# Patient Record
Sex: Male | Born: 1950 | Race: White | Hispanic: No | Marital: Single | Smoking: Former smoker
Health system: Southern US, Community
[De-identification: ages and names within clinical notes are randomized; demographics above are authoritative.]

## PROBLEM LIST (undated history)

## (undated) DIAGNOSIS — I4891 Unspecified atrial fibrillation: Secondary | ICD-10-CM

## (undated) DIAGNOSIS — I10 Essential (primary) hypertension: Secondary | ICD-10-CM

## (undated) DIAGNOSIS — E109 Type 1 diabetes mellitus without complications: Secondary | ICD-10-CM

## (undated) DIAGNOSIS — F1910 Other psychoactive substance abuse, uncomplicated: Secondary | ICD-10-CM

## (undated) DIAGNOSIS — M1990 Unspecified osteoarthritis, unspecified site: Secondary | ICD-10-CM

## (undated) DIAGNOSIS — D649 Anemia, unspecified: Secondary | ICD-10-CM

## (undated) DIAGNOSIS — I639 Cerebral infarction, unspecified: Secondary | ICD-10-CM

## (undated) DIAGNOSIS — N189 Chronic kidney disease, unspecified: Secondary | ICD-10-CM

## (undated) DIAGNOSIS — J189 Pneumonia, unspecified organism: Secondary | ICD-10-CM

## (undated) DIAGNOSIS — C182 Malignant neoplasm of ascending colon: Secondary | ICD-10-CM

## (undated) DIAGNOSIS — N402 Nodular prostate without lower urinary tract symptoms: Secondary | ICD-10-CM

## (undated) DIAGNOSIS — I509 Heart failure, unspecified: Secondary | ICD-10-CM

## (undated) HISTORY — DX: Unspecified atrial fibrillation: I48.91

## (undated) HISTORY — DX: Pneumonia, unspecified organism: J18.9

## (undated) HISTORY — DX: Type 1 diabetes mellitus without complications: E10.9

## (undated) HISTORY — DX: Nodular prostate without lower urinary tract symptoms: N40.2

## (undated) HISTORY — DX: Malignant neoplasm of ascending colon: C18.2

## (undated) HISTORY — DX: Other psychoactive substance abuse, uncomplicated: F19.10

## (undated) HISTORY — PX: OTHER SURGICAL HISTORY: SHX169

## (undated) HISTORY — PX: TONSILLECTOMY: SUR1361

## (undated) HISTORY — PX: PORT-A-CATH REMOVAL: SHX5289

## (undated) HISTORY — DX: Anemia, unspecified: D64.9

## (undated) HISTORY — DX: Heart failure, unspecified: I50.9

---
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
## 2000-05-23 ENCOUNTER — Ambulatory Visit (HOSPITAL_COMMUNITY): Admission: RE | Admit: 2000-05-23 | Discharge: 2000-05-23 | Payer: Self-pay | Admitting: Ophthalmology

## 2005-03-07 ENCOUNTER — Ambulatory Visit (HOSPITAL_COMMUNITY): Admission: RE | Admit: 2005-03-07 | Discharge: 2005-03-07 | Payer: Self-pay | Admitting: Internal Medicine

## 2005-03-07 ENCOUNTER — Ambulatory Visit: Payer: Self-pay | Admitting: Internal Medicine

## 2007-04-19 HISTORY — PX: FINGER SURGERY: SHX640

## 2011-05-10 ENCOUNTER — Encounter: Payer: Self-pay | Admitting: Gastroenterology

## 2011-05-10 ENCOUNTER — Ambulatory Visit (INDEPENDENT_AMBULATORY_CARE_PROVIDER_SITE_OTHER): Payer: BC Managed Care – PPO | Admitting: Gastroenterology

## 2011-05-10 VITALS — BP 118/70 | HR 80 | Ht 75.0 in | Wt 166.0 lb

## 2011-05-10 DIAGNOSIS — H409 Unspecified glaucoma: Secondary | ICD-10-CM | POA: Insufficient documentation

## 2011-05-10 DIAGNOSIS — R195 Other fecal abnormalities: Secondary | ICD-10-CM

## 2011-05-10 DIAGNOSIS — E1065 Type 1 diabetes mellitus with hyperglycemia: Secondary | ICD-10-CM | POA: Insufficient documentation

## 2011-05-10 MED ORDER — PEG-KCL-NACL-NASULF-NA ASC-C 100 G PO SOLR: 1.0000 | Freq: Once | ORAL | Status: DC

## 2011-05-10 NOTE — Progress Notes (Signed)
History of Present Illness: [name] is a pleasant 61 year old white male with history of type 1 diabetes and glaucoma referred at the request of Dr. Christell Constant for evaluation of anemia and Hemoccult-positive stool. For the past 2 months he has been complaining of mild lower abdominal discomfort. At evaluation in Dr. Kathi Der office he was noted to be Hemoccult-positive. Hemoglobin was 10.9 and MCV 79. Ferritin level was 40 and percent saturation 12. He has noted some irregularity of his bowels. He denies melena or hematochezia. He is on no gastric irritants. He has no prior GI complaints.    Past Medical History  Diagnosis Date  . Diabetes mellitus   . Anemia   . Glaucoma   . Pneumonia   . Allergic rhinitis   . Prostate nodule    Past Surgical History  Procedure Date  . Tonsillectomy   . Finger surgery     right middle   family history includes Breast cancer in his mother; Colon polyps in his father; Diabetes in his father; Lung cancer in his father; and Pancreatitis in his mother. Current Outpatient Prescriptions  Medication Sig Dispense Refill  . Ascorbic Acid (VITAMIN C PO) Take 1 tablet by mouth daily.      . Aspirin (ECOTRIN PO) Take 1 tablet by mouth daily.      . brimonidine-timolol (COMBIGAN) 0.2-0.5 % ophthalmic solution Place 1 drop into both eyes daily.      . insulin aspart (NOVOLOG) 100 UNIT/ML injection Inject into the skin. Take 1 unit for ever 6 grams of carbs before each meal      . insulin glargine (LANTUS) 100 UNIT/ML injection Inject 22 Units into the skin at bedtime.      Marland Kitchen latanoprost (XALATAN) 0.005 % ophthalmic solution Place 1 drop into both eyes at bedtime.      Marland Kitchen levocetirizine (XYZAL) 5 MG tablet Take 5 mg by mouth every evening.      Marland Kitchen LISINOPRIL PO Take 1 tablet by mouth daily.      . Misc. Devices (NASADOCK) MISC Place 1 spray into both nostrils daily.      . Multiple Vitamin (MULTIVITAMIN) tablet Take 1 tablet by mouth daily.      Marland Kitchen SIMVASTATIN PO Take  1 tablet by mouth daily.      Marland Kitchen VITAMIN D, CHOLECALCIFEROL, PO Take 1 tablet by mouth daily.       Allergies as of 05/10/2011  . (No Known Allergies)    reports that he quit smoking about 18 years ago. He has never used smokeless tobacco. He reports that he drinks alcohol. He reports that he uses illicit drugs.     Review of Systems: Pertinent positive and negative review of systems were noted in the above HPI section. All other review of systems were otherwise negative.  Vital signs were reviewed in today's medical record Physical Exam: General: Well developed , well nourished, no acute distress Head: Normocephalic and atraumatic Eyes:  sclerae anicteric, EOMI Ears: Normal auditory acuity Mouth: No deformity or lesions Neck: Supple, no masses or thyromegaly Lungs: Clear throughout to auscultation Heart: Regular rate and rhythm; no murmurs, rubs or bruits Abdomen: Soft, non tender and non distended. No masses, hepatosplenomegaly or hernias noted. Normal Bowel sounds Rectal:deferred Musculoskeletal: Symmetrical with no gross deformities  Skin: No lesions on visible extremities Pulses:  Normal pulses noted Extremities: No clubbing, cyanosis, edema or deformities noted; there are chronic venous stasis changes in his lower extremities Neurological: Alert oriented x 4, grossly nonfocal Cervical  Nodes:  No significant cervical adenopathy Inguinal Nodes: No significant inguinal adenopathy Psychological:  Alert and cooperative. Normal mood and affect

## 2011-05-10 NOTE — Patient Instructions (Signed)
Your Colonoscopy is scheduled on 05/16/2011 at 2pm Separate instructions have been given We have sent your MoviPrep to your pharmacy We have instructed you on your diabetic medications  Colonoscopy A colonoscopy is an exam to evaluate your entire colon. In this exam, your colon is cleansed. A long fiberoptic tube is inserted through your rectum and into your colon. The fiberoptic scope (endoscope) is a long bundle of enclosed and very flexible fibers. These fibers transmit light to the area examined and send images from that area to your caregiver. Discomfort is usually minimal. You may be given a drug to help you sleep (sedative) during or prior to the procedure. This exam helps to detect lumps (tumors), polyps, inflammation, and areas of bleeding. Your caregiver may also take a small piece of tissue (biopsy) that will be examined under a microscope. LET YOUR CAREGIVER KNOW ABOUT:   Allergies to food or medicine.   Medicines taken, including vitamins, herbs, eyedrops, over-the-counter medicines, and creams.   Use of steroids (by mouth or creams).   Previous problems with anesthetics or numbing medicines.   History of bleeding problems or blood clots.   Previous surgery.   Other health problems, including diabetes and kidney problems.   Possibility of pregnancy, if this applies.  BEFORE THE PROCEDURE   A clear liquid diet may be required for 2 days before the exam.   Ask your caregiver about changing or stopping your regular medications.   Liquid injections (enemas) or laxatives may be required.   A large amount of electrolyte solution may be given to you to drink over a short period of time. This solution is used to clean out your colon.   You should be present 60 minutes prior to your procedure or as directed by your caregiver.  AFTER THE PROCEDURE   If you received a sedative or pain relieving medication, you will need to arrange for someone to drive you home.   Occasionally,  there is a little blood passed with the first bowel movement. Do not be concerned.  FINDING OUT THE RESULTS OF YOUR TEST Not all test results are available during your visit. If your test results are not back during the visit, make an appointment with your caregiver to find out the results. Do not assume everything is normal if you have not heard from your caregiver or the medical facility. It is important for you to follow up on all of your test results. HOME CARE INSTRUCTIONS   It is not unusual to pass moderate amounts of gas and experience mild abdominal cramping following the procedure. This is due to air being used to inflate your colon during the exam. Walking or a warm pack on your belly (abdomen) may help.   You may resume all normal meals and activities after sedatives and medicines have worn off.   Only take over-the-counter or prescription medicines for pain, discomfort, or fever as directed by your caregiver. Do not use aspirin or blood thinners if a biopsy was taken. Consult your caregiver for medicine usage if biopsies were taken.  SEEK IMMEDIATE MEDICAL CARE IF:   You have a fever.   You pass large blood clots or fill a toilet with blood following the procedure. This may also occur 10 to 14 days following the procedure. This is more likely if a biopsy was taken.   You develop abdominal pain that keeps getting worse and cannot be relieved with medicine.  Document Released: 04/01/2000 Document Revised: 12/15/2010 Document Reviewed: 11/15/2007  ExitCare Patient Information 2012 [place].

## 2011-05-10 NOTE — Assessment & Plan Note (Addendum)
Hemoccult-positive stool, probable iron deficiency anemia and lower abdominal pain suggest chronic GI bleeding, possibly from a neoplasm  Recommendations #1 colonoscopy #2 upper endoscopy if colonoscopy is not diagnostic

## 2011-05-16 ENCOUNTER — Other Ambulatory Visit: Payer: Self-pay | Admitting: Gastroenterology

## 2011-05-16 ENCOUNTER — Encounter: Payer: Self-pay | Admitting: Gastroenterology

## 2011-05-16 ENCOUNTER — Telehealth: Payer: Self-pay

## 2011-05-16 ENCOUNTER — Ambulatory Visit (AMBULATORY_SURGERY_CENTER): Payer: BC Managed Care – PPO | Admitting: Gastroenterology

## 2011-05-16 ENCOUNTER — Other Ambulatory Visit (INDEPENDENT_AMBULATORY_CARE_PROVIDER_SITE_OTHER): Payer: BC Managed Care – PPO

## 2011-05-16 VITALS — BP 151/90 | HR 65 | Temp 96.3°F | Resp 14 | Ht 75.0 in | Wt 166.0 lb

## 2011-05-16 DIAGNOSIS — K6389 Other specified diseases of intestine: Secondary | ICD-10-CM

## 2011-05-16 DIAGNOSIS — C180 Malignant neoplasm of cecum: Secondary | ICD-10-CM

## 2011-05-16 DIAGNOSIS — R195 Other fecal abnormalities: Secondary | ICD-10-CM

## 2011-05-16 LAB — BASIC METABOLIC PANEL
Chloride: 107 mEq/L (ref 96–112)
Creatinine, Ser: 0.9 mg/dL (ref 0.4–1.5)
Sodium: 142 mEq/L (ref 135–145)

## 2011-05-16 MED ORDER — SODIUM CHLORIDE 0.9 % IV SOLN: 500.0000 mL | INTRAVENOUS | Status: DC

## 2011-05-16 NOTE — Op Note (Signed)
[facility] [address] [place], [place]  [address]  COLONOSCOPY PROCEDURE REPORT  PATIENT:  Greg Robertson, Greg Robertson  MR#:  [id number] BIRTHDATE:  11-22-1950, 60 yrs. old  GENDER:  male ENDOSCOPIST:  Barbette Hair. Arlyce Dice, MD REF. BY:  Rudi Heap, M.D. PROCEDURE DATE:  05/16/2011 PROCEDURE:  Colonoscopy with biopsy ASA CLASS:  Class II INDICATIONS:  heme positive stool, Iron deficiency anemia MEDICATIONS:   MAC sedation, administered by CRNA propofol 200mg IV  DESCRIPTION OF PROCEDURE:   After the risks benefits and alternatives of the procedure were thoroughly explained, informed consent was obtained.  Digital rectal exam was performed and revealed no abnormalities.   The LB 180AL [id number] endoscope was introduced through the anus and advanced to the cecum, which was identified by the ileocecal valve, without limitations.  The quality of the prep was excellent, using MoviPrep.  The instrument was then slowly withdrawn as the colon was fully examined. <<PROCEDUREIMAGES>>  FINDINGS:  A mass was found in the cecum. Large, friable, ulcerated apple-core lesion encompassing the cecum and proximal ascending colon. Bxs taken (see image1).  Otherwise normal colonoscopy without other polyps, masses, vascular ectasias, or inflammatory changes (see image2).   Retroflexed views in the rectum revealed no abnormalities.    The time to cecum =  1) 2.50 minutes. The scope was then withdrawn in  1) 6.50  minutes from the cecum and the procedure completed. COMPLICATIONS:  None ENDOSCOPIC IMPRESSION: 1) Mass in the cecum 2) Otherwise normal colonoscopy RECOMMENDATIONS: 1) My office will arrange for you to have a CT scan of abdomen and pelvis. 2) My office will arrange for you to meet with a surgeon. REPEAT EXAM:  In 1 year(s) for Colonoscopy.  ______________________________ Barbette Hair. Arlyce Dice, MD  CC:  Karie Soda, MDBradley Truett Perna, MD  n. Rosalie Doctor:   Barbette Hair. Challen Spainhour at 05/16/2011  02:21 PM  Zollie Beckers, [phone]

## 2011-05-16 NOTE — Telephone Encounter (Signed)
Pt scheduled for CT of abdomen and pelvis 05/19/11 at [facility] CT arrival time 9:45am for a 10am appt time. Pt to drink 1 bottle of contrast at 8am, and the 2nd bottle at 9am. Pt to be NPO 4 hours prior to exam. Pt to have BMET drawn today. Endo nurse to notify pt of appt date and time.

## 2011-05-16 NOTE — Progress Notes (Signed)
Patient did not experience any of the following events: a burn prior to discharge; a fall within the facility; wrong site/side/patient/procedure/implant event; or a hospital transfer or hospital admission upon discharge from the facility. (G8907) Patient did not have preoperative order for IV antibiotic SSI prophylaxis. (G8918)  

## 2011-05-16 NOTE — Patient Instructions (Signed)
FOLLOW INSTRUCTIONS ON THE BLUE AND GREEN INSTRUCTION SHEETS.  CONTINUE YOUR MEDICATIONS,  ABDOMINAL CT SCHEDULED FOR May 19, 2011 AT 1000, arrive at 945.  THIS PROCEDURE IS SCHEDULED AT [facility] [address] [place]  NOTHING TO EAT OR DRINK AFTER 600 AM ON THE DAY OF PROCEDURE.       DRINK 1 BOTTLE OF CONTRAST AT 800, 2ND BOTTLE AT 900.

## 2011-05-16 NOTE — Progress Notes (Signed)
PATIENT TO LAB. IN BASEMENT OF THIS BUILDING FOR BMP ON DISCHARGE

## 2011-05-17 ENCOUNTER — Telehealth: Payer: Self-pay | Admitting: *Deleted

## 2011-05-17 ENCOUNTER — Telehealth (INDEPENDENT_AMBULATORY_CARE_PROVIDER_SITE_OTHER): Payer: Self-pay | Admitting: Surgery

## 2011-05-17 NOTE — Telephone Encounter (Signed)
  Follow up Call-  Call back number 05/16/2011  Post procedure Call Back phone  # [phone]  Permission to leave phone message Yes     Patient questions:  Do you have a fever, pain , or abdominal swelling? no Pain Score  0 *  Have you tolerated food without any problems? yes  Have you been able to return to your normal activities? yes  Do you have any questions about your discharge instructions: Diet   no Medications  no Follow up visit  no  Do you have questions or concerns about your Care? no  Actions: * If pain score is 4 or above: No action needed, pain <4.

## 2011-05-17 NOTE — Telephone Encounter (Signed)
Linda from Dr. Marzetta Board office has called and scheduled an appointment for this patient on 05/31/11, Dr. Gordy Savers soonest available.  She states that Dr. Arlyce Dice has sent Dr. Michaell Cowing a message regarding this patient, is there any possible way that this patient can be seen sooner? Please call.Marland KitchenMarland KitchenMarland Kitchen

## 2011-05-18 ENCOUNTER — Telehealth (INDEPENDENT_AMBULATORY_CARE_PROVIDER_SITE_OTHER): Payer: Self-pay

## 2011-05-18 ENCOUNTER — Telehealth: Payer: Self-pay

## 2011-05-18 NOTE — Telephone Encounter (Signed)
Returned Linda's call about moving pt's appt up earlier from 2-12. I spoke to Dr Michaell Cowing who advised we could overbook him on 05-25-11 so I gave Bonita Quin the new appt time with Dr Michaell Cowing.

## 2011-05-18 NOTE — Telephone Encounter (Signed)
Pt aware of appt with Dr. Michaell Cowing 05/25/11 arrival time 11:30am.

## 2011-05-19 ENCOUNTER — Ambulatory Visit (INDEPENDENT_AMBULATORY_CARE_PROVIDER_SITE_OTHER)
Admission: RE | Admit: 2011-05-19 | Discharge: 2011-05-19 | Disposition: A | Discharge status: Home / Self Care | Payer: BC Managed Care – PPO | Source: Ambulatory Visit | Attending: Gastroenterology | Admitting: Gastroenterology

## 2011-05-19 ENCOUNTER — Telehealth: Payer: Self-pay | Admitting: Gastroenterology

## 2011-05-19 DIAGNOSIS — K6389 Other specified diseases of intestine: Secondary | ICD-10-CM

## 2011-05-19 IMAGING — CT CT ABD-PELV W/ CM
2 of 5 series · 15 of 46 positions shown, 17 images · IV contrast (Omnipaque 300)
Comparison: None.
COMPARISON: None.

<!--  IDXRADR:ADDEND:BEGIN -->Addendum Begins
<!--  IDXRADR:ADDEND:INNER_BEGIN -->***ADDENDUM*** CREATED: [DATE] [DATE]

Impression #1 below contains a typographical error, and should read
"ascending" colon, as stated in the Findings section.
CLINICAL DATA: Right lower quadrant pain.  Lower GI bleeding.
Colonic carcinoma newly diagnosed by colonoscopy.
CT ABDOMEN AND PELVIS WITH CONTRAST
TECHNIQUE: Multidetector CT imaging of the abdomen and pelvis was
performed following the standard protocol during bolus
administration of intravenous contrast.
Contrast: 100mL OMNIPAQUE IOHEXOL 300 MG/ML IV SOLN

[Series 2: abd/ pel 5mm · axial · 0.65mm/px · z∈[-410,+0]mm · 12 of 92 slices shown, 14 images]
[im 5/92  soft-tissue]
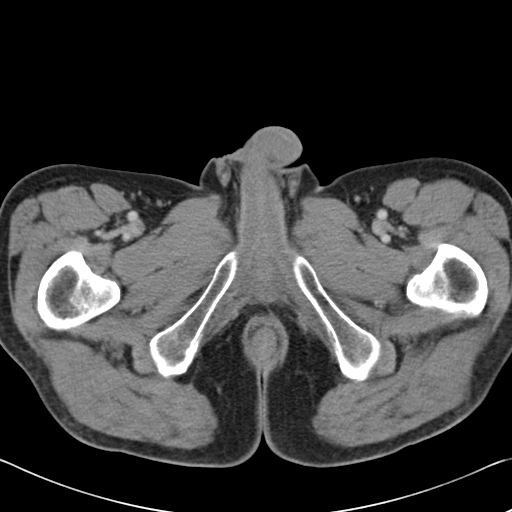
[im 5/92  bone]
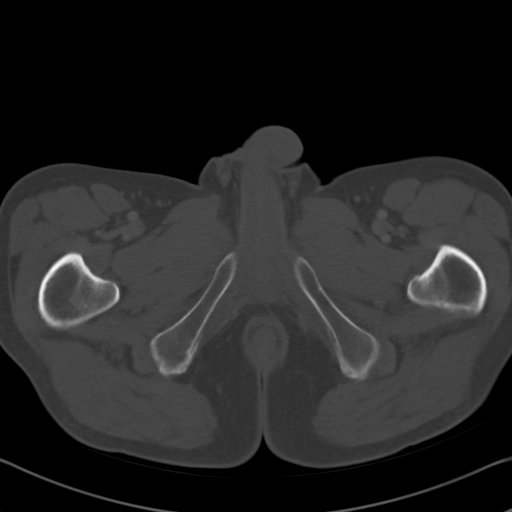
[im 15/92  soft-tissue]
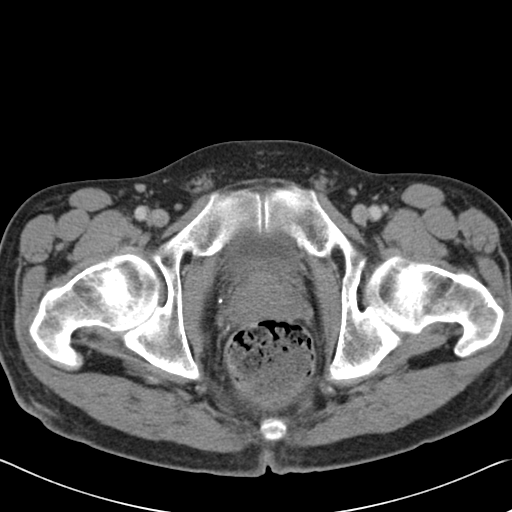
[im 20/92  soft-tissue]
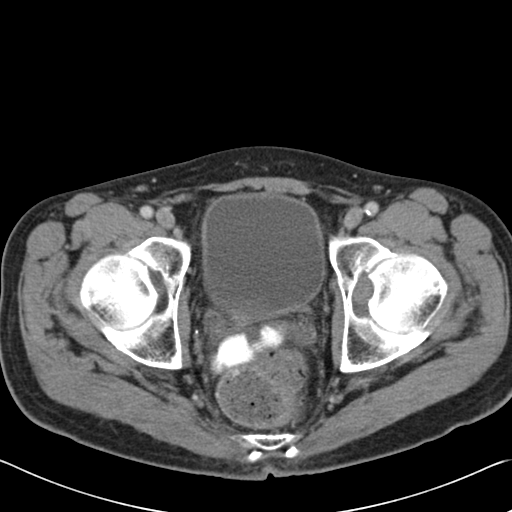
[im 29/92  soft-tissue]
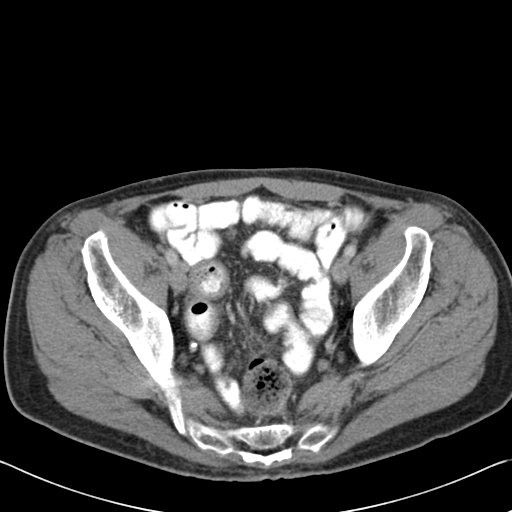
[im 34/92  soft-tissue]
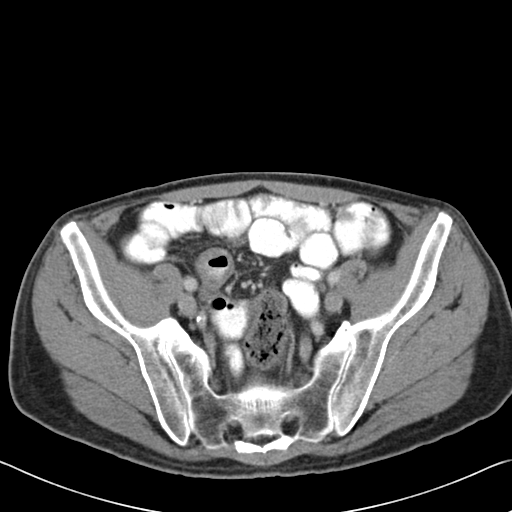
[im 44/92  soft-tissue]
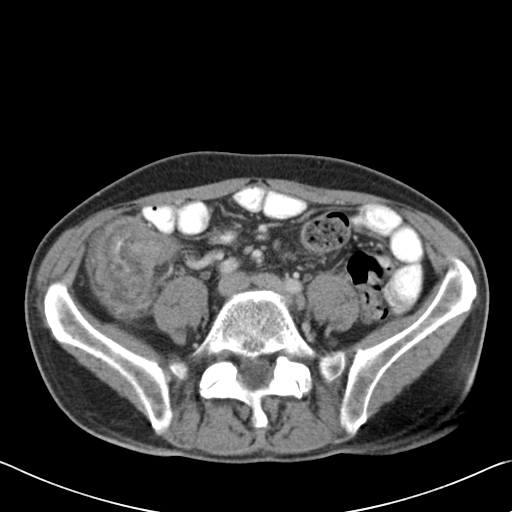
[im 48/92  soft-tissue]
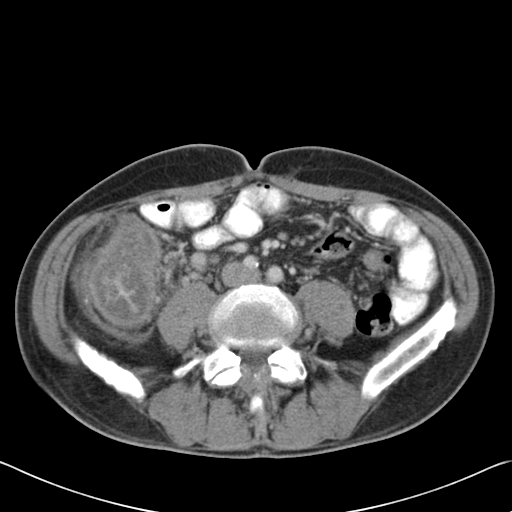
[im 58/92  soft-tissue]
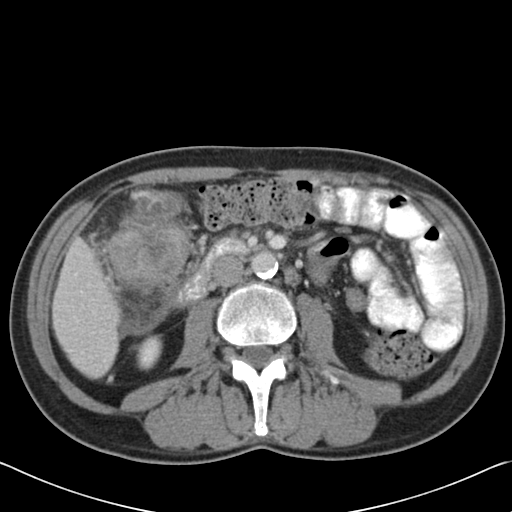
[im 63/92  soft-tissue]
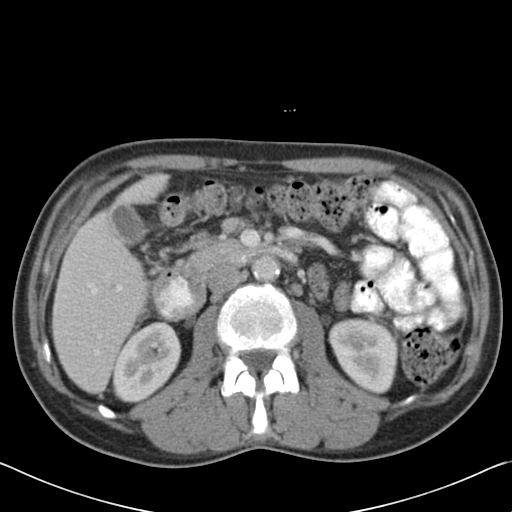
[im 63/92  bone]
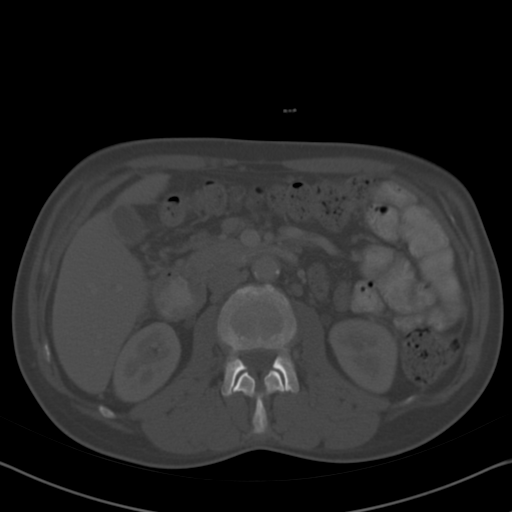
[im 72/92  soft-tissue]
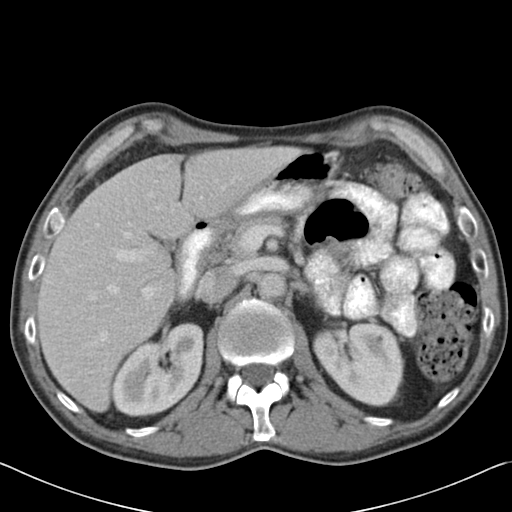
[im 77/92  soft-tissue]
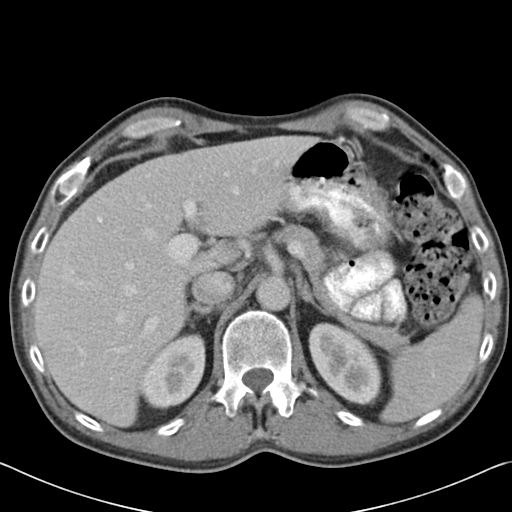
[im 87/92  soft-tissue]
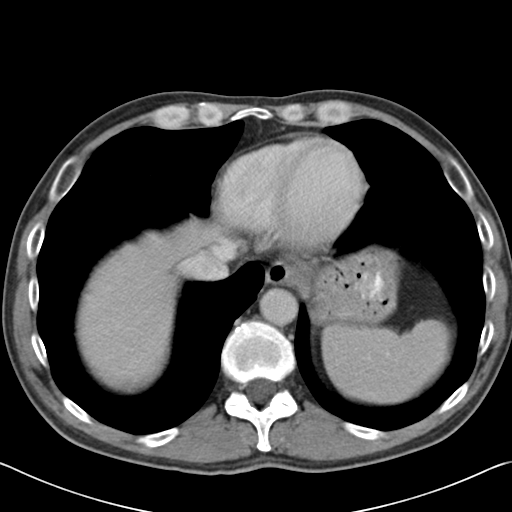

[Series 602: cor · coronal · 0.93mm/px · 3 of 100 slices shown]
[im 34/100  soft-tissue]
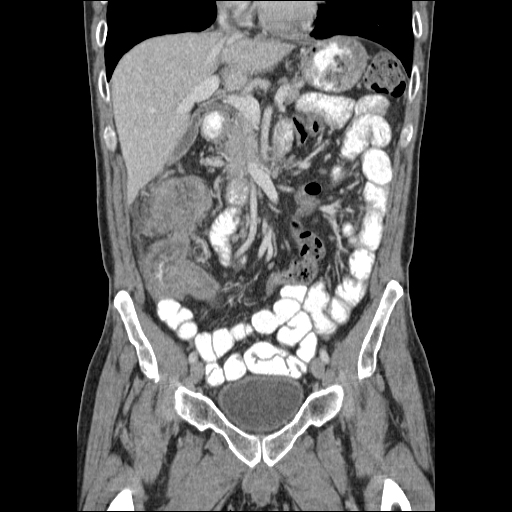
[im 45/100  soft-tissue]
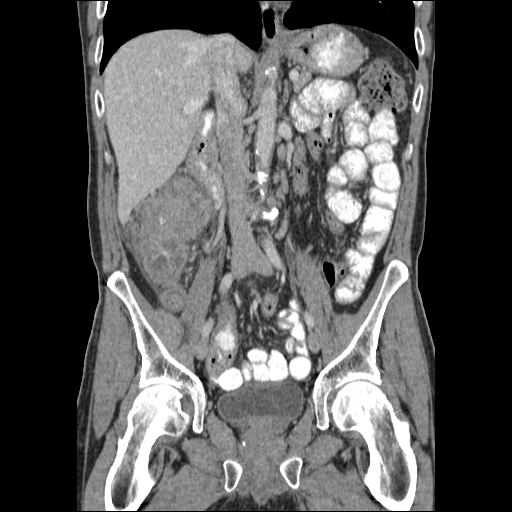
[im 56/100  soft-tissue]
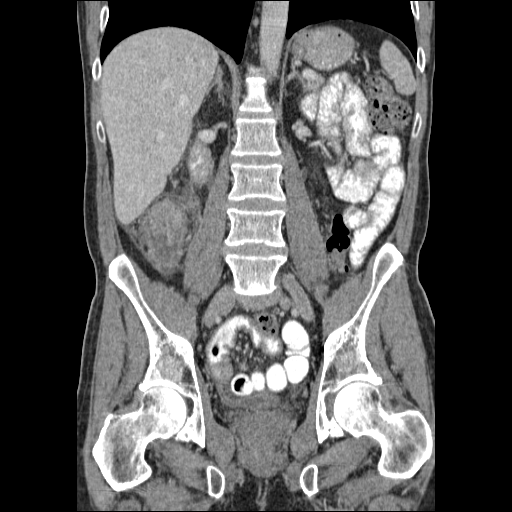

[15 of 46 positions shown; findings below may reference images not displayed]

FINDINGS: A large annular constricting mass is seen involving the
ascending colon and hepatic flexure which measures 6.4 x 7.9 cm.
There is adjacent soft tissue stranding in the pericolonic fat,
consistent with extra colonic extension. Enlarged lymph nodes are
seen in the right pericolonic fat and right abdominal mesentery
which measure up to 13 mm, consistent with metastatic disease.
Shotty less than 1 cm retroperitoneal lymph nodes are seen within
the aortocaval and left para-aortic spaces, which are nonspecific.
There is no evidence of bowel obstruction.  No evidence of ascites.

No liver masses are identified.  Gallbladder is unremarkable.  The
pancreas, spleen, adrenal glands, and kidneys are normal in
appearance.  No evidence of hydronephrosis.  No inflammatory
process or abscess identified. Visualized portions of the lung
bases are clear.
IMPRESSION: 1. 6.4 x 7.9 cm annular constricting mass involving the descending
colon and hepatic flexure, with extra colonic extension.
2.  Right pericolonic and mesenteric lymphadenopathy, consistent
with metastatic disease. Nonspecific shotty retroperitoneal lymph
nodes in the aortocaval and left para-aortic spaces.
3.  No evidence of liver metastases or other distant metastatic
disease.

<!--  IDXRADR:ADDEND:INNER_END -->Addendum Ends
<!--  IDXRADR:ADDEND:END -->*RADIOLOGY REPORT*
FINDINGS: A large annular constricting mass is seen involving the
ascending colon and hepatic flexure which measures 6.4 x 7.9 cm.
There is adjacent soft tissue stranding in the pericolonic fat,
consistent with extra colonic extension. Enlarged lymph nodes are
seen in the right pericolonic fat and right abdominal mesentery
which measure up to 13 mm, consistent with metastatic disease.
Shotty less than 1 cm retroperitoneal lymph nodes are seen within
the aortocaval and left para-aortic spaces, which are nonspecific.
There is no evidence of bowel obstruction.  No evidence of ascites.

No liver masses are identified.  Gallbladder is unremarkable.  The
pancreas, spleen, adrenal glands, and kidneys are normal in
appearance.  No evidence of hydronephrosis.  No inflammatory
process or abscess identified. Visualized portions of the lung
bases are clear.
IMPRESSION: 1. 6.4 x 7.9 cm annular constricting mass involving the descending
colon and hepatic flexure, with extra colonic extension.
2.  Right pericolonic and mesenteric lymphadenopathy, consistent
with metastatic disease. Nonspecific shotty retroperitoneal lymph
nodes in the aortocaval and left para-aortic spaces.
3.  No evidence of liver metastases or other distant metastatic
disease.

## 2011-05-19 MED ORDER — IOHEXOL 300 MG/ML  SOLN
100.0000 mL | Freq: Once | INTRAMUSCULAR | Status: AC | PRN
  Administered 2011-05-19: 100 mL via INTRAVENOUS

## 2011-05-19 NOTE — Telephone Encounter (Signed)
Pt is calling requesting the results of his CT scan. Dr. Arlyce Dice please advise.

## 2011-05-20 NOTE — Telephone Encounter (Signed)
CT shows tumor in area of colon seen by colo.  Liver looks OK

## 2011-05-20 NOTE — Telephone Encounter (Signed)
Spoke with pt and he is aware. 

## 2011-05-25 ENCOUNTER — Ambulatory Visit (INDEPENDENT_AMBULATORY_CARE_PROVIDER_SITE_OTHER): Payer: BC Managed Care – PPO | Admitting: Surgery

## 2011-05-25 ENCOUNTER — Encounter (INDEPENDENT_AMBULATORY_CARE_PROVIDER_SITE_OTHER): Payer: Self-pay | Admitting: Surgery

## 2011-05-25 VITALS — BP 146/80 | HR 70 | Temp 98.1°F | Resp 18 | Ht 75.0 in | Wt 168.6 lb

## 2011-05-25 DIAGNOSIS — Z85038 Personal history of other malignant neoplasm of large intestine: Secondary | ICD-10-CM | POA: Insufficient documentation

## 2011-05-25 DIAGNOSIS — C182 Malignant neoplasm of ascending colon: Secondary | ICD-10-CM

## 2011-05-25 HISTORY — DX: Malignant neoplasm of ascending colon: C18.2

## 2011-05-25 NOTE — Progress Notes (Signed)
Subjective:     Patient ID: Greg Robertson, male   DOB: 05/21/1950, 61 y.o.   MRN: [id number]  HPI  Daoud D Bearman  12/03/1950 [id number]  Patient Care Team: Donald Moore, MD as PCP - General (Family Medicine) Robert D Kaplan, MD as Consulting Physician (Gastroenterology)  This patient is a 61 y.o.male who presents today for surgical evaluation at the request of Dr. Kaplan.   Reason for visit: Newly diagnosed cancer and proximal colon.  The patient is a pleasant active male. He began to have some abdominal discomfort. He started Prilosec and TUMS. His appetite decreased. He was found to be anemic. He was sent for colonoscopy and endoscopy to rule out a GI tract etiology. He was found to have a large mass in his proximal colon. Biopsy showed cancer. Therefore, Dr. Kaplan sent the patient to me for evaluation.  The patient is rather active. No prior problems surgeries. He usually has a bowel movement everyday medications had some loose stools. He is insulin requiring diabetic. No major changes on his diabetic regimen. Did have a period of decreased appetite and weight loss. However he feels like he is rallying from that.  Patient Active Problem List  Diagnoses  . Nonspecific abnormal finding in stool contents  . Diabetes mellitus without mention of complication  . Glaucoma  . Cancer of ascending colon, 7cm    Past Medical History  Diagnosis Date  . Diabetes mellitus   . Anemia   . Glaucoma   . Pneumonia   . Allergic rhinitis   . Prostate nodule   . Colonic mass   . Cancer   . Substance abuse   . Cancer of ascending colon, 7cm 05/25/2011    Past Surgical History  Procedure Date  . Tonsillectomy 1957 - approximate  . Finger surgery 2009    right middle  . Nasal fracture surgery 1968    History   Social History  . Marital Status: Single    Spouse Name: N/A    Number of Children: 0  . Years of Education: N/A   Occupational History  . sales    Social History  Main Topics  . Smoking status: Former Smoker    Quit date: 04/18/1993  . Smokeless tobacco: Never Used  . Alcohol Use: Yes     1 drink every 2 days  . Drug Use: Yes     once a night marijuana  . Sexually Active: Not on file   Other Topics Concern  . Not on file   Social History Narrative  . No narrative on file    Family History  Problem Relation Age of Onset  . Colon polyps Father   . Lung cancer Father   . Diabetes Father   . Breast cancer Mother   . Pancreatitis Mother     intestinal adhesions    Current outpatient prescriptions:ACCU-CHEK AVIVA PLUS test strip, Daily., Disp: , Rfl: ;  Ascorbic Acid (VITAMIN C PO), Take 1 tablet by mouth daily., Disp: , Rfl: ;  Aspirin (ECOTRIN PO), Take 1 tablet by mouth daily., Disp: , Rfl: ;  brimonidine-timolol (COMBIGAN) 0.2-0.5 % ophthalmic solution, Place 1 drop into both eyes daily., Disp: , Rfl:  insulin aspart (NOVOLOG) 100 UNIT/ML injection, Inject into the skin. Take 1 unit for ever 6 grams of carbs before each meal, Disp: , Rfl: ;  insulin glargine (LANTUS) 100 UNIT/ML injection, Inject 22 Units into the skin at bedtime., Disp: , Rfl: ;  latanoprost (XALATAN) 0.005 %   ophthalmic solution, Place 1 drop into both eyes at bedtime., Disp: , Rfl: ;  levocetirizine (XYZAL) 5 MG tablet, Take 5 mg by mouth every evening., Disp: , Rfl:  LISINOPRIL PO, Take 1 tablet by mouth daily., Disp: , Rfl: ;  Misc. Devices (NASADOCK) MISC, Place 1 spray into both nostrils daily., Disp: , Rfl: ;  Multiple Vitamin (MULTIVITAMIN) tablet, Take 1 tablet by mouth daily., Disp: , Rfl: ;  SIMVASTATIN PO, Take 1 tablet by mouth daily., Disp: , Rfl: ;  VITAMIN D, CHOLECALCIFEROL, PO, Take 1 tablet by mouth daily., Disp: , Rfl:  Current facility-administered medications:0.9 %  sodium chloride infusion, 500 mL, Intravenous, Continuous, Robert D Kaplan, MD  No Known Allergies  BP 146/80  Pulse 70  Temp(Src) 98.1 F (36.7 C) (Temporal)  Resp 18  Ht 6' 3" (1.905 m)   Wt 168 lb 9.6 oz (76.476 kg)  BMI 21.07 kg/m2     Review of Systems  Constitutional: Positive for appetite change and unexpected weight change. Negative for fever, chills and diaphoresis.       5-10lbs weight loss.  Regaining it back  HENT: Negative for nosebleeds, sore throat, facial swelling, mouth sores, trouble swallowing and ear discharge.   Eyes: Negative for photophobia, discharge and visual disturbance.  Respiratory: Negative for choking, chest tightness, shortness of breath and stridor.   Cardiovascular: Negative for chest pain and palpitations.       Patient walks 60 minutes for about 2-3 miles without difficulty.  No exertional chest/neck/shoulder/arm pain.   Gastrointestinal: Negative for nausea, vomiting, abdominal pain, diarrhea, constipation, blood in stool, abdominal distention, anal bleeding and rectal pain.  Genitourinary: Negative for dysuria, urgency, difficulty urinating and testicular pain.  Musculoskeletal: Negative for myalgias, back pain, arthralgias and gait problem.  Skin: Negative for color change, pallor, rash and wound.  Neurological: Negative for dizziness, speech difficulty, weakness, numbness and headaches.  Hematological: Negative for adenopathy. Does not bruise/bleed easily.  Psychiatric/Behavioral: Negative for hallucinations, confusion and agitation.       Objective:   Physical Exam  Constitutional: He is oriented to person, place, and time. He appears well-developed and well-nourished. No distress.  HENT:  Head: Normocephalic.  Mouth/Throat: Oropharynx is clear and moist. No oropharyngeal exudate.  Eyes: Conjunctivae and EOM are normal. Pupils are equal, round, and reactive to light. No scleral icterus.  Neck: Normal range of motion. Neck supple. No tracheal deviation present.  Cardiovascular: Normal rate, regular rhythm and intact distal pulses.   Pulmonary/Chest: Effort normal and breath sounds normal. No respiratory distress.  Abdominal:  Soft. He exhibits mass. He exhibits no distension. There is no tenderness. There is no rebound and no guarding. Hernia confirmed negative in the right inguinal area and confirmed negative in the left inguinal area.       Fullness right abd wall, probable mass  Genitourinary: Penis normal. No penile tenderness.  Musculoskeletal: Normal range of motion. He exhibits no tenderness.  Lymphadenopathy:    He has no cervical adenopathy.       Right: No inguinal adenopathy present.       Left: No inguinal adenopathy present.  Neurological: He is alert and oriented to person, place, and time. No cranial nerve deficit. He exhibits normal muscle tone. Coordination normal.  Skin: Skin is warm and dry. No rash noted. He is not diaphoretic. No erythema. No pallor.  Psychiatric: He has a normal mood and affect. His behavior is normal. Judgment and thought content normal.         Assessment:     Large cancer of ascending colon     Plan:     He require surgery to remove that segment of his colon. He is thin without operations was a good laparoscopic candidate. However the tumor is large, so we'll see how large the extraction incision has to be. It is reasonable to start laparoscopically. He wishes to proceed. We'll work to coordinate this soon to avoid any delay.  The anatomy & physiology of the digestive tract was discussed.  The pathophysiology was discussed.  Natural history risks without surgery was discussed.   I feel the risks of no intervention will lead to serious problems that outweigh the operative risks; therefore, I recommended a partial colectomy to remove the pathology.  Laparoscopic & open techniques were discussed.   Risks such as bleeding, infection, abscess, leak, reoperation, possible ostomy, hernia, heart attack, death, and other risks were discussed.  I noted a good likelihood this will help address the problem.   Goals of post-operative recovery were discussed as well.  We will work to  minimize complications.  An educational handout on the pathology was given as well.  Questions were answered.  The patient expresses understanding & wishes to proceed with surgery.       

## 2011-05-25 NOTE — Patient Instructions (Signed)
Cancer of the Colon, Treatment by Resection You and your caregiver have decided that surgical removal of your colon cancer is the best form of treatment for you. Your surgeon or surgeons will do their best to remove your entire tumor. To do this, some normal tissue must also be removed to give you the best chance for a cure. The following will help describe what happens when you have this surgery. TREATMENT  Surgery is the most common treatment for colorectal cancer. It is a type of local therapy. It treats the cancer in the colon or rectum and the area close to the tumor by removing the tumor and some of the healthy tissue around it. For larger cancers, your surgeon must make an cut (incision) into the belly (abdomen) so he or she can see the area of the tumor and remove it as well as part of the healthy colon or rectum. Some nearby lymph nodes also may be removed. The surgeon checks the rest of the abdomen, the intestine and the liver to see if the cancer has spread. When a section of the colon or rectum is removed, the surgeon can usually reconnect the healthy parts. However, sometimes reconnection is not possible. In this case, the surgeon creates a new path for waste to leave the body. The surgeon makes an opening (a stoma) in the wall of the abdomen. The upper end of the intestine is then connected to the stoma. The other end is closed. The operation to create the stoma is called a colostomy. A flat bag fits over the stoma to collect waste, and a special adhesive holds it in place.  Some colostomies are temporary. The colostomy is needed only until the colon or rectum heals from surgery. After healing takes place, the surgeon reconnects the parts of the intestine and closes the stoma. Other patients need a permanent colostomy.  ASK YOUR CAREGIVER THESE QUESTIONS BEFORE HAVING SURGERY:  What kind of operation do you recommend for me?   Do I need any lymph nodes removed? Will other tissues be removed?  Why?   What are the risks of surgery? Will I have any lasting side effects?   Will I need a colostomy? If so, will it be permanent?   How will I feel after the operation?   If I have pain, how will it be controlled?   How long will I be in the hospital?   When can I get back to my normal activities?  FOLLOW-UP CARE  Follow-up care after treatment for colorectal cancer is important. Even when the cancer seems to have been completely removed or destroyed, the disease sometimes returns. Undetected cancer cells may still remain somewhere in the body after treatment. The doctor keeps checking the person's recovery and checks for recurrence of the cancer. Recurrence means that the cancer comes back.  Checkups help make sure that changes in health are found. Checkups may include:  A physical exam (including a digital rectal exam). This means your caregiver checks you to see if there are any abnormal changes they can see or feel.   Lab tests (including fecal occult blood test and CEA test) may be done. The "fecal occult blood test" checks for blood in the stool. The CEA (carcinoembryonic antigen) is a blood test that looks for a marker of colon cancer in the blood.   A colonoscopy is a test where your caregiver examines your colon with a flexible instrument like a thin telescope which looks at the inside   of the large bowel.   Other specialized x-rays, CT scans, or other tests may be performed.  Between scheduled visits you should contact your caregivers as soon as any health problems appear. Document Released: 04/07/2003 Document Revised: 12/15/2010 Document Reviewed: 07/31/2007 Exit[employer] Patient Information 2012 [employer]. 

## 2011-05-26 ENCOUNTER — Other Ambulatory Visit: Payer: Self-pay | Admitting: Gastroenterology

## 2011-05-26 DIAGNOSIS — C189 Malignant neoplasm of colon, unspecified: Secondary | ICD-10-CM

## 2011-05-27 ENCOUNTER — Encounter (HOSPITAL_COMMUNITY): Payer: Self-pay | Admitting: Pharmacy Technician

## 2011-05-30 ENCOUNTER — Ambulatory Visit (HOSPITAL_COMMUNITY)
Admission: RE | Admit: 2011-05-30 | Discharge: 2011-05-30 | Disposition: A | Discharge status: Home / Self Care | Payer: BC Managed Care – PPO | Source: Ambulatory Visit | Attending: Surgery | Admitting: Surgery

## 2011-05-30 ENCOUNTER — Encounter (HOSPITAL_COMMUNITY)
Admission: RE | Admit: 2011-05-30 | Discharge: 2011-05-30 | Disposition: A | Discharge status: Home / Self Care | Payer: BC Managed Care – PPO | Source: Ambulatory Visit | Attending: Surgery | Admitting: Surgery

## 2011-05-30 ENCOUNTER — Other Ambulatory Visit: Payer: Self-pay

## 2011-05-30 ENCOUNTER — Encounter (HOSPITAL_COMMUNITY): Payer: Self-pay

## 2011-05-30 DIAGNOSIS — Z01810 Encounter for preprocedural cardiovascular examination: Secondary | ICD-10-CM | POA: Insufficient documentation

## 2011-05-30 DIAGNOSIS — Z01818 Encounter for other preprocedural examination: Secondary | ICD-10-CM | POA: Insufficient documentation

## 2011-05-30 DIAGNOSIS — Z01812 Encounter for preprocedural laboratory examination: Secondary | ICD-10-CM | POA: Insufficient documentation

## 2011-05-30 HISTORY — DX: Essential (primary) hypertension: I10

## 2011-05-30 LAB — CBC
MCHC: 31.8 g/dL (ref 30.0–36.0)
Platelets: 252 10*3/uL (ref 150–400)
RDW: 16.4 % — ABNORMAL HIGH (ref 11.5–15.5)
WBC: 6.2 10*3/uL (ref 4.0–10.5)

## 2011-05-30 LAB — BASIC METABOLIC PANEL
BUN: 17 mg/dL (ref 6–23)
Calcium: 9.5 mg/dL (ref 8.4–10.5)
Creatinine, Ser: 0.9 mg/dL (ref 0.50–1.35)
GFR calc Af Amer: >90 mL/min (ref >90)
GFR calc non Af Amer: >90 mL/min (ref >90)

## 2011-05-30 LAB — SURGICAL PCR SCREEN
MRSA, PCR: NEGATIVE
Staphylococcus aureus: NEGATIVE

## 2011-05-30 IMAGING — CR DG CHEST 2V
2 series · 2 of 2 positions shown · non-contrast
Comparison: None

CLINICAL DATA: Preop exam.

CHEST - 2 VIEW

[w chest pa]
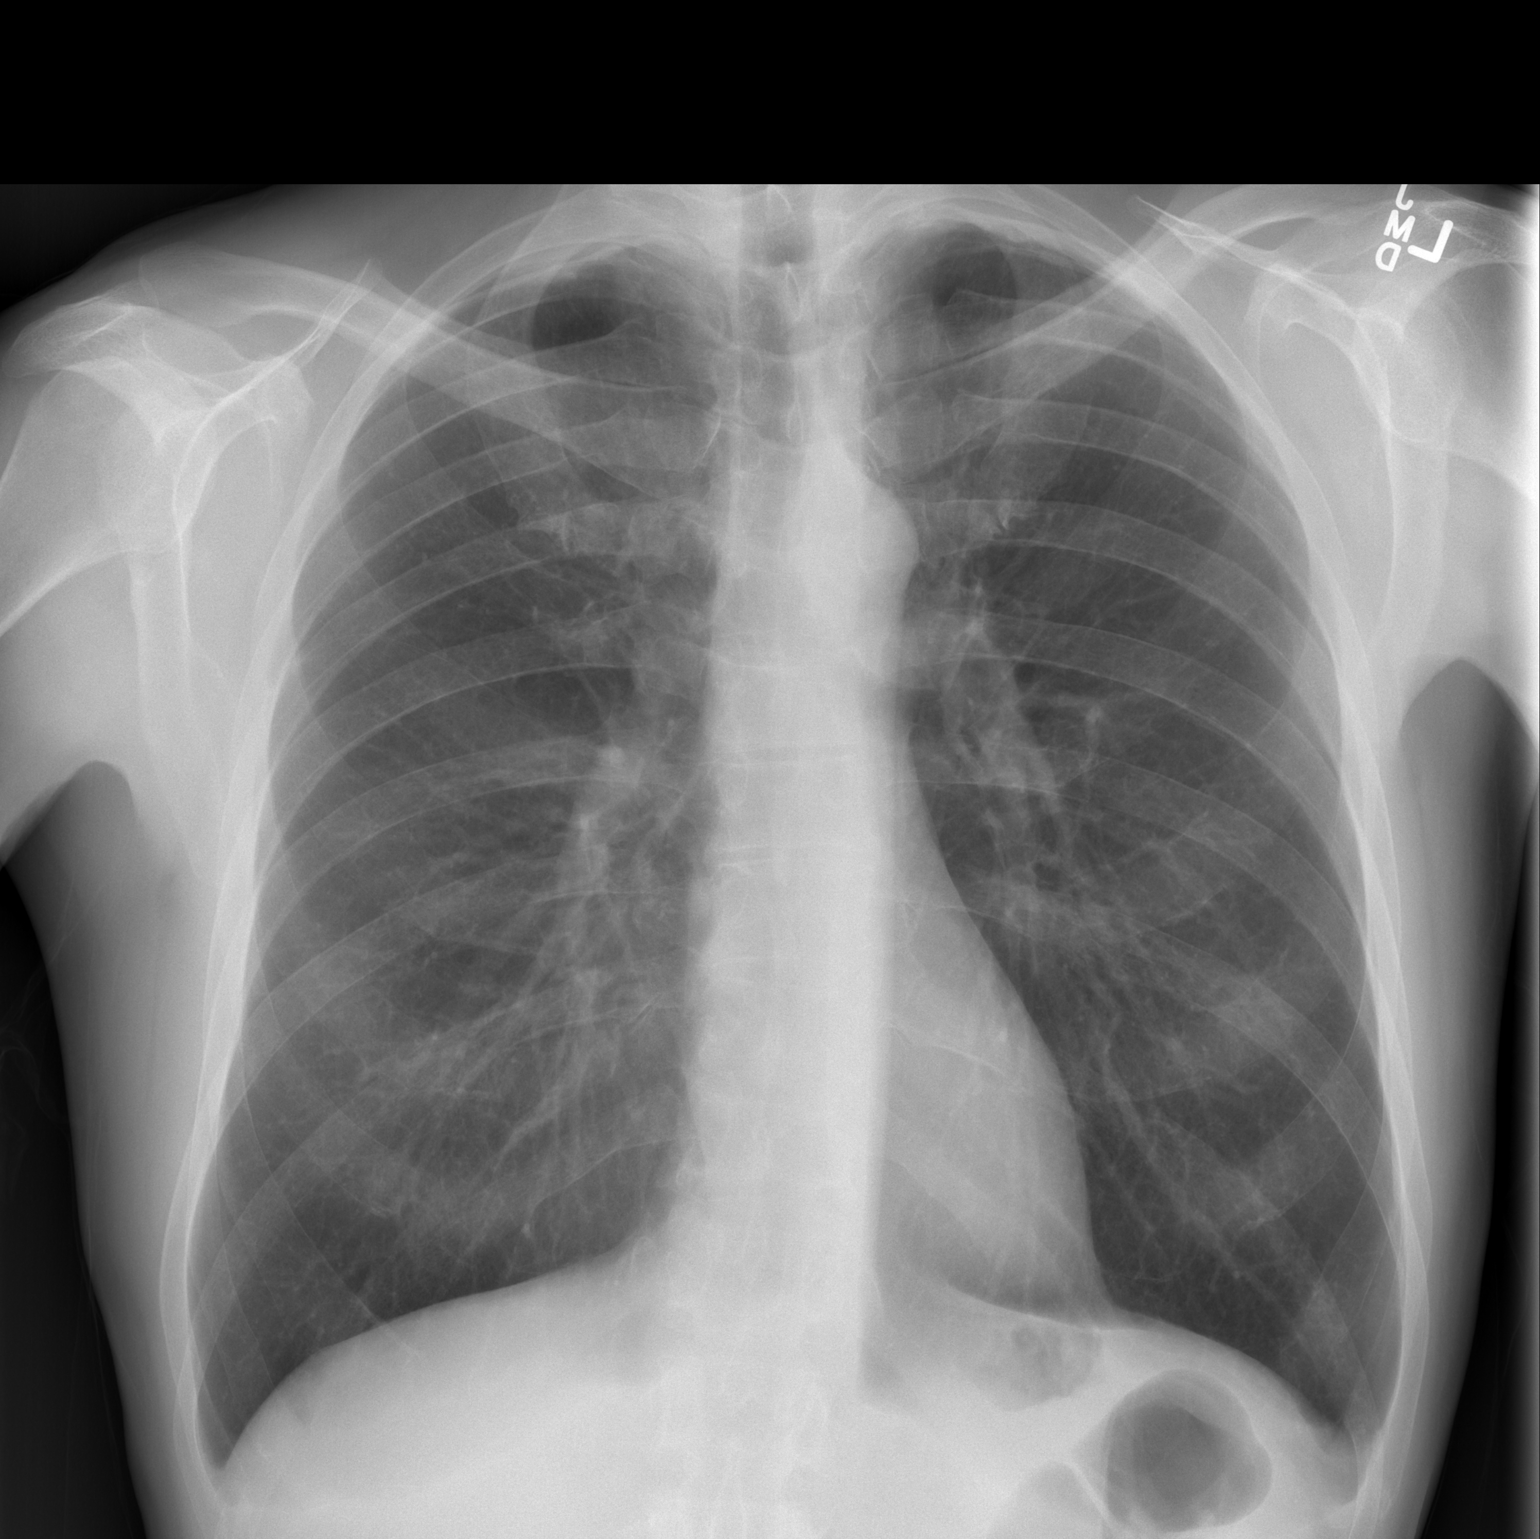

[w chest lat]
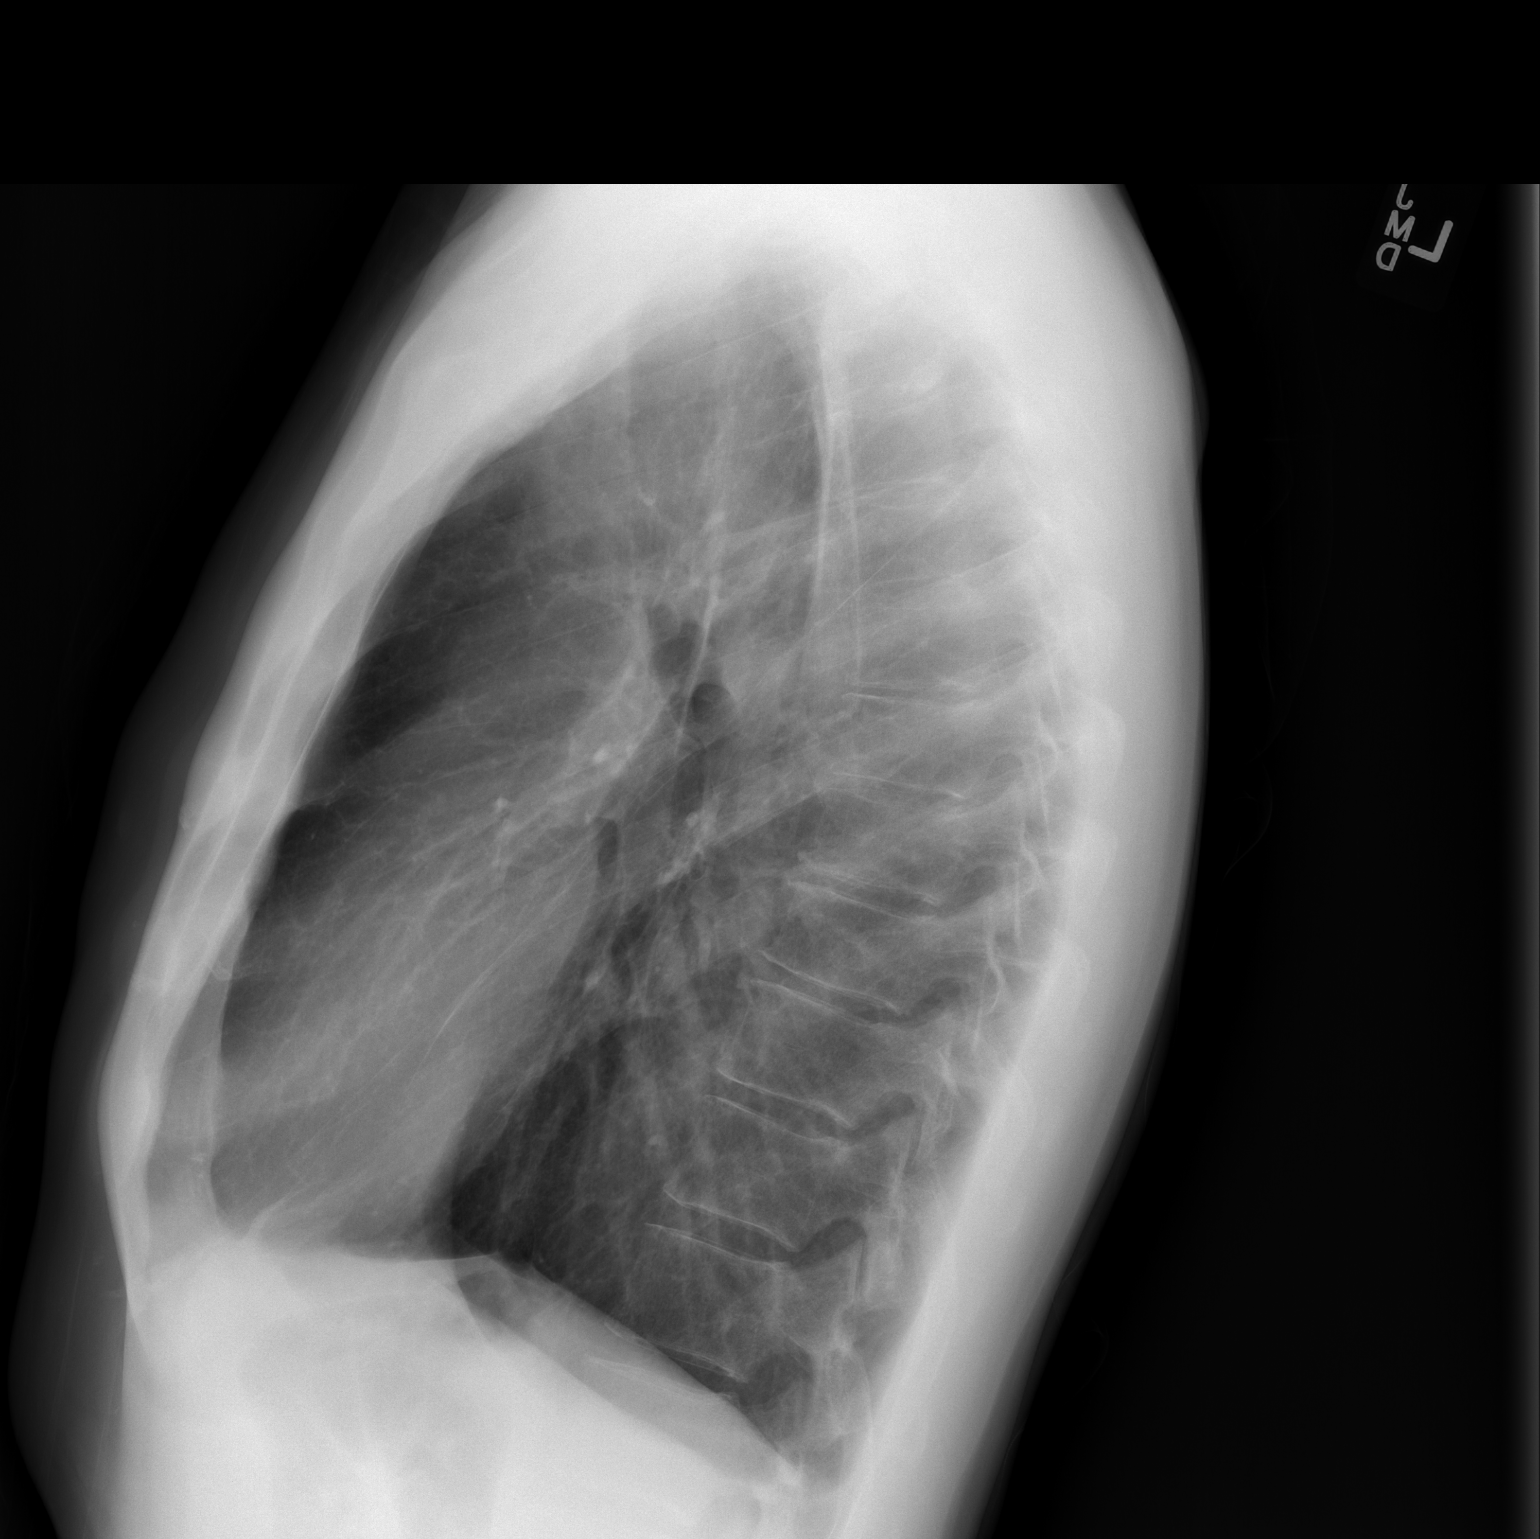

[2 of 2 positions shown; findings below may reference images not displayed]

FINDINGS: The heart size and mediastinal contours are within normal
limits.  Both lungs are clear.  The visualized skeletal structures
are unremarkable.
IMPRESSION: Negative exam.

## 2011-05-30 NOTE — Patient Instructions (Addendum)
20 Greg Robertson  05/30/2011   Your procedure is scheduled on:  06-02-2011  Report to [facility] at 1100 AM.  Call this number if you have problems the morning of surgery: [phone]   Remember: take 1/2 dose bedtime lantus insulin 06-01-2011   Do not eat food:After Midnight.  May have clear liquids midnight until 0700 am , then nothing day of surgery.  Clear liquids include soda, tea, black coffee, apple or grape juice, broth.  Take these medicines the morning of surgery with A SIP OF WATER: combigan eye drop, levocetririzine, simvastatin, triamicinolone  nasal spray if needed  Do not wear jewelry,.  Do not wear lotions, powders, or perfumes. Do  not wear deodorant.    Do not bring valuables to the hospital.  Contacts, dentures or bridgework may not be worn into surgery.  Leave suitcase in the car. After surgery it may be brought to your room.  For patients admitted to the hospital, checkout time is 11:00 AM the day of discharge.   Special instructions: CHG Shower Use Special Wash: 1/2 bottle night before surgery and 1/2 bottle morning of surgery.   Please read over the following fact sheets that you were given: MRSA Information, blood fact sheet Jasmine December Latise Dilley rn [facility] pre op nurse phone number [phone]

## 2011-05-31 ENCOUNTER — Ambulatory Visit (INDEPENDENT_AMBULATORY_CARE_PROVIDER_SITE_OTHER): Payer: BC Managed Care – PPO | Admitting: Surgery

## 2011-06-01 ENCOUNTER — Telehealth: Payer: Self-pay | Admitting: *Deleted

## 2011-06-01 ENCOUNTER — Telehealth: Payer: Self-pay | Admitting: Oncology

## 2011-06-01 NOTE — Telephone Encounter (Signed)
no response from pt will fax over a letter to Dr. Selinda Michaels informing them that pt did not respond to schedule appt

## 2011-06-01 NOTE — Telephone Encounter (Signed)
Attempted to contact patient by phone without success.  Will continue to try and contact patient (by phone or while in hospital) to coordinate oncology appointments.

## 2011-06-02 ENCOUNTER — Inpatient Hospital Stay (HOSPITAL_COMMUNITY)
Admission: RE | Admit: 2011-06-02 | Discharge: 2011-06-07 | DRG: 149 | Disposition: A | Discharge status: Home / Self Care | Payer: BC Managed Care – PPO | Source: Ambulatory Visit | Attending: Surgery | Admitting: Surgery

## 2011-06-02 ENCOUNTER — Encounter (HOSPITAL_COMMUNITY): Payer: Self-pay | Admitting: Anesthesiology

## 2011-06-02 ENCOUNTER — Other Ambulatory Visit (INDEPENDENT_AMBULATORY_CARE_PROVIDER_SITE_OTHER): Payer: Self-pay | Admitting: Surgery

## 2011-06-02 ENCOUNTER — Encounter (HOSPITAL_COMMUNITY)
Admission: RE | Disposition: A | Discharge status: Home / Self Care | Payer: Self-pay | Source: Ambulatory Visit | Attending: Surgery

## 2011-06-02 ENCOUNTER — Encounter (HOSPITAL_COMMUNITY): Payer: Self-pay

## 2011-06-02 ENCOUNTER — Ambulatory Visit (HOSPITAL_COMMUNITY): Payer: BC Managed Care – PPO | Admitting: Anesthesiology

## 2011-06-02 DIAGNOSIS — C189 Malignant neoplasm of colon, unspecified: Secondary | ICD-10-CM

## 2011-06-02 DIAGNOSIS — H409 Unspecified glaucoma: Secondary | ICD-10-CM | POA: Diagnosis present

## 2011-06-02 DIAGNOSIS — Z87891 Personal history of nicotine dependence: Secondary | ICD-10-CM

## 2011-06-02 DIAGNOSIS — Z85038 Personal history of other malignant neoplasm of large intestine: Secondary | ICD-10-CM | POA: Insufficient documentation

## 2011-06-02 DIAGNOSIS — E1065 Type 1 diabetes mellitus with hyperglycemia: Secondary | ICD-10-CM | POA: Insufficient documentation

## 2011-06-02 DIAGNOSIS — C183 Malignant neoplasm of hepatic flexure: Principal | ICD-10-CM | POA: Diagnosis present

## 2011-06-02 DIAGNOSIS — Z794 Long term (current) use of insulin: Secondary | ICD-10-CM

## 2011-06-02 DIAGNOSIS — C182 Malignant neoplasm of ascending colon: Secondary | ICD-10-CM

## 2011-06-02 DIAGNOSIS — E119 Type 2 diabetes mellitus without complications: Secondary | ICD-10-CM | POA: Diagnosis present

## 2011-06-02 LAB — CBC
Platelets: 220 10*3/uL (ref 150–400)
RBC: 3.92 MIL/uL — ABNORMAL LOW (ref 4.22–5.81)
RDW: 16.1 % — ABNORMAL HIGH (ref 11.5–15.5)
WBC: 9.6 10*3/uL (ref 4.0–10.5)

## 2011-06-02 LAB — GLUCOSE, CAPILLARY
Glucose-Capillary: 131 mg/dL — ABNORMAL HIGH (ref 70–99)
Glucose-Capillary: 123 mg/dL — ABNORMAL HIGH (ref 70–99)
Glucose-Capillary: 171 mg/dL — ABNORMAL HIGH (ref 70–99)
Glucose-Capillary: 225 mg/dL — ABNORMAL HIGH (ref 70–99)

## 2011-06-02 LAB — CREATININE, SERUM
Creatinine, Ser: 1 mg/dL (ref 0.50–1.35)
GFR calc Af Amer: >90 mL/min (ref >90)

## 2011-06-02 SURGERY — LAPAROSCOPIC PARTIAL COLECTOMY
Anesthesia: General | Site: Abdomen | Wound class: Contaminated

## 2011-06-02 MED ORDER — GUAIFENESIN-DM 100-10 MG/5ML PO SYRP: 15.0000 mL | ORAL_SOLUTION | ORAL | Status: DC | PRN

## 2011-06-02 MED ORDER — LACTATED RINGERS IV SOLN
INTRAVENOUS | Status: DC
  Administered 2011-06-02: 1000 mL via INTRAVENOUS

## 2011-06-02 MED ORDER — ALUM & MAG HYDROXIDE-SIMETH 200-200-20 MG/5ML PO SUSP: 30.0000 mL | Freq: Four times a day (QID) | ORAL | Status: DC | PRN

## 2011-06-02 MED ORDER — DIPHENHYDRAMINE HCL 50 MG/ML IJ SOLN: 12.5000 mg | Freq: Four times a day (QID) | INTRAMUSCULAR | Status: DC | PRN

## 2011-06-02 MED ORDER — VITAMIN C 500 MG PO TABS
500.0000 mg | ORAL_TABLET | Freq: Every day | ORAL | Status: DC
  Administered 2011-06-03 – 2011-06-07 (×5): 500 mg via ORAL
  Filled 2011-06-02 (×5): qty 1

## 2011-06-02 MED ORDER — FLORA-Q PO CAPS
1.0000 | ORAL_CAPSULE | Freq: Every day | ORAL | Status: DC
  Administered 2011-06-03 – 2011-06-06 (×3): 1 via ORAL
  Filled 2011-06-02 (×5): qty 1

## 2011-06-02 MED ORDER — MAGIC MOUTHWASH
15.0000 mL | Freq: Four times a day (QID) | ORAL | Status: DC | PRN
  Filled 2011-06-02: qty 15

## 2011-06-02 MED ORDER — LACTATED RINGERS IR SOLN
Status: DC | PRN
  Administered 2011-06-02: 3000 mL

## 2011-06-02 MED ORDER — SODIUM CHLORIDE 0.9 % IV SOLN
1.0000 g | INTRAVENOUS | Status: AC
  Administered 2011-06-02: 1 g via INTRAVENOUS

## 2011-06-02 MED ORDER — HYDROMORPHONE HCL PF 1 MG/ML IJ SOLN
0.5000 mg | INTRAMUSCULAR | Status: DC | PRN
  Administered 2011-06-03: 1 mg via INTRAVENOUS
  Filled 2011-06-02: qty 1

## 2011-06-02 MED ORDER — PROMETHAZINE HCL 25 MG/ML IJ SOLN: 12.5000 mg | Freq: Four times a day (QID) | INTRAMUSCULAR | Status: DC | PRN

## 2011-06-02 MED ORDER — HYDROMORPHONE HCL PF 1 MG/ML IJ SOLN
INTRAMUSCULAR | Status: DC | PRN
  Administered 2011-06-02 (×2): 1 mg via INTRAVENOUS

## 2011-06-02 MED ORDER — LEVOCETIRIZINE DIHYDROCHLORIDE 5 MG PO TABS: 5.0000 mg | ORAL_TABLET | Freq: Every day | ORAL | Status: DC

## 2011-06-02 MED ORDER — FLUTICASONE PROPIONATE 50 MCG/ACT NA SUSP
1.0000 | Freq: Every day | NASAL | Status: DC
  Administered 2011-06-03 – 2011-06-07 (×5): 1 via NASAL
  Filled 2011-06-02 (×2): qty 16

## 2011-06-02 MED ORDER — LIDOCAINE HCL (CARDIAC) 20 MG/ML IV SOLN
INTRAVENOUS | Status: DC | PRN
  Administered 2011-06-02: 100 mg via INTRAVENOUS

## 2011-06-02 MED ORDER — LACTATED RINGERS IV SOLN
INTRAVENOUS | Status: DC | PRN
  Administered 2011-06-02 (×3): via INTRAVENOUS

## 2011-06-02 MED ORDER — BUPIVACAINE LIPOSOME 1.3 % IJ SUSP
20.0000 mL | Freq: Once | INTRAMUSCULAR | Status: DC
  Filled 2011-06-02: qty 20

## 2011-06-02 MED ORDER — DROPERIDOL 2.5 MG/ML IJ SOLN
INTRAMUSCULAR | Status: DC | PRN
  Administered 2011-06-02: .5 mg via INTRAVENOUS

## 2011-06-02 MED ORDER — HYDROMORPHONE HCL PF 1 MG/ML IJ SOLN
INTRAMUSCULAR | Status: AC
  Filled 2011-06-02: qty 1

## 2011-06-02 MED ORDER — HEPARIN SODIUM (PORCINE) 5000 UNIT/ML IJ SOLN
INTRAMUSCULAR | Status: AC
  Administered 2011-06-02: 5000 [IU] via SUBCUTANEOUS
  Filled 2011-06-02: qty 1

## 2011-06-02 MED ORDER — HYDROMORPHONE BOLUS VIA INFUSION: 0.5000 mg | INTRAVENOUS | Status: DC | PRN

## 2011-06-02 MED ORDER — LATANOPROST 0.005 % OP SOLN
1.0000 [drp] | Freq: Every day | OPHTHALMIC | Status: DC
  Administered 2011-06-02 – 2011-06-06 (×5): 1 [drp] via OPHTHALMIC
  Filled 2011-06-02: qty 2.5

## 2011-06-02 MED ORDER — BUPIVACAINE-EPINEPHRINE 0.25% -1:200000 IJ SOLN
INTRAMUSCULAR | Status: AC
  Filled 2011-06-02: qty 1

## 2011-06-02 MED ORDER — TIMOLOL MALEATE 0.5 % OP SOLN
1.0000 [drp] | Freq: Two times a day (BID) | OPHTHALMIC | Status: DC
  Administered 2011-06-02 – 2011-06-07 (×10): 1 [drp] via OPHTHALMIC
  Filled 2011-06-02: qty 5

## 2011-06-02 MED ORDER — ACETAMINOPHEN 10 MG/ML IV SOLN
INTRAVENOUS | Status: DC | PRN
  Administered 2011-06-02: 1000 mg via INTRAVENOUS

## 2011-06-02 MED ORDER — LISINOPRIL 2.5 MG PO TABS
2.5000 mg | ORAL_TABLET | Freq: Every day | ORAL | Status: DC
  Administered 2011-06-03 – 2011-06-07 (×5): 2.5 mg via ORAL
  Filled 2011-06-02 (×5): qty 1

## 2011-06-02 MED ORDER — ROCURONIUM BROMIDE 100 MG/10ML IV SOLN
INTRAVENOUS | Status: DC | PRN
  Administered 2011-06-02 (×4): 5 mg via INTRAVENOUS
  Administered 2011-06-02: 50 mg via INTRAVENOUS
  Administered 2011-06-02: 10 mg via INTRAVENOUS

## 2011-06-02 MED ORDER — ONDANSETRON HCL 4 MG PO TABS: 4.0000 mg | ORAL_TABLET | Freq: Four times a day (QID) | ORAL | Status: DC | PRN

## 2011-06-02 MED ORDER — BUPIVACAINE 0.25 % ON-Q PUMP DUAL CATH 300 ML
300.0000 mL | INJECTION | Status: DC
  Administered 2011-06-02: 300 mL
  Filled 2011-06-02: qty 300

## 2011-06-02 MED ORDER — ASPIRIN EC 81 MG PO TBEC
81.0000 mg | DELAYED_RELEASE_TABLET | Freq: Every day | ORAL | Status: DC
  Administered 2011-06-03 – 2011-06-07 (×5): 81 mg via ORAL
  Filled 2011-06-02 (×5): qty 1

## 2011-06-02 MED ORDER — LACTATED RINGERS IV SOLN
INTRAVENOUS | Status: DC
  Administered 2011-06-02 – 2011-06-03 (×2): via INTRAVENOUS
  Administered 2011-06-04: 10 mL/h via INTRAVENOUS

## 2011-06-02 MED ORDER — ONDANSETRON HCL 4 MG/2ML IJ SOLN: 4.0000 mg | Freq: Four times a day (QID) | INTRAMUSCULAR | Status: DC | PRN

## 2011-06-02 MED ORDER — BUPIVACAINE-EPINEPHRINE PF 0.25-1:200000 % IJ SOLN
INTRAMUSCULAR | Status: DC | PRN
  Administered 2011-06-02: 50 mL

## 2011-06-02 MED ORDER — METOPROLOL TARTRATE 12.5 MG HALF TABLET
12.5000 mg | ORAL_TABLET | Freq: Two times a day (BID) | ORAL | Status: DC | PRN
  Filled 2011-06-02: qty 1

## 2011-06-02 MED ORDER — 0.9 % SODIUM CHLORIDE (POUR BTL) OPTIME
TOPICAL | Status: DC | PRN
  Administered 2011-06-02: 3000 mL

## 2011-06-02 MED ORDER — HEPARIN SODIUM (PORCINE) 5000 UNIT/ML IJ SOLN
5000.0000 [IU] | Freq: Once | INTRAMUSCULAR | Status: AC
  Administered 2011-06-02: 5000 [IU] via SUBCUTANEOUS

## 2011-06-02 MED ORDER — BRIMONIDINE TARTRATE-TIMOLOL 0.2-0.5 % OP SOLN: 1.0000 [drp] | Freq: Two times a day (BID) | OPHTHALMIC | Status: DC

## 2011-06-02 MED ORDER — PSYLLIUM 95 % PO PACK
1.0000 | PACK | Freq: Two times a day (BID) | ORAL | Status: DC
  Administered 2011-06-03 – 2011-06-07 (×9): 1 via ORAL
  Filled 2011-06-02 (×11): qty 1

## 2011-06-02 MED ORDER — ACETAMINOPHEN 10 MG/ML IV SOLN
INTRAVENOUS | Status: AC
  Filled 2011-06-02: qty 100

## 2011-06-02 MED ORDER — GLYCOPYRROLATE 0.2 MG/ML IJ SOLN
INTRAMUSCULAR | Status: DC | PRN
  Administered 2011-06-02: .2 mg via INTRAVENOUS
  Administered 2011-06-02: .4 mg via INTRAVENOUS

## 2011-06-02 MED ORDER — ALVIMOPAN 12 MG PO CAPS
ORAL_CAPSULE | ORAL | Status: AC
  Administered 2011-06-02: 12 mg via ORAL
  Filled 2011-06-02: qty 1

## 2011-06-02 MED ORDER — ALVIMOPAN 12 MG PO CAPS
12.0000 mg | ORAL_CAPSULE | Freq: Once | ORAL | Status: AC
  Administered 2011-06-02: 12 mg via ORAL

## 2011-06-02 MED ORDER — KETAMINE HCL 10 MG/ML IJ SOLN
INTRAMUSCULAR | Status: DC | PRN
  Administered 2011-06-02 (×10): 5 mg via INTRAVENOUS

## 2011-06-02 MED ORDER — ALBUTEROL SULFATE (5 MG/ML) 0.5% IN NEBU: 2.5000 mg | INHALATION_SOLUTION | Freq: Four times a day (QID) | RESPIRATORY_TRACT | Status: DC | PRN

## 2011-06-02 MED ORDER — INSULIN ASPART 100 UNIT/ML ~~LOC~~ SOLN
0.0000 [IU] | Freq: Three times a day (TID) | SUBCUTANEOUS | Status: DC
  Administered 2011-06-03: 5 [IU] via SUBCUTANEOUS
  Filled 2011-06-02: qty 3

## 2011-06-02 MED ORDER — KETOROLAC TROMETHAMINE 60 MG/2ML IM SOLN
INTRAMUSCULAR | Status: DC | PRN
  Administered 2011-06-02: 30 mg via INTRAMUSCULAR

## 2011-06-02 MED ORDER — INSULIN GLARGINE 100 UNIT/ML ~~LOC~~ SOLN
22.0000 [IU] | Freq: Every day | SUBCUTANEOUS | Status: DC
  Administered 2011-06-02 – 2011-06-06 (×5): 22 [IU] via SUBCUTANEOUS
  Filled 2011-06-02: qty 3

## 2011-06-02 MED ORDER — KETOROLAC TROMETHAMINE 15 MG/ML IJ SOLN: 15.0000 mg | Freq: Four times a day (QID) | INTRAMUSCULAR | Status: DC | PRN

## 2011-06-02 MED ORDER — INSULIN ASPART 100 UNIT/ML ~~LOC~~ SOLN: 1.0000 [IU] | Freq: Three times a day (TID) | SUBCUTANEOUS | Status: DC

## 2011-06-02 MED ORDER — SODIUM CHLORIDE 0.9 % IV SOLN
INTRAVENOUS | Status: AC
  Filled 2011-06-02: qty 1

## 2011-06-02 MED ORDER — ALVIMOPAN 12 MG PO CAPS
12.0000 mg | ORAL_CAPSULE | Freq: Two times a day (BID) | ORAL | Status: DC
  Administered 2011-06-03 – 2011-06-07 (×9): 12 mg via ORAL
  Filled 2011-06-02 (×11): qty 1

## 2011-06-02 MED ORDER — PROMETHAZINE HCL 25 MG/ML IJ SOLN: 6.2500 mg | INTRAMUSCULAR | Status: DC | PRN

## 2011-06-02 MED ORDER — PROPOFOL 10 MG/ML IV EMUL
INTRAVENOUS | Status: DC | PRN
  Administered 2011-06-02: 150 mg via INTRAVENOUS

## 2011-06-02 MED ORDER — NEOSTIGMINE METHYLSULFATE 1 MG/ML IJ SOLN
INTRAMUSCULAR | Status: DC | PRN
  Administered 2011-06-02: 3 mg via INTRAVENOUS

## 2011-06-02 MED ORDER — HEPARIN SODIUM (PORCINE) 5000 UNIT/ML IJ SOLN
5000.0000 [IU] | Freq: Three times a day (TID) | INTRAMUSCULAR | Status: DC
  Administered 2011-06-03 – 2011-06-07 (×13): 5000 [IU] via SUBCUTANEOUS
  Filled 2011-06-02 (×16): qty 1

## 2011-06-02 MED ORDER — ONDANSETRON HCL 4 MG/2ML IJ SOLN
INTRAMUSCULAR | Status: DC | PRN
  Administered 2011-06-02 (×4): 1 mg via INTRAVENOUS

## 2011-06-02 MED ORDER — LORATADINE 10 MG PO TABS
10.0000 mg | ORAL_TABLET | Freq: Every day | ORAL | Status: DC
  Administered 2011-06-03 – 2011-06-07 (×5): 10 mg via ORAL
  Filled 2011-06-02 (×5): qty 1

## 2011-06-02 MED ORDER — FENTANYL CITRATE 0.05 MG/ML IJ SOLN
INTRAMUSCULAR | Status: DC | PRN
  Administered 2011-06-02 (×2): 50 ug via INTRAVENOUS
  Administered 2011-06-02: 100 ug via INTRAVENOUS
  Administered 2011-06-02 (×3): 50 ug via INTRAVENOUS

## 2011-06-02 MED ORDER — HYDROMORPHONE HCL PF 1 MG/ML IJ SOLN
0.2500 mg | INTRAMUSCULAR | Status: DC | PRN
  Administered 2011-06-02: 0.5 mg via INTRAVENOUS

## 2011-06-02 MED ORDER — MIDAZOLAM HCL 5 MG/5ML IJ SOLN
INTRAMUSCULAR | Status: DC | PRN
  Administered 2011-06-02: 2 mg via INTRAVENOUS

## 2011-06-02 MED ORDER — ADULT MULTIVITAMIN W/MINERALS CH
1.0000 | ORAL_TABLET | Freq: Every day | ORAL | Status: DC
  Administered 2011-06-03 – 2011-06-07 (×5): 1 via ORAL
  Filled 2011-06-02 (×5): qty 1

## 2011-06-02 MED ORDER — BRIMONIDINE TARTRATE 0.2 % OP SOLN
1.0000 [drp] | Freq: Two times a day (BID) | OPHTHALMIC | Status: DC
Start: 2011-06-02 — End: 2011-06-07
  Administered 2011-06-02 – 2011-06-07 (×10): 1 [drp] via OPHTHALMIC
  Filled 2011-06-02: qty 5

## 2011-06-02 MED ORDER — OXYCODONE HCL 5 MG PO TABS: 5.0000 mg | ORAL_TABLET | ORAL | Status: AC | PRN

## 2011-06-02 SURGICAL SUPPLY — 92 items
APPLIER CLIP 5 13 M/L LIGAMAX5 (MISCELLANEOUS)
APPLIER CLIP ROT 10 11.4 M/L (STAPLE)
BLADE EXTENDED COATED 6.5IN (ELECTRODE) ×2 IMPLANT
BLADE HEX COATED 2.75 (ELECTRODE) ×2 IMPLANT
BLADE SURG SZ10 CARB STEEL (BLADE) ×2 IMPLANT
CABLE HIGH FREQUENCY MONO STRZ (ELECTRODE) ×2 IMPLANT
CANISTER SUCTION 2500CC (MISCELLANEOUS) IMPLANT
CATH KIT ON Q 5IN DUAL SLV (PAIN MANAGEMENT) ×8 IMPLANT
CATH KIT ON-Q SILVERSOAK 7.5IN (CATHETERS) ×4 IMPLANT
CELLS DAT CNTRL 66122 CELL SVR (MISCELLANEOUS) IMPLANT
CLIP APPLIE 5 13 M/L LIGAMAX5 (MISCELLANEOUS) IMPLANT
CLIP APPLIE ROT 10 11.4 M/L (STAPLE) IMPLANT
CLOTH BEACON ORANGE TIMEOUT ST (SAFETY) ×2 IMPLANT
COVER MAYO STAND STRL (DRAPES) ×2 IMPLANT
DECANTER SPIKE VIAL GLASS SM (MISCELLANEOUS) ×2 IMPLANT
DRAIN CHANNEL 19F RND (DRAIN) IMPLANT
DRAPE LAPAROSCOPIC ABDOMINAL (DRAPES) ×2 IMPLANT
DRAPE LG THREE QUARTER DISP (DRAPES) ×2 IMPLANT
DRAPE UTILITY 15X26 (DRAPE) ×4 IMPLANT
DRAPE WARM FLUID 44X44 (DRAPE) ×4 IMPLANT
DRSG TEGADERM 2-3/8X2-3/4 SM (GAUZE/BANDAGES/DRESSINGS) ×6 IMPLANT
DRSG TEGADERM 4X4.75 (GAUZE/BANDAGES/DRESSINGS) ×4 IMPLANT
ELECT REM PT RETURN 9FT ADLT (ELECTROSURGICAL) ×2
ELECTRODE REM PT RTRN 9FT ADLT (ELECTROSURGICAL) ×1 IMPLANT
FILTER SMOKE EVAC LAPAROSHD (FILTER) IMPLANT
GAUZE SPONGE 2X2 8PLY STRL LF (GAUZE/BANDAGES/DRESSINGS) ×2 IMPLANT
GLOVE BIOGEL M STRL SZ7.5 (GLOVE) ×6 IMPLANT
GLOVE BIOGEL PI IND STRL 7.0 (GLOVE) ×1 IMPLANT
GLOVE BIOGEL PI INDICATOR 7.0 (GLOVE) ×1
GLOVE ECLIPSE 8.0 STRL XLNG CF (GLOVE) ×4 IMPLANT
GLOVE INDICATOR 8.0 STRL GRN (GLOVE) ×6 IMPLANT
GLOVE SURG SS PI 6.5 STRL IVOR (GLOVE) ×6 IMPLANT
GLOVE SURG SS PI 7.5 STRL IVOR (GLOVE) ×4 IMPLANT
GOWN STRL NON-REIN LRG LVL3 (GOWN DISPOSABLE) ×6 IMPLANT
GOWN STRL REIN XL XLG (GOWN DISPOSABLE) ×6 IMPLANT
HAND ACTIVATED (MISCELLANEOUS) IMPLANT
KIT BASIN OR (CUSTOM PROCEDURE TRAY) ×2 IMPLANT
LEGGING LITHOTOMY PAIR STRL (DRAPES) ×2 IMPLANT
LIGASURE IMPACT 36 18CM CVD LR (INSTRUMENTS) IMPLANT
MANIFOLD NEPTUNE II (INSTRUMENTS) ×2 IMPLANT
NS IRRIG 1000ML POUR BTL (IV SOLUTION) ×6 IMPLANT
PENCIL BUTTON HOLSTER BLD 10FT (ELECTRODE) ×2 IMPLANT
RTRCTR WOUND ALEXIS 18CM MED (MISCELLANEOUS)
SCISSORS LAP 5X35 DISP (ENDOMECHANICALS) ×2 IMPLANT
SEALER TISSUE G2 CVD JAW 35 (ENDOMECHANICALS) ×1 IMPLANT
SEALER TISSUE G2 CVD JAW 45CM (ENDOMECHANICALS) ×1
SET IRRIG TUBING LAPAROSCOPIC (IRRIGATION / IRRIGATOR) ×2 IMPLANT
SLEEVE Z-THREAD 5X100MM (TROCAR) ×6 IMPLANT
SPONGE GAUZE 2X2 STER 10/PKG (GAUZE/BANDAGES/DRESSINGS) ×2
SPONGE GAUZE 4X4 12PLY (GAUZE/BANDAGES/DRESSINGS) ×2 IMPLANT
SPONGE LAP 18X18 X RAY DECT (DISPOSABLE) ×6 IMPLANT
STAPLER 90 3.5 STAND SLIM (STAPLE) ×2
STAPLER 90 3.5 STD SLIM (STAPLE) ×1 IMPLANT
STAPLER PROXIMATE 75MM BLUE (STAPLE) ×2 IMPLANT
STAPLER VISISTAT 35W (STAPLE) IMPLANT
SUCTION POOLE TIP (SUCTIONS) ×2 IMPLANT
SUT ETHILON 2 0 PS N (SUTURE) IMPLANT
SUT MNCRL 0 MO-4 VIOLET 18 CR (SUTURE) ×2 IMPLANT
SUT MONOCRYL 0 MO 4 18  CR/8 (SUTURE) ×2
SUT PDS AB 1 CTX 36 (SUTURE) ×4 IMPLANT
SUT PDS AB 1 TP1 96 (SUTURE) IMPLANT
SUT PDS AB 2-0 CT2 27 (SUTURE) IMPLANT
SUT PROLENE 0 CT 2 (SUTURE) IMPLANT
SUT PROLENE 2 0 CT2 30 (SUTURE) IMPLANT
SUT PROLENE 2 0 KS (SUTURE) IMPLANT
SUT SILK 2 0 (SUTURE) ×1
SUT SILK 2 0 SH CR/8 (SUTURE) ×6 IMPLANT
SUT SILK 2-0 18XBRD TIE 12 (SUTURE) ×1 IMPLANT
SUT SILK 3 0 (SUTURE) ×1
SUT SILK 3 0 SH CR/8 (SUTURE) IMPLANT
SUT SILK 3-0 18XBRD TIE 12 (SUTURE) ×1 IMPLANT
SUT VIC AB 2-0 SH 18 (SUTURE) IMPLANT
SUT VIC AB 3-0 SH 18 (SUTURE) ×2 IMPLANT
SUT VICRYL 2 0 18  UND BR (SUTURE)
SUT VICRYL 2 0 18 UND BR (SUTURE) IMPLANT
SYS LAPSCP GELPORT 120MM (MISCELLANEOUS) ×2
SYSTEM LAPSCP GELPORT 120MM (MISCELLANEOUS) ×1 IMPLANT
TAPE UMBILICAL COTTON 1/8X30 (MISCELLANEOUS) ×2 IMPLANT
TOWEL OR 17X26 10 PK STRL BLUE (TOWEL DISPOSABLE) ×8 IMPLANT
TOWEL OR NON WOVEN STRL DISP B (DISPOSABLE) ×4 IMPLANT
TRAY FOLEY CATH 14FRSI W/METER (CATHETERS) ×2 IMPLANT
TRAY LAP CHOLE (CUSTOM PROCEDURE TRAY) ×2 IMPLANT
TROCAR XCEL NON-BLD 5MMX100MML (ENDOMECHANICALS) ×2 IMPLANT
TROCAR Z-THREAD FIOS 11X100 BL (TROCAR) IMPLANT
TROCAR Z-THREAD FIOS 5X100MM (TROCAR) ×4 IMPLANT
TROCAR Z-THREAD SLEEVE 11X100 (TROCAR) IMPLANT
TUBING CONNECTING 10 (TUBING) ×4 IMPLANT
TUBING FILTER THERMOFLATOR (ELECTROSURGICAL) ×2 IMPLANT
TUNNELER SHEATH ON-Q 16GX12 DP (PAIN MANAGEMENT) ×2 IMPLANT
WATER STERILE IRR 1500ML POUR (IV SOLUTION) ×2 IMPLANT
YANKAUER SUCT BULB TIP 10FT TU (MISCELLANEOUS) ×2 IMPLANT
YANKAUER SUCT BULB TIP NO VENT (SUCTIONS) ×2 IMPLANT

## 2011-06-02 NOTE — Transfer of Care (Signed)
Immediate Anesthesia Transfer of Care Note  Patient: Greg Robertson  Procedure(s) Performed: Procedure(s) (LRB): LAPAROSCOPIC PARTIAL COLECTOMY (N/A)  Patient Location: PACU  Anesthesia Type: General  Level of Consciousness: awake and alert   Airway & Oxygen Therapy: Patient Spontanous Breathing and Patient connected to face mask  Post-op Assessment: Report given to PACU RN and Post -op Vital signs reviewed and stable  Post vital signs: Reviewed and stable  Complications: No apparent anesthesia complications

## 2011-06-02 NOTE — Interval H&P Note (Signed)
History and Physical Interval Note:  06/02/2011 2:06 PM  Greg Robertson  has presented today for surgery, with the diagnosis of cancer of ascending colon   The various methods of treatment have been discussed with the patient and family. After consideration of risks, benefits and other options for treatment, the patient has consented to  Procedure(s) (LRB): LAPAROSCOPIC PARTIAL COLECTOMY (N/A) as a surgical intervention .  The patients' history has been reviewed, patient examined, no change in status, stable for surgery.  I have reviewed the patients' chart and labs.  Questions were answered to the patient's satisfaction.     [name]

## 2011-06-02 NOTE — Discharge Instructions (Signed)

## 2011-06-02 NOTE — Anesthesia Postprocedure Evaluation (Signed)
  Anesthesia Post-op Note  Patient: Greg Robertson  Procedure(s) Performed: Procedure(s) (LRB): LAPAROSCOPIC PARTIAL COLECTOMY (N/A)  Patient Location: PACU  Anesthesia Type: General  Level of Consciousness: awake and alert   Airway and Oxygen Therapy: Patient Spontanous Breathing  Post-op Pain: mild  Post-op Assessment: Post-op Vital signs reviewed, Patient's Cardiovascular Status Stable, Respiratory Function Stable, Patent Airway and No signs of Nausea or vomiting  Post-op Vital Signs: stable  Complications: No apparent anesthesia complications

## 2011-06-02 NOTE — H&P (View-Only) (Signed)
Subjective:     Patient ID: EGE MUCKEY, male   DOB: [birth year], 61 y.o.   MRN: [id number]  HPI  DEONE LEIFHEIT  December 16, 1950 [id number]  Patient Care Team: Rudi Heap, MD as PCP - General (Family Medicine) Louis Meckel, MD as Consulting Physician (Gastroenterology)  This patient is a 61 y.o.male who presents today for surgical evaluation at the request of Dr. Arlyce Dice.   Reason for visit: Newly diagnosed cancer and proximal colon.  The patient is a pleasant active male. He began to have some abdominal discomfort. He started Prilosec and TUMS. His appetite decreased. He was found to be anemic. He was sent for colonoscopy and endoscopy to rule out a GI tract etiology. He was found to have a large mass in his proximal colon. Biopsy showed cancer. Therefore, Dr. Arlyce Dice sent the patient to me for evaluation.  The patient is rather active. No prior problems surgeries. He usually has a bowel movement everyday medications had some loose stools. He is insulin requiring diabetic. No major changes on his diabetic regimen. Did have a period of decreased appetite and weight loss. However he feels like he is rallying from that.  Patient Active Problem List  Diagnoses  . Nonspecific abnormal finding in stool contents  . Diabetes mellitus without mention of complication  . Glaucoma  . Cancer of ascending colon, 7cm    Past Medical History  Diagnosis Date  . Diabetes mellitus   . Anemia   . Glaucoma   . Pneumonia   . Allergic rhinitis   . Prostate nodule   . Colonic mass   . Cancer   . Substance abuse   . Cancer of ascending colon, 7cm 05/25/2011    Past Surgical History  Procedure Date  . Tonsillectomy 1957 - approximate  . Finger surgery 2009    right middle  . Nasal fracture surgery 1968    History   Social History  . Marital Status: Single    Spouse Name: N/A    Number of Children: 0  . Years of Education: N/A   Occupational History  . sales    Social History  Main Topics  . Smoking status: Former Smoker    Quit date: 04/18/1993  . Smokeless tobacco: Never Used  . Alcohol Use: Yes     1 drink every 2 days  . Drug Use: Yes     once a night marijuana  . Sexually Active: Not on file   Other Topics Concern  . Not on file   Social History Narrative  . No narrative on file    Family History  Problem Relation Age of Onset  . Colon polyps Father   . Lung cancer Father   . Diabetes Father   . Breast cancer Mother   . Pancreatitis Mother     intestinal adhesions    Current outpatient prescriptions:ACCU-CHEK AVIVA PLUS test strip, Daily., Disp: , Rfl: ;  Ascorbic Acid (VITAMIN C PO), Take 1 tablet by mouth daily., Disp: , Rfl: ;  Aspirin (ECOTRIN PO), Take 1 tablet by mouth daily., Disp: , Rfl: ;  brimonidine-timolol (COMBIGAN) 0.2-0.5 % ophthalmic solution, Place 1 drop into both eyes daily., Disp: , Rfl:  insulin aspart (NOVOLOG) 100 UNIT/ML injection, Inject into the skin. Take 1 unit for ever 6 grams of carbs before each meal, Disp: , Rfl: ;  insulin glargine (LANTUS) 100 UNIT/ML injection, Inject 22 Units into the skin at bedtime., Disp: , Rfl: ;  latanoprost (XALATAN) 0.005 %  ophthalmic solution, Place 1 drop into both eyes at bedtime., Disp: , Rfl: ;  levocetirizine (XYZAL) 5 MG tablet, Take 5 mg by mouth every evening., Disp: , Rfl:  LISINOPRIL PO, Take 1 tablet by mouth daily., Disp: , Rfl: ;  Misc. Devices (NASADOCK) MISC, Place 1 spray into both nostrils daily., Disp: , Rfl: ;  Multiple Vitamin (MULTIVITAMIN) tablet, Take 1 tablet by mouth daily., Disp: , Rfl: ;  SIMVASTATIN PO, Take 1 tablet by mouth daily., Disp: , Rfl: ;  VITAMIN D, CHOLECALCIFEROL, PO, Take 1 tablet by mouth daily., Disp: , Rfl:  Current facility-administered medications:0.9 %  sodium chloride infusion, 500 mL, Intravenous, Continuous, Louis Meckel, MD  No Known Allergies  BP 146/80  Pulse 70  Temp(Src) 98.1 F (36.7 C) (Temporal)  Resp 18  Ht 6\' 3"  (1.905 m)   Wt 168 lb 9.6 oz (76.476 kg)  BMI 21.07 kg/m2     Review of Systems  Constitutional: Positive for appetite change and unexpected weight change. Negative for fever, chills and diaphoresis.       5-10lbs weight loss.  Regaining it back  HENT: Negative for nosebleeds, sore throat, facial swelling, mouth sores, trouble swallowing and ear discharge.   Eyes: Negative for photophobia, discharge and visual disturbance.  Respiratory: Negative for choking, chest tightness, shortness of breath and stridor.   Cardiovascular: Negative for chest pain and palpitations.       Patient walks 60 minutes for about 2-3 miles without difficulty.  No exertional chest/neck/shoulder/arm pain.   Gastrointestinal: Negative for nausea, vomiting, abdominal pain, diarrhea, constipation, blood in stool, abdominal distention, anal bleeding and rectal pain.  Genitourinary: Negative for dysuria, urgency, difficulty urinating and testicular pain.  Musculoskeletal: Negative for myalgias, back pain, arthralgias and gait problem.  Skin: Negative for color change, pallor, rash and wound.  Neurological: Negative for dizziness, speech difficulty, weakness, numbness and headaches.  Hematological: Negative for adenopathy. Does not bruise/bleed easily.  Psychiatric/Behavioral: Negative for hallucinations, confusion and agitation.       Objective:   Physical Exam  Constitutional: He is oriented to person, place, and time. He appears well-developed and well-nourished. No distress.  HENT:  Head: Normocephalic.  Mouth/Throat: Oropharynx is clear and moist. No oropharyngeal exudate.  Eyes: Conjunctivae and EOM are normal. Pupils are equal, round, and reactive to light. No scleral icterus.  Neck: Normal range of motion. Neck supple. No tracheal deviation present.  Cardiovascular: Normal rate, regular rhythm and intact distal pulses.   Pulmonary/Chest: Effort normal and breath sounds normal. No respiratory distress.  Abdominal:  Soft. He exhibits mass. He exhibits no distension. There is no tenderness. There is no rebound and no guarding. Hernia confirmed negative in the right inguinal area and confirmed negative in the left inguinal area.       Fullness right abd wall, probable mass  Genitourinary: Penis normal. No penile tenderness.  Musculoskeletal: Normal range of motion. He exhibits no tenderness.  Lymphadenopathy:    He has no cervical adenopathy.       Right: No inguinal adenopathy present.       Left: No inguinal adenopathy present.  Neurological: He is alert and oriented to person, place, and time. No cranial nerve deficit. He exhibits normal muscle tone. Coordination normal.  Skin: Skin is warm and dry. No rash noted. He is not diaphoretic. No erythema. No pallor.  Psychiatric: He has a normal mood and affect. His behavior is normal. Judgment and thought content normal.  Assessment:     Large cancer of ascending colon     Plan:     He require surgery to remove that segment of his colon. He is thin without operations was a good laparoscopic candidate. However the tumor is large, so we'll see how large the extraction incision has to be. It is reasonable to start laparoscopically. He wishes to proceed. We'll work to coordinate this soon to avoid any delay.  The anatomy & physiology of the digestive tract was discussed.  The pathophysiology was discussed.  Natural history risks without surgery was discussed.   I feel the risks of no intervention will lead to serious problems that outweigh the operative risks; therefore, I recommended a partial colectomy to remove the pathology.  Laparoscopic & open techniques were discussed.   Risks such as bleeding, infection, abscess, leak, reoperation, possible ostomy, hernia, heart attack, death, and other risks were discussed.  I noted a good likelihood this will help address the problem.   Goals of post-operative recovery were discussed as well.  We will work to  minimize complications.  An educational handout on the pathology was given as well.  Questions were answered.  The patient expresses understanding & wishes to proceed with surgery.

## 2011-06-02 NOTE — Brief Op Note (Signed)
06/02/2011  6:25 PM  PATIENT:  Greg Robertson  61 y.o. male  Patient Care Team: Rudi Heap, MD as PCP - General (Family Medicine) Louis Meckel, MD as Consulting Physician (Gastroenterology)  PRE-OPERATIVE DIAGNOSIS:  cancer of ascending colon   POST-OPERATIVE DIAGNOSIS:  cancer of hepatic flexure of colon  PROCEDURE:  Procedure(s) (LRB): LAPAROSCOPIC PARTIAL COLECTOMY (N/A)  SURGEON:  Surgeon(s) and Role:    * Ardeth Sportsman, MD - Primary  ASSISTANTS: * Atilano Ina, MD - Assisting   ANESTHESIA:   local and general  EBL:  Total I/O In: 1800 [I.V.:1800] Out: 190 [Urine:130; Blood:60]  BLOOD ADMINISTERED:none  DRAINS: none   LOCAL MEDICATIONS USED:  BUPIVICAINE   SPECIMEN:  Source of Specimen:  distal ileum to mid transverse colon (stitch at duodenal margin)  DISPOSITION OF SPECIMEN:  PATHOLOGY  COUNTS:  YES  TOURNIQUET:  * No tourniquets in log *  DICTATION: .Other Dictation: Dictation Number [id number]  PLAN OF CARE: Admit to inpatient   PATIENT DISPOSITION:  PACU - hemodynamically stable.   Delay start of Pharmacological VTE agent (>24hrs) due to surgical blood loss or risk of bleeding: no

## 2011-06-02 NOTE — Anesthesia Preprocedure Evaluation (Signed)
Anesthesia Evaluation  Patient identified by MRN, date of birth, ID band Patient awake    Reviewed: Allergy & Precautions, H&P , NPO status , Patient's Chart, lab work & pertinent test results  Airway Mallampati: II TM Distance: >3 FB Neck ROM: Full    Dental No notable dental hx.    Pulmonary pneumonia ,  clear to auscultation  Pulmonary exam normal       Cardiovascular hypertension, Pt. on medications Regular Normal    Neuro/Psych Negative Neurological ROS  Negative Psych ROS   GI/Hepatic negative GI ROS, Neg liver ROS,   Endo/Other  Diabetes mellitus-, Type 1, Insulin Dependent  Renal/GU negative Renal ROS  Genitourinary negative   Musculoskeletal negative musculoskeletal ROS (+)   Abdominal   Peds negative pediatric ROS (+)  Hematology negative hematology ROS (+)   Anesthesia Other Findings   Reproductive/Obstetrics negative OB ROS                           Anesthesia Physical Anesthesia Plan  ASA: III  Anesthesia Plan: General   Post-op Pain Management:    Induction: Intravenous  Airway Management Planned: Oral ETT  Additional Equipment:   Intra-op Plan:   Post-operative Plan: Extubation in OR  Informed Consent: I have reviewed the patients History and Physical, chart, labs and discussed the procedure including the risks, benefits and alternatives for the proposed anesthesia with the patient or authorized representative who has indicated his/her understanding and acceptance.   Dental advisory given  Plan Discussed with: CRNA  Anesthesia Plan Comments:         Anesthesia Quick Evaluation

## 2011-06-03 LAB — CBC
MCHC: 30.9 g/dL (ref 30.0–36.0)
RDW: 15.9 % — ABNORMAL HIGH (ref 11.5–15.5)

## 2011-06-03 LAB — GLUCOSE, CAPILLARY: Glucose-Capillary: 173 mg/dL — ABNORMAL HIGH (ref 70–99)

## 2011-06-03 LAB — BASIC METABOLIC PANEL
BUN: 21 mg/dL (ref 6–23)
Calcium: 8.7 mg/dL (ref 8.4–10.5)
Creatinine, Ser: 1.1 mg/dL (ref 0.50–1.35)
GFR calc Af Amer: 82 mL/min — ABNORMAL LOW (ref >90)
GFR calc non Af Amer: 71 mL/min — ABNORMAL LOW (ref >90)

## 2011-06-03 MED ORDER — INSULIN ASPART 100 UNIT/ML ~~LOC~~ SOLN
0.0000 [IU] | SUBCUTANEOUS | Status: DC
Start: 2011-06-03 — End: 2011-06-04
  Administered 2011-06-03: 7 [IU] via SUBCUTANEOUS
  Administered 2011-06-03 (×2): 4 [IU] via SUBCUTANEOUS
  Administered 2011-06-04: 15 [IU] via SUBCUTANEOUS
  Administered 2011-06-04: 3 [IU] via SUBCUTANEOUS
  Filled 2011-06-03: qty 3

## 2011-06-03 MED ORDER — HYDROMORPHONE HCL PF 1 MG/ML IJ SOLN
0.5000 mg | INTRAMUSCULAR | Status: DC | PRN
  Administered 2011-06-03: 1 mg via INTRAVENOUS
  Administered 2011-06-03 – 2011-06-04 (×2): 2 mg via INTRAVENOUS
  Administered 2011-06-04: 1 mg via INTRAVENOUS
  Administered 2011-06-04: 2 mg via INTRAVENOUS
  Administered 2011-06-04 – 2011-06-05 (×2): 1 mg via INTRAVENOUS
  Administered 2011-06-05: 2 mg via INTRAVENOUS
  Administered 2011-06-06: 1 mg via INTRAVENOUS
  Filled 2011-06-03: qty 1
  Filled 2011-06-03 (×2): qty 2
  Filled 2011-06-03 (×2): qty 1
  Filled 2011-06-03: qty 2
  Filled 2011-06-03: qty 1
  Filled 2011-06-03 (×2): qty 2

## 2011-06-03 MED ORDER — OXYCODONE HCL 5 MG PO TABS
5.0000 mg | ORAL_TABLET | ORAL | Status: DC | PRN
  Administered 2011-06-03: 5 mg via ORAL
  Administered 2011-06-05 – 2011-06-07 (×6): 10 mg via ORAL
  Filled 2011-06-03 (×5): qty 2
  Filled 2011-06-03: qty 1
  Filled 2011-06-03: qty 2

## 2011-06-03 MED ORDER — ACETAMINOPHEN 500 MG PO TABS: 500.0000 mg | ORAL_TABLET | Freq: Four times a day (QID) | ORAL | Status: DC | PRN

## 2011-06-03 MED ORDER — PNEUMOCOCCAL VAC POLYVALENT 25 MCG/0.5ML IJ INJ
0.5000 mL | INJECTION | INTRAMUSCULAR | Status: AC
  Administered 2011-06-04: 0.5 mL via INTRAMUSCULAR
  Filled 2011-06-03: qty 0.5

## 2011-06-03 MED ORDER — INFLUENZA VIRUS VACC SPLIT PF IM SUSP
0.5000 mL | INTRAMUSCULAR | Status: AC
  Administered 2011-06-04: 0.5 mL via INTRAMUSCULAR
  Filled 2011-06-03: qty 0.5

## 2011-06-03 NOTE — Op Note (Signed)
NAMEMAGNUS, CRESCENZO NO.:  [id number]  MEDICAL RECORD NO.:  [id number]  LOCATION:  1540                         FACILITY:  [facility]  PHYSICIAN:  Ardeth Sportsman, MD     DATE OF BIRTH:  1950-09-27  DATE OF PROCEDURE:  06/02/2011 DATE OF DISCHARGE:                              OPERATIVE REPORT   PRIMARY CARE PHYSICIAN:  Ernestina Penna, M.D.  GASTROENTEROLOGIST:  Barbette Hair. Arlyce Dice, M.D., F.A.C.G.  SURGEON:  Ardeth Sportsman, M.D.  ASSISTANT:  Dr. Andrey Campanile.  PREOPERATIVE DIAGNOSIS:  Large cancer near obstructing @ ascending colon.  POSTOPERATIVE DIAGNOSIS:  Large cancer of hepatic flexure of colon near obstructing.  PROCEDURE PERFORMED:  Laparoscopic-assisted ileocolectomy anastomosis.  ANESTHESIA: 1. General anesthesia. 2. Local anesthetic in a field block on all port sites.  SPECIMEN:  Right colon.  DRAINS:  None.  ESTIMATED BLOOD LOSS:  100 mL.  COMPLICATIONS:  None major.  INDICATIONS:  Mr. Towson is a 61 year old thin male who is found to have a large mass in his proximal colon.  He was sent to me for urgent evaluation.  He was not fully obstructed or anemic yet, but was rather large in size.  CAT scan showed some enlarged lymph nodes, but no definite metastatic disease.  I have made recommendation for a partial colectomy.  Risks, benefits, alternatives were discussed.  Questions answered.  She agreed to proceed.  OPERATIVE FINDINGS:  He had a day for a very large tumor about the size of a small grapefruit at his hepatic flexure.  He had inflammatory adhesions to the duodenum and retroperitoneum.  There is no definite invasion into those organs.  There is no definite evidence of any diffuse metastatic disease.  He did have some bulky lymphadenopathy on the ileocolic and right middle colic vessels.  DESCRIPTION OF PROCEDURE:  Informed consent was confirmed.  The patient received IV antibiotics prior to incision.  He underwent  anesthesia without any difficulty.  He was positioned in a low lithotomy with arms tucked.  His abdomen was clipped, prepped, and draped in sterile fashion.  Surgical time-out confirmed our plan.  I entered the abdomen through the umbilicus through a periumbilical incision by placing a GelPort wound protector.  Camera inspection revealed a large inflammatory mass.  I found the ileocecal vessels and elevated the ileocecal mesentery anteriorly.  I bluntly went through that to get into the retroperitoneal space.  As laterally, I could free off the cecum and descending colon off its retroperitoneal avascular attachements at Toldt's fascia, going laterally by coming from medially.  The planes were a little more inflamed and were obliterated.  Going far medial to the midline, I could see the duodenal sweep.  I was able to elevate off the duodenal mesentery and pancreatic head closer to the midline, but coming into the kind of right paramedian retroperitoneal region, there was some more dense inflammation.  I decided to proceed with lateral to medial mobilization.  I elevated his terminal ileal mesentery and appendix and freed that from its attachments to the retroperitoneum in the pelvic reflection.  With that, I could connect with my prior retroperitoneal dissection and free mobilize up towards the hepatic flexure to  the very large inflamed mass.  I then was able to eviscerate the mid transverse colon and I freed the greater omentum off that and started following in the middle and started heading towards the hepatic flexure.  I got into the lesser sac and could free off the mid transverse colon off its attachments to the retroperitoneum, start mobilizing that on the superior to inferior fashion.  At this point, we alternated insert between hand assist and an open inspection to help free off the adhesions of the hepatic flexure to the retroperitoneum as they were rather thickened.  We  carefully skeletonized the layers and took them with focused cautery as well as bipolar ENSEAL system.  With that, I could finally roll up and see that the mass had inflammatory adhesions to the duodenal serosa around D3.  I could see those directly through the wound protector and sharply freed that off, staying out of the duodenum, but not leave any margins.  After fully mobilizing the colon, inspection of the duodenal sweep saw no evidence of any serosal injury  or other abnormality.    I eviscerated the whole right colon and transverse colon.  I took the ileocecal pedicle with a 2-0 silk clamp ties and ENSEAL.  I then took the right middle colic pedicle as well sparing the left middle colic branch taking that as a high ligation as I came down coming off the aorta as well after carefully skeletonizing it and taking it with clamps and ties.  With that, I had the 2 major pedicles, both proximal and distal to the inflammatory mass of the hepatic flexure.    We did a side-to-side staple anastomosis of the ileum to the mid distal transverse colon.  Anastomosis was viable.  I did close the common defect on the anastomosis using a TA-90.  Hemostasis was excellent.  I did copious irrigation and ensured hemostasis.  We had some bleeding on the omentum, but hemostasis was excellent at the end.  We reinspected the retroperitoneum, especially the duodenal sweep and it looked intact.  I laid an omental patch taking some omentum coming off the antrum of the stomach and laying that down on top of it and patching the duodenal sweep as a protective measure using some 2-0 silk stitches going from the right retroperitoneum towards the more medial retroperitoneum and gently tacking that down to help patch and cover up the duodenal sweep.    We closed the mesenteric defect between the ileum and the transverse colon using interrupted silk stitches.  I then placed a couple of crotch stitches at the anastomosis as  well, which was up and viable.  The closure defect was covered with omentum and protected, as well.  I did copious irrigation.  Hemostasis was good around the small bowel.  There was no twisting or torsion of the mesentery, otherwise things were well.  I went ahead and removed the ports.  I closed the fascia using #1 running PDS.  I closed skin using some 4-0 Monocryl. Sterile dressings were applied.  The patient has gone to recovery room.  Of note, I did place an On-Q in the preperitoneum prior to fascial closure and then released that down.  I discussed postoperative findings with the patient's family.     Ardeth Sportsman, MD     SCG/MEDQ  D:  06/02/2011  T:  06/03/2011  Job:  [id number]  cc:   Barbette Hair. Arlyce Dice, MD,FACG [address] [place] [address]  Ernestina Penna, M.D. Fax: [phone]

## 2011-06-03 NOTE — Progress Notes (Signed)
Greg Robertson July 17, 1950 [id number]  PCP: Rudi Heap, MD, MD Outpatient Care Team: Patient Care Team: Rudi Heap, MD as PCP - General (Family Medicine) Louis Meckel, MD as Consulting Physician (Gastroenterology)  Inpatient Treatment Team: Treatment Team: Attending Provider: Ardeth Sportsman, MD; Registered Nurse: Romeo Rabon, RN; Registered Nurse: Skipper Cliche, RN; Respiratory Therapist: Arloa Koh, RRT  Subjective:  Feels fine Tol clears.  No N/V Walking well in hallways RN in room Foley out  Objective:  Vital signs:  Temp:  [97.7 F (36.5 C)-98.5 F (36.9 C)] 98.3 F (36.8 C) (02/15 0603) Pulse Rate:  [67-96] 79  (02/15 0603) Resp:  [12-23] 18  (02/15 0603) BP: (136-183)/(63-88) 136/68 mmHg (02/15 0603) SpO2:  [98 %-100 %] 99 % (02/15 0603) Weight:  [168 lb (76.204 kg)] 168 lb (76.204 kg) (02/15 0603)    Intake/Output   Yesterday:  02/14 0701 - 02/15 0700 In: 2850 [I.V.:2850] Out: 645 [Urine:555; Blood:60] This shift:     Bowel function:  Flatus: n  BM: n  Physical Exam:  General: Pt awake/alert/oriented x4 in no acute distress Eyes: PERRL, normal EOM.  Sclera clear.  No icterus Neuro: CN II-XII intact w/o focal sensory/motor deficits. Lymph: No head/neck/groin lymphadenopathy Psych:  No delerium/psychosis/paranoia.  Calm, smiling in bed HENT: Normocephalic, Mucus membranes moist.  No thrush Neck: Supple, No tracheal deviation Chest: Clear.   No chest wall pain w good excursion CV:  Pulses intact.  Regular rhythm Abdomen: Soft, Mildly tender at incisions only.  Nondistended.  No incarcerated hernias. Ext:  SCDs BLE.  No mjr edema.  No cyanosis Skin: No petechiae / purpurae  Results:   Labs: Results for orders placed during the hospital encounter of 06/02/11 (from the past 48 hour(s))  TYPE AND SCREEN     Status: Normal   Collection Time   06/02/11 11:37 AM      Component Value Range Comment   ABO/RH(D) B POS        Antibody Screen NEG      Sample Expiration 06/05/2011     GLUCOSE, CAPILLARY     Status: Abnormal   Collection Time   06/02/11 11:37 AM      Component Value Range Comment   Glucose-Capillary 131 (*) 70 - 99 (mg/dL)   ABO/RH     Status: Normal   Collection Time   06/02/11 12:00 PM      Component Value Range Comment   ABO/RH(D) B POS     GLUCOSE, CAPILLARY     Status: Abnormal   Collection Time   06/02/11  4:24 PM      Component Value Range Comment   Glucose-Capillary 123 (*) 70 - 99 (mg/dL)   GLUCOSE, CAPILLARY     Status: Abnormal   Collection Time   06/02/11  6:43 PM      Component Value Range Comment   Glucose-Capillary 171 (*) 70 - 99 (mg/dL)    Comment 1 Documented in Chart      Comment 2 Notify RN     CBC     Status: Abnormal   Collection Time   06/02/11 10:05 PM      Component Value Range Comment   WBC 9.6  4.0 - 10.5 (K/uL)    RBC 3.92 (*) 4.22 - 5.81 (MIL/uL)    Hemoglobin 9.7 (*) 13.0 - 17.0 (g/dL)    HCT 78.2 (*) 95.6 - 52.0 (%)    MCV 78.3  78.0 - 100.0 (fL)  MCH 24.7 (*) 26.0 - 34.0 (pg)    MCHC 31.6  30.0 - 36.0 (g/dL)    RDW 11.9 (*) 14.7 - 15.5 (%)    Platelets 220  150 - 400 (K/uL)   CREATININE, SERUM     Status: Abnormal   Collection Time   06/02/11 10:05 PM      Component Value Range Comment   Creatinine, Ser 1.00  0.50 - 1.35 (mg/dL)    GFR calc non Af Amer 80 (*) >90 (mL/min)    GFR calc Af Amer >90  >90 (mL/min)   GLUCOSE, CAPILLARY     Status: Abnormal   Collection Time   06/02/11 10:58 PM      Component Value Range Comment   Glucose-Capillary 225 (*) 70 - 99 (mg/dL)   BASIC METABOLIC PANEL     Status: Abnormal   Collection Time   06/03/11  4:32 AM      Component Value Range Comment   Sodium 135  135 - 145 (mEq/L)    Potassium 4.8  3.5 - 5.1 (mEq/L)    Chloride 99  96 - 112 (mEq/L)    CO2 26  19 - 32 (mEq/L)    Glucose, Bld 263 (*) 70 - 99 (mg/dL)    BUN 21  6 - 23 (mg/dL)    Creatinine, Ser 8.29  0.50 - 1.35 (mg/dL)    Calcium 8.7  8.4 -  10.5 (mg/dL)    GFR calc non Af Amer 71 (*) >90 (mL/min)    GFR calc Af Amer 82 (*) >90 (mL/min)   CBC     Status: Abnormal   Collection Time   06/03/11  4:32 AM      Component Value Range Comment   WBC 5.7  4.0 - 10.5 (K/uL)    RBC 3.72 (*) 4.22 - 5.81 (MIL/uL)    Hemoglobin 9.0 (*) 13.0 - 17.0 (g/dL)    HCT 56.2 (*) 13.0 - 52.0 (%)    MCV 78.2  78.0 - 100.0 (fL)    MCH 24.2 (*) 26.0 - 34.0 (pg)    MCHC 30.9  30.0 - 36.0 (g/dL)    RDW 86.5 (*) 78.4 - 15.5 (%)    Platelets 226  150 - 400 (K/uL)   GLUCOSE, CAPILLARY     Status: Abnormal   Collection Time   06/03/11  7:37 AM      Component Value Range Comment   Glucose-Capillary 206 (*) 70 - 99 (mg/dL)    Comment 1 Notify RN      Comment 2 Documented in Chart       Imaging / Studies: No results found.  Medications / Allergies: per chart  Antibiotics: Anti-infectives     Start     Dose/Rate Route Frequency Ordered Stop   06/02/11 1130   ertapenem (INVANZ) 1 g in sodium chloride 0.9 % 50 mL IVPB        1 g 100 mL/hr over 30 Minutes Intravenous 60 min pre-op 06/02/11 1118 06/02/11 1500          Greg  Ha D Robertson  61 y.o. male  1 Day Post-Op  Procedure(s): LAPAROSCOPIC PARTIAL COLECTOMY  Problem List:  Principal Problem:  *Cancer of ascending colon, 7cm Active Problems:  Diabetes mellitus without mention of complication  Glaucoma   Recovering well  Plan: -adv diet gradually -wean IVF  -bowel regimen / anti-ileus protocol -close DM control - inc home insulin since advancing diet well -VTE prophylaxis- SCDs, etc -mobilize  as tolerated to help recovery -f/u path  Ardeth Sportsman, M.D., F.A.C.S. Gastrointestinal and Minimally Invasive Surgery [facility] [address], [address] [place], [place] [address] [phone] Main / Paging [phone] Voice Mail   06/03/2011

## 2011-06-04 DIAGNOSIS — E119 Type 2 diabetes mellitus without complications: Secondary | ICD-10-CM

## 2011-06-04 LAB — HEMOGLOBIN A1C
Hgb A1c MFr Bld: 8.4 % — ABNORMAL HIGH (ref <5.7)
Mean Plasma Glucose: 194 mg/dL — ABNORMAL HIGH (ref <117)

## 2011-06-04 LAB — GLUCOSE, CAPILLARY
Glucose-Capillary: 131 mg/dL — ABNORMAL HIGH (ref 70–99)
Glucose-Capillary: 95 mg/dL (ref 70–99)
Glucose-Capillary: 201 mg/dL — ABNORMAL HIGH (ref 70–99)
Glucose-Capillary: 332 mg/dL — ABNORMAL HIGH (ref 70–99)

## 2011-06-04 MED ORDER — INSULIN ASPART 100 UNIT/ML ~~LOC~~ SOLN
0.0000 [IU] | Freq: Three times a day (TID) | SUBCUTANEOUS | Status: DC
  Filled 2011-06-04: qty 3

## 2011-06-04 MED ORDER — BISACODYL 10 MG RE SUPP
10.0000 mg | Freq: Once | RECTAL | Status: AC
  Administered 2011-06-04: 10 mg via RECTAL
  Filled 2011-06-04: qty 1

## 2011-06-04 NOTE — Progress Notes (Signed)
2 Days Post-Op  Subjective: He is awake and alert he states that he feels better today. Voiding without difficulty. No flatus. No nausea.  CBG's with better control now, 95, 96. On SSI.  Objective: Vital signs in last 24 hours: Temp:  [98.3 F (36.8 C)-98.5 F (36.9 C)] 98.4 F (36.9 C) (02/16 0500) Pulse Rate:  [83-96] 90  (02/16 0500) Resp:  [16-20] 17  (02/16 0500) BP: (130-170)/(63-81) 130/72 mmHg (02/16 0500) SpO2:  [97 %-100 %] 97 % (02/16 0500) Last BM Date: 06/02/11  Intake/Output from previous day: 02/15 0701 - 02/16 0700 In: 480 [P.O.:480] Out: 1650 [Urine:1650] Intake/Output this shift: Total I/O In: -  Out: 325 [Urine:325]  General appearance: alert. Oriented. Pleasant. In no distress. Resp: clear to auscultation bilaterally GI: soft. Minimal bowel sounds. Minimal tenderness. Wounds are okay. On-Q in place.  Lab Results:  Results for orders placed during the hospital encounter of 06/02/11 (from the past 24 hour(s))  GLUCOSE, CAPILLARY     Status: Abnormal   Collection Time   06/03/11 12:11 PM      Component Value Range   Glucose-Capillary 238 (*) 70 - 99 (mg/dL)   Comment 1 Notify RN     Comment 2 Documented in Chart    GLUCOSE, CAPILLARY     Status: Abnormal   Collection Time   06/03/11  5:08 PM      Component Value Range   Glucose-Capillary 173 (*) 70 - 99 (mg/dL)   Comment 1 Notify RN     Comment 2 Documented in Chart    GLUCOSE, CAPILLARY     Status: Abnormal   Collection Time   06/03/11  8:02 PM      Component Value Range   Glucose-Capillary 193 (*) 70 - 99 (mg/dL)   Comment 1 Documented in Chart     Comment 2 Notify RN    GLUCOSE, CAPILLARY     Status: Abnormal   Collection Time   06/03/11 11:54 PM      Component Value Range   Glucose-Capillary 131 (*) 70 - 99 (mg/dL)  GLUCOSE, CAPILLARY     Status: Normal   Collection Time   06/04/11  3:53 AM      Component Value Range   Glucose-Capillary 95  70 - 99 (mg/dL)  GLUCOSE, CAPILLARY     Status:  Normal   Collection Time   06/04/11  7:41 AM      Component Value Range   Glucose-Capillary 96  70 - 99 (mg/dL)     Studies/Results: @RISRSLT24 @     . alvimopan  12 mg Oral BID  . aspirin  81 mg Oral Daily  . brimonidine  1 drop Both Eyes BID  . Flora-Q  1 capsule Oral Daily  . fluticasone  1 spray Each Nare Daily  . heparin  5,000 Units Subcutaneous Q8H  . influenza  inactive virus vaccine  0.5 mL Intramuscular Tomorrow-1000  . insulin aspart  0-20 Units Subcutaneous Q4H  . insulin glargine  22 Units Subcutaneous QHS  . latanoprost  1 drop Both Eyes QHS  . lisinopril  2.5 mg Oral Daily  . loratadine  10 mg Oral Daily  . mulitivitamin with minerals  1 tablet Oral Daily  . pneumococcal 23 valent vaccine  0.5 mL Intramuscular Tomorrow-1000  . psyllium  1 packet Oral BID  . timolol  1 drop Both Eyes BID  . vitamin C  500 mg Oral Daily  . DISCONTD: insulin aspart  0-15 Units Subcutaneous  TID WC     Assessment/Plan: s/p Procedure(s): LAPAROSCOPIC PARTIAL COLECTOMY  Stable. Await resolution of ileus. Continue full liquid diet until passing flatus. Continue IVF's. Diabetes control good. Continue same SSI protocol. Continue ambulating in the hall. Check pathology.      LOS: 2 days    Greg Robertson 06/04/2011  . .prob

## 2011-06-05 LAB — GLUCOSE, CAPILLARY: Glucose-Capillary: 118 mg/dL — ABNORMAL HIGH (ref 70–99)

## 2011-06-05 LAB — GLUCOSE, RANDOM: Glucose, Bld: 439 mg/dL — ABNORMAL HIGH (ref 70–99)

## 2011-06-05 MED ORDER — INSULIN ASPART 100 UNIT/ML ~~LOC~~ SOLN
0.0000 [IU] | Freq: Three times a day (TID) | SUBCUTANEOUS | Status: DC
  Administered 2011-06-06: 5 [IU] via SUBCUTANEOUS
  Administered 2011-06-06: 3 [IU] via SUBCUTANEOUS
  Administered 2011-06-06: 2 [IU] via SUBCUTANEOUS
  Administered 2011-06-07: 3 [IU] via SUBCUTANEOUS
  Administered 2011-06-07: 8 [IU] via SUBCUTANEOUS
  Filled 2011-06-05: qty 3

## 2011-06-05 MED ORDER — INSULIN ASPART 100 UNIT/ML ~~LOC~~ SOLN: 0.0000 [IU] | Freq: Every day | SUBCUTANEOUS | Status: DC

## 2011-06-05 MED ORDER — INSULIN ASPART 100 UNIT/ML ~~LOC~~ SOLN
0.0000 [IU] | Freq: Three times a day (TID) | SUBCUTANEOUS | Status: DC
  Administered 2011-06-05: 20 [IU] via SUBCUTANEOUS

## 2011-06-05 MED ORDER — INSULIN REGULAR HUMAN 100 UNIT/ML IJ SOLN: 20.0000 [IU] | Freq: Once | INTRAMUSCULAR | Status: DC

## 2011-06-05 MED ORDER — SODIUM CHLORIDE 0.9 % IJ SOLN
3.0000 mL | Freq: Two times a day (BID) | INTRAMUSCULAR | Status: DC
  Administered 2011-06-06 – 2011-06-07 (×4): 3 mL via INTRAVENOUS

## 2011-06-05 MED ORDER — DEXTROSE 50 % IV SOLN
INTRAVENOUS | Status: AC
  Administered 2011-06-05: 25 mL
  Filled 2011-06-05: qty 50

## 2011-06-05 MED ORDER — GLUCOSE-VITAMIN C 4-6 GM-MG PO CHEW
CHEWABLE_TABLET | ORAL | Status: AC
  Filled 2011-06-05: qty 1

## 2011-06-05 MED ORDER — SODIUM CHLORIDE 0.9 % IJ SOLN: 3.0000 mL | INTRAMUSCULAR | Status: DC | PRN

## 2011-06-05 MED ORDER — DEXTROSE IN LACTATED RINGERS 5 % IV SOLN
INTRAVENOUS | Status: DC
  Administered 2011-06-05: 19:00:00 via INTRAVENOUS

## 2011-06-05 MED ORDER — INSULIN ASPART 100 UNIT/ML ~~LOC~~ SOLN
0.0000 [IU] | Freq: Three times a day (TID) | SUBCUTANEOUS | Status: DC
Start: 2011-06-06 — End: 2011-06-05
  Filled 2011-06-05: qty 3

## 2011-06-05 MED ORDER — INSULIN ASPART 100 UNIT/ML ~~LOC~~ SOLN
20.0000 [IU] | Freq: Once | SUBCUTANEOUS | Status: AC
  Administered 2011-06-05: 6 [IU] via SUBCUTANEOUS
  Filled 2011-06-05: qty 3

## 2011-06-05 MED ORDER — INSULIN ASPART 100 UNIT/ML ~~LOC~~ SOLN: 0.0000 [IU] | Freq: Three times a day (TID) | SUBCUTANEOUS | Status: DC

## 2011-06-05 MED ORDER — SODIUM CHLORIDE 0.9 % IV SOLN: 250.0000 mL | INTRAVENOUS | Status: DC | PRN

## 2011-06-05 NOTE — Progress Notes (Signed)
Patient's capillary blood glucose at 32 at 17:00. RN informed and patient given 1 tube of instant glucose while RN obtained IV access. CBG rechecked at 17:15 and was 38. Patient wanted the RN to hold off on taking any further action for another 15 min. RN complied with patient's wishes. CBG rechecked at 17:30 and was 47. RN then informed patient that she would be administering 1/2 amp of D50 per protocol. Patient agreeable to this action. D50 administered at 17:35. Will monitor and recheck CBG at 17:50.

## 2011-06-05 NOTE — Progress Notes (Signed)
Dr. Derrell Lolling informed of patient's low blood glucose. Orders received to start D5LR at 50mL/hr and change SSI back to the original order this am.

## 2011-06-05 NOTE — Progress Notes (Signed)
Blood glucose recheck at 17:50, 118

## 2011-06-05 NOTE — Progress Notes (Signed)
3 Days Post-Op  Subjective: He is eating a little bit better. His CABG's are in the 200 up to 300 range despite sliding scale insulin and Lantus at bedtime.  He did have one bowel movement. He is fearful of going home today because he lives in [place] and doesn't feel completely recovered. He is voiding okay.  Objective: Vital signs in last 24 hours: Temp:  [98.3 F (36.8 C)-99.4 F (37.4 C)] 98.5 F (36.9 C) (02/17 0600) Pulse Rate:  [68-99] 68  (02/17 0600) Resp:  [18] 18  (02/17 0600) BP: (130-158)/(72-82) 130/72 mmHg (02/17 0600) SpO2:  [98 %-99 %] 98 % (02/17 0600) Last BM Date: 06/04/11  Intake/Output from previous day: 02/16 0701 - 02/17 0700 In: 1020 [P.O.:900; I.V.:120] Out: 1475 [Urine:1475] Intake/Output this shift:    General appearance: alert GI: abdomen is soft and the wounds look good. On-Q pump in place. Bowel sounds present.  Lab Results:  Results for orders placed during the hospital encounter of 06/02/11 (from the past 24 hour(s))  GLUCOSE, CAPILLARY     Status: Abnormal   Collection Time   06/04/11 12:01 PM      Component Value Range   Glucose-Capillary 201 (*) 70 - 99 (mg/dL)  GLUCOSE, CAPILLARY     Status: Abnormal   Collection Time   06/04/11  3:38 PM      Component Value Range   Glucose-Capillary 332 (*) 70 - 99 (mg/dL)  GLUCOSE, CAPILLARY     Status: Abnormal   Collection Time   06/04/11 10:04 PM      Component Value Range   Glucose-Capillary 236 (*) 70 - 99 (mg/dL)     Studies/Results: @RISRSLT24 @     . alvimopan  12 mg Oral BID  . aspirin  81 mg Oral Daily  . bisacodyl  10 mg Rectal Once  . brimonidine  1 drop Both Eyes BID  . Flora-Q  1 capsule Oral Daily  . fluticasone  1 spray Each Nare Daily  . heparin  5,000 Units Subcutaneous Q8H  . influenza  inactive virus vaccine  0.5 mL Intramuscular Tomorrow-1000  . insulin aspart  0-20 Units Subcutaneous TID WC  . insulin glargine  22 Units Subcutaneous QHS  . latanoprost  1  drop Both Eyes QHS  . lisinopril  2.5 mg Oral Daily  . loratadine  10 mg Oral Daily  . mulitivitamin with minerals  1 tablet Oral Daily  . pneumococcal 23 valent vaccine  0.5 mL Intramuscular Tomorrow-1000  . psyllium  1 packet Oral BID  . timolol  1 drop Both Eyes BID  . vitamin C  500 mg Oral Daily  . DISCONTD: insulin aspart  0-20 Units Subcutaneous Q4H     Assessment/Plan: s/p Procedure(s): LAPAROSCOPIC PARTIAL COLECTOMY  Satisfactory progress postop. No apparent complications. Ileus resolving.  Diabetic diet  Discontinue On-Q pump and allow shower Insulin sliding scale adjusted upward. Check pathology Anticipate discharge tomorrow.    LOS: 3 days    Anikin Prosser M 06/05/2011  . .prob

## 2011-06-05 NOTE — Progress Notes (Signed)
   Patient ID: LAKAI MOREE, male   DOB: 1950/09/11, 61 y.o.   MRN: [id number]   CBG's very labile today. Low of 38, high greater than 400. Otherwise doing well.  Dr. Joneen Roach of [facility] contacted and has agreed to evaluate for insulin management.   Angelia Mould. Derrell Lolling, M.D., [facility] General and Minimally invasive Surgery Breast and Colorectal Surgery Office:   [phone] Pager:   [phone]

## 2011-06-05 NOTE — Progress Notes (Signed)
Pt's CBG was 404. MD notified. Orders given. MD ordered 20 units novolog insulin, but pt only wanted to take 6 units and refused to take the whole 20 units novolog. Pt did take his Lantus insulin that was scheduled. Will check cbg again at 2300 and tomorrow at 0200 per orders. Greg Robertson

## 2011-06-06 LAB — GLUCOSE, CAPILLARY
Glucose-Capillary: 308 mg/dL — ABNORMAL HIGH (ref 70–99)
Glucose-Capillary: 133 mg/dL — ABNORMAL HIGH (ref 70–99)
Glucose-Capillary: 146 mg/dL — ABNORMAL HIGH (ref 70–99)

## 2011-06-06 MED ORDER — INSULIN ASPART 100 UNIT/ML ~~LOC~~ SOLN
4.0000 [IU] | Freq: Three times a day (TID) | SUBCUTANEOUS | Status: DC
  Administered 2011-06-06 – 2011-06-07 (×4): 4 [IU] via SUBCUTANEOUS

## 2011-06-06 NOTE — Consult Note (Signed)
Reason for Consult: Diabetes management. Referring Physician: Dr. Derrell Lolling orthopedics.  Greg Robertson is an 61 y.o. male.  HPI: Greg Robertson is a 61 year old male admitted with colon cancer. He is status post partial colectomy under Dr. Derrell Lolling. He has done well postoperatively advanced now to a carbohydrate modified diet. He does have a prior history of diabetes and we are consulted due to labile blood glucose control with some hyperglycemia as well as hypoglycemia. The patient is normally managed at home with Lantus 22 units subcutaneous daily at bedtime as well as a NovoLog sliding scale formula that includes total meal carbohydrates and total blood sugar calculation. He is currently on his home dose of Lantus and a resistant sliding scale NovoLog 3 times daily with meals. He is also on D5 lactated Ringer's at 20 cc per hour. It appears that he had a blood glucose in the mid 300 range and was given a one-time order for 20 units of NovoLog. He then had some hypoglycemia into the 40s.  Past Medical History  Diagnosis Date  . Diabetes mellitus   . Glaucoma   . Allergic rhinitis   . Prostate nodule   . Colonic mass   . Cancer   . Substance abuse   . Cancer of ascending colon, 7cm 05/25/2011  . Hypertension   . Pneumonia 1979 or 1980  . Anemia     Past Surgical History  Procedure Date  . Finger surgery 2009    right middle  . Nasal fracture surgery 1968  . Tonsillectomy 1957 - approximate    Family History  Problem Relation Age of Onset  . Colon polyps Father   . Lung cancer Father   . Diabetes Father   . Breast cancer Mother   . Pancreatitis Mother     intestinal adhesions    Social History:  reports that he quit smoking about 18 years ago. His smoking use included Cigarettes. He has a 15 pack-year smoking history. He has never used smokeless tobacco. He reports that he drinks alcohol. He reports that he uses illicit drugs (Marijuana).  Allergies: No Known  Allergies  Medications: Reviewed per current MAR  Results for orders placed during the hospital encounter of 06/02/11 (from the past 48 hour(s))  HEMOGLOBIN A1C     Status: Abnormal   Collection Time   06/04/11  3:52 AM      Component Value Range Comment   Hemoglobin A1C 8.4 (*) <5.7 (%)    Mean Plasma Glucose 194 (*) <117 (mg/dL)   GLUCOSE, CAPILLARY     Status: Normal   Collection Time   06/04/11  3:53 AM      Component Value Range Comment   Glucose-Capillary 95  70 - 99 (mg/dL)   GLUCOSE, CAPILLARY     Status: Normal   Collection Time   06/04/11  7:41 AM      Component Value Range Comment   Glucose-Capillary 96  70 - 99 (mg/dL)   GLUCOSE, CAPILLARY     Status: Abnormal   Collection Time   06/04/11 12:01 PM      Component Value Range Comment   Glucose-Capillary 201 (*) 70 - 99 (mg/dL)   GLUCOSE, CAPILLARY     Status: Abnormal   Collection Time   06/04/11  3:38 PM      Component Value Range Comment   Glucose-Capillary 332 (*) 70 - 99 (mg/dL)   GLUCOSE, CAPILLARY     Status: Abnormal   Collection Time   06/04/11  10:04 PM      Component Value Range Comment   Glucose-Capillary 236 (*) 70 - 99 (mg/dL)   GLUCOSE, CAPILLARY     Status: Abnormal   Collection Time   06/05/11  7:30 AM      Component Value Range Comment   Glucose-Capillary 114 (*) 70 - 99 (mg/dL)   GLUCOSE, CAPILLARY     Status: Abnormal   Collection Time   06/05/11 11:55 AM      Component Value Range Comment   Glucose-Capillary 335 (*) 70 - 99 (mg/dL)   GLUCOSE, CAPILLARY     Status: Abnormal   Collection Time   06/05/11  4:55 PM      Component Value Range Comment   Glucose-Capillary 38 (*) 70 - 99 (mg/dL)    Comment 1 Notify RN     GLUCOSE, CAPILLARY     Status: Abnormal   Collection Time   06/05/11  5:14 PM      Component Value Range Comment   Glucose-Capillary 40 (*) 70 - 99 (mg/dL)   GLUCOSE, CAPILLARY     Status: Abnormal   Collection Time   06/05/11  5:31 PM      Component Value Range Comment    Glucose-Capillary 47 (*) 70 - 99 (mg/dL)   GLUCOSE, CAPILLARY     Status: Abnormal   Collection Time   06/05/11  5:52 PM      Component Value Range Comment   Glucose-Capillary 118 (*) 70 - 99 (mg/dL)   GLUCOSE, CAPILLARY     Status: Abnormal   Collection Time   06/05/11  9:43 PM      Component Value Range Comment   Glucose-Capillary 404 (*) 70 - 99 (mg/dL)   GLUCOSE, RANDOM     Status: Abnormal   Collection Time   06/05/11 10:40 PM      Component Value Range Comment   Glucose, Bld 439 (*) 70 - 99 (mg/dL)   GLUCOSE, CAPILLARY     Status: Abnormal   Collection Time   06/05/11 11:08 PM      Component Value Range Comment   Glucose-Capillary 418 (*) 70 - 99 (mg/dL)    Review of systems. General. States she feels well postoperatively. Cardiac. Denies chest pain, edema, dyspnea. Lungs. Denies dyspnea or chest pain. Abdomen. Denies nausea or vomiting. Still has some postoperative abdominal pain. Urinary genital. Denies dysuria or other symptoms suggestive UTI. Musculoskeletal denies myalgias or arthralgias. States he is ambulatory. Neurologic denies any unilateral or focal changes. Denies history of stroke or seizure.  Blood pressure 136/74, pulse 97, temperature 98.7 F (37.1 C), temperature source Oral, resp. rate 18, height 6\' 3"  (1.905 m), weight 76.204 kg (168 lb), SpO2 97.00%.   General Appearance. Well-developed male in no acute distress. Alert and oriented. Cardiac. Rate and rhythm regular. No jugular venous distention or edema. Negative Homans. Lungs. Clear and equal bilaterally. No dyspnea. Abdomen. Surgical dressings are in place. They're clean and dry. Bowel sounds are present in all 4 quadrants. There is some pain with palpation generally postoperatively. Urinary genital no bladder pain or CVA tenderness. Musculoskeletal. Range of motion is full. Strength equal all 4 extremities. Neurologic. Cranial nerves 2-12 grossly intact. Patient is alert and oriented. No focal  defects.   Assessment/Plan: Problem #1. Uncontrolled diabetes. I have continued the patient's Lantus unchanged. Now that he is on a full diet I don't expect his blood sugars to continue hypoglycemic. For this reason I have discontinued his dextrose containing IV fluids.  Continue his Lantus unchanged at 22 units at bedtime. I have modified the patient to a moderate NovoLog sliding scale a.c. and at bedtime which I suspect most closely represents his home dosing. Will follow along for diabetes management and reassess per rounding physician in a.m. We do appreciate this consult from Dr. Derrell Lolling and it was a pleasure interacting with this pleasant gentleman Mr. Destin.  Rolan Lipa 06/06/2011, 12:09 AM

## 2011-06-06 NOTE — Progress Notes (Signed)
Greg Robertson 11/05/1950 [id number]  PCP: Robertson, DONALD, MD, MD Outpatient Care Team: Patient Care Team: Greg Moore, MD as PCP - General (Family Medicine) Greg D Kaplan, MD as Consulting Physician (Gastroenterology)  Inpatient Treatment Team: Treatment Team: Attending Provider: Rossie Scarfone C. Lelania Bia, MD; Registered Nurse: Greg Williams Debrew, RN; Registered Nurse: Greg Alexander, RN; Technician: Greg Robertson, NT; Technician: Greg Robertson, NT; Registered Nurse: Greg Wilfong Greenfield, RN; Rounding Team: [facility], MD; Consulting Physician: Greg K Rai, MD; Respiratory Therapist: Kelly Robertson, RT  Subjective: Glucose more variable - big shifts w glucose vs insulin IM consulted - help appreciated Feels fine Tol solids.  No N/V Walking well in hallways  Objective:  Vital signs:  Temp:  [98.2 F (36.8 C)-98.8 F (37.1 C)] 98.2 F (36.8 C) (02/18 0539) Pulse Rate:  [67-100] 67  (02/18 0539) Resp:  [18] 18  (02/18 0539) BP: (127-149)/(69-83) 127/69 mmHg (02/18 0539) SpO2:  [97 %-100 %] 97 % (02/18 0539) Last BM Date: 06/04/11  Intake/Output   Yesterday:  02/17 0701 - 02/18 0700 In: 520 [P.O.:480; I.V.:40] Out: 1500 [Urine:1500] This shift:     Bowel function:  Flatus: Yes, "a little"  BM: N  Physical Exam:  General: Pt awake/alert/oriented x4 in no acute distress Eyes: PERRL, normal EOM.  Sclera clear.  No icterus Neuro: CN II-XII intact w/o focal sensory/motor deficits. Lymph: No head/neck/groin lymphadenopathy Psych:  No delerium/psychosis/paranoia.  Calm, smiling in bed HENT: Normocephalic, Mucus membranes moist.  No thrush Neck: Supple, No tracheal deviation Chest: Clear.   No chest wall pain w good excursion CV:  Pulses intact.  Regular rhythm Abdomen: Soft, Mildly tender at incisions only.  Incisions c/d/i/.  Nondistended.  No incarcerated hernias. Ext:  SCDs BLE.  No mjr edema.  No cyanosis Skin: No petechiae /  purpurae  Results:   Labs: Results for orders placed during the hospital encounter of 06/02/11 (from the past 48 hour(s))  GLUCOSE, CAPILLARY     Status: Normal   Collection Time   06/04/11  7:41 AM      Component Value Range Comment   Glucose-Capillary 96  70 - 99 (mg/dL)   GLUCOSE, CAPILLARY     Status: Abnormal   Collection Time   06/04/11 12:01 PM      Component Value Range Comment   Glucose-Capillary 201 (*) 70 - 99 (mg/dL)   GLUCOSE, CAPILLARY     Status: Abnormal   Collection Time   06/04/11  3:38 PM      Component Value Range Comment   Glucose-Capillary 332 (*) 70 - 99 (mg/dL)   GLUCOSE, CAPILLARY     Status: Abnormal   Collection Time   06/04/11 10:04 PM      Component Value Range Comment   Glucose-Capillary 236 (*) 70 - 99 (mg/dL)   GLUCOSE, CAPILLARY     Status: Abnormal   Collection Time   06/05/11  7:30 AM      Component Value Range Comment   Glucose-Capillary 114 (*) 70 - 99 (mg/dL)   GLUCOSE, CAPILLARY     Status: Abnormal   Collection Time   06/05/11 11:55 AM      Component Value Range Comment   Glucose-Capillary 335 (*) 70 - 99 (mg/dL)   GLUCOSE, CAPILLARY     Status: Abnormal   Collection Time   06/05/11  4:55 PM      Component Value Range Comment   Glucose-Capillary 38 (*) 70 - 99 (mg/dL)    Comment 1   Notify RN     GLUCOSE, CAPILLARY     Status: Abnormal   Collection Time   06/05/11  5:14 PM      Component Value Range Comment   Glucose-Capillary 40 (*) 70 - 99 (mg/dL)   GLUCOSE, CAPILLARY     Status: Abnormal   Collection Time   06/05/11  5:31 PM      Component Value Range Comment   Glucose-Capillary 47 (*) 70 - 99 (mg/dL)   GLUCOSE, CAPILLARY     Status: Abnormal   Collection Time   06/05/11  5:52 PM      Component Value Range Comment   Glucose-Capillary 118 (*) 70 - 99 (mg/dL)   GLUCOSE, CAPILLARY     Status: Abnormal   Collection Time   06/05/11  9:43 PM      Component Value Range Comment   Glucose-Capillary 404 (*) 70 - 99 (mg/dL)   GLUCOSE,  RANDOM     Status: Abnormal   Collection Time   06/05/11 10:40 PM      Component Value Range Comment   Glucose, Bld 439 (*) 70 - 99 (mg/dL)   GLUCOSE, CAPILLARY     Status: Abnormal   Collection Time   06/05/11 11:08 PM      Component Value Range Comment   Glucose-Capillary 418 (*) 70 - 99 (mg/dL)   GLUCOSE, CAPILLARY     Status: Abnormal   Collection Time   06/06/11  1:59 AM      Component Value Range Comment   Glucose-Capillary 308 (*) 70 - 99 (mg/dL)     Imaging / Studies: No results found.  Medications / Allergies: per chart  Antibiotics: Anti-infectives     Start     Dose/Rate Route Frequency Ordered Stop   06/02/11 1130   ertapenem (INVANZ) 1 g in sodium chloride 0.9 % 50 mL IVPB        1 g 100 mL/hr over 30 Minutes Intravenous 60 min pre-op 06/02/11 1118 06/02/11 1500          Assessment  Greg Robertson  60 y.o. male  4 Days Post-Op  Procedure(s): LAPAROSCOPIC PARTIAL COLECTOMY  Problem List:  Principal Problem:  *Cancer of ascending colon, 7cm Active Problems:  Diabetes mellitus without mention of complication  Glaucoma  Recovering okay but glc control brittle  Plan: -solid diet as tolerated -bowel regimen / anti-ileus protocol -close DM control - IM help appreciated -VTE prophylaxis- SCDs, etc  -hold off on D/C until under better control -mobilize as tolerated to help recovery -f/u path  Greg Robertson, M.D., F.A.C.S. Gastrointestinal and Minimally Invasive Surgery [facility] [address], [address] [place], [place] [address] [phone] Main / Paging [phone] Voice Mail   06/06/2011   

## 2011-06-06 NOTE — Consult Note (Signed)
I have reviewed Mr Greg Robertson's note, saw Mr Greg Robertson, and agree with the evaluation, recommendations and plans. Patient is now able to eat full meals.  His Lantus is now at his baseline amount and he is under moderate instead of resistant scale.  He does not have labile BS at home, so I think he will do better with more stabalization of his BS.

## 2011-06-06 NOTE — Progress Notes (Signed)
Patient was seen and examined by myself, reviewed Dr. Irwin Brakeman consultation note from today morning - Agree with recommendations per Dr. Conley Rolls - I also discussed in detail with the patient, he usually takes NovoLog 8-11 units average per meal, and Lantus 22 units bedtime - I have added mealtime coverage for units 3 times a day, continue sliding scale for correction factor and Lantus. -will follow up in a.m. for 24 hours blood sugar control for any further changes.   Suhani Stillion M.D. [facility] 06/06/2011, 11:10 AM  Pager: [phone]

## 2011-06-07 ENCOUNTER — Telehealth (INDEPENDENT_AMBULATORY_CARE_PROVIDER_SITE_OTHER): Payer: Self-pay

## 2011-06-07 DIAGNOSIS — C189 Malignant neoplasm of colon, unspecified: Secondary | ICD-10-CM

## 2011-06-07 NOTE — Telephone Encounter (Signed)
Called pathology to add pt to GI Cancer Conf. For 06-15-11. Yehuda Mao will add pt to the list.

## 2011-06-07 NOTE — Progress Notes (Signed)
Patient ID: Greg Robertson    JYN:[id number]    DOB: 1950-10-03    DOA: 06/02/2011  PCP: Rudi Heap, MD, MD  Subjective: Patient seen and examined earlier this morning, Doing well, tolerating diet ambulating in the hallway. BS improved control  Objective: Weight change:   Intake/Output Summary (Last 24 hours) at 06/07/11 1344 Last data filed at 06/07/11 0538  Gross per 24 hour  Intake    240 ml  Output   1800 ml  Net  -1560 ml   Blood pressure 110/68, pulse 73, temperature 98 F (36.7 C), temperature source Oral, resp. rate 18, height 6\' 3"  (1.905 m), weight 76.204 kg (168 lb), SpO2 99.00%.  Physical Exam: General: Alert and awake, oriented x3, not in any acute distress. HEENT: anicteric sclera, pupils reactive to light and accommodation, EOMI CVS: S1-S2 clear, no murmur rubs or gallops Chest: clear to auscultation bilaterally, no wheezing, rales or rhonchi Abdomen: Surgical dressing in place Extremities: no cyanosis, clubbing or edema noted bilaterally Neuro: Cranial nerves II-XII intact, no focal neurological deficits  Lab Results: Basic Metabolic Panel:  Lab 06/05/11 8657 06/03/11 0432 06/02/11 2205  NA -- 135 --  K -- 4.8 --  CL -- 99 --  CO2 -- 26 --  GLUCOSE 439* 263* --  BUN -- 21 --  CREATININE -- 1.10 1.00  CALCIUM -- 8.7 --  MG -- -- --  PHOS -- -- --   Liver Function Tests: No results found for this basename: AST:2,ALT:2,ALKPHOS:2,BILITOT:2,PROT:2,ALBUMIN:2 in the last 168 hours No results found for this basename: LIPASE:2,AMYLASE:2 in the last 168 hours No results found for this basename: AMMONIA:2 in the last 168 hours CBC:  Lab 06/03/11 0432 06/02/11 2205  WBC 5.7 9.6  NEUTROABS -- --  HGB 9.0* 9.7*  HCT 29.1* 30.7*  MCV 78.2 78.3  PLT 226 220   CBG:  Lab 06/07/11 1249 06/07/11 0812 06/06/11 2147 06/06/11 1557 06/06/11 1213  GLUCAP 296* 156* 146* 133* 192*     Micro Results: Recent Results (from the past 240 hour(s))  SURGICAL PCR  SCREEN     Status: Normal   Collection Time   05/30/11  9:10 AM      Component Value Range Status Comment   MRSA, PCR NEGATIVE  NEGATIVE  Final    Staphylococcus aureus NEGATIVE  NEGATIVE  Final     Studies/Results: Dg Chest 2 View  05/30/2011  *RADIOLOGY REPORT*  Clinical Data: Preop exam.  CHEST - 2 VIEW  Comparison: None  Findings: The heart size and mediastinal contours are within normal limits.  Both lungs are clear.  The visualized skeletal structures are unremarkable.  IMPRESSION: Negative exam.  Original Report Authenticated By: Rosealee Albee, M.D.   Ct Abdomen Pelvis W Contrast  05/19/2011  **ADDENDUM** CREATED: 05/19/2011 11:01:20  Impression #1 below contains a typographical error, and should read "ascending" colon, as stated in the Findings section.  **END ADDENDUM** SIGNED BY: John A. Eppie Gibson, M.D.    05/19/2011  *RADIOLOGY REPORT*  Clinical Data: Right lower quadrant pain.  Lower GI bleeding. 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  CT ABDOMEN AND PELVIS WITH CONTRAST  Technique:  Multidetector CT imaging of the abdomen and pelvis was performed following the standard protocol during bolus administration of intravenous contrast.  Contrast: OMNIPAQUE IOHEXOL 300 MG/ML IV SOLN  Comparison: None.  Findings: A large annular constricting mass is seen involving the ascending colon and hepatic flexure which measures 6.4 x 7.9 cm. There is adjacent  soft tissue stranding in the pericolonic fat, consistent with extra colonic extension. Enlarged lymph nodes are seen in the right pericolonic fat and right abdominal mesentery which measure up to 13 mm, consistent with metastatic disease. Shotty less than 1 cm retroperitoneal lymph nodes are seen within the aortocaval and left para-aortic spaces, which are nonspecific. There is no evidence of bowel obstruction.  No evidence of ascites.  No liver masses are identified.  Gallbladder is unremarkable.  The pancreas, spleen, adrenal glands,  and kidneys are normal in appearance.  No evidence of hydronephrosis.  No inflammatory process or abscess identified. Visualized portions of the lung bases are clear.  IMPRESSION:  1. 6.4 x 7.9 cm annular constricting mass involving the descending colon and hepatic flexure, with extra colonic extension. 2.  Right pericolonic and mesenteric lymphadenopathy, consistent with metastatic disease. Nonspecific shotty retroperitoneal lymph nodes in the aortocaval and left para-aortic spaces. 3.  No evidence of liver metastases or other distant metastatic disease.  Original Report Authenticated By: Danae Orleans, M.D.    Medications: Scheduled Meds:   . alvimopan  12 mg Oral BID  . aspirin  81 mg Oral Daily  . brimonidine  1 drop Both Eyes BID  . fluticasone  1 spray Each Nare Daily  . heparin  5,000 Units Subcutaneous Q8H  . insulin aspart  0-15 Units Subcutaneous TID WC  . insulin aspart  0-5 Units Subcutaneous QHS  . insulin aspart  4 Units Subcutaneous TID WC  . insulin glargine  22 Units Subcutaneous QHS  . latanoprost  1 drop Both Eyes QHS  . lisinopril  2.5 mg Oral Daily  . loratadine  10 mg Oral Daily  . mulitivitamin with minerals  1 tablet Oral Daily  . psyllium  1 packet Oral BID  . sodium chloride  3 mL Intravenous Q12H  . timolol  1 drop Both Eyes BID  . vitamin C  500 mg Oral Daily  . DISCONTD: Flora-Q  1 capsule Oral Daily   Continuous Infusions:    Assessment/Plan: Principal Problem:  *Cancer of ascending colon, 7cm: - Per surgery service  Active Problems:  Diabetes mellitus - Patient has improved blood sugars in the last 24 hours, he appears to be knowledgeable about his diabetes control. He usually takes NovoLog 8-11 units average per meal (according to his sliding scale depending on carbohydrate intake) and Lantus 22 units bedtime. I believe all the surgery and the stress caused hyperglycemia readings. He should be okay to resume his own insulin regimen and adjust  according to his blood sugar readings with his sliding scale. Relayed the above to Dr Michaell Cowing, okay to DC from medicine standpoint.    LOS: 5 days   Tawania Daponte M.D. [facility] 06/07/2011, 1:44 PM Pager: [phone]

## 2011-06-07 NOTE — Progress Notes (Signed)
Greg Robertson [birth year] [id number]  PCP: Rudi Heap, MD, MD Outpatient Care Team: Patient Care Team: Rudi Heap, MD as PCP - General (Family Medicine) Louis Meckel, MD as Consulting Physician (Gastroenterology)  Inpatient Treatment Team: Treatment Team: Attending Provider: Ardeth Sportsman, MD; Registered Nurse: Skipper Cliche, RN; Technician: Michelene Heady, NT; Technician: Mal Misty, NT; Registered Nurse: Holland Commons, RN; Rounding Team: Merlyn Albert, MD; Consulting Physician: Cathren Harsh, MD; Registered Nurse: July Dizon Cephus Richer, RN; Technician: Vella Raring, NT; Respiratory Therapist: Ok Anis, RT  Subjective: Glucose more stable, <200s IM following - help appreciated No events Tol solids.  No N/V Walking well in hallways Minimal discomfort  Objective:  Vital signs:  Temp:  [97.9 F (36.6 C)-98 F (36.7 C)] 98 F (36.7 C) (02/19 0537) Pulse Rate:  [73-92] 73  (02/19 0537) Resp:  [18-20] 18  (02/19 0537) BP: (110-153)/(68-86) 110/68 mmHg (02/19 0537) SpO2:  [99 %-100 %] 99 % (02/19 0537) Last BM Date: 06/06/11  Intake/Output   Yesterday:  02/18 0701 - 02/19 0700 In: 480 [P.O.:480] Out: 2100 [Urine:2100] This shift:     Bowel function:  Flatus: Yes  BM: x2 yest AM.  No mjr blood  Physical Exam:  General: Pt awake/alert/oriented x4 in no acute distress Eyes: PERRL, normal EOM.  Sclera clear.  No icterus Neuro: CN II-XII intact w/o focal sensory/motor deficits. Lymph: No head/neck/groin lymphadenopathy Psych:  No delerium/psychosis/paranoia.  Calm, smiling in bed HENT: Normocephalic, Mucus membranes moist.  No thrush Neck: Supple, No tracheal deviation Chest: Clear.   No chest wall pain w good excursion CV:  Pulses intact.  Regular rhythm Abdomen: Soft, Nontender at incisions.  Incisions c/d/i.  Nondistended.  No hernias. Ext:  SCDs BLE.  No mjr edema.  No cyanosis Skin: No petechiae /  purpurae  Results:   Labs: Results for orders placed during the hospital encounter of 06/02/11 (from the past 48 hour(s))  GLUCOSE, CAPILLARY     Status: Abnormal   Collection Time   06/05/11  7:30 AM      Component Value Range Comment   Glucose-Capillary 114 (*) 70 - 99 (mg/dL)   GLUCOSE, CAPILLARY     Status: Abnormal   Collection Time   06/05/11 11:55 AM      Component Value Range Comment   Glucose-Capillary 335 (*) 70 - 99 (mg/dL)   GLUCOSE, CAPILLARY     Status: Abnormal   Collection Time   06/05/11  4:55 PM      Component Value Range Comment   Glucose-Capillary 38 (*) 70 - 99 (mg/dL)    Comment 1 Notify RN     GLUCOSE, CAPILLARY     Status: Abnormal   Collection Time   06/05/11  5:14 PM      Component Value Range Comment   Glucose-Capillary 40 (*) 70 - 99 (mg/dL)   GLUCOSE, CAPILLARY     Status: Abnormal   Collection Time   06/05/11  5:31 PM      Component Value Range Comment   Glucose-Capillary 47 (*) 70 - 99 (mg/dL)   GLUCOSE, CAPILLARY     Status: Abnormal   Collection Time   06/05/11  5:52 PM      Component Value Range Comment   Glucose-Capillary 118 (*) 70 - 99 (mg/dL)   GLUCOSE, CAPILLARY     Status: Abnormal   Collection Time   06/05/11  9:43 PM      Component Value Range Comment   Glucose-Capillary  404 (*) 70 - 99 (mg/dL)   GLUCOSE, RANDOM     Status: Abnormal   Collection Time   06/05/11 10:40 PM      Component Value Range Comment   Glucose, Bld 439 (*) 70 - 99 (mg/dL)   GLUCOSE, CAPILLARY     Status: Abnormal   Collection Time   06/05/11 11:08 PM      Component Value Range Comment   Glucose-Capillary 418 (*) 70 - 99 (mg/dL)   GLUCOSE, CAPILLARY     Status: Abnormal   Collection Time   06/06/11  1:59 AM      Component Value Range Comment   Glucose-Capillary 308 (*) 70 - 99 (mg/dL)   GLUCOSE, CAPILLARY     Status: Abnormal   Collection Time   06/06/11  7:35 AM      Component Value Range Comment   Glucose-Capillary 216 (*) 70 - 99 (mg/dL)    Comment 1  Notify RN      Comment 2 Documented in Chart     GLUCOSE, CAPILLARY     Status: Abnormal   Collection Time   06/06/11  8:28 AM      Component Value Range Comment   Glucose-Capillary 206 (*) 70 - 99 (mg/dL)   GLUCOSE, CAPILLARY     Status: Abnormal   Collection Time   06/06/11 12:13 PM      Component Value Range Comment   Glucose-Capillary 192 (*) 70 - 99 (mg/dL)    Comment 1 Notify RN      Comment 2 Documented in Chart     GLUCOSE, CAPILLARY     Status: Abnormal   Collection Time   06/06/11  3:57 PM      Component Value Range Comment   Glucose-Capillary 133 (*) 70 - 99 (mg/dL)    Comment 1 Notify RN      Comment 2 Documented in Chart     GLUCOSE, CAPILLARY     Status: Abnormal   Collection Time   06/06/11  9:47 PM      Component Value Range Comment   Glucose-Capillary 146 (*) 70 - 99 (mg/dL)     Imaging / Studies: No results found.  Medications / Allergies: per chart  Antibiotics: Anti-infectives     Start     Dose/Rate Route Frequency Ordered Stop   06/02/11 1130   ertapenem (INVANZ) 1 g in sodium chloride 0.9 % 50 mL IVPB        1 g 100 mL/hr over 30 Minutes Intravenous 60 min pre-op 06/02/11 1118 06/02/11 1500          Assessment  Greg Robertson  61 y.o. male  5 Days Post-Op  Procedure(s): LAPAROSCOPIC PARTIAL COLECTOMY  Problem List:  Principal Problem:  *Cancer of ascending colon, 7cm Active Problems:  Diabetes mellitus without mention of complication  Glaucoma  Recovering better  Plan: -solid diet as tolerated -bowel regimen / anti-ileus protocol -close DM control - IM help appreciated  -holding off on D/C until under better control.  Suspect prob OK to leave later today if OK w IMedicine. -VTE prophylaxis- SCDs, etc -mobilize as tolerated to help recovery -f/u path -RTC ~10days after D/C.  Re-discussed goals @home , reasons to call, etc  Ardeth Sportsman, M.D., F.A.C.S. Gastrointestinal and Minimally Invasive Surgery [facility] [address], [address] [place], [place] [address] [phone] Main / Paging [phone] Voice Mail   06/07/2011

## 2011-06-07 NOTE — Progress Notes (Signed)
Patient provided with discharge instructions and prescription. Patient verbalized understanding. Patient discharged to home. 

## 2011-06-07 NOTE — Discharge Summary (Signed)
Physician Discharge Summary  Patient ID: Greg Robertson MRN: [id number] DOB/AGE: 10/24/1950 61 y.o.  Admit date: 06/02/2011 Discharge date: 06/07/2011  Patient Care Team: Rudi Heap, MD as PCP - General (Family Medicine) Louis Meckel, MD as Consulting Physician (Gastroenterology)  Admission Diagnoses: Principal Problem:  *Cancer of ascending colon, 7cm Active Problems:  Diabetes mellitus without mention of complication  Glaucoma  Discharge Diagnoses:  Principal Problem:  *Cancer of ascending colon, 7cm Active Problems:  Diabetes mellitus without mention of complication  Glaucoma   Discharged Condition: good  Hospital Course: The patient underwent surgery. He was placed on the anti-ileus protocol. He did advance his diet. He was having flatus. He was weaned off IV fluids.  As he advanced his diet we had to adjust his insulin regimen. He had some issues of hypoglycemia and hyperglycemia. We consulted medicine. They helped adjust his sliding scale and other medicines. Gradually we have adjusted and he was able to go back home on his home regimen of NovoLog and Lantus.  By the time of discharge, he was walking well the hallways, eating well, having problems, having flatus, having minimal pain. DM control improved.  Therefore, we felt it was reasonable to discharge home with close followup  Consults: Medicine for DM care  Significant Diagnostic Studies:   Treatments: surgery: Lap partial colectomy of right colon  Discharge Exam: Blood pressure 110/68, pulse 73, temperature 98 F (36.7 C), temperature source Oral, resp. rate 18, height 6\' 3"  (1.905 m), weight 168 lb (76.204 kg), SpO2 99.00%.  General: Pt awake/alert/oriented x4 in no major acute distress Eyes: PERRL, normal EOM. Neuro: CN II-XII intact w/o focal sensory/motor deficits. Lymph: No head/neck/groin lymphadenopathy Psych:  No delerium/psychosis/paranoia HEENT: Normocephalic, Mucus membranes moist.  No  thrush Neck: Supple, No tracheal deviation Chest: No pain w good excursion CV:  Pulses intact.  Regular rhythm Abdomen: soft, nontender/nondistended.  Incisions c/d/i.  No incarcerated hernias. Ext:  SCDs BLE.  No mjr edema.  No cyanosis Skin: No petechiae / purpurae   Disposition: 01-Home or Self Care  Discharge Orders    Future Appointments: Provider: Department: Dept Phone: Center:   06/24/2011 1:30 PM [facility] [phone] None   06/24/2011 1:45 PM Gwenith Spitz Shumate [facility] [phone] None   06/24/2011 2:00 PM Lucile Shutters, MD [facility] [phone] None     Future Orders Please Complete By Expires   Diet - low sodium heart healthy      Increase activity slowly        Medication List  As of 06/07/2011  1:36 PM   TAKE these medications         ACCU-CHEK AVIVA PLUS test strip   Generic drug: glucose blood   Daily.      acetaminophen 500 MG tablet   Commonly known as: TYLENOL   Take 1,000 mg by mouth every 6 (six) hours as needed. Pain      COMBIGAN 0.2-0.5 % ophthalmic solution   Generic drug: brimonidine-timolol   Place 1 drop into both eyes every 12 (twelve) hours.      ECOTRIN PO   Take 81 mg by mouth daily before breakfast.      insulin aspart 100 UNIT/ML injection   Commonly known as: novoLOG   Inject 1-15 Units into the skin 3 (three) times daily before meals. Take 1 unit for ever 6 grams of carbs before each meal      insulin glargine 100 UNIT/ML injection   Commonly  known as: LANTUS   Inject 22 Units into the skin at bedtime.      latanoprost 0.005 % ophthalmic solution   Commonly known as: XALATAN   Place 1 drop into both eyes at bedtime.      levocetirizine 5 MG tablet   Commonly known as: XYZAL   Take 5 mg by mouth daily before breakfast.      LISINOPRIL PO   Take 5 mg by mouth daily before breakfast.      multivitamin tablet   Take 1 tablet by mouth daily.      NasaDock Misc   Place 1  spray into both nostrils daily.      oxyCODONE 5 MG immediate release tablet   Commonly known as: Oxy IR/ROXICODONE   Take 1-2 tablets (5-10 mg total) by mouth every 4 (four) hours as needed for pain.      SIMVASTATIN PO   Take 10 mg by mouth daily before breakfast.      triamcinolone 55 MCG/ACT nasal inhaler   Commonly known as: NASACORT   Place 1 spray into the nose 2 (two) times daily as needed. Allergies        VITAMIN C PO   Take 1 tablet by mouth daily.      VITAMIN D (CHOLECALCIFEROL) PO   Take 2,000 Units by mouth daily.           Follow-up Information    Follow up with Brigitt Mcclish C., MD in 2 weeks.   Contact information:   [facility] [address] [place] [address] [phone]          Signed: Ardeth Sportsman. 06/07/2011, 1:36 PM

## 2011-06-15 ENCOUNTER — Telehealth (INDEPENDENT_AMBULATORY_CARE_PROVIDER_SITE_OTHER): Payer: Self-pay

## 2011-06-15 NOTE — Telephone Encounter (Signed)
Called [facility] to find out appt for pt and who he is seeing. The pt will see DR Truett Perna on 06-24-11 at [facility].

## 2011-06-23 ENCOUNTER — Other Ambulatory Visit (INDEPENDENT_AMBULATORY_CARE_PROVIDER_SITE_OTHER): Payer: Self-pay | Admitting: Surgery

## 2011-06-23 ENCOUNTER — Encounter (INDEPENDENT_AMBULATORY_CARE_PROVIDER_SITE_OTHER): Payer: Self-pay | Admitting: Surgery

## 2011-06-23 DIAGNOSIS — C182 Malignant neoplasm of ascending colon: Secondary | ICD-10-CM

## 2011-06-24 ENCOUNTER — Ambulatory Visit: Payer: BC Managed Care – PPO

## 2011-06-24 ENCOUNTER — Ambulatory Visit (HOSPITAL_BASED_OUTPATIENT_CLINIC_OR_DEPARTMENT_OTHER): Payer: BC Managed Care – PPO | Admitting: Oncology

## 2011-06-24 ENCOUNTER — Other Ambulatory Visit (HOSPITAL_BASED_OUTPATIENT_CLINIC_OR_DEPARTMENT_OTHER): Payer: BC Managed Care – PPO | Admitting: Lab

## 2011-06-24 VITALS — BP 132/72 | HR 74 | Temp 97.4°F | Ht 75.0 in | Wt 164.0 lb

## 2011-06-24 DIAGNOSIS — E119 Type 2 diabetes mellitus without complications: Secondary | ICD-10-CM

## 2011-06-24 DIAGNOSIS — C182 Malignant neoplasm of ascending colon: Secondary | ICD-10-CM

## 2011-06-24 DIAGNOSIS — D509 Iron deficiency anemia, unspecified: Secondary | ICD-10-CM

## 2011-06-24 NOTE — Progress Notes (Signed)
Met with patient to give education regarding colon cancer.  Offered to have a dietician and SW see pt, but he declines at this time.  Resource phone numbers given to patient and encouraged him to call with questions or concerns.  He verbalized understanding.  Will continue to follow pt.

## 2011-06-24 NOTE — Progress Notes (Signed)
Referring MD: Lorelei Pont D Lottes 61 y.o.  [birth year]    Reason for Referral: New diagnosis of colon cancer     HPI: He developed "indigestion "beginning in November of 2012. When this symptom persisted he saw Dr. Christell Constant. He reports a stool sample was positive for blood on 2 occasions. He was referred to Dr. Arlyce Dice and underwent a colonoscopy on 05/16/2011. A mass was found at the cecum. The colonoscopy was otherwise without polyps, masses, or inflammatory changes. A biopsy of the cecum mass was positive for adenocarcinoma.  A CT of the abdomen and pelvis on 05/19/2011 revealed a large mass at the a sending colon measuring 6.4 x 7.9 cm. Adjacent soft tissue stranding was noted in the pericolonic fat. Enlarged lymph nodes were seen in the right pericolonic fat and right abdominal mesentery measuring up to 13 mm. Shotty less than 1 cm retroperitoneal lymph nodes were seen at the aortocaval and left para-aortic spaces. No evidence of bowel obstruction. No ascites. No liver mass.  He was taken to the operating room on 06/02/2011 and underwent a laparoscopic-assisted ileocolectomy. A tumor was noted at the hepatic flexure with inflammatory adhesions to the duodenum and retroperitoneum. Lymphadenopathy was noted in the ileocolic and right middle colic vessels.  The pathology ([id number]) confirmed a poorly differentiated adenocarcinoma invading through the muscularis propria into the pericolonic fat. No evidence of angiolymphatic or perineural invasion was identified. The the resection margins were negative. One of 27 pericolonic lymph nodes was positive for metastatic carcinoma. A Crohn's like reaction was noted at periphery of the tumor. No K-ras mutation was detected. The tumor returned microsatellite unstable.  He reports an omental operative recovery. He has mild "soreness "in the abdomen.  Past Medical History  Diagnosis Date  . Diabetes mellitus  age 34   . Glaucoma   . Allergic  rhinitis   . Prostate nodule   . Colonic mass  January 2013   .    .    . Cancer of ascending colon, 7cm 05/25/2011  . Hypertension   . Pneumonia 1979 or 1980  . Anemia-microcytic   05/30/2011    Past Surgical History  Procedure Date  . Finger surgery 2009    right middle  . Nasal fracture surgery 1968  . Tonsillectomy 1957 - approximate    Family History  Problem Relation Age of Onset  . Colon polyps Father   . Lung cancer Father-smoker   90s   . Diabetes Father   . Breast cancer Mother  85s  . Pancreatitis Mother     intestinal adhesions    Current outpatient prescriptions:ACCU-CHEK AVIVA PLUS test strip, Daily., Disp: , Rfl: ;  acetaminophen (TYLENOL) 500 MG tablet, Take 1,000 mg by mouth every 6 (six) hours as needed. Pain, Disp: , Rfl: ;  Ascorbic Acid (VITAMIN C PO), Take 1 tablet by mouth daily., Disp: , Rfl: ;  Aspirin (ECOTRIN PO), Take 81 mg by mouth daily before breakfast. , Disp: , Rfl:  brimonidine-timolol (COMBIGAN) 0.2-0.5 % ophthalmic solution, Place 1 drop into both eyes every 12 (twelve) hours. , Disp: , Rfl: ;  insulin aspart (NOVOLOG) 100 UNIT/ML injection, Inject 1-15 Units into the skin 3 (three) times daily before meals. Take 1 unit for ever 6 grams of carbs before each meal, Disp: , Rfl: ;  insulin glargine (LANTUS) 100 UNIT/ML injection, Inject 22 Units into the skin at bedtime., Disp: , Rfl:  latanoprost (XALATAN) 0.005 % ophthalmic solution, Place 1 drop  into both eyes at bedtime., Disp: , Rfl: ;  levocetirizine (XYZAL) 5 MG tablet, Take 5 mg by mouth daily before breakfast. , Disp: , Rfl: ;  LISINOPRIL PO, Take 5 mg by mouth daily before breakfast. , Disp: , Rfl: ;  Multiple Vitamin (MULTIVITAMIN) tablet, Take 1 tablet by mouth daily., Disp: , Rfl: ;  SIMVASTATIN PO, Take 10 mg by mouth daily before breakfast. , Disp: , Rfl:  triamcinolone (NASACORT) 55 MCG/ACT nasal inhaler, Place 1 spray into the nose 2 (two) times daily as needed. Allergies , Disp: , Rfl:  ;  VITAMIN D, CHOLECALCIFEROL, PO, Take 2,000 Units by mouth daily. , Disp: , Rfl:   Allergies: No Known Allergies  Social History:   He lives with his father in [place]. He works in [employer]. He quit smoking cigarettes in 1995. He drinks 1 ounce of liquor approximately every 3 days. He denies risk factors for HIV and hepatitis.  History  Alcohol Use  . Yes    1 drink every 2 days    History  Smoking status  . Former Smoker -- 1.0 packs/day for 15 years  . Types: Cigarettes  . Quit date: 04/18/1993  Smokeless tobacco  . Never Used     ROS:   Positives include:  A complete ROS was otherwise negative.  Physical Exam:  Blood pressure 132/72, pulse 74, temperature 97.4 F (36.3 C), temperature source Oral, height 6\' 3"  (1.905 m), weight 164 lb (74.39 kg).  HEENT: Oropharynx without visible mass, neck without mass Lungs: Clear bilaterally Cardiac: Regular rate and rhythm Abdomen: Healed surgical incisions. No hepatomegaly, no mass GU: Testes without mass  Vascular: No leg edema Lymph nodes: No cervical, supraclavicular, axillary, or inguinal nodes Neurologic: Alert and oriented, the motor examination appears grossly intact Skin: No rash   LAB:  CBC  Lab Results  Component Value Date   WBC 5.7 06/03/2011   HGB 9.0* 06/03/2011   HCT 29.1* 06/03/2011   MCV 78.2 06/03/2011   PLT 226 06/03/2011     CMP      Component Value Date/Time   NA 135 06/03/2011 0432   K 4.8 06/03/2011 0432   CL 99 06/03/2011 0432   CO2 26 06/03/2011 0432   GLUCOSE 439* 06/05/2011 2240   BUN 21 06/03/2011 0432   CREATININE 1.10 06/03/2011 0432   CALCIUM 8.7 06/03/2011 0432   GFRNONAA 71* 06/03/2011 0432   GFRAA 82* 06/03/2011 0432   CEA 2.6 on 05/19/2011  Assessment/Plan:   1 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 Microcytic anemia-likely secondary to  iron deficiency  3. Insulin-dependent diabetes  4. Glaucoma   Disposition:   He has been diagnosed with stage III colon cancer. I discussed the diagnosis, prognosis, and adjuvant treatment options in detail with the patient today. We reviewed the surgical pathology report. I explained the disease-free and overall survival benefit associated with adjuvant systemic chemotherapy in patients with stage III colon cancer. We discussed the 5 fluorouracil-based chemotherapy and the expected benefit with the addition of oxaliplatin. He understands the equivalence of the CAPOX and FOLFOX regimens. I discussed the treatment schema and potential toxicities associated with each of these regimens. He prefers FOLFOX chemotherapy and agrees to begin adjuvant therapy with this regimen.  We reviewed the potential toxicities associated with the FOLFOX regimen including the chance for nausea/vomiting, mucositis, diarrhea, and hematologic toxicity.  We also discussed the potential for alopecia. We discussed the hand/foot syndrome associated with 5-fluorouracil and the various types of neuropathy seen with oxaliplatin.  We will refer him to Dr. Michaell Cowing for placement of a Port-A-Cath. He will attend a chemotherapy teaching class. A first cycle of FOLFOX chemotherapy has been scheduled for 07/07/2011.  He will be referred to the genetics counselor for additional testing to rule out a diagnosis of hereditary non-polyposis colon cancer syndrome. His prognosis may be improved if he tests positive for HNPCC.   Greg Robertson Greg Robertson 06/24/2011, 4:31 PM

## 2011-06-27 ENCOUNTER — Other Ambulatory Visit (INDEPENDENT_AMBULATORY_CARE_PROVIDER_SITE_OTHER): Payer: Self-pay | Admitting: Surgery

## 2011-06-27 ENCOUNTER — Telehealth: Payer: Self-pay | Admitting: Oncology

## 2011-06-27 NOTE — Telephone Encounter (Signed)
Not able to reach pt re genetics appt. Line busy - will call back.

## 2011-06-27 NOTE — Telephone Encounter (Signed)
sent a email to Dr. Michaell Cowing nurse to schedule port placement for pt before 03/21.  g copy of genetics referrral to [facility] to scheduled appt

## 2011-06-27 NOTE — Telephone Encounter (Signed)
called pt and informed of lab appt for 03/12

## 2011-06-28 ENCOUNTER — Other Ambulatory Visit: Payer: BC Managed Care – PPO

## 2011-06-28 ENCOUNTER — Other Ambulatory Visit (HOSPITAL_BASED_OUTPATIENT_CLINIC_OR_DEPARTMENT_OTHER): Payer: BC Managed Care – PPO | Admitting: Lab

## 2011-06-28 ENCOUNTER — Encounter: Payer: Self-pay | Admitting: *Deleted

## 2011-06-28 DIAGNOSIS — C182 Malignant neoplasm of ascending colon: Secondary | ICD-10-CM

## 2011-06-28 LAB — CBC WITH DIFFERENTIAL/PLATELET
BASO%: 0.9 % (ref 0.0–2.0)
EOS%: 4.5 % (ref 0.0–7.0)
MCH: 25.7 pg — ABNORMAL LOW (ref 27.2–33.4)
MCHC: 32.1 g/dL (ref 32.0–36.0)
MONO#: 0.5 10*3/uL (ref 0.1–0.9)
NEUT%: 51.3 % (ref 39.0–75.0)
RBC: 4.1 10*6/uL — ABNORMAL LOW (ref 4.20–5.82)
WBC: 4.4 10*3/uL (ref 4.0–10.3)
lymph#: 1.4 10*3/uL (ref 0.9–3.3)

## 2011-06-28 LAB — COMPREHENSIVE METABOLIC PANEL
ALT: 27 U/L (ref 0–53)
AST: 28 U/L (ref 0–37)
CO2: 28 mEq/L (ref 19–32)
Calcium: 9.6 mg/dL (ref 8.4–10.5)
Chloride: 105 mEq/L (ref 96–112)
Creatinine, Ser: 0.9 mg/dL (ref 0.50–1.35)
Sodium: 143 mEq/L (ref 135–145)
Total Bilirubin: 0.3 mg/dL (ref 0.3–1.2)
Total Protein: 6.4 g/dL (ref 6.0–8.3)

## 2011-06-29 ENCOUNTER — Encounter (HOSPITAL_COMMUNITY): Payer: Self-pay | Admitting: *Deleted

## 2011-06-29 ENCOUNTER — Other Ambulatory Visit: Payer: Self-pay | Admitting: *Deleted

## 2011-06-29 ENCOUNTER — Encounter (HOSPITAL_COMMUNITY): Payer: Self-pay | Admitting: Pharmacy Technician

## 2011-06-29 MED ORDER — PROCHLORPERAZINE MALEATE 10 MG PO TABS: 10.0000 mg | ORAL_TABLET | Freq: Four times a day (QID) | ORAL | Status: DC | PRN

## 2011-06-29 MED ORDER — LIDOCAINE-PRILOCAINE 2.5-2.5 % EX CREA: TOPICAL_CREAM | CUTANEOUS | Status: DC | PRN

## 2011-06-29 MED ORDER — FERROUS SULFATE 325 (65 FE) MG PO TABS: 325.0000 mg | ORAL_TABLET | Freq: Two times a day (BID) | ORAL | Status: DC

## 2011-06-29 NOTE — Telephone Encounter (Signed)
Called pt, instructed him to begin ferrous sulfate 325mg  twice daily. Informed him that Compazine and EMLA cream prescriptions have been sent to his pharm. Medication teaching complete. Pt verbalized understanding.

## 2011-06-29 NOTE — Pre-Procedure Instructions (Signed)
PT INSTRUCTED NOT TO TAKE ANY DIABETIC MEDS AM OF HIS SURGERY AND TO ONLY TAKE 1/2 USUAL INSULIN THE NIGHT BEFORE HIS SURGERY.  PT STOPPED HIS ASPIRIN -LAST TAKEN 3/12.  PT WILL DO BETASEPT SOAP SHOWER NIGHT BEFORE SURGERY AND AM OF SURGERY--KNOWS NOT TO USE ON HIS HEAD, FACE, PRIVATE AREAS. PT UNDERSTANDS THAT HE IS NPO FOR FOOD AFTER MIDNIGHT--CLEAR LIQUIDS ONLY FROM [place] UNTIL 8:00 AM--WATER, DIET SODA, APPLE JUICE, COFFEE--JUST NO MILK OR MILK PRODUCTS IN COFFEE.

## 2011-06-30 ENCOUNTER — Ambulatory Visit (HOSPITAL_COMMUNITY)
Admission: RE | Admit: 2011-06-30 | Discharge: 2011-06-30 | Disposition: A | Discharge status: Home / Self Care | Payer: BC Managed Care – PPO | Source: Ambulatory Visit | Attending: Oncology | Admitting: Oncology

## 2011-06-30 DIAGNOSIS — R911 Solitary pulmonary nodule: Secondary | ICD-10-CM | POA: Insufficient documentation

## 2011-06-30 DIAGNOSIS — C182 Malignant neoplasm of ascending colon: Secondary | ICD-10-CM | POA: Insufficient documentation

## 2011-06-30 DIAGNOSIS — I700 Atherosclerosis of aorta: Secondary | ICD-10-CM | POA: Insufficient documentation

## 2011-06-30 IMAGING — CT CT CHEST W/O CM
2 of 4 series · 15 of 36 positions shown, 18 images · non-contrast
Comparison: Chest x-ray [DATE].

CLINICAL DATA: History of colon cancer.  Staging scanned.

CT CHEST WITHOUT CONTRAST
TECHNIQUE: Multidetector CT imaging of the chest was performed
following the standard protocol without IV contrast.

[Series 2: chest w/o st · axial · non-contrast · 0.74mm/px · z∈[-42,+288]mm · 12 of 78 slices shown, 15 images]
[im 6/78  mediastinal]
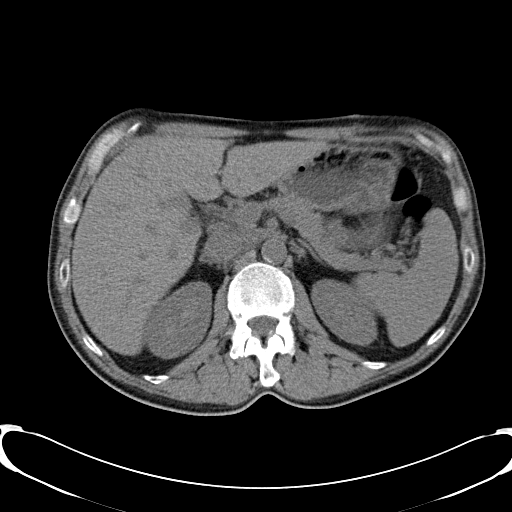
[im 6/78  lung]
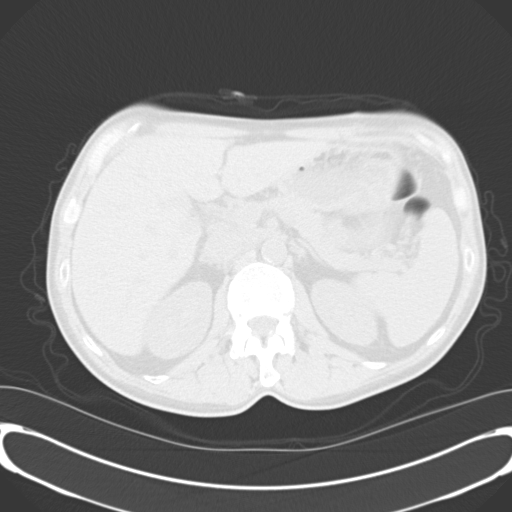
[im 12/78  lung]
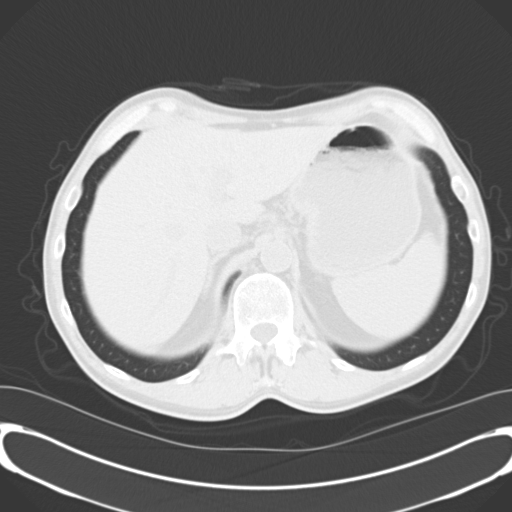
[im 18/78  lung]
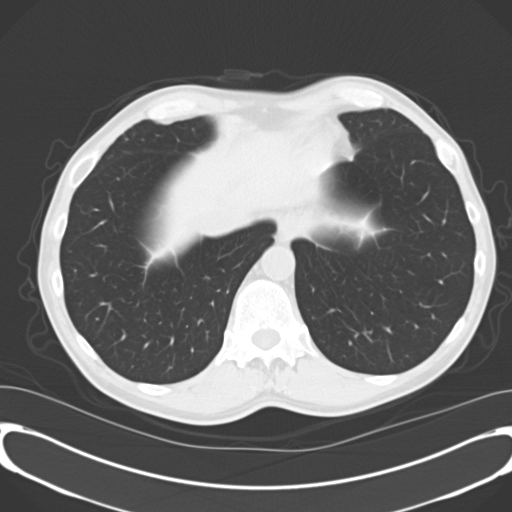
[im 24/78  lung]
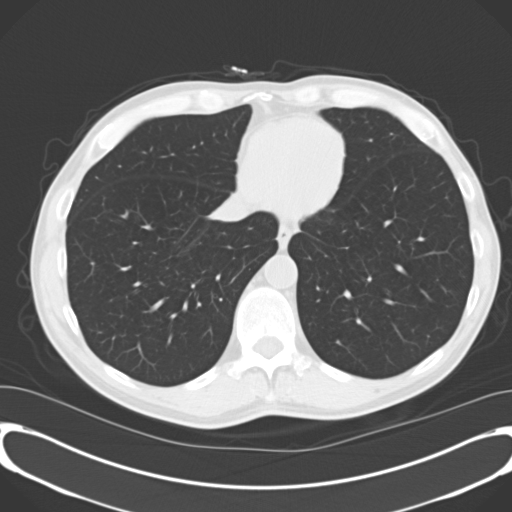
[im 30/78  mediastinal]
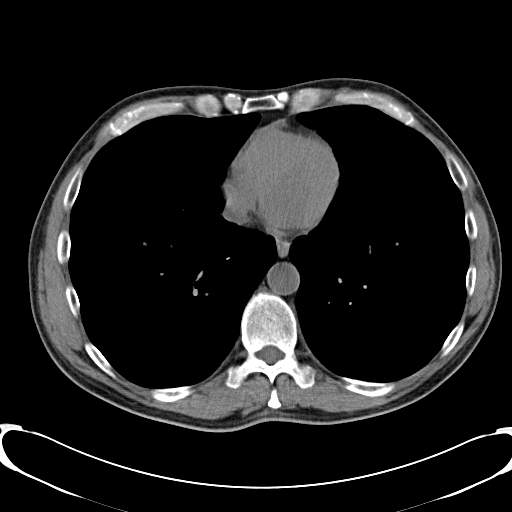
[im 30/78  lung]
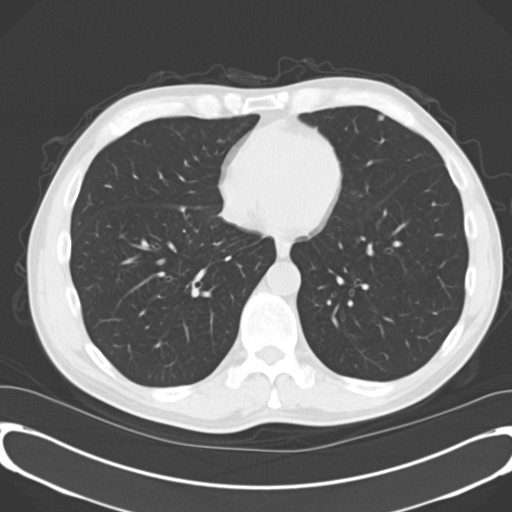
[im 36/78  lung]
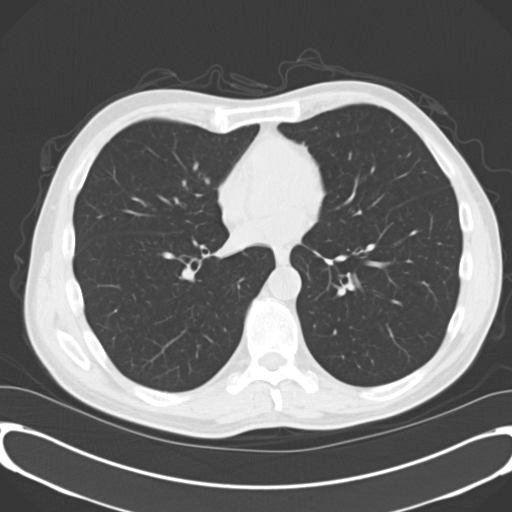
[im 42/78  lung]
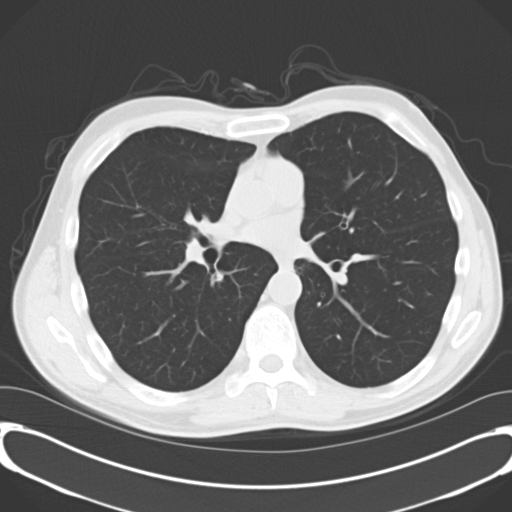
[im 48/78  lung]
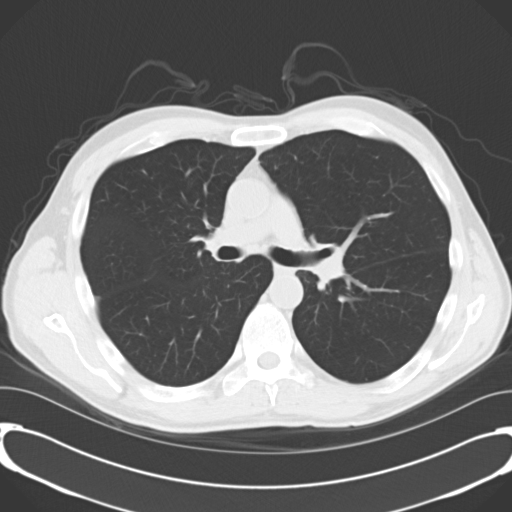
[im 54/78  mediastinal]
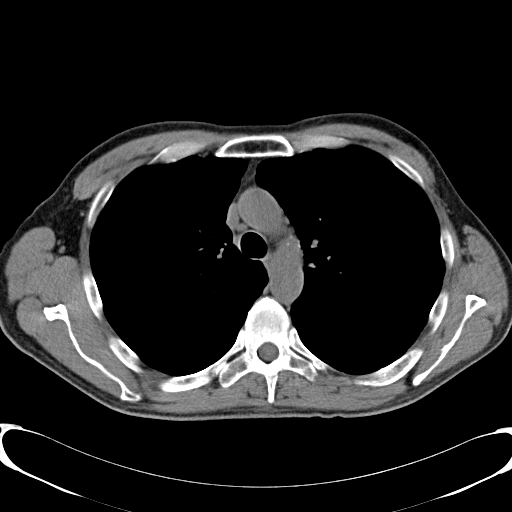
[im 54/78  lung]
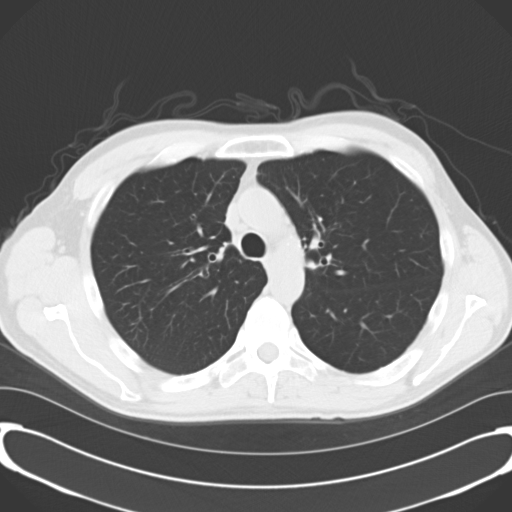
[im 60/78  lung]
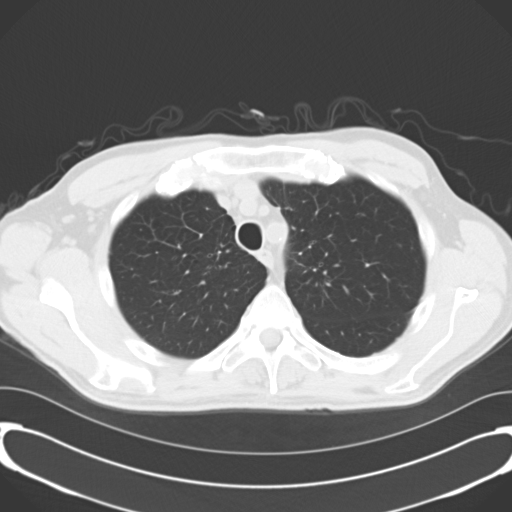
[im 66/78  lung]
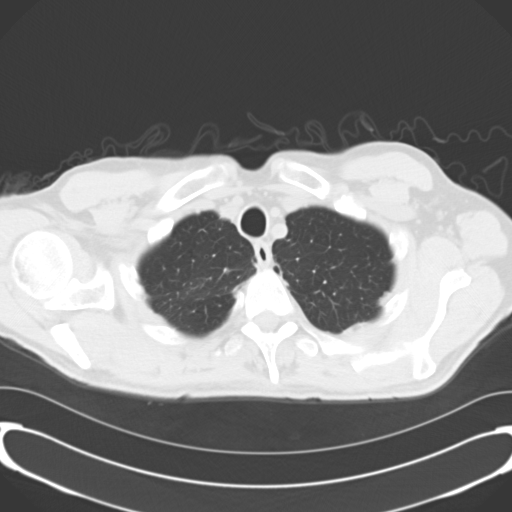
[im 72/78  lung]
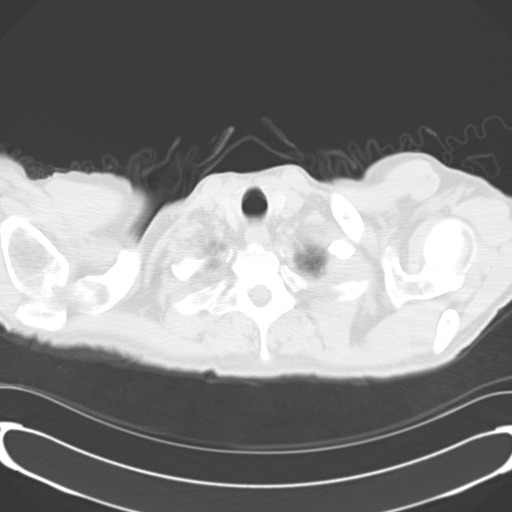

[Series 602: <mpr thick range> · coronal · 0.75mm/px · 3 of 83 slices shown]
[im 17/83  lung]
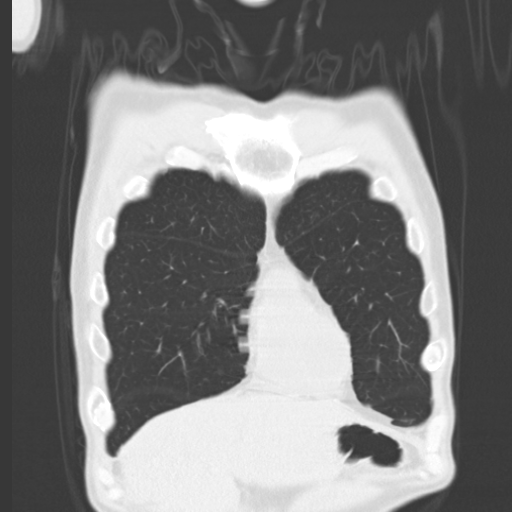
[im 33/83  lung]
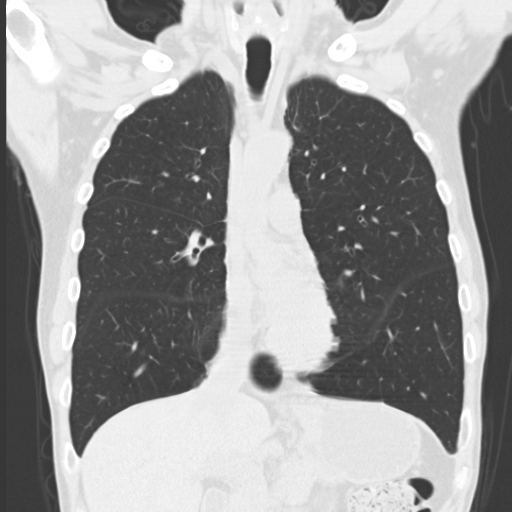
[im 50/83  lung]
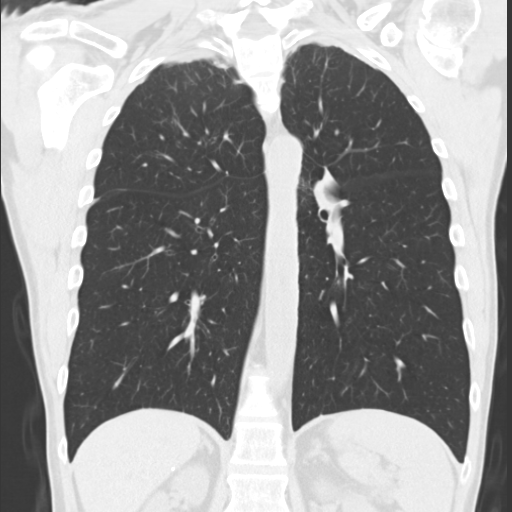

[15 of 36 positions shown; findings below may reference images not displayed]

FINDINGS: Mediastinum: Heart size is normal. There is no significant
pericardial fluid, thickening or pericardial calcification. No
pathologically enlarged mediastinal or hilar lymph nodes. Please
note that accurate exclusion of hilar adenopathy is limited on
noncontrast CT scans.  Esophagus is unremarkable in appearance.
Mild atherosclerosis of the thoracic aorta.

Lungs/Pleura: 5 mm subpleural nodule in the anterior aspect of the
lingula (image 49 of series 5).  No other larger more suspicious
appearing pulmonary nodules or masses are otherwise identified.  No
consolidative airspace disease.  No pleural effusions.  Mild
bilateral apical pleuroparenchymal thickening compatible with
scarring.

Upper Abdomen: Unremarkable.

Musculoskeletal: There are no aggressive appearing lytic or blastic
lesions noted in the visualized portions of the skeleton.
IMPRESSION: 1.  No findings to strongly suggest metastatic disease to the
thorax.
2.  However, there is a 5 mm subpleural nodule in the anterior
aspect of the lingula which is nonspecific.  Statistically, this is
likely to represent a subpleural lymph node, however, in this
patient with documented history of primary malignancy, continued
attention to this lesion on follow-up studies is recommended to
ensure stability..
3.  Mild atherosclerosis.

## 2011-07-01 ENCOUNTER — Ambulatory Visit (HOSPITAL_COMMUNITY)
Admission: RE | Admit: 2011-07-01 | Discharge: 2011-07-01 | Disposition: A | Discharge status: Home / Self Care | Payer: BC Managed Care – PPO | Source: Ambulatory Visit | Attending: Surgery | Admitting: Surgery

## 2011-07-01 ENCOUNTER — Ambulatory Visit (HOSPITAL_COMMUNITY): Payer: BC Managed Care – PPO

## 2011-07-01 ENCOUNTER — Encounter (HOSPITAL_COMMUNITY)
Admission: RE | Disposition: A | Discharge status: Home / Self Care | Payer: Self-pay | Source: Ambulatory Visit | Attending: Surgery

## 2011-07-01 ENCOUNTER — Ambulatory Visit (HOSPITAL_COMMUNITY): Payer: BC Managed Care – PPO | Admitting: Anesthesiology

## 2011-07-01 ENCOUNTER — Encounter (HOSPITAL_COMMUNITY): Payer: Self-pay | Admitting: Anesthesiology

## 2011-07-01 DIAGNOSIS — C182 Malignant neoplasm of ascending colon: Secondary | ICD-10-CM | POA: Insufficient documentation

## 2011-07-01 DIAGNOSIS — E119 Type 2 diabetes mellitus without complications: Secondary | ICD-10-CM | POA: Insufficient documentation

## 2011-07-01 DIAGNOSIS — H409 Unspecified glaucoma: Secondary | ICD-10-CM | POA: Insufficient documentation

## 2011-07-01 DIAGNOSIS — C189 Malignant neoplasm of colon, unspecified: Secondary | ICD-10-CM

## 2011-07-01 HISTORY — PX: PORTACATH PLACEMENT: SHX2246

## 2011-07-01 LAB — GLUCOSE, CAPILLARY

## 2011-07-01 LAB — SURGICAL PCR SCREEN: MRSA, PCR: NEGATIVE

## 2011-07-01 IMAGING — CR DG CHEST 1V PORT
1 series · 2 of 2 positions shown · non-contrast
Comparison: CT [DATE]

CLINICAL DATA: Port-A-Cath placement

PORTABLE CHEST - 1 VIEW

[Series 1: AP · U · 2 of 2 slices shown]
[im 1/2]
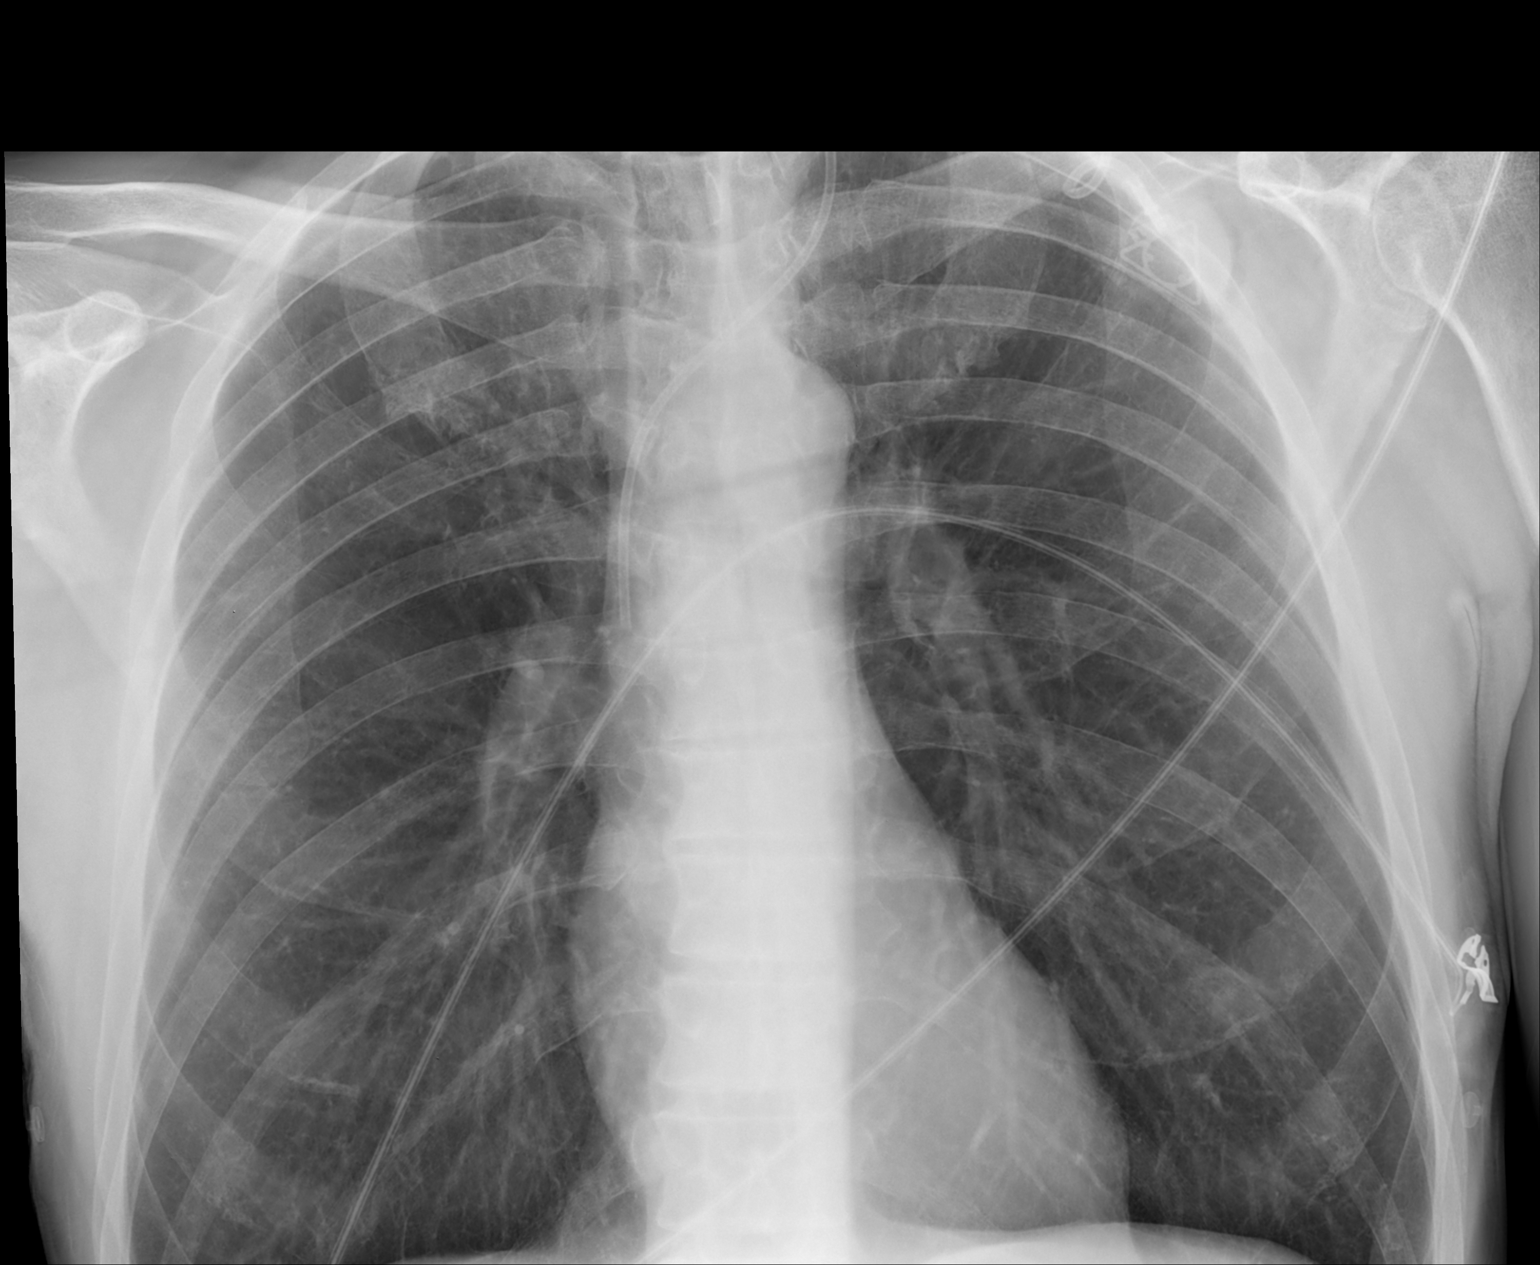
[im 2/2]
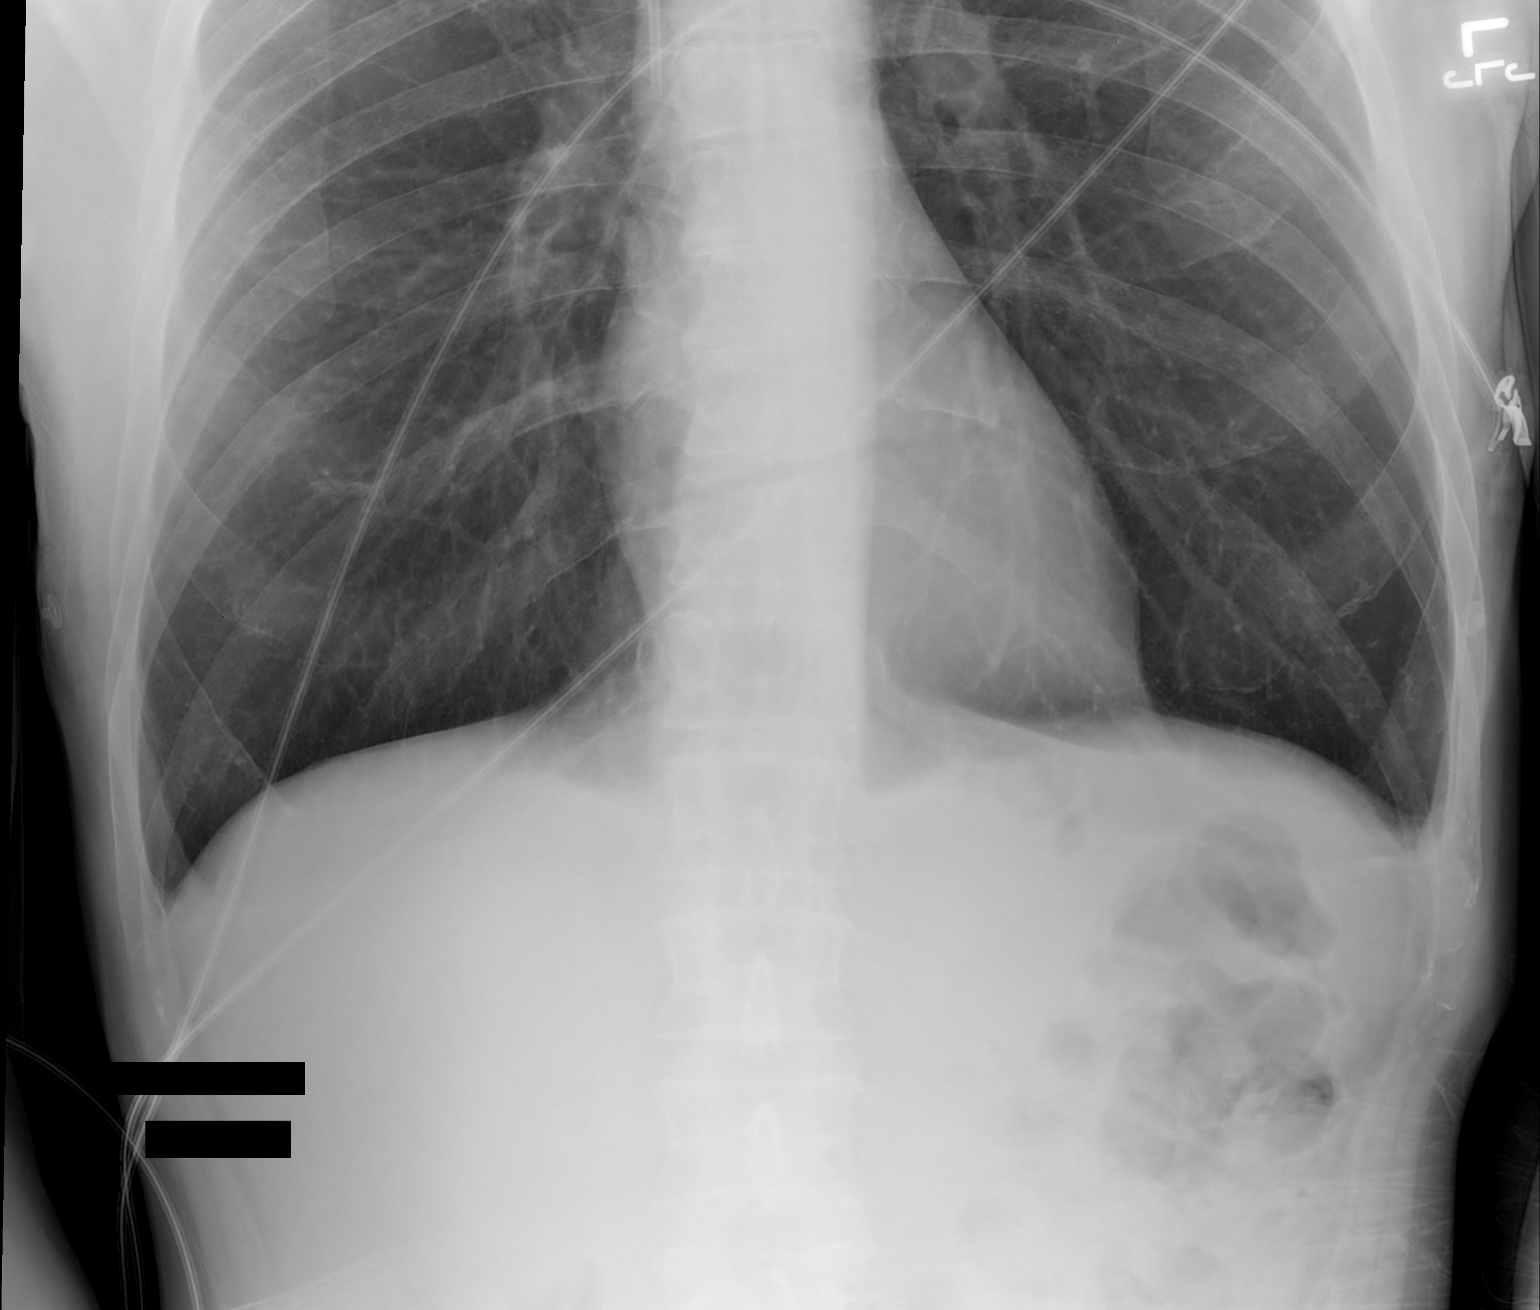

[2 of 2 positions shown; findings below may reference images not displayed]

FINDINGS: There is a power port on the left from a subclavian
approach.  The tip is in the distal superior vena cava.  The
catheter tubing does looped once approximately 2 cm from the hub.
No pneumothorax.
IMPRESSION: 1.  Interval placement of left port with no pneumothorax.
2.  Loop in the catheter approximate 2 cm from the hub.

## 2011-07-01 SURGERY — INSERTION, TUNNELED CENTRAL VENOUS DEVICE, WITH PORT
Anesthesia: Monitor Anesthesia Care | Laterality: Left

## 2011-07-01 MED ORDER — INSULIN ASPART 100 UNIT/ML ~~LOC~~ SOLN
SUBCUTANEOUS | Status: AC
  Administered 2011-07-01: 5 [IU]
  Filled 2011-07-01: qty 3

## 2011-07-01 MED ORDER — FENTANYL CITRATE 0.05 MG/ML IJ SOLN: 25.0000 ug | INTRAMUSCULAR | Status: DC | PRN

## 2011-07-01 MED ORDER — MUPIROCIN 2 % EX OINT
TOPICAL_OINTMENT | CUTANEOUS | Status: AC
  Filled 2011-07-01: qty 22

## 2011-07-01 MED ORDER — SODIUM CHLORIDE 0.9 % IR SOLN
Status: DC
  Filled 2011-07-01: qty 1.2

## 2011-07-01 MED ORDER — MIDAZOLAM HCL 5 MG/5ML IJ SOLN
INTRAMUSCULAR | Status: DC | PRN
  Administered 2011-07-01: 2 mg via INTRAVENOUS

## 2011-07-01 MED ORDER — HEPARIN SOD (PORK) LOCK FLUSH 100 UNIT/ML IV SOLN
INTRAVENOUS | Status: DC | PRN
  Administered 2011-07-01: 500 [IU]

## 2011-07-01 MED ORDER — HEPARIN SOD (PORK) LOCK FLUSH 100 UNIT/ML IV SOLN
INTRAVENOUS | Status: AC
  Filled 2011-07-01: qty 5

## 2011-07-01 MED ORDER — LACTATED RINGERS IV SOLN
INTRAVENOUS | Status: DC | PRN
  Administered 2011-07-01: 13:00:00 via INTRAVENOUS

## 2011-07-01 MED ORDER — CHLORHEXIDINE GLUCONATE 4 % EX LIQD: 1.0000 "application " | Freq: Once | CUTANEOUS | Status: DC

## 2011-07-01 MED ORDER — LIDOCAINE HCL 1 % IJ SOLN
INTRAMUSCULAR | Status: AC
  Filled 2011-07-01: qty 20

## 2011-07-01 MED ORDER — LIDOCAINE HCL (CARDIAC) 20 MG/ML IV SOLN
INTRAVENOUS | Status: DC | PRN
  Administered 2011-07-01: 100 mg via INTRAVENOUS

## 2011-07-01 MED ORDER — FENTANYL CITRATE 0.05 MG/ML IJ SOLN
INTRAMUSCULAR | Status: DC | PRN
  Administered 2011-07-01: 100 ug via INTRAVENOUS

## 2011-07-01 MED ORDER — SODIUM CHLORIDE 0.9 % IR SOLN
Status: DC | PRN
  Administered 2011-07-01: 1000 mL

## 2011-07-01 MED ORDER — PROPOFOL 10 MG/ML IV EMUL
INTRAVENOUS | Status: DC | PRN
  Administered 2011-07-01: 50 ug/kg/min via INTRAVENOUS

## 2011-07-01 MED ORDER — BUPIVACAINE-EPINEPHRINE 0.25% -1:200000 IJ SOLN
INTRAMUSCULAR | Status: DC | PRN
  Administered 2011-07-01: 50 mL

## 2011-07-01 MED ORDER — INSULIN ASPART 100 UNIT/ML ~~LOC~~ SOLN: 0.0000 [IU] | SUBCUTANEOUS | Status: DC

## 2011-07-01 MED ORDER — HYDROCODONE-ACETAMINOPHEN 5-325 MG PO TABS: 1.0000 | ORAL_TABLET | Freq: Four times a day (QID) | ORAL | Status: AC | PRN

## 2011-07-01 MED ORDER — SODIUM CHLORIDE 0.9 % IR SOLN
Status: DC | PRN
  Administered 2011-07-01: 14:00:00

## 2011-07-01 MED ORDER — BUPIVACAINE-EPINEPHRINE 0.25% -1:200000 IJ SOLN
INTRAMUSCULAR | Status: AC
  Filled 2011-07-01: qty 1

## 2011-07-01 MED ORDER — LIDOCAINE HCL (PF) 1 % IJ SOLN
INTRAMUSCULAR | Status: DC | PRN
  Administered 2011-07-01: 10 mL

## 2011-07-01 MED ORDER — LACTATED RINGERS IV SOLN: INTRAVENOUS | Status: DC

## 2011-07-01 MED ORDER — CEFAZOLIN SODIUM 1-5 GM-% IV SOLN
INTRAVENOUS | Status: AC
  Filled 2011-07-01: qty 100

## 2011-07-01 MED ORDER — CEFAZOLIN SODIUM-DEXTROSE 2-3 GM-% IV SOLR
2.0000 g | INTRAVENOUS | Status: AC
  Administered 2011-07-01: 2 g via INTRAVENOUS

## 2011-07-01 SURGICAL SUPPLY — 36 items
BAG DECANTER FOR FLEXI CONT (MISCELLANEOUS) ×2 IMPLANT
BLADE HEX COATED 2.75 (ELECTRODE) IMPLANT
BLADE SURG SZ11 CARB STEEL (BLADE) ×2 IMPLANT
CHLORAPREP W/TINT 26ML (MISCELLANEOUS) ×2 IMPLANT
CLOTH BEACON ORANGE TIMEOUT ST (SAFETY) ×2 IMPLANT
COVER PROBE W GEL 5X96 (DRAPES) ×2 IMPLANT
COVER SURGICAL LIGHT HANDLE (MISCELLANEOUS) ×4 IMPLANT
DECANTER SPIKE VIAL GLASS SM (MISCELLANEOUS) ×2 IMPLANT
DRAPE C-ARM 42X72 X-RAY (DRAPES) ×2 IMPLANT
DRAPE PED LAPAROTOMY (DRAPES) ×2 IMPLANT
DRSG TEGADERM 2-3/8X2-3/4 SM (GAUZE/BANDAGES/DRESSINGS) IMPLANT
DRSG TEGADERM 4X4.75 (GAUZE/BANDAGES/DRESSINGS) ×2 IMPLANT
ELECT REM PT RETURN 9FT ADLT (ELECTROSURGICAL) ×2
ELECTRODE REM PT RTRN 9FT ADLT (ELECTROSURGICAL) ×1 IMPLANT
GAUZE SPONGE 2X2 8PLY STRL LF (GAUZE/BANDAGES/DRESSINGS) ×1 IMPLANT
GAUZE SPONGE 4X4 16PLY XRAY LF (GAUZE/BANDAGES/DRESSINGS) ×2 IMPLANT
GLOVE BIOGEL PI IND STRL 7.0 (GLOVE) ×1 IMPLANT
GLOVE BIOGEL PI INDICATOR 7.0 (GLOVE) ×1
GLOVE ECLIPSE 8.0 STRL XLNG CF (GLOVE) ×4 IMPLANT
GLOVE INDICATOR 8.0 STRL GRN (GLOVE) ×2 IMPLANT
GOWN STRL NON-REIN LRG LVL3 (GOWN DISPOSABLE) ×2 IMPLANT
GOWN STRL REIN XL XLG (GOWN DISPOSABLE) ×4 IMPLANT
KIT BASIN OR (CUSTOM PROCEDURE TRAY) ×2 IMPLANT
KIT POWER CATH 8FR (Catheter) ×2 IMPLANT
NEEDLE HYPO 25X1 1.5 SAFETY (NEEDLE) ×2 IMPLANT
NS IRRIG 1000ML POUR BTL (IV SOLUTION) ×2 IMPLANT
PACK BASIC VI WITH GOWN DISP (CUSTOM PROCEDURE TRAY) ×2 IMPLANT
PENCIL BUTTON HOLSTER BLD 10FT (ELECTRODE) IMPLANT
Power Port ×2 IMPLANT
SPONGE GAUZE 2X2 STER 10/PKG (GAUZE/BANDAGES/DRESSINGS) ×1
SUT MON AB 4-0 SH 27 (SUTURE) ×2 IMPLANT
SUT PROLENE 2 0 SH DA (SUTURE) ×4 IMPLANT
SYR BULB IRRIGATION 50ML (SYRINGE) IMPLANT
SYR CONTROL 10ML LL (SYRINGE) ×2 IMPLANT
SYRINGE 10CC LL (SYRINGE) ×2 IMPLANT
TOWEL OR 17X26 10 PK STRL BLUE (TOWEL DISPOSABLE) ×2 IMPLANT

## 2011-07-01 NOTE — Anesthesia Postprocedure Evaluation (Signed)
  Anesthesia Post-op Note  Patient: Greg Robertson  Procedure(s) Performed: Procedure(s) (LRB): INSERTION PORT-A-CATH (Left)  Patient Location: PACU  Anesthesia Type: MAC  Level of Consciousness: awake and alert   Airway and Oxygen Therapy: Patient Spontanous Breathing  Post-op Pain: mild  Post-op Assessment: Post-op Vital signs reviewed, Patient's Cardiovascular Status Stable, Respiratory Function Stable, Patent Airway and No signs of Nausea or vomiting  Post-op Vital Signs: stable  Complications: No apparent anesthesia complications

## 2011-07-01 NOTE — Op Note (Signed)
07/01/2011  2:21 PM  PATIENT:  Greg Robertson  61 y.o. male  Patient Care Team: Ernestina Penna, MD as PCP - General (Family Medicine) Louis Meckel, MD as Consulting Physician (Gastroenterology)  PRE-OPERATIVE DIAGNOSIS:  Colon cancer, Need for chemotherapy  POST-OPERATIVE DIAGNOSIS: Colon cancer, Need for chemotherapy  PROCEDURE:  Procedure(s): INSERTION PORT-A-CATH  SURGEON:  Surgeon(s): Ardeth Sportsman, MD  ASSISTANTS: none   ANESTHESIA:   local and IV sedation  EBL:  <60mL    DRAINS: none   SPECIMEN:  No Specimen  DISPOSITION OF SPECIMEN:  N/A  COUNTS:  YES  PLAN OF CARE: Discharge to home after PACU  PATIENT DISPOSITION:  PACU - hemodynamically stable.  Delay start of Pharmacological VTE agent (>24hrs) due to surgical blood loss or risk of bleeding:  No  Indications: Patient is a middle-aged male with node-positive colon cancer. He underwent partial colectomy. It was felt he would benefit from chemotherapy. Port-A-Cath placement was requested.  Use of a central venous catheter for intravenous therapy was discussed.  Technique of catheter placement using ultrasound and fluoroscopy guidance was discussed.  Risks such as bleeding, infection, pneumothorax, catheter occlusion, reoperation, and other risks were discussed.   I noted a good likelihood this will help address the problem.  Questions were answered.  The patient expressed understanding & wishes to proceed.  Findings: Normal-appearing anatomy.  Is an 8 [place] power port. It goes through the left internal jugular vein  Procedure: Informed consent was confirmed. Patient was brought the operating room. He was positioned supine. He'll arms were tucked. He underwent deep sedation. His neck and chest were clipped and prepped and draped in a sterile fashion. A surgical timeout confirmed or plan.  I used the ultrasound she did not identify the left internal jugular vein. I entered into it using on the first  venipuncture under direct ultrasound guidance. Wire was passed into the inferior vena cava. This was confirmed by fluoroscopy.  I made an incision in the lateral and thought infraclavicular pocket. Made a saphenous pocket. I tunneled the power port from the chest wound of the neck puncture site. Procedure the paregoric to the left anterior chest wall using 2-0 Prolene interrupted stitches x4. Catheter flushed well.  I used a dilator on the wire using Seldinger technique to dilate the neck tract. I used a dilator and sheath.  We used fluoroscopy to pull wire back into the right atrial/superior vena cava junction. This was noted.  I pulled the back until it was at the neck puncture site. This was the true measurement of the intravenous segment. I cut the catheter appropriate length. I removed the wire and dilator. The catheter was placed within the sheath. The sheath was peeled away.  Fluoroscopy confirmed the tip in the right atrium. I pulled the catheter a few cm out from the subcutaneous chest wound and pulled it back. It seemed to be more in the superior vena cava. Catheter aspirated and flushed well. On final fluoro reevaluation the tip seen to be more proximal. I therefore I readvanced the catheter through the neck wound another 2 cm until was more into the superior vena cava. Catheter flushed and aspirated fine.  Tip remained in distal SVC on fluoroscopy.  I closed the wounds using 4 Monocryl stitch. We placed sterile dressings. Patient should go home later today. Catheter is okay to use.

## 2011-07-01 NOTE — Anesthesia Preprocedure Evaluation (Addendum)
Anesthesia Evaluation  Patient identified by MRN, date of birth, ID band Patient awake    Reviewed: Allergy & Precautions, H&P , NPO status , Patient's Chart, lab work & pertinent test results  Airway Mallampati: II TM Distance: >3 FB Neck ROM: full    Dental  (+) Poor Dentition, Missing and Dental Advisory Given Many missing teeth lower front.  Left upper front is decayed.:   Pulmonary neg pulmonary ROS, pneumonia ,  breath sounds clear to auscultation  Pulmonary exam normal       Cardiovascular Exercise Tolerance: Good hypertension, Pt. on medications negative cardio ROS  Rhythm:regular Rate:Normal     Neuro/Psych negative neurological ROS  negative psych ROS   GI/Hepatic negative GI ROS, Neg liver ROS,   Endo/Other  negative endocrine ROSDiabetes mellitus-, Well Controlled, Type 2, Insulin Dependent  Renal/GU negative Renal ROS  negative genitourinary   Musculoskeletal   Abdominal   Peds  Hematology negative hematology ROS (+)   Anesthesia Other Findings   Reproductive/Obstetrics negative OB ROS                          Anesthesia Physical Anesthesia Plan  ASA: II  Anesthesia Plan: MAC   Post-op Pain Management:    Induction:   Airway Management Planned:   Additional Equipment:   Intra-op Plan:   Post-operative Plan:   Informed Consent: I have reviewed the patients History and Physical, chart, labs and discussed the procedure including the risks, benefits and alternatives for the proposed anesthesia with the patient or authorized representative who has indicated his/her understanding and acceptance.   Dental Advisory Given  Plan Discussed with: CRNA and Surgeon  Anesthesia Plan Comments:         Anesthesia Quick Evaluation

## 2011-07-01 NOTE — Discharge Instructions (Signed)
GENERAL SURGERY: POST OP INSTRUCTIONS  1. DIET: Follow a light bland diet the first 24 hours after arrival home, such as soup, liquids, crackers, etc.  Be sure to include lots of fluids daily.  Avoid fast food or heavy meals as your are more likely to get nauseated.   2. Take your usually prescribed home medications unless otherwise directed. 3. PAIN CONTROL: a. Pain is best controlled by a usual combination of three different methods TOGETHER: i. Ice/Heat ii. Over the counter pain medication iii. Prescription pain medication b. Most patients will experience some swelling and bruising around the incisions.  Ice packs or heating pads (30-60 minutes up to 6 times a day) will help. Use ice for the first few days to help decrease swelling and bruising, then switch to heat to help relax tight/sore spots and speed recovery.  Some people prefer to use ice alone, heat alone, alternating between ice & heat.  Experiment to what works for you.  Swelling and bruising can take several weeks to resolve.   c. It is helpful to take an over-the-counter pain medication regularly for the first few weeks.  Choose one of the following that works best for you: i. Acetaminophen (Tylenol, etc) 500-650mg  four times a day (every meal & bedtime) d. A  prescription for pain medication (such as oxycodone, hydrocodone, etc) should be given to you upon discharge.  Take your pain medication as prescribed.  i. If you are having problems/concerns with the prescription medicine (does not control pain, nausea, vomiting, rash, itching, etc), please call us [phone] to see if we need to switch you to a different pain medicine that will work better for you and/or control your side effect better. ii. If you need a refill on your pain medication, please contact your pharmacy.  They will contact our office to request authorization. Prescriptions will not be filled after 5 pm or on week-ends. 4. Avoid getting constipated.  Between the  surgery and the pain medications, it is common to experience some constipation.  Increasing fluid intake and taking a fiber supplement (such as Metamucil, Citrucel, FiberCon, MiraLax, etc) 1-2 times a day regularly will usually help prevent this problem from occurring.  A mild laxative (prune juice, Milk of Magnesia, MiraLax, etc) should be taken according to package directions if there are no bowel movements after 48 hours.   5. Wash / shower every day.  You may shower over the dressings as they are waterproof.  Continue to shower over incision(s) after the dressing is off. 6. Remove your waterproof bandages 5 days after surgery.  You may leave the incision open to air.  You may have skin tapes (Steri Strips) covering the incision(s).  Leave them on until one week, then remove.  You may replace a dressing/Band-Aid to cover the incision for comfort if you wish.      7. ACTIVITIES as tolerated:   a. You may resume regular (light) daily activities beginning the next day--such as daily self-care, walking, climbing stairs--gradually increasing activities as tolerated.  If you can walk 30 minutes without difficulty, it is safe to try more intense activity such as jogging, treadmill, bicycling, low-impact aerobics, swimming, etc. b. Save the most intensive and strenuous activity for last such as sit-ups, heavy lifting, contact sports, etc  Refrain from any heavy lifting or straining until you are off narcotics for pain control.   c. DO NOT PUSH THROUGH PAIN.  Let pain be your guide: If it hurts to do something,  don't do it.  Pain is your body warning you to avoid that activity for another week until the pain goes down. d. You may drive when you are no longer taking prescription pain medication, you can comfortably wear a seatbelt, and you can safely maneuver your car and apply brakes. e. Greg Robertson may have sexual intercourse when it is comfortable.  8. FOLLOW UP in our office Please call [facility] at [phone] as  needed.  The chemotherapy nurses will help check & use the Port-a-Cath   9. IF YOU HAVE DISABILITY OR FAMILY LEAVE FORMS, BRING THEM TO THE OFFICE FOR PROCESSING.  DO NOT GIVE THEM TO YOUR DOCTOR.   WHEN TO CALL us [phone]: 1. Poor pain control 2. Reactions / problems with new medications (rash/itching, nausea, etc)  3. Fever over 101.5 F (38.5 C) 4. Worsening swelling or bruising 5. Continued bleeding from incision. 6. Increased pain, redness, or drainage from the incision   The clinic staff is available to answer your questions during regular business hours (8:30am-5pm).  Please don't hesitate to call and ask to speak to one of our nurses for clinical concerns.   If you have a medical emergency, go to the nearest emergency room or call 911.  A surgeon from [facility] is always on call at the [facility], [place] [address], [address], [place], [place]  [address] ? MAIN: [phone] ? TOLL FREE: [phone] ?  FAX [phone] [email or web address]

## 2011-07-01 NOTE — H&P (View-Only) (Signed)
Greg Robertson July 12, 1950 [id number]  PCP: Rudi Heap, MD, MD Outpatient Care Team: Patient Care Team: Rudi Heap, MD as PCP - General (Family Medicine) Louis Meckel, MD as Consulting Physician (Gastroenterology)  Inpatient Treatment Team: Treatment Team: Attending Provider: Ardeth Sportsman, MD; Registered Nurse: Romeo Rabon, RN; Registered Nurse: Skipper Cliche, RN; Technician: Michelene Heady, NT; Technician: Mal Misty, NT; Registered Nurse: Holland Commons, RN; Rounding Team: Merlyn Albert, MD; Consulting Physician: Cathren Harsh, MD; Respiratory Therapist: Ok Anis, RT  Subjective: Glucose more variable - big shifts w glucose vs insulin IM consulted - help appreciated Feels fine Tol solids.  No N/V Walking well in hallways  Objective:  Vital signs:  Temp:  [98.2 F (36.8 C)-98.8 F (37.1 C)] 98.2 F (36.8 C) (02/18 0539) Pulse Rate:  [67-100] 67  (02/18 0539) Resp:  [18] 18  (02/18 0539) BP: (127-149)/(69-83) 127/69 mmHg (02/18 0539) SpO2:  [97 %-100 %] 97 % (02/18 0539) Last BM Date: 06/04/11  Intake/Output   Yesterday:  02/17 0701 - 02/18 0700 In: 520 [P.O.:480; I.V.:40] Out: 1500 [Urine:1500] This shift:     Bowel function:  Flatus: Yes, "a little"  BM: N  Physical Exam:  General: Pt awake/alert/oriented x4 in no acute distress Eyes: PERRL, normal EOM.  Sclera clear.  No icterus Neuro: CN II-XII intact w/o focal sensory/motor deficits. Lymph: No head/neck/groin lymphadenopathy Psych:  No delerium/psychosis/paranoia.  Calm, smiling in bed HENT: Normocephalic, Mucus membranes moist.  No thrush Neck: Supple, No tracheal deviation Chest: Clear.   No chest wall pain w good excursion CV:  Pulses intact.  Regular rhythm Abdomen: Soft, Mildly tender at incisions only.  Incisions c/d/i/.  Nondistended.  No incarcerated hernias. Ext:  SCDs BLE.  No mjr edema.  No cyanosis Skin: No petechiae /  purpurae  Results:   Labs: Results for orders placed during the hospital encounter of 06/02/11 (from the past 48 hour(s))  GLUCOSE, CAPILLARY     Status: Normal   Collection Time   06/04/11  7:41 AM      Component Value Range Comment   Glucose-Capillary 96  70 - 99 (mg/dL)   GLUCOSE, CAPILLARY     Status: Abnormal   Collection Time   06/04/11 12:01 PM      Component Value Range Comment   Glucose-Capillary 201 (*) 70 - 99 (mg/dL)   GLUCOSE, CAPILLARY     Status: Abnormal   Collection Time   06/04/11  3:38 PM      Component Value Range Comment   Glucose-Capillary 332 (*) 70 - 99 (mg/dL)   GLUCOSE, CAPILLARY     Status: Abnormal   Collection Time   06/04/11 10:04 PM      Component Value Range Comment   Glucose-Capillary 236 (*) 70 - 99 (mg/dL)   GLUCOSE, CAPILLARY     Status: Abnormal   Collection Time   06/05/11  7:30 AM      Component Value Range Comment   Glucose-Capillary 114 (*) 70 - 99 (mg/dL)   GLUCOSE, CAPILLARY     Status: Abnormal   Collection Time   06/05/11 11:55 AM      Component Value Range Comment   Glucose-Capillary 335 (*) 70 - 99 (mg/dL)   GLUCOSE, CAPILLARY     Status: Abnormal   Collection Time   06/05/11  4:55 PM      Component Value Range Comment   Glucose-Capillary 38 (*) 70 - 99 (mg/dL)    Comment 1  Notify RN     GLUCOSE, CAPILLARY     Status: Abnormal   Collection Time   06/05/11  5:14 PM      Component Value Range Comment   Glucose-Capillary 40 (*) 70 - 99 (mg/dL)   GLUCOSE, CAPILLARY     Status: Abnormal   Collection Time   06/05/11  5:31 PM      Component Value Range Comment   Glucose-Capillary 47 (*) 70 - 99 (mg/dL)   GLUCOSE, CAPILLARY     Status: Abnormal   Collection Time   06/05/11  5:52 PM      Component Value Range Comment   Glucose-Capillary 118 (*) 70 - 99 (mg/dL)   GLUCOSE, CAPILLARY     Status: Abnormal   Collection Time   06/05/11  9:43 PM      Component Value Range Comment   Glucose-Capillary 404 (*) 70 - 99 (mg/dL)   GLUCOSE,  RANDOM     Status: Abnormal   Collection Time   06/05/11 10:40 PM      Component Value Range Comment   Glucose, Bld 439 (*) 70 - 99 (mg/dL)   GLUCOSE, CAPILLARY     Status: Abnormal   Collection Time   06/05/11 11:08 PM      Component Value Range Comment   Glucose-Capillary 418 (*) 70 - 99 (mg/dL)   GLUCOSE, CAPILLARY     Status: Abnormal   Collection Time   06/06/11  1:59 AM      Component Value Range Comment   Glucose-Capillary 308 (*) 70 - 99 (mg/dL)     Imaging / Studies: No results found.  Medications / Allergies: per chart  Antibiotics: Anti-infectives     Start     Dose/Rate Route Frequency Ordered Stop   06/02/11 1130   ertapenem (INVANZ) 1 g in sodium chloride 0.9 % 50 mL IVPB        1 g 100 mL/hr over 30 Minutes Intravenous 60 min pre-op 06/02/11 1118 06/02/11 1500          Assessment  Abdias D Walmsley  61 y.o. male  4 Days Post-Op  Procedure(s): LAPAROSCOPIC PARTIAL COLECTOMY  Problem List:  Principal Problem:  *Cancer of ascending colon, 7cm Active Problems:  Diabetes mellitus without mention of complication  Glaucoma  Recovering okay but glc control brittle  Plan: -solid diet as tolerated -bowel regimen / anti-ileus protocol -close DM control - IM help appreciated -VTE prophylaxis- SCDs, etc  -hold off on D/C until under better control -mobilize as tolerated to help recovery -f/u path  Ardeth Sportsman, M.D., F.A.C.S. Gastrointestinal and Minimally Invasive Surgery [facility] [address], [address] [place], [place] [address] [phone] Main / Paging [phone] Voice Mail   06/06/2011

## 2011-07-01 NOTE — Transfer of Care (Signed)
Immediate Anesthesia Transfer of Care Note  Patient: Greg Robertson  Procedure(s) Performed: Procedure(s) (LRB): INSERTION PORT-A-CATH (Left)  Patient Location: PACU  Anesthesia Type: MAC  Level of Consciousness: awake, alert  and oriented  Airway & Oxygen Therapy: Patient Spontanous Breathing and Patient connected to face mask oxygen  Post-op Assessment: Report given to PACU RN and Post -op Vital signs reviewed and stable  Post vital signs: Reviewed and stable  Complications: No apparent anesthesia complications

## 2011-07-01 NOTE — Interval H&P Note (Signed)
History and Physical Interval Note:  07/01/2011 12:24 PM  Greg Robertson  has presented today for surgery, with the diagnosis of Need for chemotherapy  The various methods of treatment have been discussed with the patient and family. After consideration of risks, benefits and other options for treatment, the patient has consented to  Procedure(s) (LRB): INSERTION PORT-A-CATH (N/A) as a surgical intervention .    Use of a central venous catheter for intravenous therapy was discussed.  Technique of catheter placement using ultrasound and fluoroscopy guidance was discussed.  Risks such as bleeding, infection, pneumothorax, catheter occlusion, reoperation, and other risks were discussed.   I noted a good likelihood this will help address the problem.  Questions were answered.  The patient expressed understanding & wishes to proceed.   The patients' history has been reviewed, patient examined, no change in status, stable for surgery.  I have reviewed the patients' chart and labs.  Questions were answered to the patient's satisfaction.     Genevieve Arbaugh C.

## 2011-07-04 ENCOUNTER — Encounter (INDEPENDENT_AMBULATORY_CARE_PROVIDER_SITE_OTHER): Payer: BC Managed Care – PPO | Admitting: Surgery

## 2011-07-05 ENCOUNTER — Telehealth: Payer: Self-pay | Admitting: Oncology

## 2011-07-05 NOTE — Telephone Encounter (Signed)
S/w pt re genetics appt for 3/25 @ 9 am.

## 2011-07-06 ENCOUNTER — Other Ambulatory Visit: Payer: Self-pay | Admitting: Oncology

## 2011-07-07 ENCOUNTER — Ambulatory Visit (HOSPITAL_BASED_OUTPATIENT_CLINIC_OR_DEPARTMENT_OTHER): Payer: BC Managed Care – PPO | Admitting: Oncology

## 2011-07-07 ENCOUNTER — Telehealth: Payer: Self-pay | Admitting: Oncology

## 2011-07-07 ENCOUNTER — Telehealth: Payer: Self-pay | Admitting: *Deleted

## 2011-07-07 ENCOUNTER — Telehealth (INDEPENDENT_AMBULATORY_CARE_PROVIDER_SITE_OTHER): Payer: Self-pay | Admitting: General Surgery

## 2011-07-07 ENCOUNTER — Ambulatory Visit (HOSPITAL_COMMUNITY)
Admission: RE | Admit: 2011-07-07 | Discharge: 2011-07-07 | Disposition: A | Discharge status: Home / Self Care | Payer: BC Managed Care – PPO | Source: Ambulatory Visit | Attending: Oncology | Admitting: Oncology

## 2011-07-07 ENCOUNTER — Other Ambulatory Visit (INDEPENDENT_AMBULATORY_CARE_PROVIDER_SITE_OTHER): Payer: Self-pay | Admitting: Surgery

## 2011-07-07 ENCOUNTER — Ambulatory Visit: Payer: BC Managed Care – PPO

## 2011-07-07 VITALS — BP 131/74 | HR 76 | Temp 97.5°F | Ht 75.0 in | Wt 167.2 lb

## 2011-07-07 DIAGNOSIS — Z452 Encounter for adjustment and management of vascular access device: Secondary | ICD-10-CM | POA: Insufficient documentation

## 2011-07-07 DIAGNOSIS — C182 Malignant neoplasm of ascending colon: Secondary | ICD-10-CM

## 2011-07-07 DIAGNOSIS — Z5111 Encounter for antineoplastic chemotherapy: Secondary | ICD-10-CM

## 2011-07-07 IMAGING — RF IR CV CATH INJECTION
6 series · 15 of 16 positions shown · non-contrast
Comparison: Chest radiograph dated [DATE]

CLINICAL DATA: Recent chest port placement, no blood return,
resistance to flush

CV CATH INJECTION

[Series 1: run · 1 of 1 slices shown (1 of 6)]
[im 1/1]
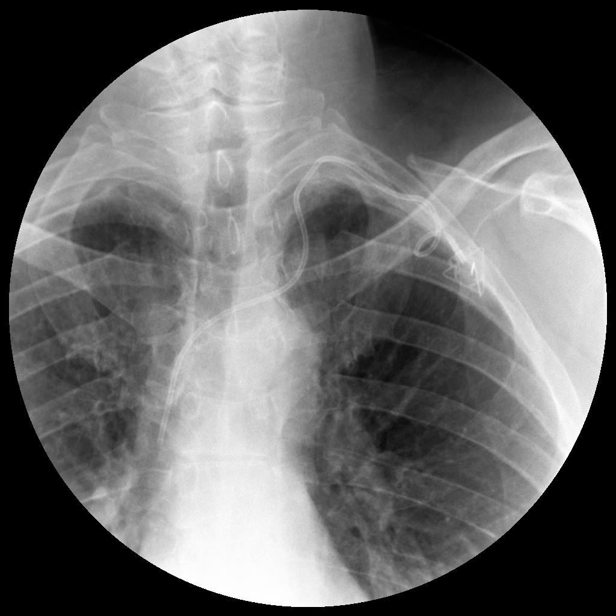

[Series 2: run · 1 of 1 slices shown (2 of 6)]
[im 1/1]
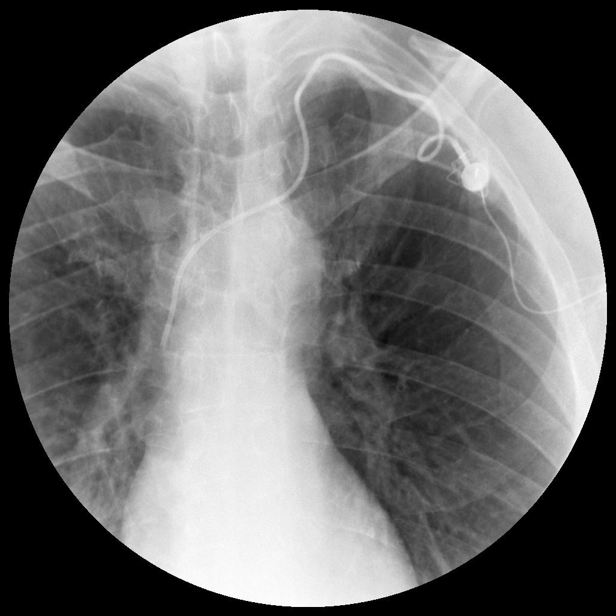

[Series 3: run · 1 of 1 slices shown (3 of 6)]
[im 1/1]
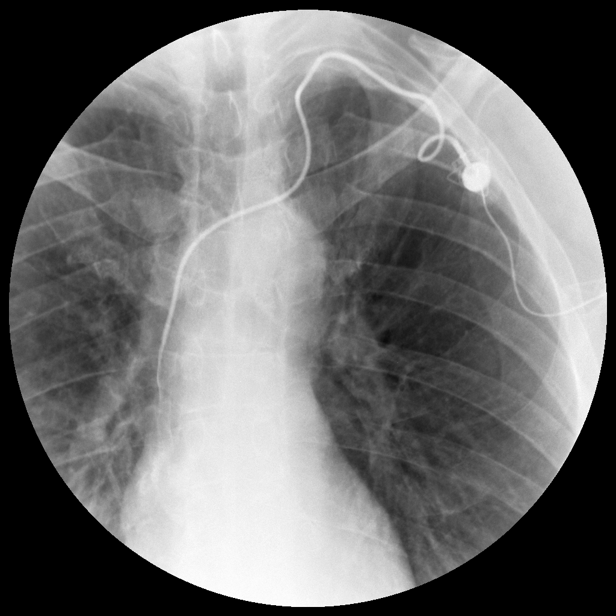

[Series 4: run · 1 of 1 slices shown (4 of 6)]
[im 1/1]
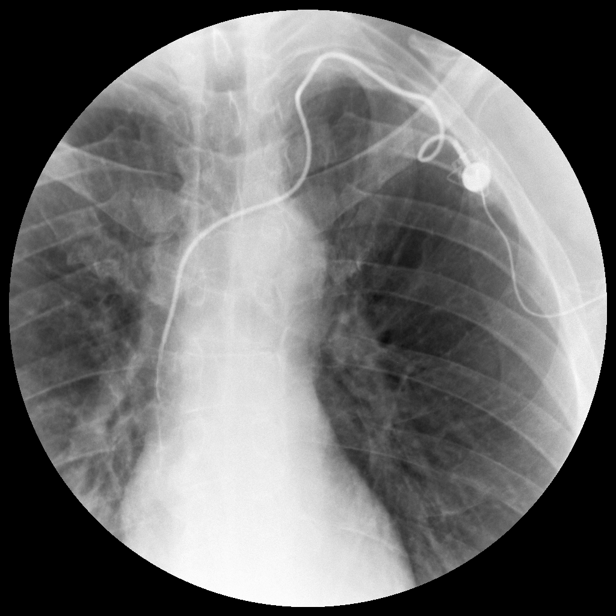

[Series 5: run · 1 of 1 slices shown (5 of 6)]
[im 1/1]
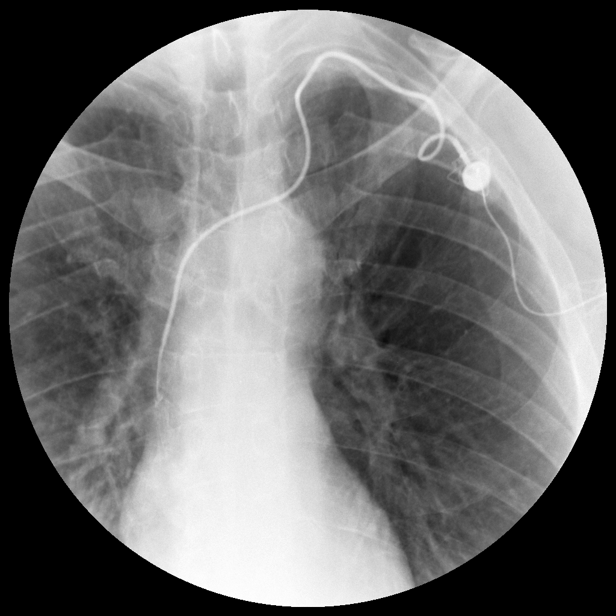

[Series 6: run · 10 of 11 slices shown (6 of 6)]
[im 1/11]
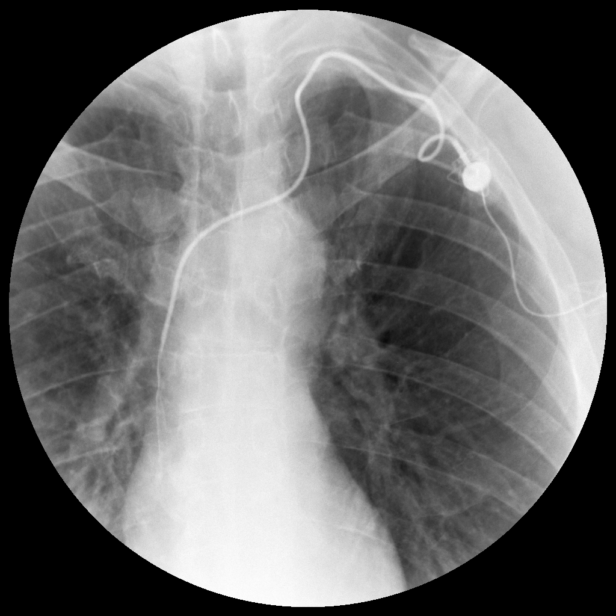
[im 2/11]
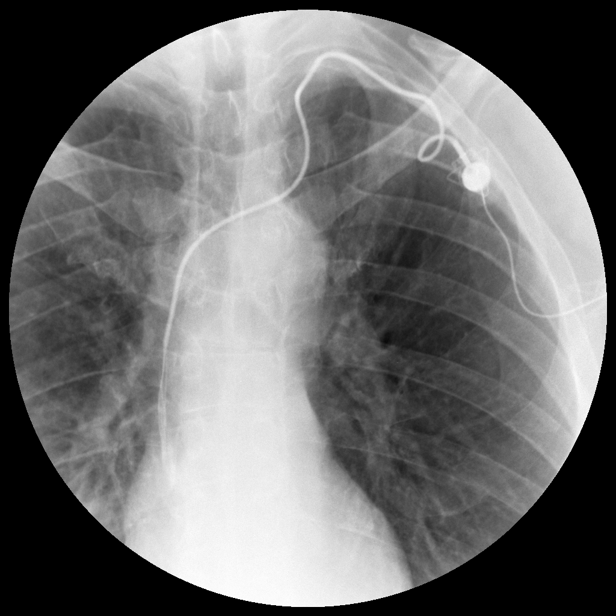
[im 4/11]
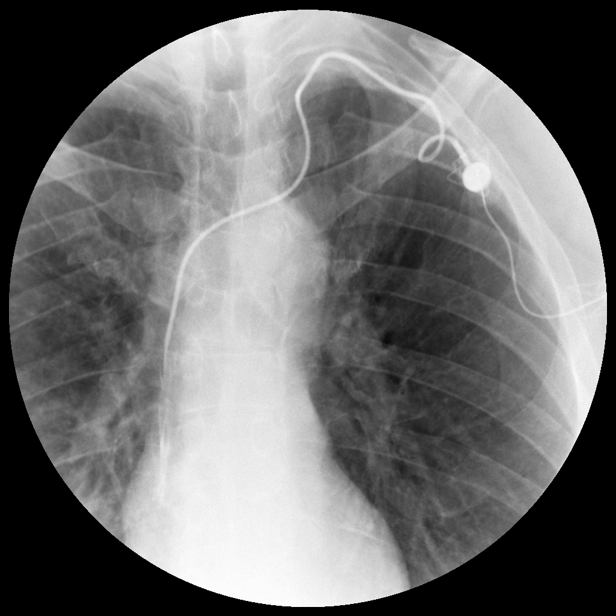
[im 5/11]
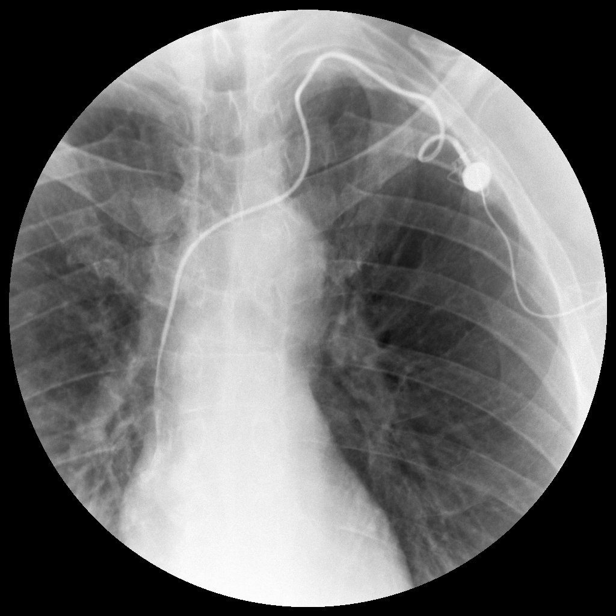
[im 6/11]
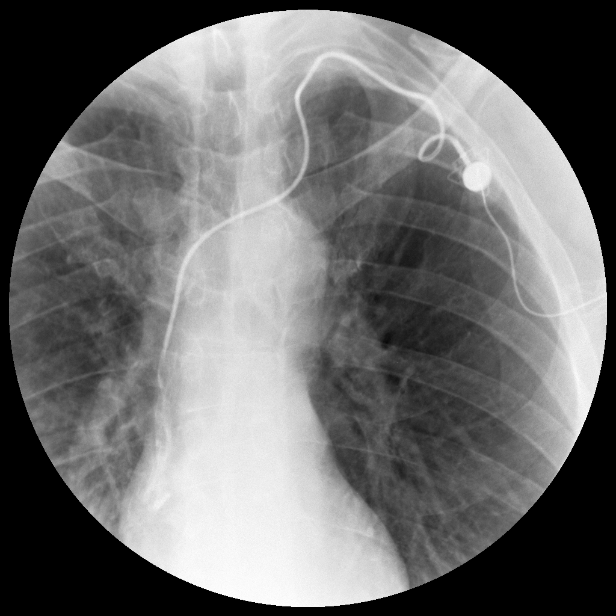
[im 7/11]
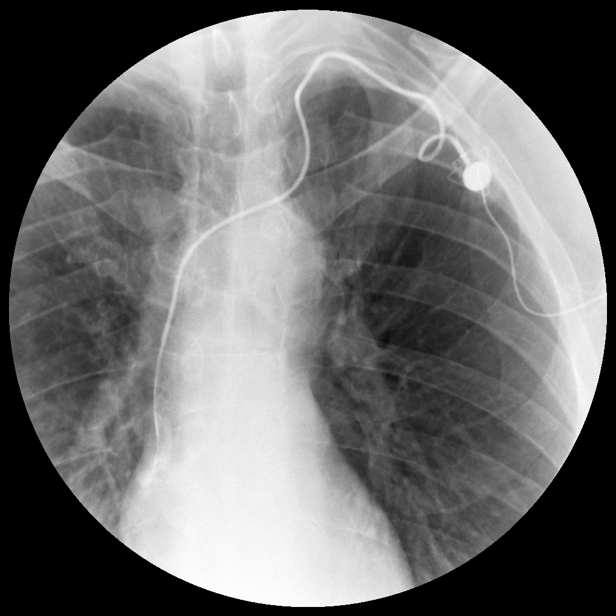
[im 8/11]
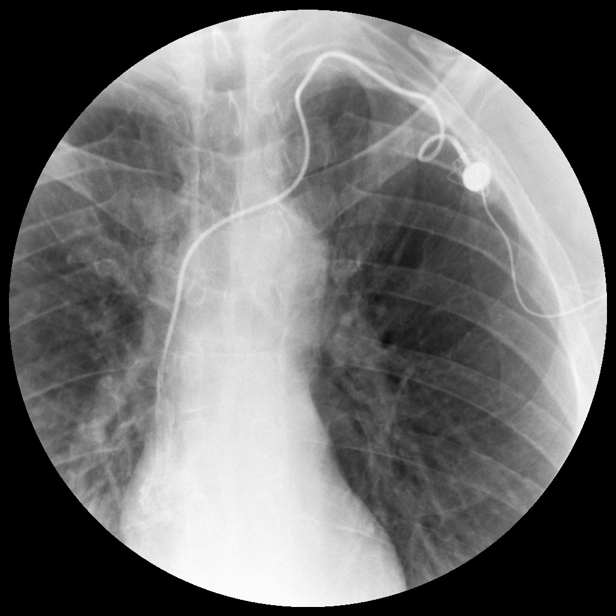
[im 9/11]
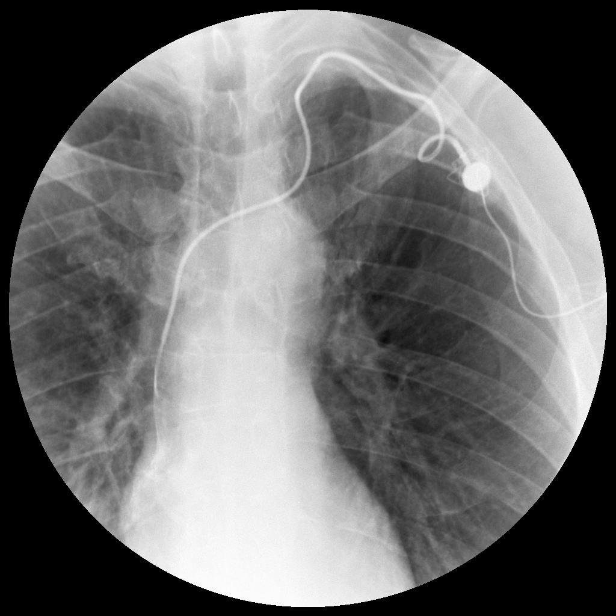
[im 10/11]
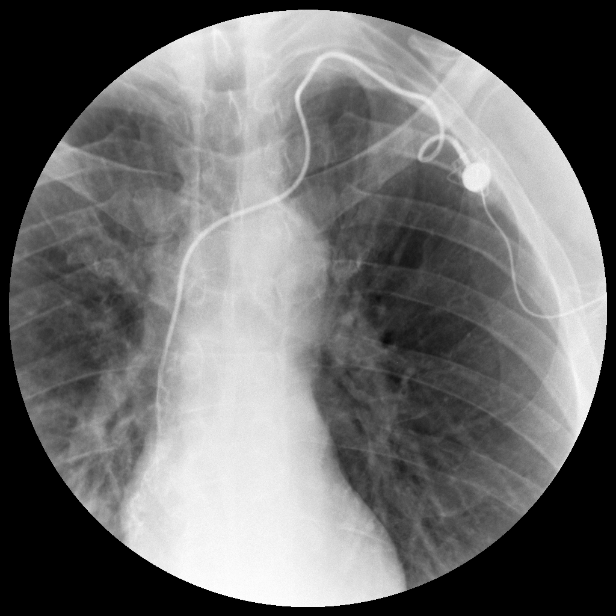
[im 11/11]
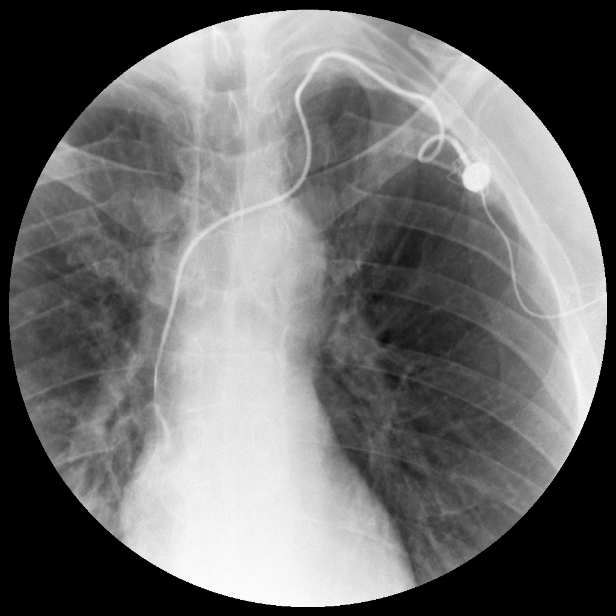

[15 of 16 positions shown; findings below may reference images not displayed]

FINDINGS: Again noted is a loop of the catheter within 2 cm of the
chest port hub, with associated kinking/narrowing and associated
increased resistance.

Despite increased resistance, the catheter remains patent.  No
fibrin sheath is present.

Distal catheter tip is located in the lower SVC.
IMPRESSION: Left chest port which is looped/kinked within 2 cm of the chest
port, with associated increased resistance, although patent.

Revision should be considered.

## 2011-07-07 NOTE — Telephone Encounter (Signed)
Pt in to see Dr. Truett Perna today for treatment.  Unable to obtain blood return or flush port.  It is looped and needs to be revised as soon as possible.  Please schedule and advise pt.

## 2011-07-07 NOTE — Telephone Encounter (Signed)
appts made and printed for pt aom °

## 2011-07-07 NOTE — Telephone Encounter (Signed)
Erskine Squibb called reporting patient's Left chest port is looped/kinked 2 cm of the chest port with associated increased resistance.  Revision should be considered.  Will notify providers.

## 2011-07-07 NOTE — Progress Notes (Signed)
Met with pt prior to first chemo treatment. He had a few questions regarding side effects which were answered.  This RN confirmed that he still has educational information on the chemo drugs and side effects.  This RN offered to have a dietician see him for education and he again declined.  His weight is stable and he reports having a good appetite.  Encouraged pt to call for needs and assistance.  Will continue to follow.

## 2011-07-07 NOTE — Telephone Encounter (Signed)
Orders sent to scheduling. They will post for Monday 07/11/11 @ 7:30 am. Bump office 30 minutes per Dr Jamey Ripa. Amy to schedule.

## 2011-07-07 NOTE — Progress Notes (Signed)
OFFICE PROGRESS NOTE   INTERVAL HISTORY:   He returns as scheduled. He has no complaint. He he underwent placement of a Port-A-Cath on 07/01/2011. The tumor returned with high microsatellite instability and loss of expression of PMS2.   Objective:  Vital signs in last 24 hours:  Blood pressure 131/74, pulse 76, temperature 97.5 F (36.4 C), temperature source Oral, height 6\' 3"  (1.905 m), weight 167 lb 3.2 oz (75.841 kg).   Resp: Lungs clear bilaterally Cardio: Regular rate and rhythm GI: No hepatomegaly, healed incisions Vascular: No leg edema    Portacath/PICC-without erythem  X-rays: A CT of the chest on 06/30/2011 revealed a 5 mm subpleural nodule in the anterior aspect of the lingula. No other suspicious for very nodules or masses were identified.   Medications: I have reviewed the patient's current medications.  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 Microcytic anemia-likely secondary to iron deficiency  3. Insulin-dependent diabetes  4. Glaucoma 5. 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 Status post Port-A-Cath placement 07/01/2011    Disposition:  He has attended chemotherapy teaching class. He is scheduled to begin a first cycle of adjuvant FOLFOX chemotherapy today.  He appears to have hereditary non-polyposis colon cancer syndrome. This makes his prognosis is better. I discussed the controversy surrounding the use of adjuvant 5-fluorouracil-based therapy in these patients. However given the positive lymph node I do recommend adjuvant chemotherapy. He has been referred to the genetics counselor.  Greg Robertson will return for an office visit and cycle 2 of chemotherapy in 2 weeks.   Lucile Shutters, MD  07/07/2011  6:23 PM

## 2011-07-07 NOTE — Telephone Encounter (Signed)
Spoke with triage nurse, Britta Mccreedy and requested she make Dr. Michaell Cowing know that patient needs [facility] revision ASAP. Unable to use his PAC due to coil/kink per dye study.

## 2011-07-07 NOTE — Telephone Encounter (Signed)
It is in the SQ of the [employer].  It flushed fine in OR & was a smooth curve but...  Probably needs just repositioning in the wound - set up in OR for tomorrow or next week.  MAC time .

## 2011-07-07 NOTE — Progress Notes (Signed)
PAC very resistant to flush and unable to obtain blood return despite several position changes and deep breathing and coughing. Noted that port is proximal to left clavicle. ? Kinked off . Dr. Truett Perna notified and ordered dye study in radiology dept. Dye study confirms kink/coil in cath. Per Dr. Truett Perna, it needs revision before it can be used for chemo. Explained rationale to patient and showed him the xray and he understands and agrees. Patient will call office with reschedule date of his [facility] revision and he will contact office so chemo can be rescheduled.

## 2011-07-08 ENCOUNTER — Telehealth (INDEPENDENT_AMBULATORY_CARE_PROVIDER_SITE_OTHER): Payer: Self-pay

## 2011-07-08 ENCOUNTER — Encounter (HOSPITAL_BASED_OUTPATIENT_CLINIC_OR_DEPARTMENT_OTHER): Payer: Self-pay | Admitting: *Deleted

## 2011-07-08 ENCOUNTER — Telehealth: Payer: Self-pay | Admitting: *Deleted

## 2011-07-08 MED ORDER — TEMAZEPAM 15 MG PO CAPS: 15.0000 mg | ORAL_CAPSULE | Freq: Every evening | ORAL | Status: DC | PRN

## 2011-07-08 NOTE — Telephone Encounter (Signed)
Left message with pt's father to call me back from his v.m. Message he left me earlier today.

## 2011-07-08 NOTE — Telephone Encounter (Signed)
Called pt to make him aware prescription for sleep was called to pharmacy. Pt reports he is scheduled for port a cath revision, asking who he can contact to discuss not being billed by the hospital for the procedure. Suggested he speak with the billing dept on 3/25 when he comes in for genetic counseling.

## 2011-07-11 ENCOUNTER — Ambulatory Visit (HOSPITAL_BASED_OUTPATIENT_CLINIC_OR_DEPARTMENT_OTHER): Payer: BC Managed Care – PPO | Admitting: Genetic Counselor

## 2011-07-11 ENCOUNTER — Encounter (HOSPITAL_BASED_OUTPATIENT_CLINIC_OR_DEPARTMENT_OTHER)
Admission: RE | Admit: 2011-07-11 | Discharge: 2011-07-11 | Disposition: A | Discharge status: Home / Self Care | Payer: BC Managed Care – PPO | Source: Ambulatory Visit | Attending: Surgery | Admitting: Surgery

## 2011-07-11 ENCOUNTER — Other Ambulatory Visit: Payer: BC Managed Care – PPO

## 2011-07-11 ENCOUNTER — Telehealth: Payer: Self-pay | Admitting: *Deleted

## 2011-07-11 ENCOUNTER — Other Ambulatory Visit: Payer: Self-pay | Admitting: *Deleted

## 2011-07-11 DIAGNOSIS — C182 Malignant neoplasm of ascending colon: Secondary | ICD-10-CM

## 2011-07-11 LAB — BASIC METABOLIC PANEL
BUN: 17 mg/dL (ref 6–23)
CO2: 29 mEq/L (ref 19–32)
Chloride: 102 mEq/L (ref 96–112)
Creatinine, Ser: 0.8 mg/dL (ref 0.50–1.35)
GFR calc Af Amer: >90 mL/min (ref >90)
Glucose, Bld: 104 mg/dL — ABNORMAL HIGH (ref 70–99)

## 2011-07-11 NOTE — Telephone Encounter (Signed)
Patient reports PAC revision scheduled for 07/12/11 at [facility]. Will schedule treatment for 3/27. POF to scheduler.

## 2011-07-11 NOTE — Progress Notes (Signed)
Dr.  Truett Perna requested a consultation for genetic counseling and risk assessment for Greg Robertson, a 61 y.o. male, for discussion of his personal history of colon cancer. He presents to clinic today to discuss the possibility of a genetic predisposition to cancer, and to further clarify his risks, as well as his family members' risks for cancer.   HISTORY OF PRESENT ILLNESS: In January 2013, at the age of 34, Greg Robertson was diagnosed with cancer of the ascending colon. This was treated with surgery on June 02, 2011.     Past Medical History  Diagnosis Date  . Diabetes mellitus   . Glaucoma   . Allergic rhinitis   . Prostate nodule   . Colonic mass   . Cancer   . Substance abuse   . Cancer of ascending colon, 7cm 05/25/2011  . Hypertension   . Pneumonia 1979 or 1980  . Anemia     Past Surgical History  Procedure Date  . Finger surgery 2009    right middle  . Nasal fracture surgery 1968  . Tonsillectomy 1957 - approximate  . 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 Smoking status: Former Smoker -- 1.0 packs/day for 15 years    Types: Cigarettes    Quit date: 04/18/1993  . Smokeless tobacco: Never Used   Comment: marijuana every night   . Alcohol Use: Yes     1 drink every 2 days    PERSONAL RISK ASSESSMENT FACTORS: The patient had MSI and IHC testing on the tumor.  The results indicated a MSI-high and loss of PMS2.  This is indicative of Lynch syndrome as a result of a mutation in the PMS2 gene.  FAMILY HISTORY:  We obtained a detailed, 4-generation family history.  Significant diagnoses are listed below: Family History  Problem Relation Age of Onset  . Colon polyps Father   . Lung cancer Father   . Diabetes Father   . Breast cancer Mother   . Pancreatitis Mother     intestinal adhesions  The patient has two sisters and one brother, none of whom have had cancer.  The patient's  mother had breast cancer in her 77s and died of a peritoneal infection as a result of surgery on intestinal lesions.  She was an only child.  Her mother died of breast cancer at age 75, and her maternal aunt had throat cancer.    The patient's father was diagnosed with lung cancer at age 27 and has a history of colon polyps.  He is a former smoker.    There is no other cancer history reported on this side of the family.  Patient's maternal ancestors are of [place] and [place]-German descent, and paternal ancestors are of [place] and [place] descent. There is no reported Ashkenazi Jewish ancestry. There is no known consanguinity.  GENETIC COUNSELING RISK ASSESSMENT, DISCUSSION, AND SUGGESTED FOLLOW UP: We reviewed the natural history and genetic etiology of sporadic, familial and hereditary cancer syndromes.  We reviewed that 5-10% of colon cancers are hereditary, and approximately 3% of colon cancers are a result of Lynch syndrome.  We reviewed the red flags of Lynch syndrome, his personal history suggesting a PMS2 mutation, and the dominant inheritance pattern.  We discussed genetic testing, and that no all mutations are found through this test.  If he tests positive then his siblings and other family members can be tested.  If he tests negative,  we would follow Greg Robertson and his siblings as if they have Lynch syndrome.  The patient's personal history of colon cancer is suggestive of the following possible diagnosis: Lynch Syndrome  We discussed that identification of a hereditary cancer syndrome may help his care providers tailor the patients medical management. If a mutation indicating Lynch syndrome is detected in this case, the [employer] recommendations would include increased cancer survellience. If a mutation is detected, the patient will be referred back to the referring provider and to any additional appropriate care providers to discuss the relevant options.    If a mutation is not found in the patient, we would recommend he be followed as if he had Lynch syndrome. Cancer surveillance options would be discussed for the patient according to the appropriate standard National Comprehensive Cancer Network and American Cancer Society guidelines, with consideration of their personal and family history risk factors. In this case, the patient will be referred back to their care providers for discussions of management.   After considering the risks, benefits, and limitations, the patient provided informed consent for  the following  testing:  PMS2 and reflex back to [facility] through [employer].   Per the patient's request, we will contact him by telephone to discuss these results. A follow up genetic counseling visit will be scheduled if indicated.  The patient was seen for a total of 45 minutes, greater than 50% of which was spent face-to-face counseling.  This plan is being carried out per Dr. Kalman Drape recommendations.  This note will also be sent to the referring provider via the electronic medical record. The patient will be supplied with a summary of this genetic counseling discussion as well as educational information on the discussed hereditary cancer syndromes following the conclusion of their visit.   Patient was discussed with Dr. Drue Second.   EDUCATIONAL INFORMATION SUPPLIED TO PATIENT AT ENCOUNTER:  Lynch Syndrome Brochure   _______________________________________________________________________ For Office Staff:  Number of people involved in session: 2 Was an Intern/ student involved with case: not applicable

## 2011-07-12 ENCOUNTER — Ambulatory Visit (HOSPITAL_COMMUNITY): Payer: BC Managed Care – PPO

## 2011-07-12 ENCOUNTER — Encounter (HOSPITAL_BASED_OUTPATIENT_CLINIC_OR_DEPARTMENT_OTHER)
Admission: RE | Disposition: A | Discharge status: Home / Self Care | Payer: Self-pay | Source: Ambulatory Visit | Attending: Surgery

## 2011-07-12 ENCOUNTER — Ambulatory Visit (HOSPITAL_BASED_OUTPATIENT_CLINIC_OR_DEPARTMENT_OTHER): Payer: BC Managed Care – PPO | Admitting: Anesthesiology

## 2011-07-12 ENCOUNTER — Encounter (HOSPITAL_BASED_OUTPATIENT_CLINIC_OR_DEPARTMENT_OTHER): Payer: Self-pay | Admitting: *Deleted

## 2011-07-12 ENCOUNTER — Encounter (HOSPITAL_BASED_OUTPATIENT_CLINIC_OR_DEPARTMENT_OTHER): Payer: Self-pay | Admitting: Anesthesiology

## 2011-07-12 ENCOUNTER — Ambulatory Visit (HOSPITAL_BASED_OUTPATIENT_CLINIC_OR_DEPARTMENT_OTHER)
Admission: RE | Admit: 2011-07-12 | Discharge: 2011-07-12 | Disposition: A | Discharge status: Home / Self Care | Payer: BC Managed Care – PPO | Source: Ambulatory Visit | Attending: Surgery | Admitting: Surgery

## 2011-07-12 DIAGNOSIS — T82898A Other specified complication of vascular prosthetic devices, implants and grafts, initial encounter: Secondary | ICD-10-CM

## 2011-07-12 DIAGNOSIS — E119 Type 2 diabetes mellitus without complications: Secondary | ICD-10-CM | POA: Insufficient documentation

## 2011-07-12 DIAGNOSIS — C779 Secondary and unspecified malignant neoplasm of lymph node, unspecified: Secondary | ICD-10-CM | POA: Insufficient documentation

## 2011-07-12 DIAGNOSIS — H409 Unspecified glaucoma: Secondary | ICD-10-CM | POA: Insufficient documentation

## 2011-07-12 DIAGNOSIS — Y849 Medical procedure, unspecified as the cause of abnormal reaction of the patient, or of later complication, without mention of misadventure at the time of the procedure: Secondary | ICD-10-CM | POA: Insufficient documentation

## 2011-07-12 DIAGNOSIS — T82598A Other mechanical complication of other cardiac and vascular devices and implants, initial encounter: Secondary | ICD-10-CM | POA: Insufficient documentation

## 2011-07-12 DIAGNOSIS — C182 Malignant neoplasm of ascending colon: Secondary | ICD-10-CM | POA: Insufficient documentation

## 2011-07-12 DIAGNOSIS — I10 Essential (primary) hypertension: Secondary | ICD-10-CM | POA: Insufficient documentation

## 2011-07-12 LAB — GLUCOSE, CAPILLARY: Glucose-Capillary: 106 mg/dL — ABNORMAL HIGH (ref 70–99)

## 2011-07-12 IMAGING — CR DG CHEST 1V PORT
1 series · 1 of 1 positions shown · non-contrast
Comparison: [DATE]

CLINICAL DATA: Post chest port insertion

PORTABLE CHEST - 1 VIEW

[view not recorded]
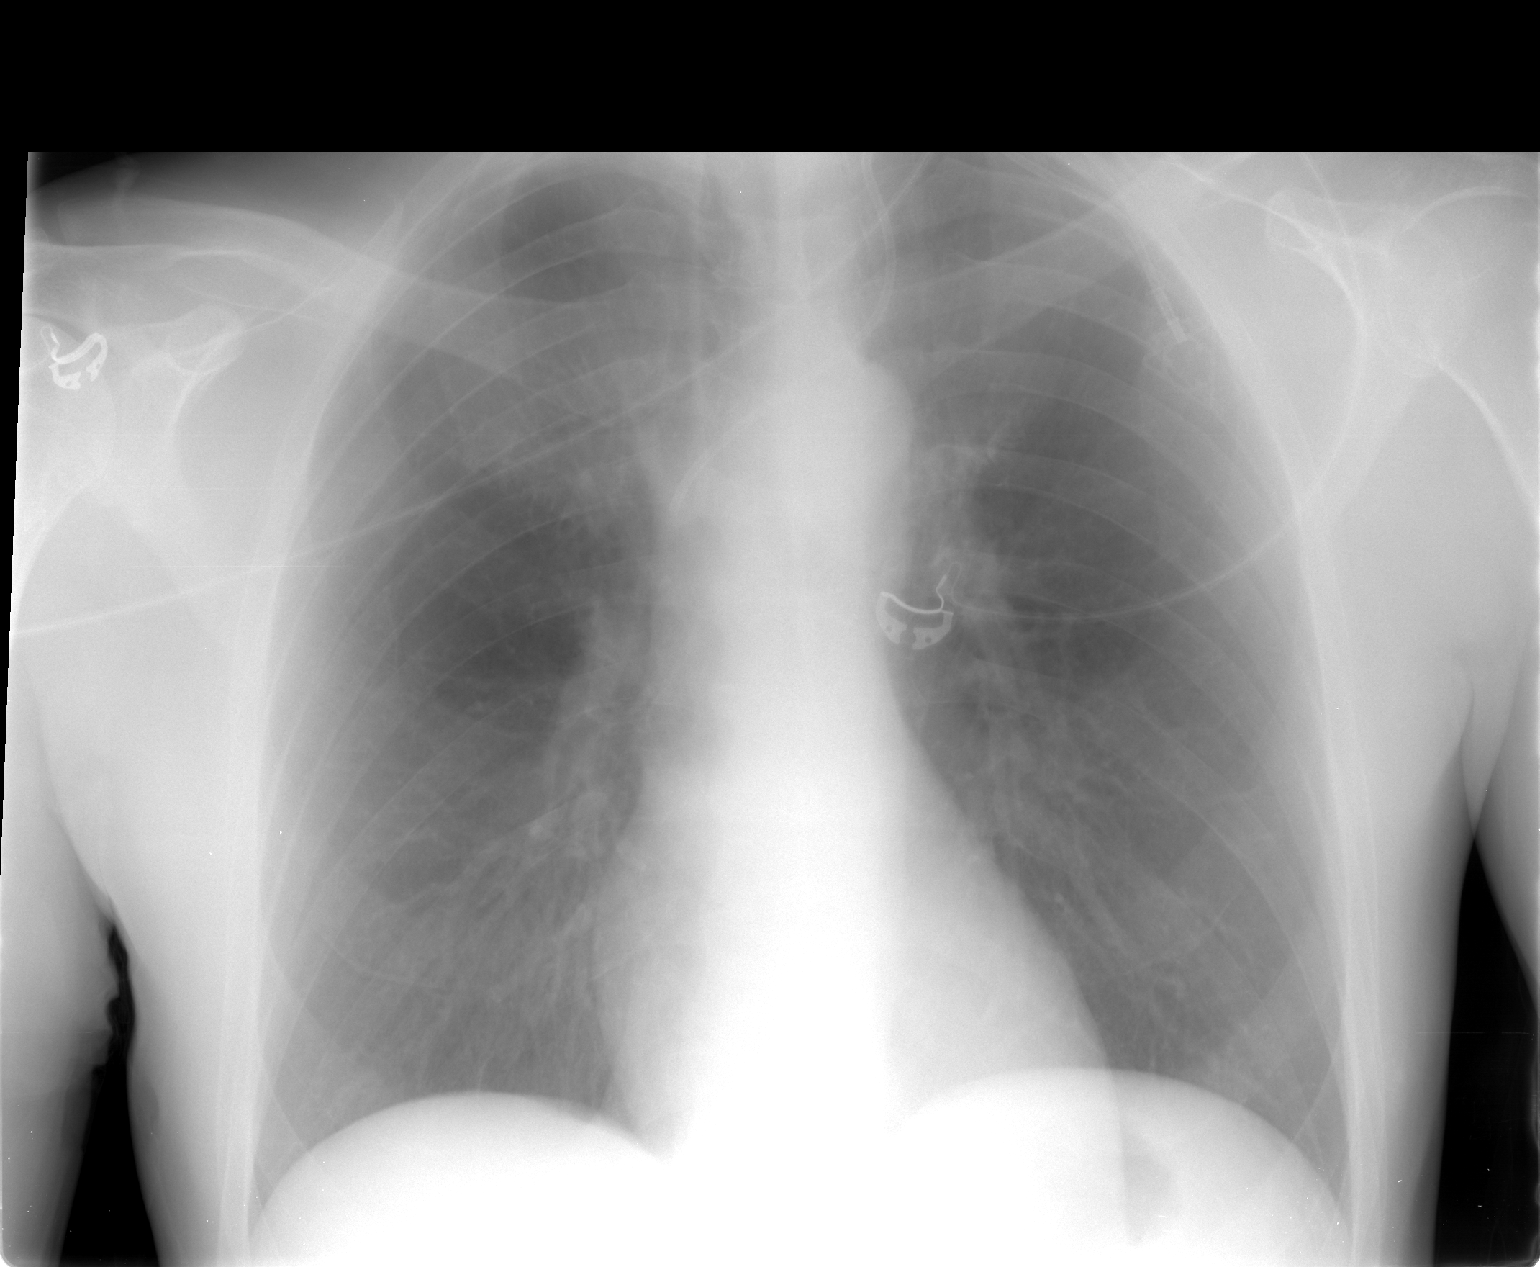

[1 of 1 positions shown; findings below may reference images not displayed]

FINDINGS: Left chest power port with its tip in the upper SVC.

The previously demonstrated proximal catheter loop/kink is no
longer visualized.

Visualized lungs are essentially clear.  No pleural effusion or
pneumothorax.

The heart is normal in size.
IMPRESSION: Left chest power port with its tip in the upper SVC.  No
pneumothorax.

Previously demonstrated proximal catheter loop/kink is no longer
visualized.

## 2011-07-12 SURGERY — REVISION, PORT-A-CATH INSERTION
Anesthesia: Monitor Anesthesia Care | Site: Chest | Wound class: Clean

## 2011-07-12 MED ORDER — BUPIVACAINE HCL (PF) 0.25 % IJ SOLN
INTRAMUSCULAR | Status: DC | PRN
  Administered 2011-07-12: 5 mL

## 2011-07-12 MED ORDER — CEFAZOLIN SODIUM-DEXTROSE 2-3 GM-% IV SOLR
2.0000 g | INTRAVENOUS | Status: AC
  Administered 2011-07-12: 1 g via INTRAVENOUS

## 2011-07-12 MED ORDER — CHLORHEXIDINE GLUCONATE 4 % EX LIQD: 1.0000 "application " | Freq: Once | CUTANEOUS | Status: DC

## 2011-07-12 MED ORDER — FENTANYL CITRATE 0.05 MG/ML IJ SOLN
INTRAMUSCULAR | Status: DC | PRN
  Administered 2011-07-12: 25 ug via INTRAVENOUS
  Administered 2011-07-12: 50 ug via INTRAVENOUS

## 2011-07-12 MED ORDER — FENTANYL CITRATE 0.05 MG/ML IJ SOLN: 25.0000 ug | INTRAMUSCULAR | Status: DC | PRN

## 2011-07-12 MED ORDER — LIDOCAINE-EPINEPHRINE (PF) 1 %-1:200000 IJ SOLN
INTRAMUSCULAR | Status: DC | PRN
  Administered 2011-07-12: 5 mL

## 2011-07-12 MED ORDER — ONDANSETRON HCL 4 MG/2ML IJ SOLN
INTRAMUSCULAR | Status: DC | PRN
  Administered 2011-07-12: 4 mg via INTRAVENOUS

## 2011-07-12 MED ORDER — HEPARIN SOD (PORK) LOCK FLUSH 100 UNIT/ML IV SOLN
INTRAVENOUS | Status: DC | PRN
  Administered 2011-07-12: 200 [IU] via INTRAVENOUS

## 2011-07-12 MED ORDER — PROPOFOL 10 MG/ML IV EMUL
INTRAVENOUS | Status: DC | PRN
  Administered 2011-07-12: 50 ug/kg/min via INTRAVENOUS

## 2011-07-12 MED ORDER — MIDAZOLAM HCL 5 MG/5ML IJ SOLN
INTRAMUSCULAR | Status: DC | PRN
  Administered 2011-07-12: 2 mg via INTRAVENOUS

## 2011-07-12 MED ORDER — HEPARIN (PORCINE) IN NACL 2-0.9 UNIT/ML-% IJ SOLN
INTRAMUSCULAR | Status: DC | PRN
  Administered 2011-07-12: 1 via INTRAVENOUS

## 2011-07-12 MED ORDER — LIDOCAINE HCL (CARDIAC) 20 MG/ML IV SOLN
INTRAVENOUS | Status: DC | PRN
  Administered 2011-07-12: 50 mg via INTRAVENOUS

## 2011-07-12 MED ORDER — LACTATED RINGERS IV SOLN
INTRAVENOUS | Status: DC
  Administered 2011-07-12: 12:00:00 via INTRAVENOUS

## 2011-07-12 SURGICAL SUPPLY — 41 items
BAG DECANTER FOR FLEXI CONT (MISCELLANEOUS) ×3 IMPLANT
BLADE SURG 15 STRL LF DISP TIS (BLADE) ×2 IMPLANT
BLADE SURG 15 STRL SS (BLADE) ×1
CANISTER SUCTION 1200CC (MISCELLANEOUS) IMPLANT
CHLORAPREP W/TINT 26ML (MISCELLANEOUS) ×3 IMPLANT
COVER MAYO STAND STRL (DRAPES) ×3 IMPLANT
COVER PROBE 5X48 (MISCELLANEOUS) ×1
COVER TABLE BACK 60X90 (DRAPES) ×3 IMPLANT
DECANTER SPIKE VIAL GLASS SM (MISCELLANEOUS) ×3 IMPLANT
DRAPE C-ARM 42X72 X-RAY (DRAPES) ×3 IMPLANT
DRAPE LAPAROTOMY TRNSV 102X78 (DRAPE) ×3 IMPLANT
DRAPE UTILITY XL STRL (DRAPES) ×3 IMPLANT
DRSG TEGADERM 2-3/8X2-3/4 SM (GAUZE/BANDAGES/DRESSINGS) ×3 IMPLANT
DRSG TEGADERM 4X4.75 (GAUZE/BANDAGES/DRESSINGS) ×3 IMPLANT
DURAPREP 26ML APPLICATOR (WOUND CARE) ×3 IMPLANT
ELECT REM PT RETURN 9FT ADLT (ELECTROSURGICAL) ×3
ELECTRODE REM PT RTRN 9FT ADLT (ELECTROSURGICAL) ×2 IMPLANT
GAUZE SPONGE 4X4 12PLY STRL LF (GAUZE/BANDAGES/DRESSINGS) ×3 IMPLANT
GLOVE BIOGEL PI IND STRL 8 (GLOVE) ×2 IMPLANT
GLOVE BIOGEL PI INDICATOR 8 (GLOVE) ×1
GLOVE ECLIPSE 6.5 STRL STRAW (GLOVE) ×3 IMPLANT
GLOVE ECLIPSE 8.0 STRL XLNG CF (GLOVE) ×3 IMPLANT
GOWN PREVENTION PLUS XLARGE (GOWN DISPOSABLE) ×3 IMPLANT
HUBER PLUS ×3 IMPLANT
IV HEPARIN 1000UNITS/500ML (IV SOLUTION) ×6 IMPLANT
IV KIT MINILOC 20X1 SAFETY (NEEDLE) IMPLANT
KIT CVR 48X5XPRB PLUP LF (MISCELLANEOUS) ×2 IMPLANT
NEEDLE HYPO 25X1 1.5 SAFETY (NEEDLE) ×3 IMPLANT
PACK BASIN DAY SURGERY FS (CUSTOM PROCEDURE TRAY) ×3 IMPLANT
PENCIL BUTTON HOLSTER BLD 10FT (ELECTRODE) ×3 IMPLANT
SUT MON AB 4-0 PC3 18 (SUTURE) ×3 IMPLANT
SUT PROLENE 2 0 CT2 30 (SUTURE) ×3 IMPLANT
SUT VIC AB 3-0 SH 27 (SUTURE)
SUT VIC AB 3-0 SH 27X BRD (SUTURE) IMPLANT
SYR 5ML LUER SLIP (SYRINGE) ×3 IMPLANT
SYR CONTROL 10ML LL (SYRINGE) ×6 IMPLANT
TOWEL OR 17X24 6PK STRL BLUE (TOWEL DISPOSABLE) ×3 IMPLANT
TOWEL OR NON WOVEN STRL DISP B (DISPOSABLE) ×3 IMPLANT
TUBE CONNECTING 20X1/4 (TUBING) IMPLANT
WATER STERILE IRR 1000ML POUR (IV SOLUTION) ×3 IMPLANT
YANKAUER SUCT BULB TIP NO VENT (SUCTIONS) IMPLANT

## 2011-07-12 NOTE — Op Note (Signed)
07/12/2011  2:03 PM  PATIENT:  Greg Robertson  61 y.o. male  Patient Care Team: Ernestina Penna, MD as PCP - General (Family Medicine) Louis Meckel, MD as Consulting Physician (Gastroenterology)  PRE-OPERATIVE DIAGNOSIS:  non functioning porta cath  POST-OPERATIVE DIAGNOSIS:  non functioning porta cath  PROCEDURE:  Procedure(s): PORT A CATH REVISION  SURGEON:  Surgeon(s): Ardeth Sportsman, MD  ASSISTANT: none   ANESTHESIA:   local and IV sedation  EBL:  Total I/O In: 200 [I.V.:200] Out: -   Delay start of Pharmacological VTE agent (>24hrs) due to surgical blood loss or risk of bleeding:  no  DRAINS: none   SPECIMEN:  No Specimen  DISPOSITION OF SPECIMEN:  N/A  COUNTS:  YES  PLAN OF CARE: Discharge to home after PACU  PATIENT DISPOSITION:  PACU - hemodynamically stable.  INDICATION: Patient is a pleasant male with node positive colon cancer.  He is status post segmental resection. He was evaluated by oncology and felt to benefit from chemotherapy. I placed a Porta catheter. It flushed and aspirated well at the time of surgery. At the time of initiation chemotherapy, it no longer did. X-ray showed a kinking of the catheter in the infraclavicular pocket. Request is made for replacement.  Technique of revision discussed. Possibility of replacement was discussed. Risks, benefits, alternatives were discussed.  OR FINDINGS: There is a small kink in the subcutaneous tissue interclavicular, a pocket of catheter. And the redundancy was removed. The tip of the catheter is in the superior vena cava.  Is an 8 [place] power port. It goes through the left internal jugular vein  DESCRIPTION:   Informed consent was confirmed. Patient was brought the operating room. and positioned supine. Arms were tucked. The patient underwent deep sedation. Neck and chest were clipped and prepped and draped in a sterile fashion. A surgical timeout confirmed our plan.  I placed a field block of  local anesthesia on the chest  I made an incision in the lateral infraclavicular pocket. I located the subcutaneous pocket.  I located the catheter as it exited the port and eviscerated out a small excess curl ofcatheter and with a subtle kink. With the loop eviscerated the catheter flushed and aspirated well. I trimmed out the excess catheter remove any redundancy and reattached the catheter to the port. Catheter flushed and aspirated well by Demetrios Isaacs needle. I did a final flush of full-strength heparin (100 units per milliliter).  On final fluoro reevaluation the tip seen to be in good position in the SVC.    I closed the wound using 3-0 vicryl deep dermal & 4 Monocryl subcuticular stitch & placed sterile dressings. Patient should go home later today. Catheter is okay to use.

## 2011-07-12 NOTE — Transfer of Care (Signed)
Immediate Anesthesia Transfer of Care Note  Patient: Greg Robertson  Procedure(s) Performed: Procedure(s) (LRB): PORT A CATH REVISION (Left)  Patient Location: PACU  Anesthesia Type: MAC  Level of Consciousness: awake, alert  and oriented  Airway & Oxygen Therapy: Patient Spontanous Breathing  Post-op Assessment: Report given to PACU RN and Post -op Vital signs reviewed and stable  Post vital signs: Reviewed and stable  Complications: No apparent anesthesia complications

## 2011-07-12 NOTE — Discharge Instructions (Signed)
PORT-A-CATHETER PLACEMENT:  POST OP INSTRUCTIONS  1. DIET: Follow a light bland diet the first night after arrival home, such as soup, liquids, crackers, etc.  Be sure to include lots of fluids daily.  Avoid fast food or heavy meals as your are more likely to get nauseated.   2. Take your usually prescribed home medications unless otherwise directed. 3. PAIN CONTROL: a. Pain is best controlled by a usual combination of three different methods TOGETHER: i. Ice/Heat ii. Over the counter pain medication iii. Prescription pain medication b. Most patients will experience some swelling and bruising around the incisions.  Ice packs or heating pads (30-60 minutes up to 6 times a day) will help. Use ice for the first few days to help decrease swelling and bruising, then switch to heat to help relax tight/sore spots and speed recovery.  Some people prefer to use ice alone, heat alone, alternating between ice & heat.  Experiment to what works for you.  Swelling and bruising can take several weeks to resolve.   c. It is helpful to take an over-the-counter pain medication regularly for the first few weeks.  Using acetaminophen (Tylenol, etc) 500-650mg  four times a day (every meal & bedtime) is usually safest since NSAIDs are not advisable in patients with kidney disease. d. A  prescription for pain medication (such as oxycodone, hydrocodone, etc) may be given to you upon discharge.  Take your pain medication as prescribed.  i. If you are having problems/concerns with the prescription medicine (does not control pain, nausea, vomiting, rash, itching, etc), please call us [phone] to see if we need to switch you to a different pain medicine that will work better for you and/or control your side effect better. ii. If you need a refill on your pain medication, please contact your pharmacy.  They will contact our office to request authorization. Prescriptions will not be filled after 5 pm or on  week-ends. 4. Avoid getting constipated.  Between the surgery and the pain medications, it is common to experience some constipation.  Increasing fluid intake and taking a fiber supplement (such as Metamucil, Citrucel, FiberCon, MiraLax, etc) 1-2 times a day regularly will usually help prevent this problem from occurring.  A mild laxative (prune juice, Milk of Magnesia, MiraLax, etc) should be taken according to package directions if there are no bowel movements after 48 hours.   5. Wash / shower every day.  You may shower over the dressings as they are waterproof.  Continue to shower over incision(s) after the dressing is off. 6. The Chemotherapy / Oncology nurse will remove your waterproof bandages in their office a few days after surgery.  Do not remove the bandages unless you are 5 days out from surgery 7. ACTIVITIES as tolerated:   a. You may resume regular (light) daily activities beginning the next day--such as daily self-care, walking, climbing stairs--gradually increasing activities as tolerated.  If you can walk 30 minutes without difficulty, it is safe to try more intense activity such as jogging, treadmill, bicycling, low-impact aerobics, swimming, etc. b. Save the most intensive and strenuous activity for last such as push-ups, heavy lifting, contact sports, etc  Refrain from any heavy lifting or straining until you are off narcotics for pain control.   c. DO NOT PUSH THROUGH PAIN.  Let pain be your guide: If it hurts to do something, don't do it.  Pain is your body warning you to avoid that activity for another week until the pain goes down.  d. Bonita Quin may drive when you are no longer taking prescription pain medication, you can comfortably wear a seatbelt, and you can safely maneuver your car and apply brakes. e. Bonita Quin may have sexual intercourse when it is comfortable.  8. FOLLOW UP with the Oncology / Chemotherapy nurses closely after surgery. a. Please call [facility] at [phone] only as  needed. b. The [facility] nurses & Nephrology usually follow you closely, making the need for follow-up in our office redundant and therefore not needed.  If they or you have concerns, please call us for possible follow-up in our office 9. IF YOU HAVE DISABILITY OR FAMILY LEAVE FORMS, BRING THEM TO THE OFFICE FOR PROCESSING.  DO NOT GIVE THEM TO YOUR DOCTOR.   WHEN TO CALL us [phone]: 1. Poor pain control 2. Reactions / problems with new medications (rash/itching, nausea, etc)  3. Fever over 101.5 F (38.5 C) 4. Worsening swelling or bruising 5. Continued bleeding from incision. 6. Increased pain, redness, or drainage from the incision   The clinic staff is available to answer your questions during regular business hours (8:30am-5pm).  Please don't hesitate to call and ask to speak to one of our nurses for clinical concerns.   If you have a medical emergency, go to the nearest emergency room or call 911.  A surgeon from [facility] is always on call at the [facility], [place] [address], [address], [place], [place]  [address] ? MAIN: [phone] ? TOLL FREE: [phone] ?  FAX [phone] [email or web address]  [address] [place], [place] [address] [phone]    Post Anesthesia Home Care Instructions  Activity: Get plenty of rest for the remainder of the day. A responsible adult should stay with you for 24 hours following the procedure.  For the next 24 hours, DO NOT: -Drive a car -Advertising copywriter -Drink alcoholic beverages -Take any medication unless instructed by your physician -Make any legal decisions or sign important papers.  Meals: Start with liquid foods such as gelatin or soup. Progress to regular foods as tolerated. Avoid greasy, spicy, heavy foods. If nausea and/or vomiting occur, drink only clear liquids until the nausea and/or vomiting subsides. Call your  physician if vomiting continues.  Special Instructions/Symptoms: Your throat may feel dry or sore from the anesthesia or the breathing tube placed in your throat during surgery. If this causes discomfort, gargle with warm salt water. The discomfort should disappear within 24 hours.

## 2011-07-12 NOTE — Anesthesia Procedure Notes (Signed)
Procedure Name: MAC Performed by: Ketih Goodie W Pre-anesthesia Checklist: Patient identified, Timeout performed, Emergency Drugs available, Suction available and Patient being monitored Oxygen Delivery Method: Simple face mask       

## 2011-07-12 NOTE — Anesthesia Preprocedure Evaluation (Signed)
Anesthesia Evaluation  Patient identified by MRN, date of birth, ID band Patient awake    Reviewed: Allergy & Precautions, H&P , NPO status , Patient's Chart, lab work & pertinent test results  Airway Mallampati: I TM Distance: >3 FB Neck ROM: Full    Dental No notable dental hx. (+) Teeth Intact and Partial Lower   Pulmonary neg pulmonary ROS,    Pulmonary exam normal       Cardiovascular hypertension, On Medications     Neuro/Psych negative neurological ROS  negative psych ROS   GI/Hepatic negative GI ROS, Neg liver ROS,   Endo/Other  Diabetes mellitus-, Well Controlled, Type 2, Oral Hypoglycemic Agents  Renal/GU negative Renal ROS  negative genitourinary   Musculoskeletal   Abdominal   Peds  Hematology negative hematology ROS (+)   Anesthesia Other Findings   Reproductive/Obstetrics negative OB ROS                           Anesthesia Physical Anesthesia Plan  ASA: III  Anesthesia Plan: MAC   Post-op Pain Management:    Induction: Intravenous  Airway Management Planned: Simple Face Mask  Additional Equipment:   Intra-op Plan:   Post-operative Plan:   Informed Consent: I have reviewed the patients History and Physical, chart, labs and discussed the procedure including the risks, benefits and alternatives for the proposed anesthesia with the patient or authorized representative who has indicated his/her understanding and acceptance.     Plan Discussed with: CRNA  Anesthesia Plan Comments:         Anesthesia Quick Evaluation

## 2011-07-12 NOTE — H&P (Signed)
Greg Robertson  [birth year] [id number]  Patient Care Team: Ernestina Penna, MD as PCP - General (Family Medicine) Louis Meckel, MD as Consulting Physician (Gastroenterology)  This patient is a 61 y.o.male who presents today for surgical evaluation.   For: Nonfunctioning Port-A-Cath  Patient is a note positive colon cancer person.  He underwent a partial resection.  He would chemotherapy.  I placed a Porta catheter in him.  He had a little redundant catheter that I placed a gentle curve in the infraclavicular pocket.  It flushed and aspirated fine.  Later in the chemotherapy administration office, it would not work well.  They refuse to use it.  They request repositioning.  Patient Active Problem List  Diagnoses  . Nonspecific abnormal finding in stool contents  . Diabetes mellitus without mention of complication  . Glaucoma  . Cancer of ascending colon, 7cm    Past Medical History  Diagnosis Date  . Diabetes mellitus   . Glaucoma   . Allergic rhinitis   . Prostate nodule   . Colonic mass   . Cancer   . Substance abuse   . Cancer of ascending colon, 7cm 05/25/2011  . Hypertension   . Pneumonia 1979 or 1980  . Anemia     Past Surgical History  Procedure Date  . Finger surgery 2009    right middle  . Nasal fracture surgery 1968  . Tonsillectomy 1957 - approximate  . Laparoscopic assisted ileocolectomy on 06/02/11 for adenocarcinoma     History   Social History  . Marital Status: Single    Spouse Name: N/A    Number of Children: 0  . Years of Education: N/A   Occupational History  . sales    Social History Main Topics  . Smoking status: Former Smoker -- 1.0 packs/day for 15 years    Types: Cigarettes    Quit date: 04/18/1993  . Smokeless tobacco: Never Used   Comment: marijuana every night   . Alcohol Use: Yes     1 drink every 2 days  . Drug Use: Yes    Special: Marijuana     once a night marijuana  . Sexually Active: Not on file   Other Topics  Concern  . Not on file   Social History Narrative  . No narrative on file    Family History  Problem Relation Age of Onset  . Colon polyps Father   . Lung cancer Father   . Diabetes Father   . Breast cancer Mother   . Pancreatitis Mother     intestinal adhesions    Current Facility-Administered Medications  Medication Dose Route Frequency Provider Last Rate Last Dose  . ceFAZolin (ANCEF) IVPB 2 g/50 mL premix  2 g Intravenous 60 min Pre-Op Ardeth Sportsman, MD      . chlorhexidine (HIBICLENS) 4 % liquid 1 application  1 application Topical Once Ardeth Sportsman, MD      . chlorhexidine (HIBICLENS) 4 % liquid 1 application  1 application Topical Once Ardeth Sportsman, MD      . lactated ringers infusion   Intravenous Continuous Zenon Mayo, MD 20 mL/hr at 07/12/11 1152       No Known Allergies  BP 149/84  Pulse 73  Temp(Src) 98.2 F (36.8 C) (Oral)  Resp 18  Ht 6' 3.5" (1.918 m)  Wt 167 lb (75.751 kg)  BMI 20.60 kg/m2  SpO2 99%  Greg Robertson  April 22, 1950  [id number]  Patient Care  Team:  Rudi Heap, MD as PCP - General (Family Medicine)  Louis Meckel, MD as Consulting Physician (Gastroenterology)  This patient is a 61 y.o.male who presents today for surgical evaluation at the request of Dr. Arlyce Dice.  Reason for visit: Newly diagnosed cancer and proximal colon.  The patient is a pleasant active male. He began to have some abdominal discomfort. He started Prilosec and TUMS. His appetite decreased. He was found to be anemic. He was sent for colonoscopy and endoscopy to rule out a GI tract etiology. He was found to have a large mass in his proximal colon. Biopsy showed cancer. Therefore, Dr. Arlyce Dice sent the patient to me for evaluation.  The patient is rather active. No prior problems surgeries. He usually has a bowel movement everyday medications had some loose stools. He is insulin requiring diabetic. No major changes on his diabetic regimen. Did have a period  of decreased appetite and weight loss. However he feels like he is rallying from that.  Patient Active Problem List   Diagnoses   .  Nonspecific abnormal finding in stool contents   .  Diabetes mellitus without mention of complication   .  Glaucoma   .  Cancer of ascending colon, 7cm    Past Medical History   Diagnosis  Date   .  Diabetes mellitus    .  Anemia    .  Glaucoma    .  Pneumonia    .  Allergic rhinitis    .  Prostate nodule    .  Colonic mass    .  Cancer    .  Substance abuse    .  Cancer of ascending colon, 7cm  05/25/2011    Past Surgical History   Procedure  Date   .  Tonsillectomy  1957 - approximate   .  Finger surgery  2009     right middle   .  Nasal fracture surgery  1968    History    Social History   .  Marital Status:  Single     Spouse Name:  N/A     Number of Children:  0   .  Years of Education:  N/A    Occupational History   .  sales     Social History Main Topics   .  Smoking status:  Former Smoker     Quit date:  04/18/1993   .  Smokeless tobacco:  Never Used   .  Alcohol Use:  Yes      1 drink every 2 days   .  Drug Use:  Yes      once a night marijuana   .  Sexually Active:  Not on file    Other Topics  Concern   .  Not on file    Social History Narrative   .  No narrative on file    Family History   Problem  Relation  Age of Onset   .  Colon polyps  Father    .  Lung cancer  Father    .  Diabetes  Father    .  Breast cancer  Mother    .  Pancreatitis  Mother       intestinal adhesions   Current outpatient prescriptions:ACCU-CHEK AVIVA PLUS test strip, Daily., Disp: , Rfl: ; Ascorbic Acid (VITAMIN C PO), Take 1 tablet by mouth daily., Disp: , Rfl: ; Aspirin (ECOTRIN PO), Take 1 tablet by mouth daily., Disp: ,  Rfl: ; brimonidine-timolol (COMBIGAN) 0.2-0.5 % ophthalmic solution, Place 1 drop into both eyes daily., Disp: , Rfl:  insulin aspart (NOVOLOG) 100 UNIT/ML injection, Inject into the skin. Take 1 unit for ever 6 grams  of carbs before each meal, Disp: , Rfl: ; insulin glargine (LANTUS) 100 UNIT/ML injection, Inject 22 Units into the skin at bedtime., Disp: , Rfl: ; latanoprost (XALATAN) 0.005 % ophthalmic solution, Place 1 drop into both eyes at bedtime., Disp: , Rfl: ; levocetirizine (XYZAL) 5 MG tablet, Take 5 mg by mouth every evening., Disp: , Rfl:  LISINOPRIL PO, Take 1 tablet by mouth daily., Disp: , Rfl: ; Misc. Devices (NASADOCK) MISC, Place 1 spray into both nostrils daily., Disp: , Rfl: ; Multiple Vitamin (MULTIVITAMIN) tablet, Take 1 tablet by mouth daily., Disp: , Rfl: ; SIMVASTATIN PO, Take 1 tablet by mouth daily., Disp: , Rfl: ; VITAMIN D, CHOLECALCIFEROL, PO, Take 1 tablet by mouth daily., Disp: , Rfl:  Current facility-administered medications:0.9 % sodium chloride infusion, 500 mL, Intravenous, Continuous, Louis Meckel, MD  No Known Allergies  BP 146/80  Pulse 70  Temp(Src) 98.1 F (36.7 C) (Temporal)  Resp 18  Ht 6\' 3"  (1.905 m)  Wt 168 lb 9.6 oz (76.476 kg)  BMI 21.07 kg/m2  Review of Systems  Constitutional: Positive for appetite change and unexpected weight change. Negative for fever, chills and diaphoresis.  5-10lbs weight loss. Regaining it back  HENT: Negative for nosebleeds, sore throat, facial swelling, mouth sores, trouble swallowing and ear discharge.  Eyes: Negative for photophobia, discharge and visual disturbance.  Respiratory: Negative for choking, chest tightness, shortness of breath and stridor.  Cardiovascular: Negative for chest pain and palpitations.  Patient walks 60 minutes for about 2-3 miles without difficulty. No exertional chest/neck/shoulder/arm pain.  Gastrointestinal: Negative for nausea, vomiting, abdominal pain, diarrhea, constipation, blood in stool, abdominal distention, anal bleeding and rectal pain.  Genitourinary: Negative for dysuria, urgency, difficulty urinating and testicular pain.  Musculoskeletal: Negative for myalgias, back pain, arthralgias and  gait problem.  Skin: Negative for color change, pallor, rash and wound.  Neurological: Negative for dizziness, speech difficulty, weakness, numbness and headaches.  Hematological: Negative for adenopathy. Does not bruise/bleed easily.  Psychiatric/Behavioral: Negative for hallucinations, confusion and agitation.  Objective:    Physical Exam  Constitutional: He is oriented to person, place, and time. He appears well-developed and well-nourished. No distress.  HENT:  Head: Normocephalic.  Mouth/Throat: Oropharynx is clear and moist. No oropharyngeal exudate.  Eyes: Conjunctivae and EOM are normal. Pupils are equal, round, and reactive to light. No scleral icterus.  Neck: Normal range of motion. Neck supple. No tracheal deviation present.  Cardiovascular: Normal rate, regular rhythm and intact distal pulses.  Pulmonary/Chest: Effort normal and breath sounds normal. No respiratory distress.  Port-A-Cath incision in infraclavicular pocket on left side clean with no evidence of infection per Abdominal: Soft. Incisions well healed.   He exhibits no distension. There is no tenderness. There is no rebound and no guarding. Hernia confirmed negative in the right inguinal area and confirmed negative in the left inguinal area.   Genitourinary: Penis normal. No penile tenderness.  Musculoskeletal: Normal range of motion. He exhibits no tenderness.  Lymphadenopathy:  He has no cervical adenopathy.  Right: No inguinal adenopathy present.  Left: No inguinal adenopathy present.  Neurological: He is alert and oriented to person, place, and time. No cranial nerve deficit. He exhibits normal muscle tone. Coordination normal.  Skin: Skin is warm and dry. No rash  noted. He is not diaphoretic. No erythema. No pallor.  Psychiatric: He has a normal mood and affect. His behavior is normal. Judgment and thought content normal.  Assessment:   Large cancer of ascending colon, need for chemoTx.  Poorly functioning  Port-a-cath Plan:   I suspect the catheter just needs to be turned back and repositioned a little bit.  Fluoroscopy available just in case.  Patient initially was anxious about having it done and refused to pay for any of it.  He seems more reassured now We'll try and coordinate at a convenient time.  Use of a central venous catheter for intravenous therapy was discussed.  Technique of catheter placement using ultrasound and fluoroscopy guidance was discussed.  Risks such as bleeding, infection, pneumothorax, catheter occlusion, reoperation, and other risks were discussed.   I noted a good likelihood this will help address the problem.  Questions were answered.  The patient expressed understanding & wishes to proceed.

## 2011-07-12 NOTE — Anesthesia Postprocedure Evaluation (Signed)
  Anesthesia Post-op Note  Patient: Greg Robertson  Procedure(s) Performed: Procedure(s) (LRB): PORT A CATH REVISION (Left)  Patient Location: PACU  Anesthesia Type: MAC  Level of Consciousness: awake and alert   Airway and Oxygen Therapy: Patient Spontanous Breathing  Post-op Pain: none  Post-op Assessment: Post-op Vital signs reviewed, Patient's Cardiovascular Status Stable, Respiratory Function Stable, Patent Airway and No signs of Nausea or vomiting  Post-op Vital Signs: Reviewed and stable  Complications: No apparent anesthesia complications

## 2011-07-13 ENCOUNTER — Telehealth: Payer: Self-pay | Admitting: Oncology

## 2011-07-13 ENCOUNTER — Encounter (HOSPITAL_COMMUNITY): Payer: Self-pay | Admitting: Surgery

## 2011-07-13 ENCOUNTER — Other Ambulatory Visit: Payer: Self-pay | Admitting: *Deleted

## 2011-07-13 NOTE — Telephone Encounter (Signed)
called pts home and the line was busy called pts brother donnie and asked that he inform the pt for appt for 03/28

## 2011-07-14 ENCOUNTER — Ambulatory Visit (HOSPITAL_BASED_OUTPATIENT_CLINIC_OR_DEPARTMENT_OTHER): Payer: BC Managed Care – PPO

## 2011-07-14 ENCOUNTER — Other Ambulatory Visit: Payer: Self-pay | Admitting: Oncology

## 2011-07-14 VITALS — BP 144/87 | HR 70 | Temp 98.7°F

## 2011-07-14 DIAGNOSIS — Z5111 Encounter for antineoplastic chemotherapy: Secondary | ICD-10-CM

## 2011-07-14 DIAGNOSIS — C182 Malignant neoplasm of ascending colon: Secondary | ICD-10-CM

## 2011-07-14 MED ORDER — SODIUM CHLORIDE 0.9 % IJ SOLN
10.0000 mL | INTRAMUSCULAR | Status: DC | PRN
  Filled 2011-07-14: qty 10

## 2011-07-14 MED ORDER — DEXTROSE 5 % IV SOLN
Freq: Once | INTRAVENOUS | Status: AC
  Administered 2011-07-14: 13:00:00 via INTRAVENOUS

## 2011-07-14 MED ORDER — DEXTROSE 5 % IV SOLN
85.0000 mg/m2 | Freq: Once | INTRAVENOUS | Status: AC
  Administered 2011-07-14: 165 mg via INTRAVENOUS
  Filled 2011-07-14: qty 33

## 2011-07-14 MED ORDER — DEXAMETHASONE SODIUM PHOSPHATE 10 MG/ML IJ SOLN
10.0000 mg | Freq: Once | INTRAMUSCULAR | Status: AC
  Administered 2011-07-14: 10 mg via INTRAVENOUS

## 2011-07-14 MED ORDER — ONDANSETRON 8 MG/50ML IVPB (CHCC)
8.0000 mg | Freq: Once | INTRAVENOUS | Status: AC
  Administered 2011-07-14: 8 mg via INTRAVENOUS

## 2011-07-14 MED ORDER — LEUCOVORIN CALCIUM INJECTION 350 MG
400.0000 mg/m2 | Freq: Once | INTRAVENOUS | Status: AC
  Administered 2011-07-14: 788 mg via INTRAVENOUS
  Filled 2011-07-14: qty 39.4

## 2011-07-14 MED ORDER — SODIUM CHLORIDE 0.9 % IV SOLN
2400.0000 mg/m2 | INTRAVENOUS | Status: DC
  Administered 2011-07-14: 4750 mg via INTRAVENOUS
  Filled 2011-07-14: qty 95

## 2011-07-14 MED ORDER — HEPARIN SOD (PORK) LOCK FLUSH 100 UNIT/ML IV SOLN
500.0000 [IU] | Freq: Once | INTRAVENOUS | Status: DC | PRN
  Filled 2011-07-14: qty 5

## 2011-07-14 MED ORDER — FLUOROURACIL CHEMO INJECTION 2.5 GM/50ML
400.0000 mg/m2 | Freq: Once | INTRAVENOUS | Status: AC
  Administered 2011-07-14: 800 mg via INTRAVENOUS
  Filled 2011-07-14: qty 16

## 2011-07-15 ENCOUNTER — Encounter (HOSPITAL_COMMUNITY): Payer: Self-pay

## 2011-07-16 ENCOUNTER — Ambulatory Visit (HOSPITAL_BASED_OUTPATIENT_CLINIC_OR_DEPARTMENT_OTHER): Payer: BC Managed Care – PPO

## 2011-07-16 VITALS — BP 132/84 | HR 81 | Temp 98.3°F

## 2011-07-16 DIAGNOSIS — C182 Malignant neoplasm of ascending colon: Secondary | ICD-10-CM

## 2011-07-16 DIAGNOSIS — Z452 Encounter for adjustment and management of vascular access device: Secondary | ICD-10-CM

## 2011-07-16 MED ORDER — HEPARIN SOD (PORK) LOCK FLUSH 100 UNIT/ML IV SOLN
500.0000 [IU] | Freq: Once | INTRAVENOUS | Status: AC | PRN
  Administered 2011-07-16: 500 [IU]
  Filled 2011-07-16: qty 5

## 2011-07-16 MED ORDER — SODIUM CHLORIDE 0.9 % IJ SOLN
10.0000 mL | INTRAMUSCULAR | Status: DC | PRN
  Administered 2011-07-16: 10 mL
  Filled 2011-07-16: qty 10

## 2011-07-21 ENCOUNTER — Inpatient Hospital Stay: Payer: BC Managed Care – PPO

## 2011-07-21 ENCOUNTER — Ambulatory Visit: Payer: BC Managed Care – PPO | Admitting: Nurse Practitioner

## 2011-07-25 ENCOUNTER — Telehealth: Payer: Self-pay | Admitting: Genetic Counselor

## 2011-07-25 NOTE — Telephone Encounter (Signed)
Left name and phone number with patient's father and asked that he call me back.

## 2011-07-27 ENCOUNTER — Other Ambulatory Visit: Payer: Self-pay | Admitting: Oncology

## 2011-07-28 ENCOUNTER — Ambulatory Visit: Payer: BC Managed Care – PPO | Admitting: Oncology

## 2011-07-28 ENCOUNTER — Other Ambulatory Visit: Payer: BC Managed Care – PPO | Admitting: Lab

## 2011-07-28 ENCOUNTER — Ambulatory Visit (HOSPITAL_BASED_OUTPATIENT_CLINIC_OR_DEPARTMENT_OTHER): Payer: BC Managed Care – PPO | Admitting: Genetic Counselor

## 2011-07-28 ENCOUNTER — Ambulatory Visit (HOSPITAL_BASED_OUTPATIENT_CLINIC_OR_DEPARTMENT_OTHER): Payer: BC Managed Care – PPO

## 2011-07-28 VITALS — BP 138/76 | HR 95 | Temp 99.3°F | Ht 75.5 in | Wt 167.1 lb

## 2011-07-28 DIAGNOSIS — C182 Malignant neoplasm of ascending colon: Secondary | ICD-10-CM

## 2011-07-28 DIAGNOSIS — Z800 Family history of malignant neoplasm of digestive organs: Secondary | ICD-10-CM

## 2011-07-28 DIAGNOSIS — Z1509 Genetic susceptibility to other malignant neoplasm: Secondary | ICD-10-CM | POA: Insufficient documentation

## 2011-07-28 DIAGNOSIS — Z5111 Encounter for antineoplastic chemotherapy: Secondary | ICD-10-CM

## 2011-07-28 LAB — CBC WITH DIFFERENTIAL/PLATELET
Basophils Absolute: 0 10*3/uL (ref 0.0–0.1)
Eosinophils Absolute: 0.1 10*3/uL (ref 0.0–0.5)
HCT: 36.2 % — ABNORMAL LOW (ref 38.4–49.9)
HGB: 11.7 g/dL — ABNORMAL LOW (ref 13.0–17.1)
LYMPH%: 39.1 % (ref 14.0–49.0)
MCV: 82.1 fL (ref 79.3–98.0)
MONO#: 0.6 10*3/uL (ref 0.1–0.9)
MONO%: 14.6 % — ABNORMAL HIGH (ref 0.0–14.0)
NEUT#: 1.6 10*3/uL (ref 1.5–6.5)
NEUT%: 42.2 % (ref 39.0–75.0)
Platelets: 99 10*3/uL — ABNORMAL LOW (ref 140–400)

## 2011-07-28 LAB — COMPREHENSIVE METABOLIC PANEL
CO2: 27 mEq/L (ref 19–32)
Calcium: 9.1 mg/dL (ref 8.4–10.5)
Chloride: 105 mEq/L (ref 96–112)
Creatinine, Ser: 1.02 mg/dL (ref 0.50–1.35)
Glucose, Bld: 149 mg/dL — ABNORMAL HIGH (ref 70–99)
Total Bilirubin: 0.2 mg/dL — ABNORMAL LOW (ref 0.3–1.2)

## 2011-07-28 MED ORDER — SODIUM CHLORIDE 0.9 % IV SOLN
2400.0000 mg/m2 | INTRAVENOUS | Status: DC
  Administered 2011-07-28: 4750 mg via INTRAVENOUS
  Filled 2011-07-28: qty 95

## 2011-07-28 MED ORDER — DEXAMETHASONE SODIUM PHOSPHATE 10 MG/ML IJ SOLN
10.0000 mg | Freq: Once | INTRAMUSCULAR | Status: AC
  Administered 2011-07-28: 10 mg via INTRAVENOUS

## 2011-07-28 MED ORDER — OXALIPLATIN CHEMO INJECTION 100 MG/20ML
85.0000 mg/m2 | Freq: Once | INTRAVENOUS | Status: AC
  Administered 2011-07-28: 165 mg via INTRAVENOUS
  Filled 2011-07-28: qty 33

## 2011-07-28 MED ORDER — FLUOROURACIL CHEMO INJECTION 2.5 GM/50ML
400.0000 mg/m2 | Freq: Once | INTRAVENOUS | Status: AC
  Administered 2011-07-28: 800 mg via INTRAVENOUS
  Filled 2011-07-28: qty 16

## 2011-07-28 MED ORDER — ONDANSETRON 8 MG/50ML IVPB (CHCC)
8.0000 mg | Freq: Once | INTRAVENOUS | Status: AC
  Administered 2011-07-28: 8 mg via INTRAVENOUS

## 2011-07-28 MED ORDER — LEUCOVORIN CALCIUM INJECTION 350 MG
400.0000 mg/m2 | Freq: Once | INTRAVENOUS | Status: AC
  Administered 2011-07-28: 788 mg via INTRAVENOUS
  Filled 2011-07-28: qty 39.4

## 2011-07-28 MED ORDER — DEXTROSE 5 % IV SOLN
Freq: Once | INTRAVENOUS | Status: AC
  Administered 2011-07-28: 10:00:00 via INTRAVENOUS

## 2011-07-28 NOTE — Patient Instructions (Addendum)
[facility] Discharge Instructions for Patients Receiving Chemotherapy  Today you received the following chemotherapy agents Oxaliplatin, 5FU, Leucovorin  To help prevent nausea and vomiting after your treatment, we encourage you to take your nausea medication as prescribed Begin taking it as needed and take it as often as prescribed for the next 2-3 days.   If you develop nausea and vomiting that is not controlled by your nausea medication, call the clinic. If it is after clinic hours your family physician or the after hours number for the clinic or go to the Emergency Department.   BELOW ARE SYMPTOMS THAT SHOULD BE REPORTED IMMEDIATELY:  *FEVER GREATER THAN 100.5 F  *CHILLS WITH OR WITHOUT FEVER  NAUSEA AND VOMITING THAT IS NOT CONTROLLED WITH YOUR NAUSEA MEDICATION  *UNUSUAL SHORTNESS OF BREATH  *UNUSUAL BRUISING OR BLEEDING  TENDERNESS IN MOUTH AND THROAT WITH OR WITHOUT PRESENCE OF ULCERS  *URINARY PROBLEMS  *BOWEL PROBLEMS  UNUSUAL RASH Items with * indicate a potential emergency and should be followed up as soon as possible.  One of the nurses will contact you 24 hours after your treatment. Please let the nurse know about any problems that you may have experienced. Feel free to call the clinic you have any questions or concerns. The clinic phone number is [phone].   I have been informed and understand all the instructions given to me. I know to contact the clinic, my physician, or go to the Emergency Department if any problems should occur. I do not have any questions at this time, but understand that I may call the clinic during office hours   should I have any questions or need assistance in obtaining follow up care.    __________________________________________  _____________  __________ Signature of Patient or Authorized Representative            Date                   Time    __________________________________________ Nurse's  Signature   Pump d/c at 10:50 Saturday

## 2011-07-28 NOTE — Progress Notes (Signed)
OFFICE PROGRESS NOTE   INTERVAL HISTORY:   He completed a first cycle of FOLFOX 07/14/2011. He reports tolerating the chemotherapy well. He had altered sensation at the fingers for a few days following chemotherapy. This has resolved. He denies mouth sores, nausea, diarrhea, and hand/foot pain.  Objective:  Vital signs in last 24 hours:  Blood pressure 138/76, pulse 95, temperature 99.3 F (37.4 C), temperature source Oral, height 6' 3.5" (1.918 m), weight 167 lb 1.6 oz (75.796 kg).    HEENT: No thrush or ulcers Resp: Lungs clear bilaterally Cardio: Regular rate and rhythm GI: No hepatomegaly, healed surgical incision, nontender Vascular: No leg edema    Portacath/PICC-without erythema  Lab Results:  Lab Results  Component Value Date   WBC 3.8* 07/28/2011   HGB 11.7* 07/28/2011   HCT 36.2* 07/28/2011   MCV 82.1 07/28/2011   PLT 99* 07/28/2011   ANC 1.6    Medications: I have reviewed the patient's current medications.  Assessment/Plan: 1. 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 2. Microcytic anemia-likely secondary to iron deficiency  3. Insulin-dependent diabetes  4. Glaucoma  5. 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 Status post Port-A-Cath placement 07/01/2011  7. Neutropenia/thrombocytopenia secondary to chemotherapy-he knows to contact us for signs/symptoms of infection or bleeding.  Disposition: He tolerated the first cycle of adjuvant therapy with out significant acute toxicity. The plan is to proceed with cycle 2 today. He will return for an office visit and chemotherapy in 2 weeks.   Lucile Shutters,  MD  07/28/2011  9:29 AM

## 2011-07-28 NOTE — Progress Notes (Signed)
Markus Daft, "Edilia Bo", a 61 y.o. male, comes back to clinic to discuss his recent diagnosis of Lynch syndrome, and to further clarify his risks, as well as his family members' risks for cancer.   HISTORY OF PRESENT ILLNESS: Mr. Holdren was diagnosed with colon cancer earlier this year.  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  Past Medical History  Diagnosis Date  . Diabetes mellitus   . Glaucoma   . Allergic rhinitis   . Prostate nodule   . Colonic mass   . Cancer   . Substance abuse   . Cancer of ascending colon, 7cm 05/25/2011  . Hypertension   . Pneumonia 1979 or 1980  . Anemia     Past Surgical History  Procedure Date  . Finger surgery 2009    right middle  . Nasal fracture surgery 1968  . Tonsillectomy 1957 - approximate  . 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 Portacath placement 07/01/2011    Procedure: INSERTION PORT-A-CATH;  Surgeon: Ardeth Sportsman, MD;  Location: [facility] ORS;  Service: General;  Laterality: Left;  Insertion of Port-A-Catheter Left Internal Jugular    History  Substance Use Topics  . Smoking status: Former Smoker -- 1.0 packs/day for 15 years    Types: Cigarettes    Quit date: 04/18/1993  . Smokeless tobacco: Never Used   Comment: marijuana every night   . 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  There is a 50% risk to each of his siblings and therefore they should be tested for this mutation.  His family history does not point [place] in a specific direction on whether it came from his mother of father's side of the family.  De novo mutations, or new a new mutation in the patient, is thought to  be rare, and therefore we anticipate that it was inherited from one of his parents.  Typically breast cancer is not thought to be involved with Lynch syndrome, however, more recently some families it has been seen to be associated.  The patient's mother had breast cancer and was an only child.  His father is 11, was a former smoker and has lung cancer.  He has several nieces and nephews who could be at risk.  Therefore we could test the patient's father to see if he has this mutation, and then offer testing to the patient's cousins if this came from his side of the family.  All of the patient's siblings should be tested for the mutation found in him.  We reviewed risks for other cancers associated with Lynch syndrome, including a recurrence of colon cancer, small bowel cancer, urinary tract cancer, stomach cancer and pancreatic cancer, as well as screening recommendations.  We also reviewed lifestyle factors that can increase the risk for cancer (smoking and obesity) and decrease the risk for cancer (fruit and fiber, possible low dose aspirin).  We discussed that recent studies indicate that low dose aspirin can lower the overall risk for cancer in Lynch syndrome, although a specific dose has not been recommended.   The patient's PMS2 MMR mutation is suggestive  of the following possible diagnosis: Lynch Syndrome, or Hereditary Non-polyposis Colon Cancer (HNPCC)  The patient was seen for a total of 30 minutes, greater than 50% of which was spent face-to-face counseling.  This plan is being carried out per Dr. Kalman Drape recommendations.  This note will also be sent to the referring provider via the electronic medical record. The patient will be supplied with a summary of this genetic counseling discussion as well as educational information on the discussed hereditary cancer syndromes following the conclusion of their visit.   Patient was discussed with Dr. Drue Second.  EPIC CC: Dr. Arlyce Dice   CC BY [place]  MAIL: Dr. Vernon Prey, [facility], [phone] [address] [place], [place]  [address]    _______________________________________________________________________ For Office Staff:  Number of people involved in session: 2 Was an Intern/ student involved with case: not applicable

## 2011-07-29 ENCOUNTER — Telehealth: Payer: Self-pay | Admitting: Oncology

## 2011-07-29 NOTE — Telephone Encounter (Signed)
appts made and printed for pt to get 4/13  aom

## 2011-07-30 ENCOUNTER — Ambulatory Visit (HOSPITAL_BASED_OUTPATIENT_CLINIC_OR_DEPARTMENT_OTHER): Payer: BC Managed Care – PPO

## 2011-07-30 VITALS — BP 149/70 | HR 80 | Temp 97.6°F

## 2011-07-30 DIAGNOSIS — C182 Malignant neoplasm of ascending colon: Secondary | ICD-10-CM

## 2011-07-30 MED ORDER — SODIUM CHLORIDE 0.9 % IJ SOLN
10.0000 mL | INTRAMUSCULAR | Status: DC | PRN
  Administered 2011-07-30: 10 mL
  Filled 2011-07-30: qty 10

## 2011-07-30 MED ORDER — HEPARIN SOD (PORK) LOCK FLUSH 100 UNIT/ML IV SOLN
500.0000 [IU] | Freq: Once | INTRAVENOUS | Status: AC | PRN
  Administered 2011-07-30: 500 [IU]
  Filled 2011-07-30: qty 5

## 2011-08-01 ENCOUNTER — Encounter: Payer: Self-pay | Admitting: Genetic Counselor

## 2011-08-03 ENCOUNTER — Other Ambulatory Visit: Payer: Self-pay | Admitting: Oncology

## 2011-08-04 ENCOUNTER — Other Ambulatory Visit: Payer: BC Managed Care – PPO | Admitting: Lab

## 2011-08-04 ENCOUNTER — Ambulatory Visit: Payer: BC Managed Care – PPO | Admitting: Nurse Practitioner

## 2011-08-04 ENCOUNTER — Inpatient Hospital Stay: Payer: BC Managed Care – PPO

## 2011-08-10 ENCOUNTER — Other Ambulatory Visit: Payer: Self-pay | Admitting: Oncology

## 2011-08-11 ENCOUNTER — Ambulatory Visit (HOSPITAL_BASED_OUTPATIENT_CLINIC_OR_DEPARTMENT_OTHER): Payer: BC Managed Care – PPO | Admitting: Oncology

## 2011-08-11 ENCOUNTER — Other Ambulatory Visit (HOSPITAL_BASED_OUTPATIENT_CLINIC_OR_DEPARTMENT_OTHER): Payer: BC Managed Care – PPO | Admitting: Lab

## 2011-08-11 ENCOUNTER — Ambulatory Visit (HOSPITAL_BASED_OUTPATIENT_CLINIC_OR_DEPARTMENT_OTHER): Payer: BC Managed Care – PPO

## 2011-08-11 VITALS — BP 120/71 | HR 68 | Temp 97.4°F | Ht 75.5 in | Wt 164.2 lb

## 2011-08-11 DIAGNOSIS — Z5111 Encounter for antineoplastic chemotherapy: Secondary | ICD-10-CM

## 2011-08-11 DIAGNOSIS — D696 Thrombocytopenia, unspecified: Secondary | ICD-10-CM

## 2011-08-11 DIAGNOSIS — D509 Iron deficiency anemia, unspecified: Secondary | ICD-10-CM

## 2011-08-11 DIAGNOSIS — C182 Malignant neoplasm of ascending colon: Secondary | ICD-10-CM

## 2011-08-11 LAB — CBC WITH DIFFERENTIAL/PLATELET
BASO%: 0.5 % (ref 0.0–2.0)
Eosinophils Absolute: 0.1 10*3/uL (ref 0.0–0.5)
LYMPH%: 31.9 % (ref 14.0–49.0)
MCHC: 33.8 g/dL (ref 32.0–36.0)
MONO#: 0.4 10*3/uL (ref 0.1–0.9)
NEUT#: 2.1 10*3/uL (ref 1.5–6.5)
Platelets: 57 10*3/uL — ABNORMAL LOW (ref 140–400)
RBC: 4.51 10*6/uL (ref 4.20–5.82)
RDW: 17.8 % — ABNORMAL HIGH (ref 11.0–14.6)
WBC: 3.9 10*3/uL — ABNORMAL LOW (ref 4.0–10.3)
lymph#: 1.2 10*3/uL (ref 0.9–3.3)
nRBC: 0 % (ref 0–0)

## 2011-08-11 LAB — COMPREHENSIVE METABOLIC PANEL
ALT: 23 U/L (ref 0–53)
AST: 23 U/L (ref 0–37)
CO2: 30 mEq/L (ref 19–32)
Calcium: 9.5 mg/dL (ref 8.4–10.5)
Chloride: 104 mEq/L (ref 96–112)
Creatinine, Ser: 0.98 mg/dL (ref 0.50–1.35)
Sodium: 139 mEq/L (ref 135–145)
Total Protein: 6.8 g/dL (ref 6.0–8.3)

## 2011-08-11 MED ORDER — LEUCOVORIN CALCIUM INJECTION 350 MG
400.0000 mg/m2 | Freq: Once | INTRAVENOUS | Status: AC
  Administered 2011-08-11: 788 mg via INTRAVENOUS
  Filled 2011-08-11: qty 39.4

## 2011-08-11 MED ORDER — FLUOROURACIL CHEMO INJECTION 2.5 GM/50ML
400.0000 mg/m2 | Freq: Once | INTRAVENOUS | Status: AC
  Administered 2011-08-11: 800 mg via INTRAVENOUS
  Filled 2011-08-11: qty 16

## 2011-08-11 MED ORDER — DEXTROSE 5 % IV SOLN: Freq: Once | INTRAVENOUS | Status: DC

## 2011-08-11 MED ORDER — SODIUM CHLORIDE 0.9 % IV SOLN
2400.0000 mg/m2 | INTRAVENOUS | Status: DC
  Administered 2011-08-11: 4750 mg via INTRAVENOUS
  Filled 2011-08-11: qty 95

## 2011-08-11 NOTE — Progress Notes (Signed)
OFFICE PROGRESS NOTE   INTERVAL HISTORY:   He returns as scheduled. He completed another cycle of chemotherapy on 07/28/2011. He denies nausea, mouth sores, and diarrhea following chemotherapy. He feels well. He noted a mild burning sensation in the mouth and loss of taste for a few days following chemotherapy. He had mild tingling in the fingers for several days following chemotherapy. This has resolved. He noted "red spots "on the thighs 1-2 days after chemotherapy. No associated symptoms. These spontaneously resolved.  Objective:  Vital signs in last 24 hours:  Blood pressure 120/71, pulse 68, temperature 97.4 F (36.3 C), temperature source Oral, height 6' 3.5" (1.918 m), weight 164 lb 3.2 oz (74.481 kg).    HEENT: No thrush or ulcers Resp: Lungs clear bilaterally Cardio: Regular rate and rhythm GI: No hepatomegaly, nontender Vascular: No leg edema  Skin: No rash, palms without erythema   Portacath/PICC-there is mild erythema surrounding the Port-A-Cath. No tenderness or fluctuance.  Lab Results:  Lab Results  Component Value Date   WBC 3.9* 08/11/2011   HGB 12.2* 08/11/2011   HCT 36.1* 08/11/2011   MCV 80.0 08/11/2011   PLT 57* 08/11/2011   ANC 2.1   Medications: I have reviewed the patient's current medications.  Assessment/Plan: 1. 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  2. Microcytic anemia-likely secondary to iron deficiency  3. Insulin-dependent diabetes  4. Glaucoma  5. 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 Status post Port-A-Cath placement 07/01/2011  7. Neutropenia/thrombocytopenia  secondary to chemotherapy-he knows to contact us for signs/symptoms of infection or bleeding.   Disposition:  The platelet count is lower today. This is very likely due to the oxaliplatin. We decided to proceed with 5-FU/leucovorin chemotherapy. Oxaliplatin will be deleted from this cycle. He knows to contact us for bruising or bleeding. He will return for an office visit and chemotherapy in 2 weeks.   Thornton Papas, MD  08/11/2011  9:44 AM

## 2011-08-12 ENCOUNTER — Telehealth: Payer: Self-pay | Admitting: *Deleted

## 2011-08-12 NOTE — Telephone Encounter (Signed)
Per staff message from [place], I have scheduled the patient's treatment.   Rose aware appts in computer.   JMW

## 2011-08-13 ENCOUNTER — Ambulatory Visit (HOSPITAL_BASED_OUTPATIENT_CLINIC_OR_DEPARTMENT_OTHER): Payer: BC Managed Care – PPO

## 2011-08-13 VITALS — BP 139/79 | HR 66 | Temp 97.0°F

## 2011-08-13 DIAGNOSIS — C182 Malignant neoplasm of ascending colon: Secondary | ICD-10-CM

## 2011-08-13 DIAGNOSIS — Z452 Encounter for adjustment and management of vascular access device: Secondary | ICD-10-CM

## 2011-08-13 MED ORDER — SODIUM CHLORIDE 0.9 % IJ SOLN
10.0000 mL | INTRAMUSCULAR | Status: DC | PRN
  Administered 2011-08-13: 10 mL
  Filled 2011-08-13: qty 10

## 2011-08-13 MED ORDER — HEPARIN SOD (PORK) LOCK FLUSH 100 UNIT/ML IV SOLN
500.0000 [IU] | Freq: Once | INTRAVENOUS | Status: AC | PRN
  Administered 2011-08-13: 500 [IU]
  Filled 2011-08-13: qty 5

## 2011-08-13 NOTE — Patient Instructions (Signed)
Call MD for problems 

## 2011-08-17 ENCOUNTER — Telehealth: Payer: Self-pay | Admitting: Oncology

## 2011-08-17 NOTE — Telephone Encounter (Signed)
Talked to pt, gave him appt date for May 2013 lab, ML/MD and chemo

## 2011-08-18 ENCOUNTER — Other Ambulatory Visit: Payer: BC Managed Care – PPO | Admitting: Lab

## 2011-08-18 ENCOUNTER — Ambulatory Visit: Payer: BC Managed Care – PPO | Admitting: Oncology

## 2011-08-18 ENCOUNTER — Inpatient Hospital Stay: Payer: BC Managed Care – PPO

## 2011-08-24 ENCOUNTER — Other Ambulatory Visit: Payer: Self-pay | Admitting: Oncology

## 2011-08-25 ENCOUNTER — Ambulatory Visit (HOSPITAL_BASED_OUTPATIENT_CLINIC_OR_DEPARTMENT_OTHER): Payer: BC Managed Care – PPO | Admitting: Nurse Practitioner

## 2011-08-25 ENCOUNTER — Other Ambulatory Visit (HOSPITAL_BASED_OUTPATIENT_CLINIC_OR_DEPARTMENT_OTHER): Payer: BC Managed Care – PPO | Admitting: Lab

## 2011-08-25 ENCOUNTER — Telehealth: Payer: Self-pay | Admitting: Oncology

## 2011-08-25 ENCOUNTER — Ambulatory Visit (HOSPITAL_BASED_OUTPATIENT_CLINIC_OR_DEPARTMENT_OTHER): Payer: BC Managed Care – PPO

## 2011-08-25 ENCOUNTER — Other Ambulatory Visit: Payer: Self-pay | Admitting: Oncology

## 2011-08-25 VITALS — BP 105/59 | HR 91 | Temp 97.6°F | Ht 75.5 in | Wt 160.9 lb

## 2011-08-25 DIAGNOSIS — C182 Malignant neoplasm of ascending colon: Secondary | ICD-10-CM

## 2011-08-25 DIAGNOSIS — D649 Anemia, unspecified: Secondary | ICD-10-CM

## 2011-08-25 DIAGNOSIS — Z5111 Encounter for antineoplastic chemotherapy: Secondary | ICD-10-CM

## 2011-08-25 DIAGNOSIS — E119 Type 2 diabetes mellitus without complications: Secondary | ICD-10-CM

## 2011-08-25 DIAGNOSIS — D696 Thrombocytopenia, unspecified: Secondary | ICD-10-CM

## 2011-08-25 LAB — CBC WITH DIFFERENTIAL/PLATELET
Eosinophils Absolute: 0 10*3/uL (ref 0.0–0.5)
HCT: 38.1 % — ABNORMAL LOW (ref 38.4–49.9)
LYMPH%: 31.7 % (ref 14.0–49.0)
MCHC: 34.6 g/dL (ref 32.0–36.0)
MCV: 81.4 fL (ref 79.3–98.0)
MONO#: 0.7 10*3/uL (ref 0.1–0.9)
MONO%: 12.9 % (ref 0.0–14.0)
NEUT#: 2.7 10*3/uL (ref 1.5–6.5)
NEUT%: 54.6 % (ref 39.0–75.0)
Platelets: 82 10*3/uL — ABNORMAL LOW (ref 140–400)
WBC: 5 10*3/uL (ref 4.0–10.3)

## 2011-08-25 LAB — COMPREHENSIVE METABOLIC PANEL
CO2: 24 mEq/L (ref 19–32)
Creatinine, Ser: 1.07 mg/dL (ref 0.50–1.35)
Glucose, Bld: 613 mg/dL (ref 70–99)
Total Bilirubin: 0.4 mg/dL (ref 0.3–1.2)

## 2011-08-25 MED ORDER — SODIUM CHLORIDE 0.9 % IV SOLN
2400.0000 mg/m2 | INTRAVENOUS | Status: DC
  Administered 2011-08-25: 4750 mg via INTRAVENOUS
  Filled 2011-08-25: qty 95

## 2011-08-25 MED ORDER — OXALIPLATIN CHEMO INJECTION 100 MG/20ML
65.0000 mg/m2 | Freq: Once | INTRAVENOUS | Status: AC
  Administered 2011-08-25: 130 mg via INTRAVENOUS
  Filled 2011-08-25: qty 26

## 2011-08-25 MED ORDER — DEXAMETHASONE SODIUM PHOSPHATE 10 MG/ML IJ SOLN
10.0000 mg | Freq: Once | INTRAMUSCULAR | Status: AC
Start: 2011-08-25 — End: 2011-08-25
  Administered 2011-08-25: 10 mg via INTRAVENOUS

## 2011-08-25 MED ORDER — FLUOROURACIL CHEMO INJECTION 2.5 GM/50ML
400.0000 mg/m2 | Freq: Once | INTRAVENOUS | Status: AC
  Administered 2011-08-25: 800 mg via INTRAVENOUS
  Filled 2011-08-25: qty 16

## 2011-08-25 MED ORDER — DEXTROSE 5 % IV SOLN
Freq: Once | INTRAVENOUS | Status: AC
  Administered 2011-08-25: 10:00:00 via INTRAVENOUS

## 2011-08-25 MED ORDER — LEUCOVORIN CALCIUM INJECTION 350 MG
400.0000 mg/m2 | Freq: Once | INTRAVENOUS | Status: AC
  Administered 2011-08-25: 788 mg via INTRAVENOUS
  Filled 2011-08-25: qty 39.4

## 2011-08-25 MED ORDER — ONDANSETRON 8 MG/50ML IVPB (CHCC)
8.0000 mg | Freq: Once | INTRAVENOUS | Status: AC
  Administered 2011-08-25: 8 mg via INTRAVENOUS

## 2011-08-25 NOTE — Telephone Encounter (Signed)
Gave pt appt calendar for may and June 2013 lab, MD nutrition consult  and chemo, emailed Marcelino Duster regarding chemo on 09/22/11

## 2011-08-25 NOTE — Progress Notes (Signed)
OFFICE PROGRESS NOTE  Interval history:  Mr. Matranga returns as scheduled. He completed cycle 3 FOLFOX on 08/11/2011. Oxaliplatin was held due to thrombocytopenia. He denies mouth sores. No nausea or vomiting. No diarrhea. He noted cold sensitivity after drinking orange juice yesterday. He denies numbness or tingling in his hands or feet. No bleeding.  He notes redness over the Port-A-Cath site after applying Emla cream.   Objective: Blood pressure 105/59, pulse 91, temperature 97.6 F (36.4 C), temperature source Oral, height 6' 3.5" (1.918 m), weight 160 lb 14.4 oz (72.984 kg).  Oropharynx is without thrush or ulceration. Lungs are clear. Regular cardiac rhythm. Skin overlying the port site is erythematous. Abdomen is soft and nontender. No hepatomegaly. Extremities are without edema. Calves are soft and nontender. Vibratory sense is intact over the fingertips per tuning fork exam.  Lab Results: Lab Results  Component Value Date   WBC 5.0 08/25/2011   HGB 13.2 08/25/2011   HCT 38.1* 08/25/2011   MCV 81.4 08/25/2011   PLT 82* 08/25/2011    Chemistry:    Chemistry      Component Value Date/Time   NA 139 08/11/2011 0808   K 4.1 08/11/2011 0808   CL 104 08/11/2011 0808   CO2 30 08/11/2011 0808   BUN 18 08/11/2011 0808   CREATININE 0.98 08/11/2011 0808      Component Value Date/Time   CALCIUM 9.5 08/11/2011 0808   ALKPHOS 85 08/11/2011 0808   AST 23 08/11/2011 0808   ALT 23 08/11/2011 0808   BILITOT 0.3 08/11/2011 0808       Studies/Results: No results found.  Medications: I have reviewed the patient's current medications.  Assessment/Plan:  1. 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 Tumor microsatellite unstable, K-ras wild type. Adjuvant FOLFOX chemotherapy initiated on 07/14/2011. 2. Microcytic anemia. Likely secondary to iron deficiency. 3. Insulin-dependent diabetes. 4. Glaucoma. 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 He has seen a Runner, broadcasting/film/video. 6. Status post Port-A-Cath placement 07/01/2011. 7. Thrombocytopenia secondary to chemotherapy. Oxaliplatin was held with cycle 3.  8. 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   Disposition-Mr. Woodcox appears stable. The platelet count is better but still decreased. Plan to proceed with cycle 4 FOLFOX today as scheduled. Oxaliplatin will be resumed at a 25% dose reduction. He will return for a followup visit and cycle 5 FOLFOX in 2 weeks. He will contact the office in the interim with any problems. We specifically discussed bruising/bleeding.  Plan reviewed with Dr. Truett Perna.  Lonna Cobb ANP/GNP-BC

## 2011-08-27 ENCOUNTER — Ambulatory Visit (HOSPITAL_BASED_OUTPATIENT_CLINIC_OR_DEPARTMENT_OTHER): Payer: BC Managed Care – PPO

## 2011-08-27 VITALS — BP 134/72 | HR 85 | Temp 99.0°F

## 2011-08-27 DIAGNOSIS — C182 Malignant neoplasm of ascending colon: Secondary | ICD-10-CM

## 2011-08-27 DIAGNOSIS — Z452 Encounter for adjustment and management of vascular access device: Secondary | ICD-10-CM

## 2011-08-27 MED ORDER — SODIUM CHLORIDE 0.9 % IJ SOLN
10.0000 mL | INTRAMUSCULAR | Status: DC | PRN
Start: 2011-08-27 — End: 2011-08-27
  Administered 2011-08-27: 10 mL
  Filled 2011-08-27: qty 10

## 2011-08-27 MED ORDER — HEPARIN SOD (PORK) LOCK FLUSH 100 UNIT/ML IV SOLN
500.0000 [IU] | Freq: Once | INTRAVENOUS | Status: AC | PRN
  Administered 2011-08-27: 500 [IU]
  Filled 2011-08-27: qty 5

## 2011-08-29 ENCOUNTER — Telehealth: Payer: Self-pay | Admitting: Oncology

## 2011-08-29 NOTE — Telephone Encounter (Signed)
Called [facility] Dr. Truett Perna nurse, regarding chemo on 6/6, pt needs to be seen early due to Survivor day.

## 2011-08-31 ENCOUNTER — Telehealth: Payer: Self-pay | Admitting: Oncology

## 2011-08-31 NOTE — Telephone Encounter (Signed)
Talked to pt, gave him appt date for may and June2013

## 2011-09-07 ENCOUNTER — Other Ambulatory Visit: Payer: Self-pay | Admitting: Oncology

## 2011-09-08 ENCOUNTER — Ambulatory Visit (HOSPITAL_BASED_OUTPATIENT_CLINIC_OR_DEPARTMENT_OTHER): Payer: BC Managed Care – PPO | Admitting: Nurse Practitioner

## 2011-09-08 ENCOUNTER — Other Ambulatory Visit (HOSPITAL_BASED_OUTPATIENT_CLINIC_OR_DEPARTMENT_OTHER): Payer: BC Managed Care – PPO | Admitting: Lab

## 2011-09-08 ENCOUNTER — Ambulatory Visit (HOSPITAL_BASED_OUTPATIENT_CLINIC_OR_DEPARTMENT_OTHER): Payer: BC Managed Care – PPO

## 2011-09-08 ENCOUNTER — Telehealth: Payer: Self-pay | Admitting: Oncology

## 2011-09-08 ENCOUNTER — Ambulatory Visit: Payer: BC Managed Care – PPO | Admitting: Nutrition

## 2011-09-08 VITALS — BP 151/84 | HR 77 | Temp 98.5°F | Ht 75.5 in | Wt 160.5 lb

## 2011-09-08 DIAGNOSIS — D6959 Other secondary thrombocytopenia: Secondary | ICD-10-CM

## 2011-09-08 DIAGNOSIS — D649 Anemia, unspecified: Secondary | ICD-10-CM

## 2011-09-08 DIAGNOSIS — C182 Malignant neoplasm of ascending colon: Secondary | ICD-10-CM

## 2011-09-08 DIAGNOSIS — Z5111 Encounter for antineoplastic chemotherapy: Secondary | ICD-10-CM

## 2011-09-08 LAB — COMPREHENSIVE METABOLIC PANEL
Albumin: 3.7 g/dL (ref 3.5–5.2)
Alkaline Phosphatase: 107 U/L (ref 39–117)
Calcium: 9.4 mg/dL (ref 8.4–10.5)
Chloride: 98 mEq/L (ref 96–112)
Glucose, Bld: 283 mg/dL — ABNORMAL HIGH (ref 70–99)
Potassium: 3.9 mEq/L (ref 3.5–5.3)
Sodium: 136 mEq/L (ref 135–145)
Total Protein: 6.3 g/dL (ref 6.0–8.3)

## 2011-09-08 LAB — CBC WITH DIFFERENTIAL/PLATELET
Basophils Absolute: 0 10*3/uL (ref 0.0–0.1)
EOS%: 1.6 % (ref 0.0–7.0)
Eosinophils Absolute: 0.1 10*3/uL (ref 0.0–0.5)
HGB: 12 g/dL — ABNORMAL LOW (ref 13.0–17.1)
MCH: 28.8 pg (ref 27.2–33.4)
MONO#: 0.5 10*3/uL (ref 0.1–0.9)
NEUT#: 2.9 10*3/uL (ref 1.5–6.5)
RDW: 23 % — ABNORMAL HIGH (ref 11.0–14.6)
WBC: 4.5 10*3/uL (ref 4.0–10.3)
lymph#: 1 10*3/uL (ref 0.9–3.3)

## 2011-09-08 MED ORDER — DEXAMETHASONE SODIUM PHOSPHATE 10 MG/ML IJ SOLN
10.0000 mg | Freq: Once | INTRAMUSCULAR | Status: AC
  Administered 2011-09-08: 10 mg via INTRAVENOUS

## 2011-09-08 MED ORDER — LEUCOVORIN CALCIUM INJECTION 350 MG
400.0000 mg/m2 | Freq: Once | INTRAVENOUS | Status: AC
  Administered 2011-09-08: 788 mg via INTRAVENOUS
  Filled 2011-09-08: qty 39.4

## 2011-09-08 MED ORDER — OXALIPLATIN CHEMO INJECTION 100 MG/20ML
65.0000 mg/m2 | Freq: Once | INTRAVENOUS | Status: AC
  Administered 2011-09-08: 130 mg via INTRAVENOUS
  Filled 2011-09-08: qty 26

## 2011-09-08 MED ORDER — ONDANSETRON 8 MG/50ML IVPB (CHCC)
8.0000 mg | Freq: Once | INTRAVENOUS | Status: AC
  Administered 2011-09-08: 8 mg via INTRAVENOUS

## 2011-09-08 MED ORDER — SODIUM CHLORIDE 0.9 % IV SOLN
2400.0000 mg/m2 | INTRAVENOUS | Status: DC
  Administered 2011-09-08: 4750 mg via INTRAVENOUS
  Filled 2011-09-08: qty 95

## 2011-09-08 MED ORDER — DEXTROSE 5 % IV SOLN
Freq: Once | INTRAVENOUS | Status: AC
  Administered 2011-09-08: 10:00:00 via INTRAVENOUS

## 2011-09-08 MED ORDER — FLUOROURACIL CHEMO INJECTION 2.5 GM/50ML
400.0000 mg/m2 | Freq: Once | INTRAVENOUS | Status: AC
  Administered 2011-09-08: 800 mg via INTRAVENOUS
  Filled 2011-09-08: qty 16

## 2011-09-08 NOTE — Progress Notes (Signed)
Out-patient Oncology Nutrition Note  Greg Robertson is a 61 year old male patient of Dr. Truett Perna, diagnosed with colon cancer.   Past Medical History  Diagnosis Date  . Diabetes mellitus   . Glaucoma   . Allergic rhinitis   . Prostate nodule   . Colonic mass   . Cancer   . Substance abuse   . Cancer of ascending colon, 7cm 05/25/2011  . Hypertension   . Pneumonia 1979 or 1980  . Anemia    Current outpatient prescriptions:ACCU-CHEK AVIVA PLUS test strip, Daily., Disp: , Rfl: ;  acetaminophen (TYLENOL) 500 MG tablet, Take 1,000 mg by mouth every 6 (six) hours as needed. Pain , Disp: , Rfl: ;  Ascorbic Acid (VITAMIN C PO), Take 1 tablet by mouth every morning. , Disp: , Rfl: ;  Aspirin (ECOTRIN PO), Take 81 mg by mouth daily before breakfast. STOP ASA TODAY, Disp: , Rfl:  brimonidine-timolol (COMBIGAN) 0.2-0.5 % ophthalmic solution, Place 1 drop into both eyes every 12 (twelve) hours. , Disp: , Rfl: ;  ferrous sulfate 325 (65 FE) MG tablet, Take 1 tablet (325 mg total) by mouth 2 (two) times daily., Disp: 30 tablet, Rfl: 11;  insulin aspart (NOVOLOG) 100 UNIT/ML injection, Inject 1-15 Units into the skin 3 (three) times daily before meals. Take 1 unit for ever 6 grams of carbs before each meal, Disp: , Rfl:  insulin glargine (LANTUS) 100 UNIT/ML injection, Inject 22 Units into the skin at bedtime., Disp: , Rfl: ;  latanoprost (XALATAN) 0.005 % ophthalmic solution, Place 1 drop into both eyes at bedtime., Disp: , Rfl: ;  levocetirizine (XYZAL) 5 MG tablet, Take 5 mg by mouth daily before breakfast. , Disp: , Rfl: ;  lidocaine-prilocaine (EMLA) cream, Apply topically as needed., Disp: 30 g, Rfl: 1 LISINOPRIL PO, Take 5 mg by mouth daily before breakfast. , Disp: , Rfl: ;  Multiple Vitamin (MULTIVITAMIN) tablet, Take 1 tablet by mouth every morning. , Disp: , Rfl: ;  prochlorperazine (COMPAZINE) 10 MG tablet, Take 10 mg by mouth every 6 (six) hours as needed., Disp: , Rfl: ;  SIMVASTATIN PO, Take 10 mg  by mouth daily before breakfast. , Disp: , Rfl: ;  traZODone (DESYREL) 50 MG tablet, Take 50 mg by mouth At bedtime., Disp: , Rfl:  triamcinolone (NASACORT) 55 MCG/ACT nasal inhaler, Place 1 spray into the nose every morning. Allergies , Disp: , Rfl: ;  VITAMIN D, CHOLECALCIFEROL, PO, Take 2,000 Units by mouth every morning. Vitamin D 3, Disp: , Rfl:  No current facility-administered medications for this visit. Facility-Administered Medications Ordered in Other Visits: dexamethasone (DECADRON) injection 10 mg, 10 mg, Intravenous, Once, Ladene Artist, MD, 10 mg at 09/08/11 1007;  dextrose 5 % solution, , Intravenous, Once, Ladene Artist, MD, Last Rate: 20 mL/hr at 09/08/11 1005;  fluorouracil (ADRUCIL) 4,750 mg in sodium chloride 0.9 % 150 mL chemo infusion, 2,400 mg/m2 (Treatment Plan Actual), Intravenous, 1 day or 1 dose, Ladene Artist, MD fluorouracil (ADRUCIL) chemo injection 800 mg, 400 mg/m2 (Treatment Plan Actual), Intravenous, Once, Ladene Artist, MD;  leucovorin 788 mg in dextrose 5 % 250 mL infusion, 400 mg/m2 (Treatment Plan Actual), Intravenous, Once, Ladene Artist, MD, Last Rate: 145 mL/hr at 09/08/11 1044, 788 mg at 09/08/11 1044;  ondansetron (ZOFRAN) IVPB 8 mg, 8 mg, Intravenous, Once, Ladene Artist, MD, 8 mg at 09/08/11 1007 oxaliplatin (ELOXATIN) 130 mg in dextrose 5 % 500 mL chemo infusion, 65 mg/m2 (Treatment Plan Actual),  Intravenous, Once, Ladene Artist, MD, Last Rate: 263 mL/hr at 09/08/11 1044, 130 mg at 09/08/11 1044  Height: 6'3" Weight: 160.8 lb. BMI: 19.9 kg/m^2 (WNL)  Wt Readings from Last 10 Encounters:  09/08/11 160 lb 12.8 oz (72.938 kg)  09/08/11 160 lb 8 oz (72.802 kg)  08/25/11 160 lb 14.4 oz (72.984 kg)  08/11/11 164 lb 3.2 oz (74.481 kg)  07/28/11 167 lb 1.6 oz (75.796 kg)  07/08/11 167 lb (75.751 kg)  07/08/11 167 lb (75.751 kg)  07/07/11 167 lb 3.2 oz (75.841 kg)  07/01/11 161 lb 6 oz (73.199 kg)  07/01/11 161 lb 6 oz (73.199 kg)  UBW: 172 lb  per patient in mid February.  %UBW: 93.4%  *Unintentional 12 lb weight loss over 3 months, 6.9% from baseline.   PATIENT REPORTS: I spoke with [name] in the chemo treatment room. He reported to have a good appetite. He said he eats well, 3 meals a day, but can not keep his weight up. He denies any problems with nausea or vomiting. He stated he experiences changes in taste sometimes.   NUTRITION DIAGNOSIS Unintentional weight loss r/t diagnosis of cancer and related treatment symptoms a/e/b patient with unintentional 12 lb. Weight loss over 3 months.   INTERVENTION: We have discussed patient's increased needs for calories and protein to prevent further weight loss. I have encouraged the patient to eat 4 to 6 meals daily. We discussed high-calorie, high protein diet. He expressed he does not want to get additional calories and protein from supplements. He prefers to get calories and protein from foods. I have discussed and provided handouts for high calorie, high protein foods and shakes, taste alterations and managing nausea. The patient is without any further nutrition related questions at this time.   GOALS: 1. Promote weight maintenance.   2. Patient to eat 4 to 6 meals daily. 3. Patient to increase calorie and protein intake.   MONITOR:  No follow up scheduled. I provided patient RD contact information and encouraged patient to contact if weight loss continue or for further nutrition related questions/ concerns.   RD available for nutrition needs.  Iven Finn [facility] [phone]

## 2011-09-08 NOTE — Telephone Encounter (Signed)
Gave pt appt calendar for may, June and July 2013 lab, chemo, ML and MD

## 2011-09-08 NOTE — Patient Instructions (Signed)
[facility] Discharge Instructions for Patients Receiving Chemotherapy  Today you received the following chemotherapy agents FOLFOX To help prevent nausea and vomiting after your treatment, we encourage you to take your nausea medication as prescribed.   If you develop nausea and vomiting that is not controlled by your nausea medication, call the clinic. If it is after clinic hours your family physician or the after hours number for the clinic or go to the Emergency Department.   BELOW ARE SYMPTOMS THAT SHOULD BE REPORTED IMMEDIATELY:  *FEVER GREATER THAN 100.5 F  *CHILLS WITH OR WITHOUT FEVER  NAUSEA AND VOMITING THAT IS NOT CONTROLLED WITH YOUR NAUSEA MEDICATION  *UNUSUAL SHORTNESS OF BREATH  *UNUSUAL BRUISING OR BLEEDING  TENDERNESS IN MOUTH AND THROAT WITH OR WITHOUT PRESENCE OF ULCERS  *URINARY PROBLEMS  *BOWEL PROBLEMS  UNUSUAL RASH Items with * indicate a potential emergency and should be followed up as soon as possible.  One of the nurses will contact you 24 hours after your treatment. Please let the nurse know about any problems that you may have experienced. Feel free to call the clinic you have any questions or concerns. The clinic phone number is [phone].   I have been informed and understand all the instructions given to me. I know to contact the clinic, my physician, or go to the Emergency Department if any problems should occur. I do not have any questions at this time, but understand that I may call the clinic during office hours   should I have any questions or need assistance in obtaining follow up care.    __________________________________________  _____________  __________ Signature of Patient or Authorized Representative            Date                   Time    __________________________________________ Nurse's Signature

## 2011-09-08 NOTE — Progress Notes (Signed)
OFFICE PROGRESS NOTE  Interval history:  Greg Robertson returns as scheduled. He completed cycle 4 FOLFOX on 08/25/2011. Oxaliplatin was resumed with cycle 4 at a 25% dose reduction. He denies nausea/vomiting. No mouth sores. No diarrhea. Cold sensitivity lasting 5-6 days. He denies persistent neuropathy symptoms.   Objective: Blood pressure 151/84, pulse 77, temperature 98.5 F (36.9 C), temperature source Oral, height 6' 3.5" (1.918 m), weight 160 lb 8 oz (72.802 kg).  Oropharynx is without thrush or ulceration. Lungs are clear. Regular cardiac rhythm. Port-A-Cath site is without erythema. Abdomen is soft and nontender. No hepatomegaly. Extremities are without edema. Calves are soft and nontender. Vibratory sense is moderately decreased over the fingertips per tuning fork exam.  Lab Results: Lab Results  Component Value Date   WBC 4.5 09/08/2011   HGB 12.0* 09/08/2011   HCT 36.4* 09/08/2011   MCV 87.1 09/08/2011   PLT 82* 09/08/2011    Chemistry:    Chemistry      Component Value Date/Time   NA 129* 08/25/2011 0856   K 4.8 08/25/2011 0856   CL 93* 08/25/2011 0856   CO2 24 08/25/2011 0856   BUN 26* 08/25/2011 0856   CREATININE 1.07 08/25/2011 0856      Component Value Date/Time   CALCIUM 9.4 08/25/2011 0856   ALKPHOS 120* 08/25/2011 0856   AST 25 08/25/2011 0856   ALT 23 08/25/2011 0856   BILITOT 0.4 08/25/2011 0856       Studies/Results: No results found.  Medications: I have reviewed the patient's current medications.  Assessment/Plan:  1. 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 Tumor microsatellite unstable, K-ras wild type. Adjuvant FOLFOX chemotherapy initiated on 07/14/2011. 2. Microcytic anemia. Likely secondary to iron deficiency. 3. Insulin-dependent diabetes. 4. Glaucoma. 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 He has seen a Runner, broadcasting/film/video. 6. Status post Port-A-Cath placement 07/01/2011. 7. Thrombocytopenia secondary to chemotherapy. Oxaliplatin was held with cycle 3. Oxaliplatin was resumed with cycle 4 at a 25% dose reduction. 8. Question early oxaliplatin neuropathy with decreased vibratory sense over the fingertips per tuning fork exam.  Disposition-Mr. Shrode appears stable. He has stable thrombocytopenia. Plan to proceed with cycle 5 FOLFOX today as scheduled. He has decreased vibratory sense over the fingertips on exam today. The decreased vibratory sense may be due to early oxaliplatin neuropathy. We will continue to monitor closely. He will return for a followup visit and cycle 6 FOLFOX in 2 weeks. He will contact the office in the interim with any problems.  Plan reviewed with Dr. Truett Perna.  Lonna Cobb ANP/GNP-BC

## 2011-09-10 ENCOUNTER — Ambulatory Visit (HOSPITAL_BASED_OUTPATIENT_CLINIC_OR_DEPARTMENT_OTHER): Payer: BC Managed Care – PPO

## 2011-09-10 VITALS — BP 168/75 | HR 76 | Temp 98.8°F

## 2011-09-10 DIAGNOSIS — C182 Malignant neoplasm of ascending colon: Secondary | ICD-10-CM

## 2011-09-10 DIAGNOSIS — Z452 Encounter for adjustment and management of vascular access device: Secondary | ICD-10-CM

## 2011-09-10 MED ORDER — HEPARIN SOD (PORK) LOCK FLUSH 100 UNIT/ML IV SOLN
500.0000 [IU] | Freq: Once | INTRAVENOUS | Status: AC | PRN
  Administered 2011-09-10: 500 [IU]
  Filled 2011-09-10: qty 5

## 2011-09-10 MED ORDER — SODIUM CHLORIDE 0.9 % IJ SOLN
10.0000 mL | INTRAMUSCULAR | Status: DC | PRN
  Administered 2011-09-10: 10 mL
  Filled 2011-09-10: qty 10

## 2011-09-10 NOTE — Patient Instructions (Signed)
Pt in for pump d/c.  Pt without complaints.  Instructions given to call with questions and concerns.  Pt discharged home ambulatory.

## 2011-09-19 ENCOUNTER — Other Ambulatory Visit: Payer: Self-pay | Admitting: Oncology

## 2011-09-21 ENCOUNTER — Ambulatory Visit: Payer: BC Managed Care – PPO | Admitting: Oncology

## 2011-09-21 ENCOUNTER — Other Ambulatory Visit: Payer: BC Managed Care – PPO | Admitting: Lab

## 2011-09-21 ENCOUNTER — Inpatient Hospital Stay: Payer: BC Managed Care – PPO

## 2011-09-22 ENCOUNTER — Other Ambulatory Visit: Payer: BC Managed Care – PPO | Admitting: Lab

## 2011-09-22 ENCOUNTER — Other Ambulatory Visit (HOSPITAL_BASED_OUTPATIENT_CLINIC_OR_DEPARTMENT_OTHER): Payer: BC Managed Care – PPO | Admitting: Lab

## 2011-09-22 ENCOUNTER — Ambulatory Visit (HOSPITAL_BASED_OUTPATIENT_CLINIC_OR_DEPARTMENT_OTHER): Payer: BC Managed Care – PPO | Admitting: Nurse Practitioner

## 2011-09-22 ENCOUNTER — Telehealth: Payer: Self-pay | Admitting: *Deleted

## 2011-09-22 ENCOUNTER — Ambulatory Visit: Payer: BC Managed Care – PPO | Admitting: Oncology

## 2011-09-22 ENCOUNTER — Inpatient Hospital Stay: Payer: BC Managed Care – PPO

## 2011-09-22 VITALS — BP 120/72 | HR 75 | Temp 98.2°F | Ht 75.5 in | Wt 161.8 lb

## 2011-09-22 DIAGNOSIS — D6959 Other secondary thrombocytopenia: Secondary | ICD-10-CM

## 2011-09-22 DIAGNOSIS — E119 Type 2 diabetes mellitus without complications: Secondary | ICD-10-CM

## 2011-09-22 DIAGNOSIS — D649 Anemia, unspecified: Secondary | ICD-10-CM

## 2011-09-22 DIAGNOSIS — C182 Malignant neoplasm of ascending colon: Secondary | ICD-10-CM

## 2011-09-22 LAB — CBC WITH DIFFERENTIAL/PLATELET
BASO%: 0.2 % (ref 0.0–2.0)
Basophils Absolute: 0 10*3/uL (ref 0.0–0.1)
HCT: 34.3 % — ABNORMAL LOW (ref 38.4–49.9)
HGB: 11.9 g/dL — ABNORMAL LOW (ref 13.0–17.1)
LYMPH%: 28.2 % (ref 14.0–49.0)
MCHC: 34.7 g/dL (ref 32.0–36.0)
MONO#: 0.4 10*3/uL (ref 0.1–0.9)
NEUT%: 57.9 % (ref 39.0–75.0)
Platelets: 53 10*3/uL — ABNORMAL LOW (ref 140–400)
WBC: 4.3 10*3/uL (ref 4.0–10.3)

## 2011-09-22 NOTE — Progress Notes (Signed)
OFFICE PROGRESS NOTE  Interval history:  Mr. Haupert returns as scheduled. He completed cycle 5 FOLFOX on 09/08/2011. He denies nausea/vomiting. No mouth sores. No diarrhea. Cold sensitivity lasted approximately 8 days. He denies persistent neuropathy symptoms.   Objective: Blood pressure 120/72, pulse 75, temperature 98.2 F (36.8 C), temperature source Oral, height 6' 3.5" (1.918 m), weight 161 lb 12.8 oz (73.392 kg).  Oropharynx is without thrush or ulceration. Lungs are clear. Regular cardiac rhythm. Port-A-Cath site with mild erythema. Abdomen soft and nontender. No hepatomegaly. Extremities are without edema. Calves are soft and nontender. Vibratory sense is mildly decreased over the fingertips per tuning fork exam.  Lab Results: Lab Results  Component Value Date   WBC 4.3 09/22/2011   HGB 11.9* 09/22/2011   HCT 34.3* 09/22/2011   MCV 83.5 09/22/2011   PLT 53* 09/22/2011    Chemistry:    Chemistry      Component Value Date/Time   NA 136 09/08/2011 0845   K 3.9 09/08/2011 0845   CL 98 09/08/2011 0845   CO2 27 09/08/2011 0845   BUN 17 09/08/2011 0845   CREATININE 1.01 09/08/2011 0845      Component Value Date/Time   CALCIUM 9.4 09/08/2011 0845   ALKPHOS 107 09/08/2011 0845   AST 23 09/08/2011 0845   ALT 31 09/08/2011 0845   BILITOT 0.2* 09/08/2011 0845       Studies/Results: No results found.  Medications: I have reviewed the patient's current medications.  Assessment/Plan:  1. 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 Tumor microsatellite unstable, K-ras wild type. Adjuvant FOLFOX chemotherapy initiated on 07/14/2011. 2. Microcytic anemia. Likely secondary to iron deficiency. 3. Insulin-dependent diabetes. 4. Glaucoma. 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 A mutation in the PMS2 gene was  confirmed. He has seen a Runner, broadcasting/film/video. 6. Status post Port-A-Cath placement 07/01/2011. 7. Thrombocytopenia secondary to chemotherapy. Oxaliplatin was held with cycle 3. Oxaliplatin was resumed with cycle 4 at a 25% dose reduction. 8. Question early oxaliplatin neuropathy with decreased vibratory sense over the fingertips per tuning fork exam.  Disposition-Mr. Korn has completed 5 cycles of adjuvant FOLFOX chemotherapy. He has progressive thrombocytopenia. We will hold treatment today and reschedule for one week. He will return for a followup visit and chemotherapy on 10/13/2011. He will contact the office in the interim with any problems. We specifically discussed spontaneous bruising/bleeding.  Plan reviewed with Dr. Clelia Croft.  Lonna Cobb ANP/GNP-BC

## 2011-09-22 NOTE — Telephone Encounter (Signed)
Per staff message from Eunice, I have scheduled treatment appts.  JMW  

## 2011-09-26 ENCOUNTER — Telehealth: Payer: Self-pay | Admitting: Oncology

## 2011-09-26 ENCOUNTER — Other Ambulatory Visit: Payer: Self-pay | Admitting: Certified Registered Nurse Anesthetist

## 2011-09-26 NOTE — Telephone Encounter (Signed)
called pt and informed him of next app on 06 and to p/u june scheduled at that time

## 2011-09-29 ENCOUNTER — Other Ambulatory Visit (HOSPITAL_BASED_OUTPATIENT_CLINIC_OR_DEPARTMENT_OTHER): Payer: BC Managed Care – PPO | Admitting: Lab

## 2011-09-29 ENCOUNTER — Ambulatory Visit: Payer: BC Managed Care – PPO

## 2011-09-29 DIAGNOSIS — C182 Malignant neoplasm of ascending colon: Secondary | ICD-10-CM

## 2011-09-29 LAB — CBC WITH DIFFERENTIAL/PLATELET
Eosinophils Absolute: 0.2 10*3/uL (ref 0.0–0.5)
LYMPH%: 39.8 % (ref 14.0–49.0)
MCHC: 34.5 g/dL (ref 32.0–36.0)
MCV: 87 fL (ref 79.3–98.0)
MONO%: 23.4 % — ABNORMAL HIGH (ref 0.0–14.0)
NEUT#: 1.1 10*3/uL — ABNORMAL LOW (ref 1.5–6.5)
Platelets: 129 10*3/uL — ABNORMAL LOW (ref 140–400)
RBC: 4.4 10*6/uL (ref 4.20–5.82)
nRBC: 0 % (ref 0–0)

## 2011-09-29 NOTE — Progress Notes (Signed)
Per Dr. Truett Perna treatment is to be held today due to ANC 1.1. Pt informed and verbalized understanding of delay.

## 2011-10-04 ENCOUNTER — Encounter: Payer: Self-pay | Admitting: Certified Registered Nurse Anesthetist

## 2011-10-06 ENCOUNTER — Inpatient Hospital Stay: Payer: BC Managed Care – PPO

## 2011-10-06 ENCOUNTER — Other Ambulatory Visit: Payer: BC Managed Care – PPO | Admitting: Lab

## 2011-10-06 ENCOUNTER — Ambulatory Visit: Payer: BC Managed Care – PPO | Admitting: Oncology

## 2011-10-12 ENCOUNTER — Other Ambulatory Visit: Payer: Self-pay | Admitting: Oncology

## 2011-10-13 ENCOUNTER — Ambulatory Visit (HOSPITAL_BASED_OUTPATIENT_CLINIC_OR_DEPARTMENT_OTHER): Payer: BC Managed Care – PPO | Admitting: Oncology

## 2011-10-13 ENCOUNTER — Ambulatory Visit (HOSPITAL_BASED_OUTPATIENT_CLINIC_OR_DEPARTMENT_OTHER): Payer: BC Managed Care – PPO

## 2011-10-13 ENCOUNTER — Telehealth: Payer: Self-pay | Admitting: Oncology

## 2011-10-13 ENCOUNTER — Other Ambulatory Visit: Payer: BC Managed Care – PPO | Admitting: Lab

## 2011-10-13 VITALS — BP 126/72 | HR 76 | Temp 97.9°F | Ht 75.5 in | Wt 162.9 lb

## 2011-10-13 DIAGNOSIS — C182 Malignant neoplasm of ascending colon: Secondary | ICD-10-CM

## 2011-10-13 DIAGNOSIS — Z5111 Encounter for antineoplastic chemotherapy: Secondary | ICD-10-CM

## 2011-10-13 DIAGNOSIS — D509 Iron deficiency anemia, unspecified: Secondary | ICD-10-CM

## 2011-10-13 DIAGNOSIS — E119 Type 2 diabetes mellitus without complications: Secondary | ICD-10-CM

## 2011-10-13 DIAGNOSIS — D6959 Other secondary thrombocytopenia: Secondary | ICD-10-CM

## 2011-10-13 LAB — CBC WITH DIFFERENTIAL/PLATELET
BASO%: 0.3 % (ref 0.0–2.0)
EOS%: 1.9 % (ref 0.0–7.0)
HCT: 35.5 % — ABNORMAL LOW (ref 38.4–49.9)
LYMPH%: 25.3 % (ref 14.0–49.0)
MCH: 30.6 pg (ref 27.2–33.4)
MCHC: 34.9 g/dL (ref 32.0–36.0)
MONO%: 9.9 % (ref 0.0–14.0)
NEUT%: 62.6 % (ref 39.0–75.0)
lymph#: 1.5 10*3/uL (ref 0.9–3.3)

## 2011-10-13 LAB — COMPREHENSIVE METABOLIC PANEL
AST: 29 U/L (ref 0–37)
Alkaline Phosphatase: 107 U/L (ref 39–117)
BUN: 18 mg/dL (ref 6–23)
Creatinine, Ser: 0.77 mg/dL (ref 0.50–1.35)
Potassium: 3.9 mEq/L (ref 3.5–5.3)
Total Bilirubin: 0.5 mg/dL (ref 0.3–1.2)

## 2011-10-13 MED ORDER — OXALIPLATIN CHEMO INJECTION 100 MG/20ML
58.0000 mg/m2 | Freq: Once | INTRAVENOUS | Status: AC
  Administered 2011-10-13: 115 mg via INTRAVENOUS
  Filled 2011-10-13: qty 23

## 2011-10-13 MED ORDER — ONDANSETRON 8 MG/50ML IVPB (CHCC)
8.0000 mg | Freq: Once | INTRAVENOUS | Status: AC
  Administered 2011-10-13: 8 mg via INTRAVENOUS

## 2011-10-13 MED ORDER — DEXAMETHASONE SODIUM PHOSPHATE 10 MG/ML IJ SOLN
10.0000 mg | Freq: Once | INTRAMUSCULAR | Status: AC
  Administered 2011-10-13: 10 mg via INTRAVENOUS

## 2011-10-13 MED ORDER — FLUOROURACIL CHEMO INJECTION 2.5 GM/50ML
400.0000 mg/m2 | Freq: Once | INTRAVENOUS | Status: AC
  Administered 2011-10-13: 800 mg via INTRAVENOUS
  Filled 2011-10-13: qty 16

## 2011-10-13 MED ORDER — DEXTROSE 5 % IV SOLN
Freq: Once | INTRAVENOUS | Status: AC
  Administered 2011-10-13: 11:00:00 via INTRAVENOUS

## 2011-10-13 MED ORDER — SODIUM CHLORIDE 0.9 % IV SOLN
2400.0000 mg/m2 | INTRAVENOUS | Status: DC
  Administered 2011-10-13: 4750 mg via INTRAVENOUS
  Filled 2011-10-13: qty 95

## 2011-10-13 MED ORDER — LEUCOVORIN CALCIUM INJECTION 350 MG
400.0000 mg/m2 | Freq: Once | INTRAVENOUS | Status: AC
  Administered 2011-10-13: 788 mg via INTRAVENOUS
  Filled 2011-10-13: qty 39.4

## 2011-10-13 NOTE — Progress Notes (Signed)
   [facility]    OFFICE PROGRESS NOTE   INTERVAL HISTORY:   He returns as scheduled. He was last treated with chemotherapy on may 23rd 2013. Chemotherapy was held on 09/22/2011 due to thrombocytopenia and on 09/29/2011 due to neutropenia.  No complaints, no nausea, mouth sores, diarrhea, or neuropathy symptoms. Objective:  Vital signs in last 24 hours:  Blood pressure 126/72, pulse 76, temperature 97.9 F (36.6 C), temperature source Oral, height 6' 3.5" (1.918 m), weight 162 lb 14.4 oz (73.891 kg).    HEENT: No thrush or ulcers Resp: Lungs clear bilaterally Cardio: Regular rate and rhythm GI: No hepatosplenomegaly Vascular: No leg edema Neuro:? Very mild decrease in vibratory sense at the fingertip bilaterally    Portacath/PICC-without erythema  Lab Results:  Lab Results  Component Value Date   WBC 5.8 10/13/2011   HGB 12.4* 10/13/2011   HCT 35.5* 10/13/2011   MCV 87.7 10/13/2011   PLT 112* 10/13/2011   ANC 3.6    Medications: I have reviewed the patient's current medications.  Assessment/Plan: 1. 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 Tumor microsatellite unstable, K-ras wild type. Adjuvant FOLFOX chemotherapy initiated on 07/14/2011. 2. Microcytic anemia. Likely secondary to iron deficiency. Improved 3. Insulin-dependent diabetes. 4. Glaucoma. 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 He has seen a Runner, broadcasting/film/video. 6. Status post Port-A-Cath placement 07/01/2011. 7. Thrombocytopenia secondary to chemotherapy. Oxaliplatin was held with cycle 3. Oxaliplatin was resumed with cycle 4 at a 25% dose reduction. 8. Question early oxaliplatin neuropathy with decreased vibratory sense over the fingertips per tuning fork exam. A  very mild decrease on exam today. 9. History of neutropenia secondary to chemotherapy   Disposition:  He appears well. He has completed 5 cycles of adjuvant therapy. Chemotherapy has been delayed over the past month due to cytopenias. The plan is to proceed with cycle 6 today. We dose reduced to the oxaliplatin again today. He will return for an office visit and chemotherapy in 2 weeks. We will add Neulasta if the neutrophil count is low despite the current dose reduction.   Thornton Papas, MD  10/13/2011  10:17 AM

## 2011-10-13 NOTE — Telephone Encounter (Signed)
Gave pt appt for June and July 2013 lab, chemo, Ml and MD

## 2011-10-13 NOTE — Patient Instructions (Addendum)
[facility] Discharge Instructions for Patients Receiving Chemotherapy  Today you received the following chemotherapy agents Oxaliplatin, Leucovorin, and 5FU.  To help prevent nausea and vomiting after your treatment, we encourage you to take your nausea medication as prescribed.   If you develop nausea and vomiting that is not controlled by your nausea medication, call the clinic. If it is after clinic hours your family physician or the after hours number for the clinic or go to the Emergency Department.   BELOW ARE SYMPTOMS THAT SHOULD BE REPORTED IMMEDIATELY:  *FEVER GREATER THAN 100.5 F  *CHILLS WITH OR WITHOUT FEVER  NAUSEA AND VOMITING THAT IS NOT CONTROLLED WITH YOUR NAUSEA MEDICATION  *UNUSUAL SHORTNESS OF BREATH  *UNUSUAL BRUISING OR BLEEDING  TENDERNESS IN MOUTH AND THROAT WITH OR WITHOUT PRESENCE OF ULCERS  *URINARY PROBLEMS  *BOWEL PROBLEMS  UNUSUAL RASH Items with * indicate a potential emergency and should be followed up as soon as possible.  One of the nurses will contact you 24 hours after your treatment. Please let the nurse know about any problems that you may have experienced. Feel free to call the clinic you have any questions or concerns. The clinic phone number is [phone].   I have been informed and understand all the instructions given to me. I know to contact the clinic, my physician, or go to the Emergency Department if any problems should occur. I do not have any questions at this time, but understand that I may call the clinic during office hours   should I have any questions or need assistance in obtaining follow up care.    __________________________________________  _____________  __________ Signature of Patient or Authorized Representative            Date                   Time    __________________________________________ Nurse's Signature    

## 2011-10-15 ENCOUNTER — Ambulatory Visit (HOSPITAL_BASED_OUTPATIENT_CLINIC_OR_DEPARTMENT_OTHER): Payer: BC Managed Care – PPO

## 2011-10-15 VITALS — BP 143/72 | HR 77 | Temp 97.9°F

## 2011-10-15 DIAGNOSIS — C182 Malignant neoplasm of ascending colon: Secondary | ICD-10-CM

## 2011-10-15 DIAGNOSIS — Z452 Encounter for adjustment and management of vascular access device: Secondary | ICD-10-CM

## 2011-10-15 MED ORDER — HEPARIN SOD (PORK) LOCK FLUSH 100 UNIT/ML IV SOLN
500.0000 [IU] | Freq: Once | INTRAVENOUS | Status: AC | PRN
  Administered 2011-10-15: 500 [IU]
  Filled 2011-10-15: qty 5

## 2011-10-15 MED ORDER — SODIUM CHLORIDE 0.9 % IJ SOLN
10.0000 mL | INTRAMUSCULAR | Status: DC | PRN
  Administered 2011-10-15: 10 mL
  Filled 2011-10-15: qty 10

## 2011-10-19 ENCOUNTER — Ambulatory Visit: Payer: BC Managed Care – PPO | Admitting: Oncology

## 2011-10-19 ENCOUNTER — Other Ambulatory Visit (HOSPITAL_BASED_OUTPATIENT_CLINIC_OR_DEPARTMENT_OTHER): Payer: BC Managed Care – PPO | Admitting: Lab

## 2011-10-19 ENCOUNTER — Inpatient Hospital Stay: Payer: BC Managed Care – PPO

## 2011-10-19 ENCOUNTER — Other Ambulatory Visit: Payer: BC Managed Care – PPO

## 2011-10-19 DIAGNOSIS — C182 Malignant neoplasm of ascending colon: Secondary | ICD-10-CM

## 2011-10-19 LAB — COMPREHENSIVE METABOLIC PANEL
Albumin: 4.2 g/dL (ref 3.5–5.2)
Alkaline Phosphatase: 95 U/L (ref 39–117)
BUN: 21 mg/dL (ref 6–23)
CO2: 27 mEq/L (ref 19–32)
Calcium: 9.1 mg/dL (ref 8.4–10.5)
Glucose, Bld: 332 mg/dL — ABNORMAL HIGH (ref 70–99)
Potassium: 4.5 mEq/L (ref 3.5–5.3)
Total Protein: 6.1 g/dL (ref 6.0–8.3)

## 2011-10-19 LAB — CBC WITH DIFFERENTIAL/PLATELET
Basophils Absolute: 0 10*3/uL (ref 0.0–0.1)
Eosinophils Absolute: 0.1 10*3/uL (ref 0.0–0.5)
HGB: 13.1 g/dL (ref 13.0–17.1)
MCV: 94.4 fL (ref 79.3–98.0)
MONO#: 0.2 10*3/uL (ref 0.1–0.9)
MONO%: 7.1 % (ref 0.0–14.0)
NEUT#: 1.5 10*3/uL (ref 1.5–6.5)
Platelets: 76 10*3/uL — ABNORMAL LOW (ref 140–400)
RDW: 19.3 % — ABNORMAL HIGH (ref 11.0–14.6)
WBC: 2.5 10*3/uL — ABNORMAL LOW (ref 4.0–10.3)

## 2011-10-26 ENCOUNTER — Other Ambulatory Visit: Payer: Self-pay | Admitting: Oncology

## 2011-10-27 ENCOUNTER — Telehealth: Payer: Self-pay | Admitting: Oncology

## 2011-10-27 ENCOUNTER — Other Ambulatory Visit: Payer: Self-pay | Admitting: Oncology

## 2011-10-27 ENCOUNTER — Ambulatory Visit (HOSPITAL_BASED_OUTPATIENT_CLINIC_OR_DEPARTMENT_OTHER): Payer: BC Managed Care – PPO | Admitting: Nurse Practitioner

## 2011-10-27 ENCOUNTER — Telehealth: Payer: Self-pay | Admitting: *Deleted

## 2011-10-27 ENCOUNTER — Other Ambulatory Visit (HOSPITAL_BASED_OUTPATIENT_CLINIC_OR_DEPARTMENT_OTHER): Payer: BC Managed Care – PPO | Admitting: Lab

## 2011-10-27 ENCOUNTER — Ambulatory Visit (HOSPITAL_BASED_OUTPATIENT_CLINIC_OR_DEPARTMENT_OTHER): Payer: BC Managed Care – PPO

## 2011-10-27 VITALS — BP 129/71 | HR 84 | Temp 97.8°F | Ht 75.5 in | Wt 164.6 lb

## 2011-10-27 DIAGNOSIS — E119 Type 2 diabetes mellitus without complications: Secondary | ICD-10-CM

## 2011-10-27 DIAGNOSIS — C182 Malignant neoplasm of ascending colon: Secondary | ICD-10-CM

## 2011-10-27 DIAGNOSIS — D649 Anemia, unspecified: Secondary | ICD-10-CM

## 2011-10-27 DIAGNOSIS — C189 Malignant neoplasm of colon, unspecified: Secondary | ICD-10-CM

## 2011-10-27 DIAGNOSIS — D696 Thrombocytopenia, unspecified: Secondary | ICD-10-CM

## 2011-10-27 DIAGNOSIS — Z5111 Encounter for antineoplastic chemotherapy: Secondary | ICD-10-CM

## 2011-10-27 LAB — COMPREHENSIVE METABOLIC PANEL
ALT: 37 U/L (ref 0–53)
Albumin: 3.7 g/dL (ref 3.5–5.2)
CO2: 27 mEq/L (ref 19–32)
Chloride: 104 mEq/L (ref 96–112)
Glucose, Bld: 119 mg/dL — ABNORMAL HIGH (ref 70–99)
Potassium: 3.7 mEq/L (ref 3.5–5.3)
Sodium: 139 mEq/L (ref 135–145)
Total Bilirubin: 0.4 mg/dL (ref 0.3–1.2)
Total Protein: 6.1 g/dL (ref 6.0–8.3)

## 2011-10-27 LAB — CBC WITH DIFFERENTIAL/PLATELET
Basophils Absolute: 0 10*3/uL (ref 0.0–0.1)
Eosinophils Absolute: 0.1 10*3/uL (ref 0.0–0.5)
HGB: 12 g/dL — ABNORMAL LOW (ref 13.0–17.1)
MCV: 89.4 fL (ref 79.3–98.0)
MONO#: 0.4 10*3/uL (ref 0.1–0.9)
NEUT#: 2.4 10*3/uL (ref 1.5–6.5)
RBC: 3.79 10*6/uL — ABNORMAL LOW (ref 4.20–5.82)
RDW: 16.9 % — ABNORMAL HIGH (ref 11.0–14.6)
WBC: 4.2 10*3/uL (ref 4.0–10.3)
lymph#: 1.2 10*3/uL (ref 0.9–3.3)
nRBC: 0 % (ref 0–0)

## 2011-10-27 MED ORDER — FLUOROURACIL CHEMO INJECTION 2.5 GM/50ML
400.0000 mg/m2 | Freq: Once | INTRAVENOUS | Status: AC
  Administered 2011-10-27: 800 mg via INTRAVENOUS
  Filled 2011-10-27: qty 16

## 2011-10-27 MED ORDER — LEUCOVORIN CALCIUM INJECTION 350 MG
400.0000 mg/m2 | Freq: Once | INTRAVENOUS | Status: AC
  Administered 2011-10-27: 788 mg via INTRAVENOUS
  Filled 2011-10-27: qty 39.4

## 2011-10-27 MED ORDER — DEXTROSE 5 % IV SOLN
Freq: Once | INTRAVENOUS | Status: AC
  Administered 2011-10-27: 11:00:00 via INTRAVENOUS

## 2011-10-27 MED ORDER — HEPARIN SOD (PORK) LOCK FLUSH 100 UNIT/ML IV SOLN
500.0000 [IU] | Freq: Once | INTRAVENOUS | Status: DC | PRN
  Filled 2011-10-27: qty 5

## 2011-10-27 MED ORDER — OXALIPLATIN CHEMO INJECTION 100 MG/20ML
58.0000 mg/m2 | Freq: Once | INTRAVENOUS | Status: AC
  Administered 2011-10-27: 115 mg via INTRAVENOUS
  Filled 2011-10-27: qty 23

## 2011-10-27 MED ORDER — SODIUM CHLORIDE 0.9 % IJ SOLN
10.0000 mL | INTRAMUSCULAR | Status: DC | PRN
  Filled 2011-10-27: qty 10

## 2011-10-27 MED ORDER — SODIUM CHLORIDE 0.9 % IV SOLN
2400.0000 mg/m2 | INTRAVENOUS | Status: DC
  Administered 2011-10-27: 4750 mg via INTRAVENOUS
  Filled 2011-10-27: qty 95

## 2011-10-27 MED ORDER — ONDANSETRON 8 MG/50ML IVPB (CHCC)
8.0000 mg | Freq: Once | INTRAVENOUS | Status: AC
  Administered 2011-10-27: 8 mg via INTRAVENOUS

## 2011-10-27 MED ORDER — DEXAMETHASONE SODIUM PHOSPHATE 10 MG/ML IJ SOLN
10.0000 mg | Freq: Once | INTRAMUSCULAR | Status: AC
  Administered 2011-10-27: 10 mg via INTRAVENOUS

## 2011-10-27 NOTE — Progress Notes (Signed)
OFFICE PROGRESS NOTE  Interval history:  Mr. Cashman returns as scheduled. He completed cycle 6 of FOLFOX 10/13/2011. No mouth sores. No diarrhea. Cold sensitivity lasted 5-6 days. He denies persistent neuropathy symptoms. He developed nausea 3-4 days after the chemotherapy. The nausea was relieved with his home antiemetic.   Objective: Blood pressure 129/71, pulse 84, temperature 97.8 F (36.6 C), temperature source Oral, height 6' 3.5" (1.918 m), weight 164 lb 9.6 oz (74.662 kg).  Oropharynx is without thrush or ulceration. Lungs are clear. Regular cardiac rhythm. Port-A-Cath site is without erythema. Abdomen is soft and nontender. No hepatomegaly. Extremities are without edema. Vibratory sense is mildly decreased over the fingertips per tuning fork exam.  Lab Results: Lab Results  Component Value Date   WBC 4.2 10/27/2011   HGB 12.0* 10/27/2011   HCT 33.9* 10/27/2011   MCV 89.4 10/27/2011   PLT 92* 10/27/2011    Chemistry:    Chemistry      Component Value Date/Time   NA 139 10/19/2011 1050   K 4.5 10/19/2011 1050   CL 99 10/19/2011 1050   CO2 27 10/19/2011 1050   BUN 21 10/19/2011 1050   CREATININE 0.96 10/19/2011 1050      Component Value Date/Time   CALCIUM 9.1 10/19/2011 1050   ALKPHOS 95 10/19/2011 1050   AST 30 10/19/2011 1050   ALT 23 10/19/2011 1050   BILITOT 0.5 10/19/2011 1050       Studies/Results: No results found.  Medications: I have reviewed the patient's current medications.  Assessment/Plan:  1. 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 Tumor microsatellite unstable, K-ras wild type. Adjuvant FOLFOX chemotherapy initiated on 07/14/2011. 2. Microcytic anemia. Likely secondary to iron deficiency. Improved 3. Insulin-dependent diabetes. 4. Glaucoma. 5. 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 He has seen a Runner, broadcasting/film/video. 6. Status post Port-A-Cath placement 07/01/2011. 7. Thrombocytopenia secondary to chemotherapy. Oxaliplatin was held with cycle 3. Oxaliplatin was resumed with cycle 4 at a 25% dose reduction. Oxaliplatin was further dose reduced beginning with cycle 6 due to cytopenias. 8. Question early oxaliplatin neuropathy with decreased vibratory sense over the fingertips per tuning fork exam.  9. History of neutropenia secondary to chemotherapy. The neutrophil count is normal today.  Disposition-Mr. Barros appears stable. He has completed 6 cycles of adjuvant FOLFOX chemotherapy. He has mild thrombocytopenia on labs today. Plan to proceed with cycle 7 FOLFOX today as scheduled. He will return for a followup visit and cycle 8 in 2 weeks. He will contact the office in the interim with any problems.  Plan reviewed with Dr. Truett Perna.  Lonna Cobb ANP/GNP-BC

## 2011-10-27 NOTE — Patient Instructions (Addendum)
[facility] Discharge Instructions for Patients Receiving Chemotherapy  Today you received the following chemotherapy agents: oxilaplatin, leucovorin, 5FU, 5FU pump.    To help prevent nausea and vomiting after your treatment, we encourage you to take your nausea medication. Begin taking it at 10:30 pm or bedtime and take it as often as prescribed for the next 48-72 hours.   If you develop nausea and vomiting that is not controlled by your nausea medication, call the clinic. If it is after clinic hours your family physician or the after hours number for the clinic or go to the Emergency Department.   BELOW ARE SYMPTOMS THAT SHOULD BE REPORTED IMMEDIATELY:  *FEVER GREATER THAN 100.5 F  *CHILLS WITH OR WITHOUT FEVER  NAUSEA AND VOMITING THAT IS NOT CONTROLLED WITH YOUR NAUSEA MEDICATION  *UNUSUAL SHORTNESS OF BREATH  *UNUSUAL BRUISING OR BLEEDING  TENDERNESS IN MOUTH AND THROAT WITH OR WITHOUT PRESENCE OF ULCERS  *URINARY PROBLEMS  *BOWEL PROBLEMS  UNUSUAL RASH Items with * indicate a potential emergency and should be followed up as soon as possible.  Feel free to call the clinic if you have any questions or concerns. The clinic phone number is [phone].   I have been informed and understand all the instructions given to me. I know to contact the clinic, my physician, or go to the Emergency Department if any problems should occur. I do not have any questions at this time, but understand that I may call the clinic during office hours   should I have any questions or need assistance in obtaining follow up care.    __________________________________________  _____________  __________ Signature of Patient or Authorized Representative            Date                   Time    __________________________________________ Nurse's Signature

## 2011-10-27 NOTE — Telephone Encounter (Signed)
appts made and printed for pt aom °

## 2011-10-27 NOTE — Telephone Encounter (Signed)
Per staff message I have scheudled appts.  JMW  

## 2011-10-27 NOTE — Progress Notes (Signed)
Patient tolerated treatment well.  Discharged to home with no complaints.

## 2011-10-29 ENCOUNTER — Other Ambulatory Visit: Payer: Self-pay | Admitting: Emergency Medicine

## 2011-10-29 ENCOUNTER — Ambulatory Visit (HOSPITAL_BASED_OUTPATIENT_CLINIC_OR_DEPARTMENT_OTHER): Payer: BC Managed Care – PPO

## 2011-10-29 VITALS — BP 152/72 | HR 69 | Temp 98.3°F

## 2011-10-29 DIAGNOSIS — C182 Malignant neoplasm of ascending colon: Secondary | ICD-10-CM

## 2011-10-29 DIAGNOSIS — Z452 Encounter for adjustment and management of vascular access device: Secondary | ICD-10-CM

## 2011-10-29 MED ORDER — SODIUM CHLORIDE 0.9 % IJ SOLN
10.0000 mL | INTRAMUSCULAR | Status: DC | PRN
  Administered 2011-10-29: 10 mL
  Filled 2011-10-29: qty 10

## 2011-10-29 MED ORDER — HEPARIN SOD (PORK) LOCK FLUSH 100 UNIT/ML IV SOLN
500.0000 [IU] | Freq: Once | INTRAVENOUS | Status: AC | PRN
  Administered 2011-10-29: 500 [IU]
  Filled 2011-10-29: qty 5

## 2011-11-09 ENCOUNTER — Other Ambulatory Visit: Payer: Self-pay | Admitting: Oncology

## 2011-11-10 ENCOUNTER — Telehealth: Payer: Self-pay | Admitting: *Deleted

## 2011-11-10 ENCOUNTER — Other Ambulatory Visit (HOSPITAL_BASED_OUTPATIENT_CLINIC_OR_DEPARTMENT_OTHER): Payer: BC Managed Care – PPO | Admitting: Lab

## 2011-11-10 ENCOUNTER — Ambulatory Visit (HOSPITAL_BASED_OUTPATIENT_CLINIC_OR_DEPARTMENT_OTHER): Payer: BC Managed Care – PPO | Admitting: Oncology

## 2011-11-10 ENCOUNTER — Ambulatory Visit (HOSPITAL_BASED_OUTPATIENT_CLINIC_OR_DEPARTMENT_OTHER): Payer: BC Managed Care – PPO

## 2011-11-10 VITALS — BP 130/78 | HR 71 | Temp 97.7°F | Ht 75.5 in | Wt 162.7 lb

## 2011-11-10 DIAGNOSIS — D696 Thrombocytopenia, unspecified: Secondary | ICD-10-CM

## 2011-11-10 DIAGNOSIS — D649 Anemia, unspecified: Secondary | ICD-10-CM

## 2011-11-10 DIAGNOSIS — C182 Malignant neoplasm of ascending colon: Secondary | ICD-10-CM

## 2011-11-10 DIAGNOSIS — Z5111 Encounter for antineoplastic chemotherapy: Secondary | ICD-10-CM

## 2011-11-10 DIAGNOSIS — D702 Other drug-induced agranulocytosis: Secondary | ICD-10-CM

## 2011-11-10 LAB — COMPREHENSIVE METABOLIC PANEL
AST: 35 U/L (ref 0–37)
Alkaline Phosphatase: 111 U/L (ref 39–117)
BUN: 18 mg/dL (ref 6–23)
Calcium: 9.5 mg/dL (ref 8.4–10.5)
Creatinine, Ser: 0.9 mg/dL (ref 0.50–1.35)
Total Bilirubin: 0.4 mg/dL (ref 0.3–1.2)

## 2011-11-10 LAB — CBC WITH DIFFERENTIAL/PLATELET
Basophils Absolute: 0 10*3/uL (ref 0.0–0.1)
EOS%: 3.5 % (ref 0.0–7.0)
HCT: 36 % — ABNORMAL LOW (ref 38.4–49.9)
HGB: 12.3 g/dL — ABNORMAL LOW (ref 13.0–17.1)
LYMPH%: 33.3 % (ref 14.0–49.0)
MCH: 33.8 pg — ABNORMAL HIGH (ref 27.2–33.4)
MCV: 98.6 fL — ABNORMAL HIGH (ref 79.3–98.0)
MONO%: 13.7 % (ref 0.0–14.0)
NEUT%: 48.8 % (ref 39.0–75.0)

## 2011-11-10 MED ORDER — FLUOROURACIL CHEMO INJECTION 2.5 GM/50ML
400.0000 mg/m2 | Freq: Once | INTRAVENOUS | Status: AC
  Administered 2011-11-10: 800 mg via INTRAVENOUS
  Filled 2011-11-10: qty 16

## 2011-11-10 MED ORDER — DEXTROSE 5 % IV SOLN
Freq: Once | INTRAVENOUS | Status: AC
  Administered 2011-11-10: 10:00:00 via INTRAVENOUS

## 2011-11-10 MED ORDER — SODIUM CHLORIDE 0.9 % IV SOLN
2400.0000 mg/m2 | INTRAVENOUS | Status: DC
  Administered 2011-11-10: 4750 mg via INTRAVENOUS
  Filled 2011-11-10: qty 95

## 2011-11-10 MED ORDER — LEUCOVORIN CALCIUM INJECTION 350 MG
400.0000 mg/m2 | Freq: Once | INTRAVENOUS | Status: AC
  Administered 2011-11-10: 788 mg via INTRAVENOUS
  Filled 2011-11-10: qty 39.4

## 2011-11-10 NOTE — Progress Notes (Signed)
   [facility]    OFFICE PROGRESS NOTE   INTERVAL HISTORY:   He returns as scheduled. He completed another cycle of chemotherapy on 10/27/2011. He tolerated the chemotherapy well. No nausea, mouth sores, or diarrhea following chemotherapy. No neuropathy symptoms.  Objective:  Vital signs in last 24 hours:  Blood pressure 130/78, pulse 71, temperature 97.7 F (36.5 C), temperature source Oral, height 6' 3.5" (1.918 m), weight 162 lb 11.2 oz (73.8 kg).    HEENT: No thrush or ulcer Resp: Lungs clear bilaterally Cardio: Regular rate and rhythm GI: No hepatomegaly, nontender Vascular: No leg edema Neuro: Very mild decrease in vibratory sense at the fingertips    Portacath/PICC-without erythema  Lab Results:  Lab Results  Component Value Date   WBC 3.1* 11/10/2011   HGB 12.3* 11/10/2011   HCT 36.0* 11/10/2011   MCV 98.6* 11/10/2011   PLT 72* 11/10/2011   ANC 1.5    Medications: I have reviewed the patient's current medications.  Assessment/Plan: 1. 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 Tumor microsatellite unstable, K-ras wild type. Adjuvant FOLFOX chemotherapy initiated on 07/14/2011. 2. Microcytic anemia. Likely secondary to iron deficiency. Improved 3. Insulin-dependent diabetes. 4. Glaucoma. 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 He has seen a Runner, broadcasting/film/video. He will be contacted with updated information regarding screening recommendations. 6. Status post Port-A-Cath placement 07/01/2011. 7. Thrombocytopenia secondary to chemotherapy. Oxaliplatin was held with cycle 3. Oxaliplatin was resumed with cycle 4 at a 25% dose reduction. Oxaliplatin was further dose reduced beginning with cycle 6 due to cytopenias. The  oxaliplatin will be held today. 8. Question early oxaliplatin neuropathy with decreased vibratory sense over the fingertips per tuning fork exam.  9. History of neutropenia secondary to chemotherapy. The neutrophil count is in the low normal range today.  Disposition:  He appears stable. The plan is to proceed with cycle 8 of adjuvant FOLFOX today. Oxaliplatin will be held with chemotherapy today do to thrombocytopenia. He will return for an office visit and cycle 9 FOLFOX in 2 weeks.   Thornton Papas, MD  11/10/2011  10:10 AM

## 2011-11-10 NOTE — Progress Notes (Signed)
Discharged at 1325 by himself to home in [place].  Knows to return Saturday at 11:30 am for pump d/c.

## 2011-11-10 NOTE — Patient Instructions (Addendum)
[facility] Discharge Instructions for Patients Receiving Chemotherapy  Today you received the following chemotherapy agents Leucovorin, 5FU To help prevent nausea and vomiting after your treatment, we encourage you to take your nausea medication Compazine 10  mg Begin taking it anytime upon discharge and take it as often as prescribed for the next 48 hrs and as needed.   If you develop nausea and vomiting that is not controlled by your nausea medication, call the clinic. If it is after clinic hours your family physician or the after hours number for the clinic or go to the Emergency Department.   BELOW ARE SYMPTOMS THAT SHOULD BE REPORTED IMMEDIATELY:  *FEVER GREATER THAN 100.5 F  *CHILLS WITH OR WITHOUT FEVER  NAUSEA AND VOMITING THAT IS NOT CONTROLLED WITH YOUR NAUSEA MEDICATION  *UNUSUAL SHORTNESS OF BREATH  *UNUSUAL BRUISING OR BLEEDING  TENDERNESS IN MOUTH AND THROAT WITH OR WITHOUT PRESENCE OF ULCERS  *URINARY PROBLEMS  *BOWEL PROBLEMS  UNUSUAL RASH Items with * indicate a potential emergency and should be followed up as soon as possible.  Please call and let a nurse know about any problems that you may have experienced. Feel free to call the clinic you have any questions or concerns. The clinic phone number is [phone].   Call the 1-800 infusystem number on the pump if any problems with the pump.   I have been informed and understand all the instructions given to me. I know to contact the clinic, my physician, or go to the Emergency Department if any problems should occur. I do not have any questions at this time, but understand that I may call the clinic during office hours   should I have any questions or need assistance in obtaining follow up care.    __________________________________________  _____________  __________ Signature of Patient or Authorized Representative            Date                    Time    __________________________________________ Nurse's Signature

## 2011-11-10 NOTE — Telephone Encounter (Signed)
Sent Greg Robertson email to set up patient for 12-08-2011 treatment and 12-10-2011 pump d/c instructed patient when he comes in on Saturday to get someone printed out an new calendar

## 2011-11-12 ENCOUNTER — Ambulatory Visit (HOSPITAL_BASED_OUTPATIENT_CLINIC_OR_DEPARTMENT_OTHER): Payer: BC Managed Care – PPO

## 2011-11-12 VITALS — BP 148/82 | HR 90 | Temp 97.9°F

## 2011-11-12 DIAGNOSIS — C182 Malignant neoplasm of ascending colon: Secondary | ICD-10-CM

## 2011-11-12 DIAGNOSIS — Z452 Encounter for adjustment and management of vascular access device: Secondary | ICD-10-CM

## 2011-11-12 MED ORDER — SODIUM CHLORIDE 0.9 % IJ SOLN
10.0000 mL | INTRAMUSCULAR | Status: DC | PRN
  Administered 2011-11-12: 10 mL
  Filled 2011-11-12: qty 10

## 2011-11-12 MED ORDER — HEPARIN SOD (PORK) LOCK FLUSH 100 UNIT/ML IV SOLN
500.0000 [IU] | Freq: Once | INTRAVENOUS | Status: AC | PRN
  Administered 2011-11-12: 500 [IU]
  Filled 2011-11-12: qty 5

## 2011-11-14 ENCOUNTER — Telehealth: Payer: Self-pay | Admitting: Genetic Counselor

## 2011-11-14 NOTE — Telephone Encounter (Signed)
Discussed updated recommendations for PMS2 mutation carriers.  Will send a letter out regarding these guidelines in the next couple weeks.

## 2011-11-20 ENCOUNTER — Other Ambulatory Visit: Payer: Self-pay | Admitting: Oncology

## 2011-11-22 ENCOUNTER — Other Ambulatory Visit: Payer: Self-pay | Admitting: Oncology

## 2011-11-22 ENCOUNTER — Encounter: Payer: Self-pay | Admitting: Genetic Counselor

## 2011-11-24 ENCOUNTER — Ambulatory Visit (HOSPITAL_BASED_OUTPATIENT_CLINIC_OR_DEPARTMENT_OTHER): Payer: BC Managed Care – PPO

## 2011-11-24 ENCOUNTER — Telehealth: Payer: Self-pay | Admitting: Oncology

## 2011-11-24 ENCOUNTER — Other Ambulatory Visit (HOSPITAL_BASED_OUTPATIENT_CLINIC_OR_DEPARTMENT_OTHER): Payer: BC Managed Care – PPO | Admitting: Lab

## 2011-11-24 ENCOUNTER — Ambulatory Visit: Payer: BC Managed Care – PPO | Admitting: Nutrition

## 2011-11-24 ENCOUNTER — Ambulatory Visit (HOSPITAL_BASED_OUTPATIENT_CLINIC_OR_DEPARTMENT_OTHER): Payer: BC Managed Care – PPO | Admitting: Nurse Practitioner

## 2011-11-24 VITALS — BP 147/86 | HR 73 | Temp 98.3°F | Resp 20 | Ht 75.5 in | Wt 167.2 lb

## 2011-11-24 DIAGNOSIS — D696 Thrombocytopenia, unspecified: Secondary | ICD-10-CM

## 2011-11-24 DIAGNOSIS — D649 Anemia, unspecified: Secondary | ICD-10-CM

## 2011-11-24 DIAGNOSIS — C182 Malignant neoplasm of ascending colon: Secondary | ICD-10-CM

## 2011-11-24 DIAGNOSIS — E119 Type 2 diabetes mellitus without complications: Secondary | ICD-10-CM

## 2011-11-24 DIAGNOSIS — Z5111 Encounter for antineoplastic chemotherapy: Secondary | ICD-10-CM

## 2011-11-24 LAB — CBC WITH DIFFERENTIAL/PLATELET
BASO%: 0.6 % (ref 0.0–2.0)
Eosinophils Absolute: 0.2 10*3/uL (ref 0.0–0.5)
MCHC: 35 g/dL (ref 32.0–36.0)
MONO#: 0.8 10*3/uL (ref 0.1–0.9)
NEUT#: 2.2 10*3/uL (ref 1.5–6.5)
Platelets: 96 10*3/uL — ABNORMAL LOW (ref 140–400)
RBC: 3.69 10*6/uL — ABNORMAL LOW (ref 4.20–5.82)
RDW: 15.8 % — ABNORMAL HIGH (ref 11.0–14.6)
WBC: 4.7 10*3/uL (ref 4.0–10.3)
lymph#: 1.5 10*3/uL (ref 0.9–3.3)

## 2011-11-24 LAB — COMPREHENSIVE METABOLIC PANEL
ALT: 65 U/L — ABNORMAL HIGH (ref 0–53)
Albumin: 4 g/dL (ref 3.5–5.2)
CO2: 30 mEq/L (ref 19–32)
Chloride: 104 mEq/L (ref 96–112)
Glucose, Bld: 88 mg/dL (ref 70–99)
Potassium: 4.3 mEq/L (ref 3.5–5.3)
Sodium: 139 mEq/L (ref 135–145)
Total Protein: 6.6 g/dL (ref 6.0–8.3)

## 2011-11-24 MED ORDER — SODIUM CHLORIDE 0.9 % IV SOLN
2400.0000 mg/m2 | INTRAVENOUS | Status: DC
  Administered 2011-11-24: 4750 mg via INTRAVENOUS
  Filled 2011-11-24: qty 95

## 2011-11-24 MED ORDER — DEXAMETHASONE SODIUM PHOSPHATE 10 MG/ML IJ SOLN
10.0000 mg | Freq: Once | INTRAMUSCULAR | Status: AC
  Administered 2011-11-24: 10 mg via INTRAVENOUS

## 2011-11-24 MED ORDER — FLUOROURACIL CHEMO INJECTION 2.5 GM/50ML
400.0000 mg/m2 | Freq: Once | INTRAVENOUS | Status: AC
  Administered 2011-11-24: 800 mg via INTRAVENOUS
  Filled 2011-11-24: qty 16

## 2011-11-24 MED ORDER — LEUCOVORIN CALCIUM INJECTION 350 MG
400.0000 mg/m2 | Freq: Once | INTRAVENOUS | Status: AC
  Administered 2011-11-24: 788 mg via INTRAVENOUS
  Filled 2011-11-24: qty 39.4

## 2011-11-24 MED ORDER — ONDANSETRON 8 MG/50ML IVPB (CHCC)
8.0000 mg | Freq: Once | INTRAVENOUS | Status: AC
  Administered 2011-11-24: 8 mg via INTRAVENOUS

## 2011-11-24 MED ORDER — DEXTROSE 5 % IV SOLN
Freq: Once | INTRAVENOUS | Status: AC
  Administered 2011-11-24: 11:00:00 via INTRAVENOUS

## 2011-11-24 MED ORDER — OXALIPLATIN CHEMO INJECTION 100 MG/20ML
58.0000 mg/m2 | Freq: Once | INTRAVENOUS | Status: AC
  Administered 2011-11-24: 115 mg via INTRAVENOUS
  Filled 2011-11-24: qty 23

## 2011-11-24 NOTE — Telephone Encounter (Signed)
appts made and printed for pt,pt aware that 9/5 tx will be added    aom

## 2011-11-24 NOTE — Progress Notes (Signed)
OFFICE PROGRESS NOTE  Interval history:  Mr. Wrinkle returns as scheduled. He completed cycle 8 adjuvant FOLFOX 11/10/2011. Oxaliplatin was held due to to a platelet count of 72,000. He is feeling well. Appetite has improved. He reports gaining 5 pounds since his last visit. No nausea or vomiting following the most recent cycle of chemotherapy. No diarrhea. No mouth sores. He denies numbness or tingling in his hands or feet.   Objective: Blood pressure 147/86, pulse 73, temperature 98.3 F (36.8 C), temperature source Oral, resp. rate 20, height 6' 3.5" (1.918 m), weight 167 lb 3.2 oz (75.841 kg).  Oropharynx is without thrush or ulceration. Lungs are clear. Regular cardiac rhythm. Port-A-Cath site is without erythema. Abdomen is soft and nontender. No organomegaly. Extremities are without edema. Vibratory sense is very mildly decreased at the fingertips per tuning fork exam.  Lab Results: Lab Results  Component Value Date   WBC 4.7 11/24/2011   HGB 12.4* 11/24/2011   HCT 35.4* 11/24/2011   MCV 95.9 11/24/2011   PLT 96* 11/24/2011    Chemistry:    Chemistry      Component Value Date/Time   NA 139 11/10/2011 0821   K 4.0 11/10/2011 0821   CL 101 11/10/2011 0821   CO2 28 11/10/2011 0821   BUN 18 11/10/2011 0821   CREATININE 0.90 11/10/2011 0821      Component Value Date/Time   CALCIUM 9.5 11/10/2011 0821   ALKPHOS 111 11/10/2011 0821   AST 35 11/10/2011 0821   ALT 36 11/10/2011 0821   BILITOT 0.4 11/10/2011 0821       Studies/Results: No results found.  Medications: I have reviewed the patient's current medications.  Assessment/Plan:  1. 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 Tumor microsatellite unstable, K-ras wild type. Adjuvant FOLFOX chemotherapy initiated on 07/14/2011. 2. Microcytic anemia. Likely secondary to iron deficiency. Improved. He continues oral iron. 3. Insulin-dependent  diabetes. 4. Glaucoma. 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 He has seen a Runner, broadcasting/film/video. He will be contacted with updated information regarding screening recommendations. 6. Status post Port-A-Cath placement 07/01/2011. 7. Thrombocytopenia secondary to chemotherapy. Oxaliplatin was held with cycle 3. Oxaliplatin was resumed with cycle 4 at a 25% dose reduction. Oxaliplatin was further dose reduced beginning with cycle 6 due to cytopenias. The oxaliplatin was held with cycle 8. 8. Question early oxaliplatin neuropathy with decreased vibratory sense over the fingertips per tuning fork exam.  Stable. 9. History of neutropenia secondary to chemotherapy. The neutrophil count is in normal range today.  Disposition-Mr. Sacco appears stable. The platelet count is better. Plan to proceed with cycle 9 of FOLFOX today as scheduled with oxaliplatin. He will return for a followup visit and cycle 10 FOLFOX in 2 weeks. He will contact the office in the interim with any problems.  Lonna Cobb ANP/GNP-BC

## 2011-11-24 NOTE — Progress Notes (Signed)
Ok to proceed with tx using current labs per [employer] pa in office visit note.   dmr

## 2011-11-24 NOTE — Patient Instructions (Addendum)
[facility] Discharge Instructions for Patients Receiving Chemotherapy  Today you received the following chemotherapy agents oxaliplatin/leucovorin/14fu  To help prevent nausea and vomiting after your treatment, we encourage you to take your nausea medication  and take it as often as prescribed  If you develop nausea and vomiting that is not controlled by your nausea medication, call the clinic. If it is after clinic hours your family physician or the after hours number for the clinic or go to the Emergency Department.   BELOW ARE SYMPTOMS THAT SHOULD BE REPORTED IMMEDIATELY:  *FEVER GREATER THAN 100.5 F  *CHILLS WITH OR WITHOUT FEVER  NAUSEA AND VOMITING THAT IS NOT CONTROLLED WITH YOUR NAUSEA MEDICATION  *UNUSUAL SHORTNESS OF BREATH  *UNUSUAL BRUISING OR BLEEDING  TENDERNESS IN MOUTH AND THROAT WITH OR WITHOUT PRESENCE OF ULCERS  *URINARY PROBLEMS  *BOWEL PROBLEMS  UNUSUAL RASH Items with * indicate a potential emergency and should be followed up as soon as possible.  One of the nurses will contact you 24 hours after your treatment. Please let the nurse know about any problems that you may have experienced. Feel free to call the clinic you have any questions or concerns. The clinic phone number is [phone].   I have been informed and understand all the instructions given to me. I know to contact the clinic, my physician, or go to the Emergency Department if any problems should occur. I do not have any questions at this time, but understand that I may call the clinic during office hours   should I have any questions or need assistance in obtaining follow up care.    __________________________________________  _____________  __________ Signature of Patient or Authorized Representative            Date                   Time    __________________________________________ Nurse's Signature

## 2011-11-24 NOTE — Progress Notes (Signed)
Greg Robertson reports he has eaten very well since his last chemotherapy treatment.  His weight has increased approximately 5 pounds to 167.2 pounds from 162.7.  He reports that he did not receive oxaliplatin during his last treatment and that has allowed him to eat the foods that he wants and gain his weight back.  NUTRITION DIAGNOSIS:  Unintentional weight loss has resolved.  I have encouraged Greg Robertson to contact me if he has questions or problems in the future.  The patient denies any nutrition assistance at this time.   ______________________________ Zenovia Jarred, RD, CSO, LDN Clinical Nutrition Specialist BN/MEDQ  D:  11/24/2011  T:  11/24/2011  Job:  [id number]

## 2011-11-26 ENCOUNTER — Ambulatory Visit (HOSPITAL_BASED_OUTPATIENT_CLINIC_OR_DEPARTMENT_OTHER): Payer: BC Managed Care – PPO

## 2011-11-26 VITALS — BP 144/73 | HR 81 | Temp 98.0°F | Resp 18

## 2011-11-26 DIAGNOSIS — C182 Malignant neoplasm of ascending colon: Secondary | ICD-10-CM

## 2011-11-26 MED ORDER — SODIUM CHLORIDE 0.9 % IJ SOLN
10.0000 mL | INTRAMUSCULAR | Status: DC | PRN
  Administered 2011-11-26: 10 mL
  Filled 2011-11-26: qty 10

## 2011-11-26 MED ORDER — HEPARIN SOD (PORK) LOCK FLUSH 100 UNIT/ML IV SOLN
500.0000 [IU] | Freq: Once | INTRAVENOUS | Status: AC | PRN
  Administered 2011-11-26: 500 [IU]
  Filled 2011-11-26: qty 5

## 2011-11-28 ENCOUNTER — Telehealth: Payer: Self-pay

## 2011-11-28 NOTE — Telephone Encounter (Signed)
Per POF I have made appt. TMB 

## 2011-11-29 ENCOUNTER — Telehealth: Payer: Self-pay | Admitting: Oncology

## 2011-11-29 NOTE — Telephone Encounter (Signed)
Genetic letter mailed out.  °

## 2011-12-07 ENCOUNTER — Other Ambulatory Visit: Payer: Self-pay | Admitting: Oncology

## 2011-12-08 ENCOUNTER — Other Ambulatory Visit (HOSPITAL_BASED_OUTPATIENT_CLINIC_OR_DEPARTMENT_OTHER): Payer: BC Managed Care – PPO | Admitting: Lab

## 2011-12-08 ENCOUNTER — Ambulatory Visit (HOSPITAL_BASED_OUTPATIENT_CLINIC_OR_DEPARTMENT_OTHER): Payer: BC Managed Care – PPO | Admitting: Oncology

## 2011-12-08 ENCOUNTER — Telehealth: Payer: Self-pay | Admitting: Oncology

## 2011-12-08 ENCOUNTER — Telehealth: Payer: Self-pay | Admitting: *Deleted

## 2011-12-08 ENCOUNTER — Ambulatory Visit (HOSPITAL_BASED_OUTPATIENT_CLINIC_OR_DEPARTMENT_OTHER): Payer: BC Managed Care – PPO

## 2011-12-08 VITALS — BP 154/82 | HR 86 | Temp 98.4°F | Resp 18 | Ht 75.5 in | Wt 166.5 lb

## 2011-12-08 DIAGNOSIS — D509 Iron deficiency anemia, unspecified: Secondary | ICD-10-CM

## 2011-12-08 DIAGNOSIS — D6959 Other secondary thrombocytopenia: Secondary | ICD-10-CM

## 2011-12-08 DIAGNOSIS — C182 Malignant neoplasm of ascending colon: Secondary | ICD-10-CM

## 2011-12-08 DIAGNOSIS — Z1509 Genetic susceptibility to other malignant neoplasm: Secondary | ICD-10-CM

## 2011-12-08 DIAGNOSIS — Z5111 Encounter for antineoplastic chemotherapy: Secondary | ICD-10-CM

## 2011-12-08 LAB — COMPREHENSIVE METABOLIC PANEL
ALT: 30 U/L (ref 0–53)
AST: 33 U/L (ref 0–37)
Albumin: 3.8 g/dL (ref 3.5–5.2)
Alkaline Phosphatase: 130 U/L — ABNORMAL HIGH (ref 39–117)
BUN: 17 mg/dL (ref 6–23)
Potassium: 4.1 mEq/L (ref 3.5–5.3)

## 2011-12-08 LAB — CBC & DIFF AND RETIC
Basophils Absolute: 0 10*3/uL (ref 0.0–0.1)
EOS%: 6.6 % (ref 0.0–7.0)
Eosinophils Absolute: 0.3 10*3/uL (ref 0.0–0.5)
HCT: 34.8 % — ABNORMAL LOW (ref 38.4–49.9)
HGB: 12.4 g/dL — ABNORMAL LOW (ref 13.0–17.1)
Immature Retic Fract: 24.1 % — ABNORMAL HIGH (ref 3.00–10.60)
MONO#: 0.6 10*3/uL (ref 0.1–0.9)
NEUT#: 1.8 10*3/uL (ref 1.5–6.5)
NEUT%: 44.6 % (ref 39.0–75.0)
RDW: 14.8 % — ABNORMAL HIGH (ref 11.0–14.6)
Retic %: 5.22 % — ABNORMAL HIGH (ref 0.80–1.80)
Retic Ct Abs: 189.49 10*3/uL — ABNORMAL HIGH (ref 34.80–93.90)
WBC: 3.9 10*3/uL — ABNORMAL LOW (ref 4.0–10.3)
lymph#: 1.3 10*3/uL (ref 0.9–3.3)

## 2011-12-08 MED ORDER — DEXTROSE 5 % IV SOLN
Freq: Once | INTRAVENOUS | Status: AC
  Administered 2011-12-08: 10:00:00 via INTRAVENOUS

## 2011-12-08 MED ORDER — LEUCOVORIN CALCIUM INJECTION 350 MG
400.0000 mg/m2 | Freq: Once | INTRAVENOUS | Status: AC
  Administered 2011-12-08: 788 mg via INTRAVENOUS
  Filled 2011-12-08: qty 39.4

## 2011-12-08 MED ORDER — FLUOROURACIL CHEMO INJECTION 2.5 GM/50ML
400.0000 mg/m2 | Freq: Once | INTRAVENOUS | Status: AC
  Administered 2011-12-08: 800 mg via INTRAVENOUS
  Filled 2011-12-08: qty 16

## 2011-12-08 MED ORDER — SODIUM CHLORIDE 0.9 % IV SOLN
2400.0000 mg/m2 | INTRAVENOUS | Status: DC
  Administered 2011-12-08: 4750 mg via INTRAVENOUS
  Filled 2011-12-08: qty 95

## 2011-12-08 NOTE — Telephone Encounter (Signed)
Per staff message and POF I have scheduled appts.  JMW  

## 2011-12-08 NOTE — Patient Instructions (Addendum)
[facility] Discharge Instructions for Patients Receiving Chemotherapy  Today you received the following chemotherapy agents leucovorin, 61fu, 67fu pump  If you develop nausea and vomiting that is not controlled by your nausea medication, call the clinic. If it is after clinic hours your family physician or the after hours number for the clinic or go to the Emergency Department.   BELOW ARE SYMPTOMS THAT SHOULD BE REPORTED IMMEDIATELY:  *FEVER GREATER THAN 100.5 F  *CHILLS WITH OR WITHOUT FEVER  NAUSEA AND VOMITING THAT IS NOT CONTROLLED WITH YOUR NAUSEA MEDICATION  *UNUSUAL SHORTNESS OF BREATH  *UNUSUAL BRUISING OR BLEEDING  TENDERNESS IN MOUTH AND THROAT WITH OR WITHOUT PRESENCE OF ULCERS  *URINARY PROBLEMS  *BOWEL PROBLEMS  UNUSUAL RASH Items with * indicate a potential emergency and should be followed up as soon as possible.  One of the nurses will contact you 24 hours after your treatment. Please let the nurse know about any problems that you may have experienced. Feel free to call the clinic you have any questions or concerns. The clinic phone number is [phone].   I have been informed and understand all the instructions given to me. I know to contact the clinic, my physician, or go to the Emergency Department if any problems should occur. I do not have any questions at this time, but understand that I may call the clinic during office hours   should I have any questions or need assistance in obtaining follow up care.    __________________________________________  _____________  __________ Signature of Patient or Authorized Representative            Date                   Time    __________________________________________ Nurse's Signature

## 2011-12-08 NOTE — Progress Notes (Signed)
   [facility]    OFFICE PROGRESS NOTE   INTERVAL HISTORY:   He returns as scheduled. He completed another cycle of chemotherapy on 11/24/2011. He tolerated the chemotherapy well. No nausea, mouth sores, or diarrhea. Cold sensitivity lasting 4-5 days and has resolved. No neuropathy symptoms today. No complaint.  Objective:  Vital signs in last 24 hours:  Blood pressure 154/82, pulse 86, temperature 98.4 F (36.9 C), temperature source Oral, resp. rate 18, height 6' 3.5" (1.918 m), weight 166 lb 8 oz (75.524 kg).    HEENT: No thrush or ulcer Resp: Lungs clear bilaterally Cardio: Regular rate and rhythm GI: Nontender, no hepatosplenomegaly Vascular: No leg edema Neuro: Very mild decrease in vibratory sense at the fingertips bilaterally    Portacath/PICC-without erythema  Lab Results:  Lab Results  Component Value Date   WBC 3.9* 12/08/2011   HGB 12.4* 12/08/2011   HCT 34.8* 12/08/2011   MCV 95.9 12/08/2011   PLT 72* 12/08/2011   ANC 1.8   Medications: I have reviewed the patient's current medications.  Assessment/Plan: 1. 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 Tumor microsatellite unstable, K-ras wild type. Adjuvant FOLFOX chemotherapy initiated on 07/14/2011. 2. Microcytic anemia. Likely secondary to iron deficiency. Improved. He continues oral iron. 3. Insulin-dependent diabetes. 4. Glaucoma. 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 He has seen a Runner, broadcasting/film/video. He has been contacted with updated information regarding screening recommendations. 6. Status post Port-A-Cath placement 07/01/2011. 7. Thrombocytopenia secondary to chemotherapy. Oxaliplatin was held with cycle 3. Oxaliplatin was resumed with cycle 4 at a 25% dose  reduction. Oxaliplatin was further dose reduced beginning with cycle 6 due to cytopenias. The oxaliplatin was held with cycle 8. 8. Question early oxaliplatin neuropathy with decreased vibratory sense over the fingertips per tuning fork exam. Stable. 9. History of neutropenia secondary to chemotherapy. The neutrophil count is in normal range today.   Disposition:  He appears stable. The plan is to proceed with cycle 10 of adjuvant FOLFOX today. Oxaliplatin will be held from the chemotherapy regimen again today secondary to thrombocytopenia. He will return for an office visit and chemotherapy in 2 weeks.   Thornton Papas, MD  12/08/2011  9:22 AM

## 2011-12-08 NOTE — Telephone Encounter (Signed)
appts made and printed for pt pt aware tht tx will be added on 9/19

## 2011-12-10 ENCOUNTER — Ambulatory Visit (HOSPITAL_BASED_OUTPATIENT_CLINIC_OR_DEPARTMENT_OTHER): Payer: BC Managed Care – PPO

## 2011-12-10 VITALS — BP 133/70 | HR 95 | Temp 98.9°F | Resp 20

## 2011-12-10 DIAGNOSIS — Z452 Encounter for adjustment and management of vascular access device: Secondary | ICD-10-CM

## 2011-12-10 DIAGNOSIS — C182 Malignant neoplasm of ascending colon: Secondary | ICD-10-CM

## 2011-12-10 MED ORDER — HEPARIN SOD (PORK) LOCK FLUSH 100 UNIT/ML IV SOLN
500.0000 [IU] | Freq: Once | INTRAVENOUS | Status: AC | PRN
  Administered 2011-12-10: 500 [IU]
  Filled 2011-12-10: qty 5

## 2011-12-10 MED ORDER — SODIUM CHLORIDE 0.9 % IJ SOLN
10.0000 mL | INTRAMUSCULAR | Status: DC | PRN
  Administered 2011-12-10: 10 mL
  Filled 2011-12-10: qty 10

## 2011-12-21 ENCOUNTER — Other Ambulatory Visit: Payer: Self-pay | Admitting: Oncology

## 2011-12-22 ENCOUNTER — Other Ambulatory Visit (HOSPITAL_BASED_OUTPATIENT_CLINIC_OR_DEPARTMENT_OTHER): Payer: BC Managed Care – PPO | Admitting: Lab

## 2011-12-22 ENCOUNTER — Ambulatory Visit (HOSPITAL_BASED_OUTPATIENT_CLINIC_OR_DEPARTMENT_OTHER): Payer: BC Managed Care – PPO

## 2011-12-22 ENCOUNTER — Ambulatory Visit (HOSPITAL_BASED_OUTPATIENT_CLINIC_OR_DEPARTMENT_OTHER): Payer: BC Managed Care – PPO | Admitting: Nurse Practitioner

## 2011-12-22 VITALS — BP 153/82 | HR 68 | Temp 98.3°F | Resp 18 | Ht 75.5 in | Wt 168.0 lb

## 2011-12-22 DIAGNOSIS — E119 Type 2 diabetes mellitus without complications: Secondary | ICD-10-CM

## 2011-12-22 DIAGNOSIS — C182 Malignant neoplasm of ascending colon: Secondary | ICD-10-CM

## 2011-12-22 DIAGNOSIS — D509 Iron deficiency anemia, unspecified: Secondary | ICD-10-CM

## 2011-12-22 DIAGNOSIS — D696 Thrombocytopenia, unspecified: Secondary | ICD-10-CM

## 2011-12-22 DIAGNOSIS — Z5111 Encounter for antineoplastic chemotherapy: Secondary | ICD-10-CM

## 2011-12-22 LAB — CBC WITH DIFFERENTIAL/PLATELET
Basophils Absolute: 0 10*3/uL (ref 0.0–0.1)
Eosinophils Absolute: 0.1 10*3/uL (ref 0.0–0.5)
HGB: 12.1 g/dL — ABNORMAL LOW (ref 13.0–17.1)
MCV: 96.1 fL (ref 79.3–98.0)
MONO#: 0.7 10*3/uL (ref 0.1–0.9)
MONO%: 14 % (ref 0.0–14.0)
NEUT#: 2.9 10*3/uL (ref 1.5–6.5)
RDW: 14.5 % (ref 11.0–14.6)

## 2011-12-22 LAB — COMPREHENSIVE METABOLIC PANEL (CC13)
ALT: 58 U/L — ABNORMAL HIGH (ref 0–55)
AST: 64 U/L — ABNORMAL HIGH (ref 5–34)
Alkaline Phosphatase: 123 U/L (ref 40–150)
BUN: 13 mg/dL (ref 7.0–26.0)
CO2: 24 mEq/L (ref 22–29)
Chloride: 107 mEq/L (ref 98–107)
Glucose: 104 mg/dl — ABNORMAL HIGH (ref 70–99)
Potassium: 3.9 mEq/L (ref 3.5–5.1)
Total Protein: 6.5 g/dL (ref 6.4–8.3)

## 2011-12-22 MED ORDER — FLUOROURACIL CHEMO INJECTION 5 GM/100ML
2400.0000 mg/m2 | INTRAVENOUS | Status: DC
  Administered 2011-12-22: 4750 mg via INTRAVENOUS
  Filled 2011-12-22: qty 95

## 2011-12-22 MED ORDER — FLUOROURACIL CHEMO INJECTION 2.5 GM/50ML
400.0000 mg/m2 | Freq: Once | INTRAVENOUS | Status: AC
  Administered 2011-12-22: 800 mg via INTRAVENOUS
  Filled 2011-12-22: qty 16

## 2011-12-22 MED ORDER — LEUCOVORIN CALCIUM INJECTION 350 MG
400.0000 mg/m2 | Freq: Once | INTRAVENOUS | Status: AC
  Administered 2011-12-22: 788 mg via INTRAVENOUS
  Filled 2011-12-22: qty 39.4

## 2011-12-22 NOTE — Progress Notes (Signed)
OFFICE PROGRESS NOTE  Interval history:  Greg Robertson returns as scheduled. He completed cycle 10 of FOLFOX on 12/08/2011. Oxaliplatin was held due to thrombocytopenia. He overall feels well. No nausea or vomiting. No mouth sores. No diarrhea. He denies neuropathy symptoms.   Objective: Blood pressure 153/82, pulse 68, temperature 98.3 F (36.8 C), temperature source Oral, resp. rate 18, height 6' 3.5" (1.918 m), weight 168 lb (76.204 kg).  Oropharynx is without thrush or ulceration. Lungs are clear. Regular cardiac rhythm. Port-A-Cath site is without erythema. Abdomen is soft and nontender. No organomegaly. Extremities are without edema. Calves soft and nontender. Mild decrease in vibratory sense over the fingertips per tuning fork exam.  Lab Results: Lab Results  Component Value Date   WBC 5.1 12/22/2011   HGB 12.1* 12/22/2011   HCT 34.1* 12/22/2011   MCV 96.1 12/22/2011   PLT 72* 12/22/2011    Chemistry:    Chemistry      Component Value Date/Time   NA 135 12/08/2011 0844   K 4.1 12/08/2011 0844   CL 101 12/08/2011 0844   CO2 27 12/08/2011 0844   BUN 17 12/08/2011 0844   CREATININE 0.80 12/08/2011 0844      Component Value Date/Time   CALCIUM 9.4 12/08/2011 0844   ALKPHOS 130* 12/08/2011 0844   AST 33 12/08/2011 0844   ALT 30 12/08/2011 0844   BILITOT 0.4 12/08/2011 0844       Studies/Results: No results found.  Medications: I have reviewed the patient's current medications.  Assessment/Plan:  1. 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 Tumor microsatellite unstable, K-ras wild type. Adjuvant FOLFOX chemotherapy initiated on 07/14/2011. 2. Microcytic anemia. Likely secondary to iron deficiency. Improved. He continues oral iron. 3. Insulin-dependent diabetes. 4. Glaucoma. 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 He has seen a Runner, broadcasting/film/video. He has been contacted with updated information regarding screening recommendations. 6. Status post Port-A-Cath placement 07/01/2011. 7. Thrombocytopenia secondary to chemotherapy. Oxaliplatin was held with cycle 3. Oxaliplatin was resumed with cycle 4 at a 25% dose reduction. Oxaliplatin was further dose reduced beginning with cycle 6 due to cytopenias. The oxaliplatin was held with cycle 8 and cycle 10. 8. Question early oxaliplatin neuropathy with decreased vibratory sense over the fingertips per tuning fork exam. Stable. 9. History of neutropenia secondary to chemotherapy. The neutrophil count is in normal range today.  Disposition-Mr. Stcyr appears stable. Plan to proceed with cycle 11 adjuvant FOLFOX chemotherapy today as scheduled. Oxaliplatin will be held due to thrombocytopenia. He will return for a followup visit and cycle 12 in 2 weeks. He will contact the office in the interim with any problems.  Plan reviewed with Dr. Truett Perna.  Greg Robertson ANP/GNP-BC

## 2011-12-22 NOTE — Patient Instructions (Signed)
[facility] Discharge Instructions for Patients Receiving Chemotherapy  Today you received the following chemotherapy agents leucovorn  To help prevent nausea and vomiting after your treatment, we encourage you to take your nausea medication  As directed   If you develop nausea and vomiting that is not controlled by your nausea medication, call the clinic. If it is after clinic hours your family physician or the after hours number for the clinic or go to the Emergency Department.   BELOW ARE SYMPTOMS THAT SHOULD BE REPORTED IMMEDIATELY:  *FEVER GREATER THAN 100.5 F  *CHILLS WITH OR WITHOUT FEVER  NAUSEA AND VOMITING THAT IS NOT CONTROLLED WITH YOUR NAUSEA MEDICATION  *UNUSUAL SHORTNESS OF BREATH  *UNUSUAL BRUISING OR BLEEDING  TENDERNESS IN MOUTH AND THROAT WITH OR WITHOUT PRESENCE OF ULCERS  *URINARY PROBLEMS  *BOWEL PROBLEMS  UNUSUAL RASH Items with * indicate a potential emergency and should be followed up as soon as possible.  One of the nurses will contact you 24 hours after your treatment. Please let the nurse know about any problems that you may have experienced. Feel free to call the clinic you have any questions or concerns. The clinic phone number is [phone].   I have been informed and understand all the instructions given to me. I know to contact the clinic, my physician, or go to the Emergency Department if any problems should occur. I do not have any questions at this time, but understand that I may call the clinic during office hours   should I have any questions or need assistance in obtaining follow up care.    __________________________________________  _____________  __________ Signature of Patient or Authorized Representative            Date                   Time    __________________________________________ Nurse's Signature

## 2011-12-24 ENCOUNTER — Ambulatory Visit (HOSPITAL_BASED_OUTPATIENT_CLINIC_OR_DEPARTMENT_OTHER): Payer: BC Managed Care – PPO

## 2011-12-24 VITALS — BP 147/75 | HR 74 | Temp 97.4°F | Resp 18

## 2011-12-24 DIAGNOSIS — Z452 Encounter for adjustment and management of vascular access device: Secondary | ICD-10-CM

## 2011-12-24 DIAGNOSIS — C182 Malignant neoplasm of ascending colon: Secondary | ICD-10-CM

## 2011-12-24 MED ORDER — HEPARIN SOD (PORK) LOCK FLUSH 100 UNIT/ML IV SOLN
500.0000 [IU] | Freq: Once | INTRAVENOUS | Status: AC | PRN
  Administered 2011-12-24: 500 [IU]
  Filled 2011-12-24: qty 5

## 2011-12-24 MED ORDER — SODIUM CHLORIDE 0.9 % IJ SOLN
10.0000 mL | INTRAMUSCULAR | Status: DC | PRN
  Administered 2011-12-24: 10 mL
  Filled 2011-12-24: qty 10

## 2012-01-04 ENCOUNTER — Other Ambulatory Visit: Payer: Self-pay | Admitting: Oncology

## 2012-01-05 ENCOUNTER — Other Ambulatory Visit (HOSPITAL_BASED_OUTPATIENT_CLINIC_OR_DEPARTMENT_OTHER): Payer: BC Managed Care – PPO

## 2012-01-05 ENCOUNTER — Ambulatory Visit (HOSPITAL_BASED_OUTPATIENT_CLINIC_OR_DEPARTMENT_OTHER): Payer: BC Managed Care – PPO

## 2012-01-05 ENCOUNTER — Telehealth: Payer: Self-pay | Admitting: Oncology

## 2012-01-05 ENCOUNTER — Ambulatory Visit (HOSPITAL_BASED_OUTPATIENT_CLINIC_OR_DEPARTMENT_OTHER): Payer: BC Managed Care – PPO | Admitting: Nurse Practitioner

## 2012-01-05 VITALS — BP 136/76 | HR 70 | Temp 97.6°F | Resp 20 | Ht 75.5 in | Wt 168.8 lb

## 2012-01-05 DIAGNOSIS — C182 Malignant neoplasm of ascending colon: Secondary | ICD-10-CM

## 2012-01-05 DIAGNOSIS — Z1509 Genetic susceptibility to other malignant neoplasm: Secondary | ICD-10-CM

## 2012-01-05 DIAGNOSIS — D6959 Other secondary thrombocytopenia: Secondary | ICD-10-CM

## 2012-01-05 DIAGNOSIS — D509 Iron deficiency anemia, unspecified: Secondary | ICD-10-CM

## 2012-01-05 DIAGNOSIS — Z5111 Encounter for antineoplastic chemotherapy: Secondary | ICD-10-CM

## 2012-01-05 LAB — COMPREHENSIVE METABOLIC PANEL (CC13)
ALT: 21 U/L (ref 0–55)
AST: 24 U/L (ref 5–34)
Alkaline Phosphatase: 122 U/L (ref 40–150)
Sodium: 138 mEq/L (ref 136–145)
Total Bilirubin: 0.5 mg/dL (ref 0.20–1.20)
Total Protein: 6.2 g/dL — ABNORMAL LOW (ref 6.4–8.3)

## 2012-01-05 LAB — CBC WITH DIFFERENTIAL/PLATELET
BASO%: 0.2 % (ref 0.0–2.0)
Basophils Absolute: 0 10*3/uL (ref 0.0–0.1)
EOS%: 3 % (ref 0.0–7.0)
HGB: 12.6 g/dL — ABNORMAL LOW (ref 13.0–17.1)
MCH: 34.8 pg — ABNORMAL HIGH (ref 27.2–33.4)
MCHC: 35.8 g/dL (ref 32.0–36.0)
RDW: 14.3 % (ref 11.0–14.6)
lymph#: 1.1 10*3/uL (ref 0.9–3.3)

## 2012-01-05 MED ORDER — OXALIPLATIN CHEMO INJECTION 100 MG/20ML
58.0000 mg/m2 | Freq: Once | INTRAVENOUS | Status: AC
  Administered 2012-01-05: 115 mg via INTRAVENOUS
  Filled 2012-01-05: qty 23

## 2012-01-05 MED ORDER — SODIUM CHLORIDE 0.9 % IJ SOLN
10.0000 mL | INTRAMUSCULAR | Status: DC | PRN
  Filled 2012-01-05: qty 10

## 2012-01-05 MED ORDER — ONDANSETRON 8 MG/50ML IVPB (CHCC)
8.0000 mg | Freq: Once | INTRAVENOUS | Status: AC
  Administered 2012-01-05: 8 mg via INTRAVENOUS

## 2012-01-05 MED ORDER — FLUOROURACIL CHEMO INJECTION 2.5 GM/50ML
400.0000 mg/m2 | Freq: Once | INTRAVENOUS | Status: AC
  Administered 2012-01-05: 800 mg via INTRAVENOUS
  Filled 2012-01-05: qty 16

## 2012-01-05 MED ORDER — DEXTROSE 5 % IV SOLN
Freq: Once | INTRAVENOUS | Status: AC
  Administered 2012-01-05: 11:00:00 via INTRAVENOUS

## 2012-01-05 MED ORDER — LEUCOVORIN CALCIUM INJECTION 350 MG
400.0000 mg/m2 | Freq: Once | INTRAVENOUS | Status: AC
  Administered 2012-01-05: 788 mg via INTRAVENOUS
  Filled 2012-01-05: qty 39.4

## 2012-01-05 MED ORDER — HEPARIN SOD (PORK) LOCK FLUSH 100 UNIT/ML IV SOLN
500.0000 [IU] | Freq: Once | INTRAVENOUS | Status: DC | PRN
  Filled 2012-01-05: qty 5

## 2012-01-05 MED ORDER — DEXAMETHASONE SODIUM PHOSPHATE 10 MG/ML IJ SOLN
10.0000 mg | Freq: Once | INTRAMUSCULAR | Status: AC
  Administered 2012-01-05: 10 mg via INTRAVENOUS

## 2012-01-05 MED ORDER — SODIUM CHLORIDE 0.9 % IV SOLN
2400.0000 mg/m2 | INTRAVENOUS | Status: DC
  Administered 2012-01-05: 4750 mg via INTRAVENOUS
  Filled 2012-01-05: qty 95

## 2012-01-05 NOTE — Progress Notes (Signed)
OFFICE PROGRESS NOTE  Interval history:  Greg Robertson returns as scheduled. He completed cycle 11 of FOLFOX on 12/22/2011. Oxaliplatin was held due to thrombocytopenia.  He feels well. No nausea or vomiting. No mouth sores. No diarrhea. No hand or foot pain or redness. He denies neuropathy symptoms.   Objective: Blood pressure 136/76, pulse 70, temperature 97.6 F (36.4 C), temperature source Oral, resp. rate 20, height 6' 3.5" (1.918 m), weight 168 lb 12.8 oz (76.567 kg).  Oropharynx is without thrush or ulceration. Lungs are clear. Regular cardiac rhythm. Port-A-Cath site is without erythema. Abdomen is soft and nontender. No organomegaly. Extremities are without edema. Calves are soft and nontender. Vibratory sense intact over the fingertips per tuning fork exam.  Lab Results: Lab Results  Component Value Date   WBC 5.0 01/05/2012   HGB 12.6* 01/05/2012   HCT 35.2* 01/05/2012   MCV 97.2 01/05/2012   PLT 97* 01/05/2012    Chemistry:    Chemistry      Component Value Date/Time   NA 141 12/22/2011 0857   NA 135 12/08/2011 0844   K 3.9 12/22/2011 0857   K 4.1 12/08/2011 0844   CL 107 12/22/2011 0857   CL 101 12/08/2011 0844   CO2 24 12/22/2011 0857   CO2 27 12/08/2011 0844   BUN 13.0 12/22/2011 0857   BUN 17 12/08/2011 0844   CREATININE 0.9 12/22/2011 0857   CREATININE 0.80 12/08/2011 0844      Component Value Date/Time   CALCIUM 9.4 12/22/2011 0857   CALCIUM 9.4 12/08/2011 0844   ALKPHOS 123 12/22/2011 0857   ALKPHOS 130* 12/08/2011 0844   AST 64* 12/22/2011 0857   AST 33 12/08/2011 0844   ALT 58* 12/22/2011 0857   ALT 30 12/08/2011 0844   BILITOT 0.40 12/22/2011 0857   BILITOT 0.4 12/08/2011 0844       Studies/Results: No results found.  Medications: I have reviewed the patient's current medications.  Assessment/Plan:  1. 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 Tumor microsatellite unstable, K-ras wild  type. Adjuvant FOLFOX chemotherapy initiated on 07/14/2011. 2. Microcytic anemia. Likely secondary to iron deficiency. Improved. He continues oral iron. 3. Insulin-dependent diabetes. 4. Glaucoma. 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 He has seen a Runner, broadcasting/film/video. He has been contacted with updated information regarding screening recommendations. 6. Status post Port-A-Cath placement 07/01/2011. 7. Thrombocytopenia secondary to chemotherapy. Oxaliplatin was held with cycle 3. Oxaliplatin was resumed with cycle 4 at a 25% dose reduction. Oxaliplatin was further dose reduced beginning with cycle 6 due to cytopenias. The oxaliplatin was held with cycle 8, cycle 10 and cycle 11. 8. Question early oxaliplatin neuropathy with decreased vibratory sense over the fingertips per tuning fork exam. Vibratory sense was intact over the fingertips on exam today. 9. History of neutropenia secondary to chemotherapy. The neutrophil count is in normal range today.  Disposition-Greg Robertson appears stable. Plan to proceed with the 12th and final cycle of adjuvant FOLFOX chemotherapy today as scheduled. He will return for a followup visit in one month. He will contact the office in the interim with any problems.  Plan reviewed with Dr. Truett Perna.  Lonna Cobb ANP/GNP-BC

## 2012-01-05 NOTE — Telephone Encounter (Signed)
Printed and gv appt to pt. °

## 2012-01-05 NOTE — Patient Instructions (Signed)
[facility] Discharge Instructions for Patients Receiving Chemotherapy  Today you received the following chemotherapy agents Leucovorin, Oxaliplatin and 5FU.  To help prevent nausea and vomiting after your treatment, we encourage you to take your nausea medication as prescribed.   If you develop nausea and vomiting that is not controlled by your nausea medication, call the clinic. If it is after clinic hours your family physician or the after hours number for the clinic or go to the Emergency Department.   BELOW ARE SYMPTOMS THAT SHOULD BE REPORTED IMMEDIATELY:  *FEVER GREATER THAN 100.5 F  *CHILLS WITH OR WITHOUT FEVER  NAUSEA AND VOMITING THAT IS NOT CONTROLLED WITH YOUR NAUSEA MEDICATION  *UNUSUAL SHORTNESS OF BREATH  *UNUSUAL BRUISING OR BLEEDING  TENDERNESS IN MOUTH AND THROAT WITH OR WITHOUT PRESENCE OF ULCERS  *URINARY PROBLEMS  *BOWEL PROBLEMS  UNUSUAL RASH Items with * indicate a potential emergency and should be followed up as soon as possible.  One of the nurses will contact you 24 hours after your treatment. Please let the nurse know about any problems that you may have experienced. Feel free to call the clinic you have any questions or concerns. The clinic phone number is [phone].   I have been informed and understand all the instructions given to me. I know to contact the clinic, my physician, or go to the Emergency Department if any problems should occur. I do not have any questions at this time, but understand that I may call the clinic during office hours   should I have any questions or need assistance in obtaining follow up care.    __________________________________________  _____________  __________ Signature of Patient or Authorized Representative            Date                   Time    __________________________________________ Nurse's Signature

## 2012-01-06 LAB — CEA: CEA: 3 ng/mL (ref 0.0–5.0)

## 2012-01-07 ENCOUNTER — Ambulatory Visit (HOSPITAL_BASED_OUTPATIENT_CLINIC_OR_DEPARTMENT_OTHER): Payer: BC Managed Care – PPO

## 2012-01-07 VITALS — BP 154/76 | HR 76 | Temp 98.1°F | Resp 18

## 2012-01-07 DIAGNOSIS — C182 Malignant neoplasm of ascending colon: Secondary | ICD-10-CM

## 2012-01-07 DIAGNOSIS — Z452 Encounter for adjustment and management of vascular access device: Secondary | ICD-10-CM

## 2012-01-07 MED ORDER — HEPARIN SOD (PORK) LOCK FLUSH 100 UNIT/ML IV SOLN
500.0000 [IU] | Freq: Once | INTRAVENOUS | Status: AC | PRN
  Administered 2012-01-07: 500 [IU]
  Filled 2012-01-07: qty 5

## 2012-01-07 MED ORDER — SODIUM CHLORIDE 0.9 % IJ SOLN
10.0000 mL | INTRAMUSCULAR | Status: DC | PRN
  Administered 2012-01-07: 10 mL
  Filled 2012-01-07: qty 10

## 2012-01-09 ENCOUNTER — Other Ambulatory Visit: Payer: Self-pay | Admitting: Certified Registered Nurse Anesthetist

## 2012-02-07 ENCOUNTER — Other Ambulatory Visit (HOSPITAL_BASED_OUTPATIENT_CLINIC_OR_DEPARTMENT_OTHER): Payer: BC Managed Care – PPO | Admitting: Lab

## 2012-02-07 ENCOUNTER — Ambulatory Visit (HOSPITAL_BASED_OUTPATIENT_CLINIC_OR_DEPARTMENT_OTHER): Payer: BC Managed Care – PPO | Admitting: Oncology

## 2012-02-07 ENCOUNTER — Telehealth: Payer: Self-pay | Admitting: Oncology

## 2012-02-07 VITALS — BP 110/70 | HR 76 | Temp 97.5°F | Resp 18 | Ht 75.5 in | Wt 171.9 lb

## 2012-02-07 DIAGNOSIS — C182 Malignant neoplasm of ascending colon: Secondary | ICD-10-CM

## 2012-02-07 DIAGNOSIS — Z1509 Genetic susceptibility to other malignant neoplasm: Secondary | ICD-10-CM

## 2012-02-07 DIAGNOSIS — D6959 Other secondary thrombocytopenia: Secondary | ICD-10-CM

## 2012-02-07 LAB — CBC WITH DIFFERENTIAL/PLATELET
Basophils Absolute: 0 10*3/uL (ref 0.0–0.1)
Eosinophils Absolute: 0.1 10*3/uL (ref 0.0–0.5)
HGB: 13.2 g/dL (ref 13.0–17.1)
MCV: 97.9 fL (ref 79.3–98.0)
MONO#: 0.6 10*3/uL (ref 0.1–0.9)
MONO%: 14.9 % — ABNORMAL HIGH (ref 0.0–14.0)
NEUT#: 1.8 10*3/uL (ref 1.5–6.5)
RBC: 3.84 10*6/uL — ABNORMAL LOW (ref 4.20–5.82)
RDW: 12.6 % (ref 11.0–14.6)
WBC: 3.9 10*3/uL — ABNORMAL LOW (ref 4.0–10.3)
lymph#: 1.4 10*3/uL (ref 0.9–3.3)
nRBC: 0 % (ref 0–0)

## 2012-02-07 NOTE — Progress Notes (Signed)
   [facility]    OFFICE PROGRESS NOTE   INTERVAL HISTORY:   He returns as scheduled. He completed a final cycle of FOLFOX chemotherapy on 01/05/2012. He denies mouth sores, nausea, diarrhea, and neuropathy symptoms. No complaint.  Objective:  Vital signs in last 24 hours:  Blood pressure 110/70, pulse 76, temperature 97.5 F (36.4 C), temperature source Oral, resp. rate 18, height 6' 3.5" (1.918 m), weight 171 lb 14.4 oz (77.973 kg).    HEENT: No thrush or ulcers Lymphatics: No cervical, supraclavicular, or axillary nodes Resp: Lungs clear bilaterally Cardio: Regular rate and rhythm GI: Nontender, no hepatomegaly Vascular: No leg edema  Portacath/PICC-without erythema  Lab Results:  Lab Results  Component Value Date   WBC 3.9* 02/07/2012   HGB 13.2 02/07/2012   HCT 37.6* 02/07/2012   MCV 97.9 02/07/2012   PLT 93* 02/07/2012   ANC 1.8  CEA on 01/05/2012-3.0  Medications: I have reviewed the patient's current medications.  Assessment/Plan: 1. 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 Tumor microsatellite unstable, K-ras wild type. Adjuvant FOLFOX chemotherapy initiated on 07/14/2011. Cycle 12 given on 01/05/2012. 2. Microcytic anemia. Likely secondary to iron deficiency. Resolved, he will discontinue iron 3. Insulin-dependent diabetes. 4. Glaucoma. 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 He has seen a Runner, broadcasting/film/video. He has been contacted with updated information regarding screening recommendations. 6. Status post Port-A-Cath placement 07/01/2011. 7. Thrombocytopenia secondary to chemotherapy. Oxaliplatin was held with cycle 3. Oxaliplatin was resumed with cycle 4 at a 25% dose reduction. Oxaliplatin was further dose  reduced beginning with cycle 6 due to cytopenias. The oxaliplatin was held with cycle 8, cycle 10 and cycle 11. 8. History of neutropenia secondary to chemotherapy. The neutrophil count is in normal range today.   Disposition:  He has completed the planned course of adjuvant chemotherapy. Mr. Brooks will return for a CBC in 2 weeks to followup on the low platelet count. We will refer him to Dr. Michaell Cowing for removal of the Port-A-Cath. He will be scheduled for the year 1 surveillance CT scans and an office visit in late December of 2013. We will refer him to Dr. Arlyce Dice for a one-year surveillance colonoscopy.   Thornton Papas, MD  02/07/2012  5:46 PM

## 2012-02-07 NOTE — Telephone Encounter (Signed)
Gave pt appt for lab and CT on 12/20th , gave pt oral contrast ,NPO 4 hours prior to CT, then pt will see ML. Called Dr. Michaell Cowing office left message with OR scheduler for portacath removal, waiting for a call back

## 2012-02-08 ENCOUNTER — Telehealth: Payer: Self-pay | Admitting: Oncology

## 2012-02-08 NOTE — Telephone Encounter (Signed)
Talked to patient, informed him that Greg Robertson called, carol has informed Dr. Michaell Cowing for portacath removal and pt will be called. Instructed patient to call us if he don't hear from [facility] for his portacath removal, also gave him appt for 11/5 for lab draw

## 2012-02-08 NOTE — Telephone Encounter (Signed)
Talked to [facility] @ [facility], pt is on recall list for January 2014 with Dr. Arlyce Dice, their template is not open. Informed patient of pending appt with Dr. Arlyce Dice

## 2012-02-21 ENCOUNTER — Other Ambulatory Visit (HOSPITAL_BASED_OUTPATIENT_CLINIC_OR_DEPARTMENT_OTHER): Payer: BC Managed Care – PPO

## 2012-02-21 DIAGNOSIS — C182 Malignant neoplasm of ascending colon: Secondary | ICD-10-CM

## 2012-02-21 LAB — CBC WITH DIFFERENTIAL/PLATELET
BASO%: 0.7 % (ref 0.0–2.0)
EOS%: 4.8 % (ref 0.0–7.0)
HCT: 41 % (ref 38.4–49.9)
MCH: 34 pg — ABNORMAL HIGH (ref 27.2–33.4)
MCHC: 35.1 g/dL (ref 32.0–36.0)
NEUT%: 45.2 % (ref 39.0–75.0)
RBC: 4.24 10*6/uL (ref 4.20–5.82)
lymph#: 1.6 10*3/uL (ref 0.9–3.3)

## 2012-03-08 IMAGING — CR DG SHOULDER 2+V*L*
3 series · 3 of 3 positions shown · non-contrast
Comparison: None.

CLINICAL DATA: Fall

LEFT SHOULDER - 2+ VIEW

[view not recorded (1 of 3)]
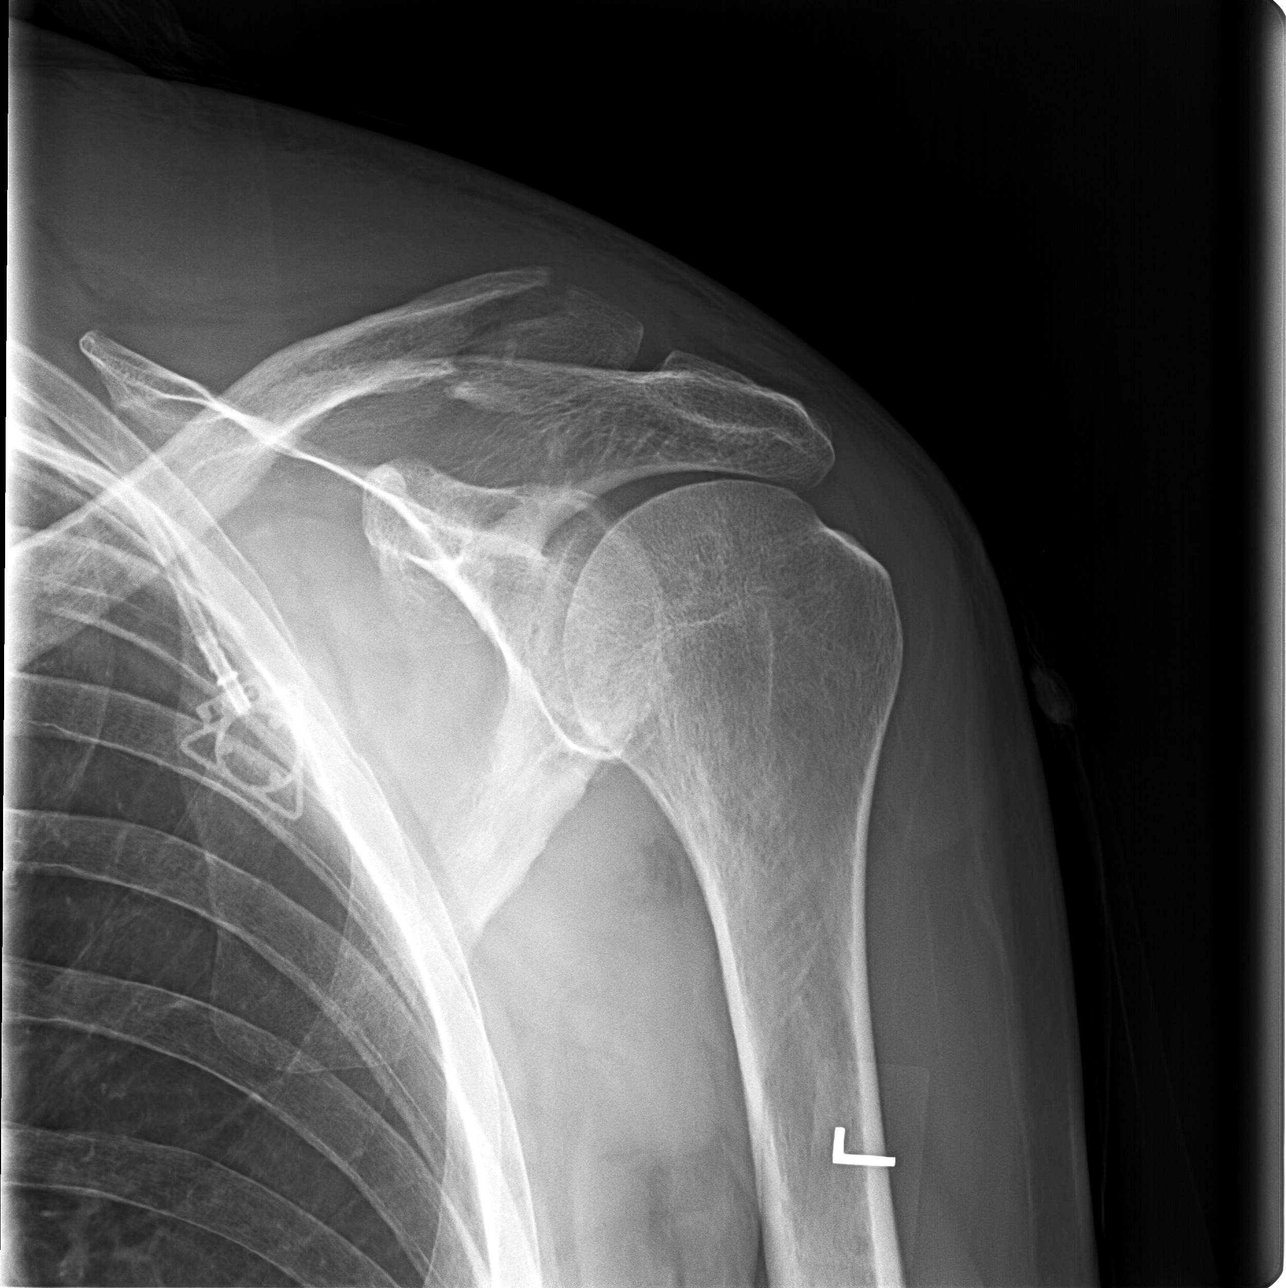

[view not recorded (2 of 3)]
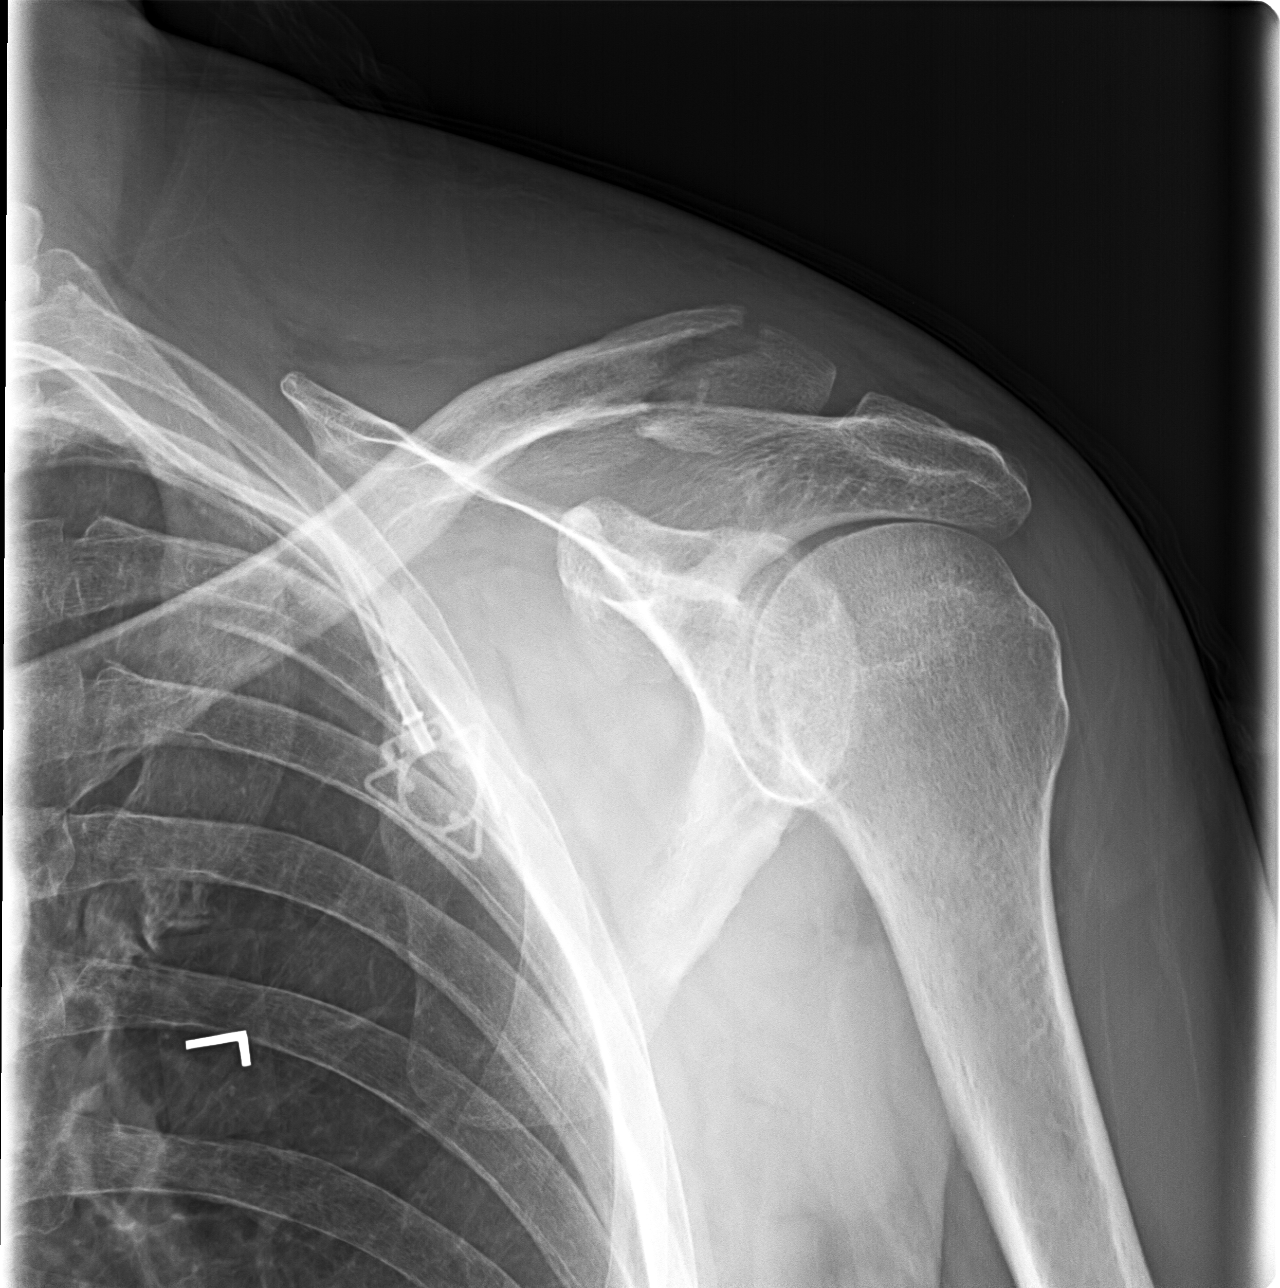

[view not recorded (3 of 3)]
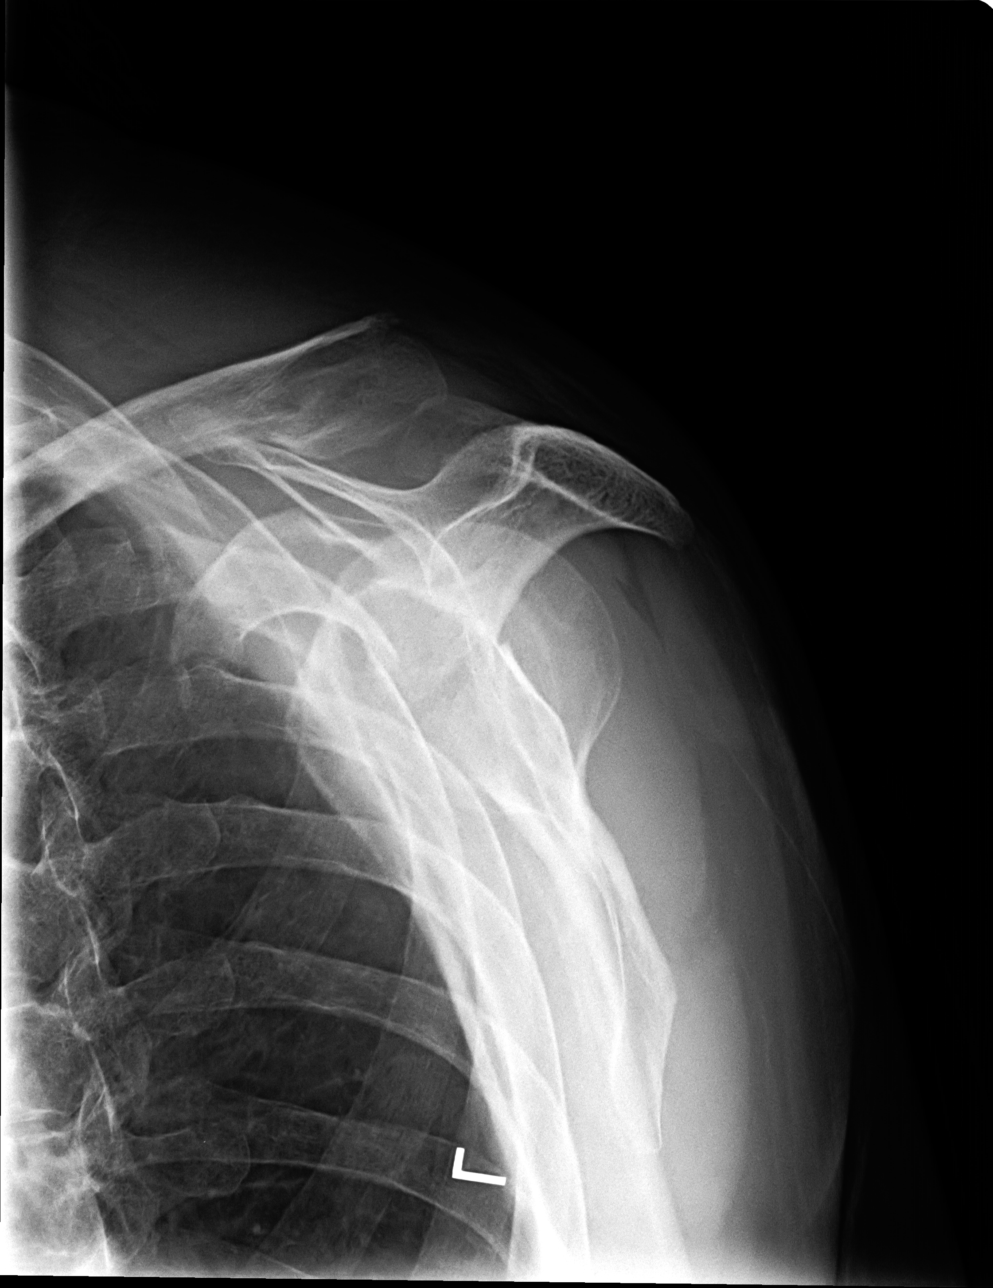

[3 of 3 positions shown; findings below may reference images not displayed]

FINDINGS: There is a fracture through the peripheral aspect of the
clavicle.  Slight upward angulation at the fracture site.  The
fracture site is somewhat hazy and subtle callus formation is noted
compatible with subacute age.  Scapula and humerus are intact.
Glenohumeral joint is anatomic.  Left sided Port-A-Cath is in
place.
IMPRESSION: Subacute peripheral left clavicle fracture.

## 2012-03-09 ENCOUNTER — Inpatient Hospital Stay (HOSPITAL_COMMUNITY)
Admission: EM | Admit: 2012-03-09 | Discharge: 2012-03-14 | DRG: 566 | Disposition: A | Discharge status: Home Health | Payer: BC Managed Care – PPO | Attending: Internal Medicine | Admitting: Internal Medicine

## 2012-03-09 ENCOUNTER — Emergency Department (HOSPITAL_COMMUNITY): Payer: BC Managed Care – PPO

## 2012-03-09 ENCOUNTER — Encounter (HOSPITAL_COMMUNITY): Payer: Self-pay

## 2012-03-09 ENCOUNTER — Other Ambulatory Visit: Payer: Self-pay

## 2012-03-09 DIAGNOSIS — I5032 Chronic diastolic (congestive) heart failure: Secondary | ICD-10-CM | POA: Diagnosis present

## 2012-03-09 DIAGNOSIS — N179 Acute kidney failure, unspecified: Secondary | ICD-10-CM | POA: Diagnosis present

## 2012-03-09 DIAGNOSIS — I248 Other forms of acute ischemic heart disease: Secondary | ICD-10-CM

## 2012-03-09 DIAGNOSIS — D696 Thrombocytopenia, unspecified: Secondary | ICD-10-CM | POA: Diagnosis present

## 2012-03-09 DIAGNOSIS — C182 Malignant neoplasm of ascending colon: Secondary | ICD-10-CM | POA: Diagnosis present

## 2012-03-09 DIAGNOSIS — IMO0001 Reserved for inherently not codable concepts without codable children: Secondary | ICD-10-CM

## 2012-03-09 DIAGNOSIS — E871 Hypo-osmolality and hyponatremia: Secondary | ICD-10-CM | POA: Diagnosis present

## 2012-03-09 DIAGNOSIS — I2489 Other forms of acute ischemic heart disease: Secondary | ICD-10-CM | POA: Diagnosis present

## 2012-03-09 DIAGNOSIS — S42002A Fracture of unspecified part of left clavicle, initial encounter for closed fracture: Secondary | ICD-10-CM | POA: Diagnosis present

## 2012-03-09 DIAGNOSIS — D649 Anemia, unspecified: Secondary | ICD-10-CM | POA: Diagnosis present

## 2012-03-09 DIAGNOSIS — E1310 Other specified diabetes mellitus with ketoacidosis without coma: Principal | ICD-10-CM | POA: Diagnosis present

## 2012-03-09 DIAGNOSIS — R195 Other fecal abnormalities: Secondary | ICD-10-CM

## 2012-03-09 DIAGNOSIS — I4891 Unspecified atrial fibrillation: Secondary | ICD-10-CM | POA: Diagnosis present

## 2012-03-09 DIAGNOSIS — I4510 Unspecified right bundle-branch block: Secondary | ICD-10-CM | POA: Diagnosis present

## 2012-03-09 DIAGNOSIS — Z85038 Personal history of other malignant neoplasm of large intestine: Secondary | ICD-10-CM | POA: Diagnosis present

## 2012-03-09 DIAGNOSIS — E1110 Type 2 diabetes mellitus with ketoacidosis without coma: Secondary | ICD-10-CM | POA: Diagnosis present

## 2012-03-09 DIAGNOSIS — E876 Hypokalemia: Secondary | ICD-10-CM | POA: Diagnosis not present

## 2012-03-09 DIAGNOSIS — E860 Dehydration: Secondary | ICD-10-CM | POA: Diagnosis present

## 2012-03-09 DIAGNOSIS — I509 Heart failure, unspecified: Secondary | ICD-10-CM | POA: Diagnosis present

## 2012-03-09 DIAGNOSIS — I10 Essential (primary) hypertension: Secondary | ICD-10-CM | POA: Diagnosis present

## 2012-03-09 DIAGNOSIS — Z87891 Personal history of nicotine dependence: Secondary | ICD-10-CM

## 2012-03-09 DIAGNOSIS — E1065 Type 1 diabetes mellitus with hyperglycemia: Secondary | ICD-10-CM | POA: Diagnosis present

## 2012-03-09 DIAGNOSIS — Z1509 Genetic susceptibility to other malignant neoplasm: Secondary | ICD-10-CM

## 2012-03-09 DIAGNOSIS — I214 Non-ST elevation (NSTEMI) myocardial infarction: Secondary | ICD-10-CM | POA: Diagnosis present

## 2012-03-09 DIAGNOSIS — E875 Hyperkalemia: Secondary | ICD-10-CM | POA: Diagnosis present

## 2012-03-09 DIAGNOSIS — E1159 Type 2 diabetes mellitus with other circulatory complications: Secondary | ICD-10-CM

## 2012-03-09 LAB — URINE MICROSCOPIC-ADD ON

## 2012-03-09 LAB — POCT I-STAT 3, ART BLOOD GAS (G3+)
Acid-base deficit: 24 mmol/L — ABNORMAL HIGH (ref 0.0–2.0)
Bicarbonate: 4.2 mEq/L — ABNORMAL LOW (ref 20.0–24.0)
pCO2 arterial: 14.2 mmHg — CL (ref 35.0–45.0)
pO2, Arterial: 110 mmHg — ABNORMAL HIGH (ref 80.0–100.0)

## 2012-03-09 LAB — URINALYSIS, ROUTINE W REFLEX MICROSCOPIC
Ketones, ur: 15 mg/dL — AB
Leukocytes, UA: NEGATIVE
Nitrite: NEGATIVE
Specific Gravity, Urine: 1.022 (ref 1.005–1.030)
Urobilinogen, UA: 0.2 mg/dL (ref 0.0–1.0)
pH: 5 (ref 5.0–8.0)

## 2012-03-09 LAB — CBC WITH DIFFERENTIAL/PLATELET
Basophils Absolute: 0 10*3/uL (ref 0.0–0.1)
Basophils Relative: 0 % (ref 0–1)
Eosinophils Absolute: 0 10*3/uL (ref 0.0–0.7)
MCH: 34.4 pg — ABNORMAL HIGH (ref 26.0–34.0)
MCHC: 32.2 g/dL (ref 30.0–36.0)
Neutro Abs: 9.9 10*3/uL — ABNORMAL HIGH (ref 1.7–7.7)
Neutrophils Relative %: 82 % — ABNORMAL HIGH (ref 43–77)
Platelets: 119 10*3/uL — ABNORMAL LOW (ref 150–400)
RDW: 12 % (ref 11.5–15.5)

## 2012-03-09 LAB — BASIC METABOLIC PANEL
Chloride: 69 mEq/L — ABNORMAL LOW (ref 96–112)
GFR calc Af Amer: 21 mL/min — ABNORMAL LOW (ref >90)
GFR calc non Af Amer: 18 mL/min — ABNORMAL LOW (ref >90)
Potassium: 7.5 mEq/L (ref 3.5–5.1)

## 2012-03-09 LAB — GLUCOSE, CAPILLARY
Glucose-Capillary: >600 mg/dL (ref 70–99)
Glucose-Capillary: >600 mg/dL (ref 70–99)

## 2012-03-09 IMAGING — CR DG CHEST 1V PORT
1 series · 1 of 1 positions shown · non-contrast
Comparison: [DATE]

CLINICAL DATA: Shortness of breath, diabetes

PORTABLE CHEST - 1 VIEW

[AP]
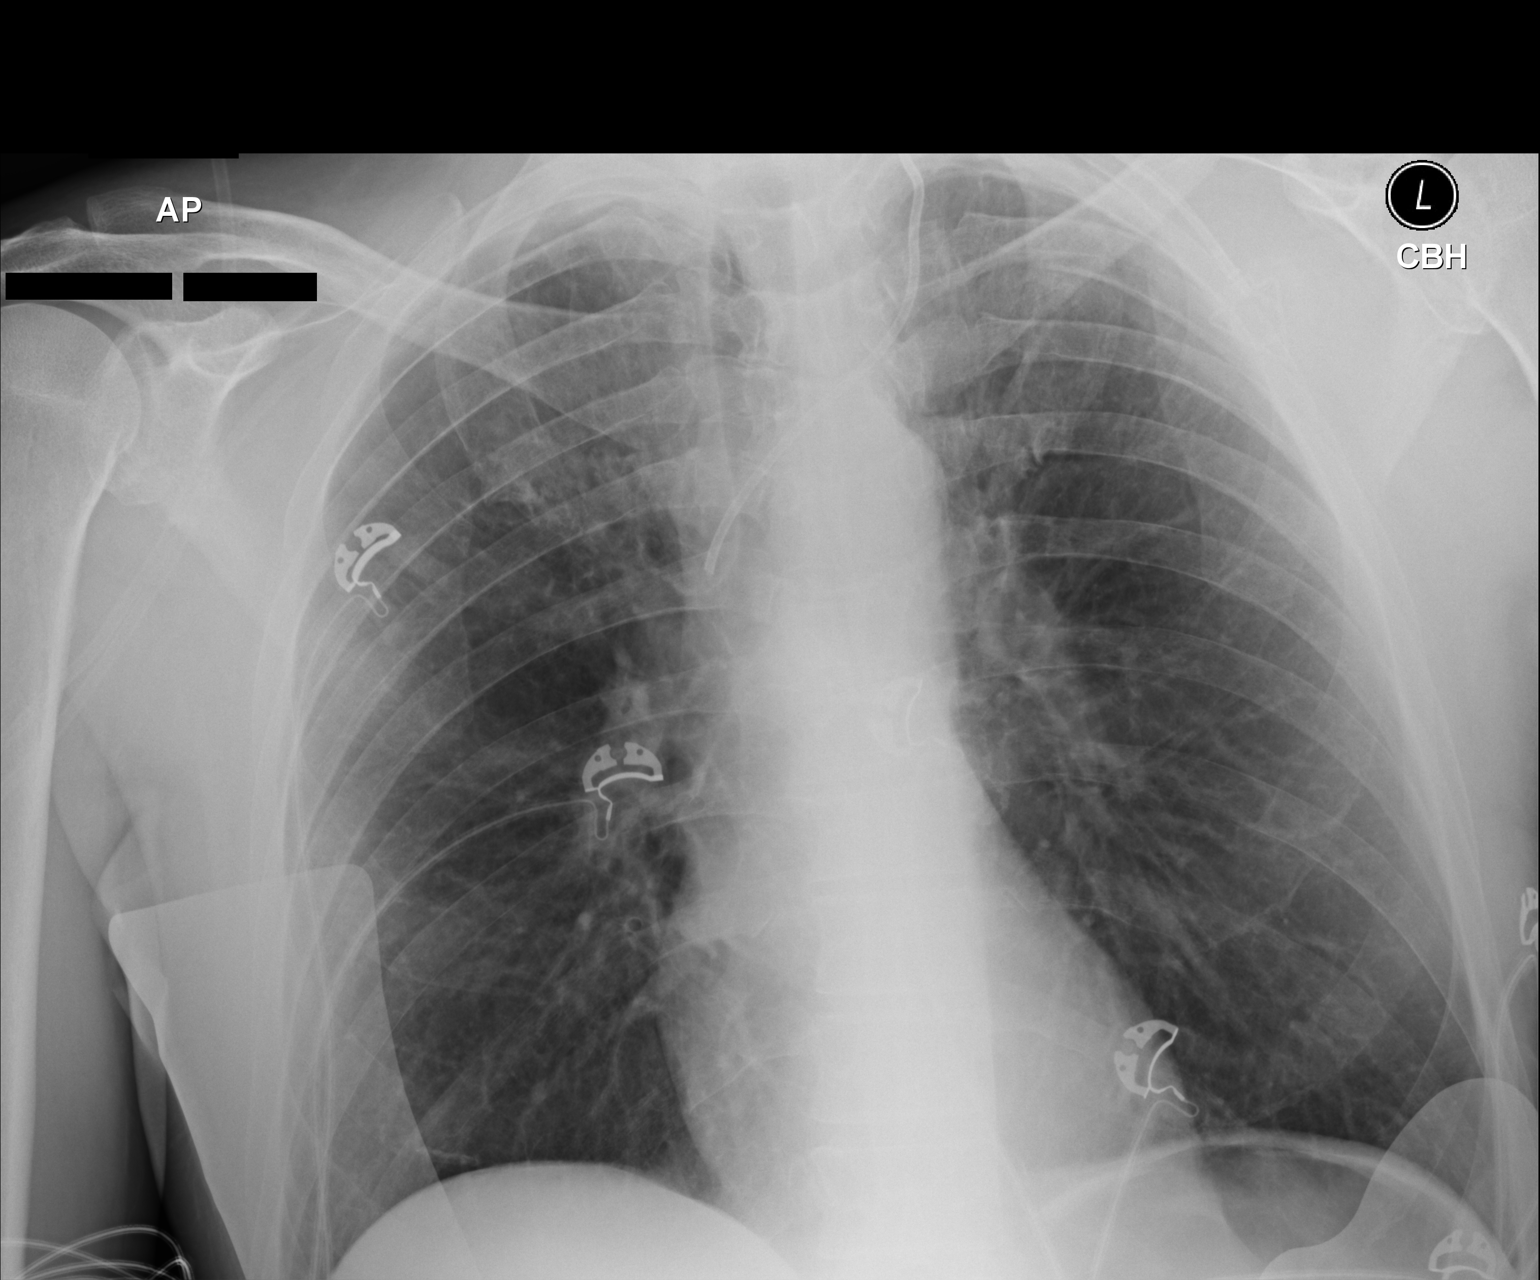

[1 of 1 positions shown; findings below may reference images not displayed]

FINDINGS: Left IJ port catheter extends to the left innominate
vein/SVC junction.  Lungs clear.  No definite effusion although the
right lateral costophrenic angle is excluded.  Heart size normal.
Regional bones unremarkable.
IMPRESSION: No acute disease

## 2012-03-09 MED ORDER — SODIUM CHLORIDE 0.9 % IV BOLUS (SEPSIS)
1000.0000 mL | Freq: Once | INTRAVENOUS | Status: AC
  Administered 2012-03-09: 1000 mL via INTRAVENOUS

## 2012-03-09 MED ORDER — SODIUM CHLORIDE 0.9 % IV SOLN
250.0000 mL | INTRAVENOUS | Status: DC | PRN
  Administered 2012-03-12: 250 mL via INTRAVENOUS

## 2012-03-09 MED ORDER — BRIMONIDINE TARTRATE-TIMOLOL 0.2-0.5 % OP SOLN: 1.0000 [drp] | Freq: Two times a day (BID) | OPHTHALMIC | Status: DC

## 2012-03-09 MED ORDER — LORATADINE 10 MG PO TABS
10.0000 mg | ORAL_TABLET | Freq: Every day | ORAL | Status: DC
  Administered 2012-03-10 – 2012-03-14 (×5): 10 mg via ORAL
  Filled 2012-03-09 (×7): qty 1

## 2012-03-09 MED ORDER — ASPIRIN 81 MG PO CHEW
324.0000 mg | CHEWABLE_TABLET | ORAL | Status: AC
  Administered 2012-03-10: 324 mg via ORAL

## 2012-03-09 MED ORDER — DEXTROSE 50 % IV SOLN
25.0000 mL | INTRAVENOUS | Status: DC | PRN
  Filled 2012-03-09: qty 50

## 2012-03-09 MED ORDER — SODIUM CHLORIDE 0.9 % IV SOLN
INTRAVENOUS | Status: DC
  Administered 2012-03-10 – 2012-03-11 (×3): via INTRAVENOUS

## 2012-03-09 MED ORDER — LATANOPROST 0.005 % OP SOLN
1.0000 [drp] | Freq: Every day | OPHTHALMIC | Status: DC
  Administered 2012-03-10 – 2012-03-13 (×5): 1 [drp] via OPHTHALMIC
  Filled 2012-03-09: qty 2.5

## 2012-03-09 MED ORDER — DEXTROSE-NACL 5-0.45 % IV SOLN
INTRAVENOUS | Status: DC
  Administered 2012-03-10: 09:00:00 via INTRAVENOUS

## 2012-03-09 MED ORDER — SODIUM CHLORIDE 0.9 % IV SOLN
INTRAVENOUS | Status: AC
  Administered 2012-03-10: 999 mL/h via INTRAVENOUS

## 2012-03-09 MED ORDER — INSULIN ASPART 100 UNIT/ML ~~LOC~~ SOLN
5.0000 [IU] | Freq: Once | SUBCUTANEOUS | Status: AC
  Administered 2012-03-09: 100 [IU] via INTRAVENOUS
  Filled 2012-03-09: qty 5

## 2012-03-09 MED ORDER — SODIUM CHLORIDE 0.9 % IV SOLN
INTRAVENOUS | Status: DC
  Administered 2012-03-09: 5.4 [IU]/h via INTRAVENOUS
  Administered 2012-03-10: 26.6 [IU]/h via INTRAVENOUS
  Filled 2012-03-09 (×3): qty 1

## 2012-03-09 MED ORDER — ASPIRIN 300 MG RE SUPP: 300.0000 mg | RECTAL | Status: AC

## 2012-03-09 MED ORDER — INSULIN ASPART 100 UNIT/ML ~~LOC~~ SOLN
5.0000 [IU] | Freq: Once | SUBCUTANEOUS | Status: AC
  Administered 2012-03-09: 5 [IU] via INTRAVENOUS
  Filled 2012-03-09: qty 1

## 2012-03-09 MED ORDER — HEPARIN SODIUM (PORCINE) 5000 UNIT/ML IJ SOLN
5000.0000 [IU] | Freq: Three times a day (TID) | INTRAMUSCULAR | Status: DC
  Administered 2012-03-10 – 2012-03-12 (×8): 5000 [IU] via SUBCUTANEOUS
  Filled 2012-03-09 (×11): qty 1

## 2012-03-09 NOTE — ED Notes (Signed)
Pt sister  Jillene Bucks [phone] next of Kin

## 2012-03-09 NOTE — ED Provider Notes (Signed)
History     CSN: [id number]  Arrival date & time 03/09/12  1815   First MD Initiated Contact with Patient 03/09/12 1817      Chief Complaint  Patient presents with  . Hyperglycemia  . Irregular Heart Beat  . Fall    (Consider location/radiation/quality/duration/timing/severity/associated sxs/prior treatment) HPI.... level V caveat for urgent need for intervention. Patient found on the floor at home this afternoon approximately 1645. He had altered mental status. Glucose was greater than 600. He has a recent left clavicle fracture. He has also been treated for colon cancer. He arrival in emergency department, patient had diminished level of consciousness.  Past Medical History  Diagnosis Date  . Diabetes mellitus   . Glaucoma(365)   . Allergic rhinitis   . Prostate nodule   . Colonic mass   . Cancer   . Substance abuse   . Cancer of ascending colon, 7cm 05/25/2011  . Hypertension   . Pneumonia 1979 or 1980  . Anemia     Past Surgical History  Procedure Date  . Finger surgery 2009    right middle  . Nasal fracture surgery 1968  . Tonsillectomy 1957 - approximate  . 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 Portacath placement 07/01/2011    Procedure: INSERTION PORT-A-CATH;  Surgeon: Ardeth Sportsman, MD;  Location: [facility];  Service: General;  Laterality: Left;  Insertion of Port-A-Catheter Left Internal Jugular    Family History  Problem Relation Age of Onset  . Colon polyps Father   . Lung cancer Father   . Diabetes Father   . Breast cancer Mother   . Pancreatitis Mother     intestinal adhesions    History  Substance Use Topics  . Smoking status: Former Smoker -- 1.0 packs/day for 15 years    Types: Cigarettes    Quit date: 04/18/1993  . Smokeless tobacco: Never Used     Comment: marijuana every night   . Alcohol Use: Yes     Comment: 1 drink every 2 days      Review of Systems  Unable to perform ROS: Unstable vital signs     Allergies  Review of patient's allergies indicates no known allergies.  Home Medications   Current Outpatient Rx  Name  Route  Sig  Dispense  Refill  . ACCU-CHEK AVIVA PLUS VI STRP      Daily.         . ACETAMINOPHEN 500 MG PO TABS   Oral   Take 1,000 mg by mouth every 6 (six) hours as needed. Pain          . VITAMIN C PO   Oral   Take 1 tablet by mouth every morning.          Marland Kitchen ECOTRIN PO   Oral   Take 81 mg by mouth daily before breakfast. STOP ASA TODAY         . BRIMONIDINE TARTRATE-TIMOLOL 0.2-0.5 % OP SOLN   Both Eyes   Place 1 drop into both eyes every 12 (twelve) hours.          Di Kindle SULFATE 325 (65 FE) MG PO TABS   Oral   Take 1 tablet (325 mg total) by mouth 2 (two) times daily.   30 tablet   11   . HYDROCODONE-ACETAMINOPHEN 5-500 MG PO TABS   Oral   Take 1 tablet by mouth every 6 (six) hours as needed. For pain         .  INSULIN ASPART 100 UNIT/ML [place] SOLN   Subcutaneous   Inject 1-15 Units into the skin 3 (three) times daily before meals. Take 1 unit for ever 6 grams of carbs before each meal         . INSULIN GLARGINE 100 UNIT/ML [place] SOLN   Subcutaneous   Inject 22 Units into the skin at bedtime.         Marland Kitchen LATANOPROST 0.005 % OP SOLN   Both Eyes   Place 1 drop into both eyes at bedtime.         Marland Kitchen LEVOCETIRIZINE DIHYDROCHLORIDE 5 MG PO TABS   Oral   Take 5 mg by mouth daily before breakfast.          . LISINOPRIL PO   Oral   Take 5 mg by mouth daily before breakfast.          . ONE-DAILY MULTI VITAMINS PO TABS   Oral   Take 1 tablet by mouth every morning.          Marland Kitchen SIMVASTATIN PO   Oral   Take 10 mg by mouth daily before breakfast.          . TEMAZEPAM 15 MG PO CAPS   Oral   Take 15 mg by mouth at bedtime as needed. For sleep         . TRIAMCINOLONE ACETONIDE 55 MCG/ACT NA INHA   Nasal   Place 1 spray into the nose every morning. Allergies          . VITAMIN D (CHOLECALCIFEROL) PO   Oral    Take 2,000 Units by mouth every morning. Vitamin D 3         . TEMAZEPAM 15 MG PO CAPS   Oral   Take 1 capsule (15 mg total) by mouth at bedtime as needed for sleep.   30 capsule   0     BP 97/39  Pulse 86  Temp 96.4 F (35.8 C) (Axillary)  Resp 24  SpO2 88%  Physical Exam  Nursing note and vitals reviewed. Constitutional: He is oriented to person, place, and time.       Altered level of consciousness  HENT:  Head: Normocephalic and atraumatic.  Eyes: Conjunctivae normal and EOM are normal. Pupils are equal, round, and reactive to light.  Neck: Normal range of motion. Neck supple.  Cardiovascular: Normal rate, regular rhythm and normal heart sounds.   Pulmonary/Chest: Effort normal and breath sounds normal.  Abdominal: Soft. Bowel sounds are normal.  Musculoskeletal: Normal range of motion.  Neurological: He is alert and oriented to person, place, and time.  Skin:       Pale, dusky  Psychiatric: He has a normal mood and affect.    ED Course  Procedures (including critical care time)  Labs Reviewed  GLUCOSE, CAPILLARY - Abnormal; Notable for the following:    Glucose-Capillary >600 (*)     All other components within normal limits  CBC WITH DIFFERENTIAL - Abnormal; Notable for the following:    WBC 12.1 (*)     RBC 3.40 (*)     Hemoglobin 11.7 (*)     HCT 36.3 (*)     MCV 106.8 (*)     MCH 34.4 (*)     Platelets 119 (*)  PLATELET COUNT CONFIRMED BY SMEAR   Neutrophils Relative 82 (*)     Neutro Abs 9.9 (*)     All other components within normal limits  BASIC METABOLIC  PANEL - Abnormal; Notable for the following:    Sodium 112 (*)     Potassium 7.5 (*)     Chloride 69 (*)     CO2 <7 (*)     Glucose, Bld 1277 (*)     BUN 57 (*)     Creatinine, Ser 3.41 (*)     Calcium 7.2 (*)     GFR calc non Af Amer 18 (*)     GFR calc Af Amer 21 (*)     All other components within normal limits  URINALYSIS, ROUTINE W REFLEX MICROSCOPIC - Abnormal; Notable for the  following:    APPearance CLOUDY (*)     Glucose, UA >1000 (*)     Hgb urine dipstick LARGE (*)     Bilirubin Urine SMALL (*)     Ketones, ur 15 (*)     Protein, ur 30 (*)     All other components within normal limits  GLUCOSE, CAPILLARY - Abnormal; Notable for the following:    Glucose-Capillary >600 (*)     All other components within normal limits  POCT I-STAT 3, BLOOD GAS (G3+) - Abnormal; Notable for the following:    pH, Arterial 7.077 (*)     pCO2 arterial 14.2 (*)     pO2, Arterial 110.0 (*)     Bicarbonate 4.2 (*)     Acid-base deficit 24.0 (*)     All other components within normal limits  GLUCOSE, CAPILLARY - Abnormal; Notable for the following:    Glucose-Capillary >600 (*)     All other components within normal limits  URINE MICROSCOPIC-ADD ON  URINE CULTURE  CBC  BASIC METABOLIC PANEL  BASIC METABOLIC PANEL  BASIC METABOLIC PANEL  BASIC METABOLIC PANEL   No results found.   No diagnosis found.   Date: 03/09/2012  Rate: 92  Rhythm: atrial fibrillation  QRS Axis: normal  Intervals: normal  ST/T Wave abnormalities: ST depressions inferiorly  Conduction Disutrbances:none  Narrative Interpretation:   Old EKG Reviewed: changes noted CRITICAL CARE Performed by: Donnetta Hutching   Total critical care time: 45  Critical care time was exclusive of separately billable procedures and treating other patients.  Critical care was necessary to treat or prevent imminent or life-threatening deterioration.  Critical care was time spent personally by me on the following activities: development of treatment plan with patient and/or surrogate as well as nursing, discussions with consultants, evaluation of patient's response to treatment, examination of patient, obtaining history from patient or surrogate, ordering and performing treatments and interventions, ordering and review of laboratory studies, ordering and review of radiographic studies, pulse oximetry and re-evaluation  of patient's condition.  MDM  History and physical consistent with diabetic ketoacidosis. Rx regular his IV hydration, IV insulin, Glucomander protocol. Multiple rechecks in emergency department. Admit to critical care.        Donnetta Hutching, MD 03/09/12 [phone]

## 2012-03-09 NOTE — ED Notes (Signed)
Complains of extreme thirst

## 2012-03-09 NOTE — ED Notes (Signed)
Fell yesterday and injured left shoulder, fractured left shoulder and clavicle, syncopal episode today, blood sugar for EMS over 600, low blood pressure, pt in afib, with runs of VT.  Received 1 liter of IVF.

## 2012-03-10 ENCOUNTER — Other Ambulatory Visit: Payer: Self-pay

## 2012-03-10 DIAGNOSIS — E1110 Type 2 diabetes mellitus with ketoacidosis without coma: Secondary | ICD-10-CM | POA: Diagnosis present

## 2012-03-10 DIAGNOSIS — E860 Dehydration: Secondary | ICD-10-CM | POA: Diagnosis present

## 2012-03-10 DIAGNOSIS — I4891 Unspecified atrial fibrillation: Secondary | ICD-10-CM | POA: Insufficient documentation

## 2012-03-10 DIAGNOSIS — E1010 Type 1 diabetes mellitus with ketoacidosis without coma: Secondary | ICD-10-CM

## 2012-03-10 DIAGNOSIS — I4510 Unspecified right bundle-branch block: Secondary | ICD-10-CM | POA: Diagnosis present

## 2012-03-10 LAB — BASIC METABOLIC PANEL
BUN: 52 mg/dL — ABNORMAL HIGH (ref 6–23)
BUN: 48 mg/dL — ABNORMAL HIGH (ref 6–23)
BUN: 45 mg/dL — ABNORMAL HIGH (ref 6–23)
CO2: 7 mEq/L — CL (ref 19–32)
Calcium: 7 mg/dL — ABNORMAL LOW (ref 8.4–10.5)
Calcium: 6.9 mg/dL — ABNORMAL LOW (ref 8.4–10.5)
Calcium: 7.4 mg/dL — ABNORMAL LOW (ref 8.4–10.5)
Calcium: 7.3 mg/dL — ABNORMAL LOW (ref 8.4–10.5)
Calcium: 7.8 mg/dL — ABNORMAL LOW (ref 8.4–10.5)
Chloride: 77 mEq/L — ABNORMAL LOW (ref 96–112)
Chloride: 91 mEq/L — ABNORMAL LOW (ref 96–112)
Chloride: 95 mEq/L — ABNORMAL LOW (ref 96–112)
Creatinine, Ser: 3.37 mg/dL — ABNORMAL HIGH (ref 0.50–1.35)
Creatinine, Ser: 2.54 mg/dL — ABNORMAL HIGH (ref 0.50–1.35)
Creatinine, Ser: 2.24 mg/dL — ABNORMAL HIGH (ref 0.50–1.35)
Creatinine, Ser: 1.68 mg/dL — ABNORMAL HIGH (ref 0.50–1.35)
GFR calc Af Amer: 21 mL/min — ABNORMAL LOW (ref >90)
GFR calc Af Amer: 23 mL/min — ABNORMAL LOW (ref >90)
GFR calc Af Amer: 25 mL/min — ABNORMAL LOW (ref >90)
GFR calc Af Amer: 30 mL/min — ABNORMAL LOW (ref >90)
GFR calc Af Amer: 35 mL/min — ABNORMAL LOW (ref >90)
GFR calc Af Amer: 38 mL/min — ABNORMAL LOW (ref >90)
GFR calc Af Amer: 39 mL/min — ABNORMAL LOW (ref >90)
GFR calc Af Amer: 49 mL/min — ABNORMAL LOW (ref >90)
GFR calc non Af Amer: 18 mL/min — ABNORMAL LOW (ref >90)
GFR calc non Af Amer: 20 mL/min — ABNORMAL LOW (ref >90)
GFR calc non Af Amer: 26 mL/min — ABNORMAL LOW (ref >90)
GFR calc non Af Amer: 30 mL/min — ABNORMAL LOW (ref >90)
GFR calc non Af Amer: 33 mL/min — ABNORMAL LOW (ref >90)
GFR calc non Af Amer: 33 mL/min — ABNORMAL LOW (ref >90)
GFR calc non Af Amer: 39 mL/min — ABNORMAL LOW (ref >90)
Glucose, Bld: 191 mg/dL — ABNORMAL HIGH (ref 70–99)
Potassium: 5.6 mEq/L — ABNORMAL HIGH (ref 3.5–5.1)
Potassium: 5 mEq/L (ref 3.5–5.1)
Potassium: 4.4 mEq/L (ref 3.5–5.1)
Potassium: 4.5 mEq/L (ref 3.5–5.1)
Potassium: 4.1 mEq/L (ref 3.5–5.1)
Potassium: 3.1 mEq/L — ABNORMAL LOW (ref 3.5–5.1)
Sodium: 115 mEq/L — CL (ref 135–145)
Sodium: 117 mEq/L — CL (ref 135–145)
Sodium: 121 mEq/L — ABNORMAL LOW (ref 135–145)
Sodium: 134 mEq/L — ABNORMAL LOW (ref 135–145)
Sodium: 130 mEq/L — ABNORMAL LOW (ref 135–145)
Sodium: 131 mEq/L — ABNORMAL LOW (ref 135–145)
Sodium: 125 mEq/L — ABNORMAL LOW (ref 135–145)
Sodium: 130 mEq/L — ABNORMAL LOW (ref 135–145)

## 2012-03-10 LAB — PHOSPHORUS: Phosphorus: 3.8 mg/dL (ref 2.3–4.6)

## 2012-03-10 LAB — URINALYSIS, ROUTINE W REFLEX MICROSCOPIC
Leukocytes, UA: NEGATIVE
Nitrite: NEGATIVE
Specific Gravity, Urine: 1.021 (ref 1.005–1.030)
Urobilinogen, UA: 0.2 mg/dL (ref 0.0–1.0)
pH: 5 (ref 5.0–8.0)

## 2012-03-10 LAB — GLUCOSE, CAPILLARY
Glucose-Capillary: >600 mg/dL (ref 70–99)
Glucose-Capillary: >600 mg/dL (ref 70–99)
Glucose-Capillary: 591 mg/dL (ref 70–99)
Glucose-Capillary: 473 mg/dL — ABNORMAL HIGH (ref 70–99)
Glucose-Capillary: 109 mg/dL — ABNORMAL HIGH (ref 70–99)
Glucose-Capillary: 103 mg/dL — ABNORMAL HIGH (ref 70–99)
Glucose-Capillary: 132 mg/dL — ABNORMAL HIGH (ref 70–99)
Glucose-Capillary: 245 mg/dL — ABNORMAL HIGH (ref 70–99)

## 2012-03-10 LAB — CBC
Hemoglobin: 12 g/dL — ABNORMAL LOW (ref 13.0–17.0)
MCH: 33.4 pg (ref 26.0–34.0)
MCV: 90 fL (ref 78.0–100.0)
Platelets: 71 10*3/uL — ABNORMAL LOW (ref 150–400)
RBC: 3.63 MIL/uL — ABNORMAL LOW (ref 4.22–5.81)
RBC: 3.29 MIL/uL — ABNORMAL LOW (ref 4.22–5.81)
RDW: 11.3 % — ABNORMAL LOW (ref 11.5–15.5)
WBC: 6.4 10*3/uL (ref 4.0–10.5)
WBC: 4.4 10*3/uL (ref 4.0–10.5)

## 2012-03-10 LAB — URINE MICROSCOPIC-ADD ON

## 2012-03-10 LAB — MRSA PCR SCREENING: MRSA by PCR: NEGATIVE

## 2012-03-10 LAB — TROPONIN I: Troponin I: 1 ng/mL (ref <0.30)

## 2012-03-10 LAB — LACTIC ACID, PLASMA: Lactic Acid, Venous: 1.8 mmol/L (ref 0.5–2.2)

## 2012-03-10 LAB — LIPASE, BLOOD: Lipase: 156 U/L — ABNORMAL HIGH (ref 11–59)

## 2012-03-10 MED ORDER — PHENOL 1.4 % MT LIQD
1.0000 | OROMUCOSAL | Status: DC | PRN
  Administered 2012-03-10: 1 via OROMUCOSAL
  Filled 2012-03-10: qty 177

## 2012-03-10 MED ORDER — ACETAMINOPHEN 325 MG PO TABS
650.0000 mg | ORAL_TABLET | Freq: Four times a day (QID) | ORAL | Status: DC | PRN
  Administered 2012-03-10 – 2012-03-13 (×6): 650 mg via ORAL
  Filled 2012-03-10 (×6): qty 2

## 2012-03-10 MED ORDER — INSULIN ASPART 100 UNIT/ML ~~LOC~~ SOLN
0.0000 [IU] | Freq: Three times a day (TID) | SUBCUTANEOUS | Status: DC
  Administered 2012-03-11: 8 [IU] via SUBCUTANEOUS
  Administered 2012-03-11: 3 [IU] via SUBCUTANEOUS
  Administered 2012-03-12: 15 [IU] via SUBCUTANEOUS

## 2012-03-10 MED ORDER — ASPIRIN 81 MG PO CHEW
CHEWABLE_TABLET | ORAL | Status: AC
  Filled 2012-03-10: qty 4

## 2012-03-10 MED ORDER — INSULIN GLARGINE 100 UNIT/ML ~~LOC~~ SOLN: 15.0000 [IU] | Freq: Every day | SUBCUTANEOUS | Status: DC

## 2012-03-10 MED ORDER — BRIMONIDINE TARTRATE 0.2 % OP SOLN
1.0000 [drp] | Freq: Two times a day (BID) | OPHTHALMIC | Status: DC
  Administered 2012-03-10 – 2012-03-14 (×10): 1 [drp] via OPHTHALMIC
  Filled 2012-03-10 (×2): qty 5

## 2012-03-10 MED ORDER — DEXTROSE 10 % IV SOLN
INTRAVENOUS | Status: DC
  Administered 2012-03-10: 20:00:00 via INTRAVENOUS

## 2012-03-10 MED ORDER — ASPIRIN 325 MG PO TABS
ORAL_TABLET | ORAL | Status: AC
  Filled 2012-03-10: qty 1

## 2012-03-10 MED ORDER — TIMOLOL MALEATE 0.5 % OP SOLN
1.0000 [drp] | Freq: Two times a day (BID) | OPHTHALMIC | Status: DC
  Administered 2012-03-10 – 2012-03-14 (×10): 1 [drp] via OPHTHALMIC
  Filled 2012-03-10: qty 5

## 2012-03-10 NOTE — Progress Notes (Signed)
CRITICAL VALUE ALERT  Critical value received:  CO2 9, Glucose 770  Date of notification:  03-10-12  Time of notification:  0420  Critical value read back:yes  Nurse who received alert:  Corliss Skains RN  MD notified (1st page):  [phone]  Time of first page:  0423  MD notified (2nd page):  Time of second page:  Responding MD:  Dr. Roslynn Amble  Time MD responded: [phone]

## 2012-03-10 NOTE — Progress Notes (Signed)
According to 2145 labs Anion gap = 16, will continue to monitor.  Corliss Skains RN

## 2012-03-10 NOTE — Progress Notes (Signed)
Orthopedic Tech Progress Note Patient Details:  Greg Robertson 1951-01-27 [id number] Pending order for sling immobilizer recognized. Patient's arm currently being supported by sheet. Sling immobilizer applied to Left UE; tolerated well. Patient able to follow verbal cues but is confused otherwise. Nurse notified that sling was applied. Ortho Devices Type of Ortho Device: Sling immobilizer Ortho Device/Splint Location: Left UE Ortho Device/Splint Interventions: Application   Asia R Thompson 03/10/2012, 3:36 PM

## 2012-03-10 NOTE — Progress Notes (Signed)
CRITICAL VALUE ALERT  Critical value received: Elevated Blood sugar 660  Date of notification:  03-10-12  Time of notification: 2015  Critical value read back:yes  Nurse who received alert:  Deforest Hoyles RN   MD notified (1st page): Dr. Tula Nakayama  Time of first page:  2015  MD notified (2nd page):  Time of second page:  Responding MD:  Dr. Tula Nakayama  Time MD responded:  2030

## 2012-03-10 NOTE — H&P (Signed)
PULMONARY  / CRITICAL CARE MEDICINE  Name: Greg Robertson MRN: [id number] DOB: November 13, 1950    LOS: 1  REFERRING MD :  none  CHIEF COMPLAINT:  DKA  BRIEF PATIENT DESCRIPTION: 61 year old M with colon cancer and recent clavicle fracture who presented to the ED in DKA (Glu 1277, pH 7.007, HCO3 4.2) and altered mental status who was admitted to the ICU for further care.   INTERVAL HISTORY: patient sleeping comfortably, sugar remains high, elevated troponin   LINES / TUBES: PIV  CULTURES: Urine Culture 11/22>>>  ANTIBIOTICS: none  SIGNIFICANT EVENTS:  11/21- Clavicle fracture in outpatient setting 11/22 - admitted with severe DKA 11/23 - positive troponin  LEVEL OF CARE:  ICU PRIMARY SERVICE:  PCCM CONSULTANTS:  None CODE STATUS: Full DIET:  NPO DVT Px:  SCD GI Px:  none   VITAL SIGNS: Temp:  [96.4 F (35.8 C)-97.7 F (36.5 C)] 97.7 F (36.5 C) (11/23 0344) Pulse Rate:  [40-113] 97  (11/23 0600) Resp:  [15-32] 17  (11/23 0600) BP: (80-131)/(39-69) 121/47 mmHg (11/23 0600) SpO2:  [88 %-100 %] 100 % (11/23 0600) Weight:  [177 lb 4 oz (80.4 kg)-180 lb (81.647 kg)] 177 lb 4 oz (80.4 kg) (11/23 0438) HEMODYNAMICS:   VENTILATOR SETTINGS:   INTAKE / OUTPUT: Intake/Output      11/22 0701 - 11/23 0700   I.V. (mL/kg) 841.9 (10.5)   Total Intake(mL/kg) 841.9 (10.5)   Urine (mL/kg/hr) 1215 (0.6)   Total Output 1215   Net -373.1         PHYSICAL EXAMINATION: General:  Middle age WM, acutely ill appearing. Mild distress Neuro:  Awake, alert and oriented x 4, no focal deficits HEENT: NCAT, PERRA, MM dry, no JVD, no lymphadenopathy Cardiovascular: sinus tachycardia, no murmurs, palpable port-a-cath left anterior chest  Lungs: mildly increased WOB, lungs CTA-B Abdomen:  Soft, NDNT Musculoskeletal: diffuse ecchymosis and swelling of left shoulder, no step off deformity of left clavicle; non pitting edema of LUE; no edema of LE Skin:  No rashes or  lesions Genitourinary: Foley catheter in place   LABS: cbc  Lab 03/10/12 0050 03/09/12 2000  WBC 6.4 12.1*  HGB 12.0* 11.7*  HCT 35.4* 36.3*   chemistry  Lab 03/10/12 0533 03/10/12 0313 03/10/12 0048  NA 127* 121* 117*  K 4.4 5.0 5.6*  CO2 13* 9* 7*  GLUCOSE 600* 770* 953*  BUN 55* 57* 59*  CREATININE 2.90* 3.17* 3.34*  CALCIUM 7.2* 6.9* 6.9*  MG 2.1 -- --  PHOS 3.8 -- --   Liver fxn No results found for this basename: AST:3,ALT:3,ALKPHOS:3,BILITOT:3,PROT:3,ALBUMIN:3 in the last 168 hours coags No results found for this basename: APTT:3,INR:3 in the last 168 hours Sepsis markers  Lab 03/10/12 0533  LATICACIDVEN 1.8  PROCALCITON --   Cardiac markers  Lab 03/10/12 0533  CKTOTAL --  CKMB --  TROPONINI 1.57*   BNP No results found for this basename: PROBNP:3 in the last 168 hours ABG  Lab 03/09/12 2242  PHART 7.077*  PCO2ART 14.2*  PO2ART 110.0*  HCO3 4.2*  TCO2 <5    CBG trend  Lab 03/10/12 0535 03/10/12 0432 03/10/12 0327 03/10/12 0219 03/10/12 0111  GLUCAP 591* >600* >600* >600* >600*    IMAGING:  ECG:  Date: 03/09/2012  Rate: 92   Rhythm: atrial fibrillation  QRS Axis: normal  Intervals: PR no calculated, prolonged QRS  ST/T Wave abnormalities: nonspecific ST changes  Conduction Disutrbances:right bundle branch block  Narrative Interpretation: new onset a  fib w/ conduction delay, setting of K+ 7.5  Old EKG Reviewed: previous ECG shows NSR   Date: 03/10/2012  Rate: 98  Rhythm: normal sinus rhythm  QRS Axis: normal  Intervals: normal  ST/T Wave abnormalities: normal  Conduction Disutrbances:none  Narrative Interpretation: resolved a fib with RBBB from previous ECG  Old EKG Reviewed: changes noted   DIAGNOSES: Principal Problem:  *Diabetic ketoacidosis Active Problems:  Dehydration, severe  Right bundle branch block  Atrial fibrillation by electrocardiogram   ASSESSMENT / PLAN:  PULMONARY  ASSESSMENT: No acute respiratory  compromise PLAN:   Continue to monitor, low threshold for repeat CXR  Keep O2 sat > 92%  CARDIOVASCULAR  ASSESSMENT:  -A fib w/ RBBB, resolved - Elevated troponin w/out EKG changes, likely demand ischemia   PLAN:  - Obtain Trop, repeat ECG @ 11:00AM  - Consider anticoagulation and cards consult if continued increasing Trop and EKG  RENAL  ASSESSMENT:   - AKI, likely secondary to hypovolemia - Electrolyte abnormalities - Na+, K+ PLAN:   - Creat improving, cont to trend - Monitor lytes, replete K+ per protocol  - Pure AG acidosis, so no role of HCO3 drip  GASTROINTESTINAL  ASSESSMENT:   - Colon Cancer - Nausea and vomiting - Mildly elevated Lipase, likely not true pancreatitis PLAN:   - Alert oncology, Dr. Myrle Sheng, of patient's admission - Zofran PRN  HEMATOLOGIC  ASSESSMENT:  No acute issues PLAN:  Monitor CBC  INFECTIOUS  ASSESSMENT:  No clear infectious etiology PLAN:   - F/u urine cx, low threshold for wide spectrum antibiotics if develops fever  ENDOCRINE  ASSESSMENT: severe DKA PLAN:   Continue fluids, insulin per DKA protocol  NEUROLOGIC  ASSESSMENT:  Improved mentation PLAN:   Continue to monitor   Global: 61 year old M w/ severe DKA after persistent vomiting who is improving on DKA protocol; f/u elevated trop and EKF at 11:00 AM; improving    Si Raider. Clinton Sawyer, MD, MBA 03/10/2012, 7:00 AM Family Medicine Resident, PGY-2 [phone] pager  Attending:  I have seen and examined the patient with nurse practitioner/resident and agree with the note above.   Improving, no chest pain. No EKG changes. Cont insulin gtt, hopefully advance diet later today.  Yolonda Kida PCCM Pager: [phone] Cell: [phone] If no response, call [phone]

## 2012-03-10 NOTE — Progress Notes (Signed)
eLink Physician-Brief Progress Note Patient Name: Greg Robertson DOB: October 19, 1950 MRN: [id number]  Date of Service  03/10/2012   HPI/Events of Note   Widening AG while on Insulin gtt (6.2 U / hour)  eICU Interventions   D5@125  d/c'd.  D10@100  started to allow for increase in Insulin rate.   Intervention Category Major Interventions: Electrolyte abnormality - evaluation and management  Darlisa Spruiell 03/10/2012, 7:49 PM

## 2012-03-10 NOTE — Progress Notes (Signed)
Dr. Roslynn Amble aware of patient blood gas, no bicarbonate ordered at this time. Will continue to monitor patient.  Corliss Skains RN 03-10-12  Time: [phone]

## 2012-03-10 NOTE — Progress Notes (Signed)
CRITICAL VALUE ALERT  Critical value received:  Sodium 117, C02 7, Glucose 963  Date of notification: 03-10-12  Time of notification: 0130  Critical value read back:yes  Nurse who received alert:  Corliss Skains RN  MD notified (1st page):  Cathlean Cower RN to notify Dr. Darrick Penna   Time of first page:  0130  MD notified (2nd page):  Time of second page:  Responding MD: Cathlean Cower RN to notify Dr. Darrick Penna  Time MD responded:  [phone]

## 2012-03-10 NOTE — Progress Notes (Signed)
When Pt. Was admitted to 2[facility] Gluco[facility]abilizer patient information did not transfer over, pt. blood sugar >600 per glucostabilizer 5.4 units insulin drip, rechecked per glucostabilizer and cbg > 600 increased insulin to 10.8 units per glucostabilizer.  Corliss Skains RN  03-10-12 [phone]

## 2012-03-10 NOTE — Progress Notes (Signed)
CRITICAL VALUE ALERT  Critical value received: Elevated troponin 1.57  Date of notification:  03-10-12  Time of notification:  0535  Critical value read back:yes  Nurse who received alert:  Corliss Skains RN  MD notified (1st page):  Dr. Roslynn Amble  Time of first page:  0535   MD notified (2nd page):  Time of second page:  Responding MD: Dr. Roslynn Amble  Time MD responded:  [phone]

## 2012-03-10 NOTE — H&P (Signed)
PULMONARY  / CRITICAL CARE MEDICINE  Name: Greg Robertson MRN: [id number] DOB: 14-Jun-1950    LOS: 1  REFERRING MD :  none  CHIEF COMPLAINT:  DKA  BRIEF PATIENT DESCRIPTION: 61 year old M with colon cancer and recent clavicle fracture who presented to the ED in DKA (Glu 1277, pH 7.007, HCO3 4.2) and altered mental status who was admitted to the ICU for further care.   LINES / TUBES: PIV  CULTURES: Urine Culture 11/22>>>  ANTIBIOTICS: none  SIGNIFICANT EVENTS:  11/21- Clavicle fracture in outpatient setting 11/22 - admitted with severe DKA  LEVEL OF CARE:  ICU PRIMARY SERVICE:  PCCM CONSULTANTS:  None CODE STATUS: Full DIET:  NPO DVT Px:  SCD GI Px:  none  HISTORY OF PRESENT ILLNESS:  61 year old M with colon cancer and DM who suffered a left clavicular fracture after a mechanical fall in his home on the morning of 03/08/12. He was evaluated by his PCP's office in [facility] and prescribed pain medication. He does not recall the name of the pain medication, but he believes it caused him to have profuse vomiting. He estimates that he vomited 15-20 times in a 24 hour period. This was not accompanied by diarrhea or fever. He noted that he started to become very weak in his home and was not able to get off the floor. He was found by his father's home health aid and the transported to [facility] for further management. Upon arrival to the ED, the patient had a glucose > 1200, AG > 50, and pH 7.007. He was given 4L of normal saline and started on an insulin drip.   PAST MEDICAL HISTORY :  Past Medical History  Diagnosis Date  . Diabetes mellitus   . Glaucoma(365)   . Allergic rhinitis   . Prostate nodule   . Colonic mass   . Cancer   . Substance abuse   . Cancer of ascending colon, 7cm 05/25/2011  . Hypertension   . Pneumonia 1979 or 1980  . Anemia    Past Surgical History  Procedure Date  . Finger surgery 2009    right middle  . Nasal fracture surgery 1968  .  Tonsillectomy 1957 - approximate  . 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 Portacath placement 07/01/2011    Procedure: INSERTION PORT-A-CATH;  Surgeon: Ardeth Sportsman, MD;  Location: [facility];  Service: General;  Laterality: Left;  Insertion of Port-A-Catheter Left Internal Jugular   Prior to Admission medications   Medication Sig Start Date End Date Taking? Authorizing Provider  ACCU-CHEK AVIVA PLUS test strip Daily. 05/17/11  Yes Historical Provider, MD  acetaminophen (TYLENOL) 500 MG tablet Take 1,000 mg by mouth every 6 (six) hours as needed. Pain    Yes Historical Provider, MD  Ascorbic Acid (VITAMIN C PO) Take 1 tablet by mouth every morning.    Yes Historical Provider, MD  Aspirin (ECOTRIN PO) Take 81 mg by mouth daily before breakfast. STOP ASA TODAY   Yes Historical Provider, MD  brimonidine-timolol (COMBIGAN) 0.2-0.5 % ophthalmic solution Place 1 drop into both eyes every 12 (twelve) hours.    Yes Historical Provider, MD  ferrous sulfate 325 (65 FE) MG tablet Take 1 tablet (325 mg total) by mouth 2 (two) times daily. 06/29/11 06/28/12 Yes Ladene Artist, MD  HYDROcodone-acetaminophen (VICODIN) 5-500 MG per tablet Take 1 tablet by mouth every 6 (six) hours as needed. For pain   Yes Historical  Provider, MD  insulin aspart (NOVOLOG) 100 UNIT/ML injection Inject 1-15 Units into the skin 3 (three) times daily before meals. Take 1 unit for ever 6 grams of carbs before each meal   Yes Historical Provider, MD  insulin glargine (LANTUS) 100 UNIT/ML injection Inject 22 Units into the skin at bedtime.   Yes Historical Provider, MD  latanoprost (XALATAN) 0.005 % ophthalmic solution Place 1 drop into both eyes at bedtime.   Yes Historical Provider, MD  levocetirizine (XYZAL) 5 MG tablet Take 5 mg by mouth daily before breakfast.    Yes Historical Provider, MD  LISINOPRIL PO Take 5 mg by mouth daily before breakfast.    Yes Historical Provider, MD  Multiple Vitamin  (MULTIVITAMIN) tablet Take 1 tablet by mouth every morning.    Yes Historical Provider, MD  SIMVASTATIN PO Take 10 mg by mouth daily before breakfast.    Yes Historical Provider, MD  temazepam (RESTORIL) 15 MG capsule Take 15 mg by mouth at bedtime as needed. For sleep   Yes Historical Provider, MD  triamcinolone (NASACORT) 55 MCG/ACT nasal inhaler Place 1 spray into the nose every morning. Allergies    Yes Historical Provider, MD  VITAMIN D, CHOLECALCIFEROL, PO Take 2,000 Units by mouth every morning. Vitamin D 3   Yes Historical Provider, MD  temazepam (RESTORIL) 15 MG capsule Take 1 capsule (15 mg total) by mouth at bedtime as needed for sleep. 07/08/11 08/07/11  Ladene Artist, MD   No Known Allergies  FAMILY HISTORY:  Family History  Problem Relation Age of Onset  . Colon polyps Father   . Lung cancer Father   . Diabetes Father   . Breast cancer Mother   . Pancreatitis Mother     intestinal adhesions   SOCIAL HISTORY:  reports that he quit smoking about 18 years ago. His smoking use included Cigarettes. He has a 15 pack-year smoking history. He has never used smokeless tobacco. He reports that he drinks alcohol. He reports that he uses illicit drugs (Marijuana).  REVIEW OF SYSTEMS:   Positive of left shoulder pain, sore throat, generalized weakness Negative for fever, diarrhea, skin lesions or sores, GI bleeding, chest pain, shortness of breath   INTERVAL HISTORY: none  VITAL SIGNS: Temp:  [96.4 F (35.8 C)-97.7 F (36.5 C)] 97.7 F (36.5 C) (11/23 0344) Pulse Rate:  [40-113] 94  (11/23 0330) Resp:  [15-32] 20  (11/23 0330) BP: (80-123)/(39-69) 115/52 mmHg (11/23 0330) SpO2:  [88 %-100 %] 100 % (11/23 0330) Weight:  [177 lb 4 oz (80.4 kg)-180 lb (81.647 kg)] 177 lb 4 oz (80.4 kg) (11/23 0130) HEMODYNAMICS:   VENTILATOR SETTINGS:   INTAKE / OUTPUT: Intake/Output      11/22 0701 - 11/23 0700   I.V. (mL/kg) 341.4 (4.2)   Total Intake(mL/kg) 341.4 (4.2)   Urine  (mL/kg/hr) 635 (0.3)   Total Output 635   Net -293.6         PHYSICAL EXAMINATION: General:  Middle age WM, acutely ill appearing. Mild distress Neuro:  Awake, alert and oriented x 4, no focal deficits HEENT: NCAT, PERRA, MM dry, no JVD, no lymphadenopathy Cardiovascular: sinus tachycardia, no murmurs, palpable port-a-cath left anterior chest  Lungs: mildly increased WOB, lungs CTA-B Abdomen:  Soft, NDNT Musculoskeletal: diffuse ecchymosis and swelling of left shoulder, no step off deformity of left clavicle; non pitting edema of LUE; no edema of LE Skin:  No rashes or lesions Genitourinary: Foley catheter in place   LABS: cbc  Lab 03/10/12 0050 03/09/12 2000  WBC 6.4 12.1*  HGB 12.0* 11.7*  HCT 35.4* 36.3*   chemistry  Lab 03/10/12 0313 03/10/12 0048 03/09/12 2356  NA 121* 117* 115*  K 5.0 5.6* 5.9*  CO2 PENDING 7* <7*  GLUCOSE PENDING 953* 972*  BUN 57* 59* 59*  CREATININE 3.17* 3.34* 3.37*  CALCIUM 6.9* 6.9* 7.0*  MG -- -- --  PHOS -- -- --   Liver fxn No results found for this basename: AST:3,ALT:3,ALKPHOS:3,BILITOT:3,PROT:3,ALBUMIN:3 in the last 168 hours coags No results found for this basename: APTT:3,INR:3 in the last 168 hours Sepsis markers No results found for this basename: LATICACIDVEN:3,PROCALCITON:3 in the last 168 hours Cardiac markers No results found for this basename: CKTOTAL:3,CKMB:3,TROPONINI:3 in the last 168 hours BNP No results found for this basename: PROBNP:3 in the last 168 hours ABG  Lab 03/09/12 2242  PHART 7.077*  PCO2ART 14.2*  PO2ART 110.0*  HCO3 4.2*  TCO2 <5    CBG trend  Lab 03/10/12 0049 03/09/12 2340 03/09/12 2229 03/09/12 1818  GLUCAP >600* >600* >600* >600*    IMAGING:  ECG:  Date: 03/09/2012  Rate: 92   Rhythm: atrial fibrillation  QRS Axis: normal  Intervals: PR no calculated, prolonged QRS  ST/T Wave abnormalities: nonspecific ST changes  Conduction Disutrbances:right bundle branch block  Narrative  Interpretation: new onset a fib w/ conduction delay, setting of K+ 7.5  Old EKG Reviewed: previous ECG shows NSR   DIAGNOSES: Principal Problem:  *Diabetic ketoacidosis Active Problems:  Dehydration, severe  Right bundle branch block  Atrial fibrillation by electrocardiogram   ASSESSMENT / PLAN:  PULMONARY  ASSESSMENT: No acute respiratory compromise PLAN:   Continue to monitor, low threshold for repeat CXR  Keep O2 sat > 92%  CARDIOVASCULAR  ASSESSMENT:  - Abnormal ECG New atrial fib and RBBB, non specific ST changes in setting PLAN:  - Obtain Trop, repeat ECG  RENAL  ASSESSMENT:   - AKI, likely secondary to hypovolemia - Electrolyte abnormalities - Na+, K+ PLAN:   - trend Cr and UOP, consider sending FeNa - Monitor lytes, replete K+ per protocol  - Pure AG acidosis, so no role of HCO3 drip  GASTROINTESTINAL  ASSESSMENT:   - Colon Cancer - Nausea and vomiting PLAN:   - Alert oncology, Dr. Myrle Sheng, of patient's admission - Zofran PRN  HEMATOLOGIC  ASSESSMENT:  No acute issues PLAN:  Monitor CBC  INFECTIOUS  ASSESSMENT:  No clear infectious etiology PLAN:   - F/u urine cx, low threshold for wide spectrum antibiotics if develops fever  ENDOCRINE  ASSESSMENT: severe DKA PLAN:   Continue fluids, insulin per DKA protocol  NEUROLOGIC  ASSESSMENT:  Improved mentation PLAN:   Continue to monitor   Global: 61 year old M w/ severe DKA after persistent vomiting who is improving on DKA protocol   Si Raider. Clinton Sawyer, MD, MBA 03/10/2012, 4:20 AM Family Medicine Resident, PGY-2 [phone] pager   I provided 35 minutes of critical care time in the care of this patient.

## 2012-03-11 DIAGNOSIS — E1110 Type 2 diabetes mellitus with ketoacidosis without coma: Secondary | ICD-10-CM

## 2012-03-11 DIAGNOSIS — I4891 Unspecified atrial fibrillation: Secondary | ICD-10-CM

## 2012-03-11 DIAGNOSIS — R7989 Other specified abnormal findings of blood chemistry: Secondary | ICD-10-CM

## 2012-03-11 DIAGNOSIS — I214 Non-ST elevation (NSTEMI) myocardial infarction: Secondary | ICD-10-CM | POA: Diagnosis present

## 2012-03-11 LAB — GLUCOSE, CAPILLARY
Glucose-Capillary: 164 mg/dL — ABNORMAL HIGH (ref 70–99)
Glucose-Capillary: 172 mg/dL — ABNORMAL HIGH (ref 70–99)
Glucose-Capillary: 175 mg/dL — ABNORMAL HIGH (ref 70–99)
Glucose-Capillary: 221 mg/dL — ABNORMAL HIGH (ref 70–99)
Glucose-Capillary: 256 mg/dL — ABNORMAL HIGH (ref 70–99)
Glucose-Capillary: 158 mg/dL — ABNORMAL HIGH (ref 70–99)
Glucose-Capillary: 165 mg/dL — ABNORMAL HIGH (ref 70–99)
Glucose-Capillary: 206 mg/dL — ABNORMAL HIGH (ref 70–99)

## 2012-03-11 LAB — BASIC METABOLIC PANEL
BUN: 25 mg/dL — ABNORMAL HIGH (ref 6–23)
BUN: 32 mg/dL — ABNORMAL HIGH (ref 6–23)
BUN: 37 mg/dL — ABNORMAL HIGH (ref 6–23)
BUN: 39 mg/dL — ABNORMAL HIGH (ref 6–23)
BUN: 44 mg/dL — ABNORMAL HIGH (ref 6–23)
CO2: 24 mEq/L (ref 19–32)
Calcium: 7.7 mg/dL — ABNORMAL LOW (ref 8.4–10.5)
Calcium: 8 mg/dL — ABNORMAL LOW (ref 8.4–10.5)
Calcium: 8 mg/dL — ABNORMAL LOW (ref 8.4–10.5)
Calcium: 8 mg/dL — ABNORMAL LOW (ref 8.4–10.5)
Chloride: 98 mEq/L (ref 96–112)
Creatinine, Ser: 1.14 mg/dL (ref 0.50–1.35)
Creatinine, Ser: 1.58 mg/dL — ABNORMAL HIGH (ref 0.50–1.35)
Creatinine, Ser: 1.67 mg/dL — ABNORMAL HIGH (ref 0.50–1.35)
Creatinine, Ser: 1.79 mg/dL — ABNORMAL HIGH (ref 0.50–1.35)
GFR calc Af Amer: 45 mL/min — ABNORMAL LOW (ref >90)
GFR calc Af Amer: 49 mL/min — ABNORMAL LOW (ref >90)
GFR calc Af Amer: 53 mL/min — ABNORMAL LOW (ref >90)
GFR calc Af Amer: 58 mL/min — ABNORMAL LOW (ref >90)
GFR calc Af Amer: 78 mL/min — ABNORMAL LOW (ref >90)
GFR calc non Af Amer: 39 mL/min — ABNORMAL LOW (ref >90)
GFR calc non Af Amer: 43 mL/min — ABNORMAL LOW (ref >90)
GFR calc non Af Amer: 50 mL/min — ABNORMAL LOW (ref >90)
GFR calc non Af Amer: 68 mL/min — ABNORMAL LOW (ref >90)
Potassium: 2.8 mEq/L — ABNORMAL LOW (ref 3.5–5.1)
Potassium: 3.7 mEq/L (ref 3.5–5.1)
Potassium: 6.9 mEq/L (ref 3.5–5.1)

## 2012-03-11 LAB — URINE CULTURE: Special Requests: NORMAL

## 2012-03-11 LAB — CK TOTAL AND CKMB (NOT AT ARMC): Relative Index: 0.6 (ref 0.0–2.5)

## 2012-03-11 LAB — CBC
MCH: 32.5 pg (ref 26.0–34.0)
MCV: 87.8 fL (ref 78.0–100.0)
Platelets: 73 10*3/uL — ABNORMAL LOW (ref 150–400)
RDW: 11.4 % — ABNORMAL LOW (ref 11.5–15.5)

## 2012-03-11 LAB — TROPONIN I: Troponin I: 0.41 ng/mL (ref <0.30)

## 2012-03-11 MED ORDER — SODIUM BICARBONATE 8.4 % IV SOLN
50.0000 meq | Freq: Once | INTRAVENOUS | Status: AC
  Administered 2012-03-11: 50 meq via INTRAVENOUS
  Filled 2012-03-11: qty 50

## 2012-03-11 MED ORDER — INSULIN GLARGINE 100 UNIT/ML ~~LOC~~ SOLN
10.0000 [IU] | Freq: Every day | SUBCUTANEOUS | Status: DC
  Administered 2012-03-11: 10 [IU] via SUBCUTANEOUS

## 2012-03-11 MED ORDER — MORPHINE SULFATE 2 MG/ML IJ SOLN
1.0000 mg | INTRAMUSCULAR | Status: DC | PRN
  Administered 2012-03-11 – 2012-03-12 (×5): 1 mg via INTRAVENOUS
  Filled 2012-03-11 (×4): qty 1

## 2012-03-11 MED ORDER — SODIUM POLYSTYRENE SULFONATE 15 GM/60ML PO SUSP
30.0000 g | Freq: Once | ORAL | Status: AC
  Administered 2012-03-11: 30 g via ORAL
  Filled 2012-03-11: qty 120

## 2012-03-11 MED ORDER — ALBUTEROL SULFATE (5 MG/ML) 0.5% IN NEBU
10.0000 mg | INHALATION_SOLUTION | Freq: Once | RESPIRATORY_TRACT | Status: AC
  Administered 2012-03-11: 10 mg via RESPIRATORY_TRACT
  Filled 2012-03-11: qty 2

## 2012-03-11 MED ORDER — SODIUM BICARBONATE 8.4 % IV SOLN
INTRAVENOUS | Status: AC
  Filled 2012-03-11: qty 50

## 2012-03-11 MED ORDER — POTASSIUM CHLORIDE CRYS ER 20 MEQ PO TBCR
40.0000 meq | EXTENDED_RELEASE_TABLET | ORAL | Status: AC
  Administered 2012-03-11 – 2012-03-12 (×2): 40 meq via ORAL
  Filled 2012-03-11 (×2): qty 2

## 2012-03-11 MED ORDER — METOPROLOL TARTRATE 25 MG PO TABS
25.0000 mg | ORAL_TABLET | Freq: Two times a day (BID) | ORAL | Status: DC
  Administered 2012-03-12: 25 mg via ORAL
  Filled 2012-03-11 (×4): qty 1

## 2012-03-11 MED ORDER — DEXTROSE 5 % IV SOLN
INTRAVENOUS | Status: DC
  Administered 2012-03-11: 06:00:00 via INTRAVENOUS

## 2012-03-11 MED ORDER — POTASSIUM CHLORIDE 10 MEQ/50ML IV SOLN
10.0000 meq | INTRAVENOUS | Status: AC
  Administered 2012-03-11 (×2): 10 meq via INTRAVENOUS
  Filled 2012-03-11 (×2): qty 50

## 2012-03-11 MED ORDER — SODIUM CHLORIDE 0.9 % IJ SOLN
10.0000 mL | INTRAMUSCULAR | Status: DC | PRN
  Administered 2012-03-11: 20 mL
  Administered 2012-03-11 – 2012-03-14 (×2): 10 mL

## 2012-03-11 MED ORDER — MORPHINE SULFATE 2 MG/ML IJ SOLN
INTRAMUSCULAR | Status: AC
  Filled 2012-03-11: qty 1

## 2012-03-11 MED ORDER — ASPIRIN EC 81 MG PO TBEC
81.0000 mg | DELAYED_RELEASE_TABLET | Freq: Every day | ORAL | Status: DC
  Administered 2012-03-12 (×2): 81 mg via ORAL
  Filled 2012-03-11 (×2): qty 1

## 2012-03-11 MED ORDER — ATORVASTATIN CALCIUM 20 MG PO TABS
20.0000 mg | ORAL_TABLET | Freq: Every day | ORAL | Status: DC
  Administered 2012-03-12 – 2012-03-13 (×3): 20 mg via ORAL
  Filled 2012-03-11 (×5): qty 1

## 2012-03-11 MED ORDER — MORPHINE SULFATE 4 MG/ML IJ SOLN
INTRAMUSCULAR | Status: AC
  Filled 2012-03-11: qty 1

## 2012-03-11 NOTE — Progress Notes (Signed)
eLink Physician-Brief Progress Note Patient Name: Greg Robertson DOB: 1950-08-16 MRN: [id number]  Date of Service  03/11/2012   HPI/Events of Note   Hyperkalemia; no additional K given tonight  eICU Interventions  Kayexalate, Bicarb given; currently on insulin drip at 4.2 U/h.  EKG stat ordered and pending      [facility] 03/11/2012, 12:47 AM

## 2012-03-11 NOTE — Progress Notes (Signed)
Patient transferred to 6N18 by wheelchair.  Cardiac monitoring started Tele box 6N18, NSR.  Patient and family oriented to room and unit.  Vital signs stable, no complaints of pain.  Will continue to monitor.

## 2012-03-11 NOTE — Consult Note (Addendum)
CARDIOLOGY CONSULT NOTE  Patient ID: STEEN BISIG, MRN: [id number], DOB/AGE: 61-Jan-1952 61 y.o. Admit date: 03/09/2012 Date of Consult: 03/11/2012  Primary Physician: Rudi Heap, MD Primary Cardiologist: None  Chief Complaint: Altered mental status Reason for Consultation: A.fib, elevated troponins  HPI: 61 y.o. male w/ PMHx significant for HTN, DM, colon CA, Anemia, and recent mechanical fall w/ left clavicular fracture who presented to [facility] on 03/10/2012 with altered mental status in the setting of dehydration and DKA.  No prior cardiac history. Had stress test >32yrs for routine check up that was normal. On 03/08/12 suffered a left clavicular fracture after a mechanical fall in his home. His PCP prescribed Hydrocodone that he took on an empty stomach with resultant profuse vomiting. No hematemesis, diarrhea, abd pain or fever. He became weak to the point he was walking from the kitchen to the living room and had to lay on the floor. He was found by his father about one hour later and transported to [facility]. Denies chest pain, palpitations, or syncope. Finished chemo in September. Prior to treatment he was able to exercise consistently without chest pain or sob. No h/o CVA or CHF. Does not snore. Occasional ETOH. Smokes marijuana. No other illicit drugs.  In the ED, EKG showed A.Fib 88bpm, RBBB, inferior TWI. CXR was without acute cardiopulmonary findings. Labs were significant for glucose 1277, WBC 12.1, hgb 11.7, Plt 119, Na 112, K+ 7.5, BUN/Crt 57/3.4, Ca 7.2. ABG showed pH 7.077, CO2 14.2, HCO3 4.2. He was admitted with DKA to the PCCM service. His electrolytes were corrected and he was treated with Insulin drip with improvement in glucose and mentation. Crt has improved with hydration. He spontaneously converted to NSR with resolution of RBBB and TWI. Cardiac enzymes were cycled with troponin 1.57-->1.0. Echo shows EF 55%, No WMAs, mild diastolic dysfunction, no  significant valvular abnls. Cardiology is asked to assist in management of A.fib and elevated troponins.    Past Medical History  Diagnosis Date  . Diabetes mellitus   . Glaucoma(365)   . Allergic rhinitis   . Prostate nodule   . Colonic mass   . Cancer   . Substance abuse   . Cancer of ascending colon, 7cm 05/25/2011  . Hypertension   . Pneumonia 1979 or 1980  . Anemia       Surgical History:  Past Surgical History  Procedure Date  . Finger surgery 2009    right middle  . Nasal fracture surgery 1968  . Tonsillectomy 1957 - approximate  . 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 Portacath placement 07/01/2011    Procedure: INSERTION PORT-A-CATH;  Surgeon: Ardeth Sportsman, MD;  Location: [facility] ORS;  Service: General;  Laterality: Left;  Insertion of Port-A-Catheter Left Internal Jugular     Home Meds: Medication Sig  ACCU-CHEK AVIVA PLUS test strip Daily.  acetaminophen (TYLENOL) 500 MG tablet Take 1,000 mg by mouth every 6 (six) hours as needed. Pain   Ascorbic Acid (VITAMIN C PO) Take 1 tablet by mouth every morning.   Aspirin (ECOTRIN PO) Take 81 mg by mouth daily before breakfast. STOP ASA TODAY  brimonidine-timolol (COMBIGAN) 0.2-0.5 % ophthalmic solution Place 1 drop into both eyes every 12 (twelve) hours.   ferrous sulfate 325 (65 FE) MG tablet Take 1 tablet (325 mg total) by mouth 2 (two) times daily.  HYDROcodone-acetaminophen (VICODIN) 5-500 MG per tablet Take 1 tablet by mouth every 6 (six) hours as needed. For pain  insulin aspart (NOVOLOG) 100 UNIT/ML injection Inject 1-15 Units into the skin 3 (three) times daily before meals. Take 1 unit for ever 6 grams of carbs before each meal  insulin glargine (LANTUS) 100 UNIT/ML injection Inject 22 Units into the skin at bedtime.  latanoprost (XALATAN) 0.005 % ophthalmic solution Place 1 drop into both eyes at bedtime.  levocetirizine (XYZAL) 5 MG tablet Take 5 mg by mouth daily before breakfast.    LISINOPRIL PO Take 5 mg by mouth daily before breakfast.   Multiple Vitamin (MULTIVITAMIN) tablet Take 1 tablet by mouth every morning.   SIMVASTATIN PO Take 10 mg by mouth daily before breakfast.   temazepam (RESTORIL) 15 MG capsule Take 15 mg by mouth at bedtime as needed. For sleep  triamcinolone (NASACORT) 55 MCG/ACT nasal inhaler Place 1 spray into the nose every morning. Allergies   VITAMIN D, CHOLECALCIFEROL, PO Take 2,000 Units by mouth every morning. Vitamin D 3  temazepam (RESTORIL) 15 MG capsule Take 1 capsule (15 mg total) by mouth at bedtime as needed for sleep.    Inpatient Medications:   . [COMPLETED] albuterol  10 mg Nebulization Once  . brimonidine  1 drop Both Eyes Q12H   And  . timolol  1 drop Both Eyes BID  . heparin  5,000 Units Subcutaneous Q8H  . insulin aspart  0-15 Units Subcutaneous TID WC  . insulin glargine  10 Units Subcutaneous Daily  . latanoprost  1 drop Both Eyes QHS  . loratadine  10 mg Oral QAC breakfast  . [COMPLETED] morphine      . [COMPLETED] morphine      . [COMPLETED] potassium chloride  10 mEq Intravenous Q1 Hr x 2  . [COMPLETED] sodium bicarbonate      . [COMPLETED] sodium bicarbonate  50 mEq Intravenous Once  . [COMPLETED] sodium polystyrene  30 g Oral Once  . [DISCONTINUED] insulin glargine  15 Units Subcutaneous Daily     Allergies: No Known Allergies  History   Social History  . Marital Status: Single    Spouse Name: N/A    Number of Children: 0  . Years of Education: N/A   Occupational History  . sales    Social History Main Topics  . Smoking status: Former Smoker -- 1.0 packs/day for 15 years    Types: Cigarettes    Quit date: 04/18/1993  . Smokeless tobacco: Never Used     Comment: marijuana every night   . Alcohol Use: Yes     Comment: 1 drink every 2 days  . Drug Use: Yes    Special: Marijuana     Comment: once a night marijuana  . Sexually Active: Not on file   Other Topics Concern  . Not on file    Social History Narrative  . No narrative on file     Family History  Problem Relation Age of Onset  . Colon polyps Father   . Lung cancer Father   . Diabetes Father   . Breast cancer Mother   . Pancreatitis Mother     intestinal adhesions     Review of Systems: General: negative for chills, fever, night sweats or weight changes.  Cardiovascular: negative for chest pain, shortness of breath, dyspnea on exertion, edema, orthopnea, palpitations, or paroxysmal nocturnal dyspnea Dermatological: negative for rash Respiratory: negative for cough or wheezing Urologic: negative for hematuria Abdominal: As per HPI Neurologic: negative for visual changes, syncope, or dizziness All other systems reviewed and are otherwise negative except  as noted above.  Labs:  [facility] 03/10/12 1303 03/10/12 0533  TROPONINI 1.00* 1.57*   Component Value Date   WBC 3.5* 03/11/2012   HGB 10.4* 03/11/2012   HCT 28.1* 03/11/2012   MCV 87.8 03/11/2012   PLT 73* 03/11/2012    Lab 03/11/12 0700  NA 136  K 3.7  CL 100  CO2 26  BUN 32*  CREATININE 1.47*  CALCIUM 8.0*  GLUCOSE 167*   Radiology/Studies:   03/09/2012  - PORTABLE CHEST - 1 VIEW   Findings: Left IJ port catheter extends to the left innominate vein/SVC junction.  Lungs clear.  No definite effusion although the right lateral costophrenic angle is excluded.  Heart size normal. Regional bones unremarkable.  IMPRESSION:  No acute disease     03/11/12 - Echo Study Conclusions: - Left ventricle: The cavity size was at the upper limits of normal. Wall thickness was normal. The estimated ejection fraction was 55%. Wall motion was normal; there were no regional wall motion abnormalities. Doppler parameters are consistent with abnormal left ventricular relaxation (grade 1 diastolic dysfunction). - Impressions: Technically limited study due to poor sound wave transmission.   EKG: 03/09/12 - A.Fib 88bpm, RBBB, inferior TWI  03/11/13 - NSR  98bpm, resolution of TWI and RBBB  Physical Exam: Blood pressure 146/76, pulse 102, temperature 99.7 F (37.6 C), temperature source Oral, resp. rate 18, height 6\' 3"  (1.905 m), weight 175 lb 0.7 oz (79.4 kg), SpO2 98.00%. General: Pleasant white male in no acute distress. Head: Normocephalic, atraumatic, sclera non-icteric, no xanthomas, nares are without discharge.  Neck: Supple. Negative for carotid bruits. No JVD Lungs: Clear bilaterally to auscultation without wheezes, rales, or rhonchi. Breathing is unlabored. Chest: Left chest portacath Heart: RRR with S1 S2. No murmurs, rubs, or gallops appreciated. Abdomen: Soft, non-tender, non-distended with normoactive bowel sounds. No hepatomegaly. No rebound/guarding. No obvious abdominal masses. Msk:  Strength and tone appear normal for age. Extremities: Left arm in sling. No clubbing or cyanosis. 1+ BLE edema.  Distal pedal pulses are intact and equal bilaterally. Neuro: Alert and oriented X 3. Moves all extremities spontaneously. Psych:  Responds to questions appropriately with a normal affect.   Assessment and Plan:  61 y.o. male w/ PMHx significant for HTN, DM, colon CA, Anemia, and recent mechanical fall w/ left clavicular fracture who presented to [facility] on 03/10/2012 with altered mental status in the setting of dehydration and DKA.  1. Altered Mental Status 2. Diabetic Ketoacidosis 3. Severe Dehydration 4. Acute Renal Failure 5. Atrial Fibrillation, newly diagnosed this admission 6. RBBB 7. Elevated Troponins 8. Hyperkalemia 9. Hypokalemia 10. Hyponatremia 11. Anemia 12. Thrombocytopenia 13. Left Clavicular Fracture  Patient presented with AMS in the setting of severe dehydration and DKA. He was found to be in A.fib w/ RBBB and inferior TWI which was a new finding. He spontaneously converted to NSR with treatment of metabolic abnormalities. RBBB and TWI resolved. No further episodes of A.Fib. Troponins were  elevated 1.57 --> 1.0 likely demand ischemia. No anginal symptoms. Echo w/ nl EF and no WMAs. CHADS2 score 2 for HTN and DM. Given new onset of a.fib in the setting of acute illness I am not sure he needs chronic anticoagulation. MD advise. Will check troponin with CKMB, TSH. Will need cath versus lexiscan myoview for further ischemic evaluation. Cont to correct electrolytes.   Signed, HOPE, JESSICA PA-C 03/11/2012, 3:34 PM  Patient seen and examined with Shanda Bumps hope, PA-C. We discussed all aspects of the  encounter. I agree with the assessment and plan as stated above.   He has had a small NSTEMI and transient episode of AF in the setting of marked systemic stress with recent fracture and DKA. Echo and ECG are normal (reviewed personally). Now back in NSR on tele.    Would start ASA, low dose blocker and statin. Once he recovers will need further ischemic evaluation with cath versus stress testing. Given CRFs, I would favor cath but will need to see how he does.   With regards to AF, not good anticoagulation candidate currently with 2 recent falls so would continue ASA for now until he stability improves.   We will follow.    Elin Fenley,MD 4:54 PM

## 2012-03-11 NOTE — Progress Notes (Signed)
eLink Physician-Brief Progress Note Patient Name: Greg Robertson DOB: December 20, 1950 MRN: [id number]  Date of Service  03/11/2012   HPI/Events of Note   Now hypokalemia with increasing glycemia.   eICU Interventions  Change D10 to D5. Repeat BMP, transition with next labs if CBG<200 and gap closed.  K replaced       Jehan Bonano 03/11/2012, 5:59 AM

## 2012-03-11 NOTE — Progress Notes (Signed)
[facility] ADULT ICU REPLACEMENT PROTOCOL FOR AM LAB REPLACEMENT ONLY  The patient does not apply for the [facility] Adult ICU Electrolyte Replacment Protocol based on the criteria listed below:   3. Is BUN < 30 mg/dL? no  Patient's BUN today is 39 4. Abnormal electrolyte(s): K+ 3.2 5. Ordered repletion with: none 6. If a panic level lab has been reported, has the CCM MD in charge been notified? no.   Physician:  Dr Autumn Messing, Einar Gip 03/11/2012 5:42 AM

## 2012-03-11 NOTE — Progress Notes (Signed)
CRITICAL VALUE ALERT  Critical value received:  MB 6.3  Date of notification:  03/11/12  Time of notification:  1810  Critical value read back:yes  Nurse who received alert:  Bonna Gains RN  MD notified (1st page):  [facility] PA  Time of first page: 1814  MD notified (2nd page):  Time of second page:  Responding MD: Berton Mount PA  Time MD responded:  [phone]

## 2012-03-11 NOTE — Progress Notes (Signed)
eLink Physician-Brief Progress Note Patient Name: Greg Robertson DOB: 11-10-1950 MRN: [id number]  Date of Service  03/11/2012   HPI/Events of Note   Shoulder pain   eICU Interventions  Started Morphine for prn use.       [name] 03/11/2012, 1:27 AM

## 2012-03-11 NOTE — Progress Notes (Signed)
CRITICAL VALUE ALERT  Critical value received: elevated potassium 6.9  Date of notification:  1124  Time of notification:  0035  Critical value read back:yes  Nurse who received alert:  Corliss Skains RN  MD notified (1st page):  Dr. Frederico Hamman  Time of first page: 0037  MD notified (2nd page):  Time of second page:  Responding MD:  Dr. Frederico Hamman  Time MD responded:  [phone]

## 2012-03-11 NOTE — Progress Notes (Signed)
  Echocardiogram 2D Echocardiogram has been performed.  Greg Robertson 03/11/2012, 2:52 PM

## 2012-03-11 NOTE — Progress Notes (Addendum)
PULMONARY  / CRITICAL CARE MEDICINE  Name: Greg Robertson MRN: [id number] DOB: [birth year]    LOS: 2  REFERRING MD :  none  CHIEF COMPLAINT:  DKA  BRIEF PATIENT DESCRIPTION: 61 year old M with colon cancer and recent clavicle fracture who presented to the ED in DKA (Glu 1277, pH 7.007, HCO3 4.2) and altered mental status who was admitted to the ICU for further care.   INTERVAL HISTORY: RN notes hypokalemia, increased CBG   LINES / TUBES: PIV  CULTURES: Urine Culture 11/22>>>  ANTIBIOTICS: none  SIGNIFICANT EVENTS:  11/21- Clavicle fracture in outpatient setting 11/22 - admitted with severe DKA 11/23 - positive troponin, peak 1.57  LEVEL OF CARE:  ICU PRIMARY SERVICE:  PCCM CONSULTANTS:  None CODE STATUS: Full DIET:  NPO DVT Px:  SCD GI Px:  none   VITAL SIGNS: Temp:  [98 F (36.7 C)-99.1 F (37.3 C)] 99.1 F (37.3 C) (11/24 0428) Pulse Rate:  [97-106] 104  (11/24 0700) Resp:  [14-29] 17  (11/24 0700) BP: (116-148)/(52-86) 146/74 mmHg (11/24 0700) SpO2:  [94 %-100 %] 99 % (11/24 0700) Weight:  [175 lb 0.7 oz (79.4 kg)] 175 lb 0.7 oz (79.4 kg) (11/24 0437) HEMODYNAMICS:   VENTILATOR SETTINGS:   INTAKE / OUTPUT: Intake/Output      11/23 0701 - 11/24 0700 11/24 0701 - 11/25 0700   I.V. (mL/kg) 2848.1 (35.9)    IV Piggyback 50 50   Total Intake(mL/kg) 2898.1 (36.5) 50 (0.6)   Urine (mL/kg/hr) 3800 (2)    Total Output 3800    Net -902 +50          PHYSICAL EXAMINATION: General:  61 year old WM, no distress Neuro:  Awake, alert and oriented x 4, no focal deficits HEENT: NCAT, PERRA, MM dry, no JVD, no lymphadenopathy Cardiovascular: sinus tachycardia, no murmurs, palpable port-a-cath left anterior chest  Lungs: mildly increased WOB, lungs CTA-B Abdomen:  Soft, NDNT Musculoskeletal: diffuse ecchymosis and swelling of left shoulder, no step off deformity of left clavicle; arm in sling, non pitting edema of LUE; no edema of LE Skin:  No rashes or  lesions Genitourinary: Foley catheter in place   LABS: cbc  Lab 03/11/12 0210 03/10/12 0533 03/10/12 0050  WBC 3.5* 4.4 6.4  HGB 10.4* 11.0* 12.0*  HCT 28.1* 29.6* 35.4*   chemistry  Lab 03/11/12 0455 03/11/12 0210 03/10/12 2330 03/10/12 0533  NA 135 136 132* --  K 2.9* 3.2* 6.9* --  CO2 24 24 16* --  GLUCOSE 256* 146* 174* --  BUN 37* 39* 44* --  CREATININE 1.58* 1.67* 1.79* --  CALCIUM 8.0* 8.0* 7.7* --  MG -- -- -- 2.1  PHOS -- -- -- 3.8   Liver fxn No results found for this basename: AST:3,ALT:3,ALKPHOS:3,BILITOT:3,PROT:3,ALBUMIN:3 in the last 168 hours coags No results found for this basename: APTT:3,INR:3 in the last 168 hours Sepsis markers  Lab 03/10/12 0533  LATICACIDVEN 1.8  PROCALCITON --   Cardiac markers  Lab 03/10/12 1303 03/10/12 0533  CKTOTAL -- --  CKMB -- --  TROPONINI 1.00* 1.57*   BNP No results found for this basename: PROBNP:3 in the last 168 hours ABG  Lab 03/09/12 2242  PHART 7.077*  PCO2ART 14.2*  PO2ART 110.0*  HCO3 4.2*  TCO2 <5   CBG trend  Lab 03/11/12 0702 03/11/12 0555 03/11/12 0435 03/11/12 0343 03/11/12 0245  GLUCAP 160* 220* 256* 221* 175*    IMAGING:  ECG:  Date: 03/09/2012  Rate: 92  Rhythm: atrial fibrillation  QRS Axis: normal  Intervals: PR no calculated, prolonged QRS  ST/T Wave abnormalities: nonspecific ST changes  Conduction Disutrbances:right bundle branch block  Narrative Interpretation: new onset a fib w/ conduction delay, setting of K+ 7.5  Old EKG Reviewed: previous ECG shows NSR   Date: 03/10/2012  Rate: 98  Rhythm: normal sinus rhythm  QRS Axis: normal  Intervals: normal  ST/T Wave abnormalities: normal  Conduction Disutrbances:none  Narrative Interpretation: resolved a fib with RBBB from previous ECG  Old EKG Reviewed: changes noted   DIAGNOSES: Principal Problem:  *Diabetic ketoacidosis Active Problems:  Dehydration, severe  Right bundle branch block  Atrial fibrillation by  electrocardiogram  Elevated troponin I level   ASSESSMENT / PLAN:  PULMONARY  ASSESSMENT:  No acute respiratory compromise  PLAN:   -Continue to monitor, low threshold for repeat CXR  -Keep O2 sat > 92%  CARDIOVASCULAR  ASSESSMENT:  A fib w/ RBBB - resolved Elevated troponin w/out EKG changes - likely demand ischemia   PLAN:  -Will hold anticoagulation and cards consult for now as Trop peaked at 1.57  and no EKG changes -monitor for EKG changes  RENAL  ASSESSMENT:   AKI, likely secondary to hypovolemia Electrolyte abnormalities - Na+, K+  PLAN:   -Creat improving, cont to trend -Monitor lytes, replete K+ per protocol  -Pure AG acidosis, so no role of HCO3 drip  GASTROINTESTINAL  ASSESSMENT:   Colon Cancer Nausea and vomiting Mildly elevated Lipase, likely not true pancreatitis  PLAN:   -Alert oncology, Dr. Myrle Sheng, of patient's admission -Zofran PRN  HEMATOLOGIC  ASSESSMENT:  No acute issues  PLAN:  -Monitor CBC  INFECTIOUS  ASSESSMENT:   No clear infectious etiology  PLAN:   -F/u urine cx, low threshold for wide spectrum antibiotics if develops fever  ENDOCRINE  ASSESSMENT:  Severe DKA  PLAN:  -Change fluid to NS -transition to SSI 11/24  NEUROLOGIC  ASSESSMENT:   Improved mentation  PLAN:   -Continue to monitor  ORTHO  ASSESSMENT:  Left clavicular fracture - appears lined up, tolerating sling & PRN pain medications -Ortho Consult in am 11/25, no emergent surgical need.  Global: 61 year old M w/ severe DKA after persistent vomiting who is improving on DKA protocol.  F/U labs in am.  Will need Ortho consult in am 11/25.   Transfer to [facility] in AM 11/25 (discussed with Dr. Butler Denmark).  Transfer to [facility] floor.   Canary Brim, NP-C [facility] Pgr: [phone] or [phone]   Attending  I have seen and examined the patient with nurse practitioner/resident and agree with the note above.   Gap closed, still  hyperglycemic.  Start lantus, feed.  Needs outpatient CAD work up.  Yolonda Kida PCCM Pager: [phone] Cell: [phone] If no response, call [phone]

## 2012-03-12 ENCOUNTER — Inpatient Hospital Stay (HOSPITAL_COMMUNITY): Payer: BC Managed Care – PPO

## 2012-03-12 DIAGNOSIS — C182 Malignant neoplasm of ascending colon: Secondary | ICD-10-CM

## 2012-03-12 DIAGNOSIS — E860 Dehydration: Secondary | ICD-10-CM

## 2012-03-12 DIAGNOSIS — S42002A Fracture of unspecified part of left clavicle, initial encounter for closed fracture: Secondary | ICD-10-CM | POA: Diagnosis present

## 2012-03-12 DIAGNOSIS — I248 Other forms of acute ischemic heart disease: Secondary | ICD-10-CM | POA: Diagnosis present

## 2012-03-12 DIAGNOSIS — I5032 Chronic diastolic (congestive) heart failure: Secondary | ICD-10-CM | POA: Diagnosis present

## 2012-03-12 DIAGNOSIS — I509 Heart failure, unspecified: Secondary | ICD-10-CM | POA: Diagnosis present

## 2012-03-12 LAB — GLUCOSE, CAPILLARY: Glucose-Capillary: 400 mg/dL — ABNORMAL HIGH (ref 70–99)

## 2012-03-12 LAB — CK TOTAL AND CKMB (NOT AT ARMC)
CK, MB: 4.1 ng/mL — ABNORMAL HIGH (ref 0.3–4.0)
Relative Index: 0.6 (ref 0.0–2.5)
Total CK: 668 U/L — ABNORMAL HIGH (ref 7–232)

## 2012-03-12 LAB — CBC
HCT: 30.6 % — ABNORMAL LOW (ref 39.0–52.0)
Hemoglobin: 10.9 g/dL — ABNORMAL LOW (ref 13.0–17.0)
MCH: 32.9 pg (ref 26.0–34.0)
MCV: 92.4 fL (ref 78.0–100.0)
Platelets: 59 10*3/uL — ABNORMAL LOW (ref 150–400)
RBC: 3.31 MIL/uL — ABNORMAL LOW (ref 4.22–5.81)
WBC: 3.9 10*3/uL — ABNORMAL LOW (ref 4.0–10.5)

## 2012-03-12 LAB — BASIC METABOLIC PANEL
BUN: 20 mg/dL (ref 6–23)
Chloride: 97 mEq/L (ref 96–112)
Creatinine, Ser: 1.01 mg/dL (ref 0.50–1.35)
Glucose, Bld: 432 mg/dL — ABNORMAL HIGH (ref 70–99)
Potassium: 4 mEq/L (ref 3.5–5.1)

## 2012-03-12 LAB — TROPONIN I: Troponin I: <0.3 ng/mL (ref <0.30)

## 2012-03-12 LAB — PROTIME-INR
INR: 0.92 (ref 0.00–1.49)
Prothrombin Time: 12.3 seconds (ref 11.6–15.2)

## 2012-03-12 IMAGING — CR DG CLAVICLE*L*
2 series · 2 of 2 positions shown · non-contrast
Comparison: Radiographs dated [DATE]

CLINICAL DATA: Pain and swelling and bruising at the left shoulder.
Known distal left clavicle fracture.

LEFT CLAVICLE - 2+ VIEWS

[w clavicle ap left]
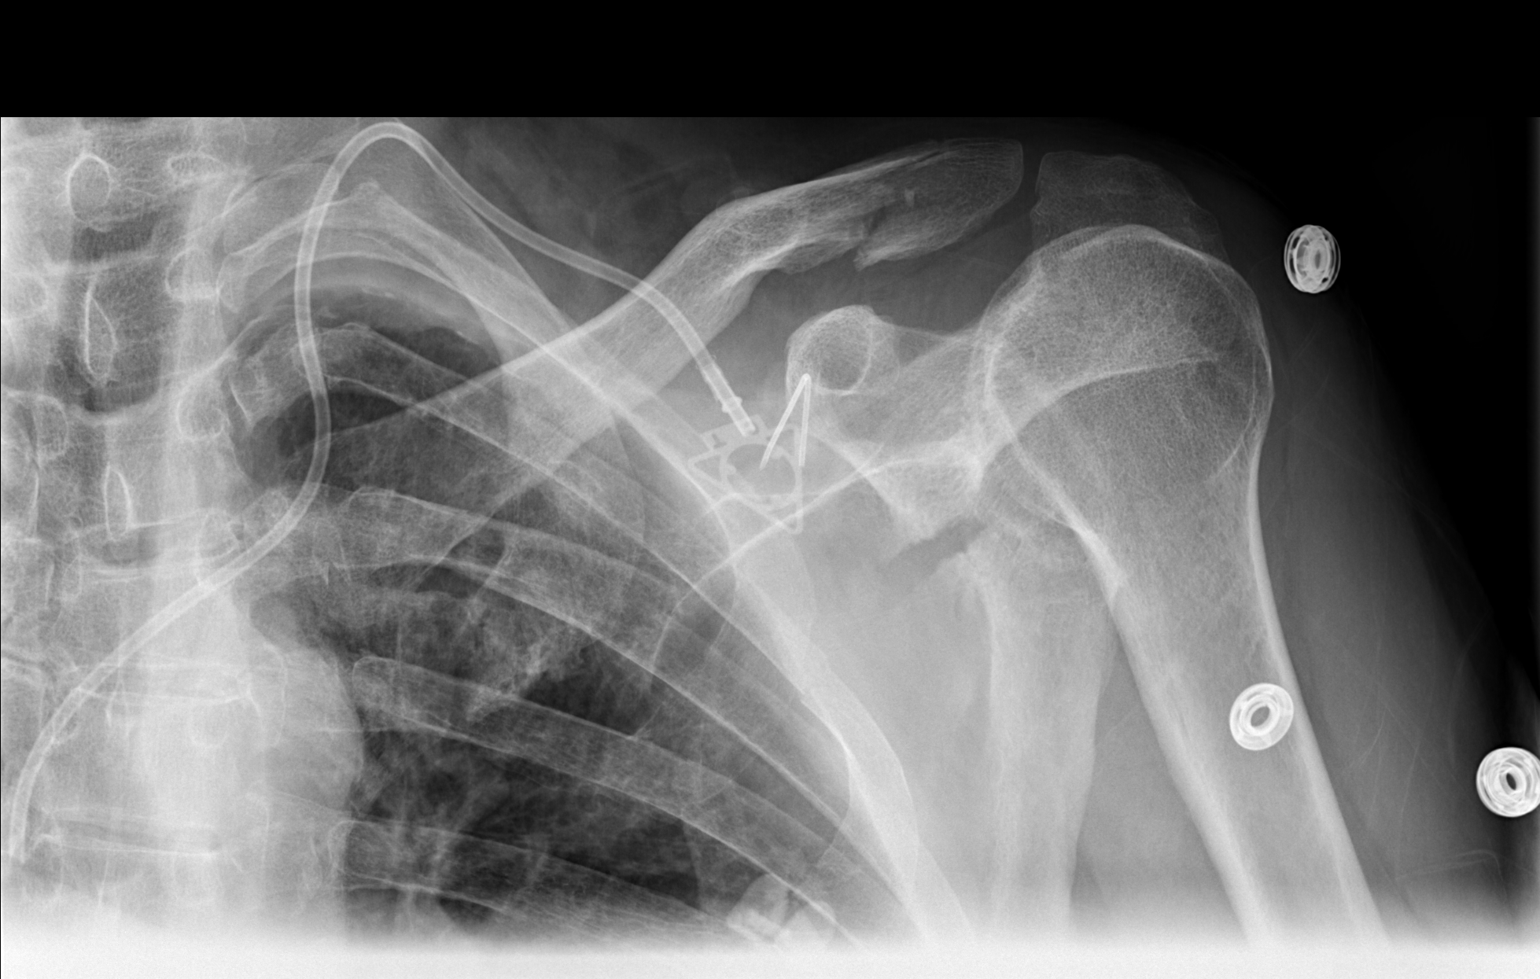

[w clavicle tangential left]
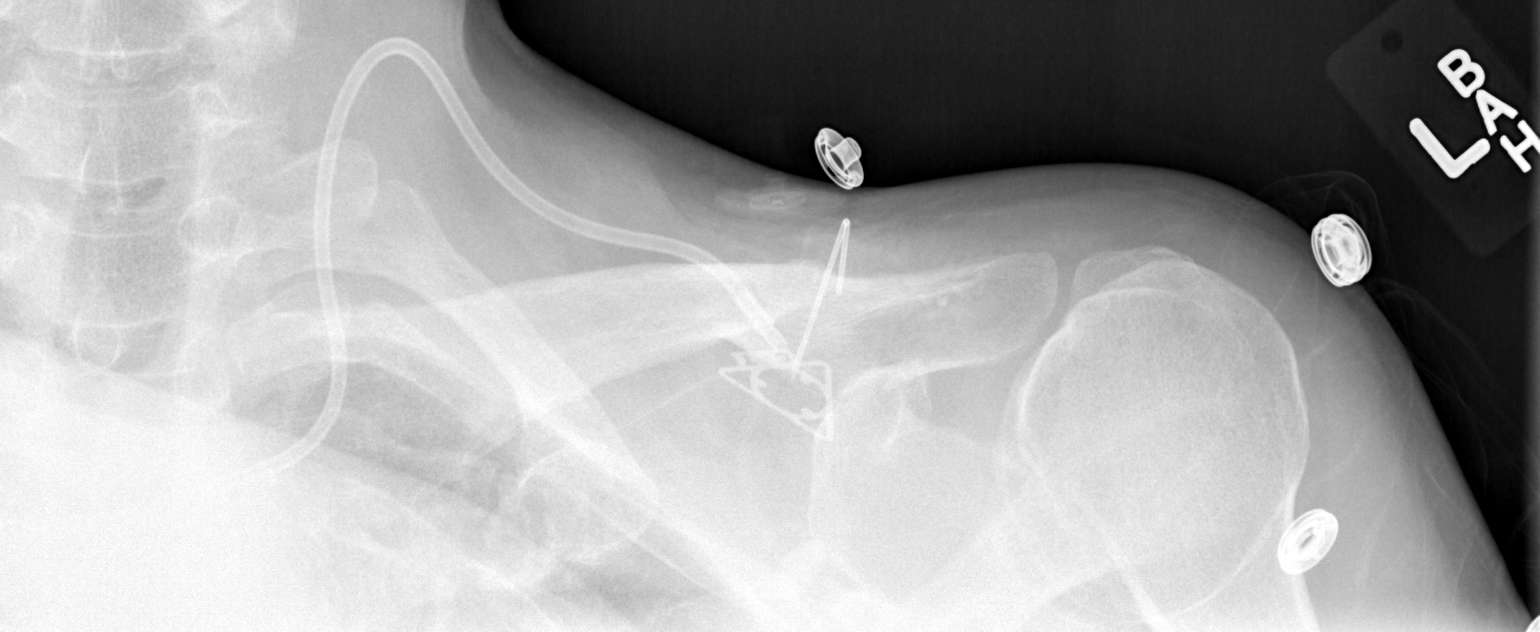

[2 of 2 positions shown; findings below may reference images not displayed]

FINDINGS: Now apparent is a fracture of the scapula.  The fracture
may extend through the glenoid.  I recommend CT scan for further
evaluation of the scapula.

There is improved alignment of the distal clavicle fracture.

Power port is noted. There are fractures of the third and fourth
ribs.
IMPRESSION: Improved alignment and position of the distal clavicle fracture.
Now identified are fractures of the posterior aspects of the left
third and fourth ribs as well as of the scapula.  CT scan may be
useful for further delineation of the scapular fracture to see if
the glenoid is involved.

## 2012-03-12 IMAGING — CR DG SHOULDER 2+V*L*
3 series · 3 of 3 positions shown · non-contrast
Comparison: [DATE]

CLINICAL DATA: Pain, swelling, and bruising is.  Clavicle fracture.

LEFT SHOULDER - 2+ VIEW

[w shoulder ap internal left]
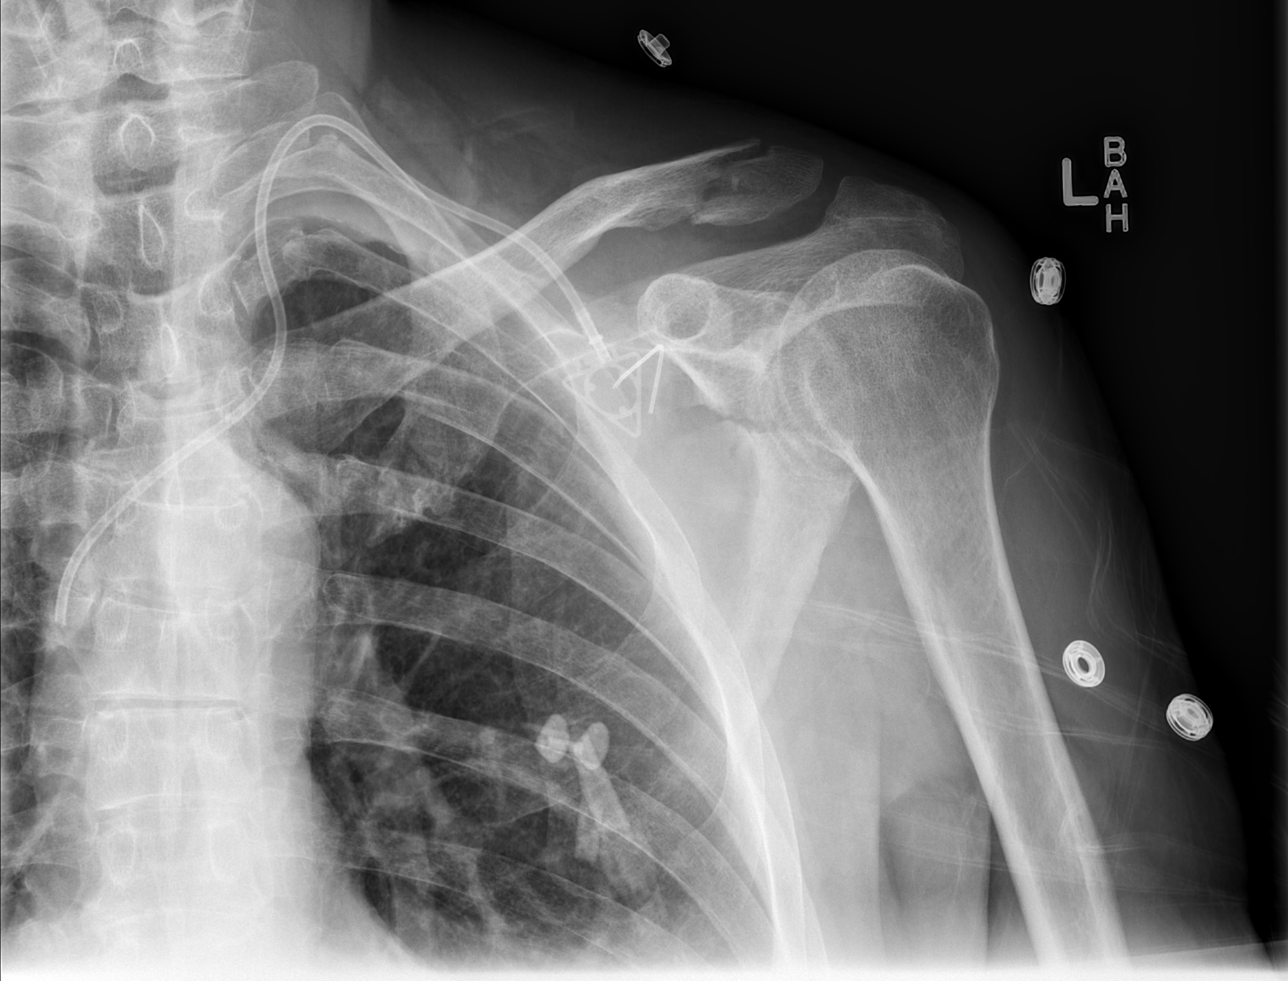

[w shoulder ap external left]
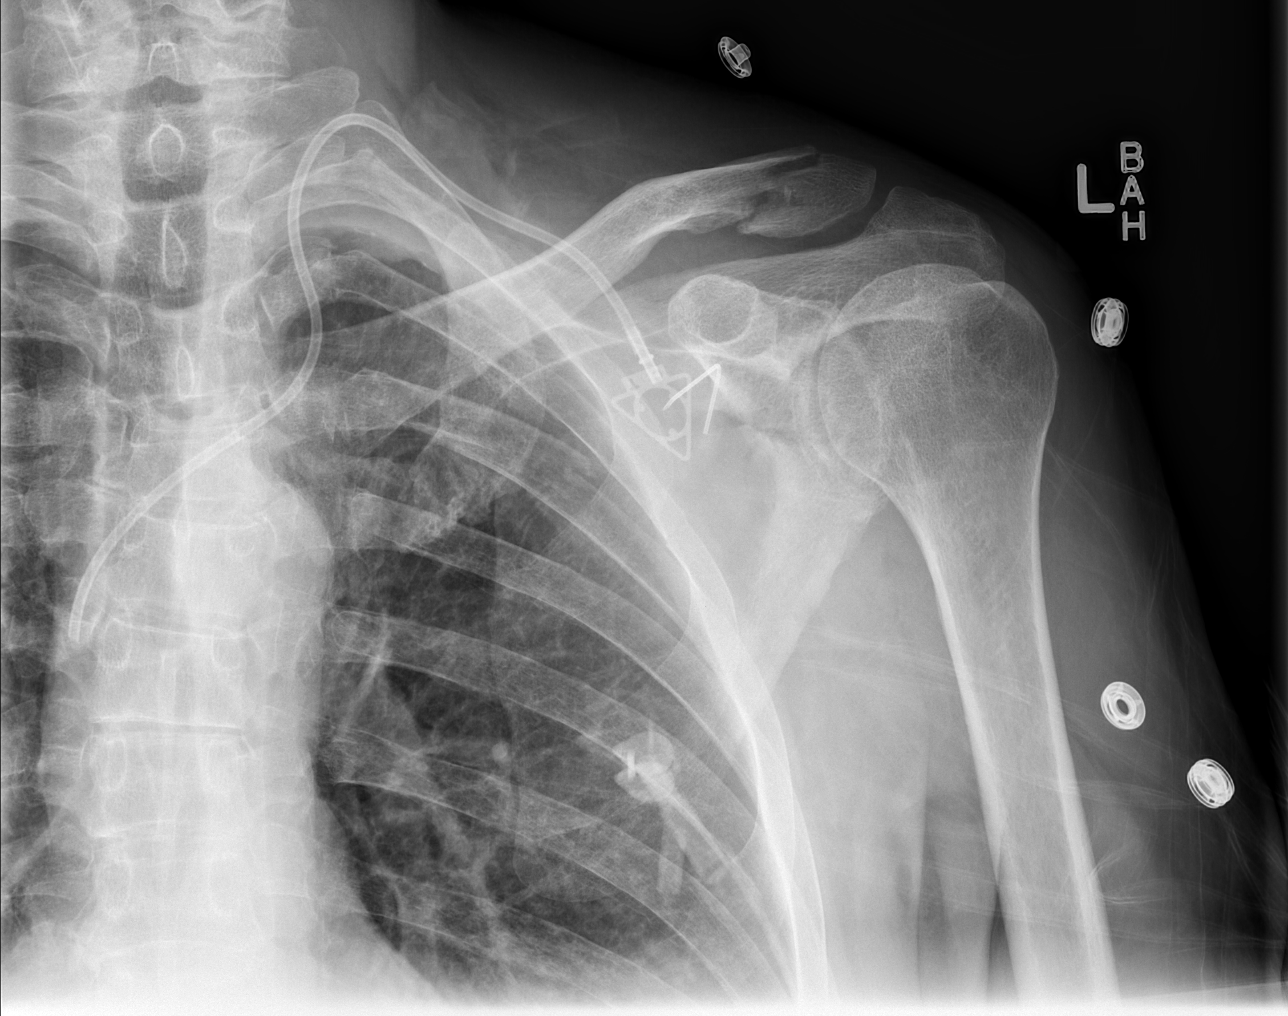

[w shoulder y view left]
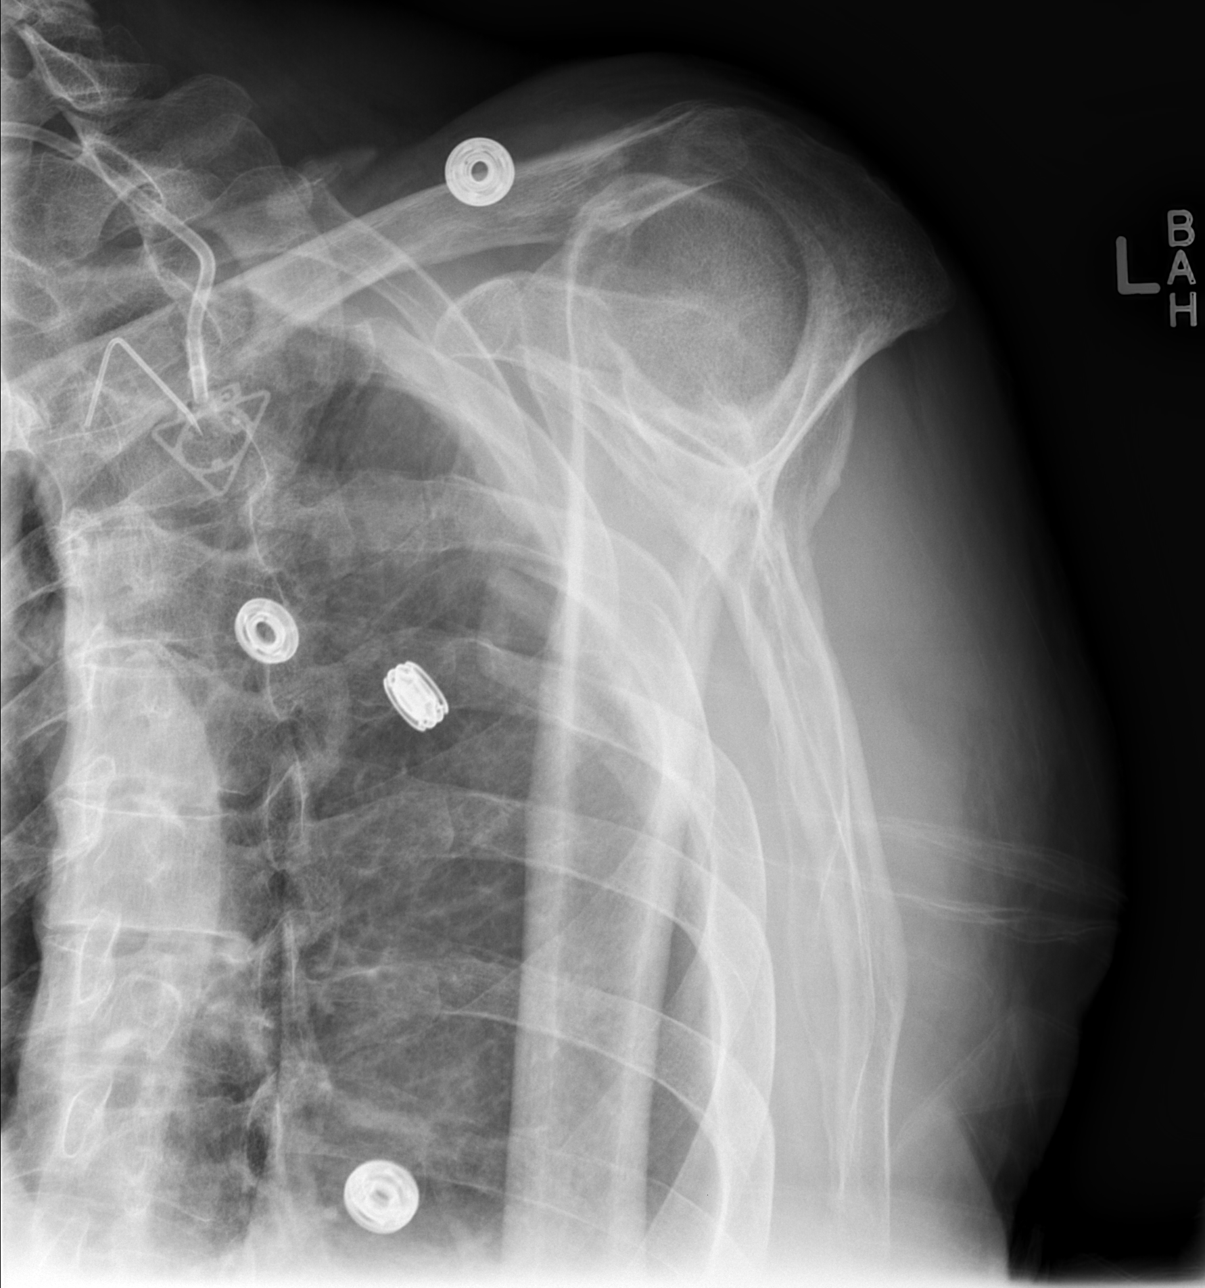

[3 of 3 positions shown; findings below may reference images not displayed]

FINDINGS: Now apparent is a fracture of the scapula which appears
to extend through the glenoid.  There also are fractures of the
posterior aspects of the left third and fourth ribs.  Alignment and
position of the clavicle fracture has improved.
IMPRESSION: Fractures of the scapula, distal clavicle, and left third and
fourth ribs.  Improved alignment and position of the clavicle
fracture.

## 2012-03-12 MED ORDER — APIXABAN 5 MG PO TABS
5.0000 mg | ORAL_TABLET | Freq: Two times a day (BID) | ORAL | Status: DC
  Administered 2012-03-12 – 2012-03-14 (×4): 5 mg via ORAL
  Filled 2012-03-12 (×5): qty 1

## 2012-03-12 MED ORDER — HEPARIN BOLUS VIA INFUSION
2000.0000 [IU] | Freq: Once | INTRAVENOUS | Status: AC
  Administered 2012-03-12: 2000 [IU] via INTRAVENOUS
  Filled 2012-03-12: qty 2000

## 2012-03-12 MED ORDER — SODIUM CHLORIDE 0.9 % IV SOLN
INTRAVENOUS | Status: DC
  Administered 2012-03-12: 20 mL/h via INTRAVENOUS

## 2012-03-12 MED ORDER — INSULIN GLARGINE 100 UNIT/ML ~~LOC~~ SOLN: 15.0000 [IU] | Freq: Every day | SUBCUTANEOUS | Status: DC

## 2012-03-12 MED ORDER — WARFARIN VIDEO: Freq: Once | Status: DC

## 2012-03-12 MED ORDER — DRONEDARONE HCL 400 MG PO TABS
400.0000 mg | ORAL_TABLET | Freq: Two times a day (BID) | ORAL | Status: DC
  Administered 2012-03-12 – 2012-03-14 (×4): 400 mg via ORAL
  Filled 2012-03-12 (×6): qty 1

## 2012-03-12 MED ORDER — HEPARIN (PORCINE) IN NACL 100-0.45 UNIT/ML-% IJ SOLN
1200.0000 [IU]/h | INTRAMUSCULAR | Status: DC
  Administered 2012-03-12: 1200 [IU]/h via INTRAVENOUS
  Filled 2012-03-12 (×2): qty 250

## 2012-03-12 MED ORDER — INSULIN ASPART 100 UNIT/ML ~~LOC~~ SOLN
0.0000 [IU] | Freq: Three times a day (TID) | SUBCUTANEOUS | Status: DC
  Administered 2012-03-12: 15 [IU] via SUBCUTANEOUS
  Administered 2012-03-12: 4 [IU] via SUBCUTANEOUS
  Administered 2012-03-13: 7 [IU] via SUBCUTANEOUS
  Administered 2012-03-13 – 2012-03-14 (×3): 4 [IU] via SUBCUTANEOUS
  Administered 2012-03-14: 6 [IU] via SUBCUTANEOUS

## 2012-03-12 MED ORDER — WARFARIN SODIUM 10 MG PO TABS
10.0000 mg | ORAL_TABLET | Freq: Once | ORAL | Status: DC
Start: 2012-03-12 — End: 2012-03-12
  Filled 2012-03-12: qty 1

## 2012-03-12 MED ORDER — INSULIN ASPART 100 UNIT/ML ~~LOC~~ SOLN: 0.0000 [IU] | Freq: Every day | SUBCUTANEOUS | Status: DC

## 2012-03-12 MED ORDER — OXYCODONE HCL 5 MG PO TABS
5.0000 mg | ORAL_TABLET | ORAL | Status: DC | PRN
  Filled 2012-03-12: qty 1

## 2012-03-12 MED ORDER — GLUCERNA SHAKE PO LIQD
237.0000 mL | Freq: Every day | ORAL | Status: DC
  Administered 2012-03-12 – 2012-03-13 (×2): 237 mL via ORAL

## 2012-03-12 MED ORDER — WARFARIN - PHARMACIST DOSING INPATIENT: Freq: Every day | Status: DC

## 2012-03-12 MED ORDER — INSULIN GLARGINE 100 UNIT/ML ~~LOC~~ SOLN: 30.0000 [IU] | Freq: Every day | SUBCUTANEOUS | Status: DC

## 2012-03-12 MED ORDER — METOPROLOL TARTRATE 1 MG/ML IV SOLN
INTRAVENOUS | Status: AC
  Filled 2012-03-12: qty 5

## 2012-03-12 MED ORDER — DILTIAZEM HCL 25 MG/5ML IV SOLN
10.0000 mg | Freq: Once | INTRAVENOUS | Status: AC
  Administered 2012-03-12: 10 mg via INTRAVENOUS
  Filled 2012-03-12: qty 5

## 2012-03-12 MED ORDER — METOPROLOL TARTRATE 25 MG PO TABS
25.0000 mg | ORAL_TABLET | Freq: Four times a day (QID) | ORAL | Status: DC
  Administered 2012-03-12 (×3): 25 mg via ORAL
  Filled 2012-03-12 (×6): qty 1

## 2012-03-12 MED ORDER — INSULIN GLARGINE 100 UNIT/ML ~~LOC~~ SOLN
30.0000 [IU] | Freq: Every day | SUBCUTANEOUS | Status: DC
  Administered 2012-03-12 – 2012-03-13 (×2): 30 [IU] via SUBCUTANEOUS

## 2012-03-12 MED ORDER — METOPROLOL TARTRATE 1 MG/ML IV SOLN
5.0000 mg | Freq: Once | INTRAVENOUS | Status: AC
  Administered 2012-03-12: 5 mg via INTRAVENOUS

## 2012-03-12 MED ORDER — INSULIN ASPART 100 UNIT/ML ~~LOC~~ SOLN
6.0000 [IU] | Freq: Three times a day (TID) | SUBCUTANEOUS | Status: DC
  Administered 2012-03-12 – 2012-03-13 (×3): 6 [IU] via SUBCUTANEOUS
  Administered 2012-03-13: 09:00:00 via SUBCUTANEOUS
  Administered 2012-03-13 – 2012-03-14 (×3): 6 [IU] via SUBCUTANEOUS

## 2012-03-12 MED ORDER — MORPHINE SULFATE 2 MG/ML IJ SOLN
2.0000 mg | INTRAMUSCULAR | Status: DC | PRN
  Administered 2012-03-12: 2 mg via INTRAVENOUS
  Filled 2012-03-12: qty 1

## 2012-03-12 MED ORDER — DILTIAZEM HCL 100 MG IV SOLR
5.0000 mg/h | INTRAVENOUS | Status: DC
  Administered 2012-03-12: 10 mg/h via INTRAVENOUS
  Administered 2012-03-12 (×3): 20 mg/h via INTRAVENOUS
  Administered 2012-03-13: 10 mg/h via INTRAVENOUS
  Filled 2012-03-12 (×6): qty 100

## 2012-03-12 MED ORDER — ASPIRIN 81 MG PO CHEW
CHEWABLE_TABLET | ORAL | Status: AC
  Administered 2012-03-12: 81 mg
  Filled 2012-03-12: qty 1

## 2012-03-12 MED ORDER — COUMADIN BOOK
Freq: Once | Status: AC
  Administered 2012-03-12: 08:00:00
  Filled 2012-03-12: qty 1

## 2012-03-12 NOTE — Progress Notes (Signed)
INITIAL ADULT NUTRITION ASSESSMENT Date: 03/12/2012   Time: 12:09 PM Reason for Assessment: MST (Malnutrition Screening Tool)   INTERVENTION: 1. Glucerna Shake po daily, each supplement provides 220 kcal and 10 grams of protein.  2. RD will continue to follow     DOCUMENTATION CODES Per approved criteria  -Not Applicable    ASSESSMENT: Male 61 y.o.  Dx: Diabetic ketoacidosis  Hx:  Past Medical History  Diagnosis Date  . Diabetes mellitus   . Glaucoma(365)   . Allergic rhinitis   . Prostate nodule   . Colonic mass   . Cancer   . Substance abuse   . Cancer of ascending colon, 7cm 05/25/2011  . Hypertension   . Pneumonia 1979 or 1980  . Anemia     Past Surgical History  Procedure Date  . Finger surgery 2009    right middle  . Nasal fracture surgery 1968  . Tonsillectomy 1957 - approximate  . 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 Portacath placement 07/01/2011    Procedure: INSERTION PORT-A-CATH;  Surgeon: Ardeth Sportsman, MD;  Location: [facility];  Service: General;  Laterality: Left;  Insertion of Port-A-Catheter Left Internal Jugular    Related Meds:     . [COMPLETED] aspirin      . aspirin EC  81 mg Oral Daily  . atorvastatin  20 mg Oral q1800  . brimonidine  1 drop Both Eyes Q12H   And  . timolol  1 drop Both Eyes BID  . [COMPLETED] coumadin book   Does not apply Once  . [COMPLETED] diltiazem  10 mg Intravenous Once  . [COMPLETED] heparin  2,000 Units Intravenous Once  . insulin aspart  0-20 Units Subcutaneous TID WC  . insulin aspart  0-5 Units Subcutaneous QHS  . insulin aspart  6 Units Subcutaneous TID WC  . insulin glargine  30 Units Subcutaneous QHS  . latanoprost  1 drop Both Eyes QHS  . loratadine  10 mg Oral QAC breakfast  . [COMPLETED] metoprolol      . [COMPLETED] metoprolol  5 mg Intravenous Once  . metoprolol tartrate  25 mg Oral Q6H  . [COMPLETED] potassium chloride  40 mEq Oral Q4H  . warfarin  10 mg Oral  ONCE-1800  . warfarin   Does not apply Once  . Warfarin - Pharmacist Dosing Inpatient   Does not apply q1800  . [DISCONTINUED] heparin  5,000 Units Subcutaneous Q8H  . [DISCONTINUED] insulin aspart  0-15 Units Subcutaneous TID WC  . [DISCONTINUED] insulin glargine  10 Units Subcutaneous Daily  . [DISCONTINUED] insulin glargine  15 Units Subcutaneous Daily  . [DISCONTINUED] metoprolol tartrate  25 mg Oral BID     Ht: 6\' 3"  (190.5 cm)  Wt: 169 lb 15.6 oz (77.1 kg)  Ideal Wt: 89 kg  % Ideal Wt: 87%  Usual Wt:  Wt Readings from Last 10 Encounters:  03/12/12 169 lb 15.6 oz (77.1 kg)  02/07/12 171 lb 14.4 oz (77.973 kg)  01/05/12 168 lb 12.8 oz (76.567 kg)  12/22/11 168 lb (76.204 kg)  12/08/11 166 lb 8 oz (75.524 kg)  11/24/11 167 lb 3.2 oz (75.841 kg)  11/10/11 162 lb 11.2 oz (73.8 kg)  10/27/11 164 lb 9.6 oz (74.662 kg)  10/13/11 162 lb 14.4 oz (73.891 kg)  09/22/11 161 lb 12.8 oz (73.392 kg)    % Usual Wt: >100%  Body mass index is 21.25 kg/(m^2). WNL   Food/Nutrition Related Hx: Pt with good appetite  and weight gain recently.   Labs:  CMP     Component Value Date/Time   NA 138 03/12/2012 0500   NA 138 01/05/2012 0954   K 4.0 03/12/2012 0500   K 4.2 01/05/2012 0954   CL 97 03/12/2012 0500   CL 102 01/05/2012 0954   CO2 27 03/12/2012 0500   CO2 27 01/05/2012 0954   GLUCOSE 432* 03/12/2012 0500   GLUCOSE 278* 01/05/2012 0954   BUN 20 03/12/2012 0500   BUN 18.0 01/05/2012 0954   CREATININE 1.01 03/12/2012 0500   CREATININE 1.0 01/05/2012 0954   CALCIUM 8.7 03/12/2012 0500   CALCIUM 9.4 01/05/2012 0954   PROT 6.2* 01/05/2012 0954   PROT 6.3 12/08/2011 0844   ALBUMIN 3.9 01/05/2012 0954   ALBUMIN 3.8 12/08/2011 0844   AST 24 01/05/2012 0954   AST 33 12/08/2011 0844   ALT 21 01/05/2012 0954   ALT 30 12/08/2011 0844   ALKPHOS 122 01/05/2012 0954   ALKPHOS 130* 12/08/2011 0844   BILITOT 0.50 01/05/2012 0954   BILITOT 0.4 12/08/2011 0844   GFRNONAA 78* 03/12/2012 0500   GFRAA  >90 03/12/2012 0500      Intake/Output Summary (Last 24 hours) at 03/12/12 1211 Last data filed at 03/12/12 0800  Gross per 24 hour  Intake 900.75 ml  Output   3100 ml  Net -2199.25 ml     Diet Order: Dysphagia 3, thin liquids   Supplements/Tube Feeding: none   IVF:    sodium chloride Last Rate: 40 mL/hr (03/12/12 0851)  diltiazem (CARDIZEM) infusion Last Rate: 20 mg/hr (03/12/12 0800)  heparin Last Rate: 1,200 Units/hr (03/12/12 0928)    Estimated Nutritional Needs:   Kcal: 2200-2400 Protein:  85-95 gm  Fluid:  2.2-2.4 L   Pt admitted with DKA. Per pt he took a pain pill after having broke his collar bone on an empty stomach and started to vomit.  Pt with hx of colon cancer, was being followed by RD from cancer center for weight loss. Pt states that until he vomited he was eating well and gaining weight. When he vomited the acid burned his mouth and now is very sore. Has been able to eat, but with some difficulty.  Pt is agreeable to Glucerna shakes once daily.   NUTRITION DIAGNOSIS: Inadequate oral intake related to recent vomiting as evidenced by decreased meal completion.    MONITORING/EVALUATION(Goals): Goal: PO intake to meet >/-90% estimated nutrition needs  Monitor: PO intake, weight, labs  EDUCATION NEEDS: -No education needs identified at this time    Clarene Duke RD, LDN Pager [phone] After Hours pager [phone]  03/12/2012, 12:09 PM

## 2012-03-12 NOTE — Progress Notes (Signed)
eLink Physician-Brief Progress Note Patient Name: Greg Robertson DOB: 08/18/1950 MRN: [id number]  Date of Service  03/12/2012   HPI/Events of Note  Call from bedside nurse that patient is in AF/RVR with rate in the 160s.  Patient is HD stable with systolic BP of 140.  In NAD   eICU Interventions  Plan: Known AF One time bolus of dilt 10 mg IV Continue to monitor   Intervention Category Intermediate Interventions: Arrhythmia - evaluation and management  DETERDING,ELIZABETH 03/12/2012, 2:35 AM

## 2012-03-12 NOTE — Progress Notes (Signed)
ANTICOAGULATION CONSULT NOTE - Initial Consult  Pharmacy Consult for heparin and warfarin Indication: atrial fibrillation  No Known Allergies  Patient Measurements: Height: 6\' 3"  (190.5 cm) Weight: 169 lb 15.6 oz (77.1 kg) IBW/kg (Calculated) : 84.5   Vital Signs: Temp: 98.6 F (37 C) (11/25 0753) Temp src: Oral (11/25 0753) BP: 127/91 mmHg (11/25 0715) Pulse Rate: 52  (11/25 0750)  Labs:  Basename 03/12/12 0500 03/11/12 1700 03/11/12 0700 03/11/12 0210 03/10/12 1303 03/10/12 0533  HGB 10.9* -- -- 10.4* -- --  HCT 30.6* -- -- 28.1* -- 29.6*  PLT 59* -- -- 73* -- 71*  APTT -- -- -- -- -- --  LABPROT -- -- -- -- -- --  INR -- -- -- -- -- --  HEPARINUNFRC -- -- -- -- -- --  CREATININE 1.01 1.14 1.47* -- -- --  CKTOTAL 668* 1040* -- -- -- --  CKMB 4.1* 6.3* -- -- -- --  TROPONINI <0.30 0.41* -- -- 1.00* --    Estimated Creatinine Clearance: 83.8 ml/min (by C-G formula based on Cr of 1.01).   Medical History: Past Medical History  Diagnosis Date  . Diabetes mellitus   . Glaucoma(365)   . Allergic rhinitis   . Prostate nodule   . Colonic mass   . Cancer   . Substance abuse   . Cancer of ascending colon, 7cm 05/25/2011  . Hypertension   . Pneumonia 1979 or 1980  . Anemia     Medications:  Prescriptions prior to admission  Medication Sig Dispense Refill  . ACCU-CHEK AVIVA PLUS test strip Daily.      Marland Kitchen acetaminophen (TYLENOL) 500 MG tablet Take 1,000 mg by mouth every 6 (six) hours as needed. Pain       . Ascorbic Acid (VITAMIN C PO) Take 1 tablet by mouth every morning.       . Aspirin (ECOTRIN PO) Take 81 mg by mouth daily before breakfast. STOP ASA TODAY      . brimonidine-timolol (COMBIGAN) 0.2-0.5 % ophthalmic solution Place 1 drop into both eyes every 12 (twelve) hours.       . ferrous sulfate 325 (65 FE) MG tablet Take 1 tablet (325 mg total) by mouth 2 (two) times daily.  30 tablet  11  . HYDROcodone-acetaminophen (VICODIN) 5-500 MG per tablet Take 1  tablet by mouth every 6 (six) hours as needed. For pain      . insulin aspart (NOVOLOG) 100 UNIT/ML injection Inject 1-15 Units into the skin 3 (three) times daily before meals. Take 1 unit for ever 6 grams of carbs before each meal      . insulin glargine (LANTUS) 100 UNIT/ML injection Inject 22 Units into the skin at bedtime.      Marland Kitchen latanoprost (XALATAN) 0.005 % ophthalmic solution Place 1 drop into both eyes at bedtime.      Marland Kitchen levocetirizine (XYZAL) 5 MG tablet Take 5 mg by mouth daily before breakfast.       . LISINOPRIL PO Take 5 mg by mouth daily before breakfast.       . Multiple Vitamin (MULTIVITAMIN) tablet Take 1 tablet by mouth every morning.       Marland Kitchen SIMVASTATIN PO Take 10 mg by mouth daily before breakfast.       . temazepam (RESTORIL) 15 MG capsule Take 15 mg by mouth at bedtime as needed. For sleep      . triamcinolone (NASACORT) 55 MCG/ACT nasal inhaler Place 1 spray into the nose every  morning. Allergies       . VITAMIN D, CHOLECALCIFEROL, PO Take 2,000 Units by mouth every morning. Vitamin D 3      . temazepam (RESTORIL) 15 MG capsule Take 1 capsule (15 mg total) by mouth at bedtime as needed for sleep.  30 capsule  0   Scheduled:    . [COMPLETED] aspirin      . aspirin EC  81 mg Oral Daily  . atorvastatin  20 mg Oral q1800  . brimonidine  1 drop Both Eyes Q12H   And  . timolol  1 drop Both Eyes BID  . coumadin book   Does not apply Once  . [COMPLETED] diltiazem  10 mg Intravenous Once  . heparin  2,000 Units Intravenous Once  . insulin aspart  0-15 Units Subcutaneous TID WC  . insulin glargine  15 Units Subcutaneous Daily  . latanoprost  1 drop Both Eyes QHS  . loratadine  10 mg Oral QAC breakfast  . metoprolol      . [COMPLETED] metoprolol  5 mg Intravenous Once  . metoprolol tartrate  25 mg Oral Q6H  . [COMPLETED] potassium chloride  10 mEq Intravenous Q1 Hr x 2  . [COMPLETED] potassium chloride  40 mEq Oral Q4H  . warfarin  10 mg Oral ONCE-1800  . warfarin    Does not apply Once  . Warfarin - Pharmacist Dosing Inpatient   Does not apply q1800  . [DISCONTINUED] heparin  5,000 Units Subcutaneous Q8H  . [DISCONTINUED] insulin glargine  10 Units Subcutaneous Daily  . [DISCONTINUED] insulin glargine  15 Units Subcutaneous Daily  . [DISCONTINUED] metoprolol tartrate  25 mg Oral BID   Infusions:    . diltiazem (CARDIZEM) infusion 20 mg/hr (03/12/12 0744)  . heparin    . [DISCONTINUED] sodium chloride 75 mL/hr at 03/11/12 0924  . [DISCONTINUED] dextrose 100 mL/hr at 03/11/12 0604  . [DISCONTINUED] insulin (NOVOLIN-R) infusion 6 mL/hr at 03/11/12 0700    Assessment: 61yo male developed Afib with RVR last pm with HR to 140s, now on dilt, to add metoprolol, to begin anticoagulation.  Goal of Therapy:  INR 2-3 Heparin level 0.3-0.7 units/ml Monitor platelets by anticoagulation protocol: Yes   Plan:  Rec'd SQ heparin at 0600 this am; will bolus with heparin 2000 units x1 followed by gtt at 1200 units/hr and monitor heparin levels and CBC; will give Coumadin 10mg  po x1 this evening and monitor INR; begin Coumadin education.  Colleen Can PharmD BCPS 03/12/2012,8:01 AM

## 2012-03-12 NOTE — Progress Notes (Signed)
Inpatient Diabetes Program Recommendations  AACE/ADA: New Consensus Statement on Inpatient Glycemic Control (2013)  Target Ranges:  Prepandial:   less than 140 mg/dL      Peak postprandial:   less than 180 mg/dL (1-2 hours)      Critically ill patients:  140 - 180 mg/dL   Recommend 1st dose Lantus now instead of HS.  CBGs 400, 343.   Will follow. Thank you  Piedad Climes RN,BSN,CDE Inpatient Diabetes Coordinator [phone] (team pager)

## 2012-03-12 NOTE — Consult Note (Signed)
Reason for Consult:  L shoulder injury Referring Physician: Dr. Merita Norton is an 61 y.o. male.  HPI: 61 y/o RHD male with PMH of diabetes fell at home last Wednesday evening.  He tried unsuccessfully to catch himself and fell on the left shoulder.  He was seen at [facility] where xrays showed a distal clavicle fracture.  He was to f/u with [facility] the following day but became severely nauseated from oral pain meds.  He ultimately was admitted for DKA and has now developed cardiac arrythmias.  He c/o moderate aching pain deep in the posterior shoulder.  He denies numbness, tingling or weakness in the L UE.  He has never had any L shuolder injury or surgery in the past.  The pain is better when wearing the sling and worse with any motion.  Past Medical History  Diagnosis Date  . Diabetes mellitus   . Glaucoma(365)   . Allergic rhinitis   . Prostate nodule   . Colonic mass   . Cancer   . Substance abuse   . Cancer of ascending colon, 7cm 05/25/2011  . Hypertension   . Pneumonia 1979 or 1980  . Anemia     Past Surgical History  Procedure Date  . Finger surgery 2009    right middle  . Nasal fracture surgery 1968  . Tonsillectomy 1957 - approximate  . 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 Portacath placement 07/01/2011    Procedure: INSERTION PORT-A-CATH;  Surgeon: Ardeth Sportsman, MD;  Location: [facility];  Service: General;  Laterality: Left;  Insertion of Port-A-Catheter Left Internal Jugular    Family History  Problem Relation Age of Onset  . Colon polyps Father   . Lung cancer Father   . Diabetes Father   . Breast cancer Mother   . Pancreatitis Mother     intestinal adhesions    Social History:  reports that he quit smoking about 18 years ago. His smoking use included Cigarettes. He has a 15 pack-year smoking history. He has never used smokeless tobacco. He reports that he drinks alcohol. He reports  that he uses illicit drugs (Marijuana).  Allergies: No Known Allergies  Medications: I have reviewed the patient's current medications.  Results for orders placed during the hospital encounter of 03/09/12 (from the past 48 hour(s))  GLUCOSE, CAPILLARY     Status: Abnormal   Collection Time   03/10/12  7:38 PM      Component Value Range Comment   Glucose-Capillary 223 (*) 70 - 99 mg/dL    Comment 1 Notify RN     BASIC METABOLIC PANEL     Status: Abnormal   Collection Time   03/10/12  7:40 PM      Component Value Range Comment   Sodium 125 (*) 135 - 145 mEq/L    Potassium 3.1 (*) 3.5 - 5.1 mEq/L DELTA CHECK NOTED   Chloride 95 (*) 96 - 112 mEq/L    CO2 17 (*) 19 - 32 mEq/L    Glucose, Bld 660 (*) 70 - 99 mg/dL    BUN 41 (*) 6 - 23 mg/dL    Creatinine, Ser 4.69 (*) 0.50 - 1.35 mg/dL    Calcium 6.7 (*) 8.4 - 10.5 mg/dL    GFR calc non Af Amer 42 (*) >90 mL/min    GFR calc Af Amer 49 (*) >90 mL/min   GLUCOSE, CAPILLARY     Status: Abnormal  Collection Time   03/10/12  8:47 PM      Component Value Range Comment   Glucose-Capillary 172 (*) 70 - 99 mg/dL   BASIC METABOLIC PANEL     Status: Abnormal   Collection Time   03/10/12  9:45 PM      Component Value Range Comment   Sodium 130 (*) 135 - 145 mEq/L    Potassium 5.6 (*) 3.5 - 5.1 mEq/L    Chloride 99  96 - 112 mEq/L    CO2 17 (*) 19 - 32 mEq/L    Glucose, Bld 191 (*) 70 - 99 mg/dL    BUN 45 (*) 6 - 23 mg/dL    Creatinine, Ser 1.61 (*) 0.50 - 1.35 mg/dL    Calcium 7.8 (*) 8.4 - 10.5 mg/dL    GFR calc non Af Amer 39 (*) >90 mL/min    GFR calc Af Amer 46 (*) >90 mL/min   GLUCOSE, CAPILLARY     Status: Abnormal   Collection Time   03/10/12  9:57 PM      Component Value Range Comment   Glucose-Capillary 181 (*) 70 - 99 mg/dL    Comment 1 Notify RN     GLUCOSE, CAPILLARY     Status: Abnormal   Collection Time   03/10/12 10:16 PM      Component Value Range Comment   Glucose-Capillary 172 (*) 70 - 99 mg/dL   GLUCOSE,  CAPILLARY     Status: Abnormal   Collection Time   03/10/12 11:27 PM      Component Value Range Comment   Glucose-Capillary 166 (*) 70 - 99 mg/dL   BASIC METABOLIC PANEL     Status: Abnormal   Collection Time   03/10/12 11:30 PM      Component Value Range Comment   Sodium 132 (*) 135 - 145 mEq/L    Potassium 6.9 (*) 3.5 - 5.1 mEq/L    Chloride 103  96 - 112 mEq/L    CO2 16 (*) 19 - 32 mEq/L    Glucose, Bld 174 (*) 70 - 99 mg/dL    BUN 44 (*) 6 - 23 mg/dL    Creatinine, Ser 0.96 (*) 0.50 - 1.35 mg/dL    Calcium 7.7 (*) 8.4 - 10.5 mg/dL    GFR calc non Af Amer 39 (*) >90 mL/min    GFR calc Af Amer 45 (*) >90 mL/min   GLUCOSE, CAPILLARY     Status: Abnormal   Collection Time   03/11/12 12:36 AM      Component Value Range Comment   Glucose-Capillary 170 (*) 70 - 99 mg/dL    Comment 1 Notify RN     GLUCOSE, CAPILLARY     Status: Abnormal   Collection Time   03/11/12 12:45 AM      Component Value Range Comment   Glucose-Capillary 164 (*) 70 - 99 mg/dL   GLUCOSE, CAPILLARY     Status: Abnormal   Collection Time   03/11/12  1:46 AM      Component Value Range Comment   Glucose-Capillary 126 (*) 70 - 99 mg/dL   BASIC METABOLIC PANEL     Status: Abnormal   Collection Time   03/11/12  2:10 AM      Component Value Range Comment   Sodium 136  135 - 145 mEq/L    Potassium 3.2 (*) 3.5 - 5.1 mEq/L DELTA CHECK NOTED   Chloride 100  96 - 112 mEq/L  CO2 24  19 - 32 mEq/L    Glucose, Bld 146 (*) 70 - 99 mg/dL    BUN 39 (*) 6 - 23 mg/dL    Creatinine, Ser 6.04 (*) 0.50 - 1.35 mg/dL    Calcium 8.0 (*) 8.4 - 10.5 mg/dL    GFR calc non Af Amer 43 (*) >90 mL/min    GFR calc Af Amer 49 (*) >90 mL/min   LIPASE, BLOOD     Status: Abnormal   Collection Time   03/11/12  2:10 AM      Component Value Range Comment   Lipase 87 (*) 11 - 59 U/L   CBC     Status: Abnormal   Collection Time   03/11/12  2:10 AM      Component Value Range Comment   WBC 3.5 (*) 4.0 - 10.5 K/uL    RBC 3.20 (*)  4.22 - 5.81 MIL/uL    Hemoglobin 10.4 (*) 13.0 - 17.0 g/dL    HCT 54.0 (*) 98.1 - 52.0 %    MCV 87.8  78.0 - 100.0 fL    MCH 32.5  26.0 - 34.0 pg    MCHC 37.0 (*) 30.0 - 36.0 g/dL    RDW 19.1 (*) 47.8 - 15.5 %    Platelets 73 (*) 150 - 400 K/uL CONSISTENT WITH PREVIOUS RESULT  GLUCOSE, CAPILLARY     Status: Abnormal   Collection Time   03/11/12  2:45 AM      Component Value Range Comment   Glucose-Capillary 175 (*) 70 - 99 mg/dL   GLUCOSE, CAPILLARY     Status: Abnormal   Collection Time   03/11/12  3:43 AM      Component Value Range Comment   Glucose-Capillary 221 (*) 70 - 99 mg/dL   GLUCOSE, CAPILLARY     Status: Abnormal   Collection Time   03/11/12  4:35 AM      Component Value Range Comment   Glucose-Capillary 256 (*) 70 - 99 mg/dL   BASIC METABOLIC PANEL     Status: Abnormal   Collection Time   03/11/12  4:55 AM      Component Value Range Comment   Sodium 135  135 - 145 mEq/L    Potassium 2.9 (*) 3.5 - 5.1 mEq/L    Chloride 100  96 - 112 mEq/L    CO2 24  19 - 32 mEq/L    Glucose, Bld 256 (*) 70 - 99 mg/dL    BUN 37 (*) 6 - 23 mg/dL    Creatinine, Ser 2.95 (*) 0.50 - 1.35 mg/dL    Calcium 8.0 (*) 8.4 - 10.5 mg/dL    GFR calc non Af Amer 46 (*) >90 mL/min    GFR calc Af Amer 53 (*) >90 mL/min   GLUCOSE, CAPILLARY     Status: Abnormal   Collection Time   03/11/12  5:55 AM      Component Value Range Comment   Glucose-Capillary 220 (*) 70 - 99 mg/dL   BASIC METABOLIC PANEL     Status: Abnormal   Collection Time   03/11/12  7:00 AM      Component Value Range Comment   Sodium 136  135 - 145 mEq/L    Potassium 3.7  3.5 - 5.1 mEq/L    Chloride 100  96 - 112 mEq/L    CO2 26  19 - 32 mEq/L    Glucose, Bld 167 (*) 70 - 99 mg/dL  BUN 32 (*) 6 - 23 mg/dL    Creatinine, Ser 1.61 (*) 0.50 - 1.35 mg/dL    Calcium 8.0 (*) 8.4 - 10.5 mg/dL    GFR calc non Af Amer 50 (*) >90 mL/min    GFR calc Af Amer 58 (*) >90 mL/min   GLUCOSE, CAPILLARY     Status: Abnormal    Collection Time   03/11/12  7:02 AM      Component Value Range Comment   Glucose-Capillary 160 (*) 70 - 99 mg/dL   GLUCOSE, CAPILLARY     Status: Normal   Collection Time   03/11/12  8:33 AM      Component Value Range Comment   Glucose-Capillary 74  70 - 99 mg/dL   GLUCOSE, CAPILLARY     Status: Abnormal   Collection Time   03/11/12  9:20 AM      Component Value Range Comment   Glucose-Capillary 206 (*) 70 - 99 mg/dL   GLUCOSE, CAPILLARY     Status: Abnormal   Collection Time   03/11/12 10:32 AM      Component Value Range Comment   Glucose-Capillary 158 (*) 70 - 99 mg/dL   GLUCOSE, CAPILLARY     Status: Abnormal   Collection Time   03/11/12 12:47 PM      Component Value Range Comment   Glucose-Capillary 165 (*) 70 - 99 mg/dL   BASIC METABOLIC PANEL     Status: Abnormal   Collection Time   03/11/12  5:00 PM      Component Value Range Comment   Sodium 136  135 - 145 mEq/L    Potassium 2.8 (*) 3.5 - 5.1 mEq/L DELTA CHECK NOTED   Chloride 98  96 - 112 mEq/L    CO2 27  19 - 32 mEq/L    Glucose, Bld 285 (*) 70 - 99 mg/dL    BUN 25 (*) 6 - 23 mg/dL    Creatinine, Ser 0.96  0.50 - 1.35 mg/dL    Calcium 8.1 (*) 8.4 - 10.5 mg/dL    GFR calc non Af Amer 68 (*) >90 mL/min    GFR calc Af Amer 78 (*) >90 mL/min   TROPONIN I     Status: Abnormal   Collection Time   03/11/12  5:00 PM      Component Value Range Comment   Troponin I 0.41 (*) <0.30 ng/mL   CK TOTAL AND CKMB     Status: Abnormal   Collection Time   03/11/12  5:00 PM      Component Value Range Comment   Total CK 1040 (*) 7 - 232 U/L    CK, MB 6.3 (*) 0.3 - 4.0 ng/mL    Relative Index 0.6  0.0 - 2.5   TSH     Status: Normal   Collection Time   03/11/12  5:00 PM      Component Value Range Comment   TSH 0.968  0.350 - 4.500 uIU/mL   GLUCOSE, CAPILLARY     Status: Abnormal   Collection Time   03/11/12  5:27 PM      Component Value Range Comment   Glucose-Capillary 285 (*) 70 - 99 mg/dL    Comment 1 Notify RN       GLUCOSE, CAPILLARY     Status: Abnormal   Collection Time   03/11/12  9:58 PM      Component Value Range Comment   Glucose-Capillary 235 (*) 70 - 99 mg/dL  BASIC METABOLIC PANEL     Status: Abnormal   Collection Time   03/12/12  5:00 AM      Component Value Range Comment   Sodium 138  135 - 145 mEq/L    Potassium 4.0  3.5 - 5.1 mEq/L    Chloride 97  96 - 112 mEq/L    CO2 27  19 - 32 mEq/L    Glucose, Bld 432 (*) 70 - 99 mg/dL    BUN 20  6 - 23 mg/dL    Creatinine, Ser 5.40  0.50 - 1.35 mg/dL    Calcium 8.7  8.4 - 98.1 mg/dL    GFR calc non Af Amer 78 (*) >90 mL/min    GFR calc Af Amer >90  >90 mL/min   MAGNESIUM     Status: Normal   Collection Time   03/12/12  5:00 AM      Component Value Range Comment   Magnesium 2.2  1.5 - 2.5 mg/dL   PHOSPHORUS     Status: Abnormal   Collection Time   03/12/12  5:00 AM      Component Value Range Comment   Phosphorus 1.8 (*) 2.3 - 4.6 mg/dL   CBC     Status: Abnormal   Collection Time   03/12/12  5:00 AM      Component Value Range Comment   WBC 3.9 (*) 4.0 - 10.5 K/uL    RBC 3.31 (*) 4.22 - 5.81 MIL/uL    Hemoglobin 10.9 (*) 13.0 - 17.0 g/dL    HCT 19.1 (*) 47.8 - 52.0 %    MCV 92.4  78.0 - 100.0 fL    MCH 32.9  26.0 - 34.0 pg    MCHC 35.6  30.0 - 36.0 g/dL    RDW 29.5  62.1 - 30.8 %    Platelets 59 (*) 150 - 400 K/uL CONSISTENT WITH PREVIOUS RESULT  TROPONIN I     Status: Normal   Collection Time   03/12/12  5:00 AM      Component Value Range Comment   Troponin I <0.30  <0.30 ng/mL   CK TOTAL AND CKMB     Status: Abnormal   Collection Time   03/12/12  5:00 AM      Component Value Range Comment   Total CK 668 (*) 7 - 232 U/L    CK, MB 4.1 (*) 0.3 - 4.0 ng/mL    Relative Index 0.6  0.0 - 2.5   GLUCOSE, CAPILLARY     Status: Abnormal   Collection Time   03/12/12  7:58 AM      Component Value Range Comment   Glucose-Capillary 400 (*) 70 - 99 mg/dL   PROTIME-INR     Status: Normal   Collection Time   03/12/12  8:30 AM       Component Value Range Comment   Prothrombin Time 12.3  11.6 - 15.2 seconds    INR 0.92  0.00 - 1.49   GLUCOSE, CAPILLARY     Status: Abnormal   Collection Time   03/12/12  1:53 PM      Component Value Range Comment   Glucose-Capillary 343 (*) 70 - 99 mg/dL   GLUCOSE, CAPILLARY     Status: Abnormal   Collection Time   03/12/12  5:16 PM      Component Value Range Comment   Glucose-Capillary 189 (*) 70 - 99 mg/dL     Dg Clavicle Left  65/78/4696  *RADIOLOGY REPORT*  Clinical Data: Pain and swelling and bruising at the left shoulder. Known distal left clavicle fracture.  LEFT CLAVICLE - 2+ VIEWS  Comparison: Radiographs dated 03/08/2012  Findings: Now apparent is a fracture of the scapula.  The fracture may extend through the glenoid.  I recommend CT scan for further evaluation of the scapula.  There is improved alignment of the distal clavicle fracture.  Power port is noted. There are fractures of the third and fourth ribs.  IMPRESSION: Improved alignment and position of the distal clavicle fracture. Now identified are fractures of the posterior aspects of the left third and fourth ribs as well as of the scapula.  CT scan may be useful for further delineation of the scapular fracture to see if the glenoid is involved.   Original Report Authenticated By: Francene Boyers, M.D.    Dg Shoulder Left  03/12/2012  *RADIOLOGY REPORT*  Clinical Data: Pain, swelling, and bruising is.  Clavicle fracture.  LEFT SHOULDER - 2+ VIEW  Comparison: 03/08/2012  Findings: Now apparent is a fracture of the scapula which appears to extend through the glenoid.  There also are fractures of the posterior aspects of the left third and fourth ribs.  Alignment and position of the clavicle fracture has improved.  IMPRESSION: Fractures of the scapula, distal clavicle, and left third and fourth ribs.  Improved alignment and position of the clavicle fracture.   Original Report Authenticated By: Francene Boyers, M.D.     ROS:  No  recent f/c.  + n/v.  O/w neg. PE:  Blood pressure 115/63, pulse 75, temperature 98.1 F (36.7 C), temperature source Oral, resp. rate 17, height 6\' 3"  (1.905 m), weight 77.1 kg (169 lb 15.6 oz), SpO2 98.00%. wn wd male in nad.  A and O x 4.  Mood anda ffect normal.  EOMI.  Respiratiosn unlabored.  L shoulder with swelling and ecchymosis but no gross deformity.  TTP at distal clavicle superiorly.  TTP posteriorly at scapula as well.  2+ radial and ulnar pulses.  Feels LT normally in radial, median and ulnar n. Dist.  5/5 strength at biceps and triceps.  No lymphadenopathy of L UE.  Skin healthy and intact.  Assessment/Plan: L distal clavicle and scapula fractures - at this point we'll keep him immobilized in a sling.  I'll order a fine cut CT of the shoulder to eval the scap fx where it appears to involve the glenoid.  I believe this can be treated in closed fashion, but the CT is necessary to make this determination.  Toni Arthurs 03/12/2012, 6:47 PM

## 2012-03-12 NOTE — Progress Notes (Addendum)
Patient ID: Greg Robertson, male   DOB: January 06, 1951, 61 y.o.   MRN: [id number]    SUBJECTIVE: Patient developed atrial fibrillation with RVR last night, HR in the 120s-140s this morning on diltiazem gtt.  No complaints, no chest pain or dyspnea.      . [COMPLETED] aspirin      . aspirin EC  81 mg Oral Daily  . atorvastatin  20 mg Oral q1800  . brimonidine  1 drop Both Eyes Q12H   And  . timolol  1 drop Both Eyes BID  . [COMPLETED] diltiazem  10 mg Intravenous Once  . heparin  5,000 Units Subcutaneous Q8H  . insulin aspart  0-15 Units Subcutaneous TID WC  . insulin glargine  10 Units Subcutaneous Daily  . latanoprost  1 drop Both Eyes QHS  . loratadine  10 mg Oral QAC breakfast  . metoprolol  5 mg Intravenous Once  . metoprolol tartrate  25 mg Oral Q6H  . [COMPLETED] potassium chloride  10 mEq Intravenous Q1 Hr x 2  . [COMPLETED] potassium chloride  40 mEq Oral Q4H  . [DISCONTINUED] insulin glargine  15 Units Subcutaneous Daily  . [DISCONTINUED] metoprolol tartrate  25 mg Oral BID      Filed Vitals:   03/12/12 0700 03/12/12 0708 03/12/12 0710 03/12/12 0715  BP: 119/84   127/91  Pulse: 144 124 120 130  Temp:      TempSrc:      Resp: 20 16 18 17   Height:      Weight:      SpO2: 98% 96% 96% 95%    Intake/Output Summary (Last 24 hours) at 03/12/12 0748 Last data filed at 03/12/12 0700  Gross per 24 hour  Intake 1169.82 ml  Output   2650 ml  Net -1480.18 ml    LABS: Basic Metabolic Panel:  Basename 03/12/12 0500 03/11/12 1700 03/10/12 0533  NA 138 136 --  K 4.0 2.8* --  CL 97 98 --  CO2 27 27 --  GLUCOSE 432* 285* --  BUN 20 25* --  CREATININE 1.01 1.14 --  CALCIUM 8.7 8.1* --  MG 2.2 -- 2.1  PHOS 1.8* -- 3.8   Liver Function Tests: No results found for this basename: AST:2,ALT:2,ALKPHOS:2,BILITOT:2,PROT:2,ALBUMIN:2 in the last 72 hours  Basename 03/11/12 0210 03/10/12 0533  LIPASE 87* 156*  AMYLASE -- --   CBC:  Basename 03/12/12 0500 03/11/12  0210 03/09/12 2000  WBC 3.9* 3.5* --  NEUTROABS -- -- 9.9*  HGB 10.9* 10.4* --  HCT 30.6* 28.1* --  MCV 92.4 87.8 --  PLT 59* 73* --   Cardiac Enzymes:  Basename 03/12/12 0500 03/11/12 1700 03/10/12 1303  CKTOTAL 668* 1040* --  CKMB 4.1* 6.3* --  CKMBINDEX -- -- --  TROPONINI <0.30 0.41* 1.00*   BNP: No components found with this basename: POCBNP:3 D-Dimer: No results found for this basename: DDIMER:2 in the last 72 hours Hemoglobin A1C: No results found for this basename: HGBA1C in the last 72 hours Fasting Lipid Panel: No results found for this basename: CHOL,HDL,LDLCALC,TRIG,CHOLHDL,LDLDIRECT in the last 72 hours Thyroid Function Tests:  Basename 03/11/12 1700  TSH 0.968  T4TOTAL --  T3FREE --  THYROIDAB --   Anemia Panel: No results found for this basename: VITAMINB12,FOLATE,FERRITIN,TIBC,IRON,RETICCTPCT in the last 72 hours  RADIOLOGY: Dg Chest Port 1 View  03/09/2012  *RADIOLOGY REPORT*  Clinical Data: Shortness of breath, diabetes  PORTABLE CHEST - 1 VIEW  Comparison: 07/12/2011  Findings: Left IJ port  catheter extends to the left innominate vein/SVC junction.  Lungs clear.  No definite effusion although the right lateral costophrenic angle is excluded.  Heart size normal. Regional bones unremarkable.  IMPRESSION:  No acute disease   Original Report Authenticated By: D. Andria Rhein, MD     PHYSICAL EXAM General: NAD Neck: No JVD, no thyromegaly or thyroid nodule.  Lungs: Clear to auscultation bilaterally with normal respiratory effort. CV: Nondisplaced PMI.  Heart tachy, irregular S1/S2, no S3/S4, no murmur.  No peripheral edema.  No carotid bruit.  Normal pedal pulses.  Abdomen: Soft, nontender, no hepatosplenomegaly, no distention.  Neurologic: Alert and oriented x 3.  Psych: Normal affect. Extremities: No clubbing or cyanosis.   TELEMETRY: Reviewed telemetry pt in atrial fibrillation with RVR to 120s-140s  ASSESSMENT AND PLAN:  61 yo admitted with DKA  complicated by atrial fibrillation with RVR.  1. Atrial fibrillation with RVR: Recurrent.  Will manage with diltiazem gtt for now.  Will add metoprolol additionally to help slow rate.  Will start heparin gtt/coumadin and plan TEE-guided DCCV in am if he does not convert on his own.  After conversion to NSR, will likely put him on dronedarone for at least a period of time to maintain NSR.  Would avoid Ic agents as he had elevated troponin with DKA episode. Anticoagulation is a bit of a risk with him given fall with clavicular fracture but normally he is stable walking (tripped on a rug causing fracture).  2. Elevated troponin: Likely demand ischemia with severe DKA and hypotension.  However, has DM and HTN so does have significant CAD risk.  EF normal on echo.  No chest pain even with very rapid atrial fibrillation.  Will likely do outpatient myoview.    [name] 03/12/2012 7:54 AM  Patient back in NSR this afternoon.  Will hold on heparin/coumadin and start apixaban 5 mg bid.  I will also start him on dronedarone 400 mg bid to try to hold NSR.   [name] 03/12/2012 1:23 PM

## 2012-03-12 NOTE — Progress Notes (Signed)
Orders rec'd. carried out. Being moved to 2925. Remains a/ox3 asymptomatic. 99.1,95/73,136,18 Report called.

## 2012-03-12 NOTE — Progress Notes (Signed)
[facility] Progress Note [place] TEAM 1 - Stepdown/ICU TEAM   Greg Robertson ZYS:[id number] DOB: 08/09/1950 DOA: 03/09/2012 PCP: Rudi Heap, MD  Brief narrative: 61 year old male patient with known colon cancer status post colectomy and currently receiving chemotherapy. He also has a recent clavicle fracture apparent onset after fall. He presented to the emergency department in frank DKA with a glucose of 1277 and a pH of 7.007. At that time he had altered mentation and was subsequently admitted to the ICU for further monitoring and treatment. Since admission he had developed elevated troponin levels likely related to demand ischemia. He's had intermittent issues with atrial fibrillation RVR. He was initially transferred to the floor but redeveloped atrial fibrillation with RVR overnight on 03/12/2012 and was subsequently transferred back to the step down unit because of need to initiate IV Cardizem. Team 1 has assumed care of this patient on 03/12/2012.  Assessment/Plan:  Diabetic ketoacidosis/Diabetes mellitus type 2, uncontrolled, without complications *CBGs currently are not well controlled but it is noted the patient has started a diet and is not on his preadmission dose of insulin therefore resume insulin and since presented with DKA have actually increased dose. *At home patient states based on the amount of carbs he takes in he adjust his sliding scale insulin. While here we'll use routine sliding scale insulin and adjust based on control *Check a hemoglobin A1c  Dehydration, severe *Has been rehydrated since admission and this possibly was likely precipitating factor for patient's fall at home i.e. suspect orthostasis. 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  Truett Perna *He completed a final cycle of FOLFOX chemotherapy on 01/05/2012. *He has also been diagnosed with Hereditary non-polyposis cancer syndrome. *At last visit in October plans were to proceed with one-year surveillance CT scan in one year surveillance colonoscopy and to have the Port-A-Cath removed by surgery  NSTEMI (non-ST elevated myocardial infarction)/ Demand ischemia *Peak troponin 1.57 and has subsequently trended down to normal level *Cardiology plans outpatient Myoview study  Atrial fibrillation with RVR *Earlier this morning had difficulty with rate control and was requiring Cardizem infusion at 20 mg per hour. Cardiology had initially planned initiation of heparin and Coumadin and TEE cardioversion on 03/13/2012. Since initial evaluation the patient has subsequently converted to normal sinus rhythm. Apixaban 5 mg bid was started as well as dronedarone 400 mg bid to try to hold NSR.   Fracture of left distal clavicle *Apparently had occurred prior to this admission and had sought medical attention with x-ray confirmation of fracture. Patient reports an outpatient appointment scheduled with [facility] orthopedics *Has had chest x-ray this admission which shows incidental finding of the distal left clavicle fracture. *I have discussed with Dr. Victorino Dike of orthopedics and he recommends obtaining left shoulder and left clavicle views and he will see the patient later today  **Addendum: Fractures of the scapula, distal clavicle, and left third and fourth ribs. Improved alignment and position of the clavicle   Right bundle branch block  Chronic diastolic heart failure, NYHA class 1 *Currently compensated   DVT prophylaxis: Starting apixaban Code Status: Full Family Communication: Spoke with patient  Disposition Plan: Remain in step down to ensure adequate rate control  Consultants: Cardiology Orthopedics  Procedures: None  Antibiotics: None  HPI/Subjective: Patient  alert and endorsing he does not feel he is  getting enough insulin. Currently denies chest pain, shortness of breath or any other symptoms.   Objective: Blood pressure 129/72, pulse 79, temperature 98.8 F (37.1 C), temperature source Oral, resp. rate 19, height 6\' 3"  (1.905 m), weight 77.1 kg (169 lb 15.6 oz), SpO2 97.00%.  Intake/Output Summary (Last 24 hours) at 03/12/12 1352 Last data filed at 03/12/12 1316  Gross per 24 hour  Intake 660.75 ml  Output   3100 ml  Net -2439.25 ml     Exam: General: No acute respiratory distress Lungs: Clear to auscultation bilaterally without wheezes or crackles, room air Cardiovascular: Irregular rate and rhythm without murmur gallop or rub normal S1 and S2-atrial fibrillation earlier this morning during our examination but subsequently has converted to sinus rhythm Abdomen: Nontender, nondistended, soft, bowel sounds positive, no rebound, no ascites, no appreciable mass Musculoskeletal: No significant cyanosis, clubbing of bilateral lower extremities, left arm is in a sling and patient is tender to palpation over anterior shoulder and there is some swelling noted Neurological: Patient is alert and oriented x3, moves all extremities x4, exam non-focal  Data Reviewed: Basic Metabolic Panel:  Lab 03/12/12 1610 03/11/12 1700 03/11/12 0700 03/11/12 0455 03/11/12 0210 03/10/12 0533  NA 138 136 136 135 136 --  K 4.0 2.8* 3.7 2.9* 3.2* --  CL 97 98 100 100 100 --  CO2 27 27 26 24 24  --  GLUCOSE 432* 285* 167* 256* 146* --  BUN 20 25* 32* 37* 39* --  CREATININE 1.01 1.14 1.47* 1.58* 1.67* --  CALCIUM 8.7 8.1* 8.0* 8.0* 8.0* --  MG 2.2 -- -- -- -- 2.1  PHOS 1.8* -- -- -- -- 3.8    Lab 03/11/12 0210 03/10/12 0533  LIPASE 87* 156*  AMYLASE -- --   CBC:  Lab 03/12/12 0500 03/11/12 0210 03/10/12 0533 03/10/12 0050 03/09/12 2000  WBC 3.9* 3.5* 4.4 6.4 12.1*  NEUTROABS -- -- -- -- 9.9*  HGB 10.9* 10.4* 11.0* 12.0* 11.7*  HCT 30.6* 28.1* 29.6*  35.4* 36.3*  MCV 92.4 87.8 90.0 97.5 106.8*  PLT 59* 73* 71* 83* 119*   Cardiac Enzymes:  Lab 03/12/12 0500 03/11/12 1700 03/10/12 1303 03/10/12 0533  CKTOTAL 668* 1040* -- --  CKMB 4.1* 6.3* -- --  CKMBINDEX -- -- -- --  TROPONINI <0.30 0.41* 1.00* 1.57*   CBG:  Lab 03/12/12 0758 03/11/12 2158 03/11/12 1727 03/11/12 1247 03/11/12 1032  GLUCAP 400* 235* 285* 165* 158*    Recent Results (from the past 240 hour(s))  URINE CULTURE     Status: Normal   Collection Time   03/09/12 10:19 PM      Component Value Range Status Comment   Specimen Description URINE, CATHETERIZED   Final    Special Requests NONE   Final    Culture  Setup Time 03/09/2012 22:52   Final    Colony Count NO GROWTH   Final    Culture NO GROWTH   Final    Report Status 03/11/2012 FINAL   Final   URINE CULTURE     Status: Normal   Collection Time   03/10/12  1:52 AM      Component Value Range Status Comment   Specimen Description URINE, CATHETERIZED   Final    Special Requests Normal   Final    Culture  Setup Time 03/10/2012 14:48   Final    Colony Count NO GROWTH   Final    Culture NO GROWTH   Final  Report Status 03/11/2012 FINAL   Final   MRSA PCR SCREENING     Status: Normal   Collection Time   03/10/12  2:51 AM      Component Value Range Status Comment   MRSA by PCR NEGATIVE  NEGATIVE Final      Studies:  Recent x-ray studies have been reviewed in detail by the Attending Physician  Scheduled Meds:  Reviewed in detail by the Attending Physician   Junious Silk, ANP [facility] Office  [phone] Pager [phone]  On-Call/Text Page:      [email or web address]      password [email or web address]  If 7PM-7AM, please contact night-coverage [email or web address] Password [email or web address] 03/12/2012, 1:52 PM   LOS: 3 days   I have personally examined this patient and reviewed the entire database. I have reviewed the above note, made any necessary editorial changes, and agree with its content.  Lonia Blood,  MD [facility]

## 2012-03-12 NOTE — Care Management Note (Addendum)
    Page 1 of 1   03/14/2012     1:41:35 PM   CARE MANAGEMENT NOTE 03/14/2012  Patient:  Greg Robertson, Greg Robertson   Account Number:  [id number]  Date Initiated:  03/12/2012  Documentation initiated by:  Greg Robertson  Subjective/Objective Assessment:   at fib and pos troponins     Action/Plan:   lives alone, pcp dr Greg Robertson   Anticipated DC Date:  03/14/2012   Anticipated DC Plan:  HOME/SELF CARE      DC Planning Services  CM consult  Patient refused services      Choice offered to / List presented to:             Status of service:  Completed, signed off Medicare Important Message given?   (If response is "NO", the following Medicare IM given date fields will be blank) Date Medicare IM given:   Date Additional Medicare IM given:    Discharge Disposition:  HOME/SELF CARE  Per UR Regulation:  Reviewed for med. necessity/level of care/duration of stay  If discussed at Long Length of Stay Meetings, dates discussed:    Comments:  03-14-12 [address] Greg Robertson, [place] [phone] CM did speak to pt and the plan is for home without [facility] services. Pt refusing at this time. He states that his father has a personal care provider and that she will help him also.    11/25 8:55a Greg dowell rn,bsn [phone]

## 2012-03-12 NOTE — Progress Notes (Signed)
eLink Physician-Brief Progress Note Patient Name: Greg Robertson DOB: 20-Jul-1950 MRN: [id number]  Date of Service  03/12/2012   HPI/Events of Note  Repeat call from nurse stating that HR was controlled briefly with the one time dose of dilt 10 mg IV but now HR remains elevated 140s to 160s AF/RVR.   eICU Interventions  Plan: Txf to stepdown bed Dilt gtt for rate control   Intervention Category Intermediate Interventions: Arrhythmia - evaluation and management  DETERDING,ELIZABETH 03/12/2012, 3:35 AM

## 2012-03-13 ENCOUNTER — Inpatient Hospital Stay (HOSPITAL_COMMUNITY): Payer: BC Managed Care – PPO

## 2012-03-13 LAB — BASIC METABOLIC PANEL
CO2: 31 mEq/L (ref 19–32)
Chloride: 99 mEq/L (ref 96–112)
Glucose, Bld: 254 mg/dL — ABNORMAL HIGH (ref 70–99)
Potassium: 3.3 mEq/L — ABNORMAL LOW (ref 3.5–5.1)
Sodium: 136 mEq/L (ref 135–145)

## 2012-03-13 LAB — HEMOGLOBIN A1C
Hgb A1c MFr Bld: 10.4 % — ABNORMAL HIGH (ref <5.7)
Mean Plasma Glucose: 252 mg/dL — ABNORMAL HIGH (ref <117)

## 2012-03-13 LAB — CBC
HCT: 28.8 % — ABNORMAL LOW (ref 39.0–52.0)
MCV: 93.8 fL (ref 78.0–100.0)
Platelets: 58 10*3/uL — ABNORMAL LOW (ref 150–400)
RBC: 3.07 MIL/uL — ABNORMAL LOW (ref 4.22–5.81)
WBC: 5.4 10*3/uL (ref 4.0–10.5)

## 2012-03-13 LAB — GLUCOSE, CAPILLARY
Glucose-Capillary: 222 mg/dL — ABNORMAL HIGH (ref 70–99)
Glucose-Capillary: 161 mg/dL — ABNORMAL HIGH (ref 70–99)
Glucose-Capillary: 154 mg/dL — ABNORMAL HIGH (ref 70–99)

## 2012-03-13 IMAGING — CT CT SHOULDER*L* W/O CM
3 of 5 series · 16 of 33 positions shown, 19 images · non-contrast
Comparison: Plain films [DATE].

CLINICAL DATA: Status post fall with left clavicle and scapular
fractures.

CT OF THE LEFT SHOULDER WITHOUT CONTRAST
TECHNIQUE: Multidetector CT imaging was performed according to the
standard protocol. Multiplanar CT image reconstructions were also
generated.

[Series 3: shoulder 3.0 (id) · axial · 0.51mm/px · z∈[-194,-23]mm · 9 of 69 slices shown, 12 images]
[im 6/69  soft-tissue]
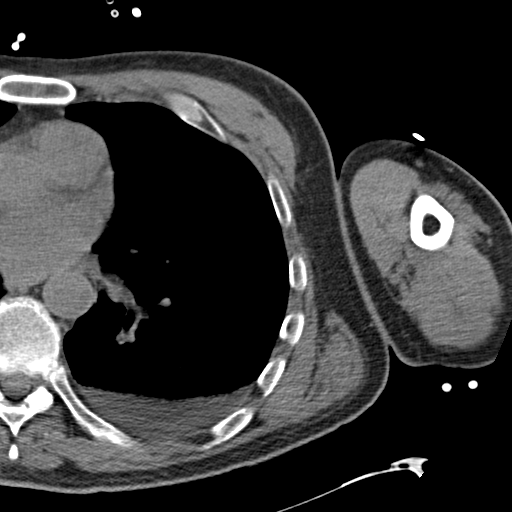
[im 6/69  bone]
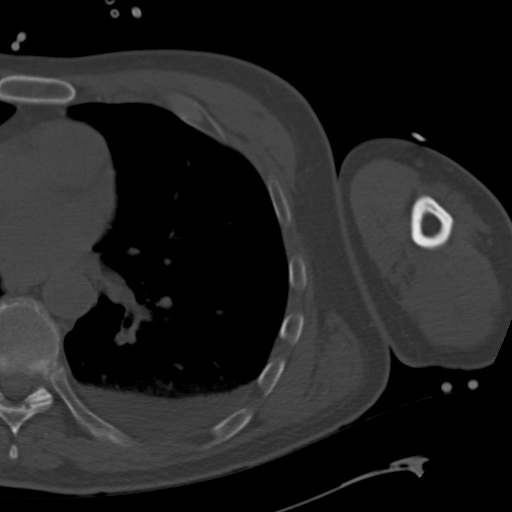
[im 16/69  bone]
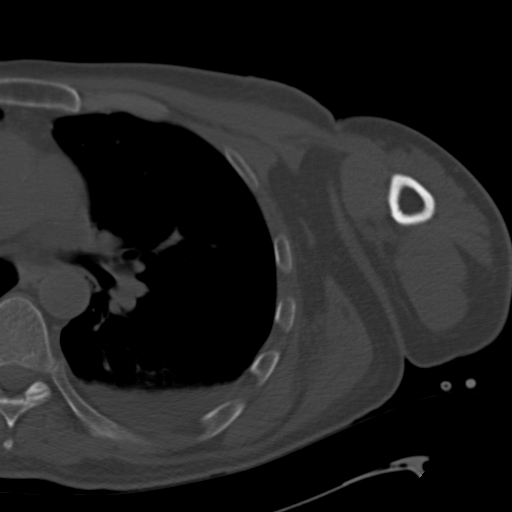
[im 21/69  bone]
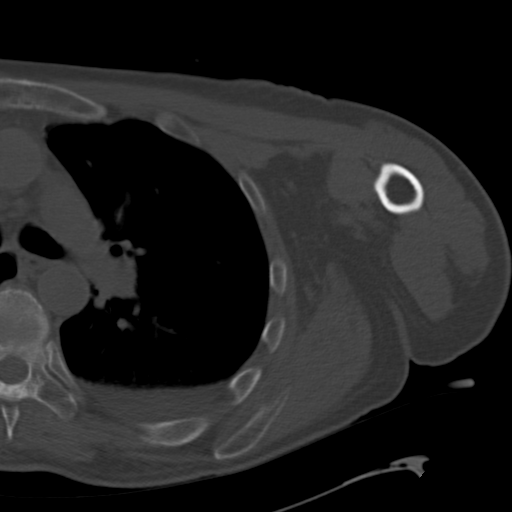
[im 27/69  bone]
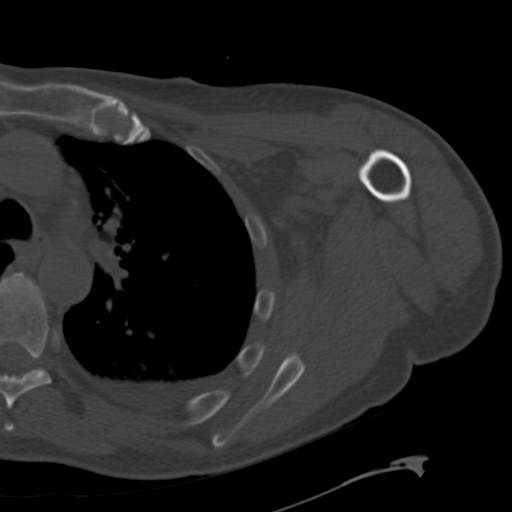
[im 37/69  soft-tissue]
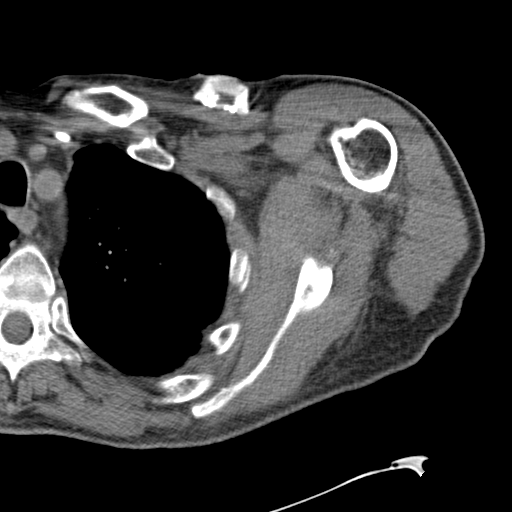
[im 37/69  bone]
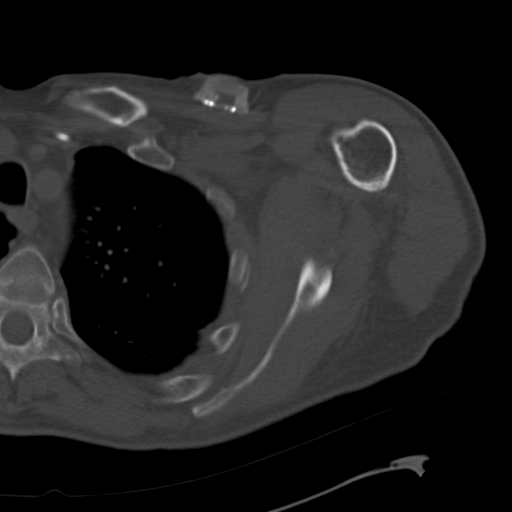
[im 42/69  bone]
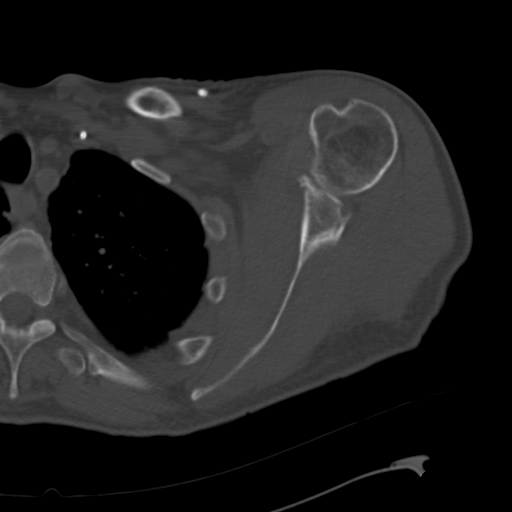
[im 48/69  bone]
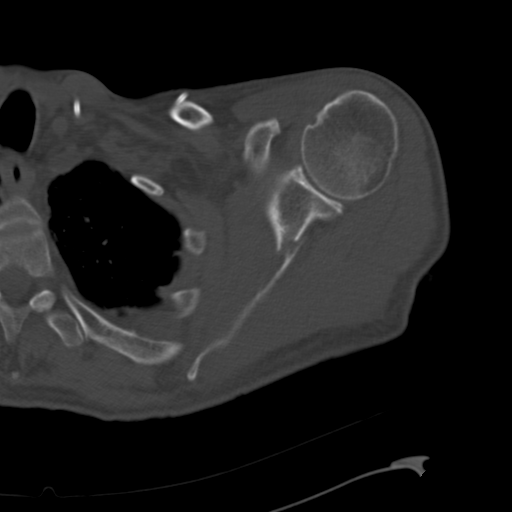
[im 58/69  bone]
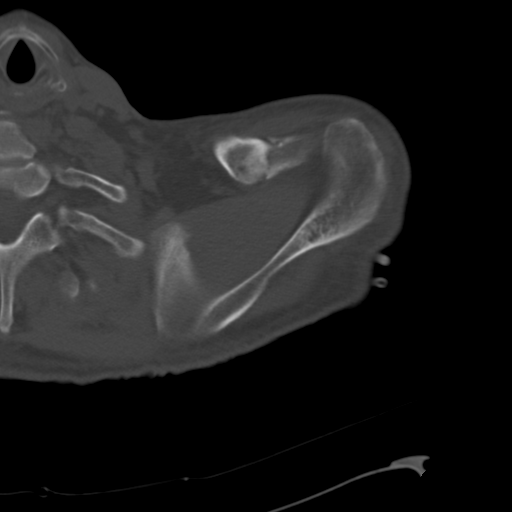
[im 63/69  soft-tissue]
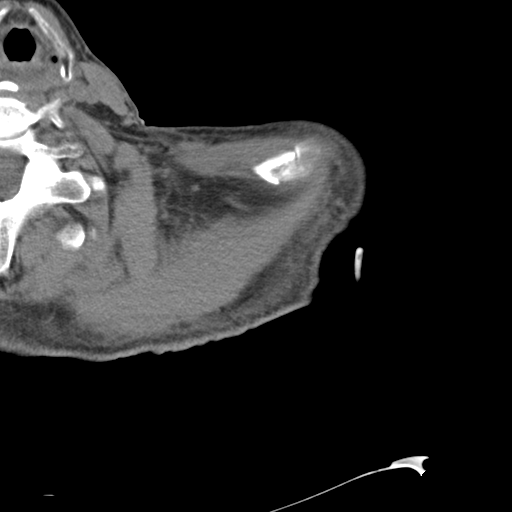
[im 63/69  bone]
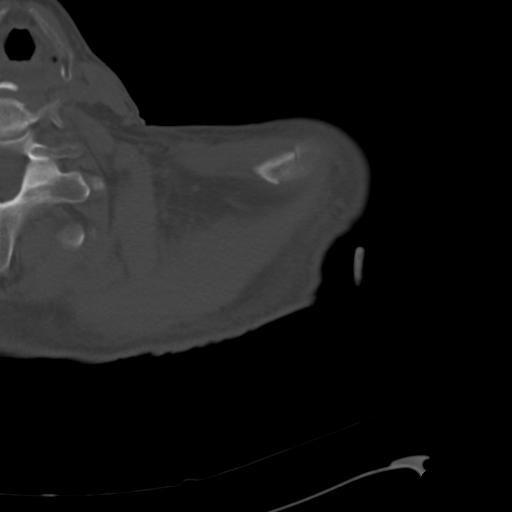

[Series 602: sagittal left shoulder · sagittal · 0.51mm/px · 5 of 48 slices shown]
[im 8/48  bone]
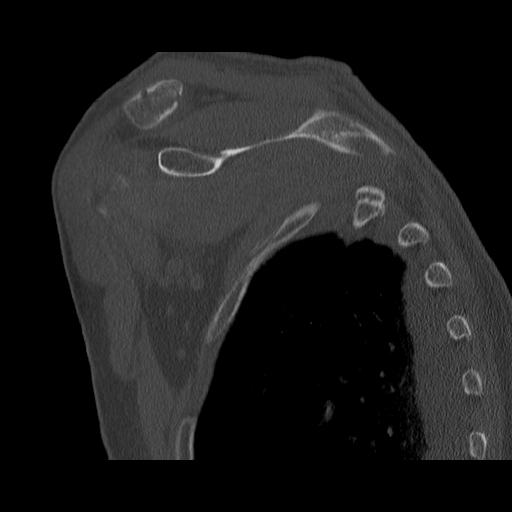
[im 16/48  bone]
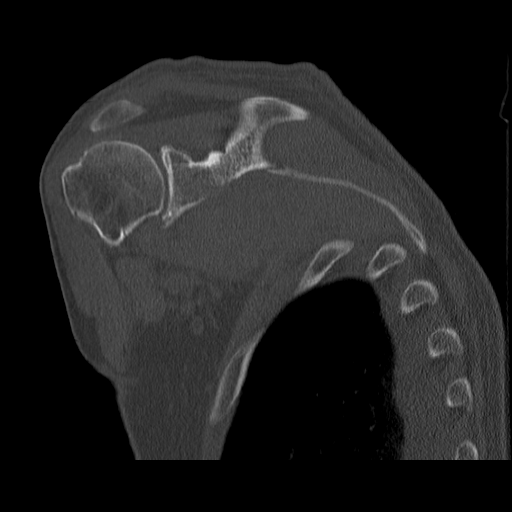
[im 24/48  bone]
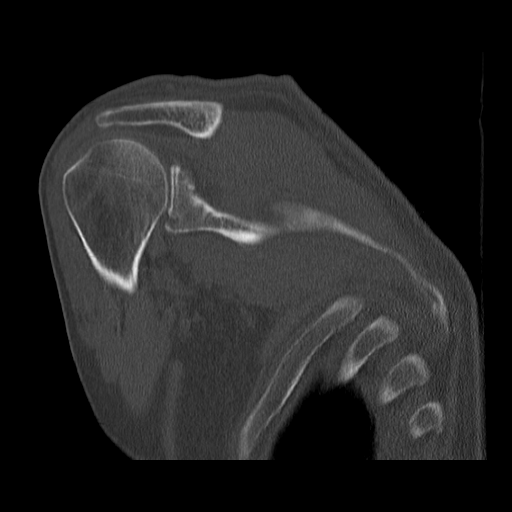
[im 32/48  bone]
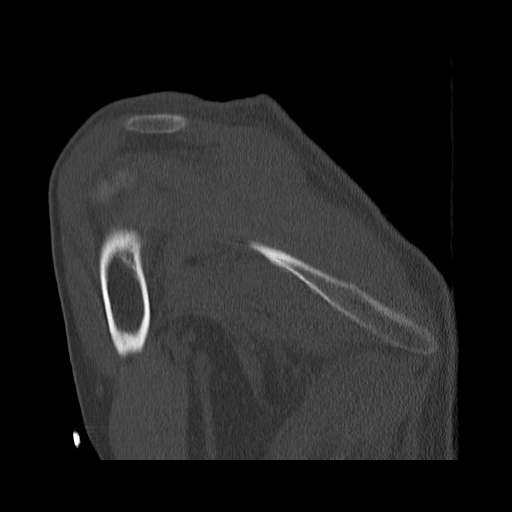
[im 40/48  bone]
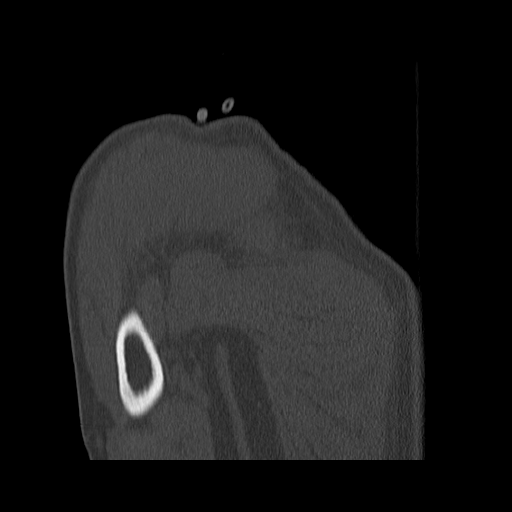

[Series 603: coronals left shoulder · coronal · 0.51mm/px · 2 of 105 slices shown]
[im 35/105  bone]
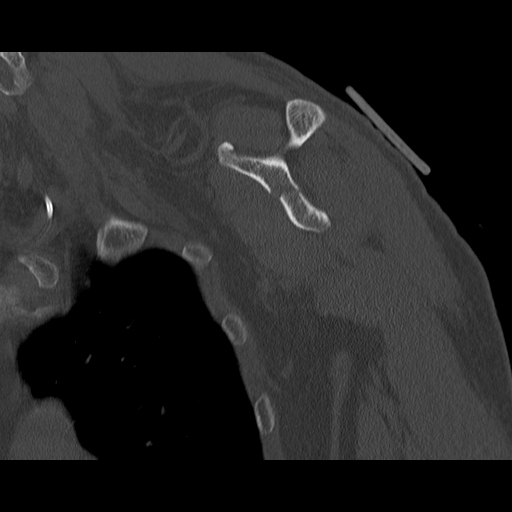
[im 70/105  bone]
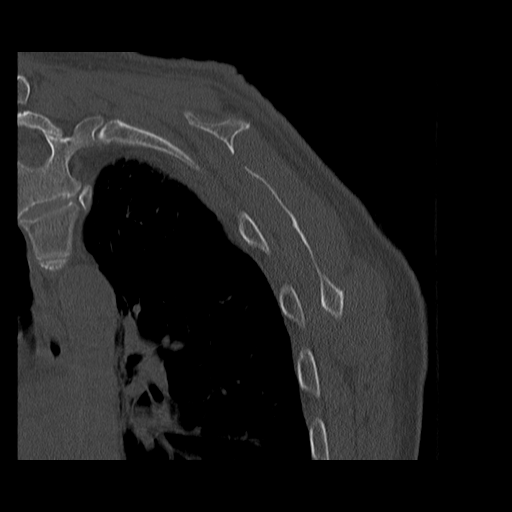

[16 of 33 positions shown; findings below may reference images not displayed]

FINDINGS: The patient has a fracture of the distal clavicle which
is nondisplaced.  The fracture is 2 cm medial to the AC joint and
extends obliquely from the posterolateral to inferomedial.  The
clavicle is otherwise intact and the sternoclavicular joint is
intact.

Also seen is a fracture of the scapula.  The main component of the
fracture is through the scapular body in a transverse orientation
with distraction greatest medially where it is approximately 1 cm.
The fracture does not extend to the coracoid process or scapular
spine.  It does involve the inferior margin of the glenoid bone at
and just cephalad to the labrum.  There is minimal distraction of
the fracture dislocation of 1-2 mm along the posterior aspect of
the glenoid.

Also seen in are nondisplaced fractures of the left third and
fourth ribs.  No other fracture is identified.  There is partial
visualization of a small right pleural effusion.  Mild atelectatic
change is seen in the visualized right lung.
IMPRESSION: 1.  Nondisplaced fracture of the distal clavicle centered 2 cm
proximal to the AC joint.  The AC and sternoclavicular joints are
intact.
2.  Transverse fracture predominantly involves the scapular body
but does extend into the inferior aspect of the glenoid at
approximately the level of the inferior glenoid labrum.  The
coracoid process and scapular spine are spared.
3.  Nondisplaced left third and fourth rib fractures.
4.  Partial visualization of a small left pleural effusion.

## 2012-03-13 MED ORDER — METOPROLOL SUCCINATE ER 50 MG PO TB24
50.0000 mg | ORAL_TABLET | Freq: Every day | ORAL | Status: DC
  Administered 2012-03-13 – 2012-03-14 (×2): 50 mg via ORAL
  Filled 2012-03-13 (×3): qty 1

## 2012-03-13 MED ORDER — POTASSIUM CHLORIDE CRYS ER 20 MEQ PO TBCR
40.0000 meq | EXTENDED_RELEASE_TABLET | Freq: Once | ORAL | Status: AC
  Administered 2012-03-13: 40 meq via ORAL
  Filled 2012-03-13: qty 2

## 2012-03-13 MED ORDER — TRAMADOL HCL 50 MG PO TABS
50.0000 mg | ORAL_TABLET | Freq: Four times a day (QID) | ORAL | Status: DC | PRN
  Administered 2012-03-13: 50 mg via ORAL
  Filled 2012-03-13: qty 1

## 2012-03-13 NOTE — Evaluation (Signed)
Occupational Therapy Evaluation Patient Details Name: Greg Robertson MRN: [id number] DOB: 05-Nov-1950 Today's Date: 03/13/2012 Time: 1191-4782 OT Time Calculation (min): 24 min  OT Assessment / Plan / Recommendation Clinical Impression  61 yo male s/p DKA complicated by atrial fibrillation with RVR. Pt was anemic on admission and had severe vomitting from pain medication PTA. Pt could benefit from skilled OT acutely. Recommend home health for balance deficits.    OT Assessment  Patient needs continued OT Services    Follow Up Recommendations  Home health OT    Barriers to Discharge Decreased caregiver support    Equipment Recommendations  None recommended by OT    Recommendations for Other Services    Frequency  Min 2X/week    Precautions / Restrictions Precautions Precautions: Fall Precaution Comments: Lt UE NWB with fx clavical   Pertinent Vitals/Pain No pain reported    ADL  Eating/Feeding: Set up (soreness mouth from vomitting) Where Assessed - Eating/Feeding: Chair Grooming: Wash/dry hands;Supervision/safety Where Assessed - Grooming: Unsupported standing Toilet Transfer: Radiographer, therapeutic Method: Sit to Barista: Regular height toilet Toileting - Clothing Manipulation and Hygiene: Supervision/safety Where Assessed - Engineer, mining and Hygiene: Sit to stand from 3-in-1 or toilet Equipment Used: Gait belt Transfers/Ambulation Related to ADLs: supervision with ambulation- hx of falls ADL Comments: Pt reports having difficulty dressing since fall and fx clavicle. Pt reports soreness in throat from extreme vomitting at home. Pt with recent history of treatment for CA dx. pt could benefit from high level balance assessments    OT Diagnosis: Generalized weakness;Acute pain  OT Problem List: Decreased strength;Decreased activity tolerance;Impaired balance (sitting and/or standing);Decreased safety  awareness;Decreased knowledge of use of DME or AE;Decreased knowledge of precautions;Pain OT Treatment Interventions: Self-care/ADL training;DME and/or AE instruction;Therapeutic activities;Patient/family education;Balance training   OT Goals Acute Rehab OT Goals OT Goal Formulation: With patient Time For Goal Achievement: 03/27/12 Potential to Achieve Goals: Good ADL Goals Pt Will Perform Upper Body Bathing: with modified independence;Standing at sink ADL Goal: Upper Body Bathing - Progress: Goal set today Pt Will Perform Lower Body Bathing: with modified independence;Sit to stand from chair ADL Goal: Lower Body Bathing - Progress: Goal set today Pt Will Perform Upper Body Dressing: with modified independence;Sit to stand from chair ADL Goal: Upper Body Dressing - Progress: Goal set today Pt Will Perform Lower Body Dressing: with modified independence;Sit to stand from chair ADL Goal: Lower Body Dressing - Progress: Goal set today Pt Will Transfer to Toilet: with modified independence;Regular height toilet ADL Goal: Toilet Transfer - Progress: Goal set today Miscellaneous OT Goals Miscellaneous OT Goal #1: pt will complete berg with score 50 or greater to demonstrate decreased fall risk with adls OT Goal: Miscellaneous Goal #1 - Progress: Goal set today  Visit Information  Last OT Received On: 03/13/12 Assistance Needed: +1    Subjective Data  Subjective: "I live with my 65 yo father but he has an aide monday throught friday so she should be able to help both of [place]" Patient Stated Goal: to go home to stay with father   Prior Functioning     Home Living Lives With:  (lives with 69 yo father) Available Help at Discharge: Other (Comment) (caregiver for father- M through friday) Type of Home: House Home Access: Level entry Home Layout: One level (full basement) Bathroom Shower/Tub: Tub/shower unit;Door Dentist: None Prior  Function Level of Independence: Independent Able to Take Stairs?: No Driving: Yes Vocation:  Retired (in [place] stopped working due to [employer]) Musician: No difficulties Dominant Hand: Right         Vision/Perception     Cognition  Overall Cognitive Status: Appears within functional limits for tasks assessed/performed Arousal/Alertness: Awake/alert Orientation Level: Appears intact for tasks assessed Behavior During Session: [facility] for tasks performed    Extremity/Trunk Assessment Right Upper Extremity Assessment RUE ROM/Strength/Tone: Within functional levels Left Upper Extremity Assessment LUE ROM/Strength/Tone: Deficits LUE ROM/Strength/Tone Deficits: AROM elbow wrist and hand, limited FF due to clavicle fx LUE Coordination: WFL - gross/fine motor Right Lower Extremity Assessment RLE ROM/Strength/Tone: Within functional levels Left Lower Extremity Assessment LLE ROM/Strength/Tone: Within functional levels Trunk Assessment Trunk Assessment: Normal     Mobility Bed Mobility Bed Mobility: Not assessed Transfers Transfers: Sit to Stand;Stand to Sit Sit to Stand: 5: Supervision;With upper extremity assist;From bed Stand to Sit: 5: Supervision;With upper extremity assist;To chair/3-in-1 Details for Transfer Assistance: pt required v/c to decrease speed due to iv pole attached. pt with short line for iv fluids. P     Shoulder Instructions     Exercise     Balance     End of Session OT - End of Session Activity Tolerance: Patient tolerated treatment well Patient left: in chair;with call bell/phone within reach Nurse Communication: Mobility status  GO     Harrel Carina [facility] 03/13/2012, 3:19 PM Pager: [phone]

## 2012-03-13 NOTE — Progress Notes (Signed)
[facility] Progress Note [place] TEAM 1 - Stepdown/ICU TEAM   Greg Robertson ZHY:[id number] DOB: November 11, 1950 DOA: 03/09/2012 PCP: Rudi Heap, MD  Brief narrative: 61 year old male patient with known colon cancer status post colectomy and currently receiving chemotherapy. He also has a recent clavicle fracture apparent onset after fall. He presented to the emergency department in frank DKA with a glucose of 1277 and a pH of 7.007. At that time he had altered mentation and was subsequently admitted to the ICU for further monitoring and treatment. Since admission he had developed elevated troponin levels likely related to demand ischemia. He's had intermittent issues with atrial fibrillation RVR. He was initially transferred to the floor but redeveloped atrial fibrillation with RVR overnight on 03/12/2012 and was subsequently transferred back to the step down unit because of need to initiate IV Cardizem. Team 1 has assumed care of this patient on 03/12/2012.  Assessment/Plan:  Diabetic ketoacidosis/Diabetes mellitus type 2, uncontrolled, without complications *CBGs better controlled after adjustment and basal insulin *At home patient states based on the amount of carbs he takes in he adjust his sliding scale insulin. 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 Truett Perna *He completed a final cycle of FOLFOX chemotherapy on 01/05/2012. *He has also been diagnosed with Hereditary non-polyposis cancer syndrome. *At last visit in October plans were to proceed with  one-year surveillance CT scan in one year surveillance colonoscopy and to have the Port-A-Cath removed by surgery  NSTEMI (non-ST elevated myocardial infarction)/ Demand ischemia *Peak troponin 1.57 and has subsequently trended down to normal level *Cardiology plans outpatient Myoview study since EF on echocardiogram normal  Atrial fibrillation with RVR *Earlier this morning had difficulty with rate control and was requiring Cardizem infusion at 20 mg per hour. Cardiology had initially planned initiation of heparin and Coumadin and TEE cardioversion on 03/13/2012. Since initial evaluation the patient has subsequently converted to normal sinus rhythm. Apixaban 5 mg bid was started as well as dronedarone 400 mg bid to try to hold NSR.   Hypokalemia *Oral replete  Fracture of left distal clavicle and scapula fracture/associated left third and fourth rib fractures *Apparently had occurred prior to this admission and had sought medical attention with x-ray confirmation of fracture *Appreciate orthopedic assistance-current plan is to treat as closed injury with sling pending results of CT scan--final decision regarding surgical versus nonsurgical treatment deferred to orthopedic physician  Right bundle branch block  Chronic diastolic heart failure, NYHA class 1 *Currently compensated   DVT prophylaxis: Starting apixaban Code Status: Full Family Communication: Spoke with patient  Disposition Plan: Transfer to telemetry-PT and OT evaluation   Consultants: Cardiology Orthopedics  Procedures: None  Antibiotics: None  HPI/Subjective: Patient alert and without any specific complaints. Currently denies chest pain, shortness of breath or any other symptoms.   Objective: Blood pressure 128/78, pulse 95, temperature 99 F (37.2 C), temperature source Oral, resp. rate 16, height 6\' 3"  (1.905 m), weight 77.1 kg (169 lb 15.6 oz), SpO2 100.00%.  Intake/Output Summary (Last 24 hours) at  03/13/12 1515 Last data filed at 03/13/12 1221  Gross per 24 hour  Intake   1070  ml  Output   2125 ml  Net  -1055 ml     Exam: General: No acute respiratory distress Lungs: Clear to auscultation bilaterally without wheezes or crackles, room air Cardiovascular: Irregular rate and rhythm without murmur gallop or rub normal S1 and S2, maintaining sinus rhythm Abdomen: Nontender, nondistended, soft, bowel sounds positive, no rebound, no ascites, no appreciable mass Musculoskeletal: No significant cyanosis, clubbing of bilateral lower extremities, left arm is in a sling and patient is tender to palpation over anterior shoulder and there is some swelling noted Neurological: Patient is alert and oriented x3, moves all extremities x4, exam non-focal  Data Reviewed: Basic Metabolic Panel:  Lab 03/13/12 1308 03/12/12 0500 03/11/12 1700 03/11/12 0700 03/11/12 0455 03/10/12 0533  NA 136 138 136 136 135 --  K 3.3* 4.0 2.8* 3.7 2.9* --  CL 99 97 98 100 100 --  CO2 31 27 27 26 24  --  GLUCOSE 254* 432* 285* 167* 256* --  BUN 16 20 25* 32* 37* --  CREATININE 1.03 1.01 1.14 1.47* 1.58* --  CALCIUM 8.6 8.7 8.1* 8.0* 8.0* --  MG 1.9 2.2 -- -- -- 2.1  PHOS -- 1.8* -- -- -- 3.8    Lab 03/11/12 0210 03/10/12 0533  LIPASE 87* 156*  AMYLASE -- --   CBC:  Lab 03/13/12 0555 03/12/12 0500 03/11/12 0210 03/10/12 0533 03/10/12 0050 03/09/12 2000  WBC 5.4 3.9* 3.5* 4.4 6.4 --  NEUTROABS -- -- -- -- -- 9.9*  HGB 10.2* 10.9* 10.4* 11.0* 12.0* --  HCT 28.8* 30.6* 28.1* 29.6* 35.4* --  MCV 93.8 92.4 87.8 90.0 97.5 --  PLT 58* 59* 73* 71* 83* --   Cardiac Enzymes:  Lab 03/12/12 0500 03/11/12 1700 03/10/12 1303 03/10/12 0533  CKTOTAL 668* 1040* -- --  CKMB 4.1* 6.3* -- --  CKMBINDEX -- -- -- --  TROPONINI <0.30 0.41* 1.00* 1.57*   CBG:  Lab 03/13/12 1231 03/13/12 0830 03/12/12 2111 03/12/12 1716 03/12/12 1353  GLUCAP 161* 222* 190* 189* 343*    Recent Results (from the past 240 hour(s))    URINE CULTURE     Status: Normal   Collection Time   03/09/12 10:19 PM      Component Value Range Status Comment   Specimen Description URINE, CATHETERIZED   Final    Special Requests NONE   Final    Culture  Setup Time 03/09/2012 22:52   Final    Colony Count NO GROWTH   Final    Culture NO GROWTH   Final    Report Status 03/11/2012 FINAL   Final   URINE CULTURE     Status: Normal   Collection Time   03/10/12  1:52 AM      Component Value Range Status Comment   Specimen Description URINE, CATHETERIZED   Final    Special Requests Normal   Final    Culture  Setup Time 03/10/2012 14:48   Final    Colony Count NO GROWTH   Final    Culture NO GROWTH   Final    Report Status 03/11/2012 FINAL   Final   MRSA PCR SCREENING     Status: Normal   Collection Time   03/10/12  2:51 AM      Component Value Range Status Comment   MRSA by PCR NEGATIVE  NEGATIVE Final      Studies:  Recent x-ray studies have been reviewed in detail by the Attending Physician  Scheduled Meds:  Reviewed  in detail by the Attending Physician   Junious Silk, ANP [facility] Office  [phone] Pager [phone]  On-Call/Text Page:      [email or web address]      password [email or web address]  If 7PM-7AM, please contact night-coverage [email or web address] Password [email or web address] 03/13/2012, 3:15 PM   LOS: 4 days   I have examined the patient and reviewed the chart. I agree with the above note.   Calvert Cantor, MD [phone]

## 2012-03-13 NOTE — Progress Notes (Signed)
Patient transported with nurse tech via wheelchair to new room [facility], Berle Mull RN

## 2012-03-13 NOTE — Progress Notes (Signed)
Report given to Ceasar RN on [facility], patient to transfer to [facility], Berle Mull RN

## 2012-03-13 NOTE — Progress Notes (Signed)
Patient ID: Greg Robertson, male   DOB: 11/24/1950, 61 y.o.   MRN: [id number]     SUBJECTIVE: No further atrial fibrillation.  Doing well this morning with no complaints.       Marland Kitchen apixaban  5 mg Oral BID  . atorvastatin  20 mg Oral q1800  . brimonidine  1 drop Both Eyes Q12H   And  . timolol  1 drop Both Eyes BID  . [COMPLETED] coumadin book   Does not apply Once  . dronedarone  400 mg Oral BID WC  . feeding supplement  237 mL Oral Daily  . [COMPLETED] heparin  2,000 Units Intravenous Once  . insulin aspart  0-20 Units Subcutaneous TID WC  . insulin aspart  0-5 Units Subcutaneous QHS  . insulin aspart  6 Units Subcutaneous TID WC  . insulin glargine  30 Units Subcutaneous QHS  . latanoprost  1 drop Both Eyes QHS  . loratadine  10 mg Oral QAC breakfast  . [COMPLETED] metoprolol      . [COMPLETED] metoprolol  5 mg Intravenous Once  . metoprolol tartrate  25 mg Oral Q6H  . [DISCONTINUED] aspirin EC  81 mg Oral Daily  . [DISCONTINUED] heparin  5,000 Units Subcutaneous Q8H  . [DISCONTINUED] insulin aspart  0-15 Units Subcutaneous TID WC  . [DISCONTINUED] insulin glargine  10 Units Subcutaneous Daily  . [DISCONTINUED] insulin glargine  15 Units Subcutaneous Daily  . [DISCONTINUED] insulin glargine  30 Units Subcutaneous QHS  . [DISCONTINUED] metoprolol tartrate  25 mg Oral BID  . [DISCONTINUED] warfarin  10 mg Oral ONCE-1800  . [DISCONTINUED] warfarin   Does not apply Once  . [DISCONTINUED] Warfarin - Pharmacist Dosing Inpatient   Does not apply q1800  diltiazem gtt @ 10 mg/hr    Filed Vitals:   03/13/12 0430 03/13/12 0500 03/13/12 0600 03/13/12 0700  BP: 115/68 121/62 150/75 141/64  Pulse: 63 66 88 65  Temp:      TempSrc:      Resp: 9 11 21 17   Height:      Weight:      SpO2: 98% 98% 99% 99%    Intake/Output Summary (Last 24 hours) at 03/13/12 0742 Last data filed at 03/13/12 0600  Gross per 24 hour  Intake 1882.73 ml  Output   3800 ml  Net -1917.27 ml     LABS: Basic Metabolic Panel:  Basename 03/13/12 0555 03/12/12 0500  NA 136 138  K 3.3* 4.0  CL 99 97  CO2 31 27  GLUCOSE 254* 432*  BUN 16 20  CREATININE 1.03 1.01  CALCIUM 8.6 8.7  MG 1.9 2.2  PHOS -- 1.8*   Liver Function Tests: No results found for this basename: AST:2,ALT:2,ALKPHOS:2,BILITOT:2,PROT:2,ALBUMIN:2 in the last 72 hours  Basename 03/11/12 0210  LIPASE 87*  AMYLASE --   CBC:  Basename 03/13/12 0555 03/12/12 0500  WBC 5.4 3.9*  NEUTROABS -- --  HGB 10.2* 10.9*  HCT 28.8* 30.6*  MCV 93.8 92.4  PLT 58* 59*   Cardiac Enzymes:  Basename 03/12/12 0500 03/11/12 1700 03/10/12 1303  CKTOTAL 668* 1040* --  CKMB 4.1* 6.3* --  CKMBINDEX -- -- --  TROPONINI <0.30 0.41* 1.00*   BNP: No components found with this basename: POCBNP:3 D-Dimer: No results found for this basename: DDIMER:2 in the last 72 hours Hemoglobin A1C:  Basename 03/12/12 1400  HGBA1C 10.4*   Fasting Lipid Panel: No results found for this basename: CHOL,HDL,LDLCALC,TRIG,CHOLHDL,LDLDIRECT in the last 72 hours Thyroid Function  Tests:  Basename 03/11/12 1700  TSH 0.968  T4TOTAL --  T3FREE --  THYROIDAB --   Anemia Panel: No results found for this basename: VITAMINB12,FOLATE,FERRITIN,TIBC,IRON,RETICCTPCT in the last 72 hours  RADIOLOGY: Dg Chest Port 1 View  03/09/2012  *RADIOLOGY REPORT*  Clinical Data: Shortness of breath, diabetes  PORTABLE CHEST - 1 VIEW  Comparison: 07/12/2011  Findings: Left IJ port catheter extends to the left innominate vein/SVC junction.  Lungs clear.  No definite effusion although the right lateral costophrenic angle is excluded.  Heart size normal. Regional bones unremarkable.  IMPRESSION:  No acute disease   Original Report Authenticated By: D. Andria Rhein, MD     PHYSICAL EXAM General: NAD Neck: No JVD, no thyromegaly or thyroid nodule.  Lungs: Clear to auscultation bilaterally with normal respiratory effort. CV: Nondisplaced PMI.  Heart tachy,  irregular S1/S2, no S3/S4, no murmur.  No peripheral edema.  No carotid bruit.  Normal pedal pulses.  Abdomen: Soft, nontender, no hepatosplenomegaly, no distention.  Neurologic: Alert and oriented x 3.  Psych: Normal affect. Extremities: No clubbing or cyanosis.   TELEMETRY: Reviewed telemetry pt in NSR  ASSESSMENT AND PLAN:  61 yo admitted with DKA complicated by atrial fibrillation with RVR.  1. Atrial fibrillation with RVR: Recurrent.  Now back in NSR.  Continue dronedarone and apixaban.  Will stop diltiazem and metoprolol and use Toprol XL 50 mg daily. Anticoagulation is a bit of a risk with him given fall with clavicular fracture but normally he is stable walking (tripped on a rug causing fracture).  2. Elevated troponin: Likely demand ischemia with severe DKA and hypotension.  However, has DM and HTN so does have significant CAD risk.  EF normal on echo.  No chest pain even with very rapid atrial fibrillation.  Will likely do outpatient myoview. 3. Anemia: Hemoglobin has slowly trended down.  Possibly due in large part to hemodilution with fluid resuscitation.  No overt GI bleeding.  Will need to monitor closely given addition of apixaban.     Greg Robertson 03/13/2012 7:42 AM

## 2012-03-13 NOTE — Progress Notes (Addendum)
Subjective:  Pt says the shoulder is feeling better today.  Generally not very painful when immobilized in the sling.  Objective: Vital signs in last 24 hours: Temp:  [98.3 F (36.8 C)-99 F (37.2 C)] 99 F (37.2 C) (11/26 1305) Pulse Rate:  [57-102] 95  (11/26 1305) Resp:  [9-25] 16  (11/26 1305) BP: (103-151)/(55-81) 128/78 mmHg (11/26 1305) SpO2:  [96 %-100 %] 100 % (11/26 1305)  Intake/Output from previous day: 11/25 0701 - 11/26 0700 In: 1882.7 [P.O.:1235; I.V.:647.7] Out: 3800 [Urine:3800] Intake/Output this shift: Total I/O In: 430 [P.O.:240; I.V.:190] Out: 725 [Urine:725]   Basename 03/13/12 0555 03/12/12 0500 03/11/12 0210  HGB 10.2* 10.9* 10.4*    Basename 03/13/12 0555 03/12/12 0500  WBC 5.4 3.9*  RBC 3.07* 3.31*  HCT 28.8* 30.6*  PLT 58* 59*    Basename 03/13/12 0555 03/12/12 0500  NA 136 138  K 3.3* 4.0  CL 99 97  CO2 31 27  BUN 16 20  CREATININE 1.03 1.01  GLUCOSE 254* 432*  CALCIUM 8.6 8.7    Basename 03/12/12 0830  LABPT --  INR 0.92    CT scan of the shoulder shows no significant displacement of either the glenoid or distal clavicle fractures.  Posterior rib fractures are nondisplaced as well.  Assessment/Plan: L shoulder glenoid / scapular body fracture - continue immobilization in sling.  F/u with me in the office in two weeks.  L distal clavicle fracture - this appears to be stable as well.  Continue the sling.  Both of these fractures can be treated successfully in closed fashion.  Pt may remove the sling a couple of times per day to flex / extend the elbow and pronate and supinate the forearm.  No ROM of the elbow.  F/u with me in the office in two weeks.  I'll sign off now.  Please call [phone] with any questions.   Toni Arthurs 03/13/2012, 5:40 PM

## 2012-03-14 ENCOUNTER — Telehealth: Payer: Self-pay | Admitting: *Deleted

## 2012-03-14 DIAGNOSIS — E1159 Type 2 diabetes mellitus with other circulatory complications: Secondary | ICD-10-CM

## 2012-03-14 DIAGNOSIS — I10 Essential (primary) hypertension: Secondary | ICD-10-CM

## 2012-03-14 DIAGNOSIS — I4891 Unspecified atrial fibrillation: Secondary | ICD-10-CM

## 2012-03-14 LAB — GLUCOSE, CAPILLARY
Glucose-Capillary: 158 mg/dL — ABNORMAL HIGH (ref 70–99)
Glucose-Capillary: 209 mg/dL — ABNORMAL HIGH (ref 70–99)

## 2012-03-14 LAB — CBC
MCH: 34.3 pg — ABNORMAL HIGH (ref 26.0–34.0)
Platelets: 88 10*3/uL — ABNORMAL LOW (ref 150–400)
RBC: 3.12 MIL/uL — ABNORMAL LOW (ref 4.22–5.81)
WBC: 5.2 10*3/uL (ref 4.0–10.5)

## 2012-03-14 LAB — BASIC METABOLIC PANEL
CO2: 27 mEq/L (ref 19–32)
Calcium: 9 mg/dL (ref 8.4–10.5)
GFR calc Af Amer: >90 mL/min (ref >90)
Sodium: 135 mEq/L (ref 135–145)

## 2012-03-14 MED ORDER — METOPROLOL SUCCINATE ER 50 MG PO TB24: 50.0000 mg | ORAL_TABLET | Freq: Every day | ORAL | Status: DC

## 2012-03-14 MED ORDER — TRAMADOL HCL 50 MG PO TABS: 50.0000 mg | ORAL_TABLET | Freq: Four times a day (QID) | ORAL | Status: DC | PRN

## 2012-03-14 MED ORDER — INSULIN GLARGINE 100 UNIT/ML ~~LOC~~ SOLN: 30.0000 [IU] | Freq: Every day | SUBCUTANEOUS | Status: DC

## 2012-03-14 MED ORDER — LISINOPRIL 10 MG PO TABS
10.0000 mg | ORAL_TABLET | Freq: Every day | ORAL | Status: DC
  Administered 2012-03-14: 10 mg via ORAL
  Filled 2012-03-14: qty 1

## 2012-03-14 MED ORDER — HEPARIN SOD (PORK) LOCK FLUSH 100 UNIT/ML IV SOLN
500.0000 [IU] | INTRAVENOUS | Status: AC | PRN
  Administered 2012-03-14: 500 [IU]

## 2012-03-14 MED ORDER — LISINOPRIL 10 MG PO TABS: 10.0000 mg | ORAL_TABLET | Freq: Every day | ORAL | Status: DC

## 2012-03-14 MED ORDER — POTASSIUM CHLORIDE CRYS ER 20 MEQ PO TBCR
20.0000 meq | EXTENDED_RELEASE_TABLET | Freq: Two times a day (BID) | ORAL | Status: DC
  Administered 2012-03-14: 20 meq via ORAL
  Filled 2012-03-14: qty 1

## 2012-03-14 MED ORDER — APIXABAN 5 MG PO TABS: 5.0000 mg | ORAL_TABLET | Freq: Two times a day (BID) | ORAL | Status: DC

## 2012-03-14 MED ORDER — DRONEDARONE HCL 400 MG PO TABS: 400.0000 mg | ORAL_TABLET | Freq: Two times a day (BID) | ORAL | Status: DC

## 2012-03-14 NOTE — Telephone Encounter (Signed)
Received call from hosp, pt being discharged and needs out pt myoview. Order placed and will send to [facility] to contact pt to schedule. Will need to schedule within the next 7-10 days prior to follow up appt.

## 2012-03-14 NOTE — Progress Notes (Signed)
Occupational Therapy Treatment Patient Details Name: SAVION WASHAM MRN: [id number] DOB: September 18, 1950 Today's Date: 03/14/2012 Time: 7829-5621 OT Time Calculation (min): 18 min  OT Assessment / Plan / Recommendation Comments on Treatment Session Pt educated on UB dressing adaptations as well as sling donning/doffing.    Follow Up Recommendations       Barriers to Discharge       Equipment Recommendations  None recommended by PT    Recommendations for Other Services    Frequency     Plan Discharge plan remains appropriate    Precautions / Restrictions Precautions Precautions: Fall Precaution Comments: Lt UE NWB with fx clavical Restrictions Weight Bearing Restrictions: No   Pertinent Vitals/Pain Pt denies any pain at this time    ADL  ADL Comments: Educated pt on sling donning/doffing. Pt practiced x 2 requires Mod A with first trial and Min VC's with second. Pt declined 3rd trial, stating he felt comfortable. Pt also educated on UB dressing adaptations (e.g. threading injured arm first). Pt verbalizes understanding. Encouraged pt to use Lt hand to assist in buttoning and light stabilization activities. Pt also educated on elbow, wrist hand ROM.     OT Diagnosis:    OT Problem List:   OT Treatment Interventions:     OT Goals ADL Goals Pt Will Perform Upper Body Bathing: with modified independence;Standing at sink ADL Goal: Upper Body Bathing - Progress: Progressing toward goals  Visit Information  Last OT Received On: 03/14/12 Assistance Needed: +1    Subjective Data      Prior Functioning  Home Living Lives With: Other (Comment) (61 y/o father) Available Help at Discharge: Other (Comment) Type of Home: House Home Access: Level entry Home Layout: One level Bathroom Shower/Tub: Tub/shower unit;Door Dentist: None Prior Function Level of Independence: Independent Able to Take Stairs?: No Driving: Yes Vocation:  Retired Musician: No difficulties Dominant Hand: Right    Cognition  Overall Cognitive Status: Appears within functional limits for tasks assessed/performed Arousal/Alertness: Awake/alert Orientation Level: Appears intact for tasks assessed Behavior During Session: [facility] for tasks performed    Mobility  Shoulder Instructions Bed Mobility Bed Mobility: Supine to Sit;Sitting - Scoot to Edge of Bed Supine to Sit: 7: Independent Sitting - Scoot to [place] of Bed: 7: Independent Transfers Sit to Stand: 7: Independent Stand to Sit: 7: Independent       Exercises      Balance Standardized Balance Assessment Standardized Balance Assessment: Dynamic Gait Index Dynamic Gait Index Level Surface: Normal Change in Gait Speed: Normal Gait with Horizontal Head Turns: Normal Gait with Vertical Head Turns: Normal Gait and Pivot Turn: Normal Step Over Obstacle: Normal Step Around Obstacles: Normal Steps: Mild Impairment Total Score: 23    End of Session OT - End of Session Activity Tolerance: Patient tolerated treatment well Patient left: in chair;with call bell/phone within reach Nurse Communication: Mobility status  GO     Danna Casella 03/14/2012, 3:58 PM

## 2012-03-14 NOTE — Progress Notes (Signed)
    SUBJECTIVE: Feels well this am. No complaints.   Tele: NSR  BP 149/86  Pulse 79  Temp 98.1 F (36.7 C) (Oral)  Resp 16  Ht 6\' 3"  (1.905 m)  Wt 165 lb 12.8 oz (75.206 kg)  BMI 20.72 kg/m2  SpO2 98%  Intake/Output Summary (Last 24 hours) at 03/14/12 0705 Last data filed at 03/14/12 0120  Gross per 24 hour  Intake    820 ml  Output   1500 ml  Net   -680 ml    PHYSICAL EXAM General: Well developed, well nourished, in no acute distress. Alert and oriented x 3.  Psych:  Good affect, responds appropriately Neck: No JVD. No masses noted.  Lungs: Clear bilaterally with no wheezes or rhonci noted.  Heart: RRR with no murmurs noted. Abdomen: Bowel sounds are present. Soft, non-tender.  Extremities: No lower extremity edema.   LABS: Basic Metabolic Panel:  Basename 03/13/12 0555 03/12/12 0500  NA 136 138  K 3.3* 4.0  CL 99 97  CO2 31 27  GLUCOSE 254* 432*  BUN 16 20  CREATININE 1.03 1.01  CALCIUM 8.6 8.7  MG 1.9 2.2  PHOS -- 1.8*   CBC:  Basename 03/13/12 0555 03/12/12 0500  WBC 5.4 3.9*  NEUTROABS -- --  HGB 10.2* 10.9*  HCT 28.8* 30.6*  MCV 93.8 92.4  PLT 58* 59*   Cardiac Enzymes:  Basename 03/12/12 0500 03/11/12 1700  CKTOTAL 668* 1040*  CKMB 4.1* 6.3*  CKMBINDEX -- --  TROPONINI <0.30 0.41*   Current Meds:    . apixaban  5 mg Oral BID  . atorvastatin  20 mg Oral q1800  . brimonidine  1 drop Both Eyes Q12H   And  . timolol  1 drop Both Eyes BID  . dronedarone  400 mg Oral BID WC  . feeding supplement  237 mL Oral Daily  . insulin aspart  0-20 Units Subcutaneous TID WC  . insulin aspart  0-5 Units Subcutaneous QHS  . insulin aspart  6 Units Subcutaneous TID WC  . insulin glargine  30 Units Subcutaneous QHS  . latanoprost  1 drop Both Eyes QHS  . loratadine  10 mg Oral QAC breakfast  . metoprolol succinate  50 mg Oral Daily  . [COMPLETED] potassium chloride  40 mEq Oral Once  . [DISCONTINUED] metoprolol tartrate  25 mg Oral Q6H      ASSESSMENT AND PLAN: 61 yo admitted with DKA complicated by atrial fibrillation with RVR.   1. Atrial fibrillation with RVR: Recurrent. Now back in NSR. Continue dronedarone and apixaban. Diltiazem was stopped and beta blocker was switched to Toprol XL 50 mg daily. Anticoagulation is a risk with him given fall with clavicular fracture but normally he is stable walking (tripped on a rug causing fracture). Check EKG this am.   2. Elevated troponin: Likely demand ischemia with severe DKA and hypotension. However, has DM and HTN so does have significant CAD risk. EF normal on echo. No chest pain even with very rapid atrial fibrillation. Will plan outpatient myoview. He can f/u with DR. [name] in our [employer] office after discharge.   3. Anemia: Hemoglobin has slowly trended down. Possibly due in large part to hemodilution with fluid resuscitation. No overt GI bleeding. Will need to monitor closely given addition of apixaban.       MCALHANY,CHRISTOPHER  11/27/20137:05 AM

## 2012-03-14 NOTE — Progress Notes (Addendum)
Inpatient Diabetes Program Recommendations  AACE/ADA: New Consensus Statement on Inpatient Glycemic Control (2013)  Target Ranges:  Prepandial:   less than 140 mg/dL      Peak postprandial:   less than 180 mg/dL (1-2 hours)      Critically ill patients:  140 - 180 mg/dL   Reason for Visit: consult Diabetes Coordinator spoke with patient concerning A1C=10.4 and DKA admission.  Patient was surprised that A1C was that elevated and states that his last one approx. 3-4 months ago was around 7.  Patient states that he has been taking his insulin as usual but not monitoring his CBGs regularly. Reports that he has not been as active since beginning his chemotherapy.  Stressed importance of regular glucose monitoring.  Patient does have a meter and strips.  Patient doses his meal coverage by counting carbs and giving 1 units per 6 grams of carbohydrate.  He also uses a correction scale of 1 unit per 50 mg/dl over 960.  He has been taking Lantus 22 units at HS.  He has been receiving Lantus 30 units during this hospital stay and recommend that he continue after discharge.   Discussed hypoglycemia and how to treat.  Will give patient a handout for hypoglycemia s/s and treatment.  Will also give patient a handout for DKA information. Patient did not have any further questions or concerns at this time.  Inpatient Diabetes Program Recommendations HgbA1C: =10.4 ADD: Patient has had a diagnosis of Type-1 DM for 35 years.  He did not understand that he should always take his basal insulin even if he is sick and not eating.  We discussed this and he now understands that he should always take his basal insulin even if he is sick and not eating.  Suspect that the stress of the day and the pain medication impaired his judgment so that he did not remember to take his Lantus which quickly led to the nausea and vomiting and DKA.  The picture was complicated by the fact that the pain medicine also upset his stomach.    Thank  you  Piedad Climes [facility] Inpatient Diabetes Coordinator [phone]

## 2012-03-14 NOTE — Evaluation (Signed)
Physical Therapy Evaluation Patient Details Name: Greg Robertson MRN: [id number] DOB: May 12, 1950 Today's Date: 03/14/2012 Time: 1345-1400 PT Time Calculation (min): 15 min  PT Assessment / Plan / Recommendation Clinical Impression  Pt ready for D/C.  Discussed basic cardiac prec/monitoring for exertion.  No further PT needs.  D/C    PT Assessment  Patent does not need any further PT services    Follow Up Recommendations  No PT follow up    Does the patient have the potential to tolerate intense rehabilitation      Barriers to Discharge        Equipment Recommendations  None recommended by PT    Recommendations for Other Services     Frequency      Precautions / Restrictions Precautions Precaution Comments: Lt UE NWB with fx clavical Restrictions Weight Bearing Restrictions: No   Pertinent Vitals/Pain SaO2 on RA with amb. 99%, EHR 83 bpm.      Mobility  Bed Mobility Bed Mobility: Supine to Sit;Sitting - Scoot to Edge of Bed Supine to Sit: 7: Independent Sitting - Scoot to [place] of Bed: 7: Independent Transfers Transfers: Sit to Stand;Stand to Sit Sit to Stand: 7: Independent Stand to Sit: 7: Independent Ambulation/Gait Ambulation/Gait Assistance: 7: Independent Ambulation Distance (Feet): 400 Feet Assistive device: None Ambulation/Gait Assistance Details: steady and WFL.  See DGI Gait Pattern: Within Functional Limits Stairs: Yes Stairs Assistance: 6: Modified independent (Device/Increase time) Stair Management Technique: One rail Right;Alternating pattern;Forwards Number of Stairs: 12  Wheelchair Mobility Wheelchair Mobility: No    Shoulder Instructions     Exercises     PT Diagnosis:    PT Problem List:   PT Treatment Interventions:     PT Goals    Visit Information  Last PT Received On: 03/14/12 Assistance Needed: +1    Subjective Data  Patient Stated Goal: Home independent   Prior Functioning  Home Living Lives With: Other  (Comment) (61 y/o father) Available Help at Discharge: Other (Comment) Type of Home: House Home Access: Level entry Home Layout: One level Bathroom Shower/Tub: Tub/shower unit;Door Dentist: None Prior Function Level of Independence: Independent Able to Take Stairs?: No Driving: Yes Vocation: Retired Musician: No difficulties Dominant Hand: Right    Cognition  Overall Cognitive Status: Appears within functional limits for tasks assessed/performed Arousal/Alertness: Awake/alert Orientation Level: Appears intact for tasks assessed Behavior During Session: [facility] for tasks performed    Extremity/Trunk Assessment Right Upper Extremity Assessment RUE ROM/Strength/Tone: Within functional levels Right Lower Extremity Assessment RLE ROM/Strength/Tone: Within functional levels Left Lower Extremity Assessment LLE ROM/Strength/Tone: Within functional levels Trunk Assessment Trunk Assessment: Normal   Balance Standardized Balance Assessment Standardized Balance Assessment: Dynamic Gait Index Dynamic Gait Index Level Surface: Normal Change in Gait Speed: Normal Gait with Horizontal Head Turns: Normal Gait with Vertical Head Turns: Normal Gait and Pivot Turn: Normal Step Over Obstacle: Normal Step Around Obstacles: Normal Steps: Mild Impairment Total Score: 23   End of Session PT - End of Session Activity Tolerance: Patient tolerated treatment well Patient left: Other (comment) (EOB) Nurse Communication: Mobility status  GP     Daionna Crossland, Eliseo Gum 03/14/2012, 2:06 PM  03/14/2012  [place] Bing, PT [phone] (pager)

## 2012-03-14 NOTE — Discharge Summary (Signed)
Physician Discharge Summary  WEAVER TWEED HYQ:[id number] DOB: [birth year] DOA: 03/09/2012  PCP: Rudi Heap, MD  Admit date: 03/09/2012 Discharge date: 03/14/2012  Time spent: 40  minutes  Recommendations for Outpatient Follow-up:  1. Home Health PT, OT, RN 2. Out Patient Myoview to be scheduled by [place] 3. Follow up with Dr. Shirlee Latch for recent demand ischemia and afib with RVR.  Started on multiple new rate controlling and BP medications. 4. Follow up with PCP regarding recent DKA and changes made to diabetic medications.  Further he was recently started on Eliquis (novel anticoagulant) and his hemoglobin will need to be monitored.  Check bmet in follow up. 5. Orthopedic Follow up with Dr. Victorino Dike in 2 weeks.  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  Diet recommendation: Carb Modified.  Filed Weights   03/11/12 0437 03/12/12 0429 03/14/12 0500  Weight: 79.4 kg (175 lb 0.7 oz) 77.1 kg (169 lb 15.6 oz) 75.206 kg (165 lb 12.8 oz)    History of present illness:  61 year old male patient with known colon cancer status post colectomy and currently receiving chemotherapy. He also has a recent clavicle fracture apparent onset after fall. He presented to the emergency department in frank DKA with a glucose of 1277 and a pH of 7.007. At that time he had altered mentation and was subsequently admitted to the ICU for further monitoring and treatment. Since admission he  had developed elevated troponin levels likely related to demand ischemia. He's had intermittent issues with atrial fibrillation RVR.   Hospital Course:   Diabetic ketoacidosis/Diabetes mellitus type 2, uncontrolled, without complications  The patient had vomiting prior to admission which he believes was due to Vicodin. This vomiting set off his DKA. However, his hemoglobin A1c is 10.4. He needs closer diabetic management. The diabetic coordinators have seen him in consultation and made recommendations for his insulin dosing, increasing his Lantus to 30 units each bedtime. He will be discharged on the new dosage. He will follow up with his primary care physician for ongoing control of his CBGs.  Dehydration, severe  Secondary to severe vomiting and DKA, as well as chemotherapy. This was resolved with IV fluids shortly after admission.  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 Truett Perna.  He completed a final cycle of FOLFOX chemotherapy on 01/05/2012.  He has also been diagnosed with Hereditary non-polyposis cancer syndrome.  At last visit in October plans were to proceed with one-year surveillance CT scan in one year surveillance colonoscopy and to have the Port-A-Cath removed by surgery   NSTEMI (non-ST elevated myocardial infarction)/ Demand ischemia  Peak troponin 1.57 and has subsequently trended down to normal level. Cardiology plans outpatient Myoview study since EF on echocardiogram normal.  Atrial fibrillation with RVR  The patient had difficulty with rate control and required Cardizem infusion at 20 mg per hour. Cardiology had initially planned initiation of heparin and Coumadin and TEE cardioversion on 03/13/2012. Since initial evaluation the patient has  subsequently converted to normal sinus rhythm. Apixaban 5 mg bid was started as well as dronedarone 400 mg bid, and  Toprol 50 mg daily.  He currently remains in normal sinus rhythm.   Hypokalemia Repleted orally.  Fracture of left distal clavicle and scapula fracture/associated left third and fourth rib fractures  Occurred prior to this admission and had sought medical attention with x-ray confirmation of fracture. Dr. Victorino Dike of orthopedic surgery consulted with the patient and his recommendations were as follows: #1 left shoulder glenoid/scapular body fracture continue immobilization in swelling, followup with me in the office in 2 weeks. #2 left distal clavicle fracture appears to be stable as well. Continue immobilization in a sling. Both of these fractures can be treated successfully in a closed fashion. Patient may remove the sling a couple of times a day to flex/extend the elbow) A. and supinate the forearm.  No range of motion of the elbow is recommended. Followup with me in the office in 2 weeks.   Chronic diastolic heart failure, NYHA class 1  Currently compensated, stable during this admission.   Consultations:  Cardiology  Orthopedic  Diabetic Coordinator  Discharge Exam: Filed Vitals:   03/13/12 1305 03/13/12 2100 03/14/12 0500 03/14/12 0938  BP: 128/78 141/83 149/86 168/84  Pulse: 95 96 79 109  Temp: 99 F (37.2 C) 98.1 F (36.7 C) 98.1 F (36.7 C)   TempSrc: Oral Oral Oral   Resp: 16 16 16    Height:      Weight:   75.206 kg (165 lb 12.8 oz)   SpO2: 100% 97% 98%     General: Alert and oriented, no apparent distress, lying comfortably in bed, sling on right a farm Cardiovascular: Slightly tachycardic with a regular rhythm, no murmurs rubs or gallops. Respiratory: Clear to auscultation, no wheezes crackles or rales Abdomen: Soft, nontender, nondistended, with good bowel sounds, no obvious masses. Extremities: Able to move all 4 extremities no edema noted, 1 inch annular abrasion noted on left lower extremity anterior tibial area Neuro: Cranial nerves 2-12 grossly intact,  nonfocal. Psych: Alert and oriented, cooperative, well groomed. Discharge Instructions      Discharge Orders    Future Appointments: Provider: Department: Dept Phone: Center:   04/06/2012 8:00 AM Dava Najjar [facility] MEDICAL ONCOLOGY [phone] None   04/06/2012 9:00 AM [facility] 2 [facility] IM[facility] [phone] [place]   04/09/2012 10:15 AM Rana Snare, NP [facility] MEDICAL ONCOLOGY [phone] None     Future Orders Please Complete By Expires   Diet - low sodium heart healthy      Increase activity slowly          Medication List     As of 03/14/2012 11:53 AM    TAKE these medications         ACCU-CHEK AVIVA PLUS test strip   Generic drug: glucose blood   Daily.      acetaminophen 500 MG tablet   Commonly known as: TYLENOL   Take 1,000 mg by mouth every 6 (six) hours as needed. Pain        apixaban 5 MG Tabs tablet   Commonly known as: ELIQUIS   Take 1 tablet (5 mg total) by mouth 2 (two) times daily.      COMBIGAN 0.2-0.5 % ophthalmic solution   Generic drug: brimonidine-timolol   Place 1 drop into both eyes every 12 (twelve) hours.      dronedarone 400 MG tablet  Commonly known as: MULTAQ   Take 1 tablet (400 mg total) by mouth 2 (two) times daily with a meal.      ECOTRIN PO   Take 81 mg by mouth daily before breakfast. STOP ASA TODAY      ferrous sulfate 325 (65 FE) MG tablet   Take 1 tablet (325 mg total) by mouth 2 (two) times daily.      HYDROcodone-acetaminophen 5-500 MG per tablet   Commonly known as: VICODIN   Take 1 tablet by mouth every 6 (six) hours as needed. For pain      insulin aspart 100 UNIT/ML injection   Commonly known as: novoLOG   Inject 1-15 Units into the skin 3 (three) times daily before meals. Take 1 unit for ever 6 grams of carbs before each meal      insulin glargine 100 UNIT/ML injection   Commonly known as: LANTUS   Inject 30 Units into the skin at  bedtime.      latanoprost 0.005 % ophthalmic solution   Commonly known as: XALATAN   Place 1 drop into both eyes at bedtime.      levocetirizine 5 MG tablet   Commonly known as: XYZAL   Take 5 mg by mouth daily before breakfast.      LISINOPRIL PO   Take 5 mg by mouth daily before breakfast.      lisinopril 10 MG tablet   Commonly known as: PRINIVIL,ZESTRIL   Take 1 tablet (10 mg total) by mouth daily.      metoprolol succinate 50 MG 24 hr tablet   Commonly known as: TOPROL-XL   Take 1 tablet (50 mg total) by mouth daily. Take with or immediately following a meal.      multivitamin tablet   Take 1 tablet by mouth every morning.      SIMVASTATIN PO   Take 10 mg by mouth daily before breakfast.      temazepam 15 MG capsule   Commonly known as: RESTORIL   Take 1 capsule (15 mg total) by mouth at bedtime as needed for sleep.      temazepam 15 MG capsule   Commonly known as: RESTORIL   Take 15 mg by mouth at bedtime as needed. For sleep      traMADol 50 MG tablet   Commonly known as: ULTRAM   Take 1 tablet (50 mg total) by mouth every 6 (six) hours as needed.      triamcinolone 55 MCG/ACT nasal inhaler   Commonly known as: NASACORT   Place 1 spray into the nose every morning. Allergies        VITAMIN C PO   Take 1 tablet by mouth every morning.      VITAMIN D (CHOLECALCIFEROL) PO   Take 2,000 Units by mouth every morning. Vitamin D 3         Follow-up Information    Follow up with HEWITT, Jonny Ruiz, MD. Schedule an appointment as soon as possible for a visit in 2 weeks.   Contact information:   [address], [address] [place] [address] [phone]       Follow up with [name], MD. Schedule an appointment as soon as possible for a visit in 1 week. [facility] will call you to schedule a Cardiac Stress Test.  You will need an appointment with Dr. Shirlee Latch afterwards (within 7 - 10 days))    Contact information:   [address] [place] [address] [phone]       Follow up with Rudi Heap, MD. In 1 week.   Contact information:   [address] [place] [address] [phone]           The results of significant diagnostics from this hospitalization (including imaging, microbiology, ancillary and laboratory) are listed below for reference.    Significant Diagnostic Studies: Dg Clavicle Left  03/12/2012  *RADIOLOGY REPORT*  Clinical Data: Pain and swelling and bruising at the left shoulder. Known distal left clavicle fracture.  LEFT CLAVICLE - 2+ VIEWS  Comparison: Radiographs dated 03/08/2012  Findings: Now apparent is a fracture of the scapula.  The fracture may extend through the glenoid.  I recommend CT scan for further evaluation of the scapula.  There is improved alignment of the distal clavicle fracture.  Power port is noted. There are fractures of the third and fourth ribs.  IMPRESSION: Improved alignment and position of the distal clavicle fracture. Now identified are fractures of the posterior aspects of the left third and fourth ribs as well as of the scapula.  CT scan may be useful for further delineation of the scapular fracture to see if the glenoid is involved.   Original Report Authenticated By: Francene Boyers, M.D.    Ct Shoulder Left Wo Contrast  03/13/2012  *RADIOLOGY REPORT*  Clinical Data: Status post fall with left clavicle and scapular fractures.  CT OF THE LEFT SHOULDER WITHOUT CONTRAST  Technique:  Multidetector CT imaging was performed according to the standard protocol. Multiplanar CT image reconstructions were also generated.  Comparison: Plain films 03/12/2012.  Findings: The patient has a fracture of the distal clavicle which is nondisplaced.  The fracture is 2 cm medial to the [facility] joint and extends obliquely from the posterolateral to inferomedial.  The clavicle is otherwise intact and the sternoclavicular joint is intact.  Also seen is a fracture of the  scapula.  The main component of the fracture is through the scapular body in a transverse orientation with distraction greatest medially where it is approximately 1 cm. The fracture does not extend to the coracoid process or scapular spine.  It does involve the inferior margin of the glenoid bone at and just cephalad to the labrum.  There is minimal distraction of the fracture dislocation of 1-2 mm along the posterior aspect of the glenoid.  Also seen in are nondisplaced fractures of the left third and fourth ribs.  No other fracture is identified.  There is partial visualization of a small right pleural effusion.  Mild atelectatic change is seen in the visualized right lung.  IMPRESSION:  1.  Nondisplaced fracture of the distal clavicle centered 2 cm proximal to the [facility] joint.  The [facility] and sternoclavicular joints are intact. 2.  Transverse fracture predominantly involves the scapular body but does extend into the inferior aspect of the glenoid at approximately the level of the inferior glenoid labrum.  The coracoid process and scapular spine are spared. 3.  Nondisplaced left third and fourth rib fractures. 4.  Partial visualization of a small left pleural effusion.   Original Report Authenticated By: Holley Dexter, M.D.    Dg Chest Port 1 View  03/09/2012  *RADIOLOGY REPORT*  Clinical Data: Shortness of breath, diabetes  PORTABLE CHEST - 1 VIEW  Comparison: 07/12/2011  Findings: Left IJ port catheter extends to the left innominate vein/SVC junction.  Lungs clear.  No definite effusion although the right lateral costophrenic angle is excluded.  Heart size normal. Regional bones unremarkable.  IMPRESSION:  No acute disease   Original Report Authenticated By: D. Andria Rhein, MD    Dg Shoulder Left  03/12/2012  *RADIOLOGY REPORT*  Clinical Data: Pain, swelling, and bruising is.  Clavicle fracture.  LEFT SHOULDER - 2+ VIEW  Comparison: 03/08/2012  Findings: Now apparent is a fracture of the scapula which  appears to extend through the glenoid.  There also are fractures of the posterior aspects of the left third and fourth ribs.  Alignment and position of the clavicle fracture has improved.  IMPRESSION: Fractures of the scapula, distal clavicle, and left third and fourth ribs.  Improved alignment and position of the clavicle fracture.   Original Report Authenticated By: Francene Boyers, M.D.     Microbiology: Recent Results (from the past 240 hour(s))  URINE CULTURE     Status: Normal   Collection Time   03/09/12 10:19 PM      Component Value Range Status Comment   Specimen Description URINE, CATHETERIZED   Final    Special Requests NONE   Final    Culture  Setup Time 03/09/2012 22:52   Final    Colony Count NO GROWTH   Final    Culture NO GROWTH   Final    Report Status 03/11/2012 FINAL   Final   URINE CULTURE     Status: Normal   Collection Time   03/10/12  1:52 AM      Component Value Range Status Comment   Specimen Description URINE, CATHETERIZED   Final    Special Requests Normal   Final    Culture  Setup Time 03/10/2012 14:48   Final    Colony Count NO GROWTH   Final    Culture NO GROWTH   Final    Report Status 03/11/2012 FINAL   Final   MRSA PCR SCREENING     Status: Normal   Collection Time   03/10/12  2:51 AM      Component Value Range Status Comment   MRSA by PCR NEGATIVE  NEGATIVE Final      Labs: Basic Metabolic Panel:  Lab 03/14/12 1610 03/13/12 0555 03/12/12 0500 03/11/12 1700 03/11/12 0700 03/10/12 0533  NA 135 136 138 136 136 --  K 3.7 3.3* 4.0 2.8* 3.7 --  CL 100 99 97 98 100 --  CO2 27 31 27 27 26  --  GLUCOSE 277* 254* 432* 285* 167* --  BUN 14 16 20  25* 32* --  CREATININE 0.88 1.03 1.01 1.14 1.47* --  CALCIUM 9.0 8.6 8.7 8.1* 8.0* --  MG -- 1.9 2.2 -- -- 2.1  PHOS -- -- 1.8* -- -- 3.8   Liver Function Tests:  Lab 03/11/12 0210 03/10/12 0533  LIPASE 87* 156*  AMYLASE -- --   CBC:  Lab 03/14/12 1025 03/13/12 0555 03/12/12 0500 03/11/12 0210  03/10/12 0533 03/09/12 2000  WBC 5.2 5.4 3.9* 3.5* 4.4 --  NEUTROABS -- -- -- -- -- 9.9*  HGB 10.7* 10.2* 10.9* 10.4* 11.0* --  HCT 29.7* 28.8* 30.6* 28.1* 29.6* --  MCV 95.2 93.8 92.4 87.8 90.0 --  PLT 88* 58* 59* 73* 71* --   Cardiac Enzymes:  Lab 03/12/12 0500 03/11/12 1700 03/10/12 1303 03/10/12 0533  CKTOTAL 668* 1040* -- --  CKMB 4.1* 6.3* -- --  CKMBINDEX -- -- -- --  TROPONINI <0.30 0.41* 1.00* 1.57*   CBG:  Lab 03/14/12 1128 03/14/12 0716 03/13/12 2100 03/13/12 1621 03/13/12 1231  GLUCAP  209* 158* 154* 181* 161*       Signed:  Conley Canal  [facility] [phone] 03/14/2012, 11:53 AM

## 2012-03-14 NOTE — Discharge Summary (Signed)
Patient seen and examined by me.  Agree with d/c and close follow up with labs and vital sign monitoring.  CBC, BMP in 1-2 weeks.  Needs to follow up with ortho as well.  Patient initially declined H/H services.  Marlin Canary DO

## 2012-03-16 ENCOUNTER — Telehealth: Payer: Self-pay

## 2012-03-16 NOTE — Telephone Encounter (Signed)
Message copied by Migdalia Dk on Fri Mar 16, 2012  4:07 PM ------      Message from: Lesle Chris      Created: Fri Mar 16, 2012 11:52 AM       See below.            Thanks,       Hoover Brunette, LPN       [employer]             ----- Message -----         From: Laurey Morale, MD         Sent: 03/14/2012   5:35 PM           To: Jacqlyn Krauss, RN, Lch Triage            Apixaban is too expensive.  He needs to start coumadin instead with INR checks at [facility].  Will need talk to Dr. Kathi Der office there to coordinate.  Needs to go ahead and start it.

## 2012-03-16 NOTE — Telephone Encounter (Signed)
Pt states he went to pharmacy on the way home from the hospital and none of the pharmacies locally in [place] had Eliquis. Pt states he was told he had maxed out rx benefits for the year and out of pocket charge for one of his meds was $450, but this was not the Eliquis rx.  Pt states this rx is cheaper than that and he is awaiting a call from the pharmacy to pick up once they get drug in.  Pt does not wish to go on Coumadin, given the monthly monitoring and diet restrictions. Pt has [employer], left copay assistance card and 30 day free trial card at front desk for pt to pick up today and try to see if helps with affordability.  Pt is going to call back if he cannot afford in the future, pt aware he can switch to Coumadin.

## 2012-03-19 ENCOUNTER — Telehealth: Payer: Self-pay | Admitting: *Deleted

## 2012-03-19 NOTE — Telephone Encounter (Signed)
Laurey Morale, MD     Laurey Morale, MD     Sent: [facility] March 14, 2012  5:35 PM      Greg Robertson    MRN: [id number] DOB: 13-Apr-1951    Pt Work: [phone] Pt Home: [phone]           Message     Apixaban is too expensive.  He needs to start coumadin instead with INR checks at [facility].  Will need talk to Dr. Kathi Der office there to coordinate.  Needs to go ahead and start it.

## 2012-03-28 ENCOUNTER — Telehealth (INDEPENDENT_AMBULATORY_CARE_PROVIDER_SITE_OTHER): Payer: Self-pay | Admitting: General Surgery

## 2012-03-28 NOTE — Telephone Encounter (Signed)
Pt of Dr. Michaell Cowing for [facility] removal on 04/03/12.  Pt has fallen and fractured his clavicle on the same side as the PAC, gone into ketoacidosis with severe electrolyte imbalance, developed atrial fib (now resolved) but is on Eliquis (anticoagulant.)  His cardiologist is Dr. [name] [facility])  This pt does not want to delay his PAC removal if at all possible.  Please advise if he now needs cardiac clearance, when to stop his Eliquis, etc.  All records of this in Epic.

## 2012-03-29 NOTE — Telephone Encounter (Signed)
Clearance request sent to Dr Shirlee Latch.

## 2012-03-29 NOTE — Telephone Encounter (Signed)
Spoke with patient's dad and made him aware to have patient call me.

## 2012-03-29 NOTE — Telephone Encounter (Signed)
Thanks for the quick feedback.  Hold Eliquis 2 days preop & keep it on the schedule.

## 2012-03-29 NOTE — Telephone Encounter (Signed)
Patient made aware to stop Eliquis 2 days prior to surgery.

## 2012-03-29 NOTE — Telephone Encounter (Signed)
I will not proceed with removal of his Port-a-catheter until pt gets cardiac clearance.    It may just require a letter from the Dr. McLean/cardiology, but I need to make sure from his cardiologist if it is safe to hold his anticoagulation perioperatively.  The removal can wait until a safer time unless the cardioloigist thinks that it is OK to hold his Eliquis now & proceed w removal in 5 days.

## 2012-03-29 NOTE — Telephone Encounter (Signed)
May hold prior to removal but give normal creatinine, should have to hold Eliquis more than 2 days prior portacath removal.

## 2012-03-29 NOTE — Telephone Encounter (Signed)
Tried to call patient to make him aware. No answer on home phone and work. Will try again later today.

## 2012-04-03 DIAGNOSIS — Z452 Encounter for adjustment and management of vascular access device: Secondary | ICD-10-CM

## 2012-04-04 ENCOUNTER — Telehealth (INDEPENDENT_AMBULATORY_CARE_PROVIDER_SITE_OTHER): Payer: Self-pay | Admitting: General Surgery

## 2012-04-04 NOTE — Telephone Encounter (Signed)
Called to check on patient who is s/p PAC removal by Dr.Gross on 04/03/12. Patient stated that he is doing very well and I was going to set up a follow up appt but per patient Dr.Gross had told him in the hospital that unless it was not healing right or he starting having problems then he did not need to see him back so I did not schd a follow up appt.Marland KitchenMarland KitchenLesly Rubenstein is also aware and was fine with this

## 2012-04-05 ENCOUNTER — Other Ambulatory Visit: Payer: Self-pay | Admitting: *Deleted

## 2012-04-05 DIAGNOSIS — C182 Malignant neoplasm of ascending colon: Secondary | ICD-10-CM

## 2012-04-06 ENCOUNTER — Other Ambulatory Visit (HOSPITAL_BASED_OUTPATIENT_CLINIC_OR_DEPARTMENT_OTHER): Payer: BC Managed Care – PPO | Admitting: Lab

## 2012-04-06 ENCOUNTER — Ambulatory Visit (HOSPITAL_COMMUNITY)
Admission: RE | Admit: 2012-04-06 | Discharge: 2012-04-06 | Disposition: A | Discharge status: Home / Self Care | Payer: BC Managed Care – PPO | Source: Ambulatory Visit | Attending: Oncology | Admitting: Oncology

## 2012-04-06 DIAGNOSIS — C189 Malignant neoplasm of colon, unspecified: Secondary | ICD-10-CM | POA: Insufficient documentation

## 2012-04-06 DIAGNOSIS — Z9049 Acquired absence of other specified parts of digestive tract: Secondary | ICD-10-CM | POA: Insufficient documentation

## 2012-04-06 DIAGNOSIS — I708 Atherosclerosis of other arteries: Secondary | ICD-10-CM | POA: Insufficient documentation

## 2012-04-06 DIAGNOSIS — R911 Solitary pulmonary nodule: Secondary | ICD-10-CM | POA: Insufficient documentation

## 2012-04-06 DIAGNOSIS — Z9221 Personal history of antineoplastic chemotherapy: Secondary | ICD-10-CM | POA: Insufficient documentation

## 2012-04-06 DIAGNOSIS — I700 Atherosclerosis of aorta: Secondary | ICD-10-CM | POA: Insufficient documentation

## 2012-04-06 DIAGNOSIS — Z4789 Encounter for other orthopedic aftercare: Secondary | ICD-10-CM | POA: Insufficient documentation

## 2012-04-06 DIAGNOSIS — C182 Malignant neoplasm of ascending colon: Secondary | ICD-10-CM

## 2012-04-06 LAB — CBC WITH DIFFERENTIAL/PLATELET
BASO%: 0.8 % (ref 0.0–2.0)
HCT: 39.1 % (ref 38.4–49.9)
MCHC: 33.9 g/dL (ref 32.0–36.0)
MONO#: 0.5 10*3/uL (ref 0.1–0.9)
RBC: 3.91 10*6/uL — ABNORMAL LOW (ref 4.20–5.82)
RDW: 13 % (ref 11.0–14.6)
WBC: 4.5 10*3/uL (ref 4.0–10.3)
lymph#: 1.4 10*3/uL (ref 0.9–3.3)

## 2012-04-06 LAB — CEA: CEA: 2.5 ng/mL (ref 0.0–5.0)

## 2012-04-06 LAB — COMPREHENSIVE METABOLIC PANEL (CC13)
ALT: 27 U/L (ref 0–55)
AST: 32 U/L (ref 5–34)
Albumin: 3.8 g/dL (ref 3.5–5.0)
Alkaline Phosphatase: 146 U/L (ref 40–150)
Calcium: 9.8 mg/dL (ref 8.4–10.4)
Chloride: 104 mEq/L (ref 98–107)
Potassium: 4.2 mEq/L (ref 3.5–5.1)
Sodium: 141 mEq/L (ref 136–145)

## 2012-04-06 IMAGING — CT CT CHEST W/ CM
2 of 5 series · 16 of 46 positions shown, 18 images · IV contrast (OMNIPAQUE)
Comparison: Multiple exams, including [DATE] and [DATE]

CT CHEST

CLINICAL DATA: Colon cancer, status post right colectomy.
Completed chemotherapy.

CT CHEST, ABDOMEN AND PELVIS WITH CONTRAST
TECHNIQUE: Multidetector CT imaging of the chest, abdomen and
pelvis was performed following the standard protocol during bolus
administration of intravenous contrast.
Contrast: 100mL OMNIPAQUE IOHEXOL 300 MG/ML  SOLN

[Series 2: cap with st · axial · 0.78mm/px · z∈[-648,-38]mm · 13 of 140 slices shown, 15 images]
[im 9/140  soft-tissue]
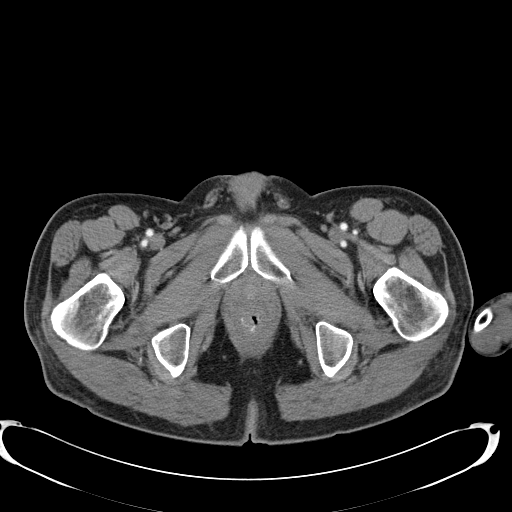
[im 9/140  bone]
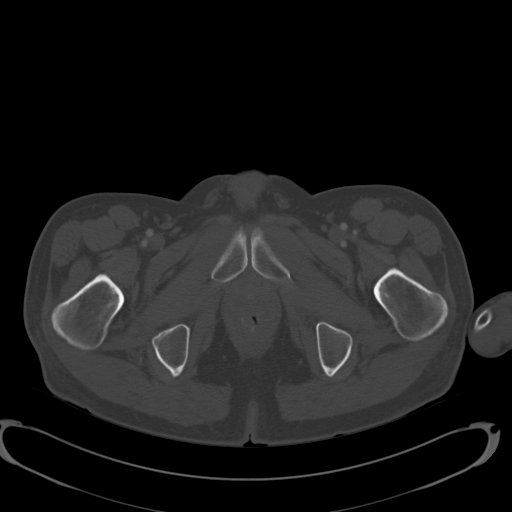
[im 17/140  soft-tissue]
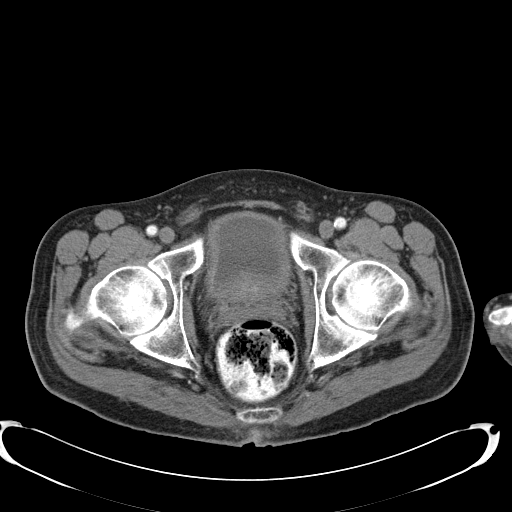
[im 33/140  soft-tissue]
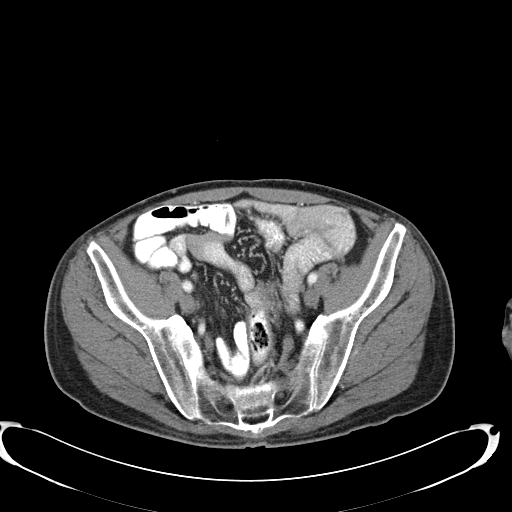
[im 41/140  soft-tissue]
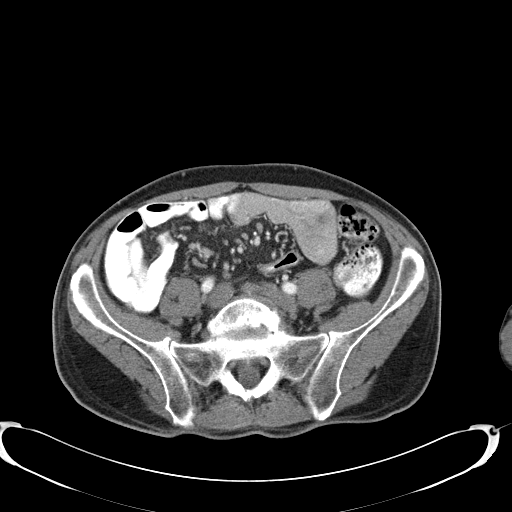
[im 50/140  soft-tissue]
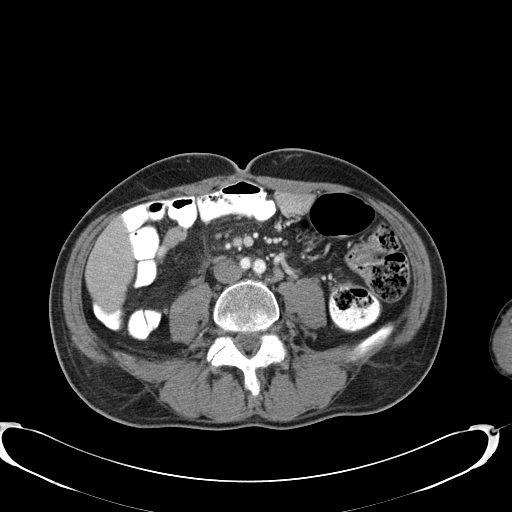
[im 58/140  soft-tissue]
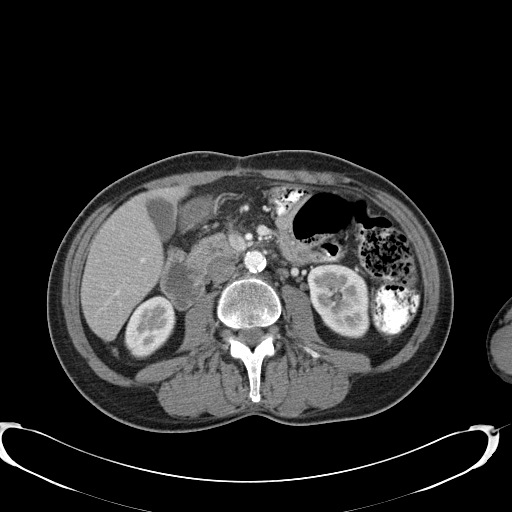
[im 74/140  soft-tissue]
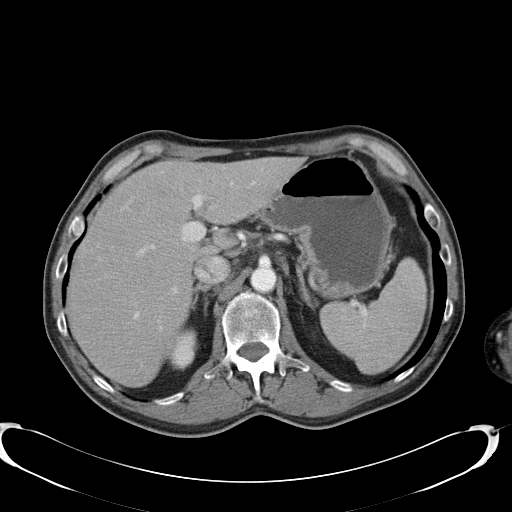
[im 82/140  soft-tissue]
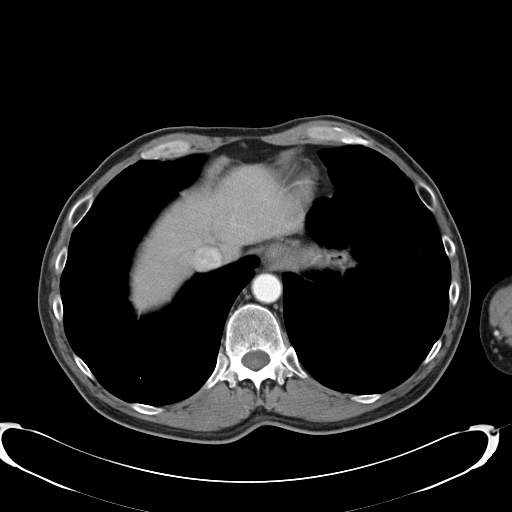
[im 90/140  soft-tissue]
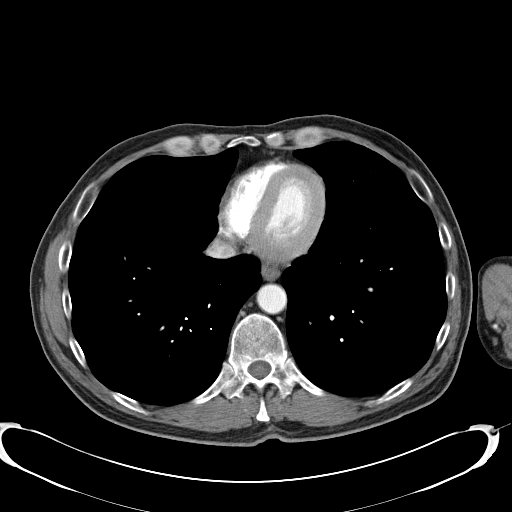
[im 90/140  bone]
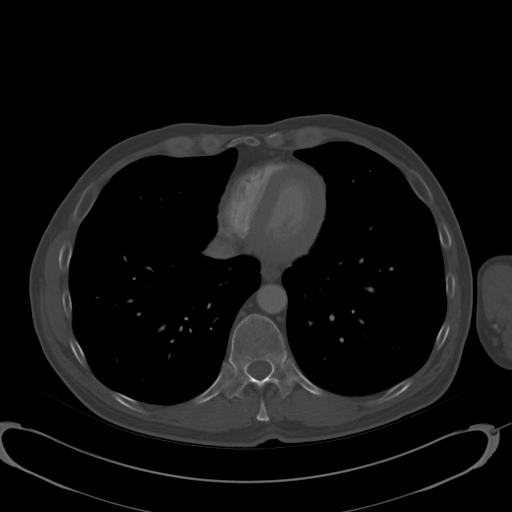
[im 99/140  soft-tissue]
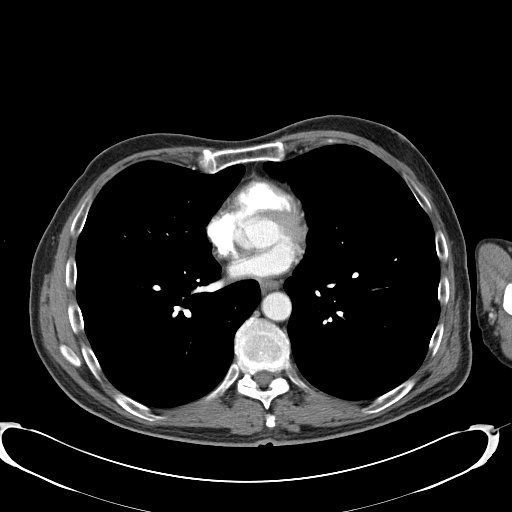
[im 107/140  soft-tissue]
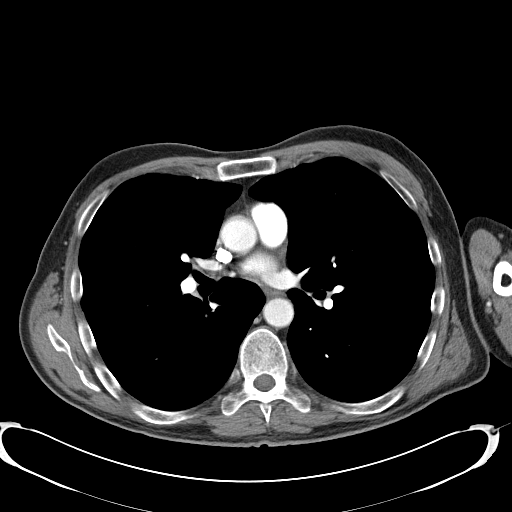
[im 123/140  soft-tissue]
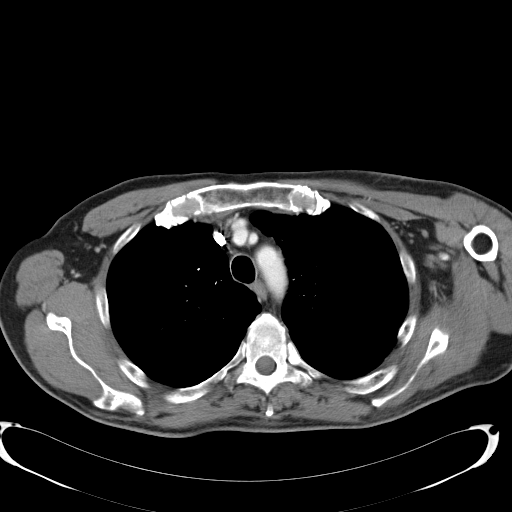
[im 131/140  soft-tissue]
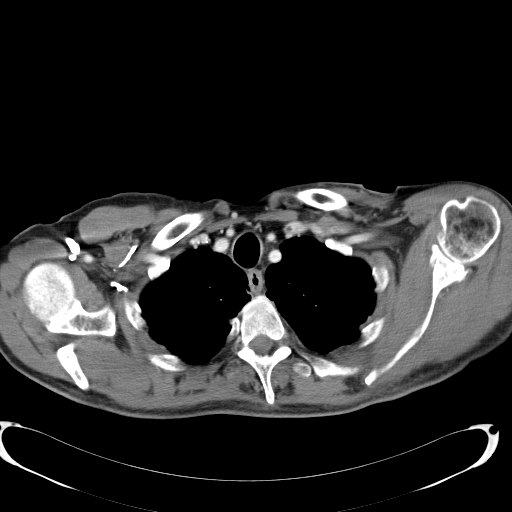

[Series 602: <mpr thick range> · coronal · 1.36mm/px · 3 of 88 slices shown]
[im 30/88  soft-tissue]
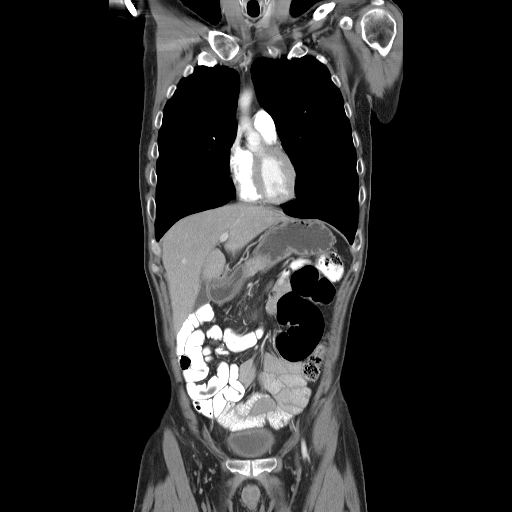
[im 39/88  soft-tissue]
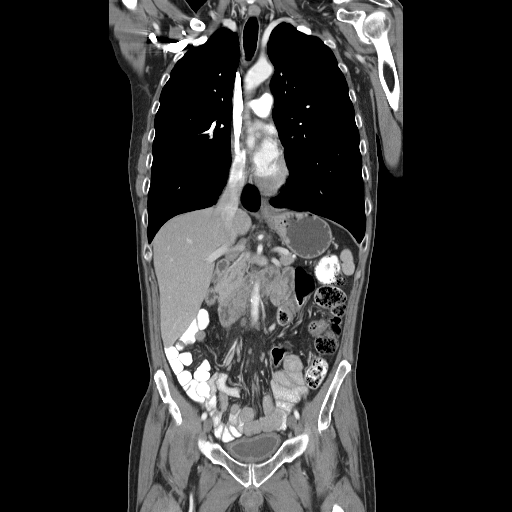
[im 49/88  soft-tissue]
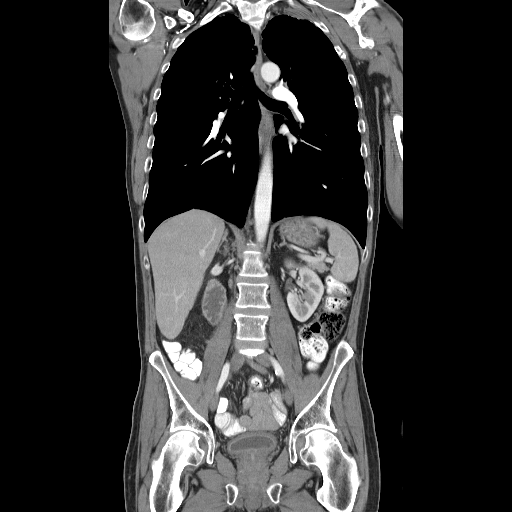

[16 of 46 positions shown; findings below may reference images not displayed]

FINDINGS: Findings related to the patient's known left clavicular,
scapular, and rib fractures noted with evidence of healing
response. This includes a left eleventh rib fracture which is not a
part of the prior left shoulder CT scan.

Small mediastinal nodes are not pathologically enlarged by size
criteria.  Thoracic vascular structures unremarkable.

Stable biapical scarring.  Stable appearance of 5-mm lingular
nodule, subpleural in location.
IMPRESSION: 1.  Healing left clavicular, scapular, and rib fractures.
2.  Stable 5-mm lingular subpleural nodule.  It is statistically
likely that this lesion is benign.  We currently have 11 months of
follow-up of this lesion.  Given the patient's history of
malignancy, continued followup may be warranted.

CT ABDOMEN AND PELVIS
FINDINGS: The liver, spleen, pancreas, and adrenal glands appear
unremarkable.

The gallbladder and biliary system appear unremarkable.

Aortoiliac atherosclerotic calcification noted.  Scattered
periaortic lymph nodes measure up to 8 mm in diameter.  There are
also some scattered small mesenteric lymph nodes which are not
overtly pathologically enlarged.  Small bilateral external iliac
nodes are present.

Right hemicolectomy noted.  No locally recurrent mass is observed.

Mild wall thickening in the urinary bladder noted.  This appears to
be a diffuse process.  Kidneys unremarkable.

Bridging spurring of the sacroiliac joints noted, right greater
than left.  Degenerative endplate findings noted at L5 S1.
IMPRESSION: 1.  Scattered small retroperitoneal, mesenteric, and pelvic lymph
nodes are mildly prominent in number but are not pathologically
enlarged by size criteria.
2.  No locally recurrent disease is observed.  No findings
metastatic disease to the liver.
3.  Urinary bladder wall thickening may partially be due to
nondistension, although cystitis is not excluded.  Correlate with
urine analysis.

## 2012-04-06 MED ORDER — IOHEXOL 300 MG/ML  SOLN
100.0000 mL | Freq: Once | INTRAMUSCULAR | Status: AC | PRN
  Administered 2012-04-06: 100 mL via INTRAVENOUS

## 2012-04-09 ENCOUNTER — Telehealth: Payer: Self-pay | Admitting: Oncology

## 2012-04-09 ENCOUNTER — Ambulatory Visit (HOSPITAL_BASED_OUTPATIENT_CLINIC_OR_DEPARTMENT_OTHER): Payer: BC Managed Care – PPO | Admitting: Nurse Practitioner

## 2012-04-09 VITALS — BP 129/85 | HR 84 | Temp 97.8°F | Resp 18 | Ht 75.0 in | Wt 164.2 lb

## 2012-04-09 DIAGNOSIS — C182 Malignant neoplasm of ascending colon: Secondary | ICD-10-CM

## 2012-04-09 NOTE — Progress Notes (Signed)
OFFICE PROGRESS NOTE  Interval history:  [name] returns as scheduled. He recently tripped over a piece of carpet and broke the left collarbone and shoulder blade. He subsequently developed nausea/vomiting which he relates to the pain medication, became dehydrated with  blood sugars becoming markedly elevated and developed atrial fibrillation. He was hospitalized 03/09/2012 through 03/14/2012.  The left arm continues to be in a sling. Bowels moving regularly. No bloody or black stools. No abdominal pain. No recent nausea/vomiting. He has a good appetite. He denies neuropathy symptoms.   Objective: Blood pressure 129/85, pulse 84, temperature 97.8 F (36.6 C), temperature source Oral, resp. rate 18, height 6\' 3"  (1.905 m), weight 164 lb 3.2 oz (74.481 kg).  Oropharynx is without thrush or ulceration. No palpable cervical, supraclavicular, axillary or inguinal lymph nodes. Lungs are clear. Regular cardiac rhythm. Abdomen is soft and nontender. No organomegaly. Extremities are without edema. The left arm is in a sling.  Lab Results: Lab Results  Component Value Date   WBC 4.5 04/06/2012   HGB 13.3 04/06/2012   HCT 39.1 04/06/2012   MCV 100.0* 04/06/2012   PLT 151 04/06/2012    Chemistry:    Chemistry      Component Value Date/Time   NA 141 04/06/2012 0808   NA 135 03/14/2012 1025   K 4.2 04/06/2012 0808   K 3.7 03/14/2012 1025   CL 104 04/06/2012 0808   CL 100 03/14/2012 1025   CO2 29 04/06/2012 0808   CO2 27 03/14/2012 1025   BUN 12.0 04/06/2012 0808   BUN 14 03/14/2012 1025   CREATININE 1.1 04/06/2012 0808   CREATININE 0.88 03/14/2012 1025      Component Value Date/Time   CALCIUM 9.8 04/06/2012 0808   CALCIUM 9.0 03/14/2012 1025   ALKPHOS 146 04/06/2012 0808   ALKPHOS 130* 12/08/2011 0844   AST 32 04/06/2012 0808   AST 33 12/08/2011 0844   ALT 27 04/06/2012 0808   ALT 30 12/08/2011 0844   BILITOT 0.47 04/06/2012 0808   BILITOT 0.4 12/08/2011 0844        Studies/Results: Dg Clavicle Left  03/12/2012  *RADIOLOGY REPORT*  Clinical Data: Pain and swelling and bruising at the left shoulder. Known distal left clavicle fracture.  LEFT CLAVICLE - 2+ VIEWS  Comparison: Radiographs dated 03/08/2012  Findings: Now apparent is a fracture of the scapula.  The fracture may extend through the glenoid.  I recommend CT scan for further evaluation of the scapula.  There is improved alignment of the distal clavicle fracture.  Power port is noted. There are fractures of the third and fourth ribs.  IMPRESSION: Improved alignment and position of the distal clavicle fracture. Now identified are fractures of the posterior aspects of the left third and fourth ribs as well as of the scapula.  CT scan may be useful for further delineation of the scapular fracture to see if the glenoid is involved.   Original Report Authenticated By: Francene Boyers, M.D.    Ct Chest W Contrast  04/06/2012  *RADIOLOGY REPORT*  Clinical Data:  Colon cancer, status post right colectomy. Completed chemotherapy.  CT CHEST, ABDOMEN AND PELVIS WITH CONTRAST  Technique:  Multidetector CT imaging of the chest, abdomen and pelvis was performed following the standard protocol during bolus administration of intravenous contrast.  Contrast: OMNIPAQUE IOHEXOL 300 MG/ML  SOLN  Comparison:  Multiple exams, including 06/30/2011 and 05/19/2011  CT CHEST  Findings:  Findings related to the patient's known left clavicular, scapular, and  rib fractures noted with evidence of healing response. This includes a left eleventh rib fracture which is not a part of the prior left shoulder CT scan.  Small mediastinal nodes are not pathologically enlarged by size criteria.  Thoracic vascular structures unremarkable.  Stable biapical scarring.  Stable appearance of 5-mm lingular nodule, subpleural in location.  IMPRESSION:  1.  Healing left clavicular, scapular, and rib fractures. 2.  Stable 5-mm lingular subpleural  nodule.  It is statistically likely that this lesion is benign.  We currently have 11 months of follow-up of this lesion.  Given the patient's history of malignancy, continued followup may be warranted.  CT ABDOMEN AND PELVIS  Findings:  The liver, spleen, pancreas, and adrenal glands appear unremarkable.  The gallbladder and biliary system appear unremarkable.  Aortoiliac atherosclerotic calcification noted.  Scattered periaortic lymph nodes measure up to 8 mm in diameter.  There are also some scattered small mesenteric lymph nodes which are not overtly pathologically enlarged.  Small bilateral external iliac nodes are present.  Right hemicolectomy noted.  No locally recurrent mass is observed.  Mild wall thickening in the urinary bladder noted.  This appears to be a diffuse process.  Kidneys unremarkable.  Bridging spurring of the sacroiliac joints noted, right greater than left.  Degenerative endplate findings noted at L5 S1.  IMPRESSION:  1.  Scattered small retroperitoneal, mesenteric, and pelvic lymph nodes are mildly prominent in number but are not pathologically enlarged by size criteria. 2.  No locally recurrent disease is observed.  No findings metastatic disease to the liver. 3.  Urinary bladder wall thickening may partially be due to nondistension, although cystitis is not excluded.  Correlate with urine analysis.   Original Report Authenticated By: Gaylyn Rong, M.D.    Ct Abdomen Pelvis W Contrast  04/06/2012  *RADIOLOGY REPORT*  Clinical Data:  Colon cancer, status post right colectomy. Completed chemotherapy.  CT CHEST, ABDOMEN AND PELVIS WITH CONTRAST  Technique:  Multidetector CT imaging of the chest, abdomen and pelvis was performed following the standard protocol during bolus administration of intravenous contrast.  Contrast: OMNIPAQUE IOHEXOL 300 MG/ML  SOLN  Comparison:  Multiple exams, including 06/30/2011 and 05/19/2011  CT CHEST  Findings:  Findings related to the patient's  known left clavicular, scapular, and rib fractures noted with evidence of healing response. This includes a left eleventh rib fracture which is not a part of the prior left shoulder CT scan.  Small mediastinal nodes are not pathologically enlarged by size criteria.  Thoracic vascular structures unremarkable.  Stable biapical scarring.  Stable appearance of 5-mm lingular nodule, subpleural in location.  IMPRESSION:  1.  Healing left clavicular, scapular, and rib fractures. 2.  Stable 5-mm lingular subpleural nodule.  It is statistically likely that this lesion is benign.  We currently have 11 months of follow-up of this lesion.  Given the patient's history of malignancy, continued followup may be warranted.  CT ABDOMEN AND PELVIS  Findings:  The liver, spleen, pancreas, and adrenal glands appear unremarkable.  The gallbladder and biliary system appear unremarkable.  Aortoiliac atherosclerotic calcification noted.  Scattered periaortic lymph nodes measure up to 8 mm in diameter.  There are also some scattered small mesenteric lymph nodes which are not overtly pathologically enlarged.  Small bilateral external iliac nodes are present.  Right hemicolectomy noted.  No locally recurrent mass is observed.  Mild wall thickening in the urinary bladder noted.  This appears to be a diffuse process.  Kidneys unremarkable.  Bridging  spurring of the sacroiliac joints noted, right greater than left.  Degenerative endplate findings noted at L5 S1.  IMPRESSION:  1.  Scattered small retroperitoneal, mesenteric, and pelvic lymph nodes are mildly prominent in number but are not pathologically enlarged by size criteria. 2.  No locally recurrent disease is observed.  No findings metastatic disease to the liver. 3.  Urinary bladder wall thickening may partially be due to nondistension, although cystitis is not excluded.  Correlate with urine analysis.   Original Report Authenticated By: Gaylyn Rong, M.D.    Ct Shoulder Left Wo  Contrast  03/13/2012  *RADIOLOGY REPORT*  Clinical Data: Status post fall with left clavicle and scapular fractures.  CT OF THE LEFT SHOULDER WITHOUT CONTRAST  Technique:  Multidetector CT imaging was performed according to the standard protocol. Multiplanar CT image reconstructions were also generated.  Comparison: Plain films 03/12/2012.  Findings: The patient has a fracture of the distal clavicle which is nondisplaced.  The fracture is 2 cm medial to the [facility] joint and extends obliquely from the posterolateral to inferomedial.  The clavicle is otherwise intact and the sternoclavicular joint is intact.  Also seen is a fracture of the scapula.  The main component of the fracture is through the scapular body in a transverse orientation with distraction greatest medially where it is approximately 1 cm. The fracture does not extend to the coracoid process or scapular spine.  It does involve the inferior margin of the glenoid bone at and just cephalad to the labrum.  There is minimal distraction of the fracture dislocation of 1-2 mm along the posterior aspect of the glenoid.  Also seen in are nondisplaced fractures of the left third and fourth ribs.  No other fracture is identified.  There is partial visualization of a small right pleural effusion.  Mild atelectatic change is seen in the visualized right lung.  IMPRESSION:  1.  Nondisplaced fracture of the distal clavicle centered 2 cm proximal to the [facility] joint.  The [facility] and sternoclavicular joints are intact. 2.  Transverse fracture predominantly involves the scapular body but does extend into the inferior aspect of the glenoid at approximately the level of the inferior glenoid labrum.  The coracoid process and scapular spine are spared. 3.  Nondisplaced left third and fourth rib fractures. 4.  Partial visualization of a small left pleural effusion.   Original Report Authenticated By: Holley Dexter, M.D.    Dg Shoulder Left  03/12/2012  *RADIOLOGY REPORT*   Clinical Data: Pain, swelling, and bruising is.  Clavicle fracture.  LEFT SHOULDER - 2+ VIEW  Comparison: 03/08/2012  Findings: Now apparent is a fracture of the scapula which appears to extend through the glenoid.  There also are fractures of the posterior aspects of the left third and fourth ribs.  Alignment and position of the clavicle fracture has improved.  IMPRESSION: Fractures of the scapula, distal clavicle, and left third and fourth ribs.  Improved alignment and position of the clavicle fracture.   Original Report Authenticated By: Francene Boyers, M.D.     Medications: I have reviewed the patient's current medications.  Assessment/Plan:  1. 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 Tumor microsatellite unstable, K-ras wild type. Adjuvant FOLFOX chemotherapy initiated on 07/14/2011. Cycle 12 given on 01/05/2012. CT scans chest/abdomen/pelvis 04/06/2012 showed healing left clavicular, scapular and rib fractures; stable 5 mm lingular subpleural nodule; scattered small retroperitoneal, mesenteric and pelvic lymph nodes mildly prominent in  number but not pathologically enlarged; no locally recurrent disease observed; no findings of metastatic disease to the liver. CEA 2.5 on 04/06/2012. 2. Microcytic anemia. Likely secondary to iron deficiency. Resolved. 3. Insulin-dependent diabetes. 4. Glaucoma. 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 He has seen a Runner, broadcasting/film/video. He has been contacted with updated information regarding screening recommendations. 6. Status post Port-A-Cath placement 07/01/2011. The Port-A-Cath has been removed. 7. History of thrombocytopenia secondary to chemotherapy. Oxaliplatin was held with cycle 3. Oxaliplatin was resumed with cycle 4 at a 25%  dose reduction. Oxaliplatin was further dose reduced beginning with cycle 6 due to cytopenias. The oxaliplatin was held with cycle 8, cycle 10 and cycle 11. 8. History of neutropenia secondary to chemotherapy.   Disposition-[name] appears stable from an oncology standpoint. He is scheduled to see Dr. Arlyce Dice in January 2014 for a one-year surveillance colonoscopy. He will return for a followup visit and CEA in 6 months. He will contact the office in the interim with any problems.  Plan reviewed with Dr. Truett Perna.  Greg Robertson ANP/GNP-BC

## 2012-04-09 NOTE — Telephone Encounter (Signed)
appts made and printed for pt  °

## 2012-04-12 ENCOUNTER — Ambulatory Visit: Payer: BC Managed Care – PPO | Attending: Orthopedic Surgery | Admitting: Physical Therapy

## 2012-04-12 DIAGNOSIS — M25619 Stiffness of unspecified shoulder, not elsewhere classified: Secondary | ICD-10-CM | POA: Insufficient documentation

## 2012-04-12 DIAGNOSIS — IMO0001 Reserved for inherently not codable concepts without codable children: Secondary | ICD-10-CM | POA: Insufficient documentation

## 2012-04-12 DIAGNOSIS — R5381 Other malaise: Secondary | ICD-10-CM | POA: Insufficient documentation

## 2012-04-12 DIAGNOSIS — M25519 Pain in unspecified shoulder: Secondary | ICD-10-CM | POA: Insufficient documentation

## 2012-04-17 ENCOUNTER — Ambulatory Visit: Payer: BC Managed Care – PPO | Admitting: Physical Therapy

## 2012-04-23 ENCOUNTER — Ambulatory Visit: Payer: BC Managed Care – PPO | Attending: Orthopedic Surgery | Admitting: Physical Therapy

## 2012-04-23 DIAGNOSIS — M25519 Pain in unspecified shoulder: Secondary | ICD-10-CM | POA: Insufficient documentation

## 2012-04-23 DIAGNOSIS — R5381 Other malaise: Secondary | ICD-10-CM | POA: Insufficient documentation

## 2012-04-23 DIAGNOSIS — IMO0001 Reserved for inherently not codable concepts without codable children: Secondary | ICD-10-CM | POA: Insufficient documentation

## 2012-04-23 DIAGNOSIS — M25619 Stiffness of unspecified shoulder, not elsewhere classified: Secondary | ICD-10-CM | POA: Insufficient documentation

## 2012-04-24 ENCOUNTER — Encounter: Payer: Self-pay | Admitting: Cardiology

## 2012-04-24 ENCOUNTER — Ambulatory Visit (INDEPENDENT_AMBULATORY_CARE_PROVIDER_SITE_OTHER): Payer: BC Managed Care – PPO | Admitting: Cardiology

## 2012-04-24 VITALS — BP 136/76 | HR 71 | Ht 75.0 in | Wt 167.0 lb

## 2012-04-24 DIAGNOSIS — R7989 Other specified abnormal findings of blood chemistry: Secondary | ICD-10-CM

## 2012-04-24 DIAGNOSIS — I4891 Unspecified atrial fibrillation: Secondary | ICD-10-CM

## 2012-04-24 DIAGNOSIS — I10 Essential (primary) hypertension: Secondary | ICD-10-CM

## 2012-04-24 DIAGNOSIS — I214 Non-ST elevation (NSTEMI) myocardial infarction: Secondary | ICD-10-CM

## 2012-04-24 NOTE — Patient Instructions (Addendum)
Your physician has requested that you have en exercise stress myoview. For further information please visit [email or web address] Please follow instruction sheet, as given.  Your physician wants you to follow-up in: 6 months with Dr Shirlee Latch. (July 2014).  You will receive a reminder letter in the mail two months in advance. If you don't receive a letter, please call our office to schedule the follow-up appointment.

## 2012-04-24 NOTE — Progress Notes (Signed)
Patient ID: Greg Robertson, male   DOB: [birth year], 62 y.o.   MRN: [id number] PCP: Dr. Christell Constant  62 yo with history of HTN, type I diabetes, and paroxysmal atrial fibrillation presents to establish outpatient cardiology followup.  Patient also has had colon cancer with ileocolectomy in 2/13.  In 11/13, he fell and broke his clavicle (tripped).  Soon after this, he was admitted with DKA and profound dehydration/metabolic derangement.  He was noted to be in atrial fibrillation with RVR.  His cardiac enzymes were elevated, troponin peaked at 1.57. He initially went back into NSR but then had recurrent episodes of atrial fibrillation.  He was started on dronedarone, which has held him in NSR.  He was also started on Eliquis due to risk of embolic CVA.    Since discharge, he has done well.  He is doing PT for this shoulder.  No chest pain or exertional dyspnea.  He has not felt tachypalpitations.  Blood glucose seems to be under better control.    ECG: NSR, normal  Labs (12/13): TSH normal, HCT 39.1, LFTs normal, K 4.2, creatinine 1.1  PMH: 1. HTN 2. Type I diabetes 3. Anemia, thrombocytopenia, leukopenia: related to chemotherapy.  4. Clavicular fracture 11/13 due to mechanical fall.  5. 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  FOLFOX chemotherapy 3/13 - 9/13.  7. Allergic rhinitis 8. Paroxysmal atrial fibrillation: First diagnosed in 11/13 during admission for DKA. Echo (11/13): EF 55% with grade I diastolic dysfunction.   SH: Single, lives with father.  Quit smoking in 1995.  Smokes marijuana.    FH: No atrial fibrillation or CAD.   ROS: All systems reviewed and negative except as per HPI.   Current Outpatient Prescriptions  Medication Sig Dispense Refill  . ACCU-CHEK AVIVA PLUS test strip Daily.      Marland Kitchen acetaminophen (TYLENOL) 500 MG tablet Take 1,000 mg by mouth every 6 (six) hours as needed. Pain       . apixaban (ELIQUIS) 5 MG TABS tablet Take 1 tablet (5 mg total) by  mouth 2 (two) times daily.  60 tablet  0  . Ascorbic Acid (VITAMIN C PO) Take 1 tablet by mouth every morning.       . brimonidine-timolol (COMBIGAN) 0.2-0.5 % ophthalmic solution Place 1 drop into both eyes every 12 (twelve) hours.       . dronedarone (MULTAQ) 400 MG tablet Take 1 tablet (400 mg total) by mouth 2 (two) times daily with a meal.  60 tablet  0  . ferrous sulfate 325 (65 FE) MG tablet Take 1 tablet (325 mg total) by mouth 2 (two) times daily.  30 tablet  11  . insulin aspart (NOVOLOG) 100 UNIT/ML injection Inject into the skin 3 (three) times daily before meals. Take 1 unit for ever 6 grams of carbs before each meal      . insulin glargine (LANTUS) 100 UNIT/ML injection Inject 24 Units into the skin at bedtime.      Marland Kitchen latanoprost (XALATAN) 0.005 % ophthalmic solution Place 1 drop into both eyes at bedtime.      Marland Kitchen levocetirizine (XYZAL) 5 MG tablet Take 5 mg by mouth daily before breakfast.       . lisinopril (PRINIVIL,ZESTRIL) 10 MG tablet Take 1 tablet (10 mg total) by mouth daily.  30 tablet  0  . Multiple Vitamin (MULTIVITAMIN) tablet Take 1 tablet by mouth every morning.       Marland Kitchen SIMVASTATIN PO Take 10 mg by  mouth daily before breakfast.       . triamcinolone (NASACORT) 55 MCG/ACT nasal inhaler Place 1 spray into the nose every morning. Allergies       . VITAMIN D, CHOLECALCIFEROL, PO Take 2,000 Units by mouth every morning. Vitamin D 3      . metoprolol succinate (TOPROL-XL) 50 MG 24 hr tablet Take 1 tablet (50 mg total) by mouth daily. Take with or immediately following a meal.  30 tablet  0    BP 136/76  Pulse 71  Ht 6\' 3"  (1.905 m)  Wt 167 lb (75.751 kg)  BMI 20.87 kg/m2 General: NAD Neck: No JVD, no thyromegaly or thyroid nodule.  Lungs: Clear to auscultation bilaterally with normal respiratory effort. CV: Nondisplaced PMI.  Heart regular S1/S2, no S3/S4, no murmur.  No peripheral edema.  No carotid bruit.  Normal pedal pulses.  Abdomen: Soft, nontender, no  hepatosplenomegaly, no distention.  Skin: Intact without lesions or rashes.  Neurologic: Alert and oriented x 3.  Psych: Normal affect. Extremities: No clubbing or cyanosis.  HEENT: Normal.   Assessment/Plan: 1. Atrial fibrillation:  Paroxysmal.  Multiple episodes in setting of DKA.  No recent symptoms.  CHADSVASC score = 2 (DM, HTN).  Will continue dronedarone for at least a few months, will see how he does off it after that.  Continue Eliquis likely long-term given CVA risk.  Continue Toprol XL.  2. HTN: BP is under good control.  3. Elevated troponin: Elevated troponin and dyspnea in the setting of atrial fibrillation with RVR while in the hospital.  CAD risk includes DM, HTN, and hyperlipidemia.  I will arrange for ETT-Sestamibi for risk stratification.  4. Hyperlipidemia: Continue simvastatin.   [name] 04/24/2012 5:34 PM

## 2012-04-26 ENCOUNTER — Ambulatory Visit: Payer: BC Managed Care – PPO | Admitting: Physical Therapy

## 2012-04-30 ENCOUNTER — Encounter: Payer: Self-pay | Admitting: Gastroenterology

## 2012-04-30 ENCOUNTER — Ambulatory Visit: Payer: BC Managed Care – PPO | Admitting: Physical Therapy

## 2012-05-01 ENCOUNTER — Ambulatory Visit (HOSPITAL_COMMUNITY): Payer: BC Managed Care – PPO | Attending: Cardiology | Admitting: Radiology

## 2012-05-01 VITALS — Ht 75.0 in | Wt 165.0 lb

## 2012-05-01 DIAGNOSIS — R5381 Other malaise: Secondary | ICD-10-CM | POA: Insufficient documentation

## 2012-05-01 DIAGNOSIS — I10 Essential (primary) hypertension: Secondary | ICD-10-CM | POA: Insufficient documentation

## 2012-05-01 DIAGNOSIS — I214 Non-ST elevation (NSTEMI) myocardial infarction: Secondary | ICD-10-CM

## 2012-05-01 DIAGNOSIS — I4949 Other premature depolarization: Secondary | ICD-10-CM

## 2012-05-01 DIAGNOSIS — I4891 Unspecified atrial fibrillation: Secondary | ICD-10-CM

## 2012-05-01 DIAGNOSIS — E119 Type 2 diabetes mellitus without complications: Secondary | ICD-10-CM | POA: Insufficient documentation

## 2012-05-01 DIAGNOSIS — R5383 Other fatigue: Secondary | ICD-10-CM | POA: Insufficient documentation

## 2012-05-01 MED ORDER — TECHNETIUM TC 99M SESTAMIBI GENERIC - CARDIOLITE
11.0000 | Freq: Once | INTRAVENOUS | Status: AC | PRN
  Administered 2012-05-01: 11 via INTRAVENOUS

## 2012-05-01 MED ORDER — TECHNETIUM TC 99M SESTAMIBI GENERIC - CARDIOLITE
33.0000 | Freq: Once | INTRAVENOUS | Status: AC | PRN
  Administered 2012-05-01: 33 via INTRAVENOUS

## 2012-05-01 NOTE — Progress Notes (Addendum)
[facility] SITE 3 NUCLEAR MED [address] [place], [place] [address] [phone]    Cardiology Nuclear Med Study  Greg Robertson is a 62 y.o. male     MRN : [id number]     DOB: June 14, 1950  Procedure Date: 05/01/2012  Nuclear Med Background Indication for Stress Test:  Evaluation for Ischemia History:  11/13- Hospitalized with PAF w/RVR- assoc. dyspnea and increased troponin. 11/13- Echo- EF= 55%; Nl. GXT 6-7 years ago, per pt. Cardiac Risk Factors: History of Smoking, Hypertension, IDDM Type 2 and Lipids   Symptoms:  Fatigue   Nuclear Pre-Procedure Caffeine/Decaff Intake:  None > 12 hrs NPO After: 10:30pm   Lungs:  clear O2 Sat: n/a % on n/a. IV 0.9% NS with Angio Cath:  20g  IV Site: R Antecubital x 1, tolerated well IV Started by:  Greg Hong, RN  Chest Size (in):  40 Cup Size: n/a  Height: 6\' 3"  (1.905 m)  Weight:  165 lb (74.844 kg)  BMI:  Body mass index is 20.62 kg/(m^2). Tech Comments:  Held Toprol x 24 hrs. 1/2 dose Lantus Insulin last night, no insulin today; CBG=135 @ 4:00am per patient. Greg Edwards,RN    Nuclear Med Study 1 or 2 day study: 1 day  Stress Test Type:  Stress  Reading MD: Greg Millers, MD  Order Authorizing Provider:  [name], MD  Resting Radionuclide: Technetium 65m Sestamibi  Resting Radionuclide Dose: 11.0 mCi   Stress Radionuclide:  Technetium 29m Sestamibi  Stress Radionuclide Dose: 33.0 mCi           Stress Protocol Rest HR: 72 Stress HR: 148  Rest BP: 141/79 Stress BP: 216/113  Exercise Time (min): 7.0 METS: 7.00   Predicted Max HR: 159 bpm % Max HR: 93.08 bpm Rate Pressure Product: 84132    Dose of Adenosine (mg):  n/a Dose of Lexiscan: n/a mg  Dose of Atropine (mg): n/a Dose of Dobutamine: n/a mcg/kg/min (at max HR)  Stress Test Technologist: Greg Parsons, RN  Nuclear Technologist:  Greg Robertson, CNMT     Rest Procedure:  Myocardial perfusion imaging was performed at rest 45 minutes  following the intravenous administration of Technetium 36m Sestamibi. Rest ECG: NSR, RAD, PVC.  Stress Procedure:  The patient exercised on the treadmill utilizing the Bruce Protocol for 7.0 minutes. The patient stopped due to leg fatigue and hypertensive response, and denied any chest pain.  Technetium 63m Sestamibi was injected at peak exercise and myocardial perfusion imaging was performed after a brief delay. Stress ECG: No significant ST segment change suggestive of ischemia.  QPS Raw Data Images:  There is interference from nuclear activity from structures below the diaphragm leaving the inferior wall more difficult to evaluate. Stress Images:  There is decreased uptake in the apex. Rest Images:  There is decreased uptake in the apex. Subtraction (SDS):  No evidence of ischemia. Transient Ischemic Dilatation (Normal <1.22):  0.82 Lung/Heart Ratio (Normal <0.45):  0.36  Quantitative Gated Spect Images QGS EDV:  96 ml QGS ESV:  45 ml  Impression Exercise Capacity:  Fair exercise capacity. BP Response:  Hypertensive blood pressure response. Clinical Symptoms:  No chest pain or dyspnea. ECG Impression:  No significant ST segment change suggestive of ischemia; frequent PVCs and occasional couplet. Comparison with Prior Nuclear Study: No previous nuclear study performed  Overall Impression:  Normal stress nuclear study with a small, mild, fixed apical defect consistent with thinning; no ischemia.  LV Ejection Fraction: 53%.  LV Wall Motion:  NL LV Function; NL Wall Motion   Greg Robertson  EF slightly below normal, probably normal perfusion images.  Low risk.  Greg Robertson 05/02/2012 9:40 AM

## 2012-05-02 NOTE — Progress Notes (Signed)
Pt aware of results by phone.  

## 2012-05-03 ENCOUNTER — Ambulatory Visit: Payer: BC Managed Care – PPO | Admitting: Physical Therapy

## 2012-05-07 ENCOUNTER — Ambulatory Visit: Payer: BC Managed Care – PPO | Admitting: Physical Therapy

## 2012-05-09 ENCOUNTER — Encounter: Payer: Self-pay | Admitting: Gastroenterology

## 2012-05-09 ENCOUNTER — Ambulatory Visit: Payer: BC Managed Care – PPO | Admitting: Gastroenterology

## 2012-05-09 ENCOUNTER — Ambulatory Visit (INDEPENDENT_AMBULATORY_CARE_PROVIDER_SITE_OTHER): Payer: BC Managed Care – PPO | Admitting: Gastroenterology

## 2012-05-09 ENCOUNTER — Telehealth: Payer: Self-pay | Admitting: *Deleted

## 2012-05-09 VITALS — BP 112/58 | HR 80 | Ht 75.0 in | Wt 171.6 lb

## 2012-05-09 DIAGNOSIS — C182 Malignant neoplasm of ascending colon: Secondary | ICD-10-CM

## 2012-05-09 NOTE — Telephone Encounter (Signed)
[facility] [address] [place] [address] [phone]  05/09/2012    RE: RUDIE SERMONS DOB: [birth year] MRN: [id number]   Dear [name],   We have scheduled the above patient for an endoscopic procedure. Our records show that he is on anticoagulation therapy.   Please advise as to how long the patient may come off his therapy of ELIQUIS prior to the procedure, which is scheduled for 05/29/2012.  Please fax back/ or route the completed form to Ashok Sawaya at [phone].   Sincerely,  Merri Ray

## 2012-05-09 NOTE — Assessment & Plan Note (Signed)
Plan followup colonoscopy. I will check with his cardiologist whether his anticoagulant can be held.

## 2012-05-09 NOTE — Progress Notes (Signed)
History of Present Illness:  The patient returns for followup colonoscopy. Approximately year ago a right colon cancer was resected. 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 He received adjuvant chemotherapy. He was placed on Eloquis  in November following a bout of atrial fibrillation which spontaneously subsided. He has no GI complaints.    Review of Systems: Pertinent positive and negative review of systems were noted in the above HPI section. All other review of systems were otherwise negative.    Current Medications, Allergies, Past Medical History, Past Surgical History, Family History and Social History were reviewed in [employer] [facility] electronic medical record  Vital signs were reviewed in today's medical record. Physical Exam: General: Well developed , well nourished, no acute distress Skin: anicteric Head: Normocephalic and atraumatic Eyes:  sclerae anicteric, EOMI Ears: Normal auditory acuity Mouth: No deformity or lesions Lungs: Clear throughout to auscultation Heart: Regular rate and rhythm; no murmurs, rubs or bruits Abdomen: Soft, non tender and non distended. No masses, hepatosplenomegaly or hernias noted. Normal Bowel sounds Rectal:deferred Musculoskeletal: Symmetrical with no gross deformities  Pulses:  Normal pulses noted Extremities: No clubbing, cyanosis, edema or deformities noted Neurological: Alert oriented x 4, grossly nonfocal Psychological:  Alert and cooperative. Normal mood and affect

## 2012-05-09 NOTE — Patient Instructions (Addendum)
You have been scheduled for a colonoscopy with propofol. Please follow written instructions given to you at your visit today.  Please pick up your prep kit at the pharmacy within the next 1-3 days. If you use inhalers (even only as needed) or a CPAP machine, please bring them with you on the day of your procedure.  We will get in touch with Dr Jearld Pies about holding your ELIQUIS blood thinner You have been instructed on your diabetic medications

## 2012-05-09 NOTE — Telephone Encounter (Signed)
Should be sufficient to hold Eliquis for 2 days prior to procedure.

## 2012-05-10 ENCOUNTER — Ambulatory Visit: Payer: BC Managed Care – PPO | Admitting: Physical Therapy

## 2012-05-14 ENCOUNTER — Ambulatory Visit: Payer: BC Managed Care – PPO | Admitting: Physical Therapy

## 2012-05-17 ENCOUNTER — Encounter: Payer: BC Managed Care – PPO | Admitting: Physical Therapy

## 2012-05-21 NOTE — Telephone Encounter (Signed)
Patient aware to hold Eliquis 2 days before his procedure.

## 2012-06-01 ENCOUNTER — Encounter: Payer: BC Managed Care – PPO | Admitting: Gastroenterology

## 2012-06-06 ENCOUNTER — Other Ambulatory Visit: Payer: Self-pay | Admitting: Gastroenterology

## 2012-06-06 ENCOUNTER — Encounter: Payer: Self-pay | Admitting: Gastroenterology

## 2012-06-06 ENCOUNTER — Ambulatory Visit (AMBULATORY_SURGERY_CENTER): Payer: BC Managed Care – PPO | Admitting: Gastroenterology

## 2012-06-06 VITALS — BP 149/78 | HR 65 | Resp 18 | Ht 75.0 in | Wt 171.0 lb

## 2012-06-06 DIAGNOSIS — C182 Malignant neoplasm of ascending colon: Secondary | ICD-10-CM

## 2012-06-06 DIAGNOSIS — Z85038 Personal history of other malignant neoplasm of large intestine: Secondary | ICD-10-CM

## 2012-06-06 LAB — GLUCOSE, CAPILLARY: Glucose-Capillary: 216 mg/dL — ABNORMAL HIGH (ref 70–99)

## 2012-06-06 MED ORDER — SODIUM CHLORIDE 0.9 % IV SOLN: 500.0000 mL | INTRAVENOUS | Status: DC

## 2012-06-06 NOTE — Progress Notes (Signed)
Patient did not experience any of the following events: a burn prior to discharge; a fall within the facility; wrong site/side/patient/procedure/implant event; or a hospital transfer or hospital admission upon discharge from the facility. (G8907) Patient did not have preoperative order for IV antibiotic SSI prophylaxis. (G8918)  

## 2012-06-06 NOTE — Progress Notes (Signed)
Lidocaine-40mg IV prior to Propofol InductionPropofol given over incremental dosages 

## 2012-06-06 NOTE — Patient Instructions (Addendum)
YOU HAD AN ENDOSCOPIC PROCEDURE TODAY AT THE [facility]: Refer to the procedure report that was given to you for any specific questions about what was found during the examination.  If the procedure report does not answer your questions, please call your gastroenterologist to clarify.  If you requested that your care partner not be given the details of your procedure findings, then the procedure report has been included in a sealed envelope for you to review at your convenience later.  YOU SHOULD EXPECT: Some feelings of bloating in the abdomen. Passage of more gas than usual.  Walking can help get rid of the air that was put into your GI tract during the procedure and reduce the bloating. If you had a lower endoscopy (such as a colonoscopy or flexible sigmoidoscopy) you may notice spotting of blood in your stool or on the toilet paper. If you underwent a bowel prep for your procedure, then you may not have a normal bowel movement for a few days.  DIET: Your first meal following the procedure should be a light meal and then it is ok to progress to your normal diet.  A half-sandwich or bowl of soup is an example of a good first meal.  Heavy or fried foods are harder to digest and may make you feel nauseous or bloated.  Likewise meals heavy in dairy and vegetables can cause extra gas to form and this can also increase the bloating.  Drink plenty of fluids but you should avoid alcoholic beverages for 24 hours.  ACTIVITY: Your care partner should take you home directly after the procedure.  You should plan to take it easy, moving slowly for the rest of the day.  You can resume normal activity the day after the procedure however you should NOT DRIVE or use heavy machinery for 24 hours (because of the sedation medicines used during the test).    SYMPTOMS TO REPORT IMMEDIATELY: A gastroenterologist can be reached at any hour.  During normal business hours, 8:30 AM to 5:00 PM Monday through Friday,  call [phone].  After hours and on weekends, please call the GI answering service at [phone] who will take a message and have the physician on call contact you.   Following lower endoscopy (colonoscopy or flexible sigmoidoscopy):  Excessive amounts of blood in the stool  Significant tenderness or worsening of abdominal pains  Swelling of the abdomen that is new, acute  Fever of 100F or higher    FOLLOW UP: If any biopsies were taken you will be contacted by phone or by letter within the next 1-3 weeks.  Call your gastroenterologist if you have not heard about the biopsies in 3 weeks.  Our staff will call the home number listed on your records the next business day following your procedure to check on you and address any questions or concerns that you may have at that time regarding the information given to you following your procedure. This is a courtesy call and so if there is no answer at the home number and we have not heard from you through the emergency physician on call, we will assume that you have returned to your regular daily activities without incident.  SIGNATURES/CONFIDENTIALITY: You and/or your care partner have signed paperwork which will be entered into your electronic medical record.  These signatures attest to the fact that that the information above on your After Visit Summary has been reviewed and is understood.  Full responsibility of the confidentiality   of this discharge information lies with you and/or your care-partner.    May restart Eliquis today per order Dr.Kaplan.

## 2012-06-06 NOTE — Op Note (Signed)
[facility] [address] [place], [address]   COLONOSCOPY PROCEDURE REPORT  PATIENT: Greg, Robertson  MR#: [id number] BIRTHDATE: 1950/04/30 , 61  yrs. old GENDER: Male ENDOSCOPIST: Louis Meckel, MD REFERRED XB:JYNWGN Christell Constant, M.D. PROCEDURE DATE:  06/06/2012 PROCEDURE:   Colonoscopy, diagnostic ASA CLASS:   Class II INDICATIONS:High risk patient with personal history of colon cancer and colon resection for carcinoma, 2030. MEDICATIONS: MAC sedation, administered by CRNA and propofol (Diprivan) 150mg  IV  DESCRIPTION OF PROCEDURE:   After the risks benefits and alternatives of the procedure were thoroughly explained, informed consent was obtained.  A digital rectal exam revealed no abnormalities of the rectum.   The LB [id number]  endoscope was introduced through the anus and advanced to the surgical anastomosis. No adverse events experienced.   The quality of the prep was Suprep excellent  The instrument was then slowly withdrawn as the colon was fully examined.      COLON FINDINGS: A normal appearing cecum, ileocecal valve, and appendiceal orifice were identified.  The ascending, hepatic flexure, transverse, splenic flexure, descending, sigmoid colon and rectum appeared unremarkable.  No polyps or cancers were seen. Retroflexed views revealed no abnormalities. The time to anastamosis=3 minutes 43 seconds.  Withdrawal time=5 minutes 30 seconds.  The scope was withdrawn and the procedure completed. COMPLICATIONS: There were no complications.  ENDOSCOPIC IMPRESSION: Normal colon  RECOMMENDATIONS: 1.  Colonoscopy 2.  in 2 years   eSigned:  Louis Meckel, MD 06/06/2012 2:02 PM   cc: Rolm Baptise, MD and Hali Marry, MD

## 2012-06-07 ENCOUNTER — Telehealth: Payer: Self-pay | Admitting: *Deleted

## 2012-06-07 NOTE — Telephone Encounter (Signed)
  Follow up Call-  Call back number 06/06/2012 05/16/2011  Post procedure Call Back phone  # [phone]  Permission to leave phone message Yes Yes     Patient questions:  Do you have a fever, pain , or abdominal swelling? no Pain Score  0 *  Have you tolerated food without any problems? yes  Have you been able to return to your normal activities? yes  Do you have any questions about your discharge instructions: Diet   no Medications  no Follow up visit  no  Do you have questions or concerns about your Care? no  Actions: * If pain score is 4 or above: No action needed, pain <4.

## 2012-10-09 ENCOUNTER — Telehealth: Payer: Self-pay | Admitting: Oncology

## 2012-10-09 ENCOUNTER — Other Ambulatory Visit (HOSPITAL_BASED_OUTPATIENT_CLINIC_OR_DEPARTMENT_OTHER): Payer: BC Managed Care – PPO | Admitting: Lab

## 2012-10-09 ENCOUNTER — Ambulatory Visit (HOSPITAL_BASED_OUTPATIENT_CLINIC_OR_DEPARTMENT_OTHER): Payer: BC Managed Care – PPO | Admitting: Oncology

## 2012-10-09 VITALS — BP 134/74 | HR 86 | Temp 97.8°F | Resp 20 | Ht 75.0 in | Wt 174.8 lb

## 2012-10-09 DIAGNOSIS — C182 Malignant neoplasm of ascending colon: Secondary | ICD-10-CM

## 2012-10-09 LAB — CEA: CEA: 2.9 ng/mL (ref 0.0–5.0)

## 2012-10-09 NOTE — Progress Notes (Signed)
   [facility]    OFFICE PROGRESS NOTE   INTERVAL HISTORY:   He returns as scheduled. He feels well. He has been treated for recurrent "nose "infections by his primary physician. Good appetite. No neuropathy symptoms. He underwent a normal colonoscopy on 06/06/2012.    Objective:  Vital signs in last 24 hours:  Blood pressure 134/74, pulse 86, temperature 97.8 F (36.6 C), temperature source Oral, resp. rate 20, height 6\' 3"  (1.905 m), weight 174 lb 12.8 oz (79.289 kg).    HEENT: Neck without mass Lymphatics: No cervical, supraclavicular, axillary, or inguinal nodes Resp: Lungs clear bilaterally Cardio: Regular rate and rhythm GI: No hepatosplenomegaly, nontender, no mass Vascular: No leg edema   Lab Results:  Lab Results  Component Value Date   WBC 4.5 04/06/2012   HGB 13.3 04/06/2012   HCT 39.1 04/06/2012   MCV 100.0* 04/06/2012   PLT 151 04/06/2012   CEA pending    Medications: I have reviewed the patient's current medications.  Assessment/Plan: 1. 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 Tumor microsatellite unstable, K-ras wild type. Adjuvant FOLFOX chemotherapy initiated on 07/14/2011. Cycle 12 given on 01/05/2012. CT scans chest/abdomen/pelvis 04/06/2012 showed healing left clavicular, scapular and rib fractures; stable 5 mm lingular subpleural nodule; scattered small retroperitoneal, mesenteric and pelvic lymph nodes mildly prominent in number but not pathologically enlarged; no locally recurrent disease observed; no findings of metastatic disease to the liver. CEA 2.5 on 04/06/2012. -Negative surveillance colonoscopy 06/06/2012 2. Microcytic anemia. Likely secondary to iron deficiency. Resolved. 3. Insulin-dependent diabetes. 4. Glaucoma. 5. Hereditary non-polyposis cancer syndrome. He appears to have HNPCC based on the high microsatellite instability and loss  of expression of PMS2. 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 He has seen a Runner, broadcasting/film/video. He has been contacted with updated information regarding screening recommendations. 6. Status post Port-A-Cath placement 07/01/2011. The Port-A-Cath has been removed. 7. History of thrombocytopenia secondary to chemotherapy. Oxaliplatin was held with cycle 3. Oxaliplatin was resumed with cycle 4 at a 25% dose reduction. Oxaliplatin was further dose reduced beginning with cycle 6 due to cytopenias. The oxaliplatin was held with cycle 8, cycle 10 and cycle 11. 8. History of neutropenia secondary to chemotherapy.    Disposition:  He remains in clinical remission from colon cancer. We will followup on the CEA from today. He will return for an office visit and restaging CT evaluation in 6 months.   Thornton Papas, MD  10/09/2012  1:59 PM

## 2012-10-09 NOTE — Telephone Encounter (Signed)
gv and printed appt sched and avs for pt....gv pt barium   °

## 2012-10-12 ENCOUNTER — Telehealth: Payer: Self-pay | Admitting: *Deleted

## 2012-10-12 NOTE — Telephone Encounter (Signed)
Notified of normal CEA. 

## 2012-10-12 NOTE — Telephone Encounter (Signed)
Message copied by Wandalee Ferdinand on Fri Oct 12, 2012  1:30 PM ------      Message from: Thornton Papas B      Created: Wed Oct 10, 2012 10:13 PM       Please call patient, cea is normal ------

## 2012-10-22 ENCOUNTER — Other Ambulatory Visit: Payer: Self-pay

## 2012-10-22 MED ORDER — INSULIN ASPART 100 UNIT/ML ~~LOC~~ SOLN: 3.0000 [IU] | Freq: Three times a day (TID) | SUBCUTANEOUS | Status: DC

## 2012-10-22 NOTE — Telephone Encounter (Signed)
Last blood sugar 07/27/11  DWM

## 2012-10-29 ENCOUNTER — Encounter: Payer: Self-pay | Admitting: Family Medicine

## 2012-10-29 ENCOUNTER — Ambulatory Visit (INDEPENDENT_AMBULATORY_CARE_PROVIDER_SITE_OTHER): Payer: BC Managed Care – PPO | Admitting: Family Medicine

## 2012-10-29 VITALS — BP 142/78 | HR 74 | Temp 97.9°F | Ht 74.5 in | Wt 176.4 lb

## 2012-10-29 DIAGNOSIS — E559 Vitamin D deficiency, unspecified: Secondary | ICD-10-CM

## 2012-10-29 DIAGNOSIS — I10 Essential (primary) hypertension: Secondary | ICD-10-CM

## 2012-10-29 DIAGNOSIS — R5383 Other fatigue: Secondary | ICD-10-CM

## 2012-10-29 DIAGNOSIS — Z1509 Genetic susceptibility to other malignant neoplasm: Secondary | ICD-10-CM

## 2012-10-29 DIAGNOSIS — R5381 Other malaise: Secondary | ICD-10-CM

## 2012-10-29 DIAGNOSIS — N400 Enlarged prostate without lower urinary tract symptoms: Secondary | ICD-10-CM

## 2012-10-29 DIAGNOSIS — E119 Type 2 diabetes mellitus without complications: Secondary | ICD-10-CM

## 2012-10-29 DIAGNOSIS — C182 Malignant neoplasm of ascending colon: Secondary | ICD-10-CM

## 2012-10-29 DIAGNOSIS — E139 Other specified diabetes mellitus without complications: Secondary | ICD-10-CM | POA: Insufficient documentation

## 2012-10-29 DIAGNOSIS — Z800 Family history of malignant neoplasm of digestive organs: Secondary | ICD-10-CM

## 2012-10-29 LAB — POCT CBC
Granulocyte percent: 69.2 %G (ref 37–80)
Hemoglobin: 15.5 g/dL (ref 14.1–18.1)
Lymph, poc: 1.9 (ref 0.6–3.4)
MCH, POC: 33.2 pg — AB (ref 27–31.2)
MPV: 8.3 fL (ref 0–99.8)
POC Granulocyte: 4.5 (ref 2–6.9)
POC LYMPH PERCENT: 28.9 %L (ref 10–50)
Platelet Count, POC: 155 10*3/uL (ref 142–424)
RBC: 4.7 M/uL (ref 4.69–6.13)

## 2012-10-29 LAB — POCT UA - MICROALBUMIN: Microalbumin Ur, POC: NEGATIVE mg/L

## 2012-10-29 LAB — POCT GLYCOSYLATED HEMOGLOBIN (HGB A1C): Hemoglobin A1C: 7.9

## 2012-10-29 NOTE — Progress Notes (Signed)
  Subjective:    Patient ID: Greg Robertson, male    DOB: January 06, 1951, 62 y.o.   MRN: [id number]  HPI Patient comes in today for followup of chronic medical problems which include insulin-dependent diabetes, adenocarcinoma of the right colon, and hypertension. He also sense with a form to be filled out for the [employer].   Review of Systems  Constitutional: Positive for fatigue (improving).  HENT: Positive for postnasal drip (slight due to allergies).   Eyes: Negative.   Respiratory: Negative.   Cardiovascular: Negative.   Gastrointestinal: Negative.   Genitourinary: Negative for dysuria and frequency.  Musculoskeletal: Negative.   Allergic/Immunologic: Positive for environmental allergies (seasonal).  Neurological: Positive for weakness.  Psychiatric/Behavioral: Positive for sleep disturbance (nightly).       Objective:   Physical Exam BP 142/78  Pulse 74  Temp(Src) 97.9 F (36.6 C) (Oral)  Ht 6' 2.5" (1.892 m)  Wt 176 lb 6.4 oz (80.015 kg)  BMI 22.35 kg/m2  The patient appeared well nourished and normally developed, alert and oriented to time and place. Speech, behavior and judgement appear normal. Vital signs as documented.  Head exam is unremarkable. No scleral icterus or pallor noted.  Neck is without jugular venous distension, thyromegally, or carotid bruits. Carotid upstrokes are brisk bilaterally. No cervical adenopathy. Lungs are clear anteriorly and posteriorly to auscultation. Normal respiratory effort. Cardiac exam reveals regular rate and rhythm. First and second heart sounds normal.  No murmurs, rubs or gallops.  Abdominal exam reveals normal bowl sounds, no masses, no organomegaly and no aortic enlargement. No inguinal adenopathy. Extremities are nonedematous and both femoral and pedal pulses are normal. Skin without pallor or jaundice.  Warm and dry, without rash. Neurologic exam reveals normal deep tendon reflexes and normal sensation. 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

## 2012-10-29 NOTE — Addendum Note (Signed)
Addended by: Bearl Mulberry on: 10/29/2012 05:23 PM   Modules accepted: Orders

## 2012-10-29 NOTE — Patient Instructions (Addendum)
Fall prevention efforts should continue Drink plenty of fluids Monitor blood sugars regularly Keep doctor's appointment with cardiologist and with oncology

## 2012-10-29 NOTE — Addendum Note (Signed)
Addended by: Orma Render F on: 10/29/2012 05:29 PM   Modules accepted: Orders

## 2012-10-30 LAB — NMR LIPOPROFILE WITH LIPIDS
Cholesterol, Total: 220 mg/dL — ABNORMAL HIGH (ref <200)
Large HDL-P: 9.8 umol/L (ref >=4.8)
Large VLDL-P: 3 nmol/L — ABNORMAL HIGH (ref <=2.7)
Small LDL Particle Number: 463 nmol/L (ref <=527)
Triglycerides: 179 mg/dL — ABNORMAL HIGH (ref <150)

## 2012-10-30 LAB — THYROID PANEL WITH TSH
T4, Total: 7.4 ug/dL (ref 5.0–12.5)
TSH: 2.589 u[IU]/mL (ref 0.350–4.500)

## 2012-10-30 LAB — BASIC METABOLIC PANEL WITH GFR
BUN: 13 mg/dL (ref 6–23)
CO2: 30 mEq/L (ref 19–32)
Calcium: 10 mg/dL (ref 8.4–10.5)
Chloride: 100 mEq/L (ref 96–112)
Creat: 0.9 mg/dL (ref 0.50–1.35)

## 2012-11-20 ENCOUNTER — Telehealth: Payer: Self-pay | Admitting: *Deleted

## 2012-11-20 NOTE — Telephone Encounter (Signed)
Pt notified of lab results Previous PSA from 05/2011 was 2.64 Pt also has complaints of insomnia and would like RX Please call to advise

## 2012-11-20 NOTE — Telephone Encounter (Signed)
The best tjhing to try initially is Tylenol PM. This is not habit forming or addicting. Just take one at bedtime. If this does not work after a couple weeks get back in touch.

## 2012-11-20 NOTE — Telephone Encounter (Signed)
Message copied by Bearl Mulberry on Tue Nov 20, 2012  1:48 PM ------      Message from: Ernestina Penna      Created: Tue Oct 30, 2012  2:10 PM       Thyroid tests were within normal limit      The PSA was 2.52, we need to check and see what the previous PSA was+++++++++      Electrolytes were good and within normal limits. Blood sugar was elevated at 184. Kidney function tests were good      On advanced lipid testing a total LDL particle number was 1362. This should be less than 1000. The LDL C. was 116. The triglycerides were 179, but he was not fasting. The HDL particle number was excellent .------ we may need to increase the simvastatin but we need to know the milligrams that he is taking at the present time+++++++++      Vitamin D is at the level in the normal range, please increase vitamin D3 by 1000             ------

## 2012-12-05 ENCOUNTER — Other Ambulatory Visit: Payer: Self-pay | Admitting: Family Medicine

## 2013-01-07 ENCOUNTER — Ambulatory Visit (INDEPENDENT_AMBULATORY_CARE_PROVIDER_SITE_OTHER): Payer: BC Managed Care – PPO | Admitting: Psychology

## 2013-01-07 DIAGNOSIS — F411 Generalized anxiety disorder: Secondary | ICD-10-CM

## 2013-01-07 DIAGNOSIS — G4700 Insomnia, unspecified: Secondary | ICD-10-CM

## 2013-01-12 ENCOUNTER — Other Ambulatory Visit: Payer: Self-pay | Admitting: Family Medicine

## 2013-01-21 ENCOUNTER — Ambulatory Visit (INDEPENDENT_AMBULATORY_CARE_PROVIDER_SITE_OTHER): Payer: BC Managed Care – PPO | Admitting: Psychology

## 2013-01-21 DIAGNOSIS — G4700 Insomnia, unspecified: Secondary | ICD-10-CM

## 2013-01-21 DIAGNOSIS — F411 Generalized anxiety disorder: Secondary | ICD-10-CM

## 2013-02-11 ENCOUNTER — Ambulatory Visit (INDEPENDENT_AMBULATORY_CARE_PROVIDER_SITE_OTHER): Payer: BC Managed Care – PPO | Admitting: Psychology

## 2013-02-11 ENCOUNTER — Encounter (HOSPITAL_COMMUNITY): Payer: Self-pay | Admitting: Psychology

## 2013-02-11 DIAGNOSIS — G4700 Insomnia, unspecified: Secondary | ICD-10-CM

## 2013-02-11 DIAGNOSIS — F411 Generalized anxiety disorder: Secondary | ICD-10-CM

## 2013-02-11 NOTE — Progress Notes (Deleted)
Patient stopped sleepling well 6-7 years ago and very little sleep now.  Need to have Dr. Tenny Craw look at that.

## 2013-02-11 NOTE — Progress Notes (Signed)
Patient:  Greg Robertson   DOB: Aug 29, 1950  MR Number: [id number]  Location: [facility] [address] [place] [address] Dept: [phone]  Start: 11 AM End: 12 PM  Provider/Observer:     Hershal Coria PSYD  Chief Complaint:      Chief Complaint  Patient presents with  . Other    Sleep disturbance  . Anxiety  . Agitation    Reason For Service:    The patient reports that he can not sleep and has not slept well for the past 6-7 years. He went through some stressors that year after lossing his job after internal issues at his work that were not his issues. Then this past February he was diagnosed colon cancer and had surgery and chemo in September and has not been back to work since this time. The patient has been more agitated even trying sleeping pill and melatonin. He is walking but has not been able to get better.   Interventions Strategy:  Cognitive behavioral psychotherapeutic interventions  Participation Level:   Active  Participation Quality:  Appropriate      Behavioral Observation:  Well Groomed, Alert, and Appropriate.   Current Psychosocial Factors: The patient reports he is still not sleeping well and has been actively working on the sleep hygiene issues we have been developing. He reports that he has been doing the cold water and the various sleep hygiene techniques that we have talked about. He does have an appointment with Dr. Tenny Craw on 3 November to look at psychotropic interventions.  Content of Session:   Review current symptoms and worked specifically on issues related to sleep hygiene and better coping skills.  Current Status:   The patient reports he is only sleeping a couple of hours at night and has been doing this for some time.  Patient Progress:   Stable  Target Goals:   Target goals include improving overall sleep patterns, reduce anxiety and  agitation.  Last Reviewed:   02/11/2013  Goals Addressed Today:    Today we worked specifically on sleep hygiene issues.  Impression/Diagnosis:  The patient had some major stressors about 6 or 7 years ago when he lost his job and was unemployed for some time. The patient then found a job 3 years ago and along very well with his employer but was diagnosed with colon cancer recently and has had surgery and chemotherapy. He is hoping to return to work when he starts doing better medically and his employer has said that he would take him back. However, he has had a lot of anxiety and stress and has a 67 year history of very poor sleep. His primary complaint has to do with severe sleep deprivation and with many various intervention attempts he continues to have poor sleep and agitation. His family has been very concerned about him and they have reported this to his primary care physician who initiated and coming to our office initially.   Diagnosis:    Axis I: Generalized anxiety disorder  Insomnia

## 2013-02-11 NOTE — Progress Notes (Signed)
Patient:   Greg Robertson   DOB:   [birth year]  MR Number:  [id number]  Location:  BEHAVIORAL [facility] [address] [place] [address] Dept: [phone]           Date of Service:   01/07/2013  Start Time:   3 PM End Time:   4 PM  Provider/Observer:  Hershal Coria PSYD       Billing Code/Service: [phone]  Chief Complaint:     Chief Complaint  Patient presents with  . Agitation  . Other    sleep disturbance    Reason for Service:  The patient reports that he can not sleep and has not slept well for the past 6-7 years.  He went through some stressors that year after lossing his job after internal issues at his work that were not his issues.  Then this past February he was diagnosed colon cancer and had surgery and chemo in September and has not been back to work since this time.  The patient has been more agitated even trying sleeping pill and melatonin.  He is walking but has not been able to get better.  Current Status:  Severe sleep disturbance and anxiety with poor coping skills.  Reliability of Information: From both patient and review of medical records.    Behavioral Observation: NICANDRO PERRAULT  presents as a 62 y.o.-year-old Right Caucasian Male who appeared his stated age. his dress was Appropriate and he was Well Groomed and his manners were Appropriate to the situation.  There were not any physical disabilities noted.  he displayed an appropriate level of cooperation and motivation.    Interactions:    Active   Attention:   within normal limits  Memory:   within normal limits  Visuo-spatial:   within normal limits  Speech (Volume):  normal  Speech:   normal pitch  Thought Process:  Coherent  Though Content:  WNL  Orientation:   person, place, time/date and  situation  Judgment:   Good  Planning:   Good  Affect:    Anxious  Mood:    Anxious  Insight:   Good  Intelligence:   high  Marital Status/Living: The patient reports that he was born in [facility] and grew up in [place], [place].  The patient has a 69 year old brother, a 47 year old sister, And a 24 year old sister. The patient reports that he is single and continues to live with his father who has a number of medical issues and dementia. His mother is deceased. The patient has no children.  Current Employment: The patient is not working at this time do to recent treatment for colon surgery. He does hope to be of return to work once he is doing better medically.  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  Substance Use:  The patient reports that he smokes marijuana before bad almost every night. He reports that he started doing this sometime ago. The patient reports he drinks no more than 2 or 3 beers every week maximum. The patient denies any ongoing issues of difficulty alcohol.  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 He also has special interest in sports.   Medical History:   Past Medical History  Diagnosis Date  . Diabetes mellitus   . Glaucoma   .  Allergic rhinitis   . Prostate nodule   . Colonic mass   . Cancer   . Substance abuse   . Cancer of ascending colon, 7cm 05/25/2011  . Hypertension   . Pneumonia 1979 or 1980  . Anemia         Outpatient Encounter Prescriptions as of 01/07/2013  Medication Sig Dispense Refill  . acetaminophen (TYLENOL) 500 MG tablet Take 1,000 mg by mouth every 6 (six) hours as needed. Pain       . apixaban (ELIQUIS) 5 MG TABS tablet Take 1 tablet (5 mg total) by mouth 2 (two) times daily.  60 tablet  0  . Ascorbic Acid (VITAMIN C PO) Take 1 tablet by mouth every morning.       . brimonidine-timolol  (COMBIGAN) 0.2-0.5 % ophthalmic solution Place 1 drop into both eyes every 12 (twelve) hours.       . dronedarone (MULTAQ) 400 MG tablet Take 1 tablet (400 mg total) by mouth 2 (two) times daily with a meal.  60 tablet  0  . ferrous sulfate 325 (65 FE) MG tablet Take 1 tablet (325 mg total) by mouth 2 (two) times daily.  30 tablet  11  . ferrous sulfate 325 (65 FE) MG tablet Take 325 mg by mouth 2 (two) times daily.      . insulin aspart (NOVOLOG) 100 UNIT/ML injection Inject 3-5 Units into the skin 3 (three) times daily before meals. Take 1 unit for ever 6 grams of carbs before each meal  10 mL  0  . LANTUS 100 UNIT/ML injection INJECT 30 UNITS INTO THE SKIN AT BEDTIME  10 mL  1  . latanoprost (XALATAN) 0.005 % ophthalmic solution Place 1 drop into both eyes at bedtime.      Marland Kitchen levocetirizine (XYZAL) 5 MG tablet Take 5 mg by mouth daily before breakfast.       . lisinopril (PRINIVIL,ZESTRIL) 10 MG tablet Take 1 tablet (10 mg total) by mouth daily.  30 tablet  0  . metoprolol succinate (TOPROL-XL) 50 MG 24 hr tablet Take 1 tablet (50 mg total) by mouth daily. Take with or immediately following a meal.  30 tablet  0  . Multiple Vitamin (MULTIVITAMIN) tablet Take 1 tablet by mouth every morning.       Marland Kitchen SIMVASTATIN PO Take 10 mg by mouth daily before breakfast.       . triamcinolone (NASACORT) 55 MCG/ACT nasal inhaler Place 1 spray into the nose every morning. Allergies       . VITAMIN D, CHOLECALCIFEROL, PO Take 2,000 Units by mouth every morning. Vitamin D 3      . [DISCONTINUED] ACCU-CHEK AVIVA PLUS test strip Daily.       No facility-administered encounter medications on file as of 01/07/2013.         Sexual History:   History  Sexual Activity  . Sexual Activity: Not on file    Abuse/Trauma History: The patient denies any history of abuse or trauma.  Psychiatric History:  The patient denies any prior psychiatric history.  Family Med/Psych History:  Family History  Problem Relation  Age of Onset  . Colon polyps Father   . Lung cancer Father   . Diabetes Father   . Breast cancer Mother   . Pancreatitis Mother     intestinal adhesions    Risk of Suicide/Violence: low the patient denies any suicidal or homicidal ideation although he has been more agitated particularly with poor sleep.  Impression/DX:  The patient had some major stressors about 6 or 7 years ago when he lost his job and was unemployed for some time. The patient then found a job 3 years ago and along very well with his employer but was diagnosed with colon cancer recently and has had surgery and chemotherapy. He is hoping to return to work when he starts doing better medically and his employer has said that he would take him back. However, he has had a lot of anxiety and stress and has a 67 year history of very poor sleep. His primary complaint has to do with severe sleep deprivation and with many various intervention attempts he continues to have poor sleep and agitation. His family has been very concerned about him and they have reported this to his primary care physician who initiated and coming to our office initially.  Disposition/Plan:  We will set the patient up for individual psychotherapeutic interventions particular around anxiety, and adjustment skills and sleep improvement. We will also have him see the psychiatrist to look at psychotropic medications.  Diagnosis:    Axis I:  Generalized anxiety disorder  Insomnia

## 2013-02-11 NOTE — Progress Notes (Signed)
Patient:  Greg Robertson   DOB: 12/01/1950  MR Number: [id number]  Location: BEHAVIORAL [facility] [address] [place] [address] Dept: [phone]  Start: 11 AM End: 12 PM  Provider/Observer:     Hershal Coria PSYD  Chief Complaint:      Chief Complaint  Patient presents with  . Agitation  . Anxiety  . Other    Insomnia    Reason For Service:    The patient reports that he can not sleep and has not slept well for the past 6-7 years. He went through some stressors that year after lossing his job after internal issues at his work that were not his issues. Then this past February he was diagnosed colon cancer and had surgery and chemo in September and has not been back to work since this time. The patient has been more agitated even trying sleeping pill and melatonin. He is walking but has not been able to get better.   Interventions Strategy:  Cognitive behavioral psychotherapeutic interventions  Participation Level:   Active  Participation Quality:  Appropriate      Behavioral Observation:  Well Groomed, Alert, and Appropriate.   Current Psychosocial Factors: The patient reports that he does have times where he has to help out with his father a lot but overall his father has a CNA and is fairly independent of the patient. The patient is frustrated that he has not been able to work more importantly frustrated that he is not sleeping well.  Content of Session:   Review current symptoms and worked specifically on issues related to sleep hygiene and better coping skills.  Current Status:   The patient reports he is only sleeping a couple of hours at night and has been doing this for some time.  Patient Progress:   Stable  Target Goals:   Target goals include improving overall sleep patterns, reduce anxiety and agitation.  Last Reviewed:   01/21/2013  Goals Addressed Today:    Today we  worked specifically on sleep hygiene issues.  Impression/Diagnosis:  The patient had some major stressors about 6 or 7 years ago when he lost his job and was unemployed for some time. The patient then found a job 3 years ago and along very well with his employer but was diagnosed with colon cancer recently and has had surgery and chemotherapy. He is hoping to return to work when he starts doing better medically and his employer has said that he would take him back. However, he has had a lot of anxiety and stress and has a 67 year history of very poor sleep. His primary complaint has to do with severe sleep deprivation and with many various intervention attempts he continues to have poor sleep and agitation. His family has been very concerned about him and they have reported this to his primary care physician who initiated and coming to our office initially.   Diagnosis:    Axis I: Generalized anxiety disorder  Insomnia

## 2013-02-18 ENCOUNTER — Telehealth (HOSPITAL_COMMUNITY): Payer: Self-pay | Admitting: *Deleted

## 2013-02-18 ENCOUNTER — Ambulatory Visit (INDEPENDENT_AMBULATORY_CARE_PROVIDER_SITE_OTHER): Payer: BC Managed Care – PPO | Admitting: Psychiatry

## 2013-02-18 ENCOUNTER — Encounter (HOSPITAL_COMMUNITY): Payer: Self-pay | Admitting: Psychiatry

## 2013-02-18 VITALS — BP 140/70 | Ht 74.0 in | Wt 179.0 lb

## 2013-02-18 DIAGNOSIS — G4700 Insomnia, unspecified: Secondary | ICD-10-CM

## 2013-02-18 DIAGNOSIS — F411 Generalized anxiety disorder: Secondary | ICD-10-CM

## 2013-02-18 MED ORDER — TRAZODONE HCL 100 MG PO TABS: ORAL_TABLET | ORAL | Status: DC

## 2013-02-18 NOTE — Progress Notes (Signed)
Psychiatric Assessment Adult  Patient Identification:  Greg Robertson Date of Evaluation:  02/18/2013 Chief Complaint: "I haven't been able to sleep." History of Chief Complaint:   Chief Complaint  Patient presents with  . Depression  . Fatigue  . Establish Care    HPI this patient is a 62 year old single white male who lives with his father in [place]. He has been out of work for the last couple of years due to the treatment of colon cancer. 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 The patient was originally referred by Dr. Christell Constant of [facility] due to difficulties with sleep and anxiety. He is seeing Sudie Bailey one of our counselors who has referred him to me.  The patient does not have any psychiatric history. He states that about 6 years ago he was laid off from a job working for the [employer] and  [employer]. After that he had difficulty sleeping. He can't initiate sleep and lies in bed time seen in turning. In February 2013 he was having abdominal discomfort and was diagnosed with colon cancer with subsequent chemotherapy and surgery. He finished all the treatment in October of 2013. He's been medically cleared to return to work but he feels like he just doesn't have the energy. He still only sleeping 2 or 3 hours a night and his thoughts race at night. He denies being depressed through the day but is mildly anxious. He's never had thoughts of hurting himself or others and he doesn't have any psychotic symptoms. He does smoke some marijuana occasionally to try to relax but it's no longer helping with sleep.  His family doctor has tried Benadryl and Ambien which were not helpful for sleep. He's never had a sleep study. He's back to eating normally now. His energy is still low. He spends time taking care of his 82 year old father who has dementia and COPD but there is also caretaker coming into the  home to help. He is worried about finances and how he'll afford retirement Review of Systems  Constitutional: Positive for fatigue.  Psychiatric/Behavioral: Positive for sleep disturbance. The patient is nervous/anxious.    Physical Exam not done  Depressive Symptoms: insomnia, fatigue, anxiety,  (Hypo) Manic Symptoms:   Elevated Mood:  No Irritable Mood:  No Grandiosity:  No Distractibility:  No Labiality of Mood:  No Delusions:  No Hallucinations:  No Impulsivity:  No Sexually Inappropriate Behavior:  No Financial Extravagance:  No Flight of Ideas:  No  Anxiety Symptoms: Excessive Worry:  Yes Panic Symptoms:  No Agoraphobia:  No Obsessive Compulsive: No  Symptoms: None, Specific Phobias:  No Social Anxiety:  No  Psychotic Symptoms:  Hallucinations: No None Delusions:  No Paranoia:  No   Ideas of Reference:  No  PTSD Symptoms: Ever had a traumatic exposure:  No Had a traumatic exposure in the last month:  No Re-experiencing: No None Hypervigilance:  No Hyperarousal: No None Avoidance: No None  Traumatic Brain Injury: No   Past Psychiatric History: Diagnosis: None  Hospitalizations: None   Outpatient Care: None   Substance Abuse Care: None   Self-Mutilation: None   Suicidal Attempts: None   Violent Behaviors: None    Past Medical History:   Past Medical History  Diagnosis Date  . Diabetes mellitus   . Glaucoma   . Allergic rhinitis   . Prostate nodule   . Colonic mass   .  Cancer   . Substance abuse   . Cancer of ascending colon, 7cm 05/25/2011  . Hypertension   . Pneumonia 1979 or 1980  . Anemia   . Diabetes mellitus type I    History of Loss of Consciousness:  No Seizure History:  No Cardiac History:  Yes Allergies:   Allergies  Allergen Reactions  . Oxycodone Nausea And Vomiting   Current Medications:  Current Outpatient Prescriptions  Medication Sig Dispense Refill  . ACCU-CHEK AVIVA PLUS test strip USE TO CHECK SUGAR UP TO 4 TIMES  DAILY  100 each  0  . apixaban (ELIQUIS) 5 MG TABS tablet Take 1 tablet (5 mg total) by mouth 2 (two) times daily.  60 tablet  0  . Ascorbic Acid (VITAMIN C PO) Take 1 tablet by mouth every morning.       . brimonidine-timolol (COMBIGAN) 0.2-0.5 % ophthalmic solution Place 1 drop into both eyes every 12 (twelve) hours.       . ferrous sulfate 325 (65 FE) MG tablet Take 325 mg by mouth 2 (two) times daily.      . insulin aspart (NOVOLOG) 100 UNIT/ML injection Inject 3-5 Units into the skin 3 (three) times daily before meals. Take 1 unit for ever 6 grams of carbs before each meal  10 mL  0  . LANTUS 100 UNIT/ML injection INJECT 30 UNITS INTO THE SKIN AT BEDTIME  10 mL  1  . latanoprost (XALATAN) 0.005 % ophthalmic solution Place 1 drop into both eyes at bedtime.      Marland Kitchen levocetirizine (XYZAL) 5 MG tablet Take 5 mg by mouth daily before breakfast.       . lisinopril (PRINIVIL,ZESTRIL) 10 MG tablet Take 1 tablet (10 mg total) by mouth daily.  30 tablet  0  . metoprolol succinate (TOPROL-XL) 50 MG 24 hr tablet Take 1 tablet (50 mg total) by mouth daily. Take with or immediately following a meal.  30 tablet  0  . Multiple Vitamin (MULTIVITAMIN) tablet Take 1 tablet by mouth every morning.       Marland Kitchen SIMVASTATIN PO Take 10 mg by mouth daily before breakfast.       . triamcinolone (NASACORT) 55 MCG/ACT nasal inhaler Place 1 spray into the nose every morning. Allergies       . VITAMIN D, CHOLECALCIFEROL, PO Take 2,000 Units by mouth every morning. Vitamin D 3      . acetaminophen (TYLENOL) 500 MG tablet Take 1,000 mg by mouth every 6 (six) hours as needed. Pain       . ferrous sulfate 325 (65 FE) MG tablet Take 1 tablet (325 mg total) by mouth 2 (two) times daily.  30 tablet  11  . traZODone (DESYREL) 100 MG tablet Take one or two tablets at bedtime  60 tablet  2   No current facility-administered medications for this visit.    Previous Psychotropic Medications:  Medication Dose                           Substance Abuse History in the last 12 months: Substance Age of 1st Use Last Use Amount Specific Type  Nicotine      Alcohol      Cannabis    occasional use    Opiates      Cocaine      Methamphetamines      LSD      Ecstasy      Benzodiazepines  Caffeine      Inhalants      Others:                          Medical Consequences of Substance Abuse: None  Legal Consequences of Substance Abuse: None  Family Consequences of Substance Abuse: None  Blackouts:  No DT's:  No Withdrawal Symptoms:  No None  Social History: Current Place of Residence: [place], [place] Place of Birth: [place] Family Members: Father, one older brother, 2 younger sisters Marital Status:  Single Children:   Sons:   Daughters:  Relationships: Education:  Corporate treasurer Problems/Performance:  Religious Beliefs/Practices: Unknown History of Abuse: none Armed forces technical officer; Orthoptist and Physiological scientist History:  None. Legal History: none Hobbies/Interests: Walking, doing practice accounting problems  Family History:   Family History  Problem Relation Age of Onset  . Colon polyps Father   . Lung cancer Father   . Diabetes Father   . Breast cancer Mother   . Pancreatitis Mother     intestinal adhesions  . Insomnia Mother     Mental Status Examination/Evaluation: Objective:  Appearance: Neat and Well Groomed  Eye Contact::  Good  Speech:  Clear and Coherent  Volume:  Normal  Mood:  Tired   Affect:  Congruent  Thought Process:  Negative  Orientation:  Full (Time, Place, and Person)  Thought Content:  Negative  Suicidal Thoughts:  No  Homicidal Thoughts:  No  Judgement:  Good  Insight:  Good  Psychomotor Activity:  Normal  Akathisia:  No  Handed:  Right  AIMS (if indicated):    Assets:  Communication Skills Desire for Improvement Resilience    Laboratory/X-Ray Psychological Evaluation(s)       Assessment:  Axis I: Generalized Anxiety Disorder,  insomnia  AXIS I Generalized Anxiety Disorder, insomnia   AXIS II Deferred  AXIS III Past Medical History  Diagnosis Date  . Diabetes mellitus   . Glaucoma   . Allergic rhinitis   . Prostate nodule   . Colonic mass   . Cancer   . Substance abuse   . Cancer of ascending colon, 7cm 05/25/2011  . Hypertension   . Pneumonia 1979 or 1980  . Anemia   . Diabetes mellitus type I      AXIS IV other psychosocial or environmental problems  AXIS V 51-60 moderate symptoms   Treatment Plan/Recommendations:  Plan of Care: Medication management   Laboratory:    Psychotherapy: He is seeing Jonny Ruiz Rodenbaugh   Medications: He will start trazodone 100-200 mg each bedtime   Routine PRN Medications:  No  Consultations:   Safety Concerns:    Other:  He will return in four-week spec call if not sleeping better in one to 2 weeks     Diannia Ruder, MD 11/3/201410:44 AM

## 2013-02-19 ENCOUNTER — Other Ambulatory Visit (HOSPITAL_COMMUNITY): Payer: Self-pay | Admitting: Psychiatry

## 2013-02-19 MED ORDER — TRAZODONE HCL 100 MG PO TABS: ORAL_TABLET | ORAL | Status: DC

## 2013-03-04 ENCOUNTER — Encounter (HOSPITAL_COMMUNITY): Payer: Self-pay | Admitting: Psychology

## 2013-03-04 ENCOUNTER — Ambulatory Visit (INDEPENDENT_AMBULATORY_CARE_PROVIDER_SITE_OTHER): Payer: BC Managed Care – PPO | Admitting: Psychology

## 2013-03-04 DIAGNOSIS — G4700 Insomnia, unspecified: Secondary | ICD-10-CM

## 2013-03-04 DIAGNOSIS — F411 Generalized anxiety disorder: Secondary | ICD-10-CM

## 2013-03-04 NOTE — Progress Notes (Signed)
Patient:  Greg Robertson   DOB: 1950/10/23  MR Number: [id number]  Location: BEHAV[facility] [address] [place] [address] Dept: [phone]  Start: 10 AM End: 11 AM  Provider/Observer:     Hershal Coria PSYD  Chief Complaint:      Chief Complaint  Patient presents with  . Anxiety  . Other    insomnia    Reason For Service:    The patient reports that he can not sleep and has not slept well for the past 6-7 years. He went through some stressors that year after lossing his job after internal issues at his work that were not his issues. Then this past February he was diagnosed colon cancer and had surgery and chemo in September and has not been back to work since this time. The patient has been more agitated even trying sleeping pill and melatonin. He is walking but has not been able to get better.   Interventions Strategy:  Cognitive behavioral psychotherapeutic interventions  Participation Level:   Active  Participation Quality:  Appropriate      Behavioral Observation:  Well Groomed, Alert, and Appropriate.   Current Psychosocial Factors: The patient reports that he is continueing walking and getting out of house.  Still working/planning on returning to his old job in near future.  Content of Session:   Review current symptoms and worked specifically on issues related to sleep hygiene and better coping skills.  Current Status:   Patient reports that he slept through the night the past two nights.  This is a first for him for some time.  The patient reports that he is still working on sleep hygine skills etc.  Patient Progress:   Stable  Target Goals:   Target goals include improving overall sleep patterns, reduce anxiety and agitation.  Last Reviewed:   03/04/2013  Goals Addressed Today:    Today we worked specifically on sleep hygiene issues.  Impression/Diagnosis:  The  patient had some major stressors about 6 or 7 years ago when he lost his job and was unemployed for some time. The patient then found a job 3 years ago and along very well with his employer but was diagnosed with colon cancer recently and has had surgery and chemotherapy. He is hoping to return to work when he starts doing better medically and his employer has said that he would take him back. However, he has had a lot of anxiety and stress and has a 67 year history of very poor sleep. His primary complaint has to do with severe sleep deprivation and with many various intervention attempts he continues to have poor sleep and agitation. His family has been very concerned about him and they have reported this to his primary care physician who initiated and coming to our office initially.   Diagnosis:    Axis I: Generalized anxiety disorder  Insomnia

## 2013-03-05 ENCOUNTER — Ambulatory Visit: Payer: BC Managed Care – PPO | Admitting: Family Medicine

## 2013-03-18 ENCOUNTER — Ambulatory Visit (HOSPITAL_COMMUNITY): Payer: Self-pay | Admitting: Psychiatry

## 2013-03-29 ENCOUNTER — Other Ambulatory Visit: Payer: Self-pay | Admitting: *Deleted

## 2013-03-29 MED ORDER — INSULIN ASPART 100 UNIT/ML ~~LOC~~ SOLN: 3.0000 [IU] | Freq: Three times a day (TID) | SUBCUTANEOUS | Status: DC

## 2013-03-29 NOTE — Telephone Encounter (Signed)
Dr Moore's pt. Last seen 07/14, out of med

## 2013-04-01 ENCOUNTER — Ambulatory Visit (HOSPITAL_COMMUNITY): Payer: Self-pay | Admitting: Psychology

## 2013-04-06 ENCOUNTER — Inpatient Hospital Stay (HOSPITAL_COMMUNITY): Payer: BC Managed Care – PPO

## 2013-04-06 ENCOUNTER — Inpatient Hospital Stay (HOSPITAL_COMMUNITY)
Admission: EM | Admit: 2013-04-06 | Discharge: 2013-04-10 | DRG: 638 | Disposition: A | Discharge status: Home / Self Care | Payer: BC Managed Care – PPO | Attending: Internal Medicine | Admitting: Internal Medicine

## 2013-04-06 ENCOUNTER — Encounter (HOSPITAL_COMMUNITY): Payer: Self-pay | Admitting: Emergency Medicine

## 2013-04-06 DIAGNOSIS — Z79899 Other long term (current) drug therapy: Secondary | ICD-10-CM

## 2013-04-06 DIAGNOSIS — H409 Unspecified glaucoma: Secondary | ICD-10-CM

## 2013-04-06 DIAGNOSIS — I129 Hypertensive chronic kidney disease with stage 1 through stage 4 chronic kidney disease, or unspecified chronic kidney disease: Secondary | ICD-10-CM | POA: Diagnosis present

## 2013-04-06 DIAGNOSIS — Z87891 Personal history of nicotine dependence: Secondary | ICD-10-CM

## 2013-04-06 DIAGNOSIS — Z7982 Long term (current) use of aspirin: Secondary | ICD-10-CM

## 2013-04-06 DIAGNOSIS — Z9119 Patient's noncompliance with other medical treatment and regimen: Secondary | ICD-10-CM

## 2013-04-06 DIAGNOSIS — R112 Nausea with vomiting, unspecified: Secondary | ICD-10-CM

## 2013-04-06 DIAGNOSIS — I5032 Chronic diastolic (congestive) heart failure: Secondary | ICD-10-CM

## 2013-04-06 DIAGNOSIS — Z91199 Patient's noncompliance with other medical treatment and regimen due to unspecified reason: Secondary | ICD-10-CM

## 2013-04-06 DIAGNOSIS — I10 Essential (primary) hypertension: Secondary | ICD-10-CM

## 2013-04-06 DIAGNOSIS — I509 Heart failure, unspecified: Secondary | ICD-10-CM | POA: Diagnosis present

## 2013-04-06 DIAGNOSIS — E119 Type 2 diabetes mellitus without complications: Secondary | ICD-10-CM

## 2013-04-06 DIAGNOSIS — E1110 Type 2 diabetes mellitus with ketoacidosis without coma: Secondary | ICD-10-CM

## 2013-04-06 DIAGNOSIS — Z794 Long term (current) use of insulin: Secondary | ICD-10-CM

## 2013-04-06 DIAGNOSIS — E1159 Type 2 diabetes mellitus with other circulatory complications: Secondary | ICD-10-CM | POA: Diagnosis present

## 2013-04-06 DIAGNOSIS — E860 Dehydration: Secondary | ICD-10-CM | POA: Diagnosis present

## 2013-04-06 DIAGNOSIS — E1310 Other specified diabetes mellitus with ketoacidosis without coma: Principal | ICD-10-CM | POA: Diagnosis present

## 2013-04-06 DIAGNOSIS — N058 Unspecified nephritic syndrome with other morphologic changes: Secondary | ICD-10-CM | POA: Diagnosis present

## 2013-04-06 DIAGNOSIS — E1165 Type 2 diabetes mellitus with hyperglycemia: Secondary | ICD-10-CM | POA: Diagnosis present

## 2013-04-06 DIAGNOSIS — Z23 Encounter for immunization: Secondary | ICD-10-CM

## 2013-04-06 DIAGNOSIS — I4891 Unspecified atrial fibrillation: Secondary | ICD-10-CM

## 2013-04-06 DIAGNOSIS — IMO0001 Reserved for inherently not codable concepts without codable children: Secondary | ICD-10-CM

## 2013-04-06 DIAGNOSIS — Z85038 Personal history of other malignant neoplasm of large intestine: Secondary | ICD-10-CM

## 2013-04-06 DIAGNOSIS — N189 Chronic kidney disease, unspecified: Secondary | ICD-10-CM | POA: Diagnosis present

## 2013-04-06 DIAGNOSIS — E1065 Type 1 diabetes mellitus with hyperglycemia: Secondary | ICD-10-CM | POA: Diagnosis present

## 2013-04-06 DIAGNOSIS — E1129 Type 2 diabetes mellitus with other diabetic kidney complication: Secondary | ICD-10-CM | POA: Diagnosis present

## 2013-04-06 DIAGNOSIS — N179 Acute kidney failure, unspecified: Secondary | ICD-10-CM | POA: Diagnosis present

## 2013-04-06 LAB — CBC WITH DIFFERENTIAL/PLATELET
Basophils Absolute: 0 10*3/uL (ref 0.0–0.1)
Eosinophils Relative: 0 % (ref 0–5)
Hemoglobin: 16.5 g/dL (ref 13.0–17.0)
Lymphocytes Relative: 7 % — ABNORMAL LOW (ref 12–46)
Lymphs Abs: 1.1 10*3/uL (ref 0.7–4.0)
MCV: 95.1 fL (ref 78.0–100.0)
Monocytes Relative: 8 % (ref 3–12)
Neutro Abs: 12.8 10*3/uL — ABNORMAL HIGH (ref 1.7–7.7)
Neutrophils Relative %: 85 % — ABNORMAL HIGH (ref 43–77)
Platelets: 162 10*3/uL (ref 150–400)
RBC: 4.93 MIL/uL (ref 4.22–5.81)
WBC: 15.1 10*3/uL — ABNORMAL HIGH (ref 4.0–10.5)

## 2013-04-06 LAB — COMPREHENSIVE METABOLIC PANEL
ALT: 19 U/L (ref 0–53)
AST: 19 U/L (ref 0–37)
Alkaline Phosphatase: 123 U/L — ABNORMAL HIGH (ref 39–117)
CO2: 10 mEq/L — CL (ref 19–32)
Chloride: 83 mEq/L — ABNORMAL LOW (ref 96–112)
GFR calc Af Amer: 58 mL/min — ABNORMAL LOW (ref >90)
GFR calc non Af Amer: 50 mL/min — ABNORMAL LOW (ref >90)
Glucose, Bld: 623 mg/dL (ref 70–99)
Potassium: 5.9 mEq/L — ABNORMAL HIGH (ref 3.5–5.1)
Sodium: 131 mEq/L — ABNORMAL LOW (ref 135–145)

## 2013-04-06 LAB — URINE MICROSCOPIC-ADD ON

## 2013-04-06 LAB — URINALYSIS, ROUTINE W REFLEX MICROSCOPIC
Glucose, UA: >1000 mg/dL — AB
Hgb urine dipstick: NEGATIVE
Ketones, ur: >80 mg/dL — AB
Leukocytes, UA: NEGATIVE
Protein, ur: NEGATIVE mg/dL
Specific Gravity, Urine: 1.024 (ref 1.005–1.030)
Urobilinogen, UA: 0.2 mg/dL (ref 0.0–1.0)
pH: 5 (ref 5.0–8.0)

## 2013-04-06 LAB — GLUCOSE, CAPILLARY

## 2013-04-06 IMAGING — CR DG CHEST 2V
2 series · 2 of 2 positions shown · non-contrast
Comparison: [DATE] and prior chest radiographs

CLINICAL DATA: 62-year-old male with shortness of breath and
hypertension.

EXAM:
CHEST  2 VIEW

[w chest pa]
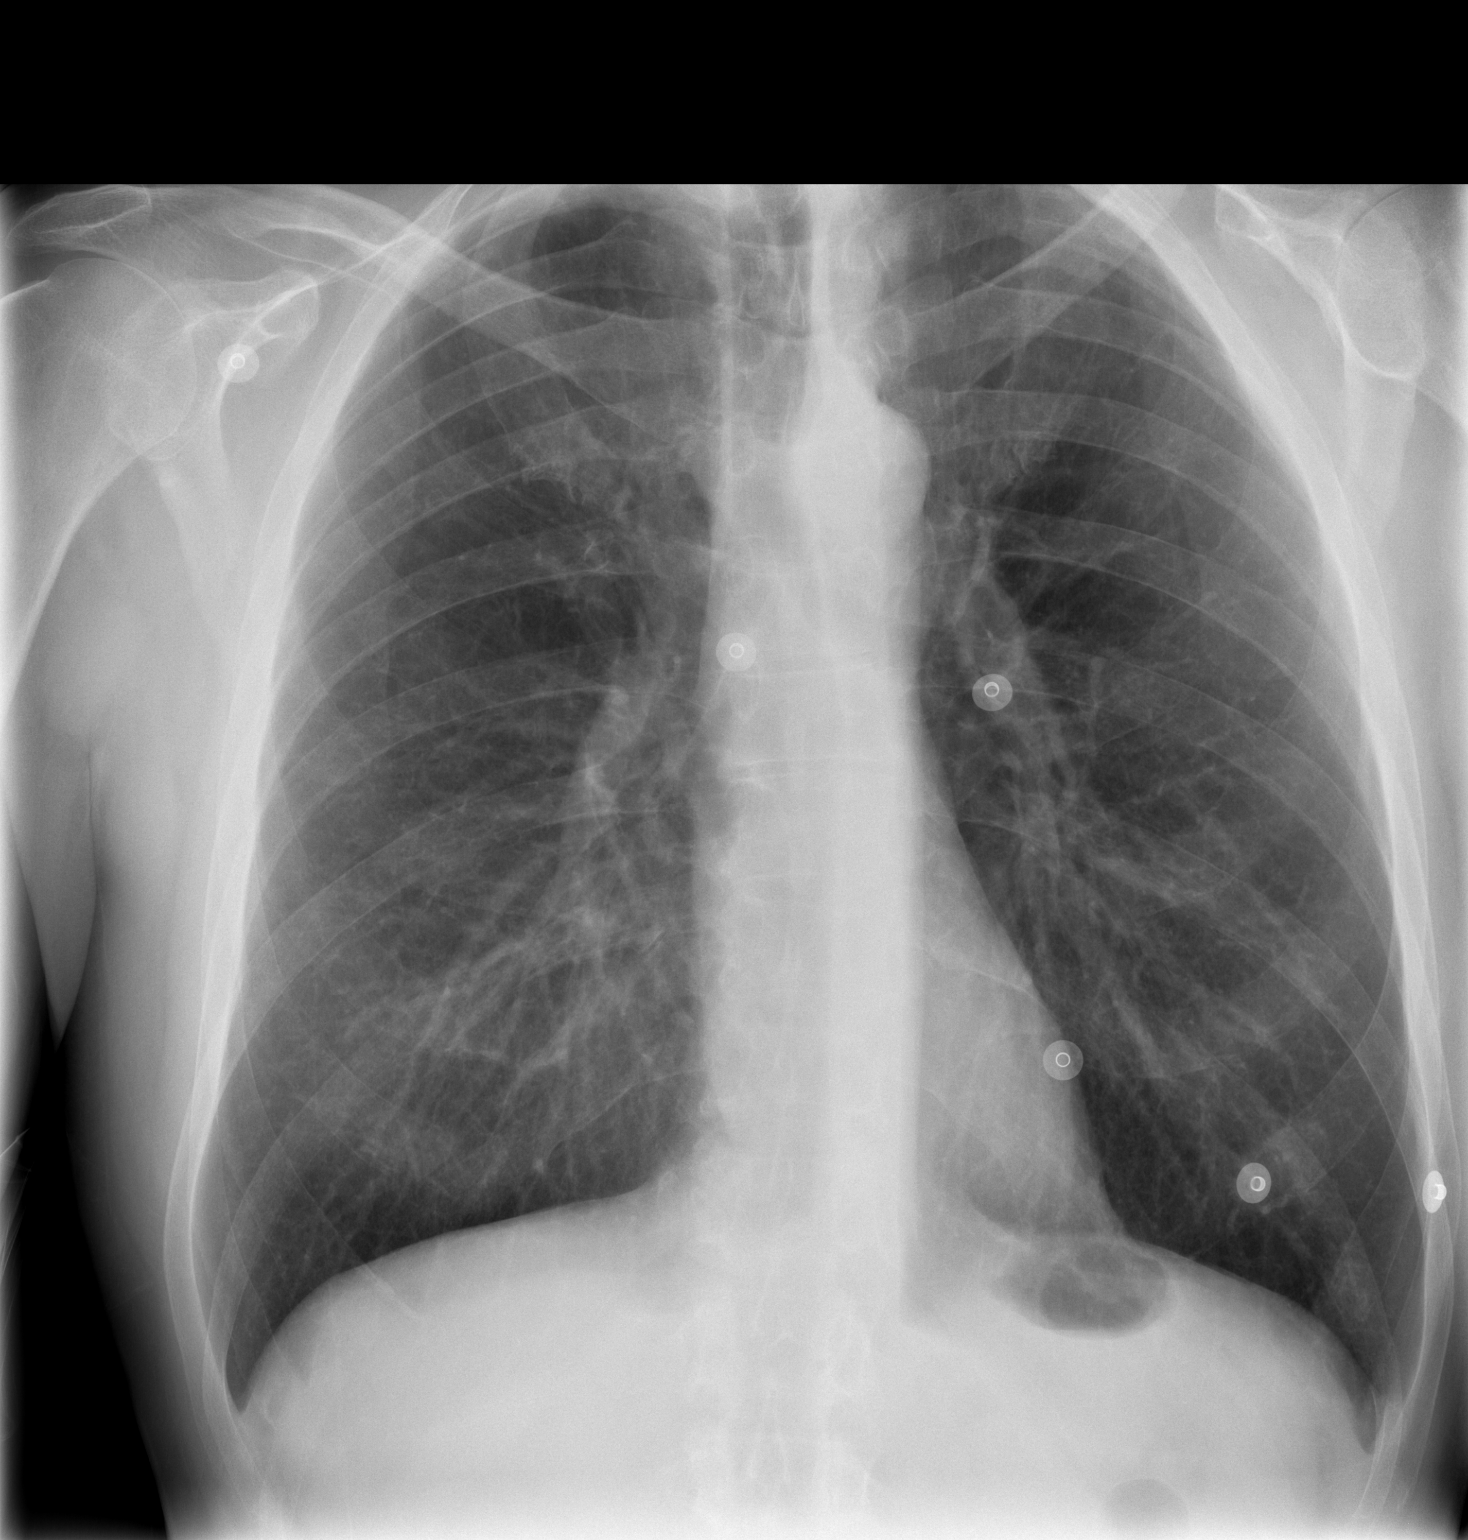

[w chest lat]
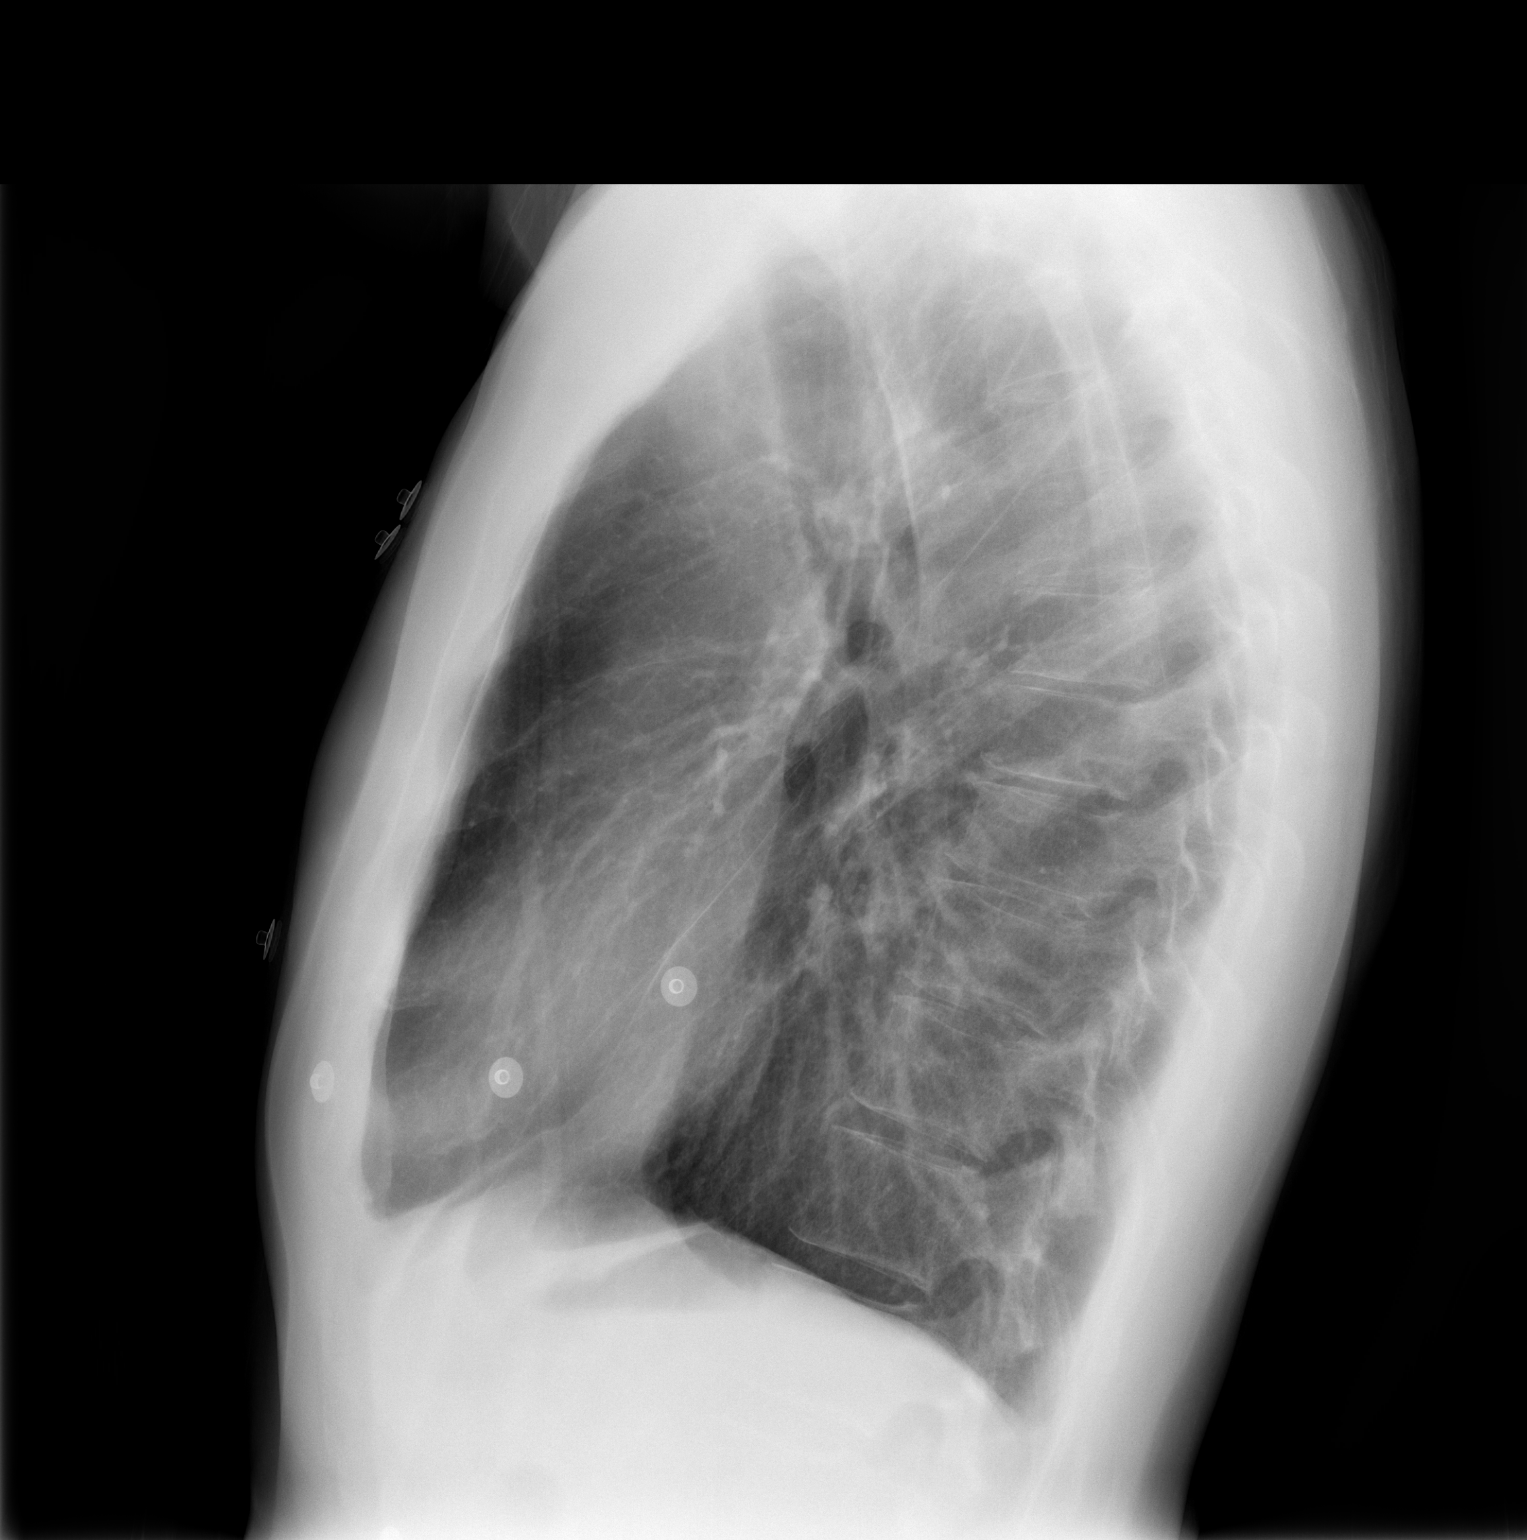

[2 of 2 positions shown; findings below may reference images not displayed]

FINDINGS: The cardiomediastinal silhouette is unremarkable.

COPD/ emphysema identified.

There is no evidence of focal airspace disease, pulmonary edema,
suspicious pulmonary nodule/mass, pleural effusion, or pneumothorax.
No acute bony abnormalities are identified.
IMPRESSION: COPD/ emphysema without evidence of acute cardiopulmonary disease.

## 2013-04-06 MED ORDER — BRIMONIDINE TARTRATE 0.2 % OP SOLN
1.0000 [drp] | Freq: Two times a day (BID) | OPHTHALMIC | Status: DC
  Administered 2013-04-07 – 2013-04-10 (×8): 1 [drp] via OPHTHALMIC
  Filled 2013-04-06 (×2): qty 5

## 2013-04-06 MED ORDER — SODIUM CHLORIDE 0.9 % IV SOLN
INTRAVENOUS | Status: DC
  Administered 2013-04-07: 04:00:00 via INTRAVENOUS
  Administered 2013-04-07: 10.1 [IU]/h via INTRAVENOUS
  Filled 2013-04-06: qty 1

## 2013-04-06 MED ORDER — DEXTROSE-NACL 5-0.45 % IV SOLN
INTRAVENOUS | Status: DC
  Administered 2013-04-07: 04:00:00 via INTRAVENOUS

## 2013-04-06 MED ORDER — DEXTROSE 50 % IV SOLN: 25.0000 mL | INTRAVENOUS | Status: DC | PRN

## 2013-04-06 MED ORDER — PROMETHAZINE HCL 25 MG/ML IJ SOLN
25.0000 mg | Freq: Once | INTRAMUSCULAR | Status: AC
  Administered 2013-04-06: 25 mg via INTRAVENOUS
  Filled 2013-04-06: qty 1

## 2013-04-06 MED ORDER — SODIUM CHLORIDE 0.9 % IV SOLN
INTRAVENOUS | Status: DC
  Administered 2013-04-06: 5 [IU]/h via INTRAVENOUS
  Filled 2013-04-06: qty 1

## 2013-04-06 MED ORDER — SODIUM CHLORIDE 0.9 % IV SOLN
INTRAVENOUS | Status: DC
  Administered 2013-04-06: via INTRAVENOUS

## 2013-04-06 MED ORDER — HYDROMORPHONE HCL PF 1 MG/ML IJ SOLN
0.5000 mg | INTRAMUSCULAR | Status: DC | PRN
  Administered 2013-04-07 – 2013-04-10 (×6): 0.5 mg via INTRAVENOUS
  Filled 2013-04-06 (×6): qty 1

## 2013-04-06 MED ORDER — BRIMONIDINE TARTRATE-TIMOLOL 0.2-0.5 % OP SOLN
1.0000 [drp] | Freq: Two times a day (BID) | OPHTHALMIC | Status: DC
  Filled 2013-04-06: qty 5

## 2013-04-06 MED ORDER — FLUTICASONE PROPIONATE 50 MCG/ACT NA SUSP
2.0000 | Freq: Every day | NASAL | Status: DC
  Administered 2013-04-07 – 2013-04-10 (×4): 2 via NASAL
  Filled 2013-04-06: qty 16

## 2013-04-06 MED ORDER — SODIUM CHLORIDE 0.9 % IV SOLN
1000.0000 mL | INTRAVENOUS | Status: DC
  Administered 2013-04-06: 1000 mL via INTRAVENOUS

## 2013-04-06 MED ORDER — ENOXAPARIN SODIUM 30 MG/0.3ML ~~LOC~~ SOLN
30.0000 mg | SUBCUTANEOUS | Status: DC
  Administered 2013-04-07: 30 mg via SUBCUTANEOUS
  Filled 2013-04-06: qty 0.3

## 2013-04-06 MED ORDER — TIMOLOL MALEATE 0.5 % OP SOLN
1.0000 [drp] | Freq: Two times a day (BID) | OPHTHALMIC | Status: DC
  Administered 2013-04-07 – 2013-04-10 (×8): 1 [drp] via OPHTHALMIC
  Filled 2013-04-06: qty 5

## 2013-04-06 MED ORDER — SODIUM CHLORIDE 0.9 % IV SOLN
1000.0000 mL | Freq: Once | INTRAVENOUS | Status: AC
  Administered 2013-04-06: 1000 mL via INTRAVENOUS

## 2013-04-06 MED ORDER — SODIUM CHLORIDE 0.9 % IV SOLN
INTRAVENOUS | Status: AC
  Administered 2013-04-06: via INTRAVENOUS

## 2013-04-06 MED ORDER — LATANOPROST 0.005 % OP SOLN
1.0000 [drp] | Freq: Every day | OPHTHALMIC | Status: DC
  Administered 2013-04-07 – 2013-04-09 (×4): 1 [drp] via OPHTHALMIC
  Filled 2013-04-06 (×2): qty 2.5

## 2013-04-06 NOTE — ED Provider Notes (Signed)
CSN: [id number]     Arrival date & time 04/06/13  1858 History   First MD Initiated Contact with Patient 04/06/13 2020     Chief Complaint  Patient presents with  . Emesis  . Diarrhea   (Consider location/radiation/quality/duration/timing/severity/associated sxs/prior Treatment) HPI Comments: Patient is a 62 year old male with history of diabetes, allergic rhinitis, colon cancer, hypertension who presents today with sudden onset of vomiting, diarrhea, dry mouth today. He reports that he had similar to symptoms which included runny nose and scratchy throat on Thursday. He felt significantly improved on Friday. This is generally what his allergic rhinitis feels like and he did not think anything else of these symptoms. Today he began to have nonbloody emesis and watery diarrhea. He is very thirsty. He is a type I diabetic and has been compliant with his diabetes medication. He reports that he is a brittle diabetic and shackles to control his blood sugar. He is alert and oriented and mentating well.  The history is provided by the patient. No language interpreter was used.    Past Medical History  Diagnosis Date  . Diabetes mellitus   . Glaucoma   . Allergic rhinitis   . Prostate nodule   . Colonic mass   . Cancer   . Substance abuse   . Cancer of ascending colon, 7cm 05/25/2011  . Hypertension   . Pneumonia 1979 or 1980  . Anemia   . Diabetes mellitus type I    Past Surgical History  Procedure Laterality Date  . Finger surgery  2009    right middle  . Nasal fracture surgery  1968  . Tonsillectomy  1957 - approximate  . Laparoscopic assisted ileocolectomy on 06/02/11 for adenocarcinoma    . Portacath placement  07/01/2011    Procedure: INSERTION PORT-A-CATH;  Surgeon: Ardeth Sportsman, MD;  Location: [facility];  Service: General;  Laterality: Left;  Insertion of Port-A-Catheter Left Internal Jugular   Family History  Problem Relation Age of Onset  . Colon polyps Father   . Lung cancer  Father   . Diabetes Father   . Breast cancer Mother   . Pancreatitis Mother     intestinal adhesions  . Insomnia Mother    History  Substance Use Topics  . Smoking status: Former Smoker -- 1.00 packs/day for 15 years    Types: Cigarettes    Quit date: 04/18/1993  . Smokeless tobacco: Never Used     Comment: marijuana every night   . Alcohol Use: Yes     Comment: 1 drink every 2 days    Review of Systems  Constitutional: Negative for fever and chills.  HENT: Positive for rhinorrhea and sore throat.   Respiratory: Negative for shortness of breath.   Cardiovascular: Negative for chest pain.  Gastrointestinal: Positive for nausea, vomiting and diarrhea. Negative for abdominal pain.  Endocrine: Positive for polydipsia. Negative for polyphagia.  All other systems reviewed and are negative.    Allergies  Oxycodone  Home Medications   Current Outpatient Rx  Name  Route  Sig  Dispense  Refill  . ACCU-CHEK AVIVA PLUS test strip      USE TO CHECK SUGAR UP TO 4 TIMES DAILY   100 each   0   . acetaminophen (TYLENOL) 500 MG tablet   Oral   Take 1,000 mg by mouth every 6 (six) hours as needed. Pain          . Ascorbic Acid (VITAMIN C PO)   Oral  Take 1 tablet by mouth every morning.          . brimonidine-timolol (COMBIGAN) 0.2-0.5 % ophthalmic solution   Both Eyes   Place 1 drop into both eyes every 12 (twelve) hours.          Marland Kitchen EXPIRED: ferrous sulfate 325 (65 FE) MG tablet   Oral   Take 1 tablet (325 mg total) by mouth 2 (two) times daily.   30 tablet   11   . ferrous sulfate 325 (65 FE) MG tablet   Oral   Take 325 mg by mouth 2 (two) times daily.         . insulin aspart (NOVOLOG) 100 UNIT/ML injection   Subcutaneous   Inject 3-5 Units into the skin 3 (three) times daily before meals. Take 1 unit for ever 6 grams of carbs before each meal   10 mL   0   . latanoprost (XALATAN) 0.005 % ophthalmic solution   Both Eyes   Place 1 drop into both eyes  at bedtime.         Marland Kitchen levocetirizine (XYZAL) 5 MG tablet   Oral   Take 5 mg by mouth daily before breakfast.          . metoprolol succinate (TOPROL-XL) 50 MG 24 hr tablet   Oral   Take 1 tablet (50 mg total) by mouth daily. Take with or immediately following a meal.   30 tablet   0   . Multiple Vitamin (MULTIVITAMIN) tablet   Oral   Take 1 tablet by mouth every morning.          Marland Kitchen SIMVASTATIN PO   Oral   Take 10 mg by mouth daily before breakfast.          . traZODone (DESYREL) 100 MG tablet      Take one or two tablets at bedtime   60 tablet   2   . triamcinolone (NASACORT) 55 MCG/ACT nasal inhaler   Nasal   Place 1 spray into the nose every morning. Allergies          . VITAMIN D, CHOLECALCIFEROL, PO   Oral   Take 2,000 Units by mouth every morning. Vitamin D 3          BP 150/74  Pulse 84  Temp(Src) 97.5 F (36.4 C) (Oral)  Resp 18  SpO2 96% Physical Exam  Nursing note and vitals reviewed. Constitutional: He is oriented to person, place, and time. He appears well-developed and well-nourished. No distress.  HENT:  Head: Normocephalic and atraumatic.  Right Ear: External ear normal.  Left Ear: External ear normal.  Nose: Nose normal.  Mouth/Throat: Mucous membranes are dry.  Eyes: Conjunctivae are normal.  Neck: Normal range of motion. No tracheal deviation present.  Cardiovascular: Normal rate, regular rhythm and normal heart sounds.   Pulmonary/Chest: Effort normal and breath sounds normal. No stridor.  Abdominal: Soft. He exhibits no distension. There is no tenderness.  Musculoskeletal: Normal range of motion.  Neurological: He is alert and oriented to person, place, and time.  Skin: Skin is warm and dry. He is not diaphoretic.  Psychiatric: He has a normal mood and affect. His behavior is normal.    ED Course  Procedures (including critical care time) Labs Review Labs Reviewed  CBC WITH DIFFERENTIAL - Abnormal; Notable for the  following:    WBC 15.1 (*)    Neutrophils Relative % 85 (*)    Neutro Abs 12.8 (*)  Lymphocytes Relative 7 (*)    Monocytes Absolute 1.3 (*)    All other components within normal limits  COMPREHENSIVE METABOLIC PANEL - Abnormal; Notable for the following:    Sodium 131 (*)    Potassium 5.9 (*)    Chloride 83 (*)    CO2 10 (*)    Glucose, Bld 623 (*)    BUN 37 (*)    Creatinine, Ser 1.45 (*)    Alkaline Phosphatase 123 (*)    GFR calc non Af Amer 50 (*)    GFR calc Af Amer 58 (*)    All other components within normal limits  GLUCOSE, CAPILLARY - Abnormal; Notable for the following:    Glucose-Capillary 555 (*)    All other components within normal limits  URINALYSIS, ROUTINE W REFLEX MICROSCOPIC - Abnormal; Notable for the following:    Glucose, UA >1000 (*)    Ketones, ur >80 (*)    All other components within normal limits  GLUCOSE, CAPILLARY - Abnormal; Notable for the following:    Glucose-Capillary >600 (*)    All other components within normal limits  GLUCOSE, CAPILLARY - Abnormal; Notable for the following:    Glucose-Capillary 583 (*)    All other components within normal limits  CBC - Abnormal; Notable for the following:    WBC 14.2 (*)    All other components within normal limits  MRSA PCR SCREENING  URINE MICROSCOPIC-ADD ON  BASIC METABOLIC PANEL  BASIC METABOLIC PANEL  BASIC METABOLIC PANEL  BASIC METABOLIC PANEL   Imaging Review Dg Chest 2 View  04/06/2013   CLINICAL DATA:  62 year old male with shortness of breath and hypertension.  EXAM: CHEST  2 VIEW  COMPARISON:  03/09/2012 and prior chest radiographs  FINDINGS: The cardiomediastinal silhouette is unremarkable.  COPD/ emphysema identified.  There is no evidence of focal airspace disease, pulmonary edema, suspicious pulmonary nodule/mass, pleural effusion, or pneumothorax. No acute bony abnormalities are identified.  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    Electronically Signed   By: Laveda Abbe M.D.   On: 04/06/2013 22:07    EKG Interpretation    Date/Time:  Saturday April 06 2013 20:58:56 EST Ventricular Rate:  112 PR Interval:  160 QRS Duration: 96 QT Interval:  337 QTC Calculation: 460 R Axis:   78 Text Interpretation:  Sinus tachycardia Atrial premature complex atrial fibrilation resolved compared to previous ekg Confirmed by ZACKOWSKI  MD, SCOTT ([id number]) on 04/06/2013 9:03:41 PM Also confirmed by Deretha Emory  MD, SCOTT ([id number])  on 04/06/2013 9:53:18 PM            MDM   1. DKA (diabetic ketoacidoses)   2. Nausea and vomiting   3. Atrial fibrillation by electrocardiogram   4. Chronic diastolic heart failure, NYHA class 1   5. Diabetes mellitus type 2 in nonobese   6. HTN (hypertension)   7. Glaucoma    Patient presents in DKA. Placed on glucose stabilizer. VBG pending. Patient is mentating well. Discussed case with Dr. Lovell Sheehan who would like to admit this patient to tele. Admission appreciated. The patient appears reasonably stabilized for admission considering the current resources, flow, and capabilities available in the ED at this time, and I doubt any other [facility] requiring further screening and/or treatment in the ED prior to admission. I discussed this case with Dr. Deretha Emory who agrees with plan.     Mora Bellman, PA-C 04/07/13 0104

## 2013-04-06 NOTE — ED Notes (Signed)
Pt states he started vomiting today around 12:30. Pt describes contents as being yellow bile which started to burn his throat. Pt denies nausea or abdominal pain.

## 2013-04-06 NOTE — ED Notes (Signed)
Pt.'s regular Greg Constant, MD from [place] called with pt family member to check status. Sister is Jillene Bucks and can be reached at [phone]. Family given pt assigned room #.

## 2013-04-06 NOTE — H&P (Signed)
[facility] History and Physical  Greg Robertson VHQ:[id number] DOB: 1951-02-12 DOA: 04/06/2013  Referring physician:  EDP PCP: Rudi Heap, MD  Specialists:   Chief Complaint:  Nausea and Vomiting  HPI: Greg Robertson is a 62 y.o. male with a history of Type 2 Diabetes Mellitus who presents to the ED with complaints of severe nausea and vomiting that started around 12 noon.   He denies having any ABD pain.  2 days earlier he had URI Symptoms of fevers and chills and nasal drainage.  He reports those symptoms resolved.   He was evaluated in the ED and was found to have a glucose level of 623 and a calculated Anion Gap of 38.   He was started on the DKA protocol and referred for admission.      Review of Systems: The patient denies anorexia, fever, chills, headaches, weight loss, vision loss, diplopia, dizziness, decreased hearing, rhinitis, hoarseness, chest pain, syncope, dyspnea on exertion, peripheral edema, balance deficits, cough, hemoptysis, abdominal pain, nausea, vomiting, diarrhea, constipation, hematemesis, melena, hematochezia, severe indigestion/heartburn, dysuria, hematuria, incontinence, muscle weakness, suspicious skin lesions, transient blindness, difficulty walking, depression, unusual weight change, abnormal bleeding, enlarged lymph nodes, angioedema, and breast masses.    Past Medical History  Diagnosis Date  . Diabetes mellitus   . Glaucoma   . Allergic rhinitis   . Prostate nodule   . Colonic mass   . Cancer   . Substance abuse   . Cancer of ascending colon, 7cm 05/25/2011  . Hypertension   . Pneumonia 1979 or 1980  . Anemia   . Diabetes mellitus type I     Past Surgical History  Procedure Laterality Date  . Finger surgery  2009    right middle  . Nasal fracture surgery  1968  . Tonsillectomy  1957 - approximate  . Laparoscopic assisted ileocolectomy on 06/02/11 for adenocarcinoma    . Portacath placement  07/01/2011    Procedure: INSERTION  PORT-A-CATH;  Surgeon: Ardeth Sportsman, MD;  Location: [facility];  Service: General;  Laterality: Left;  Insertion of Port-A-Catheter Left Internal Jugular    Prior to Admission medications   Medication Sig Start Date End Date Taking? Authorizing Provider  ACCU-CHEK AVIVA PLUS test strip USE TO CHECK SUGAR UP TO 4 TIMES DAILY 01/12/13  Yes Ernestina Penna, MD  acetaminophen (TYLENOL) 500 MG tablet Take 1,000 mg by mouth every 6 (six) hours as needed. Pain    Yes Historical Provider, MD  Ascorbic Acid (VITAMIN C PO) Take 1 tablet by mouth every morning.    Yes Historical Provider, MD  brimonidine-timolol (COMBIGAN) 0.2-0.5 % ophthalmic solution Place 1 drop into both eyes every 12 (twelve) hours.    Yes Historical Provider, MD  ferrous sulfate 325 (65 FE) MG tablet Take 325 mg by mouth 2 (two) times daily.   Yes Historical Provider, MD  insulin aspart (NOVOLOG) 100 UNIT/ML injection Inject 3-5 Units into the skin 3 (three) times daily before meals. Take 1 unit for ever 6 grams of carbs before each meal 03/29/13  Yes Mary-Margaret Daphine Deutscher, FNP  insulin glargine (LANTUS) 100 UNIT/ML injection Inject 24 Units into the skin at bedtime.   Yes Historical Provider, MD  latanoprost (XALATAN) 0.005 % ophthalmic solution Place 1 drop into both eyes at bedtime.   Yes Historical Provider, MD  levocetirizine (XYZAL) 5 MG tablet Take 5 mg by mouth daily before breakfast.    Yes Historical Provider, MD  lisinopril (PRINIVIL,ZESTRIL) 5 MG tablet Take  5 mg by mouth daily.   Yes Historical Provider, MD  metoprolol succinate (TOPROL-XL) 50 MG 24 hr tablet Take 1 tablet (50 mg total) by mouth daily. Take with or immediately following a meal. 03/14/12  Yes Marianne L York, PA-C  Multiple Vitamin (MULTIVITAMIN) tablet Take 1 tablet by mouth every morning.    Yes Historical Provider, MD  prochlorperazine (COMPAZINE) 10 MG tablet Take 10 mg by mouth every 6 (six) hours as needed for nausea or vomiting.   Yes Historical Provider,  MD  simvastatin (ZOCOR) 10 MG tablet Take 10 mg by mouth daily.   Yes Historical Provider, MD  traZODone (DESYREL) 100 MG tablet Take one or two tablets at bedtime 02/19/13  Yes Diannia Ruder, MD  triamcinolone (NASACORT) 55 MCG/ACT nasal inhaler Place 1 spray into the nose every morning. Allergies    Yes Historical Provider, MD  VITAMIN D, CHOLECALCIFEROL, PO Take 2,000 Units by mouth every morning. Vitamin D 3   Yes Historical Provider, MD  ferrous sulfate 325 (65 FE) MG tablet Take 1 tablet (325 mg total) by mouth 2 (two) times daily. 06/29/11 06/28/12  Ladene Artist, MD    Allergies  Allergen Reactions  . Oxycodone Nausea And Vomiting    Social History:  reports that he quit smoking about 19 years ago. His smoking use included Cigarettes. He has a 15 pack-year smoking history. He has never used smokeless tobacco. He reports that he drinks alcohol. He reports that he uses illicit drugs (Marijuana).     Family History  Problem Relation Age of Onset  . Colon polyps Father   . Lung cancer Father   . Diabetes Father   . Breast cancer Mother   . Pancreatitis Mother     intestinal adhesions  . Insomnia Mother      Physical Exam:  GEN:  Pleasant  Ill Appearing well developed  62 y.o. Caucasian male  examined  and in no acute distress; cooperative with exam Filed Vitals:   04/06/13 2030 04/06/13 2045 04/06/13 2100 04/06/13 2115  BP: 173/66 170/69 169/74 145/53  Pulse: 111 104 109 116  Temp:      TempSrc:      Resp: 21 16 20 22   SpO2: 98% 99% 98% 100%   Blood pressure 145/53, pulse 116, temperature 97.5 F (36.4 C), temperature source Oral, resp. rate 22, SpO2 100.00%. PSYCH: He is alert and oriented x4; does not appear anxious does not appear depressed; affect is normal HEENT: Normocephalic and Atraumatic, Mucous membranes pink; PERRLA; EOM intact; Fundi:  Benign;  No scleral icterus, Nares: Patent, Oropharynx: Clear, Fair Dentition, Neck:  FROM, no cervical lymphadenopathy nor  thyromegaly or carotid bruit; no JVD; Breasts:: Not examined CHEST WALL: No tenderness CHEST: Normal respiration, clear to auscultation bilaterally HEART: Regular rate and rhythm; no murmurs rubs or gallops BACK: No kyphosis or scoliosis; no CVA tenderness ABDOMEN: Positive Bowel Sounds, soft non-tender; no masses, no organomegaly.   Rectal Exam: Not done EXTREMITIES: No cyanosis, clubbing or edema; no ulcerations. Genitalia: not examined PULSES: 2+ and symmetric SKIN: Normal hydration no rash or ulceration CNS: Cranial nerves 2-12 grossly intact no focal neurologic deficit    Labs on Admission:  Basic Metabolic Panel:  Recent Labs Lab 04/06/13 1925  NA 131*  K 5.9*  CL 83*  CO2 10*  GLUCOSE 623*  BUN 37*  CREATININE 1.45*  CALCIUM 9.8   Liver Function Tests:  Recent Labs Lab 04/06/13 1925  AST 19  ALT 19  ALKPHOS 123*  BILITOT 0.5  PROT 7.9  ALBUMIN 4.9   No results found for this basename: LIPASE, AMYLASE,  in the last 168 hours No results found for this basename: AMMONIA,  in the last 168 hours CBC:  Recent Labs Lab 04/06/13 1925  WBC 15.1*  NEUTROABS 12.8*  HGB 16.5  HCT 46.9  MCV 95.1  PLT 162   Cardiac Enzymes: No results found for this basename: CKTOTAL, CKMB, CKMBINDEX, TROPONINI,  in the last 168 hours  BNP (last 3 results) No results found for this basename: PROBNP,  in the last 8760 hours CBG:  Recent Labs Lab 04/06/13 1925  GLUCAP 555*    Radiological Exams on Admission: Dg Chest 2 View  04/06/2013   CLINICAL DATA:  62 year old male with shortness of breath and hypertension.  EXAM: CHEST  2 VIEW  COMPARISON:  03/09/2012 and prior chest radiographs  FINDINGS: The cardiomediastinal silhouette is unremarkable.  COPD/ emphysema identified.  There is no evidence of focal airspace disease, pulmonary edema, suspicious pulmonary nodule/mass, pleural effusion, or pneumothorax. No acute bony abnormalities are identified.  IMPRESSION: COPD/  emphysema without evidence of acute cardiopulmonary disease.   Electronically Signed   By: Laveda Abbe M.D.   On: 04/06/2013 22:07        Assessment/Plan Principal Problem:   DKA (diabetic ketoacidoses) Active Problems:   Nausea and vomiting   Diabetes mellitus type 2, uncontrolled, without complications   Atrial fibrillation with RVR   Chronic diastolic heart failure, NYHA class 1   HTN (hypertension)     1.  DKA-  Admitted to Stepdown Bed on the DKA protocol with and IV Insulin Drip. Monitoring electrolytes and Glucose levels.    2.  Nausea and Vomiting-  PRN IV Anti-emetics.   Viral Illness versus Food Poisoning.    3.  Paroxysmal Atrial fibrillation- previously on Eliquis, but off Eliquis x 2-3 months due to costs.  Initiate Full dose Aspirin Rx.    4.  Chronic Diastolic CHF-  Monitor for Signs and Sxs of Fluid Overload,Currently very dehydrated due to #1.    5.  HTN-  Monitor BPs, Resume Toprol and Lisinopril when off DKA protocol and taking PO.  IV Lopressor PRN.     6.  DVT prophylaxis with Lovenox.        Code Status:      FULL CODE Family Communication:    No Family Present Disposition Plan:       Inpatient  Time spent:  70 Minutes  Ron Parker [facility] Pager [phone]  If 7PM-7AM, please contact night-coverage [email or web address] Password [facility] 04/06/2013, 10:12 PM

## 2013-04-06 NOTE — ED Notes (Signed)
Pt. Reports nausea , vomitting and diarrhea with dry mouth onset today . Denies fever or chills.

## 2013-04-06 NOTE — ED Notes (Signed)
PA notified of Critical lab values for CBG and CO2 level

## 2013-04-06 NOTE — Progress Notes (Signed)
Called to receive report on Greg Robertson. RN states she will call [facility] back.

## 2013-04-07 LAB — CBC
HCT: 42.7 % (ref 39.0–52.0)
Hemoglobin: 14.9 g/dL (ref 13.0–17.0)
MCV: 93 fL (ref 78.0–100.0)
RDW: 12.4 % (ref 11.5–15.5)
WBC: 14.2 10*3/uL — ABNORMAL HIGH (ref 4.0–10.5)

## 2013-04-07 LAB — BASIC METABOLIC PANEL
BUN: 37 mg/dL — ABNORMAL HIGH (ref 6–23)
BUN: 35 mg/dL — ABNORMAL HIGH (ref 6–23)
CO2: 11 mEq/L — ABNORMAL LOW (ref 19–32)
CO2: 12 mEq/L — ABNORMAL LOW (ref 19–32)
CO2: 19 mEq/L (ref 19–32)
CO2: 24 mEq/L (ref 19–32)
Calcium: 8.6 mg/dL (ref 8.4–10.5)
Chloride: 91 mEq/L — ABNORMAL LOW (ref 96–112)
Chloride: 102 mEq/L (ref 96–112)
Chloride: 104 mEq/L (ref 96–112)
Creatinine, Ser: 1.39 mg/dL — ABNORMAL HIGH (ref 0.50–1.35)
GFR calc Af Amer: 49 mL/min — ABNORMAL LOW (ref >90)
GFR calc Af Amer: 53 mL/min — ABNORMAL LOW (ref >90)
GFR calc Af Amer: 61 mL/min — ABNORMAL LOW (ref >90)
GFR calc non Af Amer: 53 mL/min — ABNORMAL LOW (ref >90)
Glucose, Bld: 398 mg/dL — ABNORMAL HIGH (ref 70–99)
Glucose, Bld: 249 mg/dL — ABNORMAL HIGH (ref 70–99)
Potassium: 4.6 mEq/L (ref 3.5–5.1)
Potassium: 4.6 mEq/L (ref 3.5–5.1)
Potassium: 4.4 mEq/L (ref 3.5–5.1)
Potassium: 4.2 mEq/L (ref 3.5–5.1)
Sodium: 134 mEq/L — ABNORMAL LOW (ref 135–145)
Sodium: 138 mEq/L (ref 135–145)
Sodium: 137 mEq/L (ref 135–145)

## 2013-04-07 LAB — GLUCOSE, CAPILLARY
Glucose-Capillary: 186 mg/dL — ABNORMAL HIGH (ref 70–99)
Glucose-Capillary: 204 mg/dL — ABNORMAL HIGH (ref 70–99)
Glucose-Capillary: 209 mg/dL — ABNORMAL HIGH (ref 70–99)
Glucose-Capillary: 225 mg/dL — ABNORMAL HIGH (ref 70–99)
Glucose-Capillary: 300 mg/dL — ABNORMAL HIGH (ref 70–99)
Glucose-Capillary: 524 mg/dL — ABNORMAL HIGH (ref 70–99)
Glucose-Capillary: 116 mg/dL — ABNORMAL HIGH (ref 70–99)
Glucose-Capillary: 311 mg/dL — ABNORMAL HIGH (ref 70–99)

## 2013-04-07 LAB — MRSA PCR SCREENING: MRSA by PCR: NEGATIVE

## 2013-04-07 MED ORDER — INFLUENZA VAC SPLIT QUAD 0.5 ML IM SUSP
0.5000 mL | INTRAMUSCULAR | Status: AC
  Administered 2013-04-08: 0.5 mL via INTRAMUSCULAR
  Filled 2013-04-07: qty 0.5

## 2013-04-07 MED ORDER — ENOXAPARIN SODIUM 40 MG/0.4ML ~~LOC~~ SOLN
40.0000 mg | SUBCUTANEOUS | Status: DC
  Administered 2013-04-08 – 2013-04-10 (×3): 40 mg via SUBCUTANEOUS
  Filled 2013-04-07 (×3): qty 0.4

## 2013-04-07 MED ORDER — INSULIN GLARGINE 100 UNIT/ML ~~LOC~~ SOLN
12.0000 [IU] | Freq: Once | SUBCUTANEOUS | Status: AC
  Administered 2013-04-07: 12 [IU] via SUBCUTANEOUS
  Filled 2013-04-07 (×2): qty 0.12

## 2013-04-07 MED ORDER — INSULIN ASPART 100 UNIT/ML ~~LOC~~ SOLN
0.0000 [IU] | Freq: Three times a day (TID) | SUBCUTANEOUS | Status: DC
  Administered 2013-04-07 – 2013-04-08 (×2): 11 [IU] via SUBCUTANEOUS
  Administered 2013-04-08 (×2): 3 [IU] via SUBCUTANEOUS

## 2013-04-07 MED ORDER — METOPROLOL TARTRATE 25 MG PO TABS
25.0000 mg | ORAL_TABLET | Freq: Two times a day (BID) | ORAL | Status: DC
  Administered 2013-04-07 – 2013-04-10 (×7): 25 mg via ORAL
  Filled 2013-04-07 (×8): qty 1

## 2013-04-07 MED ORDER — INSULIN GLARGINE 100 UNIT/ML ~~LOC~~ SOLN
15.0000 [IU] | Freq: Every day | SUBCUTANEOUS | Status: DC
  Administered 2013-04-07: 15 [IU] via SUBCUTANEOUS
  Filled 2013-04-07 (×2): qty 0.15

## 2013-04-07 MED ORDER — SODIUM CHLORIDE 0.9 % IV SOLN
1000.0000 mL | INTRAVENOUS | Status: DC
  Administered 2013-04-07 – 2013-04-10 (×5): 1000 mL via INTRAVENOUS

## 2013-04-07 NOTE — Progress Notes (Signed)
[facility] PROGRESS NOTE  Greg Robertson [id number]:[id number] DOB: April 18, 1951 DOA: 04/06/2013 PCP: Rudi Heap, MD  Assessment/Plan: 1-DKA (diabetic ketoacidoses): due to medication non-compliance and diet indiscretion. -anion gap now 9, Bicarb 24 and patient has had > 4 consecutives CBG's in the 200 and <  200 range. -start lantus, SSI and carb modified diet -will discontinue D5 1/2 NS -check A1C  2-Diabetes mellitus type 2, uncontrolled, with renal complications: treatment as mentioned above. SSI and lantus. Follow CBG's and check a1C  3-Atrial fibrillation with RVR: rate controlled, sinus per telemetry and S1 and S2 normal on exam. -will resume metoprolol -continue ASA full dose for now -will need to resume eliquis and follow with cardiology in outpatient setting  4-Chronic diastolic heart failure, NYHA class 1: compensated and chronic. -continue daily weight and strict I's and O's  5-HTN (hypertension): stable. Will resume metoprolol  6-acute on chronic renal failure: pre-renal in nature due to dehydration and continue use of lisinopril. -continue IVF's -continue holding lisinopril  7-Nausea and vomiting: resolved. Most likely associated with DKA  DVT: lovenox   Code Status: Full Family Communication: care discussed with patient at bedside Disposition Plan: home when medically stable   Consultants:  None   Procedures:  See below for x-ray reports   Antibiotics:  None   HPI/Subjective: Feeling better. deneis CP, SOB, abd pain, N/V  Objective: Filed Vitals:   04/07/13 0800  BP: 111/54  Pulse:   Temp: 98.1 F (36.7 C)  Resp: 14    Intake/Output Summary (Last 24 hours) at 04/07/13 0931 Last data filed at 04/07/13 0800  Gross per 24 hour  Intake   2240 ml  Output    350 ml  Net   1890 ml   Filed Weights   04/06/13 2316  Weight: 73.5 kg (162 lb 0.6 oz)    Exam:   General:  Afebrile, no nausea, no vomiting and no abd  pain  Cardiovascular: S1 and S2, no rubs or gallops  Respiratory: CTA bilaterally  Abdomen: soft, NT, ND; positive BS  Musculoskeletal: no edema or cyanosis  Data Reviewed: Basic Metabolic Panel:  Recent Labs Lab 04/06/13 1925 04/07/13 0010 04/07/13 0147 04/07/13 0423 04/07/13 0524  NA 131* 134* 138 137 137  K 5.9* 4.6 4.6 4.4 4.2  CL 83* 91* 97 102 104  CO2 10* 11* 12* 19 24  GLUCOSE 623* 480* 398* 249* 210*  BUN 37* 41* 38* 37* 35*  CREATININE 1.45* 1.67* 1.56* 1.38* 1.39*  CALCIUM 9.8 8.6 8.8 8.1* 8.3*   Liver Function Tests:  Recent Labs Lab 04/06/13 1925  AST 19  ALT 19  ALKPHOS 123*  BILITOT 0.5  PROT 7.9  ALBUMIN 4.9   CBC:  Recent Labs Lab 04/06/13 1925 04/07/13 0010  WBC 15.1* 14.2*  NEUTROABS 12.8*  --   HGB 16.5 14.9  HCT 46.9 42.7  MCV 95.1 93.0  PLT 162 152   BNP (last 3 results) No results found for this basename: PROBNP,  in the last 8760 hours CBG:  Recent Labs Lab 04/06/13 1925 04/06/13 2114 04/06/13 2221  GLUCAP 555* >600* 583*    Recent Results (from the past 240 hour(s))  MRSA PCR SCREENING     Status: None   Collection Time    04/06/13 11:46 PM      Result Value Range Status   MRSA by PCR NEGATIVE  NEGATIVE Final   Comment:            The GeneXpert MRSA  Assay (FDA     approved for NASAL specimens     only), is one component of a     comprehensive MRSA colonization     surveillance program. It is not     intended to diagnose MRSA     infection nor to guide or     monitor treatment for     MRSA infections.     Studies: Dg Chest 2 View  04/06/2013   CLINICAL DATA:  62 year old male with shortness of breath and hypertension.  EXAM: CHEST  2 VIEW  COMPARISON:  03/09/2012 and prior chest radiographs  FINDINGS: The cardiomediastinal silhouette is unremarkable.  COPD/ emphysema identified.  There is no evidence of focal airspace disease, pulmonary edema, suspicious pulmonary nodule/mass, pleural effusion, or  pneumothorax. No acute bony abnormalities are identified.  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   Electronically Signed   By: Laveda Abbe M.D.   On: 04/06/2013 22:07    Scheduled Meds: . brimonidine  1 drop Both Eyes BID   And  . timolol  1 drop Both Eyes BID  . enoxaparin (LOVENOX) injection  30 mg Subcutaneous Q24H  . fluticasone  2 spray Each Nare Daily  . [START ON 04/08/2013] influenza vac split quadrivalent PF  0.5 mL Intramuscular Tomorrow-1000  . insulin aspart  0-15 Units Subcutaneous TID WC  . insulin glargine  12 Units Subcutaneous Once  . insulin glargine  15 Units Subcutaneous QHS  . latanoprost  1 drop Both Eyes QHS  . metoprolol tartrate  25 mg Oral BID   Continuous Infusions: . sodium chloride Stopped (04/07/13 0415)  . sodium chloride    . insulin (NOVOLIN-R) infusion 10.5 Units/hr (04/06/13 2227)  . insulin (NOVOLIN-R) infusion 5.3 Units/hr (04/07/13 1610)    Time spent: >30 minutes   [name]  [facility] Pager [phone]. If 7PM-7AM, please contact night-coverage at [email or web address], password [facility] 04/07/2013, 9:31 AM  LOS: 1 day

## 2013-04-07 NOTE — Progress Notes (Signed)
Utilization Review Completed.Greg Robertson T12/21/2014  

## 2013-04-08 LAB — GLUCOSE, CAPILLARY
Glucose-Capillary: 172 mg/dL — ABNORMAL HIGH (ref 70–99)
Glucose-Capillary: 320 mg/dL — ABNORMAL HIGH (ref 70–99)
Glucose-Capillary: 152 mg/dL — ABNORMAL HIGH (ref 70–99)
Glucose-Capillary: 286 mg/dL — ABNORMAL HIGH (ref 70–99)

## 2013-04-08 LAB — BASIC METABOLIC PANEL
BUN: 20 mg/dL (ref 6–23)
CO2: 24 mEq/L (ref 19–32)
Calcium: 8 mg/dL — ABNORMAL LOW (ref 8.4–10.5)
Chloride: 103 mEq/L (ref 96–112)
Creatinine, Ser: 0.94 mg/dL (ref 0.50–1.35)
Glucose, Bld: 188 mg/dL — ABNORMAL HIGH (ref 70–99)

## 2013-04-08 LAB — HEMOGLOBIN A1C
Hgb A1c MFr Bld: 10.2 % — ABNORMAL HIGH (ref <5.7)
Mean Plasma Glucose: 246 mg/dL — ABNORMAL HIGH (ref <117)

## 2013-04-08 MED ORDER — INSULIN ASPART 100 UNIT/ML ~~LOC~~ SOLN
0.0000 [IU] | Freq: Three times a day (TID) | SUBCUTANEOUS | Status: DC
  Administered 2013-04-09: 2 [IU] via SUBCUTANEOUS
  Administered 2013-04-09: 15 [IU] via SUBCUTANEOUS
  Administered 2013-04-10: 11 [IU] via SUBCUTANEOUS

## 2013-04-08 MED ORDER — INSULIN ASPART 100 UNIT/ML ~~LOC~~ SOLN
0.0000 [IU] | Freq: Every day | SUBCUTANEOUS | Status: DC
  Administered 2013-04-08: 3 [IU] via SUBCUTANEOUS

## 2013-04-08 MED ORDER — INSULIN GLARGINE 100 UNIT/ML ~~LOC~~ SOLN
15.0000 [IU] | Freq: Two times a day (BID) | SUBCUTANEOUS | Status: DC
  Administered 2013-04-08 – 2013-04-10 (×4): 15 [IU] via SUBCUTANEOUS
  Filled 2013-04-08 (×5): qty 0.15

## 2013-04-08 NOTE — Care Management Note (Signed)
    Page 1 of 1   04/08/2013     4:03:31 PM   CARE MANAGEMENT NOTE 04/08/2013  Patient:  Greg Robertson, Greg Robertson   Account Number:  [id number]  Date Initiated:  04/08/2013  Documentation initiated by:  Burlene Montecalvo  Subjective/Objective Assessment:   adm with dx of DKA; lives with 62 yr-old father      DC Planning Services  CM consult      HH arranged  HH - 11 Patient Refused      Per UR Regulation:  Reviewed for med. necessity/level of care/duration of stay  Comments:  04/08/13 1556 Chee Dimon RN MSN BSN CCM Pt ambulating in hall with steady gait.  Discussed home health RN for education/support.  Pt states he was not checking CBGs because insurance requirements changed and he now has to pay $60 vs $15 for 100 glucometer strips.  Also states he works full time and would not be available for nurse to visit.  Pt states he knows what he needs to do, has a college degree in biology, and just needs to do it.  Currently refuses home health referral.

## 2013-04-08 NOTE — Progress Notes (Signed)
[facility] PROGRESS NOTE  Greg Robertson AOZ:[id number] DOB: Jun 12, 1950 DOA: 04/06/2013 PCP: Rudi Heap, MD  Assessment/Plan: 1-DKA (diabetic ketoacidoses): due to medication non-compliance and diet indiscretion. -resolved. -will continue adjusting lantus CBG's still in the mid 200 and low 300's -continue SSI and carb modified diet -A1C 10.2  2-Diabetes mellitus type 2, uncontrolled, with renal complications: treatment as mentioned above. SSI and lantus.  -A1C 10.2 -increase lantus to 15 units BID -continue SSI and modified carb diet  3-Atrial fibrillation with RVR: rate controlled, sinus per telemetry and S1 and S2 normal on exam. -will resume metoprolol -continue ASA full dose for now -will need to resume eliquis and follow with cardiology in outpatient setting for further rec's  4-Chronic diastolic heart failure, NYHA class 1: compensated and chronic. -continue daily weight and strict I's and O's  5-HTN (hypertension): stable. Will continue metoprolol  6-acute on chronic renal failure: pre-renal in nature due to dehydration and continue use of lisinopril. -resolved with hydration -continue holding lisinopril one more night  7-Nausea and vomiting: resolved. Most likely associated with DKA  DVT: lovenox   Code Status: Full Family Communication: care discussed with patient at bedside Disposition Plan: home when medically stable with [facility] RN for medication compliance   Consultants:  None   Procedures:  See below for x-ray reports   Antibiotics:  None   HPI/Subjective: Feeling better. denies CP, SOB, abd pain, N/V  Objective: Filed Vitals:   04/08/13 1220  BP: 140/76  Pulse: 65  Temp: 98.3 F (36.8 C)  Resp: 16    Intake/Output Summary (Last 24 hours) at 04/08/13 1459 Last data filed at 04/08/13 1221  Gross per 24 hour  Intake   1050 ml  Output   1400 ml  Net   -350 ml   Filed Weights   04/06/13 2316  Weight: 73.5 kg (162 lb 0.6 oz)     Exam:   General:  Afebrile, no nausea, no vomiting and no abd pain  Cardiovascular: S1 and S2, no rubs or gallops  Respiratory: CTA bilaterally  Abdomen: soft, NT, ND; positive BS  Musculoskeletal: no edema or cyanosis  Data Reviewed: Basic Metabolic Panel:  Recent Labs Lab 04/07/13 0010 04/07/13 0147 04/07/13 0423 04/07/13 0524 04/08/13 0358  NA 134* 138 137 137 135  K 4.6 4.6 4.4 4.2 3.9  CL 91* 97 102 104 103  CO2 11* 12* 19 24 24   GLUCOSE 480* 398* 249* 210* 188*  BUN 41* 38* 37* 35* 20  CREATININE 1.67* 1.56* 1.38* 1.39* 0.94  CALCIUM 8.6 8.8 8.1* 8.3* 8.0*   Liver Function Tests:  Recent Labs Lab 04/06/13 1925  AST 19  ALT 19  ALKPHOS 123*  BILITOT 0.5  PROT 7.9  ALBUMIN 4.9   CBC:  Recent Labs Lab 04/06/13 1925 04/07/13 0010  WBC 15.1* 14.2*  NEUTROABS 12.8*  --   HGB 16.5 14.9  HCT 46.9 42.7  MCV 95.1 93.0  PLT 162 152   BNP (last 3 results) No results found for this basename: PROBNP,  in the last 8760 hours CBG:  Recent Labs Lab 04/07/13 1258 04/07/13 1734 04/07/13 2143 04/08/13 0820 04/08/13 1224  GLUCAP 116* 311* 241* 172* 320*    Recent Results (from the past 240 hour(s))  MRSA PCR SCREENING     Status: None   Collection Time    04/06/13 11:46 PM      Result Value Range Status   MRSA by PCR NEGATIVE  NEGATIVE Final   Comment:  The GeneXpert MRSA Assay (FDA     approved for NASAL specimens     only), is one component of a     comprehensive MRSA colonization     surveillance program. It is not     intended to diagnose MRSA     infection nor to guide or     monitor treatment for     MRSA infections.     Studies: Dg Chest 2 View  04/06/2013   CLINICAL DATA:  62 year old male with shortness of breath and hypertension.  EXAM: CHEST  2 VIEW  COMPARISON:  03/09/2012 and prior chest radiographs  FINDINGS: The cardiomediastinal silhouette is unremarkable.  COPD/ emphysema identified.  There is no evidence of  focal airspace disease, pulmonary edema, suspicious pulmonary nodule/mass, pleural effusion, or pneumothorax. No acute bony abnormalities are identified.  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   Electronically Signed   By: Laveda Abbe M.D.   On: 04/06/2013 22:07    Scheduled Meds: . brimonidine  1 drop Both Eyes BID   And  . timolol  1 drop Both Eyes BID  . enoxaparin (LOVENOX) injection  40 mg Subcutaneous Q24H  . fluticasone  2 spray Each Nare Daily  . insulin aspart  0-15 Units Subcutaneous TID WC  . insulin glargine  15 Units Subcutaneous BID  . latanoprost  1 drop Both Eyes QHS  . metoprolol tartrate  25 mg Oral BID   Continuous Infusions: . sodium chloride 1,000 mL (04/08/13 1336)    Time spent: >30 minutes   Milessa Hogan  [facility] Pager [phone]. If 7PM-7AM, please contact night-coverage at [email or web address], password [facility] 04/08/2013, 2:59 PM  LOS: 2 days

## 2013-04-09 LAB — GLUCOSE, CAPILLARY
Glucose-Capillary: 99 mg/dL (ref 70–99)
Glucose-Capillary: 363 mg/dL — ABNORMAL HIGH (ref 70–99)
Glucose-Capillary: 122 mg/dL — ABNORMAL HIGH (ref 70–99)

## 2013-04-09 NOTE — Progress Notes (Signed)
[facility] PROGRESS NOTE  REESE STOCKMAN ZOX:[id number] DOB: Sep 22, 1950 DOA: 04/06/2013 PCP: Rudi Heap, MD  Assessment/Plan: 1-DKA (diabetic ketoacidoses): due to medication non-compliance and diet indiscretion. -now resolved. -will continue adjusting lantus as CBG's still in the mid 200 and low 300's; will increase to 15 BID and start meal coverage -continue SSI and carb modified diet -A1C 10.2  2-Diabetes mellitus type 2, uncontrolled, with renal complications: treatment as mentioned above. SSI and lantus.  -A1C 10.2 -will add meal coverage -continue SSI and modified carb diet  3-Atrial fibrillation with RVR: rate controlled, sinus per telemetry and S1 and S2 normal on exam. -will resume metoprolol -continue ASA full dose for now -will need to resume eliquis and follow with cardiology in outpatient setting for further rec's  4-Chronic diastolic heart failure, NYHA class 1: compensated and chronic. -continue daily weight and strict I's and O's  5-HTN (hypertension): stable. Will continue metoprolol  6-acute on chronic renal failure: pre-renal in nature due to dehydration and continue use of lisinopril. -resolved with hydration -continue holding lisinopril one more night  7-Nausea and vomiting: resolved. Most likely associated with DKA  DVT: lovenox   Code Status: Full Family Communication: care discussed with patient at bedside Disposition Plan: home when medically stable with [facility] RN for medication compliance   Consultants:  None   Procedures:  See below for x-ray reports   Antibiotics:  None   HPI/Subjective: Feeling better. denies CP, SOB, abd pain, N/V. Will adjust insulin and discharge in am if continue to be stable.  Objective: Filed Vitals:   04/09/13 1530  BP: 130/84  Pulse: 88  Temp: 98.6 F (37 C)  Resp: 16    Intake/Output Summary (Last 24 hours) at 04/09/13 2103 Last data filed at 04/09/13 1800  Gross per 24 hour  Intake    1575 ml  Output   3325 ml  Net  -1750 ml   Filed Weights   04/06/13 2316 04/09/13 1530  Weight: 73.5 kg (162 lb 0.6 oz) 80.7 kg (177 lb 14.6 oz)    Exam:   General:  Afebrile, no nausea, no vomiting and no abd pain  Cardiovascular: S1 and S2, no rubs or gallops  Respiratory: CTA bilaterally  Abdomen: soft, NT, ND; positive BS  Musculoskeletal: no edema or cyanosis  Data Reviewed: Basic Metabolic Panel:  Recent Labs Lab 04/07/13 0010 04/07/13 0147 04/07/13 0423 04/07/13 0524 04/08/13 0358  NA 134* 138 137 137 135  K 4.6 4.6 4.4 4.2 3.9  CL 91* 97 102 104 103  CO2 11* 12* 19 24 24   GLUCOSE 480* 398* 249* 210* 188*  BUN 41* 38* 37* 35* 20  CREATININE 1.67* 1.56* 1.38* 1.39* 0.94  CALCIUM 8.6 8.8 8.1* 8.3* 8.0*   Liver Function Tests:  Recent Labs Lab 04/06/13 1925  AST 19  ALT 19  ALKPHOS 123*  BILITOT 0.5  PROT 7.9  ALBUMIN 4.9   CBC:  Recent Labs Lab 04/06/13 1925 04/07/13 0010  WBC 15.1* 14.2*  NEUTROABS 12.8*  --   HGB 16.5 14.9  HCT 46.9 42.7  MCV 95.1 93.0  PLT 162 152   BNP (last 3 results) No results found for this basename: PROBNP,  in the last 8760 hours CBG:  Recent Labs Lab 04/08/13 2227 04/09/13 0102 04/09/13 0637 04/09/13 1136 04/09/13 1737  GLUCAP 286* 177* 99 363* 122*    Recent Results (from the past 240 hour(s))  MRSA PCR SCREENING     Status: None   Collection  Time    04/06/13 11:46 PM      Result Value Range Status   MRSA by PCR NEGATIVE  NEGATIVE Final   Comment:            The GeneXpert MRSA Assay (FDA     approved for NASAL specimens     only), is one component of a     comprehensive MRSA colonization     surveillance program. It is not     intended to diagnose MRSA     infection nor to guide or     monitor treatment for     MRSA infections.     Studies: No results found.  Scheduled Meds: . brimonidine  1 drop Both Eyes BID   And  . timolol  1 drop Both Eyes BID  . enoxaparin (LOVENOX)  injection  40 mg Subcutaneous Q24H  . fluticasone  2 spray Each Nare Daily  . insulin aspart  0-15 Units Subcutaneous TID WC  . insulin aspart  0-5 Units Subcutaneous QHS  . insulin glargine  15 Units Subcutaneous BID  . latanoprost  1 drop Both Eyes QHS  . metoprolol tartrate  25 mg Oral BID   Continuous Infusions: . sodium chloride 1,000 mL (04/09/13 1650)    Time spent: >30 minutes   Angelyse Heslin  [facility] Pager [phone]. If 7PM-7AM, please contact night-coverage at [email or web address], password [email or web address] 04/09/2013, 9:03 PM  LOS: 3 days

## 2013-04-09 NOTE — Progress Notes (Addendum)
Inpatient Diabetes Program Recommendations  AACE/ADA: New Consensus Statement on Inpatient Glycemic Control (2013)  Target Ranges:  Prepandial:   less than 140 mg/dL      Peak postprandial:   less than 180 mg/dL (1-2 hours)      Critically ill patients:  140 - 180 mg/dL   Reason for Visit: Admitted with DKA, Concerns regarding patient self-care of diabetes  Note:  Saw note regarding expense patient has related to CBG testing at home.  Gave his a handout regarding the generic ReliOn Prime glucose meter at [employer] -- Meter costs $16.24 and test strips are $9 per 50.  Patient lives in [place] so is eligible to go for free diabetes education classes at [facility] by Altamese Cabal, Dietitian.  Gave patient a handout with contact number for registration and encouraged him to consider attending.  Also informed him that the dietitian will also make arrangements to see him 1:1 if necessary.  Thank you.  Saintclair Schroader S. Elsie Lincoln, RN, CNS, CDE Inpatient Diabetes Program, team pager [phone]  Addendum: Results for TYMOTHY, CASS (MRN [id number]) as of 04/09/2013 09:52  Ref. Range 04/08/2013 08:20 04/08/2013 12:24 04/08/2013 17:49 04/08/2013 22:27 04/09/2013 01:02 04/09/2013 06:37  Glucose-Capillary Latest Range: 70-99 mg/dL 147 (H) 829 (H) 562 (H) 286 (H) 177 (H) 99   CBG pattern indicative of need for Novolog meal coverage.  Request MD begin 3 units Novolog tid with meals to be given in addition to correction scale and to be held if patient eats less than 50% of meal or CBG < 80 mg/dl.  Thank you.  Tami Blass S. Elsie Lincoln, RN, CNS, CDE Inpatient Diabetes Program, team pager [phone]

## 2013-04-10 LAB — GLUCOSE, CAPILLARY
Glucose-Capillary: 305 mg/dL — ABNORMAL HIGH (ref 70–99)
Glucose-Capillary: 57 mg/dL — ABNORMAL LOW (ref 70–99)

## 2013-04-10 MED ORDER — METOPROLOL TARTRATE 25 MG PO TABS: 25.0000 mg | ORAL_TABLET | Freq: Two times a day (BID) | ORAL | Status: DC

## 2013-04-10 MED ORDER — GLUCOSE BLOOD VI STRP: ORAL_STRIP | Status: AC

## 2013-04-10 MED ORDER — INSULIN GLARGINE 100 UNIT/ML ~~LOC~~ SOLN: 12.0000 [IU] | Freq: Two times a day (BID) | SUBCUTANEOUS | Status: DC

## 2013-04-10 MED ORDER — ASPIRIN EC 325 MG PO TBEC: 325.0000 mg | DELAYED_RELEASE_TABLET | Freq: Every day | ORAL | Status: DC

## 2013-04-10 MED ORDER — GLUCOSE BLOOD VI STRP: ORAL_STRIP | Status: DC

## 2013-04-10 MED ORDER — INSULIN ASPART 100 UNIT/ML ~~LOC~~ SOLN: 3.0000 [IU] | Freq: Three times a day (TID) | SUBCUTANEOUS | Status: DC

## 2013-04-10 NOTE — Progress Notes (Signed)
Inpatient Diabetes Program Recommendations  AACE/ADA: New Consensus Statement on Inpatient Glycemic Control (2013)  Target Ranges:  Prepandial:   less than 140 mg/dL      Peak postprandial:   less than 180 mg/dL (1-2 hours)      Critically ill patients:  140 - 180 mg/dL  Results for Greg Robertson, Greg Robertson (MRN [id number]) as of 04/10/2013 11:02  Ref. Range 04/09/2013 06:37 04/09/2013 11:36 04/09/2013 17:37 04/09/2013 21:59 04/10/2013 07:48  Glucose-Capillary Latest Range: 70-99 mg/dL 99 782 (H) 956 (H) 213 (H) 66 (L)    Inpatient Diabetes Program Recommendations Insulin - Basal: consider decreasing Lantus dose  May benefit from the addition of Novolog prandial coverage TID. Thank you  Piedad Climes BSN, RN,CDE Inpatient Diabetes Coordinator [phone] (team pager)

## 2013-04-10 NOTE — ED Provider Notes (Signed)
Medical screening examination/treatment/procedure(s) were performed by non-physician practitioner and as supervising physician I was immediately available for consultation/collaboration.  EKG Interpretation    Date/Time:  Saturday April 06 2013 20:58:56 EST Ventricular Rate:  112 PR Interval:  160 QRS Duration: 96 QT Interval:  337 QTC Calculation: 460 R Axis:   78 Text Interpretation:  Sinus tachycardia Atrial premature complex atrial fibrilation resolved compared to previous ekg Confirmed by Heer Justiss  MD, Eldwin Volkov ([id number]) on 04/06/2013 9:03:41 PM Also confirmed by Deretha Emory  MD, Dorthea Maina ([id number])  on 04/06/2013 9:53:18 PM              Shelda Jakes, MD 04/10/13 1415

## 2013-04-10 NOTE — Discharge Summary (Signed)
Physician Discharge Summary  Greg Robertson:[id number] DOB: 17-Dec-1950 DOA: 04/06/2013  PCP: Rudi Heap, MD  Admit date: 04/06/2013 Discharge date: 04/10/2013  Time spent: >30 minutes  Recommendations for Outpatient Follow-up:  1. BMET to follow electrolytes and renal function 2. Close follow up to CBG's and diabetes for adjustment on hypoglycemic regimen 3. Reassess BP and adjust medication as needed  Discharge Diagnoses:  Principal Problem:   DKA (diabetic ketoacidoses) Active Problems:   Diabetes mellitus type 2, uncontrolled, without complications   Atrial fibrillation with RVR   Chronic diastolic heart failure, NYHA class 1   HTN (hypertension)   Nausea and vomiting   Discharge Condition: stable and improved. Will follow with PCP in 1 week.   Diet recommendation: heart healthy and low carb diet  Filed Weights   04/06/13 2316 04/09/13 1530  Weight: 73.5 kg (162 lb 0.6 oz) 80.7 kg (177 lb 14.6 oz)    History of present illness:  62 y.o. male with a history of Type 2 Diabetes Mellitus who presents to the ED with complaints of severe nausea and vomiting that started around 12 noon. He denies having any ABD pain. 2 days earlier he had URI Symptoms of fevers and chills and nasal drainage. He reports those symptoms resolved. He was evaluated in the ED and was found to have a glucose level of 623 and a calculated Anion Gap of 38. He was started on the DKA protocol and referred for admission.    Hospital Course:  1-DKA (diabetic ketoacidoses): due to medication non-compliance and diet indiscretion.  -now resolved.  -will discharge on novolog TID with meals base on carbohydrates ingestion and lantus twice a day. -A1C 10.2   2-Diabetes mellitus type 2, uncontrolled, with renal complications: treatment as mentioned above. SSI and lantus.  -A1C 10.2  -advised to follow low carb diet -patient with instructions to follow with diabetes coordinator/support at [facility] -received prescription for cheap glucometer and strip to guaranteed compliance -will use novolog around meals depending carb ingestion and lantus BID 12 units -follow up with PCP for further adjustments   3-Paroxysmal atrial fibrillation with RVR: rate controlled, sinus per telemetry and S1 and S2 normal on exam.  -will continue metoprolol (changed to tartrate, due to cost of succinate version) -continue ASA full dose for now  -will need to follow up with cardiology in outpatient setting for further rec's and decision on anticoagulation as patient unable to pay for eliquis   4-Chronic diastolic heart failure, NYHA class 1: compensated and chronic.  -continue daily weights -low sodium diet  5-HTN (hypertension): stable. Will continue metoprolol and lisinopril.  6-acute on chronic renal failure: pre-renal in nature due to dehydration and continue use of lisinopril.  -resolved with hydration  -will resume lisonipril at discharge -follow BMET at hospital follow up visit  7-Nausea and vomiting: resolved. Most likely associated with DKA   Procedures: See below for x-ray reports  Consultations:  None   Discharge Exam: Filed Vitals:   04/10/13 1500  BP: 112/85  Pulse: 70  Temp: 98.4 F (36.9 C)  Resp: 18   General: Afebrile, no nausea, no vomiting and no abd pain  Cardiovascular: S1 and S2, no rubs or gallops Respiratory: CTA bilaterally  Abdomen: soft, NT, ND; positive BS  Musculoskeletal: no edema or cyanosis   Discharge Instructions  Discharge Orders   Future Appointments Provider Department Dept Phone   04/25/2013 8:00 AM [facility] [phone]  04/25/2013 9:00 AM [facility] [phone]   Please pick up your oral contrast prep at your scheduled location at least 1 day prior to your appointment. Liquids only 4 hours prior to your exam. Any medications can be taken as usual.  Please arrive 15 min prior to your scheduled exam time.   04/29/2013 8:30 AM Ladene Artist, MD [facility] [phone]   Future Orders Complete By Expires   Diet - low sodium heart healthy  As directed    Discharge instructions  As directed    Comments:     Take medications as prescribed Follow a low carb diet Check cbg's four times a day (before each meal and at bedtime) Lantus now changed to 12 units BID Glucometer and strips ([employer] [facility] [employer], generic) Arrange follow up with PCP in 10 days attend [facility] diabetes education/support classes for further assistance controlling your diabetes       Medication List    STOP taking these medications       metoprolol succinate 50 MG 24 hr tablet  Commonly known as:  TOPROL-XL      TAKE these medications       acetaminophen 500 MG tablet  Commonly known as:  TYLENOL  - Take 1,000 mg by mouth every 6 (six) hours as needed. Pain  -      aspirin EC 325 MG tablet  Take 1 tablet (325 mg total) by mouth daily.     COMBIGAN 0.2-0.5 % ophthalmic solution  Generic drug:  brimonidine-timolol  Place 1 drop into both eyes every 12 (twelve) hours.     ferrous sulfate 325 (65 FE) MG tablet  Take 1 tablet (325 mg total) by mouth 2 (two) times daily.     ferrous sulfate 325 (65 FE) MG tablet  Take 325 mg by mouth 2 (two) times daily.     glucose blood test strip  Use as instructed     glucose blood test strip  Use as instructed     insulin aspart 100 UNIT/ML injection  Commonly known as:  novoLOG  Inject 3-5 Units into the skin 3 (three) times daily before meals. Take 1 unit for ever 6 grams of carbs before each meal     insulin glargine 100 UNIT/ML injection  Commonly known as:  LANTUS  Inject 0.12 mLs (12 Units total) into the skin 2 (two) times daily.     latanoprost 0.005 % ophthalmic solution  Commonly known as:  XALATAN  Place 1 drop into both eyes at bedtime.     levocetirizine 5 MG  tablet  Commonly known as:  XYZAL  Take 5 mg by mouth daily before breakfast.     lisinopril 5 MG tablet  Commonly known as:  PRINIVIL,ZESTRIL  Take 5 mg by mouth daily.     metoprolol tartrate 25 MG tablet  Commonly known as:  LOPRESSOR  Take 1 tablet (25 mg total) by mouth 2 (two) times daily.     multivitamin tablet  Take 1 tablet by mouth every morning.     prochlorperazine 10 MG tablet  Commonly known as:  COMPAZINE  Take 10 mg by mouth every 6 (six) hours as needed for nausea or vomiting.     simvastatin 10 MG tablet  Commonly known as:  ZOCOR  Take 10 mg by mouth daily.     traZODone 100 MG tablet  Commonly known as:  DESYREL  Take one or  two tablets at bedtime     triamcinolone 55 MCG/ACT nasal inhaler  Commonly known as:  NASACORT  - Place 1 spray into the nose every morning. Allergies  -      VITAMIN C PO  Take 1 tablet by mouth every morning.     VITAMIN D (CHOLECALCIFEROL) PO  Take 2,000 Units by mouth every morning. Vitamin D 3       Allergies  Allergen Reactions  . Oxycodone Nausea And Vomiting       Follow-up Information   Follow up with Rudi Heap, MD. Schedule an appointment as soon as possible for a visit in 10 days.   Specialty:  Family Medicine   Contact information:   [address] [place] [address] [phone]        The results of significant diagnostics from this hospitalization (including imaging, microbiology, ancillary and laboratory) are listed below for reference.    Significant Diagnostic Studies: Dg Chest 2 View  04/06/2013   CLINICAL DATA:  62 year old male with shortness of breath and hypertension.  EXAM: CHEST  2 VIEW  COMPARISON:  03/09/2012 and prior chest radiographs  FINDINGS: The cardiomediastinal silhouette is unremarkable.  COPD/ emphysema identified.  There is no evidence of focal airspace disease, pulmonary edema, suspicious pulmonary nodule/mass, pleural effusion, or pneumothorax. No acute bony  abnormalities are identified.  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   Electronically Signed   By: Laveda Abbe M.D.   On: 04/06/2013 22:07    Microbiology: Recent Results (from the past 240 hour(s))  MRSA PCR SCREENING     Status: None   Collection Time    04/06/13 11:46 PM      Result Value Range Status   MRSA by PCR NEGATIVE  NEGATIVE Final   Comment:            The GeneXpert MRSA Assay (FDA     approved for NASAL specimens     only), is one component of a     comprehensive MRSA colonization     surveillance program. It is not     intended to diagnose MRSA     infection nor to guide or     monitor treatment for     MRSA infections.     Labs: Basic Metabolic Panel:  Recent Labs Lab 04/07/13 0010 04/07/13 0147 04/07/13 0423 04/07/13 0524 04/08/13 0358  NA 134* 138 137 137 135  K 4.6 4.6 4.4 4.2 3.9  CL 91* 97 102 104 103  CO2 11* 12* 19 24 24   GLUCOSE 480* 398* 249* 210* 188*  BUN 41* 38* 37* 35* 20  CREATININE 1.67* 1.56* 1.38* 1.39* 0.94  CALCIUM 8.6 8.8 8.1* 8.3* 8.0*   Liver Function Tests:  Recent Labs Lab 04/06/13 1925  AST 19  ALT 19  ALKPHOS 123*  BILITOT 0.5  PROT 7.9  ALBUMIN 4.9   CBC:  Recent Labs Lab 04/06/13 1925 04/07/13 0010  WBC 15.1* 14.2*  NEUTROABS 12.8*  --   HGB 16.5 14.9  HCT 46.9 42.7  MCV 95.1 93.0  PLT 162 152   CBG:  Recent Labs Lab 04/09/13 1737 04/09/13 2159 04/10/13 0748 04/10/13 1132 04/10/13 1611  GLUCAP 122* 140* 66* 305* 57* CBG 132 after orange juice.   Signed:  Kassidy Frankson  [facility] 04/10/2013, 4:28 PM

## 2013-04-10 NOTE — Progress Notes (Signed)
Cbg earlier was 11, asymptomatic, MD aware, given a snack.  Cbg rechecked, 132.

## 2013-04-25 ENCOUNTER — Ambulatory Visit (HOSPITAL_COMMUNITY)
Admission: RE | Admit: 2013-04-25 | Discharge: 2013-04-25 | Disposition: A | Discharge status: Home / Self Care | Payer: BC Managed Care – PPO | Source: Ambulatory Visit | Attending: Oncology | Admitting: Oncology

## 2013-04-25 ENCOUNTER — Encounter (INDEPENDENT_AMBULATORY_CARE_PROVIDER_SITE_OTHER): Payer: Self-pay

## 2013-04-25 ENCOUNTER — Other Ambulatory Visit (HOSPITAL_BASED_OUTPATIENT_CLINIC_OR_DEPARTMENT_OTHER): Payer: BC Managed Care – PPO

## 2013-04-25 ENCOUNTER — Encounter (HOSPITAL_COMMUNITY): Payer: Self-pay

## 2013-04-25 DIAGNOSIS — R911 Solitary pulmonary nodule: Secondary | ICD-10-CM | POA: Insufficient documentation

## 2013-04-25 DIAGNOSIS — C182 Malignant neoplasm of ascending colon: Secondary | ICD-10-CM

## 2013-04-25 DIAGNOSIS — Z85858 Personal history of malignant neoplasm of other endocrine glands: Secondary | ICD-10-CM | POA: Insufficient documentation

## 2013-04-25 DIAGNOSIS — Z9049 Acquired absence of other specified parts of digestive tract: Secondary | ICD-10-CM | POA: Insufficient documentation

## 2013-04-25 LAB — BASIC METABOLIC PANEL (CC13)
Anion Gap: 12 mEq/L — ABNORMAL HIGH (ref 3–11)
BUN: 12 mg/dL (ref 7.0–26.0)
CHLORIDE: 103 meq/L (ref 98–109)
CO2: 28 mEq/L (ref 22–29)
Calcium: 9.9 mg/dL (ref 8.4–10.4)
Creatinine: 1.1 mg/dL (ref 0.7–1.3)
Glucose: 69 mg/dl — ABNORMAL LOW (ref 70–140)
POTASSIUM: 3.9 meq/L (ref 3.5–5.1)
SODIUM: 143 meq/L (ref 136–145)

## 2013-04-25 LAB — CEA: CEA: 2.8 ng/mL (ref 0.0–5.0)

## 2013-04-25 IMAGING — CT CT ABD-PELV W/ CM
2 of 5 series · 16 of 46 positions shown, 18 images · IV contrast (omnipaque)
Comparison: CT of the chest, abdomen and pelvis [DATE].

CLINICAL DATA: History of colon cancer diagnosed in [DATE].
Chemotherapy completed in [DATE]. Followup study.

EXAM:
CT CHEST, ABDOMEN, AND PELVIS WITH CONTRAST
TECHNIQUE: Multidetector CT imaging of the chest, abdomen and pelvis was
performed following the standard protocol during bolus
administration of intravenous contrast.
CONTRAST:  100mL OMNIPAQUE IOHEXOL 300 MG/ML  SOLN

[Series 2: cap with st · axial · 0.74mm/px · z∈[-679,-59]mm · 13 of 142 slices shown, 15 images]
[im 9/142  soft-tissue]
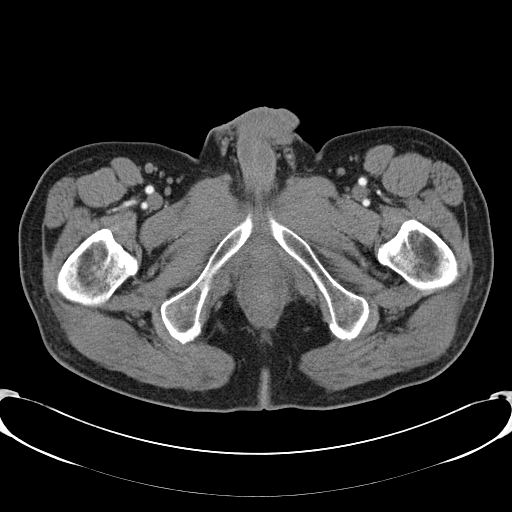
[im 9/142  bone]
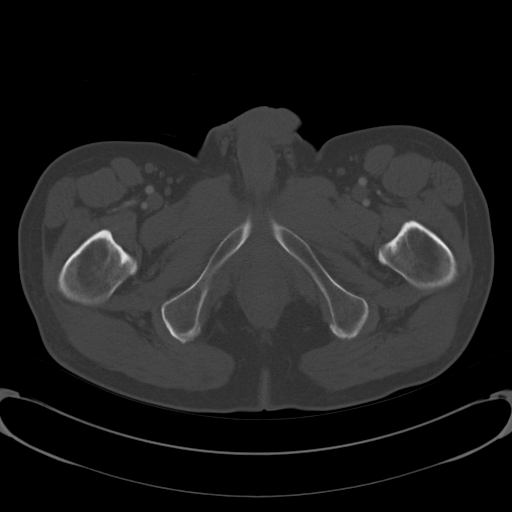
[im 17/142  soft-tissue]
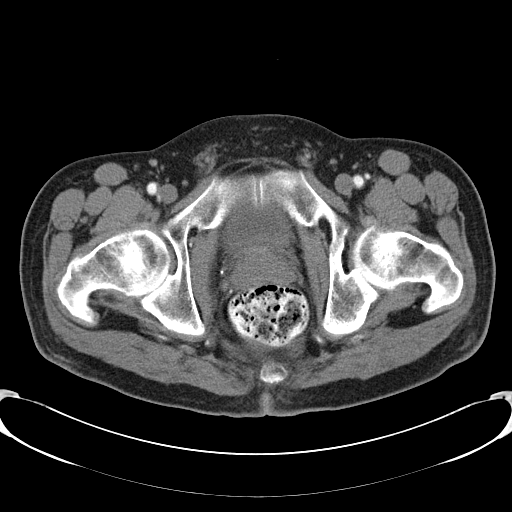
[im 34/142  soft-tissue]
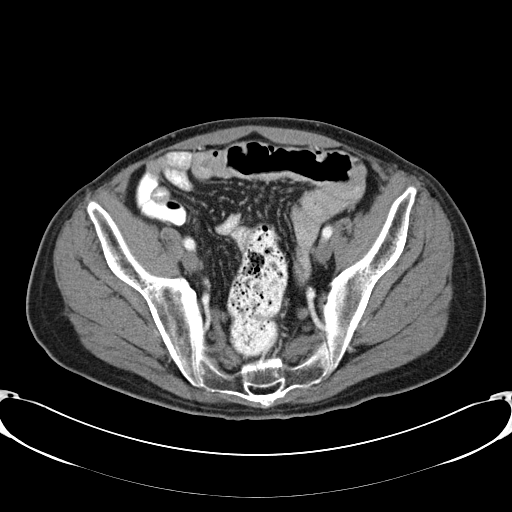
[im 42/142  soft-tissue]
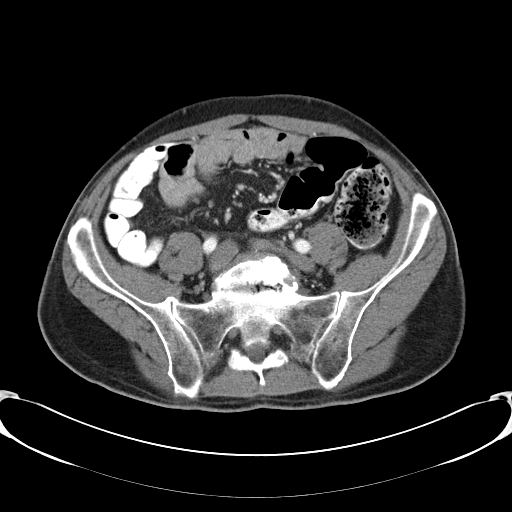
[im 50/142  soft-tissue]
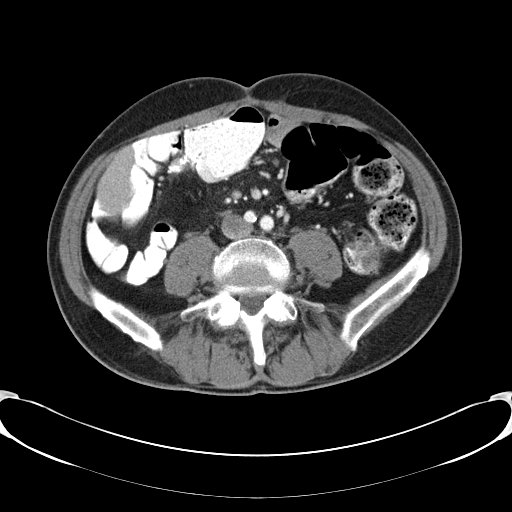
[im 59/142  soft-tissue]
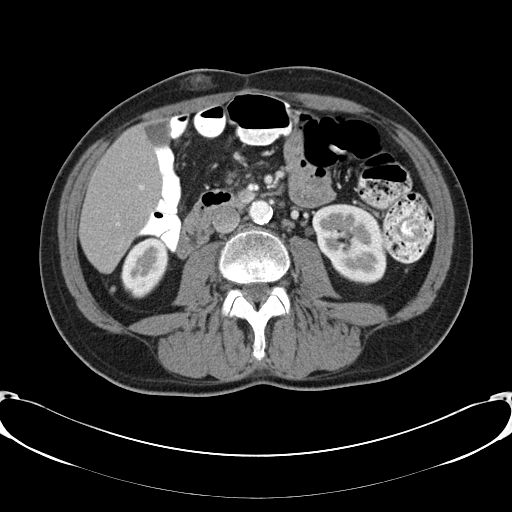
[im 75/142  soft-tissue]
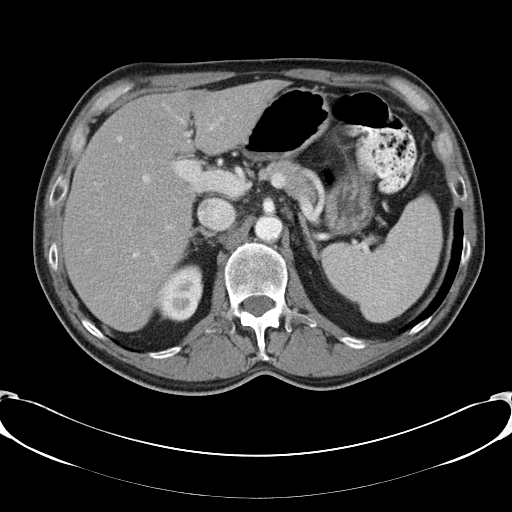
[im 83/142  soft-tissue]
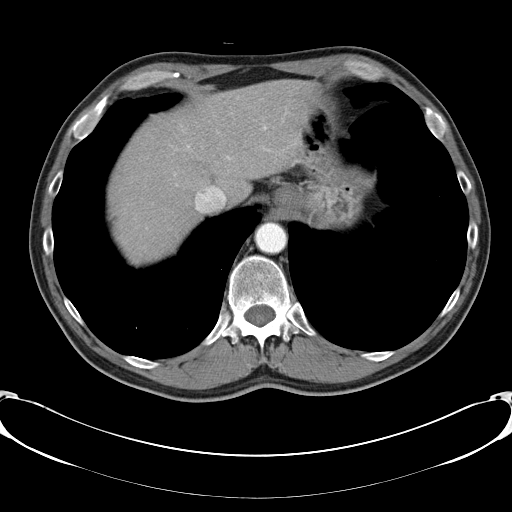
[im 92/142  soft-tissue]
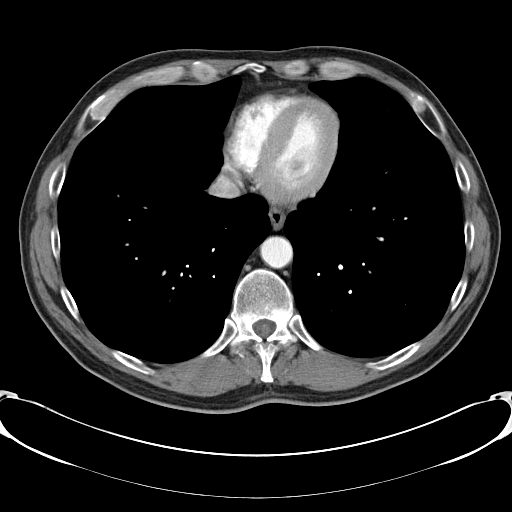
[im 92/142  bone]
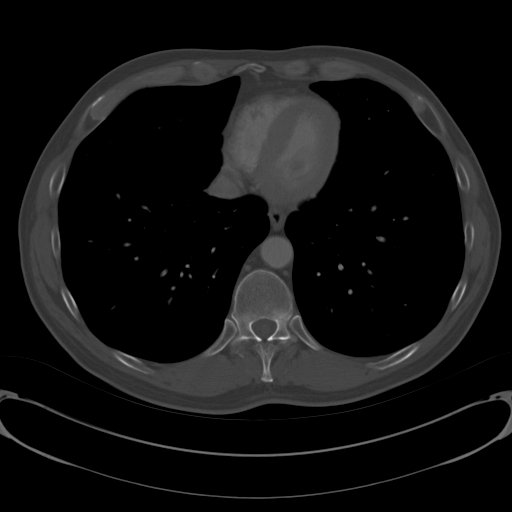
[im 100/142  soft-tissue]
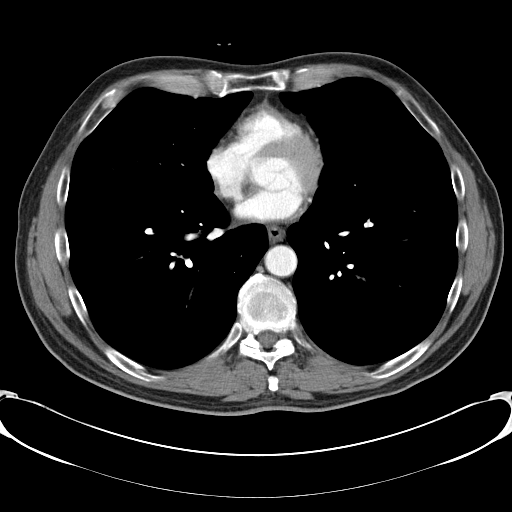
[im 108/142  soft-tissue]
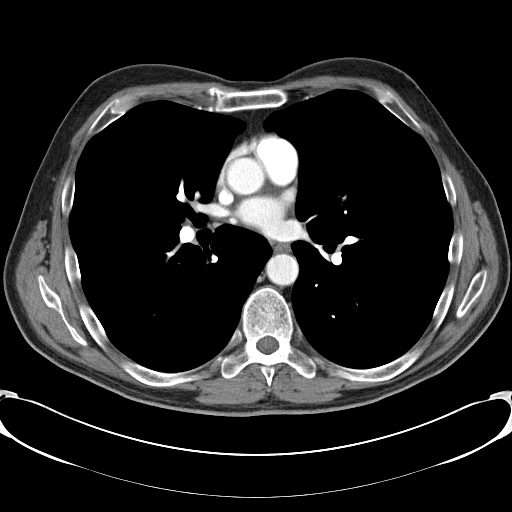
[im 125/142  soft-tissue]
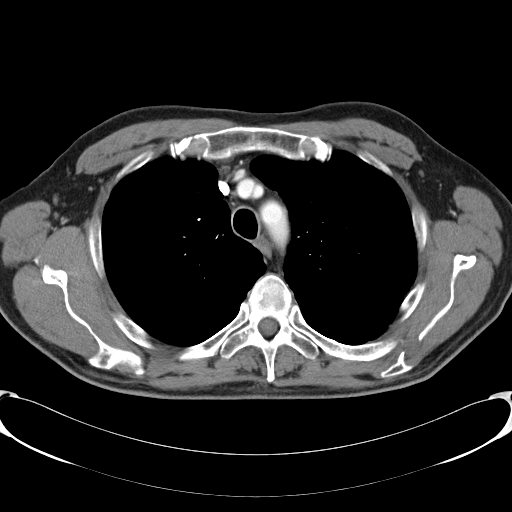
[im 133/142  soft-tissue]
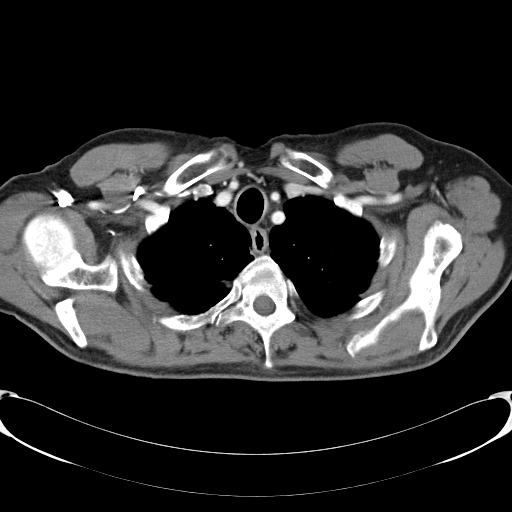

[Series 602: <mpr thick range> · coronal · 1.38mm/px · 3 of 92 slices shown]
[im 31/92  soft-tissue]
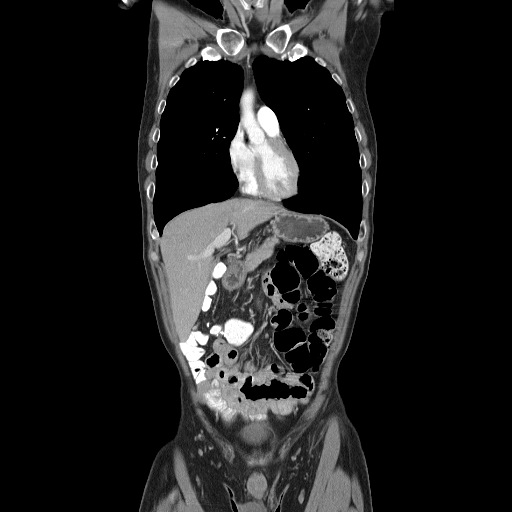
[im 41/92  soft-tissue]
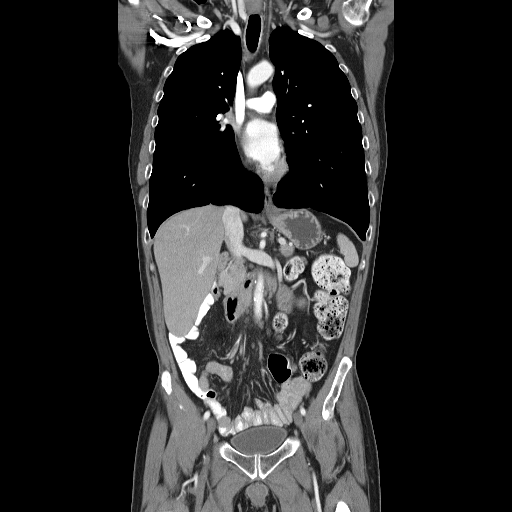
[im 51/92  soft-tissue]
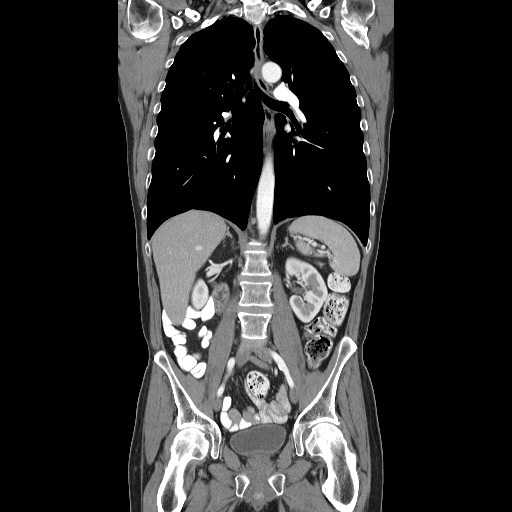

[16 of 46 positions shown; findings below may reference images not displayed]

FINDINGS: CT CHEST FINDINGS

Mediastinum: Heart size is normal. There is no significant
pericardial fluid, thickening or pericardial calcification. No
pathologically enlarged mediastinal or hilar lymph nodes. Esophagus
is unremarkable in appearance.

Lungs/Pleura: 5 mm subpleural nodule in the inferior aspect of the
lingula (image 46 of series 5) is unchanged compared to prior study
[DATE], strongly favored to be benign. No other definite
suspicious appearing pulmonary nodules or masses are identified.
Very mild centrilobular emphysema is noted. No acute consolidative
airspace disease. No pleural effusions. Bilateral apical nodular
pleural parenchymal thickening, similar to prior studies, presumably
chronic post infectious or inflammatory scarring.

Musculoskeletal: There are no aggressive appearing lytic or blastic
lesions noted in the visualized portions of the skeleton.

CT ABDOMEN AND PELVIS FINDINGS

Abdomen/Pelvis: The appearance of the liver, gallbladder, pancreas,
spleen, bilateral adrenal glands and the right kidney is
unremarkable. Sub-cm low-attenuation lesion in the anterior aspect
of the upper pole of the left kidney is too small to definitively
characterize, but similar to prior studies, and statistically likely
a tiny cyst. Extensive atherosclerosis throughout the abdominal and
pelvic vasculature, without evidence of aneurysm or dissection. No
significant volume of ascites. No pneumoperitoneum. No pathologic
distention of small bowel. Status post right hemicolectomy. No
definite lymphadenopathy identified within the abdomen or pelvis.
Prostate gland and urinary bladder are unremarkable in appearance.

Musculoskeletal: There are no aggressive appearing lytic or blastic
lesions noted in the visualized portions of the skeleton.
IMPRESSION: 1. No signs of metastatic disease in the chest, abdomen or pelvis.
2. 5 mm subpleural nodule in the periphery of the lingula (image 48
of series 5), unchanged over the prior 22 month time interval,
strongly favored to be benign.
3. Status post right hemicolectomy.
4. Additional incidental findings, as above.

## 2013-04-25 MED ORDER — IOHEXOL 300 MG/ML  SOLN
100.0000 mL | Freq: Once | INTRAMUSCULAR | Status: AC | PRN
  Administered 2013-04-25: 100 mL via INTRAVENOUS

## 2013-04-29 ENCOUNTER — Telehealth: Payer: Self-pay | Admitting: Oncology

## 2013-04-29 ENCOUNTER — Ambulatory Visit (HOSPITAL_BASED_OUTPATIENT_CLINIC_OR_DEPARTMENT_OTHER): Payer: BC Managed Care – PPO | Admitting: Oncology

## 2013-04-29 VITALS — BP 160/80 | HR 87 | Temp 97.6°F | Resp 20 | Ht 75.0 in | Wt 173.7 lb

## 2013-04-29 DIAGNOSIS — C182 Malignant neoplasm of ascending colon: Secondary | ICD-10-CM

## 2013-04-29 NOTE — Telephone Encounter (Signed)
Gave pt apt for lab and ML for July 2015

## 2013-04-29 NOTE — Progress Notes (Signed)
OFFICE PROGRESS NOTE   INTERVAL HISTORY:   He returns as scheduled. He feels well. Good appetite. No difficulty with bowel function. He was admitted with DKA last month.  Objective:  Vital signs in last 24 hours:  Blood pressure 160/80, pulse 87, temperature 97.6 F (36.4 C), temperature source Oral, resp. rate 20, height 6' 3"  (1.905 m), weight 173 lb 11.2 oz (78.79 kg), SpO2 100.00%.    HEENT: Neck without mass Lymphatics: No cervical, supraclavicular, axillary or inguinal nodes Resp: Lungs clear bilaterally Cardio: Regular rate and rhythm GI: No hepatosplenomegaly, nontender, no mass Vascular: No leg edema   Lab Results:   Radiology:  CTs of the chest, abdomen, and pelvis on 04/25/2013, compared to 04/06/2012: 5 mm subpleural nodule in the lingula-unchanged. Medications: I have reviewed the patient's current medications. The liver is unremarkable. No lymphadenopathy in the abdomen or pelvis.  Assessment/Plan:      1.Stage III (T3 N1) disease poorly differentiated adenocarcinoma of the right colon status post right colectomy on 06/02/2011. Tumor microsatellite unstable, K-ras wild type. Adjuvant FOLFOX chemotherapy initiated on 07/14/2011. Cycle 12 given on 01/05/2012. -Negative surveillance colonoscopy 06/06/2012  -Negative surveillance CT scans 04/25/2013 2. Microcytic anemia. Likely secondary to iron deficiency. Resolved. 3. Insulin-dependent diabetes. 4. Glaucoma. 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 He has seen a Retail buyer. He has been contacted with updated information regarding screening recommendations. 6. Status post Port-A-Cath placement 07/01/2011. The Port-A-Cath has been removed. 7. History of thrombocytopenia secondary to chemotherapy. Oxaliplatin was held with cycle 3. Oxaliplatin was resumed with  cycle 4 at a 25% dose reduction. Oxaliplatin was further dose reduced beginning with cycle 6 due to cytopenias. The oxaliplatin was held with cycle 8, cycle 10 and cycle 11. 8. History of neutropenia secondary to chemotherapy.    Disposition:  Greg Robertson remains in clinical remission from colon cancer. He will return for an office visit and CEA in 6 months.  He will contact Dr. Deatra Ina to discuss the colonoscopy screening recommendation based on the PMS2 mutation.   Betsy Coder, MD 04/29/2013, 9:19 AM

## 2013-05-13 ENCOUNTER — Other Ambulatory Visit: Payer: Self-pay

## 2013-05-13 ENCOUNTER — Telehealth: Payer: Self-pay | Admitting: Dietician

## 2013-05-13 NOTE — Telephone Encounter (Signed)
Brief Outpatient Oncology Nutrition Note  Patient has been identified to be at risk on malnutrition screen.  Wt Readings from Last 10 Encounters:  04/29/13 173 lb 11.2 oz (78.79 kg)  04/09/13 177 lb 14.6 oz (80.7 kg)  02/18/13 179 lb (81.194 kg)  10/29/12 176 lb 6.4 oz (80.015 kg)  10/09/12 174 lb 12.8 oz (79.289 kg)  06/06/12 171 lb (77.565 kg)  05/09/12 171 lb 9.6 oz (77.837 kg)  05/01/12 165 lb (74.844 kg)  04/24/12 167 lb (75.751 kg)  04/09/12 164 lb 3.2 oz (74.481 kg)   Stage III (T3 N1) disease poorly differentiated adenocarcinoma of the right colon status post right colectomy on 06/02/2011. Tumor microsatellite unstable, K-ras wild type. Adjuvant FOLFOX chemotherapy initiated on 07/14/2011. Cycle 12 given on 01/05/2012. -Negative surveillance colonoscopy 06/06/2012  -Negative surveillance CT scans 04/25/2013    Called patient due to weight loss.  Patient reports good appetite and intake and trying to maintain weight.  Eats 3 meals and a HS snack.  Has not yet tried nutritional shakes.    [place] RD available as needed.    Antonieta Iba, RD, LDN

## 2013-05-29 ENCOUNTER — Ambulatory Visit: Payer: Self-pay

## 2013-05-29 ENCOUNTER — Other Ambulatory Visit: Payer: Self-pay

## 2013-09-23 ENCOUNTER — Other Ambulatory Visit: Payer: Self-pay | Admitting: *Deleted

## 2013-09-23 MED ORDER — INSULIN ASPART 100 UNIT/ML ~~LOC~~ SOLN: 3.0000 [IU] | Freq: Three times a day (TID) | SUBCUTANEOUS | Status: DC

## 2013-09-23 MED ORDER — INSULIN GLARGINE 100 UNIT/ML ~~LOC~~ SOLN: 12.0000 [IU] | Freq: Two times a day (BID) | SUBCUTANEOUS | Status: DC

## 2013-09-23 NOTE — Telephone Encounter (Signed)
Patient aware sent in  

## 2013-09-23 NOTE — Addendum Note (Signed)
Addended by: Waverly Ferrari on: 09/23/2013 12:09 PM   Modules accepted: Orders

## 2013-09-23 NOTE — Telephone Encounter (Signed)
Please let patient know that he needs to come in and have an office visit because it has been almost a year since weeks saw him in the office. We will refill the prescription

## 2013-09-23 NOTE — Telephone Encounter (Signed)
Patient last seen in office on 10-29-12. Please advise on refills

## 2013-10-24 ENCOUNTER — Other Ambulatory Visit (HOSPITAL_BASED_OUTPATIENT_CLINIC_OR_DEPARTMENT_OTHER): Payer: BC Managed Care – PPO

## 2013-10-24 DIAGNOSIS — C182 Malignant neoplasm of ascending colon: Secondary | ICD-10-CM

## 2013-10-24 LAB — CEA: CEA: 3.1 ng/mL (ref 0.0–5.0)

## 2013-10-28 ENCOUNTER — Ambulatory Visit: Payer: Self-pay | Admitting: Nurse Practitioner

## 2013-12-03 ENCOUNTER — Encounter: Payer: Self-pay | Admitting: Family Medicine

## 2013-12-03 ENCOUNTER — Ambulatory Visit (INDEPENDENT_AMBULATORY_CARE_PROVIDER_SITE_OTHER): Payer: BC Managed Care – PPO | Admitting: Family Medicine

## 2013-12-03 VITALS — BP 131/81 | HR 83 | Temp 98.7°F | Ht 75.25 in | Wt 167.0 lb

## 2013-12-03 DIAGNOSIS — E1165 Type 2 diabetes mellitus with hyperglycemia: Secondary | ICD-10-CM

## 2013-12-03 DIAGNOSIS — I4891 Unspecified atrial fibrillation: Secondary | ICD-10-CM

## 2013-12-03 DIAGNOSIS — C182 Malignant neoplasm of ascending colon: Secondary | ICD-10-CM

## 2013-12-03 DIAGNOSIS — IMO0001 Reserved for inherently not codable concepts without codable children: Secondary | ICD-10-CM

## 2013-12-03 DIAGNOSIS — Z0000 Encounter for general adult medical examination without abnormal findings: Secondary | ICD-10-CM

## 2013-12-03 DIAGNOSIS — I10 Essential (primary) hypertension: Secondary | ICD-10-CM

## 2013-12-03 DIAGNOSIS — Z1509 Genetic susceptibility to other malignant neoplasm: Secondary | ICD-10-CM

## 2013-12-03 DIAGNOSIS — E785 Hyperlipidemia, unspecified: Secondary | ICD-10-CM

## 2013-12-03 DIAGNOSIS — L853 Xerosis cutis: Secondary | ICD-10-CM

## 2013-12-03 DIAGNOSIS — L851 Acquired keratosis [keratoderma] palmaris et plantaris: Secondary | ICD-10-CM

## 2013-12-03 DIAGNOSIS — N400 Enlarged prostate without lower urinary tract symptoms: Secondary | ICD-10-CM

## 2013-12-03 LAB — POCT CBC
Granulocyte percent: 63.2 %G (ref 37–80)
HCT, POC: 48.7 % (ref 43.5–53.7)
Hemoglobin: 15.9 g/dL (ref 14.1–18.1)
LYMPH, POC: 1.6 (ref 0.6–3.4)
MCH, POC: 31.2 pg (ref 27–31.2)
MCHC: 32.6 g/dL (ref 31.8–35.4)
MCV: 95.5 fL (ref 80–97)
MPV: 7.6 fL (ref 0–99.8)
PLATELET COUNT, POC: 152 10*3/uL (ref 142–424)
POC Granulocyte: 3 (ref 2–6.9)
POC LYMPH PERCENT: 32.5 %L (ref 10–50)
RBC: 5.1 M/uL (ref 4.69–6.13)
RDW, POC: 13 %
WBC: 4.8 10*3/uL (ref 4.6–10.2)

## 2013-12-03 LAB — POCT GLYCOSYLATED HEMOGLOBIN (HGB A1C): Hemoglobin A1C: 10

## 2013-12-03 LAB — GLUCOSE, POCT (MANUAL RESULT ENTRY): POC GLUCOSE: 320 mg/dL — AB (ref 70–99)

## 2013-12-03 MED ORDER — INSULIN ASPART 100 UNIT/ML ~~LOC~~ SOLN: 3.0000 [IU] | Freq: Three times a day (TID) | SUBCUTANEOUS | Status: DC

## 2013-12-03 MED ORDER — INSULIN GLARGINE 100 UNIT/ML ~~LOC~~ SOLN: 24.0000 [IU] | Freq: Every day | SUBCUTANEOUS | Status: DC

## 2013-12-03 NOTE — Addendum Note (Signed)
Addended by: Earlene Plater on: 12/03/2013 11:40 AM   Modules accepted: Orders

## 2013-12-03 NOTE — Progress Notes (Signed)
Subjective:    Patient ID: Greg Robertson, male    DOB: April 20, 1950, 63 y.o.   MRN: [id number]  HPI Patient is here today for annual wellness exam and follow up of chronic medical problems. The patient is doing well with no particular complaints. He has not been in the office in over a year. He is due for routine lab work urine microalbumin, prostate, PSA and an FOBT. His insulin will be refilled. He does complain of dry skin. The patient sees the oncologist about every 6 months and he is also followed by the gastroenterologist Dr. Erskine Emery. He has not seen a cardiologist in a good while.         Patient Active Problem List   Diagnosis Date Noted  . DKA (diabetic ketoacidoses) 04/06/2013  . Nausea and vomiting 04/06/2013  . Diabetes mellitus type 2 in nonobese 10/29/2012  . HTN (hypertension) 03/14/2012  . Chronic diastolic heart failure, NYHA class 1 03/12/2012  . Demand ischemia 03/12/2012  . Fracture of left clavicle-distal 03/12/2012  . NSTEMI (non-ST elevated myocardial infarction) 03/11/2012  . Atrial fibrillation with RVR 03/11/2012  . Diabetic ketoacidosis 03/10/2012  . Dehydration, severe 03/10/2012  . Right bundle branch block 03/10/2012  . Atrial fibrillation by electrocardiogram 03/10/2012  . Elevated troponin I level 03/10/2012  . Lynch syndrome 07/28/2011  . Stage III (T3 N1) disease poorly differentiated adenocarcinoma of the right colon status post right colectomy on 06/02/2011 05/25/2011  . Nonspecific abnormal finding in stool contents 05/10/2011  . Diabetes mellitus type 2, uncontrolled, without complications 40/98/1191  . Glaucoma 05/10/2011   Outpatient Encounter Prescriptions as of 12/03/2013  Medication Sig  . acetaminophen (TYLENOL) 500 MG tablet Take 1,000 mg by mouth every 6 (six) hours as needed. Pain   . Ascorbic Acid (VITAMIN C PO) Take 1 tablet by mouth every morning.   Marland Kitchen aspirin EC 81 MG tablet Take 81 mg by mouth daily.  .  brimonidine-timolol (COMBIGAN) 0.2-0.5 % ophthalmic solution Place 1 drop into both eyes every 12 (twelve) hours.   . ferrous sulfate 325 (65 FE) MG tablet Take 325 mg by mouth 2 (two) times daily with a meal.  . glucose blood test strip Use as instructed  . insulin aspart (NOVOLOG) 100 UNIT/ML injection Inject 3-5 Units into the skin 3 (three) times daily before meals. Take 1 unit for ever 6 grams of carbs before each meal  . insulin glargine (LANTUS) 100 UNIT/ML injection Inject 24 Units into the skin at bedtime.  Marland Kitchen latanoprost (XALATAN) 0.005 % ophthalmic solution Place 1 drop into both eyes at bedtime.  . Multiple Vitamin (MULTIVITAMIN) tablet Take 1 tablet by mouth every morning.   . triamcinolone (NASACORT) 55 MCG/ACT nasal inhaler Place 1 spray into the nose every morning. Allergies   . VITAMIN D, CHOLECALCIFEROL, PO Take 2,000 Units by mouth every morning. Vitamin D 3  . [DISCONTINUED] insulin glargine (LANTUS) 100 UNIT/ML injection Inject 0.12 mLs (12 Units total) into the skin 2 (two) times daily.  . [DISCONTINUED] aspirin EC 325 MG tablet Take 1 tablet (325 mg total) by mouth daily.  . [DISCONTINUED] levocetirizine (XYZAL) 5 MG tablet Take 5 mg by mouth daily before breakfast.   . [DISCONTINUED] lisinopril (PRINIVIL,ZESTRIL) 5 MG tablet Take 5 mg by mouth daily.  . [DISCONTINUED] metoprolol tartrate (LOPRESSOR) 25 MG tablet Take 1 tablet (25 mg total) by mouth 2 (two) times daily.  . [DISCONTINUED] prochlorperazine (COMPAZINE) 10 MG tablet Take 10 mg by mouth every  6 (six) hours as needed for nausea or vomiting.  . [DISCONTINUED] simvastatin (ZOCOR) 10 MG tablet Take 10 mg by mouth daily.  . [DISCONTINUED] traZODone (DESYREL) 100 MG tablet Take one or two tablets at bedtime    Review of Systems  Constitutional: Negative.   HENT: Negative.   Eyes: Negative.   Respiratory: Negative.   Cardiovascular: Negative.   Gastrointestinal: Negative.   Endocrine: Negative.   Genitourinary:  Negative.   Musculoskeletal: Negative.   Skin: Negative.        Dry skin   Allergic/Immunologic: Negative.   Neurological: Negative.   Hematological: Negative.   Psychiatric/Behavioral: Negative.        Objective:   Physical Exam  Nursing note and vitals reviewed. Constitutional: He is oriented to person, place, and time. He appears well-developed and well-nourished. No distress.  HENT:  Head: Normocephalic and atraumatic.  Right Ear: External ear normal.  Left Ear: External ear normal.  Mouth/Throat: Oropharynx is clear and moist. No oropharyngeal exudate.  Nasal congestion bilaterally  Eyes: Conjunctivae and EOM are normal. Pupils are equal, round, and reactive to light. Right eye exhibits no discharge. Left eye exhibits no discharge. No scleral icterus.  Neck: Normal range of motion. Neck supple. No thyromegaly present.  Cardiovascular: Normal rate, regular rhythm, normal heart sounds and intact distal pulses.  Exam reveals no gallop and no friction rub.   No murmur heard. At 72 per minute  Pulmonary/Chest: Effort normal and breath sounds normal. No respiratory distress. He has no wheezes. He has no rales. He exhibits no tenderness.  Abdominal: Soft. Bowel sounds are normal. He exhibits no mass. There is no tenderness. There is no rebound and no guarding.  Genitourinary: Rectum normal, prostate normal and penis normal.  The prostate was minimally if any at all enlarged . There were no inguinal hernias. The external genitalia were normal. There were no inguinal nodes.  Musculoskeletal: Normal range of motion. He exhibits no edema and no tenderness.  Lymphadenopathy:    He has no cervical adenopathy.  Neurological: He is alert and oriented to person, place, and time. He has normal reflexes. No cranial nerve deficit.  Skin: Skin is warm and dry. No rash noted. No erythema. No pallor.  The skin is somewhat dry but there is no flaking or cracks in the skin.  Psychiatric: He has a  normal mood and affect. His behavior is normal. Judgment and thought content normal.   BP 131/81  Pulse 83  Temp(Src) 98.7 F (37.1 C) (Oral)  Ht 6' 3.25" (1.911 m)  Wt 167 lb (75.751 kg)  BMI 20.74 kg/m2        Assessment & Plan:  1. Atrial fibrillation with RVR - POCT CBC  2. Diabetes mellitus type 2, uncontrolled, without complications - POCT CBC - POCT glycosylated hemoglobin (Hb A1C) - POCT UA - Microalbumin  3. Essential hypertension - BMP8+EGFR - Hepatic function panel - POCT CBC  4. Annual physical exam - BMP8+EGFR - Hepatic function panel - Lipid panel - POCT CBC - POCT glycosylated hemoglobin (Hb A1C) - POCT UA - Microalbumin - POCT UA - Microscopic Only - POCT urinalysis dipstick - PSA, total and free - Vit D  25 hydroxy (rtn osteoporosis monitoring) - Thyroid Panel With TSH  5. Hyperlipidemia - Lipid panel  6. BPH (benign prostatic hyperplasia) - POCT UA - Microscopic Only - POCT urinalysis dipstick - PSA, total and free  7. Stage III (T3 N1) disease poorly differentiated adenocarcinoma of the right  colon status post right colectomy on 06/02/2011  8. Lynch syndrome  9. Dry skin   Meds ordered this encounter  Medications  . DISCONTD: insulin glargine (LANTUS) 100 UNIT/ML injection    Sig: Inject 24 Units into the skin at bedtime.  . ferrous sulfate 325 (65 FE) MG tablet    Sig: Take 325 mg by mouth 2 (two) times daily with a meal.  . aspirin EC 81 MG tablet    Sig: Take 81 mg by mouth daily.  . insulin aspart (NOVOLOG) 100 UNIT/ML injection    Sig: Inject 3-5 Units into the skin 3 (three) times daily before meals. Take 1 unit for ever 6 grams of carbs before each meal    Dispense:  10 mL    Refill:  11    Needs to be seen before next refill  . insulin glargine (LANTUS) 100 UNIT/ML injection    Sig: Inject 0.24 mLs (24 Units total) into the skin at bedtime.    Dispense:  10 mL    Refill:  11   Patient Instructions  Continue current  medications. Continue good therapeutic lifestyle changes which include good diet and exercise. Fall precautions discussed with patient. If an FOBT was given today- please return it to our front desk. If you are over 29 years old - you may need Prevnar 42 or the adult Pneumonia vaccine.  Flu Shots will be available at our office starting mid- September. Please call and schedule a FLU CLINIC APPOINTMENT. ZOSTAVAX- shingles shot- check cost with your insurance   He can purchase Eucerin lotion or Moisturel lotion, these are over-the-counter and may help with your dry skin problem Also stay away from anything that is scented------- All detergent, Dove or [place] soap, and fabric softeners without fragrance would be preferred Keep appointments with oncology, gastroenterology, and we will make an appointment for you to be followed up by the cardiologist    Arrie Senate MD

## 2013-12-03 NOTE — Addendum Note (Signed)
Addended by: Pollyann Kennedy F on: 12/03/2013 11:47 AM   Modules accepted: Orders

## 2013-12-03 NOTE — Patient Instructions (Addendum)
Continue current medications. Continue good therapeutic lifestyle changes which include good diet and exercise. Fall precautions discussed with patient. If an FOBT was given today- please return it to our front desk. If you are over 63 years old - you may need Prevnar 75 or the adult Pneumonia vaccine.  Flu Shots will be available at our office starting mid- September. Please call and schedule a FLU CLINIC APPOINTMENT. ZOSTAVAX- shingles shot- check cost with your insurance   He can purchase Eucerin lotion or Moisturel lotion, these are over-the-counter and may help with your dry skin problem Also stay away from anything that is scented------- All detergent, Dove or [place] soap, and fabric softeners without fragrance would be preferred Keep appointments with oncology, gastroenterology, and we will make an appointment for you to be followed up by the cardiologist

## 2013-12-04 ENCOUNTER — Other Ambulatory Visit (INDEPENDENT_AMBULATORY_CARE_PROVIDER_SITE_OTHER): Payer: BC Managed Care – PPO

## 2013-12-04 ENCOUNTER — Other Ambulatory Visit: Payer: Self-pay | Admitting: Family Medicine

## 2013-12-04 ENCOUNTER — Telehealth: Payer: Self-pay | Admitting: *Deleted

## 2013-12-04 DIAGNOSIS — R7989 Other specified abnormal findings of blood chemistry: Secondary | ICD-10-CM

## 2013-12-04 LAB — BMP8+EGFR
BUN/Creatinine Ratio: 18 (ref 10–22)
BUN: 19 mg/dL (ref 8–27)
CALCIUM: 9.7 mg/dL (ref 8.6–10.2)
CO2: 25 mmol/L (ref 18–29)
CREATININE: 1.06 mg/dL (ref 0.76–1.27)
Chloride: 93 mmol/L — ABNORMAL LOW (ref 97–108)
GFR calc Af Amer: 87 mL/min/{1.73_m2} (ref >59)
GFR, EST NON AFRICAN AMERICAN: 75 mL/min/{1.73_m2} (ref >59)
GLUCOSE: 346 mg/dL — AB (ref 65–99)
POTASSIUM: 5.9 mmol/L — AB (ref 3.5–5.2)
Sodium: 135 mmol/L (ref 134–144)

## 2013-12-04 LAB — HEPATIC FUNCTION PANEL
ALT: 21 IU/L (ref 0–44)
AST: 23 IU/L (ref 0–40)
Albumin: 5 g/dL — ABNORMAL HIGH (ref 3.6–4.8)
Alkaline Phosphatase: 93 IU/L (ref 39–117)
BILIRUBIN TOTAL: 0.4 mg/dL (ref 0.0–1.2)
Bilirubin, Direct: 0.13 mg/dL (ref 0.00–0.40)
TOTAL PROTEIN: 6.8 g/dL (ref 6.0–8.5)

## 2013-12-04 LAB — LIPID PANEL
CHOLESTEROL TOTAL: 234 mg/dL — AB (ref 100–199)
Chol/HDL Ratio: 3.2 ratio units (ref 0.0–5.0)
HDL: 74 mg/dL (ref >39)
LDL CALC: 144 mg/dL — AB (ref 0–99)
Triglycerides: 80 mg/dL (ref 0–149)
VLDL Cholesterol Cal: 16 mg/dL (ref 5–40)

## 2013-12-04 LAB — THYROID PANEL WITH TSH
Free Thyroxine Index: 2.1 (ref 1.2–4.9)
T3 Uptake Ratio: 31 % (ref 24–39)
T4 TOTAL: 6.8 ug/dL (ref 4.5–12.0)
TSH: 1.11 u[IU]/mL (ref 0.450–4.500)

## 2013-12-04 LAB — PSA, TOTAL AND FREE
PSA FREE PCT: 17 %
PSA, Free: 0.51 ng/mL
PSA: 3 ng/mL (ref 0.0–4.0)

## 2013-12-04 LAB — VITAMIN D 25 HYDROXY (VIT D DEFICIENCY, FRACTURES): Vit D, 25-Hydroxy: 23.1 ng/mL — ABNORMAL LOW (ref 30.0–100.0)

## 2013-12-04 NOTE — Telephone Encounter (Signed)
Message copied by Marin Olp on Wed Dec 04, 2013  1:24 PM ------      Message from: Chipper Herb      Created: Wed Dec 04, 2013  8:54 AM       The blood sugar was significantly elevated at 346. The creatinine, the most important kidney function test was within normal limits. The potassium was elevated at 5.9 or greater. The patient should return to clinic today and get a repeat potassium at no charge.+++++++      1 liver function tests was very slightly elevated and this was the albumin. We will recheck this in the future.      The total cholesterol was elevated at 234. The triglycerides were good at 80. The LDL C. was elevated at 144------ when the patient meets with the clinical pharmacist we will also address the elevated cholesterol and its management and treatment.++++++++ the patient should be started on a statin drug because he has diabetes.      The PSA is 3.0. This is within the normal range. One year ago was 2.52. The rate of rise is within normal limits.      The vitamin D level is very low at 23.1.----- please confirm the current amount of vitamin D that the patient is taking. Make sure that he is taking vitamin D. This will need to be increased by 1000 daily.      All thyroid function tests are within normal limits ------

## 2013-12-04 NOTE — Telephone Encounter (Signed)
Pt advised of labresults Will come back in today for repeat potassium Verbalizes understanding

## 2013-12-05 LAB — BMP8+EGFR
BUN / CREAT RATIO: 16 (ref 10–22)
BUN: 18 mg/dL (ref 8–27)
CO2: 26 mmol/L (ref 18–29)
Calcium: 10 mg/dL (ref 8.6–10.2)
Chloride: 97 mmol/L (ref 97–108)
Creatinine, Ser: 1.1 mg/dL (ref 0.76–1.27)
GFR, EST AFRICAN AMERICAN: 83 mL/min/{1.73_m2} (ref >59)
GFR, EST NON AFRICAN AMERICAN: 72 mL/min/{1.73_m2} (ref >59)
Glucose: 254 mg/dL — ABNORMAL HIGH (ref 65–99)
POTASSIUM: 5.3 mmol/L — AB (ref 3.5–5.2)
Sodium: 138 mmol/L (ref 134–144)

## 2013-12-12 ENCOUNTER — Ambulatory Visit (INDEPENDENT_AMBULATORY_CARE_PROVIDER_SITE_OTHER): Payer: BC Managed Care – PPO | Admitting: Pharmacist

## 2013-12-12 ENCOUNTER — Encounter: Payer: Self-pay | Admitting: Pharmacist

## 2013-12-12 VITALS — BP 120/68 | HR 80 | Ht 75.25 in | Wt 172.0 lb

## 2013-12-12 DIAGNOSIS — E1069 Type 1 diabetes mellitus with other specified complication: Secondary | ICD-10-CM | POA: Insufficient documentation

## 2013-12-12 DIAGNOSIS — IMO0001 Reserved for inherently not codable concepts without codable children: Secondary | ICD-10-CM

## 2013-12-12 DIAGNOSIS — E1165 Type 2 diabetes mellitus with hyperglycemia: Principal | ICD-10-CM

## 2013-12-12 DIAGNOSIS — E785 Hyperlipidemia, unspecified: Secondary | ICD-10-CM

## 2013-12-12 MED ORDER — SIMVASTATIN 40 MG PO TABS: 40.0000 mg | ORAL_TABLET | Freq: Every day | ORAL | Status: DC

## 2013-12-12 MED ORDER — LISINOPRIL 2.5 MG PO TABS: 2.5000 mg | ORAL_TABLET | Freq: Every day | ORAL | Status: DC

## 2013-12-12 NOTE — Patient Instructions (Signed)
Diabetes and Standards of Medical Care  Diabetes is complicated. You may find that your diabetes team includes a dietitian, nurse, diabetes educator, eye doctor, and more. To help everyone know what is going on and to help you get the care you deserve, the following schedule of care was developed to help keep you on track. Below are the tests, exams, vaccines, medicines, education, and plans you will need.  Blood Glucose Goals Prior to meals = 80 - 130 Within 2 hours of the start of a meal = less than 180  HbA1c test (goal is less than 7.0% - your last value was 7.1%) This test shows how well you have controlled your glucose over the past 2 3 months. It is used to see if your diabetes management plan needs to be adjusted.   It is performed at least 2 times a year if you are meeting treatment goals.  It is performed 4 times a year if therapy has changed or if you are not meeting treatment goals.   Blood pressure test  This test is performed at every routine medical visit. The goal is less than 140/90 mmHg for most people, but 130/80 mmHg in some cases. Ask your health care provider about your goal. Dental exam  Follow up with the dentist regularly. Eye exam  If you are diagnosed with type 1 diabetes as a child, get an exam upon reaching the age of 10 years or older and have had diabetes for 3 5 years. Yearly eye exams are recommended after that initial eye exam.  If you are diagnosed with type 1 diabetes as an adult, get an exam within 5 years of diagnosis and then yearly.  If you are diagnosed with type 2 diabetes, get an exam as soon as possible after the diagnosis and then yearly. Foot care exam  Visual foot exams are performed at every routine medical visit. The exams check for cuts, injuries, or other problems with the feet.  A comprehensive foot exam should be done yearly. This includes visual inspection as well as assessing foot pulses and testing for loss of sensation.  Check  your feet nightly for cuts, injuries, or other problems with your feet. Tell your health care provider if anything is not healing. Kidney function test (urine microalbumin)  This test is performed once a year.  Type 1 diabetes: The first test is performed 5 years after diagnosis.  Type 2 diabetes: The first test is performed at the time of diagnosis.  A serum creatinine and estimated glomerular filtration rate (eGFR) test is done once a year to assess the level of chronic kidney disease (CKD), if present. Lipid profile (cholesterol, HDL, LDL, triglycerides)  Performed every 5 years for most people.  The goal for LDL is less than 100 mg/dL. If you are at high risk, the goal is less than 70 mg/dL.  The goal for HDL is 40 mg/dL 50 mg/dL for men and 50 mg/dL 60 mg/dL for women. An HDL cholesterol of 60 mg/dL or higher gives some protection against heart disease.  The goal for triglycerides is less than 150 mg/dL. Influenza vaccine, pneumococcal vaccine, and hepatitis B vaccine  The influenza vaccine is recommended yearly.  The pneumococcal vaccine is generally given once in a lifetime. However, there are some instances when another vaccination is recommended. Check with your health care provider.  The hepatitis B vaccine is also recommended for adults with diabetes. Diabetes self-management education  Education is recommended at diagnosis and ongoing   as needed. Treatment plan  Your treatment plan is reviewed at every medical visit. Document Released: 01/30/2009 Document Revised: 12/05/2012 Document Reviewed: 09/04/2012 [employer] Patient Information 2014 [employer].   Hypoglycemia Hypoglycemia occurs when the glucose in your blood is too low. Glucose is a type of sugar that is your body's main energy source. Hormones, such as insulin and glucagon, control the level of glucose in the blood. Insulin lowers blood glucose and glucagon increases blood glucose. Having too much insulin  in your blood stream, or not eating enough food containing sugar, can result in hypoglycemia. Hypoglycemia can happen to people with or without diabetes. It can develop quickly and can be a medical emergency.  CAUSES   Missing or delaying meals.  Not eating enough carbohydrates at meals.  Taking too much diabetes medicine.  Not timing your oral diabetes medicine or insulin doses with meals, snacks, and exercise.  Nausea and vomiting.  Certain medicines.  Severe illnesses, such as hepatitis, kidney disorders, and certain eating disorders.  Increased activity or exercise without eating something extra or adjusting medicines.  Drinking too much alcohol.  A nerve disorder that affects body functions like your heart rate, blood pressure, and digestion (autonomic neuropathy).  A condition where the stomach muscles do not function properly (gastroparesis). Therefore, medicines and food may not absorb properly.  Rarely, a tumor of the pancreas can produce too much insulin. SYMPTOMS   Hunger.  Sweating (diaphoresis).  Change in body temperature.  Shakiness.  Headache.  Anxiety.  Lightheadedness.  Irritability.  Difficulty concentrating.  Dry mouth.  Tingling or numbness in the hands or feet.  Restless sleep or sleep disturbances.  Altered speech and coordination.  Change in mental status.  Seizures or prolonged convulsions.  Combativeness.  Drowsiness (lethargic).  Weakness.  Increased heart rate or palpitations.  Confusion.  Pale, gray skin color.  Blurred or double vision.  Fainting. DIAGNOSIS  A physical exam and medical history will be performed. Your caregiver may make a diagnosis based on your symptoms. Blood tests and other lab tests may be performed to confirm a diagnosis. Once the diagnosis is made, your caregiver will see if your signs and symptoms go away once your blood glucose is raised.  TREATMENT  Usually, you can easily treat your  hypoglycemia when you notice symptoms.  Check your blood glucose. If it is less than 70 mg/dl, take one of the following:   3-4 glucose tablets.    cup juice.    cup regular soda.   1 cup skim milk.   -1 tube of glucose gel.   5-6 hard candies.   Avoid high-fat drinks or food that may delay a rise in blood glucose levels.  Do not take more than the recommended amount of sugary foods, drinks, gel, or tablets. Doing so will cause your blood glucose to go too high.   Wait 10-15 minutes and recheck your blood glucose. If it is still less than 70 mg/dl or below your target range, repeat treatment.   Eat a snack if it is more than 1 hour until your next meal.  There may be a time when your blood glucose may go so low that you are unable to treat yourself at home when you start to notice symptoms. You may need someone to help you. You may even faint or be unable to swallow. If you cannot treat yourself, someone will need to bring you to the hospital.  HOME CARE INSTRUCTIONS  If you have diabetes, follow   your diabetes management plan by:  Taking your medicines as directed.  Following your exercise plan.  Following your meal plan. Do not skip meals. Eat on time.  Testing your blood glucose regularly. Check your blood glucose before and after exercise. If you exercise longer or different than usual, be sure to check blood glucose more frequently.  Wearing your medical alert jewelry that says you have diabetes.  Identify the cause of your hypoglycemia. Then, develop ways to prevent the recurrence of hypoglycemia.  Do not take a hot bath or shower right after an insulin shot.  Always carry treatment with you. Glucose tablets are the easiest to carry.  If you are going to drink alcohol, drink it only with meals.  Tell friends or family members ways to keep you safe during a seizure. This may include removing hard or sharp objects from the area or turning you on your  side.  Maintain a healthy weight. SEEK MEDICAL CARE IF:   You are having problems keeping your blood glucose in your target range.  You are having frequent episodes of hypoglycemia.  You feel you might be having side effects from your medicines.  You are not sure why your blood glucose is dropping so low.  You notice a change in vision or a new problem with your vision. SEEK IMMEDIATE MEDICAL CARE IF:   Confusion develops.  A change in mental status occurs.  The inability to swallow develops.  Fainting occurs. Document Released: 04/04/2005 Document Revised: 04/09/2013 Document Reviewed: 08/01/2011 [employer] Patient Information 2015 [employer]. This information is not intended to replace advice given to you by your health care provider. Make sure you discuss any questions you have with your health care provider.  

## 2013-12-12 NOTE — Progress Notes (Signed)
Subjective:      Greg Robertson is a 63 y.o. male who presents for evaluation of Type 1 diabetes mellitus and hyperlipidemia.  The initial diagnosis of diabetes was made several years ago.    His clinical course has fluctuated. Insulin dosage review with Siddarth suggested noncompliance some of the time. Associated symptoms of hyperglycemia have been none.  Associated symptoms of hypoglycemia have been dizziness and sweating.   He is currently taking Novolog prior to each meal - uses CHO Ratio of 1 unit of insulin per every 6 grams of CHO with a correction factor of 50 (gives 1 unit of insulin per each 50 points BG is greater than 100) and Lantus 24 units units at bedtime.  Insulin injections are given by patient.   Compliance with blood glucose monitoring: inadequate.  The patient does perform independently. Rotation of sites for injection: abdominal wall Exercise: every other day  Meal panning: He is using carbohydrate counting. HBG readings - patient did not bring in glucometer.  States he is only checking about 1-2 times daily.    Objective:    BP 120/68  Pulse 80  Ht 6' 3.25" (1.911 m)  Wt 172 lb (78.019 kg)  BMI 21.36 kg/m2   Lab Review      hemoglobin A1C - 10% (11/2013)       LDL = 144 (11/2013)      Tg = 80    Assessment:    Diabetes Mellitus type I, under poor control.  Hyperlipidemia    Plan:    1.  RX changes: add back simvastatin 40mg  1 tablet daily  and lisinopril 2.5mg  daily 2.  Education:  interpretation of lab results, blood sugar goals, complications of diabetes mellitus, hypoglycemia prevention and treatment, self-monitoring of blood glucose skills, use of sliding scale/correction formula and use of insulin: carb ratio 3.  Compliance at present is estimated to be poor. Efforts to improve compliance (if necessary) will be directed at regular blood sugar monitoring: 3 times daily. . Follow up: I recommend diabetes care be 1 month.    Cherre Robins,  PharmD, CPP, CDE

## 2014-01-13 ENCOUNTER — Ambulatory Visit: Payer: Self-pay

## 2014-01-16 ENCOUNTER — Encounter: Payer: Self-pay | Admitting: Pharmacist

## 2014-01-16 ENCOUNTER — Ambulatory Visit (INDEPENDENT_AMBULATORY_CARE_PROVIDER_SITE_OTHER): Payer: BC Managed Care – PPO | Admitting: Pharmacist

## 2014-01-16 VITALS — BP 130/82 | HR 80 | Ht 75.25 in | Wt 172.0 lb

## 2014-01-16 DIAGNOSIS — E139 Other specified diabetes mellitus without complications: Secondary | ICD-10-CM

## 2014-01-16 DIAGNOSIS — E785 Hyperlipidemia, unspecified: Secondary | ICD-10-CM

## 2014-01-16 NOTE — Patient Instructions (Signed)
Increase Lantus to 26 units once a day Change Insulin to carbohydrate ratio to 1 units of insulin per each 8 carbohydrates Continue same correction factor of 1 unit of insulin for each 50  Points blood glucose is over 100.

## 2014-01-16 NOTE — Progress Notes (Signed)
Subjective:      Greg Robertson is a 63 y.o. 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  The initial diagnosis of diabetes was made several years ago.    His clinical course has fluctuated. Insulin dosage review with Bren suggested noncompliance some of the time but compliance has improved since our last visit 1 month ago.   Associated symptoms of hyperglycemia have been none.  Associated symptoms of hypoglycemia have been dizziness and sweating.   He is currently taking Novolog prior to each meal - uses CHO Ratio of 1 unit of insulin per every 6 grams of CHO with a correction factor of 50 (gives 1 unit of insulin per each 50 points BG is greater than 100) and Lantus 24 units units at bedtime.  Insulin injections are given by patient.   Compliance with blood glucose monitoring: Still only checking 1-2 times daily - instructions are to tid / prior to each meal.    14 day BG avg = 149 30 day BG avg = 143 Am - 75, 233, 260, 198, 164, 163 Noon - 68, 116, 104, 169, 213, 70 Pm - 101, 58, 348  The patient does perform independently. Rotation of sites for injection: abdominal wall Exercise: every other day  Meal panning: He is using carbohydrate counting.   Objective:    BP 130/82  Pulse 80  Ht 6' 3.25" (1.911 m)  Wt 172 lb (78.019 kg)  BMI 21.36 kg/m2   Lab Review      hemoglobin A1C - 10% (11/2013)       LDL = 144 (11/2013)      Tg = 80    Assessment:    Diabetes Mellitus type I, under poor control.  Hyperlipidemia - just restarted simvastatin 1 month ago    Plan:    1.  RX changes: Increase Lantus to 26 units once a day  Change Insulin to CHO ratio to 1:8  Continue same correction factor of 50 2.  Education:  interpretation of lab results, blood sugar goals,  hypoglycemia prevention and treatment, self-monitoring of blood glucose skills, use of sliding scale/correction formula and use of insulin: carb ratio 3.  Compliance at  present is estimated to be poor - but improving. Efforts to improve compliance (if necessary) will be directed at regular blood sugar monitoring: 3 times daily. 4.  Follow up: November 2015  Cherre Robins, PharmD, CPP, CDE

## 2014-03-06 ENCOUNTER — Encounter: Payer: Self-pay | Admitting: Pharmacist

## 2014-03-06 ENCOUNTER — Ambulatory Visit (INDEPENDENT_AMBULATORY_CARE_PROVIDER_SITE_OTHER): Payer: BC Managed Care – PPO | Admitting: Pharmacist

## 2014-03-06 VITALS — BP 132/82 | HR 78 | Ht 75.25 in | Wt 174.0 lb

## 2014-03-06 DIAGNOSIS — E1365 Other specified diabetes mellitus with hyperglycemia: Secondary | ICD-10-CM

## 2014-03-06 DIAGNOSIS — IMO0002 Reserved for concepts with insufficient information to code with codable children: Secondary | ICD-10-CM

## 2014-03-06 DIAGNOSIS — Z23 Encounter for immunization: Secondary | ICD-10-CM

## 2014-03-06 LAB — POCT GLYCOSYLATED HEMOGLOBIN (HGB A1C): Hemoglobin A1C: 10.2

## 2014-03-06 NOTE — Progress Notes (Signed)
Subjective:      Greg Robertson is a 63 y.o. 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  The initial diagnosis of diabetes was made several years ago.    His clinical course has fluctuated. Insulin dosage review with Raynold suggested noncompliance some of the time but compliance has improved greatly since our last visit 1 month ago.   Associated symptoms of hyperglycemia have been none.  Associated symptoms of hypoglycemia have been dizziness and sweating.   He is currently taking Novolog prior to each meal - uses CHO Ratio of 1 unit of insulin per every 8 grams of CHO with a correction factor of 50 (gives 1 unit of insulin per each 50 points BG is greater than 100) and Lantus 26 units units at bedtime.  Insulin injections are given by patient.   Compliance with blood glucose monitoring: Still only checking 1-2 times daily - instructions are to tid / prior to each meal.    14 day BG avg = 185 30 day BG avg = 182 Am - 150, 221, 296, 128, 308, 150, 190, 55, 168 Noon - 149, 157, 199, 53, 197 Pm - 1239, 281, 163, 74, 176, 150, 76 The patient does perform independently. Rotation of sites for injection: abdominal wall Exercise: every other day  Meal panning: He is using carbohydrate counting but in reviewing is need for further education.   Objective:    There were no vitals taken for this visit.   Lab Review      hemoglobin A1C - 10.2% (03/06/2014)      LDL = 144 (11/2013)      Tg = 80    Assessment:    Diabetes Mellitus type I, under poor control.  Hyperlipidemia - just restarted simvastatin 1 month ago - not fasting today so cannot check.    Plan:    1.  RX changes: Increase Lantus to 28 units once a day for next 7 days, if BG still over 120 in am and no hypoglycemia then increase to 30 units.  Change Insulin to CHO ratio to 1:7  Continue same correction factor of 50 2.  Education: Reviewed carbohydrate counting - especially potato and rice serving  sizes.   Reviewed interpretation of lab results, blood sugar goals,  hypoglycemia prevention and treatment, self-monitoring of blood glucose skills, use of sliding scale/correction formula and use of insulin: carb ratio 3.  Compliance at present is estimated to be poor - but improving. Efforts to improve compliance (if necessary) will be directed at regular blood sugar monitoring: 3 times daily. 4.  Follow up: January 2016  Cherre Robins, PharmD, CPP, CDE

## 2014-03-06 NOTE — Patient Instructions (Signed)
Increase Lantus to 28 units once a day  Change Insulin to CHO ratio to 1:7   Continue same correction factor of 50   Diabetes and Standards of Medical Care   Diabetes is complicated. You may find that your diabetes team includes a dietitian, nurse, diabetes educator, eye doctor, and more. To help everyone know what is going on and to help you get the care you deserve, the following schedule of care was developed to help keep you on track. Below are the tests, exams, vaccines, medicines, education, and plans you will need.  Blood Glucose Goals Prior to meals = 80 - 130 Within 2 hours of the start of a meal = less than 180  HbA1c test (goal is less than 7.0% - your last value was %) This test shows how well you have controlled your glucose over the past 2 to 3 months. It is used to see if your diabetes management plan needs to be adjusted.   It is performed at least 2 times a year if you are meeting treatment goals.  It is performed 4 times a year if therapy has changed or if you are not meeting treatment goals.  Blood pressure test  This test is performed at every routine medical visit. The goal is less than 140/90 mmHg for most people, but 130/80 mmHg in some cases. Ask your health care provider about your goal.  Dental exam  Follow up with the dentist regularly.  Eye exam  If you are diagnosed with type 1 diabetes as a child, get an exam upon reaching the age of 37 years or older and have had diabetes for 3 to 5 years. Yearly eye exams are recommended after that initial eye exam.  If you are diagnosed with type 1 diabetes as an adult, get an exam within 5 years of diagnosis and then yearly.  If you are diagnosed with type 2 diabetes, get an exam as soon as possible after the diagnosis and then yearly.  Foot care exam  Visual foot exams are performed at every routine medical visit. The exams check for cuts, injuries, or other problems with the feet.  A comprehensive foot  exam should be done yearly. This includes visual inspection as well as assessing foot pulses and testing for loss of sensation.  Check your feet nightly for cuts, injuries, or other problems with your feet. Tell your health care provider if anything is not healing.  Kidney function test (urine microalbumin)  This test is performed once a year.  Type 1 diabetes: The first test is performed 5 years after diagnosis.  Type 2 diabetes: The first test is performed at the time of diagnosis.  A serum creatinine and estimated glomerular filtration rate (eGFR) test is done once a year to assess the level of chronic kidney disease (CKD), if present.  Lipid profile (cholesterol, HDL, LDL, triglycerides)  Performed every 5 years for most people.  The goal for LDL is less than 100 mg/dL. If you are at high risk, the goal is less than 70 mg/dL.  The goal for HDL is 40 mg/dL to 50 mg/dL for men and 50 mg/dL to 60 mg/dL for women. An HDL cholesterol of 60 mg/dL or higher gives some protection against heart disease.  The goal for triglycerides is less than 150 mg/dL.  Influenza vaccine, pneumococcal vaccine, and hepatitis B vaccine  The influenza vaccine is recommended yearly.  The pneumococcal vaccine is generally given once in a lifetime. However, there are  some instances when another vaccination is recommended. Check with your health care provider.  The hepatitis B vaccine is also recommended for adults with diabetes.  Diabetes self-management education  Education is recommended at diagnosis and ongoing as needed.  Treatment plan  Your treatment plan is reviewed at every medical visit.  Document Released: 01/30/2009 Document Revised: 12/05/2012 Document Reviewed: 09/04/2012 [employer] Patient Information 2014 [employer].   

## 2014-03-07 LAB — BMP8+EGFR
BUN / CREAT RATIO: 15 (ref 10–22)
BUN: 15 mg/dL (ref 8–27)
CALCIUM: 9.7 mg/dL (ref 8.6–10.2)
CO2: 28 mmol/L (ref 18–29)
Chloride: 99 mmol/L (ref 97–108)
Creatinine, Ser: 1.01 mg/dL (ref 0.76–1.27)
GFR calc Af Amer: 91 mL/min/{1.73_m2} (ref >59)
GFR calc non Af Amer: 79 mL/min/{1.73_m2} (ref >59)
Glucose: 152 mg/dL — ABNORMAL HIGH (ref 65–99)
Potassium: 5.2 mmol/L (ref 3.5–5.2)
Sodium: 139 mmol/L (ref 134–144)

## 2014-03-27 ENCOUNTER — Other Ambulatory Visit: Payer: Self-pay | Admitting: Family Medicine

## 2014-05-01 ENCOUNTER — Ambulatory Visit: Payer: Self-pay

## 2014-05-17 ENCOUNTER — Encounter: Payer: Self-pay | Admitting: Gastroenterology

## 2014-06-05 ENCOUNTER — Encounter: Payer: Self-pay | Admitting: Family Medicine

## 2014-06-05 ENCOUNTER — Ambulatory Visit (INDEPENDENT_AMBULATORY_CARE_PROVIDER_SITE_OTHER): Payer: BLUE CROSS/BLUE SHIELD | Admitting: Family Medicine

## 2014-06-05 VITALS — BP 130/79 | HR 78 | Temp 97.0°F | Ht 75.25 in | Wt 175.0 lb

## 2014-06-05 DIAGNOSIS — I4891 Unspecified atrial fibrillation: Secondary | ICD-10-CM

## 2014-06-05 DIAGNOSIS — C182 Malignant neoplasm of ascending colon: Secondary | ICD-10-CM

## 2014-06-05 DIAGNOSIS — E559 Vitamin D deficiency, unspecified: Secondary | ICD-10-CM

## 2014-06-05 DIAGNOSIS — E785 Hyperlipidemia, unspecified: Secondary | ICD-10-CM

## 2014-06-05 DIAGNOSIS — I10 Essential (primary) hypertension: Secondary | ICD-10-CM

## 2014-06-05 DIAGNOSIS — IMO0002 Reserved for concepts with insufficient information to code with codable children: Secondary | ICD-10-CM

## 2014-06-05 DIAGNOSIS — E1365 Other specified diabetes mellitus with hyperglycemia: Secondary | ICD-10-CM

## 2014-06-05 DIAGNOSIS — N400 Enlarged prostate without lower urinary tract symptoms: Secondary | ICD-10-CM

## 2014-06-05 NOTE — Progress Notes (Signed)
Subjective:    Patient ID: Greg Robertson, male    DOB: 10-23-1950, 64 y.o.   MRN: [id number]  HPI Pt here for follow up and management of chronic medical problems which include diabetes and atrial fibrillation. He is taking medications regularly. This patient has a history of colon cancer and he is followed regularly by GI and oncology. He is also a fairly brittle diabetic. The patient indicates that he is walking regularly and trying to follow his diet as closely as possible. He indicates that his home blood sugars at the averaging about 170. He has seen the clinical pharmacist and has been working with her recently. As far as his colon cancer is concerned he indicates he is due to see the gastroenterologist for a colonoscopy and he is to call and schedule this. He is also thinking that he needs to see the oncologist and we'll call him and find out when he needs a visit with him. He has not seen the cardiologist in a good while and we will call the cardiologist and find out about a visit with him. The patient is due to get a Prevnar vaccine and he will check with his insurance on this he will also return to get fasting lab work and he is given an FOBT to return today.           Patient Active Problem List   Diagnosis Date Noted  . Hyperlipidemia 12/12/2013  . Nausea and vomiting 04/06/2013  . Diabetes 1.5, managed as type 1 10/29/2012  . HTN (hypertension) 03/14/2012  . Chronic diastolic heart failure, NYHA class 1 03/12/2012  . Demand ischemia 03/12/2012  . Fracture of left clavicle-distal 03/12/2012  . NSTEMI (non-ST elevated myocardial infarction) 03/11/2012  . Atrial fibrillation with RVR 03/11/2012  . Diabetic ketoacidosis 03/10/2012  . Dehydration, severe 03/10/2012  . Right bundle branch block 03/10/2012  . Atrial fibrillation by electrocardiogram 03/10/2012  . Elevated troponin I level 03/10/2012  . Lynch syndrome 07/28/2011  . 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 Nonspecific abnormal finding in stool contents 05/10/2011  . Diabetes mellitus type 2, uncontrolled, without complications 86/76/7209  . Glaucoma 05/10/2011   Outpatient Encounter Prescriptions as of 06/05/2014  Medication Sig  . acetaminophen (TYLENOL) 500 MG tablet Take 1,000 mg by mouth every 6 (six) hours as needed. Pain   . Ascorbic Acid (VITAMIN C PO) Take 1 tablet by mouth every morning.   Marland Kitchen aspirin EC 81 MG tablet Take 81 mg by mouth daily.  . brimonidine-timolol (COMBIGAN) 0.2-0.5 % ophthalmic solution Place 1 drop into both eyes every 12 (twelve) hours.   . ferrous sulfate 325 (65 FE) MG tablet Take 325 mg by mouth 2 (two) times daily with a meal.  . glucose blood test strip Use as instructed  . insulin aspart (NOVOLOG) 100 UNIT/ML injection Inject into the skin 3 (three) times daily before meals. Take 1 unit for every 6 grams of carbs before each meal and 1 unit per each 50 points BG is above 100.  . insulin glargine (LANTUS) 100 UNIT/ML injection Inject 0.26 mLs (26 Units total) into the skin at bedtime.  Marland Kitchen latanoprost (XALATAN) 0.005 % ophthalmic solution Place 1 drop into both eyes at bedtime.  Marland Kitchen levocetirizine (XYZAL) 5 MG tablet TAKE ONE TABLET BY MOUTH ONE TIME DAILY  . lisinopril (PRINIVIL,ZESTRIL) 2.5 MG tablet Take 1 tablet (2.5 mg total) by  mouth daily.  . Multiple Vitamin (MULTIVITAMIN) tablet Take 1 tablet by mouth every morning.   . simvastatin (ZOCOR) 40 MG tablet Take 1 tablet (40 mg total) by mouth at bedtime.  . triamcinolone (NASACORT) 55 MCG/ACT AERO nasal inhaler INSTILL 1 SPRAY IN EACH NOSTRIL ONCE OR TWICE DAILY AS NEEDED.  Marland Kitchen VITAMIN D, CHOLECALCIFEROL, PO Take 2,000 Units by mouth every morning. Vitamin D 3  . [DISCONTINUED] triamcinolone (NASACORT) 55 MCG/ACT nasal inhaler Place 1 spray into the nose every morning. Allergies     Review of Systems    Constitutional: Negative.   HENT: Negative.   Eyes: Negative.   Respiratory: Negative.   Cardiovascular: Negative.   Gastrointestinal: Negative.   Endocrine: Negative.   Genitourinary: Negative.   Musculoskeletal: Negative.   Skin: Negative.   Allergic/Immunologic: Negative.   Neurological: Negative.   Hematological: Negative.   Psychiatric/Behavioral: Negative.        Objective:   Physical Exam  Constitutional: He is oriented to person, place, and time. He appears well-developed and well-nourished. No distress.  HENT:  Head: Normocephalic and atraumatic.  Right Ear: External ear normal.  Mouth/Throat: Oropharynx is clear and moist. No oropharyngeal exudate.  Minimal ear cerumen bilaterally and nasal congestion bilaterally  Eyes: Conjunctivae and EOM are normal. Pupils are equal, round, and reactive to light. Right eye exhibits no discharge. Left eye exhibits no discharge. No scleral icterus.  The patient sees the ophthalmologist yearly in the summertime in the last time he was seen by him everything was stable and good.  Neck: Normal range of motion. Neck supple. No thyromegaly present.  Without bruits or adenopathy  Cardiovascular: Normal rate, regular rhythm, normal heart sounds and intact distal pulses.   No murmur heard. The rhythm is regular at 60/m  Pulmonary/Chest: Effort normal and breath sounds normal. No respiratory distress. He has no wheezes. He has no rales. He exhibits no tenderness.  There was no axillary adenopathy and the chest wall was normal  Abdominal: Soft. Bowel sounds are normal. He exhibits no mass. There is no tenderness. There is no rebound and no guarding.  There was no inguinal adenopathy  Musculoskeletal: Normal range of motion. He exhibits no edema or tenderness.  Lymphadenopathy:    He has no cervical adenopathy.  Neurological: He is alert and oriented to person, place, and time. He has normal reflexes. No cranial nerve deficit.  Skin: Skin is  warm and dry. No rash noted. No erythema. No pallor.  Toenail fungus  Psychiatric: He has a normal mood and affect. His behavior is normal. Judgment and thought content normal.  Nursing note and vitals reviewed.   BP 130/79 mmHg  Pulse 78  Temp(Src) 97 F (36.1 C) (Oral)  Ht 6' 3.25" (1.911 m)  Wt 175 lb (79.379 kg)  BMI 21.74 kg/m2       Assessment & Plan:  1. 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 Hyperlipidemia -Return for lab work and continue with aggressive therapeutic lifestyle changes -Continue with simvastatin - POCT CBC; Future - NMR, lipoprofile; Future  3. Essential hypertension Continue with lisinopril POCT CBC; Future - BMP8+EGFR; Future - Hepatic function panel; Future  4. Atrial fibrillation with RVR -Check with cardiology regarding time of next visit - POCT CBC; Future - BMP8+EGFR; Future - Hepatic function panel; Future  5. BPH (benign  prostatic hyperplasia) -There were no complaints or problems with this today. - POCT CBC; Future  6. Stage III (T3 N1) disease poorly differentiated adenocarcinoma of the right colon status post right colectomy on 06/02/2011 -Follow-up with gastroenterology and oncology - POCT CBC; Future  7. Vitamin D deficiency -We will adjusted vitamin D based on pending lab work - POCT CBC; Future - Vit D  25 hydroxy (rtn osteoporosis monitoring); Future  No orders of the defined types were placed in this encounter.   Patient Instructions  Continue current medications. Continue good therapeutic lifestyle changes which include good diet and exercise. Fall precautions discussed with patient. If an FOBT was given today- please return it to our front desk. If you are over 75 years old - you may need Prevnar 25 or the adult Pneumonia vaccine.  Flu Shots are still  available at our office. If you still haven't had one please call to set up a nurse visit to get one.   After your visit with [place] today you will receive a survey in the mail or online from [employer] regarding your care with [place]. Please take a moment to fill this out. Your feedback is very important to [place] as you can help [place] better understand your patient needs as well as improve your experience and satisfaction. WE CARE ABOUT YOU!!!  Patient should continue with his regular exercise regimen He should continue monitoring his blood sugars and his feet He should check with his insurance regarding the Prevnar vaccine He needs to return to the office for fasting lab work We will check on his follow-up visit with the cardiologist He needs to follow-up with the gastroenterologist and the oncologist   Arrie Senate MD

## 2014-06-05 NOTE — Patient Instructions (Addendum)
Continue current medications. Continue good therapeutic lifestyle changes which include good diet and exercise. Fall precautions discussed with patient. If an FOBT was given today- please return it to our front desk. If you are over 64 years old - you may need Prevnar 61 or the adult Pneumonia vaccine.  Flu Shots are still available at our office. If you still haven't had one please call to set up a nurse visit to get one.   After your visit with [place] today you will receive a survey in the mail or online from [employer] regarding your care with [place]. Please take a moment to fill this out. Your feedback is very important to [place] as you can help [place] better understand your patient needs as well as improve your experience and satisfaction. WE CARE ABOUT YOU!!!  Patient should continue with his regular exercise regimen He should continue monitoring his blood sugars and his feet He should check with his insurance regarding the Prevnar vaccine He needs to return to the office for fasting lab work We will check on his follow-up visit with the cardiologist He needs to follow-up with the gastroenterologist and the oncologist

## 2014-06-11 ENCOUNTER — Other Ambulatory Visit (INDEPENDENT_AMBULATORY_CARE_PROVIDER_SITE_OTHER): Payer: BLUE CROSS/BLUE SHIELD

## 2014-06-11 DIAGNOSIS — Z1212 Encounter for screening for malignant neoplasm of rectum: Secondary | ICD-10-CM

## 2014-06-11 NOTE — Progress Notes (Signed)
Lab only 

## 2014-06-13 LAB — FECAL OCCULT BLOOD, IMMUNOCHEMICAL: FECAL OCCULT BLD: NEGATIVE

## 2014-06-17 ENCOUNTER — Encounter: Payer: Self-pay | Admitting: Family Medicine

## 2014-06-25 ENCOUNTER — Encounter: Payer: Self-pay | Admitting: Gastroenterology

## 2014-07-09 ENCOUNTER — Other Ambulatory Visit: Payer: Self-pay | Admitting: *Deleted

## 2014-07-09 DIAGNOSIS — C182 Malignant neoplasm of ascending colon: Secondary | ICD-10-CM

## 2014-07-10 ENCOUNTER — Ambulatory Visit (HOSPITAL_BASED_OUTPATIENT_CLINIC_OR_DEPARTMENT_OTHER): Payer: BLUE CROSS/BLUE SHIELD | Admitting: Nurse Practitioner

## 2014-07-10 ENCOUNTER — Telehealth: Payer: Self-pay | Admitting: Oncology

## 2014-07-10 ENCOUNTER — Other Ambulatory Visit (HOSPITAL_BASED_OUTPATIENT_CLINIC_OR_DEPARTMENT_OTHER): Payer: BLUE CROSS/BLUE SHIELD

## 2014-07-10 VITALS — BP 145/75 | HR 87 | Temp 98.1°F | Resp 18 | Ht 75.25 in | Wt 177.7 lb

## 2014-07-10 DIAGNOSIS — C182 Malignant neoplasm of ascending colon: Secondary | ICD-10-CM

## 2014-07-10 DIAGNOSIS — Z85038 Personal history of other malignant neoplasm of large intestine: Secondary | ICD-10-CM

## 2014-07-10 DIAGNOSIS — Z1509 Genetic susceptibility to other malignant neoplasm: Secondary | ICD-10-CM

## 2014-07-10 LAB — CEA: CEA: 3.2 ng/mL (ref 0.0–5.0)

## 2014-07-10 NOTE — Progress Notes (Signed)
  Greg Robertson OFFICE PROGRESS NOTE   Diagnosis:  Colon cancer  INTERVAL HISTORY:   Greg Robertson was last seen in January 2015. He denies any change in bowel habits. No bloody or black stools. No abdominal pain. He reports a good appetite. He reports he is scheduled for a colonoscopy in May of this year.  Objective:  Vital signs in last 24 hours:  Blood pressure 145/75, pulse 87, temperature 98.1 F (36.7 C), temperature source Oral, resp. rate 18, height 6' 3.25" (1.911 m), weight 177 lb 11.2 oz (80.604 kg), SpO2 99 %.    HEENT: No thrush or ulcers. Lymphatics: No palpable cervical, supraclavicular, axillary or inguinal lymph nodes. Resp: Lungs clear bilaterally. Cardio: Regular rate and rhythm. GI: Abdomen soft and nontender. No hepatomegaly. No mass. Vascular: No leg edema.  Lab Results:  Lab Results  Component Value Date   WBC 4.8 12/03/2013   HGB 15.9 12/03/2013   HCT 48.7 12/03/2013   MCV 95.5 12/03/2013   PLT 152 04/07/2013   NEUTROABS 12.8* 04/06/2013    Imaging:  No results found.  Medications: I have reviewed the patient's current medications.  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 Tumor microsatellite unstable, K-ras wild type. Adjuvant FOLFOX chemotherapy initiated on 07/14/2011. Cycle 12 given on 01/05/2012.  -Negative surveillance colonoscopy 06/06/2012  -Negative surveillance CT scans 04/25/2013 2. Microcytic anemia. Likely secondary to iron deficiency. Resolved. 3. Insulin-dependent diabetes. 4. Glaucoma. 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 He has seen a Retail buyer. He has been contacted with updated information regarding screening recommendations. 6. Status post  Port-A-Cath placement 07/01/2011. The Port-A-Cath has been removed. 7. History of thrombocytopenia secondary to chemotherapy. Oxaliplatin was held with cycle 3. Oxaliplatin was resumed with cycle 4 at a 25% dose reduction. Oxaliplatin was further dose reduced beginning with cycle 6 due to cytopenias. The oxaliplatin was held with cycle 8, cycle 10 and cycle 11. 8. History of neutropenia secondary to chemotherapy.   Disposition: Greg Robertson remains in clinical remission from colon cancer. We will follow-up on the CEA from today. We made a referral for surveillance CT scans of the chest/abdomen/pelvis in the next few weeks. We will contact him with the result. He will return for a follow-up visit and CEA in 6 months.  He is scheduled for a colonoscopy with Dr. Deatra Ina in May of this year.    Ned Card ANP/GNP-BC   07/10/2014  11:10 AM

## 2014-07-10 NOTE — Telephone Encounter (Signed)
Gave avs & calendar for September. Patient also received contrast for CT scan.

## 2014-07-11 ENCOUNTER — Telehealth: Payer: Self-pay | Admitting: *Deleted

## 2014-07-11 NOTE — Telephone Encounter (Signed)
Patient called back.  Informed him his CEA is normal.

## 2014-07-11 NOTE — Telephone Encounter (Signed)
-----   Message from Ladell Pier, MD sent at 07/10/2014  7:01 PM EDT ----- Please call patient, cea is normal

## 2014-07-11 NOTE — Telephone Encounter (Signed)
Left message on voicemail for pt to call office for lab results. CEA is normal.

## 2014-07-11 NOTE — Telephone Encounter (Signed)
Called and informed patient of normal cea.  Per Lisa K. Thomas, NP.  Patient verbalized understanding.  

## 2014-07-17 ENCOUNTER — Other Ambulatory Visit: Payer: Self-pay | Admitting: *Deleted

## 2014-07-17 ENCOUNTER — Telehealth: Payer: Self-pay | Admitting: Oncology

## 2014-07-17 NOTE — Telephone Encounter (Signed)
s.w. pt and advised on April added lab b4 ct.Marland KitchenMarland KitchenMarland KitchenMarland Kitchenpt ok and aware

## 2014-07-21 ENCOUNTER — Other Ambulatory Visit (HOSPITAL_BASED_OUTPATIENT_CLINIC_OR_DEPARTMENT_OTHER): Payer: BLUE CROSS/BLUE SHIELD

## 2014-07-21 ENCOUNTER — Ambulatory Visit (HOSPITAL_COMMUNITY)
Admission: RE | Admit: 2014-07-21 | Discharge: 2014-07-21 | Disposition: A | Discharge status: Home / Self Care | Payer: BLUE CROSS/BLUE SHIELD | Source: Ambulatory Visit | Attending: Nurse Practitioner | Admitting: Nurse Practitioner

## 2014-07-21 DIAGNOSIS — C182 Malignant neoplasm of ascending colon: Secondary | ICD-10-CM

## 2014-07-21 DIAGNOSIS — Z1509 Genetic susceptibility to other malignant neoplasm: Secondary | ICD-10-CM

## 2014-07-21 DIAGNOSIS — Z85038 Personal history of other malignant neoplasm of large intestine: Secondary | ICD-10-CM | POA: Diagnosis not present

## 2014-07-21 LAB — BASIC METABOLIC PANEL (CC13)
ANION GAP: 12 meq/L — AB (ref 3–11)
BUN: 12 mg/dL (ref 7.0–26.0)
CALCIUM: 9.3 mg/dL (ref 8.4–10.4)
CO2: 27 mEq/L (ref 22–29)
CREATININE: 1.2 mg/dL (ref 0.7–1.3)
Chloride: 99 mEq/L (ref 98–109)
EGFR: 65 mL/min/{1.73_m2} — ABNORMAL LOW (ref >90)
Glucose: 309 mg/dl — ABNORMAL HIGH (ref 70–140)
Potassium: 4.2 mEq/L (ref 3.5–5.1)
SODIUM: 138 meq/L (ref 136–145)

## 2014-07-21 IMAGING — CT CT ABD-PELV W/ CM
2 of 5 series · 16 of 46 positions shown, 18 images · IV contrast (OMNIPAQUE)
Comparison: CT [DATE]

CLINICAL DATA: Stage III colon cancer. Subsequent treatment
strategy. Lynch syndrome.

EXAM:
CT CHEST, ABDOMEN, AND PELVIS WITH CONTRAST
TECHNIQUE: Multidetector CT imaging of the chest, abdomen and pelvis was
performed following the standard protocol during bolus
administration of intravenous contrast.
CONTRAST:  100mL OMNIPAQUE IOHEXOL 300 MG/ML  SOLN

[Series 2: cap with st · axial · 0.85mm/px · z∈[-466,+169]mm · 13 of 145 slices shown, 15 images]
[im 9/145  soft-tissue]
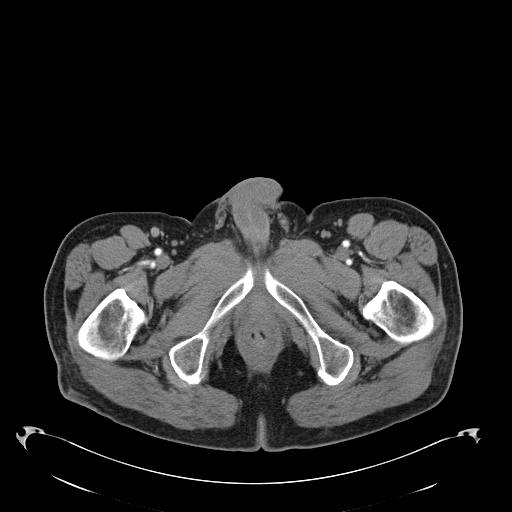
[im 9/145  bone]
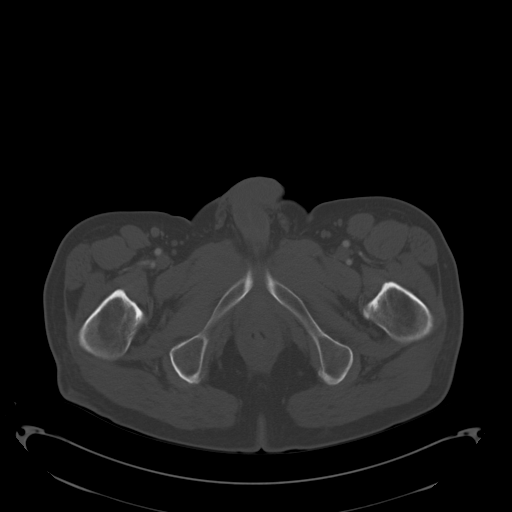
[im 17/145  soft-tissue]
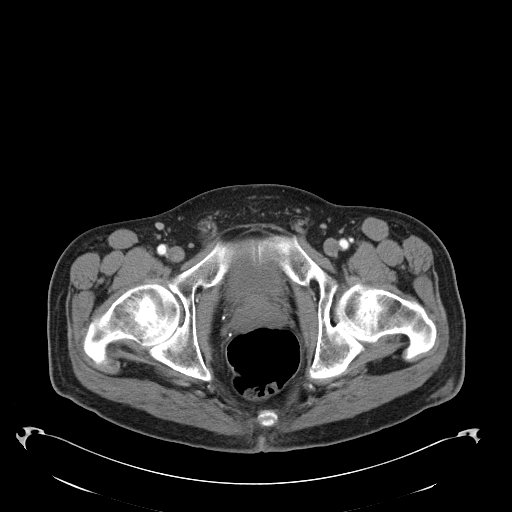
[im 34/145  soft-tissue]
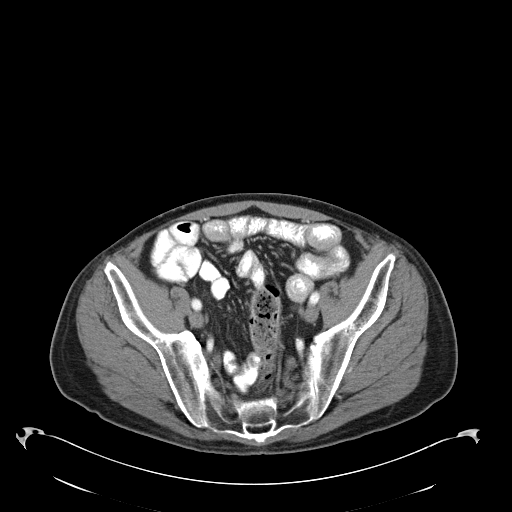
[im 43/145  soft-tissue]
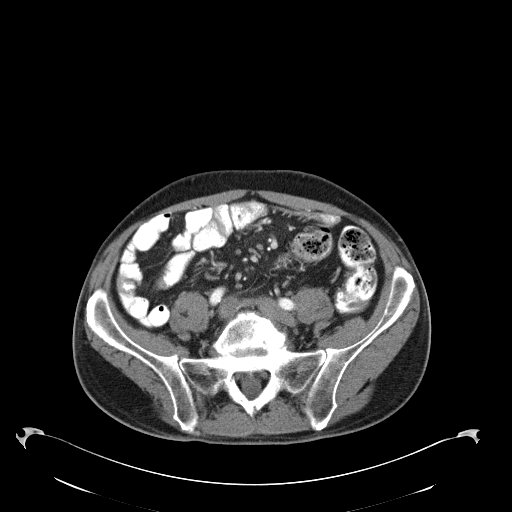
[im 51/145  soft-tissue]
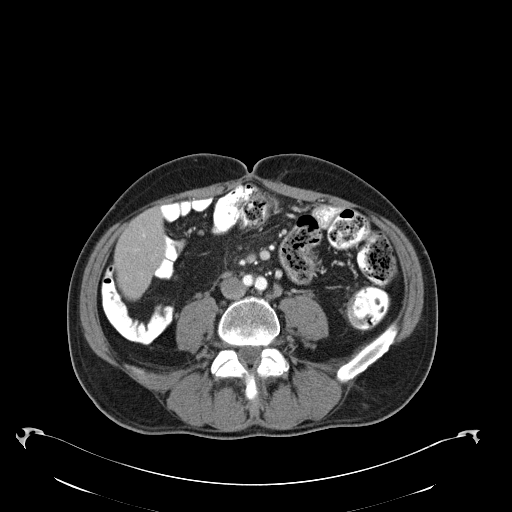
[im 60/145  soft-tissue]
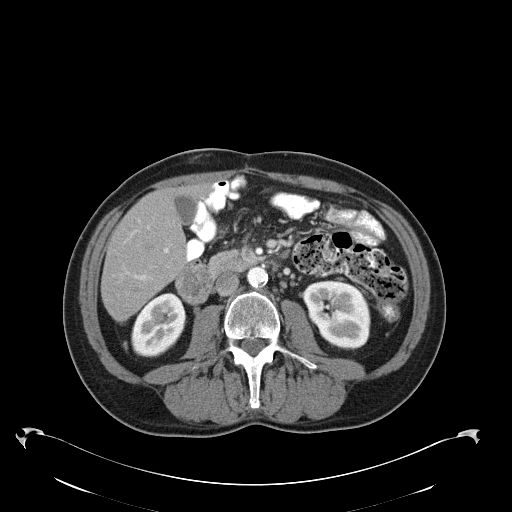
[im 77/145  soft-tissue]
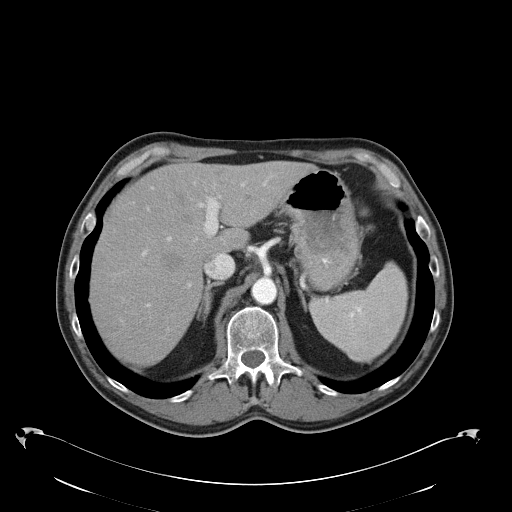
[im 85/145  soft-tissue]
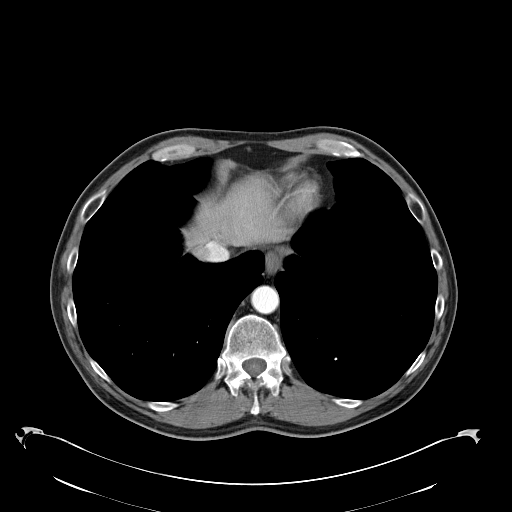
[im 94/145  soft-tissue]
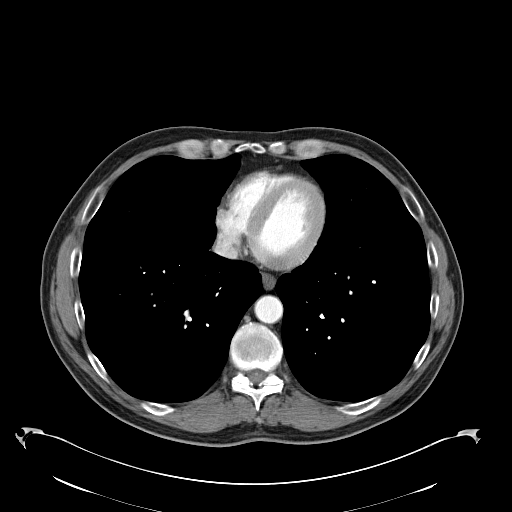
[im 94/145  bone]
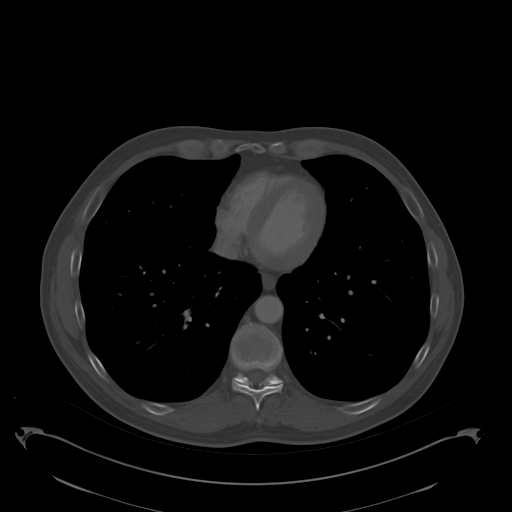
[im 102/145  soft-tissue]
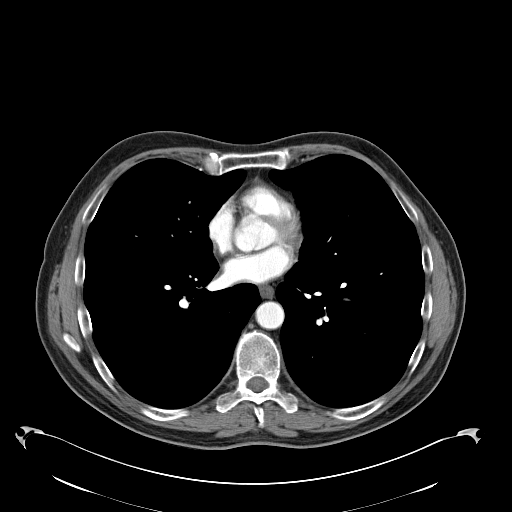
[im 111/145  soft-tissue]
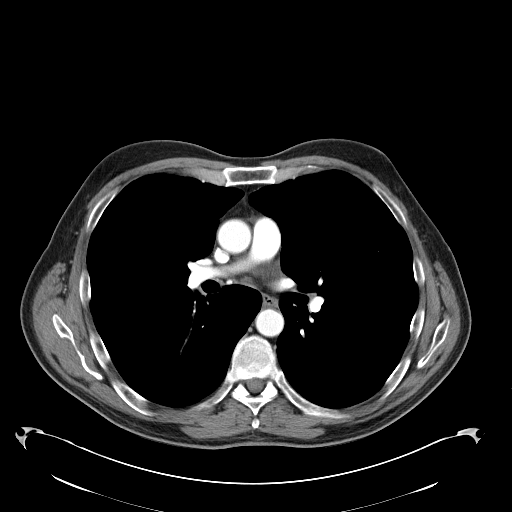
[im 128/145  soft-tissue]
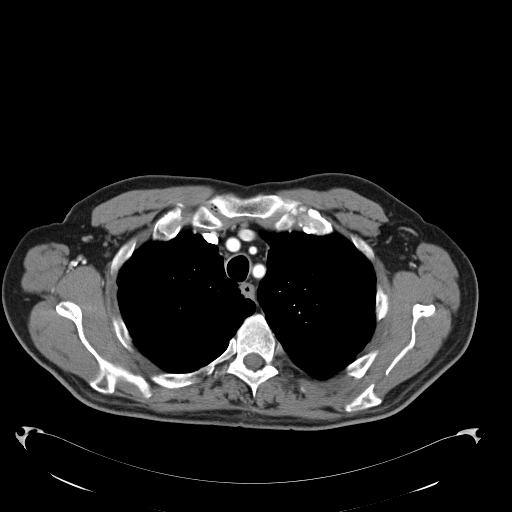
[im 136/145  soft-tissue]
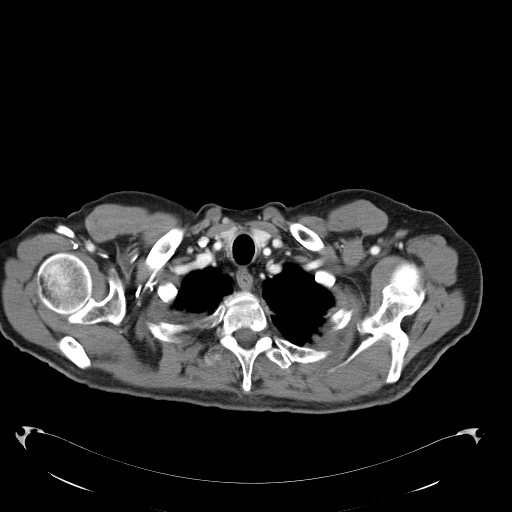

[Series 602: <mpr thick range> · coronal · 1.42mm/px · 3 of 115 slices shown]
[im 39/115  soft-tissue]
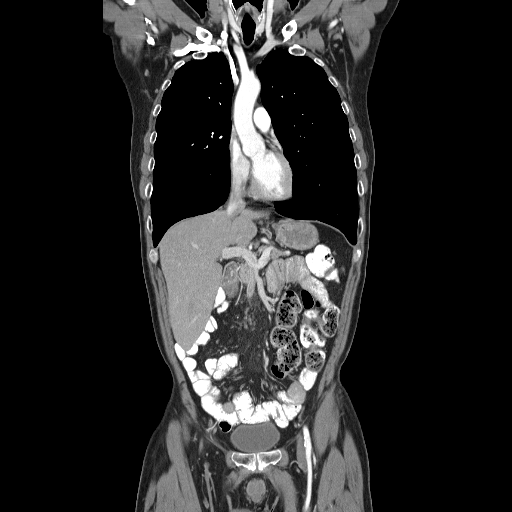
[im 51/115  soft-tissue]
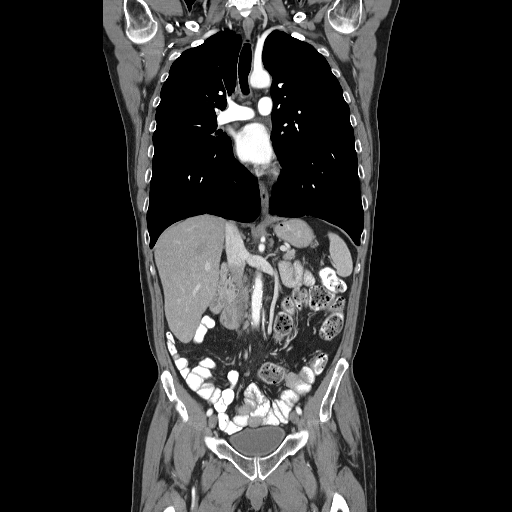
[im 64/115  soft-tissue]
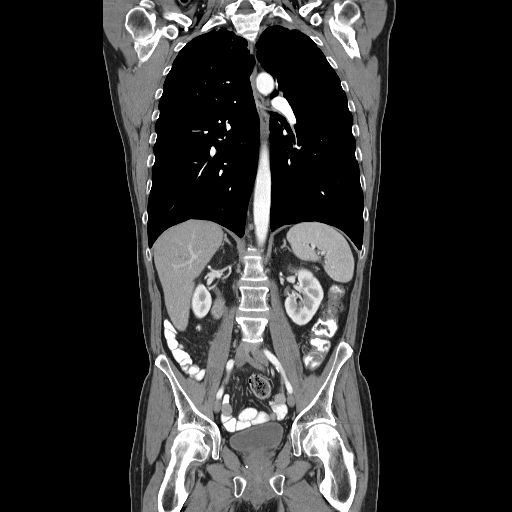

[16 of 46 positions shown; findings below may reference images not displayed]

FINDINGS: CT CHEST FINDINGS

Mediastinum/Nodes: No axillary or supraclavicular lymphadenopathy.
No mediastinal hilar lymphadenopathy. No central pulmonary embolism.
No pericardial fluid

Lungs/Pleura: There is bilateral apical scarring not changed from
prior. Small subpleural nodule in the inferior lingula measuring 4
mm is unchanged.

CT ABDOMEN AND PELVIS FINDINGS

Hepatobiliary: No focal hepatic lesion. No biliary duct dilatation.
Gallbladder is normal. Common bile duct is normal.

Pancreas: Pancreas is normal. No ductal dilatation. No pancreatic
inflammation.

Spleen: Normal spleen.

Adrenals/urinary tract: Adrenal glands and kidneys are normal. The
ureters and bladder normal.

Stomach/Bowel: Stomach, small bowel colon are stable. There is right
hemicolectomy anatomy. No nodularity at the anastomosis. Small
mesenteric lymph nodes in the region anastomosis or less the 5 mm.
Moderate volume stool in the descending colon, sigmoid colon rectum.
No rectal abnormality

Vascular/Lymphatic: Abdominal aorta is normal caliber. There is no
retroperitoneal or periportal lymphadenopathy. No pelvic
lymphadenopathy. Small 6 mm lymph nodes left of the 3 aorta are not
changed. Small retrocrural lymph nodes are stable.

Reproductive: Prostate gland is normal.  No pelvic lymphadenopathy.

Musculoskeletal: No aggressive osseous lesion.

Other: No peritoneal disease.
IMPRESSION: Chest Impression:

No evidence of thoracic metastasis.

Abdomen / Pelvis Impression:

1. Stable right hemicolectomy anatomy.
2. No evidence of local recurrence or metastatic disease in the
abdomen or pelvis.
3. Small lymph nodes adjacent to the colon anastomosis similar
prior.
4. Small lymph nodes along the aorta are also similar prior.

## 2014-07-21 MED ORDER — IOHEXOL 300 MG/ML  SOLN
100.0000 mL | Freq: Once | INTRAMUSCULAR | Status: AC | PRN
  Administered 2014-07-21: 100 mL via INTRAVENOUS

## 2014-07-22 ENCOUNTER — Telehealth: Payer: Self-pay | Admitting: *Deleted

## 2014-07-22 NOTE — Telephone Encounter (Signed)
-----   Message from Ladell Pier, MD sent at 07/22/2014  1:45 PM EDT ----- Please call patient, CT is negative for cancer

## 2014-07-22 NOTE — Telephone Encounter (Addendum)
Called and informed patient that CT is negative for cancer.  Per Dr. Sherrill.  Patient verbalized understanding.   

## 2014-08-27 ENCOUNTER — Ambulatory Visit (AMBULATORY_SURGERY_CENTER): Payer: Self-pay

## 2014-08-27 VITALS — Ht 75.0 in | Wt 169.0 lb

## 2014-08-27 DIAGNOSIS — C189 Malignant neoplasm of colon, unspecified: Secondary | ICD-10-CM

## 2014-08-27 NOTE — Progress Notes (Signed)
No allergies to eggs or soy No diet/weight loss meds No home oxygen No past problems with anesthesia  No email 

## 2014-09-10 ENCOUNTER — Ambulatory Visit (AMBULATORY_SURGERY_CENTER): Payer: BLUE CROSS/BLUE SHIELD | Admitting: Gastroenterology

## 2014-09-10 ENCOUNTER — Encounter: Payer: Self-pay | Admitting: Gastroenterology

## 2014-09-10 VITALS — BP 143/84 | HR 67 | Temp 96.6°F | Resp 15 | Ht 75.0 in | Wt 169.0 lb

## 2014-09-10 DIAGNOSIS — Z85038 Personal history of other malignant neoplasm of large intestine: Secondary | ICD-10-CM

## 2014-09-10 DIAGNOSIS — Z1211 Encounter for screening for malignant neoplasm of colon: Secondary | ICD-10-CM

## 2014-09-10 DIAGNOSIS — Z8509 Personal history of malignant neoplasm of other digestive organs: Secondary | ICD-10-CM | POA: Diagnosis not present

## 2014-09-10 DIAGNOSIS — C189 Malignant neoplasm of colon, unspecified: Secondary | ICD-10-CM

## 2014-09-10 LAB — GLUCOSE, CAPILLARY
GLUCOSE-CAPILLARY: 209 mg/dL — AB (ref 65–99)
GLUCOSE-CAPILLARY: 194 mg/dL — AB (ref 65–99)

## 2014-09-10 MED ORDER — SODIUM CHLORIDE 0.9 % IV SOLN: 500.0000 mL | INTRAVENOUS | Status: DC

## 2014-09-10 NOTE — Patient Instructions (Signed)
YOU HAD AN ENDOSCOPIC PROCEDURE TODAY AT THE [facility]:   Refer to the procedure report that was given to you for any specific questions about what was found during the examination.  If the procedure report does not answer your questions, please call your gastroenterologist to clarify.  If you requested that your care partner not be given the details of your procedure findings, then the procedure report has been included in a sealed envelope for you to review at your convenience later.  YOU SHOULD EXPECT: Some feelings of bloating in the abdomen. Passage of more gas than usual.  Walking can help get rid of the air that was put into your GI tract during the procedure and reduce the bloating. If you had a lower endoscopy (such as a colonoscopy or flexible sigmoidoscopy) you may notice spotting of blood in your stool or on the toilet paper. If you underwent a bowel prep for your procedure, you may not have a normal bowel movement for a few days.  Please Note:  You might notice some irritation and congestion in your nose or some drainage.  This is from the oxygen used during your procedure.  There is no need for concern and it should clear up in a day or so.  SYMPTOMS TO REPORT IMMEDIATELY:   Following lower endoscopy (colonoscopy or flexible sigmoidoscopy):  Excessive amounts of blood in the stool  Significant tenderness or worsening of abdominal pains  Swelling of the abdomen that is new, acute  Fever of 100F or higher   For urgent or emergent issues, a gastroenterologist can be reached at any hour by calling [phone].   DIET: Your first meal following the procedure should be a small meal and then it is ok to progress to your normal diet. Heavy or fried foods are harder to digest and may make you feel nauseous or bloated.  Likewise, meals heavy in dairy and vegetables can increase bloating.  Drink plenty of fluids but you should avoid alcoholic beverages for 24  hours.  ACTIVITY:  You should plan to take it easy for the rest of today and you should NOT DRIVE or use heavy machinery until tomorrow (because of the sedation medicines used during the test).    FOLLOW UP: Our staff will call the number listed on your records the next business day following your procedure to check on you and address any questions or concerns that you may have regarding the information given to you following your procedure. If we do not reach you, we will leave a message.  However, if you are feeling well and you are not experiencing any problems, there is no need to return our call.  We will assume that you have returned to your regular daily activities without incident.  If any biopsies were taken you will be contacted by phone or by letter within the next 1-3 weeks.  Please call us at [phone] if you have not heard about the biopsies in 3 weeks.    SIGNATURES/CONFIDENTIALITY: You and/or your care partner have signed paperwork which will be entered into your electronic medical record.  These signatures attest to the fact that that the information above on your After Visit Summary has been reviewed and is understood.  Full responsibility of the confidentiality of this discharge information lies with you and/or your care-partner. 

## 2014-09-10 NOTE — Op Note (Signed)
[facility]  [address]. [place], [address]   COLONOSCOPY PROCEDURE REPORT  PATIENT: Greg Robertson, Greg Robertson  MR#: [id number] BIRTHDATE: August 04, 1950 , 80  yrs. old GENDER: male ENDOSCOPIST: Inda Castle, MD REFERRED WN:IOEV Edrick Kins, M.D. PROCEDURE DATE:  09/10/2014 PROCEDURE:   Colonoscopy, surveillance First Screening Colonoscopy - Avg.  risk and is 50 yrs.  old or older - No.  Prior Negative Screening - Now for repeat screening. Above average risk Prior Negative Screening - Now for repeat screening.  Other: See Comments  History of Adenoma - Now for follow-up colonoscopy & has been > or = to 3 yrs.  N/A  Polyps removed today? No Recommend repeat exam, <10 yrs? Yes other ASA CLASS:   Class II INDICATIONS:PH Colon or Rectal Adenocarcinoma. 2014 MEDICATIONS: Monitored anesthesia care and Propofol 150 mg IV  DESCRIPTION OF PROCEDURE:   After the risks benefits and alternatives of the procedure were thoroughly explained, informed consent was obtained.  The digital rectal exam revealed no abnormalities of the rectum.   The LB CF-[id number] Loaner [id number] endoscope was introduced through the anus and advanced to the surgical anastomosis. No adverse events experienced.   The quality of the prep was (Suprep was used) excellent.  The instrument was then slowly withdrawn as the colon was fully examined. Estimated blood loss is zero unless otherwise noted in this procedure report.      COLON FINDINGS: A normal appearing cecum, ileocecal valve, and appendiceal orifice were identified.  The ascending, transverse, descending, sigmoid colon, and rectum appeared unremarkable. Retroflexed views revealed no abnormalities. The time to cecum = 2.8 Withdrawal time = 4.9   The scope was withdrawn and the procedure completed. COMPLICATIONS: There were no immediate complications.  ENDOSCOPIC IMPRESSION: Normal colonoscopy  RECOMMENDATIONS: Colonoscopy  3 years  eSigned:   Inda Castle, MD 09/10/2014 10:30 AM   cc:

## 2014-09-10 NOTE — Progress Notes (Signed)
Transferred to recovery room. A/O x3, pleased with MAC.  VSS.  Report to Penny, RN. 

## 2014-09-11 ENCOUNTER — Telehealth: Payer: Self-pay | Admitting: *Deleted

## 2014-09-11 NOTE — Telephone Encounter (Signed)
  Follow up Call-  Call back number 09/10/2014 06/06/2012  Post procedure Call Back phone  # [phone]  Permission to leave phone message Yes Yes     Patient questions:  Do you have a fever, pain , or abdominal swelling? No. Pain Score  0 *  Have you tolerated food without any problems? Yes.    Have you been able to return to your normal activities? Yes.    Do you have any questions about your discharge instructions: Diet   No. Medications  No. Follow up visit  No.  Do you have questions or concerns about your Care? No.  Actions: * If pain score is 4 or above: No action needed, pain <4.

## 2014-12-10 ENCOUNTER — Ambulatory Visit: Payer: BLUE CROSS/BLUE SHIELD | Admitting: Family Medicine

## 2014-12-12 ENCOUNTER — Encounter: Payer: Self-pay | Admitting: Family Medicine

## 2014-12-14 ENCOUNTER — Other Ambulatory Visit: Payer: Self-pay | Admitting: Family Medicine

## 2015-01-09 ENCOUNTER — Telehealth: Payer: Self-pay | Admitting: *Deleted

## 2015-01-09 ENCOUNTER — Telehealth: Payer: Self-pay | Admitting: Oncology

## 2015-01-09 ENCOUNTER — Ambulatory Visit (HOSPITAL_BASED_OUTPATIENT_CLINIC_OR_DEPARTMENT_OTHER): Payer: BLUE CROSS/BLUE SHIELD | Admitting: Oncology

## 2015-01-09 ENCOUNTER — Other Ambulatory Visit: Payer: BLUE CROSS/BLUE SHIELD

## 2015-01-09 VITALS — BP 148/71 | HR 79 | Temp 97.8°F | Resp 17 | Ht 75.0 in | Wt 174.2 lb

## 2015-01-09 DIAGNOSIS — Z1509 Genetic susceptibility to other malignant neoplasm: Secondary | ICD-10-CM

## 2015-01-09 DIAGNOSIS — D500 Iron deficiency anemia secondary to blood loss (chronic): Secondary | ICD-10-CM

## 2015-01-09 DIAGNOSIS — C182 Malignant neoplasm of ascending colon: Secondary | ICD-10-CM

## 2015-01-09 NOTE — Telephone Encounter (Signed)
Per Dr. Benay Spice; notified pt that MD wants him to stop taking ferrous sulfate pill; office with re-check CBC in 6 months.  Pt verbalized understanding and expressed appreciation.

## 2015-01-09 NOTE — Telephone Encounter (Signed)
GAVE ADN PRINTED APPT SCHED AND AVS FOR PT FOR mARCH 2017

## 2015-01-09 NOTE — Progress Notes (Signed)
  [name]   Diagnosis: Colon cancer  INTERVAL HISTORY:   Greg Robertson returns as scheduled. He feels well. No complaint. He underwent a colonoscopy 09/10/2014 by Dr. Deatra Ina. The colonoscopy was normal.  Objective:  Vital signs in last 24 hours:  Blood pressure 148/71, pulse 79, temperature 97.8 F (36.6 C), temperature source Oral, resp. rate 17, height _0  (1.905 m), weight 174 lb 3.2 oz (79.017 kg), SpO2 100 %.    HEENT: Neck without mass Lymphatics: No cervical, supra-clavicular, axillary, or inguinal nodes Resp: Lungs clear bilaterally Cardio: Regular rate and rhythm GI: No hepatomegaly, no mass, nontender Vascular: No leg edema   Lab Results:  Lab Results  Component Value Date   WBC 4.8 12/03/2013   HGB 15.9 12/03/2013   HCT 48.7 12/03/2013   MCV 95.5 12/03/2013   PLT 152 04/07/2013   NEUTROABS 12.8* 04/06/2013      Lab Results  Component Value Date   CEA 3.2 07/10/2014    Medications: I have reviewed the patient's current medications.  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 Tumor microsatellite unstable, K-ras wild type. Adjuvant FOLFOX chemotherapy initiated on 07/14/2011. Cycle 12 given on 01/05/2012.  -Negative surveillance colonoscopy 06/06/2012  -Negative surveillance CT scans 04/25/2013 -Negative surveillance CT scans 07/21/2014 -Negative surveillance colonoscopy 09/10/2014 2. Microcytic anemia. Likely secondary to iron deficiency. Resolved. 3. Insulin-dependent diabetes. 4. Glaucoma. 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 He has seen a Retail buyer. He has been contacted with updated information regarding screening recommendations. 6. Status post  Port-A-Cath placement 07/01/2011. The Port-A-Cath has been removed. 7. History of thrombocytopenia secondary to chemotherapy. Oxaliplatin was held with cycle 3. Oxaliplatin was resumed with cycle 4 at a 25% dose reduction. Oxaliplatin was further dose reduced beginning with cycle 6 due to cytopenias. The oxaliplatin was held with cycle 8, cycle 10 and cycle 11. 8. History of neutropenia secondary to chemotherapy.    Disposition:  Greg Robertson remains in clinical remission from colon cancer. We will follow-up on the CEA from today. He will return for an office visit and CEA in 6 months. He is still taking iron. He had a normal hemoglobin in August 2015. We will recommend he discontinue iron therapy.  Betsy Coder, MD  01/09/2015  10:34 AM

## 2015-01-10 LAB — CEA: CEA: 3.4 ng/mL (ref 0.0–5.0)

## 2015-01-13 ENCOUNTER — Telehealth: Payer: Self-pay | Admitting: *Deleted

## 2015-01-13 NOTE — Telephone Encounter (Signed)
Pt made aware of results and appreciated call. No other needs identified at this time.

## 2015-01-13 NOTE — Telephone Encounter (Signed)
-----   Message from Ladell Pier, MD sent at 01/12/2015  2:57 PM EDT ----- Please call patient, cea is normal, f/u as scheduled

## 2015-01-21 ENCOUNTER — Encounter: Payer: Self-pay | Admitting: Family Medicine

## 2015-01-21 ENCOUNTER — Ambulatory Visit (INDEPENDENT_AMBULATORY_CARE_PROVIDER_SITE_OTHER): Payer: BLUE CROSS/BLUE SHIELD | Admitting: Family Medicine

## 2015-01-21 VITALS — BP 152/75 | HR 64 | Temp 97.3°F | Ht 75.0 in | Wt 173.0 lb

## 2015-01-21 DIAGNOSIS — E1365 Other specified diabetes mellitus with hyperglycemia: Secondary | ICD-10-CM

## 2015-01-21 DIAGNOSIS — E559 Vitamin D deficiency, unspecified: Secondary | ICD-10-CM

## 2015-01-21 DIAGNOSIS — I4891 Unspecified atrial fibrillation: Secondary | ICD-10-CM | POA: Diagnosis not present

## 2015-01-21 DIAGNOSIS — IMO0002 Reserved for concepts with insufficient information to code with codable children: Secondary | ICD-10-CM

## 2015-01-21 DIAGNOSIS — I214 Non-ST elevation (NSTEMI) myocardial infarction: Secondary | ICD-10-CM | POA: Diagnosis not present

## 2015-01-21 DIAGNOSIS — E785 Hyperlipidemia, unspecified: Secondary | ICD-10-CM

## 2015-01-21 DIAGNOSIS — E1100 Type 2 diabetes mellitus with hyperosmolarity without nonketotic hyperglycemic-hyperosmolar coma (NKHHC): Secondary | ICD-10-CM

## 2015-01-21 DIAGNOSIS — N400 Enlarged prostate without lower urinary tract symptoms: Secondary | ICD-10-CM

## 2015-01-21 DIAGNOSIS — I10 Essential (primary) hypertension: Secondary | ICD-10-CM | POA: Diagnosis not present

## 2015-01-21 DIAGNOSIS — I739 Peripheral vascular disease, unspecified: Secondary | ICD-10-CM

## 2015-01-21 DIAGNOSIS — I480 Paroxysmal atrial fibrillation: Secondary | ICD-10-CM | POA: Diagnosis not present

## 2015-01-21 DIAGNOSIS — C182 Malignant neoplasm of ascending colon: Secondary | ICD-10-CM

## 2015-01-21 DIAGNOSIS — E0810 Diabetes mellitus due to underlying condition with ketoacidosis without coma: Secondary | ICD-10-CM | POA: Diagnosis not present

## 2015-01-21 DIAGNOSIS — Z0000 Encounter for general adult medical examination without abnormal findings: Secondary | ICD-10-CM | POA: Diagnosis not present

## 2015-01-21 DIAGNOSIS — Z23 Encounter for immunization: Secondary | ICD-10-CM | POA: Diagnosis not present

## 2015-01-21 DIAGNOSIS — Z794 Long term (current) use of insulin: Secondary | ICD-10-CM

## 2015-01-21 LAB — POCT UA - MICROSCOPIC ONLY
Bacteria, U Microscopic: NEGATIVE
Casts, Ur, LPF, POC: NEGATIVE
Crystals, Ur, HPF, POC: NEGATIVE
MUCUS UA: NEGATIVE
RBC, urine, microscopic: NEGATIVE
WBC, Ur, HPF, POC: NEGATIVE
YEAST UA: NEGATIVE

## 2015-01-21 LAB — POCT URINALYSIS DIPSTICK
BILIRUBIN UA: NEGATIVE
Blood, UA: NEGATIVE
Glucose, UA: 1000
Ketones, UA: NEGATIVE
Leukocytes, UA: NEGATIVE
Nitrite, UA: NEGATIVE
Protein, UA: NEGATIVE
Spec Grav, UA: 1.015
UROBILINOGEN UA: NEGATIVE
pH, UA: 7

## 2015-01-21 LAB — POCT UA - MICROALBUMIN: Microalbumin Ur, POC: NEGATIVE mg/L

## 2015-01-21 LAB — POCT GLYCOSYLATED HEMOGLOBIN (HGB A1C): Hemoglobin A1C: 10.9

## 2015-01-21 MED ORDER — INSULIN GLARGINE 100 UNIT/ML ~~LOC~~ SOLN: SUBCUTANEOUS | Status: DC

## 2015-01-21 MED ORDER — INSULIN ASPART 100 UNIT/ML ~~LOC~~ SOLN: SUBCUTANEOUS | Status: DC

## 2015-01-21 NOTE — Patient Instructions (Addendum)
Continue current medications. Continue good therapeutic lifestyle changes which include good diet and exercise. Fall precautions discussed with patient. If an FOBT was given today- please return it to our front desk. If you are over 64 years old - you may need Prevnar 69 or the adult Pneumonia vaccine. Check cost of Prevnar 13 and ZOSTAVAX (shingles shot) with you insurance company.   **Flu shots will be available soon--- please call and schedule a FLU-CLINIC appointment**  After your visit with [place] today you will receive a survey in the mail or online from [employer] regarding your care with [place]. Please take a moment to fill this out. Your feedback is very important to [place] as you can help [place] better understand your patient needs as well as improve your experience and satisfaction. WE CARE ABOUT YOU!!!    the patient should reduce his grams per NovoLog injection as discussed  He should monitor his blood sugars closely  We will call him with a lab work results as soon as those results become available and if necessary schedule him for a visit with the clinical pharmacists to further evaluate his was sugar control  He should monitor his feet closely  He should check his blood pressure periodically at home

## 2015-01-21 NOTE — Progress Notes (Signed)
Subjective:    Patient ID: Greg Robertson, male    DOB: 25-Oct-1950, 64 y.o.   MRN: [id number]  HPI Patient is here today for annual wellness exam and follow up of chronic medical problems which includes hypertension and diabetes. He is taking medications regularly. This patient has a long history of multiple medical problems the worst being his insulin-dependent diabetes and difficult to control it as tightly as possible. He has a history of colon cancer and has recently had a normal colonoscopy by Dr. Deatra Ina in May of this year. It was recommended his next colonoscopy due done in 3 years. He also has a history of atrial fibrillation and this occurred when he was sick with nausea and vomiting and had an episode of diabetic ketoacidosis. The patient is alert and in good spirits today. He indicates that his blood sugars at home and been averaging about 202 and this is with less tight control and he is thinking about doing 1 unit of NovoLog per 6 g instead of per 7 g. He knows he has to watch his blood sugars more closely with this. He denies chest pain shortness of breath or problems with palpitations. His swallowing his food without problems Lelon Frohlich has no nausea vomiting diarrhea and has not noticed any blood in the stool. His passing his water without issues.     Patient Active Problem List   Diagnosis Date Noted  . Hyperlipidemia 12/12/2013  . Nausea and vomiting 04/06/2013  . Diabetes 1.5, managed as type 1 ([place]) 10/29/2012  . HTN (hypertension) 03/14/2012  . Chronic diastolic heart failure, NYHA class 1 ([place]) 03/12/2012  . Demand ischemia ([place]) 03/12/2012  . Fracture of left clavicle-distal 03/12/2012  . NSTEMI (non-ST elevated myocardial infarction) ([place]) 03/11/2012  . Atrial fibrillation with RVR ([place]) 03/11/2012  . Diabetic ketoacidosis ([place]) 03/10/2012  . Dehydration, severe 03/10/2012  . Right bundle branch block 03/10/2012  . Atrial fibrillation by electrocardiogram ([place])  03/10/2012  . Elevated troponin I level 03/10/2012  . Lynch syndrome 07/28/2011  . Stage III (T3 N1) disease poorly differentiated adenocarcinoma of the right colon status post right colectomy on 06/02/2011 05/25/2011  . Nonspecific abnormal finding in stool contents 05/10/2011  . Diabetes mellitus type 2, uncontrolled, without complications ([place]) 90/30/0923  . Glaucoma 05/10/2011   Outpatient Encounter Prescriptions as of 01/21/2015  Medication Sig  . acetaminophen (TYLENOL) 500 MG tablet Take 1,000 mg by mouth every 6 (six) hours as needed. Pain   . Ascorbic Acid (VITAMIN C PO) Take 1 tablet by mouth every morning.   Marland Kitchen aspirin EC 81 MG tablet Take 81 mg by mouth daily.  . brimonidine-timolol (COMBIGAN) 0.2-0.5 % ophthalmic solution Place 1 drop into both eyes every 12 (twelve) hours.   Marland Kitchen glucose blood test strip Use as instructed  . LANTUS 100 UNIT/ML injection INJECT 24 UNITS INTO THE SKIN ONCE A DAY AT BEDTIME AS INSTRUCTED  . latanoprost (XALATAN) 0.005 % ophthalmic solution Place 1 drop into both eyes at bedtime.  Marland Kitchen levocetirizine (XYZAL) 5 MG tablet TAKE ONE TABLET BY MOUTH ONE TIME DAILY  . lisinopril (PRINIVIL,ZESTRIL) 2.5 MG tablet Take 1 tablet (2.5 mg total) by mouth daily.  . Multiple Vitamin (MULTIVITAMIN) tablet Take 1 tablet by mouth every morning.   Marland Kitchen NOVOLOG 100 UNIT/ML injection INJECT 3-5 UNITS INTO THE SKIN THREE TIMES A DAY BEFORE MEALS (TAKE 1 UNIT FOR EVERY 6GMS OF CARBS)  . simvastatin (ZOCOR) 40 MG tablet Take 1 tablet (40 mg total)  by mouth at bedtime.  . triamcinolone (NASACORT) 55 MCG/ACT AERO nasal inhaler INSTILL 1 SPRAY IN EACH NOSTRIL ONCE OR TWICE DAILY AS NEEDED.  Marland Kitchen VITAMIN D, CHOLECALCIFEROL, PO Take 2,000 Units by mouth every morning. Vitamin D 3  . [DISCONTINUED] ferrous sulfate 325 (65 FE) MG tablet Take 325 mg by mouth 2 (two) times daily with a meal.   No facility-administered encounter medications on file as of 01/21/2015.      Review of Systems    Constitutional: Negative.   HENT: Negative.   Eyes: Negative.   Respiratory: Negative.   Cardiovascular: Negative.   Gastrointestinal: Negative.   Endocrine: Negative.        Elevated BS  Genitourinary: Negative.   Musculoskeletal: Negative.   Skin: Negative.   Allergic/Immunologic: Negative.   Neurological: Negative.   Hematological: Negative.   Psychiatric/Behavioral: Negative.        Objective:   Physical Exam  Constitutional: He is oriented to person, place, and time. He appears well-developed and well-nourished. No distress.  HENT:  Head: Normocephalic and atraumatic.  Right Ear: External ear normal.  Left Ear: External ear normal.  Mouth/Throat: Oropharynx is clear and moist. No oropharyngeal exudate.  Some nasal congestion bilaterally  Eyes: Conjunctivae and EOM are normal. Pupils are equal, round, and reactive to light. Right eye exhibits no discharge. Left eye exhibits no discharge. No scleral icterus.  Neck: Normal range of motion. Neck supple. No tracheal deviation present. No thyromegaly present.  Neck without bruits thyromegaly or anterior cervical adenopathy  Cardiovascular: Normal rate, regular rhythm and normal heart sounds.   No murmur heard. At 72/m. Decreased pedal pulses on the left compared to the right  Pulmonary/Chest: Effort normal and breath sounds normal. No respiratory distress. He has no wheezes. He has no rales. He exhibits no tenderness.  Clear anteriorly and posteriorly  Abdominal: Soft. Bowel sounds are normal. He exhibits no mass. There is no tenderness. There is no rebound and no guarding.  No abdominal tenderness masses or inguinal adenopathy  Genitourinary: Rectum normal and penis normal.  The prostate gland was slightly enlarged but without lumps or irregularities. The rectal exam was negative. The external genitalia were normal and there were no inguinal hernias palpable  Musculoskeletal: Normal range of motion. He exhibits no edema or  tenderness.  Lymphadenopathy:    He has no cervical adenopathy.  Neurological: He is alert and oriented to person, place, and time. He has normal reflexes. No cranial nerve deficit.  Skin: Skin is warm and dry. No rash noted.  The patient has nail tinea on both great toenails  Psychiatric: He has a normal mood and affect. His behavior is normal. Judgment and thought content normal.  Nursing note and vitals reviewed.  EKG: Within normal limits normal sinus rhythm    BP 152/75 mmHg  Pulse 64  Temp(Src) 97.3 F (36.3 C) (Oral)  Ht _0  (1.905 m)  Wt 173 lb (78.472 kg)  BMI 21.62 kg/m2   Repeat blood pressure 150/84 sitting regular cuff right arm    Assessment & Plan:  1. Essential hypertension -Blood pressure is elevated today systolically and we will have the patient come back in a few weeks to have this rechecked and bring some home blood pressures with him at the time when he visits with the clinical pharmacist - BMP8+EGFR - CBC with Differential/Platelet - Hepatic function panel - EKG 12-Lead  2. BPH (benign prostatic hyperplasia) -Prostate is slightly enlarged but the patient is having no  symptoms with this. - CBC with Differential/Platelet - POCT UA - Microscopic Only - POCT urinalysis dipstick - PSA  3. Atrial fibrillation with RVR ([place]) -According to the patient he is had no further episodes of atrial fibrillation or heart irregularity that he is aware of this occurred when he was sick with diabetic ketoacidosis. - CBC with Differential/Platelet - EKG 12-Lead  4. Hyperlipidemia -He should continue with his simvastatin and with as aggressive therapeutic lifestyle changes as possible - CBC with Differential/Platelet - Hepatic function panel - NMR, lipoprofile - EKG 12-Lead  5. 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  This was discussed during the visit today - POCT glycosylated hemoglobin (Hb A1C) - BMP8+EGFR - CBC with Differential/Platelet - POCT UA - Microalbumin  6. Vitamin D deficiency -Continue current treatment pending results of lab work - CBC with Differential/Platelet - Vit D  25 hydroxy (rtn osteoporosis monitoring)  7. Annual physical exam -Patient will get a Prevnar vaccine today along with his flu shot. - POCT glycosylated hemoglobin (Hb A1C) - BMP8+EGFR - CBC with Differential/Platelet - Hepatic function panel - NMR, lipoprofile - Vit D  25 hydroxy (rtn osteoporosis monitoring) - POCT UA - Microscopic Only - POCT urinalysis dipstick - PSA - EKG 12-Lead  8. Diabetic ketoacidosis without coma associated with diabetes mellitus due to underlying condition ([place]) -He's had no further episodes of diabetic ketoacidosis since his last hospitalization.  9. Malignant neoplasm of ascending colon [facility]) -He had a recent colonoscopy in May and this was clear in the next one is planned for 3 years from that time.  10. Non-ST elevation myocardial infarction (NSTEMI) ([place]) -He's had no further episodes of chest pain and has not been back to the cardiologist since this occurred during his hospitalization with a ketoacidosis.  11. Paroxysmal atrial fibrillation ([facility]) -To his knowledge he has not had any other episodes of atrial fibrillation and or lightheadedness.  12. Uncontrolled type 2 diabetes mellitus with hyperosmolarity without coma, with long-term current use of insulin ([place]) -The blood sugar is still difficult to control and we will wait to the lab work is back to determine how much more controlled we need at this point in time.  13. Peripheral vascular insufficiency  Meds ordered this encounter  Medications  . insulin glargine (LANTUS) 100 UNIT/ML injection    Sig: INJECT 24 UNITS INTO THE SKIN ONCE A DAY AT BEDTIME AS INSTRUCTED    Dispense:  10 mL    Refill:  11    Must be  seen before next refill  . insulin aspart (NOVOLOG) 100 UNIT/ML injection    Sig: INJECT 3-5 UNITS INTO THE SKIN THREE TIMES A DAY BEFORE MEALS (TAKE 1 UNIT FOR EVERY 6GMS OF CARBS)    Dispense:  10 mL    Refill:  11   Patient Instructions  Continue current medications. Continue good therapeutic lifestyle changes which include good diet and exercise. Fall precautions discussed with patient. If an FOBT was given today- please return it to our front desk. If you are over 1 years old - you may need Prevnar 51 or the adult Pneumonia vaccine. Check cost of Prevnar 13 and ZOSTAVAX (shingles shot) with you insurance company.   **Flu shots will be available soon--- please call and schedule a FLU-CLINIC appointment**  After your visit with [place] today you will receive a  survey in the mail or online from [employer] regarding your care with [place]. Please take a moment to fill this out. Your feedback is very important to [place] as you can help [place] better understand your patient needs as well as improve your experience and satisfaction. WE CARE ABOUT YOU!!!    the patient should reduce his grams per NovoLog injection as discussed  He should monitor his blood sugars closely  We will call him with a lab work results as soon as those results become available and if necessary schedule him for a visit with the clinical pharmacists to further evaluate his was sugar control  He should monitor his feet closely  He should check his blood pressure periodically at home   Arrie Senate MD

## 2015-01-21 NOTE — Addendum Note (Signed)
Addended by: Zannie Cove on: 01/21/2015 11:38 AM   Modules accepted: Orders

## 2015-01-22 ENCOUNTER — Encounter: Payer: Self-pay | Admitting: Family Medicine

## 2015-01-22 DIAGNOSIS — D696 Thrombocytopenia, unspecified: Secondary | ICD-10-CM | POA: Insufficient documentation

## 2015-01-22 LAB — CBC WITH DIFFERENTIAL/PLATELET
BASOS: 0 %
Basophils Absolute: 0 10*3/uL (ref 0.0–0.2)
EOS (ABSOLUTE): 0.1 10*3/uL (ref 0.0–0.4)
EOS: 3 %
HEMATOCRIT: 42.4 % (ref 37.5–51.0)
Hemoglobin: 14.2 g/dL (ref 12.6–17.7)
IMMATURE GRANS (ABS): 0 10*3/uL (ref 0.0–0.1)
Immature Granulocytes: 0 %
LYMPHS: 43 %
Lymphocytes Absolute: 2.1 10*3/uL (ref 0.7–3.1)
MCH: 30.9 pg (ref 26.6–33.0)
MCHC: 33.5 g/dL (ref 31.5–35.7)
MCV: 92 fL (ref 79–97)
MONOCYTES: 10 %
Monocytes Absolute: 0.5 10*3/uL (ref 0.1–0.9)
Neutrophils Absolute: 2.2 10*3/uL (ref 1.4–7.0)
Neutrophils: 44 %
Platelets: 139 10*3/uL — ABNORMAL LOW (ref 150–379)
RBC: 4.59 x10E6/uL (ref 4.14–5.80)
RDW: 12.6 % (ref 12.3–15.4)
WBC: 4.9 10*3/uL (ref 3.4–10.8)

## 2015-01-22 LAB — NMR, LIPOPROFILE
Cholesterol: 227 mg/dL — ABNORMAL HIGH (ref 100–199)
HDL Cholesterol by NMR: 63 mg/dL (ref >39)
HDL PARTICLE NUMBER: 35.4 umol/L (ref >=30.5)
LDL Particle Number: 1537 nmol/L — ABNORMAL HIGH (ref <1000)
LDL Size: 21.5 nm (ref >20.5)
LDL-C: 124 mg/dL — ABNORMAL HIGH (ref 0–99)
LP-IR Score: 33 (ref <=45)
SMALL LDL PARTICLE NUMBER: 446 nmol/L (ref <=527)
TRIGLYCERIDES BY NMR: 200 mg/dL — AB (ref 0–149)

## 2015-01-22 LAB — HEPATIC FUNCTION PANEL
ALT: 22 IU/L (ref 0–44)
AST: 21 IU/L (ref 0–40)
Albumin: 4.5 g/dL (ref 3.6–4.8)
Alkaline Phosphatase: 108 IU/L (ref 39–117)
BILIRUBIN TOTAL: 0.3 mg/dL (ref 0.0–1.2)
Bilirubin, Direct: 0.1 mg/dL (ref 0.00–0.40)
Total Protein: 6.2 g/dL (ref 6.0–8.5)

## 2015-01-22 LAB — BMP8+EGFR
BUN/Creatinine Ratio: 13 (ref 10–22)
BUN: 14 mg/dL (ref 8–27)
CALCIUM: 9.3 mg/dL (ref 8.6–10.2)
CO2: 29 mmol/L (ref 18–29)
CREATININE: 1.06 mg/dL (ref 0.76–1.27)
Chloride: 103 mmol/L (ref 97–108)
GFR calc Af Amer: 86 mL/min/{1.73_m2} (ref >59)
GFR calc non Af Amer: 74 mL/min/{1.73_m2} (ref >59)
Glucose: 123 mg/dL — ABNORMAL HIGH (ref 65–99)
Potassium: 4.9 mmol/L (ref 3.5–5.2)
Sodium: 144 mmol/L (ref 134–144)

## 2015-01-22 LAB — VITAMIN D 25 HYDROXY (VIT D DEFICIENCY, FRACTURES): VIT D 25 HYDROXY: 13.5 ng/mL — AB (ref 30.0–100.0)

## 2015-01-22 LAB — PSA: Prostate Specific Ag, Serum: 2 ng/mL (ref 0.0–4.0)

## 2015-02-09 ENCOUNTER — Encounter: Payer: Self-pay | Admitting: Pharmacist

## 2015-02-09 ENCOUNTER — Ambulatory Visit (INDEPENDENT_AMBULATORY_CARE_PROVIDER_SITE_OTHER): Payer: BLUE CROSS/BLUE SHIELD | Admitting: Pharmacist

## 2015-02-09 VITALS — BP 148/90 | HR 75 | Ht 75.0 in | Wt 173.0 lb

## 2015-02-09 DIAGNOSIS — Z9114 Patient's other noncompliance with medication regimen: Secondary | ICD-10-CM | POA: Diagnosis not present

## 2015-02-09 DIAGNOSIS — E559 Vitamin D deficiency, unspecified: Secondary | ICD-10-CM | POA: Diagnosis not present

## 2015-02-09 DIAGNOSIS — E785 Hyperlipidemia, unspecified: Secondary | ICD-10-CM | POA: Diagnosis not present

## 2015-02-09 DIAGNOSIS — E1065 Type 1 diabetes mellitus with hyperglycemia: Secondary | ICD-10-CM

## 2015-02-09 DIAGNOSIS — I10 Essential (primary) hypertension: Secondary | ICD-10-CM

## 2015-02-09 MED ORDER — VITAMIN D (ERGOCALCIFEROL) 1.25 MG (50000 UNIT) PO CAPS: 50000.0000 [IU] | ORAL_CAPSULE | ORAL | Status: DC

## 2015-02-09 MED ORDER — INSULIN ASPART 100 UNIT/ML ~~LOC~~ SOLN: SUBCUTANEOUS | Status: DC

## 2015-02-09 MED ORDER — SIMVASTATIN 40 MG PO TABS: 40.0000 mg | ORAL_TABLET | Freq: Every day | ORAL | Status: DC

## 2015-02-09 MED ORDER — LISINOPRIL 2.5 MG PO TABS: 2.5000 mg | ORAL_TABLET | Freq: Every day | ORAL | Status: DC

## 2015-02-09 MED ORDER — INSULIN GLARGINE 100 UNIT/ML ~~LOC~~ SOLN: 30.0000 [IU] | Freq: Every day | SUBCUTANEOUS | Status: DC

## 2015-02-09 NOTE — Patient Instructions (Signed)
RESTART LISINOPRIL AND SIMVASTATIN INCREASE LANTUS TO 30 UNITS ONCE  A DAY CHANGE INSULIN TO CARB RATIO TO 1 UNIT TO 6 GRAMS OF CARBS CONTINUE CORRECTION OF 1 UNIT FOR EVERY 50 POINTS OVER 100.   Diabetes and Standards of Medical Care   Diabetes is complicated. You may find that your diabetes team includes a dietitian, nurse, diabetes educator, eye doctor, and more. To help everyone know what is going on and to help you get the care you deserve, the following schedule of care was developed to help keep you on track. Below are the tests, exams, vaccines, medicines, education, and plans you will need.  Blood Glucose Goals Prior to meals = 80 - 130 Within 2 hours of the start of a meal = less than 180  HbA1c test (goal is less than 7.0% - your last value was 10.2%) This test shows how well you have controlled your glucose over the past 2 to 3 months. It is used to see if your diabetes management plan needs to be adjusted.   It is performed at least 2 times a year if you are meeting treatment goals.  It is performed 4 times a year if therapy has changed or if you are not meeting treatment goals.  Blood pressure test  This test is performed at every routine medical visit. The goal is less than 140/90 mmHg for most people, but 130/80 mmHg in some cases. Ask your health care provider about your goal.  Dental exam  Follow up with the dentist regularly.  Eye exam  If you are diagnosed with type 1 diabetes as a child, get an exam upon reaching the age of 26 years or older and have had diabetes for 3 to 5 years. Yearly eye exams are recommended after that initial eye exam.  If you are diagnosed with type 1 diabetes as an adult, get an exam within 5 years of diagnosis and then yearly.  If you are diagnosed with type 2 diabetes, get an exam as soon as possible after the diagnosis and then yearly.  Foot care exam  Visual foot exams are performed at every routine medical visit. The exams  check for cuts, injuries, or other problems with the feet.  A comprehensive foot exam should be done yearly. This includes visual inspection as well as assessing foot pulses and testing for loss of sensation.  Check your feet nightly for cuts, injuries, or other problems with your feet. Tell your health care provider if anything is not healing.  Kidney function test (urine microalbumin)  This test is performed once a year.  Type 1 diabetes: The first test is performed 5 years after diagnosis.  Type 2 diabetes: The first test is performed at the time of diagnosis.  A serum creatinine and estimated glomerular filtration rate (eGFR) test is done once a year to assess the level of chronic kidney disease (CKD), if present.  Lipid profile (cholesterol, HDL, LDL, triglycerides)  Performed every 5 years for most people.  The goal for LDL is less than 100 mg/dL. If you are at high risk, the goal is less than 70 mg/dL.  The goal for HDL is 40 mg/dL to 50 mg/dL for men and 50 mg/dL to 60 mg/dL for women. An HDL cholesterol of 60 mg/dL or higher gives some protection against heart disease.  The goal for triglycerides is less than 150 mg/dL.  Influenza vaccine, pneumococcal vaccine, and hepatitis B vaccine  The influenza vaccine is recommended yearly.  The pneumococcal vaccine is generally given once in a lifetime. However, there are some instances when another vaccination is recommended. Check with your health care provider.  The hepatitis B vaccine is also recommended for adults with diabetes.  Diabetes self-management education  Education is recommended at diagnosis and ongoing as needed.  Treatment plan  Your treatment plan is reviewed at every medical visit.  Document Released: 01/30/2009 Document Revised: 12/05/2012 Document Reviewed: 09/04/2012 [facility] Patient Information 2014 [place].

## 2015-02-09 NOTE — Progress Notes (Signed)
Subjective:      Greg Robertson is a 64 y.o. 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  The initial diagnosis of diabetes was made several years ago.    His clinical course has fluctuated. Insulin dosage review with Elvin suggested noncompliance and not checking BG often enough - usually only once or twice a day.   Associated symptoms of hyperglycemia have been none.  Associated symptoms of hypoglycemia have been dizziness and sweating.   He is currently taking Novolog prior to each meal - uses CHO Ratio of 1 unit of insulin per every 7 grams of CHO with a correction factor of 50 (gives 1 unit of insulin per each 50 points BG is greater than 100) and Lantus 28 units units at bedtime.  Insulin injections are given by patient.   Compliance with blood glucose monitoring: Still only checking 1-2 times daily - instructions are to tid / prior to each meal.    14 day BG avg = 225 30 day BG avg = 197 Am - 82, 422, 359, 182, 106 Noon - 260, 146, 73, 125 Pm - 1239, 281, 163, 74, 176, 150, 76 The patient does perform independently. Rotation of sites for injection: abdominal wall Exercise: every other day  Meal panning: He is using carbohydrate counting but in reviewing is need for further education.   Objective:    BP 148/90 mmHg  Pulse 75  Ht 6\' 3"  (1.905 m)  Wt 173 lb (78.472 kg)  BMI 21.62 kg/m2   Lab Review      hemoglobin A1C - 10.9% (01/21/2015)      LDL = 124 (01/21/2015 )      Tg = 200 (01/21/2015)    Assessment:    Diabetes Mellitus type I, under poor control.  Hyperlipidemia - patient has been non compliant with statin HTN - non compliance with ACE inhibitor. Low vitamin D    Plan:    1.  RX changes: Increase Lantus to 30 units once a day  Rx for Novolog updated  Change Insulin to CHO ratio to 1:6  Continue same correction factor of 50  Restart simvastatin and lisinopril  Rx for vitamin D 50, 000 iu weekly sent to pharmacy 2.   Education: Reviewed carbohydrate counting - especially potato and rice serving sizes.   Reviewed interpretation of lab results, blood sugar goals,  hypoglycemia prevention and treatment, self-monitoring of blood glucose skills, use of sliding scale/correction formula and use of insulin: carb ratio 3.  Compliance at present is estimated to be poor - long discussion made regarding the importance of taking medications regularly and about long term complications for uncontrolled BG, lipids and BP. 4.  Increase checking BG prior to each meal   5.  Patient needs urine protein/creatinin ratio - he could not void in office today so was given cup to RTO  6.  Follow up: 1 month to adjust insulin and review compliance.  Cherre Robins, PharmD, CPP, CDE

## 2015-03-16 ENCOUNTER — Ambulatory Visit: Payer: Self-pay | Admitting: Pharmacist

## 2015-04-06 ENCOUNTER — Encounter: Payer: BLUE CROSS/BLUE SHIELD | Admitting: Family Medicine

## 2015-04-06 ENCOUNTER — Encounter: Payer: Self-pay | Admitting: Family Medicine

## 2015-04-06 NOTE — Progress Notes (Signed)
   Subjective:    Patient ID: Greg Robertson, male    DOB: [birth year], 64 y.o.   MRN: [id number]  HPI    Review of Systems     Objective:   Physical Exam        Assessment & Plan:  This encounter was created in error - please disregard.

## 2015-04-06 NOTE — Progress Notes (Deleted)
Subjective:    Patient ID: Greg Robertson, male    DOB: [birth year], 64 y.o.   MRN: [id number]  HPI Patient here today for [facility] renewal.      Patient Active Problem List   Diagnosis Date Noted  . Thrombocytopenia ([place]) 01/22/2015  . Hyperlipidemia 12/12/2013  . Nausea and vomiting 04/06/2013  . 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 HTN (hypertension) 03/14/2012  . Chronic diastolic heart failure, NYHA class 1 ([place]) 03/12/2012  . Demand ischemia ([place]) 03/12/2012  . Fracture of left clavicle-distal 03/12/2012  . NSTEMI (non-ST elevated myocardial infarction) ([place]) 03/11/2012  . Atrial fibrillation with RVR ([place]) 03/11/2012  . Diabetic ketoacidosis ([place]) 03/10/2012  . Dehydration, severe 03/10/2012  . Right bundle branch block 03/10/2012  . Atrial fibrillation by electrocardiogram ([place]) 03/10/2012  . Elevated troponin I level 03/10/2012  . Lynch syndrome 07/28/2011  . Stage III (T3 N1) disease poorly differentiated adenocarcinoma of the right colon status post right colectomy on 06/02/2011 05/25/2011  . Nonspecific abnormal finding in stool contents 05/10/2011  . Diabetes mellitus type 2, uncontrolled, without complications ([place]) [id number]  . Glaucoma 05/10/2011   Outpatient Encounter Prescriptions as of 04/06/2015  Medication Sig  . acetaminophen (TYLENOL) 500 MG tablet Take 1,000 mg by mouth every 6 (six) hours as needed. Pain   . Ascorbic Acid (VITAMIN C PO) Take 1 tablet by mouth every morning.   Marland Kitchen aspirin EC 81 MG tablet Take 81 mg by mouth daily.  . brimonidine-timolol (COMBIGAN) 0.2-0.5 % ophthalmic solution Place 1 drop into both eyes every 12 (twelve) hours.   Marland Kitchen glucose blood test strip Use as instructed  . insulin aspart (NOVOLOG) 100 UNIT/ML injection INJECT 8 TO 15 UNITS INTO THE SKIN THREE TIMES A DAY BEFORE MEALS (TAKE 1 UNIT FOR EVERY 6GMS OF CARBS plus CORRECTION FACTOR OF 1 UNIT PER 50)  . insulin glargine (LANTUS) 100 UNIT/ML injection  Inject 0.3 mLs (30 Units total) into the skin at bedtime. INJECT 24 UNITS INTO THE SKIN ONCE A DAY AT BEDTIME AS INSTRUCTED  . latanoprost (XALATAN) 0.005 % ophthalmic solution Place 1 drop into both eyes at bedtime.  Marland Kitchen levocetirizine (XYZAL) 5 MG tablet TAKE ONE TABLET BY MOUTH ONE TIME DAILY  . lisinopril (PRINIVIL,ZESTRIL) 2.5 MG tablet Take 1 tablet (2.5 mg total) by mouth daily.  . Multiple Vitamin (MULTIVITAMIN) tablet Take 1 tablet by mouth every morning.   . simvastatin (ZOCOR) 40 MG tablet Take 1 tablet (40 mg total) by mouth at bedtime.  . triamcinolone (NASACORT) 55 MCG/ACT AERO nasal inhaler INSTILL 1 SPRAY IN EACH NOSTRIL ONCE OR TWICE DAILY AS NEEDED.  . Vitamin D, Ergocalciferol, (DRISDOL) 50000 UNITS CAPS capsule Take 1 capsule (50,000 Units total) by mouth every 7 (seven) days.   No facility-administered encounter medications on file as of 04/06/2015.      Review of Systems  Constitutional: Negative.   HENT: Negative.   Eyes: Negative.   Respiratory: Negative.   Cardiovascular: Negative.   Gastrointestinal: Negative.   Endocrine: Negative.   Genitourinary: Negative.   Musculoskeletal: Negative.   Skin: Negative.   Allergic/Immunologic: Negative.   Neurological: Negative.   Hematological: Negative.   Psychiatric/Behavioral: Negative.        Objective:   Physical Exam BP 139/82 mmHg  Pulse 82  Temp(Src) 97 F (36.1 C) (Oral)  Ht 6\' 3"  (1.905 m)  Wt 175 lb (79.379 kg)  BMI 21.87 kg/m2        Assessment &  Plan:

## 2015-05-01 ENCOUNTER — Encounter: Payer: Self-pay | Admitting: Family Medicine

## 2015-05-01 ENCOUNTER — Ambulatory Visit (INDEPENDENT_AMBULATORY_CARE_PROVIDER_SITE_OTHER): Payer: BLUE CROSS/BLUE SHIELD | Admitting: Family Medicine

## 2015-05-01 VITALS — BP 136/82 | HR 84 | Temp 99.0°F | Ht 75.0 in | Wt 172.2 lb

## 2015-05-01 DIAGNOSIS — J019 Acute sinusitis, unspecified: Secondary | ICD-10-CM | POA: Diagnosis not present

## 2015-05-01 MED ORDER — AZITHROMYCIN 250 MG PO TABS: ORAL_TABLET | ORAL | Status: DC

## 2015-05-01 NOTE — Progress Notes (Signed)
BP 136/82 mmHg  Pulse 84  Temp(Src) 99 F (37.2 C) (Oral)  Ht 6\' 3"  (1.905 m)  Wt 172 lb 3.2 oz (78.109 kg)  BMI 21.52 kg/m2   Subjective:    Patient ID: Greg Robertson, male    DOB: [birth year], 65 y.o.   MRN: [id number]  HPI: Greg Robertson is a 65 y.o. male presenting on 05/01/2015 for Cough and Sinusitis   HPI Cough and sinus congestion Patient has had cough and sinus congestion that began about 2 weeks ago. He was coughing up a lot of phlegm initially and now it has become more dry. He is feels like is having a lot of postnasal drainage and sinus pressure but no shortness of breath or wheezing or fevers or chills. He has been using levocetirizine and a nasal steroid spray and felt like it was getting better on these but that is worsened over the past couple days. Specifically what is worse in his postnasal drainage and the cough.  Relevant past medical, surgical, family and social history reviewed and updated as indicated. Interim medical history since our last visit reviewed. Allergies and medications reviewed and updated.  Review of Systems  Constitutional: Negative for fever and chills.  HENT: Positive for postnasal drip, rhinorrhea, sinus pressure and sore throat. Negative for congestion, ear discharge, ear pain, sneezing and voice change.   Eyes: Negative for pain, discharge, redness and visual disturbance.  Respiratory: Positive for cough. Negative for shortness of breath and wheezing.   Cardiovascular: Negative for chest pain and leg swelling.  Gastrointestinal: Negative for abdominal pain, diarrhea and constipation.  Genitourinary: Negative for difficulty urinating.  Musculoskeletal: Negative for back pain and gait problem.  Skin: Negative for rash.  Neurological: Negative for syncope, light-headedness and headaches.  All other systems reviewed and are negative.   Per HPI unless specifically indicated above     Medication List       This list is  accurate as of: 05/01/15  7:05 PM.  Always use your most recent med list.               acetaminophen 500 MG tablet  Commonly known as:  TYLENOL  Take 1,000 mg by mouth every 6 (six) hours as needed. Pain     aspirin EC 81 MG tablet  Take 81 mg by mouth daily.     azithromycin 250 MG tablet  Commonly known as:  ZITHROMAX  Take 2 the first day and then one each day after.     brimonidine 0.1 % Soln  Commonly known as:  ALPHAGAN P     glucose blood test strip  Use as instructed     insulin aspart 100 UNIT/ML injection  Commonly known as:  NOVOLOG  INJECT 8 TO 15 UNITS INTO THE SKIN THREE TIMES A DAY BEFORE MEALS (TAKE 1 UNIT FOR EVERY 6GMS OF CARBS plus CORRECTION FACTOR OF 1 UNIT PER 50)     insulin glargine 100 UNIT/ML injection  Commonly known as:  LANTUS  Inject 0.3 mLs (30 Units total) into the skin at bedtime. INJECT 24 UNITS INTO THE SKIN ONCE A DAY AT BEDTIME AS INSTRUCTED     latanoprost 0.005 % ophthalmic solution  Commonly known as:  XALATAN  Place 1 drop into both eyes at bedtime.     levocetirizine 5 MG tablet  Commonly known as:  XYZAL  TAKE ONE TABLET BY MOUTH ONE TIME DAILY     lisinopril 2.5 MG tablet  Commonly  known as:  PRINIVIL,ZESTRIL  Take 1 tablet (2.5 mg total) by mouth daily.     multivitamin tablet  Take 1 tablet by mouth every morning.     simvastatin 40 MG tablet  Commonly known as:  ZOCOR  Take 1 tablet (40 mg total) by mouth at bedtime.     timolol 0.25 % ophthalmic solution  Commonly known as:  TIMOPTIC  1 drop 2 (two) times daily.     triamcinolone 55 MCG/ACT Aero nasal inhaler  Commonly known as:  NASACORT  INSTILL 1 SPRAY IN EACH NOSTRIL ONCE OR TWICE DAILY AS NEEDED.     VITAMIN C PO  Take 1 tablet by mouth every morning.     Vitamin D (Ergocalciferol) 50000 units Caps capsule  Commonly known as:  DRISDOL  Take 1 capsule (50,000 Units total) by mouth every 7 (seven) days.     Vitamin D 2000 units Caps  Take by mouth.            Objective:    BP 136/82 mmHg  Pulse 84  Temp(Src) 99 F (37.2 C) (Oral)  Ht 6\' 3"  (1.905 m)  Wt 172 lb 3.2 oz (78.109 kg)  BMI 21.52 kg/m2  Wt Readings from Last 3 Encounters:  05/01/15 172 lb 3.2 oz (78.109 kg)  04/06/15 175 lb (79.379 kg)  02/09/15 173 lb (78.472 kg)    Physical Exam  Constitutional: He is oriented to person, place, and time. He appears well-developed and well-nourished. No distress.  HENT:  Right Ear: Tympanic membrane, external ear and ear canal normal.  Left Ear: Tympanic membrane, external ear and ear canal normal.  Nose: Mucosal edema and rhinorrhea present. No sinus tenderness. No epistaxis. Right sinus exhibits no maxillary sinus tenderness and no frontal sinus tenderness. Left sinus exhibits no maxillary sinus tenderness and no frontal sinus tenderness.  Mouth/Throat: Uvula is midline and mucous membranes are normal. Posterior oropharyngeal edema and posterior oropharyngeal erythema present. No oropharyngeal exudate or tonsillar abscesses.  Eyes: Conjunctivae and EOM are normal. Pupils are equal, round, and reactive to light. Right eye exhibits no discharge. No scleral icterus.  Neck: Neck supple. No thyromegaly present.  Cardiovascular: Normal rate, regular rhythm, normal heart sounds and intact distal pulses.   No murmur heard. Pulmonary/Chest: Effort normal and breath sounds normal. No respiratory distress. He has no wheezes.  Abdominal: He exhibits no distension.  Musculoskeletal: Normal range of motion. He exhibits no edema.  Lymphadenopathy:    He has no cervical adenopathy.  Neurological: He is alert and oriented to person, place, and time. Coordination normal.  Skin: Skin is warm and dry. No rash noted. He is not diaphoretic.  Psychiatric: He has a normal mood and affect. His behavior is normal.  Vitals reviewed.     Assessment & Plan:   Problem List Items Addressed This Visit    None    Visit Diagnoses    Acute rhinosinusitis     -  Primary    Continue nasal steroid spray and antihistamine and add azithromycin    Relevant Medications    azithromycin (ZITHROMAX) 250 MG tablet        Follow up plan: Return if symptoms worsen or fail to improve.  Counseling provided for all of the vaccine components No orders of the defined types were placed in this encounter.    Caryl Pina, MD [facility] 05/01/2015, 7:05 PM

## 2015-05-18 LAB — HM DIABETES EYE EXAM

## 2015-07-03 ENCOUNTER — Other Ambulatory Visit: Payer: Self-pay

## 2015-07-03 ENCOUNTER — Other Ambulatory Visit: Payer: Self-pay | Admitting: Family Medicine

## 2015-07-03 MED ORDER — INSULIN ASPART 100 UNIT/ML ~~LOC~~ SOLN: SUBCUTANEOUS | Status: DC

## 2015-07-03 MED ORDER — INSULIN GLARGINE 100 UNIT/ML ~~LOC~~ SOLN: 30.0000 [IU] | Freq: Every day | SUBCUTANEOUS | Status: DC

## 2015-07-09 ENCOUNTER — Ambulatory Visit (HOSPITAL_BASED_OUTPATIENT_CLINIC_OR_DEPARTMENT_OTHER): Payer: BLUE CROSS/BLUE SHIELD | Admitting: Nurse Practitioner

## 2015-07-09 ENCOUNTER — Other Ambulatory Visit (HOSPITAL_BASED_OUTPATIENT_CLINIC_OR_DEPARTMENT_OTHER): Payer: BLUE CROSS/BLUE SHIELD

## 2015-07-09 ENCOUNTER — Telehealth: Payer: Self-pay | Admitting: Nurse Practitioner

## 2015-07-09 VITALS — BP 153/78 | HR 73 | Temp 97.8°F | Resp 19 | Ht 75.0 in | Wt 169.7 lb

## 2015-07-09 DIAGNOSIS — E119 Type 2 diabetes mellitus without complications: Secondary | ICD-10-CM | POA: Diagnosis not present

## 2015-07-09 DIAGNOSIS — C182 Malignant neoplasm of ascending colon: Secondary | ICD-10-CM

## 2015-07-09 DIAGNOSIS — Z1509 Genetic susceptibility to other malignant neoplasm: Secondary | ICD-10-CM

## 2015-07-09 DIAGNOSIS — D72819 Decreased white blood cell count, unspecified: Secondary | ICD-10-CM

## 2015-07-09 DIAGNOSIS — D696 Thrombocytopenia, unspecified: Secondary | ICD-10-CM | POA: Diagnosis not present

## 2015-07-09 DIAGNOSIS — D500 Iron deficiency anemia secondary to blood loss (chronic): Secondary | ICD-10-CM

## 2015-07-09 DIAGNOSIS — Z85038 Personal history of other malignant neoplasm of large intestine: Secondary | ICD-10-CM

## 2015-07-09 LAB — CBC WITH DIFFERENTIAL/PLATELET
BASO%: 0.8 % (ref 0.0–2.0)
BASOS ABS: 0 10*3/uL (ref 0.0–0.1)
EOS%: 2 % (ref 0.0–7.0)
Eosinophils Absolute: 0.1 10*3/uL (ref 0.0–0.5)
HEMATOCRIT: 44.2 % (ref 38.4–49.9)
HGB: 14.6 g/dL (ref 13.0–17.1)
LYMPH#: 1 10*3/uL (ref 0.9–3.3)
LYMPH%: 29.5 % (ref 14.0–49.0)
MCH: 31.2 pg (ref 27.2–33.4)
MCHC: 33 g/dL (ref 32.0–36.0)
MCV: 94.5 fL (ref 79.3–98.0)
MONO#: 0.3 10*3/uL (ref 0.1–0.9)
MONO%: 10 % (ref 0.0–14.0)
NEUT#: 2 10*3/uL (ref 1.5–6.5)
NEUT%: 57.7 % (ref 39.0–75.0)
PLATELETS: 114 10*3/uL — AB (ref 140–400)
RBC: 4.68 10*6/uL (ref 4.20–5.82)
RDW: 13.9 % (ref 11.0–14.6)
WBC: 3.4 10*3/uL — ABNORMAL LOW (ref 4.0–10.3)

## 2015-07-09 NOTE — Telephone Encounter (Signed)
per pof to sch pt appt-gave pt copy of avs °

## 2015-07-09 NOTE — Progress Notes (Signed)
  Greg Robertson   Diagnosis:  Colon cancer  INTERVAL HISTORY:   Greg Robertson returns as scheduled. He feels well. No change in bowel habits. No bloody or black stools. No abdominal pain. He has a good appetite.  Objective:  Vital signs in last 24 hours:  Blood pressure 153/78, pulse 73, temperature 97.8 F (36.6 C), temperature source Oral, resp. rate 19, height '6\' 3"'$  (1.905 m), weight 169 lb 11.2 oz (76.975 kg), SpO2 100 %.    HEENT: No thrush or ulcers. Lymphatics: No palpable cervical, supra clavicular, axillary or inguinal lymph nodes. Resp: Lungs clear bilaterally. Cardio: Regular rate and rhythm. GI: Abdomen soft and nontender. No organomegaly. No mass. Vascular: No leg edema.   Lab Results:  Lab Results  Component Value Date   WBC 3.4* 07/09/2015   HGB 14.6 07/09/2015   HCT 44.2 07/09/2015   MCV 94.5 07/09/2015   PLT 114* 07/09/2015   NEUTROABS 2.0 07/09/2015    Imaging:  No results found.  Medications: I have reviewed the patient's current medications.  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 Tumor microsatellite unstable, K-ras wild type. Adjuvant FOLFOX chemotherapy initiated on 07/14/2011. Cycle 12 given on 01/05/2012.  -Negative surveillance colonoscopy 06/06/2012  -Negative surveillance CT scans 04/25/2013 -Negative surveillance CT scans 07/21/2014 -Negative surveillance colonoscopy 09/10/2014 2. Microcytic anemia. Likely secondary to iron deficiency. Resolved. 3. Insulin-dependent diabetes. 4. Glaucoma. 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 He has seen a Retail buyer. He has been contacted with updated information regarding screening  recommendations. 6. Status post Port-A-Cath placement 07/01/2011. The Port-A-Cath has been removed. 7. History of thrombocytopenia secondary to chemotherapy. Oxaliplatin was held with cycle 3. Oxaliplatin was resumed with cycle 4 at a 25% dose reduction. Oxaliplatin was further dose reduced beginning with cycle 6 due to cytopenias. The oxaliplatin was held with cycle 8, cycle 10 and cycle 11. 8. History of neutropenia secondary to chemotherapy.   Disposition: Greg Robertson remains in clinical remission from colon cancer. We will follow-up on the CEA from today.  Labs from today show mild thrombocytopenia and mild leukopenia. He will return in 3 months for a follow-up CBC.  We scheduled a return visit and labs in 6 months. He will contact the office in the interim with any problems.  Plan reviewed with Greg Robertson.    Greg Robertson ANP/GNP-BC   07/09/2015  11:07 AM

## 2015-07-10 LAB — CEA: CEA: 5.9 ng/mL — ABNORMAL HIGH (ref 0.0–4.7)

## 2015-07-10 LAB — CEA (PARALLEL TESTING): CEA: 4 ng/mL — ABNORMAL HIGH

## 2015-07-16 ENCOUNTER — Telehealth: Payer: Self-pay | Admitting: Nurse Practitioner

## 2015-07-16 ENCOUNTER — Telehealth: Payer: Self-pay | Admitting: Oncology

## 2015-07-16 ENCOUNTER — Other Ambulatory Visit: Payer: Self-pay | Admitting: Nurse Practitioner

## 2015-07-16 DIAGNOSIS — C182 Malignant neoplasm of ascending colon: Secondary | ICD-10-CM

## 2015-07-16 NOTE — Telephone Encounter (Signed)
s.w. pt and advised on April appt.....pt ok and aware °

## 2015-07-16 NOTE — Telephone Encounter (Signed)
I notified Greg Robertson the CEA was mildly elevated on recent labs. Dr. Benay Spice would like to have the CEA repeated in one month. He is agreeable with this plan. Order entered and POF submitted.

## 2015-07-23 ENCOUNTER — Ambulatory Visit (INDEPENDENT_AMBULATORY_CARE_PROVIDER_SITE_OTHER): Payer: BLUE CROSS/BLUE SHIELD | Admitting: Family Medicine

## 2015-07-23 ENCOUNTER — Encounter (INDEPENDENT_AMBULATORY_CARE_PROVIDER_SITE_OTHER): Payer: Self-pay

## 2015-07-23 ENCOUNTER — Encounter: Payer: Self-pay | Admitting: Family Medicine

## 2015-07-23 VITALS — BP 115/66 | HR 82 | Temp 97.6°F | Ht 75.0 in | Wt 172.0 lb

## 2015-07-23 DIAGNOSIS — E785 Hyperlipidemia, unspecified: Secondary | ICD-10-CM

## 2015-07-23 DIAGNOSIS — Z23 Encounter for immunization: Secondary | ICD-10-CM

## 2015-07-23 DIAGNOSIS — C182 Malignant neoplasm of ascending colon: Secondary | ICD-10-CM

## 2015-07-23 DIAGNOSIS — I10 Essential (primary) hypertension: Secondary | ICD-10-CM | POA: Diagnosis not present

## 2015-07-23 DIAGNOSIS — I214 Non-ST elevation (NSTEMI) myocardial infarction: Secondary | ICD-10-CM | POA: Diagnosis not present

## 2015-07-23 DIAGNOSIS — D696 Thrombocytopenia, unspecified: Secondary | ICD-10-CM | POA: Diagnosis not present

## 2015-07-23 DIAGNOSIS — E1069 Type 1 diabetes mellitus with other specified complication: Secondary | ICD-10-CM

## 2015-07-23 DIAGNOSIS — I4891 Unspecified atrial fibrillation: Secondary | ICD-10-CM

## 2015-07-23 DIAGNOSIS — N400 Enlarged prostate without lower urinary tract symptoms: Secondary | ICD-10-CM | POA: Diagnosis not present

## 2015-07-23 DIAGNOSIS — IMO0001 Reserved for inherently not codable concepts without codable children: Secondary | ICD-10-CM

## 2015-07-23 DIAGNOSIS — Z794 Long term (current) use of insulin: Secondary | ICD-10-CM

## 2015-07-23 DIAGNOSIS — E1165 Type 2 diabetes mellitus with hyperglycemia: Secondary | ICD-10-CM

## 2015-07-23 DIAGNOSIS — E1065 Type 1 diabetes mellitus with hyperglycemia: Secondary | ICD-10-CM | POA: Diagnosis not present

## 2015-07-23 DIAGNOSIS — Z1211 Encounter for screening for malignant neoplasm of colon: Secondary | ICD-10-CM | POA: Diagnosis not present

## 2015-07-23 DIAGNOSIS — E559 Vitamin D deficiency, unspecified: Secondary | ICD-10-CM | POA: Diagnosis not present

## 2015-07-23 LAB — BAYER DCA HB A1C WAIVED: HB A1C: 10.2 % — AB (ref <7.0)

## 2015-07-23 MED ORDER — INSULIN GLARGINE 100 UNIT/ML ~~LOC~~ SOLN: 30.0000 [IU] | Freq: Every day | SUBCUTANEOUS | Status: DC

## 2015-07-23 MED ORDER — INSULIN ASPART 100 UNIT/ML ~~LOC~~ SOLN: SUBCUTANEOUS | Status: DC

## 2015-07-23 NOTE — Patient Instructions (Addendum)
Medicare Annual Wellness Visit  [facility] and the medical providers at [facility] strive to bring you the best medical care.  In doing so we not only want to address your current medical conditions and concerns but also to detect new conditions early and prevent illness, disease and health-related problems.    Medicare offers a yearly Wellness Visit which allows our clinical staff to assess your need for preventative services including immunizations, lifestyle education, counseling to decrease risk of preventable diseases and screening for fall risk and other medical concerns.    This visit is provided free of charge (no copay) for all Medicare recipients. The clinical pharmacists at [facility] have begun to conduct these Wellness Visits which will also include a thorough review of all your medications.    As you primary medical provider recommend that you make an appointment for your Annual Wellness Visit if you have not done so already this year.  You may set up this appointment before you leave today or you may call back [phone]) and schedule an appointment.  Please make sure when you call that you mention that you are scheduling your Annual Wellness Visit with the clinical pharmacist so that the appointment may be made for the proper length of time.     Continue current medications. Continue good therapeutic lifestyle changes which include good diet and exercise. Fall precautions discussed with patient. If an FOBT was given today- please return it to our front desk. If you are over 49 years old - you may need Prevnar 64 or the adult Pneumonia vaccine.  **Flu shots are available--- please call and schedule a FLU-CLINIC appointment**  After your visit with [place] today you will receive a survey in the mail or online from [employer] regarding your care with [place]. Please take a moment to fill this out. Your feedback is very  important to [place] as you can help [place] better understand your patient needs as well as improve your experience and satisfaction. WE CARE ABOUT YOU!!!   Follow-up with oncology as planned Make an appointment with the podiatrist for nail trimming Monitor blood sugars closely--call the clinical pharmacists anytime if there is any question about blood sugars or control If sick with a virus, please come to the doctor's office as soon as possible

## 2015-07-23 NOTE — Progress Notes (Signed)
Subjective:    Patient ID: Greg Robertson, male    DOB: [birth year], 65 y.o.   MRN: [id number]  HPI Pt here for follow up and management of chronic medical problems which includes diabetes and hypertension. He is taking medications regularly.The patient recently saw the oncologist and his CEA was up from the previous CEA. The lab that the oncologist was using had changed in their standards abnormal may have changed his what the patient is telling me. He does have an appointment to go back to have this repeated sometime later this month. He is feeling well overall. He saw the clinical pharmacists and our diabetic educator after the last visit and his blood sugars and insulin were adjusted to get one unit of NovoLog per 6 g of carbs and this has helped with controlling his blood sugar. He is only had a couple of lows. I reminded him to call our diabetic educator/clinical pharmacists anytime he had any questions about his blood sugar control. He denies any chest pain or shortness of breath. His GI tract is working well as far as he can tell with no nausea vomiting diarrhea blood in the stool or black tarry bowel movements. He does have occasional loose bowel movements but says this is been the case since he had his colon cancer surgery. He does see the ophthalmologist regularly and recently saw him 2 months ago. He is passing his water without problems. He does walk and exercise.    Patient Active Problem List   Diagnosis Date Noted  . Thrombocytopenia ([place]) 01/22/2015  . Hyperlipidemia 12/12/2013  . Nausea and vomiting 04/06/2013  . Diabetes 1.5, managed as type 1 ([place]) 10/29/2012  . HTN (hypertension) 03/14/2012  . Chronic diastolic heart failure, NYHA class 1 ([place]) 03/12/2012  . Demand ischemia ([place]) 03/12/2012  . Fracture of left clavicle-distal 03/12/2012  . NSTEMI (non-ST elevated myocardial infarction) ([place]) 03/11/2012  . Atrial fibrillation with RVR ([place]) 03/11/2012  . Diabetic  ketoacidosis ([place]) 03/10/2012  . Dehydration, severe 03/10/2012  . Right bundle branch block 03/10/2012  . Atrial fibrillation by electrocardiogram ([place]) 03/10/2012  . Elevated troponin I level 03/10/2012  . Lynch syndrome 07/28/2011  . Stage III (T3 N1) disease poorly differentiated adenocarcinoma of the right colon status post right colectomy on 06/02/2011 05/25/2011  . Nonspecific abnormal finding in stool contents 05/10/2011  . Diabetes mellitus type 2, uncontrolled, without complications ([place]) 48/25/0037  . Glaucoma 05/10/2011   Outpatient Encounter Prescriptions as of 07/23/2015  Medication Sig  . acetaminophen (TYLENOL) 500 MG tablet Take 1,000 mg by mouth every 6 (six) hours as needed. Pain   . Ascorbic Acid (VITAMIN C PO) Take 1 tablet by mouth every morning.   Marland Kitchen aspirin EC 81 MG tablet Take 81 mg by mouth daily.  . brimonidine (ALPHAGAN P) 0.1 % SOLN   . Cholecalciferol (VITAMIN D) 2000 units CAPS Take by mouth.  . Dorzolamide HCl-Timolol Mal (COSOPT PF OP) Apply 1 drop to eye 2 (two) times daily.  Marland Kitchen glucose blood test strip Use as instructed  . insulin aspart (NOVOLOG) 100 UNIT/ML injection 8 TO 15 UNITS INTO SKIN THREE TIMES A DAY BEFORE MEALS-TAKE 1 UNIT FOR EVERY 6GMS OF CARBS + CORR 1 un/50  . insulin glargine (LANTUS) 100 UNIT/ML injection Inject 0.3 mLs (30 Units total) into the skin at bedtime. INJECT 24 UNITS INTO THE SKIN ONCE A DAY AT BEDTIME AS INSTRUCTED; must be seen for further refills  . latanoprost (XALATAN) 0.005 % ophthalmic  solution Place 1 drop into both eyes at bedtime.  Marland Kitchen levocetirizine (XYZAL) 5 MG tablet TAKE ONE TABLET BY MOUTH ONE TIME DAILY  . lisinopril (PRINIVIL,ZESTRIL) 2.5 MG tablet Take 1 tablet (2.5 mg total) by mouth daily.  . Multiple Vitamin (MULTIVITAMIN) tablet Take 1 tablet by mouth every morning.   . triamcinolone (NASACORT) 55 MCG/ACT AERO nasal inhaler INSTILL 1 SPRAY IN EACH NOSTRIL ONCE OR TWICE DAILY AS NEEDED.  . [DISCONTINUED]  insulin aspart (NOVOLOG) 100 UNIT/ML injection 8 TO 15 UNITS INTO SKIN THREE TIMES A DAY BEFORE MEALS-TAKE 1 UNIT FOR EVERY 6GMS OF CARBS + CORR 1 un/50  . [DISCONTINUED] insulin glargine (LANTUS) 100 UNIT/ML injection Inject 0.3 mLs (30 Units total) into the skin at bedtime. INJECT 24 UNITS INTO THE SKIN ONCE A DAY AT BEDTIME AS INSTRUCTED; must be seen for further refills  . [DISCONTINUED] timolol (TIMOPTIC) 0.25 % ophthalmic solution 1 drop 2 (two) times daily.  . simvastatin (ZOCOR) 40 MG tablet Take 1 tablet (40 mg total) by mouth at bedtime. (Patient not taking: Reported on 07/23/2015)  . [DISCONTINUED] azithromycin (ZITHROMAX) 250 MG tablet Take 2 the first day and then one each day after.  . [DISCONTINUED] Vitamin D, Ergocalciferol, (DRISDOL) 50000 UNITS CAPS capsule Take 1 capsule (50,000 Units total) by mouth every 7 (seven) days. (Patient not taking: Reported on 05/01/2015)   No facility-administered encounter medications on file as of 07/23/2015.      Review of Systems  Constitutional: Negative.   HENT: Negative.   Eyes: Negative.   Respiratory: Negative.   Cardiovascular: Negative.   Gastrointestinal: Negative.   Endocrine: Negative.   Genitourinary: Negative.   Musculoskeletal: Negative.   Skin: Negative.   Allergic/Immunologic: Negative.   Neurological: Negative.   Hematological: Negative.   Psychiatric/Behavioral: Negative.        Objective:   Physical Exam  Constitutional: He is oriented to person, place, and time. He appears well-developed and well-nourished. No distress.  HENT:  Head: Normocephalic and atraumatic.  Right Ear: External ear normal.  Left Ear: External ear normal.  Nose: Nose normal.  Mouth/Throat: Oropharynx is clear and moist. No oropharyngeal exudate.  Eyes: Conjunctivae and EOM are normal. Pupils are equal, round, and reactive to light. Right eye exhibits no discharge. Left eye exhibits no discharge. No scleral icterus.  Neck: Normal range of  motion. Neck supple. No thyromegaly present.  Cardiovascular: Normal rate, regular rhythm, normal heart sounds and intact distal pulses.   No murmur heard. Rhythm is regular at 72/m  Pulmonary/Chest: Effort normal and breath sounds normal. No respiratory distress. He has no wheezes. He has no rales. He exhibits no tenderness.  Abdominal: Soft. Bowel sounds are normal. He exhibits no mass. There is no tenderness. There is no rebound and no guarding.  Musculoskeletal: Normal range of motion. He exhibits no edema.  Lymphadenopathy:    He has no cervical adenopathy.  Neurological: He is alert and oriented to person, place, and time. He has normal reflexes. No cranial nerve deficit.  Skin: Skin is warm and dry. No rash noted.  Fungal toenails  Psychiatric: He has a normal mood and affect. His behavior is normal. Judgment and thought content normal.  Nursing note and vitals reviewed.  BP 115/66 mmHg  Pulse 82  Temp(Src) 97.6 F (36.4 C) (Oral)  Ht 6' 3"  (1.905 m)  Wt 172 lb (78.019 kg)  BMI 21.50 kg/m2        Assessment & Plan:  1. Type 1 diabetes mellitus  with hyperglycemia ([place]) -Continue current treatment regimen pending results of lab work - CBC with Differential/Platelet - Bayer DCA Hb A1c Waived  2. Hyperlipidemia -Continue simvastatin pending results of lab work - CBC with Differential/Platelet - Lipid panel  3. Vitamin D deficiency -Continue vitamin D replacement pending results of lab work - CBC with Differential/Platelet - VITAMIN D 25 Hydroxy (Vit-D Deficiency, Fractures)  4. Essential hypertension -The blood pressure is good today and he will continue with his current treatment - BMP8+EGFR - CBC with Differential/Platelet - Hepatic function panel  5. BPH (benign prostatic hyperplasia) -No complaints with voiding - CBC with Differential/Platelet  6. Atrial fibrillation with RVR ([facility]) -No palpitations and heart was in normal sinus rhythm today. - CBC with  Differential/Platelet  7. Special screening for malignant neoplasms, colon -Return the FOBT card to continue to follow up with gastroenterology and oncology - CBC with Differential/Platelet - Fecal occult blood, imunochemical; Future  8. Thrombocytopenia ([facility]) -No bleeding symptoms  9. Stage III (T3 N1) disease poorly differentiated adenocarcinoma of the right colon status post right colectomy on 06/02/2011 -Continue to follow-up with oncology and gastroenterology  10. NSTEMI (non-ST elevated myocardial infarction) [facility]) -Follow-up with cardiology as needed  11. Uncontrolled type 2 diabetes mellitus without complication, with long-term current use of insulin ([facility]) -Continue to check blood sugars closely and get plenty of exercise and call us back with any issues or problems that come up  12. Hyperlipidemia due to type 1 diabetes mellitus ([place]) -Continue current treatment and aggressive therapeutic lifestyle changes  Meds ordered this encounter  Medications  . Dorzolamide HCl-Timolol Mal (COSOPT PF OP)    Sig: Apply 1 drop to eye 2 (two) times daily.  . insulin glargine (LANTUS) 100 UNIT/ML injection    Sig: Inject 0.3 mLs (30 Units total) into the skin at bedtime. INJECT 24 UNITS INTO THE SKIN ONCE A DAY AT BEDTIME AS INSTRUCTED; must be seen for further refills    Dispense:  10 mL    Refill:  11    Must be seen before next refill  . insulin aspart (NOVOLOG) 100 UNIT/ML injection    Sig: 8 TO 15 UNITS INTO SKIN THREE TIMES A DAY BEFORE MEALS-TAKE 1 UNIT FOR EVERY 6GMS OF CARBS + CORR 1 un/50    Dispense:  20 mL    Refill:  11    NOTE CHANGE IN DOSE   Patient Instructions                       Medicare Annual Wellness Visit  [facility] and the medical providers at [facility] strive to bring you the best medical care.  In doing so we not only want to address your current medical conditions and concerns but also to detect new conditions early and prevent  illness, disease and health-related problems.    Medicare offers a yearly Wellness Visit which allows our clinical staff to assess your need for preventative services including immunizations, lifestyle education, counseling to decrease risk of preventable diseases and screening for fall risk and other medical concerns.    This visit is provided free of charge (no copay) for all Medicare recipients. The clinical pharmacists at [facility] have begun to conduct these Wellness Visits which will also include a thorough review of all your medications.    As you primary medical provider recommend that you make an appointment for your Annual Wellness Visit if you have not done so  already this year.  You may set up this appointment before you leave today or you may call back ([phone]) and schedule an appointment.  Please make sure when you call that you mention that you are scheduling your Annual Wellness Visit with the clinical pharmacist so that the appointment may be made for the proper length of time.     Continue current medications. Continue good therapeutic lifestyle changes which include good diet and exercise. Fall precautions discussed with patient. If an FOBT was given today- please return it to our front desk. If you are over 38 years old - you may need Prevnar 1 or the adult Pneumonia vaccine.  **Flu shots are available--- please call and schedule a FLU-CLINIC appointment**  After your visit with [place] today you will receive a survey in the mail or online from [employer] regarding your care with [place]. Please take a moment to fill this out. Your feedback is very important to [place] as you can help [place] better understand your patient needs as well as improve your experience and satisfaction. WE CARE ABOUT YOU!!!   Follow-up with oncology as planned Make an appointment with the podiatrist for nail trimming Monitor blood sugars closely--call the clinical pharmacists anytime if  there is any question about blood sugars or control If sick with a virus, please come to the doctor's office as soon as possible   Arrie Senate MD

## 2015-07-24 LAB — CBC WITH DIFFERENTIAL/PLATELET
BASOS ABS: 0 10*3/uL (ref 0.0–0.2)
Basos: 0 %
EOS (ABSOLUTE): 0.1 10*3/uL (ref 0.0–0.4)
Eos: 3 %
Hematocrit: 41.1 % (ref 37.5–51.0)
Hemoglobin: 14 g/dL (ref 12.6–17.7)
IMMATURE GRANS (ABS): 0 10*3/uL (ref 0.0–0.1)
IMMATURE GRANULOCYTES: 0 %
LYMPHS: 44 %
Lymphocytes Absolute: 2.1 10*3/uL (ref 0.7–3.1)
MCH: 31.9 pg (ref 26.6–33.0)
MCHC: 34.1 g/dL (ref 31.5–35.7)
MCV: 94 fL (ref 79–97)
MONOCYTES: 12 %
Monocytes Absolute: 0.6 10*3/uL (ref 0.1–0.9)
NEUTROS ABS: 1.9 10*3/uL (ref 1.4–7.0)
NEUTROS PCT: 41 %
PLATELETS: 143 10*3/uL — AB (ref 150–379)
RBC: 4.39 x10E6/uL (ref 4.14–5.80)
RDW: 14.5 % (ref 12.3–15.4)
WBC: 4.8 10*3/uL (ref 3.4–10.8)

## 2015-07-24 LAB — BMP8+EGFR
BUN/Creatinine Ratio: 14 (ref 10–24)
BUN: 14 mg/dL (ref 8–27)
CALCIUM: 9.1 mg/dL (ref 8.6–10.2)
CHLORIDE: 103 mmol/L (ref 96–106)
CO2: 26 mmol/L (ref 18–29)
Creatinine, Ser: 1.02 mg/dL (ref 0.76–1.27)
GFR calc non Af Amer: 77 mL/min/{1.73_m2} (ref >59)
GFR, EST AFRICAN AMERICAN: 89 mL/min/{1.73_m2} (ref >59)
Glucose: 75 mg/dL (ref 65–99)
Potassium: 3.8 mmol/L (ref 3.5–5.2)
Sodium: 142 mmol/L (ref 134–144)

## 2015-07-24 LAB — LIPID PANEL
CHOL/HDL RATIO: 3.6 ratio (ref 0.0–5.0)
Cholesterol, Total: 222 mg/dL — ABNORMAL HIGH (ref 100–199)
HDL: 62 mg/dL (ref >39)
LDL CALC: 139 mg/dL — AB (ref 0–99)
Triglycerides: 103 mg/dL (ref 0–149)
VLDL CHOLESTEROL CAL: 21 mg/dL (ref 5–40)

## 2015-07-24 LAB — HEPATIC FUNCTION PANEL
ALBUMIN: 4.2 g/dL (ref 3.6–4.8)
ALT: 19 IU/L (ref 0–44)
AST: 28 IU/L (ref 0–40)
Alkaline Phosphatase: 88 IU/L (ref 39–117)
Bilirubin Total: 0.3 mg/dL (ref 0.0–1.2)
Bilirubin, Direct: 0.11 mg/dL (ref 0.00–0.40)
Total Protein: 6.1 g/dL (ref 6.0–8.5)

## 2015-07-24 LAB — VITAMIN D 25 HYDROXY (VIT D DEFICIENCY, FRACTURES): VIT D 25 HYDROXY: 16 ng/mL — AB (ref 30.0–100.0)

## 2015-07-24 MED ORDER — VITAMIN D (ERGOCALCIFEROL) 1.25 MG (50000 UNIT) PO CAPS: 50000.0000 [IU] | ORAL_CAPSULE | ORAL | Status: DC

## 2015-07-24 NOTE — Telephone Encounter (Signed)
-----   Message from Chipper Herb, MD sent at 07/24/2015  7:42 AM EDT ----- The blood sugar is good at 75. The creatinine, the most important kidney function test remains within normal limits. The electrolytes including potassium are good. The CBC has a normal white blood cell count. The hemoglobin is good at 14.0. The platelet count remains slightly decreased but not as much as it has been in the past. We will continue to monitor this. It is of note that the patient's father also had problems platelet count. All liver function tests are within normal limits Cholesterol numbers with traditional lipid testing have a total LDL C that is elevated at 139. The triglycerides are good at 103. Total cholesterol is also elevated at 222.+++++ when the patient finishes his current supply of simvastatin and we should consider switching him to a different statin drug i.e. Crestor 20 mg 1 daily since it has become generic. I will leave this up to the clinical pharmacists when she sees him regarding his elevated hemoglobin A1 C The vitamin D remains very low. His creatinine is normal.------ please start the patient on vitamin D 50,000 units once weekly #12 one refill. He should have the vitamin D level rechecked in 3-4 months. Please call this prescription in for him. If he is still taking vitamin D3 over-the-counter he can hold this while he is taking the once a week strength of vitamin D. He should take a copy of this blood work with him to his visits with his oncologist.++++

## 2015-08-06 ENCOUNTER — Other Ambulatory Visit (HOSPITAL_BASED_OUTPATIENT_CLINIC_OR_DEPARTMENT_OTHER): Payer: BLUE CROSS/BLUE SHIELD

## 2015-08-06 DIAGNOSIS — C182 Malignant neoplasm of ascending colon: Secondary | ICD-10-CM | POA: Diagnosis not present

## 2015-08-07 LAB — CEA (PARALLEL TESTING): CEA: 4.3 ng/mL — AB

## 2015-08-07 LAB — CEA: CEA: 6.2 ng/mL — ABNORMAL HIGH (ref 0.0–4.7)

## 2015-08-11 ENCOUNTER — Telehealth: Payer: Self-pay | Admitting: *Deleted

## 2015-08-11 DIAGNOSIS — C182 Malignant neoplasm of ascending colon: Secondary | ICD-10-CM

## 2015-08-11 NOTE — Telephone Encounter (Signed)
-----   Message from Ladell Pier, MD sent at 08/10/2015  8:50 PM EDT ----- Please call patient, cea is mildly elevated-unchanged, repeat again in 1 month, if still high we will order CTs

## 2015-08-11 NOTE — Telephone Encounter (Signed)
Per Dr. Benay Spice, pt notified that cea is mildly elevated-unchanged, that we will repeat again in 1 month and if still high we will order CT's.  Pt has no questions or concerns at this time and is appreciative of call.

## 2015-08-19 LAB — HM DIABETES EYE EXAM

## 2015-09-07 ENCOUNTER — Other Ambulatory Visit (HOSPITAL_BASED_OUTPATIENT_CLINIC_OR_DEPARTMENT_OTHER): Payer: BLUE CROSS/BLUE SHIELD

## 2015-09-07 DIAGNOSIS — C182 Malignant neoplasm of ascending colon: Secondary | ICD-10-CM | POA: Diagnosis not present

## 2015-09-08 LAB — CEA: CEA1: 4.8 ng/mL — AB (ref 0.0–4.7)

## 2015-09-09 ENCOUNTER — Telehealth: Payer: Self-pay | Admitting: *Deleted

## 2015-09-09 ENCOUNTER — Other Ambulatory Visit: Payer: Self-pay | Admitting: *Deleted

## 2015-09-09 DIAGNOSIS — C182 Malignant neoplasm of ascending colon: Secondary | ICD-10-CM

## 2015-09-09 NOTE — Telephone Encounter (Signed)
Per Dr. Benay Spice, message left on patients private number to inform him that CEA is better and that we will repeat it at his scheduled appt in June.  Pt instructed to call [place] with any questions or concerns.

## 2015-09-09 NOTE — Telephone Encounter (Signed)
-----   Message from Ladell Pier, MD sent at 09/08/2015  8:13 PM EDT ----- Please call patient, cea is better, repeat 2 months, ? Is cbc scheduled for late june

## 2015-10-09 ENCOUNTER — Other Ambulatory Visit (HOSPITAL_BASED_OUTPATIENT_CLINIC_OR_DEPARTMENT_OTHER): Payer: BLUE CROSS/BLUE SHIELD

## 2015-10-09 DIAGNOSIS — C182 Malignant neoplasm of ascending colon: Secondary | ICD-10-CM | POA: Diagnosis not present

## 2015-10-09 DIAGNOSIS — Z1509 Genetic susceptibility to other malignant neoplasm: Secondary | ICD-10-CM

## 2015-10-09 LAB — CBC WITH DIFFERENTIAL/PLATELET
BASO%: 0.2 % (ref 0.0–2.0)
Basophils Absolute: 0 10*3/uL (ref 0.0–0.1)
EOS%: 1.4 % (ref 0.0–7.0)
Eosinophils Absolute: 0.1 10*3/uL (ref 0.0–0.5)
HCT: 39.7 % (ref 38.4–49.9)
HGB: 14.2 g/dL (ref 13.0–17.1)
LYMPH%: 44.5 % (ref 14.0–49.0)
MCH: 32.3 pg (ref 27.2–33.4)
MCHC: 35.8 g/dL (ref 32.0–36.0)
MCV: 90.2 fL (ref 79.3–98.0)
MONO#: 0.5 10*3/uL (ref 0.1–0.9)
MONO%: 10.7 % (ref 0.0–14.0)
NEUT#: 1.8 10*3/uL (ref 1.5–6.5)
NEUT%: 43.2 % (ref 39.0–75.0)
Platelets: 125 10*3/uL — ABNORMAL LOW (ref 140–400)
RBC: 4.4 10*6/uL (ref 4.20–5.82)
RDW: 12.3 % (ref 11.0–14.6)
WBC: 4.2 10*3/uL (ref 4.0–10.3)
lymph#: 1.9 10*3/uL (ref 0.9–3.3)

## 2015-10-10 LAB — CEA: CEA1: 5.9 ng/mL — AB (ref 0.0–4.7)

## 2015-10-12 ENCOUNTER — Telehealth: Payer: Self-pay | Admitting: *Deleted

## 2015-10-12 DIAGNOSIS — C182 Malignant neoplasm of ascending colon: Secondary | ICD-10-CM

## 2015-10-12 NOTE — Telephone Encounter (Signed)
-----   Message from Ladell Pier, MD sent at 10/12/2015  8:11 AM EDT ----- Please call patient, cea is still mildly elevated by new [facility] assay,  Will be running CEA at [facility] starting July , schedule repeat cea  Late July  Likely normal CEA,

## 2015-10-12 NOTE — Telephone Encounter (Signed)
Per Dr. Benay Spice, message left on patients private phone to inform him that cea is still mildly elevated by new [facility] assay and that cea will be run at [facility] starting in July and we will schedule a repeat cea in late July.  Patient instructed to call [place] back with any questions or concerns.

## 2015-10-13 ENCOUNTER — Telehealth: Payer: Self-pay | Admitting: Oncology

## 2015-10-13 NOTE — Telephone Encounter (Signed)
Spoke with patient to confirm 7/31 appt date/time per 6/27 pof

## 2015-11-16 ENCOUNTER — Other Ambulatory Visit (HOSPITAL_BASED_OUTPATIENT_CLINIC_OR_DEPARTMENT_OTHER): Payer: BLUE CROSS/BLUE SHIELD

## 2015-11-16 DIAGNOSIS — C182 Malignant neoplasm of ascending colon: Secondary | ICD-10-CM

## 2015-11-17 ENCOUNTER — Telehealth: Payer: Self-pay

## 2015-11-17 LAB — CEA: CEA1: 4.3 ng/mL (ref 0.0–4.7)

## 2015-11-17 NOTE — Telephone Encounter (Signed)
-----   Message from Ladell Pier, MD sent at 11/17/2015  1:35 PM EDT ----- Please call patient, cea is lower, f/u as scheduled

## 2015-11-17 NOTE — Telephone Encounter (Signed)
Called and informed pt of cea result and to follow up as scheduled 9/22. Pt verbalized understanding and denies any questions or concerns at this time.

## 2015-11-25 ENCOUNTER — Telehealth: Payer: Self-pay | Admitting: Family Medicine

## 2015-11-25 ENCOUNTER — Ambulatory Visit (INDEPENDENT_AMBULATORY_CARE_PROVIDER_SITE_OTHER): Payer: BLUE CROSS/BLUE SHIELD | Admitting: Family Medicine

## 2015-11-25 ENCOUNTER — Encounter: Payer: Self-pay | Admitting: Family Medicine

## 2015-11-25 VITALS — BP 159/79 | HR 70 | Temp 97.8°F | Ht 75.0 in | Wt 163.0 lb

## 2015-11-25 DIAGNOSIS — E785 Hyperlipidemia, unspecified: Secondary | ICD-10-CM

## 2015-11-25 DIAGNOSIS — N400 Enlarged prostate without lower urinary tract symptoms: Secondary | ICD-10-CM

## 2015-11-25 DIAGNOSIS — W57XXXA Bitten or stung by nonvenomous insect and other nonvenomous arthropods, initial encounter: Secondary | ICD-10-CM

## 2015-11-25 DIAGNOSIS — C182 Malignant neoplasm of ascending colon: Secondary | ICD-10-CM

## 2015-11-25 DIAGNOSIS — E559 Vitamin D deficiency, unspecified: Secondary | ICD-10-CM | POA: Diagnosis not present

## 2015-11-25 DIAGNOSIS — S3093XA Unspecified superficial injury of penis, initial encounter: Secondary | ICD-10-CM

## 2015-11-25 DIAGNOSIS — E1065 Type 1 diabetes mellitus with hyperglycemia: Secondary | ICD-10-CM

## 2015-11-25 DIAGNOSIS — I4891 Unspecified atrial fibrillation: Secondary | ICD-10-CM | POA: Diagnosis not present

## 2015-11-25 DIAGNOSIS — B351 Tinea unguium: Secondary | ICD-10-CM | POA: Diagnosis not present

## 2015-11-25 DIAGNOSIS — I10 Essential (primary) hypertension: Secondary | ICD-10-CM | POA: Diagnosis not present

## 2015-11-25 MED ORDER — DOXYCYCLINE HYCLATE 100 MG PO TABS
100.0000 mg | ORAL_TABLET | Freq: Two times a day (BID) | ORAL | 0 refills | Status: DC
Start: 2015-11-25 — End: 2015-12-24

## 2015-11-25 NOTE — Patient Instructions (Addendum)
Continue current medications. Continue good therapeutic lifestyle changes which include good diet and exercise. Fall precautions discussed with patient. If an FOBT was given today- please return it to our front desk. If you are over 65 years old - you may need Prevnar 35 or the adult Pneumonia vaccine.   After your visit with [place] today you will receive a survey in the mail or online from [employer] regarding your care with [place]. Please take a moment to fill this out. Your feedback is very important to [place] as you can help [place] better understand your patient needs as well as improve your experience and satisfaction. WE CARE ABOUT YOU!!!    the patient should check home blood pressure readings more frequently and bring these readings when he comes to visit with the clinical pharmacist   the patient should check his blood sugars more frequently and bring these readings when he comes to visit with the clinical pharmacist so that his insulin and controlled can be improved.   The patient should take the antibiotic regularly twice daily with food because of the tick bite history   he should keep his follow-up appointment that is planned with his ophthalmologist   he should keep his follow-up appointment with his podiatrist   he should start taking his lisinopril  regularly

## 2015-11-25 NOTE — Progress Notes (Addendum)
Subjective:    Patient ID: Greg Robertson, male    DOB: [birth year], 65 y.o.   MRN: [id number]  HPI Pt here for follow up and management of chronic medical problems which includes diabetes, hyperlipidemia and hypertension. He is taking medications regularly. The patient continues to have problems with elevated blood sugars. He has an appointment with the ophthalmologist next month, Dr. Pearline Cables. The blood sugars are elevated at nighttime. He also complains of 2 tick bites. One was 3 months ago and another one in 1 week ago. The patient says he has not been taking his lisinopril regularly and he was encouraged to do this. He has a blood pressure monitor at home but has not been checking his blood pressure. He is concerned because his blood sugars at night have been going high as high as 250-350. His blood sugars in the morning have been dropping down in the 50-60 range and this was only on a couple of occasions over the past 3 months. He has adjusted his insulin by taking extra NovoLog at night when his blood sugars have been elevated. He admits to not following his diet as closely as he should and specifically not eating breakfast in the morning because he's been sleeping in. He awoke this morning with trembling and shaking because his blood sugar was down to 60. He has plans to get his eye exam next month. He also has a follow-up appointment with his oncologist soon because of his history of colon cancer and they will be repeating his CEA. He denies any chest pain pressure or palpitations or shortness of breath. He denies any trouble with heartburn indigestion nausea vomiting diarrhea blood in the stool or black tarry bowel movements. He is passing his water without problems. Also describes 2 tick bites and we will 2 tests for these. The patient also is planning to go see the podiatrist because of tinea unguium. He also is complaining with dry skin.     Patient Active Problem List   Diagnosis Date Noted   . Thrombocytopenia ([place]) 01/22/2015  . Hyperlipidemia due to type 1 diabetes mellitus ([place]) 12/12/2013  . Nausea and vomiting 04/06/2013  . Diabetes 1.5, managed as type 1 ([place]) 10/29/2012  . HTN (hypertension) 03/14/2012  . Chronic diastolic heart failure, NYHA class 1 ([place]) 03/12/2012  . Demand ischemia ([place]) 03/12/2012  . Fracture of left clavicle-distal 03/12/2012  . NSTEMI (non-ST elevated myocardial infarction) ([place]) 03/11/2012  . Atrial fibrillation with RVR ([place]) 03/11/2012  . Diabetic ketoacidosis ([place]) 03/10/2012  . Dehydration, severe 03/10/2012  . Right bundle branch block 03/10/2012  . Atrial fibrillation by electrocardiogram ([place]) 03/10/2012  . Elevated troponin I level 03/10/2012  . Lynch syndrome 07/28/2011  . Stage III (T3 N1) disease poorly differentiated adenocarcinoma of the right colon status post right colectomy on 06/02/2011 05/25/2011  . Nonspecific abnormal finding in stool contents 05/10/2011  . Diabetes mellitus type 2, uncontrolled, without complications ([place]) 76/22/6333  . Glaucoma 05/10/2011   Outpatient Encounter Prescriptions as of 11/25/2015  Medication Sig  . acetaminophen (TYLENOL) 500 MG tablet Take 1,000 mg by mouth every 6 (six) hours as needed. Pain   . Ascorbic Acid (VITAMIN C PO) Take 1 tablet by mouth every morning.   Marland Kitchen aspirin EC 81 MG tablet Take 81 mg by mouth daily.  . brimonidine (ALPHAGAN P) 0.1 % SOLN   . Cholecalciferol (VITAMIN D) 2000 units CAPS Take by mouth.  . Dorzolamide HCl-Timolol Mal (COSOPT PF OP) Apply 1  drop to eye 2 (two) times daily.  Marland Kitchen glucose blood test strip Use as instructed  . insulin aspart (NOVOLOG) 100 UNIT/ML injection 8 TO 15 UNITS INTO SKIN THREE TIMES A DAY BEFORE MEALS-TAKE 1 UNIT FOR EVERY 6GMS OF CARBS + CORR 1 un/50  . insulin glargine (LANTUS) 100 UNIT/ML injection Inject 0.3 mLs (30 Units total) into the skin at bedtime. INJECT 24 UNITS INTO THE SKIN ONCE A DAY AT BEDTIME AS INSTRUCTED; must be seen for  further refills  . latanoprost (XALATAN) 0.005 % ophthalmic solution Place 1 drop into both eyes at bedtime.  Marland Kitchen levocetirizine (XYZAL) 5 MG tablet TAKE ONE TABLET BY MOUTH ONE TIME DAILY  . lisinopril (PRINIVIL,ZESTRIL) 2.5 MG tablet Take 1 tablet (2.5 mg total) by mouth daily.  . Multiple Vitamin (MULTIVITAMIN) tablet Take 1 tablet by mouth every morning.   . simvastatin (ZOCOR) 40 MG tablet Take 1 tablet (40 mg total) by mouth at bedtime.  . triamcinolone (NASACORT) 55 MCG/ACT AERO nasal inhaler INSTILL 1 SPRAY IN EACH NOSTRIL ONCE OR TWICE DAILY AS NEEDED.  . Vitamin D, Ergocalciferol, (DRISDOL) 50000 units CAPS capsule Take 1 capsule (50,000 Units total) by mouth every 7 (seven) days.   No facility-administered encounter medications on file as of 11/25/2015.       Review of Systems  Constitutional: Negative.   HENT: Negative.   Eyes: Negative.   Respiratory: Negative.   Cardiovascular: Negative.   Gastrointestinal: Negative.   Endocrine: Negative.        Elevated BS  Genitourinary: Negative.   Musculoskeletal: Negative.   Skin: Negative.   Allergic/Immunologic: Negative.   Neurological: Negative.   Hematological: Negative.   Psychiatric/Behavioral: Negative.        Objective:   Physical Exam  Constitutional: He is oriented to person, place, and time. He appears well-developed and well-nourished. No distress.  HENT:  Head: Normocephalic and atraumatic.  Right Ear: External ear normal.  Left Ear: External ear normal.  Nose: Nose normal.  Mouth/Throat: Oropharynx is clear and moist. No oropharyngeal exudate.  Eyes: Conjunctivae and EOM are normal. Pupils are equal, round, and reactive to light. Right eye exhibits no discharge. Left eye exhibits no discharge. No scleral icterus.  Eye exam is planned next month with Dr. Katy Fitch  Neck: Normal range of motion. Neck supple. No thyromegaly present.  No bruits thyromegaly or anterior cervical adenopathy  Cardiovascular: Normal  rate, regular rhythm, normal heart sounds and intact distal pulses.   No murmur heard. Heart has a regular rate and rhythm at 72/m  Pulmonary/Chest: Effort normal and breath sounds normal. No respiratory distress. He has no wheezes. He has no rales. He exhibits no tenderness.  Clear anteriorly and posteriorly and no axillary adenopathy  Abdominal: Soft. Bowel sounds are normal. He exhibits no distension and no mass. There is no tenderness. There is no rebound and no guarding.  There was no liver or spleen enlargement. There is no epigastric tenderness. There is no masses. The patient has good inguinal pulses.  Genitourinary: Penis normal.  Genitourinary Comments: There is a small area of erythema from the insect bite to his penis.  Musculoskeletal: Normal range of motion. He exhibits no edema.  Lymphadenopathy:    He has no cervical adenopathy.  Neurological: He is alert and oriented to person, place, and time. He has normal reflexes. No cranial nerve deficit.  Skin: Skin is warm and dry. Rash noted.  Bite site erythema on penis  Psychiatric: He has a normal mood  and affect. His behavior is normal. Judgment and thought content normal.  Nursing note and vitals reviewed.   BP (!) 154/96 (BP Location: Left Arm)   Pulse 73   Temp 97.8 F (36.6 C) (Oral)   Ht 6' 3"  (1.905 m)   Wt 163 lb (73.9 kg)   BMI 20.37 kg/m        Assessment & Plan:  1. Type 1 diabetes mellitus with hyperglycemia (HCC) - because of the elevated blood sugars and the fluctuation of the blood sugars from evening until morning, the patient will record the readings through the day more regularly and bring these readings back in a short period of time to review with the clinical pharmacist to get better and smoother blood sugar control - Bayer DCA Hb A1c Waived; Future - BMP8+EGFR; Future - CBC with Differential/Platelet; Future  2. Hyperlipidemia - continue with aggressive therapeutic lifestyle changes pending  results of lab work - CBC with Differential/Platelet; Future - Lipid panel; Future  3. Vitamin D deficiency - continue with current treatment pending results of lab work - CBC with Differential/Platelet; Future - VITAMIN D 25 Hydroxy (Vit-D Deficiency, Fractures); Future  4. Essential hypertension - the blood pressure is elevated today and patient will start and take his lisinopril more regularly. He will bring readings in to the visit with the clinical pharmacist and the medication may have to be up regulated to get better control. He will need a BMP at that time - BMP8+EGFR; Future - CBC with Differential/Platelet; Future - Hepatic function panel; Future  5. BPH (benign prostatic hyperplasia) - CBC with Differential/Platelet; Future  6. Atrial fibrillation with RVR (HCC) - no problems with atrial fibrillation - CBC with Differential/Platelet; Future  7. Tick bites - tick titers and start treatment with doxycycline 100 mg twice daily with food for 3 week - CBC with Differential/Platelet; Future - Lyme Ab/Western Blot Reflex; Future - Rocky mtn spotted fvr abs pnl(IgG+IgM); Future  8. Tinea of nail - follow-up with podiatry  9.  Colon cancer - follow-up with oncology as planned  Patient Instructions  Continue current medications. Continue good therapeutic lifestyle changes which include good diet and exercise. Fall precautions discussed with patient. If an FOBT was given today- please return it to our front desk. If you are over 58 years old - you may need Prevnar 49 or the adult Pneumonia vaccine.   After your visit with [place] today you will receive a survey in the mail or online from [employer] regarding your care with [place]. Please take a moment to fill this out. Your feedback is very important to [place] as you can help [place] better understand your patient needs as well as improve your experience and satisfaction. WE CARE ABOUT YOU!!!    the patient should check home blood pressure  readings more frequently and bring these readings when he comes to visit with the clinical pharmacist   the patient should check his blood sugars more frequently and bring these readings when he comes to visit with the clinical pharmacist so that his insulin and controlled can be improved.   The patient should take the antibiotic regularly twice daily with food because of the tick bite history   he should keep his follow-up appointment that is planned with his ophthalmologist   he should keep his follow-up appointment with his podiatrist   he should start taking his lisinopril  regularly   Arrie Senate MD

## 2015-11-25 NOTE — Telephone Encounter (Signed)
Med ordered

## 2015-12-02 ENCOUNTER — Ambulatory Visit (INDEPENDENT_AMBULATORY_CARE_PROVIDER_SITE_OTHER): Payer: BLUE CROSS/BLUE SHIELD | Admitting: Pharmacist

## 2015-12-02 VITALS — BP 158/82 | HR 74 | Ht 75.0 in | Wt 164.0 lb

## 2015-12-02 DIAGNOSIS — E1065 Type 1 diabetes mellitus with hyperglycemia: Secondary | ICD-10-CM

## 2015-12-02 DIAGNOSIS — I10 Essential (primary) hypertension: Secondary | ICD-10-CM | POA: Diagnosis not present

## 2015-12-02 MED ORDER — LISINOPRIL 2.5 MG PO TABS: 2.5000 mg | ORAL_TABLET | Freq: Every day | ORAL | 1 refills | Status: DC

## 2015-12-02 MED ORDER — INSULIN DEGLUDEC 100 UNIT/ML ~~LOC~~ SOPN: 28.0000 [IU] | PEN_INJECTOR | SUBCUTANEOUS | 1 refills | Status: DC

## 2015-12-02 NOTE — Patient Instructions (Addendum)
Come in for labs this week!  Change long acting insulin to [place] and take 28 units in the morning instead of at night to help with nighttime lows.   No changes currently to Novolog - short acting insulin  Restart Lisinopril 2.5mg  take 1 tablet daily  Hypoglycemia Hypoglycemia occurs when the glucose in your blood is too low. Glucose is a type of sugar that is your body's main energy source. Hormones, such as insulin and glucagon, control the level of glucose in the blood. Insulin lowers blood glucose and glucagon increases blood glucose. Having too much insulin in your blood stream, or not eating enough food containing sugar, can result in hypoglycemia. Hypoglycemia can happen to people with or without diabetes. It can develop quickly and can be a medical emergency.  CAUSES   Missing or delaying meals.  Not eating enough carbohydrates at meals.  Taking too much diabetes medicine.  Not timing your oral diabetes medicine or insulin doses with meals, snacks, and exercise.  Nausea and vomiting.  Certain medicines.  Severe illnesses, such as hepatitis, kidney disorders, and certain eating disorders.  Increased activity or exercise without eating something extra or adjusting medicines.  Drinking too much alcohol.  A nerve disorder that affects body functions like your heart rate, blood pressure, and digestion (autonomic neuropathy).  A condition where the stomach muscles do not function properly (gastroparesis). Therefore, medicines and food may not absorb properly.  Rarely, a tumor of the pancreas can produce too much insulin. SYMPTOMS   Hunger.  Sweating (diaphoresis).  Change in body temperature.  Shakiness.  Headache.  Anxiety.  Lightheadedness.  Irritability.  Difficulty concentrating.  Dry mouth.  Tingling or numbness in the hands or feet.  Restless sleep or sleep disturbances.  Altered speech and coordination.  Change in mental status.  Seizures or  prolonged convulsions.  Combativeness.  Drowsiness (lethargic).  Weakness.  Increased heart rate or palpitations.  Confusion.  Pale, gray skin color.  Blurred or double vision.  Fainting. DIAGNOSIS  A physical exam and medical history will be performed. Your caregiver may make a diagnosis based on your symptoms. Blood tests and other lab tests may be performed to confirm a diagnosis. Once the diagnosis is made, your caregiver will see if your signs and symptoms go away once your blood glucose is raised.  TREATMENT  Usually, you can easily treat your hypoglycemia when you notice symptoms.  Check your blood glucose. If it is less than 70 mg/dl, take one of the following:   3-4 glucose tablets.    cup juice.    cup regular soda.   1 cup skim milk.   -1 tube of glucose gel.   5-6 hard candies.   Avoid high-fat drinks or food that may delay a rise in blood glucose levels.  Do not take more than the recommended amount of sugary foods, drinks, gel, or tablets. Doing so will cause your blood glucose to go too high.   Wait 10-15 minutes and recheck your blood glucose. If it is still less than 70 mg/dl or below your target range, repeat treatment.   Eat a snack if it is more than 1 hour until your next meal.  There may be a time when your blood glucose may go so low that you are unable to treat yourself at home when you start to notice symptoms. You may need someone to help you. You may even faint or be unable to swallow. If you cannot treat yourself, someone will  need to bring you to the hospital.  [place]  If you have diabetes, follow your diabetes management plan by:  Taking your medicines as directed.  Following your exercise plan.  Following your meal plan. Do not skip meals. Eat on time.  Testing your blood glucose regularly. Check your blood glucose before and after exercise. If you exercise longer or different than usual, be sure to  check blood glucose more frequently.  Wearing your medical alert jewelry that says you have diabetes.  Identify the cause of your hypoglycemia. Then, develop ways to prevent the recurrence of hypoglycemia.  Do not take a hot bath or shower right after an insulin shot.  Always carry treatment with you. Glucose tablets are the easiest to carry.  If you are going to drink alcohol, drink it only with meals.  Tell friends or family members ways to keep you safe during a seizure. This may include removing hard or sharp objects from the area or turning you on your side.  Maintain a healthy weight. SEEK MEDICAL CARE IF:   You are having problems keeping your blood glucose in your target range.  You are having frequent episodes of hypoglycemia.  You feel you might be having side effects from your medicines.  You are not sure why your blood glucose is dropping so low.  You notice a change in vision or a new problem with your vision. SEEK IMMEDIATE MEDICAL CARE IF:   Confusion develops.  A change in mental status occurs.  The inability to swallow develops.  Fainting occurs.   This information is not intended to replace advice given to you by your health care provider. Make sure you discuss any questions you have with your health care provider.   Document Released: 04/04/2005 Document Revised: 04/09/2013 Document Reviewed: 12/09/2014 Elsevier Interactive Patient Education [employer].

## 2015-12-02 NOTE — Progress Notes (Signed)
Patient ID: Greg Robertson, male   DOB: [birth year], 65 y.o.   MRN: [id number] Subjective:      Greg Robertson is a 65 y.o. male who presents for evaluation of Type 1/1.5 diabetes mellitus and HTN.  The initial diagnosis of diabetes was made several years ago.    His clinical course has fluctuated. Insulin dosage review with Berl suggested compliance most days.  He is checking BG 2-3 times a day.  He is currently taking Novolog prior to each meal and at bedtime if BG is elevated - uses CHO Ratio of 1 unit of insulin per every 6 grams of CHO with a correction factor of 50 (gives 1 unit of insulin per each 50 points BG is greater than 100) and Lantus 30 units units at bedtime.  Insulin injections are given by patient.   Patient reports frequent hypogylcemia mostly occurring early am while sleeping.  Noted 7 hypoglycemia events within the last 15 days per his home BG record.    Am - 55, 59, 63, 136, 186, 36, 83 Noon - 54, 57, 99 Pm - 258, 93, 135, 293, 45, 221, 256 Bedtime - 306, 389, 260, 280, 282, 317, 273  Meal panning: He is using carbohydrate counting Exercise: every other day   Last BP in office was elevated at 159/79.  BP is elevated in office today.  Patient reports that he has not taken lisinopril 2.5mg  qd in several months.  States that it was financial reasons. Home BP readings = 146/76, 164/88, 154/90, 138/76, 167/96, 168/88   Objective:    BP (!) 158/82   Pulse 74   Ht 6\' 3"  (1.905 m)   Wt 164 lb (74.4 kg)   BMI 20.50 kg/m    Lab Review      hemoglobin A1C - 10.2% (07/23/2015)      Patient is due to have fasting labs today but he is not fasting    Assessment:    Diabetes Mellitus type I, under poor control.  HTN - uncontrolled and non compliance with ACE inhibitor.    Plan:    1.  RX changes: Change Lantus to [place] - recommend 28 units once daily IN THE MORNINGIN instead of at bedtime.  Continue current Novolog regimen-  Insulin to CHO ratio to 1:6  and  correction factor of 50  Restart lisinopril 2.5mg  qd  Discussion about benefits of lisinopril for BP and kidneys discussed 2.  Education: Reviewed carbohydrate counting 3.  Compliance at present is estimated to be poor - long discussion made regarding the importance of taking medications regularly and about long term complications for uncontrolled BG, lipids and BP. 4.  Continue to check BG prior to each meal and BP qd to qod 5.   Follow up: 1 month to adjust insulin/ recheck BP  and review compliance.  Cherre Robins, PharmD, CPP, CDE

## 2015-12-03 ENCOUNTER — Encounter: Payer: Self-pay | Admitting: Pharmacist

## 2015-12-22 ENCOUNTER — Other Ambulatory Visit: Payer: BLUE CROSS/BLUE SHIELD

## 2015-12-24 ENCOUNTER — Ambulatory Visit (INDEPENDENT_AMBULATORY_CARE_PROVIDER_SITE_OTHER): Payer: BLUE CROSS/BLUE SHIELD | Admitting: Pharmacist

## 2015-12-24 ENCOUNTER — Telehealth: Payer: Self-pay

## 2015-12-24 VITALS — BP 140/82 | HR 78 | Ht 75.0 in | Wt 169.0 lb

## 2015-12-24 DIAGNOSIS — E1065 Type 1 diabetes mellitus with hyperglycemia: Secondary | ICD-10-CM | POA: Diagnosis not present

## 2015-12-24 DIAGNOSIS — E559 Vitamin D deficiency, unspecified: Secondary | ICD-10-CM | POA: Diagnosis not present

## 2015-12-24 DIAGNOSIS — E785 Hyperlipidemia, unspecified: Secondary | ICD-10-CM

## 2015-12-24 LAB — BAYER DCA HB A1C WAIVED: HB A1C: 8.5 % — AB (ref <7.0)

## 2015-12-24 MED ORDER — INSULIN DEGLUDEC 100 UNIT/ML ~~LOC~~ SOPN: 16.0000 [IU] | PEN_INJECTOR | SUBCUTANEOUS | 0 refills | Status: DC

## 2015-12-24 NOTE — Progress Notes (Signed)
Thank you for continuing to follow-up with him on a monthly basis until some type of stability is achieved

## 2015-12-24 NOTE — Patient Instructions (Signed)
Decrease Tresiba to 16 units each morning.    Continue Novolog using carbohydrate ratio of 1 to 6 and correction of 50. Except when blood glucose is less than 70 - substrate 2 units from calculation.  A1c was 8.5% today (better than 10.2% last time - we are aiming for less than 7%)

## 2015-12-24 NOTE — Telephone Encounter (Signed)
Patient has enough samples of Tresiba to last until next visit.  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  Will try to continue with samples until then and go through PA process with new insurance if needed.  Already discussed with patient at appt earlier today.

## 2015-12-24 NOTE — Progress Notes (Signed)
Patient ID: Greg Robertson, male   DOB: June 01, 1950, 65 y.o.   MRN: [id number]  Subjective:      Greg Robertson is a 65 y.o. male who presents for evaluation of Type 1 diabetes mellitus and HTN.  The initial diagnosis of diabetes was made several years ago.    His clinical course has fluctuated. Insulin dosage review with Tyan suggested compliance most days.    He is currently taking Novolog prior to each meal and at bedtime if BG is elevated - uses CHO Ratio of 1 unit of insulin per every 6 grams of CHO with a correction factor of 50 (gives 1 unit of insulin per each 50 points BG is greater than 100) and Tresiba 20 units units at bedtime (has started with 26 units but decreased due to hypoglycemia in am).  Insulin injections are given by patient.     He is checking BG 2-3 times a day. Am - 64, 116, 255, 41, 60, 395, 64, 152, 39, 57 Noon - 98, 196, 45, 62, 73, 35, 70 Pm - 50, 39, 40, 40, 123, 123, 245, 319, 89, 127 Bedtime - 357, 142, 38, 122, 87  Meal panning: He is using carbohydrate counting Exercise: every other day   BP at last office visit was elevated at 158/82.  BP is elevated in office today but improved.   Patient reports he has been compliant over last month with lisinopril  Objective:    BP (!) 144/84 (BP Location: Left Arm, Patient Position: Sitting, Cuff Size: Normal)   Pulse 78   Ht 6' 3" (1.905 m)   Wt 169 lb (76.7 kg)   BMI 21.12 kg/m    Lab Review      Hemoglobin A1C - 8.5% today      hemoglobin A1C - 10.2% (07/23/2015)     Assessment:    Diabetes Mellitus type I, under inadequate but improved control.  HTN - uncontrolled but improving Low serum vitamin D   Plan:    1.  RX changes: ChangeTresiba 16 units once daily IN THE MORNINGIN   Continue current Novolog regimen-  Insulin to CHO ratio to 1:6 and  correction factor of 50.  Recommended that if BG is less than 70 he is to subtract 2 units from calculated Novolog dose.  Continue lisinopril  2.16m qd 2.  Education: Reviewed carbohydrate counting 3.  Compliance at present is improving 4.  Continue to check BG prior to each meal and BP qd to qod - will consider CGM if insurance will allow 5.   Follow up: 1 month to adjust insulin/ recheck BP and review compliance.  Orders Placed This Encounter  Procedures  . Bayer DCA Hb A1c Waived  . CBC with Differential/Platelet  . CMP14+EGFR  . VITAMIN D 25 Hydroxy (Vit-D Deficiency, Fractures)    TCherre Robins PharmD, CPP, CDE

## 2015-12-25 LAB — CBC WITH DIFFERENTIAL/PLATELET
BASOS ABS: 0 10*3/uL (ref 0.0–0.2)
BASOS: 1 %
EOS (ABSOLUTE): 0 10*3/uL (ref 0.0–0.4)
Eos: 1 %
Hematocrit: 43.8 % (ref 37.5–51.0)
Hemoglobin: 14.6 g/dL (ref 12.6–17.7)
IMMATURE GRANS (ABS): 0 10*3/uL (ref 0.0–0.1)
Immature Granulocytes: 1 %
LYMPHS ABS: 1.1 10*3/uL (ref 0.7–3.1)
LYMPHS: 25 %
MCH: 32.2 pg (ref 26.6–33.0)
MCHC: 33.3 g/dL (ref 31.5–35.7)
MCV: 97 fL (ref 79–97)
Monocytes Absolute: 0.4 10*3/uL (ref 0.1–0.9)
Monocytes: 10 %
NEUTROS ABS: 2.8 10*3/uL (ref 1.4–7.0)
Neutrophils: 62 %
PLATELETS: 144 10*3/uL — AB (ref 150–379)
RBC: 4.54 x10E6/uL (ref 4.14–5.80)
RDW: 14.1 % (ref 12.3–15.4)
WBC: 4.4 10*3/uL (ref 3.4–10.8)

## 2015-12-25 LAB — CMP14+EGFR
A/G RATIO: 2.3 — AB (ref 1.2–2.2)
ALBUMIN: 4.5 g/dL (ref 3.6–4.8)
ALK PHOS: 76 IU/L (ref 39–117)
ALT: 18 IU/L (ref 0–44)
AST: 26 IU/L (ref 0–40)
BILIRUBIN TOTAL: 0.4 mg/dL (ref 0.0–1.2)
BUN / CREAT RATIO: 18 (ref 10–24)
BUN: 16 mg/dL (ref 8–27)
CHLORIDE: 98 mmol/L (ref 96–106)
CO2: 27 mmol/L (ref 18–29)
Calcium: 9.1 mg/dL (ref 8.6–10.2)
Creatinine, Ser: 0.91 mg/dL (ref 0.76–1.27)
GFR calc non Af Amer: 89 mL/min/{1.73_m2} (ref >59)
GFR, EST AFRICAN AMERICAN: 103 mL/min/{1.73_m2} (ref >59)
GLUCOSE: 251 mg/dL — AB (ref 65–99)
Globulin, Total: 2 g/dL (ref 1.5–4.5)
POTASSIUM: 5.3 mmol/L — AB (ref 3.5–5.2)
Sodium: 139 mmol/L (ref 134–144)
TOTAL PROTEIN: 6.5 g/dL (ref 6.0–8.5)

## 2015-12-25 LAB — VITAMIN D 25 HYDROXY (VIT D DEFICIENCY, FRACTURES): VIT D 25 HYDROXY: 18.9 ng/mL — AB (ref 30.0–100.0)

## 2016-01-08 ENCOUNTER — Telehealth: Payer: Self-pay | Admitting: Oncology

## 2016-01-08 ENCOUNTER — Other Ambulatory Visit: Payer: Self-pay | Admitting: Nurse Practitioner

## 2016-01-08 ENCOUNTER — Other Ambulatory Visit (HOSPITAL_BASED_OUTPATIENT_CLINIC_OR_DEPARTMENT_OTHER): Payer: BLUE CROSS/BLUE SHIELD

## 2016-01-08 ENCOUNTER — Encounter: Payer: Self-pay | Admitting: Nurse Practitioner

## 2016-01-08 ENCOUNTER — Ambulatory Visit (HOSPITAL_BASED_OUTPATIENT_CLINIC_OR_DEPARTMENT_OTHER): Payer: BLUE CROSS/BLUE SHIELD | Admitting: Nurse Practitioner

## 2016-01-08 ENCOUNTER — Telehealth: Payer: Self-pay | Admitting: Nurse Practitioner

## 2016-01-08 VITALS — BP 161/92 | HR 71 | Temp 98.5°F | Resp 17 | Ht 75.0 in | Wt 165.7 lb

## 2016-01-08 DIAGNOSIS — C182 Malignant neoplasm of ascending colon: Secondary | ICD-10-CM

## 2016-01-08 DIAGNOSIS — Z1509 Genetic susceptibility to other malignant neoplasm: Secondary | ICD-10-CM

## 2016-01-08 DIAGNOSIS — E119 Type 2 diabetes mellitus without complications: Secondary | ICD-10-CM

## 2016-01-08 LAB — CBC WITH DIFFERENTIAL/PLATELET
BASO%: 0.7 % (ref 0.0–2.0)
BASOS ABS: 0 10*3/uL (ref 0.0–0.1)
EOS ABS: 0.1 10*3/uL (ref 0.0–0.5)
EOS%: 1.7 % (ref 0.0–7.0)
HCT: 44.1 % (ref 38.4–49.9)
HEMOGLOBIN: 14.6 g/dL (ref 13.0–17.1)
LYMPH%: 28 % (ref 14.0–49.0)
MCH: 31.5 pg (ref 27.2–33.4)
MCHC: 33.1 g/dL (ref 32.0–36.0)
MCV: 95.2 fL (ref 79.3–98.0)
MONO#: 0.5 10*3/uL (ref 0.1–0.9)
MONO%: 11.1 % (ref 0.0–14.0)
NEUT#: 2.8 10*3/uL (ref 1.5–6.5)
NEUT%: 58.5 % (ref 39.0–75.0)
Platelets: 122 10*3/uL — ABNORMAL LOW (ref 140–400)
RBC: 4.63 10*6/uL (ref 4.20–5.82)
RDW: 13.1 % (ref 11.0–14.6)
WBC: 4.7 10*3/uL (ref 4.0–10.3)
lymph#: 1.3 10*3/uL (ref 0.9–3.3)

## 2016-01-08 LAB — CEA (IN HOUSE-CHCC): CEA (CHCC-IN HOUSE): 5.15 ng/mL — AB (ref 0.00–5.00)

## 2016-01-08 NOTE — Progress Notes (Signed)
  [facility] OFFICE PROGRESS NOTE   Diagnosis:  Colon cancer  INTERVAL HISTORY:   Greg Robertson returns as scheduled. No change in bowel habits. No bloody or black stools. No pain with bowel movements. No abdominal pain. He has a good appetite. No weight loss. No nausea or vomiting.  Objective:  Vital signs in last 24 hours:  Blood pressure (!) 161/92, pulse 71, temperature 98.5 F (36.9 C), temperature source Oral, resp. rate 17, height _0  (1.905 m), weight 165 lb 11.2 oz (75.2 kg), SpO2 100 %.    HEENT: No neck mass. Lymphatics: No palpable cervical, supraclavicular, axillary or inguinal lymph nodes. Resp: Lungs clear bilaterally. Cardio: Regular rate and rhythm. GI: Abdomen soft and nontender. No hepatomegaly. No mass. Vascular: No leg edema. Chronic stasis changes at the lower legs bilaterally.   Lab Results:  Lab Results  Component Value Date   WBC 4.7 01/08/2016   HGB 14.6 01/08/2016   HCT 44.1 01/08/2016   MCV 95.2 01/08/2016   PLT 122 (L) 01/08/2016   NEUTROABS 2.8 01/08/2016    Imaging:  No results found.  Medications: I have reviewed the patient's current medications.  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 Tumor microsatellite unstable, K-ras wild type. Adjuvant FOLFOX chemotherapy initiated on 07/14/2011. Cycle 12 given on 01/05/2012.  -Negative surveillance colonoscopy 06/06/2012  -Negative surveillance CT scans 04/25/2013 -Negative surveillance CT scans 07/21/2014 -Negative surveillance colonoscopy 09/10/2014 2. Microcytic anemia. Likely secondary to iron deficiency. Resolved. 3. Insulin-dependent diabetes. 4. Glaucoma. 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 He has seen a Actuary. He has been contacted with updated information regarding screening recommendations. 6. Status post Port-A-Cath placement 07/01/2011. The Port-A-Cath has been removed. 7. History of thrombocytopenia secondary to chemotherapy. Oxaliplatin was held with cycle 3. Oxaliplatin was resumed with cycle 4 at a 25% dose reduction. Oxaliplatin was further dose reduced beginning with cycle 6 due to cytopenias. The oxaliplatin was held with cycle 8, cycle 10 and cycle 11. 8. History of neutropenia secondary to chemotherapy.   Disposition: Greg Robertson remains in clinical remission from colon cancer. The CEA has been mildly elevated intermittently over the past 6 months. Most recent CEA was in normal range. We will follow-up on the CEA from today. If normal we will repeat the CEA in 3 months. If elevated the plan is to proceed with CT scans. Greg Robertson is in agreement with this plan.  We scheduled a return visit in 6 months. He will contact the office in the interim with any problems. We will contact him with the CEA value from today.  We made a referral to Dr. Ardis Hughs for GI follow-up based on the history of Lynch syndrome with PMS2 mutation.  Plan reviewed with Dr. Benay Spice. 25 minutes were spent face-to-face at today's visit with the majority of that time involved in counseling/coordination of care.    Ned Card ANP/GNP-BC   01/08/2016  10:53 AM

## 2016-01-08 NOTE — Telephone Encounter (Signed)
GAVE PATIENT AVS REPORT AND APPOINTMENTS FOR December AND MARCH. GI REFERRAL ROUTED TO [facility] - [facility] WILL CALL RE APPOINTMENT - PATIENT AWARE.

## 2016-01-08 NOTE — Telephone Encounter (Signed)
I notified [name] the CEA remains mildly elevated. We will proceed with restaging CT scans as previously discussed.

## 2016-01-09 LAB — CEA: CEA1: 5.3 ng/mL — AB (ref 0.0–4.7)

## 2016-01-11 ENCOUNTER — Telehealth: Payer: Self-pay | Admitting: Gastroenterology

## 2016-01-12 ENCOUNTER — Telehealth: Payer: Self-pay | Admitting: Family Medicine

## 2016-01-12 MED ORDER — INSULIN DEGLUDEC 100 UNIT/ML ~~LOC~~ SOPN: 16.0000 [IU] | PEN_INJECTOR | SUBCUTANEOUS | 0 refills | Status: DC

## 2016-01-12 NOTE — Telephone Encounter (Signed)
Happy to.  He needs rov with me, next available appt, put on wait list as well. Thanks

## 2016-01-12 NOTE — Telephone Encounter (Signed)
#  1 pen of Tresibia sample left for patient to pick up. Patient notified

## 2016-01-14 ENCOUNTER — Ambulatory Visit (HOSPITAL_COMMUNITY)
Admission: RE | Admit: 2016-01-14 | Discharge: 2016-01-14 | Disposition: A | Discharge status: Home / Self Care | Payer: BLUE CROSS/BLUE SHIELD | Source: Ambulatory Visit | Attending: Nurse Practitioner | Admitting: Nurse Practitioner

## 2016-01-14 ENCOUNTER — Encounter (HOSPITAL_COMMUNITY): Payer: Self-pay

## 2016-01-14 DIAGNOSIS — I700 Atherosclerosis of aorta: Secondary | ICD-10-CM | POA: Insufficient documentation

## 2016-01-14 DIAGNOSIS — C182 Malignant neoplasm of ascending colon: Secondary | ICD-10-CM | POA: Diagnosis present

## 2016-01-14 DIAGNOSIS — Z1509 Genetic susceptibility to other malignant neoplasm: Secondary | ICD-10-CM | POA: Insufficient documentation

## 2016-01-14 IMAGING — CT CT ABD-PELV W/ CM
3 of 5 series · 17 of 46 positions shown, 19 images · IV contrast (iopamidol)
Comparison: [DATE]

CLINICAL DATA: Followup colon carcinoma. Elevated CEA level. Lynch
syndrome. Previous right colectomy and chemotherapy.

EXAM:
CT CHEST, ABDOMEN, AND PELVIS WITH CONTRAST
TECHNIQUE: Multidetector CT imaging of the chest, abdomen and pelvis was
performed following the standard protocol during bolus
administration of intravenous contrast.
CONTRAST:  100mL [1B] IOPAMIDOL ([1B]) INJECTION 61%

[Series 2: cap with st · axial · 0.75mm/px · z∈[-696,-111]mm · 10 of 144 slices shown, 12 images]
[im 14/144  soft-tissue]
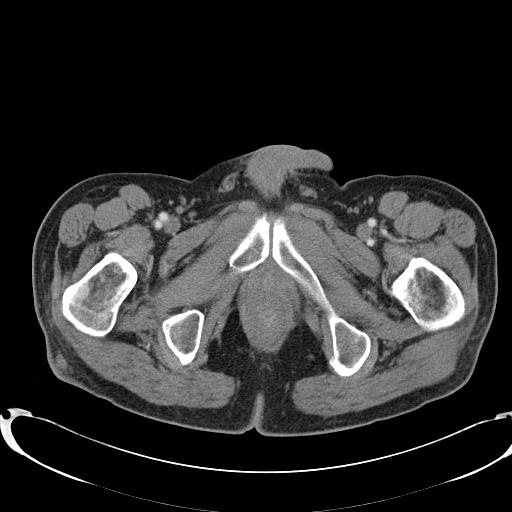
[im 14/144  bone]
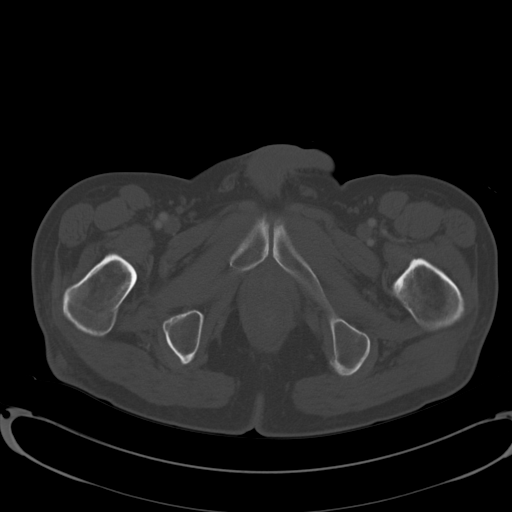
[im 27/144  soft-tissue]
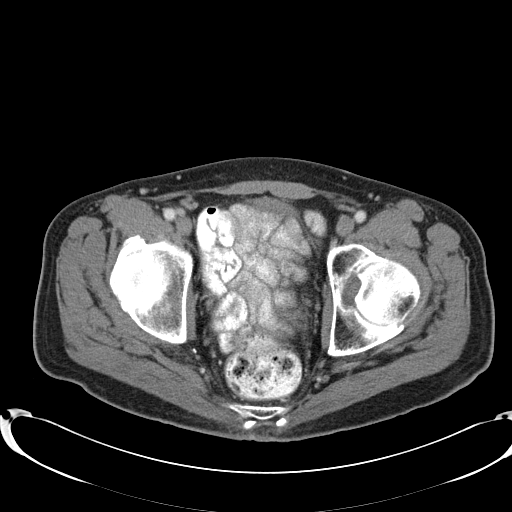
[im 40/144  soft-tissue]
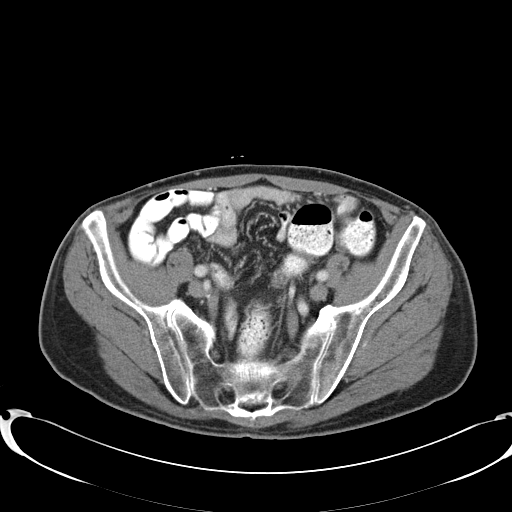
[im 53/144  soft-tissue]
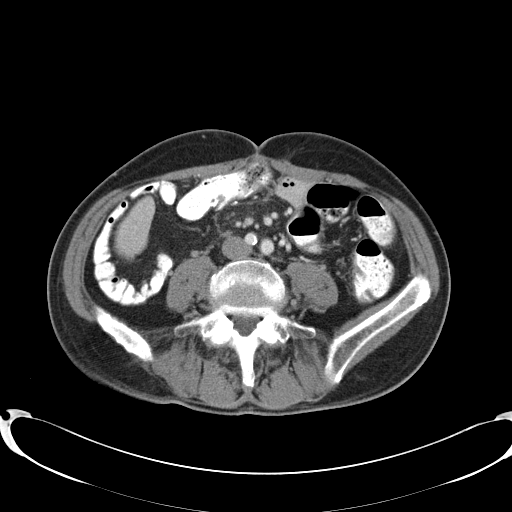
[im 66/144  soft-tissue]
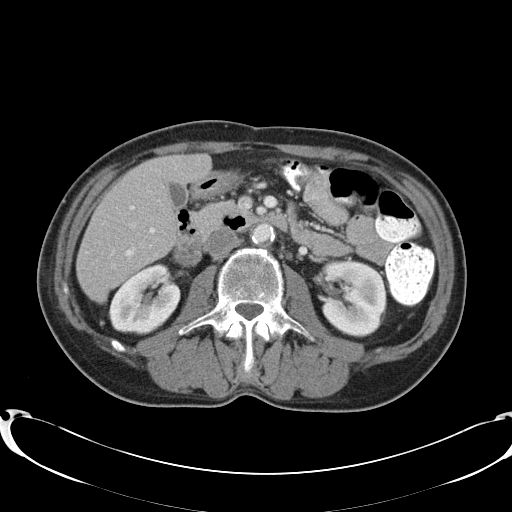
[im 79/144  soft-tissue]
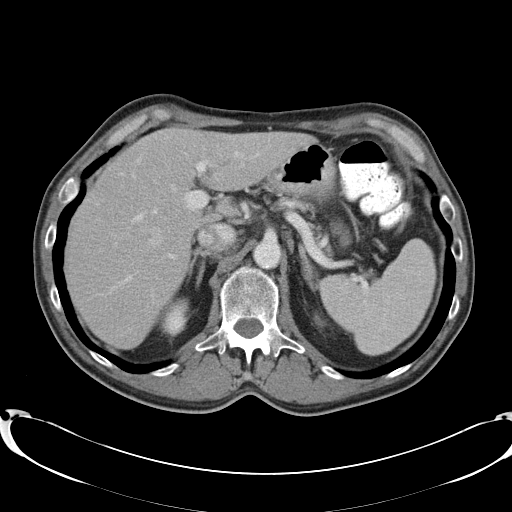
[im 92/144  soft-tissue]
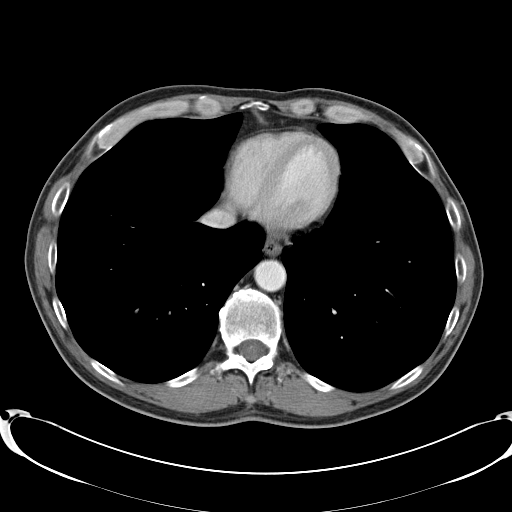
[im 105/144  soft-tissue]
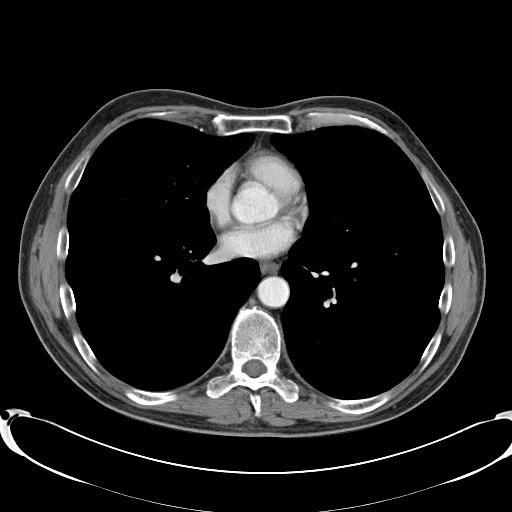
[im 118/144  soft-tissue]
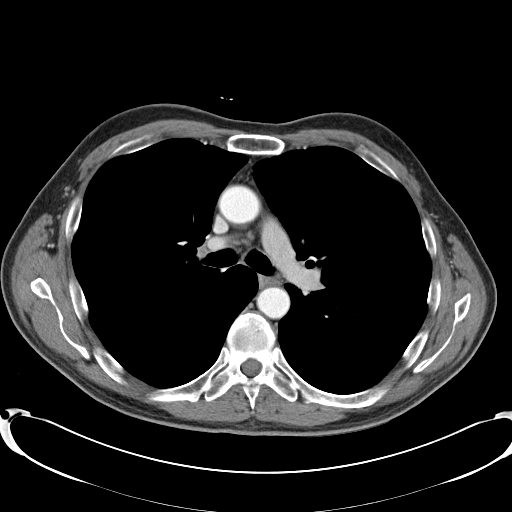
[im 118/144  bone]
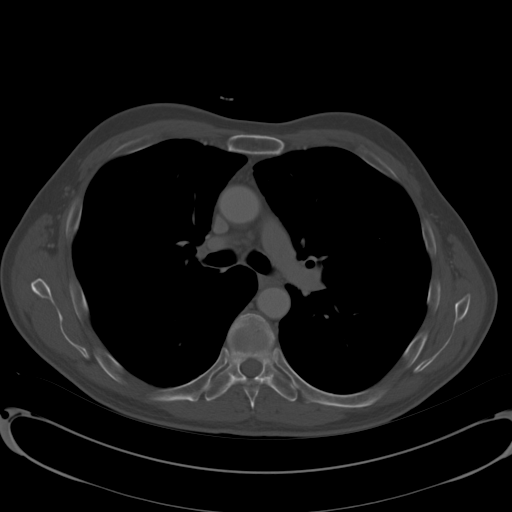
[im 131/144  soft-tissue]
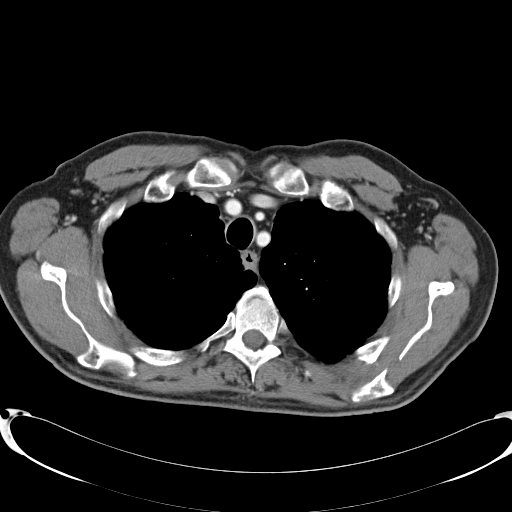

[Series 5: lung windows · axial · 0.75mm/px · z∈[-416,-268]mm · 4 of 198 slices shown]
[im 13/198  bone]
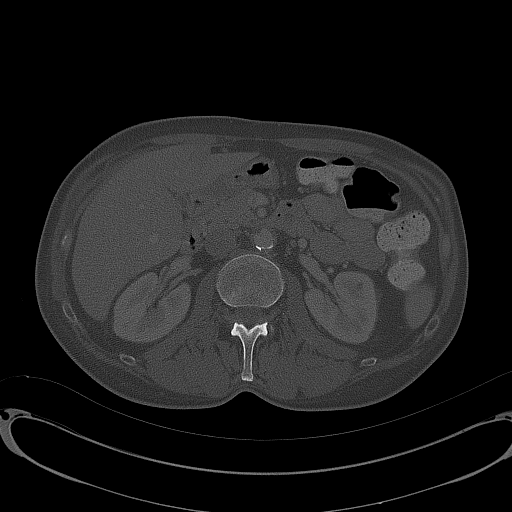
[im 37/198  bone]
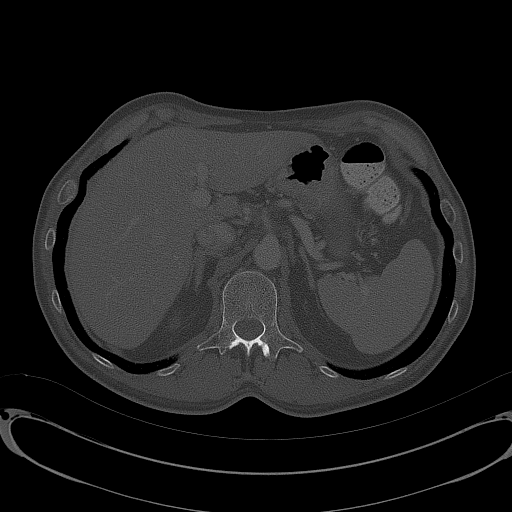
[im 62/198  bone]
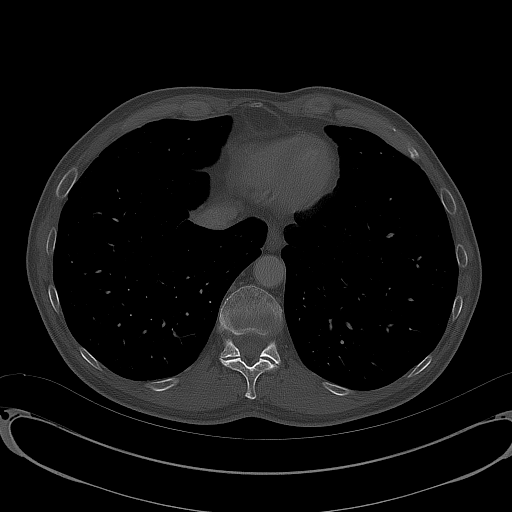
[im 87/198  bone]
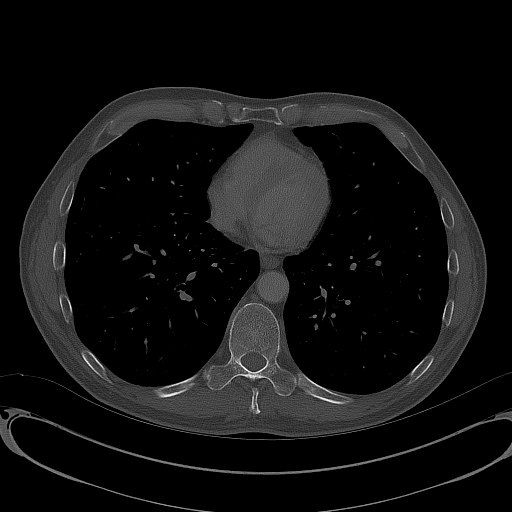

[Series 602: <mpr thick range> · coronal · 1.40mm/px · 3 of 131 slices shown]
[im 44/131  soft-tissue]
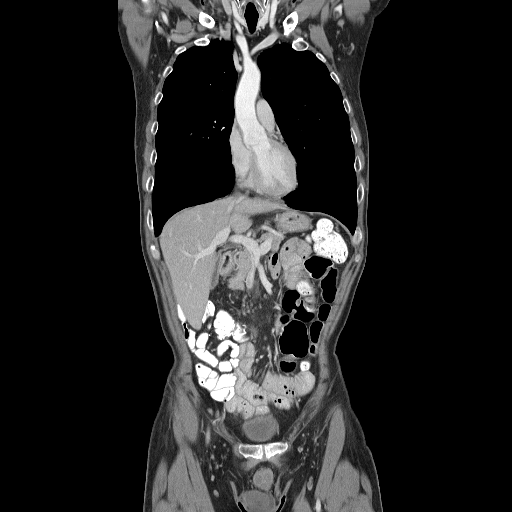
[im 58/131  soft-tissue]
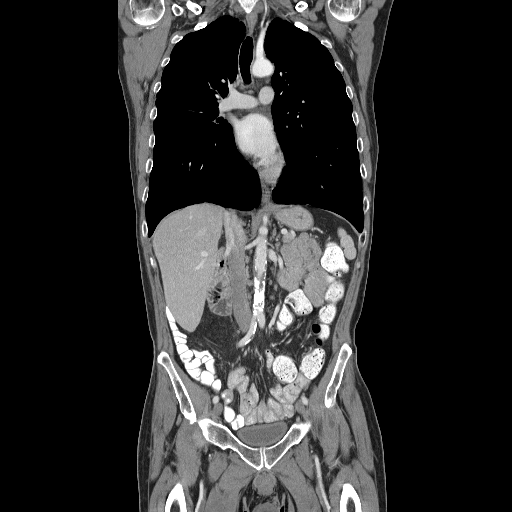
[im 73/131  soft-tissue]
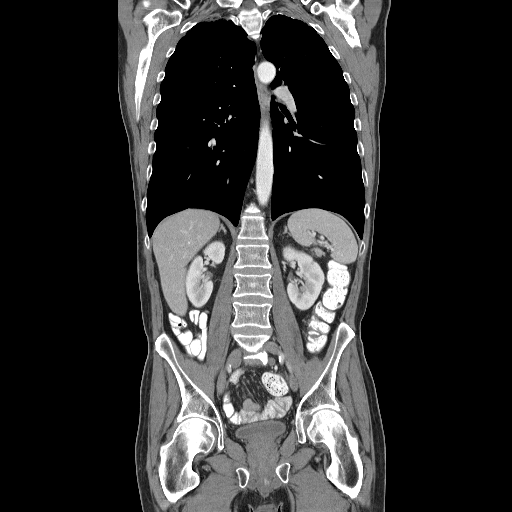

[17 of 46 positions shown; findings below may reference images not displayed]

FINDINGS: CT CHEST FINDINGS

Cardiovascular:  No significant abnormality.

Mediastinum/Lymph Nodes: No masses or pathologically enlarged lymph
nodes identified.

Lungs/Pleura: No pulmonary infiltrate or mass identified. No
effusion present. Biapical scarring remains stable. 4 mm pulmonary
nodule in the anterior lingula remains stable since older studies
dating back to [1B], consistent with benign etiology.

Musculoskeletal: No suspicious bone lesions or other significant
abnormality.

CT ABDOMEN AND PELVIS FINDINGS

Hepatobiliary: No masses identified. Gallbladder is unremarkable.

Pancreas:  No mass or inflammatory changes.

Spleen:  Within normal limits in size and appearance.

Adrenals/Urinary tract: No masses or hydronephrosis. Probable tiny
sub-cm left renal cyst remains stable. Unopacified urinary bladder
is unremarkable in appearance.

Stomach/Bowel: Stable postop changes from right hemicolectomy. No
masses identified. No evidence of obstruction, inflammatory process,
or abnormal fluid collections.

Vascular/Lymphatic: No pathologically enlarged lymph nodes
identified. No abdominal aortic aneurysm. Aortic atherosclerosis.

Reproductive:  No mass or other significant abnormality identified.

Other:  None.

Musculoskeletal:  No suspicious bone lesions identified.
IMPRESSION: Stable exam. No evidence of recurrent or metastatic carcinoma within
the chest, abdomen, or pelvis. No other acute findings identified.

Aortic atherosclerosis.

## 2016-01-14 MED ORDER — IOPAMIDOL (ISOVUE-300) INJECTION 61%
100.0000 mL | Freq: Once | INTRAVENOUS | Status: AC | PRN
  Administered 2016-01-14: 100 mL via INTRAVENOUS

## 2016-01-18 ENCOUNTER — Telehealth: Payer: Self-pay | Admitting: *Deleted

## 2016-01-18 NOTE — Telephone Encounter (Signed)
-----   Message from Ladell Pier, MD sent at 01/15/2016  4:41 PM EDT ----- Please call patient, CT is negative for cancer, f/u as scheduled

## 2016-01-18 NOTE — Telephone Encounter (Signed)
Message left on patient's private phone to inform him that CT is negative for cancer per Dr. Benay Spice and to f/u as scheduled.  Informed patient to call [facility] back with any questions or concerns.

## 2016-02-02 ENCOUNTER — Ambulatory Visit: Payer: Self-pay | Admitting: Pharmacist

## 2016-02-03 ENCOUNTER — Ambulatory Visit (INDEPENDENT_AMBULATORY_CARE_PROVIDER_SITE_OTHER): Payer: Medicare Other | Admitting: Pharmacist

## 2016-02-03 ENCOUNTER — Encounter: Payer: Self-pay | Admitting: Family Medicine

## 2016-02-03 ENCOUNTER — Encounter: Payer: Self-pay | Admitting: Pharmacist

## 2016-02-03 VITALS — BP 150/62 | HR 68 | Ht 75.0 in | Wt 166.0 lb

## 2016-02-03 DIAGNOSIS — Z23 Encounter for immunization: Secondary | ICD-10-CM | POA: Diagnosis not present

## 2016-02-03 DIAGNOSIS — I10 Essential (primary) hypertension: Secondary | ICD-10-CM

## 2016-02-03 DIAGNOSIS — E1065 Type 1 diabetes mellitus with hyperglycemia: Secondary | ICD-10-CM | POA: Diagnosis not present

## 2016-02-03 MED ORDER — INSULIN DEGLUDEC 100 UNIT/ML ~~LOC~~ SOPN: 16.0000 [IU] | PEN_INJECTOR | SUBCUTANEOUS | 1 refills | Status: DC

## 2016-02-03 NOTE — Patient Instructions (Signed)
Take lisinopril 2.5mg  - 1 tablet EVERY day.   Goal Blood glucose:    Fasting (before meals) = 80 to 130   Within 2 hours of eating = less than 180

## 2016-02-03 NOTE — Progress Notes (Signed)
Patient ID: Greg Robertson, male   DOB: 1951/01/11, 65 y.o.   MRN: [id number]    Subjective:      Greg Robertson is a 65 y.o. male who presents for evaluation of Type 1 diabetes mellitus and HTN.  The initial diagnosis of diabetes was made several years ago.    His clinical course has fluctuated. Insulin dosage review with Novian suggested compliance almost every day.    He is currently taking Novolog prior to each meal and at bedtime if BG is elevated - uses CHO Ratio of 1 unit of insulin per every 6 grams of CHO with a correction factor of 50 (gives 1 unit of insulin per each 50 points BG is greater than 100) and Tresiba 16 to19 units units at bedtime (had decreased to 16 units but pt slowly increased up due to increase in BG). He is currently taking 19 units once a day.   Insulin injections are given by patient.     He is checking BG about 2 times a day. Ranges from 60 to 300 Meal panning: He is using carbohydrate counting Exercise: every other day   BP at last office visit was elevated at 158/82.  BP is elevated in office today but improved.   Patient reports he has been taking lisinopril 2.5mg  about every other day - but not daily.  Objective:    BP (!) 150/62   Pulse 68   Ht 6\' 3"  (1.905 m)   Wt 166 lb (75.3 kg)   BMI 20.75 kg/m    Lab Review      Hemoglobin A1C - 8.5% (12/24/2015)      hemoglobin A1C - 10.2% (07/23/2015)     Assessment:    Diabetes Mellitus type I, under inadequate but improved control.  I am concerned that BG is low in am but seems to gradually increase.  It is difficult to get a real sense of what is gling on since he checks only 1-2 times a day HTN - uncontrolled but improving    Plan:    1.  RX changes: No changesin insulin today.   Reminded patient that he needs to take  lisinopril 2.5mg  Every day. 2.  Tried to get urine microalbumin which is past due but patient states he is unable to void. 3.  Influenza vaccine given today 4.   Discussed with his PCP and Continuous Glucose monitor was placed today to try to get better understanding of BG trends.  Patient educated about proper care of CGM and site.  CGM was placed on back of upper left arm.  Patient will wear at least 5 days and up to 14 days.  He is to keep BG, diet and insulin administration records. He can engage in regular activities with special care when showering, changing clothes and swimming (only up to 3 feet and for 30 minutes at a time)  5.   Follow up: 14 days to download CGM and discuss changes recommended to adjust insulin/ recheck BP and review compliance.  Orders Placed This Encounter  Procedures  . Flu Vaccine QUAD 36+ mos IM    Cherre Robins, PharmD, CPP, CDE

## 2016-02-17 ENCOUNTER — Ambulatory Visit (INDEPENDENT_AMBULATORY_CARE_PROVIDER_SITE_OTHER): Payer: Medicare Other | Admitting: Pharmacist

## 2016-02-17 VITALS — BP 144/68 | HR 70 | Ht 75.0 in | Wt 166.5 lb

## 2016-02-17 DIAGNOSIS — E10649 Type 1 diabetes mellitus with hypoglycemia without coma: Secondary | ICD-10-CM

## 2016-02-17 DIAGNOSIS — I10 Essential (primary) hypertension: Secondary | ICD-10-CM

## 2016-02-17 DIAGNOSIS — E1065 Type 1 diabetes mellitus with hyperglycemia: Secondary | ICD-10-CM

## 2016-02-17 NOTE — Patient Instructions (Signed)
Increase Tresiba to 19 units once each morning for 1 week.  If you do not have any low readings then increase to 20 units.  Hypoglycemia Hypoglycemia occurs when the glucose in your blood is too low. Glucose is a type of sugar that is your body's main energy source. Hormones, such as insulin and glucagon, control the level of glucose in the blood. Insulin lowers blood glucose and glucagon increases blood glucose. Having too much insulin in your blood stream, or not eating enough food containing sugar, can result in hypoglycemia. Hypoglycemia can happen to people with or without diabetes. It can develop quickly and can be a medical emergency.  TREATMENT  Usually, you can easily treat your hypoglycemia when you notice symptoms.  Check your blood glucose. If it is less than 70 mg/dl, take one of the following / "quick glucose or sugar"  3-4 glucose tablets.    cup juice.    cup regular soda.   1 cup skim milk.   -1 tube of glucose gel.   5-6 hard candies.   Avoid high-fat drinks or food that may delay a rise in blood glucose levels.  Do not take more than the recommended amount of sugary foods, drinks, gel, or tablets. Doing so will cause your blood glucose to go too high.   Wait 15 minutes and recheck your blood glucose. If it is still less than 70 mg/dl or below your target range, repeat treatment.   If your blood glucose increases to over 70 then either eat a snack if it is more than 1 hour until your next meal or eat your meal.    Recommend giving half of your usual insulin calculation for Novolog  HOME CARE INSTRUCTIONS  If you have diabetes, follow your diabetes management plan by:  Taking your medicines as directed.  Following your exercise plan.  Following your meal plan. Do not skip meals. Eat on time.  Testing your blood glucose regularly. Check your blood glucose before and after exercise. If you exercise longer or different than usual, be sure to check  blood glucose more frequently.  Wearing your medical alert jewelry that says you have diabetes.  Identify the cause of your hypoglycemia. Then, develop ways to prevent the recurrence of hypoglycemia.  Do not take a hot bath or shower right after an insulin shot.  Always carry treatment with you. Glucose tablets are the easiest to carry.  If you are going to drink alcohol, drink it only with meals.  Tell friends or family members ways to keep you safe during a seizure. This may include removing hard or sharp objects from the area or turning you on your side.  Maintain a healthy weight. SEEK MEDICAL CARE IF:   You are having problems keeping your blood glucose in your target range.  You are having frequent episodes of hypoglycemia.  You feel you might be having side effects from your medicines.  You are not sure why your blood glucose is dropping so low.  You notice a change in vision or a new problem with your vision. SEEK IMMEDIATE MEDICAL CARE IF:   Confusion develops.  A change in mental status occurs.  The inability to swallow develops.  Fainting occurs.   This information is not intended to replace advice given to you by your health care provider. Make sure you discuss any questions you have with your health care provider.   Document Released: 04/04/2005 Document Revised: 04/09/2013 Document Reviewed: 12/09/2014 Elsevier Interactive Patient Education  2016 [employer].

## 2016-02-17 NOTE — Progress Notes (Signed)
Patient ID: Greg Robertson, male   DOB: [birth year], 65 y.o.   MRN: [id number]   Subjective:      Greg Robertson is a 65 y.o. male who presents for evaluation of Type 1 diabetes mellitus and HTN. A GCM was placed 2 weeks ago and he is here today to remove CGM and discuss results.  Insulin dosage review with [place] suggested compliance almost every day.    He is currently taking Novolog prior to each meal.  He uses CHO Ratio of 1 unit of insulin per every 6 grams of CHO with a correction factor of 50 (gives 1 unit of insulin per each 50 points BG is greater than 100) and Tresiba 18 units units qam Iinjections are given by patient.   Results of CGM will be scanned into his record: Avg BG was 221, with estimated A1c of 9.3% He had 8 episodes of hypoglycemia with occurred at variable times of day.  2 days in a row (10/22 and 10/23)when he was hyperglycemia all day long - the previous day (10/21) per his long indicated that he did not check BG at all. Also 10/27 and 10/28 he spend a large portion of day in hyperglycemic range.  Again his records indicate that he only checked BG once on 10/27     He is checking BG about 2 times a day. Ranges from 58 to 300's Meal panning: He is using carbohydrate counting Exercise: every other day   BP at last office visit was elevated at 150/62.  BP is elevated in office today but improved.   Patient reports he has been taking lisinopril 2.5mg .  Objective:    BP (!) 144/68   Pulse 70   Ht 6\' 3"  (1.905 m)   Wt 166 lb 8 oz (75.5 kg)   BMI 20.81 kg/m    Lab Review      Hemoglobin A1C - 8.5% (12/24/2015)      hemoglobin A1C - 10.2% (07/23/2015)     Assessment:    Diabetes Mellitus type I, under inadequate but improved control. BG is very variable.  Definitely tends to get hyperglycemic when patient is not checking BG regularly Hypoglycemia unawareness HTN - uncontrolled but improving    Plan:    1.  RX changes:   Increase Tresiba to 19  units each morning for 1 week.  If after 1 week BG is still over 150 in am and no hypoglycemia then increase to 20 units. 2.  Reviewed plan for treating hypoglycemia.  Reminded that it is important when BG is low that he continue to check BG every 15 to 20 minutes until BG is over 70 especially since he has hypoglycemia unawareness.    When eating a meal after a hypoglycemic event- he is instructed give half of his usual amount of insulin calculated and continue to monitor BG in 1 hour with correction as needed.  3.   Follow up: will see PCP in 6 weeks - will follow up by phone in 7-10 days  No orders of the defined types were placed in this encounter.   Cherre Robins, PharmD, CPP, CDE

## 2016-02-18 ENCOUNTER — Encounter: Payer: Self-pay | Admitting: Pharmacist

## 2016-03-15 ENCOUNTER — Encounter: Payer: Self-pay | Admitting: Nurse Practitioner

## 2016-03-25 ENCOUNTER — Telehealth: Payer: Self-pay

## 2016-03-25 NOTE — Telephone Encounter (Signed)
Left message with pt to call back. [facility] has been trying to get in touch with pt for an appt.

## 2016-03-30 ENCOUNTER — Ambulatory Visit: Payer: BLUE CROSS/BLUE SHIELD | Admitting: Family Medicine

## 2016-03-31 ENCOUNTER — Telehealth: Payer: Self-pay | Admitting: Family Medicine

## 2016-03-31 ENCOUNTER — Encounter: Payer: Self-pay | Admitting: Family Medicine

## 2016-03-31 NOTE — Telephone Encounter (Signed)
LM to call and reschedule appt with [facility]

## 2016-04-01 ENCOUNTER — Telehealth: Payer: Self-pay | Admitting: Pharmacist

## 2016-04-01 NOTE — Telephone Encounter (Signed)
Called patient to follow up on HBG readings. No answer but LM on VM to call office to report HBG readings.

## 2016-04-03 ENCOUNTER — Other Ambulatory Visit: Payer: Self-pay | Admitting: Family Medicine

## 2016-04-08 ENCOUNTER — Other Ambulatory Visit: Payer: Self-pay

## 2016-05-04 ENCOUNTER — Other Ambulatory Visit: Payer: Self-pay | Admitting: Nurse Practitioner

## 2016-05-27 ENCOUNTER — Ambulatory Visit (INDEPENDENT_AMBULATORY_CARE_PROVIDER_SITE_OTHER): Payer: Medicare Other | Admitting: Family Medicine

## 2016-05-27 ENCOUNTER — Encounter: Payer: Self-pay | Admitting: Family Medicine

## 2016-05-27 VITALS — BP 124/73 | HR 108 | Temp 100.2°F | Ht 75.0 in | Wt 163.0 lb

## 2016-05-27 DIAGNOSIS — E1069 Type 1 diabetes mellitus with other specified complication: Secondary | ICD-10-CM | POA: Diagnosis not present

## 2016-05-27 DIAGNOSIS — I480 Paroxysmal atrial fibrillation: Secondary | ICD-10-CM

## 2016-05-27 DIAGNOSIS — N410 Acute prostatitis: Secondary | ICD-10-CM

## 2016-05-27 DIAGNOSIS — R509 Fever, unspecified: Secondary | ICD-10-CM | POA: Diagnosis not present

## 2016-05-27 DIAGNOSIS — E785 Hyperlipidemia, unspecified: Secondary | ICD-10-CM | POA: Diagnosis not present

## 2016-05-27 DIAGNOSIS — I739 Peripheral vascular disease, unspecified: Secondary | ICD-10-CM | POA: Diagnosis not present

## 2016-05-27 DIAGNOSIS — R300 Dysuria: Secondary | ICD-10-CM

## 2016-05-27 DIAGNOSIS — N390 Urinary tract infection, site not specified: Secondary | ICD-10-CM

## 2016-05-27 DIAGNOSIS — C182 Malignant neoplasm of ascending colon: Secondary | ICD-10-CM | POA: Diagnosis not present

## 2016-05-27 LAB — URINALYSIS
BILIRUBIN UA: NEGATIVE
Bilirubin, UA: NEGATIVE
NITRITE UA: POSITIVE — AB
NITRITE UA: POSITIVE — AB
PH UA: 5.5 (ref 5.0–7.5)
Specific Gravity, UA: 1.015 (ref 1.005–1.030)
Specific Gravity, UA: 1.025 (ref 1.005–1.030)
UUROB: 0.2 mg/dL (ref 0.2–1.0)
UUROB: 0.2 mg/dL (ref 0.2–1.0)
pH, UA: 5.5 (ref 5.0–7.5)

## 2016-05-27 MED ORDER — SULFAMETHOXAZOLE-TRIMETHOPRIM 800-160 MG PO TABS: 1.0000 | ORAL_TABLET | Freq: Two times a day (BID) | ORAL | 0 refills | Status: DC

## 2016-05-27 NOTE — Progress Notes (Signed)
Subjective:    Patient ID: Greg Robertson, male    DOB: 04-19-1950, 66 y.o.   MRN: [id number]  HPI Patient here today for dysuria and frequency. The symptoms started 2 days ago. He also states he has a slight cough and nasal drainage.The patient has had a slight cough and nasal congestion. He is been going more frequently to the bathroom and has had some burning with passing his water. The patient has also had a rash around the glans penis. He denies any nausea vomiting or diarrhea. He is not coughing up any sputum. He did not think that he is been running any fever until the temperature was checked this morning. We will give him to exercise Tylenol here he understands to stay on Tylenol regularly    Patient Active Problem List   Diagnosis Date Noted  . Thrombocytopenia ([place]) 01/22/2015  . Hyperlipidemia due to type 1 diabetes mellitus ([place]) 12/12/2013  . Nausea and vomiting 04/06/2013  . Diabetes 1.5, managed as type 1 ([place]) 10/29/2012  . HTN (hypertension) 03/14/2012  . Chronic diastolic heart failure, NYHA class 1 ([place]) 03/12/2012  . Demand ischemia ([place]) 03/12/2012  . Fracture of left clavicle-distal 03/12/2012  . NSTEMI (non-ST elevated myocardial infarction) ([place]) 03/11/2012  . Atrial fibrillation with RVR ([place]) 03/11/2012  . Diabetic ketoacidosis ([place]) 03/10/2012  . Dehydration, severe 03/10/2012  . Right bundle branch block 03/10/2012  . Atrial fibrillation by electrocardiogram ([place]) 03/10/2012  . Elevated troponin I level 03/10/2012  . Lynch syndrome 07/28/2011  . Stage III (T3 N1) disease poorly differentiated adenocarcinoma of the right colon status post right colectomy on 06/02/2011 05/25/2011  . Nonspecific abnormal finding in stool contents 05/10/2011  . Diabetes mellitus type 2, uncontrolled, without complications ([place]) 27/78/2423  . Glaucoma 05/10/2011   Outpatient Encounter Prescriptions as of 05/27/2016  Medication Sig  . acetaminophen (TYLENOL) 500 MG tablet  Take 1,000 mg by mouth every 6 (six) hours as needed. Pain   . Ascorbic Acid (VITAMIN C PO) Take 1 tablet by mouth every morning.   Marland Kitchen aspirin EC 81 MG tablet Take 81 mg by mouth daily.  Marland Kitchen glucose blood test strip Use as instructed  . insulin aspart (NOVOLOG) 100 UNIT/ML injection 8 TO 15 UNITS INTO SKIN THREE TIMES A DAY BEFORE MEALS-TAKE 1 UNIT FOR EVERY 6GMS OF CARBS + CORR 1 un/50  . insulin degludec (TRESIBA FLEXTOUCH) 100 UNIT/ML SOPN FlexTouch Pen Inject 0.16-0.2 mLs (16-20 Units total) into the skin every morning.  . latanoprost (XALATAN) 0.005 % ophthalmic solution Place 1 drop into both eyes at bedtime.  Marland Kitchen levocetirizine (XYZAL) 5 MG tablet TAKE ONE TABLET BY MOUTH ONE TIME DAILY  . lisinopril (PRINIVIL,ZESTRIL) 2.5 MG tablet Take 1 tablet (2.5 mg total) by mouth daily.  . Multiple Vitamin (MULTIVITAMIN) tablet Take 1 tablet by mouth every morning.   . simvastatin (ZOCOR) 40 MG tablet Take 1 tablet (40 mg total) by mouth at bedtime.  . triamcinolone (NASACORT) 55 MCG/ACT AERO nasal inhaler INSTILL 1 SPRAY IN EACH NOSTRIL ONCE OR TWICE DAILY AS NEEDED.  . Vitamin D, Ergocalciferol, (DRISDOL) 50000 units CAPS capsule TAKE 1 CAPSULE (50,000 UNITS TOTAL) BY MOUTH EVERY 7 (SEVEN) DAYS.  . [DISCONTINUED] Cholecalciferol (VITAMIN D) 2000 units CAPS Take by mouth.   No facility-administered encounter medications on file as of 05/27/2016.       Review of Systems  Constitutional: Negative.   HENT: Positive for postnasal drip.   Eyes: Negative.   Respiratory: Positive for  cough (slight).   Cardiovascular: Negative.   Gastrointestinal: Negative.   Endocrine: Negative.   Genitourinary: Positive for dysuria and frequency.       Rash  Musculoskeletal: Negative.   Skin: Negative.   Allergic/Immunologic: Negative.   Neurological: Negative.   Hematological: Negative.   Psychiatric/Behavioral: Negative.        Objective:   Physical Exam  Constitutional: He is oriented to person, place,  and time. He appears well-developed and well-nourished. No distress.  HENT:  Head: Normocephalic and atraumatic.  Right Ear: External ear normal.  Left Ear: External ear normal.  Mouth/Throat: Oropharynx is clear and moist. No oropharyngeal exudate.  Is some nasal congestion bilaterally. The throat was clear posteriorly.  Eyes: Conjunctivae and EOM are normal. Pupils are equal, round, and reactive to light. Right eye exhibits no discharge. Left eye exhibits no discharge. No scleral icterus.  Neck: Normal range of motion. Neck supple. No thyromegaly present.  Cardiovascular: Normal rate, regular rhythm and normal heart sounds.   No murmur heard. Pulmonary/Chest: Effort normal and breath sounds normal. No respiratory distress. He has no wheezes. He has no rales.  Chest is clear anteriorly and posteriorly. There is no congestion with coughing.  Abdominal: Soft. Bowel sounds are normal. He exhibits no mass. There is no tenderness. There is no rebound and no guarding.  No abdominal tenderness or organ enlargement  Genitourinary: Rectum normal and penis normal.  Genitourinary Comments: The prostate was minimally enlarged but soft and congested left greater than right. There was no tenderness with palpation and no burning with palpating the prostate noted by the patient.  Musculoskeletal: Normal range of motion. He exhibits no edema.  Lymphadenopathy:    He has no cervical adenopathy.  Neurological: He is alert and oriented to person, place, and time. He has normal reflexes. No cranial nerve deficit.  Skin: Skin is warm and dry. No rash noted.  Minimal redness around the glans penis.  Psychiatric: He has a normal mood and affect. His behavior is normal. Judgment and thought content normal.  Nursing note and vitals reviewed.  BP 124/73 (BP Location: Left Arm)   Pulse (!) 108   Temp 100.2 F (37.9 C) (Oral)   Ht 6' 3"  (1.905 m)   Wt 163 lb (73.9 kg)   BMI 20.37 kg/m    The dipstick urine  was nitrite positive and leukocyte positive.     Assessment & Plan:  1. Dysuria -We will get a second urine for culture and sensitivity. -Empirically we will go ahead and start him on Septra DS 1 twice daily for 10 days. He was encouraged to take this with food. - Urine culture - Urinalysis - BMP8+EGFR - Urinalysis  2. Fever, unspecified fever cause -Check extra strength Tylenol every 4-6 hours for fever. -Take Mucinex twice daily and use nasal saline for head congestion - BMP8+EGFR - CBC with Differential/Platelet  3. Malignant neoplasm of ascending colon [facility]) -The patient continues to be followed by the gastroenterologist and all reports from the specialists have been good.  4. Paroxysmal atrial fibrillation (HCC) -The patient is in normal sinus rhythm today.  5. Peripheral vascular insufficiency ([place])  6. Hyperlipidemia due to type 1 diabetes mellitus ([place]) -Continue monitoring blood sugars and following diet as closely as possible  7. Urinary tract infection without hematuria, site unspecified -Antibiotic antibiotic as directed twice daily with food  8. Acute prostatitis -Drink plenty of fluids and take antibiotic regularly.  Meds ordered this encounter  Medications  .  sulfamethoxazole-trimethoprim (BACTRIM DS) 800-160 MG tablet    Sig: Take 1 tablet by mouth 2 (two) times daily.    Dispense:  20 tablet    Refill:  0   Patient Instructions  Take antibiotic twice daily with food until completed Recheck urinalysis in a couple of weeks We will call with results of culture and sensitivity when these results become available We will call with lab work as soon as that becomes available Take Tylenol regularly for fever control, drink plenty of fluids and monitor blood sugars closely  Arrie Senate MD

## 2016-05-27 NOTE — Patient Instructions (Signed)
Take antibiotic twice daily with food until completed Recheck urinalysis in a couple of weeks We will call with results of culture and sensitivity when these results become available We will call with lab work as soon as that becomes available Take Tylenol regularly for fever control, drink plenty of fluids and monitor blood sugars closely

## 2016-05-27 NOTE — Addendum Note (Signed)
Addended by: Zannie Cove on: 05/27/2016 04:02 PM   Modules accepted: Orders

## 2016-05-28 LAB — CBC WITH DIFFERENTIAL/PLATELET
BASOS: 0 %
Basophils Absolute: 0 10*3/uL (ref 0.0–0.2)
EOS (ABSOLUTE): 0 10*3/uL (ref 0.0–0.4)
EOS: 1 %
HEMATOCRIT: 41.7 % (ref 37.5–51.0)
Hemoglobin: 14 g/dL (ref 13.0–17.7)
IMMATURE GRANS (ABS): 0.1 10*3/uL (ref 0.0–0.1)
Immature Granulocytes: 1 %
LYMPHS: 16 %
Lymphocytes Absolute: 1.5 10*3/uL (ref 0.7–3.1)
MCH: 30.9 pg (ref 26.6–33.0)
MCHC: 33.6 g/dL (ref 31.5–35.7)
MCV: 92 fL (ref 79–97)
MONOCYTES: 16 %
Monocytes Absolute: 1.4 10*3/uL — ABNORMAL HIGH (ref 0.1–0.9)
NEUTROS PCT: 66 %
Neutrophils Absolute: 5.9 10*3/uL (ref 1.4–7.0)
Platelets: 213 10*3/uL (ref 150–379)
RBC: 4.53 x10E6/uL (ref 4.14–5.80)
RDW: 13.4 % (ref 12.3–15.4)
WBC: 8.9 10*3/uL (ref 3.4–10.8)

## 2016-05-28 LAB — BMP8+EGFR
BUN/Creatinine Ratio: 12 (ref 10–24)
BUN: 12 mg/dL (ref 8–27)
CALCIUM: 9.2 mg/dL (ref 8.6–10.2)
CO2: 23 mmol/L (ref 18–29)
CREATININE: 1.03 mg/dL (ref 0.76–1.27)
Chloride: 93 mmol/L — ABNORMAL LOW (ref 96–106)
GFR calc Af Amer: 88 mL/min/{1.73_m2} (ref >59)
GFR, EST NON AFRICAN AMERICAN: 76 mL/min/{1.73_m2} (ref >59)
GLUCOSE: 253 mg/dL — AB (ref 65–99)
Potassium: 4.3 mmol/L (ref 3.5–5.2)
Sodium: 136 mmol/L (ref 134–144)

## 2016-05-30 ENCOUNTER — Other Ambulatory Visit: Payer: Self-pay | Admitting: *Deleted

## 2016-05-30 DIAGNOSIS — N419 Inflammatory disease of prostate, unspecified: Secondary | ICD-10-CM

## 2016-05-31 ENCOUNTER — Other Ambulatory Visit: Payer: Self-pay | Admitting: Family Medicine

## 2016-06-01 LAB — URINE CULTURE

## 2016-06-28 ENCOUNTER — Other Ambulatory Visit: Payer: Self-pay | Admitting: Family Medicine

## 2016-07-08 ENCOUNTER — Telehealth: Payer: Self-pay | Admitting: *Deleted

## 2016-07-08 ENCOUNTER — Ambulatory Visit: Payer: Self-pay | Admitting: Oncology

## 2016-07-08 ENCOUNTER — Other Ambulatory Visit: Payer: Self-pay

## 2016-07-08 NOTE — Telephone Encounter (Signed)
Message left for patient to call [place] back to reschedule missed lab and appt with Dr. Benay Spice.

## 2016-07-13 ENCOUNTER — Other Ambulatory Visit: Payer: Self-pay | Admitting: Pharmacist

## 2016-08-01 ENCOUNTER — Other Ambulatory Visit: Payer: Self-pay | Admitting: Family Medicine

## 2016-12-07 ENCOUNTER — Other Ambulatory Visit: Payer: Self-pay | Admitting: Family Medicine

## 2017-01-04 ENCOUNTER — Encounter (INDEPENDENT_AMBULATORY_CARE_PROVIDER_SITE_OTHER): Payer: Self-pay

## 2017-01-04 ENCOUNTER — Encounter: Payer: Self-pay | Admitting: Family Medicine

## 2017-01-04 ENCOUNTER — Ambulatory Visit (INDEPENDENT_AMBULATORY_CARE_PROVIDER_SITE_OTHER): Payer: Medicare Other | Admitting: Family Medicine

## 2017-01-04 VITALS — BP 161/86 | HR 87 | Temp 97.4°F | Ht 75.0 in | Wt 168.0 lb

## 2017-01-04 DIAGNOSIS — B029 Zoster without complications: Secondary | ICD-10-CM

## 2017-01-04 MED ORDER — VALACYCLOVIR HCL 1 G PO TABS: 1000.0000 mg | ORAL_TABLET | Freq: Three times a day (TID) | ORAL | 0 refills | Status: DC

## 2017-01-04 NOTE — Progress Notes (Signed)
Subjective:  Patient ID: Greg Robertson, male    DOB: [birth year]  Age: 66 y.o. MRN: [id number]  CC: Rash (pt here today c/o rash on his back and back of his neck x 2 days )   HPI Greg Robertson presents for rash that is minimally pruritic but increasing through spreading over his right shoulder for 2-3 days since onset 3 days ago as a single red spot on shoulder. It is red, but not painful. Using moisturizer on it. Denies fever, chills, malaise. Notes that it is limited to the right side.   Depression screen [facility] 2/9 01/04/2017 05/27/2016 11/25/2015  Decreased Interest 0 0 0  Down, Depressed, Hopeless 0 0 0  PHQ - 2 Score 0 0 0    History [place] has a past medical history of Allergic rhinitis; Anemia; Cancer ([place]); Cancer of ascending colon, 7cm (05/25/2011); Colonic mass; Diabetes mellitus; Diabetes mellitus type I ([place]); Glaucoma; Hypertension; Pneumonia (1979 or 1980); Prostate nodule; and Substance abuse.   He has a past surgical history that includes Finger surgery (2009); Nasal fracture surgery (1968); Tonsillectomy (1957 - approximate); LAPAROSCOPIC ASSISTED 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; Portacath placement (07/01/2011); and broken left shoulder blade and collar bone.   His family history includes Breast cancer in his mother; Colon polyps in his father; Diabetes in his father; Insomnia in his mother; Lung cancer in his father; Pancreatitis in his mother.He reports that he quit smoking about 23 years ago. His smoking use included Cigarettes. He has a 15.00 pack-year smoking history. He has never used smokeless tobacco. He reports that he drinks alcohol. He reports that he uses drugs, including Marijuana.    ROS Review of Systems  Constitutional: Negative for chills, diaphoresis and fever.  HENT: Negative for rhinorrhea and sore throat.   Respiratory: Negative for cough and shortness of breath.   Cardiovascular: Negative for chest pain.  Gastrointestinal: Negative  for abdominal pain.  Musculoskeletal: Negative for arthralgias and myalgias.  Skin: Positive for rash.  Neurological: Negative for weakness and headaches.    Objective:  BP (!) 161/86   Pulse 87   Temp (!) 97.4 F (36.3 C) (Oral)   Ht 6\' 3"  (1.905 m)   Wt 168 lb (76.2 kg)   BMI 21.00 kg/m   BP Readings from Last 3 Encounters:  01/04/17 (!) 161/86  05/27/16 124/73  02/17/16 (!) 144/68    Wt Readings from Last 3 Encounters:  01/04/17 168 lb (76.2 kg)  05/27/16 163 lb (73.9 kg)  02/17/16 166 lb 8 oz (75.5 kg)     Physical Exam  Constitutional: He appears well-developed and well-nourished.  HENT:  Head: Normocephalic and atraumatic.  Right Ear: Tympanic membrane and external ear normal. No decreased hearing is noted.  Left Ear: Tympanic membrane and external ear normal. No decreased hearing is noted.  Mouth/Throat: No oropharyngeal exudate or posterior oropharyngeal erythema.  Eyes: Pupils are equal, round, and reactive to light.  Neck: Normal range of motion. Neck supple.  Cardiovascular: Normal rate and regular rhythm.   No murmur heard. Pulmonary/Chest: Breath sounds normal. No respiratory distress.  Abdominal: Soft. Bowel sounds are normal. He exhibits no mass. There is no tenderness.  Skin: Skin is warm and dry. Rash noted. There is erythema. No pallor.  Erythematous eruption covers much of the shoulder girdle. It is raised, nonblanching, morbiliform with suggestion of vesicles. It has a sharp demarcation at the midline posteriorly at the base of the neck. It continues around to the midline  of the upper chest without crossing.   Psychiatric: He has a normal mood and affect. His behavior is normal.  Vitals reviewed.         Assessment & Plan:   Darrol was seen today for rash.  Diagnoses and all orders for this visit:  Herpes zoster without complication  Other orders -     valACYclovir (VALTREX) 1000 MG tablet; Take 1 tablet (1,000 mg total) by mouth 3  (three) times daily.       I have discontinued Mr. Delbuono sulfamethoxazole-trimethoprim and Vitamin D (Ergocalciferol). I am also having him start on valACYclovir. Additionally, I am having him maintain his latanoprost, multivitamin, Ascorbic Acid (VITAMIN C PO), acetaminophen, glucose blood, aspirin EC, triamcinolone, levocetirizine, simvastatin, lisinopril, NOVOLOG, and TRESIBA FLEXTOUCH.  Allergies as of 01/04/2017      Reactions   Oxycodone Nausea And Vomiting      Medication List       Accurate as of 01/04/17  6:52 PM. Always use your most recent med list.          acetaminophen 500 MG tablet Commonly known as:  TYLENOL Take 1,000 mg by mouth every 6 (six) hours as needed. Pain   aspirin EC 81 MG tablet Take 81 mg by mouth daily.   glucose blood test strip Use as instructed   latanoprost 0.005 % ophthalmic solution Commonly known as:  XALATAN Place 1 drop into both eyes at bedtime.   levocetirizine 5 MG tablet Commonly known as:  XYZAL TAKE ONE TABLET BY MOUTH ONE TIME DAILY   lisinopril 2.5 MG tablet Commonly known as:  PRINIVIL,ZESTRIL TAKE 1 TABLET EVERY DAY   multivitamin tablet Take 1 tablet by mouth every morning.   NOVOLOG 100 UNIT/ML injection Generic drug:  insulin aspart INJECT 8-15 UNITS THREE TIMES DAILY BEFORE MEALS   simvastatin 40 MG tablet Commonly known as:  ZOCOR Take 1 tablet (40 mg total) by mouth at bedtime.   TRESIBA FLEXTOUCH 100 UNIT/ML Sopn FlexTouch Pen Generic drug:  insulin degludec INJECT 0.16-0.2 MLS (16-20 UNITS TOTAL) INTO THE SKIN EVERY MORNING.   triamcinolone 55 MCG/ACT Aero nasal inhaler Commonly known as:  NASACORT INSTILL 1 SPRAY IN EACH NOSTRIL ONCE OR TWICE DAILY AS NEEDED.   valACYclovir 1000 MG tablet Commonly known as:  VALTREX Take 1 tablet (1,000 mg total) by mouth 3 (three) times daily.   VITAMIN C PO Take 1 tablet by mouth every morning.            Discharge Care Instructions         Start     Ordered   01/04/17 0000  valACYclovir (VALTREX) 1000 MG tablet  3 times daily     01/04/17 1647       Follow-up: Return in about 2 weeks (around 01/18/2017), or if symptoms worsen or fail to improve.  Claretta Fraise, M.D.

## 2017-01-11 ENCOUNTER — Ambulatory Visit (INDEPENDENT_AMBULATORY_CARE_PROVIDER_SITE_OTHER): Payer: Medicare Other | Admitting: Family Medicine

## 2017-01-11 ENCOUNTER — Encounter: Payer: Self-pay | Admitting: Family Medicine

## 2017-01-11 VITALS — BP 124/63 | HR 86 | Temp 97.8°F | Ht 75.0 in | Wt 169.0 lb

## 2017-01-11 DIAGNOSIS — B029 Zoster without complications: Secondary | ICD-10-CM

## 2017-01-11 MED ORDER — GABAPENTIN 100 MG PO CAPS: 100.0000 mg | ORAL_CAPSULE | Freq: Three times a day (TID) | ORAL | 3 refills | Status: DC

## 2017-01-11 NOTE — Progress Notes (Signed)
BP (!) 162/84   Pulse 86   Temp 97.8 F (36.6 C) (Oral)   Ht 6\' 3"  (1.905 m)   Wt 169 lb (76.7 kg)   BMI 21.12 kg/m    Subjective:    Patient ID: Greg Robertson, male    DOB: [birth year], 66 y.o.   MRN: [id number]  HPI: Greg Robertson is a 66 y.o. male presenting on 01/11/2017 for Herpes Zoster (1 week follow up; having a lot of pain, did not mention it last week because he thought he had a crick in neck)   HPI Recheck for shingles Patient is coming in today for recheck for his shingles. He says that the rashes started to improve but the pain has really picked up significantly over the past couple days. He finished a course of Valtrex that he was taken and has not been taking anything for the pain yet but he says it's quite sharp and severe pain that is going around with a shingles are in his upper shoulder and up into his neck and even fill some of his ear in the back of his mouth as well. He denies any fevers or chills or drainage. He denies any redness or warmth.  Relevant past medical, surgical, family and social history reviewed and updated as indicated. Interim medical history since our last visit reviewed. Allergies and medications reviewed and updated.  Review of Systems  Constitutional: Negative for chills and fever.  Respiratory: Negative for shortness of breath and wheezing.   Cardiovascular: Negative for chest pain and leg swelling.  Musculoskeletal: Negative for back pain and gait problem.  Skin: Positive for rash.  All other systems reviewed and are negative.   Per HPI unless specifically indicated above        Objective:    BP (!) 162/84   Pulse 86   Temp 97.8 F (36.6 C) (Oral)   Ht 6\' 3"  (1.905 m)   Wt 169 lb (76.7 kg)   BMI 21.12 kg/m   Wt Readings from Last 3 Encounters:  01/11/17 169 lb (76.7 kg)  01/04/17 168 lb (76.2 kg)  05/27/16 163 lb (73.9 kg)    Physical Exam  Constitutional: He is oriented to person, place, and time. He  appears well-developed and well-nourished. No distress.  HENT:  Right Ear: External ear normal.  Left Ear: External ear normal.  Mouth/Throat: Oropharynx is clear and moist.  Eyes: Conjunctivae are normal. No scleral icterus.  Musculoskeletal: Normal range of motion. He exhibits no edema.  Neurological: He is alert and oriented to person, place, and time. Coordination normal.  Skin: Skin is warm and dry. Rash (Significant pain associated with the rash site, consistent with shingles) noted. He is not diaphoretic.     Psychiatric: He has a normal mood and affect. His behavior is normal.  Nursing note and vitals reviewed.       Assessment & Plan:   Problem List Items Addressed This Visit    None    Visit Diagnoses    Herpes zoster without complication    -  Primary   Relevant Medications   gabapentin (NEURONTIN) 100 MG capsule      Tylenol and ibuprofen and gabapentin for the pain Follow up plan: Return if symptoms worsen or fail to improve.  Counseling provided for all of the vaccine components No orders of the defined types were placed in this encounter.   Caryl Pina, MD [facility] 01/11/2017, 11:34 AM

## 2017-04-03 ENCOUNTER — Other Ambulatory Visit: Payer: Self-pay | Admitting: Family Medicine

## 2017-08-10 ENCOUNTER — Other Ambulatory Visit: Payer: Self-pay | Admitting: Family Medicine

## 2017-08-11 ENCOUNTER — Other Ambulatory Visit: Payer: Self-pay | Admitting: Family Medicine

## 2017-08-11 NOTE — Telephone Encounter (Signed)
Last seen 01/11/17  [facility] PCP

## 2017-08-11 NOTE — Telephone Encounter (Signed)
Last seen 01/11/17  [facility]

## 2017-09-01 ENCOUNTER — Encounter: Payer: Self-pay | Admitting: Gastroenterology

## 2017-09-04 ENCOUNTER — Ambulatory Visit (INDEPENDENT_AMBULATORY_CARE_PROVIDER_SITE_OTHER): Payer: Medicare Other | Admitting: Family Medicine

## 2017-09-04 ENCOUNTER — Encounter: Payer: Self-pay | Admitting: Family Medicine

## 2017-09-04 ENCOUNTER — Ambulatory Visit (INDEPENDENT_AMBULATORY_CARE_PROVIDER_SITE_OTHER): Payer: Medicare Other

## 2017-09-04 VITALS — BP 162/82 | HR 79 | Temp 97.5°F | Ht 75.0 in | Wt 172.0 lb

## 2017-09-04 DIAGNOSIS — E1065 Type 1 diabetes mellitus with hyperglycemia: Secondary | ICD-10-CM

## 2017-09-04 DIAGNOSIS — Z1509 Genetic susceptibility to other malignant neoplasm: Secondary | ICD-10-CM | POA: Diagnosis not present

## 2017-09-04 DIAGNOSIS — Z1211 Encounter for screening for malignant neoplasm of colon: Secondary | ICD-10-CM | POA: Diagnosis not present

## 2017-09-04 DIAGNOSIS — I10 Essential (primary) hypertension: Secondary | ICD-10-CM | POA: Diagnosis not present

## 2017-09-04 DIAGNOSIS — Z8679 Personal history of other diseases of the circulatory system: Secondary | ICD-10-CM

## 2017-09-04 DIAGNOSIS — C182 Malignant neoplasm of ascending colon: Secondary | ICD-10-CM

## 2017-09-04 DIAGNOSIS — N400 Enlarged prostate without lower urinary tract symptoms: Secondary | ICD-10-CM | POA: Diagnosis not present

## 2017-09-04 DIAGNOSIS — E1069 Type 1 diabetes mellitus with other specified complication: Secondary | ICD-10-CM

## 2017-09-04 DIAGNOSIS — I700 Atherosclerosis of aorta: Secondary | ICD-10-CM | POA: Insufficient documentation

## 2017-09-04 DIAGNOSIS — E785 Hyperlipidemia, unspecified: Secondary | ICD-10-CM

## 2017-09-04 DIAGNOSIS — E559 Vitamin D deficiency, unspecified: Secondary | ICD-10-CM

## 2017-09-04 DIAGNOSIS — E7800 Pure hypercholesterolemia, unspecified: Secondary | ICD-10-CM

## 2017-09-04 LAB — MICROSCOPIC EXAMINATION

## 2017-09-04 LAB — URINALYSIS, COMPLETE
Bilirubin, UA: NEGATIVE
LEUKOCYTES UA: NEGATIVE
Nitrite, UA: NEGATIVE
PH UA: 6.5 (ref 5.0–7.5)
PROTEIN UA: NEGATIVE
RBC, UA: NEGATIVE
Specific Gravity, UA: 1.02 (ref 1.005–1.030)
Urobilinogen, Ur: 0.2 mg/dL (ref 0.2–1.0)

## 2017-09-04 IMAGING — DX DG CHEST 2V
2 series · 2 of 2 positions shown · non-contrast
Comparison: None

CLINICAL DATA: Hypertension, type I diabetes mellitus,
hyperlipidemia, colon cancer

EXAM:
CHEST - 2 VIEW

[chest pa]
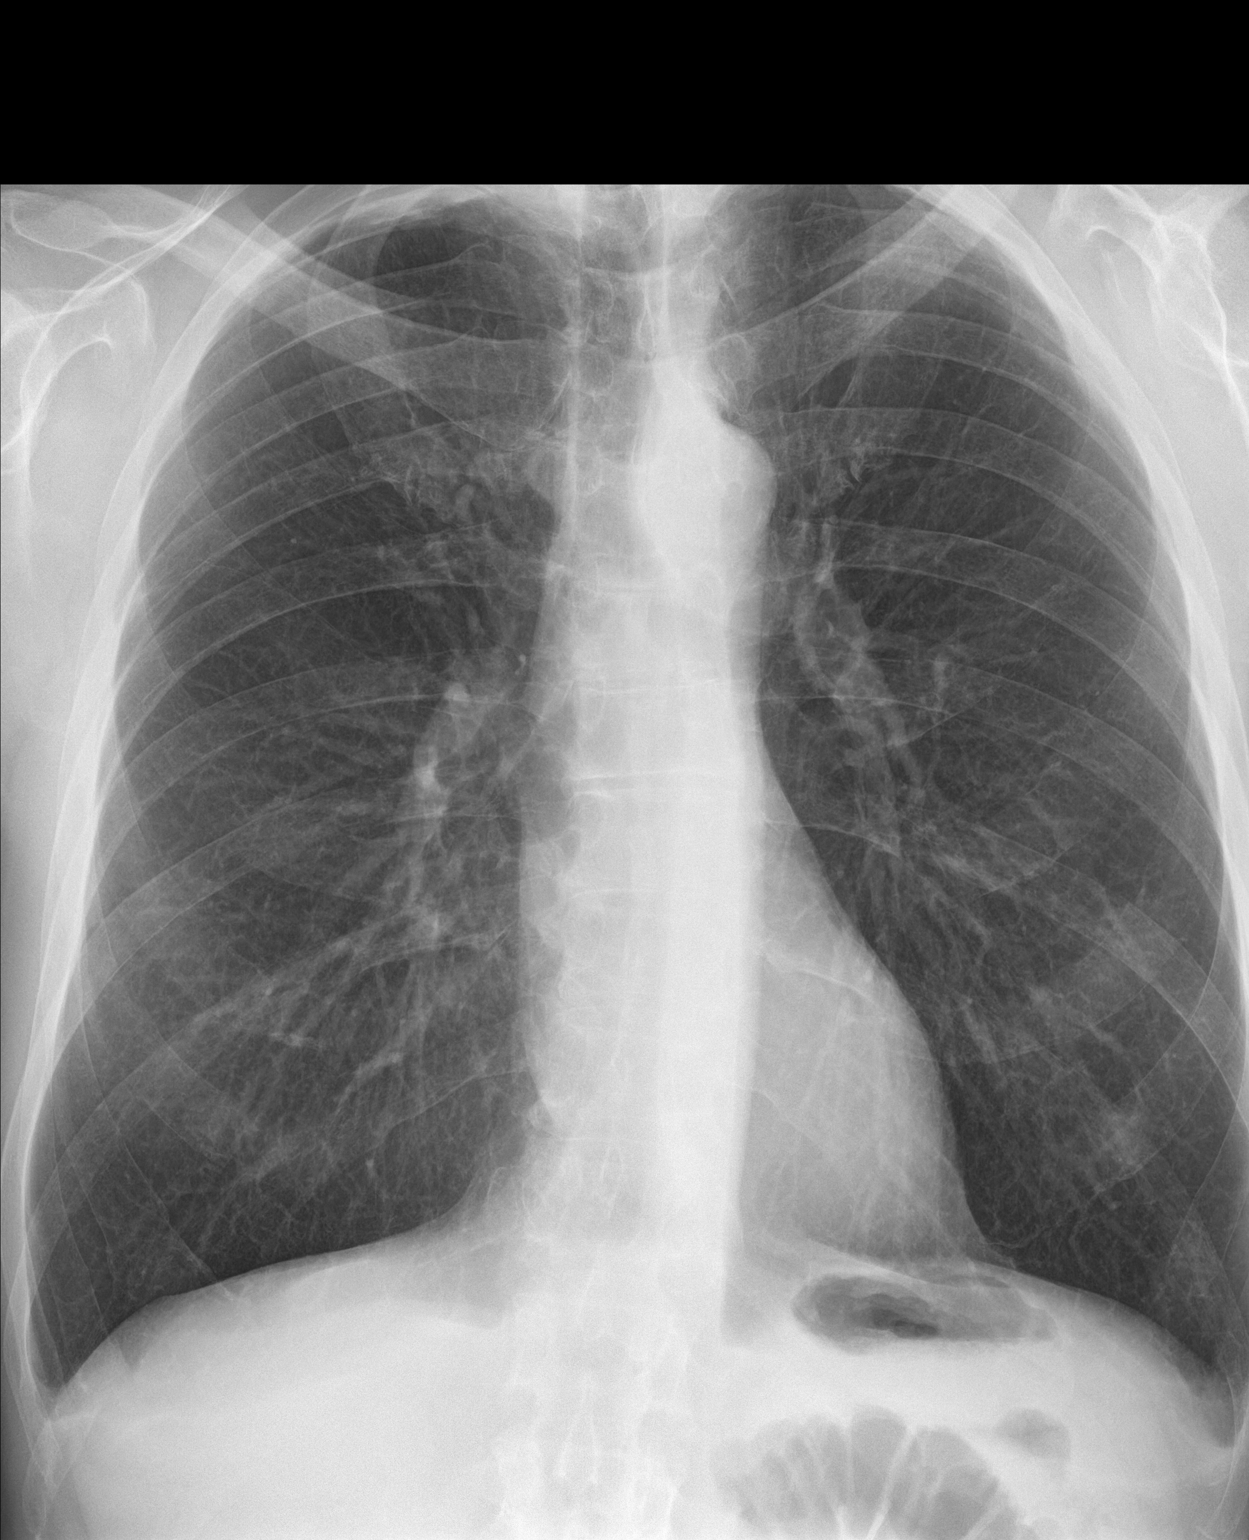

[chest lat]
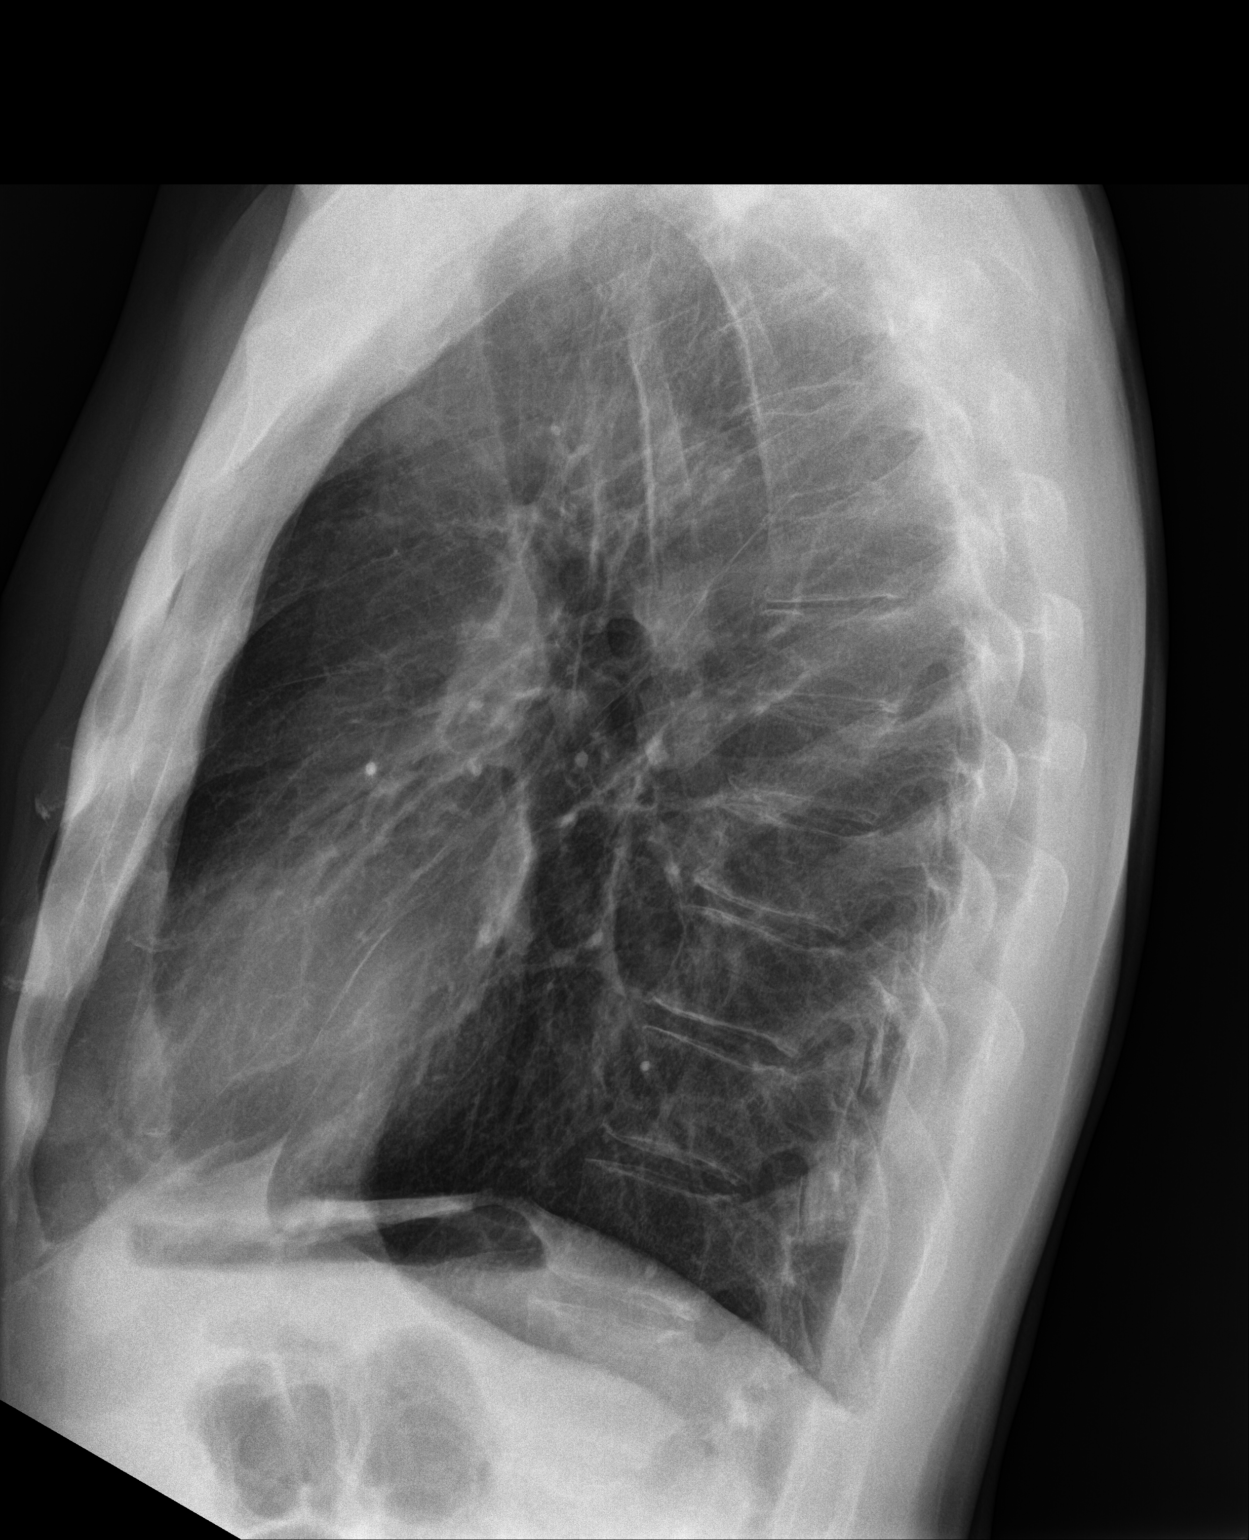

[2 of 2 positions shown; findings below may reference images not displayed]

FINDINGS: Normal heart size, mediastinal contours, and pulmonary vascularity.

Lungs emphysematous with mild biapical scarring.

No pulmonary infiltrate, pleural effusion or pneumothorax.

Lower lateral LEFT costal margin excluded.

No acute osseous findings.
IMPRESSION: COPD changes without infiltrate.

## 2017-09-04 MED ORDER — TRIAMCINOLONE ACETONIDE 55 MCG/ACT NA AERO: INHALATION_SPRAY | NASAL | 11 refills | Status: AC

## 2017-09-04 MED ORDER — SIMVASTATIN 40 MG PO TABS: 40.0000 mg | ORAL_TABLET | Freq: Every day | ORAL | 3 refills | Status: DC

## 2017-09-04 MED ORDER — LISINOPRIL 2.5 MG PO TABS: 2.5000 mg | ORAL_TABLET | Freq: Every day | ORAL | 3 refills | Status: DC

## 2017-09-04 MED ORDER — INSULIN ASPART 100 UNIT/ML ~~LOC~~ SOLN: SUBCUTANEOUS | 6 refills | Status: DC

## 2017-09-04 MED ORDER — LEVOCETIRIZINE DIHYDROCHLORIDE 5 MG PO TABS: 5.0000 mg | ORAL_TABLET | Freq: Every day | ORAL | 11 refills | Status: DC

## 2017-09-04 MED ORDER — INSULIN DEGLUDEC 100 UNIT/ML ~~LOC~~ SOPN: 16.0000 [IU] | PEN_INJECTOR | SUBCUTANEOUS | 6 refills | Status: DC

## 2017-09-04 NOTE — Patient Instructions (Addendum)
Medicare Annual Wellness Visit  [facility] and the medical providers at [facility] strive to bring you the best medical care.  In doing so we not only want to address your current medical conditions and concerns but also to detect new conditions early and prevent illness, disease and health-related problems.    Medicare offers a yearly Wellness Visit which allows our clinical staff to assess your need for preventative services including immunizations, lifestyle education, counseling to decrease risk of preventable diseases and screening for fall risk and other medical concerns.    This visit is provided free of charge (no copay) for all Medicare recipients. The clinical pharmacists at [facility] have begun to conduct these Wellness Visits which will also include a thorough review of all your medications.    As you primary medical provider recommend that you make an appointment for your Annual Wellness Visit if you have not done so already this year.  You may set up this appointment before you leave today or you may call back ([phone]) and schedule an appointment.  Please make sure when you call that you mention that you are scheduling your Annual Wellness Visit with the clinical pharmacist so that the appointment may be made for the proper length of time.     Continue current medications. Continue good therapeutic lifestyle changes which include good diet and exercise. Fall precautions discussed with patient. If an FOBT was given today- please return it to our front desk. If you are over 34 years old - you may need Prevnar 34 or the adult Pneumonia vaccine.  **Flu shots are available--- please call and schedule a FLU-CLINIC appointment**  After your visit with [place] today you will receive a survey in the mail or online from [employer] regarding your care with [place]. Please take a moment to fill this out. Your feedback is very  important to [place] as you can help [place] better understand your patient needs as well as improve your experience and satisfaction. WE CARE ABOUT YOU!!!    Call Dr Benay Spice - oncology and schedule follow up Call Dr Katy Fitch and schedule eye exam We will arrange a colonoscopy and call you with a appt. The patient should also schedule himself for a visit with a podiatrist to follow-up on his toenails for trimming. He should continue to keep a moisturizer on his feet and watch for any sign of redness or infection He should continue to walk regularly and stay well-hydrated

## 2017-09-04 NOTE — Progress Notes (Addendum)
Subjective:    Patient ID: Greg Robertson, male    DOB: 09-Sep-1950, 67 y.o.   MRN: [id number]  HPI Pt here for follow up and management of chronic medical problems which includes diabetes, hypertension and hyperlipidemia. He is taking medication regularly.  A recent CT scan of the chest and abdomen and pelvis was reviewed with the patient during the visit today.  Other than atherosclerosis being noted no other findings were present on these exams.  Patient is doing well overall.  He has had no GI symptoms blood in the stool or black tarry bowel movements.  He appears to be out of sync with following up with the oncologist and the gastroenterologist and we will make sure that happens from our end.  He may be in need for another colonoscopy and he certainly needs a visit with his oncologist.  The patient says he will schedule an eye visit he normally sees Dr. Katy Fitch.  He denies any chest pain pressure tightness or shortness of breath.  He denies any problems with his stomach or change in bowel habits or blood in the stool.  He says he is passing his water without problems.     Patient Active Problem List   Diagnosis Date Noted  . Thrombocytopenia ([place]) 01/22/2015  . Hyperlipidemia due to type 1 diabetes mellitus ([place]) 12/12/2013  . Nausea and vomiting 04/06/2013  . Diabetes 1.5, managed as type 1 ([place]) 10/29/2012  . HTN (hypertension) 03/14/2012  . Chronic diastolic heart failure, NYHA class 1 ([place]) 03/12/2012  . Demand ischemia ([place]) 03/12/2012  . Fracture of left clavicle-distal 03/12/2012  . NSTEMI (non-ST elevated myocardial infarction) ([place]) 03/11/2012  . Atrial fibrillation with RVR ([place]) 03/11/2012  . Diabetic ketoacidosis ([place]) 03/10/2012  . Dehydration, severe 03/10/2012  . Right bundle branch block 03/10/2012  . Atrial fibrillation by electrocardiogram ([place]) 03/10/2012  . Elevated troponin I level 03/10/2012  . Lynch syndrome 07/28/2011  . 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 Nonspecific abnormal finding in stool contents 05/10/2011  . Diabetes mellitus type 2, uncontrolled, without complications ([place]) 68/06/2120  . Glaucoma 05/10/2011   Outpatient Encounter Medications as of 09/04/2017  Medication Sig  . acetaminophen (TYLENOL) 500 MG tablet Take 1,000 mg by mouth every 6 (six) hours as needed. Pain   . Ascorbic Acid (VITAMIN C PO) Take 1 tablet by mouth every morning.   Marland Kitchen aspirin EC 81 MG tablet Take 81 mg by mouth daily.  Marland Kitchen glucose blood test strip Use as instructed  . latanoprost (XALATAN) 0.005 % ophthalmic solution Place 1 drop into both eyes at bedtime.  Marland Kitchen levocetirizine (XYZAL) 5 MG tablet TAKE ONE TABLET BY MOUTH ONE TIME DAILY  . lisinopril (PRINIVIL,ZESTRIL) 2.5 MG tablet TAKE 1 TABLET EVERY DAY  . Multiple Vitamin (MULTIVITAMIN) tablet Take 1 tablet by mouth every morning.   Marland Kitchen NOVOLOG 100 UNIT/ML injection INJECT 8-15 UNITS THREE TIMES DAILY BEFORE MEALS  . simvastatin (ZOCOR) 40 MG tablet Take 1 tablet (40 mg total) by mouth at bedtime.  . TRESIBA FLEXTOUCH 100 UNIT/ML SOPN FlexTouch Pen INJECT 0.16-0.2 MLS (16-20 UNITS TOTAL) INTO THE SKIN EVERY MORNING.  Marland Kitchen triamcinolone (NASACORT) 55 MCG/ACT AERO nasal inhaler INSTILL 1 SPRAY IN EACH NOSTRIL ONCE OR TWICE DAILY AS NEEDED.  . [DISCONTINUED] gabapentin (NEURONTIN) 100 MG capsule Take 1 capsule (100 mg total) by mouth 3 (three) times daily.  . [DISCONTINUED] NOVOLOG 100  UNIT/ML injection INJECT 8-15 UNITS THREE TIMES DAILY BEFORE MEALS  . [DISCONTINUED] valACYclovir (VALTREX) 1000 MG tablet Take 1 tablet (1,000 mg total) by mouth 3 (three) times daily.   No facility-administered encounter medications on file as of 09/04/2017.      Review of Systems  Constitutional: Negative.   HENT: Negative.   Eyes: Negative.   Respiratory: Negative.   Cardiovascular: Negative.   Gastrointestinal: Negative.    Endocrine: Negative.   Genitourinary: Negative.   Musculoskeletal: Negative.   Skin: Negative.        Feet very dry  / some itching   Allergic/Immunologic: Negative.   Neurological: Negative.   Hematological: Negative.   Psychiatric/Behavioral: Negative.        Objective:   Physical Exam  Constitutional: He is oriented to person, place, and time. He appears well-developed and well-nourished. No distress.  The patient is pleasant and alert and seems to be monitoring and keeping good control of his insulin-dependent diabetes.  HENT:  Head: Normocephalic and atraumatic.  Right Ear: External ear normal.  Left Ear: External ear normal.  Mouth/Throat: Oropharynx is clear and moist. No oropharyngeal exudate.  Nasal turbinate congestion bilaterally  Eyes: Pupils are equal, round, and reactive to light. Conjunctivae and EOM are normal. Right eye exhibits no discharge. Left eye exhibits no discharge.  Patient is in need of an eye exam and he says he will schedule this with Dr. Katy Fitch  Neck: Normal range of motion. Neck supple. No thyromegaly present.  No thyromegaly anterior cervical adenopathy or bruits  Cardiovascular: Normal rate, regular rhythm, normal heart sounds and intact distal pulses.  No murmur heard. Heart is regular at 72/min  Pulmonary/Chest: Effort normal and breath sounds normal. He has no wheezes. He has no rales. He exhibits no tenderness.  Clear anteriorly and posteriorly no axillary adenopathy  Abdominal: Soft. Bowel sounds are normal. He exhibits no mass. There is no tenderness.  No liver or spleen enlargement no epigastric tenderness no masses no inguinal adenopathy with normal bowel sounds  Genitourinary: Rectum normal and penis normal.  Genitourinary Comments: The prostate gland is slightly enlarged without lumps or masses.  There were no rectal masses.  The external genitalia were within normal limits.  Musculoskeletal: Normal range of motion. He exhibits no edema.    Lymphadenopathy:    He has no cervical adenopathy.  Neurological: He is alert and oriented to person, place, and time. He has normal reflexes. No cranial nerve deficit.  Skin: Skin is warm and dry. No rash noted. No erythema. No pallor.  Elongated nails on toes of feet that are thickened and patient needs visit with the podiatrist  Psychiatric: He has a normal mood and affect. His behavior is normal. Judgment and thought content normal.  The patient has normal mood and affect and behavior.  Nursing note and vitals reviewed.  BP (!) 148/82 (BP Location: Left Arm)   Pulse 79   Temp (!) 97.5 F (36.4 C) (Oral)   Ht _0  (1.905 m)   Wt 172 lb (78 kg)   BMI 21.50 kg/m         Assessment & Plan:  1. Type 1 diabetes mellitus with hyperglycemia ([place]) -Patient seems to have fairly good blood sugar control based on his history but is currently not taking anything for cholesterol and we will insist that he start back on something once the lab work is returned - CBC with Differential/Platelet; Future - Bayer DCA Hb A1c Waived; Future - DG  Chest 2 View; Future  2. Hyperlipidemia due to type 1 diabetes mellitus ([place]) -Check labs and start statin drug once lab work is returned - CBC with Differential/Platelet; Future - Lipid panel; Future - DG Chest 2 View; Future  3. Essential hypertension -Systolic blood pressure is slightly elevated and this will be repeated before he leaves the office today. - CBC with Differential/Platelet; Future - BMP8+EGFR; Future - Hepatic function panel; Future - DG Chest 2 View; Future  4. Pure hypercholesterolemia -Resume statin therapy once lab work is returned - CBC with Differential/Platelet; Future - Lipid panel; Future  5. Vitamin D deficiency -Continue vitamin D replacement - CBC with Differential/Platelet; Future - VITAMIN D 25 Hydroxy (Vit-D Deficiency, Fractures); Future  6. Benign prostatic hyperplasia, unspecified whether lower urinary  tract symptoms present -No complaints with voiding and prostate exam was only slightly enlarged. - CBC with Differential/Platelet; Future - Urinalysis, Complete - PSA, total and free; Future  7. Lynch syndrome -Appointment for gastroenterology and oncology follow-up visits - Ambulatory referral to Gastroenterology - [facility]; Future  8. Screen for colon cancer - Ambulatory referral to Gastroenterology - [facility]; Future  9. Malignant neoplasm of ascending colon [facility]) -Follow-up with gastroenterology and oncology - [facility]; Future  10.  History of atrial fibrillation -Appointment with cardiology for follow-up in the future here in [place] because of strong history of diabetes. -Patient was told in the past that he did not need to see the cardiologist anymore but because he has a long history of diabetes I think it would be a good idea to do a routine follow-up visit with the cardiologist periodically.  11.  Aortic atherosclerosis -Once cholesterol numbers have been returned we will most likely re-initiate statin therapy which patient has discontinued.  He understands the importance of taking statin drugs with diabetes.  Meds ordered this encounter  Medications  . triamcinolone (NASACORT) 55 MCG/ACT AERO nasal inhaler    Sig: INSTILL 1 SPRAY IN EACH NOSTRIL ONCE OR TWICE DAILY AS NEEDED.    Dispense:  16.5 Bottle    Refill:  11  . insulin degludec (TRESIBA FLEXTOUCH) 100 UNIT/ML SOPN FlexTouch Pen    Sig: Inject 0.16-0.2 mLs (16-20 Units total) into the skin every morning.    Dispense:  15 pen    Refill:  6  . simvastatin (ZOCOR) 40 MG tablet    Sig: Take 1 tablet (40 mg total) by mouth at bedtime.    Dispense:  90 tablet    Refill:  3  . insulin aspart (NOVOLOG) 100 UNIT/ML injection    Sig: INJECT 8-15 UNITS THREE TIMES DAILY BEFORE MEALS    Dispense:  10 mL    Refill:  6    SCRIPT EXPIRED,NEED NEW ONE  . lisinopril (PRINIVIL,ZESTRIL) 2.5 MG tablet    Sig: Take 1 tablet (2.5 mg total)  by mouth daily.    Dispense:  90 tablet    Refill:  3  . levocetirizine (XYZAL) 5 MG tablet    Sig: Take 1 tablet (5 mg total) by mouth daily.    Dispense:  30 tablet    Refill:  11   Patient Instructions                       Medicare Annual Wellness Visit  [facility] and the medical providers at [facility] strive to bring you the best medical care.  In doing so we not only want to address your current medical conditions  and concerns but also to detect new conditions early and prevent illness, disease and health-related problems.    Medicare offers a yearly Wellness Visit which allows our clinical staff to assess your need for preventative services including immunizations, lifestyle education, counseling to decrease risk of preventable diseases and screening for fall risk and other medical concerns.    This visit is provided free of charge (no copay) for all Medicare recipients. The clinical pharmacists at [facility] have begun to conduct these Wellness Visits which will also include a thorough review of all your medications.    As you primary medical provider recommend that you make an appointment for your Annual Wellness Visit if you have not done so already this year.  You may set up this appointment before you leave today or you may call back ([phone]) and schedule an appointment.  Please make sure when you call that you mention that you are scheduling your Annual Wellness Visit with the clinical pharmacist so that the appointment may be made for the proper length of time.     Continue current medications. Continue good therapeutic lifestyle changes which include good diet and exercise. Fall precautions discussed with patient. If an FOBT was given today- please return it to our front desk. If you are over 53 years old - you may need Prevnar 30 or the adult Pneumonia vaccine.  **Flu shots are available--- please call and schedule a  FLU-CLINIC appointment**  After your visit with [place] today you will receive a survey in the mail or online from [employer] regarding your care with [place]. Please take a moment to fill this out. Your feedback is very important to [place] as you can help [place] better understand your patient needs as well as improve your experience and satisfaction. WE CARE ABOUT YOU!!!    Call Dr Benay Spice - oncology and schedule follow up Call Dr Katy Fitch and schedule eye exam We will arrange a colonoscopy and call you with a appt. The patient should also schedule himself for a visit with a podiatrist to follow-up on his toenails for trimming. He should continue to keep a moisturizer on his feet and watch for any sign of redness or infection He should continue to walk regularly and stay well-hydrated    Arrie Senate MD

## 2017-09-05 ENCOUNTER — Encounter: Payer: Self-pay | Admitting: *Deleted

## 2017-09-05 ENCOUNTER — Ambulatory Visit (INDEPENDENT_AMBULATORY_CARE_PROVIDER_SITE_OTHER): Payer: Medicare Other | Admitting: *Deleted

## 2017-09-05 ENCOUNTER — Encounter: Payer: Self-pay | Admitting: Family Medicine

## 2017-09-05 VITALS — BP 152/84 | HR 83 | Ht 73.5 in | Wt 171.0 lb

## 2017-09-05 DIAGNOSIS — Z0000 Encounter for general adult medical examination without abnormal findings: Secondary | ICD-10-CM

## 2017-09-05 DIAGNOSIS — J449 Chronic obstructive pulmonary disease, unspecified: Secondary | ICD-10-CM | POA: Insufficient documentation

## 2017-09-05 NOTE — Patient Instructions (Addendum)
  Mr. Stcyr , Thank you for taking time to come for your Medicare Wellness Visit. I appreciate your ongoing commitment to your health goals. Please review the following plan we discussed and let me know if I can assist you in the future.   These are the goals we discussed: Goals    . Exercise 150 min/wk Moderate Activity       This is a list of the screening recommended for you and due dates:  Health Maintenance  Topic Date Due  . Hemoglobin A1C  06/22/2016  . Eye exam for diabetics  08/18/2016  . Pneumonia vaccines (2 of 2 - PPSV23) 10/05/2017*  .  Hepatitis C: One time screening is recommended by Center for Disease Control  (CDC) for  adults born from [birth year] through 1965.   09/05/2018*  . Colon Cancer Screening  09/09/2017  . Flu Shot  11/16/2017  . Complete foot exam   09/05/2018  . Tetanus Vaccine  07/22/2025  *Topic was postponed. The date shown is not the original due date.    Consider Freestyle Libre to monitor blood sugar

## 2017-09-05 NOTE — Progress Notes (Addendum)
Subjective:   Greg Robertson is a 67 y.o. male who presents for an Initial Medicare Annual Wellness Visit. Greg Robertson is retired and lives at home alone. He has never been married and does not have any children. He was diagnosed with type 1 diabetes at the age of 75.  Review of Systems  Health is about the same as last year.   Cardiac Risk Factors include: advanced age (>57men, >62 women);dyslipidemia;hypertension;male gender  Endocrinology: Type 1 diabetes since age 45. Describes himself as a "brittle diabetic". Says that his blood sugar fluctuates wildly but reports that he checks is usually 2 times a day and up to 4 times a day.     Psych: chronic insomnia. Has been evaluated and prescribed many treatments. None of which worked.   Objective:    Today's Vitals   09/05/17 1523  BP: (!) 152/84  Pulse: 83  Weight: 171 lb (77.6 kg)  Height: 6' 1.5" (1.867 m)   Body mass index is 22.25 kg/m.  Advanced Directives 09/05/2017 07/09/2015 01/09/2015 08/27/2014 04/06/2013 03/11/2012 07/08/2011  Does Patient Have a Medical Advance Directive? Yes No No Yes Patient has advance directive, copy in chart Patient does not have advance directive Patient does not have advance directive  Type of Advance Directive [place];Living will - - [place];Living will Living will - -  Does patient want to make changes to medical advance directive? No - Patient declined - - No - Patient declined No - -  Copy of [place] in Chart? No - copy requested - - No - copy requested - - -  Would patient like information on creating a medical advance directive? - No - patient declined information No - patient declined information - - - -  Pre-existing out of facility DNR order (yellow form or pink MOST form) - - - - - No -    Current Medications (verified) Outpatient Encounter Medications as of 09/05/2017  Medication Sig  . acetaminophen (TYLENOL) 500 MG  tablet Take 1,000 mg by mouth every 6 (six) hours as needed. Pain   . Ascorbic Acid (VITAMIN C PO) Take 1 tablet by mouth every morning.   Marland Kitchen aspirin EC 81 MG tablet Take 81 mg by mouth daily.  Marland Kitchen glucose blood test strip Use as instructed  . insulin aspart (NOVOLOG) 100 UNIT/ML injection INJECT 8-15 UNITS THREE TIMES DAILY BEFORE MEALS  . insulin degludec (TRESIBA FLEXTOUCH) 100 UNIT/ML SOPN FlexTouch Pen Inject 0.16-0.2 mLs (16-20 Units total) into the skin every morning.  . latanoprost (XALATAN) 0.005 % ophthalmic solution Place 1 drop into both eyes at bedtime.  Marland Kitchen levocetirizine (XYZAL) 5 MG tablet Take 1 tablet (5 mg total) by mouth daily.  Marland Kitchen lisinopril (PRINIVIL,ZESTRIL) 2.5 MG tablet Take 1 tablet (2.5 mg total) by mouth daily.  . Multiple Vitamin (MULTIVITAMIN) tablet Take 1 tablet by mouth every morning.   . simvastatin (ZOCOR) 40 MG tablet Take 1 tablet (40 mg total) by mouth at bedtime.  . triamcinolone (NASACORT) 55 MCG/ACT AERO nasal inhaler INSTILL 1 SPRAY IN EACH NOSTRIL ONCE OR TWICE DAILY AS NEEDED.   No facility-administered encounter medications on file as of 09/05/2017.     Allergies (verified) Oxycodone   History: Past Medical History:  Diagnosis Date  . Allergic rhinitis   . Anemia   . Cancer ([place])   . Cancer of ascending colon, 7cm 05/25/2011  . Colonic mass   . Diabetes mellitus   .  Diabetes mellitus type I ([place])   . Glaucoma   . Hypertension   . Pneumonia 1979 or 1980  . Prostate nodule   . Substance abuse [facility])    Past Surgical History:  Procedure Laterality Date  . broken left shoulder blade and collar bone    . FINGER SURGERY  2009   right middle  . LAPAROSCOPIC ASSISTED 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    . NASAL FRACTURE SURGERY  1968  . PORTACATH PLACEMENT  07/01/2011   Procedure: INSERTION PORT-A-CATH;  Surgeon: Adin Hector, MD;  Location: [facility];  Service: General;  Laterality: Left;  Insertion of Port-A-Catheter Left Internal  Jugular  . TONSILLECTOMY  1957 - approximate   Family History  Problem Relation Age of Onset  . Colon polyps Father   . Lung cancer Father   . Diabetes Father   . Prostate cancer Father   . Breast cancer Mother   . Pancreatitis Mother        intestinal adhesions  . Insomnia Mother   . Colon cancer Neg Hx    Social History   Socioeconomic History  . Marital status: Single    Spouse name: Not on file  . Number of children: 0  . Years of education: 16  . Highest education level: Bachelor's degree (e.g., BA, AB, BS)  Occupational History  . Occupation: Retired    Comment: Geographical information systems officer  . Financial resource strain: Not hard at all  . Food insecurity:    Worry: Never true    Inability: Never true  . Transportation needs:    Medical: No    Non-medical: No  Tobacco Use  . Smoking status: Former Smoker    Packs/day: 1.00    Years: 15.00    Pack years: 15.00    Types: Cigarettes    Last attempt to quit: 04/18/1993    Years since quitting: 24.4  . Smokeless tobacco: Never Used  . Tobacco comment: marijuana every night   Substance and Sexual Activity  . Alcohol use: Yes    Alcohol/week: 3.0 oz    Types: 3 Cans of beer, 2 Shots of liquor per week    Comment: 1 drink every 2 days  . Drug use: Yes    Types: Marijuana    Comment: once a night marijuana  . Sexual activity: Not Currently  Lifestyle  . Physical activity:    Days per week: 5 days    Minutes per session: 40 min  . Stress: Only a little  Relationships  . Social connections:    Talks on phone: More than three times a week    Gets together: More than three times a week    Attends religious service: Never    Active member of club or organization: No    Attends meetings of clubs or organizations: Never    Relationship status: Never married  Other Topics Concern  . Not on file  Social History Narrative  . Not on file   Tobacco Counseling Counseling given: Not Answered Comment: marijuana every night      Clinical Intake:    Pain : No/denies pain    Nutritional Status: BMI of 19-24  Normal Diabetes: No  How often do you need to have someone help you when you read instructions, pamphlets, or other written materials from your doctor or pharmacy?: 1 - Never What is the last grade level you completed in school?: 16-college graduate  Interpreter Needed?: No  Information entered by ::  Chong Sicilian, RN  Activities of Daily Living In your present state of health, do you have any difficulty performing the following activities: 09/05/2017  Hearing? N  Vision? N  Comment yearly eye exams. 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 N  Walking or climbing stairs? N  Dressing or bathing? N  Doing errands, shopping? N  Preparing Food and eating ? N  Using the Toilet? N  In the past six months, have you accidently leaked urine? N  Do you have problems with loss of bowel control? N  Managing your Medications? N  Managing your Finances? N  Housekeeping or managing your Housekeeping? N  Some recent data might be hidden     Immunizations and Health Maintenance Immunization History  Administered Date(s) Administered  . Influenza Split 06/04/2011  . Influenza,inj,Quad PF,6+ Mos 04/08/2013, 03/06/2014, 01/21/2015, 02/03/2016  . Pneumococcal Conjugate-13 01/21/2015  . Pneumococcal Polysaccharide-23 06/04/2011  . Tdap 07/23/2015   Health Maintenance Due  Topic Date Due  . HEMOGLOBIN A1C  06/22/2016  . OPHTHALMOLOGY EXAM  08/18/2016    Patient Care Team: Chipper Herb, MD as PCP - General (Family Medicine) Inda Castle, MD (Inactive) as Consulting Physician (Gastroenterology)  No hospitalizations, ER visits, or surgeries this past year.      Assessment:   This is a routine wellness examination for Greg Robertson.  Hearing/Vision screen No deficits noted during visit. States that he has  limited vision in right eye. Eye exam is overdue.  Dietary issues and exercise activities discussed: Current Exercise Habits: Home exercise routine, Type of exercise: walking, Time (Minutes): 45, Frequency (Times/Week): 4, Weekly Exercise (Minutes/Week): 180, Intensity: Moderate, Exercise limited by: None identified  Goals    . Exercise 150 min/wk Moderate Activity      Depression Screen PHQ 2/9 Scores 09/04/2017 01/11/2017 01/04/2017 05/27/2016  PHQ - 2 Score 0 0 0 0    Fall Risk Fall Risk  09/04/2017 01/04/2017 05/27/2016 11/25/2015 07/23/2015  Falls in the past year? No No No No No    Is the patient's home free of loose throw rugs in walkways, pet beds, electrical cords, etc?   yes      Grab bars in the bathroom? no      Handrails on the stairs?   yes      Adequate lighting?   yes   Cognitive Function: MMSE - Mini Mental State Exam 09/05/2017  Orientation to time 5  Orientation to Place 5  Registration 3  Attention/ Calculation 5  Recall 3  Language- name 2 objects 2  Language- repeat 1  Language- follow 3 step command 3  Language- read & follow direction 1  Write a sentence 1  Copy design 1  Total score 30        Screening Tests Health Maintenance  Topic Date Due  . HEMOGLOBIN A1C  06/22/2016  . OPHTHALMOLOGY EXAM  08/18/2016  . PNA vac Low Risk Adult (2 of 2 - PPSV23) 10/05/2017 (Originally 06/03/2016)  . Hepatitis C Screening  09/05/2018 (Originally 1950-11-16)  . COLONOSCOPY  09/09/2017  . INFLUENZA VACCINE  11/16/2017  . FOOT EXAM  09/05/2018  . TETANUS/TDAP  07/22/2025    Cancer Screenings: Lung: Low Dose CT Chest recommended if Age 25-80 years, 30 pack-year currently smoking OR have quit w/in 15years. Patient does not qualify. Colorectal: up to date     Plan:   Keep f/u with PCP  Return for fasting labs that have been ordered Consider Freestyle Libre to monitor blood sugar. Handout given and discussed.  Continue to stay active. Aim for at least 150 minutes of  moderate activity a week.  Schedule eye exam  I have personally reviewed and noted the following in the patient's chart:   . Medical and social history . Use of alcohol, tobacco or illicit drugs  . Current medications and supplements . Functional ability and status . Nutritional status . Physical activity . Advanced directives . List of other physicians . Hospitalizations, surgeries, and ER visits in previous 12 months . Vitals . Screenings to include cognitive, depression, and falls . Referrals and appointments  In addition, I have reviewed and discussed with patient certain preventive protocols, quality metrics, and best practice recommendations. A written personalized care plan for preventive services as well as general preventive health recommendations were provided to patient.     Chong Sicilian, RN   09/05/2017  I have reviewed and agree with the above AWV documentation.   Arrie Senate MD

## 2017-09-13 ENCOUNTER — Other Ambulatory Visit: Payer: Medicare Other

## 2017-09-13 DIAGNOSIS — E785 Hyperlipidemia, unspecified: Secondary | ICD-10-CM | POA: Diagnosis not present

## 2017-09-13 DIAGNOSIS — Z1507 Genetic susceptibility to malignant neoplasm of urinary tract: Secondary | ICD-10-CM

## 2017-09-13 DIAGNOSIS — C182 Malignant neoplasm of ascending colon: Secondary | ICD-10-CM | POA: Diagnosis not present

## 2017-09-13 DIAGNOSIS — E7800 Pure hypercholesterolemia, unspecified: Secondary | ICD-10-CM | POA: Diagnosis not present

## 2017-09-13 DIAGNOSIS — I10 Essential (primary) hypertension: Secondary | ICD-10-CM | POA: Diagnosis not present

## 2017-09-13 DIAGNOSIS — Z1211 Encounter for screening for malignant neoplasm of colon: Secondary | ICD-10-CM | POA: Diagnosis not present

## 2017-09-13 DIAGNOSIS — E1065 Type 1 diabetes mellitus with hyperglycemia: Secondary | ICD-10-CM

## 2017-09-13 DIAGNOSIS — N400 Enlarged prostate without lower urinary tract symptoms: Secondary | ICD-10-CM

## 2017-09-13 DIAGNOSIS — Z1509 Genetic susceptibility to other malignant neoplasm: Secondary | ICD-10-CM

## 2017-09-13 DIAGNOSIS — E559 Vitamin D deficiency, unspecified: Secondary | ICD-10-CM

## 2017-09-13 DIAGNOSIS — E1069 Type 1 diabetes mellitus with other specified complication: Secondary | ICD-10-CM

## 2017-09-13 LAB — BAYER DCA HB A1C WAIVED: HB A1C: 9.2 % — AB (ref <7.0)

## 2017-09-14 LAB — BMP8+EGFR
BUN/Creatinine Ratio: 15 (ref 10–24)
BUN: 15 mg/dL (ref 8–27)
CALCIUM: 9.5 mg/dL (ref 8.6–10.2)
CO2: 26 mmol/L (ref 20–29)
Chloride: 102 mmol/L (ref 96–106)
Creatinine, Ser: 1.01 mg/dL (ref 0.76–1.27)
GFR calc Af Amer: 89 mL/min/{1.73_m2} (ref >59)
GFR, EST NON AFRICAN AMERICAN: 77 mL/min/{1.73_m2} (ref >59)
GLUCOSE: 127 mg/dL — AB (ref 65–99)
Potassium: 4.4 mmol/L (ref 3.5–5.2)
SODIUM: 143 mmol/L (ref 134–144)

## 2017-09-14 LAB — CBC WITH DIFFERENTIAL/PLATELET
Basophils Absolute: 0 10*3/uL (ref 0.0–0.2)
Basos: 0 %
EOS (ABSOLUTE): 0.1 10*3/uL (ref 0.0–0.4)
Eos: 2 %
Hematocrit: 43 % (ref 37.5–51.0)
Hemoglobin: 14.8 g/dL (ref 13.0–17.7)
IMMATURE GRANS (ABS): 0 10*3/uL (ref 0.0–0.1)
IMMATURE GRANULOCYTES: 0 %
LYMPHS: 36 %
Lymphocytes Absolute: 2.1 10*3/uL (ref 0.7–3.1)
MCH: 31.4 pg (ref 26.6–33.0)
MCHC: 34.4 g/dL (ref 31.5–35.7)
MCV: 91 fL (ref 79–97)
MONOS ABS: 0.7 10*3/uL (ref 0.1–0.9)
Monocytes: 12 %
NEUTROS PCT: 50 %
Neutrophils Absolute: 2.9 10*3/uL (ref 1.4–7.0)
PLATELETS: 149 10*3/uL — AB (ref 150–450)
RBC: 4.71 x10E6/uL (ref 4.14–5.80)
RDW: 13.9 % (ref 12.3–15.4)
WBC: 5.9 10*3/uL (ref 3.4–10.8)

## 2017-09-14 LAB — LIPID PANEL
CHOLESTEROL TOTAL: 218 mg/dL — AB (ref 100–199)
Chol/HDL Ratio: 2.4 ratio (ref 0.0–5.0)
HDL: 91 mg/dL (ref >39)
LDL CALC: 112 mg/dL — AB (ref 0–99)
TRIGLYCERIDES: 76 mg/dL (ref 0–149)
VLDL Cholesterol Cal: 15 mg/dL (ref 5–40)

## 2017-09-14 LAB — HEPATIC FUNCTION PANEL
ALBUMIN: 4.3 g/dL (ref 3.6–4.8)
ALT: 15 IU/L (ref 0–44)
AST: 16 IU/L (ref 0–40)
Alkaline Phosphatase: 77 IU/L (ref 39–117)
BILIRUBIN TOTAL: 0.7 mg/dL (ref 0.0–1.2)
Bilirubin, Direct: 0.16 mg/dL (ref 0.00–0.40)
TOTAL PROTEIN: 6.3 g/dL (ref 6.0–8.5)

## 2017-09-14 LAB — PSA, TOTAL AND FREE
PSA FREE PCT: 16.8 %
PSA FREE: 0.42 ng/mL
Prostate Specific Ag, Serum: 2.5 ng/mL (ref 0.0–4.0)

## 2017-09-14 LAB — VITAMIN D 25 HYDROXY (VIT D DEFICIENCY, FRACTURES): VIT D 25 HYDROXY: 14.7 ng/mL — AB (ref 30.0–100.0)

## 2017-09-14 LAB — CEA: CEA1: 6.5 ng/mL — AB (ref 0.0–4.7)

## 2017-09-15 ENCOUNTER — Other Ambulatory Visit: Payer: Medicare Other

## 2017-09-15 DIAGNOSIS — Z1211 Encounter for screening for malignant neoplasm of colon: Secondary | ICD-10-CM

## 2017-09-15 NOTE — Progress Notes (Signed)
Left message on voice mail to contact the office and ask for [facility] to schedule an office visit.

## 2017-09-19 LAB — FECAL OCCULT BLOOD, IMMUNOCHEMICAL: Fecal Occult Bld: NEGATIVE

## 2017-09-20 ENCOUNTER — Telehealth: Payer: Self-pay | Admitting: Oncology

## 2017-09-20 NOTE — Telephone Encounter (Signed)
Scheduled appt per 6/5 sch message - left message with appt date and time and sent reminder letter in the mail .

## 2017-09-22 ENCOUNTER — Encounter: Payer: Self-pay | Admitting: Gastroenterology

## 2017-09-22 ENCOUNTER — Telehealth: Payer: Self-pay | Admitting: Gastroenterology

## 2017-09-22 NOTE — Telephone Encounter (Signed)
I have tried to contact patient several times by phone and have left detailed voice messages with no response. A letter was mailed to his address today to please contact the office to schedule office visit.

## 2017-09-22 NOTE — Telephone Encounter (Signed)
-----   Message from Milus Banister, MD sent at 09/15/2017  8:02 AM EDT ----- We just sent him a letter earlier this month about coming in to discuss repeat colonoscopy. Will call him to make sure he follows up on that.  Claiborne Billings, Can you contact him. He needs an office visit to discuss his Lynch Syndrome to plan for colonoscopy and EGD (I don't think he's ever had EGD).

## 2017-10-12 ENCOUNTER — Inpatient Hospital Stay: Payer: Medicare Other | Attending: Oncology | Admitting: Oncology

## 2017-10-12 ENCOUNTER — Telehealth: Payer: Self-pay | Admitting: Oncology

## 2017-10-12 VITALS — BP 157/76 | HR 83 | Temp 98.2°F | Resp 18 | Ht 73.5 in | Wt 171.6 lb

## 2017-10-12 DIAGNOSIS — Z1509 Genetic susceptibility to other malignant neoplasm: Secondary | ICD-10-CM | POA: Insufficient documentation

## 2017-10-12 DIAGNOSIS — Z794 Long term (current) use of insulin: Secondary | ICD-10-CM | POA: Diagnosis not present

## 2017-10-12 DIAGNOSIS — E119 Type 2 diabetes mellitus without complications: Secondary | ICD-10-CM | POA: Insufficient documentation

## 2017-10-12 DIAGNOSIS — R970 Elevated carcinoembryonic antigen [CEA]: Secondary | ICD-10-CM | POA: Insufficient documentation

## 2017-10-12 DIAGNOSIS — C182 Malignant neoplasm of ascending colon: Secondary | ICD-10-CM | POA: Insufficient documentation

## 2017-10-12 NOTE — Progress Notes (Signed)
[place] OFFICE PROGRESS NOTE   Diagnosis: Colon cancer  INTERVAL HISTORY:   Greg Robertson was last seen at the Cancer center in September 2017.  He reports feeling well.  No difficulty with bowel function.  No bleeding. He reports "dryness "of the feet. He last underwent a colonoscopy in May 2016.  Objective:  Vital signs in last 24 hours:  Blood pressure (!) 157/76, pulse 83, temperature 98.2 F (36.8 C), temperature source Oral, resp. rate 18, height 6' 1.5" (1.867 m), weight 171 lb 9.6 oz (77.8 kg), SpO2 99 %.    HEENT: Neck without mass Lymphatics:?  Tiny right posterior cervical node, no other cervical, supraclavicular, axillary, or inguinal nodes Resp: Lungs clear bilaterally Cardio: Regular rate and rhythm GI: No hepatosplenomegaly, no mass, nontender Vascular: Trace edema at the lower leg and ankle bilaterally  Skin: Erythema with demarcation and superficial desquamation at the soles of the feet and toes    Lab Results:  Lab Results  Component Value Date   WBC 5.9 09/13/2017   HGB 14.8 09/13/2017   HCT 43.0 09/13/2017   MCV 91 09/13/2017   PLT 149 (L) 09/13/2017   NEUTROABS 2.9 09/13/2017    CMP  Lab Results  Component Value Date   NA 143 09/13/2017   K 4.4 09/13/2017   CL 102 09/13/2017   CO2 26 09/13/2017   GLUCOSE 127 (H) 09/13/2017   BUN 15 09/13/2017   CREATININE 1.01 09/13/2017   CALCIUM 9.5 09/13/2017   PROT 6.3 09/13/2017   ALBUMIN 4.3 09/13/2017   AST 16 09/13/2017   ALT 15 09/13/2017   ALKPHOS 77 09/13/2017   BILITOT 0.7 09/13/2017   GFRNONAA 77 09/13/2017   GFRAA 89 09/13/2017    Lab Results  Component Value Date   CEA1 6.5 (H) 09/13/2017     Medications: I have reviewed the patient's current medications.   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 Tumor microsatellite unstable, K-ras wild type. Adjuvant  FOLFOX chemotherapy initiated on 07/14/2011. Cycle 12 given on 01/05/2012.  -Negative surveillance colonoscopy 06/06/2012  -Negative surveillance CT scans 04/25/2013 -Negative surveillance CT scans 07/21/2014 -Negative surveillance colonoscopy 09/10/2014 2. Microcytic anemia. Likely secondary to iron deficiency. Resolved. 3. Insulin-dependent diabetes. 4. Glaucoma. 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 He has seen a Retail buyer. He has been contacted with updated information regarding screening recommendations. 6. Status post Port-A-Cath placement 07/01/2011. The Port-A-Cath has been removed. 7. History of thrombocytopenia secondary to chemotherapy. Oxaliplatin was held with cycle 3. Oxaliplatin was resumed with cycle 4 at a 25% dose reduction. Oxaliplatin was further dose reduced beginning with cycle 6 due to cytopenias. The oxaliplatin was held with cycle 8, cycle 10 and cycle 11. 8. History of neutropenia secondary to chemotherapy. 9. Chronic mild elevation of the CEA   Disposition: Greg Robertson remains in clinical remission from colon cancer.  The CEA is chronically elevated.  This is likely a benign normal variant.  He will return for a CEA in 2 months.  He will be scheduled for an office visit and CEA in 6 months.  We will consider additional evaluation for a persistent rise in the CEA.  Greg Robertson has hereditary non-polyposis colon cancer syndrome.  He has not undergone endoscopic evaluation in several years.  He  is scheduled to see Dr. Ardis Hughs next month.  He appears to have a yeast infection of the feet.  He will try an over-the-counter antifungal powder and follow-up with Dr. Laurance Flatten if this does not help.    Betsy Coder, MD  10/12/2017  10:13 AM

## 2017-10-12 NOTE — Telephone Encounter (Signed)
Appointments scheduled AVS/Calendar printed per 6/27 los °

## 2017-11-24 ENCOUNTER — Encounter: Payer: Self-pay | Admitting: Gastroenterology

## 2017-11-24 ENCOUNTER — Ambulatory Visit (INDEPENDENT_AMBULATORY_CARE_PROVIDER_SITE_OTHER): Payer: Medicare Other | Admitting: Gastroenterology

## 2017-11-24 ENCOUNTER — Encounter (INDEPENDENT_AMBULATORY_CARE_PROVIDER_SITE_OTHER): Payer: Self-pay

## 2017-11-24 VITALS — BP 158/74 | HR 84 | Ht 73.75 in | Wt 167.5 lb

## 2017-11-24 DIAGNOSIS — Z85038 Personal history of other malignant neoplasm of large intestine: Secondary | ICD-10-CM | POA: Diagnosis not present

## 2017-11-24 DIAGNOSIS — Z1509 Genetic susceptibility to other malignant neoplasm: Secondary | ICD-10-CM

## 2017-11-24 MED ORDER — PEG 3350-KCL-NA BICARB-NACL 420 G PO SOLR: 4000.0000 mL | ORAL | 0 refills | Status: DC

## 2017-11-24 NOTE — Patient Instructions (Addendum)
You will be set up for a colonoscopy and EGD. You have Lynch Syndrome and so will need a colonoscopy annually and an upper endoscopy every 2-3 years.  Please purchase the following medications over the counter and take as directed:

## 2017-11-24 NOTE — Progress Notes (Signed)
Review of pertinent gastrointestinal problems: 1.  T3N1Colon cancer diagnosed during colonoscopy 2012 Dr. Erskine Emery.  Cancer in the cecum.  He underwent a right colectomy February 2013, then adjuvant FOLFOX.  Tumor pathology confirmed microsatellite unstable.  Loss of expression of PMS 2.  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  He has Lynch syndrome.  Colonoscopy February 2014 showed no polyps.  Colonoscopy 08/2014 showed no polyps.   HPI: This is a very pleasant 67 year old man whom I am meeting for the first time today  Chief complaint is Lynch syndrome, personal history of colon cancer  Colon cancer diagnosed 2012 x 1 of my previous partners here at [facility].  He has had 2 colonoscopies since then without any polyps.  Turns out he has Lynch syndrome based on genetic testing and pathologic microsatellite instability.  He has no overt GI bleeding.  Tends to have 2 or 3 soft bowel movement today.  He is not troubled by this.  His weight has been overall stable.  He has never had upper endoscopy    Review of systems: Pertinent positive and negative review of systems were noted in the above HPI section. All other review negative.   Past Medical History:  Diagnosis Date  . Allergic rhinitis   . Anemia   . Cancer of ascending colon, 7cm 05/25/2011  . Colonic mass   . Diabetes mellitus type I ([place])   . Glaucoma   . Hypertension   . Pneumonia 1979 or 1980  . Prostate nodule   . Substance abuse [facility])     Past Surgical History:  Procedure Laterality Date  . broken left shoulder blade and collar bone    . FINGER SURGERY  2009   right middle  . LAPAROSCOPIC ASSISTED 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    . NASAL FRACTURE SURGERY  1968  . PORTACATH PLACEMENT  07/01/2011   Procedure: INSERTION PORT-A-CATH;  Surgeon: Adin Hector, MD;  Location: [facility];  Service: General;  Laterality: Left;  Insertion of Port-A-Catheter Left Internal Jugular  . TONSILLECTOMY   1957 - approximate    Current Outpatient Medications  Medication Sig Dispense Refill  . acetaminophen (TYLENOL) 500 MG tablet Take 1,000 mg by mouth every 6 (six) hours as needed. Pain     . Ascorbic Acid (VITAMIN C PO) Take 1 tablet by mouth every morning.     Marland Kitchen aspirin EC 81 MG tablet Take 81 mg by mouth daily.    Marland Kitchen glucose blood test strip Use as instructed 100 each 12  . insulin aspart (NOVOLOG) 100 UNIT/ML injection INJECT 8-15 UNITS THREE TIMES DAILY BEFORE MEALS 10 mL 6  . insulin degludec (TRESIBA FLEXTOUCH) 100 UNIT/ML SOPN FlexTouch Pen Inject 0.16-0.2 mLs (16-20 Units total) into the skin every morning. 15 pen 6  . levocetirizine (XYZAL) 5 MG tablet Take 1 tablet (5 mg total) by mouth daily. 30 tablet 11  . lisinopril (PRINIVIL,ZESTRIL) 2.5 MG tablet Take 1 tablet (2.5 mg total) by mouth daily. 90 tablet 3  . Multiple Vitamin (MULTIVITAMIN) tablet Take 1 tablet by mouth every morning.     . simvastatin (ZOCOR) 40 MG tablet Take 1 tablet (40 mg total) by mouth at bedtime. 90 tablet 3  . triamcinolone (NASACORT) 55 MCG/ACT AERO nasal inhaler INSTILL 1 SPRAY IN EACH NOSTRIL ONCE OR TWICE DAILY AS NEEDED. 16.5 Bottle 11   No current facility-administered medications for this visit.     Allergies as of 11/24/2017 - Review Complete  11/24/2017  Allergen Reaction Noted  . Oxycodone Nausea And Vomiting 03/13/2012    Family History  Problem Relation Age of Onset  . Colon polyps Father   . Lung cancer Father   . Diabetes Father   . Prostate cancer Father   . Breast cancer Mother   . Pancreatitis Mother        intestinal adhesions  . Insomnia Mother   . Colon cancer Neg Hx     Social History   Socioeconomic History  . Marital status: Single    Spouse name: Not on file  . Number of children: 0  . Years of education: 16  . Highest education level: Bachelor's degree (e.g., BA, AB, BS)  Occupational History  . Occupation: Retired    Comment: Geographical information systems officer  .  Financial resource strain: Not hard at all  . Food insecurity:    Worry: Never true    Inability: Never true  . Transportation needs:    Medical: No    Non-medical: No  Tobacco Use  . Smoking status: Former Smoker    Packs/day: 1.00    Years: 15.00    Pack years: 15.00    Types: Cigarettes    Last attempt to quit: 04/18/1993    Years since quitting: 24.6  . Smokeless tobacco: Never Used  . Tobacco comment: marijuana every night   Substance and Sexual Activity  . Alcohol use: Yes    Alcohol/week: 5.0 standard drinks    Types: 3 Cans of beer, 2 Shots of liquor per week    Comment: 1 drink every 2 days  . Drug use: Yes    Types: Marijuana    Comment: once a night marijuana  . Sexual activity: Not Currently  Lifestyle  . Physical activity:    Days per week: 5 days    Minutes per session: 40 min  . Stress: Only a little  Relationships  . Social connections:    Talks on phone: More than three times a week    Gets together: More than three times a week    Attends religious service: Never    Active member of club or organization: No    Attends meetings of clubs or organizations: Never    Relationship status: Never married  . Intimate partner violence:    Fear of current or ex partner: Not on file    Emotionally abused: Not on file    Physically abused: Not on file    Forced sexual activity: Not on file  Other Topics Concern  . Not on file  Social History Narrative  . Not on file     Physical Exam: BP (!) 158/74 (BP Location: Left Arm, Patient Position: Sitting, Cuff Size: Normal)   Pulse 84   Ht 6' 1.75" (1.873 m) Comment: height measured without shoes  Wt 167 lb 8 oz (76 kg)   BMI 21.65 kg/m  Constitutional: generally well-appearing Psychiatric: alert and oriented x3 Eyes: extraocular movements intact Mouth: oral pharynx moist, no lesions Neck: supple no lymphadenopathy Cardiovascular: heart regular rate and rhythm Lungs: clear to auscultation  bilaterally Abdomen: soft, nontender, nondistended, no obvious ascites, no peritoneal signs, normal bowel sounds Extremities: no lower extremity edema bilaterally Skin: no lesions on visible extremities   Assessment and plan: 67 y.o. male with personal history colon cancer, genetically proven Lynch syndrome  We discussed the implications of Lynch syndrome.  He understands that he should have colonoscopy for high risk colon cancer screening annually as  well as upper endoscopy every 2 to 3 years.  We will get him up-to-date on those screening recommendations with colonoscopy, upper endoscopy at his soonest convenience.   I see no reason for any further blood tests or imaging studies prior to then.    Please see the "Patient Instructions" section for addition details about the plan.   Owens Loffler, MD [facility] 11/24/2017, 8:27 AM  Cc: Chipper Herb, MD

## 2017-12-12 ENCOUNTER — Inpatient Hospital Stay: Payer: Medicare Other | Attending: Oncology

## 2017-12-12 DIAGNOSIS — C182 Malignant neoplasm of ascending colon: Secondary | ICD-10-CM | POA: Diagnosis not present

## 2017-12-12 LAB — CEA (IN HOUSE-CHCC): CEA (CHCC-In House): 6.03 ng/mL — ABNORMAL HIGH (ref 0.00–5.00)

## 2017-12-14 ENCOUNTER — Telehealth: Payer: Self-pay

## 2017-12-14 NOTE — Telephone Encounter (Addendum)
Pt voiced understanding of message below    ----- Message from Ladell Pier, MD sent at 12/12/2017  5:58 PM EDT ----- Please call patient, CEA is stable-chronic mild elevation, follow-up as scheduled

## 2018-01-16 ENCOUNTER — Ambulatory Visit (AMBULATORY_SURGERY_CENTER): Payer: Medicare Other | Admitting: Gastroenterology

## 2018-01-16 ENCOUNTER — Encounter: Payer: Self-pay | Admitting: Gastroenterology

## 2018-01-16 VITALS — BP 121/58 | HR 63 | Temp 98.7°F | Resp 12 | Ht 73.0 in | Wt 167.0 lb

## 2018-01-16 DIAGNOSIS — Z85038 Personal history of other malignant neoplasm of large intestine: Secondary | ICD-10-CM | POA: Diagnosis not present

## 2018-01-16 DIAGNOSIS — D132 Benign neoplasm of duodenum: Secondary | ICD-10-CM | POA: Diagnosis not present

## 2018-01-16 DIAGNOSIS — Z1509 Genetic susceptibility to other malignant neoplasm: Secondary | ICD-10-CM

## 2018-01-16 DIAGNOSIS — E119 Type 2 diabetes mellitus without complications: Secondary | ICD-10-CM | POA: Diagnosis not present

## 2018-01-16 DIAGNOSIS — K2970 Gastritis, unspecified, without bleeding: Secondary | ICD-10-CM | POA: Diagnosis not present

## 2018-01-16 DIAGNOSIS — Z1211 Encounter for screening for malignant neoplasm of colon: Secondary | ICD-10-CM | POA: Diagnosis not present

## 2018-01-16 DIAGNOSIS — I4891 Unspecified atrial fibrillation: Secondary | ICD-10-CM | POA: Diagnosis not present

## 2018-01-16 DIAGNOSIS — K2950 Unspecified chronic gastritis without bleeding: Secondary | ICD-10-CM | POA: Diagnosis not present

## 2018-01-16 DIAGNOSIS — I10 Essential (primary) hypertension: Secondary | ICD-10-CM | POA: Diagnosis not present

## 2018-01-16 DIAGNOSIS — Z8509 Personal history of malignant neoplasm of other digestive organs: Secondary | ICD-10-CM

## 2018-01-16 MED ORDER — SODIUM CHLORIDE 0.9 % IV SOLN: 500.0000 mL | Freq: Once | INTRAVENOUS | Status: DC

## 2018-01-16 NOTE — Patient Instructions (Signed)
Thank for allowing [place] to care for you today!  Await pathology results by mail, approx. 2 weeks.  Repeat colonoscopy in 1 year.  Resume previous medications and diet today.  Return to normal activities tomorrow.     YOU HAD AN ENDOSCOPIC PROCEDURE TODAY AT [facility]:   Refer to the procedure report that was given to you for any specific questions about what was found during the examination.  If the procedure report does not answer your questions, please call your gastroenterologist to clarify.  If you requested that your care partner not be given the details of your procedure findings, then the procedure report has been included in a sealed envelope for you to review at your convenience later.  YOU SHOULD EXPECT: Some feelings of bloating in the abdomen. Passage of more gas than usual.  Walking can help get rid of the air that was put into your GI tract during the procedure and reduce the bloating. If you had a lower endoscopy (such as a colonoscopy or flexible sigmoidoscopy) you may notice spotting of blood in your stool or on the toilet paper. If you underwent a bowel prep for your procedure, you may not have a normal bowel movement for a few days.  Please Note:  You might notice some irritation and congestion in your nose or some drainage.  This is from the oxygen used during your procedure.  There is no need for concern and it should clear up in a day or so.  SYMPTOMS TO REPORT IMMEDIATELY:   Following lower endoscopy (colonoscopy or flexible sigmoidoscopy):  Excessive amounts of blood in the stool  Significant tenderness or worsening of abdominal pains  Swelling of the abdomen that is new, acute  Fever of 100F or higher   Following upper endoscopy (EGD)  Vomiting of blood or coffee ground material  New chest pain or pain under the shoulder blades  Painful or persistently difficult swallowing  New shortness of breath  Fever of 100F or higher  Black,  tarry-looking stools  For urgent or emergent issues, a gastroenterologist can be reached at any hour by calling [phone].   DIET:  We do recommend a small meal at first, but then you may proceed to your regular diet.  Drink plenty of fluids but you should avoid alcoholic beverages for 24 hours.  ACTIVITY:  You should plan to take it easy for the rest of today and you should NOT DRIVE or use heavy machinery until tomorrow (because of the sedation medicines used during the test).    FOLLOW UP: Our staff will call the number listed on your records the next business day following your procedure to check on you and address any questions or concerns that you may have regarding the information given to you following your procedure. If we do not reach you, we will leave a message.  However, if you are feeling well and you are not experiencing any problems, there is no need to return our call.  We will assume that you have returned to your regular daily activities without incident.  If any biopsies were taken you will be contacted by phone or by letter within the next 1-3 weeks.  Please call us at [phone] if you have not heard about the biopsies in 3 weeks.    SIGNATURES/CONFIDENTIALITY: You and/or your care partner have signed paperwork which will be entered into your electronic medical record.  These signatures attest to the fact that that the  information above on your After Visit Summary has been reviewed and is understood.  Full responsibility of the confidentiality of this discharge information lies with you and/or your care-partner.

## 2018-01-16 NOTE — Op Note (Signed)
[facility] Patient Name: Greg Robertson Procedure Date: 01/16/2018 1:07 PM MRN: [id number] Endoscopist: Milus Banister , MD Age: 67 Referring MD:  Date of Birth: 1950/04/30 Gender: Male Account #: [id number] Procedure:                Colonoscopy Indications:              High risk colon cancer surveillance: Personal                            history of colon cancer; T3N1Colon cancer diagnosed                            during colonoscopy 2012 Dr. Erskine Emery. Cancer                            in the cecum. He underwent a right colectomy                            February 2013, then adjuvant FOLFOX. Tumor                            pathology confirmed microsatellite unstable. Loss                            of expression of PMS 2. 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 He has Lynch syndrome.                            Colonoscopy February 2014 showed no polyps.                            Colonoscopy 08/2014 showed no polyps. Medicines:                Monitored Anesthesia Care Procedure:                Pre-Anesthesia Assessment:                           - Prior to the procedure, a History and Physical                            was performed, and patient medications and                            allergies were reviewed. The patient's tolerance of                            previous anesthesia was also reviewed. The risks                            and benefits of the procedure and the sedation  options and risks were discussed with the patient.                            All questions were answered, and informed consent                            was obtained. Prior Anticoagulants: The patient has                            taken no previous anticoagulant or antiplatelet                            agents. ASA Grade Assessment: II - A patient with                            mild systemic disease. After  reviewing the risks                            and benefits, the patient was deemed in                            satisfactory condition to undergo the procedure.                           After obtaining informed consent, the colonoscope                            was passed under direct vision. Throughout the                            procedure, the patient's blood pressure, pulse, and                            oxygen saturations were monitored continuously. The                            Colonoscope was introduced through the anus and                            advanced to the the ileocolonic anastomosis. The                            colonoscopy was performed without difficulty. The                            patient tolerated the procedure well. The quality                            of the bowel preparation was good. The rectum was                            photographed. Scope In: 1:44:05 PM Scope Out: 1:52:41 PM Scope Withdrawal Time: 0 hours 6 minutes 19 seconds  Total Procedure Duration:  0 hours 8 minutes 36 seconds  Findings:                 Normal appearing right sided ileocolonic                            anastomosis.                           The examination was otherwise normal. Complications:            No immediate complications. Estimated blood loss:                            None. Estimated Blood Loss:     Estimated blood loss: none. Impression:               - Normal appearing right sided ileocolonic                            anastomosis.                           - The entire examined colon is normal on direct and                            retroflexion views. Recommendation:           - Patient has a contact number available for                            emergencies. The signs and symptoms of potential                            delayed complications were discussed with the                            patient. Return to normal activities tomorrow.                             Written discharge instructions were provided to the                            patient.                           - Resume previous diet.                           - Continue present medications.                           - Repeat colonoscopy in 1 year for screening given                            known Lynch Syndrome. Milus Banister, MD 01/16/2018 2:12:52 PM This report has been signed electronically.

## 2018-01-16 NOTE — Progress Notes (Signed)
Called to room to assist during endoscopic procedure.  Patient ID and intended procedure confirmed with present staff. Received instructions for my participation in the procedure from the performing physician.  

## 2018-01-16 NOTE — Progress Notes (Signed)
Report given to PACU, vss 

## 2018-01-16 NOTE — Op Note (Signed)
[facility] Patient Name: Greg Robertson Procedure Date: 01/16/2018 1:07 PM MRN: [id number] Endoscopist: Milus Banister , MD Age: 67 Referring MD:  Date of Birth: 06-Jan-1951 Gender: Male Account #: [id number] Procedure:                Upper GI endoscopy Indications:              Screening EGD given known Lynch Syndrome Medicines:                Monitored Anesthesia Care Procedure:                Pre-Anesthesia Assessment:                           - Prior to the procedure, a History and Physical                            was performed, and patient medications and                            allergies were reviewed. The patient's tolerance of                            previous anesthesia was also reviewed. The risks                            and benefits of the procedure and the sedation                            options and risks were discussed with the patient.                            All questions were answered, and informed consent                            was obtained. Prior Anticoagulants: The patient has                            taken no previous anticoagulant or antiplatelet                            agents. ASA Grade Assessment: II - A patient with                            mild systemic disease. After reviewing the risks                            and benefits, the patient was deemed in                            satisfactory condition to undergo the procedure.                           After obtaining informed consent, the endoscope was  passed under direct vision. Throughout the                            procedure, the patient's blood pressure, pulse, and                            oxygen saturations were monitored continuously. The                            Endoscope was introduced through the mouth, and                            advanced to the third part of duodenum. The upper                            GI endoscopy  was accomplished without difficulty.                            The patient tolerated the procedure well. Scope In: Scope Out: Findings:                 The esophagus was normal.                           Mild inflammation characterized by erythema and                            granularity was found in the gastric antrum.                            Biopsies were taken with a cold forceps for                            histology.                           One 14 mm sessile polyp was found in the second                            portion of the duodenum. The polyp was removed with                            a piecemeal technique using a cold snare. Resection                            and retrieval were complete. The site was tattooed                            with a submucosal injection of Spot (carbon black). Complications:            No immediate complications. Estimated blood loss:                            None. Estimated Blood Loss:     Estimated blood loss: none. Impression:               -  Normal esophagus.                           - Mild gastritis, biopsied to check for H. pylori.                           - One duodenal polyp. Resected and retrieved. The                            site was labeled with Spot following polyp removal. Recommendation:           - Patient has a contact number available for                            emergencies. The signs and symptoms of potential                            delayed complications were discussed with the                            patient. Return to normal activities tomorrow.                            Written discharge instructions were provided to the                            patient.                           - Resume previous diet.                           - Continue present medications.                           - Await pathology results. Milus Banister, MD 01/16/2018 2:16:52 PM This report has been signed  electronically.

## 2018-01-17 ENCOUNTER — Ambulatory Visit (INDEPENDENT_AMBULATORY_CARE_PROVIDER_SITE_OTHER): Payer: Medicare Other | Admitting: Family Medicine

## 2018-01-17 ENCOUNTER — Encounter: Payer: Self-pay | Admitting: Family Medicine

## 2018-01-17 ENCOUNTER — Telehealth: Payer: Self-pay

## 2018-01-17 VITALS — BP 148/84 | HR 76 | Temp 98.1°F | Ht 73.0 in | Wt 168.0 lb

## 2018-01-17 DIAGNOSIS — I700 Atherosclerosis of aorta: Secondary | ICD-10-CM | POA: Diagnosis not present

## 2018-01-17 DIAGNOSIS — E10649 Type 1 diabetes mellitus with hypoglycemia without coma: Secondary | ICD-10-CM | POA: Diagnosis not present

## 2018-01-17 DIAGNOSIS — D696 Thrombocytopenia, unspecified: Secondary | ICD-10-CM | POA: Diagnosis not present

## 2018-01-17 DIAGNOSIS — E559 Vitamin D deficiency, unspecified: Secondary | ICD-10-CM

## 2018-01-17 DIAGNOSIS — Z23 Encounter for immunization: Secondary | ICD-10-CM

## 2018-01-17 DIAGNOSIS — I739 Peripheral vascular disease, unspecified: Secondary | ICD-10-CM | POA: Diagnosis not present

## 2018-01-17 DIAGNOSIS — Z1509 Genetic susceptibility to other malignant neoplasm: Secondary | ICD-10-CM

## 2018-01-17 DIAGNOSIS — N400 Enlarged prostate without lower urinary tract symptoms: Secondary | ICD-10-CM | POA: Diagnosis not present

## 2018-01-17 DIAGNOSIS — E785 Hyperlipidemia, unspecified: Secondary | ICD-10-CM | POA: Diagnosis not present

## 2018-01-17 DIAGNOSIS — I10 Essential (primary) hypertension: Secondary | ICD-10-CM

## 2018-01-17 DIAGNOSIS — E1065 Type 1 diabetes mellitus with hyperglycemia: Secondary | ICD-10-CM | POA: Diagnosis not present

## 2018-01-17 DIAGNOSIS — E1069 Type 1 diabetes mellitus with other specified complication: Secondary | ICD-10-CM | POA: Diagnosis not present

## 2018-01-17 LAB — BAYER DCA HB A1C WAIVED: HB A1C: 8.9 % — AB (ref <7.0)

## 2018-01-17 MED ORDER — LISINOPRIL 5 MG PO TABS: 5.0000 mg | ORAL_TABLET | Freq: Every day | ORAL | 3 refills | Status: DC

## 2018-01-17 NOTE — Patient Instructions (Addendum)
Medicare Annual Wellness Visit  [facility] and the medical providers at [facility] strive to bring you the best medical care.  In doing so we not only want to address your current medical conditions and concerns but also to detect new conditions early and prevent illness, disease and health-related problems.    Medicare offers a yearly Wellness Visit which allows our clinical staff to assess your need for preventative services including immunizations, lifestyle education, counseling to decrease risk of preventable diseases and screening for fall risk and other medical concerns.    This visit is provided free of charge (no copay) for all Medicare recipients. The clinical pharmacists at [facility] have begun to conduct these Wellness Visits which will also include a thorough review of all your medications.    As you primary medical provider recommend that you make an appointment for your Annual Wellness Visit if you have not done so already this year.  You may set up this appointment before you leave today or you may call back ([phone]) and schedule an appointment.  Please make sure when you call that you mention that you are scheduling your Annual Wellness Visit with the clinical pharmacist so that the appointment may be made for the proper length of time.     Continue current medications. Continue good therapeutic lifestyle changes which include good diet and exercise. Fall precautions discussed with patient. If an FOBT was given today- please return it to our front desk. If you are over 81 years old - you may need Prevnar 3 or the adult Pneumonia vaccine.  **Flu shots are available--- please call and schedule a FLU-CLINIC appointment**  After your visit with [place] today you will receive a survey in the mail or online from [employer] regarding your care with [place]. Please take a moment to fill this out. Your feedback is very  important to [place] as you can help [place] better understand your patient needs as well as improve your experience and satisfaction. WE CARE ABOUT YOU!!!   We will schedule you to see the cardiologist that comes to [facility] because of your increased risk factors for heart disease and past history of atrial fibrillation. Continue to follow-up with a gastroenterologist as he recommends We will call with lab work results as soon as those results become available If needed take Mucinex for cough and congestion but continue to drink plenty of fluids and most likely the throat irritation following the endoscopy will resolve on its own. Check blood sugars regularly We will increase her lisinopril from 2-1/2 mg daily to 5 mg daily and the patient is reminded to check some blood pressures at home and bring these readings with him to the office in about 4 weeks and have the nurse check his blood pressure here He should watch his salt intake closely.

## 2018-01-17 NOTE — Progress Notes (Signed)
Subjective:    Patient ID: Greg Robertson, male    DOB: September 28, 1950, 67 y.o.   MRN: [id number]  HPI  Pt here for follow up and management of chronic medical problems which includes diabetes and hypertension. He is taking medication regularly.  Patient recently had an endoscopy and a colonoscopy.  The colonoscopy was clear and this is great because he has had colon resection because of colon cancer.  The anastomotic site looked good according to the endoscopist from his report.  He did have some gastritis.  He complains today of a slight cough.  He will go ahead and get his flu shot since he is not running a fever.  She today denies any chest pain pressure tightness or shortness of breath.  He has a slight cough and throat irritation from the endoscopy that was done recently.  He denies any symptoms with his intestinal tract including nausea vomiting diarrhea blood in the stool or black tarry bowel movements.  He is still waiting the pathology results from a sessile polyp that was removed and from the finding of gastritis in his stomach.  The gastroenterologist will let him know of the results of this as soon as possible.  He is passing his water well without any symptoms.  He has not seen a cardiologist in a good while.  He does have risk factors for heart disease and he does have a past history of atrial fib when he was in the hospital.  We will schedule him to see the cardiologist that comes to [facility]  He is also due to get his eye exam done.    Patient Active Problem List   Diagnosis Date Noted  . COPD (chronic obstructive pulmonary disease) ([place]) 09/05/2017  . Aortic atherosclerosis ([place]) 09/04/2017  . Benign prostatic hyperplasia 09/04/2017  . Thrombocytopenia ([place]) 01/22/2015  . 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 Nausea and vomiting 04/06/2013  . Diabetes 1.5, managed as type 1 ([place]) 10/29/2012  . HTN (hypertension) 03/14/2012  . Chronic diastolic  heart failure, NYHA class 1 ([place]) 03/12/2012  . Demand ischemia ([place]) 03/12/2012  . Fracture of left clavicle-distal 03/12/2012  . NSTEMI (non-ST elevated myocardial infarction) ([place]) 03/11/2012  . Atrial fibrillation with RVR ([place]) 03/11/2012  . Diabetic ketoacidosis ([place]) 03/10/2012  . Dehydration, severe 03/10/2012  . Right bundle branch block 03/10/2012  . Atrial fibrillation by electrocardiogram ([place]) 03/10/2012  . Elevated troponin I level 03/10/2012  . Lynch syndrome 07/28/2011  . Malignant neoplasm of ascending colon ([place]) 05/25/2011  . Nonspecific abnormal finding in stool contents 05/10/2011  . Diabetes mellitus type 2, uncontrolled, without complications ([place]) 56/43/3295  . Glaucoma 05/10/2011   Outpatient Encounter Medications as of 01/17/2018  Medication Sig  . acetaminophen (TYLENOL) 500 MG tablet Take 1,000 mg by mouth every 6 (six) hours as needed. Pain   . Ascorbic Acid (VITAMIN C PO) Take 1 tablet by mouth every morning.   Marland Kitchen aspirin EC 81 MG tablet Take 81 mg by mouth daily.  Marland Kitchen glucose blood test strip Use as instructed  . insulin aspart (NOVOLOG) 100 UNIT/ML injection INJECT 8-15 UNITS THREE TIMES DAILY BEFORE MEALS  . insulin degludec (TRESIBA FLEXTOUCH) 100 UNIT/ML SOPN FlexTouch Pen Inject 0.16-0.2 mLs (16-20 Units total) into the skin every morning.  Marland Kitchen levocetirizine (XYZAL) 5 MG tablet Take 1 tablet (5 mg total) by mouth daily.  Marland Kitchen lisinopril (PRINIVIL,ZESTRIL) 2.5 MG tablet Take 1 tablet (2.5 mg total) by mouth daily.  Marland Kitchen  Multiple Vitamin (MULTIVITAMIN) tablet Take 1 tablet by mouth every morning.   . simvastatin (ZOCOR) 40 MG tablet Take 1 tablet (40 mg total) by mouth at bedtime.  . triamcinolone (NASACORT) 55 MCG/ACT AERO nasal inhaler INSTILL 1 SPRAY IN EACH NOSTRIL ONCE OR TWICE DAILY AS NEEDED.   No facility-administered encounter medications on file as of 01/17/2018.      Review of Systems  Constitutional: Negative.   HENT: Negative.   Eyes: Negative.    Respiratory: Positive for cough (from throat irritation - endoscopy yesterday).   Cardiovascular: Negative.   Gastrointestinal: Negative.   Endocrine: Negative.   Genitourinary: Negative.   Musculoskeletal: Negative.   Skin: Negative.   Allergic/Immunologic: Negative.   Neurological: Negative.   Hematological: Negative.   Psychiatric/Behavioral: Negative.        Objective:   Physical Exam  Constitutional: He is oriented to person, place, and time. He appears well-developed and well-nourished. No distress.  The patient looks good and is pleasant and alert and calm.  HENT:  Head: Normocephalic and atraumatic.  Right Ear: External ear normal.  Left Ear: External ear normal.  Nose: Nose normal.  Mouth/Throat: Oropharynx is clear and moist. No oropharyngeal exudate.  Eyes: Pupils are equal, round, and reactive to light. Conjunctivae and EOM are normal. Right eye exhibits no discharge. Left eye exhibits no discharge. No scleral icterus.  Patient is in need of eye exam and sees Dr. Katy Fitch in [place].  Neck: Normal range of motion. Neck supple. No thyromegaly present.  No bruits audible no thyromegaly or anterior cervical adenopathy  Cardiovascular: Normal rate, regular rhythm and normal heart sounds.  No murmur heard. The heart is regular at 60/min.  The pedal pulses in the left foot were difficult to palpate.  Pulmonary/Chest: Effort normal and breath sounds normal. No respiratory distress. He has no wheezes. He has no rales.  Lungs were clear anteriorly and posteriorly with no axillary adenopathy  Abdominal: Soft. Bowel sounds are normal. He exhibits no mass. There is no tenderness. There is no rebound and no guarding.  No spleen or liver enlargement.  No epigastric tenderness.  No masses no bruits and no inguinal adenopathy with good inguinal pulses.  Musculoskeletal: Normal range of motion. He exhibits no edema or deformity.  Lymphadenopathy:    He has no cervical adenopathy.    Neurological: He is alert and oriented to person, place, and time. He has normal reflexes. No cranial nerve deficit.  Skin: Skin is warm and dry. No rash noted. No erythema.  Thick nail fungus on toes of both feet especially great toes.  Patient is encouraged to see podiatrist for care of this.  Psychiatric: He has a normal mood and affect. His behavior is normal. Judgment and thought content normal.  Patient's mood affect and behavior were normal for him today.  Nursing note and vitals reviewed.   BP 140/80 (BP Location: Left Arm)   Pulse 76   Temp 98.1 F (36.7 C) (Oral)   Ht 6' 1"  (1.854 m)   Wt 168 lb (76.2 kg)   BMI 22.16 kg/m        Assessment & Plan:  1. Type 1 diabetes mellitus with hyperglycemia ([place]) -Patient says that blood sugars at home run between 135 and 180 and this includes fasting and during the day. - CBC with Differential/Platelet - Bayer DCA Hb A1c Waived - Ambulatory referral to Cardiology  2. 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 Essential hypertension -The blood pressure was slightly elevated on the systolic side today.  He is only taking lisinopril 2-1/2 mg daily.  We will increase this to 5 mg daily and he will check blood pressure readings at home and come by the office and 4 weeks for the nurse to check the blood pressure here.  He will also try to watch his salt intake more closely. - BMP8+EGFR - CBC with Differential/Platelet - Hepatic function panel - Ambulatory referral to Cardiology  4. Vitamin D deficiency -Continue with vitamin D replacement pending results of lab work - CBC with Differential/Platelet - VITAMIN D 25 Hydroxy (Vit-D Deficiency, Fractures)  5. Benign prostatic hyperplasia, unspecified whether lower urinary  tract symptoms present -No complaints today with voiding - CBC with Differential/Platelet  6. Lynch syndrome -Follow-up with gastroenterology as planned with regular colonoscopies.  7. Aortic atherosclerosis ([place]) -Continue aggressive therapeutic lifestyle changes and statin therapy pending results of lab work and follow-up with cardiology as planned - Ambulatory referral to Cardiology  8. Peripheral vascular insufficiency ([facility]) -Diminished pedal pulses left foot but sensation intact.  9. Hypoglycemia unawareness in type 1 diabetes mellitus ([place]) -Patient says that he is more aware of his hypoglycemic episodes and especially at nighttime as he wakes up with sweating when these happen.  10. Thrombocytopenia ([place]) -No bleeding issues noted by patient.  No increased bruising noted by patient.  Patient Instructions                       Medicare Annual Wellness Visit  [facility] and the medical providers at [facility] strive to bring you the best medical care.  In doing so we not only want to address your current medical conditions and concerns but also to detect new conditions early and prevent illness, disease and health-related problems.    Medicare offers a yearly Wellness Visit which allows our clinical staff to assess your need for preventative services including immunizations, lifestyle education, counseling to decrease risk of preventable diseases and screening for fall risk and other medical concerns.    This visit is provided free of charge (no copay) for all Medicare recipients. The clinical pharmacists at [facility] have begun to conduct these Wellness Visits which will also include a thorough review of all your medications.    As you primary medical provider recommend that you make an appointment for your Annual Wellness Visit if you have not done so already this year.  You may set up this appointment before you leave today or you  may call back ([phone]) and schedule an appointment.  Please make sure when you call that you mention that you are scheduling your Annual Wellness Visit with the clinical pharmacist so that the appointment may be made for the proper length of time.     Continue current medications. Continue good therapeutic lifestyle changes which include good diet and exercise. Fall precautions discussed with patient. If an FOBT was given today- please return it to our front desk. If you are over 93 years old - you may need Prevnar 64 or the adult Pneumonia vaccine.  **Flu shots are available--- please call and schedule a FLU-CLINIC appointment**  After your visit with [place] today you will receive a survey in the mail or online from [employer] regarding your care with  [place]. Please take a moment to fill this out. Your feedback is very important to [place] as you can help [place] better understand your patient needs as well as improve your experience and satisfaction. WE CARE ABOUT YOU!!!   We will schedule you to see the cardiologist that comes to [facility] because of your increased risk factors for heart disease and past history of atrial fibrillation. Continue to follow-up with a gastroenterologist as he recommends We will call with lab work results as soon as those results become available If needed take Mucinex for cough and congestion but continue to drink plenty of fluids and most likely the throat irritation following the endoscopy will resolve on its own. Check blood sugars regularly We will increase her lisinopril from 2-1/2 mg daily to 5 mg daily and the patient is reminded to check some blood pressures at home and bring these readings with him to the office in about 4 weeks and have the nurse check his blood pressure here He should watch his salt intake closely.   Arrie Senate MD

## 2018-01-17 NOTE — Telephone Encounter (Signed)
  Follow up Call-  Call back number 01/16/2018  Post procedure Call Back phone  # [phone]  Permission to leave phone message Yes  Some recent data might be hidden     Patient questions:  Do you have a fever, pain , or abdominal swelling? No. Pain Score  0 *  Have you tolerated food without any problems? Yes.    Have you been able to return to your normal activities? Yes.    Do you have any questions about your discharge instructions: Diet   No. Medications  No. Follow up visit  No.  Do you have questions or concerns about your Care? No.  Actions: * If pain score is 4 or above: No action needed, pain <4.

## 2018-01-18 LAB — HEPATIC FUNCTION PANEL
ALT: 16 IU/L (ref 0–44)
AST: 21 IU/L (ref 0–40)
Albumin: 4.9 g/dL — ABNORMAL HIGH (ref 3.6–4.8)
Alkaline Phosphatase: 88 IU/L (ref 39–117)
BILIRUBIN TOTAL: 0.5 mg/dL (ref 0.0–1.2)
BILIRUBIN, DIRECT: 0.17 mg/dL (ref 0.00–0.40)
Total Protein: 6.6 g/dL (ref 6.0–8.5)

## 2018-01-18 LAB — CBC WITH DIFFERENTIAL/PLATELET
Basophils Absolute: 0 10*3/uL (ref 0.0–0.2)
Basos: 0 %
EOS (ABSOLUTE): 0.1 10*3/uL (ref 0.0–0.4)
Eos: 1 %
Hematocrit: 42.9 % (ref 37.5–51.0)
Hemoglobin: 14.7 g/dL (ref 13.0–17.7)
IMMATURE GRANULOCYTES: 0 %
Immature Grans (Abs): 0 10*3/uL (ref 0.0–0.1)
Lymphocytes Absolute: 1.4 10*3/uL (ref 0.7–3.1)
Lymphs: 29 %
MCH: 31.2 pg (ref 26.6–33.0)
MCHC: 34.3 g/dL (ref 31.5–35.7)
MCV: 91 fL (ref 79–97)
MONOS ABS: 0.6 10*3/uL (ref 0.1–0.9)
Monocytes: 12 %
Neutrophils Absolute: 2.9 10*3/uL (ref 1.4–7.0)
Neutrophils: 58 %
PLATELETS: 144 10*3/uL — AB (ref 150–450)
RBC: 4.71 x10E6/uL (ref 4.14–5.80)
RDW: 12.9 % (ref 12.3–15.4)
WBC: 5 10*3/uL (ref 3.4–10.8)

## 2018-01-18 LAB — BMP8+EGFR
BUN / CREAT RATIO: 14 (ref 10–24)
BUN: 15 mg/dL (ref 8–27)
CO2: 26 mmol/L (ref 20–29)
Calcium: 9.5 mg/dL (ref 8.6–10.2)
Chloride: 101 mmol/L (ref 96–106)
Creatinine, Ser: 1.04 mg/dL (ref 0.76–1.27)
GFR calc Af Amer: 86 mL/min/{1.73_m2} (ref >59)
GFR, EST NON AFRICAN AMERICAN: 74 mL/min/{1.73_m2} (ref >59)
GLUCOSE: 129 mg/dL — AB (ref 65–99)
POTASSIUM: 5 mmol/L (ref 3.5–5.2)
SODIUM: 141 mmol/L (ref 134–144)

## 2018-01-18 LAB — LIPID PANEL
Chol/HDL Ratio: 2.2 ratio (ref 0.0–5.0)
Cholesterol, Total: 205 mg/dL — ABNORMAL HIGH (ref 100–199)
HDL: 93 mg/dL (ref >39)
LDL Calculated: 100 mg/dL — ABNORMAL HIGH (ref 0–99)
TRIGLYCERIDES: 60 mg/dL (ref 0–149)
VLDL Cholesterol Cal: 12 mg/dL (ref 5–40)

## 2018-01-18 LAB — VITAMIN D 25 HYDROXY (VIT D DEFICIENCY, FRACTURES): VIT D 25 HYDROXY: 14.1 ng/mL — AB (ref 30.0–100.0)

## 2018-02-26 NOTE — Progress Notes (Signed)
Cardiology Office Note   Date:  02/28/2018   ID:  Greg Robertson, DOB 01/17/1951, MRN [id number]  PCP:  Chipper Herb, MD  Cardiologist:   No primary care provider on file. Referring:  Chipper Herb, MD  Chief Complaint  Patient presents with  . Aortic Atherosclerosis      History of Present Illness: Greg Robertson is a 67 y.o. male who presents for evaluation of aortic atherosclerosis.  He did have an echo in 2013 that ws normal. He saw Dr. Aundra Dubin last in 2014 for evaluation of atrial fib with RVR.  He was treated with dronedarone and Eliquis.       He is had no other cardiac history and it seems that his fibrillation was related to a significant reaction to pain medication after shoulder injury.  He stopped Multitak after some time.  He developed nasal polyps and recurrent bleeding and he stopped Eliquis as well.  However, he has not had any symptomatic recurrence of his atrial fibrillation.  He does some walking for exercise but probably not as much as he should.  He does get short of breathing with significant exertion.  He wants to start an exercise regimen to control his blood sugar.  Is had diabetes for 40 years and his A1c was 9.2.  He is not describing chest pressure, neck or arm discomfort.  Is not having any palpitations, presyncope or syncope.  He denies PND or orthopnea.   Past Medical History:  Diagnosis Date  . Allergic rhinitis   . Anemia   . Atrial fibrillation (HCC)    Persistent  . Cancer of ascending colon, 7cm 05/25/2011  . Diabetes mellitus type I ([place])   . Glaucoma   . Hypertension   . Pneumonia 1979 or 1980  . Prostate nodule   . Substance abuse [facility])     Past Surgical History:  Procedure Laterality Date  . broken left shoulder blade and collar bone    . FINGER SURGERY  2009   right middle  . LAPAROSCOPIC ASSISTED 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    . NASAL FRACTURE SURGERY  1968  . PORTACATH PLACEMENT  07/01/2011   Procedure: INSERTION PORT-A-CATH;  Surgeon: Adin Hector, MD;  Location: [facility];  Service: General;  Laterality: Left;  Insertion of Port-A-Catheter Left Internal Jugular  . TONSILLECTOMY  1957 - approximate     Current Outpatient Medications  Medication Sig Dispense Refill  . acetaminophen (TYLENOL) 500 MG tablet Take 1,000 mg by mouth every 6 (six) hours as needed. Pain     . Ascorbic Acid (VITAMIN C PO) Take 1 tablet by mouth every morning.     Marland Kitchen aspirin EC 81 MG tablet Take 81 mg by mouth daily.    . insulin aspart (NOVOLOG) 100 UNIT/ML injection INJECT 8-15 UNITS THREE TIMES DAILY BEFORE MEALS 10 mL 6  . insulin degludec (TRESIBA FLEXTOUCH) 100 UNIT/ML SOPN FlexTouch Pen Inject 0.16-0.2 mLs (16-20 Units total) into the skin every morning. 15 pen 6  . levocetirizine (XYZAL) 5 MG tablet Take 1 tablet (5 mg total) by mouth daily. 30 tablet 11  . lisinopril (PRINIVIL,ZESTRIL) 5 MG tablet Take 1 tablet (5 mg total) by mouth daily. 90 tablet 3  . Multiple Vitamin (MULTIVITAMIN) tablet Take 1 tablet by mouth every morning.     . simvastatin (ZOCOR) 40 MG tablet Take 1 tablet (40 mg total) by mouth at bedtime. 90 tablet 3  . triamcinolone (NASACORT) 55 MCG/ACT  AERO nasal inhaler INSTILL 1 SPRAY IN EACH NOSTRIL ONCE OR TWICE DAILY AS NEEDED. 16.5 Bottle 11  . glucose blood test strip Use as instructed 100 each 12   No current facility-administered medications for this visit.     Allergies:   Oxycodone    Social History:  The patient  reports that he quit smoking about 24 years ago. His smoking use included cigarettes. He has a 15.00 pack-year smoking history. He has never used smokeless tobacco. He reports that he drinks about 5.0 standard drinks of alcohol per week. He reports that he has current or past drug history. Drug: Marijuana.   Family History:  The patient's family history includes Breast cancer in his mother; Colon polyps in his father; Diabetes in his father; Insomnia in his  mother; Lung cancer in his father; Pancreatitis in his mother; Prostate cancer in his father.    ROS:  Please see the history of present illness.   Otherwise, review of systems are positive for positive for insomnia.   All other systems are reviewed and negative.    PHYSICAL EXAM: VS:  BP (!) 174/88   Pulse 69   Ht 6\' 3"  (1.905 m)   Wt 173 lb (78.5 kg)   BMI 21.62 kg/m  , BMI Body mass index is 21.62 kg/m. GENERAL:  Well appearing HEENT:  Pupils equal round and reactive, fundi not visualized, oral mucosa unremarkable NECK:  No jugular venous distention, waveform within normal limits, carotid upstroke brisk and symmetric, no bruits, no thyromegaly LYMPHATICS:  No cervical, inguinal adenopathy LUNGS:  Clear to auscultation bilaterally BACK:  No CVA tenderness CHEST:  Unremarkable HEART:  PMI not displaced or sustained,S1 and S2 within normal limits, no S3, no S4, no clicks, no rubs, no murmurs ABD:  Flat, positive bowel sounds normal in frequency in pitch, no bruits, no rebound, no guarding, no midline pulsatile mass, no hepatomegaly, no splenomegaly EXT:  2 plus pulses throughout, no edema, no cyanosis no clubbing SKIN:  No rashes no nodules NEURO:  Cranial nerves II through XII grossly intact, motor grossly intact throughout PSYCH:  Cognitively intact, oriented to person place and time    EKG:  EKG is ordered today. The ekg ordered today demonstrates sinus rhythm, rate 69, axis within normal limits, intervals changes.   Recent Labs: 01/17/2018: ALT 16; BUN 15; Creatinine, Ser 1.04; Hemoglobin 14.7; Platelets 144; Potassium 5.0; Sodium 141    Lipid Panel    Component Value Date/Time   CHOL 205 (H) 01/17/2018 1148   CHOL 220 (H) 10/29/2012 1729   TRIG 60 01/17/2018 1148   TRIG 200 (H) 01/21/2015 1051   TRIG 179 (H) 10/29/2012 1729   HDL 93 01/17/2018 1148   HDL 63 01/21/2015 1051   HDL 68 10/29/2012 1729   CHOLHDL 2.2 01/17/2018 1148   LDLCALC 100 (H) 01/17/2018 1148    LDLCALC 116 (H) 10/29/2012 1729      Wt Readings from Last 3 Encounters:  02/28/18 173 lb (78.5 kg)  01/17/18 168 lb (76.2 kg)  01/16/18 167 lb (75.8 kg)      Other studies Reviewed: Additional studies/ records that were reviewed today include: Labs, old cardiology records. Review of the above records demonstrates:  Please see elsewhere in the note.     ASSESSMENT AND PLAN:  DM:   His A1c is not well controlled.  However, he says he has very brittle diabetes and cannot take more medications so he is trying to work on diet and  exercise.  DYSLIPIDEMIA: LDL is slightly elevated but his HDL was excellent.  He will continue the meds as listed.  HTN: Blood pressure is well controlled now continue the meds as listed.  AORTIC ATHEROSCLEROSIS: He does have significant cardiovascular risk factors.  He wants to start an exercise regimen.  There is evidence of vascular disease.  May be some slight shortness of breath. I will bring the patient back for a POET (Plain Old Exercise Test). This will allow me to screen for obstructive coronary disease, risk stratify and very importantly provide a prescription for exercise.  ATRIAL FIB.  He is had no recurrence of this.  No further therapy is indicated.  INSOMNIA: We talked about this.  He can stop drinking alcohol and see if this helps.  After this I would send him back to Dr. Laurance Flatten for consideration of other therapy.    Current medicines are reviewed at length with the patient today.  The patient does not have concerns regarding medicines.  The following changes have been made:  no change  Labs/ tests ordered today include:   Orders Placed This Encounter  Procedures  . EXERCISE TOLERANCE TEST (ETT)  . EKG 12-Lead     Disposition:   FU with me as needed.     Signed, Minus Breeding, MD  02/28/2018 4:27 PM    [facility]

## 2018-02-28 ENCOUNTER — Ambulatory Visit (INDEPENDENT_AMBULATORY_CARE_PROVIDER_SITE_OTHER): Payer: Medicare Other | Admitting: Cardiology

## 2018-02-28 ENCOUNTER — Encounter: Payer: Self-pay | Admitting: Cardiology

## 2018-02-28 VITALS — BP 174/88 | HR 69 | Ht 75.0 in | Wt 173.0 lb

## 2018-02-28 DIAGNOSIS — E785 Hyperlipidemia, unspecified: Secondary | ICD-10-CM | POA: Diagnosis not present

## 2018-02-28 DIAGNOSIS — I700 Atherosclerosis of aorta: Secondary | ICD-10-CM | POA: Diagnosis not present

## 2018-02-28 DIAGNOSIS — R0602 Shortness of breath: Secondary | ICD-10-CM | POA: Diagnosis not present

## 2018-02-28 DIAGNOSIS — I10 Essential (primary) hypertension: Secondary | ICD-10-CM | POA: Diagnosis not present

## 2018-02-28 NOTE — Patient Instructions (Signed)
Medication Instructions:  The current medical regimen is effective;  continue present plan and medications.  If you need a refill on your cardiac medications before your next appointment, please call your pharmacy.   Testing/Procedures: Your physician has requested that you have an exercise tolerance test. For further information please visit [email or web address]. Please also follow instruction sheet, as given.  Follow-Up: Follow up will be based the above results.  Thank you for choosing [facility]!!

## 2018-03-27 ENCOUNTER — Telehealth: Payer: Self-pay | Admitting: Nurse Practitioner

## 2018-03-27 NOTE — Telephone Encounter (Signed)
R/s appt per 12/10 sch message - pt is aware of appt date and time

## 2018-04-05 ENCOUNTER — Inpatient Hospital Stay: Payer: Medicare Other | Attending: Nurse Practitioner | Admitting: Nurse Practitioner

## 2018-04-05 ENCOUNTER — Encounter: Payer: Self-pay | Admitting: Nurse Practitioner

## 2018-04-05 ENCOUNTER — Inpatient Hospital Stay: Payer: Medicare Other

## 2018-04-05 ENCOUNTER — Telehealth: Payer: Self-pay | Admitting: Oncology

## 2018-04-05 VITALS — BP 171/80 | HR 88 | Temp 98.0°F | Resp 18 | Ht 75.0 in | Wt 170.9 lb

## 2018-04-05 DIAGNOSIS — C182 Malignant neoplasm of ascending colon: Secondary | ICD-10-CM | POA: Insufficient documentation

## 2018-04-05 DIAGNOSIS — G4700 Insomnia, unspecified: Secondary | ICD-10-CM | POA: Diagnosis not present

## 2018-04-05 DIAGNOSIS — Z794 Long term (current) use of insulin: Secondary | ICD-10-CM | POA: Insufficient documentation

## 2018-04-05 DIAGNOSIS — E119 Type 2 diabetes mellitus without complications: Secondary | ICD-10-CM | POA: Diagnosis not present

## 2018-04-05 NOTE — Progress Notes (Signed)
  [facility] OFFICE PROGRESS NOTE   Diagnosis: Colon cancer  INTERVAL HISTORY:   Mr. Pisarski returns as scheduled.  He overall feels well.  No change in bowel habits.  No nausea or vomiting.  No abdominal pain.  He has a good appetite.  His main complaint is difficulty sleeping.  Objective:  Vital signs in last 24 hours:  Blood pressure (!) 171/80, pulse 88, temperature 98 F (36.7 C), temperature source Oral, resp. rate 18, height '6\' 3"'$  (1.905 m), weight 170 lb 14.4 oz (77.5 kg), SpO2 100 %.    HEENT: Neck without mass. Lymphatics: No palpable cervical, supraclavicular, axillary or inguinal lymph nodes. Resp: Lungs clear bilaterally. Cardio: Regular rate and rhythm. GI: Abdomen soft and nontender.  No hepatomegaly. Vascular: No leg edema.   Lab Results:  Lab Results  Component Value Date   WBC 5.0 01/17/2018   HGB 14.7 01/17/2018   HCT 42.9 01/17/2018   MCV 91 01/17/2018   PLT 144 (L) 01/17/2018   NEUTROABS 2.9 01/17/2018    Imaging:  No results found.  Medications: I have reviewed the patient's current medications.  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 Tumor microsatellite unstable, K-ras wild type. Adjuvant FOLFOX chemotherapy initiated on 07/14/2011. Cycle 12 given on 01/05/2012.  -Negative surveillance colonoscopy 06/06/2012  -Negative surveillance CT scans 04/25/2013 -Negative surveillance CT scans 07/21/2014 -Negative surveillance colonoscopy 09/10/2014 -Negative surveillance colonoscopy 01/16/2018 2. Microcytic anemia. Likely secondary to iron deficiency. Resolved. 3. Insulin-dependent diabetes. 4. Glaucoma. 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 He has seen a Retail buyer. He  has been contacted with updated information regarding screening recommendations. 6. Status post Port-A-Cath placement 07/01/2011. The Port-A-Cath has been removed. 7. History of thrombocytopenia secondary to chemotherapy. Oxaliplatin was held with cycle 3. Oxaliplatin was resumed with cycle 4 at a 25% dose reduction. Oxaliplatin was further dose reduced beginning with cycle 6 due to cytopenias. The oxaliplatin was held with cycle 8, cycle 10 and cycle 11. 8. History of neutropenia secondary to chemotherapy. 9. Chronic mild elevation of the CEA 10. Upper endoscopy 01/16/2018-normal esophagus;  mild gastritis; 1 duodenal polyp (tubular adenoma); distal stomach biopsy with antral and corpus mucosa with slight chronic inflammation, negative for H. Pylori,  no intestinal metaplasia dysplasia or malignancy.  Disposition: Mr. Yusuf appears stable.  He remains in clinical remission from colon cancer.  We will follow-up on the CEA from today.  He will continue endoscopic evaluation by Dr. Ardis Hughs.  He will return for a CEA and follow-up visit in 6 months.  He will contact the office in the interim with any problems.    Ned Card ANP/GNP-BC   04/05/2018  3:51 PM

## 2018-04-05 NOTE — Telephone Encounter (Signed)
Printed calendar and avs. °

## 2018-04-06 ENCOUNTER — Ambulatory Visit: Payer: Self-pay | Admitting: Nurse Practitioner

## 2018-04-06 LAB — CEA (IN HOUSE-CHCC): CEA (CHCC-In House): 6.15 ng/mL — ABNORMAL HIGH (ref 0.00–5.00)

## 2018-04-24 ENCOUNTER — Telehealth: Payer: Self-pay

## 2018-04-24 NOTE — Telephone Encounter (Signed)
TC made to Pt. Voicemail left to return call. Will try again later

## 2018-04-24 NOTE — Telephone Encounter (Signed)
TC to patient. Another Voicemail left to return call to [facility]. Will try again later.

## 2018-04-25 ENCOUNTER — Telehealth: Payer: Self-pay | Admitting: *Deleted

## 2018-04-25 NOTE — Telephone Encounter (Signed)
-----   Message from Owens Shark, NP sent at 04/24/2018  8:34 AM EST ----- Please let him know the CEA is stable with chronic mild elevation, f/u as scheduled.

## 2018-04-25 NOTE — Telephone Encounter (Signed)
Patient notified of CEA and to f/u as scheduled.

## 2018-04-26 ENCOUNTER — Encounter: Payer: Self-pay | Admitting: Cardiology

## 2018-05-24 ENCOUNTER — Ambulatory Visit (INDEPENDENT_AMBULATORY_CARE_PROVIDER_SITE_OTHER): Payer: Medicare Other | Admitting: Family Medicine

## 2018-05-24 ENCOUNTER — Encounter: Payer: Self-pay | Admitting: Family Medicine

## 2018-05-24 VITALS — BP 135/70 | HR 87 | Temp 97.2°F | Ht 75.0 in | Wt 171.0 lb

## 2018-05-24 DIAGNOSIS — I10 Essential (primary) hypertension: Secondary | ICD-10-CM

## 2018-05-24 DIAGNOSIS — D696 Thrombocytopenia, unspecified: Secondary | ICD-10-CM | POA: Diagnosis not present

## 2018-05-24 DIAGNOSIS — E1065 Type 1 diabetes mellitus with hyperglycemia: Secondary | ICD-10-CM

## 2018-05-24 DIAGNOSIS — I480 Paroxysmal atrial fibrillation: Secondary | ICD-10-CM

## 2018-05-24 DIAGNOSIS — I739 Peripheral vascular disease, unspecified: Secondary | ICD-10-CM | POA: Diagnosis not present

## 2018-05-24 DIAGNOSIS — Z1509 Genetic susceptibility to other malignant neoplasm: Secondary | ICD-10-CM

## 2018-05-24 DIAGNOSIS — I700 Atherosclerosis of aorta: Secondary | ICD-10-CM | POA: Diagnosis not present

## 2018-05-24 DIAGNOSIS — E1069 Type 1 diabetes mellitus with other specified complication: Secondary | ICD-10-CM | POA: Diagnosis not present

## 2018-05-24 DIAGNOSIS — Z1507 Genetic susceptibility to malignant neoplasm of urinary tract: Secondary | ICD-10-CM

## 2018-05-24 DIAGNOSIS — E559 Vitamin D deficiency, unspecified: Secondary | ICD-10-CM | POA: Diagnosis not present

## 2018-05-24 DIAGNOSIS — E785 Hyperlipidemia, unspecified: Secondary | ICD-10-CM | POA: Diagnosis not present

## 2018-05-24 LAB — BAYER DCA HB A1C WAIVED: HB A1C (BAYER DCA - WAIVED): 8.3 % — ABNORMAL HIGH (ref <7.0)

## 2018-05-24 NOTE — Patient Instructions (Addendum)
Medicare Annual Wellness Visit  [facility] and the medical providers at [facility] strive to bring you the best medical care.  In doing so we not only want to address your current medical conditions and concerns but also to detect new conditions early and prevent illness, disease and health-related problems.    Medicare offers a yearly Wellness Visit which allows our clinical staff to assess your need for preventative services including immunizations, lifestyle education, counseling to decrease risk of preventable diseases and screening for fall risk and other medical concerns.    This visit is provided free of charge (no copay) for all Medicare recipients. The clinical pharmacists at [facility] have begun to conduct these Wellness Visits which will also include a thorough review of all your medications.    As you primary medical provider recommend that you make an appointment for your Annual Wellness Visit if you have not done so already this year.  You may set up this appointment before you leave today or you may call back ([phone]) and schedule an appointment.  Please make sure when you call that you mention that you are scheduling your Annual Wellness Visit with the clinical pharmacist so that the appointment may be made for the proper length of time.     Continue current medications. Continue good therapeutic lifestyle changes which include good diet and exercise. Fall precautions discussed with patient. If an FOBT was given today- please return it to our front desk. If you are over 5 years old - you may need Prevnar 67 or the adult Pneumonia vaccine.  **Flu shots are available--- please call and schedule a FLU-CLINIC appointment**  After your visit with [place] today you will receive a survey in the mail or online from [employer] regarding your care with [place]. Please take a moment to fill this out. Your feedback is very  important to [place] as you can help [place] better understand your patient needs as well as improve your experience and satisfaction. WE CARE ABOUT YOU!!!   Continue to follow-up with oncology and gastroenterology as planned Continue to monitor blood sugars closely and especially when any infections might develop causing the sugar to be out of line more. Always drink plenty of fluids and stay well-hydrated For the rest of the winter months, use good hand and respiratory hygiene. Regular follow-up with cardiology because of increased risk factors

## 2018-05-24 NOTE — Progress Notes (Signed)
Subjective:    Patient ID: Greg Robertson, male    DOB: June 30, 1950, 68 y.o.   MRN: [id number]  HPI Pt here for follow up and management of chronic medical problems which includes diabetes and hypertension. He is taking medication regularly.  Greg Robertson is doing well today with no specific complaints.  He is a very brittle diabetic.  He has had colon cancer.  He needs no refills today.  The patient has aortic atherosclerosis.  He did see the cardiologist in November of this past year.  He has Lynch syndrome and is followed regularly by the gastroenterologist, Dr. Ardis Hughs.  He is also followed by the oncologist, Dr. Benay Spice.  According to the oncologist he remains in remission.  The patient did have an endoscopy and had some mild gastritis and one duodenal polyp.  At the same time he also had a colonoscopy which will be repeated in 1 year because of his history of Lynch syndrome.  The entire colon was normal.  The patient looks good and is feeling well and is very thankful for the good physicians in [place] that of help manage his Lynch syndrome issues and for having good reports recently and plans to follow-up with them regularly.  Today he denies any chest pain pressure tightness or shortness of breath.  He denies any trouble with swallowing heartburn indigestion nausea vomiting diarrhea blood in the stool black tarry bowel movements or change in bowel habits.  He is passing his water well.  He sees the ophthalmologist, Dr. Carolynn Sayers every 6 months.  He has seen the cardiologist this past fall and hopefully he will continue to follow-up regularly because of increased risk factors for heart disease.    Patient Active Problem List   Diagnosis Date Noted  . COPD (chronic obstructive pulmonary disease) ([place]) 09/05/2017  . Aortic atherosclerosis ([place]) 09/04/2017  . Benign prostatic hyperplasia 09/04/2017  . Thrombocytopenia ([place]) 01/22/2015  . 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 Nausea and vomiting 04/06/2013  . Diabetes 1.5, managed as type 1 ([place]) 10/29/2012  . HTN (hypertension) 03/14/2012  . Chronic diastolic heart failure, NYHA class 1 ([place]) 03/12/2012  . Demand ischemia ([place]) 03/12/2012  . Fracture of left clavicle-distal 03/12/2012  . NSTEMI (non-ST elevated myocardial infarction) ([place]) 03/11/2012  . Atrial fibrillation with RVR ([place]) 03/11/2012  . Diabetic ketoacidosis ([place]) 03/10/2012  . Dehydration, severe 03/10/2012  . Right bundle branch block 03/10/2012  . Atrial fibrillation by electrocardiogram ([place]) 03/10/2012  . Elevated troponin I level 03/10/2012  . Lynch syndrome 07/28/2011  . Malignant neoplasm of ascending colon ([place]) 05/25/2011  . Nonspecific abnormal finding in stool contents 05/10/2011  . Diabetes mellitus type 2, uncontrolled, without complications ([place]) 08/67/6195  . Glaucoma 05/10/2011   Outpatient Encounter Medications as of 05/24/2018  Medication Sig  . acetaminophen (TYLENOL) 500 MG tablet Take 1,000 mg by mouth every 6 (six) hours as needed. Pain   . Ascorbic Acid (VITAMIN C PO) Take 1 tablet by mouth every morning.   Marland Kitchen aspirin EC 81 MG tablet Take 81 mg by mouth daily.  Marland Kitchen glucose blood test strip Use as instructed  . insulin aspart (NOVOLOG) 100 UNIT/ML injection INJECT 8-15 UNITS THREE TIMES DAILY BEFORE MEALS  . insulin degludec (TRESIBA FLEXTOUCH) 100 UNIT/ML SOPN FlexTouch Pen Inject 0.16-0.2 mLs (16-20 Units total) into the skin every morning.  Marland Kitchen levocetirizine (XYZAL) 5 MG tablet Take 1 tablet (5 mg total) by mouth daily.  Marland Kitchen lisinopril (PRINIVIL,ZESTRIL)  5 MG tablet Take 1 tablet (5 mg total) by mouth daily.  . Multiple Vitamin (MULTIVITAMIN) tablet Take 1 tablet by mouth every morning.   . simvastatin (ZOCOR) 40 MG tablet Take 1 tablet (40 mg total) by mouth at bedtime.  . triamcinolone (NASACORT) 55 MCG/ACT AERO nasal inhaler INSTILL 1 SPRAY IN EACH NOSTRIL ONCE OR TWICE DAILY AS NEEDED.   No facility-administered  encounter medications on file as of 05/24/2018.      Review of Systems  Constitutional: Negative.   HENT: Negative.   Eyes: Negative.   Respiratory: Negative.   Cardiovascular: Negative.   Gastrointestinal: Negative.   Endocrine: Negative.   Genitourinary: Negative.   Musculoskeletal: Negative.   Skin: Negative.   Allergic/Immunologic: Negative.   Neurological: Negative.   Hematological: Negative.   Psychiatric/Behavioral: Negative.        Objective:   Physical Exam Vitals signs and nursing note reviewed.  Constitutional:      General: He is not in acute distress.    Appearance: Normal appearance. He is well-developed and normal weight. He is not ill-appearing.     Comments: Patient looks good is pleasant and feeling well and positive about the recent negative results from the endoscopy and colonoscopy but understands he has to have these repeated yearly.  HENT:     Head: Normocephalic and atraumatic.     Right Ear: Tympanic membrane, ear canal and external ear normal. There is impacted cerumen.     Left Ear: Tympanic membrane, ear canal and external ear normal. There is no impacted cerumen.     Nose: Nose normal. No congestion.     Mouth/Throat:     Mouth: Mucous membranes are moist.     Pharynx: Oropharynx is clear. No oropharyngeal exudate or posterior oropharyngeal erythema.  Eyes:     General: No scleral icterus.       Right eye: No discharge.        Left eye: No discharge.     Extraocular Movements: Extraocular movements intact.     Conjunctiva/sclera: Conjunctivae normal.     Pupils: Pupils are equal, round, and reactive to light.     Comments: Gets eye exam every 6 months Dr. Carolynn Sayers  Neck:     Musculoskeletal: Normal range of motion and neck supple.     Thyroid: No thyromegaly.     Vascular: No carotid bruit.     Trachea: No tracheal deviation.     Comments: No bruits thyromegaly or anterior cervical adenopathy Cardiovascular:     Rate and Rhythm: Normal rate  and regular rhythm.     Heart sounds: Normal heart sounds. No murmur. No gallop.      Comments: The heart is regular at 84/min with diminished pulses in both feet Pulmonary:     Effort: Pulmonary effort is normal. No respiratory distress.     Breath sounds: Normal breath sounds. No wheezing or rales.     Comments: Lungs are clear anteriorly and posteriorly with no axillary adenopathy chest wall masses or tenderness Chest:     Chest wall: No tenderness.  Abdominal:     General: Abdomen is flat. Bowel sounds are normal.     Palpations: Abdomen is soft. There is no mass.     Tenderness: There is no abdominal tenderness. There is no guarding.     Comments: No abdominal tenderness masses organ enlargement bruits or inguinal adenopathy with good inguinal pulses.  Musculoskeletal: Normal range of motion.  General: No tenderness.     Right lower leg: No edema.     Left lower leg: No edema.  Lymphadenopathy:     Cervical: No cervical adenopathy.  Skin:    General: Skin is warm and dry.     Findings: No rash.  Neurological:     General: No focal deficit present.     Mental Status: He is alert and oriented to person, place, and time. Mental status is at baseline.     Cranial Nerves: No cranial nerve deficit.     Sensory: No sensory deficit.     Motor: No weakness.     Deep Tendon Reflexes: Reflexes are normal and symmetric. Reflexes normal.  Psychiatric:        Mood and Affect: Mood normal.        Behavior: Behavior normal.        Thought Content: Thought content normal.        Judgment: Judgment normal.     BP 140/78 (BP Location: Left Arm)   Pulse 87   Temp (!) 97.2 F (36.2 C) (Oral)   Ht _0  (1.905 m)   Wt 171 lb (77.6 kg)   BMI 21.37 kg/m        Assessment & Plan:  1. Type 1 diabetes mellitus with hyperglycemia (HCC) -Continue with aggressive diabetic management including exercise and diet and monitoring sugars closely - CBC with Differential/Platelet - Bayer  DCA Hb A1c Waived  2. 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 Essential hypertension -Repeat blood pressure was better at 135/70 and patient will continue with current treatment and sodium restriction - BMP8+EGFR - CBC with Differential/Platelet - Hepatic function panel  4. Vitamin D deficiency -Continue with vitamin D replacement pending results of lab work - CBC with Differential/Platelet - VITAMIN D 25 Hydroxy (Vit-D Deficiency, Fractures)  5. Aortic atherosclerosis ([place]) -Continue with aggressive therapeutic lifestyle changes statin therapy - CBC with Differential/Platelet - Lipid panel  6. Lynch syndrome -Follow-up regularly with oncology and gastroenterology as planned  7. Peripheral vascular insufficiency (HCC) -Walk regularly and if any symptoms of claudication developed get back in touch with [place] or let the cardiologist know this.  8. Thrombocytopenia ([place]) -No issues today with increased bruising or bleeding.  9. Paroxysmal atrial fibrillation (HCC) -Continue to follow-up with cardiology as recommended  Patient Instructions                       Medicare Annual Wellness Visit  [facility] and the medical providers at [facility] strive to bring you the best medical care.  In doing so we not only want to address your current medical conditions and concerns but also to detect new conditions early and prevent illness, disease and health-related problems.    Medicare offers a yearly Wellness Visit which allows our clinical staff to assess your need for preventative services including immunizations, lifestyle education, counseling to decrease risk of preventable diseases and screening for fall risk and other medical concerns.    This visit is provided free of charge (no copay) for all  Medicare recipients. The clinical pharmacists at [facility] have begun to conduct these Wellness Visits which will also include a thorough review of all your medications.    As you primary medical provider recommend that you make an  appointment for your Annual Wellness Visit if you have not done so already this year.  You may set up this appointment before you leave today or you may call back ([phone]) and schedule an appointment.  Please make sure when you call that you mention that you are scheduling your Annual Wellness Visit with the clinical pharmacist so that the appointment may be made for the proper length of time.     Continue current medications. Continue good therapeutic lifestyle changes which include good diet and exercise. Fall precautions discussed with patient. If an FOBT was given today- please return it to our front desk. If you are over 74 years old - you may need Prevnar 65 or the adult Pneumonia vaccine.  **Flu shots are available--- please call and schedule a FLU-CLINIC appointment**  After your visit with [place] today you will receive a survey in the mail or online from [employer] regarding your care with [place]. Please take a moment to fill this out. Your feedback is very important to [place] as you can help [place] better understand your patient needs as well as improve your experience and satisfaction. WE CARE ABOUT YOU!!!   Continue to follow-up with oncology and gastroenterology as planned Continue to monitor blood sugars closely and especially when any infections might develop causing the sugar to be out of line more. Always drink plenty of fluids and stay well-hydrated For the rest of the winter months, use good hand and respiratory hygiene. Regular follow-up with cardiology because of increased risk factors  Arrie Senate MD

## 2018-05-25 LAB — CBC WITH DIFFERENTIAL/PLATELET
Basophils Absolute: 0 10*3/uL (ref 0.0–0.2)
Basos: 0 %
EOS (ABSOLUTE): 0.1 10*3/uL (ref 0.0–0.4)
Eos: 2 %
HEMATOCRIT: 43.9 % (ref 37.5–51.0)
Hemoglobin: 14.6 g/dL (ref 13.0–17.7)
Immature Grans (Abs): 0 10*3/uL (ref 0.0–0.1)
Immature Granulocytes: 0 %
Lymphocytes Absolute: 1.9 10*3/uL (ref 0.7–3.1)
Lymphs: 31 %
MCH: 31 pg (ref 26.6–33.0)
MCHC: 33.3 g/dL (ref 31.5–35.7)
MCV: 93 fL (ref 79–97)
Monocytes Absolute: 0.6 10*3/uL (ref 0.1–0.9)
Monocytes: 11 %
Neutrophils Absolute: 3.3 10*3/uL (ref 1.4–7.0)
Neutrophils: 56 %
Platelets: 167 10*3/uL (ref 150–450)
RBC: 4.71 x10E6/uL (ref 4.14–5.80)
RDW: 13.5 % (ref 11.6–15.4)
WBC: 5.9 10*3/uL (ref 3.4–10.8)

## 2018-05-25 LAB — BMP8+EGFR
BUN/Creatinine Ratio: 18 (ref 10–24)
BUN: 18 mg/dL (ref 8–27)
CO2: 25 mmol/L (ref 20–29)
CREATININE: 1.01 mg/dL (ref 0.76–1.27)
Calcium: 9.3 mg/dL (ref 8.6–10.2)
Chloride: 101 mmol/L (ref 96–106)
GFR calc Af Amer: 89 mL/min/{1.73_m2} (ref >59)
GFR calc non Af Amer: 77 mL/min/{1.73_m2} (ref >59)
GLUCOSE: 111 mg/dL — AB (ref 65–99)
Potassium: 4 mmol/L (ref 3.5–5.2)
SODIUM: 141 mmol/L (ref 134–144)

## 2018-05-25 LAB — HEPATIC FUNCTION PANEL
ALBUMIN: 4.4 g/dL (ref 3.8–4.8)
ALT: 25 IU/L (ref 0–44)
AST: 27 IU/L (ref 0–40)
Alkaline Phosphatase: 78 IU/L (ref 39–117)
Bilirubin Total: 0.6 mg/dL (ref 0.0–1.2)
Bilirubin, Direct: 0.21 mg/dL (ref 0.00–0.40)
Total Protein: 6.1 g/dL (ref 6.0–8.5)

## 2018-05-25 LAB — LIPID PANEL
CHOL/HDL RATIO: 1.7 ratio (ref 0.0–5.0)
Cholesterol, Total: 208 mg/dL — ABNORMAL HIGH (ref 100–199)
HDL: 124 mg/dL (ref >39)
LDL Calculated: 72 mg/dL (ref 0–99)
Triglycerides: 61 mg/dL (ref 0–149)
VLDL Cholesterol Cal: 12 mg/dL (ref 5–40)

## 2018-05-25 LAB — VITAMIN D 25 HYDROXY (VIT D DEFICIENCY, FRACTURES): Vit D, 25-Hydroxy: 10.7 ng/mL — ABNORMAL LOW (ref 30.0–100.0)

## 2018-05-29 ENCOUNTER — Other Ambulatory Visit: Payer: Self-pay | Admitting: *Deleted

## 2018-05-29 MED ORDER — VITAMIN D (ERGOCALCIFEROL) 1.25 MG (50000 UNIT) PO CAPS: 50000.0000 [IU] | ORAL_CAPSULE | ORAL | 0 refills | Status: DC

## 2018-06-08 ENCOUNTER — Encounter: Payer: Self-pay | Admitting: Gastroenterology

## 2018-07-18 ENCOUNTER — Other Ambulatory Visit: Payer: Self-pay | Admitting: Family Medicine

## 2018-08-27 ENCOUNTER — Telehealth: Payer: Self-pay | Admitting: Family Medicine

## 2018-09-11 ENCOUNTER — Encounter: Payer: Medicare Other | Admitting: *Deleted

## 2018-09-14 ENCOUNTER — Other Ambulatory Visit: Payer: Self-pay | Admitting: Family Medicine

## 2018-09-27 ENCOUNTER — Other Ambulatory Visit: Payer: Self-pay

## 2018-09-27 ENCOUNTER — Ambulatory Visit (INDEPENDENT_AMBULATORY_CARE_PROVIDER_SITE_OTHER): Payer: Medicare Other | Admitting: Family Medicine

## 2018-09-27 ENCOUNTER — Encounter: Payer: Self-pay | Admitting: Family Medicine

## 2018-09-27 DIAGNOSIS — I10 Essential (primary) hypertension: Secondary | ICD-10-CM

## 2018-09-27 DIAGNOSIS — J410 Simple chronic bronchitis: Secondary | ICD-10-CM

## 2018-09-27 DIAGNOSIS — I700 Atherosclerosis of aorta: Secondary | ICD-10-CM

## 2018-09-27 DIAGNOSIS — E1069 Type 1 diabetes mellitus with other specified complication: Secondary | ICD-10-CM | POA: Diagnosis not present

## 2018-09-27 DIAGNOSIS — D696 Thrombocytopenia, unspecified: Secondary | ICD-10-CM

## 2018-09-27 DIAGNOSIS — IMO0001 Reserved for inherently not codable concepts without codable children: Secondary | ICD-10-CM

## 2018-09-27 DIAGNOSIS — E1165 Type 2 diabetes mellitus with hyperglycemia: Secondary | ICD-10-CM | POA: Diagnosis not present

## 2018-09-27 DIAGNOSIS — E559 Vitamin D deficiency, unspecified: Secondary | ICD-10-CM | POA: Diagnosis not present

## 2018-09-27 DIAGNOSIS — C182 Malignant neoplasm of ascending colon: Secondary | ICD-10-CM

## 2018-09-27 DIAGNOSIS — Z1509 Genetic susceptibility to other malignant neoplasm: Secondary | ICD-10-CM

## 2018-09-27 DIAGNOSIS — E785 Hyperlipidemia, unspecified: Secondary | ICD-10-CM | POA: Diagnosis not present

## 2018-09-27 DIAGNOSIS — N400 Enlarged prostate without lower urinary tract symptoms: Secondary | ICD-10-CM | POA: Diagnosis not present

## 2018-09-27 DIAGNOSIS — Z0000 Encounter for general adult medical examination without abnormal findings: Secondary | ICD-10-CM

## 2018-09-27 NOTE — Progress Notes (Signed)
Virtual Visit Via telephone Note I connected with@ on 09/27/18 by telephone and verified that I am speaking with the correct person or authorized healthcare agent using two identifiers. Greg Robertson is currently located at home and there are no unauthorized people in close proximity. I completed this visit while in a private location in my home .  This visit type was conducted due to national recommendations for restrictions regarding the COVID-19 Pandemic (e.g. social distancing).  This format is felt to be most appropriate for this patient at this time.  All issues noted in this document were discussed and addressed.  No physical exam was performed.    I discussed the limitations, risks, security and privacy concerns of performing an evaluation and management service by telephone and the availability of in person appointments. I also discussed with the patient that there may be a patient responsible charge related to this service. The patient expressed understanding and agreed to proceed.   Date:  09/27/2018    ID:  Greg Robertson      1950-05-15        [id number]   Patient Care Team Patient Care Team: Chipper Herb, MD as PCP - General (Family Medicine) Inda Castle, MD (Inactive) as Consulting Physician (Gastroenterology)  Reason for Visit: Primary Care Follow-up     History of Present Illness & Review of Systems:     Greg Robertson is a 68 y.o. year old male primary care patient that presents today for a telehealth visit.  The patient is alert and overall feeling quite well.  He is practicing the [place] restrictions as much as possible since he lives by himself and does have chronic medical conditions.  The patient has had colon cancer.  He gets colonoscopies yearly and another and will come up this fall with Dr. Ardis Hughs.  Today he denies any chest pain pressure tightness or shortness of breath.  He denies any trouble with his stomach including swallowing  heartburn indigestion nausea vomiting diarrhea or blood in the stool.  He has occasional nocturia.  It sounds like he is due to get an eye exam he is behind on this and normally sees Dr. Katy Fitch or 1 of his sons for this exam.  He does have glaucoma.  Dr. Ardis Hughs will be repeating his colonoscopy and endoscopy this fall.  He will have a return visit which is a yearly visit with Dr. Percival Spanish also in September.  He has had a NSTEMI in the past.  Review of systems as stated, otherwise negative.  The patient does not have symptoms concerning for COVID-19 infection (fever, chills, cough, or new shortness of breath).      Current Medications (Verified) Allergies as of 09/27/2018      Reactions   Oxycodone Nausea And Vomiting      Medication List       Accurate as of September 27, 2018 11:00 AM. If you have any questions, ask your nurse or doctor.        acetaminophen 500 MG tablet Commonly known as: TYLENOL Take 1,000 mg by mouth every 6 (six) hours as needed. Pain   aspirin EC 81 MG tablet Take 81 mg by mouth daily.   glucose blood test strip Use as instructed   insulin aspart 100 UNIT/ML injection Commonly known as: NovoLOG INJECT 8-15 UNITS THREE TIMES DAILY BEFORE MEALS   insulin degludec 100 UNIT/ML Sopn FlexTouch Pen Commonly known as: Tyler Aas FlexTouch Inject 0.16-0.2 mLs (  16-20 Units total) into the skin every morning.   levocetirizine 5 MG tablet Commonly known as: XYZAL Take 1 tablet (5 mg total) by mouth daily.   lisinopril 5 MG tablet Commonly known as: ZESTRIL Take 1 tablet (5 mg total) by mouth daily.   multivitamin tablet Take 1 tablet by mouth every morning.   simvastatin 40 MG tablet Commonly known as: ZOCOR TAKE 1 TABLET BY MOUTH EVERYDAY AT BEDTIME   triamcinolone 55 MCG/ACT Aero nasal inhaler Commonly known as: NASACORT INSTILL 1 SPRAY IN EACH NOSTRIL ONCE OR TWICE DAILY AS NEEDED.   VITAMIN C PO Take 1 tablet by mouth every morning.   Vitamin D  (Ergocalciferol) 1.25 MG (50000 UT) Caps capsule Commonly known as: DRISDOL Take 1 capsule (50,000 Units total) by mouth every 7 (seven) days.           Allergies (Verified)    Oxycodone  Past Medical History Past Medical History:  Diagnosis Date   Allergic rhinitis    Anemia    Atrial fibrillation ([place])    Persistent   Cancer of ascending colon, 7cm 05/25/2011   Diabetes mellitus type I ([place])    Glaucoma    Hypertension    Pneumonia 1979 or 1980   Prostate nodule    Substance abuse [facility])      Past Surgical History:  Procedure Laterality Date   broken left shoulder blade and collar bone     FINGER SURGERY  2009   right middle   LAPAROSCOPIC ASSISTED 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     NASAL FRACTURE SURGERY  1968   PORTACATH PLACEMENT  07/01/2011   Procedure: INSERTION PORT-A-CATH;  Surgeon: Adin Hector, MD;  Location: [facility];  Service: General;  Laterality: Left;  Insertion of Port-A-Catheter Left Internal Jugular   TONSILLECTOMY  1957 - approximate    Social History   Socioeconomic History   Marital status: Single    Spouse name: Not on file   Number of children: 0   Years of education: 16   Highest education level: Bachelor's degree (e.g., BA, AB, BS)  Occupational History   Occupation: Retired    Comment: Environmental consultant strain: Not hard at all   Food insecurity    Worry: Never true    Inability: Never true   Transportation needs    Medical: No    Non-medical: No  Tobacco Use   Smoking status: Former Smoker    Packs/day: 1.00    Years: 15.00    Pack years: 15.00    Types: Cigarettes    Quit date: 04/18/1993    Years since quitting: 25.4   Smokeless tobacco: Never Used   Tobacco comment: marijuana every night   Substance and Sexual Activity   Alcohol use: Yes    Alcohol/week: 5.0 standard drinks    Types: 3 Cans of beer, 2 Shots of liquor per week    Comment: 1 drink every 2 days    Drug use: Yes    Types: Marijuana    Comment: once a night marijuana   Sexual activity: Not Currently  Lifestyle   Physical activity    Days per week: 5 days    Minutes per session: 40 min   Stress: Only a little  Relationships   Social connections    Talks on phone: More than three times a week    Gets together: More than three times a week    Attends religious service: Never  Active member of club or organization: No    Attends meetings of clubs or organizations: Never    Relationship status: Never married  Other Topics Concern   Not on file  Social History Narrative   Lives alone.  Retired from [employer].      Family History  Problem Relation Age of Onset   Colon polyps Father    Lung cancer Father    Diabetes Father    Prostate cancer Father    Breast cancer Mother    Pancreatitis Mother        intestinal adhesions   Insomnia Mother    Colon cancer Neg Hx    Rectal cancer Neg Hx       Labs/Other Tests and Data Reviewed:    Wt Readings from Last 3 Encounters:  05/24/18 171 lb (77.6 kg)  04/05/18 170 lb 14.4 oz (77.5 kg)  02/28/18 173 lb (78.5 kg)   Temp Readings from Last 3 Encounters:  05/24/18 (!) 97.2 F (36.2 C) (Oral)  04/05/18 98 F (36.7 C) (Oral)  01/17/18 98.1 F (36.7 C) (Oral)   BP Readings from Last 3 Encounters:  05/24/18 135/70  04/05/18 (!) 171/80  02/28/18 (!) 174/88   Pulse Readings from Last 3 Encounters:  05/24/18 87  04/05/18 88  02/28/18 69     Lab Results  Component Value Date   HGBA1C 8.3 (H) 05/24/2018   HGBA1C 8.9 (H) 01/17/2018   HGBA1C 9.2 (H) 09/13/2017   Lab Results  Component Value Date   MICROALBUR neg 01/21/2015   LDLCALC 72 05/24/2018   CREATININE 1.01 05/24/2018       Chemistry      Component Value Date/Time   NA 141 05/24/2018 1050   NA 138 07/21/2014 1434   K 4.0 05/24/2018 1050   K 4.2 07/21/2014 1434   CL 101 05/24/2018 1050   CL 104 04/06/2012 0808   CO2 25  05/24/2018 1050   CO2 27 07/21/2014 1434   BUN 18 05/24/2018 1050   BUN 12.0 07/21/2014 1434   CREATININE 1.01 05/24/2018 1050   CREATININE 1.2 07/21/2014 1434      Component Value Date/Time   CALCIUM 9.3 05/24/2018 1050   CALCIUM 9.3 07/21/2014 1434   ALKPHOS 78 05/24/2018 1050   ALKPHOS 146 04/06/2012 0808   AST 27 05/24/2018 1050   AST 32 04/06/2012 0808   ALT 25 05/24/2018 1050   ALT 27 04/06/2012 0808   BILITOT 0.6 05/24/2018 1050   BILITOT 0.47 04/06/2012 0808         OBSERVATIONS/ OBJECTIVE:     The patient is alert and appears to be managing his blood sugar well with his treatment regimen.  He has currently adjusted his Tresiba up to 17 units.  He still has some slight low blood sugars but if he takes any more than this he has a lot more low blood sugars.  He says that his average blood sugar at home is running 150.  He checks his feet regularly and there is no sign of any infection or sores or redness.  His weight is about 172 pounds and he is 6 foot 3 inches tall.  Physical exam deferred due to nature of telephonic visit.  ASSESSMENT & PLAN    Time:   Today, I have spent 27 minutes with the patient via telephone discussing the above including Covid precautions.     Visit Diagnoses: 1. Essential hypertension -Blood pressures regularly and watch sodium intake  2.  Aortic atherosclerosis ([place]) -Continue with healthy eating habits and aggressive therapeutic lifestyle changes.  Continue with simvastatin.  3. Simple chronic bronchitis (HCC) -Avoid irritating environments and use inhaler as needed  4. Malignant neoplasm of ascending colon [facility]) -Follow-up with hematology and oncology as planned  5. Diabetes mellitus type 2, uncontrolled, without complications (HCC) -Maintain good blood sugar control by regular checks of blood sugars and insulin adjustments  6. 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  7. Benign prostatic hyperplasia without lower urinary tract symptoms -Patient will need rectal exam at next visit with PCP  8. Thrombocytopenia ([place]) -The patient did not tolerate taking baby aspirins.  He is currently not taking any kind of blood thinner.  He had increased bleeding and bruising with this.  9. Lynch syndrome -Repeat colonoscopy this fall as planned by his gastroenterologist Dr. Oretha Caprice  10.,  Glaucoma -The patient was reminded he needs to follow-up with his ophthalmologist on a yearly basis  Patient Instructions  Patient should keep follow-up appointments with Dr. Ardis Hughs, Dr. Katy Fitch, Dr. Percival Spanish, and Dr. Benay Spice, his oncologist He should continue to practice good hand and respiratory hygiene He should check his feet regularly He should check his sugars regularly He should continue to drink plenty of fluids and stay well-hydrated He will need to have a repeat colonoscopy and endoscopy in October of this year His cardiac visit should be sometime in September as a follow-up. He should see the ophthalmologist yearly and I reminded him of this and he should call and set up an appointment because he does have glaucoma.  He should call my nurse and set up a good and safe time to come to the office for his lab work to be done.      The above assessment and management plan was discussed with the patient. The patient verbalized understanding of and has agreed to the management plan. Patient is aware to call the clinic if symptoms persist or worsen. Patient is aware when to return to the clinic for a follow-up visit. Patient educated on when it is appropriate to go to the emergency department.    Chipper Herb, MD [place], [place], [place] [address] Ph [phone]   Arrie Senate MD

## 2018-09-27 NOTE — Patient Instructions (Signed)
Patient should keep follow-up appointments with Dr. Ardis Hughs, Dr. Katy Fitch, Dr. Percival Spanish, and Dr. Benay Spice, his oncologist He should continue to practice good hand and respiratory hygiene He should check his feet regularly He should check his sugars regularly He should continue to drink plenty of fluids and stay well-hydrated He will need to have a repeat colonoscopy and endoscopy in October of this year His cardiac visit should be sometime in September as a follow-up. He should see the ophthalmologist yearly and I reminded him of this and he should call and set up an appointment because he does have glaucoma.  He should call my nurse and set up a good and safe time to come to the office for his lab work to be done.

## 2018-09-27 NOTE — Addendum Note (Signed)
Addended by: Zannie Cove on: 09/27/2018 01:39 PM   Modules accepted: Orders

## 2018-10-02 ENCOUNTER — Encounter: Payer: Medicare Other | Admitting: *Deleted

## 2018-10-02 ENCOUNTER — Ambulatory Visit (INDEPENDENT_AMBULATORY_CARE_PROVIDER_SITE_OTHER): Payer: Medicare Other | Admitting: *Deleted

## 2018-10-02 VITALS — Ht 75.0 in | Wt 171.0 lb

## 2018-10-02 DIAGNOSIS — Z0000 Encounter for general adult medical examination without abnormal findings: Secondary | ICD-10-CM | POA: Diagnosis not present

## 2018-10-02 NOTE — Progress Notes (Signed)
MEDICARE ANNUAL WELLNESS VISIT  10/02/2018  Telephone Visit Disclaimer This Medicare AWV was conducted by telephone due to national recommendations for restrictions regarding the COVID-19 Pandemic (e.g. social distancing).  I verified, using two identifiers, that I am speaking with Greg Robertson or their authorized healthcare agent. I discussed the limitations, risks, security, and privacy concerns of performing an evaluation and management service by telephone and the potential availability of an in-person appointment in the future. The patient expressed understanding and agreed to proceed.   Subjective:  Greg Robertson is a 68 y.o. male patient of Dettinger, Fransisca Kaufmann, MD who had a Medicare Annual Wellness Visit today via telephone. Greg Robertson is Retired and lives alone. he has 0 children. he reports that he is socially active and does interact with friends/family regularly. he is minimally physically active and enjoys reading.  Patient Care Team: Dettinger, Fransisca Kaufmann, MD as PCP - General (Family Medicine) Inda Castle, MD (Inactive) as Consulting Physician (Gastroenterology) Ladell Pier, MD as Consulting Physician (Oncology) Minus Breeding, MD as Consulting Physician (Cardiology) [facility] Milus Banister, MD as Attending Physician (Gastroenterology)  Advanced Directives 10/02/2018 09/05/2017 07/09/2015 01/09/2015 08/27/2014 04/06/2013 03/11/2012  Does Patient Have a Medical Advance Directive? No Yes No No Yes Patient has advance directive, copy in chart Patient does not have advance directive  Type of Advance Directive - [place];Living will - - [place];Living will Living will -  Does patient want to make changes to medical advance directive? - No - Patient declined - - No - Patient declined No -  Copy of [place] in Chart? - No - copy requested - - No - copy requested - -  Would patient like  information on creating a medical advance directive? Yes (MAU/Ambulatory/Procedural Areas - Information given) - No - patient declined information No - patient declined information - - -  Pre-existing out of facility DNR order (yellow form or pink MOST form) - - - - - - No    Hospital Utilization Over the Past 12 Months: # of hospitalizations or ER visits: 0 # of surgeries: 0  Review of Systems    Patient reports that his overall health is unchanged compared to last year.  Patient Reported Readings (BP, Pulse, CBG, Weight, etc) CBG 125  Review of Systems: History obtained from chart review and the patient General ROS: negative  All other systems negative.  Pain Assessment Pain : No/denies pain     Current Medications & Allergies (verified) Allergies as of 10/02/2018      Reactions   Oxycodone Nausea And Vomiting      Medication List       Accurate as of October 02, 2018  3:00 PM. If you have any questions, ask your nurse or doctor.        acetaminophen 500 MG tablet Commonly known as: TYLENOL Take 1,000 mg by mouth every 6 (six) hours as needed. Pain   aspirin EC 81 MG tablet Take 81 mg by mouth daily.   glucose blood test strip Use as instructed   insulin aspart 100 UNIT/ML injection Commonly known as: NovoLOG INJECT 8-15 UNITS THREE TIMES DAILY BEFORE MEALS   insulin degludec 100 UNIT/ML Sopn FlexTouch Pen Commonly known as: Tyler Aas FlexTouch Inject 0.16-0.2 mLs (16-20 Units total) into the skin every morning.   levocetirizine 5 MG tablet Commonly known as: XYZAL Take 1 tablet (5 mg total) by mouth daily.  lisinopril 5 MG tablet Commonly known as: ZESTRIL Take 1 tablet (5 mg total) by mouth daily.   multivitamin tablet Take 1 tablet by mouth every morning.   simvastatin 40 MG tablet Commonly known as: ZOCOR TAKE 1 TABLET BY MOUTH EVERYDAY AT BEDTIME   triamcinolone 55 MCG/ACT Aero nasal inhaler Commonly known as: NASACORT INSTILL 1 SPRAY IN EACH  NOSTRIL ONCE OR TWICE DAILY AS NEEDED.   VITAMIN C PO Take 1 tablet by mouth every morning.   Vitamin D (Ergocalciferol) 1.25 MG (50000 UT) Caps capsule Commonly known as: DRISDOL Take 1 capsule (50,000 Units total) by mouth every 7 (seven) days.       History (reviewed): Past Medical History:  Diagnosis Date  . Allergic rhinitis   . Anemia   . Atrial fibrillation (HCC)    Persistent  . Cancer of ascending colon, 7cm 05/25/2011  . Diabetes mellitus type I ([place])   . Glaucoma   . Hypertension   . Pneumonia 1979 or 1980  . Prostate nodule   . Substance abuse [facility])    Past Surgical History:  Procedure Laterality Date  . broken left shoulder blade and collar bone    . FINGER SURGERY  2009   right middle  . LAPAROSCOPIC ASSISTED 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    . NASAL FRACTURE SURGERY  1968  . PORTACATH PLACEMENT  07/01/2011   Procedure: INSERTION PORT-A-CATH;  Surgeon: Adin Hector, MD;  Location: [facility] ORS;  Service: General;  Laterality: Left;  Insertion of Port-A-Catheter Left Internal Jugular  . TONSILLECTOMY  1957 - approximate   Family History  Problem Relation Age of Onset  . Colon polyps Father   . Lung cancer Father   . Diabetes Father   . Prostate cancer Father   . Breast cancer Mother   . Pancreatitis Mother        intestinal adhesions  . Insomnia Mother   . Colon cancer Neg Hx   . Rectal cancer Neg Hx    Social History   Socioeconomic History  . Marital status: Single    Spouse name: Not on file  . Number of children: 0  . Years of education: 16  . Highest education level: Bachelor's degree (e.g., BA, AB, BS)  Occupational History  . Occupation: Retired    Comment: Geographical information systems officer  . Financial resource strain: Not hard at all  . Food insecurity    Worry: Never true    Inability: Never true  . Transportation needs    Medical: No    Non-medical: No  Tobacco Use  . Smoking status: Former Smoker    Packs/day: 1.00    Years:  15.00    Pack years: 15.00    Types: Cigarettes    Quit date: 04/18/1993    Years since quitting: 25.4  . Smokeless tobacco: Never Used  . Tobacco comment: marijuana every night   Substance and Sexual Activity  . Alcohol use: Yes    Alcohol/week: 5.0 standard drinks    Types: 3 Cans of beer, 2 Shots of liquor per week    Comment: 1 drink every 2 days  . Drug use: Yes    Types: Marijuana    Comment: once a night marijuana  . Sexual activity: Not Currently  Lifestyle  . Physical activity    Days per week: 5 days    Minutes per session: 40 min  . Stress: Only a little  Relationships  . Social connections  Talks on phone: More than three times a week    Gets together: More than three times a week    Attends religious service: Never    Active member of club or organization: No    Attends meetings of clubs or organizations: Never    Relationship status: Never married  Other Topics Concern  . Not on file  Social History Narrative   Lives alone.  Retired from [employer].     Activities of Daily Living In your present state of health, do you have any difficulty performing the following activities: 10/02/2018  Hearing? N  Vision? N  Difficulty concentrating or making decisions? N  Walking or climbing stairs? N  Dressing or bathing? N  Doing errands, shopping? N  Preparing Food and eating ? N  Using the Toilet? N  In the past six months, have you accidently leaked urine? N  Do you have problems with loss of bowel control? N  Managing your Medications? N  Managing your Finances? N  Housekeeping or managing your Housekeeping? N  Some recent data might be hidden    Patient Literacy How often do you need to have someone help you when you read instructions, pamphlets, or other written materials from your doctor or pharmacy?: 1 - Never What is the last grade level you completed in school?: Bachelor's Degree  Exercise Current Exercise Habits: Home exercise routine, Type of  exercise: walking, Time (Minutes): 40, Frequency (Times/Week): 5, Weekly Exercise (Minutes/Week): 200, Intensity: Mild  Diet Patient reports consuming 3 meals a day and 1 snack(s) a day Patient reports that his primary diet is: Regular Patient reports that she does have regular access to food.   Depression Screen PHQ 2/9 Scores 10/02/2018 05/24/2018 01/17/2018 09/04/2017 01/11/2017 01/04/2017 05/27/2016  PHQ - 2 Score 0 0 0 0 0 0 0     Fall Risk Fall Risk  10/02/2018 05/24/2018 01/17/2018 09/04/2017 01/04/2017  Falls in the past year? 0 0 No No No     Objective:  Greg Robertson seemed alert and oriented and he participated appropriately during our telephone visit.  Blood Pressure Weight BMI  BP Readings from Last 3 Encounters:  05/24/18 135/70  04/05/18 (!) 171/80  02/28/18 (!) 174/88   Wt Readings from Last 3 Encounters:  10/02/18 171 lb (77.6 kg)  05/24/18 171 lb (77.6 kg)  04/05/18 170 lb 14.4 oz (77.5 kg)   BMI Readings from Last 1 Encounters:  10/02/18 21.37 kg/m    *Unable to obtain current vital signs, weight, and BMI due to telephone visit type  Hearing/Vision  . Greg Robertson did not seem to have difficulty with hearing/understanding during the telephone conversation . Reports that he has not had a formal eye exam by an eye care professional within the past year . Reports that he has not had a formal hearing evaluation within the past year *Unable to fully assess hearing and vision during telephone visit type  Cognitive Function: 6CIT Screen 10/02/2018  What Year? 0 points  What month? 0 points  What time? 0 points  Count back from 20 0 points  Months in reverse 0 points  Repeat phrase 0 points  Total Score 0   (Normal:0-7, Significant for Dysfunction: >8)  Normal Cognitive Function Screening: Yes   Immunization & Health Maintenance Record Immunization History  Administered Date(s) Administered  . Influenza Split 06/04/2011  . Influenza, High Dose Seasonal PF  01/17/2018  . Influenza,inj,Quad PF,6+ Mos 04/08/2013, 03/06/2014, 01/21/2015, 02/03/2016  . Pneumococcal Conjugate-13  01/21/2015  . Pneumococcal Polysaccharide-23 06/04/2011  . Tdap 07/23/2015    Health Maintenance  Topic Date Due  . Hepatitis C Screening  1950/10/24  . PNA vac Low Risk Adult (2 of 2 - PPSV23) 06/03/2016  . OPHTHALMOLOGY EXAM  08/18/2016  . INFLUENZA VACCINE  11/17/2018  . HEMOGLOBIN A1C  11/22/2018  . COLONOSCOPY  01/17/2019  . FOOT EXAM  05/25/2019  . TETANUS/TDAP  07/22/2025       Assessment  This is a routine wellness examination for Greg Robertson.  Health Maintenance: Due or Overdue Health Maintenance Due  Topic Date Due  . Hepatitis C Screening  11-Feb-1951  . PNA vac Low Risk Adult (2 of 2 - PPSV23) 06/03/2016  . OPHTHALMOLOGY EXAM  08/18/2016    Greg Robertson does not need a referral for Community Assistance: Care Management:   no Social Work:    no Prescription Assistance:  no Nutrition/Diabetes Education:  no   Plan:  Personalized Goals Goals Addressed   None    Personalized Health Maintenance & Screening Recommendations  Pneumococcal vaccine  Glaucoma screening  Lung Cancer Screening Recommended: no (Low Dose CT Chest recommended if Age 21-80 years, 30 pack-year currently smoking OR have quit w/in past 15 years) Hepatitis C Screening recommended: no HIV Screening recommended: no  Advanced Directives: Written information was prepared per patient's request.  Referrals & Orders No orders of the defined types were placed in this encounter.   Follow-up Plan . Follow-up with Dettinger, Fransisca Kaufmann, MD as planned   I have personally reviewed and noted the following in the patient's chart:   . Medical and social history . Use of alcohol, tobacco or illicit drugs  . Current medications and supplements . Functional ability and status . Nutritional status . Physical activity . Advanced directives . List of other physicians  . Hospitalizations, surgeries, and ER visits in previous 12 months . Vitals . Screenings to include cognitive, depression, and falls . Referrals and appointments  In addition, I have reviewed and discussed with Greg Robertson certain preventive protocols, quality metrics, and best practice recommendations. A written personalized care plan for preventive services as well as general preventive health recommendations is available and can be mailed to the patient at his request.      Wardell Heath, LPN  [id number]

## 2018-10-02 NOTE — Patient Instructions (Signed)
[name] , Thank you for taking time to come for your Medicare Wellness Visit. I appreciate your ongoing commitment to your health goals. Please review the following plan we discussed and let me know if I can assist you in the future.   These are the goals we discussed: Goals    . Exercise 150 min/wk Moderate Activity       This is a list of the screening recommended for you and due dates:  Health Maintenance  Topic Date Due  .  Hepatitis C: One time screening is recommended by Center for Disease Control  (CDC) for  adults born from [birth year] through 1965.   16-Sep-1950  . Pneumonia vaccines (2 of 2 - PPSV23) 06/03/2016  . Eye exam for diabetics  08/18/2016  . Flu Shot  11/17/2018  . Hemoglobin A1C  11/22/2018  . Colon Cancer Screening  01/17/2019  . Complete foot exam   05/25/2019  . Tetanus Vaccine  07/22/2025     Advance Directive  Advance directives are legal documents that let you make choices ahead of time about your health care and medical treatment in case you become unable to communicate for yourself. Advance directives are a way for you to communicate your wishes to family, friends, and health care providers. This can help convey your decisions about end-of-life care if you become unable to communicate. Discussing and writing advance directives should happen over time rather than all at once. Advance directives can be changed depending on your situation and what you want, even after you have signed the advance directives. If you do not have an advance directive, some states assign family decision makers to act on your behalf based on how closely you are related to them. Each state has its own laws regarding advance directives. You may want to check with your health care provider, attorney, or state representative about the laws in your state. There are different types of advance directives, such as:  Medical power of attorney.  Living will.  Do not resuscitate (DNR) or do  not attempt resuscitation (DNAR) order. Health care proxy and medical power of attorney A health care proxy, also called a health care agent, is a person who is appointed to make medical decisions for you in cases in which you are unable to make the decisions yourself. Generally, people choose someone they know well and trust to represent their preferences. Make sure to ask this person for an agreement to act as your proxy. A proxy may have to exercise judgment in the event of a medical decision for which your wishes are not known. A medical power of attorney is a legal document that names your health care proxy. Depending on the laws in your state, after the document is written, it may also need to be:  Signed.  Notarized.  Dated.  Copied.  Witnessed.  Incorporated into your medical record. You may also want to appoint someone to manage your financial affairs in a situation in which you are unable to do so. This is called a durable power of attorney for finances. It is a separate legal document from the durable power of attorney for health care. You may choose the same person or someone different from your health care proxy to act as your agent in financial matters. If you do not appoint a proxy, or if there is a concern that the proxy is not acting in your best interests, a court-appointed guardian may be designated to act on your behalf.  Living will A living will is a set of instructions documenting your wishes about medical care when you cannot express them yourself. Health care providers should keep a copy of your living will in your medical record. You may want to give a copy to family members or friends. To alert caregivers in case of an emergency, you can place a card in your wallet to let them know that you have a living will and where they can find it. A living will is used if you become:  Terminally ill.  Incapacitated.  Unable to communicate or make decisions. Items to consider  in your living will include:  The use or non-use of life-sustaining equipment, such as dialysis machines and breathing machines (ventilators).  A DNR or DNAR order, which is the instruction not to use cardiopulmonary resuscitation (CPR) if breathing or heartbeat stops.  The use or non-use of tube feeding.  Withholding of food and fluids.  Comfort (palliative) care when the goal becomes comfort rather than a cure.  Organ and tissue donation. A living will does not give instructions for distributing your money and property if you should pass away. It is recommended that you seek the advice of a lawyer when writing a will. Decisions about taxes, beneficiaries, and asset distribution will be legally binding. This process can relieve your family and friends of any concerns surrounding disputes or questions that may come up about the distribution of your assets. DNR or DNAR A DNR or DNAR order is a request not to have CPR in the event that your heart stops beating or you stop breathing. If a DNR or DNAR order has not been made and shared, a health care provider will try to help any patient whose heart has stopped or who has stopped breathing. If you plan to have surgery, talk with your health care provider about how your DNR or DNAR order will be followed if problems occur. Summary  Advance directives are the legal documents that allow you to make choices ahead of time about your health care and medical treatment in case you become unable to communicate for yourself.  The process of discussing and writing advance directives should happen over time. You can change the advance directives, even after you have signed them.  Advance directives include DNR or DNAR orders, living wills, and designating an agent as your medical power of attorney. This information is not intended to replace advice given to you by your health care provider. Make sure you discuss any questions you have with your health care  provider. Document Released: 07/12/2007 Document Revised: 02/22/2016 Document Reviewed: 02/22/2016 Elsevier Interactive Patient Education  2019 [employer].

## 2018-10-08 ENCOUNTER — Other Ambulatory Visit: Payer: Self-pay

## 2018-10-08 ENCOUNTER — Inpatient Hospital Stay (HOSPITAL_BASED_OUTPATIENT_CLINIC_OR_DEPARTMENT_OTHER): Payer: Medicare Other | Admitting: Oncology

## 2018-10-08 ENCOUNTER — Inpatient Hospital Stay: Payer: Medicare Other | Attending: Oncology

## 2018-10-08 ENCOUNTER — Telehealth: Payer: Self-pay | Admitting: Oncology

## 2018-10-08 VITALS — BP 164/72 | HR 100 | Temp 99.1°F | Resp 18 | Ht 75.0 in | Wt 176.4 lb

## 2018-10-08 DIAGNOSIS — Z794 Long term (current) use of insulin: Secondary | ICD-10-CM | POA: Insufficient documentation

## 2018-10-08 DIAGNOSIS — C182 Malignant neoplasm of ascending colon: Secondary | ICD-10-CM | POA: Diagnosis not present

## 2018-10-08 DIAGNOSIS — E119 Type 2 diabetes mellitus without complications: Secondary | ICD-10-CM | POA: Diagnosis not present

## 2018-10-08 LAB — CEA (IN HOUSE-CHCC): CEA (CHCC-In House): 5.85 ng/mL — ABNORMAL HIGH (ref 0.00–5.00)

## 2018-10-08 NOTE — Progress Notes (Signed)
  Greg Robertson   Diagnosis: Colon cancer  INTERVAL HISTORY:   Greg Robertson returns as scheduled.  He feels well.  Good appetite and energy level.  No difficulty with bowel function.  He last underwent upper and lower endoscopy in October 2019.  Objective:  Vital signs in last 24 hours:  Blood pressure (!) 164/72, pulse 100, temperature 99.1 F (37.3 C), temperature source Temporal, resp. rate 18, height 6' 3" (1.905 m), weight 176 lb 6.4 oz (80 kg), SpO2 100 %.   Limited examination secondary to distancing with the COVID pandemic Lymphatics: No cervical, supraclavicular, axillary, or inguinal nodes GI: No hepatosplenomegaly, no mass, nontender Vascular: No leg edema     Lab Results:  Lab Results  Component Value Date   WBC 5.9 05/24/2018   HGB 14.6 05/24/2018   HCT 43.9 05/24/2018   MCV 93 05/24/2018   PLT 167 05/24/2018   NEUTROABS 3.3 05/24/2018    CMP  Lab Results  Component Value Date   NA 141 05/24/2018   K 4.0 05/24/2018   CL 101 05/24/2018   CO2 25 05/24/2018   GLUCOSE 111 (H) 05/24/2018   BUN 18 05/24/2018   CREATININE 1.01 05/24/2018   CALCIUM 9.3 05/24/2018   PROT 6.1 05/24/2018   ALBUMIN 4.4 05/24/2018   AST 27 05/24/2018   ALT 25 05/24/2018   ALKPHOS 78 05/24/2018   BILITOT 0.6 05/24/2018   GFRNONAA 77 05/24/2018   GFRAA 89 05/24/2018    Lab Results  Component Value Date   CEA1 6.15 (H) 04/05/2018     Medications: I have reviewed the patient's current medications.   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 Tumor microsatellite unstable, K-ras wild type. Adjuvant FOLFOX chemotherapy initiated on 07/14/2011. Cycle 12 given on 01/05/2012.  -Negative surveillance colonoscopy 06/06/2012  -Negative surveillance CT scans 04/25/2013 -Negative surveillance CT scans 07/21/2014 -Negative surveillance  colonoscopy 09/10/2014 -Negative surveillance colonoscopy 01/16/2018 2. Microcytic anemia. Likely secondary to iron deficiency. Resolved. 3. Insulin-dependent diabetes. 4. Glaucoma. 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 He has seen a Retail buyer. He has been contacted with updated information regarding screening recommendations. 6. Status post Port-A-Cath placement 07/01/2011. The Port-A-Cath has been removed. 7. History of thrombocytopenia secondary to chemotherapy. Oxaliplatin was held with cycle 3. Oxaliplatin was resumed with cycle 4 at a 25% dose reduction. Oxaliplatin was further dose reduced beginning with cycle 6 due to cytopenias. The oxaliplatin was held with cycle 8, cycle 10 and cycle 11. 8. History of neutropenia secondary to chemotherapy. 9. Chronic mild elevation of the CEA 10. Upper endoscopy 01/16/2018-normal esophagus;  mild gastritis; 1 duodenal polyp (tubular adenoma); distal stomach biopsy with antral and corpus mucosa with slight chronic inflammation, negative for H. Pylori,  no intestinal metaplasia dysplasia or malignancy.    Disposition: Greg Robertson remains in clinical remission from colon cancer.  We will follow-up on the CEA from today.  He will return for an office visit and CEA in 6 months.  He continues surveillance upper and lower endoscopy with Dr. Ardis Hughs.  15 minutes were spent with the patient today.  The majority of the time was used for counseling and coordination of care.  Betsy Coder, MD  10/08/2018  3:21 PM

## 2018-10-08 NOTE — Telephone Encounter (Signed)
Gave avs and calendar ° °

## 2018-10-09 ENCOUNTER — Telehealth: Payer: Self-pay

## 2018-10-09 NOTE — Telephone Encounter (Signed)
TC to patient per Greg Robertson to let him know his CEA is stable, follow-up as scheduled. Patient verbalized understanding. No further problems pr concerns at this time.

## 2018-10-09 NOTE — Telephone Encounter (Signed)
TC to patient per Lattie Haw to let him know his CEA is stable, follow-up as scheduled. Patient verbalized understanding. No further problems pr concerns at this time.

## 2018-10-15 ENCOUNTER — Other Ambulatory Visit: Payer: Self-pay | Admitting: Family Medicine

## 2018-10-29 ENCOUNTER — Other Ambulatory Visit: Payer: Self-pay | Admitting: Family Medicine

## 2018-10-29 MED ORDER — VITAMIN D (ERGOCALCIFEROL) 1.25 MG (50000 UNIT) PO CAPS: 50000.0000 [IU] | ORAL_CAPSULE | ORAL | 0 refills | Status: DC

## 2018-10-29 MED ORDER — TRESIBA FLEXTOUCH 100 UNIT/ML ~~LOC~~ SOPN: 16.0000 [IU] | PEN_INJECTOR | SUBCUTANEOUS | 6 refills | Status: DC

## 2018-10-29 NOTE — Telephone Encounter (Signed)
Patient advised rx's sent to pharmacy and he will come by and have labs.

## 2018-11-30 ENCOUNTER — Other Ambulatory Visit: Payer: Self-pay

## 2018-11-30 ENCOUNTER — Other Ambulatory Visit: Payer: Medicare Other

## 2018-11-30 DIAGNOSIS — E785 Hyperlipidemia, unspecified: Secondary | ICD-10-CM | POA: Diagnosis not present

## 2018-11-30 DIAGNOSIS — I10 Essential (primary) hypertension: Secondary | ICD-10-CM

## 2018-11-30 DIAGNOSIS — IMO0001 Reserved for inherently not codable concepts without codable children: Secondary | ICD-10-CM

## 2018-11-30 DIAGNOSIS — E1165 Type 2 diabetes mellitus with hyperglycemia: Secondary | ICD-10-CM | POA: Diagnosis not present

## 2018-11-30 DIAGNOSIS — E1069 Type 1 diabetes mellitus with other specified complication: Secondary | ICD-10-CM

## 2018-11-30 LAB — BAYER DCA HB A1C WAIVED: HB A1C (BAYER DCA - WAIVED): 9.1 % — ABNORMAL HIGH (ref <7.0)

## 2018-12-01 LAB — CMP14+EGFR
ALT: 17 IU/L (ref 0–44)
AST: 23 IU/L (ref 0–40)
Albumin/Globulin Ratio: 2.5 — ABNORMAL HIGH (ref 1.2–2.2)
Albumin: 4.3 g/dL (ref 3.8–4.8)
Alkaline Phosphatase: 77 IU/L (ref 39–117)
BUN/Creatinine Ratio: 19 (ref 10–24)
BUN: 22 mg/dL (ref 8–27)
Bilirubin Total: <0.2 mg/dL (ref 0.0–1.2)
CO2: 26 mmol/L (ref 20–29)
Calcium: 9.5 mg/dL (ref 8.6–10.2)
Chloride: 105 mmol/L (ref 96–106)
Creatinine, Ser: 1.17 mg/dL (ref 0.76–1.27)
GFR calc Af Amer: 74 mL/min/{1.73_m2} (ref >59)
GFR calc non Af Amer: 64 mL/min/{1.73_m2} (ref >59)
Globulin, Total: 1.7 g/dL (ref 1.5–4.5)
Glucose: 91 mg/dL (ref 65–99)
Potassium: 4.6 mmol/L (ref 3.5–5.2)
Sodium: 143 mmol/L (ref 134–144)
Total Protein: 6 g/dL (ref 6.0–8.5)

## 2018-12-01 LAB — LIPID PANEL
Chol/HDL Ratio: 2.4 ratio (ref 0.0–5.0)
Cholesterol, Total: 178 mg/dL (ref 100–199)
HDL: 75 mg/dL (ref >39)
LDL Calculated: 89 mg/dL (ref 0–99)
Triglycerides: 72 mg/dL (ref 0–149)
VLDL Cholesterol Cal: 14 mg/dL (ref 5–40)

## 2018-12-03 ENCOUNTER — Other Ambulatory Visit: Payer: Self-pay | Admitting: Family Medicine

## 2018-12-03 DIAGNOSIS — H348112 Central retinal vein occlusion, right eye, stable: Secondary | ICD-10-CM | POA: Diagnosis not present

## 2018-12-03 DIAGNOSIS — H5213 Myopia, bilateral: Secondary | ICD-10-CM | POA: Diagnosis not present

## 2018-12-03 DIAGNOSIS — H52223 Regular astigmatism, bilateral: Secondary | ICD-10-CM | POA: Diagnosis not present

## 2018-12-03 DIAGNOSIS — H02834 Dermatochalasis of left upper eyelid: Secondary | ICD-10-CM | POA: Diagnosis not present

## 2018-12-03 DIAGNOSIS — H2513 Age-related nuclear cataract, bilateral: Secondary | ICD-10-CM | POA: Diagnosis not present

## 2018-12-03 DIAGNOSIS — H02831 Dermatochalasis of right upper eyelid: Secondary | ICD-10-CM | POA: Diagnosis not present

## 2018-12-03 DIAGNOSIS — E119 Type 2 diabetes mellitus without complications: Secondary | ICD-10-CM | POA: Diagnosis not present

## 2018-12-03 DIAGNOSIS — H401134 Primary open-angle glaucoma, bilateral, indeterminate stage: Secondary | ICD-10-CM | POA: Diagnosis not present

## 2018-12-03 DIAGNOSIS — IMO0001 Reserved for inherently not codable concepts without codable children: Secondary | ICD-10-CM

## 2018-12-03 DIAGNOSIS — H25041 Posterior subcapsular polar age-related cataract, right eye: Secondary | ICD-10-CM | POA: Diagnosis not present

## 2018-12-03 DIAGNOSIS — H524 Presbyopia: Secondary | ICD-10-CM | POA: Diagnosis not present

## 2018-12-03 LAB — HM DIABETES EYE EXAM

## 2018-12-03 MED ORDER — TRESIBA FLEXTOUCH 100 UNIT/ML ~~LOC~~ SOPN: 40.0000 [IU] | PEN_INJECTOR | SUBCUTANEOUS | 6 refills | Status: DC

## 2018-12-04 ENCOUNTER — Other Ambulatory Visit: Payer: Self-pay

## 2018-12-05 ENCOUNTER — Encounter: Payer: Self-pay | Admitting: Family Medicine

## 2018-12-05 ENCOUNTER — Ambulatory Visit (INDEPENDENT_AMBULATORY_CARE_PROVIDER_SITE_OTHER): Payer: Medicare Other | Admitting: Family Medicine

## 2018-12-05 VITALS — BP 159/70 | HR 75 | Temp 98.0°F | Ht 74.0 in | Wt 171.0 lb

## 2018-12-05 DIAGNOSIS — E1069 Type 1 diabetes mellitus with other specified complication: Secondary | ICD-10-CM | POA: Diagnosis not present

## 2018-12-05 DIAGNOSIS — I152 Hypertension secondary to endocrine disorders: Secondary | ICD-10-CM

## 2018-12-05 DIAGNOSIS — E139 Other specified diabetes mellitus without complications: Secondary | ICD-10-CM

## 2018-12-05 DIAGNOSIS — I5032 Chronic diastolic (congestive) heart failure: Secondary | ICD-10-CM | POA: Diagnosis not present

## 2018-12-05 DIAGNOSIS — E785 Hyperlipidemia, unspecified: Secondary | ICD-10-CM

## 2018-12-05 DIAGNOSIS — E1159 Type 2 diabetes mellitus with other circulatory complications: Secondary | ICD-10-CM | POA: Diagnosis not present

## 2018-12-05 DIAGNOSIS — B351 Tinea unguium: Secondary | ICD-10-CM

## 2018-12-05 DIAGNOSIS — I4891 Unspecified atrial fibrillation: Secondary | ICD-10-CM | POA: Diagnosis not present

## 2018-12-05 DIAGNOSIS — D696 Thrombocytopenia, unspecified: Secondary | ICD-10-CM

## 2018-12-05 DIAGNOSIS — I10 Essential (primary) hypertension: Secondary | ICD-10-CM

## 2018-12-05 MED ORDER — LISINOPRIL 10 MG PO TABS: 10.0000 mg | ORAL_TABLET | Freq: Every day | ORAL | 5 refills | Status: DC

## 2018-12-05 MED ORDER — TRESIBA FLEXTOUCH 100 UNIT/ML ~~LOC~~ SOPN: 20.0000 [IU] | PEN_INJECTOR | SUBCUTANEOUS | 6 refills | Status: DC

## 2018-12-05 MED ORDER — SIMVASTATIN 40 MG PO TABS: ORAL_TABLET | ORAL | 0 refills | Status: DC

## 2018-12-05 NOTE — Patient Instructions (Addendum)
Continue to monitor your blood sugars as we discussed and record them. Bring the log to your next appointment.  Take your medications as directed.    Goal Blood glucose:    Fasting (before meals) = 80 to 130   Within 2 hours of eating = less than 180   Understanding your Hemoglobin A1c:     Diabetes Mellitus and Nutrition    I think that you would greatly benefit from seeing a nutritionist. If this is something you are interested in, please call Dr Sykes at [phone] to schedule an appointment.   When you have diabetes (diabetes mellitus), it is very important to have healthy eating habits because your blood sugar (glucose) levels are greatly affected by what you eat and drink. Eating healthy foods in the appropriate amounts, at about the same times every day, can help you:  Control your blood glucose.  Lower your risk of heart disease.  Improve your blood pressure.  Reach or maintain a healthy weight.  Every person with diabetes is different, and each person has different needs for a meal plan. Your health care provider may recommend that you work with a diet and nutrition specialist (dietitian) to make a meal plan that is best for you. Your meal plan may vary depending on factors such as:  The calories you need.  The medicines you take.  Your weight.  Your blood glucose, blood pressure, and cholesterol levels.  Your activity level.  Other health conditions you have, such as heart or kidney disease.  How do carbohydrates affect me? Carbohydrates affect your blood glucose level more than any other type of food. Eating carbohydrates naturally increases the amount of glucose in your blood. Carbohydrate counting is a method for keeping track of how many carbohydrates you eat. Counting carbohydrates is important to keep your blood glucose at a healthy level, especially if you use insulin or take certain oral diabetes medicines. It is important to know how many  carbohydrates you can safely have in each meal. This is different for every person. Your dietitian can help you calculate how many carbohydrates you should have at each meal and for snack. Foods that contain carbohydrates include:  Bread, cereal, rice, pasta, and crackers.  Potatoes and corn.  Peas, beans, and lentils.  Milk and yogurt.  Fruit and juice.  Desserts, such as cakes, cookies, ice cream, and candy.  How does alcohol affect me? Alcohol can cause a sudden decrease in blood glucose (hypoglycemia), especially if you use insulin or take certain oral diabetes medicines. Hypoglycemia can be a life-threatening condition. Symptoms of hypoglycemia (sleepiness, dizziness, and confusion) are similar to symptoms of having too much alcohol. If your health care provider says that alcohol is safe for you, follow these guidelines:  Limit alcohol intake to no more than 1 drink per day for nonpregnant women and 2 drinks per day for men. One drink equals 12 oz of beer, 5 oz of wine, or 1 oz of hard liquor.  Do not drink on an empty stomach.  Keep yourself hydrated with water, diet soda, or unsweetened iced tea.  Keep in mind that regular soda, juice, and other mixers may contain a lot of sugar and must be counted as carbohydrates.  What are tips for following this plan?  Reading food labels  Start by checking the serving size on the label. The amount of calories, carbohydrates, fats, and other nutrients listed on the label are based on one serving of the food. Many foods   contain more than one serving per package.  Check the total grams (g) of carbohydrates in one serving. You can calculate the number of servings of carbohydrates in one serving by dividing the total carbohydrates by 15. For example, if a food has 30 g of total carbohydrates, it would be equal to 2 servings of carbohydrates.  Check the number of grams (g) of saturated and trans fats in one serving. Choose foods that have  low or no amount of these fats.  Check the number of milligrams (mg) of sodium in one serving. Most people should limit total sodium intake to less than 2,300 mg per day.  Always check the nutrition information of foods labeled as "low-fat" or "nonfat". These foods may be higher in added sugar or refined carbohydrates and should be avoided.  Talk to your dietitian to identify your daily goals for nutrients listed on the label.  Shopping  Avoid buying canned, premade, or processed foods. These foods tend to be high in fat, sodium, and added sugar.  Shop around the outside edge of the grocery store. This includes fresh fruits and vegetables, bulk grains, fresh meats, and fresh dairy.  Cooking  Use low-heat cooking methods, such as baking, instead of high-heat cooking methods like deep frying.  Cook using healthy oils, such as olive, canola, or sunflower oil.  Avoid cooking with butter, cream, or high-fat meats.  Meal planning  Eat meals and snacks regularly, preferably at the same times every day. Avoid going long periods of time without eating.  Eat foods high in fiber, such as fresh fruits, vegetables, beans, and whole grains. Talk to your dietitian about how many servings of carbohydrates you can eat at each meal.  Eat 4-6 ounces of lean protein each day, such as lean meat, chicken, fish, eggs, or tofu. 1 ounce is equal to 1 ounce of meat, chicken, or fish, 1 egg, or 1/4 cup of tofu.  Eat some foods each day that contain healthy fats, such as avocado, nuts, seeds, and fish.  Lifestyle   Check your blood glucose regularly.  Exercise at least 30 minutes 5 or more days each week, or as told by your health care provider.  Take medicines as told by your health care provider.  Do not use any products that contain nicotine or tobacco, such as cigarettes and e-cigarettes. If you need help quitting, ask your health care provider.  Work with a counselor or diabetes educator to  identify strategies to manage stress and any emotional and social challenges.   What are some questions to ask my health care provider?  Do I need to meet with a diabetes educator?  Do I need to meet with a dietitian?  What number can I call if I have questions?  When are the best times to check my blood glucose?   Where to find more information:  American Diabetes Association: diabetes.org/food-and-fitness/food  Academy of Nutrition and Dietetics: www.eatright.org/resources/health/diseases-and-conditions/diabetes  National Institute of Diabetes and Digestive and Kidney Diseases (NIH): www.niddk.nih.gov/health-information/diabetes/overview/diet-eating-physical-activity   Summary  A healthy meal plan will help you control your blood glucose and maintain a healthy lifestyle.  Working with a diet and nutrition specialist (dietitian) can help you make a meal plan that is best for you.  Keep in mind that carbohydrates and alcohol have immediate effects on your blood glucose levels. It is important to count carbohydrates and to use alcohol carefully. This information is not intended to replace advice given to you by your health care provider.   been shown to reduce high blood pressure (hypertension). Additional health benefits may include reducing the risk of type 2 diabetes mellitus, heart disease, and stroke. The DASH eating plan may also help with weight loss.  WHAT DO I NEED TO KNOW ABOUT THE DASH EATING PLAN? For the DASH eating plan, you will follow these general guidelines: Choose foods with a percent daily value for sodium of less than 5% (as listed on the food label). Use salt-free seasonings or herbs instead of table salt or sea  salt. Check with your health care provider or pharmacist before using salt substitutes. Eat lower-sodium products, often labeled as "lower sodium" or "no salt added." Eat fresh foods. Eat more vegetables, fruits, and low-fat dairy products. Choose whole grains. Look for the word "whole" as the first word in the ingredient list. Choose fish and skinless chicken or turkey more often than red meat. Limit fish, poultry, and meat to 6 oz (170 g) each day. Limit sweets, desserts, sugars, and sugary drinks. Choose heart-healthy fats. Limit cheese to 1 oz (28 g) per day. Eat more home-cooked food and less restaurant, buffet, and fast food. Limit fried foods. Cook foods using methods other than frying. Limit canned vegetables. If you do use them, rinse them well to decrease the sodium. When eating at a restaurant, ask that your food be prepared with less salt, or no salt if possible.  WHAT FOODS CAN I EAT? Seek help from a dietitian for individual calorie needs.  Grains Whole grain or whole wheat bread. Brown rice. Whole grain or whole wheat pasta. Quinoa, bulgur, and whole grain cereals. Low-sodium cereals. Corn or whole wheat flour tortillas. Whole grain cornbread. Whole grain crackers. Low-sodium crackers.  Vegetables Fresh or frozen vegetables (raw, steamed, roasted, or grilled). Low-sodium or reduced-sodium tomato and vegetable juices. Low-sodium or reduced-sodium tomato sauce and paste. Low-sodium or reduced-sodium canned vegetables.   Fruits All fresh, canned (in natural juice), or frozen fruits.  Meat and Other Protein Products Ground beef (85% or leaner), grass-fed beef, or beef trimmed of fat. Skinless chicken or turkey. Ground chicken or turkey. Pork trimmed of fat. All fish and seafood. Eggs. Dried beans, peas, or lentils. Unsalted nuts and seeds. Unsalted canned beans.  Dairy Low-fat dairy products, such as skim or 1% milk, 2% or reduced-fat cheeses, low-fat ricotta or cottage  cheese, or plain low-fat yogurt. Low-sodium or reduced-sodium cheeses.  Fats and Oils Tub margarines without trans fats. Light or reduced-fat mayonnaise and salad dressings (reduced sodium). Avocado. Safflower, olive, or canola oils. Natural peanut or almond butter.  Other Unsalted popcorn and pretzels. The items listed above may not be a complete list of recommended foods or beverages. Contact your dietitian for more options.  WHAT FOODS ARE NOT RECOMMENDED?  Grains White bread. White pasta. White rice. Refined cornbread. Bagels and croissants. Crackers that contain trans fat.  Vegetables Creamed or fried vegetables. Vegetables in a cheese sauce. Regular canned vegetables. Regular canned tomato sauce and paste. Regular tomato and vegetable juices.  Fruits Dried fruits. Canned fruit in light or heavy syrup. Fruit juice.  Meat and Other Protein Products Fatty cuts of meat. Ribs, chicken wings, bacon, sausage, bologna, salami, chitterlings, fatback, hot dogs, bratwurst, and packaged luncheon meats. Salted nuts and seeds. Canned beans with salt.  Dairy Whole or 2% milk, cream, half-and-half, and cream cheese. Whole-fat or sweetened yogurt. Full-fat cheeses or blue cheese. Nondairy creamers and whipped toppings. Processed cheese, cheese spreads, or cheese curds.  Condiments Onion and garlic salt,   seasoned salt, table salt, and sea salt. Canned and packaged gravies. Worcestershire sauce. Tartar sauce. Barbecue sauce. Teriyaki sauce. Soy sauce, including reduced sodium. Steak sauce. Fish sauce. Oyster sauce. Cocktail sauce. Horseradish. Ketchup and mustard. Meat flavorings and tenderizers. Bouillon cubes. Hot sauce. Tabasco sauce. Marinades. Taco seasonings. Relishes.  Fats and Oils Butter, stick margarine, lard, shortening, ghee, and bacon fat. Coconut, palm kernel, or palm oils. Regular salad dressings.  Other Pickles and olives. Salted popcorn and pretzels.  The items listed above may  not be a complete list of foods and beverages to avoid. Contact your dietitian for more information.  WHERE CAN I FIND MORE INFORMATION? National Heart, Lung, and Blood Institute: www.nhlbi.nih.gov/health/health-topics/topics/dash/ Document Released: 03/24/2011 Document Revised: 08/19/2013 Document Reviewed: 02/06/2013 [employer] Patient Information 2015 [employer]. This information is not intended to replace advice given to you by your health care provider. Make sure you discuss any questions you have with your health care provider.   I think that you would greatly benefit from seeing a nutritionist.  If you are interested, please call Dr Sykes at [phone] to schedule an appointment.   

## 2018-12-05 NOTE — Progress Notes (Signed)
Subjective:  Patient ID: Greg Robertson, male    DOB: [birth year], 68 y.o.   MRN: [id number]  Patient Care Team: Dettinger, Fransisca Kaufmann, MD as PCP - General (Family Medicine) Inda Castle, MD (Inactive) as Consulting Physician (Gastroenterology) Ladell Pier, MD as Consulting Physician (Oncology) Minus Breeding, MD as Consulting Physician (Cardiology) [facility] Milus Banister, MD as Attending Physician (Gastroenterology)   Chief Complaint:  [facility] papers, Medical Management of Chronic Issues, and Diabetes   HPI: Greg Robertson is a 68 y.o. male presenting on 12/05/2018 for [facility] papers, Medical Management of Chronic Issues, and Diabetes   1. Diabetes 1.5, managed as type 1 ([place])   Greg Robertson is a 68 y.o. male who presents for follow up evaluation of Type 1  diabetes mellitus.  Current symptoms include increase appetite, polydipsia and polyuria and have been stable. Symptoms have been present for several days. Patient denies foot ulcerations, nausea, paresthesia of the feet, visual disturbances, vomiting and weight loss.  Current diabetic medications include Tresiba, Novolog.  Compliant with meds - No. It was documented in chart pt was taking 20 units of Tresiba daily. Pt states he is only taking 16 units daily due to hypoglycemic episodes when he tries to increase the dose. States he has tried to increase the dose as advised by Dr. Laurance Flatten and the clinical pharmacist but has not.   Current monitoring regimen: home blood tests - several times daily Home blood sugar records: trend: fluctuating a bit Any episodes of hypoglycemia? yes - per pt report. Did not bring log today  Known diabetic complications: cardiovascular disease Cardiovascular risk factors: advanced age (older than 60 for men, 80 for women), diabetes mellitus, dyslipidemia, hypertension and male gender Eye exam current (within one year): yes Podiatry yearly?  No, referral placed  today Weight trend: stable Prior visit with CDE: yes  Current diet: in general, a "healthy" diet   Current exercise: none  PNA Vaccine UTD?  No Hep B Vaccine?  Yes Tdap Vaccine UTD?  Yes  Is He on ACE inhibitor or angiotensin II receptor blocker?  Yes  lisinopril (Zestril) Is He on statin? Yes simvastatin  Is He on ASA 81 mg daily?  Yes  Lab Review Glucose  Date Value  11/30/2018 91 mg/dL  05/24/2018 111 mg/dL (H)  01/17/2018 129 mg/dL (H)  07/21/2014 309 mg/dl (H)  04/25/2013 69 mg/dl (L)  04/06/2012 90 mg/dl  01/05/2012 278 mg/dl (H)  12/22/2011 104 mg/dl (H)   Glucose, Bld (mg/dL)  Date Value  04/08/2013 188 (H)  04/07/2013 210 (H)  04/07/2013 249 (H)   Chloride (mEq/L)  Date Value  07/21/2014 99  04/25/2013 103   CO2  Date Value  11/30/2018 26 mmol/L  05/24/2018 25 mmol/L  01/17/2018 26 mmol/L  07/21/2014 27 mEq/L  04/25/2013 28 mEq/L  04/06/2012 29 mEq/L   BUN (mg/dL)  Date Value  11/30/2018 22  05/24/2018 18  01/17/2018 15  07/21/2014 12.0  04/25/2013 12.0  04/06/2012 12.0   Creatinine (mg/dL)  Date Value  07/21/2014 1.2  04/25/2013 1.1  04/06/2012 1.1   Creat (mg/dL)  Date Value  10/29/2012 0.90   Creatinine, Ser (mg/dL)  Date Value  11/30/2018 1.17  05/24/2018 1.01  01/17/2018 1.04   Lab Results  Component Value Date   HGBA1C 9.1 (H) 11/30/2018   HGBA1C 8.3 (H) 05/24/2018   HGBA1C 8.9 (H) 01/17/2018     2. Hypertension associated with diabetes ([place])  Complaint with meds - Yes Current Medications - lisinopril 5 mg daily Checking BP at home - No Exercising Regularly - No Watching Salt intake - No Pertinent ROS:  Headache - No Fatigue - No Visual Disturbances - No Chest pain - No Dyspnea - No Palpitations - No LE edema - No They report good compliance with medications and can restate their regimen by memory. No medication side effects.  Family, social, and smoking history reviewed.   BP Readings from Last 3  Encounters:  12/05/18 (!) 159/70  10/08/18 (!) 164/72  05/24/18 135/70   CMP Latest Ref Rng & Units 11/30/2018 05/24/2018 01/17/2018  Glucose 65 - 99 mg/dL 91 111(H) 129(H)  BUN 8 - 27 mg/dL 22 18 15   Creatinine 0.76 - 1.27 mg/dL 1.17 1.01 1.04  Sodium 134 - 144 mmol/L 143 141 141  Potassium 3.5 - 5.2 mmol/L 4.6 4.0 5.0  Chloride 96 - 106 mmol/L 105 101 101  CO2 20 - 29 mmol/L 26 25 26   Calcium 8.6 - 10.2 mg/dL 9.5 9.3 9.5  Total Protein 6.0 - 8.5 g/dL 6.0 6.1 6.6  Total Bilirubin 0.0 - 1.2 mg/dL <0.2 0.6 0.5  Alkaline Phos 39 - 117 IU/L 77 78 88  AST 0 - 40 IU/L 23 27 21   ALT 0 - 44 IU/L 17 25 16       3. Hyperlipidemia due to type 1 diabetes mellitus ([place])  Compliant with medications - Yes Current medications - simvastatin Side effects from medications - No Diet - generally healthy Exercise - none  Lab Results  Component Value Date   CHOL 178 11/30/2018   HDL 75 11/30/2018   LDLCALC 89 11/30/2018   TRIG 72 11/30/2018   CHOLHDL 2.4 11/30/2018     Family and personal medical history reviewed. Smoking and ETOH history reviewed.    4. Chronic diastolic heart failure, NYHA class 1 ([place])  Doing well. No dyspnea, PND, orthopnea, chest pain, weight gain, or lower extremity edema. Followed by cardiology, last seen in November 2019.    5. Atrial fibrillation by electrocardiogram ([place])  No recurrent episodes of A-Fib. No palpitations, chest pain, weakness, dizziness, shortness of breath, or syncope. Followed by cardiology.    6. Thrombocytopenia (HCC)  Last platelet count normal. No abnormal bleeding or bruising. No rash to lower extremities.    7. Onychomycosis  Increased thickness of left great toenail and brittle right great toenail. Ongoing for several months. Has tried several treatments without success. Would like referral to podiatry.      Relevant past medical, surgical, family, and social history reviewed and updated as indicated.  Allergies and medications reviewed  and updated. Date reviewed: Chart in Epic.   Past Medical History:  Diagnosis Date  . Allergic rhinitis   . Anemia   . Atrial fibrillation (HCC)    Persistent  . Cancer of ascending colon, 7cm 05/25/2011  . Diabetes mellitus type I ([place])   . Glaucoma   . Hypertension   . Pneumonia 1979 or 1980  . Prostate nodule   . Substance abuse [facility])     Past Surgical History:  Procedure Laterality Date  . broken left shoulder blade and collar bone    . FINGER SURGERY  2009   right middle  . LAPAROSCOPIC ASSISTED 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    . NASAL FRACTURE SURGERY  1968  . PORTACATH PLACEMENT  07/01/2011   Procedure: INSERTION PORT-A-CATH;  Surgeon: Adin Hector, MD;  Location: [facility] ORS;  Service:  General;  Laterality: Left;  Insertion of Port-A-Catheter Left Internal Jugular  . TONSILLECTOMY  1957 - approximate    Social History   Socioeconomic History  . Marital status: Single    Spouse name: Not on file  . Number of children: 0  . Years of education: 16  . Highest education level: Bachelor's degree (e.g., BA, AB, BS)  Occupational History  . Occupation: Retired    Comment: Geographical information systems officer  . Financial resource strain: Not hard at all  . Food insecurity    Worry: Never true    Inability: Never true  . Transportation needs    Medical: No    Non-medical: No  Tobacco Use  . Smoking status: Former Smoker    Packs/day: 1.00    Years: 15.00    Pack years: 15.00    Types: Cigarettes    Quit date: 04/18/1993    Years since quitting: 25.6  . Smokeless tobacco: Never Used  . Tobacco comment: marijuana every night   Substance and Sexual Activity  . Alcohol use: Yes    Alcohol/week: 5.0 standard drinks    Types: 3 Cans of beer, 2 Shots of liquor per week    Comment: 1 drink every 2 days  . Drug use: Yes    Types: Marijuana    Comment: once a night marijuana  . Sexual activity: Not Currently  Lifestyle  . Physical activity    Days per week: 5 days     Minutes per session: 40 min  . Stress: Only a little  Relationships  . Social connections    Talks on phone: More than three times a week    Gets together: More than three times a week    Attends religious service: Never    Active member of club or organization: No    Attends meetings of clubs or organizations: Never    Relationship status: Never married  . Intimate partner violence    Fear of current or ex partner: No    Emotionally abused: No    Physically abused: No    Forced sexual activity: No  Other Topics Concern  . Not on file  Social History Narrative   Lives alone.  Retired from [employer].     Outpatient Encounter Medications as of 12/05/2018  Medication Sig  . acetaminophen (TYLENOL) 500 MG tablet Take 1,000 mg by mouth every 6 (six) hours as needed. Pain   . Ascorbic Acid (VITAMIN C PO) Take 1 tablet by mouth every morning.   Marland Kitchen glucose blood test strip Use as instructed  . insulin aspart (NOVOLOG) 100 UNIT/ML injection INJECT 8-15 UNITS THREE TIMES DAILY BEFORE MEALS  . insulin degludec (TRESIBA FLEXTOUCH) 100 UNIT/ML SOPN FlexTouch Pen Inject 0.2 mLs (20 Units total) into the skin every morning.  Marland Kitchen levocetirizine (XYZAL) 5 MG tablet Take 1 tablet (5 mg total) by mouth daily.  . Multiple Vitamin (MULTIVITAMIN) tablet Take 1 tablet by mouth every morning.   . simvastatin (ZOCOR) 40 MG tablet TAKE 1 TABLET BY MOUTH EVERYDAY AT BEDTIME  . triamcinolone (NASACORT) 55 MCG/ACT AERO nasal inhaler INSTILL 1 SPRAY IN EACH NOSTRIL ONCE OR TWICE DAILY AS NEEDED.  . Vitamin D, Ergocalciferol, (DRISDOL) 1.25 MG (50000 UT) CAPS capsule Take 1 capsule (50,000 Units total) by mouth every 7 (seven) days.  . [DISCONTINUED] insulin degludec (TRESIBA FLEXTOUCH) 100 UNIT/ML SOPN FlexTouch Pen Inject 0.4 mLs (40 Units total) into the skin every morning.  . [DISCONTINUED] lisinopril (PRINIVIL,ZESTRIL) 5  MG tablet Take 1 tablet (5 mg total) by mouth daily.  . [DISCONTINUED] simvastatin  (ZOCOR) 40 MG tablet TAKE 1 TABLET BY MOUTH EVERYDAY AT BEDTIME  . aspirin EC 81 MG tablet Take 81 mg by mouth daily.  Marland Kitchen lisinopril (ZESTRIL) 10 MG tablet Take 1 tablet (10 mg total) by mouth daily.   No facility-administered encounter medications on file as of 12/05/2018.     Allergies  Allergen Reactions  . Oxycodone Nausea And Vomiting    Review of Systems  Constitutional: Positive for appetite change. Negative for activity change, chills, diaphoresis, fatigue, fever and unexpected weight change.  Eyes: Negative for photophobia and visual disturbance.  Respiratory: Negative for cough, chest tightness, shortness of breath and wheezing.   Cardiovascular: Negative for chest pain, palpitations and leg swelling.  Gastrointestinal: Negative for abdominal distention, abdominal pain, anal bleeding, blood in stool, constipation, diarrhea, nausea and rectal pain.  Endocrine: Positive for polydipsia, polyphagia and polyuria. Negative for cold intolerance and heat intolerance.  Genitourinary: Negative for decreased urine volume, difficulty urinating and penile pain.  Musculoskeletal: Negative for arthralgias, back pain and myalgias.  Skin: Negative for color change, pallor and rash.       Toenail changes  Neurological: Negative for dizziness, tremors, seizures, syncope, facial asymmetry, speech difficulty, weakness, light-headedness, numbness and headaches.  Hematological: Negative for adenopathy. Does not bruise/bleed easily.  Psychiatric/Behavioral: Negative for confusion and sleep disturbance. The patient is not nervous/anxious.   All other systems reviewed and are negative.       Objective:  BP (!) 159/70   Pulse 75   Temp 98 F (36.7 C)   Ht 6\' 2"  (1.88 m)   Wt 171 lb (77.6 kg)   BMI 21.96 kg/m    Wt Readings from Last 3 Encounters:  12/05/18 171 lb (77.6 kg)  10/08/18 176 lb 6.4 oz (80 kg)  10/02/18 171 lb (77.6 kg)    Physical Exam Vitals signs and nursing note reviewed.   Constitutional:      General: He is not in acute distress.    Appearance: Normal appearance. He is well-developed, well-groomed and normal weight. He is not ill-appearing, toxic-appearing or diaphoretic.  HENT:     Head: Normocephalic and atraumatic.     Jaw: There is normal jaw occlusion.     Right Ear: Hearing, tympanic membrane, ear canal and external ear normal.     Left Ear: Hearing, tympanic membrane, ear canal and external ear normal.     Nose: Nose normal.     Mouth/Throat:     Lips: Pink.     Mouth: Mucous membranes are moist.     Pharynx: Oropharynx is clear. Uvula midline.  Eyes:     General: Lids are normal.     Extraocular Movements: Extraocular movements intact.     Conjunctiva/sclera: Conjunctivae normal.     Pupils: Pupils are equal, round, and reactive to light.  Neck:     Musculoskeletal: Normal range of motion and neck supple.     Thyroid: No thyroid mass, thyromegaly or thyroid tenderness.     Vascular: No carotid bruit or JVD.     Trachea: Trachea and phonation normal.  Cardiovascular:     Rate and Rhythm: Normal rate and regular rhythm.     Chest Wall: PMI is not displaced.     Pulses: Normal pulses.          Dorsalis pedis pulses are 2+ on the right side and 2+ on the left side.  Posterior tibial pulses are 2+ on the right side and 2+ on the left side.     Heart sounds: Normal heart sounds. No murmur. No friction rub. No gallop.   Pulmonary:     Effort: Pulmonary effort is normal. No respiratory distress.     Breath sounds: Normal breath sounds. No wheezing.  Abdominal:     General: Bowel sounds are normal. There is no distension or abdominal bruit.     Palpations: Abdomen is soft. There is no hepatomegaly or splenomegaly.     Tenderness: There is no abdominal tenderness. There is no right CVA tenderness or left CVA tenderness.     Hernia: No hernia is present.  Musculoskeletal: Normal range of motion.     Right lower leg: No edema.     Left lower  leg: No edema.     Right foot: Normal range of motion. No deformity, bunion, Charcot foot, foot drop or prominent metatarsal heads.     Left foot: Normal range of motion. No deformity, bunion, Charcot foot, foot drop or prominent metatarsal heads.  Feet:     Right foot:     Protective Sensation: 10 sites tested. 10 sites sensed.     Skin integrity: Skin integrity normal.     Toenail Condition: Fungal disease present.    Left foot:     Protective Sensation: 10 sites tested. 10 sites sensed.     Skin integrity: Skin integrity normal.     Toenail Condition: Left toenails are abnormally thick. Fungal disease present. Lymphadenopathy:     Cervical: No cervical adenopathy.  Skin:    General: Skin is warm and dry.     Capillary Refill: Capillary refill takes less than 2 seconds.     Coloration: Skin is not cyanotic, jaundiced or pale.     Findings: No rash.  Neurological:     General: No focal deficit present.     Mental Status: He is alert and oriented to person, place, and time.     Cranial Nerves: Cranial nerves are intact. No cranial nerve deficit.     Sensory: Sensation is intact. No sensory deficit.     Motor: Motor function is intact. No weakness.     Coordination: Coordination is intact. Coordination normal.     Gait: Gait is intact. Gait normal.     Deep Tendon Reflexes: Reflexes are normal and symmetric. Reflexes normal.  Psychiatric:        Attention and Perception: Attention and perception normal.        Mood and Affect: Mood and affect normal.        Speech: Speech normal.        Behavior: Behavior normal. Behavior is cooperative.        Thought Content: Thought content normal.        Cognition and Memory: Cognition and memory normal.        Judgment: Judgment normal.     Results for orders placed or performed in visit on 12/05/18  HM DIABETES EYE EXAM  Result Value Ref Range   HM Diabetic Eye Exam No Retinopathy No Retinopathy       Pertinent labs & imaging  results that were available during my care of the patient were reviewed by me and considered in my medical decision making.  Assessment & Plan:  Greg Robertson was seen today for dmv papers, medical management of chronic issues and diabetes.  Diagnoses and all orders for this visit:  Diabetes 1.5, managed as type  1 ([place]) Lab Results  Component Value Date   HGBA1C 9.1 (H) 11/30/2018   HGBA1C 8.3 (H) 05/24/2018   HGBA1C 8.9 (H) 01/17/2018   Diabetes Control:   Poor, A1C 9.1.  Instruction/counseling given: reminded to get eye exam, reminded to bring blood glucose meter & log to each visit, reminded to bring medications to each visit, discussed foot care, discussed diet, discussed sick day management, provided printed educational material and other instruction/counseling: need to increase Tresiba dosing.    1.  Rx changes: increase Tresiba to 20 units daily. Pt very reluctant to do this as he says he is a very brittle diabetic. A1C's have been high for the last 9 months. A1C expalined to pt in detail and the average blood sugars that correlate with the A1C results. Pt states he has hypoglycemic episodes, blood sugars in the 60's, if he increases his [place]. Pt agrees to increase [place] to 20 units daily. Pt will keep a detailed BS log and bring to next visit. Discussed returning to an endocrinologist, pt declined. May need to change Novolog dosing.  2.  Education: Reviewed 'ABCs' of diabetes management (respective goals in parentheses):  A1C (<7), blood pressure (<130/80), BMI (<25), and cholesterol (LDL <100). 3.  Discussed pathophysiology of DM; difference between type 1 and type 2 DM. 4.  CHO counting diet discussed.  Reviewed CHO amount in various foods and how to read nutrition labels.  Discussed recommended serving sizes.  5.  Recommend check BG several times a week and record readings to bring to next appointment. Report persistent high or low readings. 6.  Recommended increase physical activity -  goal is 150 minutes per week and advance as tolerated. 7.  Adequate sleep, at least 6-8 hours per night.  8.  Smoking cessation.  9.  Follow up: 3 months  -     insulin degludec (TRESIBA FLEXTOUCH) 100 UNIT/ML SOPN FlexTouch Pen; Inject 0.2 mLs (20 Units total) into the skin every morning. -     Ambulatory referral to Podiatry  Hypertension associated with diabetes ([place]) BP porly controlled. Changes were made in regimen today, increased Lisinopril to 10 mg daily. Daily blood pressure log given with instructions on how to fill out and told to bring to next visit. Gaol BP 130/80. Pt aware to report any persistent high or low readings. DASH diet and exercise encouraged. Exercise at least 150 minutes per week and increase as tolerated. Goal BMI > 25. Stress management encouraged. Smoking cessation discussed. Avoid excessive alcohol. Avoid NSAID's. Avoid more than 2000 mg of sodium daily. Medications as prescribed. Follow up as scheduled.  -     lisinopril (ZESTRIL) 10 MG tablet; Take 1 tablet (10 mg total) by mouth daily.  Hyperlipidemia due to type 1 diabetes mellitus ([place]) Diet encouraged - increase intake of fresh fruits and vegetables, increase intake of lean proteins. Bake, broil, or grill foods. Avoid fried, greasy, and fatty foods. Avoid fast foods. Increase intake of fiber-rich whole grains.  Exercise encouraged - at least 150 minutes per week and advance as tolerated. levles look great. Continue medications as prescribed. Follow up in 3-6 months as discussed.  -     simvastatin (ZOCOR) 40 MG tablet; TAKE 1 TABLET BY MOUTH EVERYDAY AT BEDTIME  Chronic diastolic heart failure, NYHA class 1 (HCC) Atrial fibrillation by electrocardiogram (HCC) Both stable, no return of A-Fib. Followed by cardiology. Pt aware to report any new, worsening, or return of symptoms.   Thrombocytopenia (HCC) Platelet count returned to  normal. Report any abnormal bleeding, bruising, or rashes.  Onychomycosis Pt has  failed topical treatments. Requesting referral to podiatry. Will place today.  -     Ambulatory referral to Podiatry     Continue all other maintenance medications.  Follow up plan: Return in about 3 months (around 03/07/2019), or if symptoms worsen or fail to improve, for DM.  Continue healthy lifestyle choices, including diet (rich in fruits, vegetables, and lean proteins, and low in salt and simple carbohydrates) and exercise (at least 30 minutes of moderate physical activity daily).  Educational handout given for DM  The above assessment and management plan was discussed with the patient. The patient verbalized understanding of and has agreed to the management plan. Patient is aware to call the clinic if symptoms persist or worsen. Patient is aware when to return to the clinic for a follow-up visit. Patient educated on when it is appropriate to go to the emergency department.   Monia Pouch, FNP-C [facility] [phone] 12/05/18

## 2018-12-20 DIAGNOSIS — M79676 Pain in unspecified toe(s): Secondary | ICD-10-CM | POA: Diagnosis not present

## 2018-12-20 DIAGNOSIS — B351 Tinea unguium: Secondary | ICD-10-CM | POA: Diagnosis not present

## 2018-12-20 DIAGNOSIS — E1142 Type 2 diabetes mellitus with diabetic polyneuropathy: Secondary | ICD-10-CM | POA: Diagnosis not present

## 2018-12-20 DIAGNOSIS — L84 Corns and callosities: Secondary | ICD-10-CM | POA: Diagnosis not present

## 2018-12-24 ENCOUNTER — Encounter: Payer: Self-pay | Admitting: Gastroenterology

## 2018-12-31 DIAGNOSIS — H02831 Dermatochalasis of right upper eyelid: Secondary | ICD-10-CM | POA: Diagnosis not present

## 2018-12-31 DIAGNOSIS — H401134 Primary open-angle glaucoma, bilateral, indeterminate stage: Secondary | ICD-10-CM | POA: Diagnosis not present

## 2018-12-31 DIAGNOSIS — H40033 Anatomical narrow angle, bilateral: Secondary | ICD-10-CM | POA: Diagnosis not present

## 2018-12-31 DIAGNOSIS — E119 Type 2 diabetes mellitus without complications: Secondary | ICD-10-CM | POA: Diagnosis not present

## 2018-12-31 DIAGNOSIS — H348112 Central retinal vein occlusion, right eye, stable: Secondary | ICD-10-CM | POA: Diagnosis not present

## 2018-12-31 DIAGNOSIS — H02834 Dermatochalasis of left upper eyelid: Secondary | ICD-10-CM | POA: Diagnosis not present

## 2018-12-31 DIAGNOSIS — Z794 Long term (current) use of insulin: Secondary | ICD-10-CM | POA: Diagnosis not present

## 2018-12-31 DIAGNOSIS — H2513 Age-related nuclear cataract, bilateral: Secondary | ICD-10-CM | POA: Diagnosis not present

## 2019-01-24 ENCOUNTER — Other Ambulatory Visit: Payer: Self-pay

## 2019-01-25 ENCOUNTER — Encounter: Payer: Self-pay | Admitting: Family Medicine

## 2019-01-25 ENCOUNTER — Ambulatory Visit (INDEPENDENT_AMBULATORY_CARE_PROVIDER_SITE_OTHER): Payer: Medicare Other | Admitting: Family Medicine

## 2019-01-25 VITALS — BP 151/81 | HR 92 | Temp 97.5°F | Ht 74.0 in | Wt 168.0 lb

## 2019-01-25 DIAGNOSIS — Z23 Encounter for immunization: Secondary | ICD-10-CM

## 2019-01-25 DIAGNOSIS — E1065 Type 1 diabetes mellitus with hyperglycemia: Secondary | ICD-10-CM

## 2019-01-25 DIAGNOSIS — E785 Hyperlipidemia, unspecified: Secondary | ICD-10-CM

## 2019-01-25 DIAGNOSIS — E1069 Type 1 diabetes mellitus with other specified complication: Secondary | ICD-10-CM

## 2019-01-25 DIAGNOSIS — I152 Hypertension secondary to endocrine disorders: Secondary | ICD-10-CM

## 2019-01-25 DIAGNOSIS — E1159 Type 2 diabetes mellitus with other circulatory complications: Secondary | ICD-10-CM

## 2019-01-25 DIAGNOSIS — I10 Essential (primary) hypertension: Secondary | ICD-10-CM | POA: Diagnosis not present

## 2019-01-25 LAB — BAYER DCA HB A1C WAIVED: HB A1C (BAYER DCA - WAIVED): 9 % — ABNORMAL HIGH (ref <7.0)

## 2019-01-25 MED ORDER — INSULIN ASPART 100 UNIT/ML ~~LOC~~ SOLN: SUBCUTANEOUS | 1 refills | Status: DC

## 2019-01-25 MED ORDER — TRESIBA FLEXTOUCH 100 UNIT/ML ~~LOC~~ SOPN: 20.0000 [IU] | PEN_INJECTOR | SUBCUTANEOUS | 6 refills | Status: DC

## 2019-01-25 MED ORDER — LISINOPRIL 10 MG PO TABS: 10.0000 mg | ORAL_TABLET | Freq: Every day | ORAL | 3 refills | Status: DC

## 2019-01-25 MED ORDER — SIMVASTATIN 40 MG PO TABS: ORAL_TABLET | ORAL | 5 refills | Status: DC

## 2019-01-25 NOTE — Patient Instructions (Signed)
Increase mealtime insulin to 1 unit per 4 g of carbohydrates and keep doing the sliding scale  Keep Tresiba at the current dose of 16 units daily

## 2019-01-25 NOTE — Addendum Note (Signed)
Addended by: Nigel Berthold C on: 01/25/2019 11:57 AM   Modules accepted: Orders

## 2019-01-25 NOTE — Progress Notes (Signed)
BP (!) 151/81   Pulse 92   Temp (!) 97.5 F (36.4 C) (Temporal)   Ht 6\' 2"  (1.88 m)   Wt 168 lb (76.2 kg)   SpO2 100%   BMI 21.57 kg/m    Subjective:   Patient ID: Greg Robertson, male    DOB: [birth year], 68 y.o.   MRN: [id number]  HPI: Greg Robertson is a 68 y.o. male presenting on 01/25/2019 for Establish Care ([facility]) and Medical Management of Chronic Issues   HPI Type 2 diabetes mellitus Patient comes in today for recheck of his diabetes. Patient has been currently taking tresiba 16 units and novolog 1 unit per 6 g carbs. Patient is currently on an ACE inhibitor/ARB. Patient has seen an ophthalmologist this year. Patient denies any issues with their feet.   Hypertension Patient is currently on lisinopril, and their blood pressure today is 151/81 although at home he says he usually runs in the 140s over 80s. Patient denies any lightheadedness or dizziness. Patient denies headaches, blurred vision, chest pains, shortness of breath, or weakness. Denies any side effects from medication and is content with current medication.   Hyperlipidemia Patient is coming in for recheck of his hyperlipidemia. The patient is currently taking simvastatin. They deny any issues with myalgias or history of liver damage from it. They deny any focal numbness or weakness or chest pain.   Relevant past medical, surgical, family and social history reviewed and updated as indicated. Interim medical history since our last visit reviewed. Allergies and medications reviewed and updated.  Review of Systems  Constitutional: Negative for chills and fever.  Respiratory: Negative for shortness of breath and wheezing.   Cardiovascular: Negative for chest pain and leg swelling.  Musculoskeletal: Negative for back pain and gait problem.  Skin: Negative for rash.  Neurological: Negative for dizziness, weakness and numbness.  All other systems reviewed and are negative.   Per HPI unless specifically  indicated above   Allergies as of 01/25/2019      Reactions   Oxycodone Nausea And Vomiting      Medication List       Accurate as of January 25, 2019 10:52 AM. If you have any questions, ask your nurse or doctor.        STOP taking these medications   aspirin EC 81 MG tablet Stopped by: Fransisca Kaufmann Alexandrina Fiorini, MD     TAKE these medications   acetaminophen 500 MG tablet Commonly known as: TYLENOL Take 1,000 mg by mouth every 6 (six) hours as needed. Pain   glucose blood test strip Use as instructed   insulin aspart 100 UNIT/ML injection Commonly known as: NovoLOG INJECT 8-15 UNITS THREE TIMES DAILY BEFORE MEALS   levocetirizine 5 MG tablet Commonly known as: XYZAL Take 1 tablet (5 mg total) by mouth daily.   lisinopril 10 MG tablet Commonly known as: Zestril Take 1 tablet (10 mg total) by mouth daily.   multivitamin tablet Take 1 tablet by mouth every morning.   simvastatin 40 MG tablet Commonly known as: ZOCOR TAKE 1 TABLET BY MOUTH EVERYDAY AT BEDTIME   Tresiba FlexTouch 100 UNIT/ML Sopn FlexTouch Pen Generic drug: insulin degludec Inject 0.2 mLs (20 Units total) into the skin every morning.   triamcinolone 55 MCG/ACT Aero nasal inhaler Commonly known as: NASACORT INSTILL 1 SPRAY IN EACH NOSTRIL ONCE OR TWICE DAILY AS NEEDED.   VITAMIN C PO Take 1 tablet by mouth every morning.   Vitamin D (Ergocalciferol) 1.25 MG (  50000 UT) Caps capsule Commonly known as: DRISDOL Take 1 capsule (50,000 Units total) by mouth every 7 (seven) days.   Zioptan 0.0015 % Soln Generic drug: Tafluprost (PF) Apply to eye.        Objective:   BP (!) 151/81   Pulse 92   Temp (!) 97.5 F (36.4 C) (Temporal)   Ht 6\' 2"  (1.88 m)   Wt 168 lb (76.2 kg)   SpO2 100%   BMI 21.57 kg/m   Wt Readings from Last 3 Encounters:  01/25/19 168 lb (76.2 kg)  12/05/18 171 lb (77.6 kg)  10/08/18 176 lb 6.4 oz (80 kg)    Physical Exam Vitals signs and nursing note reviewed.   Constitutional:      General: He is not in acute distress.    Appearance: He is well-developed. He is not diaphoretic.  Eyes:     General: No scleral icterus.    Conjunctiva/sclera: Conjunctivae normal.  Neck:     Musculoskeletal: Neck supple.     Thyroid: No thyromegaly.  Cardiovascular:     Rate and Rhythm: Normal rate and regular rhythm.     Heart sounds: Normal heart sounds. No murmur.  Pulmonary:     Effort: Pulmonary effort is normal. No respiratory distress.     Breath sounds: Normal breath sounds. No wheezing.  Musculoskeletal: Normal range of motion.  Lymphadenopathy:     Cervical: No cervical adenopathy.  Skin:    General: Skin is warm and dry.     Findings: No rash.  Neurological:     Mental Status: He is alert and oriented to person, place, and time.     Coordination: Coordination normal.  Psychiatric:        Behavior: Behavior normal.     Results for orders placed or performed in visit on 12/18/18  HM DIABETES EYE EXAM  Result Value Ref Range   HM Diabetic Eye Exam No Retinopathy No Retinopathy    Assessment & Plan:   Problem List Items Addressed This Visit      Cardiovascular and Mediastinum   Hypertension associated with diabetes ([place])   Relevant Medications   insulin degludec (TRESIBA FLEXTOUCH) 100 UNIT/ML SOPN FlexTouch Pen   simvastatin (ZOCOR) 40 MG tablet   lisinopril (ZESTRIL) 10 MG tablet   insulin aspart (NOVOLOG) 100 UNIT/ML injection     Endocrine   DM (diabetes mellitus), type 1, uncontrolled (HCC)   Relevant Medications   insulin degludec (TRESIBA FLEXTOUCH) 100 UNIT/ML SOPN FlexTouch Pen   simvastatin (ZOCOR) 40 MG tablet   lisinopril (ZESTRIL) 10 MG tablet   insulin aspart (NOVOLOG) 100 UNIT/ML injection   Other Relevant Orders   Microalbumin / creatinine urine ratio   Bayer DCA Hb A1c Waived   Hyperlipidemia due to type 1 diabetes mellitus (HCC)   Relevant Medications   insulin degludec (TRESIBA FLEXTOUCH) 100 UNIT/ML SOPN  FlexTouch Pen   simvastatin (ZOCOR) 40 MG tablet   lisinopril (ZESTRIL) 10 MG tablet   insulin aspart (NOVOLOG) 100 UNIT/ML injection   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 Will check A1c again, last it was 9.1.  Will check urine microalbumin. Continue simvastatin and lisinopril and Tresiba 16 units and NovoLog carb counting and sliding scale.  Follow up plan: Return in about 3 months (around 04/27/2019), or if symptoms worsen or fail to improve, for Type 1 diabetes recheck.  Counseling provided for all of the vaccine components No orders of the defined types were placed in this encounter.   Caryl Pina, MD [facility] 01/25/2019, 10:52 AM

## 2019-01-26 LAB — MICROALBUMIN / CREATININE URINE RATIO
Creatinine, Urine: 155.7 mg/dL
Microalb/Creat Ratio: 12 mg/g creat (ref 0–29)
Microalbumin, Urine: 18.2 ug/mL

## 2019-04-09 ENCOUNTER — Telehealth: Payer: Self-pay | Admitting: Oncology

## 2019-04-09 ENCOUNTER — Inpatient Hospital Stay: Payer: Medicare Other | Attending: Nurse Practitioner | Admitting: Nurse Practitioner

## 2019-04-09 ENCOUNTER — Encounter: Payer: Self-pay | Admitting: Nurse Practitioner

## 2019-04-09 ENCOUNTER — Inpatient Hospital Stay: Payer: Medicare Other

## 2019-04-09 ENCOUNTER — Telehealth: Payer: Self-pay

## 2019-04-09 ENCOUNTER — Other Ambulatory Visit: Payer: Self-pay

## 2019-04-09 VITALS — BP 166/94 | HR 89 | Temp 98.0°F | Resp 18 | Ht 74.0 in | Wt 174.6 lb

## 2019-04-09 DIAGNOSIS — E119 Type 2 diabetes mellitus without complications: Secondary | ICD-10-CM | POA: Diagnosis not present

## 2019-04-09 DIAGNOSIS — Z794 Long term (current) use of insulin: Secondary | ICD-10-CM | POA: Diagnosis not present

## 2019-04-09 DIAGNOSIS — C182 Malignant neoplasm of ascending colon: Secondary | ICD-10-CM | POA: Insufficient documentation

## 2019-04-09 LAB — CEA (IN HOUSE-CHCC): CEA (CHCC-In House): 4.96 ng/mL (ref 0.00–5.00)

## 2019-04-09 NOTE — Progress Notes (Addendum)
  [facility] OFFICE PROGRESS NOTE   Diagnosis: Colon cancer  INTERVAL HISTORY:   Greg Robertson returns as scheduled.  He feels well.  No change in bowel habits.  No rectal bleeding.  No abdominal pain.  He has a good appetite.  He has not scheduled surveillance upper and lower endoscopies due to excessive coughing after the most recent upper endoscopy.  Objective:  Vital signs in last 24 hours:  Blood pressure (!) 166/94, pulse 89, temperature 98 F (36.7 C), temperature source Temporal, resp. rate 18, height '6\' 2"'$  (1.88 m), weight 174 lb 9.6 oz (79.2 kg), SpO2 100 %.    HEENT: Neck without mass. Lymphatics: No palpable cervical, supraclavicular, axillary or inguinal lymph nodes. GI: Abdomen soft and nontender.  No hepatomegaly.  No mass. Vascular: No leg edema.   Lab Results:  Lab Results  Component Value Date   WBC 5.9 05/24/2018   HGB 14.6 05/24/2018   HCT 43.9 05/24/2018   MCV 93 05/24/2018   PLT 167 05/24/2018   NEUTROABS 3.3 05/24/2018    Imaging:  No results found.  Medications: I have reviewed the patient's current medications.  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 Tumor microsatellite unstable, K-ras wild type. Adjuvant FOLFOX chemotherapy initiated on 07/14/2011. Cycle 12 given on 01/05/2012.  -Negative surveillance colonoscopy 06/06/2012  -Negative surveillance CT scans 04/25/2013 -Negative surveillance CT scans 07/21/2014 -Negative surveillance colonoscopy 09/10/2014 -Negative surveillance colonoscopy 01/16/2018 2. Microcytic anemia. Likely secondary to iron deficiency. Resolved. 3. Insulin-dependent diabetes. 4. Glaucoma. 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 He has seen a Actuary. He has been contacted with updated information regarding screening recommendations. 6. Status post Port-A-Cath placement 07/01/2011. The Port-A-Cath has been removed. 7. History of thrombocytopenia secondary to chemotherapy. Oxaliplatin was held with cycle 3. Oxaliplatin was resumed with cycle 4 at a 25% dose reduction. Oxaliplatin was further dose reduced beginning with cycle 6 due to cytopenias. The oxaliplatin was held with cycle 8, cycle 10 and cycle 11. 8. History of neutropenia secondary to chemotherapy. 9. Chronic mild elevation of the CEA 10. Upper endoscopy 01/16/2018-normal esophagus;  mild gastritis; 1 duodenal polyp (tubular adenoma); distal stomach biopsy with antral and corpus mucosa with slight chronic inflammation, negative for H. Pylori,  no intestinal metaplasia dysplasia or malignancy.   Disposition: Greg Robertson remains in clinical remission from colon cancer.  We will follow-up on the CEA from today.  He is hesitant to schedule surveillance endoscopies due to excessive/prolonged coughing after the procedure October 2019.  I encouraged him to contact Dr. Ardis Hughs to discuss this further.  He will return for a CEA and follow-up visit in 6 months.  Patient seen with Dr. Benay Spice.    Ned Card ANP/GNP-BC   04/09/2019  12:11 PM  This was a shared visit with Ned Card.  Greg Robertson is in remission from colon cancer.  We encouraged him to follow-up Dr. Ardis Hughs for endoscopic surveillance.  Julieanne Manson, MD

## 2019-04-09 NOTE — Telephone Encounter (Signed)
Scheduled per 12/22 los, patient received after visit summary and calender.

## 2019-04-09 NOTE — Telephone Encounter (Signed)
TC to pt per Ned Card NP. Unable to teach him so left voicemail since he said it is okay to leave a message if he does not answer the phone. I let him know that per Ned Card NP his  CEA is in normal range. Follow-up as scheduled.

## 2019-04-26 ENCOUNTER — Other Ambulatory Visit: Payer: Self-pay

## 2019-04-29 ENCOUNTER — Ambulatory Visit: Payer: Medicare Other | Admitting: Family Medicine

## 2019-04-29 ENCOUNTER — Telehealth: Payer: Self-pay | Admitting: Family Medicine

## 2019-04-30 NOTE — Telephone Encounter (Signed)
lmtcb

## 2019-05-03 NOTE — Telephone Encounter (Signed)
Patient has a follow up appointment scheduled for march 5 th. He will need to call back for any other appointments.

## 2019-05-10 ENCOUNTER — Other Ambulatory Visit: Payer: Self-pay | Admitting: Family Medicine

## 2019-05-23 DIAGNOSIS — Z23 Encounter for immunization: Secondary | ICD-10-CM | POA: Diagnosis not present

## 2019-06-13 ENCOUNTER — Ambulatory Visit: Payer: Medicare Other

## 2019-06-21 ENCOUNTER — Ambulatory Visit: Payer: Medicare Other | Admitting: Family Medicine

## 2019-06-21 DIAGNOSIS — Z23 Encounter for immunization: Secondary | ICD-10-CM | POA: Diagnosis not present

## 2019-07-01 ENCOUNTER — Telehealth: Payer: Self-pay | Admitting: Family Medicine

## 2019-07-01 NOTE — Chronic Care Management (AMB) (Signed)
°  Chronic Care Management   Outreach Note  07/01/2019 Name: Greg Robertson MRN: [id number] DOB: 09/01/1950  Greg Robertson is a 68 y.o. year old male who is a primary care patient of Dettinger, Fransisca Kaufmann, MD. I reached out to Greg Robertson by phone today in response to a referral sent by Greg Robertson's health plan.     An unsuccessful telephone outreach was attempted today. The patient was referred to the case management team for assistance with care management and care coordination.   Follow Up Plan: A HIPPA compliant phone message was left for the patient providing contact information and requesting a return call.  The care management team will reach out to the patient again over the next 7 days.  If patient returns call to provider office, please advise to call Greg Robertson, Greg Robertson, Greg Robertson, Greg Robertson [id number] Direct Dial: [phone] [email or web address]: [email or web address]

## 2019-07-05 NOTE — Chronic Care Management (AMB) (Signed)
  Chronic Care Management   Outreach Note  07/05/2019 Name: Greg Robertson MRN: [id number] DOB: 1950-06-13  Greg Robertson is a 69 y.o. year old male who is a primary care patient of Dettinger, Fransisca Kaufmann, MD. I reached out to Greg Robertson by phone today in response to a referral sent by Greg Robertson's health plan.     A second unsuccessful telephone outreach was attempted today. The patient was referred to the case management team for assistance with care management and care coordination.   Follow Up Plan: A HIPPA compliant phone message was left for the patient providing contact information and requesting a return call.  The care management team will reach out to the patient again over the next 7 days.  If patient returns call to provider office, please advise to call [facility]  at [place], [place], [place], [place] [address] Direct Dial: [phone] [email or web address] Website: [email or web address]

## 2019-07-08 NOTE — Chronic Care Management (AMB) (Signed)
  Chronic Care Management   Outreach Note  07/08/2019 Name: EDEL KOVEN MRN: [id number] DOB: [birth year]  PRAGYAN CRITELLI is a 69 y.o. year old male who is a primary care patient of Dettinger, Fransisca Kaufmann, MD. I reached out to [name] by phone today in response to a referral sent by Mr. Octavia Heir Heyne's health plan.     Third unsuccessful telephone outreach was attempted today. The patient was referred to the case management team for assistance with care management and care coordination. The patient's primary care provider has been notified of our unsuccessful attempts to make or maintain contact with the patient. The care management team is pleased to engage with this patient at any time in the future should he/she be interested in assistance from the care management team.   Follow Up Plan: A HIPPA compliant phone message was left for the patient providing contact information and requesting a return call.  The care management team is available to follow up with the patient after provider conversation with the patient regarding recommendation for care management engagement and subsequent re-referral to the care management team.   Noreene Larsson, [place], [place], [place] [address] Direct Dial: [phone] [email or web address] Website: [email or web address]

## 2019-07-12 ENCOUNTER — Ambulatory Visit (INDEPENDENT_AMBULATORY_CARE_PROVIDER_SITE_OTHER): Payer: Medicare Other | Admitting: Family Medicine

## 2019-07-12 ENCOUNTER — Encounter: Payer: Self-pay | Admitting: Family Medicine

## 2019-07-12 ENCOUNTER — Other Ambulatory Visit: Payer: Self-pay

## 2019-07-12 VITALS — BP 147/80 | HR 98 | Temp 98.0°F | Ht 74.0 in | Wt 173.0 lb

## 2019-07-12 DIAGNOSIS — E1159 Type 2 diabetes mellitus with other circulatory complications: Secondary | ICD-10-CM | POA: Diagnosis not present

## 2019-07-12 DIAGNOSIS — E1065 Type 1 diabetes mellitus with hyperglycemia: Secondary | ICD-10-CM

## 2019-07-12 DIAGNOSIS — E1069 Type 1 diabetes mellitus with other specified complication: Secondary | ICD-10-CM

## 2019-07-12 DIAGNOSIS — N400 Enlarged prostate without lower urinary tract symptoms: Secondary | ICD-10-CM | POA: Diagnosis not present

## 2019-07-12 DIAGNOSIS — I10 Essential (primary) hypertension: Secondary | ICD-10-CM

## 2019-07-12 DIAGNOSIS — E785 Hyperlipidemia, unspecified: Secondary | ICD-10-CM

## 2019-07-12 DIAGNOSIS — I152 Hypertension secondary to endocrine disorders: Secondary | ICD-10-CM

## 2019-07-12 LAB — BAYER DCA HB A1C WAIVED: HB A1C (BAYER DCA - WAIVED): 7.3 % — ABNORMAL HIGH (ref <7.0)

## 2019-07-12 NOTE — Progress Notes (Signed)
BP (!) 147/80   Pulse 98   Temp 98 F (36.7 C)   Ht 6' 2" (1.88 m)   Wt 173 lb (78.5 kg)   SpO2 99%   BMI 22.21 kg/m    Subjective:   Patient ID: Greg Robertson, male    DOB: [birth year], 69 y.o.   MRN: [id number]  HPI: Greg Robertson is a 69 y.o. male presenting on 07/12/2019 for Medical Management of Chronic Issues and Diabetes   HPI Type 1 diabetes mellitus Patient comes in today for recheck of his diabetes. Patient has been currently taking Tyler Aas, has been going between 16 and 18 units and having some lows when he is at 17 or 18, will keep Tresiba at 16 units and he has been doing NovoLog between 1 unit per 8 g and 1 unit per 10 g, recommended he go to 1 unit per 6 g.. Patient is currently on an ACE inhibitor/ARB. Patient has seen an ophthalmologist this year. Patient denies any issues with their feet.   Hypertension Patient is currently on lisinopril 10 mg, and their blood pressure today is 147/80. Patient denies any lightheadedness or dizziness. Patient denies headaches, blurred vision, chest pains, shortness of breath, or weakness. Denies any side effects from medication and is content with current medication.   Hyperlipidemia Patient is coming in for recheck of his hyperlipidemia. The patient is currently taking simvastatin 40 mg. They deny any issues with myalgias or history of liver damage from it. They deny any focal numbness or weakness or chest pain.   Relevant past medical, surgical, family and social history reviewed and updated as indicated. Interim medical history since our last visit reviewed. Allergies and medications reviewed and updated.  Review of Systems  Constitutional: Negative for chills and fever.  Eyes: Negative for visual disturbance.  Respiratory: Negative for shortness of breath and wheezing.   Cardiovascular: Negative for chest pain and leg swelling.  Musculoskeletal: Negative for back pain and gait problem.  Skin: Negative for rash.   Neurological: Negative for dizziness, weakness and light-headedness.  All other systems reviewed and are negative.   Per HPI unless specifically indicated above   Allergies as of 07/12/2019      Reactions   Oxycodone Nausea And Vomiting      Medication List       Accurate as of July 12, 2019  1:39 PM. If you have any questions, ask your nurse or doctor.        STOP taking these medications   Vitamin D (Ergocalciferol) 1.25 MG (50000 UNIT) Caps capsule Commonly known as: DRISDOL Stopped by: Fransisca Kaufmann , MD     TAKE these medications   acetaminophen 500 MG tablet Commonly known as: TYLENOL Take 1,000 mg by mouth every 6 (six) hours as needed. Pain   glucose blood test strip Use as instructed   insulin aspart 100 UNIT/ML injection Commonly known as: NovoLOG INJECT 8-15 UNITS THREE TIMES DAILY BEFORE MEALS   levocetirizine 5 MG tablet Commonly known as: XYZAL Take 1 tablet (5 mg total) by mouth daily.   lisinopril 10 MG tablet Commonly known as: Zestril Take 1 tablet (10 mg total) by mouth daily.   multivitamin tablet Take 1 tablet by mouth every morning.   simvastatin 40 MG tablet Commonly known as: ZOCOR TAKE 1 TABLET BY MOUTH EVERYDAY AT BEDTIME   Tresiba FlexTouch 100 UNIT/ML FlexTouch Pen Generic drug: insulin degludec Inject 0.2 mLs (20 Units total) into the skin every morning.  triamcinolone 55 MCG/ACT Aero nasal inhaler Commonly known as: NASACORT INSTILL 1 SPRAY IN EACH NOSTRIL ONCE OR TWICE DAILY AS NEEDED.   VITAMIN C PO Take 1 tablet by mouth every morning.   Zioptan 0.0015 % Soln Generic drug: Tafluprost (PF) Apply to eye.        Objective:   BP (!) 147/80   Pulse 98   Temp 98 F (36.7 C)   Ht 6' 2" (1.88 m)   Wt 173 lb (78.5 kg)   SpO2 99%   BMI 22.21 kg/m   Wt Readings from Last 3 Encounters:  07/12/19 173 lb (78.5 kg)  04/09/19 174 lb 9.6 oz (79.2 kg)  01/25/19 168 lb (76.2 kg)    Physical Exam Vitals and  nursing note reviewed.  Constitutional:      General: He is not in acute distress.    Appearance: He is well-developed. He is not diaphoretic.  Eyes:     General: No scleral icterus.    Conjunctiva/sclera: Conjunctivae normal.  Neck:     Thyroid: No thyromegaly.  Cardiovascular:     Rate and Rhythm: Normal rate and regular rhythm.     Heart sounds: Normal heart sounds. No murmur.  Pulmonary:     Effort: Pulmonary effort is normal. No respiratory distress.     Breath sounds: Normal breath sounds. No wheezing.  Musculoskeletal:     Cervical back: Neck supple.  Lymphadenopathy:     Cervical: No cervical adenopathy.  Skin:    General: Skin is warm and dry.     Findings: No rash.  Neurological:     Mental Status: He is alert and oriented to person, place, and time.     Coordination: Coordination normal.  Psychiatric:        Behavior: Behavior normal.       Assessment & Plan:   Problem List Items Addressed This Visit      Cardiovascular and Mediastinum   Hypertension associated with diabetes ([place])   Relevant Orders   CBC with Differential/Platelet   CMP14+EGFR     Endocrine   DM (diabetes mellitus), type 1, uncontrolled ([place])   Relevant Orders   CBC with Differential/Platelet   Lipid panel   TSH   Hyperlipidemia due to type 1 diabetes mellitus ([place])   Relevant Orders   CBC with Differential/Platelet   Lipid panel     Genitourinary   Benign prostatic hyperplasia   Relevant Orders   CBC with Differential/Platelet   PSA, total and free    Other Visit Diagnoses    Type 1 diabetes mellitus with other specified complication ([place])    -  Primary   Relevant Orders   Bayer DCA Hb A1c Waived   CBC with Differential/Platelet   CMP14+EGFR      Patient is having a lot of low blood sugar so back off on the [place] to 16 units and recommended that he increase the amount of units per gram per meal to 1 unit per 6 g of carbs. Follow up plan: Return in about 3 months  (around 10/12/2019), or if symptoms worsen or fail to improve.  Counseling provided for all of the vaccine components Orders Placed This Encounter  Procedures  . Bayer DCA Hb A1c Waived  . CBC with Differential/Platelet  . CMP14+EGFR  . Lipid panel  . TSH  . PSA, total and free    Caryl Pina, MD [facility] 07/12/2019, 1:39 PM

## 2019-07-13 LAB — CMP14+EGFR
ALT: 16 IU/L (ref 0–44)
AST: 23 IU/L (ref 0–40)
Albumin/Globulin Ratio: 2.6 — ABNORMAL HIGH (ref 1.2–2.2)
Albumin: 4.1 g/dL (ref 3.8–4.8)
Alkaline Phosphatase: 86 IU/L (ref 39–117)
BUN/Creatinine Ratio: 14 (ref 10–24)
BUN: 16 mg/dL (ref 8–27)
Bilirubin Total: 0.7 mg/dL (ref 0.0–1.2)
CO2: 24 mmol/L (ref 20–29)
Calcium: 8.9 mg/dL (ref 8.6–10.2)
Chloride: 104 mmol/L (ref 96–106)
Creatinine, Ser: 1.13 mg/dL (ref 0.76–1.27)
GFR calc Af Amer: 77 mL/min/{1.73_m2} (ref >59)
GFR calc non Af Amer: 66 mL/min/{1.73_m2} (ref >59)
Globulin, Total: 1.6 g/dL (ref 1.5–4.5)
Glucose: 310 mg/dL — ABNORMAL HIGH (ref 65–99)
Potassium: 4.4 mmol/L (ref 3.5–5.2)
Sodium: 141 mmol/L (ref 134–144)
Total Protein: 5.7 g/dL — ABNORMAL LOW (ref 6.0–8.5)

## 2019-07-13 LAB — LIPID PANEL
Chol/HDL Ratio: 1.8 ratio (ref 0.0–5.0)
Cholesterol, Total: 220 mg/dL — ABNORMAL HIGH (ref 100–199)
HDL: 123 mg/dL (ref >39)
LDL Chol Calc (NIH): 71 mg/dL (ref 0–99)
Triglycerides: 165 mg/dL — ABNORMAL HIGH (ref 0–149)
VLDL Cholesterol Cal: 26 mg/dL (ref 5–40)

## 2019-07-13 LAB — PSA, TOTAL AND FREE
PSA, Free Pct: 15.2 %
PSA, Free: 0.44 ng/mL
Prostate Specific Ag, Serum: 2.9 ng/mL (ref 0.0–4.0)

## 2019-07-13 LAB — CBC WITH DIFFERENTIAL/PLATELET
Basophils Absolute: 0 10*3/uL (ref 0.0–0.2)
Basos: 0 %
EOS (ABSOLUTE): 0.1 10*3/uL (ref 0.0–0.4)
Eos: 1 %
Hematocrit: 40.7 % (ref 37.5–51.0)
Hemoglobin: 13.7 g/dL (ref 13.0–17.7)
Immature Grans (Abs): 0 10*3/uL (ref 0.0–0.1)
Immature Granulocytes: 0 %
Lymphocytes Absolute: 1.3 10*3/uL (ref 0.7–3.1)
Lymphs: 29 %
MCH: 31.1 pg (ref 26.6–33.0)
MCHC: 33.7 g/dL (ref 31.5–35.7)
MCV: 93 fL (ref 79–97)
Monocytes Absolute: 0.4 10*3/uL (ref 0.1–0.9)
Monocytes: 10 %
Neutrophils Absolute: 2.6 10*3/uL (ref 1.4–7.0)
Neutrophils: 60 %
Platelets: 108 10*3/uL — ABNORMAL LOW (ref 150–450)
RBC: 4.4 x10E6/uL (ref 4.14–5.80)
RDW: 12.8 % (ref 11.6–15.4)
WBC: 4.5 10*3/uL (ref 3.4–10.8)

## 2019-07-13 LAB — TSH: TSH: 1.68 u[IU]/mL (ref 0.450–4.500)

## 2019-08-05 ENCOUNTER — Other Ambulatory Visit: Payer: Self-pay | Admitting: Family Medicine

## 2019-10-04 ENCOUNTER — Encounter: Payer: Self-pay | Admitting: Family Medicine

## 2019-10-04 ENCOUNTER — Other Ambulatory Visit: Payer: Self-pay

## 2019-10-04 ENCOUNTER — Ambulatory Visit (INDEPENDENT_AMBULATORY_CARE_PROVIDER_SITE_OTHER): Payer: Medicare Other | Admitting: Family Medicine

## 2019-10-04 VITALS — BP 138/74 | HR 81 | Temp 97.7°F | Ht 74.0 in | Wt 177.5 lb

## 2019-10-04 DIAGNOSIS — I10 Essential (primary) hypertension: Secondary | ICD-10-CM | POA: Diagnosis not present

## 2019-10-04 DIAGNOSIS — E1159 Type 2 diabetes mellitus with other circulatory complications: Secondary | ICD-10-CM

## 2019-10-04 DIAGNOSIS — J410 Simple chronic bronchitis: Secondary | ICD-10-CM

## 2019-10-04 DIAGNOSIS — E1069 Type 1 diabetes mellitus with other specified complication: Secondary | ICD-10-CM

## 2019-10-04 DIAGNOSIS — E785 Hyperlipidemia, unspecified: Secondary | ICD-10-CM | POA: Diagnosis not present

## 2019-10-04 DIAGNOSIS — I5032 Chronic diastolic (congestive) heart failure: Secondary | ICD-10-CM

## 2019-10-04 LAB — BAYER DCA HB A1C WAIVED: HB A1C (BAYER DCA - WAIVED): 8.2 % — ABNORMAL HIGH (ref <7.0)

## 2019-10-04 NOTE — Progress Notes (Signed)
BP 138/74   Pulse 81   Temp 97.7 F (36.5 C)   Ht 6\' 2"  (1.88 m)   Wt 177 lb 8 oz (80.5 kg)   SpO2 99%   BMI 22.79 kg/m    Subjective:   Patient ID: Greg Robertson, male    DOB: 05-01-1950, 69 y.o.   MRN: [id number]  HPI: Greg Robertson is a 69 y.o. male presenting on 10/04/2019 for Medical Management of Chronic Issues and Diabetes   HPI Type 1 diabetes mellitus Patient comes in today for recheck of his diabetes. Patient has been currently takin with g Tresiba 14 units and NovoLog on a calorie and carb counting basis.  His A1c is 8.2 today and it seems like sometimes the carb counting is going very well but sometimes it is not, it sounds like we need more education for it. Patient is currently on an ACE inhibitor/ARB. Patient has not seen an ophthalmologist this year. Patient denies any issues with their feet. The symptom started onset as an adult hypertension and hyperlipidemia ARE RELATED TO DM  Hyperlipidemia Patient is coming in for recheck of his hyperlipidemia. The patient is currently taking simvastatin. They deny any issues with myalgias or history of liver damage from it. They deny any focal numbness or weakness or chest pain.   Hypertension Patient is currently on lisinopril, and their blood pressure today is 138/74. Patient denies any lightheadedness or dizziness. Patient denies headaches, blurred vision, chest pains, shortness of breath, or weakness. Denies any side effects from medication and is content with current medication.   Relevant past medical, surgical, family and social history reviewed and updated as indicated. Interim medical history since our last visit reviewed. Allergies and medications reviewed and updated.  Review of Systems  Constitutional: Negative for chills and fever.  Eyes: Negative for visual disturbance.  Respiratory: Negative for shortness of breath and wheezing.   Cardiovascular: Negative for chest pain and leg swelling.    Musculoskeletal: Negative for back pain and gait problem.  Skin: Negative for rash.  Neurological: Negative for dizziness, weakness and light-headedness.  All other systems reviewed and are negative.   Per HPI unless specifically indicated above   Allergies as of 10/04/2019      Reactions   Oxycodone Nausea And Vomiting      Medication List       Accurate as of October 04, 2019 11:10 AM. If you have any questions, ask your nurse or doctor.        acetaminophen 500 MG tablet Commonly known as: TYLENOL Take 1,000 mg by mouth every 6 (six) hours as needed. Pain   glucose blood test strip Use as instructed   insulin aspart 100 UNIT/ML injection Commonly known as: NovoLOG INJECT 8-15 UNITS THREE TIMES DAILY BEFORE MEALS   levocetirizine 5 MG tablet Commonly known as: XYZAL Take 1 tablet (5 mg total) by mouth daily.   lisinopril 10 MG tablet Commonly known as: Zestril Take 1 tablet (10 mg total) by mouth daily.   multivitamin tablet Take 1 tablet by mouth every morning.   simvastatin 40 MG tablet Commonly known as: ZOCOR TAKE 1 TABLET BY MOUTH EVERYDAY AT BEDTIME   Tresiba FlexTouch 100 UNIT/ML FlexTouch Pen Generic drug: insulin degludec Inject 0.2 mLs (20 Units total) into the skin every morning.   triamcinolone 55 MCG/ACT Aero nasal inhaler Commonly known as: NASACORT INSTILL 1 SPRAY IN EACH NOSTRIL ONCE OR TWICE DAILY AS NEEDED.   VITAMIN C PO Take  1 tablet by mouth every morning.   Zioptan 0.0015 % Soln Generic drug: Tafluprost (PF) Apply to eye.        Objective:   BP 138/74   Pulse 81   Temp 97.7 F (36.5 C)   Ht 6\' 2"  (1.88 m)   Wt 177 lb 8 oz (80.5 kg)   SpO2 99%   BMI 22.79 kg/m   Wt Readings from Last 3 Encounters:  10/04/19 177 lb 8 oz (80.5 kg)  07/12/19 173 lb (78.5 kg)  04/09/19 174 lb 9.6 oz (79.2 kg)    Physical Exam Vitals and nursing note reviewed.  Constitutional:      General: He is not in acute distress.     Appearance: He is well-developed. He is not diaphoretic.  Eyes:     General: No scleral icterus.    Conjunctiva/sclera: Conjunctivae normal.  Neck:     Thyroid: No thyromegaly.  Cardiovascular:     Rate and Rhythm: Normal rate and regular rhythm.     Heart sounds: Normal heart sounds. No murmur heard.   Pulmonary:     Effort: Pulmonary effort is normal. No respiratory distress.     Breath sounds: Normal breath sounds. No wheezing.  Musculoskeletal:        General: Normal range of motion.     Cervical back: Neck supple.  Lymphadenopathy:     Cervical: No cervical adenopathy.  Skin:    General: Skin is warm and dry.     Findings: No rash.  Neurological:     Mental Status: He is alert and oriented to person, place, and time.     Coordination: Coordination normal.  Psychiatric:        Behavior: Behavior normal.       Assessment & Plan:   Problem List Items Addressed This Visit      Cardiovascular and Mediastinum   Chronic diastolic heart failure, NYHA class 1 (HCC)   Hypertension associated with diabetes (HCC)     Respiratory   COPD (chronic obstructive pulmonary disease) (HCC)     Endocrine   DM type 1 (diabetes mellitus, type 1) ([place]) - Primary   Relevant Orders   Bayer DCA Hb A1c Waived   Hyperlipidemia due to type 1 diabetes mellitus ([place])      Discussed carb counting with the patient and said that he probably needs to increase his carb counting to 1 g per 4 units so that we can get more units to cover his carb counting, he will do this for lunch and dinner but keep the same carb counting he is doing for breakfast. Follow up plan: Return in about 3 months (around 01/04/2020), or if symptoms worsen or fail to improve, for type dm 1 .  Counseling provided for all of the vaccine components Orders Placed This Encounter  Procedures  . Bayer [facility] Hb A1c [place], MD [place] [facility] 10/04/2019, 11:10 AM

## 2019-10-08 ENCOUNTER — Inpatient Hospital Stay: Payer: Medicare Other | Admitting: Oncology

## 2019-10-08 ENCOUNTER — Inpatient Hospital Stay: Payer: Medicare Other

## 2019-10-08 ENCOUNTER — Telehealth: Payer: Self-pay | Admitting: Oncology

## 2019-10-08 NOTE — Telephone Encounter (Signed)
R/s appt per pt request. Pt is aware of appt time and date.

## 2019-10-14 ENCOUNTER — Inpatient Hospital Stay: Payer: Medicare Other | Attending: Oncology

## 2019-10-14 ENCOUNTER — Other Ambulatory Visit: Payer: Self-pay

## 2019-10-14 ENCOUNTER — Telehealth: Payer: Self-pay | Admitting: Emergency Medicine

## 2019-10-14 ENCOUNTER — Inpatient Hospital Stay (HOSPITAL_BASED_OUTPATIENT_CLINIC_OR_DEPARTMENT_OTHER): Payer: Medicare Other | Admitting: Oncology

## 2019-10-14 ENCOUNTER — Telehealth: Payer: Self-pay | Admitting: Oncology

## 2019-10-14 VITALS — BP 171/90 | HR 95 | Temp 97.5°F | Resp 20 | Ht 74.0 in | Wt 175.1 lb

## 2019-10-14 DIAGNOSIS — E119 Type 2 diabetes mellitus without complications: Secondary | ICD-10-CM | POA: Diagnosis not present

## 2019-10-14 DIAGNOSIS — C182 Malignant neoplasm of ascending colon: Secondary | ICD-10-CM | POA: Insufficient documentation

## 2019-10-14 DIAGNOSIS — Z794 Long term (current) use of insulin: Secondary | ICD-10-CM | POA: Diagnosis not present

## 2019-10-14 LAB — CEA (IN HOUSE-CHCC): CEA (CHCC-In House): 5.27 ng/mL — ABNORMAL HIGH (ref 0.00–5.00)

## 2019-10-14 NOTE — Telephone Encounter (Addendum)
VM left for pt to call back for results.   ----- Message from Ladell Pier, MD sent at 10/14/2019  1:47 PM EDT ----- Please call patient, the CEA is stable, follow-up as scheduled  Patient called back and was given stable CEA results.

## 2019-10-14 NOTE — Telephone Encounter (Signed)
Scheduled per 06/28 los, patient received calender.

## 2019-10-14 NOTE — Progress Notes (Signed)
  Greg Robertson   Diagnosis: Colon cancer  INTERVAL HISTORY:   Greg Robertson returns as scheduled.  He feels well.  Good appetite.  No complaint.  He is due for an upper endoscopy and colonoscopy.  No neuropathy symptoms.  Objective:  Vital signs in last 24 hours:  Blood pressure (!) 171/90, pulse 95, temperature (!) 97.5 F (36.4 C), temperature source Temporal, resp. rate 20, height '6\' 2"'$  (1.88 m), weight 175 lb 1.6 oz (79.4 kg), SpO2 99 %.    Lymphatics: No cervical, supraclavicular, axillary, or inguinal nodes Resp: Lungs clear bilaterally Cardio: Regular rate and rhythm GI: No hepatosplenomegaly, no mass, nontender Vascular: No leg edema   Lab Results:  Lab Results  Component Value Date   WBC 4.5 07/12/2019   HGB 13.7 07/12/2019   HCT 40.7 07/12/2019   MCV 93 07/12/2019   PLT 108 (L) 07/12/2019   NEUTROABS 2.6 07/12/2019    CMP  Lab Results  Component Value Date   NA 141 07/12/2019   K 4.4 07/12/2019   CL 104 07/12/2019   CO2 24 07/12/2019   GLUCOSE 310 (H) 07/12/2019   BUN 16 07/12/2019   CREATININE 1.13 07/12/2019   CALCIUM 8.9 07/12/2019   PROT 5.7 (L) 07/12/2019   ALBUMIN 4.1 07/12/2019   AST 23 07/12/2019   ALT 16 07/12/2019   ALKPHOS 86 07/12/2019   BILITOT 0.7 07/12/2019   GFRNONAA 66 07/12/2019   GFRAA 77 07/12/2019    Lab Results  Component Value Date   CEA1 4.96 04/09/2019     Medications: I have reviewed the patient's current medications.   Assessment/Plan: 1. 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 Tumor microsatellite unstable, K-ras wild type. Adjuvant FOLFOX chemotherapy initiated on 07/14/2011. Cycle 12 given on 01/05/2012.  -Negative surveillance colonoscopy 06/06/2012  -Negative surveillance CT scans 04/25/2013 -Negative surveillance CT scans 07/21/2014 -Negative surveillance colonoscopy  09/10/2014 -Negative surveillance colonoscopy 01/16/2018 2. Microcytic anemia. Likely secondary to iron deficiency. Resolved. 3. Insulin-dependent diabetes. 4. Glaucoma. 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 He has seen a Retail buyer. He has been contacted with updated information regarding screening recommendations. 6. Status post Port-A-Cath placement 07/01/2011. The Port-A-Cath has been removed. 7. History of thrombocytopenia secondary to chemotherapy. Oxaliplatin was held with cycle 3. Oxaliplatin was resumed with cycle 4 at a 25% dose reduction. Oxaliplatin was further dose reduced beginning with cycle 6 due to cytopenias. The oxaliplatin was held with cycle 8, cycle 10 and cycle 11. 8. History of neutropenia secondary to chemotherapy. 9. Chronic mild elevation of the CEA 10. Upper endoscopy 01/16/2018-normal esophagus;  mild gastritis; 1 duodenal polyp (tubular adenoma); distal stomach biopsy with antral and corpus mucosa with slight chronic inflammation, negative for H. Pylori,  no intestinal metaplasia dysplasia or malignancy.     Disposition: Greg Robertson is in remission from colon cancer.  We will follow up on the CEA from today.  The CEA has been elevated chronically.  He will return for an office visit in 1 year.  I encouraged him to schedule a follow-up endoscopic evaluation with Dr. Ardis Hughs.  Betsy Coder, MD  10/14/2019  11:04 AM

## 2019-11-15 ENCOUNTER — Ambulatory Visit (INDEPENDENT_AMBULATORY_CARE_PROVIDER_SITE_OTHER): Payer: Medicare Other | Admitting: *Deleted

## 2019-11-15 DIAGNOSIS — Z0000 Encounter for general adult medical examination without abnormal findings: Secondary | ICD-10-CM | POA: Diagnosis not present

## 2019-11-15 NOTE — Progress Notes (Addendum)
MEDICARE ANNUAL WELLNESS VISIT  11/15/2019  Telephone Visit Disclaimer This Medicare AWV was conducted by telephone due to national recommendations for restrictions regarding the COVID-19 Pandemic (e.g. social distancing).  I verified, using two identifiers, that I am speaking with Greg Robertson or their authorized healthcare agent. I discussed the limitations, risks, security, and privacy concerns of performing an evaluation and management service by telephone and the potential availability of an in-person appointment in the future. The patient expressed understanding and agreed to proceed.   Subjective:  Greg Robertson is a 69 y.o. male patient of Dettinger, Fransisca Kaufmann, MD who had a Medicare Annual Wellness Visit today via telephone. Greg Robertson is Retired and lives alone. he has 0 children. he reports that he is socially active and does interact with friends/family regularly. he is moderately physically active and enjoys flying remote control planes and reading.  Patient Care Team: Dettinger, Fransisca Kaufmann, MD as PCP - General (Family Medicine) Ladell Pier, MD as Consulting Physician (Oncology) Minus Breeding, MD as Consulting Physician (Cardiology) [facility] Milus Banister, MD as Attending Physician (Gastroenterology)  Advanced Directives 11/15/2019 10/14/2019 10/02/2018 09/05/2017 07/09/2015 01/09/2015 08/27/2014  Does Patient Have a Medical Advance Directive? Yes Yes No Yes No No Yes  Type of Advance Directive Living will [place];Living will - [place];Living will - - [place];Living will  Does patient want to make changes to medical advance directive? No - Patient declined No - Patient declined - No - Patient declined - - No - Patient declined  Copy of Healthcare Power of Attorney in Chart? - No - copy requested - No - copy requested - - No - copy requested  Would patient like information on creating  a medical advance directive? - - Yes (MAU/Ambulatory/Procedural Areas - Information given) - No - patient declined information No - patient declined information -  Pre-existing out of facility DNR order (yellow form or pink MOST form) - - - - - - -    Hospital Utilization Over the Past 12 Months: # of hospitalizations or ER visits: 0 # of surgeries: 0  Review of Systems    Patient reports that his overall health is unchanged compared to last year.  History obtained from chart review and the patient  Patient Reported Readings (BP, Pulse, CBG, Weight, etc) none  Pain Assessment Pain : No/denies pain     Current Medications & Allergies (verified) Allergies as of 11/15/2019       Reactions   Oxycodone Nausea And Vomiting        Medication List        Accurate as of November 15, 2019  9:53 AM. If you have any questions, ask your nurse or doctor.          acetaminophen 500 MG tablet Commonly known as: TYLENOL Take 1,000 mg by mouth every 6 (six) hours as needed. Pain   glucose blood test strip Use as instructed   insulin aspart 100 UNIT/ML injection Commonly known as: NovoLOG INJECT 8-15 UNITS THREE TIMES DAILY BEFORE MEALS   levocetirizine 5 MG tablet Commonly known as: XYZAL Take 1 tablet (5 mg total) by mouth daily.   lisinopril 10 MG tablet Commonly known as: Zestril Take 1 tablet (10 mg total) by mouth daily.   multivitamin tablet Take 1 tablet by mouth every morning.   simvastatin 40 MG tablet Commonly known as: ZOCOR TAKE 1 TABLET BY MOUTH EVERYDAY  AT BEDTIME   Tresiba FlexTouch 100 UNIT/ML FlexTouch Pen Generic drug: insulin degludec Inject 0.2 mLs (20 Units total) into the skin every morning. What changed: how much to take   triamcinolone 55 MCG/ACT Aero nasal inhaler Commonly known as: NASACORT INSTILL 1 SPRAY IN EACH NOSTRIL ONCE OR TWICE DAILY AS NEEDED.   VITAMIN C PO Take 1 tablet by mouth every morning.   Zioptan 0.0015 % Soln Generic  drug: Tafluprost (PF) Place 1 drop into both eyes at bedtime.        History (reviewed): Past Medical History:  Diagnosis Date   Allergic rhinitis    Anemia    Atrial fibrillation ([place])    Persistent   Cancer of ascending colon, 7cm 05/25/2011   Diabetes mellitus type I ([place])    Glaucoma    Hypertension    Pneumonia 1979 or 1980   Prostate nodule    Substance abuse [facility])    Past Surgical History:  Procedure Laterality Date   broken left shoulder blade and collar bone     FINGER SURGERY  2009   right middle   LAPAROSCOPIC ASSISTED 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     NASAL FRACTURE SURGERY  1968   PORTACATH PLACEMENT  07/01/2011   Procedure: INSERTION PORT-A-CATH;  Surgeon: Adin Hector, MD;  Location: [facility];  Service: General;  Laterality: Left;  Insertion of Port-A-Catheter Left Internal Jugular   TONSILLECTOMY  1957 - approximate   Family History  Problem Relation Age of Onset   Colon polyps Father    Lung cancer Father    Diabetes Father    Prostate cancer Father    Breast cancer Mother    Pancreatitis Mother        intestinal adhesions   Insomnia Mother    Colon cancer Neg Hx    Rectal cancer Neg Hx    Social History   Socioeconomic History   Marital status: Single    Spouse name: Not on file   Number of children: 0   Years of education: 16   Highest education level: Bachelor's degree (e.g., BA, AB, BS)  Occupational History   Occupation: Retired    Comment: Press photographer  Tobacco Use   Smoking status: Former Smoker    Packs/day: 1.00    Years: 15.00    Pack years: 15.00    Types: Cigarettes    Quit date: 04/18/1993    Years since quitting: 26.5   Smokeless tobacco: Never Used   Tobacco comment: marijuana every night   Vaping Use   Vaping Use: Never used  Substance and Sexual Activity   Alcohol use: Yes    Alcohol/week: 5.0 standard drinks    Types: 3 Cans of beer, 2 Shots of liquor per week    Comment: 1 drink every 2 days   Drug use:  Yes    Types: Marijuana    Comment: once a night marijuana   Sexual activity: Not Currently  Other Topics Concern   Not on file  Social History Narrative   Lives alone.  Retired from [employer].    Social Determinants of Health   Financial Resource Strain: Low Risk    Difficulty of Paying Living Expenses: Not hard at all  Food Insecurity: No Food Insecurity   Worried About Charity fundraiser in the Last Year: Never true   [place] in the Last Year: Never true  Transportation Needs: No Transportation Needs   Lack of Transportation (Medical): No  Lack of Transportation (Non-Medical): No  Physical Activity: Sufficiently Active   Days of Exercise per Week: 5 days   Minutes of Exercise per Session: 40 min  Stress: No Stress Concern Present   Feeling of Stress : Only a little  Social Connections: Moderately Isolated   Frequency of Communication with Friends and Family: More than three times a week   Frequency of Social Gatherings with Friends and Family: Three times a week   Attends Religious Services: 1 to 4 times per year   Active Member of Clubs or Organizations: No   Attends Archivist Meetings: Never   Marital Status: Never married    Activities of Daily Living In your present state of health, do you have any difficulty performing the following activities: 11/15/2019  Hearing? N  Vision? N  Difficulty concentrating or making decisions? N  Walking or climbing stairs? N  Dressing or bathing? N  Doing errands, shopping? N  Preparing Food and eating ? N  Using the Toilet? N  In the past six months, have you accidently leaked urine? N  Do you have problems with loss of bowel control? N  Managing your Medications? N  Managing your Finances? N  Housekeeping or managing your Housekeeping? N  Some recent data might be hidden    Patient Education/ Literacy How often do you need to have someone help you when you read instructions, pamphlets, or other  written materials from your doctor or pharmacy?: 1 - Never What is the last grade level you completed in school?: Bachelors  Exercise Current Exercise Habits: Home exercise routine, Type of exercise: walking, Time (Minutes): 40, Frequency (Times/Week): 5, Weekly Exercise (Minutes/Week): 200, Intensity: Mild, Exercise limited by: None identified  Diet Patient reports consuming 3 meals a day and 1 snack(s) a day Patient reports that his primary diet is: Diabetic Patient reports that she does have regular access to food.   Depression Screen PHQ 2/9 Scores 11/15/2019 10/04/2019 07/12/2019 01/25/2019 12/05/2018 10/02/2018 05/24/2018  PHQ - 2 Score 0 0 0 0 0 0 0     Fall Risk Fall Risk  11/15/2019 10/04/2019 07/12/2019 01/25/2019 12/05/2018  Falls in the past year? 0 0 0 0 0     Objective:  Greg Robertson seemed alert and oriented and he participated appropriately during our telephone visit.  Blood Pressure Weight BMI  BP Readings from Last 3 Encounters:  10/14/19 (!) 171/90  10/04/19 138/74  07/12/19 (!) 147/80   Wt Readings from Last 3 Encounters:  10/14/19 175 lb 1.6 oz (79.4 kg)  10/04/19 177 lb 8 oz (80.5 kg)  07/12/19 173 lb (78.5 kg)   BMI Readings from Last 1 Encounters:  10/14/19 22.48 kg/m    *Unable to obtain current vital signs, weight, and BMI due to telephone visit type  Hearing/Vision  [place] did not seem to have difficulty with hearing/understanding during the telephone conversation Reports that he has had a formal eye exam by an eye care professional within the past year Reports that he has not had a formal hearing evaluation within the past year *Unable to fully assess hearing and vision during telephone visit type  Cognitive Function: 6CIT Screen 11/15/2019 10/02/2018  What Year? 0 points 0 points  What month? 0 points 0 points  What time? 0 points 0 points  Count back from 20 0 points 0 points  Months in reverse 0 points 0 points  Repeat phrase - 0 points    Total Score - 0   (  Normal:0-7, Significant for Dysfunction: >8)  Normal Cognitive Function Screening: Yes   Immunization & Health Maintenance Record Immunization History  Administered Date(s) Administered   Fluad Quad(high Dose 65+) 01/25/2019   Influenza Split 06/04/2011   Influenza, High Dose Seasonal PF 01/17/2018   Influenza,inj,Quad PF,6+ Mos 04/08/2013, 03/06/2014, 01/21/2015, 02/03/2016   Moderna SARS-COVID-2 Vaccination 05/23/2019, 06/21/2019   Pneumococcal Conjugate-13 01/21/2015   Pneumococcal Polysaccharide-23 06/04/2011, 01/25/2019   Tdap 07/23/2015    Health Maintenance  Topic Date Due   Hepatitis C Screening  12/05/2019 (Originally [birth year])   COLONOSCOPY  01/25/2020 (Originally 01/17/2019)   INFLUENZA VACCINE  11/17/2019   OPHTHALMOLOGY EXAM  12/03/2019   FOOT EXAM  01/25/2020   HEMOGLOBIN A1C  04/04/2020   TETANUS/TDAP  07/22/2025   COVID-19 Vaccine  Completed   PNA vac Low Risk Adult  Completed       Assessment  This is a routine wellness examination for Greg Robertson.  Health Maintenance: Due or Overdue There are no preventive care reminders to display for this patient.  Greg Robertson does not need a referral for Community Assistance: Care Management:   no Social Work:    no Prescription Assistance:  no Nutrition/Diabetes Education:  no   Plan:  Personalized Goals Goals Addressed               This Visit's Progress     Exercise 150 min/wk Moderate Activity   On track     HEMOGLOBIN A1C < 7 (pt-stated)        Pt would like to get A!C at 7 with meds, diet and exercise.       Personalized Health Maintenance & Screening Recommendations  shingles vaccine  Lung Cancer Screening Recommended: no (Low Dose CT Chest recommended if Age 12-80 years, 30 pack-year currently smoking OR have quit w/in past 15 years) Hepatitis C Screening recommended: no HIV Screening recommended: no  Advanced Directives: Written information was not  prepared per patient's request.  Referrals & Orders No orders of the defined types were placed in this encounter.   Follow-up Plan Follow-up with Dettinger, Fransisca Kaufmann, MD as planned on 01/06/20. Offer shingles vaccine on next visit. Pt is independent with all ADL's. Pt has a living will in place. Hearing is good and only needs reading glasses. Has eye exam yearly. Pt feels his health has not changed from last year at this time. AVS printed and mailed    I have personally reviewed and noted the following in the patient's chart:   Medical and social history Use of alcohol, tobacco or illicit drugs  Current medications and supplements Functional ability and status Nutritional status Physical activity Advanced directives List of other physicians Hospitalizations, surgeries, and ER visits in previous 12 months Vitals Screenings to include cognitive, depression, and falls Referrals and appointments  In addition, I have reviewed and discussed with Greg Robertson certain preventive protocols, quality metrics, and best practice recommendations. A written personalized care plan for preventive services as well as general preventive health recommendations is available and can be mailed to the patient at his request.      Rana Snare, LPN  [id number]   I have reviewed and agree with the above AWV documentation.   Evelina Dun, FNP

## 2019-12-08 ENCOUNTER — Other Ambulatory Visit: Payer: Self-pay | Admitting: Family Medicine

## 2020-01-06 ENCOUNTER — Ambulatory Visit: Payer: Medicare Other | Admitting: Family Medicine

## 2020-01-07 ENCOUNTER — Encounter: Payer: Self-pay | Admitting: Family Medicine

## 2020-01-08 NOTE — Progress Notes (Signed)
Patient: home Provider or nurse: in [place]

## 2020-01-10 ENCOUNTER — Other Ambulatory Visit: Payer: Self-pay | Admitting: Family Medicine

## 2020-02-10 ENCOUNTER — Telehealth: Payer: Self-pay

## 2020-02-10 NOTE — Telephone Encounter (Signed)
Patient lost Medicare insurance Talking to insurance companies ([place]) Gave patient options on short acting insulins--appears to cover humalog (will have to switch prescription)

## 2020-02-12 DIAGNOSIS — Z23 Encounter for immunization: Secondary | ICD-10-CM | POA: Diagnosis not present

## 2020-02-24 ENCOUNTER — Other Ambulatory Visit: Payer: Self-pay | Admitting: Family Medicine

## 2020-02-24 NOTE — Telephone Encounter (Signed)
Dettinger NTBS RFd 01/10/20 w/ NTBS note. Last OV 09/2019

## 2020-02-25 NOTE — Telephone Encounter (Signed)
Left detailed message for patient to call back to schedule an appointment for medication refill.

## 2020-03-06 ENCOUNTER — Telehealth: Payer: Self-pay | Admitting: *Deleted

## 2020-03-06 DIAGNOSIS — E1069 Type 1 diabetes mellitus with other specified complication: Secondary | ICD-10-CM

## 2020-03-06 DIAGNOSIS — I152 Hypertension secondary to endocrine disorders: Secondary | ICD-10-CM

## 2020-03-06 DIAGNOSIS — E1159 Type 2 diabetes mellitus with other circulatory complications: Secondary | ICD-10-CM

## 2020-03-06 MED ORDER — TRESIBA FLEXTOUCH 100 UNIT/ML ~~LOC~~ SOPN: 20.0000 [IU] | PEN_INJECTOR | SUBCUTANEOUS | 0 refills | Status: DC

## 2020-03-06 MED ORDER — INSULIN ASPART 100 UNIT/ML ~~LOC~~ SOLN: SUBCUTANEOUS | 0 refills | Status: DC

## 2020-03-06 MED ORDER — SIMVASTATIN 40 MG PO TABS: ORAL_TABLET | ORAL | 0 refills | Status: DC

## 2020-03-06 MED ORDER — LISINOPRIL 10 MG PO TABS: 10.0000 mg | ORAL_TABLET | Freq: Every day | ORAL | 0 refills | Status: DC

## 2020-03-06 NOTE — Telephone Encounter (Signed)
Pt called to find out why med refills were denied Pt last seen in June, was to RTC in September Appt made for 04/02/20, 30 d refill sent to pharmacy Pt aware

## 2020-03-31 ENCOUNTER — Other Ambulatory Visit: Payer: Self-pay | Admitting: Family Medicine

## 2020-04-02 ENCOUNTER — Encounter: Payer: Self-pay | Admitting: Family Medicine

## 2020-04-02 ENCOUNTER — Other Ambulatory Visit: Payer: Self-pay

## 2020-04-02 ENCOUNTER — Ambulatory Visit (INDEPENDENT_AMBULATORY_CARE_PROVIDER_SITE_OTHER): Payer: Medicare Other | Admitting: Family Medicine

## 2020-04-02 ENCOUNTER — Telehealth: Payer: Self-pay | Admitting: Pharmacist

## 2020-04-02 VITALS — BP 150/79 | HR 93 | Temp 98.0°F | Ht 74.0 in | Wt 176.0 lb

## 2020-04-02 DIAGNOSIS — I152 Hypertension secondary to endocrine disorders: Secondary | ICD-10-CM | POA: Diagnosis not present

## 2020-04-02 DIAGNOSIS — Z23 Encounter for immunization: Secondary | ICD-10-CM | POA: Diagnosis not present

## 2020-04-02 DIAGNOSIS — E785 Hyperlipidemia, unspecified: Secondary | ICD-10-CM | POA: Diagnosis not present

## 2020-04-02 DIAGNOSIS — E1159 Type 2 diabetes mellitus with other circulatory complications: Secondary | ICD-10-CM

## 2020-04-02 DIAGNOSIS — E1069 Type 1 diabetes mellitus with other specified complication: Secondary | ICD-10-CM | POA: Diagnosis not present

## 2020-04-02 LAB — BAYER DCA HB A1C WAIVED: HB A1C (BAYER DCA - WAIVED): 9.7 % — ABNORMAL HIGH (ref <7.0)

## 2020-04-02 MED ORDER — LEVOCETIRIZINE DIHYDROCHLORIDE 5 MG PO TABS: 5.0000 mg | ORAL_TABLET | Freq: Every day | ORAL | 3 refills | Status: DC

## 2020-04-02 MED ORDER — TRESIBA FLEXTOUCH 100 UNIT/ML ~~LOC~~ SOPN: 20.0000 [IU] | PEN_INJECTOR | SUBCUTANEOUS | 3 refills | Status: DC

## 2020-04-02 MED ORDER — SIMVASTATIN 40 MG PO TABS: ORAL_TABLET | ORAL | 3 refills | Status: DC

## 2020-04-02 MED ORDER — INSULIN ASPART 100 UNIT/ML ~~LOC~~ SOLN: SUBCUTANEOUS | 3 refills | Status: DC

## 2020-04-02 MED ORDER — LISINOPRIL 10 MG PO TABS: 10.0000 mg | ORAL_TABLET | Freq: Every day | ORAL | 3 refills | Status: DC

## 2020-04-02 NOTE — Progress Notes (Addendum)
BP (!) 150/79   Pulse 93   Temp 98 F (36.7 C)   Ht 6' 2"  (1.88 m)   Wt 176 lb (79.8 kg)   SpO2 100%   BMI 22.60 kg/m    Subjective:   Patient ID: Greg Robertson, male    DOB: 07-Aug-1950, 69 y.o.   MRN: [id number]  HPI: Greg Robertson is a 69 y.o. male presenting on 04/02/2020 for Medical Management of Chronic Issues, Diabetes, Hypertension, and Hyperlipidemia   HPI Type 1 diabetes mellitus Patient comes in today for recheck of his diabetes. Patient has been currently taking Tresiba 14 and carb counting for NovoLog. Patient is currently on an ACE inhibitor/ARB. Patient has seen an ophthalmologist this year. Patient denies any issues with their feet. The symptom started onset as an adult hypertension hyperlipidemia ARE RELATED TO DM   Hypertension Patient is currently on lisinopril, and their blood pressure today is 150/79. Patient denies any lightheadedness or dizziness. Patient denies headaches, blurred vision, chest pains, shortness of breath, or weakness. Denies any side effects from medication and is content with current medication.   Hyperlipidemia Patient is coming in for recheck of his hyperlipidemia. The patient is currently taking simvastatin. They deny any issues with myalgias or history of liver damage from it. They deny any focal numbness or weakness or chest pain.   Relevant past medical, surgical, family and social history reviewed and updated as indicated. Interim medical history since our last visit reviewed. Allergies and medications reviewed and updated.  Review of Systems  Constitutional: Negative for chills and fever.  Eyes: Negative for visual disturbance.  Respiratory: Negative for shortness of breath and wheezing.   Cardiovascular: Negative for chest pain and leg swelling.  Musculoskeletal: Negative for back pain and gait problem.  Skin: Negative for rash.  Neurological: Negative for dizziness, weakness and light-headedness.  All other systems  reviewed and are negative.   Per HPI unless specifically indicated above   Allergies as of 04/02/2020      Reactions   Oxycodone Nausea And Vomiting      Medication List       Accurate as of April 02, 2020 11:45 AM. If you have any questions, ask your nurse or doctor.        acetaminophen 500 MG tablet Commonly known as: TYLENOL Take 1,000 mg by mouth every 6 (six) hours as needed. Pain   glucose blood test strip Use as instructed   insulin aspart 100 UNIT/ML injection Commonly known as: NovoLOG INJECT 8-15 UNITS THREE TIMES DAILY BEFORE MEALS   levocetirizine 5 MG tablet Commonly known as: XYZAL Take 1 tablet (5 mg total) by mouth daily.   lisinopril 10 MG tablet Commonly known as: Zestril Take 1 tablet (10 mg total) by mouth daily.   multivitamin tablet Take 1 tablet by mouth every morning.   simvastatin 40 MG tablet Commonly known as: ZOCOR TAKE 1 TABLET BY MOUTH EVERYDAY AT BEDTIME   Tresiba FlexTouch 100 UNIT/ML FlexTouch Pen Generic drug: insulin degludec Inject 20 Units into the skin every morning.   triamcinolone 55 MCG/ACT Aero nasal inhaler Commonly known as: NASACORT INSTILL 1 SPRAY IN EACH NOSTRIL ONCE OR TWICE DAILY AS NEEDED.   VITAMIN C PO Take 1 tablet by mouth every morning.   Zioptan 0.0015 % Soln Generic drug: Tafluprost (PF) Place 1 drop into both eyes at bedtime.        Objective:   BP (!) 150/79   Pulse 93  Temp 98 F (36.7 C)   Ht 6' 2"  (1.88 m)   Wt 176 lb (79.8 kg)   SpO2 100%   BMI 22.60 kg/m   Wt Readings from Last 3 Encounters:  04/02/20 176 lb (79.8 kg)  10/14/19 175 lb 1.6 oz (79.4 kg)  10/04/19 177 lb 8 oz (80.5 kg)    Physical Exam Vitals and nursing note reviewed.  Constitutional:      General: He is not in acute distress.    Appearance: He is well-developed and well-nourished. He is not diaphoretic.  Eyes:     General: No scleral icterus.    Extraocular Movements: EOM normal.      Conjunctiva/sclera: Conjunctivae normal.  Neck:     Thyroid: No thyromegaly.  Cardiovascular:     Rate and Rhythm: Normal rate and regular rhythm.     Pulses: Intact distal pulses.     Heart sounds: Normal heart sounds. No murmur heard.   Pulmonary:     Effort: Pulmonary effort is normal. No respiratory distress.     Breath sounds: Normal breath sounds. No wheezing.  Musculoskeletal:        General: No edema. Normal range of motion.     Cervical back: Neck supple.  Lymphadenopathy:     Cervical: No cervical adenopathy.  Skin:    General: Skin is warm and dry.     Findings: No rash.  Neurological:     Mental Status: He is alert and oriented to person, place, and time.     Coordination: Coordination normal.  Psychiatric:        Mood and Affect: Mood and affect normal.        Behavior: Behavior normal.       Assessment & Plan:   Problem List Items Addressed This Visit      Cardiovascular and Mediastinum   Hypertension associated with diabetes ([place])   Relevant Medications   insulin aspart (NOVOLOG) 100 UNIT/ML injection   insulin degludec (TRESIBA FLEXTOUCH) 100 UNIT/ML FlexTouch Pen   lisinopril (ZESTRIL) 10 MG tablet   simvastatin (ZOCOR) 40 MG tablet   Other Relevant Orders   CMP14+EGFR     Endocrine   DM type 1 (diabetes mellitus, type 1) (HCC) - Primary   Relevant Medications   insulin aspart (NOVOLOG) 100 UNIT/ML injection   insulin degludec (TRESIBA FLEXTOUCH) 100 UNIT/ML FlexTouch Pen   lisinopril (ZESTRIL) 10 MG tablet   simvastatin (ZOCOR) 40 MG tablet   Other Relevant Orders   Bayer DCA Hb A1c Waived (Completed)   CBC with Differential/Platelet   CMP14+EGFR   Lipid panel   Hyperlipidemia due to type 1 diabetes mellitus (HCC)   Relevant Medications   insulin aspart (NOVOLOG) 100 UNIT/ML injection   insulin degludec (TRESIBA FLEXTOUCH) 100 UNIT/ML FlexTouch Pen   lisinopril (ZESTRIL) 10 MG tablet   simvastatin (ZOCOR) 40 MG tablet   Other Relevant  Orders   Lipid panel    Other Visit Diagnoses    Need for immunization against influenza       Relevant Orders   Flu Vaccine QUAD High Dose(Fluad) (Completed)      Patient's A1c has increased, we are going to try for CGM at 9.7 A1c is getting worse.  With that continuous meter we will have him come back and bring it with him so we can get a better assessment as to where he needs to be.   -patient is testing blood sugar 4-6 times daily -patient is injecting insulin at least  3 times daily -patient is making adjustments to insulin based on blood sugar readings -patient would benefit from CGM system (I.e. Libre or Dexcom)  Follow up plan: Return in about 3 months (around 07/01/2020), or if symptoms worsen or fail to improve, for Diabetes and hypertension and cholesterol..  Counseling provided for all of the vaccine components Orders Placed This Encounter  Procedures  . Flu Vaccine QUAD High Dose(Fluad)  . Bayer [facility] Hb A1c Coulter, MD [facility] 04/02/2020, 11:45 AM

## 2020-04-03 LAB — CBC WITH DIFFERENTIAL/PLATELET
Basophils Absolute: 0 10*3/uL (ref 0.0–0.2)
Basos: 0 %
EOS (ABSOLUTE): 0.1 10*3/uL (ref 0.0–0.4)
Eos: 1 %
Hematocrit: 39.4 % (ref 37.5–51.0)
Hemoglobin: 13.5 g/dL (ref 13.0–17.7)
Immature Grans (Abs): 0 10*3/uL (ref 0.0–0.1)
Immature Granulocytes: 0 %
Lymphocytes Absolute: 1.2 10*3/uL (ref 0.7–3.1)
Lymphs: 21 %
MCH: 30.7 pg (ref 26.6–33.0)
MCHC: 34.3 g/dL (ref 31.5–35.7)
MCV: 90 fL (ref 79–97)
Monocytes Absolute: 0.6 10*3/uL (ref 0.1–0.9)
Monocytes: 10 %
Neutrophils Absolute: 4 10*3/uL (ref 1.4–7.0)
Neutrophils: 68 %
Platelets: 169 10*3/uL (ref 150–450)
RBC: 4.4 x10E6/uL (ref 4.14–5.80)
RDW: 12.1 % (ref 11.6–15.4)
WBC: 5.9 10*3/uL (ref 3.4–10.8)

## 2020-04-03 LAB — CMP14+EGFR
ALT: 16 IU/L (ref 0–44)
AST: 22 IU/L (ref 0–40)
Albumin/Globulin Ratio: 2.1 (ref 1.2–2.2)
Albumin: 4.2 g/dL (ref 3.8–4.8)
Alkaline Phosphatase: 86 IU/L (ref 44–121)
BUN/Creatinine Ratio: 15 (ref 10–24)
BUN: 18 mg/dL (ref 8–27)
Bilirubin Total: 0.4 mg/dL (ref 0.0–1.2)
CO2: 25 mmol/L (ref 20–29)
Calcium: 9 mg/dL (ref 8.6–10.2)
Chloride: 103 mmol/L (ref 96–106)
Creatinine, Ser: 1.19 mg/dL (ref 0.76–1.27)
GFR calc Af Amer: 72 mL/min/{1.73_m2} (ref >59)
GFR calc non Af Amer: 62 mL/min/{1.73_m2} (ref >59)
Globulin, Total: 2 g/dL (ref 1.5–4.5)
Glucose: 183 mg/dL — ABNORMAL HIGH (ref 65–99)
Potassium: 4.2 mmol/L (ref 3.5–5.2)
Sodium: 144 mmol/L (ref 134–144)
Total Protein: 6.2 g/dL (ref 6.0–8.5)

## 2020-04-03 LAB — LIPID PANEL
Chol/HDL Ratio: 2.9 ratio (ref 0.0–5.0)
Cholesterol, Total: 256 mg/dL — ABNORMAL HIGH (ref 100–199)
HDL: 89 mg/dL (ref >39)
LDL Chol Calc (NIH): 138 mg/dL — ABNORMAL HIGH (ref 0–99)
Triglycerides: 168 mg/dL — ABNORMAL HIGH (ref 0–149)
VLDL Cholesterol Cal: 29 mg/dL (ref 5–40)

## 2020-04-08 NOTE — Telephone Encounter (Signed)
CGM (FREESTYLE LIBRE 2) PAPERWORK SENT TO [place] [phone] PHONE [phone]  PATIENT MUST CALL PHONE NUMBER OR ANSWER CALL TO RECEIVE SHIPMENT 3 MONTHS SHIPPED AT A TIME TO [place]

## 2020-04-28 ENCOUNTER — Telehealth: Payer: Self-pay

## 2020-04-28 NOTE — Telephone Encounter (Signed)
They are going to email julie

## 2020-06-03 ENCOUNTER — Ambulatory Visit: Payer: Medicare Other | Admitting: Family Medicine

## 2020-07-06 ENCOUNTER — Ambulatory Visit: Payer: Medicare Other | Admitting: Family Medicine

## 2020-08-06 ENCOUNTER — Encounter: Payer: Self-pay | Admitting: Family Medicine

## 2020-08-24 ENCOUNTER — Other Ambulatory Visit: Payer: Self-pay | Admitting: Family Medicine

## 2020-08-24 ENCOUNTER — Other Ambulatory Visit: Payer: Self-pay

## 2020-08-24 ENCOUNTER — Encounter: Payer: Self-pay | Admitting: Family Medicine

## 2020-08-24 ENCOUNTER — Ambulatory Visit (INDEPENDENT_AMBULATORY_CARE_PROVIDER_SITE_OTHER): Payer: Medicare Other | Admitting: Family Medicine

## 2020-08-24 VITALS — BP 149/94 | HR 98 | Temp 97.0°F | Ht 74.0 in | Wt 172.0 lb

## 2020-08-24 DIAGNOSIS — E785 Hyperlipidemia, unspecified: Secondary | ICD-10-CM

## 2020-08-24 DIAGNOSIS — I152 Hypertension secondary to endocrine disorders: Secondary | ICD-10-CM

## 2020-08-24 DIAGNOSIS — E1159 Type 2 diabetes mellitus with other circulatory complications: Secondary | ICD-10-CM | POA: Diagnosis not present

## 2020-08-24 DIAGNOSIS — E1069 Type 1 diabetes mellitus with other specified complication: Secondary | ICD-10-CM | POA: Diagnosis not present

## 2020-08-24 LAB — BAYER DCA HB A1C WAIVED: HB A1C (BAYER DCA - WAIVED): 8.5 % — ABNORMAL HIGH (ref <7.0)

## 2020-08-24 MED ORDER — FREESTYLE LIBRE 14 DAY READER DEVI: 1.0000 | Freq: Four times a day (QID) | 1 refills | Status: DC

## 2020-08-24 MED ORDER — FREESTYLE LIBRE 14 DAY SENSOR MISC: 1.0000 | 3 refills | Status: DC

## 2020-08-24 NOTE — Progress Notes (Signed)
BP (!) 149/94   Pulse 98   Temp (!) 97 F (36.1 C) (Temporal)   Ht 6' 2"  (1.88 m)   Wt 172 lb (78 kg)   BMI 22.08 kg/m    Subjective:   Patient ID: Greg Robertson, male    DOB: 05/19/1950, 70 y.o.   MRN: [id number]  HPI: Greg Robertson is a 70 y.o. male presenting on 08/24/2020 for Medical Management of Chronic Issues, Diabetes, Hyperlipidemia, and Hypertension   HPI Type 1 diabetes mellitus Patient comes in today for recheck of his diabetes. Patient has been currently taking patient admits that he has not been monitoring has not been checking as well.  He is doing the [place] but it varies very widely on what he takes depending on how he is feeling.  Says has had some lows but has not checked to see how low they were.  He says he had some highs.  His A1c today is 8.5.. Patient is currently on an ACE inhibitor/ARB. Patient has not seen an ophthalmologist this year. Patient denies any issues with their feet. The symptom started onset as an adult hyperlipidemia and hypertension ARE RELATED TO DM   Hyperlipidemia Patient is coming in for recheck of his hyperlipidemia. The patient is currently taking simvastatin. They deny any issues with myalgias or history of liver damage from it. They deny any focal numbness or weakness or chest pain.   Hypertension Patient is currently on lisinopril, and their blood pressure today is 149/94. Patient denies any lightheadedness or dizziness. Patient denies headaches, blurred vision, chest pains, shortness of breath, or weakness. Denies any side effects from medication and is content with current medication.   Relevant past medical, surgical, family and social history reviewed and updated as indicated. Interim medical history since our last visit reviewed. Allergies and medications reviewed and updated.  Review of Systems  Constitutional: Negative for chills and fever.  Eyes: Negative for visual disturbance.  Respiratory: Negative for shortness  of breath and wheezing.   Cardiovascular: Negative for chest pain and leg swelling.  Musculoskeletal: Negative for back pain and gait problem.  Skin: Negative for rash.  Neurological: Negative for dizziness, weakness and light-headedness.  All other systems reviewed and are negative.   Per HPI unless specifically indicated above   Allergies as of 08/24/2020      Reactions   Oxycodone Nausea And Vomiting      Medication List       Accurate as of Aug 24, 2020  3:17 PM. If you have any questions, ask your nurse or doctor.        acetaminophen 500 MG tablet Commonly known as: TYLENOL Take 1,000 mg by mouth every 6 (six) hours as needed. Pain   FreeStyle Libre 14 Day Reader Kerrin Mo 1 each by Does not apply route 4 (four) times daily. Started by: Worthy Rancher, MD   FreeStyle Libre 14 Day Sensor Misc 1 each by Does not apply route every 14 (fourteen) days. Started by: Fransisca Kaufmann Trevionne Advani, MD   glucose blood test strip Use as instructed   insulin aspart 100 UNIT/ML injection Commonly known as: NovoLOG INJECT 8-15 UNITS THREE TIMES DAILY BEFORE MEALS   levocetirizine 5 MG tablet Commonly known as: XYZAL Take 1 tablet (5 mg total) by mouth daily.   lisinopril 10 MG tablet Commonly known as: Zestril Take 1 tablet (10 mg total) by mouth daily.   multivitamin tablet Take 1 tablet by mouth every morning.   simvastatin  40 MG tablet Commonly known as: ZOCOR TAKE 1 TABLET BY MOUTH EVERYDAY AT BEDTIME   Tresiba FlexTouch 100 UNIT/ML FlexTouch Pen Generic drug: insulin degludec Inject 20 Units into the skin every morning.   triamcinolone 55 MCG/ACT Aero nasal inhaler Commonly known as: NASACORT INSTILL 1 SPRAY IN EACH NOSTRIL ONCE OR TWICE DAILY AS NEEDED.   VITAMIN C PO Take 1 tablet by mouth every morning.   Zioptan 0.0015 % Soln Generic drug: Tafluprost (PF) Place 1 drop into both eyes at bedtime.        Objective:   BP (!) 149/94   Pulse 98   Temp (!)  97 F (36.1 C) (Temporal)   Ht 6' 2"  (1.88 m)   Wt 172 lb (78 kg)   BMI 22.08 kg/m   Wt Readings from Last 3 Encounters:  08/24/20 172 lb (78 kg)  04/02/20 176 lb (79.8 kg)  10/14/19 175 lb 1.6 oz (79.4 kg)    Physical Exam Vitals and nursing note reviewed.  Constitutional:      General: He is not in acute distress.    Appearance: He is well-developed. He is not diaphoretic.  Eyes:     General: No scleral icterus.    Conjunctiva/sclera: Conjunctivae normal.  Neck:     Thyroid: No thyromegaly.  Cardiovascular:     Rate and Rhythm: Normal rate and regular rhythm.     Heart sounds: Normal heart sounds. No murmur heard.   Pulmonary:     Effort: Pulmonary effort is normal. No respiratory distress.     Breath sounds: Normal breath sounds. No wheezing.  Musculoskeletal:        General: Normal range of motion.     Cervical back: Neck supple.  Lymphadenopathy:     Cervical: No cervical adenopathy.  Skin:    General: Skin is warm and dry.     Findings: No rash.  Neurological:     Mental Status: He is alert and oriented to person, place, and time.     Coordination: Coordination normal.  Psychiatric:        Behavior: Behavior normal.       Assessment & Plan:   Problem List Items Addressed This Visit      Cardiovascular and Mediastinum   Hypertension associated with diabetes ([place])   Relevant Orders   CMP14+EGFR     Endocrine   DM type 1 (diabetes mellitus, type 1) ([place]) - Primary   Relevant Medications   Continuous Blood Gluc Sensor (FREESTYLE LIBRE 14 DAY SENSOR) MISC   Continuous Blood Gluc Receiver (FREESTYLE LIBRE 14 DAY READER) DEVI   Other Relevant Orders   CBC with Differential/Platelet   CMP14+EGFR   Bayer DCA Hb A1c Waived   Hyperlipidemia due to type 1 diabetes mellitus (HCC)   Relevant Orders   CMP14+EGFR   Lipid panel      A1c is slightly improved at 8.5 from 9.7.  Recent freestyle Elenor Legato so he checks and brings the meter with him so we have more  numbers.  Leave blood pressure for now, has been running okay the previous time but if due to elevated on We will increase his lisinopril. Follow up plan: Return in about 3 months (around 11/24/2020), or if symptoms worsen or fail to improve, for Make appointment with Almyra Free in the next couple weeks if patient well and diabetes recheck in 3 mont.  Counseling provided for all of the vaccine components Orders Placed This Encounter  Procedures  . CBC with Differential/Platelet  .  CMP14+EGFR  . Lipid panel  . Bayer [facility] Hb A1c Waived    Caryl Pina, MD [facility] 08/24/2020, 3:17 PM

## 2020-08-25 ENCOUNTER — Other Ambulatory Visit: Payer: Self-pay | Admitting: Family Medicine

## 2020-08-25 DIAGNOSIS — E1069 Type 1 diabetes mellitus with other specified complication: Secondary | ICD-10-CM

## 2020-08-25 LAB — CBC WITH DIFFERENTIAL/PLATELET
Basophils Absolute: 0 10*3/uL (ref 0.0–0.2)
Basos: 1 %
EOS (ABSOLUTE): 0.1 10*3/uL (ref 0.0–0.4)
Eos: 1 %
Hematocrit: 46.4 % (ref 37.5–51.0)
Hemoglobin: 15.1 g/dL (ref 13.0–17.7)
Immature Grans (Abs): 0 10*3/uL (ref 0.0–0.1)
Immature Granulocytes: 0 %
Lymphocytes Absolute: 1.7 10*3/uL (ref 0.7–3.1)
Lymphs: 33 %
MCH: 31.1 pg (ref 26.6–33.0)
MCHC: 32.5 g/dL (ref 31.5–35.7)
MCV: 96 fL (ref 79–97)
Monocytes Absolute: 0.5 10*3/uL (ref 0.1–0.9)
Monocytes: 10 %
Neutrophils Absolute: 2.9 10*3/uL (ref 1.4–7.0)
Neutrophils: 55 %
Platelets: 117 10*3/uL — ABNORMAL LOW (ref 150–450)
RBC: 4.86 x10E6/uL (ref 4.14–5.80)
RDW: 11.8 % (ref 11.6–15.4)
WBC: 5.2 10*3/uL (ref 3.4–10.8)

## 2020-08-25 LAB — LIPID PANEL
Chol/HDL Ratio: 2 ratio (ref 0.0–5.0)
Cholesterol, Total: 213 mg/dL — ABNORMAL HIGH (ref 100–199)
HDL: 108 mg/dL (ref >39)
LDL Chol Calc (NIH): 84 mg/dL (ref 0–99)
Triglycerides: 127 mg/dL (ref 0–149)
VLDL Cholesterol Cal: 21 mg/dL (ref 5–40)

## 2020-08-25 LAB — CMP14+EGFR
ALT: 19 IU/L (ref 0–44)
AST: 23 IU/L (ref 0–40)
Albumin/Globulin Ratio: 2 (ref 1.2–2.2)
Albumin: 4 g/dL (ref 3.8–4.8)
Alkaline Phosphatase: 146 IU/L — ABNORMAL HIGH (ref 44–121)
BUN/Creatinine Ratio: 14 (ref 10–24)
BUN: 21 mg/dL (ref 8–27)
Bilirubin Total: 0.3 mg/dL (ref 0.0–1.2)
CO2: 22 mmol/L (ref 20–29)
Calcium: 9 mg/dL (ref 8.6–10.2)
Chloride: 99 mmol/L (ref 96–106)
Creatinine, Ser: 1.48 mg/dL — ABNORMAL HIGH (ref 0.76–1.27)
Globulin, Total: 2 g/dL (ref 1.5–4.5)
Glucose: 479 mg/dL (ref 65–99)
Potassium: 5.6 mmol/L — ABNORMAL HIGH (ref 3.5–5.2)
Sodium: 138 mmol/L (ref 134–144)
Total Protein: 6 g/dL (ref 6.0–8.5)
eGFR: 51 mL/min/{1.73_m2} — ABNORMAL LOW (ref >59)

## 2020-08-25 MED ORDER — FREESTYLE LIBRE 2 SENSOR MISC: 3 refills | Status: DC

## 2020-09-09 ENCOUNTER — Encounter: Payer: Self-pay | Admitting: *Deleted

## 2020-09-15 ENCOUNTER — Other Ambulatory Visit: Payer: Self-pay

## 2020-09-15 ENCOUNTER — Ambulatory Visit (INDEPENDENT_AMBULATORY_CARE_PROVIDER_SITE_OTHER): Payer: Medicare Other | Admitting: Pharmacist

## 2020-09-15 DIAGNOSIS — E1069 Type 1 diabetes mellitus with other specified complication: Secondary | ICD-10-CM

## 2020-09-15 NOTE — Progress Notes (Signed)
    09/15/2020 Name: Greg Robertson MRN: [id number] DOB: 1950/08/30   S:  49 yoM Presents for diabetes evaluation, education, and management Patient was referred and last seen by Primary Care Provider on 08/24/20.  He has been a Type 1 diabetic for 44 yrs.  He is here today to get set up with CGM.  Insurance coverage/medication affordability: aetna medicare  Patient reports adherence with medications. . Current diabetes medications include: tresiba, novolog . Current hypertension medications include: lisinoprol Goal 130/80 . Current hyperlipidemia medications include: simvastatin   Patient reports hypoglycemic events.   Patient reported dietary habits: Eats 3 meals/day Discussed meal planning options and Plate method for healthy eating . Avoid sugary drinks and desserts . Incorporate balanced protein, non starchy veggies, 1 serving of carbohydrate with each meal . Increase water intake . Increase physical activity as able  Patient does carb count to determine meal time dose-he has been a type 1 diabetic for 44 yrs  Patient-reported exercise habits: walking but allergies have been acting up    O:  Lab Results  Component Value Date   HGBA1C 8.5 (H) 08/24/2020      Lipid Panel     Component Value Date/Time   CHOL 213 (H) 08/24/2020 1540   CHOL 220 (H) 10/29/2012 1729   TRIG 127 08/24/2020 1540   TRIG 200 (H) 01/21/2015 1051   TRIG 179 (H) 10/29/2012 1729   HDL 108 08/24/2020 1540   HDL 63 01/21/2015 1051   HDL 68 10/29/2012 1729   CHOLHDL 2.0 08/24/2020 1540   LDLCALC 84 08/24/2020 1540   LDLCALC 116 (H) 10/29/2012 1729    Home fasting blood sugars: 70-90s  2 hour post-meal/random blood sugars: <200.    A/P: Diabetes T1DM currently uncontrolled. Patient experiencing hypoglycemia and hyper glycemia (post prandial). Patient is able to verbalize appropriate hypoglycemia management plan. Patient is adherent with medication. Control is suboptimal due to diet/meal  time insulin ratio  -Continue Tresiba 20 units nightly  -Adjusted Novolog sliding scale (1 unit per 5 grams of carb, was previously on 1 per 6 gm)  -Ordered libre 2 CGM via [employer] [facility] [employer] ([place] [facility])  -Extensively discussed pathophysiology of diabetes, recommended lifestyle interventions, dietary effects on blood sugar control  -Counseled on s/sx of and management of hypoglycemia  -Next A1C anticipated 11/2020.  Written patient instructions provided.  Total time in face to face counseling 25 minutes.    Regina Eck , PharmD, BCPS Clinical Pharmacist, [place]  II Phone [phone]

## 2020-09-27 ENCOUNTER — Emergency Department (HOSPITAL_COMMUNITY): Payer: Medicare Other

## 2020-09-27 ENCOUNTER — Inpatient Hospital Stay (HOSPITAL_COMMUNITY): Payer: Medicare Other

## 2020-09-27 ENCOUNTER — Encounter (HOSPITAL_COMMUNITY): Payer: Self-pay | Admitting: Pharmacy Technician

## 2020-09-27 ENCOUNTER — Inpatient Hospital Stay (HOSPITAL_COMMUNITY)
Admission: EM | Admit: 2020-09-27 | Discharge: 2020-10-01 | DRG: 064 | Disposition: A | Discharge status: Skilled Nursing Facility | Payer: Medicare Other | Attending: Internal Medicine | Admitting: Internal Medicine

## 2020-09-27 DIAGNOSIS — R29703 NIHSS score 3: Secondary | ICD-10-CM | POA: Diagnosis present

## 2020-09-27 DIAGNOSIS — I63431 Cerebral infarction due to embolism of right posterior cerebral artery: Principal | ICD-10-CM | POA: Diagnosis present

## 2020-09-27 DIAGNOSIS — R41841 Cognitive communication deficit: Secondary | ICD-10-CM | POA: Diagnosis not present

## 2020-09-27 DIAGNOSIS — M6281 Muscle weakness (generalized): Secondary | ICD-10-CM | POA: Diagnosis not present

## 2020-09-27 DIAGNOSIS — E1059 Type 1 diabetes mellitus with other circulatory complications: Secondary | ICD-10-CM

## 2020-09-27 DIAGNOSIS — R739 Hyperglycemia, unspecified: Secondary | ICD-10-CM

## 2020-09-27 DIAGNOSIS — H539 Unspecified visual disturbance: Secondary | ICD-10-CM | POA: Diagnosis not present

## 2020-09-27 DIAGNOSIS — E1069 Type 1 diabetes mellitus with other specified complication: Secondary | ICD-10-CM | POA: Diagnosis not present

## 2020-09-27 DIAGNOSIS — Z885 Allergy status to narcotic agent status: Secondary | ICD-10-CM

## 2020-09-27 DIAGNOSIS — I480 Paroxysmal atrial fibrillation: Secondary | ICD-10-CM | POA: Diagnosis present

## 2020-09-27 DIAGNOSIS — H5462 Unqualified visual loss, left eye, normal vision right eye: Secondary | ICD-10-CM | POA: Diagnosis not present

## 2020-09-27 DIAGNOSIS — E875 Hyperkalemia: Secondary | ICD-10-CM

## 2020-09-27 DIAGNOSIS — I700 Atherosclerosis of aorta: Secondary | ICD-10-CM | POA: Diagnosis present

## 2020-09-27 DIAGNOSIS — Z79899 Other long term (current) drug therapy: Secondary | ICD-10-CM

## 2020-09-27 DIAGNOSIS — Z85038 Personal history of other malignant neoplasm of large intestine: Secondary | ICD-10-CM | POA: Diagnosis not present

## 2020-09-27 DIAGNOSIS — Z87891 Personal history of nicotine dependence: Secondary | ICD-10-CM

## 2020-09-27 DIAGNOSIS — I639 Cerebral infarction, unspecified: Secondary | ICD-10-CM | POA: Diagnosis not present

## 2020-09-27 DIAGNOSIS — H409 Unspecified glaucoma: Secondary | ICD-10-CM | POA: Diagnosis not present

## 2020-09-27 DIAGNOSIS — Z8701 Personal history of pneumonia (recurrent): Secondary | ICD-10-CM

## 2020-09-27 DIAGNOSIS — E1159 Type 2 diabetes mellitus with other circulatory complications: Secondary | ICD-10-CM | POA: Diagnosis present

## 2020-09-27 DIAGNOSIS — Z20822 Contact with and (suspected) exposure to covid-19: Secondary | ICD-10-CM | POA: Diagnosis present

## 2020-09-27 DIAGNOSIS — I619 Nontraumatic intracerebral hemorrhage, unspecified: Secondary | ICD-10-CM | POA: Diagnosis present

## 2020-09-27 DIAGNOSIS — R0989 Other specified symptoms and signs involving the circulatory and respiratory systems: Secondary | ICD-10-CM | POA: Diagnosis present

## 2020-09-27 DIAGNOSIS — R7989 Other specified abnormal findings of blood chemistry: Secondary | ICD-10-CM

## 2020-09-27 DIAGNOSIS — G459 Transient cerebral ischemic attack, unspecified: Secondary | ICD-10-CM | POA: Diagnosis not present

## 2020-09-27 DIAGNOSIS — I69354 Hemiplegia and hemiparesis following cerebral infarction affecting left non-dominant side: Secondary | ICD-10-CM | POA: Diagnosis not present

## 2020-09-27 DIAGNOSIS — I5043 Acute on chronic combined systolic (congestive) and diastolic (congestive) heart failure: Secondary | ICD-10-CM | POA: Diagnosis not present

## 2020-09-27 DIAGNOSIS — E785 Hyperlipidemia, unspecified: Secondary | ICD-10-CM | POA: Diagnosis not present

## 2020-09-27 DIAGNOSIS — H53462 Homonymous bilateral field defects, left side: Secondary | ICD-10-CM | POA: Diagnosis present

## 2020-09-27 DIAGNOSIS — E1065 Type 1 diabetes mellitus with hyperglycemia: Secondary | ICD-10-CM | POA: Diagnosis not present

## 2020-09-27 DIAGNOSIS — R278 Other lack of coordination: Secondary | ICD-10-CM | POA: Diagnosis not present

## 2020-09-27 DIAGNOSIS — I5033 Acute on chronic diastolic (congestive) heart failure: Secondary | ICD-10-CM | POA: Diagnosis present

## 2020-09-27 DIAGNOSIS — J9811 Atelectasis: Secondary | ICD-10-CM | POA: Diagnosis not present

## 2020-09-27 DIAGNOSIS — Z794 Long term (current) use of insulin: Secondary | ICD-10-CM | POA: Diagnosis not present

## 2020-09-27 DIAGNOSIS — I152 Hypertension secondary to endocrine disorders: Secondary | ICD-10-CM | POA: Diagnosis not present

## 2020-09-27 DIAGNOSIS — R0600 Dyspnea, unspecified: Secondary | ICD-10-CM

## 2020-09-27 DIAGNOSIS — I509 Heart failure, unspecified: Secondary | ICD-10-CM | POA: Diagnosis present

## 2020-09-27 DIAGNOSIS — I4891 Unspecified atrial fibrillation: Secondary | ICD-10-CM | POA: Diagnosis present

## 2020-09-27 DIAGNOSIS — Z833 Family history of diabetes mellitus: Secondary | ICD-10-CM | POA: Diagnosis not present

## 2020-09-27 DIAGNOSIS — R059 Cough, unspecified: Secondary | ICD-10-CM | POA: Diagnosis not present

## 2020-09-27 DIAGNOSIS — I6389 Other cerebral infarction: Secondary | ICD-10-CM | POA: Diagnosis not present

## 2020-09-27 DIAGNOSIS — N179 Acute kidney failure, unspecified: Secondary | ICD-10-CM | POA: Diagnosis not present

## 2020-09-27 DIAGNOSIS — R000 Tachycardia, unspecified: Secondary | ICD-10-CM | POA: Diagnosis not present

## 2020-09-27 DIAGNOSIS — I10 Essential (primary) hypertension: Secondary | ICD-10-CM | POA: Diagnosis present

## 2020-09-27 DIAGNOSIS — J811 Chronic pulmonary edema: Secondary | ICD-10-CM | POA: Diagnosis not present

## 2020-09-27 DIAGNOSIS — R531 Weakness: Secondary | ICD-10-CM | POA: Diagnosis not present

## 2020-09-27 DIAGNOSIS — E1165 Type 2 diabetes mellitus with hyperglycemia: Secondary | ICD-10-CM | POA: Diagnosis not present

## 2020-09-27 DIAGNOSIS — J90 Pleural effusion, not elsewhere classified: Secondary | ICD-10-CM | POA: Diagnosis not present

## 2020-09-27 DIAGNOSIS — J069 Acute upper respiratory infection, unspecified: Secondary | ICD-10-CM | POA: Diagnosis not present

## 2020-09-27 DIAGNOSIS — I69398 Other sequelae of cerebral infarction: Secondary | ICD-10-CM

## 2020-09-27 DIAGNOSIS — I6503 Occlusion and stenosis of bilateral vertebral arteries: Secondary | ICD-10-CM | POA: Diagnosis not present

## 2020-09-27 DIAGNOSIS — I5021 Acute systolic (congestive) heart failure: Secondary | ICD-10-CM | POA: Diagnosis not present

## 2020-09-27 DIAGNOSIS — Z7401 Bed confinement status: Secondary | ICD-10-CM | POA: Diagnosis not present

## 2020-09-27 DIAGNOSIS — E109 Type 1 diabetes mellitus without complications: Secondary | ICD-10-CM | POA: Diagnosis present

## 2020-09-27 DIAGNOSIS — Z8673 Personal history of transient ischemic attack (TIA), and cerebral infarction without residual deficits: Secondary | ICD-10-CM | POA: Diagnosis present

## 2020-09-27 DIAGNOSIS — R519 Headache, unspecified: Secondary | ICD-10-CM | POA: Diagnosis not present

## 2020-09-27 DIAGNOSIS — R0602 Shortness of breath: Secondary | ICD-10-CM | POA: Diagnosis not present

## 2020-09-27 DIAGNOSIS — Z20818 Contact with and (suspected) exposure to other bacterial communicable diseases: Secondary | ICD-10-CM | POA: Diagnosis not present

## 2020-09-27 DIAGNOSIS — I6523 Occlusion and stenosis of bilateral carotid arteries: Secondary | ICD-10-CM | POA: Diagnosis not present

## 2020-09-27 DIAGNOSIS — J449 Chronic obstructive pulmonary disease, unspecified: Secondary | ICD-10-CM | POA: Diagnosis present

## 2020-09-27 DIAGNOSIS — R008 Other abnormalities of heart beat: Secondary | ICD-10-CM | POA: Diagnosis not present

## 2020-09-27 LAB — URINALYSIS, ROUTINE W REFLEX MICROSCOPIC
Bacteria, UA: NONE SEEN
Bilirubin Urine: NEGATIVE
Glucose, UA: >=500 mg/dL — AB
Hgb urine dipstick: NEGATIVE
Ketones, ur: 20 mg/dL — AB
Leukocytes,Ua: NEGATIVE
Nitrite: NEGATIVE
Protein, ur: NEGATIVE mg/dL
Specific Gravity, Urine: 1.031 — ABNORMAL HIGH (ref 1.005–1.030)
pH: 5 (ref 5.0–8.0)

## 2020-09-27 LAB — BASIC METABOLIC PANEL
Anion gap: 10 (ref 5–15)
BUN: 25 mg/dL — ABNORMAL HIGH (ref 8–23)
CO2: 19 mmol/L — ABNORMAL LOW (ref 22–32)
Calcium: 9.1 mg/dL (ref 8.9–10.3)
Chloride: 103 mmol/L (ref 98–111)
Creatinine, Ser: 1.63 mg/dL — ABNORMAL HIGH (ref 0.61–1.24)
GFR, Estimated: 45 mL/min — ABNORMAL LOW (ref >60)
Glucose, Bld: 435 mg/dL — ABNORMAL HIGH (ref 70–99)
Potassium: 5.2 mmol/L — ABNORMAL HIGH (ref 3.5–5.1)
Sodium: 132 mmol/L — ABNORMAL LOW (ref 135–145)

## 2020-09-27 LAB — I-STAT VENOUS BLOOD GAS, ED
Acid-base deficit: 8 mmol/L — ABNORMAL HIGH (ref 0.0–2.0)
Bicarbonate: 19.7 mmol/L — ABNORMAL LOW (ref 20.0–28.0)
Calcium, Ion: 1.15 mmol/L (ref 1.15–1.40)
HCT: 50 % (ref 39.0–52.0)
Hemoglobin: 17 g/dL (ref 13.0–17.0)
O2 Saturation: 40 %
Potassium: 3.9 mmol/L (ref 3.5–5.1)
Sodium: 134 mmol/L — ABNORMAL LOW (ref 135–145)
TCO2: 21 mmol/L — ABNORMAL LOW (ref 22–32)
pCO2, Ven: 45.7 mmHg (ref 44.0–60.0)
pH, Ven: 7.243 — ABNORMAL LOW (ref 7.250–7.430)
pO2, Ven: 27 mmHg — CL (ref 32.0–45.0)

## 2020-09-27 LAB — RESP PANEL BY RT-PCR (FLU A&B, COVID) ARPGX2
Influenza A by PCR: NEGATIVE
Influenza B by PCR: NEGATIVE
SARS Coronavirus 2 by RT PCR: NEGATIVE

## 2020-09-27 LAB — CBC
HCT: 43.5 % (ref 39.0–52.0)
Hemoglobin: 14.3 g/dL (ref 13.0–17.0)
MCH: 30.6 pg (ref 26.0–34.0)
MCHC: 32.9 g/dL (ref 30.0–36.0)
MCV: 92.9 fL (ref 80.0–100.0)
Platelets: 148 10*3/uL — ABNORMAL LOW (ref 150–400)
RBC: 4.68 MIL/uL (ref 4.22–5.81)
RDW: 12.6 % (ref 11.5–15.5)
WBC: 8.6 10*3/uL (ref 4.0–10.5)
nRBC: 0 % (ref 0.0–0.2)

## 2020-09-27 LAB — CBG MONITORING, ED
Glucose-Capillary: 429 mg/dL — ABNORMAL HIGH (ref 70–99)
Glucose-Capillary: 297 mg/dL — ABNORMAL HIGH (ref 70–99)
Glucose-Capillary: 245 mg/dL — ABNORMAL HIGH (ref 70–99)

## 2020-09-27 LAB — BRAIN NATRIURETIC PEPTIDE: B Natriuretic Peptide: 1566.5 pg/mL — ABNORMAL HIGH (ref 0.0–100.0)

## 2020-09-27 IMAGING — CT CT ANGIO NECK
2 of 7 series · 8 of 33 positions shown · IV contrast (APPLIED)
Comparison: None.

CLINICAL DATA: Change in vision

EXAM:
CT ANGIOGRAPHY HEAD AND NECK
TECHNIQUE: Multidetector CT imaging of the head and neck was performed using
the standard protocol during bolus administration of intravenous
contrast. Multiplanar CT image reconstructions and MIPs were
obtained to evaluate the vascular anatomy. Carotid stenosis
measurements (when applicable) are obtained utilizing NASCET
criteria, using the distal internal carotid diameter as the
denominator.
CONTRAST:  50mL OMNIPAQUE IOHEXOL 350 MG/ML SOLN

[Series 5: cta neck/head · axial · 0.53mm/px · z∈[-243,-121]mm · 2 of 184 slices shown]
[im 62/184  soft-tissue]
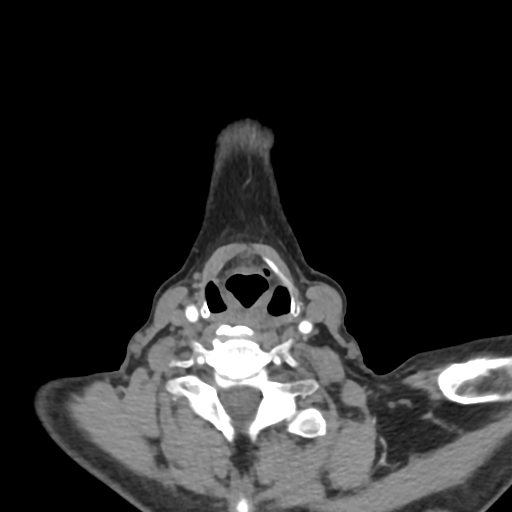
[im 123/184  soft-tissue]
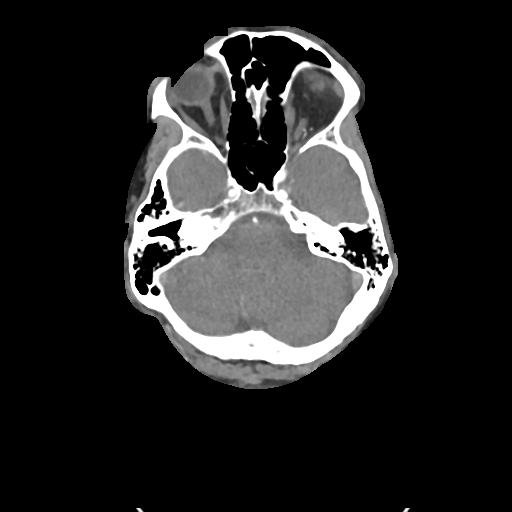

[Series 7: ax thins · axial · 0.50mm/px · z∈[-348,-104]mm · 6 of 369 slices shown]
[im 53/369  soft-tissue]
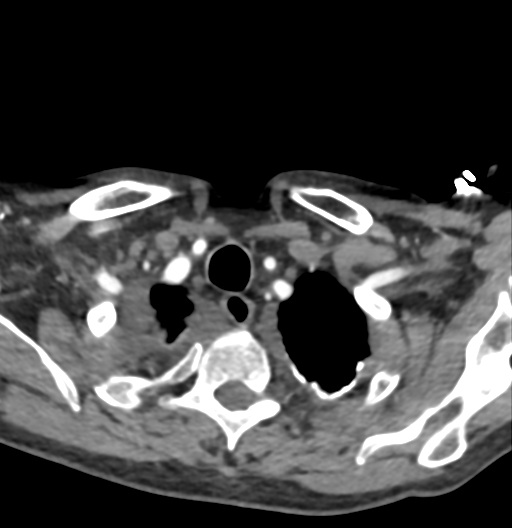
[im 106/369  bone]
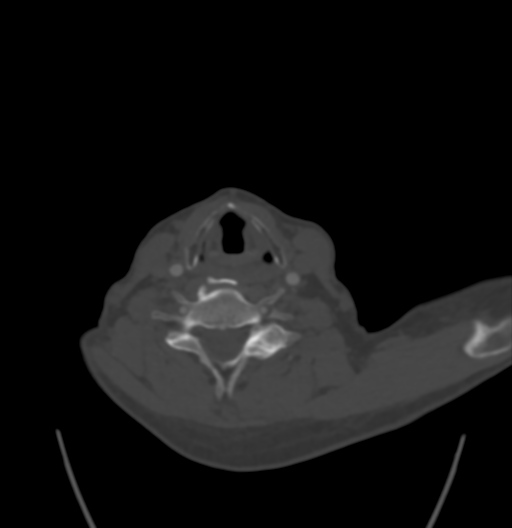
[im 158/369  soft-tissue]
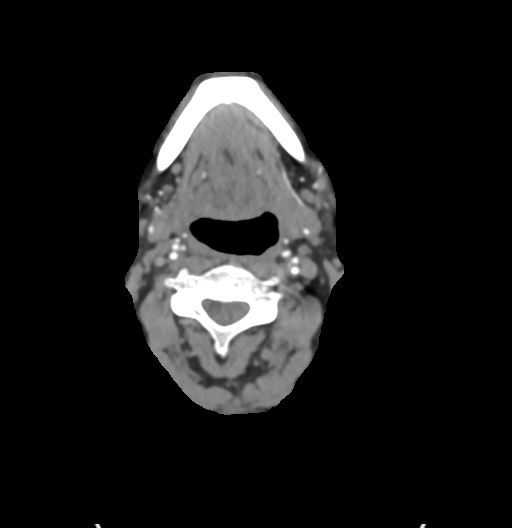
[im 211/369  bone]
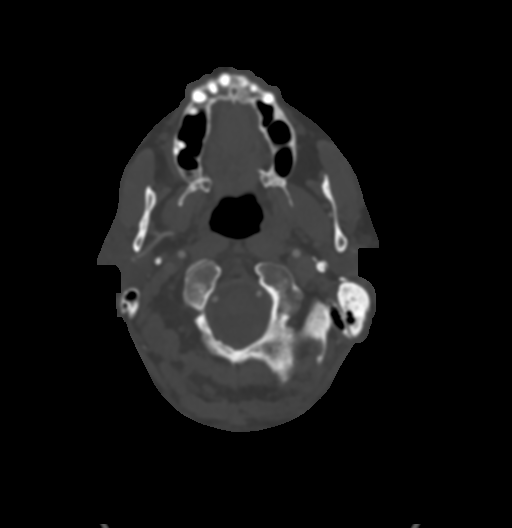
[im 263/369  soft-tissue]
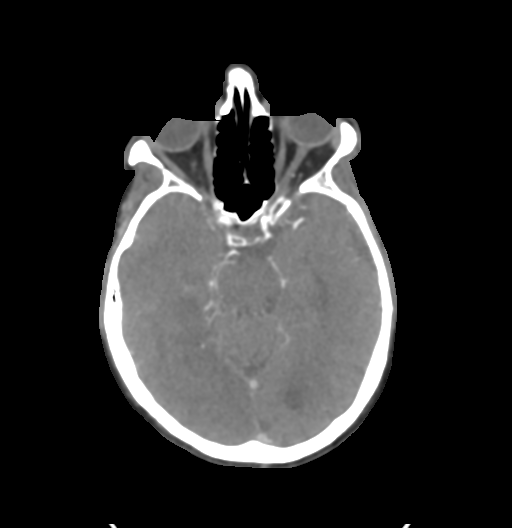
[im 316/369  bone]
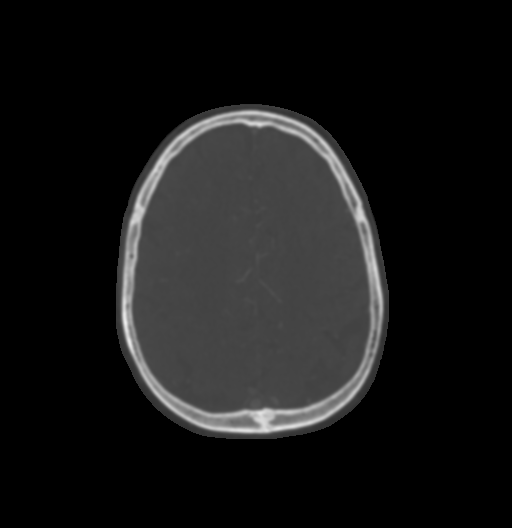

[8 of 33 positions shown; findings below may reference images not displayed]

FINDINGS: CTA NECK

Aortic arch: Great vessel origins are patent.

Right carotid system: Patent. Noncalcified plaque along the common
carotid with less than 50% stenosis. Calcified plaque along the
proximal internal carotid with less than 50% stenosis.

Left carotid system: Patent. Mixed plaque at the internal carotid
origin with less than 50% stenosis.

Vertebral arteries: Patent and codominant. Mild plaque at the left
vertebral origin.

Skeleton: No significant osseous abnormality.

Other neck: Unremarkable.

Upper chest: Bilateral pleural effusions.

Review of the MIP images confirms the above findings

CTA HEAD

Anterior circulation: Intracranial internal carotid arteries are
patent with calcified plaque causing mild stenosis. Anterior and
middle cerebral arteries are patent.

Posterior circulation: Intracranial vertebral arteries are patent.
Basilar artery is patent. Major cerebellar artery origins are
patent. Left posterior cerebral artery is patent. No proximal right
PCA occlusion. There is some right P3 and P4 irregularity with
possible occlusion of a proximal P4 branch noted.

Venous sinuses: Patent as allowed by contrast bolus timing.

Review of the MIP images confirms the above findings
IMPRESSION: There is no proximal intracranial vessel occlusion; specifically no
proximal right PCA occlusion. Irregularity more distally with
possible proximal P4 branch occlusion.

No large vessel occlusion in the neck. Mild plaque at the left
vertebral origin. Plaque at the ICA origins causes less than 50%
stenosis.

Bilateral pleural effusions.

## 2020-09-27 IMAGING — CR DG CHEST 2V
2 series · 2 of 2 positions shown · non-contrast
Comparison: Chest x-ray dated [DATE].

CLINICAL DATA: Cough and shortness of breath.

EXAM:
CHEST - 2 VIEW

[chest pa]
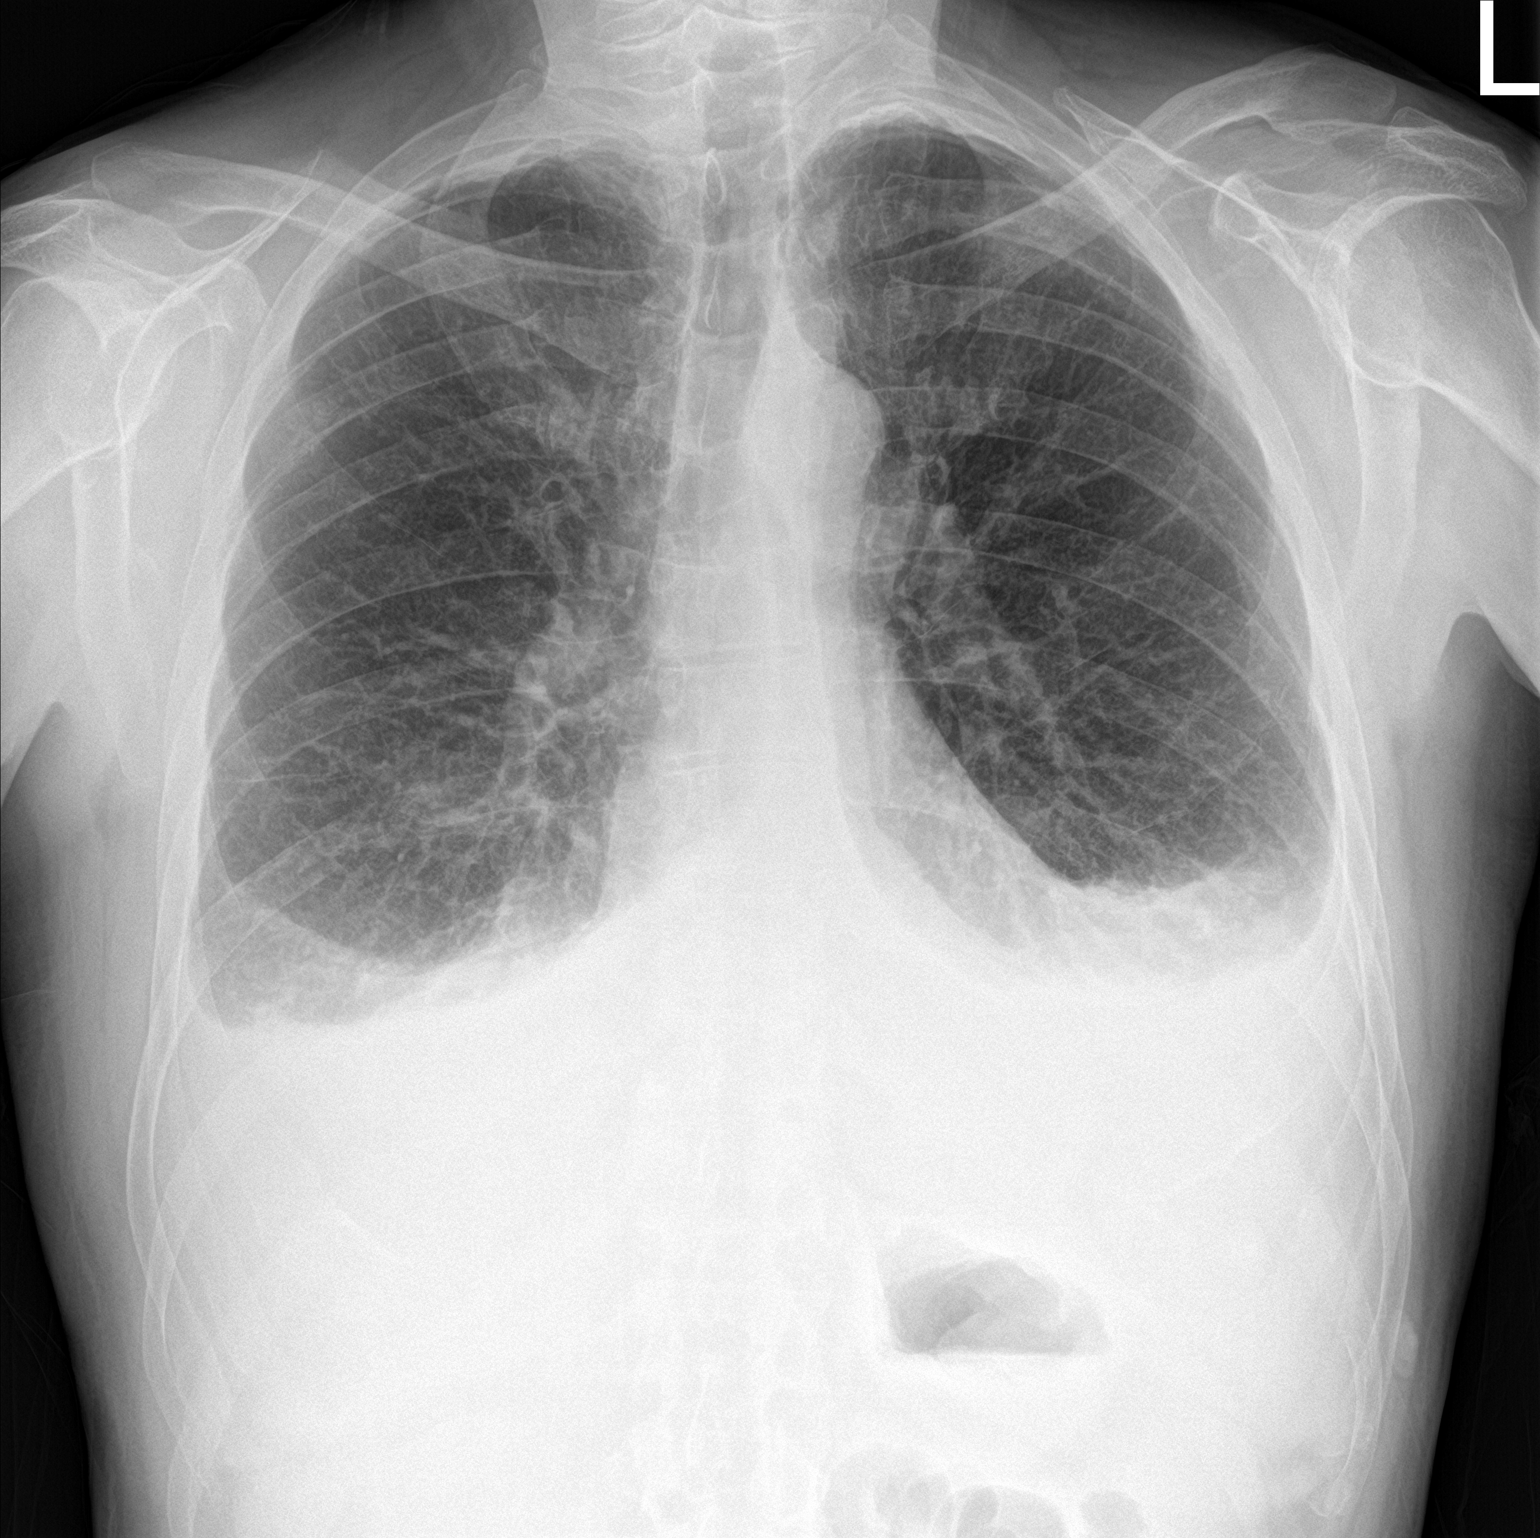

[chest lat]
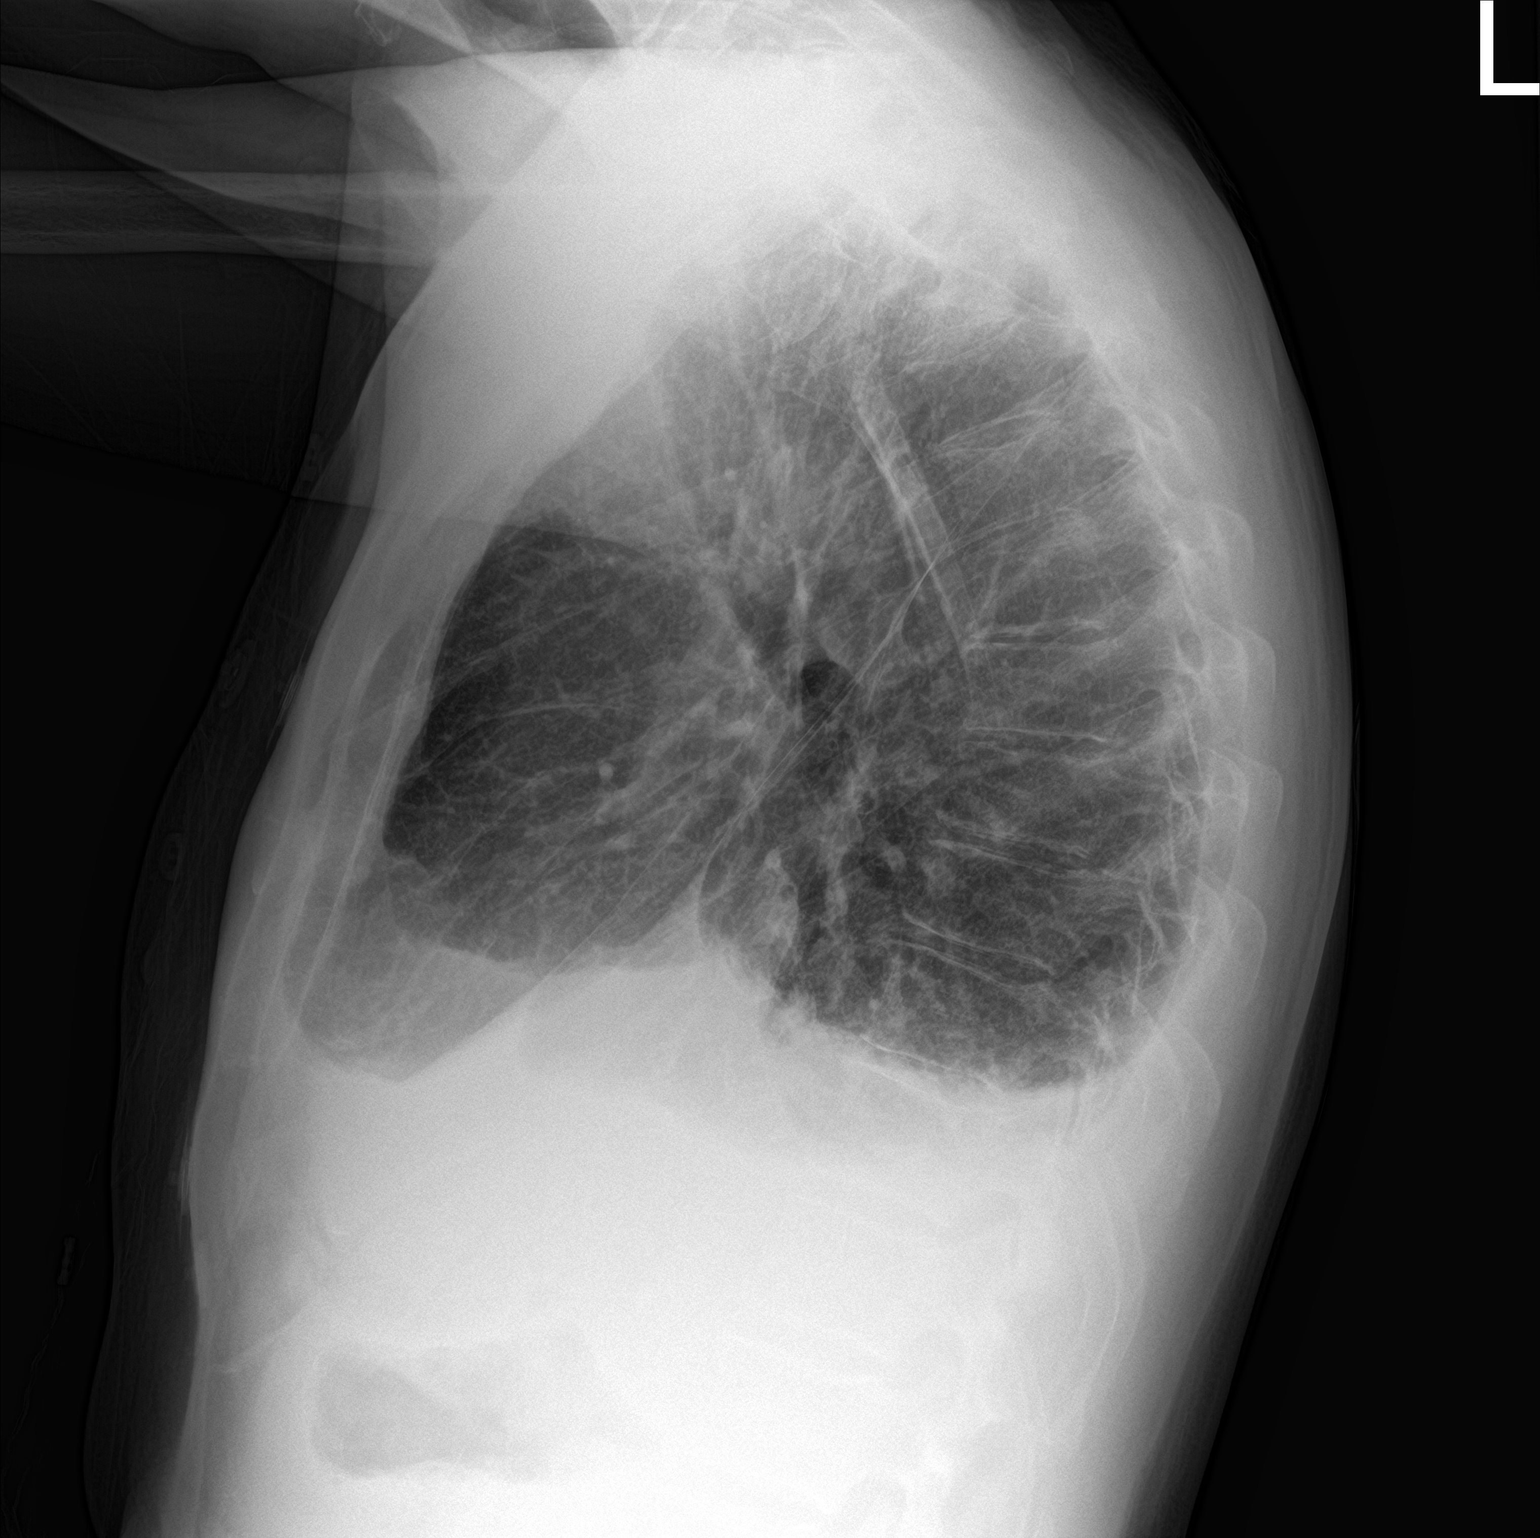

[2 of 2 positions shown; findings below may reference images not displayed]

FINDINGS: The heart size and mediastinal contours are within normal limits.
Pulmonary vascular congestion. Small bilateral pleural effusions and
bibasilar atelectasis. No pneumothorax. No acute osseous
abnormality.
IMPRESSION: 1. Pulmonary vascular congestion and small bilateral pleural
effusions.

## 2020-09-27 IMAGING — CT CT HEAD W/O CM
4 series · 16 of 47 positions shown, 18 images · non-contrast
Comparison: None.

CLINICAL DATA: Acute stroke suspected. Headache. Left vision
changes.

EXAM:
CT HEAD WITHOUT CONTRAST
TECHNIQUE: Contiguous axial images were obtained from the base of the skull
through the vertex without intravenous contrast.

[Series 3: head wo · axial · 0.42mm/px · z∈[-149,-29]mm · 7 of 34 slices shown, 9 images]
[im 5/34  brain]
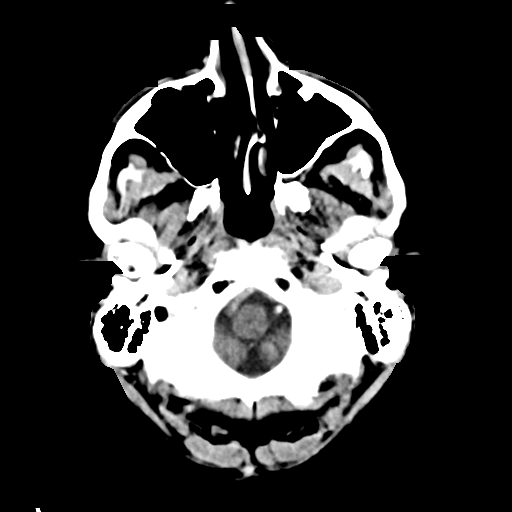
[im 5/34  bone]
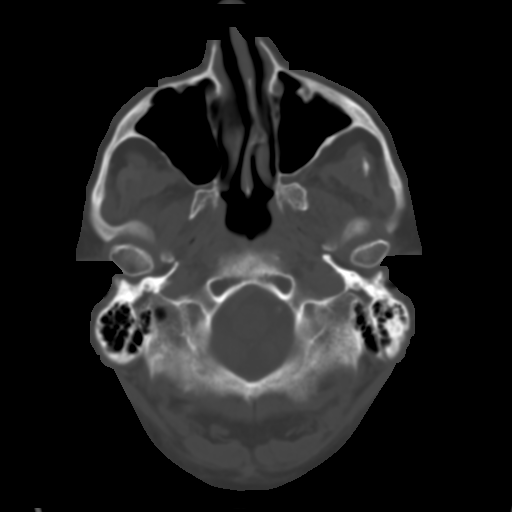
[im 9/34  brain]
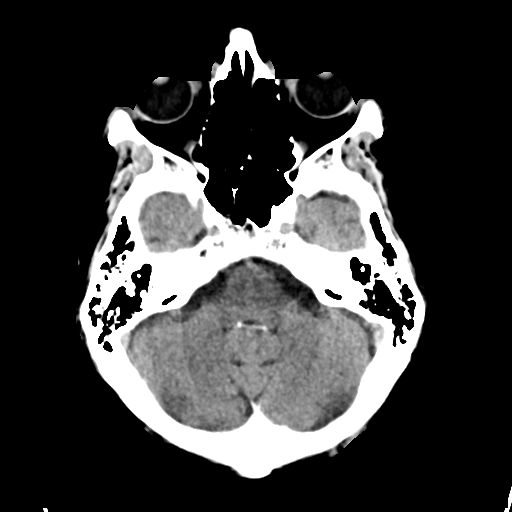
[im 13/34  brain]
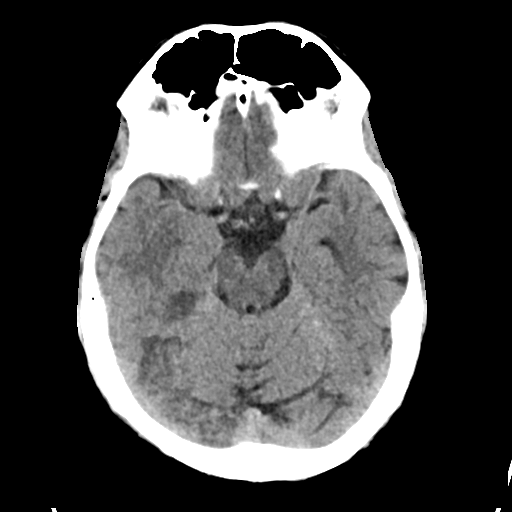
[im 17/34  brain]
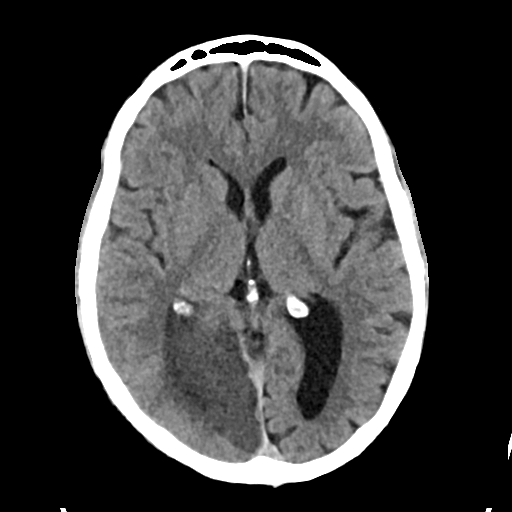
[im 21/34  brain]
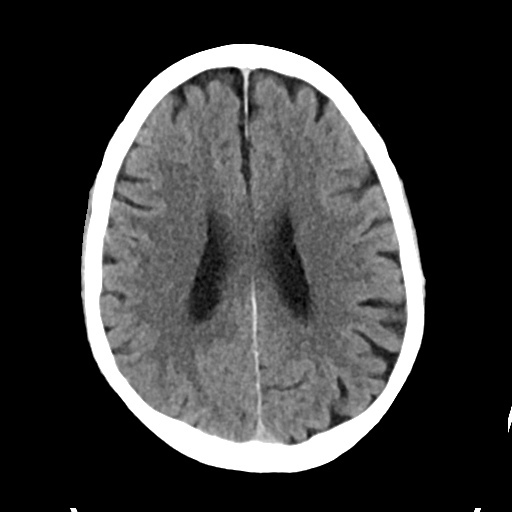
[im 21/34  bone]
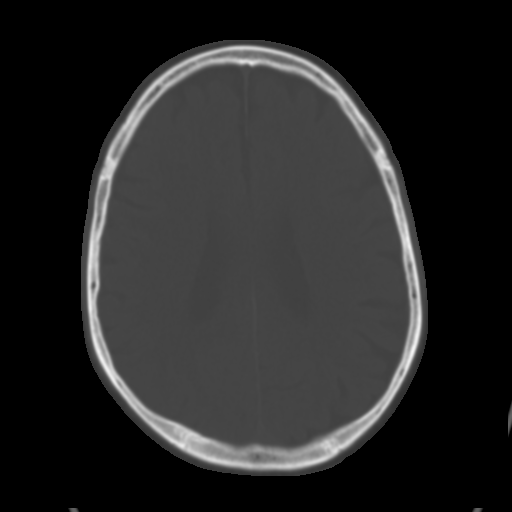
[im 25/34  brain]
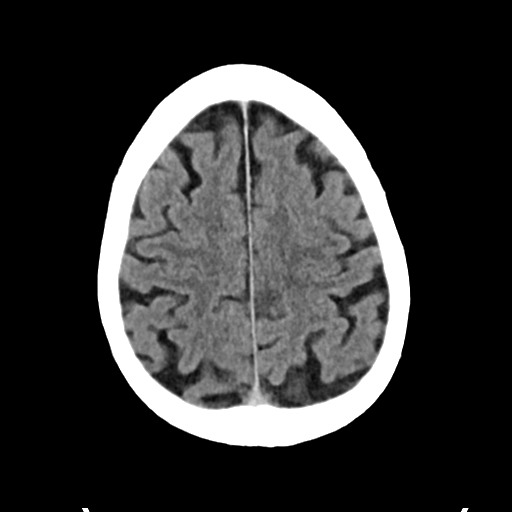
[im 29/34  brain]
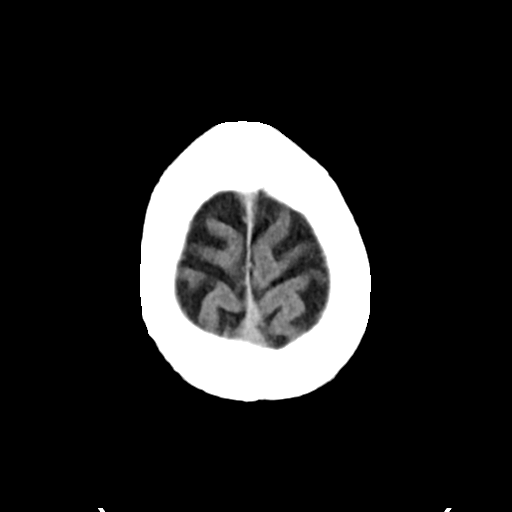

[Series 4: head bone · axial · 0.42mm/px · z∈[-153,-121]mm · 3 of 84 slices shown]
[im 9/84  bone]
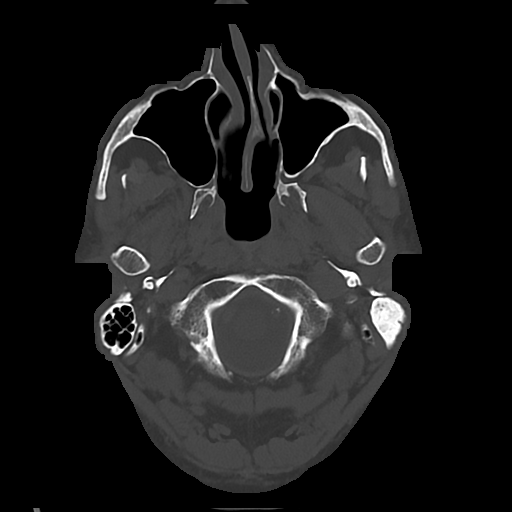
[im 17/84  bone]
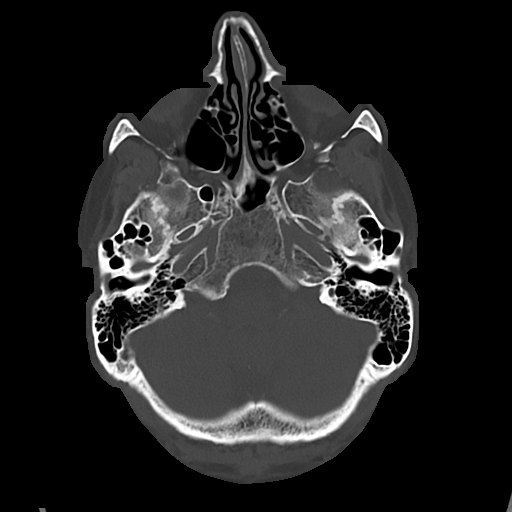
[im 25/84  bone]
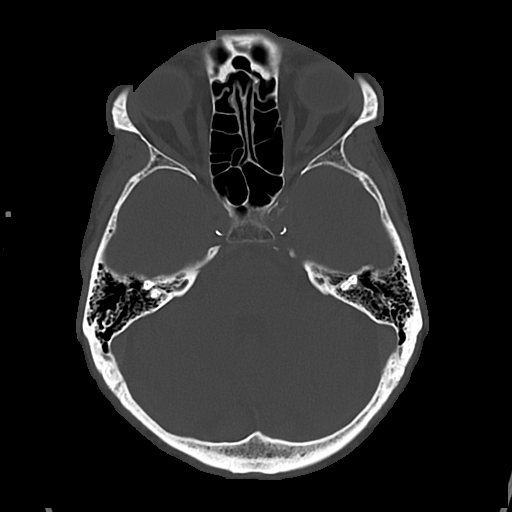

[Series 5: cor soft · coronal · 0.35mm/px · 3 of 74 slices shown]
[im 25/74  brain]
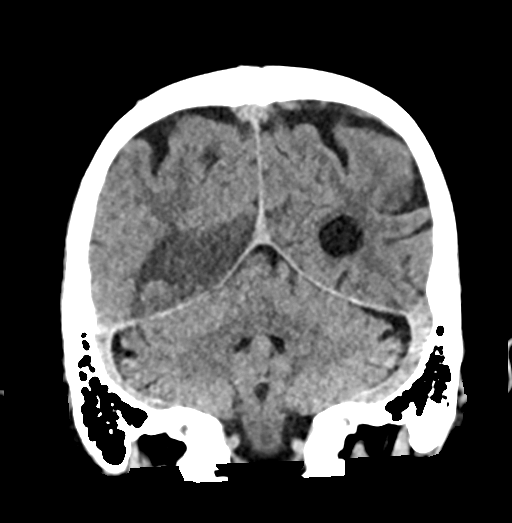
[im 33/74  brain]
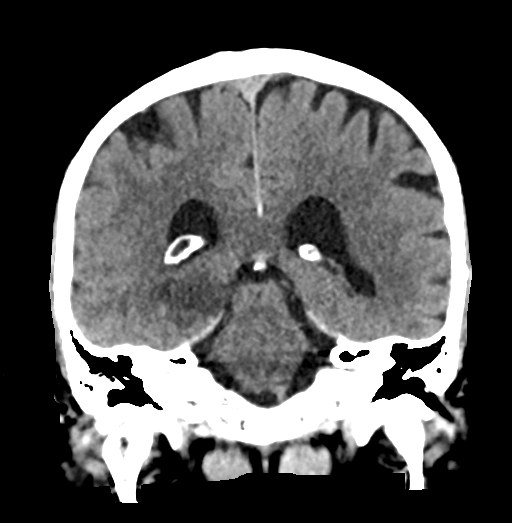
[im 41/74  brain]
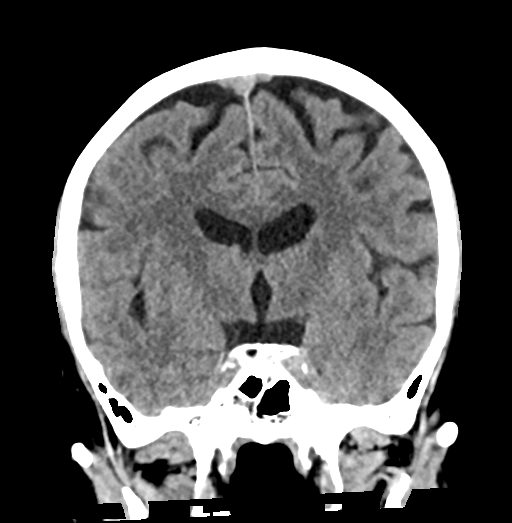

[Series 6: sag soft · sagittal · 0.31mm/px · 3 of 60 slices shown]
[im 20/60  brain]
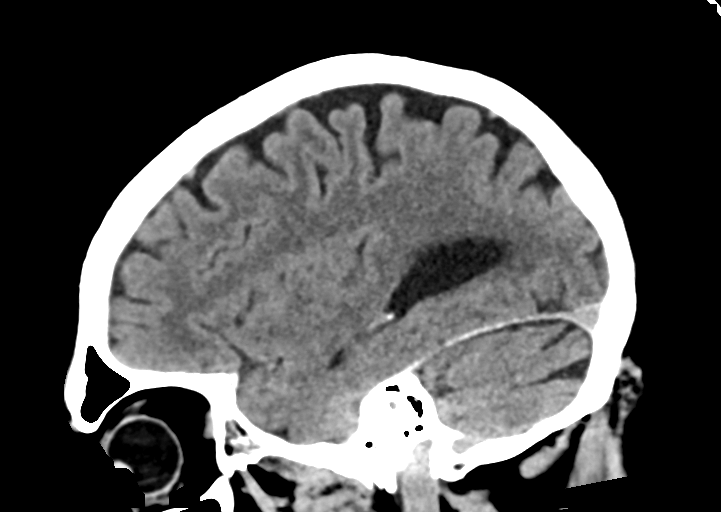
[im 30/60  brain]
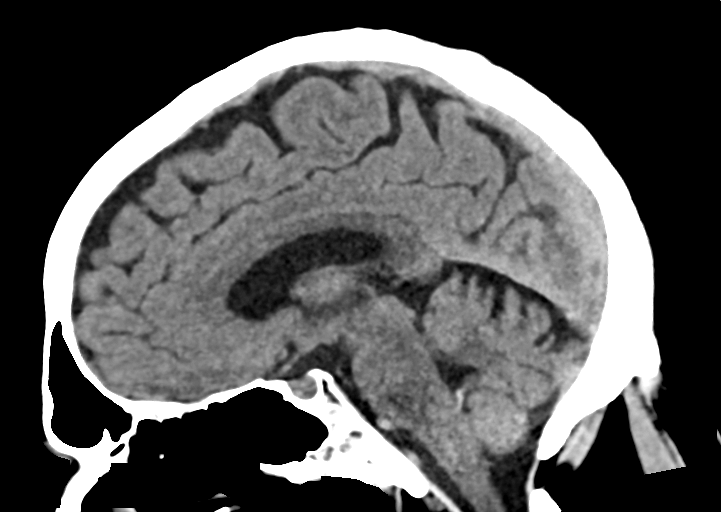
[im 40/60  brain]
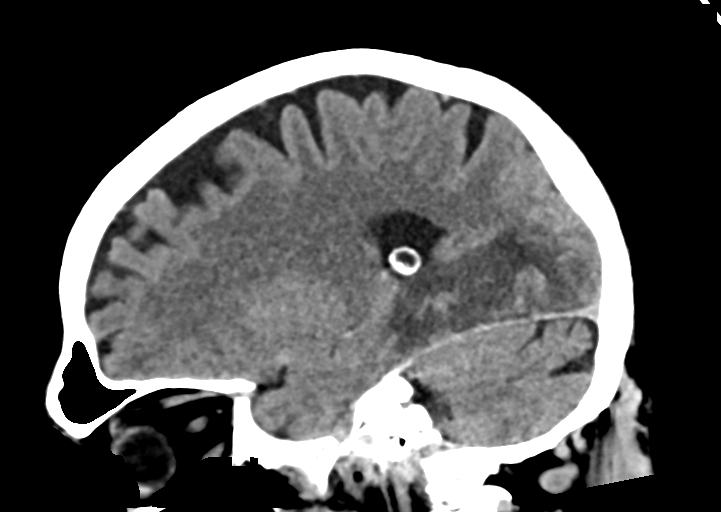

[16 of 47 positions shown; findings below may reference images not displayed]

FINDINGS: Brain: No subdural, epidural, or subarachnoid hemorrhage. There is
an infarct in the right occipital lobe. No other sites of cortical
ischemia or infarct identified. The occipital horn of the right
lateral ventricle is compressed due to mass effect from the infarct.
The ventricles are otherwise normal. No midline shift. Basal
cisterns are patent although there is mild narrowing of the
quadrigeminal plate cistern on the right due to the infarct. The
cerebellum and brainstem are normal.

Vascular: Calcified atherosclerosis is seen in the intracranial
carotids.

Skull: Normal. Negative for fracture or focal lesion.

Sinuses/Orbits: No acute finding.

Other: None.
IMPRESSION: 1. Right occipital lobe infarct consistent with the patient's
symptoms. The infarct exerts mild mass effect on the right side of
the quadrigeminal plate cistern and significant mass effect on the
occipital horn of the right lateral ventricle. There is no midline
shift however. There is likely punctate hemorrhage scattered
throughout the infarct. No large hematoma.
2. No other abnormalities.

## 2020-09-27 IMAGING — MR MR HEAD W/O CM
10 of 11 series · 43 of 48 positions shown · non-contrast
Comparison: Prior CTs from earlier the same day.

CLINICAL DATA: Initial evaluation for acute stroke, visual
disturbance.

EXAM:
MRI HEAD WITHOUT CONTRAST
TECHNIQUE: Multiplanar, multiecho pulse sequences of the brain and surrounding
structures were obtained without intravenous contrast.

[Series 5: DWI · axial · 3.0mm · 0.88mm/px · z∈[-12,+132]mm · 10 of 100 slices shown (1 of 4)]
[im 1/100]
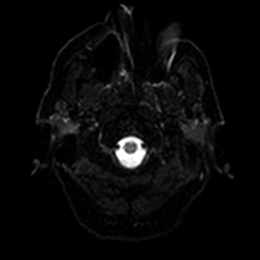
[im 12/100]
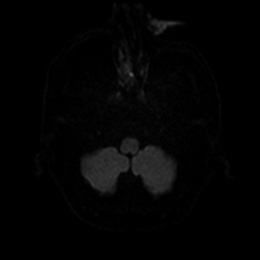
[im 23/100]
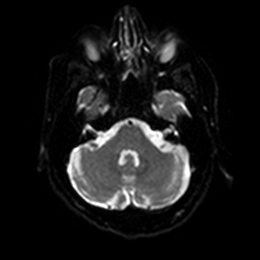
[im 34/100]
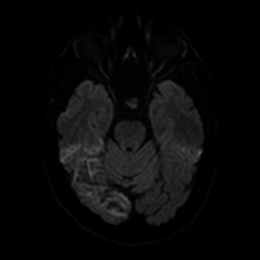
[im 45/100]
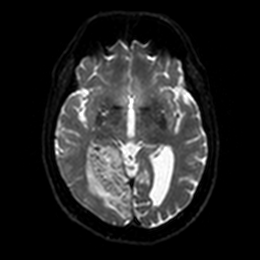
[im 56/100]
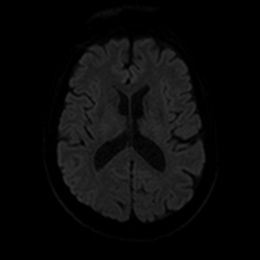
[im 67/100]
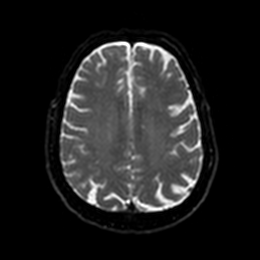
[im 78/100]
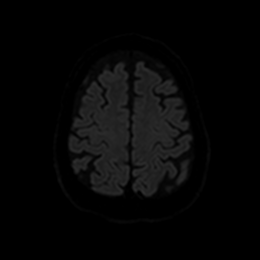
[im 89/100]
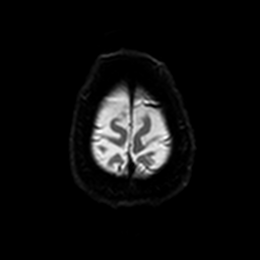
[im 100/100]
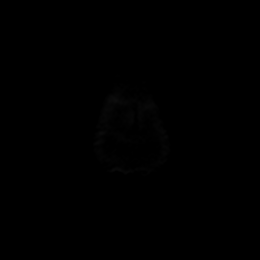

[Series 6: DWI · axial · 3.0mm · 0.88mm/px · z∈[-12,+132]mm · 5 of 50 slices shown (2 of 4)]
[im 1/50]
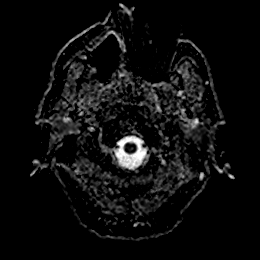
[im 13/50]
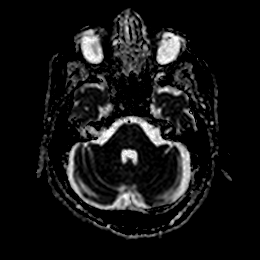
[im 25/50]
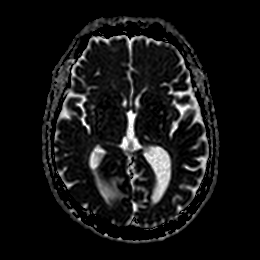
[im 37/50]
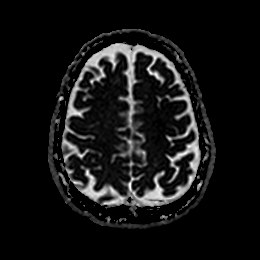
[im 50/50]
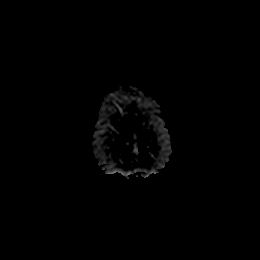

[Series 7: DWI · coronal · 4.0mm · 0.88mm/px · 6 of 64 slices shown (3 of 4)]
[im 1/64]
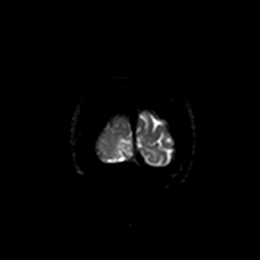
[im 13/64]
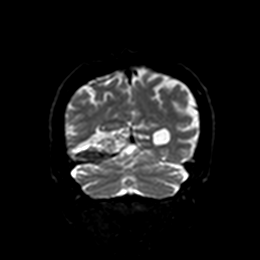
[im 26/64]
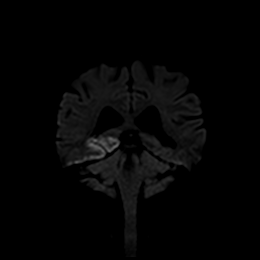
[im 38/64]
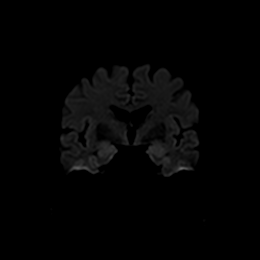
[im 51/64]
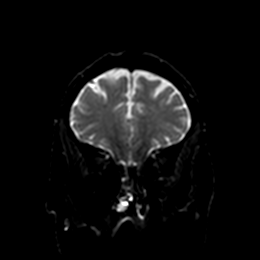
[im 64/64]
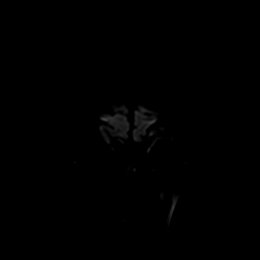

[Series 8: DWI · coronal · 4.0mm · 0.88mm/px · 3 of 32 slices shown (4 of 4)]
[im 1/32]
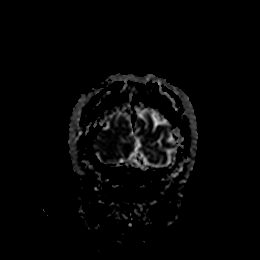
[im 16/32]
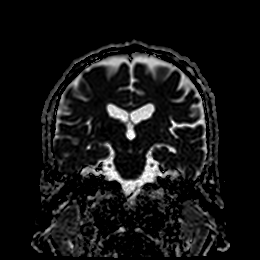
[im 32/32]
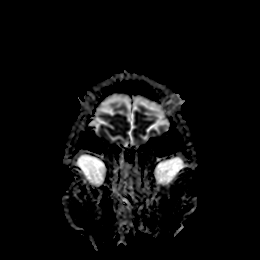

[Series 9: T1 · sagittal · 5.0mm · 0.75mm/px · 2 of 23 slices shown]
[im 1/23]
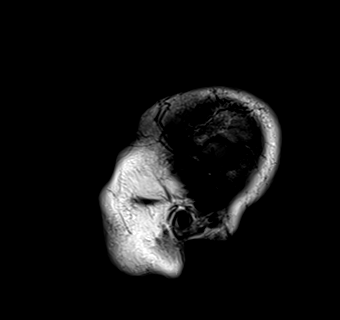
[im 23/23]
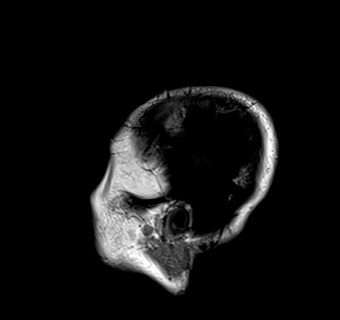

[Series 10: T2 · axial · 5.0mm · 0.72mm/px · z∈[-11,+130]mm · 2 of 25 slices shown (1 of 2)]
[im 1/25]
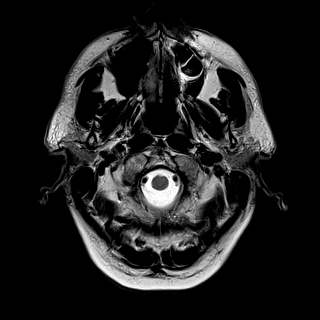
[im 25/25]
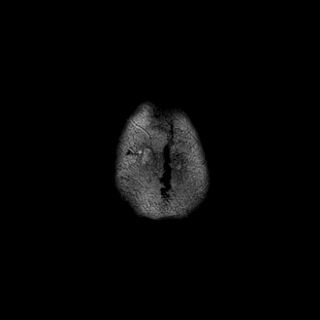

[Series 11: FLAIR · axial · 5.0mm · 0.45mm/px · z∈[-11,+130]mm · 2 of 25 slices shown]
[im 1/25]
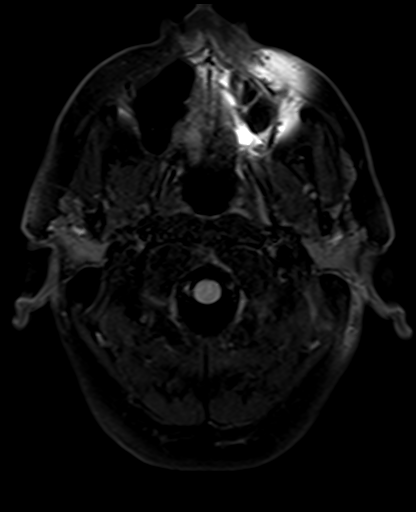
[im 25/25]
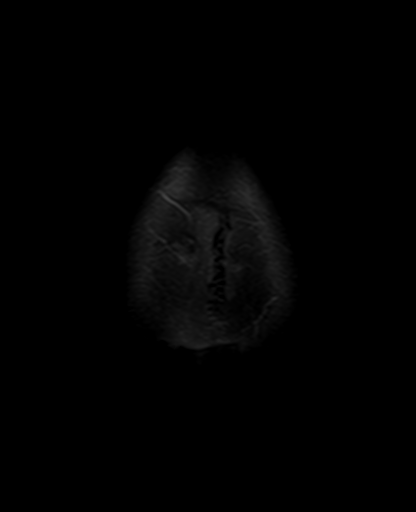

[Series 13: pha_images · axial · 3.0mm · 0.90mm/px · z∈[-28,+146]mm · 5 of 60 slices shown]
[im 1/60]
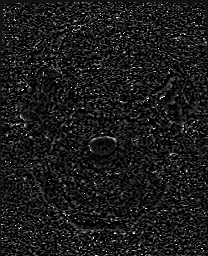
[im 15/60]
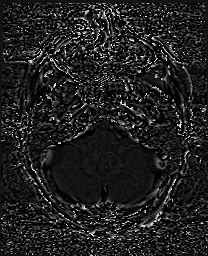
[im 30/60]
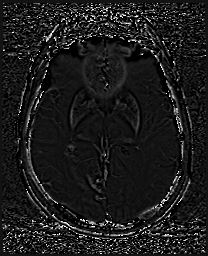
[im 45/60]
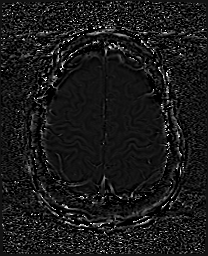
[im 60/60]
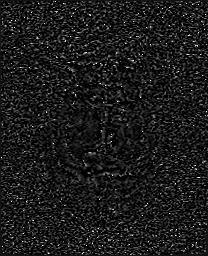

[Series 14: swi_images · axial · 3.0mm · 0.90mm/px · z∈[-28,+146]mm · 5 of 60 slices shown]
[im 1/60]
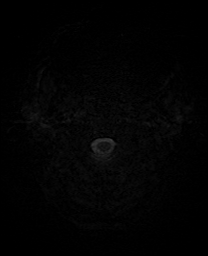
[im 15/60]
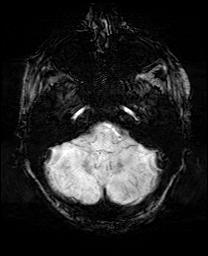
[im 30/60]
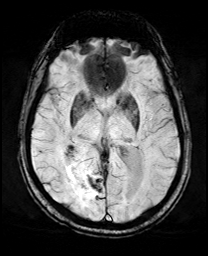
[im 45/60]
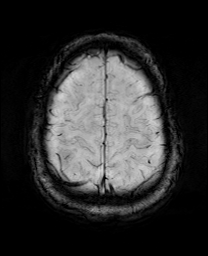
[im 60/60]
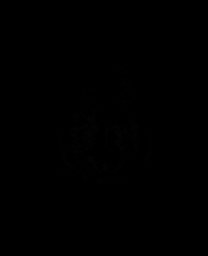

[Series 17: T2 · coronal · 5.0mm · 0.34mm/px · 3 of 29 slices shown (2 of 2)]
[im 1/29]
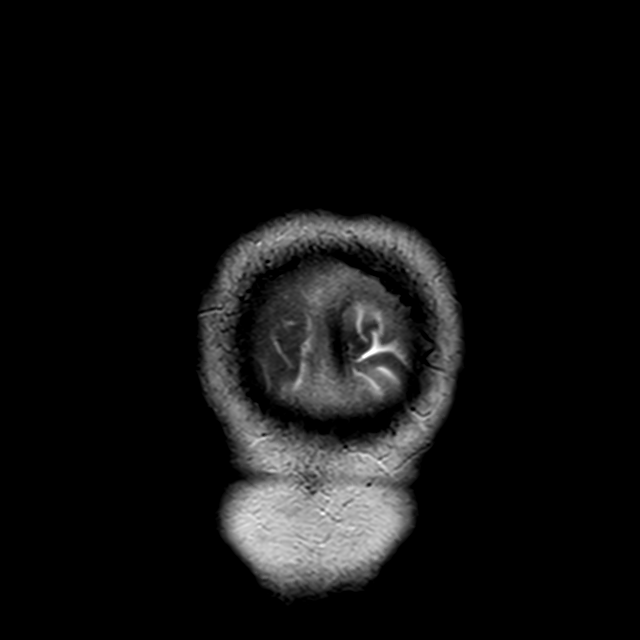
[im 15/29]
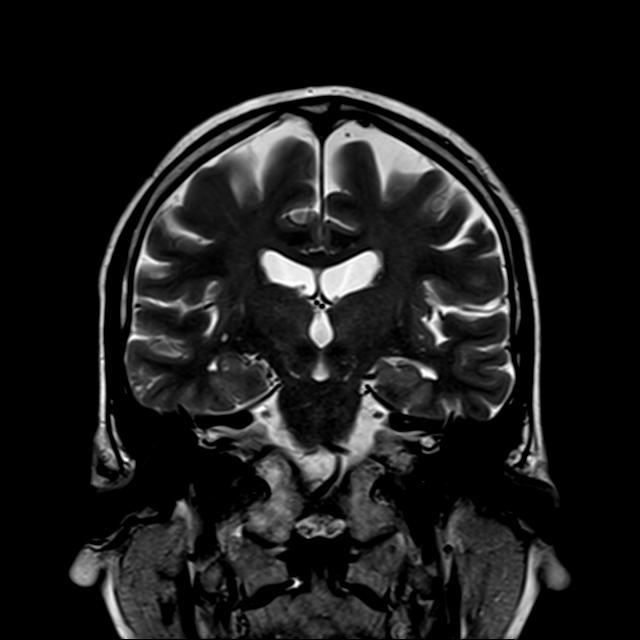
[im 29/29]
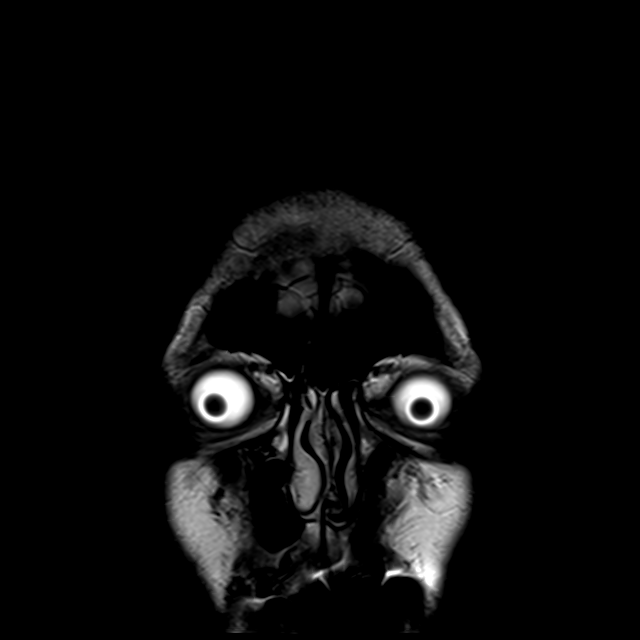

[43 of 48 positions shown; findings below may reference images not displayed]

FINDINGS: Brain: Cerebral volume within normal limits for age. Small remote
lacunar infarct at the left thalamus. No other significant small
vessel disease for age.

Moderately large evolving acute to early subacute right PCA
distribution infarct involving the right temporal occipital region
is seen, corresponding with abnormality on prior CT (series 5, image
70). Extensive susceptibility artifact seen throughout the area of
infarction, consistent with associated hemorrhage. No frank
intraparenchymal hematoma. Trace blood products seen layering within
the occipital horns of both lateral ventricles, likely related to
redistribution of small volume associated subarachnoid blood (series
14, image 28). No significant midline shift or regional mass effect.
No hydrocephalus or trapping.

No other evidence for acute or subacute ischemia. Gray-white matter
differentiation otherwise maintained. No other areas of remote
cortical infarction. No other evidence for acute or chronic
intracranial hemorrhage.

No mass lesion, midline shift or mass effect. No extra-axial fluid
collection. Pituitary gland suprasellar region normal. Midline
structures intact.

Vascular: Major intracranial vascular flow voids are maintained.

Skull and upper cervical spine: Craniocervical junction within
normal limits. Bone marrow signal intensity normal. No scalp soft
tissue abnormality.

Sinuses/Orbits: Globes and orbital soft tissues within normal
limits. Mild scattered mucosal thickening noted within the ethmoidal
air cells and maxillary sinuses. Paranasal sinuses are otherwise
clear. No significant mastoid effusion. Inner ear structures grossly
normal.

Other: None.
IMPRESSION: 1. Moderately large evolving acute to early subacute hemorrhagic
right PCA distribution infarct. No significant mass effect.
2. Trace blood products layering within the occipital horns of both
lateral ventricles, likely related to redistribution of small volume
associated subarachnoid blood. No hydrocephalus or trapping.
3. Small remote left thalamic lacunar infarct.

## 2020-09-27 MED ORDER — ACETAMINOPHEN 160 MG/5ML PO SOLN: 650.0000 mg | ORAL | Status: DC | PRN

## 2020-09-27 MED ORDER — IOHEXOL 350 MG/ML SOLN
50.0000 mL | Freq: Once | INTRAVENOUS | Status: AC | PRN
  Administered 2020-09-27: 50 mL via INTRAVENOUS

## 2020-09-27 MED ORDER — INSULIN ASPART 100 UNIT/ML IJ SOLN
4.0000 [IU] | Freq: Three times a day (TID) | INTRAMUSCULAR | Status: DC
  Administered 2020-09-28 – 2020-09-29 (×4): 4 [IU] via SUBCUTANEOUS

## 2020-09-27 MED ORDER — INSULIN DEGLUDEC 100 UNIT/ML ~~LOC~~ SOPN: 14.0000 [IU] | PEN_INJECTOR | Freq: Every day | SUBCUTANEOUS | Status: DC

## 2020-09-27 MED ORDER — ADULT MULTIVITAMIN W/MINERALS CH
1.0000 | ORAL_TABLET | ORAL | Status: DC
  Administered 2020-09-28 – 2020-10-01 (×4): 1 via ORAL
  Filled 2020-09-27 (×4): qty 1

## 2020-09-27 MED ORDER — FUROSEMIDE 10 MG/ML IJ SOLN
20.0000 mg | Freq: Once | INTRAMUSCULAR | Status: AC
  Administered 2020-09-27: 20 mg via INTRAVENOUS
  Filled 2020-09-27: qty 2

## 2020-09-27 MED ORDER — INSULIN ASPART 100 UNIT/ML IJ SOLN
8.0000 [IU] | Freq: Once | INTRAMUSCULAR | Status: AC
  Administered 2020-09-27: 8 [IU] via SUBCUTANEOUS

## 2020-09-27 MED ORDER — INSULIN DEGLUDEC 100 UNIT/ML ~~LOC~~ SOPN: 20.0000 [IU] | PEN_INJECTOR | Freq: Every day | SUBCUTANEOUS | Status: DC

## 2020-09-27 MED ORDER — ASPIRIN EC 81 MG PO TBEC
81.0000 mg | DELAYED_RELEASE_TABLET | Freq: Every day | ORAL | Status: DC
  Administered 2020-09-27 – 2020-10-01 (×5): 81 mg via ORAL
  Filled 2020-09-27 (×5): qty 1

## 2020-09-27 MED ORDER — SODIUM CHLORIDE 0.9 % IV BOLUS
500.0000 mL | Freq: Once | INTRAVENOUS | Status: AC
  Administered 2020-09-27: 500 mL via INTRAVENOUS

## 2020-09-27 MED ORDER — ACETAMINOPHEN 325 MG PO TABS
650.0000 mg | ORAL_TABLET | ORAL | Status: DC | PRN
  Administered 2020-09-27 – 2020-10-01 (×8): 650 mg via ORAL
  Filled 2020-09-27 (×8): qty 2

## 2020-09-27 MED ORDER — SIMVASTATIN 20 MG PO TABS
40.0000 mg | ORAL_TABLET | Freq: Every day | ORAL | Status: DC
  Administered 2020-09-27: 40 mg via ORAL
  Filled 2020-09-27: qty 2

## 2020-09-27 MED ORDER — STROKE: EARLY STAGES OF RECOVERY BOOK
Freq: Once | Status: DC
  Filled 2020-09-27 (×2): qty 1

## 2020-09-27 MED ORDER — TRIAMCINOLONE ACETONIDE 55 MCG/ACT NA AERO
2.0000 | INHALATION_SPRAY | Freq: Every day | NASAL | Status: DC
  Administered 2020-09-29 – 2020-10-01 (×3): 2 via NASAL
  Filled 2020-09-27 (×2): qty 21.6

## 2020-09-27 MED ORDER — INSULIN ASPART 100 UNIT/ML IJ SOLN
0.0000 [IU] | Freq: Three times a day (TID) | INTRAMUSCULAR | Status: DC
  Administered 2020-09-28: 11 [IU] via SUBCUTANEOUS
  Administered 2020-09-28: 3 [IU] via SUBCUTANEOUS
  Administered 2020-09-29: 8 [IU] via SUBCUTANEOUS

## 2020-09-27 MED ORDER — LORATADINE 10 MG PO TABS
10.0000 mg | ORAL_TABLET | Freq: Every evening | ORAL | Status: DC
  Administered 2020-09-28 – 2020-09-30 (×3): 10 mg via ORAL
  Filled 2020-09-27 (×3): qty 1

## 2020-09-27 MED ORDER — ONDANSETRON HCL 4 MG/2ML IJ SOLN
4.0000 mg | Freq: Once | INTRAMUSCULAR | Status: AC
  Administered 2020-09-27: 4 mg via INTRAVENOUS
  Filled 2020-09-27: qty 2

## 2020-09-27 MED ORDER — ACETAMINOPHEN 650 MG RE SUPP: 650.0000 mg | RECTAL | Status: DC | PRN

## 2020-09-27 NOTE — ED Provider Notes (Signed)
[facility] EMERGENCY DEPARTMENT Provider Note   CSN: [id number] Arrival date & time: 09/27/20  1511    History Vision changes, SOB   Greg Robertson is a 70 y.o. 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  There is associated headache at that time.  Describes his vision as "blurry" however describes it as worse to his lateral vision left eye.  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  He is unsure if his right eye is at baseline.  He denies any paresthesias or weakness.  No difficulty with word finding.  He denies prior history of CVA.  Patient also with cough and shortness of breath.  Has had cough over the last month or so.  Productive of clear sputum.  He thought this was related to allergies and has been doing OTC medication.  He states he sleeps with 1-2 pillows at night however occasionally feels increased shortness of breath when he lays flat.  He denies any wheezing.  He denies any prior history of heart failure.  No chest pain.  Does occasionally have lower extremity swelling.  Denies any prior history of PE or DVT.    Patient does state his blood sugars at home have been elevated in the 300s.  He does have history of recurrent hypoglycemia.  He just received a continuous glucose monitor however has not had this placed.  He denies any recent sick contacts.  Does have some occasional polyuria.  No fever, chest pain, abdominal pain, diarrhea, dysuria, unilateral leg swelling.   Denies additional aggravating or alleviating factors.  Per last Cards note 02/2018, Afib thought to be related to medication rxn for shoulder injury. Stopped Eliquis at that time.   Hx of right eye arterial occlusion>>Followed by Dr. Katy Fitch with opthalmology   PCP- Dettinger> Josie Saunders    HPI     Past Medical History:   Diagnosis Date   Allergic rhinitis    Anemia    Atrial fibrillation ([place])    Persistent   Cancer of ascending colon, 7cm 05/25/2011   Diabetes mellitus type I ([place])    Glaucoma    Hypertension    Pneumonia 1979 or 1980   Prostate nodule    Substance abuse [facility])     Patient Active Problem List   Diagnosis Date Noted   Acute ischemic stroke ([place]) 09/27/2020   COPD (chronic obstructive pulmonary disease) ([place]) 09/05/2017   Aortic atherosclerosis ([place]) 09/04/2017   Benign prostatic hyperplasia 09/04/2017   Hyperlipidemia due to type 1 diabetes mellitus ([place]) 12/12/2013   Hypertension associated with diabetes ([place]) 03/14/2012   Acute on chronic diastolic CHF (congestive heart failure) ([place]) 03/12/2012   Lynch syndrome 07/28/2011   History of colon cancer 05/25/2011   DM type 1 (diabetes mellitus, type 1) ([place]) 05/10/2011   Glaucoma 05/10/2011    Past Surgical History:  Procedure Laterality Date   broken left shoulder blade and collar bone     FINGER SURGERY  2009   right middle   LAPAROSCOPIC ASSISTED 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     NASAL FRACTURE SURGERY  1968   PORTACATH PLACEMENT  07/01/2011   Procedure: INSERTION PORT-A-CATH;  Surgeon: Adin Hector, MD;  Location: [facility];  Service: General;  Laterality: Left;  Insertion of Port-A-Catheter Left Internal Jugular   TONSILLECTOMY  1957 -  approximate       Family History  Problem Relation Age of Onset   Colon polyps Father    Lung cancer Father    Diabetes Father    Prostate cancer Father    Breast cancer Mother    Pancreatitis Mother        intestinal adhesions   Insomnia Mother    Colon cancer Neg Hx    Rectal cancer Neg Hx     Social History   Tobacco Use   Smoking status: Former    Packs/day: 1.00    Years: 15.00    Pack years: 15.00    Types: Cigarettes    Quit date: 04/18/1993    Years since quitting: 27.4   Smokeless tobacco: Never   Tobacco comments:    marijuana every night    Vaping Use   Vaping Use: Never used  Substance Use Topics   Alcohol use: Yes    Alcohol/week: 5.0 standard drinks    Types: 3 Cans of beer, 2 Shots of liquor per week    Comment: 1 drink every 2 days   Drug use: Yes    Types: Marijuana    Comment: once a night marijuana    Home Medications Prior to Admission medications   Medication Sig Start Date End Date Taking? Authorizing Provider  acetaminophen (TYLENOL) 500 MG tablet Take 1,000 mg by mouth every 6 (six) hours as needed. Pain     [provider]  Ascorbic Acid (VITAMIN C PO) Take 1 tablet by mouth every morning.     [provider]  Continuous Blood Gluc Receiver (FREESTYLE LIBRE 2 READER) DEVI USE IN THE MORNING, AT NOON, IN THE EVENING, AND AT BEDTIME AS DIRECTED Dx E10.9 08/25/20   Dettinger, Fransisca Kaufmann, MD  Continuous Blood Gluc Sensor (FREESTYLE LIBRE 2 SENSOR) MISC 1 each by Does not apply route every 14 (fourteen) days 08/25/20   Dettinger, Fransisca Kaufmann, MD  glucose blood test strip Use as instructed 04/10/13   Barton Dubois, MD  insulin aspart (NOVOLOG) 100 UNIT/ML injection INJECT 8-15 UNITS THREE TIMES DAILY BEFORE MEALS 04/02/20   Dettinger, Fransisca Kaufmann, MD  insulin degludec (TRESIBA FLEXTOUCH) 100 UNIT/ML FlexTouch Pen Inject 20 Units into the skin every morning. 04/02/20   Dettinger, Fransisca Kaufmann, MD  levocetirizine (XYZAL) 5 MG tablet Take 1 tablet (5 mg total) by mouth daily. 04/02/20   Dettinger, Fransisca Kaufmann, MD  lisinopril (ZESTRIL) 10 MG tablet Take 1 tablet (10 mg total) by mouth daily. 04/02/20   Dettinger, Fransisca Kaufmann, MD  Multiple Vitamin (MULTIVITAMIN) tablet Take 1 tablet by mouth every morning.     [provider]  simvastatin (ZOCOR) 40 MG tablet TAKE 1 TABLET BY MOUTH EVERYDAY AT BEDTIME 04/02/20   Dettinger, Fransisca Kaufmann, MD  Tafluprost, PF, (ZIOPTAN) 0.0015 % SOLN Place 1 drop into both eyes at bedtime.  Patient not taking: Reported on 08/24/2020    [provider]  triamcinolone (NASACORT)  55 MCG/ACT AERO nasal inhaler INSTILL 1 SPRAY IN EACH NOSTRIL ONCE OR TWICE DAILY AS NEEDED. 09/04/17   Chipper Herb, MD    Allergies    Oxycodone  Review of Systems   Review of Systems  Constitutional:  Positive for fatigue. Negative for activity change, appetite change, chills, diaphoresis, fever and unexpected weight change.  HENT: Negative.    Eyes:  Positive for visual disturbance. Negative for pain, discharge and itching.  Respiratory:  Positive for cough and shortness of breath. Negative for apnea,  choking, chest tightness, wheezing and stridor.   Cardiovascular: Negative.   Gastrointestinal: Negative.   Genitourinary:  Positive for frequency.  Musculoskeletal: Negative.   Skin: Negative.   Neurological:  Positive for headaches. Negative for dizziness, tremors, seizures, syncope, facial asymmetry, weakness, light-headedness and numbness.  All other systems reviewed and are negative.  Physical Exam Updated Vital Signs BP (!) 174/98   Pulse (!) 102   Temp 98.9 F (37.2 C) (Oral)   Resp (!) 25   SpO2 99%   Physical Exam Vitals and nursing note reviewed.  Constitutional:      General: He is not in acute distress.    Appearance: He is well-developed. He is not ill-appearing, toxic-appearing or diaphoretic.  HENT:     Head: Normocephalic and atraumatic.     Nose: Nose normal.     Comments: Clear rhinorrhea bilateral nares    Mouth/Throat:     Mouth: Mucous membranes are moist.  Eyes:     General: Visual field deficit present.     Extraocular Movements: Extraocular movements intact.     Conjunctiva/sclera: Conjunctivae normal.     Pupils: Pupils are equal, round, and reactive to light.     Comments: Central vision loss right eye, Decreased vision to left eye generally however seem to have increased vision in lateral left eye   Neck:     Comments: Full range of motion without difficulty Cardiovascular:     Rate and Rhythm: Regular rhythm. Tachycardia present.      Pulses: Normal pulses.     Heart sounds: Normal heart sounds.  Pulmonary:     Effort: Pulmonary effort is normal. No respiratory distress.     Breath sounds: Normal breath sounds.     Comments: Minimal crackles at bases.  Speaks in full sentences without difficulty.  No acute respiratory distress. Abdominal:     General: There is no distension.     Palpations: Abdomen is soft.     Comments: Soft, nontender without rebound or guarding  Musculoskeletal:        General: No swelling, tenderness, deformity or signs of injury. Normal range of motion.     Cervical back: Normal range of motion and neck supple.     Right lower leg: Edema present.     Left lower leg: Edema present.     Comments: Moves all 4 extremities at difficulty.  Trace pitting edema to bilateral shins.  Skin:    General: Skin is warm and dry.     Comments: No rash or lesions  Neurological:     General: No focal deficit present.     Mental Status: He is alert and oriented to person, place, and time.     Cranial Nerves: No cranial nerve deficit, dysarthria or facial asymmetry.     Sensory: Sensation is intact.     Motor: Motor function is intact.     Coordination: Coordination is intact.     Gait: Gait is intact.     Comments: Equal strength bilaterally Intact sensation bilaterally     ED Results / Procedures / Treatments   Labs (all labs ordered are listed, but only abnormal results are displayed) Labs Reviewed  BASIC METABOLIC PANEL - Abnormal; Notable for the following components:      Result Value   Sodium 132 (*)    Potassium 5.2 (*)    CO2 19 (*)    Glucose, Bld 435 (*)    BUN 25 (*)    Creatinine, Ser 1.63 (*)  GFR, Estimated 45 (*)    All other components within normal limits  CBC - Abnormal; Notable for the following components:   Platelets 148 (*)    All other components within normal limits  BRAIN NATRIURETIC PEPTIDE - Abnormal; Notable for the following components:   B Natriuretic Peptide  1,566.5 (*)    All other components within normal limits  CBG MONITORING, ED - Abnormal; Notable for the following components:   Glucose-Capillary 429 (*)    All other components within normal limits  I-STAT VENOUS BLOOD GAS, ED - Abnormal; Notable for the following components:   pH, Ven 7.243 (*)    pO2, Ven 27.0 (*)    Bicarbonate 19.7 (*)    TCO2 21 (*)    Acid-base deficit 8.0 (*)    Sodium 134 (*)    All other components within normal limits  CBG MONITORING, ED - Abnormal; Notable for the following components:   Glucose-Capillary 297 (*)    All other components within normal limits  RESP PANEL BY RT-PCR (FLU A&B, COVID) ARPGX2  URINALYSIS, ROUTINE W REFLEX MICROSCOPIC  HIV ANTIBODY (ROUTINE TESTING W REFLEX)  HEMOGLOBIN A1C  LIPID PANEL  CBG MONITORING, ED    EKG EKG Interpretation  Date/Time:  Sunday September 27 2020 15:21:03 EDT Ventricular Rate:  116 PR Interval:  162 QRS Duration: 90 QT Interval:  344 QTC Calculation: 478 R Axis:   85 Text Interpretation: Sinus tachycardia with Premature supraventricular complexes Low voltage QRS Lateral infarct , age undetermined Abnormal ECG Confirmed by Davonna Belling [phone]) on 09/27/2020 7:10:37 PM  Radiology CT Angio Head W or Wo Contrast  Result Date: 09/27/2020 CLINICAL DATA:  Change in vision EXAM: CT ANGIOGRAPHY HEAD AND NECK TECHNIQUE: Multidetector CT imaging of the head and neck was performed using the standard protocol during bolus administration of intravenous contrast. Multiplanar CT image reconstructions and MIPs were obtained to evaluate the vascular anatomy. Carotid stenosis measurements (when applicable) are obtained utilizing NASCET criteria, using the distal internal carotid diameter as the denominator. CONTRAST:  3mL OMNIPAQUE IOHEXOL 350 MG/ML SOLN COMPARISON:  None. FINDINGS: CTA NECK Aortic arch: Great vessel origins are patent. Right carotid system: Patent. Noncalcified plaque along the common carotid with  less than 50% stenosis. Calcified plaque along the proximal internal carotid with less than 50% stenosis. Left carotid system: Patent. Mixed plaque at the internal carotid origin with less than 50% stenosis. Vertebral arteries: Patent and codominant. Mild plaque at the left vertebral origin. Skeleton: No significant osseous abnormality. Other neck: Unremarkable. Upper chest: Bilateral pleural effusions. Review of the MIP images confirms the above findings CTA HEAD Anterior circulation: Intracranial internal carotid arteries are patent with calcified plaque causing mild stenosis. Anterior and middle cerebral arteries are patent. Posterior circulation: Intracranial vertebral arteries are patent. Basilar artery is patent. Major cerebellar artery origins are patent. Left posterior cerebral artery is patent. No proximal right PCA occlusion. There is some right P3 and P4 irregularity with possible occlusion of a proximal P4 branch noted. Venous sinuses: Patent as allowed by contrast bolus timing. Review of the MIP images confirms the above findings IMPRESSION: There is no proximal intracranial vessel occlusion; specifically no proximal right PCA occlusion. Irregularity more distally with possible proximal P4 branch occlusion. No large vessel occlusion in the neck. Mild plaque at the left vertebral origin. Plaque at the ICA origins causes less than 50% stenosis. Bilateral pleural effusions. Electronically Signed   By: Macy Mis M.D.   On: 09/27/2020 19:56  DG Chest 2 View  Result Date: 09/27/2020 CLINICAL DATA:  Cough and shortness of breath. EXAM: CHEST - 2 VIEW COMPARISON:  Chest x-ray dated Sep 04, 2017. FINDINGS: The heart size and mediastinal contours are within normal limits. Pulmonary vascular congestion. Small bilateral pleural effusions and bibasilar atelectasis. No pneumothorax. No acute osseous abnormality. IMPRESSION: 1. Pulmonary vascular congestion and small bilateral pleural effusions.  Electronically Signed   By: Titus Dubin M.D.   On: 09/27/2020 16:12   CT Head Wo Contrast  Result Date: 09/27/2020 CLINICAL DATA:  Acute stroke suspected. Headache. Left vision changes. EXAM: CT HEAD WITHOUT CONTRAST TECHNIQUE: Contiguous axial images were obtained from the base of the skull through the vertex without intravenous contrast. COMPARISON:  None. FINDINGS: Brain: No subdural, epidural, or subarachnoid hemorrhage. There is an infarct in the right occipital lobe. No other sites of cortical ischemia or infarct identified. The occipital horn of the right lateral ventricle is compressed due to mass effect from the infarct. The ventricles are otherwise normal. No midline shift. Basal cisterns are patent although there is mild narrowing of the quadrigeminal plate cistern on the right due to the infarct. The cerebellum and brainstem are normal. Vascular: Calcified atherosclerosis is seen in the intracranial carotids. Skull: Normal. Negative for fracture or focal lesion. Sinuses/Orbits: No acute finding. Other: None. IMPRESSION: 1. Right occipital lobe infarct consistent with the patient's symptoms. The infarct exerts mild mass effect on the right side of the quadrigeminal plate cistern and significant mass effect on the occipital horn of the right lateral ventricle. There is no midline shift however. There is likely punctate hemorrhage scattered throughout the infarct. No large hematoma. 2. No other abnormalities. Electronically Signed   By: Dorise Bullion III M.D   On: 09/27/2020 19:38   CT Angio Neck W and/or Wo Contrast  Result Date: 09/27/2020 CLINICAL DATA:  Change in vision EXAM: CT ANGIOGRAPHY HEAD AND NECK TECHNIQUE: Multidetector CT imaging of the head and neck was performed using the standard protocol during bolus administration of intravenous contrast. Multiplanar CT image reconstructions and MIPs were obtained to evaluate the vascular anatomy. Carotid stenosis measurements (when  applicable) are obtained utilizing NASCET criteria, using the distal internal carotid diameter as the denominator. CONTRAST:  54mL OMNIPAQUE IOHEXOL 350 MG/ML SOLN COMPARISON:  None. FINDINGS: CTA NECK Aortic arch: Great vessel origins are patent. Right carotid system: Patent. Noncalcified plaque along the common carotid with less than 50% stenosis. Calcified plaque along the proximal internal carotid with less than 50% stenosis. Left carotid system: Patent. Mixed plaque at the internal carotid origin with less than 50% stenosis. Vertebral arteries: Patent and codominant. Mild plaque at the left vertebral origin. Skeleton: No significant osseous abnormality. Other neck: Unremarkable. Upper chest: Bilateral pleural effusions. Review of the MIP images confirms the above findings CTA HEAD Anterior circulation: Intracranial internal carotid arteries are patent with calcified plaque causing mild stenosis. Anterior and middle cerebral arteries are patent. Posterior circulation: Intracranial vertebral arteries are patent. Basilar artery is patent. Major cerebellar artery origins are patent. Left posterior cerebral artery is patent. No proximal right PCA occlusion. There is some right P3 and P4 irregularity with possible occlusion of a proximal P4 branch noted. Venous sinuses: Patent as allowed by contrast bolus timing. Review of the MIP images confirms the above findings IMPRESSION: There is no proximal intracranial vessel occlusion; specifically no proximal right PCA occlusion. Irregularity more distally with possible proximal P4 branch occlusion. No large vessel occlusion in the neck. Mild plaque at  the left vertebral origin. Plaque at the ICA origins causes less than 50% stenosis. Bilateral pleural effusions. Electronically Signed   By: Macy Mis M.D.   On: 09/27/2020 19:56    Procedures .Critical Care  Date/Time: 09/27/2020 9:19 PM Performed by: Nettie Elm, PA-C Authorized by: Nettie Elm,  PA-C   Critical care provider statement:    Critical care time (minutes):  45   Critical care was necessary to treat or prevent imminent or life-threatening deterioration of the following conditions:  Endocrine crisis, dehydration and CNS failure or compromise   Critical care was time spent personally by me on the following activities:  Discussions with consultants, evaluation of patient's response to treatment, examination of patient, ordering and performing treatments and interventions, ordering and review of laboratory studies, ordering and review of radiographic studies, pulse oximetry, re-evaluation of patient's condition, obtaining history from patient or surrogate and review of old charts   Medications Ordered in ED Medications   stroke: mapping our early stages of recovery book (has no administration in time range)  acetaminophen (TYLENOL) tablet 650 mg (has no administration in time range)    Or  acetaminophen (TYLENOL) 160 MG/5ML solution 650 mg (has no administration in time range)    Or  acetaminophen (TYLENOL) suppository 650 mg (has no administration in time range)  aspirin EC tablet 81 mg (has no administration in time range)  furosemide (LASIX) injection 20 mg (has no administration in time range)  insulin aspart (novoLOG) injection 8 Units (8 Units Subcutaneous Given 09/27/20 2028)  iohexol (OMNIPAQUE) 350 MG/ML injection 50 mL (50 mLs Intravenous Contrast Given 09/27/20 1938)  sodium chloride 0.9 % bolus 500 mL (500 mLs Intravenous New Bag/Given 09/27/20 2044)  ondansetron (ZOFRAN) injection 4 mg (4 mg Intravenous Given 09/27/20 2120)    ED Course  I have reviewed the triage vital signs and the nursing notes.  Pertinent labs & imaging results that were available during my care of the patient were reviewed by me and considered in my medical decision making (see chart for details).  70 year old here for evaluation of painless vision loss in left eye with associated headache  which began yesterday he is afebrile, nonseptic, non ill-appearing.  Patient has had grossly palpation to his left eye.  Question possible left field deficit.  Patient has known right arterial occlusion in his right with min deficit.  He is not sure which is changed in size.  He has no neck stiffness or neck rigidity.  He has no meningismus.  However his vision changes he has a nonfocal neuro exam without deficits.  Patient does note persistent cough and intermittent shortness of breath and some orthopnea.  He has trace pitting edema to lower extremities.  Very minimal crackles at bases.  No acute respiratory distress.  No hypoxia.   Work-up started in triage which I personally reviewed and interpreted:  CBC without leukocytosis Metabolic panel sodium 557, potassium 5.4, glucose 435, creatinine 1.63 Chest x-ray with vascular congestion and small bilateral pleural effusions EKG sinus tachycardia with PVC CT head with evidence of old infarct with mass-effect, likely punctate hemorrhage/infarct CTA head and neck without acute large vessel occlusion  Discussed results with large infarct with patient.  Will admit for further management.  Patient does have mild AKI with creatinine 1.63, will give small fluid bolus given he does have some vascular congestion on his chest x-ray.  Question if patient has new onset heart failure however reassuring no acute respiratory distress.  Has  been mildly tachycardic however I have low suspicion for PE, patient has no chest pain, no unilateral leg swelling, redness or warmth.  Hyperglycemia again giving small fluid bolus we will give some insulin.  Low suspicion for DKA at this time, CBG similar to prior PCP office.  VBG with ph 7.24, Bicarb 19.7 BNP 1566>> question new onset HF. Hold on additional IVF at this time.  CONSULT with Dr. Lorrin Goodell with Neurology. Aware of patient. Request admit to medicine for CVA WU  CONSULT with Dr. Alcario Drought with [place] who agrees to  evaluate patient for admission.  The patient appears reasonably stabilized for admission considering the current resources, flow, and capabilities available in the ED at this time, and I doubt any other [facility] requiring further screening and/or treatment in the ED prior to admission.   Patient seen eval by attending, Dr. Alvino Chapel who agrees with above treatment, plan and disposition.     MDM Rules/Calculators/A&P                          Acute onset painless vision loss which occurred approximately 24 hours prior to my evaluation.  CT scan shows large infarct.   Final Clinical Impression(s) / ED Diagnoses Final diagnoses:  Cerebrovascular accident (CVA), unspecified mechanism ([place])  Pulmonary congestion  Hyperglycemia  AKI (acute kidney injury) ([place])  Hyperkalemia  Elevated brain natriuretic peptide (BNP) level    Rx / DC Orders ED Discharge Orders     None        Kameko Hukill A, PA-C 09/27/20 2131    Davonna Belling, MD 09/28/20 1457

## 2020-09-27 NOTE — H&P (Signed)
History and Physical    Greg Robertson VOJ:[id number] DOB: December 31, 1950 DOA: 09/27/2020  PCP: Dettinger, Fransisca Kaufmann, MD  Patient coming from: Home  I have personally briefly reviewed patient's old medical records in [place]  Chief Complaint: L Homonymous hemianopsia  HPI: Greg Robertson is a 70 y.o. male with medical history significant of A.Fib not on AC, DM1 poorly controlled, former smoker, prior R CRAO, HTN.  Pt driving last night, had sudden onset L sided vision loss that is persistent.  Associated headache.  Initially attributed this to low BGL.  At restaurant when this started.  Called family to pick him up.  Took tylenol with improvement in headache.  Vision didn't improve so family brought pt to ED.  No fevers / chills.  Does have BLE edema, he attributes it to "tight socks".  Has cough as well.   ED Course: CT head demonstrates R occipital stroke.  BNP 1500.  CXR with pulm vasc congestion and small effusions.  BGL 435, no AG though.  COVID neg.   Review of Systems: As per HPI, otherwise all review of systems negative.  Past Medical History:  Diagnosis Date   Allergic rhinitis    Anemia    Atrial fibrillation ([place])    Persistent   Cancer of ascending colon, 7cm 05/25/2011   Diabetes mellitus type I ([place])    Glaucoma    Hypertension    Pneumonia 1979 or 1980   Prostate nodule    Substance abuse [facility])     Past Surgical History:  Procedure Laterality Date   broken left shoulder blade and collar bone     FINGER SURGERY  2009   right middle   LAPAROSCOPIC ASSISTED 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     NASAL FRACTURE SURGERY  1968   PORTACATH PLACEMENT  07/01/2011   Procedure: INSERTION PORT-A-CATH;  Surgeon: Adin Hector, MD;  Location: [facility];  Service: General;  Laterality: Left;  Insertion of Port-A-Catheter Left Internal Jugular   TONSILLECTOMY  1957 - approximate     reports that he quit smoking about 27 years ago. His smoking  use included cigarettes. He has a 15.00 pack-year smoking history. He has never used smokeless tobacco. He reports current alcohol use of about 5.0 standard drinks of alcohol per week. He reports current drug use. Drug: Marijuana.  Allergies  Allergen Reactions   Oxycodone Nausea And Vomiting    Family History  Problem Relation Age of Onset   Colon polyps Father    Lung cancer Father    Diabetes Father    Prostate cancer Father    Breast cancer Mother    Pancreatitis Mother        intestinal adhesions   Insomnia Mother    Colon cancer Neg Hx    Rectal cancer Neg Hx      Prior to Admission medications   Medication Sig Start Date End Date Taking? Authorizing Provider  acetaminophen (TYLENOL) 500 MG tablet Take 1,000 mg by mouth every 6 (six) hours as needed for mild pain or headache. Pain   Yes [provider]  Ascorbic Acid (VITAMIN C PO) Take 1 tablet by mouth every morning.    Yes [provider]  glucose blood test strip Use as instructed Patient taking differently: Check blood sugar before meals and hs 04/10/13  Yes Barton Dubois, MD  insulin aspart (NOVOLOG) 100 UNIT/ML injection INJECT 8-15 UNITS THREE TIMES DAILY BEFORE MEALS Patient taking differently: Inject 8-15 Units into  the skin 3 (three) times daily with meals. 04/02/20  Yes Dettinger, Fransisca Kaufmann, MD  insulin degludec (TRESIBA FLEXTOUCH) 100 UNIT/ML FlexTouch Pen Inject 20 Units into the skin every morning. Patient taking differently: Inject 14 Units into the skin every morning. 04/02/20  Yes Dettinger, Fransisca Kaufmann, MD  levocetirizine (XYZAL) 5 MG tablet Take 1 tablet (5 mg total) by mouth daily. Patient taking differently: Take 5 mg by mouth every evening. 04/02/20  Yes Dettinger, Fransisca Kaufmann, MD  lisinopril (ZESTRIL) 10 MG tablet Take 1 tablet (10 mg total) by mouth daily. 04/02/20  Yes Dettinger, Fransisca Kaufmann, MD  Multiple Vitamin (MULTIVITAMIN) tablet Take 1 tablet by mouth every morning.    Yes [provider]  simvastatin (ZOCOR) 40 MG tablet TAKE 1 TABLET BY MOUTH EVERYDAY AT BEDTIME Patient taking differently: Take 40 mg by mouth at bedtime. 04/02/20  Yes Dettinger, Fransisca Kaufmann, MD  triamcinolone (NASACORT) 55 MCG/ACT AERO nasal inhaler INSTILL 1 SPRAY IN EACH NOSTRIL ONCE OR TWICE DAILY AS NEEDED. Patient taking differently: Place 2 sprays into the nose daily. 09/04/17  Yes Chipper Herb, MD  Continuous Blood Gluc Receiver (FREESTYLE LIBRE 2 READER) DEVI USE IN THE MORNING, AT NOON, IN THE EVENING, AND AT BEDTIME AS DIRECTED Dx E10.9 08/25/20   Dettinger, Fransisca Kaufmann, MD  Continuous Blood Gluc Sensor (FREESTYLE LIBRE 2 SENSOR) MISC 1 each by Does not apply route every 14 (fourteen) days 08/25/20   Dettinger, Fransisca Kaufmann, MD  Tafluprost, PF, (ZIOPTAN) 0.0015 % SOLN Place 1 drop into both eyes at bedtime.  Patient not taking: No sig reported    [provider]    Physical Exam: Vitals:   09/27/20 1516 09/27/20 1829 09/27/20 2045  BP: (!) 168/101 (!) 155/97 (!) 174/98  Pulse: (!) 118 (!) 102   Resp: 15 16 (!) 25  Temp: 99.4 F (37.4 C) 98.9 F (37.2 C)   TempSrc:  Oral   SpO2: 98% 99%     Constitutional: NAD, calm, comfortable Eyes: PERRL, lids and conjunctivae normal ENMT: Mucous membranes are moist. Posterior pharynx clear of any exudate or lesions.Normal dentition.  Neck: normal, supple, no masses, no thyromegaly Respiratory: Crackles, B bases Cardiovascular: Tachycardic  Abdomen: no tenderness, no masses palpated. No hepatosplenomegaly. Bowel sounds positive.  Musculoskeletal: no clubbing / cyanosis. No joint deformity upper and lower extremities. Good ROM, no contractures. Normal muscle tone.  Skin: no rashes, lesions, ulcers. No induration Neurologic: Central vision loss to R eye, has L homonymous hemianopsia Psychiatric: Normal judgment and insight. Alert and oriented x 3. Normal mood.    Labs on Admission: I have personally reviewed following labs and imaging  studies  CBC: Recent Labs  Lab 09/27/20 1559 09/27/20 2115  WBC 8.6  --   HGB 14.3 17.0  HCT 43.5 50.0  MCV 92.9  --   PLT 148*  --    Basic Metabolic Panel: Recent Labs  Lab 09/27/20 1559 09/27/20 2115  NA 132* 134*  K 5.2* 3.9  CL 103  --   CO2 19*  --   GLUCOSE 435*  --   BUN 25*  --   CREATININE 1.63*  --   CALCIUM 9.1  --    GFR: CrCl cannot be calculated (Unknown ideal weight.). Liver Function Tests: No results for input(s): AST, ALT, ALKPHOS, BILITOT, PROT, ALBUMIN in the last 168 hours. No results for input(s): LIPASE, AMYLASE in the last 168 hours. No results for input(s): AMMONIA in the last 168 hours. Coagulation  Profile: No results for input(s): INR, PROTIME in the last 168 hours. Cardiac Enzymes: No results for input(s): CKTOTAL, CKMB, CKMBINDEX, TROPONINI in the last 168 hours. BNP (last 3 results) No results for input(s): PROBNP in the last 8760 hours. HbA1C: No results for input(s): HGBA1C in the last 72 hours. CBG: Recent Labs  Lab 09/27/20 1554 09/27/20 2042  GLUCAP 429* 297*   Lipid Profile: No results for input(s): CHOL, HDL, LDLCALC, TRIG, CHOLHDL, LDLDIRECT in the last 72 hours. Thyroid Function Tests: No results for input(s): TSH, T4TOTAL, FREET4, T3FREE, THYROIDAB in the last 72 hours. Anemia Panel: No results for input(s): VITAMINB12, FOLATE, FERRITIN, TIBC, IRON, RETICCTPCT in the last 72 hours. Urine analysis:    Component Value Date/Time   COLORURINE YELLOW 04/06/2013 2058   APPEARANCEUR Clear 09/04/2017 1304   LABSPEC 1.024 04/06/2013 2058   PHURINE 5.0 04/06/2013 2058   GLUCOSEU 2+ (A) 09/04/2017 1304   HGBUR NEGATIVE 04/06/2013 2058   BILIRUBINUR Negative 09/04/2017 1304   KETONESUR >80 (A) 04/06/2013 2058   PROTEINUR Negative 09/04/2017 1304   PROTEINUR NEGATIVE 04/06/2013 2058   UROBILINOGEN negative 01/21/2015 1104   UROBILINOGEN 0.2 04/06/2013 2058   NITRITE Negative 09/04/2017 1304   NITRITE NEGATIVE 04/06/2013  2058   LEUKOCYTESUR Negative 09/04/2017 1304    Radiological Exams on Admission: CT Angio Head W or Wo Contrast  Result Date: 09/27/2020 CLINICAL DATA:  Change in vision EXAM: CT ANGIOGRAPHY HEAD AND NECK TECHNIQUE: Multidetector CT imaging of the head and neck was performed using the standard protocol during bolus administration of intravenous contrast. Multiplanar CT image reconstructions and MIPs were obtained to evaluate the vascular anatomy. Carotid stenosis measurements (when applicable) are obtained utilizing NASCET criteria, using the distal internal carotid diameter as the denominator. CONTRAST:  15mL OMNIPAQUE IOHEXOL 350 MG/ML SOLN COMPARISON:  None. FINDINGS: CTA NECK Aortic arch: Great vessel origins are patent. Right carotid system: Patent. Noncalcified plaque along the common carotid with less than 50% stenosis. Calcified plaque along the proximal internal carotid with less than 50% stenosis. Left carotid system: Patent. Mixed plaque at the internal carotid origin with less than 50% stenosis. Vertebral arteries: Patent and codominant. Mild plaque at the left vertebral origin. Skeleton: No significant osseous abnormality. Other neck: Unremarkable. Upper chest: Bilateral pleural effusions. Review of the MIP images confirms the above findings CTA HEAD Anterior circulation: Intracranial internal carotid arteries are patent with calcified plaque causing mild stenosis. Anterior and middle cerebral arteries are patent. Posterior circulation: Intracranial vertebral arteries are patent. Basilar artery is patent. Major cerebellar artery origins are patent. Left posterior cerebral artery is patent. No proximal right PCA occlusion. There is some right P3 and P4 irregularity with possible occlusion of a proximal P4 branch noted. Venous sinuses: Patent as allowed by contrast bolus timing. Review of the MIP images confirms the above findings IMPRESSION: There is no proximal intracranial vessel occlusion;  specifically no proximal right PCA occlusion. Irregularity more distally with possible proximal P4 branch occlusion. No large vessel occlusion in the neck. Mild plaque at the left vertebral origin. Plaque at the ICA origins causes less than 50% stenosis. Bilateral pleural effusions. Electronically Signed   By: Macy Mis M.D.   On: 09/27/2020 19:56   DG Chest 2 View  Result Date: 09/27/2020 CLINICAL DATA:  Cough and shortness of breath. EXAM: CHEST - 2 VIEW COMPARISON:  Chest x-ray dated Sep 04, 2017. FINDINGS: The heart size and mediastinal contours are within normal limits. Pulmonary vascular congestion. Small bilateral pleural effusions  and bibasilar atelectasis. No pneumothorax. No acute osseous abnormality. IMPRESSION: 1. Pulmonary vascular congestion and small bilateral pleural effusions. Electronically Signed   By: Titus Dubin M.D.   On: 09/27/2020 16:12   CT Head Wo Contrast  Result Date: 09/27/2020 CLINICAL DATA:  Acute stroke suspected. Headache. Left vision changes. EXAM: CT HEAD WITHOUT CONTRAST TECHNIQUE: Contiguous axial images were obtained from the base of the skull through the vertex without intravenous contrast. COMPARISON:  None. FINDINGS: Brain: No subdural, epidural, or subarachnoid hemorrhage. There is an infarct in the right occipital lobe. No other sites of cortical ischemia or infarct identified. The occipital horn of the right lateral ventricle is compressed due to mass effect from the infarct. The ventricles are otherwise normal. No midline shift. Basal cisterns are patent although there is mild narrowing of the quadrigeminal plate cistern on the right due to the infarct. The cerebellum and brainstem are normal. Vascular: Calcified atherosclerosis is seen in the intracranial carotids. Skull: Normal. Negative for fracture or focal lesion. Sinuses/Orbits: No acute finding. Other: None. IMPRESSION: 1. Right occipital lobe infarct consistent with the patient's symptoms. The  infarct exerts mild mass effect on the right side of the quadrigeminal plate cistern and significant mass effect on the occipital horn of the right lateral ventricle. There is no midline shift however. There is likely punctate hemorrhage scattered throughout the infarct. No large hematoma. 2. No other abnormalities. Electronically Signed   By: Dorise Bullion III M.D   On: 09/27/2020 19:38   CT Angio Neck W and/or Wo Contrast  Result Date: 09/27/2020 CLINICAL DATA:  Change in vision EXAM: CT ANGIOGRAPHY HEAD AND NECK TECHNIQUE: Multidetector CT imaging of the head and neck was performed using the standard protocol during bolus administration of intravenous contrast. Multiplanar CT image reconstructions and MIPs were obtained to evaluate the vascular anatomy. Carotid stenosis measurements (when applicable) are obtained utilizing NASCET criteria, using the distal internal carotid diameter as the denominator. CONTRAST:  70mL OMNIPAQUE IOHEXOL 350 MG/ML SOLN COMPARISON:  None. FINDINGS: CTA NECK Aortic arch: Great vessel origins are patent. Right carotid system: Patent. Noncalcified plaque along the common carotid with less than 50% stenosis. Calcified plaque along the proximal internal carotid with less than 50% stenosis. Left carotid system: Patent. Mixed plaque at the internal carotid origin with less than 50% stenosis. Vertebral arteries: Patent and codominant. Mild plaque at the left vertebral origin. Skeleton: No significant osseous abnormality. Other neck: Unremarkable. Upper chest: Bilateral pleural effusions. Review of the MIP images confirms the above findings CTA HEAD Anterior circulation: Intracranial internal carotid arteries are patent with calcified plaque causing mild stenosis. Anterior and middle cerebral arteries are patent. Posterior circulation: Intracranial vertebral arteries are patent. Basilar artery is patent. Major cerebellar artery origins are patent. Left posterior cerebral artery is  patent. No proximal right PCA occlusion. There is some right P3 and P4 irregularity with possible occlusion of a proximal P4 branch noted. Venous sinuses: Patent as allowed by contrast bolus timing. Review of the MIP images confirms the above findings IMPRESSION: There is no proximal intracranial vessel occlusion; specifically no proximal right PCA occlusion. Irregularity more distally with possible proximal P4 branch occlusion. No large vessel occlusion in the neck. Mild plaque at the left vertebral origin. Plaque at the ICA origins causes less than 50% stenosis. Bilateral pleural effusions. Electronically Signed   By: Macy Mis M.D.   On: 09/27/2020 19:56    EKG: Independently reviewed.  S.Tach 116  Assessment/Plan Principal Problem:   Acute  ischemic stroke ([place]) Active Problems:   DM type 1 (diabetes mellitus, type 1) (HCC)   Acute on chronic diastolic CHF (congestive heart failure) (HCC)   Hypertension associated with diabetes ([place])   A-fib (HCC)   AKI (acute kidney injury) ([place])   Left homonymous hemianopsia    Acute ischemic stroke - Stroke pathway Tele monitor PT/OT/SLP MRI brain 2d echo ASA 81 for now, eventually will need anticoagulation Willing to try "something other than Eliquis" Neuro consult Acute on chronic diastolic CHF - Lasix 20mg  IV x1 now Strict intake and output 2d echo May require additional diuretics re-ordered in AM depending on response Start BB when out of the permissive hypertensive window Resume ACEi when out of permissive hypertensive window (and if renal fxn looks good). AKI - vs development of CKD in setting of uncontrolled DM and HTN Creat of 1.6 today up slightly from 1.48 in May.  Was 1.1 back in Dec of last year. Check UA Getting 20mg  lasix for CHF as noted above Lisinopril on hold as noted below Repeat BMP in AM HTN - Holding home BP meds and not starting BB due to allowing permissive HTN in setting of acute ischemic stroke. PAF  - Needs AC started eventually after out of acute stroke time window as noted above  DVT prophylaxis: SCDs - peticheal hemorrhages of stroke on CT Code Status: Full Family Communication: No family in room Disposition Plan: Home vs rehab after stroke work up [employer] called: Neuro Admission status: Admit to inpatient  Severity of Illness: The appropriate patient status for this patient is INPATIENT. Inpatient status is judged to be reasonable and necessary in order to provide the required intensity of service to ensure the patient's safety. The patient's presenting symptoms, physical exam findings, and initial radiographic and laboratory data in the context of their chronic comorbidities is felt to place them at high risk for further clinical deterioration. Furthermore, it is not anticipated that the patient will be medically stable for discharge from the hospital within 2 midnights of admission. The following factors support the patient status of inpatient.   IP status due to acute ischemic stroke with L homonymous hemianopsia.   * I certify that at the point of admission it is my clinical judgment that the patient will require inpatient hospital care spanning beyond 2 midnights from the point of admission due to high intensity of service, high risk for further deterioration and high frequency of surveillance required.*   Vedanth Sirico M. DO [facility]  How to contact the [facility] Attending or Consulting provider [place] or covering provider during after hours [place], for this patient?  Check the care team in [facility] and look for a) attending/consulting [facility] provider listed and b) the [facility] team listed Log into [email or web address]  Amion Physician Scheduling and messaging for groups and whole hospitals  On call and physician scheduling software for group practices, residents, hospitalists and other medical providers for call, clinic, rotation and shift schedules. OnCall [employer] is a hospital-wide  system for scheduling doctors and paging doctors on call. EasyPlot is for scientific plotting and data analysis.  [email or web address]  and use [place]'s universal password to access. If you do not have the password, please contact the hospital operator.  Locate the [facility] provider you are looking for under [facility] and page to a number that you can be directly reached. If you still have difficulty reaching the provider, please page the [facility] (Director on Call) for the Hospitalists listed on  amion for assistance.  09/27/2020, 10:35 PM

## 2020-09-27 NOTE — Consult Note (Addendum)
NEUROLOGY CONSULTATION NOTE   Date of service: September 27, 2020 Patient Name: Greg Robertson MRN:  [id number] DOB:  [birth year] Reason for consult: "R occipital stroke" Requesting Provider: Davonna Belling, MD _ _ _   _ __   _ __ _ _  __ __   _ __   __ _  History of Present Illness  Greg Robertson is a 70 y.o. male with PMH significant for Afibb not on [facility], DM2 poorly controlled, former smoker, prior Right central retinal artery occlusion, HTN who was driving last night and had sudden onset vision loss on the left that is persistent with some headache. Initially attributed this to low blood sugar. He was at [employer] when this started. Called his family to pick him up. Took some tylenol with improvement in headache. Vision did not improve so he asked his family and was brought in to the ED.   CTH demonstrated a R PCA territory stroke with some mass effect.  He tried eliquis in the past and that gave him nose boils so he quit taking it. Open to trying other anticoagulation.  mRS: 0 tPA/Thrombectomy: outside window LKW: 09/26/20 at 1800 NIHSS components Score: Comment  1a Level of Conscious 0[x]  1[]  2[]  3[]      1b LOC Questions 0[x]  1[]  2[]       1c LOC Commands 0[x]  1[]  2[]       2 Best Gaze 0[x]  1[]  2[]       3 Visual 0[]  1[]  2[x]  3[]      4 Facial Palsy 0[x]  1[]  2[]  3[]      5a Motor Arm - left 0[x]  1[]  2[]  3[]  4[]  UN[]    5b Motor Arm - Right 0[x]  1[]  2[]  3[]  4[]  UN[]    6a Motor Leg - Left 0[x]  1[]  2[]  3[]  4[]  UN[]    6b Motor Leg - Right 0[x]  1[]  2[]  3[]  4[]  UN[]    7 Limb Ataxia 0[x]  1[]  2[]  3[]  UN[]     8 Sensory 0[x]  1[]  2[]  UN[]      9 Best Language 0[x]  1[]  2[]  3[]      10 Dysarthria 0[x]  1[]  2[]  UN[]      11 Extinct. and Inattention 0[x]  1[]  2[]       TOTAL: 2       ROS   Constitutional Denies weight loss, fever and chills.   HEENT Endorses changes in vision but not hearing.  Respiratory Denies SOB endorses cough.   CV Denies palpitations and CP   GI Denies abdominal  pain, nausea, vomiting and diarrhea.  GU Denies dysuria and urinary frequency.  MSK Denies myalgia and joint pain.   Skin Denies rash and pruritus.   Neurological Endorses headache but not syncope.   Psychiatric Denies recent changes in mood. Denies anxiety and depression.    Past History   Past Medical History:  Diagnosis Date   Allergic rhinitis    Anemia    Atrial fibrillation ([place])    Persistent   Cancer of ascending colon, 7cm 05/25/2011   Diabetes mellitus type I ([place])    Glaucoma    Hypertension    Pneumonia 1979 or 1980   Prostate nodule    Substance abuse [facility])    Past Surgical History:  Procedure Laterality Date   broken left shoulder blade and collar bone     FINGER SURGERY  2009   right middle   LAPAROSCOPIC ASSISTED 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     NASAL FRACTURE SURGERY  1968   PORTACATH PLACEMENT  07/01/2011  Procedure: INSERTION PORT-A-CATH;  Surgeon: Adin Hector, MD;  Location: [facility];  Service: General;  Laterality: Left;  Insertion of Port-A-Catheter Left Internal Jugular   TONSILLECTOMY  1957 - approximate   Family History  Problem Relation Age of Onset   Colon polyps Father    Lung cancer Father    Diabetes Father    Prostate cancer Father    Breast cancer Mother    Pancreatitis Mother        intestinal adhesions   Insomnia Mother    Colon cancer Neg Hx    Rectal cancer Neg Hx    Social History   Socioeconomic History   Marital status: Single    Spouse name: Not on file   Number of children: 0   Years of education: 16   Highest education level: Bachelor's degree (e.g., BA, AB, BS)  Occupational History   Occupation: Retired    Comment: Press photographer  Tobacco Use   Smoking status: Former    Packs/day: 1.00    Years: 15.00    Pack years: 15.00    Types: Cigarettes    Quit date: 04/18/1993    Years since quitting: 27.4   Smokeless tobacco: Never   Tobacco comments:    marijuana every night   Vaping Use   Vaping Use: Never  used  Substance and Sexual Activity   Alcohol use: Yes    Alcohol/week: 5.0 standard drinks    Types: 3 Cans of beer, 2 Shots of liquor per week    Comment: 1 drink every 2 days   Drug use: Yes    Types: Marijuana    Comment: once a night marijuana   Sexual activity: Not Currently  Other Topics Concern   Not on file  Social History Narrative   Lives alone.  Retired from [employer].    Social Determinants of Health   Financial Resource Strain: Low Risk    Difficulty of Paying Living Expenses: Not hard at all  Food Insecurity: No Food Insecurity   Worried About Charity fundraiser in the Last Year: Never true   [place] in the Last Year: Never true  Transportation Needs: No Transportation Needs   Lack of Transportation (Medical): No   Lack of Transportation (Non-Medical): No  Physical Activity: Sufficiently Active   Days of Exercise per Week: 5 days   Minutes of Exercise per Session: 40 min  Stress: No Stress Concern Present   Feeling of Stress : Only a little  Social Connections: Moderately Isolated   Frequency of Communication with Friends and Family: More than three times a week   Frequency of Social Gatherings with Friends and Family: Three times a week   Attends Religious Services: 1 to 4 times per year   Active Member of Clubs or Organizations: No   Attends Archivist Meetings: Never   Marital Status: Never married   Allergies  Allergen Reactions   Oxycodone Nausea And Vomiting    Medications  (Not in a hospital admission)    Vitals   Vitals:   09/27/20 1516 09/27/20 1829  BP: (!) 168/101 (!) 155/97  Pulse: (!) 118 (!) 102  Resp: 15 16  Temp: 99.4 F (37.4 C) 98.9 F (37.2 C)  TempSrc:  Oral  SpO2: 98% 99%     There is no height or weight on file to calculate BMI.  Physical Exam   General: Laying comfortably in bed; in no acute distress.  HENT: Normal oropharynx  and mucosa. Normal external appearance of ears and nose.  Neck:  Supple, no pain or tenderness  CV: No JVD. No peripheral edema.  Pulmonary: Symmetric Chest rise. Normal respiratory effort.  Abdomen: Soft to touch, non-tender.  Ext: No cyanosis, edema, or deformity  Skin: No rash. Normal palpation of skin.   Musculoskeletal: Normal digits and nails by inspection. No clubbing.   Neurologic Examination  Mental status/Cognition: Alert, oriented to self, place, month and year, good attention.  Speech/language: Fluent, comprehension intact, object naming intact, repetition intact. Cranial nerves:   CN II Pupils equal and reactive to light, Left homonymous Hemianopsia.   CN III,IV,VI EOM intact, no gaze preference or deviation, no nystagmus    CN V normal sensation in V1, V2, and V3 segments bilaterally    CN VII no asymmetry, no nasolabial fold flattening    CN VIII normal hearing to speech    CN IX & X normal palatal elevation, no uvular deviation    CN XI 5/5 head turn and 5/5 shoulder shrug bilaterally    CN XII midline tongue protrusion    Motor:  Muscle bulk: normal, tone normal, pronator drift none tremor none Mvmt Root Nerve  Muscle Right Left Comments  SA C5/6 Ax Deltoid 5 5   EF C5/6 Mc Biceps 5 5   EE C6/7/8 Rad Triceps 5 5   WF C6/7 Med FCR     WE C7/8 PIN ECU     F Ab C8/T1 U ADM/FDI 4+ 5   HF L1/2/3 Fem Illopsoas 5 5   KE L2/3/4 Fem Quad 5 5   DF L4/5 D Peron Tib Ant 5 5   PF S1/2 Tibial Grc/Sol 5 5    Reflexes:  Right Left Comments  Pectoralis      Biceps (C5/6) 2 2   Brachioradialis (C5/6) 2 2    Triceps (C6/7) 2 2    Patellar (L3/4) 2 2    Achilles (S1)      Hoffman      Plantar     Jaw jerk    Sensation:  Light touch intact   Pin prick    Temperature    Vibration   Proprioception    Coordination/Complex Motor:  - Finger to Nose intact - Heel to shin intact - Rapid alternating movement are normal - Gait:did not assess, high risk for falls given his vision,  Labs   CBC:  Recent Labs  Lab 09/27/20 1559   WBC 8.6  HGB 14.3  HCT 43.5  MCV 92.9  PLT 148*    Basic Metabolic Panel:  Lab Results  Component Value Date   NA 132 (L) 09/27/2020   K 5.2 (H) 09/27/2020   CO2 19 (L) 09/27/2020   GLUCOSE 435 (H) 09/27/2020   BUN 25 (H) 09/27/2020   CREATININE 1.63 (H) 09/27/2020   CALCIUM 9.1 09/27/2020   GFRNONAA 45 (L) 09/27/2020   GFRAA 72 04/02/2020   Lipid Panel:  Lab Results  Component Value Date   LDLCALC 84 08/24/2020   HgbA1c:  Lab Results  Component Value Date   HGBA1C 8.5 (H) 08/24/2020   Urine Drug Screen: No results found for: LABOPIA, COCAINSCRNUR, LABBENZ, AMPHETMU, THCU, LABBARB  Alcohol Level No results found for: [place]  CT Head without contrast: R Occipital stroke with mass effect.  CT angio Head and Neck with contrast: No LVO  MRI Brain: pending Impression   Greg Robertson is a 70 y.o. male with PMH significant for Afibb not  on [facility], DM2 poorly controlled, prior Right central retinal artery occlusion, HTN who was driving last night and had sudden onset vision loss on the left that is persistent with some headache and found to have a large R occipital stroke with mild mass effect. His neurologic examination is notable for L homonymous hemianopsia.  Primary Diagnosis:  Cerebral infarction due to embolism of  right posterior cerebral artery.    Secondary Diagnosis: Essential (primary) hypertension, Paroxysmal atrial fibrillation, and Type 2 diabetes mellitus with hyperglycemia   Recommendations  Plan:   - Frequent Neuro checks per stroke unit protocol - Recommend brain imaging with MRI Brain without contrast - Recommend obtaining TTE - Recommend obtaining Lipid panel with LDL - Please start statin if LDL > 70 - Recommend HbA1c - Antithrombotic - Aspirin 81mg  daily for now. Will need anticoagulation in the next few days. When he is started on Anticoagulation, Aspirin and DVT prophylaxis should be stopped. - Recommend DVT ppx - SBP goal - permissive  hypertension first 24 h < 220/110. Held home meds.  - Recommend Telemetry monitoring for arrythmia - Recommend bedside swallow screen prior to PO intake. - Stroke education booklet - Recommend PT/OT/SLP consult - Recommend Urine Tox screen.  ______________________________________________________________________   Thank you for the opportunity to take part in the care of this patient. If you have any further questions, please contact the neurology consultation attending.  Signed,  [place] Pager Number [phone] _ _ _   _ __   _ __ _ _  __ __   _ __   __ _

## 2020-09-27 NOTE — ED Triage Notes (Signed)
Pt here pov with reports of change in vision onset yesterday evening. Pt states he thought his blood sugar was low when this event happened so he ate and there was no improvement in vision. Pt with no facial droop, no arm drift, no unilateral weakness noted. Pt also complains of cough with intermittent shob for "a while".

## 2020-09-28 ENCOUNTER — Inpatient Hospital Stay (HOSPITAL_COMMUNITY): Payer: Medicare Other

## 2020-09-28 ENCOUNTER — Other Ambulatory Visit: Payer: Self-pay

## 2020-09-28 DIAGNOSIS — I6389 Other cerebral infarction: Secondary | ICD-10-CM

## 2020-09-28 DIAGNOSIS — I639 Cerebral infarction, unspecified: Secondary | ICD-10-CM

## 2020-09-28 LAB — LIPID PANEL
Cholesterol: 201 mg/dL — ABNORMAL HIGH (ref 0–200)
HDL: 62 mg/dL (ref >40)
LDL Cholesterol: 128 mg/dL — ABNORMAL HIGH (ref 0–99)
Total CHOL/HDL Ratio: 3.2 RATIO
Triglycerides: 55 mg/dL (ref <150)
VLDL: 11 mg/dL (ref 0–40)

## 2020-09-28 LAB — BASIC METABOLIC PANEL
Anion gap: 8 (ref 5–15)
BUN: 19 mg/dL (ref 8–23)
CO2: 24 mmol/L (ref 22–32)
Calcium: 8.9 mg/dL (ref 8.9–10.3)
Chloride: 102 mmol/L (ref 98–111)
Creatinine, Ser: 1.45 mg/dL — ABNORMAL HIGH (ref 0.61–1.24)
GFR, Estimated: 52 mL/min — ABNORMAL LOW (ref >60)
Glucose, Bld: 123 mg/dL — ABNORMAL HIGH (ref 70–99)
Potassium: 4.2 mmol/L (ref 3.5–5.1)
Sodium: 134 mmol/L — ABNORMAL LOW (ref 135–145)

## 2020-09-28 LAB — ECHOCARDIOGRAM COMPLETE
AR max vel: 2.28 cm2
AV Area VTI: 2.1 cm2
AV Area mean vel: 2.01 cm2
AV Mean grad: 2.5 mmHg
AV Peak grad: 4 mmHg
Ao pk vel: 1 m/s
Area-P 1/2: 7.44 cm2
Height: 75 in
S' Lateral: 3.8 cm
Weight: 2752 oz

## 2020-09-28 LAB — GLUCOSE, CAPILLARY
Glucose-Capillary: 84 mg/dL (ref 70–99)
Glucose-Capillary: 243 mg/dL — ABNORMAL HIGH (ref 70–99)

## 2020-09-28 LAB — CBG MONITORING, ED
Glucose-Capillary: 171 mg/dL — ABNORMAL HIGH (ref 70–99)
Glucose-Capillary: 342 mg/dL — ABNORMAL HIGH (ref 70–99)
Glucose-Capillary: 165 mg/dL — ABNORMAL HIGH (ref 70–99)

## 2020-09-28 LAB — HEMOGLOBIN A1C
Hgb A1c MFr Bld: 8.7 % — ABNORMAL HIGH (ref 4.8–5.6)
Mean Plasma Glucose: 203 mg/dL

## 2020-09-28 LAB — HIV ANTIBODY (ROUTINE TESTING W REFLEX): HIV Screen 4th Generation wRfx: NONREACTIVE

## 2020-09-28 MED ORDER — INSULIN GLARGINE 100 UNIT/ML ~~LOC~~ SOLN
14.0000 [IU] | Freq: Two times a day (BID) | SUBCUTANEOUS | Status: DC
  Filled 2020-09-28 (×2): qty 0.14

## 2020-09-28 MED ORDER — SODIUM CHLORIDE 0.9 % IV BOLUS
250.0000 mL | Freq: Once | INTRAVENOUS | Status: AC
  Administered 2020-09-28: 250 mL via INTRAVENOUS

## 2020-09-28 MED ORDER — ATORVASTATIN CALCIUM 40 MG PO TABS
40.0000 mg | ORAL_TABLET | Freq: Every day | ORAL | Status: DC
  Administered 2020-09-28 – 2020-10-01 (×4): 40 mg via ORAL
  Filled 2020-09-28 (×4): qty 1

## 2020-09-28 MED ORDER — CLOPIDOGREL BISULFATE 75 MG PO TABS
75.0000 mg | ORAL_TABLET | Freq: Every day | ORAL | Status: DC
  Administered 2020-09-28: 75 mg via ORAL
  Filled 2020-09-28: qty 1

## 2020-09-28 MED ORDER — INSULIN GLARGINE 100 UNIT/ML ~~LOC~~ SOLN
14.0000 [IU] | Freq: Every day | SUBCUTANEOUS | Status: DC
  Filled 2020-09-28: qty 0.14

## 2020-09-28 MED ORDER — INSULIN GLARGINE 100 UNIT/ML ~~LOC~~ SOLN
10.0000 [IU] | Freq: Two times a day (BID) | SUBCUTANEOUS | Status: DC
  Administered 2020-09-28 – 2020-09-29 (×2): 10 [IU] via SUBCUTANEOUS
  Filled 2020-09-28 (×4): qty 0.1

## 2020-09-28 MED ORDER — ENOXAPARIN SODIUM 40 MG/0.4ML IJ SOSY
40.0000 mg | PREFILLED_SYRINGE | INTRAMUSCULAR | Status: DC
  Administered 2020-09-28 – 2020-09-30 (×3): 40 mg via SUBCUTANEOUS
  Filled 2020-09-28 (×3): qty 0.4

## 2020-09-28 NOTE — Progress Notes (Signed)
[facility] PROGRESS NOTE    Progress Note  Greg Robertson  JSE:[id number] DOB: Apr 11, 1951 DOA: 09/27/2020 PCP: Dettinger, Fransisca Kaufmann, MD     Brief Narrative:   Greg Robertson is an 70 y.o. male past medical history of atrial fibrillation not on anticoagulation, poorly controlled diabetes mellitus type I former smoker who started having 1 day prior to admission of sudden onset left-sided vision which is persistent, in the ED CT of the head showed a right occipital stroke mild mass affect on the cistern and the occipital right lateral horn    Assessment/Plan:   Acute ischemic stroke ([place]): HgbA1c pending, fasting lipid panel LDL greater than 100 HDL greater than 40 on lipitor MRI, MRA of the brain without contrast pending PT, OT, Speech consult pending Transthoracic Echo, pending Start patient on ASA 81mg  daily and plavix 75mg  daily  BP goal: permissive HTN upto 220/120 mmHg Telemetry monitoring with no events, twelve-lead EKG shows sinus tachycardia with PACs, normal axis nonspecific T wave changes.  DM type 1 (diabetes mellitus, type 1) (HCC) Uncontrolled trending up we will start him on long-acting insulin continue sliding scale and CBGs every 4.  Chronic diastolic CHF (congestive heart failure) (HCC) Lowest weight is around 7778 kg which is what he weighs on admission. He has lower extremity edema but has no JVD and appears euvolemic on physical exam lungs are clear chest x-ray does not show an infiltrate he is not in acute decompensated systolic heart failure DC IV Lasix start him on IV fluids we will give a bolus of 500 cc.  Essential hypertension associated with diabetes ([place]) Holding antihypertensive medications to allow permissive hypertension.  Paroxysmal  A-fib (HCC) Sinus tachycardia continue aspirin and Plavix continue to monitor on telemetry. He relates he did stop taking Eliquis as he was having boils in his nose.  AKI (acute kidney injury)  ([place]) With a baseline of around 1.1-1.3 on admission 1.6 given IV Lasix. Stop Lasix and start on normal saline allow oral hydration and recheck basic metabolic panel in the morning.    DVT prophylaxis: lovenox Family Communication:none Status is: Inpatient  Remains inpatient appropriate because:Hemodynamically unstable  Dispo: The patient is from: Home              Anticipated d/c is to: Home              Patient currently is not medically stable to d/c.   Difficult to place patient No        Code Status:     Code Status Orders  (From admission, onward)           Start     Ordered   09/27/20 2128  Full code  Continuous        09/27/20 2130           Code Status History     Date Active Date Inactive Code Status Order ID Comments User Context   04/06/2013 2323 04/10/2013 2145 Full Code [id number]  Theressa Millard, MD Inpatient   03/10/2012 0340 03/14/2012 1824 Full Code [id number]  Angelica Ran, MD Inpatient         IV Access:   Peripheral IV   Procedures and diagnostic studies:   CT Angio Head W or Wo Contrast  Result Date: 09/27/2020 CLINICAL DATA:  Change in vision EXAM: CT ANGIOGRAPHY HEAD AND NECK TECHNIQUE: Multidetector CT imaging of the head and neck was performed using the standard protocol during bolus administration of intravenous  contrast. Multiplanar CT image reconstructions and MIPs were obtained to evaluate the vascular anatomy. Carotid stenosis measurements (when applicable) are obtained utilizing NASCET criteria, using the distal internal carotid diameter as the denominator. CONTRAST:  35mL OMNIPAQUE IOHEXOL 350 MG/ML SOLN COMPARISON:  None. FINDINGS: CTA NECK Aortic arch: Great vessel origins are patent. Right carotid system: Patent. Noncalcified plaque along the common carotid with less than 50% stenosis. Calcified plaque along the proximal internal carotid with less than 50% stenosis. Left carotid system: Patent. Mixed plaque at  the internal carotid origin with less than 50% stenosis. Vertebral arteries: Patent and codominant. Mild plaque at the left vertebral origin. Skeleton: No significant osseous abnormality. Other neck: Unremarkable. Upper chest: Bilateral pleural effusions. Review of the MIP images confirms the above findings CTA HEAD Anterior circulation: Intracranial internal carotid arteries are patent with calcified plaque causing mild stenosis. Anterior and middle cerebral arteries are patent. Posterior circulation: Intracranial vertebral arteries are patent. Basilar artery is patent. Major cerebellar artery origins are patent. Left posterior cerebral artery is patent. No proximal right PCA occlusion. There is some right P3 and P4 irregularity with possible occlusion of a proximal P4 branch noted. Venous sinuses: Patent as allowed by contrast bolus timing. Review of the MIP images confirms the above findings IMPRESSION: There is no proximal intracranial vessel occlusion; specifically no proximal right PCA occlusion. Irregularity more distally with possible proximal P4 branch occlusion. No large vessel occlusion in the neck. Mild plaque at the left vertebral origin. Plaque at the ICA origins causes less than 50% stenosis. Bilateral pleural effusions. Electronically Signed   By: Macy Mis M.D.   On: 09/27/2020 19:56   DG Chest 2 View  Result Date: 09/27/2020 CLINICAL DATA:  Cough and shortness of breath. EXAM: CHEST - 2 VIEW COMPARISON:  Chest x-ray dated Sep 04, 2017. FINDINGS: The heart size and mediastinal contours are within normal limits. Pulmonary vascular congestion. Small bilateral pleural effusions and bibasilar atelectasis. No pneumothorax. No acute osseous abnormality. IMPRESSION: 1. Pulmonary vascular congestion and small bilateral pleural effusions. Electronically Signed   By: Titus Dubin M.D.   On: 09/27/2020 16:12   CT Head Wo Contrast  Result Date: 09/27/2020 CLINICAL DATA:  Acute stroke suspected.  Headache. Left vision changes. EXAM: CT HEAD WITHOUT CONTRAST TECHNIQUE: Contiguous axial images were obtained from the base of the skull through the vertex without intravenous contrast. COMPARISON:  None. FINDINGS: Brain: No subdural, epidural, or subarachnoid hemorrhage. There is an infarct in the right occipital lobe. No other sites of cortical ischemia or infarct identified. The occipital horn of the right lateral ventricle is compressed due to mass effect from the infarct. The ventricles are otherwise normal. No midline shift. Basal cisterns are patent although there is mild narrowing of the quadrigeminal plate cistern on the right due to the infarct. The cerebellum and brainstem are normal. Vascular: Calcified atherosclerosis is seen in the intracranial carotids. Skull: Normal. Negative for fracture or focal lesion. Sinuses/Orbits: No acute finding. Other: None. IMPRESSION: 1. Right occipital lobe infarct consistent with the patient's symptoms. The infarct exerts mild mass effect on the right side of the quadrigeminal plate cistern and significant mass effect on the occipital horn of the right lateral ventricle. There is no midline shift however. There is likely punctate hemorrhage scattered throughout the infarct. No large hematoma. 2. No other abnormalities. Electronically Signed   By: Dorise Bullion III M.D   On: 09/27/2020 19:38   CT Angio Neck W and/or Wo Contrast  Result Date:  09/27/2020 CLINICAL DATA:  Change in vision EXAM: CT ANGIOGRAPHY HEAD AND NECK TECHNIQUE: Multidetector CT imaging of the head and neck was performed using the standard protocol during bolus administration of intravenous contrast. Multiplanar CT image reconstructions and MIPs were obtained to evaluate the vascular anatomy. Carotid stenosis measurements (when applicable) are obtained utilizing NASCET criteria, using the distal internal carotid diameter as the denominator. CONTRAST:  12mL OMNIPAQUE IOHEXOL 350 MG/ML SOLN  COMPARISON:  None. FINDINGS: CTA NECK Aortic arch: Great vessel origins are patent. Right carotid system: Patent. Noncalcified plaque along the common carotid with less than 50% stenosis. Calcified plaque along the proximal internal carotid with less than 50% stenosis. Left carotid system: Patent. Mixed plaque at the internal carotid origin with less than 50% stenosis. Vertebral arteries: Patent and codominant. Mild plaque at the left vertebral origin. Skeleton: No significant osseous abnormality. Other neck: Unremarkable. Upper chest: Bilateral pleural effusions. Review of the MIP images confirms the above findings CTA HEAD Anterior circulation: Intracranial internal carotid arteries are patent with calcified plaque causing mild stenosis. Anterior and middle cerebral arteries are patent. Posterior circulation: Intracranial vertebral arteries are patent. Basilar artery is patent. Major cerebellar artery origins are patent. Left posterior cerebral artery is patent. No proximal right PCA occlusion. There is some right P3 and P4 irregularity with possible occlusion of a proximal P4 branch noted. Venous sinuses: Patent as allowed by contrast bolus timing. Review of the MIP images confirms the above findings IMPRESSION: There is no proximal intracranial vessel occlusion; specifically no proximal right PCA occlusion. Irregularity more distally with possible proximal P4 branch occlusion. No large vessel occlusion in the neck. Mild plaque at the left vertebral origin. Plaque at the ICA origins causes less than 50% stenosis. Bilateral pleural effusions. Electronically Signed   By: Macy Mis M.D.   On: 09/27/2020 19:56   MR BRAIN WO CONTRAST  Result Date: 09/28/2020 CLINICAL DATA:  Initial evaluation for acute stroke, visual disturbance. EXAM: MRI HEAD WITHOUT CONTRAST TECHNIQUE: Multiplanar, multiecho pulse sequences of the brain and surrounding structures were obtained without intravenous contrast. COMPARISON:   Prior CTs from earlier the same day. FINDINGS: Brain: Cerebral volume within normal limits for age. Small remote lacunar infarct at the left thalamus. No other significant small vessel disease for age. Moderately large evolving acute to early subacute right PCA distribution infarct involving the right temporal occipital region is seen, corresponding with abnormality on prior CT (series 5, image 70). Extensive susceptibility artifact seen throughout the area of infarction, consistent with associated hemorrhage. No frank intraparenchymal hematoma. Trace blood products seen layering within the occipital horns of both lateral ventricles, likely related to redistribution of small volume associated subarachnoid blood (series 14, image 28). No significant midline shift or regional mass effect. No hydrocephalus or trapping. No other evidence for acute or subacute ischemia. Gray-white matter differentiation otherwise maintained. No other areas of remote cortical infarction. No other evidence for acute or chronic intracranial hemorrhage. No mass lesion, midline shift or mass effect. No extra-axial fluid collection. Pituitary gland suprasellar region normal. Midline structures intact. Vascular: Major intracranial vascular flow voids are maintained. Skull and upper cervical spine: Craniocervical junction within normal limits. Bone marrow signal intensity normal. No scalp soft tissue abnormality. Sinuses/Orbits: Globes and orbital soft tissues within normal limits. Mild scattered mucosal thickening noted within the ethmoidal air cells and maxillary sinuses. Paranasal sinuses are otherwise clear. No significant mastoid effusion. Inner ear structures grossly normal. Other: None. IMPRESSION: 1. Moderately large evolving acute to early subacute  hemorrhagic right PCA distribution infarct. No significant mass effect. 2. Trace blood products layering within the occipital horns of both lateral ventricles, likely related to  redistribution of small volume associated subarachnoid blood. No hydrocephalus or trapping. 3. Small remote left thalamic lacunar infarct. Electronically Signed   By: Jeannine Boga M.D.   On: 09/28/2020 00:45     Medical Consultants:   None.   Subjective:    Greg Robertson relates he still having vision loss.  Objective:    Vitals:   09/28/20 0615 09/28/20 0630 09/28/20 0728 09/28/20 0729  BP: 127/77 126/82  130/90  Pulse: 93 95  95  Resp: 18 19  19   Temp:    98.7 F (37.1 C)  TempSrc:    Oral  SpO2: 96% 94%  95%  Weight:   78 kg   Height:   6\' 3"  (1.905 m)    SpO2: 95 % O2 Flow Rate (L/min): 3 L/min   Intake/Output Summary (Last 24 hours) at 09/28/2020 0957 Last data filed at 09/27/2020 2130 Gross per 24 hour  Intake 500 ml  Output --  Net 500 ml   Filed Weights   09/28/20 0728  Weight: 78 kg    Exam: General exam: In no acute distress. Respiratory system: Good air movement and clear to auscultation. Cardiovascular system: S1 & S2 heard, RRR. No JVD, Gastrointestinal system: Abdomen is nondistended, soft and nontender.  Extremities: No pedal edema. Skin: No rashes, lesions or ulcers Psychiatry: Judgement and insight appear normal. Mood & affect appropriate.    Data Reviewed:    Labs: Basic Metabolic Panel: Recent Labs  Lab 09/27/20 1559 09/27/20 2115 09/28/20 0732  NA 132* 134* 134*  K 5.2* 3.9 4.2  CL 103  --  102  CO2 19*  --  24  GLUCOSE 435*  --  123*  BUN 25*  --  19  CREATININE 1.63*  --  1.45*  CALCIUM 9.1  --  8.9   GFR Estimated Creatinine Clearance: 53 mL/min (A) (by C-G formula based on SCr of 1.45 mg/dL (H)). Liver Function Tests: No results for input(s): AST, ALT, ALKPHOS, BILITOT, PROT, ALBUMIN in the last 168 hours. No results for input(s): LIPASE, AMYLASE in the last 168 hours. No results for input(s): AMMONIA in the last 168 hours. Coagulation profile No results for input(s): INR, PROTIME in the last 168  hours. COVID-19 Labs  No results for input(s): DDIMER, FERRITIN, LDH, CRP in the last 72 hours.  Lab Results  Component Value Date   [place] NEGATIVE 09/27/2020    CBC: Recent Labs  Lab 09/27/20 1559 09/27/20 2115  WBC 8.6  --   HGB 14.3 17.0  HCT 43.5 50.0  MCV 92.9  --   PLT 148*  --    Cardiac Enzymes: No results for input(s): CKTOTAL, CKMB, CKMBINDEX, TROPONINI in the last 168 hours. BNP (last 3 results) No results for input(s): PROBNP in the last 8760 hours. CBG: Recent Labs  Lab 09/27/20 1554 09/27/20 2042 09/27/20 2258 09/28/20 0823  GLUCAP 429* 297* 245* 171*   D-Dimer: No results for input(s): DDIMER in the last 72 hours. Hgb A1c: No results for input(s): HGBA1C in the last 72 hours. Lipid Profile: Recent Labs    09/28/20 0732  CHOL 201*  HDL 62  LDLCALC 128*  TRIG 55  CHOLHDL 3.2   Thyroid function studies: No results for input(s): TSH, T4TOTAL, T3FREE, THYROIDAB in the last 72 hours.  Invalid input(s): FREET3 Anemia work up:  No results for input(s): VITAMINB12, FOLATE, FERRITIN, TIBC, IRON, RETICCTPCT in the last 72 hours. Sepsis Labs: Recent Labs  Lab 09/27/20 1559  WBC 8.6   Microbiology Recent Results (from the past 240 hour(s))  Resp Panel by RT-PCR (Flu A&B, Covid) Nasopharyngeal Swab     Status: None   Collection Time: 09/27/20  9:05 PM   Specimen: Nasopharyngeal Swab; Nasopharyngeal(NP) swabs in vial transport medium  Result Value Ref Range Status   SARS Coronavirus 2 by RT PCR NEGATIVE NEGATIVE Final    Comment: (NOTE) SARS-CoV-2 target nucleic acids are NOT DETECTED.  The SARS-CoV-2 RNA is generally detectable in upper respiratory specimens during the acute phase of infection. The lowest concentration of SARS-CoV-2 viral copies this assay can detect is 138 copies/mL. A negative result does not preclude SARS-Cov-2 infection and should not be used as the sole basis for treatment or other patient management decisions. A  negative result may occur with  improper specimen collection/handling, submission of specimen other than nasopharyngeal swab, presence of viral mutation(s) within the areas targeted by this assay, and inadequate number of viral copies(<138 copies/mL). A negative result must be combined with clinical observations, patient history, and epidemiological information. The expected result is Negative.  Fact Sheet for Patients:  EntrepreneurPulse.com.au  Fact Sheet for Healthcare Providers:  IncredibleEmployment.be  This test is no t yet approved or cleared by the [place] FDA and  has been authorized for detection and/or diagnosis of SARS-CoV-2 by FDA under an Emergency Use Authorization (EUA). This EUA will remain  in effect (meaning this test can be used) for the duration of the COVID-19 declaration under Section 564(b)(1) of the Act, 21 U.S.C.section 360bbb-3(b)(1), unless the authorization is terminated  or revoked sooner.       Influenza A by PCR NEGATIVE NEGATIVE Final   Influenza B by PCR NEGATIVE NEGATIVE Final    Comment: (NOTE) The Xpert Xpress SARS-CoV-2/FLU/RSV plus assay is intended as an aid in the diagnosis of influenza from Nasopharyngeal swab specimens and should not be used as a sole basis for treatment. Nasal washings and aspirates are unacceptable for Xpert Xpress SARS-CoV-2/FLU/RSV testing.  Fact Sheet for Patients: EntrepreneurPulse.com.au  Fact Sheet for Healthcare Providers: IncredibleEmployment.be  This test is not yet approved or cleared by the [place] FDA and has been authorized for detection and/or diagnosis of SARS-CoV-2 by FDA under an Emergency Use Authorization (EUA). This EUA will remain in effect (meaning this test can be used) for the duration of the COVID-19 declaration under Section 564(b)(1) of the Act, 21 U.S.C. section 360bbb-3(b)(1), unless the authorization  is terminated or revoked.  Performed at [facility], [place] [address], [address], [place] [address]      Medications:     stroke: mapping our early stages of recovery book   Does not apply Once   aspirin EC  81 mg Oral Daily   atorvastatin  40 mg Oral Daily   insulin aspart  0-15 Units Subcutaneous TID WC   insulin aspart  4 Units Subcutaneous TID WC   insulin glargine  14 Units Subcutaneous Daily   loratadine  10 mg Oral QPM   multivitamin with minerals  1 tablet Oral [id number]   triamcinolone  2 spray Nasal Daily   Continuous Infusions:    LOS: 1 day   Charlynne Cousins  [facility]  09/28/2020, 9:57 AM

## 2020-09-28 NOTE — Progress Notes (Signed)
Ok to add Lovenox 40mg  qday until NOAC is started for CVA per Dr. Aileen Fass.  Onnie Boer, PharmD, BCIDP, AAHIVP, CPP Infectious Disease Pharmacist 09/28/2020 2:45 PM

## 2020-09-28 NOTE — Evaluation (Signed)
Physical Therapy Evaluation Patient Details Name: Greg Robertson MRN: [id number] DOB: 1951/01/12 Today's Date: 09/28/2020   History of Present Illness  Pt is 70 yo male who had onset of visual changes 1 day prior to admission. CT showed R occipital CVA. PMH: Afib, poorly controlled DM1. former smoker, HTN, R eye deficits  Clinical Impression  Patient evaluated by Physical Therapy with no further acute PT needs identified. All education has been completed and the patient has no further questions. Pt presents with L homonymous hemianopsia on top of previous visual deficits R eye. However, he is independent with mobility and did not demonstrate balance deficits. Pt did well scanning visual field and avoided all obstacles. He will need extra community support since he lives alone and will not be able to drive, recommend CSW consult.  See below for any follow-up Physical Therapy or equipment needs. PT is signing off. Thank you for this referral.     Follow Up Recommendations No PT follow up    Equipment Recommendations  None recommended by PT    Recommendations for Other Services OT consult     Precautions / Restrictions Precautions Precautions: Fall Restrictions Weight Bearing Restrictions: No      Mobility  Bed Mobility Overal bed mobility: Independent                  Transfers Overall transfer level: Independent                  Ambulation/Gait Ambulation/Gait assistance: Modified independent (Device/Increase time) Gait Distance (Feet): 500 Feet Assistive device: None Gait Pattern/deviations: WFL(Within Functional Limits) Gait velocity: WFL Gait velocity interpretation: >4.37 ft/sec, indicative of normal walking speed General Gait Details: pt began ambulation with mild guarding but improved with distance. Did not hit objects L side and did a good job scanning environment. He did have trouble finding his room number due to visual deficits with reading  signs  Stairs            Wheelchair Mobility    Modified Rankin (Stroke Patients Only) Modified Rankin (Stroke Patients Only) Pre-Morbid Rankin Score: No symptoms Modified Rankin: Slight disability     Balance Overall balance assessment: Modified Independent                                           Pertinent Vitals/Pain Pain Assessment: 0-10 Pain Score: 2  Pain Location: R forehead and posterior head Pain Descriptors / Indicators: Headache Pain Intervention(s): Limited activity within patient's tolerance;Monitored during session    Home Living Family/patient expects to be discharged to:: Private residence Living Arrangements: Alone   Type of Home: House Home Access: Level entry     Home Layout: Two level;Able to live on main level with bedroom/bathroom Home Equipment: Shower seat      Prior Function Level of Independence: Independent         Comments: drives, has store and pharmacy within walking distance, not great family support     Hand Dominance   Dominant Hand: Right    Extremity/Trunk Assessment   Upper Extremity Assessment Upper Extremity Assessment: Defer to OT evaluation    Lower Extremity Assessment Lower Extremity Assessment: Overall WFL for tasks assessed    Cervical / Trunk Assessment Cervical / Trunk Assessment: Kyphotic  Communication   Communication: No difficulties  Cognition Arousal/Alertness: Awake/alert Behavior During Therapy: WFL for tasks assessed/performed Overall  Cognitive Status: Within Functional Limits for tasks assessed                                        General Comments General comments (skin integrity, edema, etc.): pt relays that he has had R eye decreased vision for years, lacks depth perception due to this. Now with L homonymous hemianopsia. Discussed strategies. Could use extra community support since he will be unable to drive    Exercises     Assessment/Plan     PT Assessment Patent does not need any further PT services  PT Problem List         PT Treatment Interventions      PT Goals (Current goals can be found in the Care Plan section)  Acute Rehab PT Goals Patient Stated Goal: return home PT Goal Formulation: All assessment and education complete, DC therapy    Frequency     Barriers to discharge        Co-evaluation               AM-PAC PT "6 Clicks" Mobility  Outcome Measure Help needed turning from your back to your side while in a flat bed without using bedrails?: None Help needed moving from lying on your back to sitting on the side of a flat bed without using bedrails?: None Help needed moving to and from a bed to a chair (including a wheelchair)?: None Help needed standing up from a chair using your arms (e.g., wheelchair or bedside chair)?: None Help needed to walk in hospital room?: None Help needed climbing 3-5 steps with a railing? : None 6 Click Score: 24    End of Session   Activity Tolerance: Patient tolerated treatment well Patient left: in bed;with call bell/phone within reach Nurse Communication: Mobility status (mod I) PT Visit Diagnosis: Unsteadiness on feet (R26.81)    Time: 1520-1550 PT Time Calculation (min) (ACUTE ONLY): 30 min   Charges:   PT Evaluation $PT Eval Moderate Complexity: 1 Mod PT Treatments $Gait Training: 8-22 mins        [place]  Pager [phone] Office [place] 09/28/2020, 4:34 PM

## 2020-09-28 NOTE — Progress Notes (Signed)
Patient arrived to [id number]-[id number]

## 2020-09-28 NOTE — ED Notes (Signed)
Patient transferred to a hospital bed. Patient is A/O x4 and is comfortable.

## 2020-09-28 NOTE — ED Notes (Signed)
Tele  Breakfast Ordered 

## 2020-09-28 NOTE — Progress Notes (Addendum)
STROKE TEAM PROGRESS NOTE    Interval History   No acute events overnight, patient educated about stroke, stroke risk factors and updated about his echo that showed reduced EF at 30-35 %.   Pertinent Lab Work and Imaging    09/28/20 CT Head WO IV Contrast 1. Right occipital lobe infarct consistent with the patient's symptoms. The infarct exerts mild mass effect on the right side of the quadrigeminal plate cistern and significant mass effect on the occipital horn of the right lateral ventricle. There is no midline shift however. There is likely punctate hemorrhage scattered throughout the infarct. No large hematoma. 2. No other abnormalities.  09/27/20 CT Angio Head and Neck W WO IV Contrast There is no proximal intracranial vessel occlusion; specifically no proximal right PCA occlusion. Irregularity more distally with possible proximal P4 branch occlusion.   No large vessel occlusion in the neck. Mild plaque at the left vertebral origin. Plaque at the ICA origins causes less than 50% stenosis.   Bilateral pleural effusions.  09/28/20 MRI Brain WO IV Contrast 1. Moderately large evolving acute to early subacute hemorrhagic right PCA distribution infarct. No significant mass effect. 2. Trace blood products layering within the occipital horns of both lateral ventricles, likely related to redistribution of small volume associated subarachnoid blood. No hydrocephalus or trapping. 3. Small remote left thalamic lacunar infarct.   09/28/20 Echocardiogram Complete   1. Left ventricular ejection fraction, by estimation, is 30 to 35%. The left ventricle has moderately decreased function. The left ventricle demonstrates global hypokinesis. Left ventricular diastolic parameters are consistent with Grade II diastolic dysfunction (pseudonormalization). Elevated left atrial pressure.   2. LV apex not well visualized. In setting of CVA with LV systolic dysfunction, would consider repeat limited echo with  contrast to rule out apical thrombus   3. Right ventricular systolic function is normal. The right ventricular size is normal. Tricuspid regurgitation signal is inadequate for assessing PA pressure.   4. The mitral valve is normal in structure. No evidence of mitral valve regurgitation. No evidence of mitral stenosis.   5. The aortic valve was not well visualized. Aortic valve regurgitation is not visualized. No aortic stenosis is present.   6. The inferior vena cava is normal in size with greater than 50% respiratory variability, suggesting right atrial pressure of 3 mmHg.   Physical Examination   Constitutional: Calm, appropriate for condition  Cardiovascular: Normal RR Respiratory: No increased WOB   Mental status: AAOx4, following commands  Speech: Fluent with repetition and naming intact  Cranial nerves: EOMI, L HH, Face symmetric, Tongue midline, Shoulder shrug intact  Motor: Normal bulk and tone. No drift. 5/5 strength throughout  Sensory: Intact to light tough throughout  Coordination: Intact FNF + HTS  Gait: Deferred   NIHSS: 2 for L HH   Assessment and Plan   Mr. QUY LOTTS is a 70 y.o. 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   #R PCA Stroke  Patient presented with the symptoms described above. At this time, stroke work up is complete. MRI Brain revealed a R PCA stroke with extensive hemorrhagic conversion. Echo was pertinent for EF with reduced EF of 30 to 35 %, LV with global hypokinesis, LA normal in size. CTA Head and Neck with possible right P4 proximal occlusion, no significant stenosis to the posterior circulation. Stroke labs w/LDL 128, Hemoglobin  A1C pending. Stroke etiology is cardioembolic likely due to atrial fibrillation while not on anticoagulation versus reduced EF in the setting of heart failure. Discussed anticoagulation with  him and he is willing to try Xarelto. States that he had boils within the nose when he tried Eliquis in the past. Given significant hemorrhagic conversion of stroke will defer on starting [facility] at this time and recommend to start in 10-14 days with repeat imaging done prior to starting - CTH in 10 days on 10/08/20 to evaluate stability of HT, if stable can start [facility] with Xarelto  - In the interim, before starting [facility], please continue Aspirin 81 mg for stroke prevention purposes and stop this when [employer] is started  - Continue Atorvastatin 40 mg for secondary stroke prevention -At discharge please place ambulatory referral to neurology for stroke follow up   #Hypertension He has has a history of HTN and takes Lisinopril 10 mg QD at home. Currently blood pressure is trending in the 110-120 range. Recommend permissive hypertension 48 hours post stroke and from there, gradually reduce the blood pressure, avoiding any acute drops. Long term blood pressure goal is < 140/90.  #Hyperlipidemia From a stroke prevention stand point, the LDL goal is < 70. His LDL is 128. He was taking Simvastatin at home however given LDL is at goal Atorvastatin 40 was initiated this admission. Continue this at discharge and stop home Simvastatin.   #Diabetes Type II  He has a history of diabetes and Hemoglobin A1C this admission is currently pending.   Hospital day # 1  Ruta Hinds, NP  [facility] Nurse Practitioner Patient seen and discussed with attending physician Dr. Erlinda Hong   ATTENDING NOTE: I reviewed above note and agree with the assessment and plan. Pt was seen and examined.   70 year old male with history of A. fib off Eliquis due to side effect, uncontrolled diabetes, right CRAO, HTN admitted for left hemianopia.  CT showed right PCA stroke with mass-effect.  CTA head and neck showed possible right P4 occlusion.  MRI confirmed right large PCA infarct with confluent petechial hemorrhage.  EF 30 to 35%, LDL 128,  A1c pending.  Creatinine 1.48-> 1.63-> 1.45.  Patient stated that he was found to have A. fib in the past in the setting of fall with fracture and sepsis.  He was put on Eliquis later but developed nose boils temporarily associated with Eliquis use.  He stopped Eliquis due to side effect.  He was not on any antithrombotics PTA. Patient also had a history of right CRAO with residual right central vision loss.  On exam, no family at bedside, patient sitting at edge of the bed for dinner, awake alert, orientated x3.  No aphasia, follows simple commands.  Able to name and repeat.  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  Otherwise, neuro intact.  Etiology for patient stroke likely due to A. fib not on AC.  Currently on aspirin 81, given large size of stroke with significant petechial hemorrhage, no antithrombotics for now.  However, recommend to repeat CT head in 10-14 days, if no frank hematoma, anticoagulation may be initiated.  Given prior side effect of Eliquis, will consider Pradaxa at that time given different working mechanism to minimize medication side effect.  Continue Lipitor 40.  Will follow.  For detailed assessment and plan, please refer to above as I have made changes wherever appropriate.   Rosalin Hawking, MD PhD Stroke Neurology 09/28/2020 6:47 PM  I spent  35 minutes in total face-to-face time with the patient, more than 50% of which was spent in counseling and coordination of care, reviewing test results, images and medication, and discussing the diagnosis, treatment plan and potential prognosis. This patient's care requiresreview of multiple databases, neurological assessment, discussion with family, other specialists and medical decision making of high complexity.     To contact Stroke Continuity provider, please refer to [email or web address] After hours, contact General Neurology

## 2020-09-29 ENCOUNTER — Inpatient Hospital Stay (HOSPITAL_COMMUNITY): Payer: Medicare Other

## 2020-09-29 LAB — BRAIN NATRIURETIC PEPTIDE: B Natriuretic Peptide: 1317.8 pg/mL — ABNORMAL HIGH (ref 0.0–100.0)

## 2020-09-29 LAB — BASIC METABOLIC PANEL
Anion gap: 9 (ref 5–15)
BUN: 18 mg/dL (ref 8–23)
CO2: 20 mmol/L — ABNORMAL LOW (ref 22–32)
Calcium: 8.6 mg/dL — ABNORMAL LOW (ref 8.9–10.3)
Chloride: 102 mmol/L (ref 98–111)
Creatinine, Ser: 1.34 mg/dL — ABNORMAL HIGH (ref 0.61–1.24)
GFR, Estimated: 57 mL/min — ABNORMAL LOW (ref >60)
Glucose, Bld: 251 mg/dL — ABNORMAL HIGH (ref 70–99)
Potassium: 4.2 mmol/L (ref 3.5–5.1)
Sodium: 131 mmol/L — ABNORMAL LOW (ref 135–145)

## 2020-09-29 LAB — GLUCOSE, CAPILLARY
Glucose-Capillary: 227 mg/dL — ABNORMAL HIGH (ref 70–99)
Glucose-Capillary: 271 mg/dL — ABNORMAL HIGH (ref 70–99)
Glucose-Capillary: 90 mg/dL (ref 70–99)
Glucose-Capillary: 141 mg/dL — ABNORMAL HIGH (ref 70–99)
Glucose-Capillary: 103 mg/dL — ABNORMAL HIGH (ref 70–99)

## 2020-09-29 IMAGING — DX DG CHEST 2V
2 series · 2 of 2 positions shown · non-contrast
Comparison: [DATE]

CLINICAL DATA: Cough, weakness, dyspnea, code stroke

EXAM:
CHEST - 2 VIEW

[w chest pa]
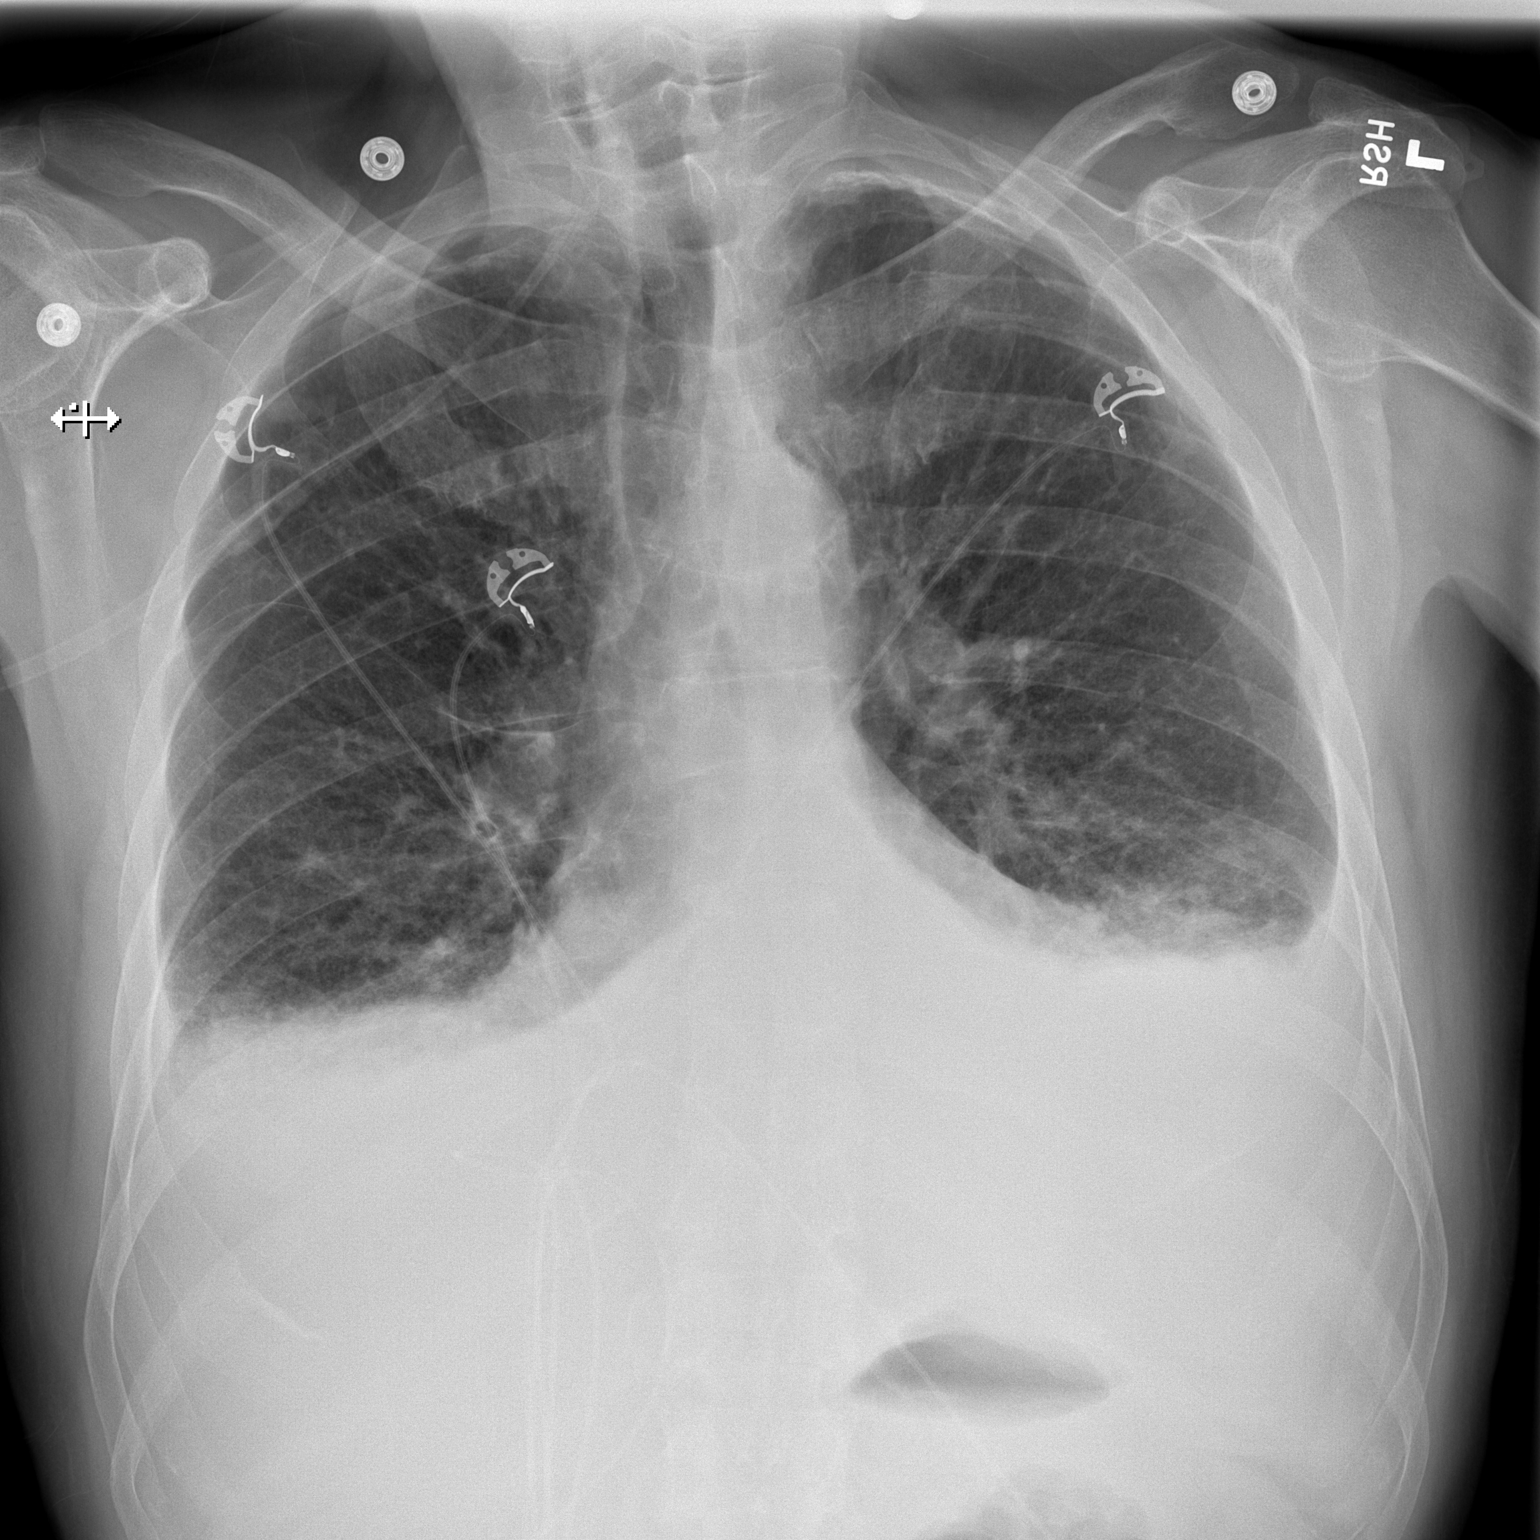

[w chest lat]
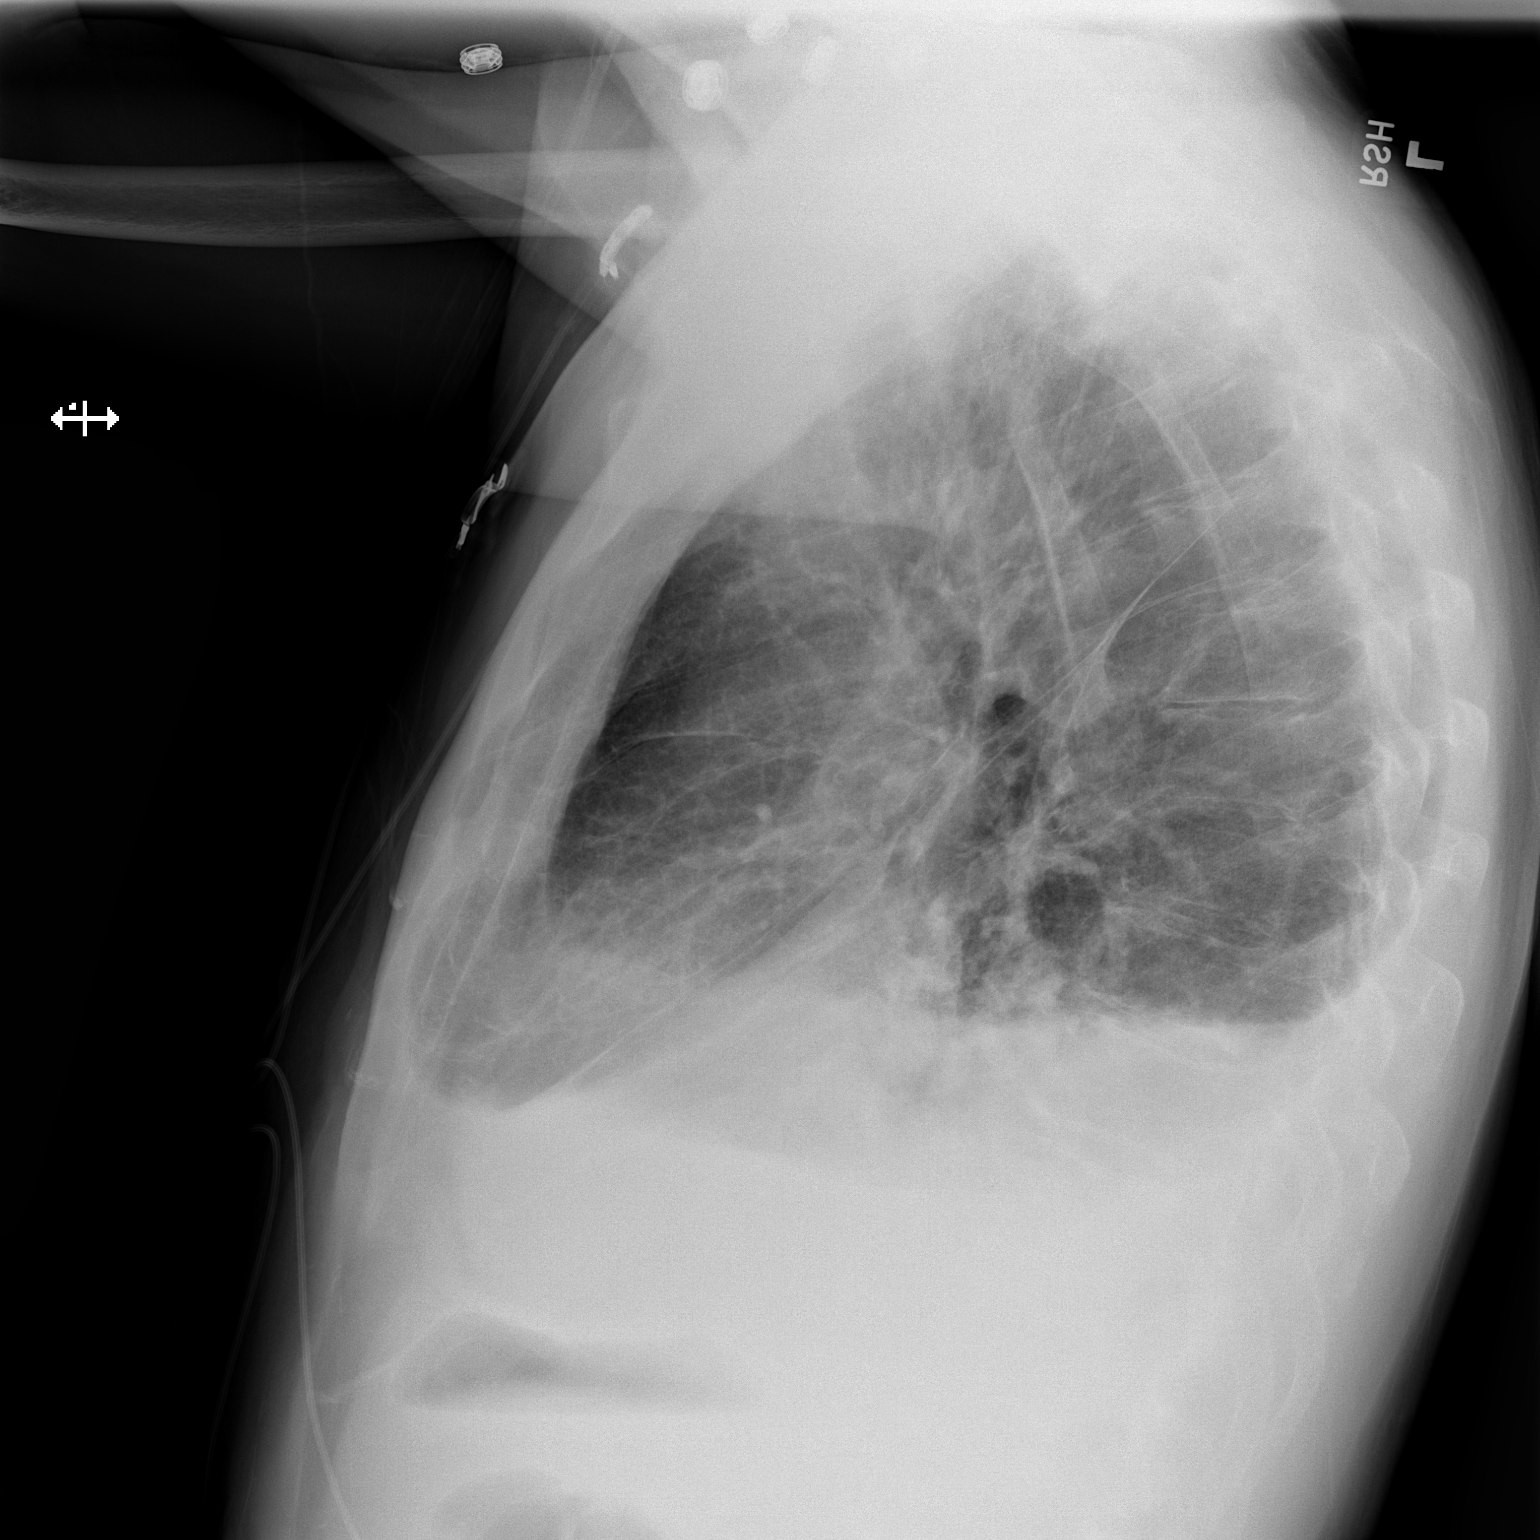

[2 of 2 positions shown; findings below may reference images not displayed]

FINDINGS: Normal heart size mediastinal contours.

Bibasilar pleural effusions and atelectasis similar to prior study.

Upper lungs clear.

LEFT apex scarring stable.

No new infiltrate or pneumothorax.

Bones demineralized.
IMPRESSION: Persistent bibasilar pleural effusions and atelectasis slightly
greater on LEFT, unchanged.

## 2020-09-29 MED ORDER — TRAMADOL HCL 50 MG PO TABS
50.0000 mg | ORAL_TABLET | Freq: Four times a day (QID) | ORAL | Status: DC | PRN
  Administered 2020-09-29 – 2020-09-30 (×3): 50 mg via ORAL
  Filled 2020-09-29 (×3): qty 1

## 2020-09-29 MED ORDER — INSULIN ASPART 100 UNIT/ML IJ SOLN
0.0000 [IU] | Freq: Three times a day (TID) | INTRAMUSCULAR | Status: DC
  Administered 2020-09-29: 2 [IU] via SUBCUTANEOUS
  Administered 2020-09-30: 8 [IU] via SUBCUTANEOUS
  Administered 2020-10-01: 3 [IU] via SUBCUTANEOUS

## 2020-09-29 MED ORDER — INSULIN ASPART 100 UNIT/ML IJ SOLN: 0.0000 [IU] | Freq: Every day | INTRAMUSCULAR | Status: DC

## 2020-09-29 MED ORDER — INSULIN ASPART 100 UNIT/ML IJ SOLN
4.0000 [IU] | Freq: Three times a day (TID) | INTRAMUSCULAR | Status: DC
  Administered 2020-09-29 – 2020-10-01 (×5): 4 [IU] via SUBCUTANEOUS

## 2020-09-29 MED ORDER — MELATONIN 3 MG PO TABS
3.0000 mg | ORAL_TABLET | Freq: Every day | ORAL | Status: DC
  Administered 2020-09-29 – 2020-09-30 (×2): 3 mg via ORAL
  Filled 2020-09-29 (×2): qty 1

## 2020-09-29 MED ORDER — INSULIN GLARGINE 100 UNIT/ML ~~LOC~~ SOLN
20.0000 [IU] | Freq: Two times a day (BID) | SUBCUTANEOUS | Status: DC
  Filled 2020-09-29: qty 0.2

## 2020-09-29 MED ORDER — INSULIN GLARGINE 100 UNIT/ML ~~LOC~~ SOLN
15.0000 [IU] | Freq: Two times a day (BID) | SUBCUTANEOUS | Status: DC
  Administered 2020-09-29 – 2020-10-01 (×4): 15 [IU] via SUBCUTANEOUS
  Filled 2020-09-29 (×5): qty 0.15

## 2020-09-29 NOTE — Progress Notes (Signed)
Occupational Therapy Treatment Patient Details Name: Greg Robertson MRN: [id number] DOB: 11-20-1950 Today's Date: 09/29/2020    History of present illness Pt is 70 yo male who had onset of visual changes 1 day prior to admission. CT showed R occipital CVA. PMH: Afib, poorly controlled DM1. former smoker, HTN, R eye deficits   OT comments  Focus on visual strategies, patient given laminated visual aide - where there is a blue line. Pt puts side of page and aide in alignment and every time he scans to the left for the blue line until he finds it to make sure that he is reading the whole thing. OT also provided Pt with magnifying glass to enhance vision when testing sugars (one of the things Pt is nervous about at home). Pt very appreciative and will need continued reinforcement/education in further strategies.   Follow Up Recommendations  Outpatient OT (neuro specific for VISION)    Equipment Recommendations  None recommended by OT    Recommendations for Other Services      Precautions / Restrictions Precautions Precautions: Fall Precaution Comments: L homonymous hemianopsia on top of previous visual deficits R eye       Mobility Bed Mobility Overal bed mobility: Independent                  Transfers Overall transfer level: Independent                    Balance Overall balance assessment: Modified Independent                                         ADL either performed or assessed with clinical judgement   ADL                                         General ADL Comments: focus on visual compensatory strategies     Vision   Additional Comments: Pt provided with 2 tools - one is a bright blue laminated strip that is to be used during reading to assist with him finding the left side of the page, the other is a magnifying glass to assist with targeting his blood and reading his glucometer during sugar testing    Perception     Praxis      Cognition Arousal/Alertness: Awake/alert Behavior During Therapy: WFL for tasks assessed/performed Overall Cognitive Status: Within Functional Limits for tasks assessed                                          Exercises Exercises: Other exercises Other Exercises Other Exercises: visual scanning strategies   Shoulder Instructions       General Comments Pt on 2 L O2 throughout session seated in chair    Pertinent Vitals/ Pain       Pain Assessment: No/denies pain  Home Living                                          Prior Functioning/Environment              Frequency  Min 3X/week        Progress Toward Goals  OT Goals(current goals can now be found in the care plan section)  Progress towards OT goals: Progressing toward goals  Acute Rehab OT Goals Patient Stated Goal: return home, learn strategies to assist with vision OT Goal Formulation: With patient Time For Goal Achievement: 10/13/20 Potential to Achieve Goals: Good ADL Goals Pt Will Perform Grooming: Independently;standing Additional ADL Goal #1: Pt will recall and utilize visual strategies to assit with reading with no verbal cues Additional ADL Goal #2: Pt with implement visual compensatory strategies with no cues to find objects on L side when performing ADL  Plan Discharge plan remains appropriate    Co-evaluation                 AM-PAC OT "6 Clicks" Daily Activity     Outcome Measure   Help from another person eating meals?: A Little Help from another person taking care of personal grooming?: A Little Help from another person toileting, which includes using toliet, bedpan, or urinal?: None Help from another person bathing (including washing, rinsing, drying)?: None Help from another person to put on and taking off regular upper body clothing?: None Help from another person to put on and taking off regular lower body  clothing?: None 6 Click Score: 22    End of Session    OT Visit Diagnosis: Low vision, both eyes (H54.2)   Activity Tolerance Patient tolerated treatment well   Patient Left in chair   Nurse Communication Mobility status        Time: 3818-2993 OT Time Calculation (min): 13 min  Charges: OT General Charges $OT Visit: 1 Visit OT Treatments $Therapeutic Activity: 8-22 mins  Jesse Sans OTR/L Acute Rehabilitation Services Pager: [phone] Office: [place] 09/29/2020, 2:40 PM

## 2020-09-29 NOTE — Progress Notes (Addendum)
Inpatient Diabetes Program Recommendations  AACE/ADA: New Consensus Statement on Inpatient Glycemic Control (2015)  Target Ranges:  Prepandial:   less than 140 mg/dL      Peak postprandial:   less than 180 mg/dL (1-2 hours)      Critically ill patients:  140 - 180 mg/dL   Lab Results  Component Value Date   GLUCAP 90 09/29/2020   HGBA1C 8.7 (H) 09/28/2020    Review of Glycemic Control Results for Greg Robertson, Greg Robertson" (MRN [id number]) as of 09/29/2020 14:40  Ref. Range 09/28/2020 12:08 09/28/2020 16:26 09/28/2020 21:15 09/29/2020 03:27 09/29/2020 06:33 09/29/2020 12:26  Glucose-Capillary Latest Ref Range: 70 - 99 mg/dL 165 (H) 84 243 (H) 227 (H) 271 (H) 90   Diabetes history: DM 2 Outpatient Diabetes medications:  Novolog 8-15 units tid with meals, Tresiba 14 units q AM Current orders for Inpatient glycemic control:  Lantus 15 units bid, Novolog moderate tid with meals and HS Novolog 4 units tid with meals  Recommendations:   Consider reducing Novolog correction to sensitive tid with meals.   Thanks,  Adah Perl, RN, BC-ADM Inpatient Diabetes Coordinator Pager [phone]  (8a-5p)

## 2020-09-29 NOTE — Progress Notes (Signed)
[facility] PROGRESS NOTE    Progress Note  Greg Robertson  BJS:[id number] DOB: [birth year] DOA: 09/27/2020 PCP: Dettinger, Fransisca Kaufmann, MD     Brief Narrative:   Greg Robertson is an 70 y.o. male past medical history of atrial fibrillation not on anticoagulation, poorly controlled diabetes mellitus type I former smoker who started having 1 day prior to admission of sudden onset left-sided vision which is persistent, in the ED CT of the head showed a right occipital stroke mild mass affect on the cistern and the occipital right lateral horn    Assessment/Plan:   Acute ischemic stroke [facility]): HgbA1c pending 8.7, fasting lipid panel LDL greater than 100 HDL greater than 40 on lipitor MRI of the brain showed subacute hemorrhagic right PCA distribution infarct, there is a small volume blood in the occipital horns, with small remote left thalamic lacunar infarct PT recommended no PT follow-up 2D echo showed an EF of 30 to 35% with global hypokinesia and grade 2 diastolic dysfunction, as the apex was not visualized we will get a 2D echo with contrast to rule out apical thrombus. Start patient on ASA 81mg  daily. BP goal: permissive HTN upto 220/120 mmHg No events on telemetry. He is currently on aspirin neurology has been consulted and due to his stroke.  They recommended that given his large size stroke with significant petechial hemorrhage no anticoagulation for now.  They recommended to repeat the CT scan in 10 to 14 days if no frank blood did not kidney initiate anticoagulation at that time. Given his prior side effect of his Eliquis they would recommend Pradaxa.  DM type 1 (diabetes mellitus, type 1) ([place]) Remains uncontrolled and trending up.  Increase long-acting insulin, continue sliding scale. His A1c was 8.7 poorly controlled.  New Acute on chronic systolic and diastolic heart failure Lowest weight is around 77-78 kg which is what he weighs on admission. 2D echo done on  09/28/2020 showed an EF of 60% grade 2 diastolic heart failure apex was not visualized. We will go ahead and repeat a limited 2D echo with contrast to rule out mural thrombus. Blood pressure is stabilizing, we will not start beta-blocker and Entresto at this time as it might worsen the stroke. He will definitely need an ischemic evaluation as an outpatient, will consult cardiology.  Essential hypertension associated with diabetes ([place]) Holding antihypertensive medications to allow permissive hypertension.  Paroxysmal  A-fib ([facility]) Sinus tachycardia continue aspirin on aspirin no events on telemetry. Not a candidate for anticoagulation at this time due to his subacute hemorrhagic infarct.  AKI (acute kidney injury) ([place]) With a baseline of around 1.1-1.3 on admission 1.6 given IV Lasix. He was given a bolus of normal saline his creatinine has returned to baseline.    DVT prophylaxis: lovenox Family Communication:none Status is: Inpatient  Remains inpatient appropriate because:Hemodynamically unstable  Dispo: The patient is from: Home              Anticipated d/c is to: Home              Patient currently is not medically stable to d/c.   Difficult to place patient No        Code Status:     Code Status Orders  (From admission, onward)           Start     Ordered   09/27/20 2128  Full code  Continuous        09/27/20 2130  Code Status History     Date Active Date Inactive Code Status Order ID Comments User Context   04/06/2013 2323 04/10/2013 2145 Full Code [id number]  Theressa Millard, MD Inpatient   03/10/2012 0340 03/14/2012 1824 Full Code [id number]  Angelica Ran, MD Inpatient         IV Access:   Peripheral IV   Procedures and diagnostic studies:   CT Angio Head W or Wo Contrast  Result Date: 09/27/2020 CLINICAL DATA:  Change in vision EXAM: CT ANGIOGRAPHY HEAD AND NECK TECHNIQUE: Multidetector CT imaging of the head and neck  was performed using the standard protocol during bolus administration of intravenous contrast. Multiplanar CT image reconstructions and MIPs were obtained to evaluate the vascular anatomy. Carotid stenosis measurements (when applicable) are obtained utilizing NASCET criteria, using the distal internal carotid diameter as the denominator. CONTRAST:  63mL OMNIPAQUE IOHEXOL 350 MG/ML SOLN COMPARISON:  None. FINDINGS: CTA NECK Aortic arch: Great vessel origins are patent. Right carotid system: Patent. Noncalcified plaque along the common carotid with less than 50% stenosis. Calcified plaque along the proximal internal carotid with less than 50% stenosis. Left carotid system: Patent. Mixed plaque at the internal carotid origin with less than 50% stenosis. Vertebral arteries: Patent and codominant. Mild plaque at the left vertebral origin. Skeleton: No significant osseous abnormality. Other neck: Unremarkable. Upper chest: Bilateral pleural effusions. Review of the MIP images confirms the above findings CTA HEAD Anterior circulation: Intracranial internal carotid arteries are patent with calcified plaque causing mild stenosis. Anterior and middle cerebral arteries are patent. Posterior circulation: Intracranial vertebral arteries are patent. Basilar artery is patent. Major cerebellar artery origins are patent. Left posterior cerebral artery is patent. No proximal right PCA occlusion. There is some right P3 and P4 irregularity with possible occlusion of a proximal P4 branch noted. Venous sinuses: Patent as allowed by contrast bolus timing. Review of the MIP images confirms the above findings IMPRESSION: There is no proximal intracranial vessel occlusion; specifically no proximal right PCA occlusion. Irregularity more distally with possible proximal P4 branch occlusion. No large vessel occlusion in the neck. Mild plaque at the left vertebral origin. Plaque at the ICA origins causes less than 50% stenosis. Bilateral pleural  effusions. Electronically Signed   By: Macy Mis M.D.   On: 09/27/2020 19:56   DG Chest 2 View  Result Date: 09/27/2020 CLINICAL DATA:  Cough and shortness of breath. EXAM: CHEST - 2 VIEW COMPARISON:  Chest x-ray dated Sep 04, 2017. FINDINGS: The heart size and mediastinal contours are within normal limits. Pulmonary vascular congestion. Small bilateral pleural effusions and bibasilar atelectasis. No pneumothorax. No acute osseous abnormality. IMPRESSION: 1. Pulmonary vascular congestion and small bilateral pleural effusions. Electronically Signed   By: Titus Dubin M.D.   On: 09/27/2020 16:12   CT Head Wo Contrast  Result Date: 09/27/2020 CLINICAL DATA:  Acute stroke suspected. Headache. Left vision changes. EXAM: CT HEAD WITHOUT CONTRAST TECHNIQUE: Contiguous axial images were obtained from the base of the skull through the vertex without intravenous contrast. COMPARISON:  None. FINDINGS: Brain: No subdural, epidural, or subarachnoid hemorrhage. There is an infarct in the right occipital lobe. No other sites of cortical ischemia or infarct identified. The occipital horn of the right lateral ventricle is compressed due to mass effect from the infarct. The ventricles are otherwise normal. No midline shift. Basal cisterns are patent although there is mild narrowing of the quadrigeminal plate cistern on the right due to the infarct. The cerebellum and brainstem  are normal. Vascular: Calcified atherosclerosis is seen in the intracranial carotids. Skull: Normal. Negative for fracture or focal lesion. Sinuses/Orbits: No acute finding. Other: None. IMPRESSION: 1. Right occipital lobe infarct consistent with the patient's symptoms. The infarct exerts mild mass effect on the right side of the quadrigeminal plate cistern and significant mass effect on the occipital horn of the right lateral ventricle. There is no midline shift however. There is likely punctate hemorrhage scattered throughout the infarct. No  large hematoma. 2. No other abnormalities. Electronically Signed   By: Dorise Bullion III M.D   On: 09/27/2020 19:38   CT Angio Neck W and/or Wo Contrast  Result Date: 09/27/2020 CLINICAL DATA:  Change in vision EXAM: CT ANGIOGRAPHY HEAD AND NECK TECHNIQUE: Multidetector CT imaging of the head and neck was performed using the standard protocol during bolus administration of intravenous contrast. Multiplanar CT image reconstructions and MIPs were obtained to evaluate the vascular anatomy. Carotid stenosis measurements (when applicable) are obtained utilizing NASCET criteria, using the distal internal carotid diameter as the denominator. CONTRAST:  14mL OMNIPAQUE IOHEXOL 350 MG/ML SOLN COMPARISON:  None. FINDINGS: CTA NECK Aortic arch: Great vessel origins are patent. Right carotid system: Patent. Noncalcified plaque along the common carotid with less than 50% stenosis. Calcified plaque along the proximal internal carotid with less than 50% stenosis. Left carotid system: Patent. Mixed plaque at the internal carotid origin with less than 50% stenosis. Vertebral arteries: Patent and codominant. Mild plaque at the left vertebral origin. Skeleton: No significant osseous abnormality. Other neck: Unremarkable. Upper chest: Bilateral pleural effusions. Review of the MIP images confirms the above findings CTA HEAD Anterior circulation: Intracranial internal carotid arteries are patent with calcified plaque causing mild stenosis. Anterior and middle cerebral arteries are patent. Posterior circulation: Intracranial vertebral arteries are patent. Basilar artery is patent. Major cerebellar artery origins are patent. Left posterior cerebral artery is patent. No proximal right PCA occlusion. There is some right P3 and P4 irregularity with possible occlusion of a proximal P4 branch noted. Venous sinuses: Patent as allowed by contrast bolus timing. Review of the MIP images confirms the above findings IMPRESSION: There is no  proximal intracranial vessel occlusion; specifically no proximal right PCA occlusion. Irregularity more distally with possible proximal P4 branch occlusion. No large vessel occlusion in the neck. Mild plaque at the left vertebral origin. Plaque at the ICA origins causes less than 50% stenosis. Bilateral pleural effusions. Electronically Signed   By: Macy Mis M.D.   On: 09/27/2020 19:56   MR BRAIN WO CONTRAST  Result Date: 09/28/2020 CLINICAL DATA:  Initial evaluation for acute stroke, visual disturbance. EXAM: MRI HEAD WITHOUT CONTRAST TECHNIQUE: Multiplanar, multiecho pulse sequences of the brain and surrounding structures were obtained without intravenous contrast. COMPARISON:  Prior CTs from earlier the same day. FINDINGS: Brain: Cerebral volume within normal limits for age. Small remote lacunar infarct at the left thalamus. No other significant small vessel disease for age. Moderately large evolving acute to early subacute right PCA distribution infarct involving the right temporal occipital region is seen, corresponding with abnormality on prior CT (series 5, image 70). Extensive susceptibility artifact seen throughout the area of infarction, consistent with associated hemorrhage. No frank intraparenchymal hematoma. Trace blood products seen layering within the occipital horns of both lateral ventricles, likely related to redistribution of small volume associated subarachnoid blood (series 14, image 28). No significant midline shift or regional mass effect. No hydrocephalus or trapping. No other evidence for acute or subacute ischemia. Gray-white matter differentiation  otherwise maintained. No other areas of remote cortical infarction. No other evidence for acute or chronic intracranial hemorrhage. No mass lesion, midline shift or mass effect. No extra-axial fluid collection. Pituitary gland suprasellar region normal. Midline structures intact. Vascular: Major intracranial vascular flow voids are  maintained. Skull and upper cervical spine: Craniocervical junction within normal limits. Bone marrow signal intensity normal. No scalp soft tissue abnormality. Sinuses/Orbits: Globes and orbital soft tissues within normal limits. Mild scattered mucosal thickening noted within the ethmoidal air cells and maxillary sinuses. Paranasal sinuses are otherwise clear. No significant mastoid effusion. Inner ear structures grossly normal. Other: None. IMPRESSION: 1. Moderately large evolving acute to early subacute hemorrhagic right PCA distribution infarct. No significant mass effect. 2. Trace blood products layering within the occipital horns of both lateral ventricles, likely related to redistribution of small volume associated subarachnoid blood. No hydrocephalus or trapping. 3. Small remote left thalamic lacunar infarct. Electronically Signed   By: Jeannine Boga M.D.   On: 09/28/2020 00:45   ECHOCARDIOGRAM COMPLETE  Result Date: 09/28/2020    ECHOCARDIOGRAM REPORT   Patient Name:   Greg Robertson Date of Exam: 09/28/2020 Medical Rec #:  [id number]          Height:       75.0 in Accession #:    [id number]         Weight:       172.0 lb Date of Birth:  Mar 15, 1951          BSA:          2.058 m Patient Age:    77 years           BP:           126/82 mmHg Patient Gender: M                  HR:           95 bpm. Exam Location:  Inpatient Procedure: 2D Echo, Cardiac Doppler and Color Doppler Indications:    Stroke  History:        Patient has prior history of Echocardiogram examinations, most                 recent 03/11/2012. Risk Factors:Former Smoker and Diabetes.  Sonographer:    Cammy Brochure Referring Phys: [place]  1. Left ventricular ejection fraction, by estimation, is 30 to 35%. The left ventricle has moderately decreased function. The left ventricle demonstrates global hypokinesis. Left ventricular diastolic parameters are consistent with Grade II diastolic dysfunction  (pseudonormalization). Elevated left atrial pressure.  2. LV apex not well visualized. In setting of CVA with LV systolic dysfunction, would consider repeat limited echo with contrast to rule out apical thrombus  3. Right ventricular systolic function is normal. The right ventricular size is normal. Tricuspid regurgitation signal is inadequate for assessing PA pressure.  4. The mitral valve is normal in structure. No evidence of mitral valve regurgitation. No evidence of mitral stenosis.  5. The aortic valve was not well visualized. Aortic valve regurgitation is not visualized. No aortic stenosis is present.  6. The inferior vena cava is normal in size with greater than 50% respiratory variability, suggesting right atrial pressure of 3 mmHg. FINDINGS  Left Ventricle: Left ventricular ejection fraction, by estimation, is 30 to 35%. The left ventricle has moderately decreased function. The left ventricle demonstrates global hypokinesis. The left ventricular internal cavity size was normal in size. There is no left  ventricular hypertrophy. Left ventricular diastolic parameters are consistent with Grade II diastolic dysfunction (pseudonormalization). Elevated left atrial pressure. Right Ventricle: The right ventricular size is normal. No increase in right ventricular wall thickness. Right ventricular systolic function is normal. Tricuspid regurgitation signal is inadequate for assessing PA pressure. Left Atrium: Left atrial size was normal in size. Right Atrium: Right atrial size was normal in size. Pericardium: There is no evidence of pericardial effusion. Mitral Valve: The mitral valve is normal in structure. No evidence of mitral valve regurgitation. No evidence of mitral valve stenosis. Tricuspid Valve: The tricuspid valve is normal in structure. Tricuspid valve regurgitation is not demonstrated. Aortic Valve: The aortic valve was not well visualized. Aortic valve regurgitation is not visualized. No aortic stenosis  is present. Aortic valve mean gradient measures 2.5 mmHg. Aortic valve peak gradient measures 4.0 mmHg. Aortic valve area, by VTI measures 2.10 cm. Pulmonic Valve: The pulmonic valve was not well visualized. Pulmonic valve regurgitation is not visualized. Aorta: The aortic root and ascending aorta are structurally normal, with no evidence of dilitation. Venous: The inferior vena cava is normal in size with greater than 50% respiratory variability, suggesting right atrial pressure of 3 mmHg. IAS/Shunts: The interatrial septum was not well visualized. Additional Comments: There is a small pleural effusion in the left lateral region.  LEFT VENTRICLE PLAX 2D LVIDd:         5.30 cm  Diastology LVIDs:         3.80 cm  LV e' medial:    5.98 cm/s LV PW:         1.20 cm  LV E/e' medial:  16.3 LV IVS:        0.90 cm  LV e' lateral:   8.38 cm/s LVOT diam:     2.15 cm  LV E/e' lateral: 11.6 LV SV:         39 LV SV Index:   19 LVOT Area:     3.63 cm  RIGHT VENTRICLE             IVC RV Basal diam:  3.60 cm     IVC diam: 1.60 cm RV S prime:     11.10 cm/s TAPSE (M-mode): 1.8 cm LEFT ATRIUM             Index       RIGHT ATRIUM           Index LA diam:        3.60 cm 1.75 cm/m  RA Area:     15.10 cm LA Vol (A2C):   60.1 ml 29.20 ml/m RA Volume:   46.00 ml  22.35 ml/m LA Vol (A4C):   56.6 ml 27.50 ml/m LA Biplane Vol: 59.3 ml 28.81 ml/m  AORTIC VALVE AV Area (Vmax):    2.28 cm AV Area (Vmean):   2.01 cm AV Area (VTI):     2.10 cm AV Vmax:           100.45 cm/s AV Vmean:          75.450 cm/s AV VTI:            0.186 m AV Peak Grad:      4.0 mmHg AV Mean Grad:      2.5 mmHg LVOT Vmax:         63.10 cm/s LVOT Vmean:        41.800 cm/s LVOT VTI:          0.108 m LVOT/AV VTI ratio:  0.54  AORTA Ao Root diam: 3.05 cm Ao Asc diam:  2.65 cm MITRAL VALVE MV Area (PHT): 7.44 cm    SHUNTS MV Decel Time: 102 msec    Systemic VTI:  0.11 m MV E velocity: 97.30 cm/s  Systemic Diam: 2.15 cm MV A velocity: 42.80 cm/s MV E/A ratio:  2.27  Oswaldo Milian MD Electronically signed by Oswaldo Milian MD Signature Date/Time: 09/28/2020/11:17:35 AM    Final      Medical Consultants:   None.   Subjective:    Greg Robertson relates his vision is improving having headaches.  Objective:    Vitals:   09/28/20 2004 09/28/20 2334 09/29/20 0330 09/29/20 0728  BP: 136/84 138/90 132/80 127/84  Pulse: (!) 104 (!) 103 (!) 104 98  Resp: 18 18 18 16   Temp: 99 F (37.2 C) 98.4 F (36.9 C) 98.6 F (37 C) 98.2 F (36.8 C)  TempSrc: Oral Oral Oral Oral  SpO2: 92% 95% 94% 96%  Weight:      Height:       SpO2: 96 % O2 Flow Rate (L/min): 3 L/min   Intake/Output Summary (Last 24 hours) at 09/29/2020 0938 Last data filed at 09/28/2020 2240 Gross per 24 hour  Intake 490 ml  Output 350 ml  Net 140 ml    Filed Weights   09/28/20 0728  Weight: 78 kg    Exam: General exam: In no acute distress. Respiratory system: Good air movement and clear to auscultation. Cardiovascular system: S1 & S2 heard, RRR. No JVD, negative hepatojugular reflux Gastrointestinal system: Abdomen is nondistended, soft and nontender.  Extremities: No pedal edema. Skin: No rashes, lesions or ulcers Psychiatry: Judgement and insight appear normal. Mood & affect appropriate.   Data Reviewed:    Labs: Basic Metabolic Panel: Recent Labs  Lab 09/27/20 1559 09/27/20 2115 09/28/20 0732 09/29/20 0546  NA 132* 134* 134* 131*  K 5.2* 3.9 4.2 4.2  CL 103  --  102 102  CO2 19*  --  24 20*  GLUCOSE 435*  --  123* 251*  BUN 25*  --  19 18  CREATININE 1.63*  --  1.45* 1.34*  CALCIUM 9.1  --  8.9 8.6*    GFR Estimated Creatinine Clearance: 57.4 mL/min (A) (by C-G formula based on SCr of 1.34 mg/dL (H)). Liver Function Tests: No results for input(s): AST, ALT, ALKPHOS, BILITOT, PROT, ALBUMIN in the last 168 hours. No results for input(s): LIPASE, AMYLASE in the last 168 hours. No results for input(s): AMMONIA in the last 168  hours. Coagulation profile No results for input(s): INR, PROTIME in the last 168 hours. COVID-19 Labs  No results for input(s): DDIMER, FERRITIN, LDH, CRP in the last 72 hours.  Lab Results  Component Value Date   [place] NEGATIVE 09/27/2020    CBC: Recent Labs  Lab 09/27/20 1559 09/27/20 2115  WBC 8.6  --   HGB 14.3 17.0  HCT 43.5 50.0  MCV 92.9  --   PLT 148*  --     Cardiac Enzymes: No results for input(s): CKTOTAL, CKMB, CKMBINDEX, TROPONINI in the last 168 hours. BNP (last 3 results) No results for input(s): PROBNP in the last 8760 hours. CBG: Recent Labs  Lab 09/28/20 1208 09/28/20 1626 09/28/20 2115 09/29/20 0327 09/29/20 0633  GLUCAP 165* 84 243* 227* 271*    D-Dimer: No results for input(s): DDIMER in the last 72 hours. Hgb A1c: Recent Labs    09/28/20 0732  HGBA1C 8.7*  Lipid Profile: Recent Labs    09/28/20 0732  CHOL 201*  HDL 62  LDLCALC 128*  TRIG 55  CHOLHDL 3.2    Thyroid function studies: No results for input(s): TSH, T4TOTAL, T3FREE, THYROIDAB in the last 72 hours.  Invalid input(s): FREET3 Anemia work up: No results for input(s): VITAMINB12, FOLATE, FERRITIN, TIBC, IRON, RETICCTPCT in the last 72 hours. Sepsis Labs: Recent Labs  Lab 09/27/20 1559  WBC 8.6    Microbiology Recent Results (from the past 240 hour(s))  Resp Panel by RT-PCR (Flu A&B, Covid) Nasopharyngeal Swab     Status: None   Collection Time: 09/27/20  9:05 PM   Specimen: Nasopharyngeal Swab; Nasopharyngeal(NP) swabs in vial transport medium  Result Value Ref Range Status   SARS Coronavirus 2 by RT PCR NEGATIVE NEGATIVE Final    Comment: (NOTE) SARS-CoV-2 target nucleic acids are NOT DETECTED.  The SARS-CoV-2 RNA is generally detectable in upper respiratory specimens during the acute phase of infection. The lowest concentration of SARS-CoV-2 viral copies this assay can detect is 138 copies/mL. A negative result does not preclude  SARS-Cov-2 infection and should not be used as the sole basis for treatment or other patient management decisions. A negative result may occur with  improper specimen collection/handling, submission of specimen other than nasopharyngeal swab, presence of viral mutation(s) within the areas targeted by this assay, and inadequate number of viral copies(<138 copies/mL). A negative result must be combined with clinical observations, patient history, and epidemiological information. The expected result is Negative.  Fact Sheet for Patients:  EntrepreneurPulse.com.au  Fact Sheet for Healthcare Providers:  IncredibleEmployment.be  This test is no t yet approved or cleared by the [place] FDA and  has been authorized for detection and/or diagnosis of SARS-CoV-2 by FDA under an Emergency Use Authorization (EUA). This EUA will remain  in effect (meaning this test can be used) for the duration of the COVID-19 declaration under Section 564(b)(1) of the Act, 21 U.S.C.section 360bbb-3(b)(1), unless the authorization is terminated  or revoked sooner.       Influenza A by PCR NEGATIVE NEGATIVE Final   Influenza B by PCR NEGATIVE NEGATIVE Final    Comment: (NOTE) The Xpert Xpress SARS-CoV-2/FLU/RSV plus assay is intended as an aid in the diagnosis of influenza from Nasopharyngeal swab specimens and should not be used as a sole basis for treatment. Nasal washings and aspirates are unacceptable for Xpert Xpress SARS-CoV-2/FLU/RSV testing.  Fact Sheet for Patients: EntrepreneurPulse.com.au  Fact Sheet for Healthcare Providers: IncredibleEmployment.be  This test is not yet approved or cleared by the [place] FDA and has been authorized for detection and/or diagnosis of SARS-CoV-2 by FDA under an Emergency Use Authorization (EUA). This EUA will remain in effect (meaning this test can be used) for the duration of  the COVID-19 declaration under Section 564(b)(1) of the Act, 21 U.S.C. section 360bbb-3(b)(1), unless the authorization is terminated or revoked.  Performed at [facility], [place] [address], [place], [place] [address]      Medications:     stroke: mapping our early stages of recovery book   Does not apply Once   aspirin EC  81 mg Oral Daily   atorvastatin  40 mg Oral Daily   enoxaparin (LOVENOX) injection  40 mg Subcutaneous Q24H   insulin aspart  0-15 Units Subcutaneous TID WC   insulin aspart  4 Units Subcutaneous TID WC   insulin glargine  10 Units Subcutaneous BID   loratadine  10 mg Oral  QPM   melatonin  3 mg Oral QHS   multivitamin with minerals  1 tablet Oral [id number]   triamcinolone  2 spray Nasal Daily   Continuous Infusions:    LOS: 2 days   Charlynne Cousins  [facility]  09/29/2020, 9:38 AM

## 2020-09-29 NOTE — Consult Note (Addendum)
Cardiology Consultation:   Patient ID: ALIOUNE HODGKIN MRN: [id number]; DOB: [birth year]  Admit date: 09/27/2020 Date of Consult: 09/29/2020  PCP:  Dettinger, Fransisca Kaufmann, MD   [facility] Providers Cardiologist:  Dr Percival Spanish last seen 2019  Patient Profile:   Greg Robertson is a 70 y.o. male with a hx of paroxysmal atrial fibrillation not on [facility], aortic atherosclerosis, hypertension, hyperlipidemia, type 1 diabetes, CVA, history of right central retinal artery occlusion, colon cancer s/p right colectomy 06/02/2011 and adjuvant FOLFOX chemotherapy 01/05/12 who is currently being admitted for acute right PCA CVA.  Cardiology is consulted on 09/29/2020 due to abnormal echocardiogram at the request of Dr. Aileen Fass.   History of Present Illness:   Mr. Zanetti follows Dr. Percival Spanish outpatient, last seen on 02/28/2018.   He was initially diagnosed with paroxysmal atrial fibrillation with RVR on 03/11/2012 during hospitalization for DKA.  He was started on dronedarone and apixaban at the time.  He had mild elevation of troponin at that time, was felt due to demand ischemia in the setting of DKA/hypotension.  Echo with normal EF.  He followed up in the office January 2014, metoprolol was started with plan to continue dronedarone for a a few month. He late had a negative stress nuclear study on 04/22/2012 with a small, mild, fixed apical defect consistent with stenting, no ischemia.  He was lost to follow-up for some time. He was last seen by Dr. Percival Spanish in the office on 02/28/2018 duration of aortic atherosclerosis. He had stopped dronedarone and eliquis at the time due to nasal polyps and recurrent bleeding, remained without symptomatic recurrence of his A. Fib, therefore no further therapy was recommended.  He was recommended POET for evaluation of obstructive coronary disease due to significant risk factors, but never completed  the evaluation.  Patient presented to the ER on 09/27/2020 with  complaints of sudden onset left-sided vision loss, associated with headache, blurry vision, shortness of breath, cough.  He was evaluated by neurology, exam is significant for a left homonymous hemianopsia.  CTH without contrast revealed right occipital lobe infarct, mild mass-effect on right side of quadrigeminal plate cistern and significant mass-effect de septal horn of the right lateral ventricle, no midline shift.  Patient was not a candidate for tPA and thrombectomy.  He was subsequently admitted to hospital medicine.  Subsequent CTA head and neck revealed no proximal intracranial vessel occlusion, no large vessel occlusion of neck, mild plaque of left vertebral origin, plaque at ICA origin <50 % stenosis.  MRI of brain on 6/13 revealed moderately large evolving acute to early subacute right PCA distribution infarct with extensive hemorrhagic conversion.  LDL 128.  Hemoglobin A1c 8.7%.  Stroke etiology was felt cardioembolic in origin with underlying atrial fibrillation without anticoagulation versus low EF.  Patient is agreeable and willing to try Xarelto, anticoagulation is planned  in 10-14 days due to hemorrhagic conversion and pending repeat brain imaging.   Echocardiogram from 09/28/2020 revealed EF 30 to 35%, LV moderately decreased function, LV global hypokinesis, grade 2 DD, LV apex not well, visualized, elevated left atrial pressure, RV normal, no significant valvular disease.  Cardiology is consulted for abnormal echo.   During encounter, patient states he is feeling better, vision is still poor but stable, had his first therapy today, difficult to read. He states he had ongoing SOB for 2 months. He noted increased SOB at night when he is laying flat. He noted intermittent dry cough at night. He did not pay much  attention of his leg and was not aware of th swelling. He had to sleep in the chair because of SOB. He denied any chest pain, chest pressure, heart palpitation, heart flutter sensation,  dizziness in the past. He states he quit tobacco abuse in 1990s. He used to drink liquor 1-2 oz daily but has quit 2-3 month ago. He has quit using Marijuana 2-3 month ago as well. He thought his SOB and cough are due to allergy symptoms, but allergy medication did not make any difference. He states he had no workup for his heart since 2014, did not obtain POET because his heart was not bothering him at the time. He reports his mother had fast heart rate and father had aortic aneurysm in the past, no family hx of MI or CAD.      Past Medical History:  Diagnosis Date   Allergic rhinitis    Anemia    Atrial fibrillation ([place])    Persistent   Cancer of ascending colon, 7cm 05/25/2011   Diabetes mellitus type I ([place])    Glaucoma    Hypertension    Pneumonia 1979 or 1980   Prostate nodule    Substance abuse [facility])     Past Surgical History:  Procedure Laterality Date   broken left shoulder blade and collar bone     FINGER SURGERY  2009   right middle   LAPAROSCOPIC ASSISTED 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     NASAL FRACTURE SURGERY  1968   PORTACATH PLACEMENT  07/01/2011   Procedure: INSERTION PORT-A-CATH;  Surgeon: Adin Hector, MD;  Location: [facility] ORS;  Service: General;  Laterality: Left;  Insertion of Port-A-Catheter Left Internal Jugular   TONSILLECTOMY  1957 - approximate     Home Medications:  Prior to Admission medications   Medication Sig Start Date End Date Taking? Authorizing Provider  acetaminophen (TYLENOL) 500 MG tablet Take 1,000 mg by mouth every 6 (six) hours as needed for mild pain or headache. Pain   Yes [provider]  Ascorbic Acid (VITAMIN C PO) Take 1 tablet by mouth every morning.    Yes [provider]  glucose blood test strip Use as instructed Patient taking differently: Check blood sugar before meals and hs 04/10/13  Yes Barton Dubois, MD  insulin aspart (NOVOLOG) 100 UNIT/ML injection INJECT 8-15 UNITS THREE TIMES DAILY BEFORE  MEALS Patient taking differently: Inject 8-15 Units into the skin 3 (three) times daily with meals. 04/02/20  Yes Dettinger, Fransisca Kaufmann, MD  insulin degludec (TRESIBA FLEXTOUCH) 100 UNIT/ML FlexTouch Pen Inject 20 Units into the skin every morning. Patient taking differently: Inject 14 Units into the skin every morning. 04/02/20  Yes Dettinger, Fransisca Kaufmann, MD  levocetirizine (XYZAL) 5 MG tablet Take 1 tablet (5 mg total) by mouth daily. Patient taking differently: Take 5 mg by mouth every evening. 04/02/20  Yes Dettinger, Fransisca Kaufmann, MD  lisinopril (ZESTRIL) 10 MG tablet Take 1 tablet (10 mg total) by mouth daily. 04/02/20  Yes Dettinger, Fransisca Kaufmann, MD  Multiple Vitamin (MULTIVITAMIN) tablet Take 1 tablet by mouth every morning.    Yes [provider]  simvastatin (ZOCOR) 40 MG tablet TAKE 1 TABLET BY MOUTH EVERYDAY AT BEDTIME Patient taking differently: Take 40 mg by mouth at bedtime. 04/02/20  Yes Dettinger, Fransisca Kaufmann, MD  triamcinolone (NASACORT) 55 MCG/ACT AERO nasal inhaler INSTILL 1 SPRAY IN EACH NOSTRIL ONCE OR TWICE DAILY AS NEEDED. Patient taking differently: Place 2 sprays into the nose daily. 09/04/17  Yes Chipper Herb, MD  Continuous Blood Gluc Receiver (FREESTYLE LIBRE 2 READER) DEVI USE IN THE MORNING, AT NOON, IN THE EVENING, AND AT BEDTIME AS DIRECTED Dx E10.9 08/25/20   Dettinger, Fransisca Kaufmann, MD  Continuous Blood Gluc Sensor (FREESTYLE LIBRE 2 SENSOR) MISC 1 each by Does not apply route every 14 (fourteen) days 08/25/20   Dettinger, Fransisca Kaufmann, MD  Tafluprost, PF, (ZIOPTAN) 0.0015 % SOLN Place 1 drop into both eyes at bedtime.  Patient not taking: No sig reported    [provider]    Inpatient Medications: Scheduled Meds:   stroke: mapping our early stages of recovery book   Does not apply Once   aspirin EC  81 mg Oral Daily   atorvastatin  40 mg Oral Daily   enoxaparin (LOVENOX) injection  40 mg Subcutaneous Q24H   insulin aspart  0-15 Units Subcutaneous TID WC    insulin aspart  0-5 Units Subcutaneous QHS   insulin aspart  4 Units Subcutaneous TID WC   insulin glargine  15 Units Subcutaneous BID   loratadine  10 mg Oral QPM   melatonin  3 mg Oral QHS   multivitamin with minerals  1 tablet Oral BH-q7a   triamcinolone  2 spray Nasal Daily   Continuous Infusions:  PRN Meds: acetaminophen **OR** acetaminophen (TYLENOL) oral liquid 160 mg/5 mL **OR** acetaminophen, traMADol  Allergies:    Allergies  Allergen Reactions   Oxycodone Nausea And Vomiting    Social History:   Social History   Socioeconomic History   Marital status: Single    Spouse name: Not on file   Number of children: 0   Years of education: 16   Highest education level: Bachelor's degree (e.g., BA, AB, BS)  Occupational History   Occupation: Retired    Comment: Press photographer  Tobacco Use   Smoking status: Former    Packs/day: 1.00    Years: 15.00    Pack years: 15.00    Types: Cigarettes    Quit date: 04/18/1993    Years since quitting: 27.4   Smokeless tobacco: Never   Tobacco comments:    marijuana every night   Vaping Use   Vaping Use: Never used  Substance and Sexual Activity   Alcohol use: Yes    Alcohol/week: 5.0 standard drinks    Types: 3 Cans of beer, 2 Shots of liquor per week    Comment: 1 drink every 2 days   Drug use: Yes    Types: Marijuana    Comment: once a night marijuana   Sexual activity: Not Currently  Other Topics Concern   Not on file  Social History Narrative   Lives alone.  Retired from [employer].    Social Determinants of Health   Financial Resource Strain: Low Risk    Difficulty of Paying Living Expenses: Not hard at all  Food Insecurity: No Food Insecurity   Worried About Charity fundraiser in the Last Year: Never true   [place] in the Last Year: Never true  Transportation Needs: No Transportation Needs   Lack of Transportation (Medical): No   Lack of Transportation (Non-Medical): No  Physical Activity: Sufficiently  Active   Days of Exercise per Week: 5 days   Minutes of Exercise per Session: 40 min  Stress: No Stress Concern Present   Feeling of Stress : Only a little  Social Connections: Moderately Isolated   Frequency of Communication with Friends and Family: More than three times  a week   Frequency of Social Gatherings with Friends and Family: Three times a week   Attends Religious Services: 1 to 4 times per year   Active Member of Clubs or Organizations: No   Attends Archivist Meetings: Never   Marital Status: Never married  Human resources officer Violence: Not At Risk   Fear of Current or Ex-Partner: No   Emotionally Abused: No   Physically Abused: No   Sexually Abused: No    Family History:    Family History  Problem Relation Age of Onset   Colon polyps Father    Lung cancer Father    Diabetes Father    Prostate cancer Father    Breast cancer Mother    Pancreatitis Mother        intestinal adhesions   Insomnia Mother    Colon cancer Neg Hx    Rectal cancer Neg Hx      ROS:  Vitals:  Vitals:   09/29/20 1222 09/29/20 1540  BP: 130/84 115/83  Pulse: 98 87  Resp: 18 16  Temp: 98.7 F (37.1 C) 98.1 F (36.7 C)  SpO2: 98% 98%   Constitutional: Denied fever, chills, malaise, night sweats Eyes: see HPI  Ears/Nose/Mouth/Throat: Denied ear ache, sore throat, sinus pain Cardiovascular: denied chest pain/pressure Respiratory: see HPI Gastrointestinal: Denied nausea, vomiting, abdominal pain, diarrhea Genital/Urinary: Denied dysuria, hematuria, urinary frequency/urgency Musculoskeletal: Denied muscle ache, joint pain, weakness Skin: Denied rash, wound Neuro: Denied headache, dizziness, syncope Psych: Denied history of depression/anxiety  Endocrine: history of diabetes   Physical Exam/Data:   Vitals:   09/29/20 0330 09/29/20 0728 09/29/20 1222 09/29/20 1540  BP: 132/80 127/84 130/84 115/83  Pulse: (!) 104 98 98 87  Resp: 18 16 18 16   Temp: 98.6 F (37 C) 98.2 F  (36.8 C) 98.7 F (37.1 C) 98.1 F (36.7 C)  TempSrc: Oral Oral Oral Oral  SpO2: 94% 96% 98% 98%  Weight:      Height:        Intake/Output Summary (Last 24 hours) at 09/29/2020 1620 Last data filed at 09/28/2020 2240 Gross per 24 hour  Intake 240 ml  Output 350 ml  Net -110 ml   Last 3 Weights 09/28/2020 08/24/2020 04/02/2020  Weight (lbs) 172 lb 172 lb 176 lb  Weight (kg) 78.019 kg 78.019 kg 79.833 kg  Some encounter information is confidential and restricted. Go to Review Flowsheets activity to see all data.     Body mass index is 21.5 kg/m.   General Appearance: In no apparent distress, sitting in chair  HEENT: Normocephalic, atraumatic.  Neck: Supple, trachea midline, no JVDs Cardiovascular: Regular rate and rhythm, normal S1-S2,  no murmur/rub/gallop Respiratory: Resting breathing unlabored, lungs sounds clear to auscultation bilaterally, no use of accessory muscles. On 2L [place] No wheezes, rales or rhonchi.   Gastrointestinal: Bowel sounds positive, abdomen soft, non-tender, non-distended. .  Extremities: Able to move all extremities in bed without difficulty, 1+ pitting BLE edema Genitourinary:  genital exam not performed Musculoskeletal: Normal muscle bulk and tone Skin: Intact, warm, dry. No rashes or petechiae noted in exposed areas.  Neurologic: Alert, oriented to person, place and time. Fluent speech, no cognitive deficit,  no focal muscle weakness  Psychiatric: Normal affect. Mood is appropriate.   EKG:  The EKG was personally reviewed and demonstrates:  Sinus tachycardia 116 bpm with PACs, non-specific changes   Telemetry:  Telemetry was personally reviewed and demonstrates:  Sinus rhythm with PACs and occasional PVCs ,  no a fib   Relevant CV Studies:   Echo from 09/28/20:  IMPRESSIONS     1. Left ventricular ejection fraction, by estimation, is 30 to 35%. The  left ventricle has moderately decreased function. The left ventricle  demonstrates global  hypokinesis. Left ventricular diastolic parameters are  consistent with Grade II diastolic  dysfunction (pseudonormalization). Elevated left atrial pressure.   2. LV apex not well visualized. In setting of CVA with LV systolic  dysfunction, would consider repeat limited echo with contrast to rule out  apical thrombus   3. Right ventricular systolic function is normal. The right ventricular  size is normal. Tricuspid regurgitation signal is inadequate for assessing  PA pressure.   4. The mitral valve is normal in structure. No evidence of mitral valve  regurgitation. No evidence of mitral stenosis.   5. The aortic valve was not well visualized. Aortic valve regurgitation  is not visualized. No aortic stenosis is present.   6. The inferior vena cava is normal in size with greater than 50%  respiratory variability, suggesting right atrial pressure of 3 mmHg.  Cardiology nuclear study 05/02/2012:  Normal stress nuclear study with a small, mild, fixed apical defect consistent with thinning, no ischemia, LVEF 53%, NL LV function, NL wall motion   Echo from  03/11/2012   - Left ventricle: The cavity size was at the upper limits of normal. Wall thickness was normal. The estimated ejection fraction was 55%. Wall motion was normal; there were no regional wall motion abnormalities. Doppler parameters are consistent with abnormal left ventricular relaxation (grade 1 diastolic dysfunction).  - Impressions: Technically limited study due to poor sound wave transmission.  Laboratory Data:  High Sensitivity Troponin:  No results for input(s): TROPONINIHS in the last 720 hours.   Chemistry Recent Labs  Lab 09/27/20 1559 09/27/20 2115 09/28/20 0732 09/29/20 0546  NA 132* 134* 134* 131*  K 5.2* 3.9 4.2 4.2  CL 103  --  102 102  CO2 19*  --  24 20*  GLUCOSE 435*  --  123* 251*  BUN 25*  --  19 18  CREATININE 1.63*  --  1.45* 1.34*  CALCIUM 9.1  --  8.9 8.6*  GFRNONAA 45*  --  52* 57*   ANIONGAP 10  --  8 9    No results for input(s): PROT, ALBUMIN, AST, ALT, ALKPHOS, BILITOT in the last 168 hours. Hematology Recent Labs  Lab 09/27/20 1559 09/27/20 2115  WBC 8.6  --   RBC 4.68  --   HGB 14.3 17.0  HCT 43.5 50.0  MCV 92.9  --   MCH 30.6  --   MCHC 32.9  --   RDW 12.6  --   PLT 148*  --    BNP Recent Labs  Lab 09/27/20 1905  BNP 1,566.5*    DDimer No results for input(s): DDIMER in the last 168 hours.   Radiology/Studies:  CT Angio Head W or Wo Contrast  Result Date: 09/27/2020 CLINICAL DATA:  Change in vision EXAM: CT ANGIOGRAPHY HEAD AND NECK TECHNIQUE: Multidetector CT imaging of the head and neck was performed using the standard protocol during bolus administration of intravenous contrast. Multiplanar CT image reconstructions and MIPs were obtained to evaluate the vascular anatomy. Carotid stenosis measurements (when applicable) are obtained utilizing NASCET criteria, using the distal internal carotid diameter as the denominator. CONTRAST:  64mL OMNIPAQUE IOHEXOL 350 MG/ML SOLN COMPARISON:  None. FINDINGS: CTA NECK Aortic arch: Great vessel origins are patent.  Right carotid system: Patent. Noncalcified plaque along the common carotid with less than 50% stenosis. Calcified plaque along the proximal internal carotid with less than 50% stenosis. Left carotid system: Patent. Mixed plaque at the internal carotid origin with less than 50% stenosis. Vertebral arteries: Patent and codominant. Mild plaque at the left vertebral origin. Skeleton: No significant osseous abnormality. Other neck: Unremarkable. Upper chest: Bilateral pleural effusions. Review of the MIP images confirms the above findings CTA HEAD Anterior circulation: Intracranial internal carotid arteries are patent with calcified plaque causing mild stenosis. Anterior and middle cerebral arteries are patent. Posterior circulation: Intracranial vertebral arteries are patent. Basilar artery is patent. Major  cerebellar artery origins are patent. Left posterior cerebral artery is patent. No proximal right PCA occlusion. There is some right P3 and P4 irregularity with possible occlusion of a proximal P4 branch noted. Venous sinuses: Patent as allowed by contrast bolus timing. Review of the MIP images confirms the above findings IMPRESSION: There is no proximal intracranial vessel occlusion; specifically no proximal right PCA occlusion. Irregularity more distally with possible proximal P4 branch occlusion. No large vessel occlusion in the neck. Mild plaque at the left vertebral origin. Plaque at the ICA origins causes less than 50% stenosis. Bilateral pleural effusions. Electronically Signed   By: Macy Mis M.D.   On: 09/27/2020 19:56   DG Chest 2 View  Result Date: 09/29/2020 CLINICAL DATA:  Cough, weakness, dyspnea, code stroke EXAM: CHEST - 2 VIEW COMPARISON:  09/27/2020 FINDINGS: Normal heart size mediastinal contours. Bibasilar pleural effusions and atelectasis similar to prior study. Upper lungs clear. LEFT apex scarring stable. No new infiltrate or pneumothorax. Bones demineralized. IMPRESSION: Persistent bibasilar pleural effusions and atelectasis slightly greater on LEFT, unchanged. Electronically Signed   By: Lavonia Dana M.D.   On: 09/29/2020 13:38   DG Chest 2 View  Result Date: 09/27/2020 CLINICAL DATA:  Cough and shortness of breath. EXAM: CHEST - 2 VIEW COMPARISON:  Chest x-ray dated Sep 04, 2017. FINDINGS: The heart size and mediastinal contours are within normal limits. Pulmonary vascular congestion. Small bilateral pleural effusions and bibasilar atelectasis. No pneumothorax. No acute osseous abnormality. IMPRESSION: 1. Pulmonary vascular congestion and small bilateral pleural effusions. Electronically Signed   By: Titus Dubin M.D.   On: 09/27/2020 16:12   CT Head Wo Contrast  Result Date: 09/27/2020 CLINICAL DATA:  Acute stroke suspected. Headache. Left vision changes. EXAM: CT HEAD  WITHOUT CONTRAST TECHNIQUE: Contiguous axial images were obtained from the base of the skull through the vertex without intravenous contrast. COMPARISON:  None. FINDINGS: Brain: No subdural, epidural, or subarachnoid hemorrhage. There is an infarct in the right occipital lobe. No other sites of cortical ischemia or infarct identified. The occipital horn of the right lateral ventricle is compressed due to mass effect from the infarct. The ventricles are otherwise normal. No midline shift. Basal cisterns are patent although there is mild narrowing of the quadrigeminal plate cistern on the right due to the infarct. The cerebellum and brainstem are normal. Vascular: Calcified atherosclerosis is seen in the intracranial carotids. Skull: Normal. Negative for fracture or focal lesion. Sinuses/Orbits: No acute finding. Other: None. IMPRESSION: 1. Right occipital lobe infarct consistent with the patient's symptoms. The infarct exerts mild mass effect on the right side of the quadrigeminal plate cistern and significant mass effect on the occipital horn of the right lateral ventricle. There is no midline shift however. There is likely punctate hemorrhage scattered throughout the infarct. No large hematoma. 2. No other abnormalities.  Electronically Signed   By: Dorise Bullion III M.D   On: 09/27/2020 19:38   CT Angio Neck W and/or Wo Contrast  Result Date: 09/27/2020 CLINICAL DATA:  Change in vision EXAM: CT ANGIOGRAPHY HEAD AND NECK TECHNIQUE: Multidetector CT imaging of the head and neck was performed using the standard protocol during bolus administration of intravenous contrast. Multiplanar CT image reconstructions and MIPs were obtained to evaluate the vascular anatomy. Carotid stenosis measurements (when applicable) are obtained utilizing NASCET criteria, using the distal internal carotid diameter as the denominator. CONTRAST:  33mL OMNIPAQUE IOHEXOL 350 MG/ML SOLN COMPARISON:  None. FINDINGS: CTA NECK Aortic arch:  Great vessel origins are patent. Right carotid system: Patent. Noncalcified plaque along the common carotid with less than 50% stenosis. Calcified plaque along the proximal internal carotid with less than 50% stenosis. Left carotid system: Patent. Mixed plaque at the internal carotid origin with less than 50% stenosis. Vertebral arteries: Patent and codominant. Mild plaque at the left vertebral origin. Skeleton: No significant osseous abnormality. Other neck: Unremarkable. Upper chest: Bilateral pleural effusions. Review of the MIP images confirms the above findings CTA HEAD Anterior circulation: Intracranial internal carotid arteries are patent with calcified plaque causing mild stenosis. Anterior and middle cerebral arteries are patent. Posterior circulation: Intracranial vertebral arteries are patent. Basilar artery is patent. Major cerebellar artery origins are patent. Left posterior cerebral artery is patent. No proximal right PCA occlusion. There is some right P3 and P4 irregularity with possible occlusion of a proximal P4 branch noted. Venous sinuses: Patent as allowed by contrast bolus timing. Review of the MIP images confirms the above findings IMPRESSION: There is no proximal intracranial vessel occlusion; specifically no proximal right PCA occlusion. Irregularity more distally with possible proximal P4 branch occlusion. No large vessel occlusion in the neck. Mild plaque at the left vertebral origin. Plaque at the ICA origins causes less than 50% stenosis. Bilateral pleural effusions. Electronically Signed   By: Macy Mis M.D.   On: 09/27/2020 19:56   MR BRAIN WO CONTRAST  Result Date: 09/28/2020 CLINICAL DATA:  Initial evaluation for acute stroke, visual disturbance. EXAM: MRI HEAD WITHOUT CONTRAST TECHNIQUE: Multiplanar, multiecho pulse sequences of the brain and surrounding structures were obtained without intravenous contrast. COMPARISON:  Prior CTs from earlier the same day. FINDINGS: Brain:  Cerebral volume within normal limits for age. Small remote lacunar infarct at the left thalamus. No other significant small vessel disease for age. Moderately large evolving acute to early subacute right PCA distribution infarct involving the right temporal occipital region is seen, corresponding with abnormality on prior CT (series 5, image 70). Extensive susceptibility artifact seen throughout the area of infarction, consistent with associated hemorrhage. No frank intraparenchymal hematoma. Trace blood products seen layering within the occipital horns of both lateral ventricles, likely related to redistribution of small volume associated subarachnoid blood (series 14, image 28). No significant midline shift or regional mass effect. No hydrocephalus or trapping. No other evidence for acute or subacute ischemia. Gray-white matter differentiation otherwise maintained. No other areas of remote cortical infarction. No other evidence for acute or chronic intracranial hemorrhage. No mass lesion, midline shift or mass effect. No extra-axial fluid collection. Pituitary gland suprasellar region normal. Midline structures intact. Vascular: Major intracranial vascular flow voids are maintained. Skull and upper cervical spine: Craniocervical junction within normal limits. Bone marrow signal intensity normal. No scalp soft tissue abnormality. Sinuses/Orbits: Globes and orbital soft tissues within normal limits. Mild scattered mucosal thickening noted within the ethmoidal air cells and  maxillary sinuses. Paranasal sinuses are otherwise clear. No significant mastoid effusion. Inner ear structures grossly normal. Other: None. IMPRESSION: 1. Moderately large evolving acute to early subacute hemorrhagic right PCA distribution infarct. No significant mass effect. 2. Trace blood products layering within the occipital horns of both lateral ventricles, likely related to redistribution of small volume associated subarachnoid blood. No  hydrocephalus or trapping. 3. Small remote left thalamic lacunar infarct. Electronically Signed   By: Jeannine Boga M.D.   On: 09/28/2020 00:45   ECHOCARDIOGRAM COMPLETE  Result Date: 09/28/2020    ECHOCARDIOGRAM REPORT   Patient Name:   DARRIEN BELTER Date of Exam: 09/28/2020 Medical Rec #:  [id number]          Height:       75.0 in Accession #:    [id number]         Weight:       172.0 lb Date of Birth:  1950/06/26          BSA:          2.058 m Patient Age:    89 years           BP:           126/82 mmHg Patient Gender: M                  HR:           95 bpm. Exam Location:  Inpatient Procedure: 2D Echo, Cardiac Doppler and Color Doppler Indications:    Stroke  History:        Patient has prior history of Echocardiogram examinations, most                 recent 03/11/2012. Risk Factors:Former Smoker and Diabetes.  Sonographer:    Cammy Brochure Referring Phys: [place]  1. Left ventricular ejection fraction, by estimation, is 30 to 35%. The left ventricle has moderately decreased function. The left ventricle demonstrates global hypokinesis. Left ventricular diastolic parameters are consistent with Grade II diastolic dysfunction (pseudonormalization). Elevated left atrial pressure.  2. LV apex not well visualized. In setting of CVA with LV systolic dysfunction, would consider repeat limited echo with contrast to rule out apical thrombus  3. Right ventricular systolic function is normal. The right ventricular size is normal. Tricuspid regurgitation signal is inadequate for assessing PA pressure.  4. The mitral valve is normal in structure. No evidence of mitral valve regurgitation. No evidence of mitral stenosis.  5. The aortic valve was not well visualized. Aortic valve regurgitation is not visualized. No aortic stenosis is present.  6. The inferior vena cava is normal in size with greater than 50% respiratory variability, suggesting right atrial pressure of 3 mmHg.  FINDINGS  Left Ventricle: Left ventricular ejection fraction, by estimation, is 30 to 35%. The left ventricle has moderately decreased function. The left ventricle demonstrates global hypokinesis. The left ventricular internal cavity size was normal in size. There is no left ventricular hypertrophy. Left ventricular diastolic parameters are consistent with Grade II diastolic dysfunction (pseudonormalization). Elevated left atrial pressure. Right Ventricle: The right ventricular size is normal. No increase in right ventricular wall thickness. Right ventricular systolic function is normal. Tricuspid regurgitation signal is inadequate for assessing PA pressure. Left Atrium: Left atrial size was normal in size. Right Atrium: Right atrial size was normal in size. Pericardium: There is no evidence of pericardial effusion. Mitral Valve: The mitral valve is normal in structure. No evidence of  mitral valve regurgitation. No evidence of mitral valve stenosis. Tricuspid Valve: The tricuspid valve is normal in structure. Tricuspid valve regurgitation is not demonstrated. Aortic Valve: The aortic valve was not well visualized. Aortic valve regurgitation is not visualized. No aortic stenosis is present. Aortic valve mean gradient measures 2.5 mmHg. Aortic valve peak gradient measures 4.0 mmHg. Aortic valve area, by VTI measures 2.10 cm. Pulmonic Valve: The pulmonic valve was not well visualized. Pulmonic valve regurgitation is not visualized. Aorta: The aortic root and ascending aorta are structurally normal, with no evidence of dilitation. Venous: The inferior vena cava is normal in size with greater than 50% respiratory variability, suggesting right atrial pressure of 3 mmHg. IAS/Shunts: The interatrial septum was not well visualized. Additional Comments: There is a small pleural effusion in the left lateral region.  LEFT VENTRICLE PLAX 2D LVIDd:         5.30 cm  Diastology LVIDs:         3.80 cm  LV e' medial:    5.98 cm/s LV  PW:         1.20 cm  LV E/e' medial:  16.3 LV IVS:        0.90 cm  LV e' lateral:   8.38 cm/s LVOT diam:     2.15 cm  LV E/e' lateral: 11.6 LV SV:         39 LV SV Index:   19 LVOT Area:     3.63 cm  RIGHT VENTRICLE             IVC RV Basal diam:  3.60 cm     IVC diam: 1.60 cm RV S prime:     11.10 cm/s TAPSE (M-mode): 1.8 cm LEFT ATRIUM             Index       RIGHT ATRIUM           Index LA diam:        3.60 cm 1.75 cm/m  RA Area:     15.10 cm LA Vol (A2C):   60.1 ml 29.20 ml/m RA Volume:   46.00 ml  22.35 ml/m LA Vol (A4C):   56.6 ml 27.50 ml/m LA Biplane Vol: 59.3 ml 28.81 ml/m  AORTIC VALVE AV Area (Vmax):    2.28 cm AV Area (Vmean):   2.01 cm AV Area (VTI):     2.10 cm AV Vmax:           100.45 cm/s AV Vmean:          75.450 cm/s AV VTI:            0.186 m AV Peak Grad:      4.0 mmHg AV Mean Grad:      2.5 mmHg LVOT Vmax:         63.10 cm/s LVOT Vmean:        41.800 cm/s LVOT VTI:          0.108 m LVOT/AV VTI ratio: 0.58  AORTA Ao Root diam: 3.05 cm Ao Asc diam:  2.65 cm MITRAL VALVE MV Area (PHT): 7.44 cm    SHUNTS MV Decel Time: 102 msec    Systemic VTI:  0.11 m MV E velocity: 97.30 cm/s  Systemic Diam: 2.15 cm MV A velocity: 42.80 cm/s MV E/A ratio:  2.27 Oswaldo Milian MD Electronically signed by Oswaldo Milian MD Signature Date/Time: 09/28/2020/11:17:35 AM    Final      Assessment and Plan:  Acute systolic and diastolic heart failure, new onset -Presented with acute vision loss, found to have acute right PCA CVA with subsequent hemorrhagic conversion - patient also c/o 2 month of orthopnea, dry cough - BNP 1566, POA - CXR admission revealed pulmonary vascular congestion and small bilateral pleural effusions, POA - Echo showed EF 30 to 35%, LV moderately decreased function, LV global hypokinesis, grade 2 DD, LV apex not well, visualized, elevated left atrial pressure, RV normal, no significant valvular disease. (EF lower from 02/2012) - limited Echo pending to further  evaluate LV apex and thrombus, recommend TEE in 2 weeks prior to start [facility]  - clinically with 1+ pitting edema of BLE, mild hypervolemia , recommend start low dose PO Lasix 20mg  daily if renal index improving and no longer need permissive HTN  - weight 78kg POA, monitor daily weight and I&O - previously on lisinopril 10mg  for HTN at home - GDMT: recommend start low dose metoprolol, discontinue  lisinopril, start low dose  Entresto once outside of the window for permissive HTN and AKI resolves,  if BP /renal allows, may add MRA  and SGLT2i - Etiology unclear, ischemic evaluation is reasonable once acute hemorraghic conversion stabilize, recommended outpatient right and left cardiac cath; discussed ETOH cessation   Paroxysmal atrial fibrillation - initially diagnosed in 2013 in the setting of DKA, dronedarone was continued for a few month and stopped, he had no symptomatic recurrence, currently remains in SR  - he was recommend eliquis for CVA ppx, but self discontinued due to intolerance with nasal polyps  - CHA2DS2VASc score is 7 due to age, HTN, CHF, DM, CVA, vascular disease; recommend anticoagulation to be resumed once neuro clears patient given current extensive hemorrhagic conversion, Xarelto versus Pradaxa can be option   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 - managed per neuro, will defer to neuro on recommendation on ASA or Plavix use while resume anticoagulation  - cardioembolic workup as above   Hypertension - BP 110/72 - 130 /84, well controled on no meds currently  - may start low dose metoprolol, entresto, lasix, DC lisinopril once outside of window of permissive HTN   Hyperlipidemia - LDL 128 on 09/28/20 - on lipitor 40mg , may consider 80mg  if neuro OK with high intensity lowering therapy in the setting of hemorrhagic conversion  Type 1 diabetes - A1C 8.7% on 09/28/20, can have better control - managed per primary team  - may add SGLT2i  AKI  - Cr 1.63 POA with  Lasix, downtrend to 1.34 with IVF, if recovers , may initiate diuretics  - managed per primary team      Risk Assessment/Risk Scores:        New York Heart Association (NYHA) Functional Class NYHA Class II  CHA2DS2-VASc Score = 7  This indicates a 11.2% annual risk of stroke. The patient's score is based upon: CHF History: Yes HTN History: Yes Diabetes History: Yes Stroke History: Yes Vascular Disease History: Yes Age Score: 1 Gender Score: 0       For questions or updates, please contact [place] Please consult [email or web address] for contact info under    Signed, Margie Billet, NP  09/29/2020 4:20 PM   Agree with assessment and plan by Margie Billet NP  We are asked to see this pleasant 70 year old Caucasian male with a stroke, presumably cardioembolic, with hemorrhagic transformation and echo that shows severe LV dysfunction with EF of 30%.  The patient has been evaluated by Dr. Aundra Dubin back in 2013 with  a negative Myoview at that time and by Dr. Percival Spanish in 2019.  He apparently drinks alcohol on a daily basis.  There is a question of PAF in the past although he has not been on oral anticoagulants.  He has complained of shortness of breath over the last several months but denies chest pain.  Plan is for permissive hypertension and to initiate oral anticoagulation after 10 to 14 days.  We will need to start guideline directed optimal medical therapy for his LV dysfunction including beta-blocker and low-dose Entresto as well as a low-dose diuretic.  Optimally, he should have a transesophageal echo to rule out embolic source and a right left heart cath to define the etiology of his LV dysfunction.  I have discussed this thoroughly with the patient and he agrees to the plan as outlined above.  Lorretta Harp, M.D., [place], [facility], [place] [address] [place], [place]  [address]  [phone] 09/29/2020 5:08 PM

## 2020-09-29 NOTE — Evaluation (Addendum)
Occupational Therapy Evaluation Patient Details Name: Greg Robertson MRN: [id number] DOB: May 23, 1950 Today's Date: 09/29/2020    History of Present Illness Pt is 70 yo male who had onset of visual changes 1 day prior to admission. CT showed R occipital CVA. PMH: Afib, poorly controlled DM1. 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 Today Pt is able to perform transfers and mobility at supervision level without LOB. He was able to functionally perform standing grooming tasks locating grooming items on the L without cues. He was able to perform toileting and peri care at mod I. His biggest deficit is the L homonymous hemianopsia. Today we focused on strategies for reading - OT made big red line and Pt would find red line all the way on L to ensure he was getting all the words.  Pt will need continued visual compensatory strategies in the acute level as well as afterwards at the OPOT level (neuro specific for VISION highly recommended)  Of note: Pt voiced concerns about reading his glucometer, getting the blood on the right place for sugar testing and also getting the right amount of insulin in the syringe.    Follow Up Recommendations  Outpatient OT (neuro specific for VISION)    Equipment Recommendations  None recommended by OT    Recommendations for Other Services       Precautions / Restrictions Precautions Precaution Comments: L homonymous hemianopsia on top of previous visual deficits R eye Restrictions Weight Bearing Restrictions: No      Mobility Bed Mobility Overal bed mobility: Independent                  Transfers Overall transfer level: Independent                    Balance Overall balance assessment: Modified  Independent                                         ADL either performed or assessed with clinical judgement   ADL Overall ADL's : Needs assistance/impaired Eating/Feeding: Modified independent;Sitting   Grooming: Wash/dry hands;Wash/dry face;Oral care;Supervision/safety;Standing Grooming Details (indicate cue type and reason): able to find items on the L side without cues Upper Body Bathing: Modified independent   Lower Body Bathing: Modified independent   Upper Body Dressing : Modified independent   Lower Body Dressing: Modified independent   Toilet Transfer: Supervision/safety;Ambulation Toilet Transfer Details (indicate cue type and reason): no bumping into anything, easy gait to bathroom Toileting- Clothing Manipulation and Hygiene: Independent       Functional mobility during ADLs: Supervision/safety General ADL Comments: needs further visual strategy education     Vision Baseline Vision/History: Wears glasses Wears Glasses: Reading only Patient Visual Report: Other (comment) (baseline R central visual deficits; now with L homonymous hemianopsia) Vision Assessment?: Yes Eye Alignment: Within Functional Limits Ocular Range of Motion: Within Functional Limits Visual Fields: Left homonymous hemianopsia Depth Perception: Overshoots Additional Comments: Pt provided with     Perception     Praxis      Pertinent Vitals/Pain Pain Assessment: Faces Pain Score: 0-No pain Pain Location: R forehead and posterior head Pain Descriptors / Indicators:  Headache Pain Intervention(s): Limited activity within patient's tolerance;Monitored during session     Hand Dominance Right   Extremity/Trunk Assessment Upper Extremity Assessment Upper Extremity Assessment: Overall WFL for tasks assessed   Lower Extremity Assessment Lower Extremity Assessment: Defer to PT evaluation   Cervical / Trunk Assessment Cervical / Trunk Assessment: Kyphotic    Communication Communication Communication: No difficulties   Cognition Arousal/Alertness: Awake/alert Behavior During Therapy: WFL for tasks assessed/performed Overall Cognitive Status: Within Functional Limits for tasks assessed                                     General Comments  Pt initially on 4L O2 when OT entered the room. On RA Pt saturated at >90% throughout session. Pt left on RA at the end of the session    Exercises     Shoulder Instructions      Home Living Family/patient expects to be discharged to:: Private residence Living Arrangements: Alone   Type of Home: House Home Access: Level entry     Home Layout: Two level;Able to live on main level with bedroom/bathroom     Bathroom Shower/Tub: Teacher, early years/pre: Standard     Home Equipment: Shower seat          Prior Functioning/Environment Level of Independence: Independent        Comments: drives, has store and pharmacy within walking distance, not great family support        OT Problem List: Impaired vision/perception      OT Treatment/Interventions: Visual/perceptual remediation/compensation    OT Goals(Current goals can be found in the care plan section) Acute Rehab OT Goals Patient Stated Goal: return home, learn strategies to assist with vision OT Goal Formulation: With patient Time For Goal Achievement: 10/13/20 Potential to Achieve Goals: Good ADL Goals Pt Will Perform Grooming: Independently;standing Additional ADL Goal #1: Pt will recall and utilize visual strategies to assit with reading with no verbal cues Additional ADL Goal #2: Pt with implement visual compensatory strategies with no cues to find objects on L side when performing ADL  OT Frequency: Min 3X/week   Barriers to D/C: Decreased caregiver support  Pt lives alone, Pt voiced concerns about medicine management and new visual deficits       Co-evaluation              AM-PAC OT  "6 Clicks" Daily Activity     Outcome Measure Help from another person eating meals?: A Little Help from another person taking care of personal grooming?: A Little Help from another person toileting, which includes using toliet, bedpan, or urinal?: None Help from another person bathing (including washing, rinsing, drying)?: None Help from another person to put on and taking off regular upper body clothing?: None Help from another person to put on and taking off regular lower body clothing?: None 6 Click Score: 22   End of Session Equipment Utilized During Treatment: Gait belt Nurse Communication: Mobility status;Precautions;Other (comment) (no chair alarm)  Activity Tolerance: Patient tolerated treatment well Patient left: in chair;Other (comment) (MD in room)  OT Visit Diagnosis: Low vision, both eyes (H54.2)                Time: 5053-9767 OT Time Calculation (min): 35 min Charges:  OT General Charges $OT Visit: 1 Visit OT Evaluation $OT Eval Moderate Complexity: 1 Mod OT Treatments $Self Care/Home Management : 8-22 mins  Jesse Sans OTR/L Acute Rehabilitation Services Pager: [phone] Office: [place] 09/29/2020, 12:14 PM

## 2020-09-29 NOTE — TOC Initial Note (Signed)
Transition of Care [facility]) - Initial/Assessment Note    Patient Details  Name: Greg Robertson MRN: [id number] Date of Birth: 02/04/1951  [facility]) CM/SW Contact:    Pollie Friar, RN Phone Number: 09/29/2020, 11:51 AM  Clinical Narrative:                 Patient states he lives alone. He may be able to stay with his sister at d/c if he is able to be independent with his insulin. He states if not he may need to get into an ALF. He is working on learning how to check his CBG and insulin doses using a magnifying glass.  Pt did not qualify for home oxygen currently. CM will follow.  Pt states he may need transportation resources at d/c depending on his disposition.  Has a shower seat, denies issues with other meds.  TOC following.  Expected Discharge Plan: Home/Self Care Barriers to Discharge: Continued Medical Work up   Patient Goals and CMS Choice        Expected Discharge Plan and Services Expected Discharge Plan: Home/Self Care   Discharge Planning Services: CM Consult   Living arrangements for the past 2 months: Single Family Home                                      Prior Living Arrangements/Services Living arrangements for the past 2 months: Single Family Home Lives with:: Self Patient language and need for interpreter reviewed:: Yes Do you feel safe going back to the place where you live?: Yes        Care giver support system in place?: No (comment)   Criminal Activity/Legal Involvement Pertinent to Current Situation/Hospitalization: No - Comment as needed  Activities of Daily Living      Permission Sought/Granted                  Emotional Assessment Appearance:: Appears stated age Attitude/Demeanor/Rapport: Engaged Affect (typically observed): Accepting Orientation: : Oriented to Self, Oriented to Place, Oriented to  Time, Oriented to Situation Alcohol / Substance Use: Alcohol Use Psych Involvement: No  (comment)  Admission diagnosis:  Pulmonary congestion [R09.89] Hyperkalemia [E87.5] Hyperglycemia [R73.9] Acute ischemic stroke (HCC) [I63.9] Elevated brain natriuretic peptide (BNP) level [R79.89] AKI (acute kidney injury) ([place]) [N17.9] Cerebrovascular accident (CVA), unspecified mechanism ([place]) [I63.9] Patient Active Problem List   Diagnosis Date Noted   Acute ischemic stroke ([place]) 09/27/2020   A-fib ([place]) 09/27/2020   AKI (acute kidney injury) ([place]) 09/27/2020   Left homonymous hemianopsia 09/27/2020   COPD (chronic obstructive pulmonary disease) ([place]) 09/05/2017   Aortic atherosclerosis ([place]) 09/04/2017   Benign prostatic hyperplasia 09/04/2017   Hyperlipidemia due to type 1 diabetes mellitus ([place]) 12/12/2013   Hypertension associated with diabetes ([place]) 03/14/2012   Acute on chronic diastolic CHF (congestive heart failure) ([place]) 03/12/2012   Lynch syndrome 07/28/2011   History of colon cancer 05/25/2011   DM type 1 (diabetes mellitus, type 1) ([place]) 05/10/2011   Glaucoma 05/10/2011   PCP:  Dettinger, Fransisca Kaufmann, MD Pharmacy:   [id number] - [place], [place] [address] Phone: [phone] Fax: [phone]     Social Determinants of Health (SDOH) Interventions    Readmission Risk Interventions No flowsheet data found.

## 2020-09-29 NOTE — TOC CAGE-AID Note (Signed)
Transition of Care [facility]) - CAGE-AID Screening   Patient Details  Name: Greg Robertson MRN: [id number] Date of Birth: [birth year]  Transition of Care [facility]) CM/SW Contact:    Pollie Friar, RN Phone Number: 09/29/2020, 11:46 AM   Clinical Narrative: Resources for inpatient/ outpatient counseling offered but pt states he quit a month ago and doesn't need the resources.   CAGE-AID Screening:    Have You Ever Felt You Ought to Cut Down on Your Drinking or Drug Use?: Yes Have People Annoyed You By [employer] Your Drinking Or Drug Use?: No Have You Felt Bad Or Guilty About Your Drinking Or Drug Use?: No Have You Ever Had a Drink or Used Drugs First Thing In The Morning to Steady Your Nerves or to Get Rid of a Hangover?: No CAGE-AID Score: 1  Substance Abuse Education Offered: Yes (patient states he quit alcohol and THC a month ago and feels he doesnt need counseling)

## 2020-09-29 NOTE — Progress Notes (Addendum)
STROKE TEAM PROGRESS NOTE    Interval History   No acute events overnight, patient educated about stroke, stroke risk factors and updated about his echo that showed reduced EF at 30-35 %.   Pertinent Lab Work and Imaging    09/28/20 CT Head WO IV Contrast 1. Right occipital lobe infarct consistent with the patient's symptoms. The infarct exerts mild mass effect on the right side of the quadrigeminal plate cistern and significant mass effect on the occipital horn of the right lateral ventricle. There is no midline shift however. There is likely punctate hemorrhage scattered throughout the infarct. No large hematoma. 2. No other abnormalities.  09/27/20 CT Angio Head and Neck W WO IV Contrast There is no proximal intracranial vessel occlusion; specifically no proximal right PCA occlusion. Irregularity more distally with possible proximal P4 branch occlusion.   No large vessel occlusion in the neck. Mild plaque at the left vertebral origin. Plaque at the ICA origins causes less than 50% stenosis.   Bilateral pleural effusions.  09/28/20 MRI Brain WO IV Contrast 1. Moderately large evolving acute to early subacute hemorrhagic right PCA distribution infarct. No significant mass effect. 2. Trace blood products layering within the occipital horns of both lateral ventricles, likely related to redistribution of small volume associated subarachnoid blood. No hydrocephalus or trapping. 3. Small remote left thalamic lacunar infarct.   09/28/20 Echocardiogram Complete   1. Left ventricular ejection fraction, by estimation, is 30 to 35%. The left ventricle has moderately decreased function. The left ventricle demonstrates global hypokinesis. Left ventricular diastolic parameters are consistent with Grade II diastolic dysfunction (pseudonormalization). Elevated left atrial pressure.   2. LV apex not well visualized. In setting of CVA with LV systolic dysfunction, would consider repeat limited echo with  contrast to rule out apical thrombus   3. Right ventricular systolic function is normal. The right ventricular size is normal. Tricuspid regurgitation signal is inadequate for assessing PA pressure.   4. The mitral valve is normal in structure. No evidence of mitral valve regurgitation. No evidence of mitral stenosis.   5. The aortic valve was not well visualized. Aortic valve regurgitation is not visualized. No aortic stenosis is present.   6. The inferior vena cava is normal in size with greater than 50% respiratory variability, suggesting right atrial pressure of 3 mmHg.   Physical Examination   Constitutional: Calm, appropriate for condition  Cardiovascular: Normal RR Respiratory: No increased WOB   Mental status: AAOx4, following commands  Speech: Fluent with repetition and naming intact  Cranial nerves: EOMI, L HH, Left facial droop,Tongue midline, Shoulder shrug intact  Motor: Normal bulk and tone. No drift. 5/5 strength throughout  Sensory: Intact to light tough throughout  Coordination: Intact FNF + HTS  Gait: Deferred   NIHSS: 3 for L HH/ Left facial droop   Assessment and Plan   Greg Robertson is a 70 y.o. 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   #R PCA Stroke  Patient presented with the symptoms described above. At this time, stroke work up is complete. MRI Brain revealed a R PCA stroke with extensive hemorrhagic conversion. Echo was pertinent for EF with reduced EF of 30 to 35 %, LV with global hypokinesis, LA normal in size. CTA Head and Neck with possible right P4 proximal occlusion, no significant stenosis to the posterior circulation. Stroke labs  w/LDL 128, Hemoglobin A1C 8.7. Stroke etiology is cardioembolic likely due to atrial fibrillation while not on anticoagulation versus reduced EF in the setting of heart failure. Discussed  anticoagulation with him and he is willing to try a different anticoagulant. States that he had boils within the nose when he tried Eliquis in the past. Given significant hemorrhagic conversion of stroke will defer on starting [facility] at this time and recommend to start in 10-14 days with repeat imaging done prior to starting.  - CTH in 10 days on 10/08/20 to evaluate stability of HT, if stable can start [facility] with Pradaxa. Considered Xarelto however it has similar mechanism to Eliquis thus recommend Pradaxa  - In the interim, before starting [facility], please continue Aspirin 81 mg for stroke prevention purposes and stop this when [facility] is started  - Continue Atorvastatin 40 mg for secondary stroke prevention -At discharge please place ambulatory referral to neurology for stroke follow up   #Hypertension He has has a history of HTN and takes Lisinopril 10 mg QD at home. Currently blood pressure is trending in the 110-120 range. Recommend permissive hypertension 48 hours post stroke and from there, gradually reduce the blood pressure, avoiding any acute drops. Long term blood pressure goal is < 140/90.  #Hyperlipidemia From a stroke prevention stand point, the LDL goal is < 70. His LDL is 128. He was taking Simvastatin at home however given LDL is at goal Atorvastatin 40 was initiated this admission. Continue this at discharge and stop home Simvastatin.   #Diabetes Type II  He has a history of diabetes and Hemoglobin A1C this admission is 8.7 From a stroke reduction stand point the goal A1C is < 7, recommend SSI management of diabetes while inpatient and follow up with PCP outpatient for diabetic management.   Hospital day # 2  Ruta Hinds, NP  [facility] Nurse Practitioner Patient seen and discussed with attending physician Dr. Erlinda Hong   Neurology will sign off at this time, thank you for consulting please reach out with any questions via Amion   ATTENDING NOTE: I reviewed above note and agree with  the assessment and plan. Pt was seen and examined.   No acute event overnight, neuro stable, patient lying in bed, still has left hemianopia.  Discussed with patient that he cannot drive until hemianopia resolution.  He expressed understanding.  Continue aspirin 81 at this time, however, repeat CT head in 10 days to document stability of hemorrhagic conversion, if so, will consider anticoagulation with Pradaxa (pt has eliquis side effect).  Continue statin.  Neurology will follow peripherally.  Please call us back once CT repeat done around 10/08/20. Pt will follow up with stroke clinic NP at [facility] in about 4 weeks after discharge.   Rosalin Hawking, MD PhD Stroke Neurology 09/29/2020 9:54 AM       To contact Stroke Continuity provider, please refer to [email or web address] After hours, contact General Neurology

## 2020-09-29 NOTE — Progress Notes (Signed)
SATURATION QUALIFICATIONS: (This note is used to comply with regulatory documentation for home oxygen)  Patient Saturations on Room Air at Rest = 89%  Patient Saturations on Room Air while Ambulating = 94%  Patient Saturations on 0 Liters of oxygen while Ambulating = 93%  Please briefly explain why patient needs home oxygen: Pt needs supplemental O2 while at rest or supine in the bed, but not during activity as his SpO2 remained >90% on RA with ambulation and activities of daily living.  Jesse Sans OTR/L Acute Rehabilitation Services Pager: [phone] Office: [phone]

## 2020-09-29 NOTE — Evaluation (Signed)
Clinical/Bedside Swallow Evaluation Patient Details  Name: Greg Robertson MRN: [id number] Date of Birth: 10-10-1950  Today's Date: 09/29/2020 Time: SLP Start Time (ACUTE ONLY): 1120 SLP Stop Time (ACUTE ONLY): 6283 SLP Time Calculation (min) (ACUTE ONLY): 12 min  Past Medical History:  Past Medical History:  Diagnosis Date   Allergic rhinitis    Anemia    Atrial fibrillation ([place])    Persistent   Cancer of ascending colon, 7cm 05/25/2011   Diabetes mellitus type I ([place])    Glaucoma    Hypertension    Pneumonia 1979 or 1980   Prostate nodule    Substance abuse ([place])    Past Surgical History:  Past Surgical History:  Procedure Laterality Date   broken left shoulder blade and collar bone     FINGER SURGERY  2009   right middle   LAPAROSCOPIC ASSISTED 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     NASAL FRACTURE SURGERY  1968   PORTACATH PLACEMENT  07/01/2011   Procedure: INSERTION PORT-A-CATH;  Surgeon: Adin Hector, MD;  Location: [facility] ORS;  Service: General;  Laterality: Left;  Insertion of Port-A-Catheter Left Internal Jugular   TONSILLECTOMY  1957 - approximate   HPI:  Pt is 70 yo male who had onset of visual changes 1 day prior to admission. CT showed R occipital CVA. PMH: Afib, poorly controlled DM1. former smoker, HTN, R eye deficits. Pt reports some difficulty swallowing vitamins occasionally; no other deficits reported.   Assessment / Plan / Recommendation Clinical Impression  Pt presents with normal swallowing function marked by adequate mastication of regular solids and good oral control; the appearance of a brisk swallow response; no s/s of aspiration, even with mixed consistencies. Pt endorses occasional difficulty swallowing pills, but denies frequent issues.  Recommend continuing a regular diet. No dysphagia identified.    Per discussion with pt and OT, cognition is at baseline - therefore no SLP language/cognitive evaluation is warranted.  SLP services will  sign off.   SLP Visit Diagnosis: Dysphagia, unspecified (R13.10)    Aspiration Risk  No limitations    Diet Recommendation   Regular solids, thin liquids  Medication Administration: Whole meds with liquid    Other  Recommendations Oral Care Recommendations: Oral care BID   Follow up Recommendations None        Swallow Study   General HPI: Pt is 70 yo male who had onset of visual changes 1 day prior to admission. CT showed R occipital CVA. PMH: Afib, poorly controlled DM1. former smoker, HTN, R eye deficits. Pt reports some difficulty swallowing vitamins occasionally; no other deficits reported. Type of Study: Bedside Swallow Evaluation Previous Swallow Assessment: no Diet Prior to this Study: Regular;Thin liquids Temperature Spikes Noted: No Respiratory Status: Room air History of Recent Intubation: No Behavior/Cognition: Alert;Cooperative Oral Cavity Assessment: Within Functional Limits Oral Care Completed by SLP: No Oral Cavity - Dentition: Adequate natural dentition Self-Feeding Abilities: Able to feed self Patient Positioning: Upright in chair Baseline Vocal Quality: Normal Volitional Cough: Strong Volitional Swallow: Able to elicit    Oral/Motor/Sensory Function Overall Oral Motor/Sensory Function: Within functional limits   Ice Chips Ice chips: Within functional limits   Thin Liquid Thin Liquid: Within functional limits    Nectar Thick Nectar Thick Liquid: Not tested   Honey Thick Honey Thick Liquid: Not tested   Puree Puree: Within functional limits   Solid     Solid: Within functional limits      Greg Robertson 09/29/2020,11:39 AM  Greg Robertson L. Tivis Ringer, [place] Office number [phone] Pager [phone]

## 2020-09-30 ENCOUNTER — Inpatient Hospital Stay (HOSPITAL_COMMUNITY): Payer: Medicare Other

## 2020-09-30 ENCOUNTER — Other Ambulatory Visit (HOSPITAL_COMMUNITY): Payer: Self-pay

## 2020-09-30 DIAGNOSIS — I5021 Acute systolic (congestive) heart failure: Secondary | ICD-10-CM

## 2020-09-30 DIAGNOSIS — R008 Other abnormalities of heart beat: Secondary | ICD-10-CM

## 2020-09-30 LAB — BASIC METABOLIC PANEL
Anion gap: 13 (ref 5–15)
BUN: 17 mg/dL (ref 8–23)
CO2: 21 mmol/L — ABNORMAL LOW (ref 22–32)
Calcium: 8.8 mg/dL — ABNORMAL LOW (ref 8.9–10.3)
Chloride: 101 mmol/L (ref 98–111)
Creatinine, Ser: 1.2 mg/dL (ref 0.61–1.24)
GFR, Estimated: >60 mL/min (ref >60)
Glucose, Bld: 66 mg/dL — ABNORMAL LOW (ref 70–99)
Potassium: 3.8 mmol/L (ref 3.5–5.1)
Sodium: 135 mmol/L (ref 135–145)

## 2020-09-30 LAB — ECHOCARDIOGRAM LIMITED
Height: 75 in
Weight: 2752 oz

## 2020-09-30 LAB — GLUCOSE, CAPILLARY
Glucose-Capillary: 60 mg/dL — ABNORMAL LOW (ref 70–99)
Glucose-Capillary: 132 mg/dL — ABNORMAL HIGH (ref 70–99)
Glucose-Capillary: 253 mg/dL — ABNORMAL HIGH (ref 70–99)
Glucose-Capillary: 53 mg/dL — ABNORMAL LOW (ref 70–99)
Glucose-Capillary: 175 mg/dL — ABNORMAL HIGH (ref 70–99)

## 2020-09-30 MED ORDER — FUROSEMIDE 20 MG PO TABS
20.0000 mg | ORAL_TABLET | Freq: Every day | ORAL | Status: DC
  Administered 2020-09-30 – 2020-10-01 (×2): 20 mg via ORAL
  Filled 2020-09-30 (×2): qty 1

## 2020-09-30 MED ORDER — ASPIRIN 81 MG PO TBEC
81.0000 mg | DELAYED_RELEASE_TABLET | Freq: Every day | ORAL | 11 refills | Status: DC
  Filled 2020-09-30: qty 30, 30d supply, fill #0

## 2020-09-30 MED ORDER — TUBERCULIN PPD 5 UNIT/0.1ML ID SOLN
5.0000 [IU] | Freq: Once | INTRADERMAL | Status: DC
  Administered 2020-09-30: 5 [IU] via INTRADERMAL
  Filled 2020-09-30: qty 0.1

## 2020-09-30 MED ORDER — ATORVASTATIN CALCIUM 40 MG PO TABS
40.0000 mg | ORAL_TABLET | Freq: Every day | ORAL | 3 refills | Status: DC
  Filled 2020-09-30: qty 30, 30d supply, fill #0

## 2020-09-30 MED ORDER — INSULIN PEN NEEDLE 32G X 4 MM MISC
0 refills | Status: DC
  Filled 2020-09-30: qty 100, 30d supply, fill #0

## 2020-09-30 MED ORDER — PERFLUTREN LIPID MICROSPHERE
1.0000 mL | INTRAVENOUS | Status: AC | PRN
  Administered 2020-09-30: 2 mL via INTRAVENOUS
  Filled 2020-09-30: qty 10

## 2020-09-30 MED ORDER — FUROSEMIDE 20 MG PO TABS
20.0000 mg | ORAL_TABLET | Freq: Every day | ORAL | 0 refills | Status: DC
  Filled 2020-09-30: qty 30, 30d supply, fill #0

## 2020-09-30 MED ORDER — TRESIBA FLEXTOUCH 100 UNIT/ML ~~LOC~~ SOPN
30.0000 [IU] | PEN_INJECTOR | SUBCUTANEOUS | 3 refills | Status: DC
  Filled 2020-09-30: qty 9, 30d supply, fill #0

## 2020-09-30 MED ORDER — CARVEDILOL 3.125 MG PO TABS: 3.1250 mg | ORAL_TABLET | Freq: Two times a day (BID) | ORAL | 11 refills | Status: DC

## 2020-09-30 NOTE — Progress Notes (Deleted)
Inpatient Diabetes Program Recommendations  AACE/ADA: New Consensus Statement on Inpatient Glycemic Control (2015)  Target Ranges:  Prepandial:   less than 140 mg/dL      Peak postprandial:   less than 180 mg/dL (1-2 hours)      Critically ill patients:  140 - 180 mg/dL   Lab Results  Component Value Date   GLUCAP 132 (H) 09/30/2020   HGBA1C 8.7 (H) 09/28/2020    Review of Glycemic Control Results for Greg Robertson, Greg Robertson" (MRN [id number]) as of 09/30/2020 09:58  Ref. Range 09/29/2020 06:33 09/29/2020 12:26 09/29/2020 15:38 09/29/2020 21:49 09/30/2020 05:48 09/30/2020 06:39  Glucose-Capillary Latest Ref Range: 70 - 99 mg/dL 271 (H) 90 141 (H) 103 (H) 60 (L) 132 (H)    Inpatient Diabetes Program Recommendations:    Lantus 12 BID Novolog 0-9 units TID  Will continue to follow while inpatient.  Thank you, Reche Dixon, RN, BSN Diabetes Coordinator Inpatient Diabetes Program [phone] (team pager from 8a-5p)

## 2020-09-30 NOTE — Progress Notes (Signed)
Occupational Therapy Progress Note  When attempting to exit room, pt ran into beside table on his Left side, tripping and falling into the sink vanity where he caught himself, and OT had to provide mod A to prevent him from fully falling.  He requires mod cues to scan Lt and Rt in hallway to find room numbers and requires extensive amount of time to complete task.  His sister was present.  Long discussion with she and pt re: his current deficits, need for post acute rehab, and the need for supervision/assist with community tasks, money and medication management.   Pt is currently unsafe to live alone due to visual impairments, and will need extensive post acute rehab to learn compensatory strategies to be able to return home with intermittent assist.  Recommend SNF level rehab.    09/30/20 1552  OT Visit Information  Last OT Received On 09/30/20  Assistance Needed +1  History of Present Illness Pt is 70 yo male who had onset of visual changes 1 day prior to admission. CT showed R occipital CVA. PMH: Afib, poorly controlled DM1. former smoker, HTN, R eye deficits  Precautions  Precautions Fall  Precaution Comments L homonymous hemianopsia on top of previous visual deficits R eye  Pain Assessment  Pain Assessment No/denies pain  Cognition  Arousal/Alertness Awake/alert  Behavior During Therapy WFL for tasks assessed/performed  Overall Cognitive Status Impaired/Different from baseline  Area of Impairment Awareness;Safety/judgement  Safety/Judgement Decreased awareness of deficits  Awareness Emergent  General Comments Pt demontrates decreased awareness of how visual deficits will impact him functionally.  He feels that if he can have therapy for a few days and learns to read that he will be able to live independently.  Very long discussion with him re: his deficits, their impact on funciton, risks with community ambulation and skills, etc  Upper Extremity Assessment  Upper Extremity Assessment  Overall WFL for tasks assessed  Lower Extremity Assessment  Lower Extremity Assessment Overall WFL for tasks assessed  ADL  Overall ADL's  Needs assistance/impaired  Eating/Feeding Modified independent;Sitting  Grooming Wash/dry hands;Wash/dry face;Oral care;Brushing hair;Supervision/safety;Standing  Upper Body Bathing Supervision/ safety;Standing  Lower Body Bathing Supervison/ safety;Sit to/from stand  Upper Body Dressing  Supervision/safety;Sitting;Standing  Lower Body Dressing Supervision/safety;Sit to/from Technical sales engineer- Forensic psychologist Independent  Functional mobility during ADLs Supervision/safety  General ADL Comments Pt requires supervision to safely perform set up for ADLs  Bed Mobility  Overal bed mobility Independent  Balance  Overall balance assessment Mild deficits observed, not formally tested  Vision- Assessment  Visual Fields Left homonymous hemianopsia  Other Exercises  Other Exercises sister present and upset with pt discharge and recommendations.  Long discussion with she and pt re: his deficits and their impact on function, and why he will need SNF, then possibly ALF, vs increased supports at home upon discharge.  long discussion re: risks of community ambulation and tasks such as grocery shopping with a homonymous hemianopsia  Other Exercises Pt stood to exit room, and was talking with therapist and his sister.  he proceeded to run into the beside table that had been to his left, then tripped over the leg of the bedside table.  He stumbled into the sink vanity, and caught himself with his Lt UE, but OT had to provide mod A to prevent him from fully falling.  Pt then worked on visual scanning task in the hallways - ambulating while reading room numbers.  His speed  of ambulation decreased significantly, and he required mod verbal cues to scan Lt and Rt and had mild LOB with task.  OT - End of Session  Equipment  Utilized During Treatment Gait belt  Activity Tolerance Patient tolerated treatment well  Patient left in bed;with call bell/phone within reach (EOB)  Nurse Communication Mobility status  OT Assessment/Plan  OT Plan Discharge plan remains appropriate  OT Visit Diagnosis Low vision, both eyes (H54.2)  OT Frequency (ACUTE ONLY) Min 3X/week  Follow Up Recommendations SNF  OT Equipment None recommended by OT  AM-PAC OT "6 Clicks" Daily Activity Outcome Measure (Version 2)  Help from another person eating meals? 3  Help from another person taking care of personal grooming? 3  Help from another person toileting, which includes using toliet, bedpan, or urinal? 3  Help from another person bathing (including washing, rinsing, drying)? 3  Help from another person to put on and taking off regular upper body clothing? 3  Help from another person to put on and taking off regular lower body clothing? 3  6 Click Score 18  OT Goal Progression  Progress towards OT goals Progressing toward goals  OT Time Calculation  OT Start Time (ACUTE ONLY) 1333  OT Stop Time (ACUTE ONLY) 1504  OT Time Calculation (min) 91 min  OT General Charges  $OT Visit 1 Visit  OT Treatments  $Therapeutic Activity 83-97 mins  Nilsa Nutting., OTR/L Acute Rehabilitation Services Pager [phone] Office [phone]

## 2020-09-30 NOTE — Progress Notes (Signed)
  Echocardiogram 2D Echocardiogram has been performed.  Bao Coreas G Blayze Haen 09/30/2020, 10:18 AM

## 2020-09-30 NOTE — NC FL2 (Signed)
[place] MEDICAID FL2 LEVEL OF CARE SCREENING TOOL     IDENTIFICATION  Patient Name: Greg Robertson Birthdate: [birth year] Sex: male Admission Date (Current Location): 09/27/2020  [facility] and [place] Number:  Herbalist and Address:  The . [facility], [place] [address] [place], [place] [address]      Provider Number: [id number]  Attending Physician Name and Address:  Charlynne Cousins, MD  Relative Name and Phone Number:  Kathrene Bongo, Sister [phone]    Current Level of Care: Hospital Recommended Level of Care: [place] Prior Approval Number:    Date Approved/Denied:   PASRR Number: [id number]  Discharge Plan: SNF    Current Diagnoses: Patient Active Problem List   Diagnosis Date Noted   Acute ischemic stroke ([place]) 09/27/2020   A-fib ([place]) 09/27/2020   AKI (acute kidney injury) ([place]) 09/27/2020   Left homonymous hemianopsia 09/27/2020   COPD (chronic obstructive pulmonary disease) ([place]) 09/05/2017   Aortic atherosclerosis ([place]) 09/04/2017   Benign prostatic hyperplasia 09/04/2017   Hyperlipidemia due to type 1 diabetes mellitus ([place]) 12/12/2013   Hypertension associated with diabetes ([place]) 03/14/2012   Acute on chronic diastolic CHF (congestive heart failure) ([place]) 03/12/2012   Lynch syndrome 07/28/2011   History of colon cancer 05/25/2011   DM type 1 (diabetes mellitus, type 1) ([place]) 05/10/2011   Glaucoma 05/10/2011    Orientation RESPIRATION BLADDER Height & Weight     Self, Time, Situation, Place  O2 (3L) Continent Weight: 172 lb (78 kg) Height:  6\' 3"  (190.5 cm)  BEHAVIORAL SYMPTOMS/MOOD NEUROLOGICAL BOWEL NUTRITION STATUS      Continent Diet  AMBULATORY STATUS COMMUNICATION OF NEEDS Skin   Limited Assist Verbally Normal                       Personal Care Assistance Level of Assistance  Bathing, Feeding, Dressing Bathing Assistance: Limited assistance Feeding assistance:  Independent Dressing Assistance: Limited assistance     Functional Limitations Info  Sight, Hearing, Speech Sight Info: Impaired Hearing Info: Adequate Speech Info: Adequate    SPECIAL CARE FACTORS FREQUENCY  PT (By licensed PT), OT (By licensed OT)     PT Frequency: 5x/week OT Frequency: 5x/week            Contractures Contractures Info: Not present    Additional Factors Info  Code Status, Allergies, Insulin Sliding Scale Code Status Info: Full Allergies Info: Oxycodone   Insulin Sliding Scale Info: see med list       Current Medications (09/30/2020):  This is the current hospital active medication list Current Facility-Administered Medications  Medication Dose Route Frequency Provider Last Rate Last Admin    stroke: mapping our early stages of recovery book   Does not apply Once Alcario Drought, Jared M, DO       acetaminophen (TYLENOL) tablet 650 mg  650 mg Oral Q4H PRN Etta Quill, DO   650 mg at 09/29/20 1646   Or   acetaminophen (TYLENOL) 160 MG/5ML solution 650 mg  650 mg Per Tube Q4H PRN Etta Quill, DO       Or   acetaminophen (TYLENOL) suppository 650 mg  650 mg Rectal Q4H PRN Etta Quill, DO       aspirin EC tablet 81 mg  81 mg Oral Daily Alcario Drought, Jared M, DO   81 mg at 09/30/20 1030   atorvastatin (LIPITOR) tablet 40 mg  40 mg Oral Daily Rosalin Hawking, MD  40 mg at 09/30/20 1030   enoxaparin (LOVENOX) injection 40 mg  40 mg Subcutaneous Q24H Pham, Minh Q, RPH-CPP   40 mg at 09/29/20 1524   furosemide (LASIX) tablet 20 mg  20 mg Oral Daily Charlynne Cousins, MD   20 mg at 09/30/20 1030   insulin aspart (novoLOG) injection 0-15 Units  0-15 Units Subcutaneous TID WC Charlynne Cousins, MD   2 Units at 09/29/20 1645   insulin aspart (novoLOG) injection 0-5 Units  0-5 Units Subcutaneous QHS Charlynne Cousins, MD       insulin aspart (novoLOG) injection 4 Units  4 Units Subcutaneous TID WC Charlynne Cousins, MD   4 Units at 09/29/20 1645   insulin  glargine (LANTUS) injection 15 Units  15 Units Subcutaneous BID Charlynne Cousins, MD   15 Units at 09/30/20 1030   loratadine (CLARITIN) tablet 10 mg  10 mg Oral QPM Etta Quill, DO   10 mg at 09/29/20 1646   melatonin tablet 3 mg  3 mg Oral QHS Charlynne Cousins, MD   3 mg at 09/29/20 2152   multivitamin with minerals tablet 1 tablet  1 tablet Oral Ezequiel Kayser M, DO   1 tablet at 09/30/20 0743   traMADol (ULTRAM) tablet 50 mg  50 mg Oral Q6H PRN Charlynne Cousins, MD   50 mg at 09/30/20 0113   triamcinolone (NASACORT) nasal inhaler 2 spray  2 spray Nasal Daily Etta Quill, DO   2 spray at 09/30/20 1030   tuberculin injection 5 Units  5 Units Intradermal Once Charlynne Cousins, MD         Discharge Medications: Please see discharge summary for a list of discharge medications.  Relevant Imaging Results:  Relevant Lab Results:   Additional Information SSN#: [id number] Moderna COVID-19 Vaccine 02/12/2020 , 06/21/2019 , 05/23/2019  Vu Liebman, LCSWA

## 2020-09-30 NOTE — Care Management Important Message (Signed)
Important Message  Patient Details  Name: Greg Robertson MRN: [id number] Date of Birth: Oct 25, 1950   Medicare Important Message Given:  Yes     Talha Iser [address] 09/30/2020, 12:01 PM

## 2020-09-30 NOTE — Discharge Summary (Signed)
Physician Discharge Summary  Greg Robertson OJJ:[id number] DOB: [birth year] DOA: 09/27/2020  PCP: Dettinger, Fransisca Kaufmann, MD  Admit date: 09/27/2020 Discharge date: 09/30/2020  Admitted From: Home(Home, ALF, ILF, SNF) Disposition:  Home  Recommendations for Outpatient Follow-up:  Follow up with neurology 4 weeks Please obtain BMP/CBC in one week You will need to follow-up with cardiology in 4 weeks we will need to be started on Entresto as an outpatient and will need an ischemic work-up.  Once cleared by neurology.   Home Health:No Equipment/Devices:None  Discharge Condition:Stable CODE STATUS:Full Diet recommendation: Heart Healthy   Brief/Interim Summary: 70 y.o. male past medical history of atrial fibrillation not on anticoagulation, poorly controlled diabetes mellitus type I former smoker who started having 1 day prior to admission of sudden onset left-sided vision which is persistent, in the ED CT of the head showed a right occipital stroke mild mass affect on the cistern and the occipital right lateral horn  Discharge Diagnoses:  Principal Problem:   Acute ischemic stroke ([place]) Active Problems:   DM type 1 (diabetes mellitus, type 1) ([place])   Acute on chronic diastolic CHF (congestive heart failure) (HCC)   Hypertension associated with diabetes ([place])   A-fib ([place])   AKI (acute kidney injury) ([place])   Left homonymous hemianopsia  Acute ischemic stroke [facility]): HgbA1c 8.7, fasting lipid panel LDL greater than 100 HDL g less than 40 he was started on Lipitor. MRI of the brain showed subacute hemorrhagic right PCA distribution infarct, there is a small volume blood in the occipital horns, with small remote left thalamic lacunar infarct PT recommended no PT follow-up.  Patient evaluated the patient recommended to follow-up with them as an outpatient. 2D echo showed an EF of 30 to 35% with global hypokinesia and grade 2 diastolic dysfunction, as the apex was not visualized we will  get a 2D echo with contrast to rule out apical thrombus. No events on telemetry. Neurology recommended to continue aspirin, due to the large size of his stroke hemorrhage they recommended no anticoagulation at this time. He will be discharged on aspirin. To repeat a CT scan of the head in 14 days if improvement and no frank blood can initiate Pradaxa at that time, not a candidate for Eliquis due to side effects.    DM type 1 (diabetes mellitus, type 1) (HCC) Worsen A1c, his long-acting insulin was increased he will continue sliding scale at home.   New Acute on chronic systolic and diastolic heart failure Lowest weight is around 77-78 kg which is what he weighs on admission. 2D echo done on 09/28/2020 showed an EF of 93% grade 2 diastolic heart failure apex was not visualized. Blood pressure is stabilizing, we will not start beta-blocker and Entresto at this time as it might worsen the stroke. He will definitely need an ischemic evaluation as an outpatient, will consult cardiology. He was started on low-dose Lasix before discharge we will start Coreg in 5 days follow-up with cardiology and titrate as needed.   Essential hypertension associated with diabetes ([place]) Held antihypertensive medications appears euvolemic physical exam.  Paroxysmal  A-fib (HCC) Sinus tachycardia continue aspirin no events on telemetry. Not a candidate for anticoagulation at this time due to his large subacute hemorrhagic infarct.  AKI (acute kidney injury) ([place]) Creatinine has returned to baseline we will start low-dose Lasix.        Discharge Instructions  Discharge Instructions     Ambulatory referral to Neurology   Complete by: As directed  Follow up with stroke clinic NP (Jessica Lemoyne or Cecille Rubin, if both not available, consider Zachery Dauer, or A[place]) at [facility] in about 4 weeks. Thanks.   Diet - low sodium heart healthy   Complete by: As directed    Increase activity slowly   Complete  by: As directed       Allergies as of 09/30/2020       Reactions   Oxycodone Nausea And Vomiting        Medication List     STOP taking these medications    lisinopril 10 MG tablet Commonly known as: Zestril   simvastatin 40 MG tablet Commonly known as: ZOCOR       TAKE these medications    acetaminophen 500 MG tablet Commonly known as: TYLENOL Take 1,000 mg by mouth every 6 (six) hours as needed for mild pain or headache. Pain   aspirin 81 MG EC tablet Take 1 tablet (81 mg total) by mouth daily. Swallow whole.   atorvastatin 40 MG tablet Commonly known as: LIPITOR Take 1 tablet (40 mg total) by mouth daily.   carvedilol 3.125 MG tablet Commonly known as: Coreg Take 1 tablet (3.125 mg total) by mouth 2 (two) times daily. Start taking on: October 05, 2020   FreeStyle Libre 2 Reader Kerrin Mo USE IN THE MORNING, AT NOON, IN THE EVENING, AND AT BEDTIME AS DIRECTED Dx E10.9   FreeStyle Libre 2 Sensor Misc 1 each by Does not apply route every 14 (fourteen) days   furosemide 20 MG tablet Commonly known as: LASIX Take 1 tablet (20 mg total) by mouth daily.   glucose blood test strip Use as instructed What changed: additional instructions   insulin aspart 100 UNIT/ML injection Commonly known as: NovoLOG INJECT 8-15 UNITS THREE TIMES DAILY BEFORE MEALS What changed:  how much to take how to take this when to take this additional instructions   levocetirizine 5 MG tablet Commonly known as: XYZAL Take 1 tablet (5 mg total) by mouth daily. What changed: when to take this   multivitamin tablet Take 1 tablet by mouth every morning.   Tyler Aas FlexTouch 100 UNIT/ML FlexTouch Pen Generic drug: insulin degludec Inject 30 Units into the skin every morning. What changed: how much to take   triamcinolone 55 MCG/ACT Aero nasal inhaler Commonly known as: NASACORT INSTILL 1 SPRAY IN EACH NOSTRIL ONCE OR TWICE DAILY AS NEEDED. What changed:  how much to take how to  take this when to take this additional instructions   VITAMIN C PO Take 1 tablet by mouth every morning.   Zioptan 0.0015 % Soln Generic drug: Tafluprost (PF) Place 1 drop into both eyes at bedtime.        Follow-up Information     [facility]. Schedule an appointment as soon as possible for a visit in 1 month(s).   Specialty: Neurology Why: stroke clinic Contact information: [place] [address] [phone]               Allergies  Allergen Reactions   Oxycodone Nausea And Vomiting    Consultations: Neurology   Procedures/Studies: CT Angio Head W or Wo Contrast  Result Date: 09/27/2020 CLINICAL DATA:  Change in vision EXAM: CT ANGIOGRAPHY HEAD AND NECK TECHNIQUE: Multidetector CT imaging of the head and neck was performed using the standard protocol during bolus administration of intravenous contrast. Multiplanar CT image reconstructions and MIPs were obtained to evaluate the vascular anatomy. Carotid stenosis measurements (  when applicable) are obtained utilizing NASCET criteria, using the distal internal carotid diameter as the denominator. CONTRAST:  44mL OMNIPAQUE IOHEXOL 350 MG/ML SOLN COMPARISON:  None. FINDINGS: CTA NECK Aortic arch: Great vessel origins are patent. Right carotid system: Patent. Noncalcified plaque along the common carotid with less than 50% stenosis. Calcified plaque along the proximal internal carotid with less than 50% stenosis. Left carotid system: Patent. Mixed plaque at the internal carotid origin with less than 50% stenosis. Vertebral arteries: Patent and codominant. Mild plaque at the left vertebral origin. Skeleton: No significant osseous abnormality. Other neck: Unremarkable. Upper chest: Bilateral pleural effusions. Review of the MIP images confirms the above findings CTA HEAD Anterior circulation: Intracranial internal carotid arteries are patent with calcified plaque causing mild  stenosis. Anterior and middle cerebral arteries are patent. Posterior circulation: Intracranial vertebral arteries are patent. Basilar artery is patent. Major cerebellar artery origins are patent. Left posterior cerebral artery is patent. No proximal right PCA occlusion. There is some right P3 and P4 irregularity with possible occlusion of a proximal P4 branch noted. Venous sinuses: Patent as allowed by contrast bolus timing. Review of the MIP images confirms the above findings IMPRESSION: There is no proximal intracranial vessel occlusion; specifically no proximal right PCA occlusion. Irregularity more distally with possible proximal P4 branch occlusion. No large vessel occlusion in the neck. Mild plaque at the left vertebral origin. Plaque at the ICA origins causes less than 50% stenosis. Bilateral pleural effusions. Electronically Signed   By: Macy Mis M.D.   On: 09/27/2020 19:56   DG Chest 2 View  Result Date: 09/29/2020 CLINICAL DATA:  Cough, weakness, dyspnea, code stroke EXAM: CHEST - 2 VIEW COMPARISON:  09/27/2020 FINDINGS: Normal heart size mediastinal contours. Bibasilar pleural effusions and atelectasis similar to prior study. Upper lungs clear. LEFT apex scarring stable. No new infiltrate or pneumothorax. Bones demineralized. IMPRESSION: Persistent bibasilar pleural effusions and atelectasis slightly greater on LEFT, unchanged. Electronically Signed   By: Lavonia Dana M.D.   On: 09/29/2020 13:38   DG Chest 2 View  Result Date: 09/27/2020 CLINICAL DATA:  Cough and shortness of breath. EXAM: CHEST - 2 VIEW COMPARISON:  Chest x-ray dated Sep 04, 2017. FINDINGS: The heart size and mediastinal contours are within normal limits. Pulmonary vascular congestion. Small bilateral pleural effusions and bibasilar atelectasis. No pneumothorax. No acute osseous abnormality. IMPRESSION: 1. Pulmonary vascular congestion and small bilateral pleural effusions. Electronically Signed   By: Titus Dubin M.D.    On: 09/27/2020 16:12   CT Head Wo Contrast  Result Date: 09/27/2020 CLINICAL DATA:  Acute stroke suspected. Headache. Left vision changes. EXAM: CT HEAD WITHOUT CONTRAST TECHNIQUE: Contiguous axial images were obtained from the base of the skull through the vertex without intravenous contrast. COMPARISON:  None. FINDINGS: Brain: No subdural, epidural, or subarachnoid hemorrhage. There is an infarct in the right occipital lobe. No other sites of cortical ischemia or infarct identified. The occipital horn of the right lateral ventricle is compressed due to mass effect from the infarct. The ventricles are otherwise normal. No midline shift. Basal cisterns are patent although there is mild narrowing of the quadrigeminal plate cistern on the right due to the infarct. The cerebellum and brainstem are normal. Vascular: Calcified atherosclerosis is seen in the intracranial carotids. Skull: Normal. Negative for fracture or focal lesion. Sinuses/Orbits: No acute finding. Other: None. IMPRESSION: 1. Right occipital lobe infarct consistent with the patient's symptoms. The infarct exerts mild mass effect on the right side of the quadrigeminal  plate cistern and significant mass effect on the occipital horn of the right lateral ventricle. There is no midline shift however. There is likely punctate hemorrhage scattered throughout the infarct. No large hematoma. 2. No other abnormalities. Electronically Signed   By: Dorise Bullion III M.D   On: 09/27/2020 19:38   CT Angio Neck W and/or Wo Contrast  Result Date: 09/27/2020 CLINICAL DATA:  Change in vision EXAM: CT ANGIOGRAPHY HEAD AND NECK TECHNIQUE: Multidetector CT imaging of the head and neck was performed using the standard protocol during bolus administration of intravenous contrast. Multiplanar CT image reconstructions and MIPs were obtained to evaluate the vascular anatomy. Carotid stenosis measurements (when applicable) are obtained utilizing NASCET criteria, using  the distal internal carotid diameter as the denominator. CONTRAST:  30mL OMNIPAQUE IOHEXOL 350 MG/ML SOLN COMPARISON:  None. FINDINGS: CTA NECK Aortic arch: Great vessel origins are patent. Right carotid system: Patent. Noncalcified plaque along the common carotid with less than 50% stenosis. Calcified plaque along the proximal internal carotid with less than 50% stenosis. Left carotid system: Patent. Mixed plaque at the internal carotid origin with less than 50% stenosis. Vertebral arteries: Patent and codominant. Mild plaque at the left vertebral origin. Skeleton: No significant osseous abnormality. Other neck: Unremarkable. Upper chest: Bilateral pleural effusions. Review of the MIP images confirms the above findings CTA HEAD Anterior circulation: Intracranial internal carotid arteries are patent with calcified plaque causing mild stenosis. Anterior and middle cerebral arteries are patent. Posterior circulation: Intracranial vertebral arteries are patent. Basilar artery is patent. Major cerebellar artery origins are patent. Left posterior cerebral artery is patent. No proximal right PCA occlusion. There is some right P3 and P4 irregularity with possible occlusion of a proximal P4 branch noted. Venous sinuses: Patent as allowed by contrast bolus timing. Review of the MIP images confirms the above findings IMPRESSION: There is no proximal intracranial vessel occlusion; specifically no proximal right PCA occlusion. Irregularity more distally with possible proximal P4 branch occlusion. No large vessel occlusion in the neck. Mild plaque at the left vertebral origin. Plaque at the ICA origins causes less than 50% stenosis. Bilateral pleural effusions. Electronically Signed   By: Macy Mis M.D.   On: 09/27/2020 19:56   MR BRAIN WO CONTRAST  Result Date: 09/28/2020 CLINICAL DATA:  Initial evaluation for acute stroke, visual disturbance. EXAM: MRI HEAD WITHOUT CONTRAST TECHNIQUE: Multiplanar, multiecho pulse  sequences of the brain and surrounding structures were obtained without intravenous contrast. COMPARISON:  Prior CTs from earlier the same day. FINDINGS: Brain: Cerebral volume within normal limits for age. Small remote lacunar infarct at the left thalamus. No other significant small vessel disease for age. Moderately large evolving acute to early subacute right PCA distribution infarct involving the right temporal occipital region is seen, corresponding with abnormality on prior CT (series 5, image 70). Extensive susceptibility artifact seen throughout the area of infarction, consistent with associated hemorrhage. No frank intraparenchymal hematoma. Trace blood products seen layering within the occipital horns of both lateral ventricles, likely related to redistribution of small volume associated subarachnoid blood (series 14, image 28). No significant midline shift or regional mass effect. No hydrocephalus or trapping. No other evidence for acute or subacute ischemia. Gray-white matter differentiation otherwise maintained. No other areas of remote cortical infarction. No other evidence for acute or chronic intracranial hemorrhage. No mass lesion, midline shift or mass effect. No extra-axial fluid collection. Pituitary gland suprasellar region normal. Midline structures intact. Vascular: Major intracranial vascular flow voids are maintained. Skull and upper  cervical spine: Craniocervical junction within normal limits. Bone marrow signal intensity normal. No scalp soft tissue abnormality. Sinuses/Orbits: Globes and orbital soft tissues within normal limits. Mild scattered mucosal thickening noted within the ethmoidal air cells and maxillary sinuses. Paranasal sinuses are otherwise clear. No significant mastoid effusion. Inner ear structures grossly normal. Other: None. IMPRESSION: 1. Moderately large evolving acute to early subacute hemorrhagic right PCA distribution infarct. No significant mass effect. 2. Trace  blood products layering within the occipital horns of both lateral ventricles, likely related to redistribution of small volume associated subarachnoid blood. No hydrocephalus or trapping. 3. Small remote left thalamic lacunar infarct. Electronically Signed   By: Jeannine Boga M.D.   On: 09/28/2020 00:45   ECHOCARDIOGRAM COMPLETE  Result Date: 09/28/2020    ECHOCARDIOGRAM REPORT   Patient Name:   SABURO LUGER Date of Exam: 09/28/2020 Medical Rec #:  [id number]          Height:       75.0 in Accession #:    [id number]         Weight:       172.0 lb Date of Birth:  [birth year]          BSA:          2.058 m Patient Age:    54 years           BP:           126/82 mmHg Patient Gender: M                  HR:           95 bpm. Exam Location:  Inpatient Procedure: 2D Echo, Cardiac Doppler and Color Doppler Indications:    Stroke  History:        Patient has prior history of Echocardiogram examinations, most                 recent 03/11/2012. Risk Factors:Former Smoker and Diabetes.  Sonographer:    Cammy Brochure Referring Phys: [place]  1. Left ventricular ejection fraction, by estimation, is 30 to 35%. The left ventricle has moderately decreased function. The left ventricle demonstrates global hypokinesis. Left ventricular diastolic parameters are consistent with Grade II diastolic dysfunction (pseudonormalization). Elevated left atrial pressure.  2. LV apex not well visualized. In setting of CVA with LV systolic dysfunction, would consider repeat limited echo with contrast to rule out apical thrombus  3. Right ventricular systolic function is normal. The right ventricular size is normal. Tricuspid regurgitation signal is inadequate for assessing PA pressure.  4. The mitral valve is normal in structure. No evidence of mitral valve regurgitation. No evidence of mitral stenosis.  5. The aortic valve was not well visualized. Aortic valve regurgitation is not visualized. No aortic  stenosis is present.  6. The inferior vena cava is normal in size with greater than 50% respiratory variability, suggesting right atrial pressure of 3 mmHg. FINDINGS  Left Ventricle: Left ventricular ejection fraction, by estimation, is 30 to 35%. The left ventricle has moderately decreased function. The left ventricle demonstrates global hypokinesis. The left ventricular internal cavity size was normal in size. There is no left ventricular hypertrophy. Left ventricular diastolic parameters are consistent with Grade II diastolic dysfunction (pseudonormalization). Elevated left atrial pressure. Right Ventricle: The right ventricular size is normal. No increase in right ventricular wall thickness. Right ventricular systolic function is normal. Tricuspid regurgitation signal is inadequate for assessing PA pressure. Left  Atrium: Left atrial size was normal in size. Right Atrium: Right atrial size was normal in size. Pericardium: There is no evidence of pericardial effusion. Mitral Valve: The mitral valve is normal in structure. No evidence of mitral valve regurgitation. No evidence of mitral valve stenosis. Tricuspid Valve: The tricuspid valve is normal in structure. Tricuspid valve regurgitation is not demonstrated. Aortic Valve: The aortic valve was not well visualized. Aortic valve regurgitation is not visualized. No aortic stenosis is present. Aortic valve mean gradient measures 2.5 mmHg. Aortic valve peak gradient measures 4.0 mmHg. Aortic valve area, by VTI measures 2.10 cm. Pulmonic Valve: The pulmonic valve was not well visualized. Pulmonic valve regurgitation is not visualized. Aorta: The aortic root and ascending aorta are structurally normal, with no evidence of dilitation. Venous: The inferior vena cava is normal in size with greater than 50% respiratory variability, suggesting right atrial pressure of 3 mmHg. IAS/Shunts: The interatrial septum was not well visualized. Additional Comments: There is a small  pleural effusion in the left lateral region.  LEFT VENTRICLE PLAX 2D LVIDd:         5.30 cm  Diastology LVIDs:         3.80 cm  LV e' medial:    5.98 cm/s LV PW:         1.20 cm  LV E/e' medial:  16.3 LV IVS:        0.90 cm  LV e' lateral:   8.38 cm/s LVOT diam:     2.15 cm  LV E/e' lateral: 11.6 LV SV:         39 LV SV Index:   19 LVOT Area:     3.63 cm  RIGHT VENTRICLE             IVC RV Basal diam:  3.60 cm     IVC diam: 1.60 cm RV S prime:     11.10 cm/s TAPSE (M-mode): 1.8 cm LEFT ATRIUM             Index       RIGHT ATRIUM           Index LA diam:        3.60 cm 1.75 cm/m  RA Area:     15.10 cm LA Vol (A2C):   60.1 ml 29.20 ml/m RA Volume:   46.00 ml  22.35 ml/m LA Vol (A4C):   56.6 ml 27.50 ml/m LA Biplane Vol: 59.3 ml 28.81 ml/m  AORTIC VALVE AV Area (Vmax):    2.28 cm AV Area (Vmean):   2.01 cm AV Area (VTI):     2.10 cm AV Vmax:           100.45 cm/s AV Vmean:          75.450 cm/s AV VTI:            0.186 m AV Peak Grad:      4.0 mmHg AV Mean Grad:      2.5 mmHg LVOT Vmax:         63.10 cm/s LVOT Vmean:        41.800 cm/s LVOT VTI:          0.108 m LVOT/AV VTI ratio: 0.58  AORTA Ao Root diam: 3.05 cm Ao Asc diam:  2.65 cm MITRAL VALVE MV Area (PHT): 7.44 cm    SHUNTS MV Decel Time: 102 msec    Systemic VTI:  0.11 m MV E velocity: 97.30 cm/s  Systemic Diam: 2.15 cm MV  A velocity: 42.80 cm/s MV E/A ratio:  2.27 Oswaldo Milian MD Electronically signed by Oswaldo Milian MD Signature Date/Time: 09/28/2020/11:17:35 AM    Final    (Echo, Carotid, EGD, Colonoscopy, ERCP)    Subjective: No complaints.  Discharge Exam: Vitals:   09/30/20 0347 09/30/20 0818  BP: 117/70 114/81  Pulse: 84 96  Resp: 18 18  Temp: 98.1 F (36.7 C) 97.7 F (36.5 C)  SpO2: 95% 98%   Vitals:   09/29/20 1935 09/30/20 0106 09/30/20 0347 09/30/20 0818  BP: 111/71 123/83 117/70 114/81  Pulse: 85 93 84 96  Resp: 18 18 18 18   Temp: 98.3 F (36.8 C) 98.8 F (37.1 C) 98.1 F (36.7 C) 97.7 F (36.5 C)   TempSrc: Oral Oral Oral Oral  SpO2: 98% 96% 95% 98%  Weight:      Height:        General: Pt is alert, awake, not in acute distress Cardiovascular: RRR, S1/S2 +, no rubs, no gallops Respiratory: CTA bilaterally, no wheezing, no rhonchi Abdominal: Soft, NT, ND, bowel sounds + Extremities: no edema, no cyanosis    The results of significant diagnostics from this hospitalization (including imaging, microbiology, ancillary and laboratory) are listed below for reference.     Microbiology: Recent Results (from the past 240 hour(s))  Resp Panel by RT-PCR (Flu A&B, Covid) Nasopharyngeal Swab     Status: None   Collection Time: 09/27/20  9:05 PM   Specimen: Nasopharyngeal Swab; Nasopharyngeal(NP) swabs in vial transport medium  Result Value Ref Range Status   SARS Coronavirus 2 by RT PCR NEGATIVE NEGATIVE Final    Comment: (NOTE) SARS-CoV-2 target nucleic acids are NOT DETECTED.  The SARS-CoV-2 RNA is generally detectable in upper respiratory specimens during the acute phase of infection. The lowest concentration of SARS-CoV-2 viral copies this assay can detect is 138 copies/mL. A negative result does not preclude SARS-Cov-2 infection and should not be used as the sole basis for treatment or other patient management decisions. A negative result may occur with  improper specimen collection/handling, submission of specimen other than nasopharyngeal swab, presence of viral mutation(s) within the areas targeted by this assay, and inadequate number of viral copies(<138 copies/mL). A negative result must be combined with clinical observations, patient history, and epidemiological information. The expected result is Negative.  Fact Sheet for Patients:  EntrepreneurPulse.com.au  Fact Sheet for Healthcare Providers:  IncredibleEmployment.be  This test is no t yet approved or cleared by the [place] FDA and  has been authorized for detection  and/or diagnosis of SARS-CoV-2 by FDA under an Emergency Use Authorization (EUA). This EUA will remain  in effect (meaning this test can be used) for the duration of the COVID-19 declaration under Section 564(b)(1) of the Act, 21 U.S.C.section 360bbb-3(b)(1), unless the authorization is terminated  or revoked sooner.       Influenza A by PCR NEGATIVE NEGATIVE Final   Influenza B by PCR NEGATIVE NEGATIVE Final    Comment: (NOTE) The Xpert Xpress SARS-CoV-2/FLU/RSV plus assay is intended as an aid in the diagnosis of influenza from Nasopharyngeal swab specimens and should not be used as a sole basis for treatment. Nasal washings and aspirates are unacceptable for Xpert Xpress SARS-CoV-2/FLU/RSV testing.  Fact Sheet for Patients: EntrepreneurPulse.com.au  Fact Sheet for Healthcare Providers: IncredibleEmployment.be  This test is not yet approved or cleared by the [place] FDA and has been authorized for detection and/or diagnosis of SARS-CoV-2 by FDA under an Emergency Use Authorization (EUA). This EUA  will remain in effect (meaning this test can be used) for the duration of the COVID-19 declaration under Section 564(b)(1) of the Act, 21 U.S.C. section 360bbb-3(b)(1), unless the authorization is terminated or revoked.  Performed at [facility], [place] [address], [place], [place] [address]      Labs: BNP (last 3 results) Recent Labs    09/27/20 1905 09/29/20 1643  BNP 1,566.5* 5,732.2*   Basic Metabolic Panel: Recent Labs  Lab 09/27/20 1559 09/27/20 2115 09/28/20 0732 09/29/20 0546 09/30/20 0339  NA 132* 134* 134* 131* 135  K 5.2* 3.9 4.2 4.2 3.8  CL 103  --  102 102 101  CO2 19*  --  24 20* 21*  GLUCOSE 435*  --  123* 251* 66*  BUN 25*  --  19 18 17   CREATININE 1.63*  --  1.45* 1.34* 1.20  CALCIUM 9.1  --  8.9 8.6* 8.8*   Liver Function Tests: No results for input(s): AST, ALT, ALKPHOS, BILITOT, PROT, ALBUMIN in  the last 168 hours. No results for input(s): LIPASE, AMYLASE in the last 168 hours. No results for input(s): AMMONIA in the last 168 hours. CBC: Recent Labs  Lab 09/27/20 1559 09/27/20 2115  WBC 8.6  --   HGB 14.3 17.0  HCT 43.5 50.0  MCV 92.9  --   PLT 148*  --    Cardiac Enzymes: No results for input(s): CKTOTAL, CKMB, CKMBINDEX, TROPONINI in the last 168 hours. BNP: Invalid input(s): POCBNP CBG: Recent Labs  Lab 09/29/20 1226 09/29/20 1538 09/29/20 2149 09/30/20 0548 09/30/20 0639  GLUCAP 90 141* 103* 60* 132*   D-Dimer No results for input(s): DDIMER in the last 72 hours. Hgb A1c Recent Labs    09/28/20 0732  HGBA1C 8.7*   Lipid Profile Recent Labs    09/28/20 0732  CHOL 201*  HDL 62  LDLCALC 128*  TRIG 55  CHOLHDL 3.2   Thyroid function studies No results for input(s): TSH, T4TOTAL, T3FREE, THYROIDAB in the last 72 hours.  Invalid input(s): FREET3 Anemia work up No results for input(s): VITAMINB12, FOLATE, FERRITIN, TIBC, IRON, RETICCTPCT in the last 72 hours. Urinalysis    Component Value Date/Time   COLORURINE STRAW (A) 09/27/2020 1548   APPEARANCEUR CLEAR 09/27/2020 1548   APPEARANCEUR Clear 09/04/2017 1304   LABSPEC 1.031 (H) 09/27/2020 1548   PHURINE 5.0 09/27/2020 1548   GLUCOSEU >=500 (A) 09/27/2020 1548   HGBUR NEGATIVE 09/27/2020 1548   BILIRUBINUR NEGATIVE 09/27/2020 1548   BILIRUBINUR Negative 09/04/2017 1304   KETONESUR 20 (A) 09/27/2020 1548   PROTEINUR NEGATIVE 09/27/2020 1548   UROBILINOGEN negative 01/21/2015 1104   UROBILINOGEN 0.2 04/06/2013 2058   NITRITE NEGATIVE 09/27/2020 1548   LEUKOCYTESUR NEGATIVE 09/27/2020 1548   Sepsis Labs Invalid input(s): PROCALCITONIN,  WBC,  LACTICIDVEN Microbiology Recent Results (from the past 240 hour(s))  Resp Panel by RT-PCR (Flu A&B, Covid) Nasopharyngeal Swab     Status: None   Collection Time: 09/27/20  9:05 PM   Specimen: Nasopharyngeal Swab; Nasopharyngeal(NP) swabs in vial  transport medium  Result Value Ref Range Status   SARS Coronavirus 2 by RT PCR NEGATIVE NEGATIVE Final    Comment: (NOTE) SARS-CoV-2 target nucleic acids are NOT DETECTED.  The SARS-CoV-2 RNA is generally detectable in upper respiratory specimens during the acute phase of infection. The lowest concentration of SARS-CoV-2 viral copies this assay can detect is 138 copies/mL. A negative result does not preclude SARS-Cov-2 infection and should not be used as the  sole basis for treatment or other patient management decisions. A negative result may occur with  improper specimen collection/handling, submission of specimen other than nasopharyngeal swab, presence of viral mutation(s) within the areas targeted by this assay, and inadequate number of viral copies(<138 copies/mL). A negative result must be combined with clinical observations, patient history, and epidemiological information. The expected result is Negative.  Fact Sheet for Patients:  EntrepreneurPulse.com.au  Fact Sheet for Healthcare Providers:  IncredibleEmployment.be  This test is no t yet approved or cleared by the [place] FDA and  has been authorized for detection and/or diagnosis of SARS-CoV-2 by FDA under an Emergency Use Authorization (EUA). This EUA will remain  in effect (meaning this test can be used) for the duration of the COVID-19 declaration under Section 564(b)(1) of the Act, 21 U.S.C.section 360bbb-3(b)(1), unless the authorization is terminated  or revoked sooner.       Influenza A by PCR NEGATIVE NEGATIVE Final   Influenza B by PCR NEGATIVE NEGATIVE Final    Comment: (NOTE) The Xpert Xpress SARS-CoV-2/FLU/RSV plus assay is intended as an aid in the diagnosis of influenza from Nasopharyngeal swab specimens and should not be used as a sole basis for treatment. Nasal washings and aspirates are unacceptable for Xpert Xpress SARS-CoV-2/FLU/RSV testing.  Fact  Sheet for Patients: EntrepreneurPulse.com.au  Fact Sheet for Healthcare Providers: IncredibleEmployment.be  This test is not yet approved or cleared by the [place] FDA and has been authorized for detection and/or diagnosis of SARS-CoV-2 by FDA under an Emergency Use Authorization (EUA). This EUA will remain in effect (meaning this test can be used) for the duration of the COVID-19 declaration under Section 564(b)(1) of the Act, 21 U.S.C. section 360bbb-3(b)(1), unless the authorization is terminated or revoked.  Performed at [facility], [place] [address] [place], [place] [address]      Time coordinating discharge: Over 30 minutes  SIGNED:   Charlynne Cousins, MD  [facility] 09/30/2020, 9:51 AM Pager   If 7PM-7AM, please contact night-coverage [email or web address] Password [email or web address]

## 2020-09-30 NOTE — Progress Notes (Signed)
Reassessment of pt's CBG is 132. Will notify day shift for further monitoring.

## 2020-09-30 NOTE — Progress Notes (Signed)
Heart Failure Stewardship Pharmacist Progress Note   PCP: Dettinger, Fransisca Kaufmann, MD PCP-Cardiologist: None    HPI:  70 yo M with PMH of afib, aortic atherosclerosis, HTN, HLD, T1DM, CVA, colon cancer s/p R colectomy and FOLFOX (2013). He was admitted on 6/12 for acute CVA. An ECHO was done on 6/13 and he was found to have LVEF 30-35% and normal RV function.   Current HF Medications: Furosemide 20 mg daily  Prior to admission HF Medications: Lisinopril 10 mg daily  Pertinent Lab Values: Serum creatinine 1.20, BUN 17, Potassium 3.8, Sodium 135, BNP 1317.8  Vital Signs: Weight: 172 lbs (admission weight: 172 lbs) - not collecting daily weights Blood pressure: 110/80s  Heart rate: 80s   Medication Assistance / Insurance Benefits Check: Does the patient have prescription insurance?  Yes Type of insurance plan: Medicare + BCBS supplement   Outpatient Pharmacy:  Prior to admission [facility] Is the patient willing to use [facility] at discharge? Yes Is the patient willing to transition their outpatient pharmacy to utilize a [facility] outpatient pharmacy?   Pending    Assessment: 1. Acute systolic CHF (EF 71-21%). NYHA class II symptoms. - Continue furosemide 20 mg daily - Consider starting metoprolol XL vs low dose carvedilol today - Permissive HTN x 48 hours post stroke (presented for stroke on 6/12 @1500 ) >> completed - Consider starting Entresto 24/26 mg BID or spironolactone 12.5 mg daily now that he has completed 48 hours of permissive HTN - No SGLT2i with T1DM   Plan: 1) Medication changes recommended at this time: - Start carvedilol 3.125 mg BID today  2) Patient assistance: - None pending - HF TOC appt made for 6/22   Kerby Nora, PharmD, BCPS Heart Failure Stewardship Pharmacist Phone [phone]

## 2020-09-30 NOTE — TOC Progression Note (Addendum)
Transition of Care (TOC) - Progression Note  Heart Failure   Patient Details  Name: Greg Robertson MRN: [id number] Date of Birth: 03-12-1951  Transition of Care [facility]) CM/SW [place], [place] Phone Number: 09/30/2020, 12:26 PM  Clinical Narrative:    CSW received consult for possible SNF placement at time of discharge. CSW spoke with patient. Patient reported that he lives alone and his sister and her husband are unable/unwilling to care for patient at their home given patient's current physical needs and fall risk. Patient expressed understanding of PT/OT recommendation and is agreeable to SNF placement at time of discharge. Patient reports preference for a facility in [place] so that his sister will hopefully visit him. CSW discussed insurance authorization process and provided Medicare SNF ratings list. Patient has received the COVID vaccines. Patient expressed being hopeful for rehab and to feel better soon. No further questions reported at this time.    12:45pm - CSW presented the SNF bed offers to Mr. Mcevers who was unfamiliar with the [facility] and reported he will need some time to make a decision. CSW reached out to patients sister Kathrene Bongo [phone] and provided her with the SNF bed offers and the Medicare SNF rating list and she reported her and her brother will make a decision and let the CSW know. CSW informed Manuela Schwartz that her brother is medically ready for discharge and she and her husband reported they are aware.  CSW will continue to follow throughout discharge.    Expected Discharge Plan: Skilled Nursing Facility Barriers to Discharge: Unsafe home situation, SNF Pending bed offer, Family Issues  Expected Discharge Plan and Services Expected Discharge Plan: [place] In-house Referral: Clinical Social Work Discharge Planning Services: CM Consult Post Acute Care Choice: [place] arrangements for the past 2  months: Single Family Home Expected Discharge Date: 09/30/20                                     Social Determinants of Health (SDOH) Interventions    Readmission Risk Interventions No flowsheet data found.  Lucillie Kiesel, MSW, [place] Heart Failure Social Worker

## 2020-09-30 NOTE — Progress Notes (Signed)
Pt CBG was 60 @ morning check. Pt was given 8 oz orange juice to drink. Will reassess CBG for change, and will continue to monitor.

## 2020-09-30 NOTE — Progress Notes (Signed)
After patient has worked with OT today the feeling is he is going to need some assistance after a rehab stay. CM inquired about talking to [facility] to assist in finding an ALF after a rehab stay until the patient is able to return to home. Pt in agreement and so is his sister at the bedside. Colletta Maryland with [facility] will reach out to assist in the transition from rehab to ALF.

## 2020-09-30 NOTE — Evaluation (Signed)
Physical Therapy Evaluation Patient Details Name: Greg Robertson MRN: [id number] DOB: 1950/05/06 Today's Date: 09/30/2020   History of Present Illness  Pt is 70 yo male who had onset of visual changes 1 day prior to admission. CT showed R occipital CVA. PMH: Afib, poorly controlled DM1. former smoker, HTN, R eye deficits  Clinical Impression  Pt demonstrates ability to perform transfers and ambulate without requiring physical assistance. During dynamic gait activities, pt experiences 2 LOB and is able to self-correct. Pt collides with objects hanging from the wall on the L during scanning activities during session. Pt requires cues at times to scan further to the L, posing a safety risk with navigating through traffic while walking in the community. Pt will benefit from continued acute PT to improve safety during functional mobility and decrease risk of falls. SPT recommends SNF placement to improve safety and functional mobility and to aid in return to prior level of function.     Follow Up Recommendations SNF    Equipment Recommendations  None recommended by PT    Recommendations for Other Services       Precautions / Restrictions Precautions Precautions: Fall Precaution Comments: L homonymous hemianopsia on top of previous visual deficits R eye Restrictions Weight Bearing Restrictions: No      Mobility  Bed Mobility Overal bed mobility: Independent             General bed mobility comments: Pt received sitting at EOB    Transfers Overall transfer level: Independent                  Ambulation/Gait Ambulation/Gait assistance: Supervision;Min guard Gait Distance (Feet):  (household and limited community distances) Assistive device: None Gait Pattern/deviations: Decreased stride length Gait velocity: reduced Gait velocity interpretation: 1.31 - 2.62 ft/sec, indicative of limited community ambulator General Gait Details: Pt tolerates dynamic gait activities  and experiences 2 LOB, able to self-correct. When asked to scan R and L, pt tends to drift to the L and collides with obstacles hanging from wall.  Stairs            Wheelchair Mobility    Modified Rankin (Stroke Patients Only) Modified Rankin (Stroke Patients Only) Pre-Morbid Rankin Score: No symptoms Modified Rankin: Slight disability     Balance Overall balance assessment: Needs assistance Sitting-balance support: Feet supported Sitting balance-Leahy Scale: Good     Standing balance support: No upper extremity supported;During functional activity Standing balance-Leahy Scale: Fair Standing balance comment: Pt tolerates static standing without reliance of UE support.                             Pertinent Vitals/Pain Pain Assessment: No/denies pain    Home Living Family/patient expects to be discharged to:: Private residence Living Arrangements: Alone Available Help at Discharge: Family;Available PRN/intermittently Type of Home: House Home Access: Stairs to enter Entrance Stairs-Rails: None Entrance Stairs-Number of Steps: 2 Home Layout: Two level;Able to live on main level with bedroom/bathroom Home Equipment: Shower seat      Prior Function Level of Independence: Independent         Comments: Pt drives at baseline and performs all ADLs independently. Pt ambulates to grocery store.     Hand Dominance        Extremity/Trunk Assessment   Upper Extremity Assessment Upper Extremity Assessment: Overall WFL for tasks assessed    Lower Extremity Assessment Lower Extremity Assessment: Overall WFL for tasks assessed  Cervical / Trunk Assessment Cervical / Trunk Assessment: Kyphotic  Communication   Communication: No difficulties  Cognition Arousal/Alertness: Awake/alert Behavior During Therapy: WFL for tasks assessed/performed Overall Cognitive Status: Impaired/Different from baseline Area of Impairment: Awareness;Safety/judgement                          Safety/Judgement: Decreased awareness of deficits Awareness: Emergent   General Comments: Pt reports LOB noted during session is due to his shoes. When pt is asked about walking into objects hanging from wall on his L, he reports not realizing having done this.      General Comments General comments (skin integrity, edema, etc.): Pt initially on 1 L [place] satting at 97%. Pt weaned to RA and maintaining oxygen sat levels at or above 92% during mobility. Pt returns to 96% and above on RA at rest.    Exercises Other Exercises Other Exercises: sister present and upset with pt discharge and recommendations.  Long discussion with she and pt re: his deficits and their impact on function, and why he will need SNF, then possibly ALF, vs increased supports at home upon discharge.  long discussion re: risks of community ambulation and tasks such as grocery shopping with a homonymous hemianopsia Other Exercises: Pt stood to exit room, and was talking with therapist and his sister.  he proceeded to run into the beside table that had been to his left, then tripped over the leg of the bedside table.  He stumbled into the sink vanity, and caught himself with his Lt UE, but OT had to provide mod A to prevent him from fully falling.  Pt then worked on visual scanning task in the hallways - ambulating while reading room numbers.  His speed of ambulation decreased significantly, and he required mod verbal cues to scan Lt and Rt and had mild LOB with task.   Assessment/Plan    PT Assessment Patient needs continued PT services  PT Problem List Decreased balance;Decreased safety awareness;Decreased knowledge of precautions       PT Treatment Interventions Gait training;Stair training;Functional mobility training;Therapeutic activities;Therapeutic exercise;Balance training;Patient/family education    PT Goals (Current goals can be found in the Care Plan section)  Acute Rehab PT  Goals Patient Stated Goal: Improve safety during mobility and return home. PT Goal Formulation: With patient Time For Goal Achievement: 10/14/20 Potential to Achieve Goals: Good Additional Goals Additional Goal #1: Pt will identify predetermined objects on his L 5/5 times and consistently scan his environment to his L to improve safety and awareness with functional mobility.    Frequency Min 3X/week   Barriers to discharge        Co-evaluation               AM-PAC PT "6 Clicks" Mobility  Outcome Measure Help needed turning from your back to your side while in a flat bed without using bedrails?: None Help needed moving from lying on your back to sitting on the side of a flat bed without using bedrails?: None Help needed moving to and from a bed to a chair (including a wheelchair)?: None Help needed standing up from a chair using your arms (e.g., wheelchair or bedside chair)?: None Help needed to walk in hospital room?: A Little Help needed climbing 3-5 steps with a railing? : A Little 6 Click Score: 22    End of Session Equipment Utilized During Treatment: Gait belt Activity Tolerance: Patient tolerated treatment well Patient left: in  bed;with call bell/phone within reach Nurse Communication: Mobility status PT Visit Diagnosis: Unsteadiness on feet (R26.81);Other abnormalities of gait and mobility (R26.89)    Time: 2197-5883 PT Time Calculation (min) (ACUTE ONLY): 27 min   Charges:   PT Evaluation $PT Eval Low Complexity: 1 Low          Acute Rehab  Pager: [phone]   Garwin Brothers, SPT  09/30/2020, 4:20 PM

## 2020-09-30 NOTE — Progress Notes (Signed)
Occupational Therapy Treatment Patient Details Name: Greg Robertson MRN: [id number] DOB: 31-Jul-1950 Today's Date: 09/30/2020    History of present illness Pt is 70 yo male who had onset of visual changes 1 day prior to admission. CT showed R occipital CVA. PMH: Afib, poorly controlled DM1. former smoker, HTN, R eye deficits   OT comments  Pt demonstrates decreased awareness of his deficits and impact on function.   He currently is able to read, but requires a significant amount of time to do so, and requires >30 mins to read his food tray list to calculate his carbs which is not functional reading.  He Reports he is unable to go to his sister's at discharge, and will be alone at home.  His plan is to walk to the grocery store which includes crossing streets.  Long discussion with him re: safety risks associated with this and the speed and efficiency of visual scanning that he would have to perform in order to do this task safely.  Long discussion with him that this may take weeks, and more likely months to build this level of skill.   He also is unable to read the numbers on a syringe accurately to draw up and administer his insulin.  He will extensive visual rehab to learn adequate compensatory strategies to live independently.  Recommend SNF level rehab at discharge.   Follow Up Recommendations  SNF    Equipment Recommendations  None recommended by OT    Recommendations for Other Services      Precautions / Restrictions Precautions Precautions: Fall Precaution Comments: L homonymous hemianopsia on top of previous visual deficits R eye       Mobility Bed Mobility Overal bed mobility: Independent                  Transfers                      Balance                                           ADL either performed or assessed with clinical judgement   ADL                                               Vision   Visual  Fields: Left homonymous hemianopsia   Perception     Praxis      Cognition Arousal/Alertness: Awake/alert Behavior During Therapy: WFL for tasks assessed/performed Overall Cognitive Status: Impaired/Different from baseline Area of Impairment: Awareness;Safety/judgement                         Safety/Judgement: Decreased awareness of deficits Awareness: Emergent   General Comments: Pt demontrates decreased awareness of how visual deficits will impact him functionally.  He feels that if he can have therapy for a few days and learns to read that he will be able to live independently.  Very long discussion with him re: his deficits, their impact on funciton, risks with community ambulation and skills, etc        Exercises Other Exercises Other Exercises: Worked with pt on use of line guide and reading.  Worked on scanning his meal ticket and identifying  food items and corresponding carbohydrate grams.  He requires a significant amount of time to read.  Increasing the room lighting improved his ability to perform the task.  he was eventually able to complete the task and calculate the appropriate amount of insulin that he would need Other Exercises: Long discussion with pt re: options for managing and monitoring DM.  Spoke with CM who re consulted diabetes coordinator as he is unable to see functionally to perform an accurate insulin draw in a syringe.   Pt reports he is unable to go to his sister's home in [place], and has limited supports in [place].  He does not have internet access, and does not have a flip phone.  Long discussion with him re: need for supervision initially, and then direct assist/supervision with medication management, financial management, community skills such as grocery shopping, ambulating in the community, crossing streets   Shoulder Instructions       General Comments      Pertinent Vitals/ Pain       Pain Assessment: No/denies pain  Home Living                                           Prior Functioning/Environment              Frequency  Min 3X/week        Progress Toward Goals  OT Goals(current goals can now be found in the care plan section)  Progress towards OT goals: Progressing toward goals (Pt requires supervision for set up of ADLs)     Plan Discharge plan needs to be updated    Co-evaluation                 AM-PAC OT "6 Clicks" Daily Activity     Outcome Measure   Help from another person eating meals?: A Little Help from another person taking care of personal grooming?: A Little Help from another person toileting, which includes using toliet, bedpan, or urinal?: A Little Help from another person bathing (including washing, rinsing, drying)?: A Little Help from another person to put on and taking off regular upper body clothing?: A Little Help from another person to put on and taking off regular lower body clothing?: A Little 6 Click Score: 18    End of Session    OT Visit Diagnosis: Low vision, both eyes (H54.2)   Activity Tolerance Patient tolerated treatment well   Patient Left in bed;with call bell/phone within reach (EOB)   Nurse Communication Mobility status        Time: 6945-0388 OT Time Calculation (min): 90 min  Charges: OT General Charges $OT Visit: 1 Visit OT Treatments $Therapeutic Activity: 83-97 mins  Nilsa Nutting., OTR/L Acute Rehabilitation Services Pager [phone] Office [phone]    Lucille Passy M 09/30/2020, 3:47 PM

## 2020-10-01 ENCOUNTER — Other Ambulatory Visit (HOSPITAL_COMMUNITY): Payer: Self-pay

## 2020-10-01 DIAGNOSIS — Z794 Long term (current) use of insulin: Secondary | ICD-10-CM | POA: Diagnosis not present

## 2020-10-01 DIAGNOSIS — R739 Hyperglycemia, unspecified: Secondary | ICD-10-CM | POA: Diagnosis not present

## 2020-10-01 DIAGNOSIS — I639 Cerebral infarction, unspecified: Secondary | ICD-10-CM | POA: Diagnosis not present

## 2020-10-01 DIAGNOSIS — Z87891 Personal history of nicotine dependence: Secondary | ICD-10-CM | POA: Diagnosis not present

## 2020-10-01 DIAGNOSIS — F1290 Cannabis use, unspecified, uncomplicated: Secondary | ICD-10-CM | POA: Diagnosis not present

## 2020-10-01 DIAGNOSIS — M6281 Muscle weakness (generalized): Secondary | ICD-10-CM | POA: Diagnosis not present

## 2020-10-01 DIAGNOSIS — G459 Transient cerebral ischemic attack, unspecified: Secondary | ICD-10-CM | POA: Diagnosis not present

## 2020-10-01 DIAGNOSIS — I480 Paroxysmal atrial fibrillation: Secondary | ICD-10-CM | POA: Diagnosis not present

## 2020-10-01 DIAGNOSIS — Z20818 Contact with and (suspected) exposure to other bacterial communicable diseases: Secondary | ICD-10-CM | POA: Diagnosis not present

## 2020-10-01 DIAGNOSIS — I4510 Unspecified right bundle-branch block: Secondary | ICD-10-CM | POA: Diagnosis not present

## 2020-10-01 DIAGNOSIS — I110 Hypertensive heart disease with heart failure: Secondary | ICD-10-CM | POA: Diagnosis present

## 2020-10-01 DIAGNOSIS — E1165 Type 2 diabetes mellitus with hyperglycemia: Secondary | ICD-10-CM | POA: Diagnosis not present

## 2020-10-01 DIAGNOSIS — H53462 Homonymous bilateral field defects, left side: Secondary | ICD-10-CM | POA: Diagnosis not present

## 2020-10-01 DIAGNOSIS — I5033 Acute on chronic diastolic (congestive) heart failure: Secondary | ICD-10-CM | POA: Diagnosis not present

## 2020-10-01 DIAGNOSIS — I700 Atherosclerosis of aorta: Secondary | ICD-10-CM | POA: Diagnosis not present

## 2020-10-01 DIAGNOSIS — R278 Other lack of coordination: Secondary | ICD-10-CM | POA: Diagnosis not present

## 2020-10-01 DIAGNOSIS — J449 Chronic obstructive pulmonary disease, unspecified: Secondary | ICD-10-CM | POA: Diagnosis not present

## 2020-10-01 DIAGNOSIS — I5042 Chronic combined systolic (congestive) and diastolic (congestive) heart failure: Secondary | ICD-10-CM | POA: Diagnosis not present

## 2020-10-01 DIAGNOSIS — R41841 Cognitive communication deficit: Secondary | ICD-10-CM | POA: Diagnosis not present

## 2020-10-01 DIAGNOSIS — E1069 Type 1 diabetes mellitus with other specified complication: Secondary | ICD-10-CM | POA: Diagnosis not present

## 2020-10-01 DIAGNOSIS — E1059 Type 1 diabetes mellitus with other circulatory complications: Secondary | ICD-10-CM | POA: Diagnosis not present

## 2020-10-01 DIAGNOSIS — I10 Essential (primary) hypertension: Secondary | ICD-10-CM | POA: Diagnosis not present

## 2020-10-01 DIAGNOSIS — E785 Hyperlipidemia, unspecified: Secondary | ICD-10-CM | POA: Diagnosis not present

## 2020-10-01 DIAGNOSIS — Z8673 Personal history of transient ischemic attack (TIA), and cerebral infarction without residual deficits: Secondary | ICD-10-CM | POA: Diagnosis not present

## 2020-10-01 DIAGNOSIS — I152 Hypertension secondary to endocrine disorders: Secondary | ICD-10-CM | POA: Diagnosis not present

## 2020-10-01 DIAGNOSIS — Z1509 Genetic susceptibility to other malignant neoplasm: Secondary | ICD-10-CM | POA: Diagnosis not present

## 2020-10-01 DIAGNOSIS — Z85038 Personal history of other malignant neoplasm of large intestine: Secondary | ICD-10-CM | POA: Diagnosis not present

## 2020-10-01 DIAGNOSIS — Z7982 Long term (current) use of aspirin: Secondary | ICD-10-CM | POA: Diagnosis not present

## 2020-10-01 DIAGNOSIS — I5021 Acute systolic (congestive) heart failure: Secondary | ICD-10-CM | POA: Diagnosis not present

## 2020-10-01 DIAGNOSIS — Z7401 Bed confinement status: Secondary | ICD-10-CM | POA: Diagnosis not present

## 2020-10-01 DIAGNOSIS — J069 Acute upper respiratory infection, unspecified: Secondary | ICD-10-CM | POA: Diagnosis not present

## 2020-10-01 DIAGNOSIS — E1159 Type 2 diabetes mellitus with other circulatory complications: Secondary | ICD-10-CM | POA: Diagnosis not present

## 2020-10-01 DIAGNOSIS — C182 Malignant neoplasm of ascending colon: Secondary | ICD-10-CM | POA: Diagnosis present

## 2020-10-01 DIAGNOSIS — N179 Acute kidney failure, unspecified: Secondary | ICD-10-CM | POA: Diagnosis not present

## 2020-10-01 DIAGNOSIS — Z79899 Other long term (current) drug therapy: Secondary | ICD-10-CM | POA: Diagnosis not present

## 2020-10-01 DIAGNOSIS — Z9221 Personal history of antineoplastic chemotherapy: Secondary | ICD-10-CM | POA: Diagnosis not present

## 2020-10-01 DIAGNOSIS — I509 Heart failure, unspecified: Secondary | ICD-10-CM | POA: Diagnosis not present

## 2020-10-01 DIAGNOSIS — E109 Type 1 diabetes mellitus without complications: Secondary | ICD-10-CM | POA: Diagnosis not present

## 2020-10-01 DIAGNOSIS — I69354 Hemiplegia and hemiparesis following cerebral infarction affecting left non-dominant side: Secondary | ICD-10-CM | POA: Diagnosis not present

## 2020-10-01 LAB — BASIC METABOLIC PANEL
Anion gap: 9 (ref 5–15)
BUN: 21 mg/dL (ref 8–23)
CO2: 23 mmol/L (ref 22–32)
Calcium: 8.9 mg/dL (ref 8.9–10.3)
Chloride: 100 mmol/L (ref 98–111)
Creatinine, Ser: 1.41 mg/dL — ABNORMAL HIGH (ref 0.61–1.24)
GFR, Estimated: 54 mL/min — ABNORMAL LOW (ref >60)
Glucose, Bld: 216 mg/dL — ABNORMAL HIGH (ref 70–99)
Potassium: 4.4 mmol/L (ref 3.5–5.1)
Sodium: 132 mmol/L — ABNORMAL LOW (ref 135–145)

## 2020-10-01 LAB — GLUCOSE, CAPILLARY: Glucose-Capillary: 187 mg/dL — ABNORMAL HIGH (ref 70–99)

## 2020-10-01 LAB — SARS CORONAVIRUS 2 (TAT 6-24 HRS): SARS Coronavirus 2: NEGATIVE

## 2020-10-01 NOTE — TOC Transition Note (Addendum)
Transition of Care [facility]) - CM/SW Discharge Note Heart Failure   Patient Details  Name: Greg Robertson MRN: [id number] Date of Birth: June 24, 1950  Transition of Care [facility]) CM/SW Contact:  [place], [place] Phone Number: 10/01/2020, 9:50 AM   Clinical Narrative:    Patient will DC to: [facility] Anticipated DC date: 10/01/2020 Family notified: Yes, patients sister and her husband Ed Transport by: Corey Harold   Per MD patient ready for DC to SNF. RN to call report prior to discharge [phone] [id number]). RN, patient, patient's family, and facility notified of DC. Discharge Summary and FL2 sent to facility. DC packet on chart. Awaiting go ahead from patients nurse and then CSW will call Ambulance transport for patient.   11:45 - CSW requested ambulance transport for [name] and will be able to transport him to [place] in an hour.  CSW will sign off for now as social work intervention is no longer needed. Please consult [place] again if new needs arise.      Final next level of care: Skilled Nursing Facility Barriers to Discharge: No Barriers Identified   Patient Goals and CMS Choice Patient states their goals for this hospitalization and ongoing recovery are:: to return home safely after rehab for stroke or possible ALF CMS Medicare.gov Compare Post Acute Care list provided to:: Patient Represenative (must comment) (patients sister susan) Choice offered to / list presented to : Patient, Sibling  Discharge Placement              Patient chooses bed at: [facility] Patient to be transferred to facility by: [place] Name of family member notified: Patients sister Manuela Schwartz and her husband Ed Patient and family notified of of transfer: 10/01/20  Discharge Plan and Services In-house Referral: Clinical Social Work Discharge Planning Services: [employer] Consult Post Acute Care Choice: [place]                               Social Determinants of Health  (SDOH) Interventions Food Insecurity Interventions: Intervention Not Indicated Financial Strain Interventions: Intervention Not Indicated Housing Interventions: Intervention Not Indicated Transportation Interventions: [facility]   Readmission Risk Interventions No flowsheet data found.  Jovonta Levit, MSW, [place] Heart Failure Social Worker

## 2020-10-01 NOTE — Progress Notes (Signed)
Heart Failure Stewardship Pharmacist Progress Note   PCP: Dettinger, Fransisca Kaufmann, MD PCP-Cardiologist: None    HPI:  70 yo M with PMH of afib, aortic atherosclerosis, HTN, HLD, T1DM, CVA, colon cancer s/p R colectomy and FOLFOX (2013). He was admitted on 6/12 for acute CVA. An ECHO was done on 6/13 and he was found to have LVEF 30-35% and normal RV function.   Discharge HF Medications: Furosemide 20 mg daily  Prior to admission HF Medications: Lisinopril 10 mg daily  Pertinent Lab Values: Serum creatinine 1.41, BUN 21, Potassium 4.4, Sodium 132, BNP 1317.8  Vital Signs: Weight: 172 lbs (admission weight: 172 lbs) - not collecting daily weights Blood pressure: 110/80s  Heart rate: 90s   Medication Assistance / Insurance Benefits Check: Does the patient have prescription insurance?  Yes Type of insurance plan: Medicare + BCBS supplement   Outpatient Pharmacy:  Prior to admission [facility] Is the patient willing to use [facility] at discharge? Yes Is the patient willing to transition their outpatient pharmacy to utilize a [facility] outpatient pharmacy?   Pending    Assessment/Plan: 1. Acute systolic CHF (EF 85-46%). NYHA class II symptoms. - Continue furosemide 20 mg daily - Continue carvedilol 3.125 mg BID at discharge - Permissive HTN x 48 hours post stroke (presented for stroke on 6/12 @1500 ) >> completed - Consider starting Entresto 24/26 mg BID or spironolactone 12.5 mg daily at follow up - No SGLT2i with T1DM   2. Patient assistance: - None pending - HF TOC appt made for 6/22 - Dispo: SNF  Kerby Nora, PharmD, BCPS Heart Failure Stewardship Pharmacist Phone [phone]

## 2020-10-01 NOTE — Progress Notes (Signed)
Physical Therapy Treatment Patient Details Name: Greg Robertson MRN: [id number] DOB: 08/18/1950 Today's Date: 10/01/2020    History of Present Illness Pt is 70 yo male who had onset of visual changes 1 day prior to admission. CT showed R occipital CVA. PMH: Afib, poorly controlled DM1. former smoker, HTN, R eye deficits    PT Comments    Pt demonstrates deficits in functional mobility, vision, safety, and gait. Pt accepts dynamic gait challenges and experiences 3 LOB, able to self-correct. Pt continues to demonstrate difficulty with attending to objects and signs on his L side, requiring cueing for safety. Pt demonstrates safety risk and risk of falling in community secondary to visual impairments. Pt will benefit from continued acute PT to improve safety and independence in mobility.   Follow Up Recommendations  SNF     Equipment Recommendations  None recommended by PT    Recommendations for Other Services       Precautions / Restrictions Precautions Precautions: Fall Precaution Comments: L homonymous hemianopsia on top of previous visual deficits R eye Restrictions Weight Bearing Restrictions: No    Mobility  Bed Mobility               General bed mobility comments: Pt received sitting at EOB    Transfers Overall transfer level: Independent                  Ambulation/Gait Ambulation/Gait assistance: Supervision;Min guard Gait Distance (Feet):  (limited community distances) Assistive device: None Gait Pattern/deviations: Decreased stride length Gait velocity: reduced Gait velocity interpretation: 1.31 - 2.62 ft/sec, indicative of limited community ambulator General Gait Details: Pt accepts dynamic gait activities including scanning to R and L and negotiating obstacles. Pt experiences 3 LOB during session, but is able to self-correct. During scanning activities, pt asked to read signs and misses words or numbers preceding on L.   Stairs              Wheelchair Mobility    Modified Rankin (Stroke Patients Only) Modified Rankin (Stroke Patients Only) Pre-Morbid Rankin Score: No symptoms Modified Rankin: Slight disability     Balance Overall balance assessment: Needs assistance Sitting-balance support: Feet supported Sitting balance-Leahy Scale: Good Sitting balance - Comments: Pt able to maintain balance without reliance of UE support when donning shoes.     Standing balance-Leahy Scale: Fair Standing balance comment: Pt tolerates static standing without reliance of UE support.                            Cognition Arousal/Alertness: Awake/alert Behavior During Therapy: WFL for tasks assessed/performed Overall Cognitive Status: Impaired/Different from baseline Area of Impairment: Awareness;Safety/judgement                         Safety/Judgement: Decreased awareness of deficits Awareness: Emergent   General Comments: Pt asks if floor has rises leading to his R foot catching during gait activities.      Exercises      General Comments General comments (skin integrity, edema, etc.): VSS on RA.      Pertinent Vitals/Pain Pain Assessment: No/denies pain    Home Living Family/patient expects to be discharged to:: Skilled nursing facility Living Arrangements: Other (Comment)                  Prior Function            PT Goals (current goals can now  be found in the care plan section) Acute Rehab PT Goals Patient Stated Goal: Get better and go home Progress towards PT goals: Progressing toward goals    Frequency    Min 3X/week      PT Plan Current plan remains appropriate    Co-evaluation              AM-PAC PT "6 Clicks" Mobility   Outcome Measure  Help needed turning from your back to your side while in a flat bed without using bedrails?: None Help needed moving from lying on your back to sitting on the side of a flat bed without using bedrails?: None Help  needed moving to and from a bed to a chair (including a wheelchair)?: None Help needed standing up from a chair using your arms (e.g., wheelchair or bedside chair)?: None Help needed to walk in hospital room?: A Little Help needed climbing 3-5 steps with a railing? : A Little 6 Click Score: 22    End of Session Equipment Utilized During Treatment: Gait belt Activity Tolerance: Patient tolerated treatment well Patient left: in bed;with call bell/phone within reach Nurse Communication: Mobility status PT Visit Diagnosis: Unsteadiness on feet (R26.81);Other abnormalities of gait and mobility (R26.89)     Time: 3748-2707 PT Time Calculation (min) (ACUTE ONLY): 23 min  Charges:  $Gait Training: 8-22 mins $Therapeutic Activity: 8-22 mins                     Acute Rehab  Pager: [phone]    Garwin Brothers, SPT  10/01/2020, 2:08 PM

## 2020-10-01 NOTE — Progress Notes (Signed)
Report attempted x4 to [facility]. Patient ready for discharge. Telemetry and IV removed, transportation called.

## 2020-10-01 NOTE — Discharge Summary (Signed)
Physician Discharge Summary  Greg Robertson CHY:[id number] DOB: 1950/08/24 DOA: 09/27/2020  PCP: Dettinger, Fransisca Kaufmann, MD  Admit date: 09/27/2020 Discharge date: 10/01/2020  Admitted From: Home(Home, ALF, ILF, SNF) Disposition:  Home  Recommendations for Outpatient Follow-up:  Follow up with neurology 4 weeks Please obtain BMP/CBC in one week You will need to follow-up with cardiology in 4 weeks we will need to be started on Entresto as an outpatient and will need an ischemic work-up.  Once cleared by neurology.   Home Health:No Equipment/Devices:None  Discharge Condition:Stable CODE STATUS:Full Diet recommendation: Heart Healthy   Brief/Interim Summary: 70 y.o. male past medical history of atrial fibrillation not on anticoagulation, poorly controlled diabetes mellitus type I former smoker who started having 1 day prior to admission of sudden onset left-sided vision which is persistent, in the ED CT of the head showed a right occipital stroke mild mass affect on the cistern and the occipital right lateral horn  Discharge Diagnoses:  Principal Problem:   Acute ischemic stroke ([place]) Active Problems:   DM type 1 (diabetes mellitus, type 1) ([place])   Acute on chronic diastolic CHF (congestive heart failure) (HCC)   Hypertension associated with diabetes ([place])   A-fib ([place])   AKI (acute kidney injury) ([place])   Left homonymous hemianopsia  Acute ischemic stroke [facility]): HgbA1c 8.7, fasting lipid panel LDL greater than 100 HDL g less than 40 he was started on Lipitor. MRI of the brain showed subacute hemorrhagic right PCA distribution infarct, there is a small volume blood in the occipital horns, with small remote left thalamic lacunar infarct PT recommended no PT follow-up.  Patient evaluated the patient recommended to follow-up with them as an outpatient. 2D echo showed an EF of 30 to 35% with global hypokinesia and grade 2 diastolic dysfunction, as the apex was not visualized we will  get a 2D echo with contrast to rule out apical thrombus. No events on telemetry. Neurology recommended to continue aspirin, due to the large size of his stroke hemorrhage they recommended no anticoagulation at this time. He will be discharged on aspirin. To repeat a CT scan of the head in 14 days if improvement and no frank blood can initiate Pradaxa at that time, not a candidate for Eliquis due to side effects.    DM type 1 (diabetes mellitus, type 1) (HCC) Worsen A1c, his long-acting insulin was increased he will continue sliding scale at home.   New Acute on chronic systolic and diastolic heart failure Lowest weight is around 77-78 kg which is what he weighs on admission. 2D echo done on 09/28/2020 showed an EF of 87% grade 2 diastolic heart failure apex was not visualized. Blood pressure is stabilizing, we will not start beta-blocker and Entresto at this time as it might worsen the stroke. He will definitely need an ischemic evaluation as an outpatient, will consult cardiology. He was started on low-dose Lasix before discharge we will start Coreg in 5 days follow-up with cardiology and titrate as needed.   Essential hypertension associated with diabetes ([place]) Held antihypertensive medications appears euvolemic physical exam.  Paroxysmal  A-fib (HCC) Sinus tachycardia continue aspirin no events on telemetry. Not a candidate for anticoagulation at this time due to his large subacute hemorrhagic infarct.  AKI (acute kidney injury) ([place]) Creatinine has returned to baseline we will start low-dose Lasix.        Discharge Instructions  Discharge Instructions     Ambulatory referral to Neurology   Complete by: As directed  Follow up with stroke clinic NP (Jessica [place] or Cecille Rubin, if both not available, consider Zachery Dauer, or [place]) at [facility] in about 4 weeks. Thanks.   Diet - low sodium heart healthy   Complete by: As directed    Diet - low sodium heart healthy    Complete by: As directed    Increase activity slowly   Complete by: As directed    Increase activity slowly   Complete by: As directed       Allergies as of 10/01/2020       Reactions   Oxycodone Nausea And Vomiting        Medication List     STOP taking these medications    lisinopril 10 MG tablet Commonly known as: Zestril   simvastatin 40 MG tablet Commonly known as: ZOCOR       TAKE these medications    acetaminophen 500 MG tablet Commonly known as: TYLENOL Take 1,000 mg by mouth every 6 (six) hours as needed for mild pain or headache. Pain   aspirin 81 MG EC tablet Take 1 tablet (81 mg total) by mouth daily. Swallow whole.   atorvastatin 40 MG tablet Commonly known as: LIPITOR Take 1 tablet (40 mg total) by mouth daily.   carvedilol 3.125 MG tablet Commonly known as: Coreg Take 1 tablet (3.125 mg total) by mouth 2 (two) times daily. Start taking on: October 05, 2020   FreeStyle Libre 2 Reader Kerrin Mo USE IN THE MORNING, AT NOON, IN THE EVENING, AND AT BEDTIME AS DIRECTED Dx E10.9   FreeStyle Libre 2 Sensor Misc 1 each by Does not apply route every 14 (fourteen) days   furosemide 20 MG tablet Commonly known as: LASIX Take 1 tablet (20 mg total) by mouth daily.   glucose blood test strip Use as instructed What changed: additional instructions   insulin aspart 100 UNIT/ML injection Commonly known as: NovoLOG INJECT 8-15 UNITS THREE TIMES DAILY BEFORE MEALS What changed:  how much to take how to take this when to take this additional instructions   Insulin Pen Needle 32G X 4 MM Misc Use as directed   levocetirizine 5 MG tablet Commonly known as: XYZAL Take 1 tablet (5 mg total) by mouth daily. What changed: when to take this   multivitamin tablet Take 1 tablet by mouth every morning.   Tyler Aas FlexTouch 100 UNIT/ML FlexTouch Pen Generic drug: insulin degludec Inject 30 Units into the skin every morning. What changed: how much to take    triamcinolone 55 MCG/ACT Aero nasal inhaler Commonly known as: NASACORT INSTILL 1 SPRAY IN EACH NOSTRIL ONCE OR TWICE DAILY AS NEEDED. What changed:  how much to take how to take this when to take this additional instructions   VITAMIN C PO Take 1 tablet by mouth every morning.   Zioptan 0.0015 % Soln Generic drug: Tafluprost (PF) Place 1 drop into both eyes at bedtime.        Contact information for follow-up providers     [facility]. Schedule an appointment as soon as possible for a visit in 1 month(s).   Specialty: Neurology Why: stroke clinic Contact information: [place] Follow up.   Specialty: Cardiology Why: Follow-up in Heart Failure Clinic Scheduled for 10/07/2020 at 11am. Contact information: [address] [id number] [place] [id number] [phone]  Minus Breeding, MD Follow up.   Specialty: Cardiology Why: Hospital follow-up with Dr. Percival Spanish in our [place] office shceduled for 11/25/2020 at 3:40pm. Please arrive 15 minutes early for check-in. If this date/time does not work for you, please call our office to reschedule. Contact information: [address] [place] [address] [phone]              Contact information for after-discharge care     Destination     [facility] Preferred SNF .   Service: Skilled Nursing Contact information: [address] [address] [phone]                    Allergies  Allergen Reactions   Oxycodone Nausea And Vomiting    Consultations: Neurology   Procedures/Studies: CT Angio Head W or Wo Contrast  Result Date: 09/27/2020 CLINICAL DATA:  Change in vision EXAM: CT ANGIOGRAPHY HEAD AND NECK TECHNIQUE: Multidetector CT imaging of the head and neck was  performed using the standard protocol during bolus administration of intravenous contrast. Multiplanar CT image reconstructions and MIPs were obtained to evaluate the vascular anatomy. Carotid stenosis measurements (when applicable) are obtained utilizing NASCET criteria, using the distal internal carotid diameter as the denominator. CONTRAST:  36mL OMNIPAQUE IOHEXOL 350 MG/ML SOLN COMPARISON:  None. FINDINGS: CTA NECK Aortic arch: Great vessel origins are patent. Right carotid system: Patent. Noncalcified plaque along the common carotid with less than 50% stenosis. Calcified plaque along the proximal internal carotid with less than 50% stenosis. Left carotid system: Patent. Mixed plaque at the internal carotid origin with less than 50% stenosis. Vertebral arteries: Patent and codominant. Mild plaque at the left vertebral origin. Skeleton: No significant osseous abnormality. Other neck: Unremarkable. Upper chest: Bilateral pleural effusions. Review of the MIP images confirms the above findings CTA HEAD Anterior circulation: Intracranial internal carotid arteries are patent with calcified plaque causing mild stenosis. Anterior and middle cerebral arteries are patent. Posterior circulation: Intracranial vertebral arteries are patent. Basilar artery is patent. Major cerebellar artery origins are patent. Left posterior cerebral artery is patent. No proximal right PCA occlusion. There is some right P3 and P4 irregularity with possible occlusion of a proximal P4 branch noted. Venous sinuses: Patent as allowed by contrast bolus timing. Review of the MIP images confirms the above findings IMPRESSION: There is no proximal intracranial vessel occlusion; specifically no proximal right PCA occlusion. Irregularity more distally with possible proximal P4 branch occlusion. No large vessel occlusion in the neck. Mild plaque at the left vertebral origin. Plaque at the ICA origins causes less than 50% stenosis. Bilateral pleural  effusions. Electronically Signed   By: Macy Mis M.D.   On: 09/27/2020 19:56   DG Chest 2 View  Result Date: 09/29/2020 CLINICAL DATA:  Cough, weakness, dyspnea, code stroke EXAM: CHEST - 2 VIEW COMPARISON:  09/27/2020 FINDINGS: Normal heart size mediastinal contours. Bibasilar pleural effusions and atelectasis similar to prior study. Upper lungs clear. LEFT apex scarring stable. No new infiltrate or pneumothorax. Bones demineralized. IMPRESSION: Persistent bibasilar pleural effusions and atelectasis slightly greater on LEFT, unchanged. Electronically Signed   By: Lavonia Dana M.D.   On: 09/29/2020 13:38   DG Chest 2 View  Result Date: 09/27/2020 CLINICAL DATA:  Cough and shortness of breath. EXAM: CHEST - 2 VIEW COMPARISON:  Chest x-ray dated Sep 04, 2017. FINDINGS: The heart size and mediastinal contours are within normal limits. Pulmonary vascular congestion. Small bilateral pleural effusions and bibasilar  atelectasis. No pneumothorax. No acute osseous abnormality. IMPRESSION: 1. Pulmonary vascular congestion and small bilateral pleural effusions. Electronically Signed   By: Titus Dubin M.D.   On: 09/27/2020 16:12   CT Head Wo Contrast  Result Date: 09/27/2020 CLINICAL DATA:  Acute stroke suspected. Headache. Left vision changes. EXAM: CT HEAD WITHOUT CONTRAST TECHNIQUE: Contiguous axial images were obtained from the base of the skull through the vertex without intravenous contrast. COMPARISON:  None. FINDINGS: Brain: No subdural, epidural, or subarachnoid hemorrhage. There is an infarct in the right occipital lobe. No other sites of cortical ischemia or infarct identified. The occipital horn of the right lateral ventricle is compressed due to mass effect from the infarct. The ventricles are otherwise normal. No midline shift. Basal cisterns are patent although there is mild narrowing of the quadrigeminal plate cistern on the right due to the infarct. The cerebellum and brainstem are normal.  Vascular: Calcified atherosclerosis is seen in the intracranial carotids. Skull: Normal. Negative for fracture or focal lesion. Sinuses/Orbits: No acute finding. Other: None. IMPRESSION: 1. Right occipital lobe infarct consistent with the patient's symptoms. The infarct exerts mild mass effect on the right side of the quadrigeminal plate cistern and significant mass effect on the occipital horn of the right lateral ventricle. There is no midline shift however. There is likely punctate hemorrhage scattered throughout the infarct. No large hematoma. 2. No other abnormalities. Electronically Signed   By: Dorise Bullion III M.D   On: 09/27/2020 19:38   CT Angio Neck W and/or Wo Contrast  Result Date: 09/27/2020 CLINICAL DATA:  Change in vision EXAM: CT ANGIOGRAPHY HEAD AND NECK TECHNIQUE: Multidetector CT imaging of the head and neck was performed using the standard protocol during bolus administration of intravenous contrast. Multiplanar CT image reconstructions and MIPs were obtained to evaluate the vascular anatomy. Carotid stenosis measurements (when applicable) are obtained utilizing NASCET criteria, using the distal internal carotid diameter as the denominator. CONTRAST:  54mL OMNIPAQUE IOHEXOL 350 MG/ML SOLN COMPARISON:  None. FINDINGS: CTA NECK Aortic arch: Great vessel origins are patent. Right carotid system: Patent. Noncalcified plaque along the common carotid with less than 50% stenosis. Calcified plaque along the proximal internal carotid with less than 50% stenosis. Left carotid system: Patent. Mixed plaque at the internal carotid origin with less than 50% stenosis. Vertebral arteries: Patent and codominant. Mild plaque at the left vertebral origin. Skeleton: No significant osseous abnormality. Other neck: Unremarkable. Upper chest: Bilateral pleural effusions. Review of the MIP images confirms the above findings CTA HEAD Anterior circulation: Intracranial internal carotid arteries are patent with  calcified plaque causing mild stenosis. Anterior and middle cerebral arteries are patent. Posterior circulation: Intracranial vertebral arteries are patent. Basilar artery is patent. Major cerebellar artery origins are patent. Left posterior cerebral artery is patent. No proximal right PCA occlusion. There is some right P3 and P4 irregularity with possible occlusion of a proximal P4 branch noted. Venous sinuses: Patent as allowed by contrast bolus timing. Review of the MIP images confirms the above findings IMPRESSION: There is no proximal intracranial vessel occlusion; specifically no proximal right PCA occlusion. Irregularity more distally with possible proximal P4 branch occlusion. No large vessel occlusion in the neck. Mild plaque at the left vertebral origin. Plaque at the ICA origins causes less than 50% stenosis. Bilateral pleural effusions. Electronically Signed   By: Macy Mis M.D.   On: 09/27/2020 19:56   MR BRAIN WO CONTRAST  Result Date: 09/28/2020 CLINICAL DATA:  Initial evaluation for acute stroke,  visual disturbance. EXAM: MRI HEAD WITHOUT CONTRAST TECHNIQUE: Multiplanar, multiecho pulse sequences of the brain and surrounding structures were obtained without intravenous contrast. COMPARISON:  Prior CTs from earlier the same day. FINDINGS: Brain: Cerebral volume within normal limits for age. Small remote lacunar infarct at the left thalamus. No other significant small vessel disease for age. Moderately large evolving acute to early subacute right PCA distribution infarct involving the right temporal occipital region is seen, corresponding with abnormality on prior CT (series 5, image 70). Extensive susceptibility artifact seen throughout the area of infarction, consistent with associated hemorrhage. No frank intraparenchymal hematoma. Trace blood products seen layering within the occipital horns of both lateral ventricles, likely related to redistribution of small volume associated  subarachnoid blood (series 14, image 28). No significant midline shift or regional mass effect. No hydrocephalus or trapping. No other evidence for acute or subacute ischemia. Gray-white matter differentiation otherwise maintained. No other areas of remote cortical infarction. No other evidence for acute or chronic intracranial hemorrhage. No mass lesion, midline shift or mass effect. No extra-axial fluid collection. Pituitary gland suprasellar region normal. Midline structures intact. Vascular: Major intracranial vascular flow voids are maintained. Skull and upper cervical spine: Craniocervical junction within normal limits. Bone marrow signal intensity normal. No scalp soft tissue abnormality. Sinuses/Orbits: Globes and orbital soft tissues within normal limits. Mild scattered mucosal thickening noted within the ethmoidal air cells and maxillary sinuses. Paranasal sinuses are otherwise clear. No significant mastoid effusion. Inner ear structures grossly normal. Other: None. IMPRESSION: 1. Moderately large evolving acute to early subacute hemorrhagic right PCA distribution infarct. No significant mass effect. 2. Trace blood products layering within the occipital horns of both lateral ventricles, likely related to redistribution of small volume associated subarachnoid blood. No hydrocephalus or trapping. 3. Small remote left thalamic lacunar infarct. Electronically Signed   By: Jeannine Boga M.D.   On: 09/28/2020 00:45   ECHOCARDIOGRAM COMPLETE  Result Date: 09/28/2020    ECHOCARDIOGRAM REPORT   Patient Name:   Greg Robertson Date of Exam: 09/28/2020 Medical Rec #:  [id number]          Height:       75.0 in Accession #:    [id number]         Weight:       172.0 lb Date of Birth:  Nov 03, 1950          BSA:          2.058 m Patient Age:    69 years           BP:           126/82 mmHg Patient Gender: M                  HR:           95 bpm. Exam Location:  Inpatient Procedure: 2D Echo, Cardiac Doppler and  Color Doppler Indications:    Stroke  History:        Patient has prior history of Echocardiogram examinations, most                 recent 03/11/2012. Risk Factors:Former Smoker and Diabetes.  Sonographer:    Cammy Brochure Referring Phys: [place]  1. Left ventricular ejection fraction, by estimation, is 30 to 35%. The left ventricle has moderately decreased function. The left ventricle demonstrates global hypokinesis. Left ventricular diastolic parameters are consistent with Grade II diastolic dysfunction (pseudonormalization). Elevated left atrial pressure.  2. LV apex not well visualized. In setting of CVA with LV systolic dysfunction, would consider repeat limited echo with contrast to rule out apical thrombus  3. Right ventricular systolic function is normal. The right ventricular size is normal. Tricuspid regurgitation signal is inadequate for assessing PA pressure.  4. The mitral valve is normal in structure. No evidence of mitral valve regurgitation. No evidence of mitral stenosis.  5. The aortic valve was not well visualized. Aortic valve regurgitation is not visualized. No aortic stenosis is present.  6. The inferior vena cava is normal in size with greater than 50% respiratory variability, suggesting right atrial pressure of 3 mmHg. FINDINGS  Left Ventricle: Left ventricular ejection fraction, by estimation, is 30 to 35%. The left ventricle has moderately decreased function. The left ventricle demonstrates global hypokinesis. The left ventricular internal cavity size was normal in size. There is no left ventricular hypertrophy. Left ventricular diastolic parameters are consistent with Grade II diastolic dysfunction (pseudonormalization). Elevated left atrial pressure. Right Ventricle: The right ventricular size is normal. No increase in right ventricular wall thickness. Right ventricular systolic function is normal. Tricuspid regurgitation signal is inadequate for assessing PA  pressure. Left Atrium: Left atrial size was normal in size. Right Atrium: Right atrial size was normal in size. Pericardium: There is no evidence of pericardial effusion. Mitral Valve: The mitral valve is normal in structure. No evidence of mitral valve regurgitation. No evidence of mitral valve stenosis. Tricuspid Valve: The tricuspid valve is normal in structure. Tricuspid valve regurgitation is not demonstrated. Aortic Valve: The aortic valve was not well visualized. Aortic valve regurgitation is not visualized. No aortic stenosis is present. Aortic valve mean gradient measures 2.5 mmHg. Aortic valve peak gradient measures 4.0 mmHg. Aortic valve area, by VTI measures 2.10 cm. Pulmonic Valve: The pulmonic valve was not well visualized. Pulmonic valve regurgitation is not visualized. Aorta: The aortic root and ascending aorta are structurally normal, with no evidence of dilitation. Venous: The inferior vena cava is normal in size with greater than 50% respiratory variability, suggesting right atrial pressure of 3 mmHg. IAS/Shunts: The interatrial septum was not well visualized. Additional Comments: There is a small pleural effusion in the left lateral region.  LEFT VENTRICLE PLAX 2D LVIDd:         5.30 cm  Diastology LVIDs:         3.80 cm  LV e' medial:    5.98 cm/s LV PW:         1.20 cm  LV E/e' medial:  16.3 LV IVS:        0.90 cm  LV e' lateral:   8.38 cm/s LVOT diam:     2.15 cm  LV E/e' lateral: 11.6 LV SV:         39 LV SV Index:   19 LVOT Area:     3.63 cm  RIGHT VENTRICLE             IVC RV Basal diam:  3.60 cm     IVC diam: 1.60 cm RV S prime:     11.10 cm/s TAPSE (M-mode): 1.8 cm LEFT ATRIUM             Index       RIGHT ATRIUM           Index LA diam:        3.60 cm 1.75 cm/m  RA Area:     15.10 cm LA Vol (A2C):   60.1 ml 29.20 ml/m  RA Volume:   46.00 ml  22.35 ml/m LA Vol (A4C):   56.6 ml 27.50 ml/m LA Biplane Vol: 59.3 ml 28.81 ml/m  AORTIC VALVE AV Area (Vmax):    2.28 cm AV Area (Vmean):    2.01 cm AV Area (VTI):     2.10 cm AV Vmax:           100.45 cm/s AV Vmean:          75.450 cm/s AV VTI:            0.186 m AV Peak Grad:      4.0 mmHg AV Mean Grad:      2.5 mmHg LVOT Vmax:         63.10 cm/s LVOT Vmean:        41.800 cm/s LVOT VTI:          0.108 m LVOT/AV VTI ratio: 0.58  AORTA Ao Root diam: 3.05 cm Ao Asc diam:  2.65 cm MITRAL VALVE MV Area (PHT): 7.44 cm    SHUNTS MV Decel Time: 102 msec    Systemic VTI:  0.11 m MV E velocity: 97.30 cm/s  Systemic Diam: 2.15 cm MV A velocity: 42.80 cm/s MV E/A ratio:  2.27 Oswaldo Milian MD Electronically signed by Oswaldo Milian MD Signature Date/Time: 09/28/2020/11:17:35 AM    Final    ECHOCARDIOGRAM LIMITED  Result Date: 09/30/2020    ECHOCARDIOGRAM LIMITED REPORT   Patient Name:   Greg Robertson Date of Exam: 09/30/2020 Medical Rec #:  [id number]          Height:       75.0 in Accession #:    [id number]         Weight:       172.0 lb Date of Birth:  08-Jul-1950          BSA:          2.058 m Patient Age:    37 years           BP:           114/81 mmHg Patient Gender: M                  HR:           95 bpm. Exam Location:  Inpatient Procedure: Limited Echo and Intracardiac Opacification Agent Indications:    Other abnormalities of the heart R00.8  History:        Patient has prior history of Echocardiogram examinations, most                 recent 09/28/2020. Arrythmias:Atrial Fibrillation; Risk                 Factors:Hypertension and Diabetes. Substance abuse.  Sonographer:    Jonelle Sidle Dance Referring Phys: [place]  1. Limited study for contrast. Left ventricular ejection fraction, by estimation, is 30 to 35%. The left ventricle has moderately decreased function. The left ventricle demonstrates global hypokinesis. There is no evidence of LV thrombus. Comparison(s): A prior study was performed on 09/28/20. No significant change from prior study. Prior images reviewed side by side. FINDINGS  Left Ventricle: Left  ventricular ejection fraction, by estimation, is 30 to 35%. The left ventricle has moderately decreased function. The left ventricle demonstrates global hypokinesis. Definity contrast agent was given IV to delineate the left ventricular endocardial borders. Rudean Haskell MD Electronically signed by Rudean Haskell MD Signature Date/Time: 09/30/2020/12:03:29 PM    Final    (  Echo, Carotid, EGD, Colonoscopy, ERCP)    Subjective: No complaints.  Discharge Exam: Vitals:   10/01/20 0335 10/01/20 0825  BP: 132/83 106/70  Pulse: 97 (!) 101  Resp: 18 18  Temp: 98.3 F (36.8 C) 98.6 F (37 C)  SpO2: 95% 95%   Vitals:   09/30/20 1640 09/30/20 2320 10/01/20 0335 10/01/20 0825  BP: 118/73 121/81 132/83 106/70  Pulse: (!) 101 98 97 (!) 101  Resp: 16 19 18 18   Temp: 99.1 F (37.3 C) 98.2 F (36.8 C) 98.3 F (36.8 C) 98.6 F (37 C)  TempSrc: Oral Oral Oral Oral  SpO2: 96% 92% 95% 95%  Weight:      Height:        General: Pt is alert, awake, not in acute distress Cardiovascular: RRR, S1/S2 +, no rubs, no gallops Respiratory: CTA bilaterally, no wheezing, no rhonchi Abdominal: Soft, NT, ND, bowel sounds + Extremities: no edema, no cyanosis    The results of significant diagnostics from this hospitalization (including imaging, microbiology, ancillary and laboratory) are listed below for reference.     Microbiology: Recent Results (from the past 240 hour(s))  Resp Panel by RT-PCR (Flu A&B, Covid) Nasopharyngeal Swab     Status: None   Collection Time: 09/27/20  9:05 PM   Specimen: Nasopharyngeal Swab; Nasopharyngeal(NP) swabs in vial transport medium  Result Value Ref Range Status   SARS Coronavirus 2 by RT PCR NEGATIVE NEGATIVE Final    Comment: (NOTE) SARS-CoV-2 target nucleic acids are NOT DETECTED.  The SARS-CoV-2 RNA is generally detectable in upper respiratory specimens during the acute phase of infection. The lowest concentration of SARS-CoV-2 viral copies this  assay can detect is 138 copies/mL. A negative result does not preclude SARS-Cov-2 infection and should not be used as the sole basis for treatment or other patient management decisions. A negative result may occur with  improper specimen collection/handling, submission of specimen other than nasopharyngeal swab, presence of viral mutation(s) within the areas targeted by this assay, and inadequate number of viral copies(<138 copies/mL). A negative result must be combined with clinical observations, patient history, and epidemiological information. The expected result is Negative.  Fact Sheet for Patients:  EntrepreneurPulse.com.au  Fact Sheet for Healthcare Providers:  IncredibleEmployment.be  This test is no t yet approved or cleared by the [place] FDA and  has been authorized for detection and/or diagnosis of SARS-CoV-2 by FDA under an Emergency Use Authorization (EUA). This EUA will remain  in effect (meaning this test can be used) for the duration of the COVID-19 declaration under Section 564(b)(1) of the Act, 21 U.S.C.section 360bbb-3(b)(1), unless the authorization is terminated  or revoked sooner.       Influenza A by PCR NEGATIVE NEGATIVE Final   Influenza B by PCR NEGATIVE NEGATIVE Final    Comment: (NOTE) The Xpert Xpress SARS-CoV-2/FLU/RSV plus assay is intended as an aid in the diagnosis of influenza from Nasopharyngeal swab specimens and should not be used as a sole basis for treatment. Nasal washings and aspirates are unacceptable for Xpert Xpress SARS-CoV-2/FLU/RSV testing.  Fact Sheet for Patients: EntrepreneurPulse.com.au  Fact Sheet for Healthcare Providers: IncredibleEmployment.be  This test is not yet approved or cleared by the [place] FDA and has been authorized for detection and/or diagnosis of SARS-CoV-2 by FDA under an Emergency Use Authorization (EUA). This EUA will  remain in effect (meaning this test can be used) for the duration of the COVID-19 declaration under Section 564(b)(1) of the Act, 21 U.S.C. section  360bbb-3(b)(1), unless the authorization is terminated or revoked.  Performed at [facility], [place] [address], [place], [place] [address]   SARS CORONAVIRUS 2 (TAT 6-24 HRS) Nasopharyngeal Nasopharyngeal Swab     Status: None   Collection Time: 09/30/20  5:50 PM   Specimen: Nasopharyngeal Swab  Result Value Ref Range Status   SARS Coronavirus 2 NEGATIVE NEGATIVE Final    Comment: (NOTE) SARS-CoV-2 target nucleic acids are NOT DETECTED.  The SARS-CoV-2 RNA is generally detectable in upper and lower respiratory specimens during the acute phase of infection. Negative results do not preclude SARS-CoV-2 infection, do not rule out co-infections with other pathogens, and should not be used as the sole basis for treatment or other patient management decisions. Negative results must be combined with clinical observations, patient history, and epidemiological information. The expected result is Negative.  Fact Sheet for Patients: SugarRoll.be  Fact Sheet for Healthcare Providers: https://www.woods-mathews.com/  This test is not yet approved or cleared by the [place] FDA and  has been authorized for detection and/or diagnosis of SARS-CoV-2 by FDA under an Emergency Use Authorization (EUA). This EUA will remain  in effect (meaning this test can be used) for the duration of the COVID-19 declaration under Se ction 564(b)(1) of the Act, 21 U.S.C. section 360bbb-3(b)(1), unless the authorization is terminated or revoked sooner.  Performed at [facility], [place] [address] [place], [place] [address]      Labs: BNP (last 3 results) Recent Labs    09/27/20 1905 09/29/20 1643  BNP 1,566.5* 9,509.3*   Basic Metabolic Panel: Recent Labs  Lab 09/27/20 1559 09/27/20 2115  09/28/20 0732 09/29/20 0546 09/30/20 0339 10/01/20 0226  NA 132* 134* 134* 131* 135 132*  K 5.2* 3.9 4.2 4.2 3.8 4.4  CL 103  --  102 102 101 100  CO2 19*  --  24 20* 21* 23  GLUCOSE 435*  --  123* 251* 66* 216*  BUN 25*  --  19 18 17 21   CREATININE 1.63*  --  1.45* 1.34* 1.20 1.41*  CALCIUM 9.1  --  8.9 8.6* 8.8* 8.9   Liver Function Tests: No results for input(s): AST, ALT, ALKPHOS, BILITOT, PROT, ALBUMIN in the last 168 hours. No results for input(s): LIPASE, AMYLASE in the last 168 hours. No results for input(s): AMMONIA in the last 168 hours. CBC: Recent Labs  Lab 09/27/20 1559 09/27/20 2115  WBC 8.6  --   HGB 14.3 17.0  HCT 43.5 50.0  MCV 92.9  --   PLT 148*  --    Cardiac Enzymes: No results for input(s): CKTOTAL, CKMB, CKMBINDEX, TROPONINI in the last 168 hours. BNP: Invalid input(s): POCBNP CBG: Recent Labs  Lab 09/30/20 0639 09/30/20 1150 09/30/20 1929 09/30/20 2146 10/01/20 0612  GLUCAP 132* 253* 53* 175* 187*   D-Dimer No results for input(s): DDIMER in the last 72 hours. Hgb A1c No results for input(s): HGBA1C in the last 72 hours.  Lipid Profile No results for input(s): CHOL, HDL, LDLCALC, TRIG, CHOLHDL, LDLDIRECT in the last 72 hours.  Thyroid function studies No results for input(s): TSH, T4TOTAL, T3FREE, THYROIDAB in the last 72 hours.  Invalid input(s): FREET3 Anemia work up No results for input(s): VITAMINB12, FOLATE, FERRITIN, TIBC, IRON, RETICCTPCT in the last 72 hours. Urinalysis    Component Value Date/Time   COLORURINE STRAW (A) 09/27/2020 1548   APPEARANCEUR CLEAR 09/27/2020 1548   APPEARANCEUR Clear 09/04/2017 1304   LABSPEC 1.031 (H) 09/27/2020 1548  PHURINE 5.0 09/27/2020 1548   GLUCOSEU >=500 (A) 09/27/2020 1548   HGBUR NEGATIVE 09/27/2020 1548   BILIRUBINUR NEGATIVE 09/27/2020 1548   BILIRUBINUR Negative 09/04/2017 1304   KETONESUR 20 (A) 09/27/2020 1548   PROTEINUR NEGATIVE 09/27/2020 1548   UROBILINOGEN negative  01/21/2015 1104   UROBILINOGEN 0.2 04/06/2013 2058   NITRITE NEGATIVE 09/27/2020 1548   LEUKOCYTESUR NEGATIVE 09/27/2020 1548   Sepsis Labs Invalid input(s): PROCALCITONIN,  WBC,  LACTICIDVEN Microbiology Recent Results (from the past 240 hour(s))  Resp Panel by RT-PCR (Flu A&B, Covid) Nasopharyngeal Swab     Status: None   Collection Time: 09/27/20  9:05 PM   Specimen: Nasopharyngeal Swab; Nasopharyngeal(NP) swabs in vial transport medium  Result Value Ref Range Status   SARS Coronavirus 2 by RT PCR NEGATIVE NEGATIVE Final    Comment: (NOTE) SARS-CoV-2 target nucleic acids are NOT DETECTED.  The SARS-CoV-2 RNA is generally detectable in upper respiratory specimens during the acute phase of infection. The lowest concentration of SARS-CoV-2 viral copies this assay can detect is 138 copies/mL. A negative result does not preclude SARS-Cov-2 infection and should not be used as the sole basis for treatment or other patient management decisions. A negative result may occur with  improper specimen collection/handling, submission of specimen other than nasopharyngeal swab, presence of viral mutation(s) within the areas targeted by this assay, and inadequate number of viral copies(<138 copies/mL). A negative result must be combined with clinical observations, patient history, and epidemiological information. The expected result is Negative.  Fact Sheet for Patients:  EntrepreneurPulse.com.au  Fact Sheet for Healthcare Providers:  IncredibleEmployment.be  This test is no t yet approved or cleared by the [place] FDA and  has been authorized for detection and/or diagnosis of SARS-CoV-2 by FDA under an Emergency Use Authorization (EUA). This EUA will remain  in effect (meaning this test can be used) for the duration of the COVID-19 declaration under Section 564(b)(1) of the Act, 21 U.S.C.section 360bbb-3(b)(1), unless the authorization is  terminated  or revoked sooner.       Influenza A by PCR NEGATIVE NEGATIVE Final   Influenza B by PCR NEGATIVE NEGATIVE Final    Comment: (NOTE) The Xpert Xpress SARS-CoV-2/FLU/RSV plus assay is intended as an aid in the diagnosis of influenza from Nasopharyngeal swab specimens and should not be used as a sole basis for treatment. Nasal washings and aspirates are unacceptable for Xpert Xpress SARS-CoV-2/FLU/RSV testing.  Fact Sheet for Patients: EntrepreneurPulse.com.au  Fact Sheet for Healthcare Providers: IncredibleEmployment.be  This test is not yet approved or cleared by the [place] FDA and has been authorized for detection and/or diagnosis of SARS-CoV-2 by FDA under an Emergency Use Authorization (EUA). This EUA will remain in effect (meaning this test can be used) for the duration of the COVID-19 declaration under Section 564(b)(1) of the Act, 21 U.S.C. section 360bbb-3(b)(1), unless the authorization is terminated or revoked.  Performed at [facility], [place] [address] [place], [place] [address]   SARS CORONAVIRUS 2 (TAT 6-24 HRS) Nasopharyngeal Nasopharyngeal Swab     Status: None   Collection Time: 09/30/20  5:50 PM   Specimen: Nasopharyngeal Swab  Result Value Ref Range Status   SARS Coronavirus 2 NEGATIVE NEGATIVE Final    Comment: (NOTE) SARS-CoV-2 target nucleic acids are NOT DETECTED.  The SARS-CoV-2 RNA is generally detectable in upper and lower respiratory specimens during the acute phase of infection. Negative results do not preclude SARS-CoV-2 infection, do not rule out co-infections with other  pathogens, and should not be used as the sole basis for treatment or other patient management decisions. Negative results must be combined with clinical observations, patient history, and epidemiological information. The expected result is Negative.  Fact Sheet for  Patients: SugarRoll.be  Fact Sheet for Healthcare Providers: https://www.woods-mathews.com/  This test is not yet approved or cleared by the [place] FDA and  has been authorized for detection and/or diagnosis of SARS-CoV-2 by FDA under an Emergency Use Authorization (EUA). This EUA will remain  in effect (meaning this test can be used) for the duration of the COVID-19 declaration under Se ction 564(b)(1) of the Act, 21 U.S.C. section 360bbb-3(b)(1), unless the authorization is terminated or revoked sooner.  Performed at [facility], [place] [address], [place], [place] [address]      Time coordinating discharge: Over 30 minutes  SIGNED:   Charlynne Cousins, MD  [facility] 10/01/2020, 9:44 AM Pager   If 7PM-7AM, please contact night-coverage [email or web address] Password [email or web address]

## 2020-10-02 ENCOUNTER — Non-Acute Institutional Stay (SKILLED_NURSING_FACILITY): Payer: Medicare Other | Admitting: Adult Health

## 2020-10-02 ENCOUNTER — Encounter: Payer: Self-pay | Admitting: Adult Health

## 2020-10-02 DIAGNOSIS — I639 Cerebral infarction, unspecified: Secondary | ICD-10-CM

## 2020-10-02 DIAGNOSIS — N179 Acute kidney failure, unspecified: Secondary | ICD-10-CM

## 2020-10-02 DIAGNOSIS — I480 Paroxysmal atrial fibrillation: Secondary | ICD-10-CM

## 2020-10-02 DIAGNOSIS — E1059 Type 1 diabetes mellitus with other circulatory complications: Secondary | ICD-10-CM

## 2020-10-02 DIAGNOSIS — I5033 Acute on chronic diastolic (congestive) heart failure: Secondary | ICD-10-CM

## 2020-10-02 NOTE — Progress Notes (Signed)
Location:  [place] Room Number: 323-F Place of Service:  SNF (31) Provider:  Durenda Age, DNP, FNP-BC  Patient Care Team: Dettinger, Fransisca Kaufmann, MD as PCP - General (Family Medicine) Ladell Pier, MD as Consulting Physician (Oncology) Minus Breeding, MD as Consulting Physician (Cardiology) [facility] Milus Banister, MD as Attending Physician (Gastroenterology) Lavera Guise, [facility] (Pharmacist) Lavera Guise, [facility] as Pharmacist [facility])  Extended Emergency Contact Information Primary Emergency Contact: Mitchel Honour [place] Phone: [phone] Mobile Phone: [phone] Relation: Sister Secondary Emergency Contact: Ernest Haber [place] Phone: [phone] Mobile Phone: [phone] Relation: Brother  Code Status: Full  Goals of care: Advanced Directive information Advanced Directives 10/02/2020  Does Patient Have a Medical Advance Directive? No  Type of Advance Directive -  Does patient want to make changes to medical advance directive? No - Patient declined  Copy of [place] in Chart? -  Would patient like information on creating a medical advance directive? -  Pre-existing out of facility DNR order (yellow form or pink MOST form) -     Chief Complaint  Patient presents with   Acute Visit    Follow-up hospitalization    HPI:  Pt is a 70 y.o. male who was admitted to [facility] on 10/01/20 post hospital admission 09/27/20 to 10/01/20 for acute ischemic stroke.  A day prior to admission he had a sudden onset left-sided vision which was persistent.  CT of the head showed a right occipital stroke with mild mass affect on the cistern and occipital right lateral horn.  MRI of the brain showed subacute hemorrhagic right PCA distribution infarct.  There is a small volume of blood in the occipital horns, with small remote  left thalamic lacunar infarct.  2D echo showed an EF of 30 to 35% with global hypokinesia and grade 2 diastolic dysfunction.  The apex was not visualized and recommended a 2D echo with contrast to rule out apical thrombus.  Neurology recommended to continue aspirin Due to the large size of the stroke hemorrhage, it was recommended not to have anticoagulation at this time.  He was discharged on aspirin.  He will have a repeat CT scan of the head in 14 days.  If he has improvement and no frank bleeding then Pradaxa can be initiated.  He is not a candidate for Eliquis due to side effects.  He was restarted on low-dose Lasix before discharge due to acute on chronic systolic and diastolic heart failure.  He has a PMH of atrial fibrillation not on anticoagulation, poorly controlled diabetes mellitus type 1 and former smoker.  He was seen in his room today. He stated that he has poor peripheral vision on his left eye and right eye with blind spot.   Past Medical History:  Diagnosis Date   Allergic rhinitis    Anemia    Atrial fibrillation ([place])    Persistent   Cancer of ascending colon, 7cm 05/25/2011   Diabetes mellitus type I ([place])    Glaucoma    Hypertension    Pneumonia 1979 or 1980   Prostate nodule    Substance abuse [facility])    Past Surgical History:  Procedure Laterality Date   broken left shoulder blade and collar bone     FINGER SURGERY  2009   right middle   LAPAROSCOPIC ASSISTED 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     NASAL FRACTURE SURGERY  1968   PORTACATH PLACEMENT  07/01/2011   Procedure: INSERTION PORT-A-CATH;  Surgeon: Adin Hector, MD;  Location: [facility] ORS;  Service: General;  Laterality: Left;  Insertion of Port-A-Catheter Left Internal Jugular   TONSILLECTOMY  1957 - approximate    Allergies  Allergen Reactions   Oxycodone Nausea And Vomiting    Outpatient Encounter Medications as of 10/02/2020  Medication Sig   acetaminophen (TYLENOL) 500 MG tablet Take 1,000 mg  by mouth every 6 (six) hours as needed for mild pain or headache. Pain   Ascorbic Acid (VITAMIN C PO) Take 1 tablet by mouth every morning.    aspirin 81 MG EC tablet Take 1 tablet (81 mg total) by mouth daily. Swallow whole.   atorvastatin (LIPITOR) 40 MG tablet Take 1 tablet (40 mg total) by mouth daily.   [START ON 10/05/2020] carvedilol (COREG) 3.125 MG tablet Take 1 tablet (3.125 mg total) by mouth 2 (two) times daily.   furosemide (LASIX) 20 MG tablet Take 1 tablet (20 mg total) by mouth daily.   glucose blood test strip Use as instructed   insulin aspart (NOVOLOG) 100 UNIT/ML injection INJECT 8-15 UNITS THREE TIMES DAILY BEFORE MEALS (Patient taking differently: INJECT 5 UNITS THREE TIMES DAILY BEFORE MEALS for CBG greater than or equal to 200. Notify provider if CBG is 400.)   levocetirizine (XYZAL) 5 MG tablet Take 1 tablet (5 mg total) by mouth daily.   Multiple Vitamin (MULTIVITAMIN) tablet Take 1 tablet by mouth every morning.    Tafluprost, PF, (ZIOPTAN) 0.0015 % SOLN Place 1 drop into both eyes at bedtime.    triamcinolone (NASACORT) 55 MCG/ACT AERO nasal inhaler INSTILL 1 SPRAY IN EACH NOSTRIL ONCE OR TWICE DAILY AS NEEDED.   Continuous Blood Gluc Receiver (FREESTYLE LIBRE 2 READER) DEVI USE IN THE MORNING, AT NOON, IN THE EVENING, AND AT BEDTIME AS DIRECTED Dx E10.9   Continuous Blood Gluc Sensor (FREESTYLE LIBRE 2 SENSOR) MISC 1 each by Does not apply route every 14 (fourteen) days   insulin degludec (TRESIBA FLEXTOUCH) 100 UNIT/ML FlexTouch Pen Inject 30 Units into the skin every morning.   Insulin Pen Needle 32G X 4 MM MISC Use as directed   No facility-administered encounter medications on file as of 10/02/2020.    Review of Systems  GENERAL: No change in appetite, no fatigue, no weight changes, no fever, chills or weakness MOUTH and THROAT: Denies oral discomfort, gingival pain or bleeding RESPIRATORY: no cough, SOB, DOE, wheezing, hemoptysis CARDIAC: No chest pain or  palpitations GI: No abdominal pain, diarrhea, constipation, heart burn, nausea or vomiting GU: Denies dysuria, frequency, hematuria, incontinence, or discharge NEUROLOGICAL: Denies dizziness, syncope, numbness, or headache PSYCHIATRIC: Denies feelings of depression or anxiety. No report of hallucinations, insomnia, paranoia, or agitation   Immunization History  Administered Date(s) Administered   Fluad Quad(high Dose 65+) 01/25/2019, 04/02/2020   Influenza Split 06/04/2011   Influenza, High Dose Seasonal PF 01/17/2018   Influenza,inj,Quad PF,6+ Mos 04/08/2013, 03/06/2014, 01/21/2015, 02/03/2016   Moderna Sars-Covid-2 Vaccination 05/23/2019, 06/21/2019, 02/12/2020   PPD Test 09/30/2020   Pneumococcal Conjugate-13 01/21/2015   Pneumococcal Polysaccharide-23 06/04/2011, 01/25/2019   Tdap 07/23/2015   Pertinent  Health Maintenance Due  Topic Date Due   OPHTHALMOLOGY EXAM  12/03/2019   URINE MICROALBUMIN  01/25/2020   COLONOSCOPY (Pts 45-57yrs Insurance coverage will need to be confirmed)  11/02/2020 (Originally 01/17/2019)   INFLUENZA VACCINE  11/16/2020   HEMOGLOBIN A1C  03/30/2021   FOOT EXAM  04/02/2021  PNA vac Low Risk Adult  Completed   Fall Risk  08/24/2020 04/02/2020 11/15/2019 10/04/2019 07/12/2019  Falls in the past year? 0 0 0 0 0     Vitals:   10/02/20 1359  BP: (!) 154/89  Pulse: 100  Resp: 16  Temp: (!) 96.6 F (35.9 C)  Weight: 180 lb 3.2 oz (81.7 kg)   Body mass index is 22.52 kg/m.  Physical Exam  GENERAL APPEARANCE: Well nourished. In no acute distress. Normal body habitus SKIN:  Skin is warm and dry.  MOUTH and THROAT: Lips are without lesions. Oral mucosa is moist and without lesions.  RESPIRATORY: Breathing is even & unlabored, BS CTAB CARDIAC: RRR, no murmur,no extra heart sounds GI: Abdomen soft, normal BS, no masses, no tenderness EXTREMITIES:  Able to move X 4 extremities. NEUROLOGICAL: There is no tremor. Speech is clear. Alert and oriented X  3. PSYCHIATRIC:  Affect and behavior are appropriate  Labs reviewed: Recent Labs    09/29/20 0546 09/30/20 0339 10/01/20 0226  NA 131* 135 132*  K 4.2 3.8 4.4  CL 102 101 100  CO2 20* 21* 23  GLUCOSE 251* 66* 216*  BUN 18 17 21   CREATININE 1.34* 1.20 1.41*  CALCIUM 8.6* 8.8* 8.9   Recent Labs    04/02/20 1111 08/24/20 1540  AST 22 23  ALT 16 19  ALKPHOS 86 146*  BILITOT 0.4 0.3  PROT 6.2 6.0  ALBUMIN 4.2 4.0   Recent Labs    04/02/20 1111 08/24/20 1540 09/27/20 1559 09/27/20 2115  WBC 5.9 5.2 8.6  --   NEUTROABS 4.0 2.9  --   --   HGB 13.5 15.1 14.3 17.0  HCT 39.4 46.4 43.5 50.0  MCV 90 96 92.9  --   PLT 169 117* 148*  --    Lab Results  Component Value Date   TSH 1.680 07/12/2019   Lab Results  Component Value Date   HGBA1C 8.7 (H) 09/28/2020   Lab Results  Component Value Date   CHOL 201 (H) 09/28/2020   HDL 62 09/28/2020   LDLCALC 128 (H) 09/28/2020   TRIG 55 09/28/2020   CHOLHDL 3.2 09/28/2020    Significant Diagnostic Results in last 30 days:  CT Angio Head W or Wo Contrast  Result Date: 09/27/2020 CLINICAL DATA:  Change in vision EXAM: CT ANGIOGRAPHY HEAD AND NECK TECHNIQUE: Multidetector CT imaging of the head and neck was performed using the standard protocol during bolus administration of intravenous contrast. Multiplanar CT image reconstructions and MIPs were obtained to evaluate the vascular anatomy. Carotid stenosis measurements (when applicable) are obtained utilizing NASCET criteria, using the distal internal carotid diameter as the denominator. CONTRAST:  71mL OMNIPAQUE IOHEXOL 350 MG/ML SOLN COMPARISON:  None. FINDINGS: CTA NECK Aortic arch: Great vessel origins are patent. Right carotid system: Patent. Noncalcified plaque along the common carotid with less than 50% stenosis. Calcified plaque along the proximal internal carotid with less than 50% stenosis. Left carotid system: Patent. Mixed plaque at the internal carotid origin with less  than 50% stenosis. Vertebral arteries: Patent and codominant. Mild plaque at the left vertebral origin. Skeleton: No significant osseous abnormality. Other neck: Unremarkable. Upper chest: Bilateral pleural effusions. Review of the MIP images confirms the above findings CTA HEAD Anterior circulation: Intracranial internal carotid arteries are patent with calcified plaque causing mild stenosis. Anterior and middle cerebral arteries are patent. Posterior circulation: Intracranial vertebral arteries are patent. Basilar artery is patent. Major cerebellar artery origins are patent. Left  posterior cerebral artery is patent. No proximal right PCA occlusion. There is some right P3 and P4 irregularity with possible occlusion of a proximal P4 branch noted. Venous sinuses: Patent as allowed by contrast bolus timing. Review of the MIP images confirms the above findings IMPRESSION: There is no proximal intracranial vessel occlusion; specifically no proximal right PCA occlusion. Irregularity more distally with possible proximal P4 branch occlusion. No large vessel occlusion in the neck. Mild plaque at the left vertebral origin. Plaque at the ICA origins causes less than 50% stenosis. Bilateral pleural effusions. Electronically Signed   By: Macy Mis M.D.   On: 09/27/2020 19:56   DG Chest 2 View  Result Date: 09/29/2020 CLINICAL DATA:  Cough, weakness, dyspnea, code stroke EXAM: CHEST - 2 VIEW COMPARISON:  09/27/2020 FINDINGS: Normal heart size mediastinal contours. Bibasilar pleural effusions and atelectasis similar to prior study. Upper lungs clear. LEFT apex scarring stable. No new infiltrate or pneumothorax. Bones demineralized. IMPRESSION: Persistent bibasilar pleural effusions and atelectasis slightly greater on LEFT, unchanged. Electronically Signed   By: Lavonia Dana M.D.   On: 09/29/2020 13:38   DG Chest 2 View  Result Date: 09/27/2020 CLINICAL DATA:  Cough and shortness of breath. EXAM: CHEST - 2 VIEW  COMPARISON:  Chest x-ray dated Sep 04, 2017. FINDINGS: The heart size and mediastinal contours are within normal limits. Pulmonary vascular congestion. Small bilateral pleural effusions and bibasilar atelectasis. No pneumothorax. No acute osseous abnormality. IMPRESSION: 1. Pulmonary vascular congestion and small bilateral pleural effusions. Electronically Signed   By: Titus Dubin M.D.   On: 09/27/2020 16:12   CT Head Wo Contrast  Result Date: 09/27/2020 CLINICAL DATA:  Acute stroke suspected. Headache. Left vision changes. EXAM: CT HEAD WITHOUT CONTRAST TECHNIQUE: Contiguous axial images were obtained from the base of the skull through the vertex without intravenous contrast. COMPARISON:  None. FINDINGS: Brain: No subdural, epidural, or subarachnoid hemorrhage. There is an infarct in the right occipital lobe. No other sites of cortical ischemia or infarct identified. The occipital horn of the right lateral ventricle is compressed due to mass effect from the infarct. The ventricles are otherwise normal. No midline shift. Basal cisterns are patent although there is mild narrowing of the quadrigeminal plate cistern on the right due to the infarct. The cerebellum and brainstem are normal. Vascular: Calcified atherosclerosis is seen in the intracranial carotids. Skull: Normal. Negative for fracture or focal lesion. Sinuses/Orbits: No acute finding. Other: None. IMPRESSION: 1. Right occipital lobe infarct consistent with the patient's symptoms. The infarct exerts mild mass effect on the right side of the quadrigeminal plate cistern and significant mass effect on the occipital horn of the right lateral ventricle. There is no midline shift however. There is likely punctate hemorrhage scattered throughout the infarct. No large hematoma. 2. No other abnormalities. Electronically Signed   By: Dorise Bullion III M.D   On: 09/27/2020 19:38   CT Angio Neck W and/or Wo Contrast  Result Date: 09/27/2020 CLINICAL DATA:   Change in vision EXAM: CT ANGIOGRAPHY HEAD AND NECK TECHNIQUE: Multidetector CT imaging of the head and neck was performed using the standard protocol during bolus administration of intravenous contrast. Multiplanar CT image reconstructions and MIPs were obtained to evaluate the vascular anatomy. Carotid stenosis measurements (when applicable) are obtained utilizing NASCET criteria, using the distal internal carotid diameter as the denominator. CONTRAST:  5mL OMNIPAQUE IOHEXOL 350 MG/ML SOLN COMPARISON:  None. FINDINGS: CTA NECK Aortic arch: Great vessel origins are patent. Right carotid system: Patent.  Noncalcified plaque along the common carotid with less than 50% stenosis. Calcified plaque along the proximal internal carotid with less than 50% stenosis. Left carotid system: Patent. Mixed plaque at the internal carotid origin with less than 50% stenosis. Vertebral arteries: Patent and codominant. Mild plaque at the left vertebral origin. Skeleton: No significant osseous abnormality. Other neck: Unremarkable. Upper chest: Bilateral pleural effusions. Review of the MIP images confirms the above findings CTA HEAD Anterior circulation: Intracranial internal carotid arteries are patent with calcified plaque causing mild stenosis. Anterior and middle cerebral arteries are patent. Posterior circulation: Intracranial vertebral arteries are patent. Basilar artery is patent. Major cerebellar artery origins are patent. Left posterior cerebral artery is patent. No proximal right PCA occlusion. There is some right P3 and P4 irregularity with possible occlusion of a proximal P4 branch noted. Venous sinuses: Patent as allowed by contrast bolus timing. Review of the MIP images confirms the above findings IMPRESSION: There is no proximal intracranial vessel occlusion; specifically no proximal right PCA occlusion. Irregularity more distally with possible proximal P4 branch occlusion. No large vessel occlusion in the neck. Mild  plaque at the left vertebral origin. Plaque at the ICA origins causes less than 50% stenosis. Bilateral pleural effusions. Electronically Signed   By: Macy Mis M.D.   On: 09/27/2020 19:56   MR BRAIN WO CONTRAST  Result Date: 09/28/2020 CLINICAL DATA:  Initial evaluation for acute stroke, visual disturbance. EXAM: MRI HEAD WITHOUT CONTRAST TECHNIQUE: Multiplanar, multiecho pulse sequences of the brain and surrounding structures were obtained without intravenous contrast. COMPARISON:  Prior CTs from earlier the same day. FINDINGS: Brain: Cerebral volume within normal limits for age. Small remote lacunar infarct at the left thalamus. No other significant small vessel disease for age. Moderately large evolving acute to early subacute right PCA distribution infarct involving the right temporal occipital region is seen, corresponding with abnormality on prior CT (series 5, image 70). Extensive susceptibility artifact seen throughout the area of infarction, consistent with associated hemorrhage. No frank intraparenchymal hematoma. Trace blood products seen layering within the occipital horns of both lateral ventricles, likely related to redistribution of small volume associated subarachnoid blood (series 14, image 28). No significant midline shift or regional mass effect. No hydrocephalus or trapping. No other evidence for acute or subacute ischemia. Gray-white matter differentiation otherwise maintained. No other areas of remote cortical infarction. No other evidence for acute or chronic intracranial hemorrhage. No mass lesion, midline shift or mass effect. No extra-axial fluid collection. Pituitary gland suprasellar region normal. Midline structures intact. Vascular: Major intracranial vascular flow voids are maintained. Skull and upper cervical spine: Craniocervical junction within normal limits. Bone marrow signal intensity normal. No scalp soft tissue abnormality. Sinuses/Orbits: Globes and orbital soft  tissues within normal limits. Mild scattered mucosal thickening noted within the ethmoidal air cells and maxillary sinuses. Paranasal sinuses are otherwise clear. No significant mastoid effusion. Inner ear structures grossly normal. Other: None. IMPRESSION: 1. Moderately large evolving acute to early subacute hemorrhagic right PCA distribution infarct. No significant mass effect. 2. Trace blood products layering within the occipital horns of both lateral ventricles, likely related to redistribution of small volume associated subarachnoid blood. No hydrocephalus or trapping. 3. Small remote left thalamic lacunar infarct. Electronically Signed   By: Jeannine Boga M.D.   On: 09/28/2020 00:45   ECHOCARDIOGRAM COMPLETE  Result Date: 09/28/2020    ECHOCARDIOGRAM REPORT   Patient Name:   Greg Robertson Date of Exam: 09/28/2020 Medical Rec #:  [id number]  Height:       75.0 in Accession #:    [id number]         Weight:       172.0 lb Date of Birth:  Feb 05, 1951          BSA:          2.058 m Patient Age:    6 years           BP:           126/82 mmHg Patient Gender: M                  HR:           95 bpm. Exam Location:  Inpatient Procedure: 2D Echo, Cardiac Doppler and Color Doppler Indications:    Stroke  History:        Patient has prior history of Echocardiogram examinations, most                 recent 03/11/2012. Risk Factors:Former Smoker and Diabetes.  Sonographer:    Cammy Brochure Referring Phys: [place]  1. Left ventricular ejection fraction, by estimation, is 30 to 35%. The left ventricle has moderately decreased function. The left ventricle demonstrates global hypokinesis. Left ventricular diastolic parameters are consistent with Grade II diastolic dysfunction (pseudonormalization). Elevated left atrial pressure.  2. LV apex not well visualized. In setting of CVA with LV systolic dysfunction, would consider repeat limited echo with contrast to rule out apical  thrombus  3. Right ventricular systolic function is normal. The right ventricular size is normal. Tricuspid regurgitation signal is inadequate for assessing PA pressure.  4. The mitral valve is normal in structure. No evidence of mitral valve regurgitation. No evidence of mitral stenosis.  5. The aortic valve was not well visualized. Aortic valve regurgitation is not visualized. No aortic stenosis is present.  6. The inferior vena cava is normal in size with greater than 50% respiratory variability, suggesting right atrial pressure of 3 mmHg. FINDINGS  Left Ventricle: Left ventricular ejection fraction, by estimation, is 30 to 35%. The left ventricle has moderately decreased function. The left ventricle demonstrates global hypokinesis. The left ventricular internal cavity size was normal in size. There is no left ventricular hypertrophy. Left ventricular diastolic parameters are consistent with Grade II diastolic dysfunction (pseudonormalization). Elevated left atrial pressure. Right Ventricle: The right ventricular size is normal. No increase in right ventricular wall thickness. Right ventricular systolic function is normal. Tricuspid regurgitation signal is inadequate for assessing PA pressure. Left Atrium: Left atrial size was normal in size. Right Atrium: Right atrial size was normal in size. Pericardium: There is no evidence of pericardial effusion. Mitral Valve: The mitral valve is normal in structure. No evidence of mitral valve regurgitation. No evidence of mitral valve stenosis. Tricuspid Valve: The tricuspid valve is normal in structure. Tricuspid valve regurgitation is not demonstrated. Aortic Valve: The aortic valve was not well visualized. Aortic valve regurgitation is not visualized. No aortic stenosis is present. Aortic valve mean gradient measures 2.5 mmHg. Aortic valve peak gradient measures 4.0 mmHg. Aortic valve area, by VTI measures 2.10 cm. Pulmonic Valve: The pulmonic valve was not well  visualized. Pulmonic valve regurgitation is not visualized. Aorta: The aortic root and ascending aorta are structurally normal, with no evidence of dilitation. Venous: The inferior vena cava is normal in size with greater than 50% respiratory variability, suggesting right atrial pressure of 3 mmHg. IAS/Shunts: The interatrial septum was not  well visualized. Additional Comments: There is a small pleural effusion in the left lateral region.  LEFT VENTRICLE PLAX 2D LVIDd:         5.30 cm  Diastology LVIDs:         3.80 cm  LV e' medial:    5.98 cm/s LV PW:         1.20 cm  LV E/e' medial:  16.3 LV IVS:        0.90 cm  LV e' lateral:   8.38 cm/s LVOT diam:     2.15 cm  LV E/e' lateral: 11.6 LV SV:         39 LV SV Index:   19 LVOT Area:     3.63 cm  RIGHT VENTRICLE             IVC RV Basal diam:  3.60 cm     IVC diam: 1.60 cm RV S prime:     11.10 cm/s TAPSE (M-mode): 1.8 cm LEFT ATRIUM             Index       RIGHT ATRIUM           Index LA diam:        3.60 cm 1.75 cm/m  RA Area:     15.10 cm LA Vol (A2C):   60.1 ml 29.20 ml/m RA Volume:   46.00 ml  22.35 ml/m LA Vol (A4C):   56.6 ml 27.50 ml/m LA Biplane Vol: 59.3 ml 28.81 ml/m  AORTIC VALVE AV Area (Vmax):    2.28 cm AV Area (Vmean):   2.01 cm AV Area (VTI):     2.10 cm AV Vmax:           100.45 cm/s AV Vmean:          75.450 cm/s AV VTI:            0.186 m AV Peak Grad:      4.0 mmHg AV Mean Grad:      2.5 mmHg LVOT Vmax:         63.10 cm/s LVOT Vmean:        41.800 cm/s LVOT VTI:          0.108 m LVOT/AV VTI ratio: 0.58  AORTA Ao Root diam: 3.05 cm Ao Asc diam:  2.65 cm MITRAL VALVE MV Area (PHT): 7.44 cm    SHUNTS MV Decel Time: 102 msec    Systemic VTI:  0.11 m MV E velocity: 97.30 cm/s  Systemic Diam: 2.15 cm MV A velocity: 42.80 cm/s MV E/A ratio:  2.27 Oswaldo Milian MD Electronically signed by Oswaldo Milian MD Signature Date/Time: 09/28/2020/11:17:35 AM    Final    ECHOCARDIOGRAM LIMITED  Result Date: 09/30/2020    ECHOCARDIOGRAM  LIMITED REPORT   Patient Name:   Greg Robertson Date of Exam: 09/30/2020 Medical Rec #:  [id number]          Height:       75.0 in Accession #:    [id number]         Weight:       172.0 lb Date of Birth:  August 09, 1950          BSA:          2.058 m Patient Age:    30 years           BP:           114/81 mmHg Patient Gender: M  HR:           95 bpm. Exam Location:  Inpatient Procedure: Limited Echo and Intracardiac Opacification Agent Indications:    Other abnormalities of the heart R00.8  History:        Patient has prior history of Echocardiogram examinations, most                 recent 09/28/2020. Arrythmias:Atrial Fibrillation; Risk                 Factors:Hypertension and Diabetes. Substance abuse.  Sonographer:    Jonelle Sidle Dance Referring Phys: [place]  1. Limited study for contrast. Left ventricular ejection fraction, by estimation, is 30 to 35%. The left ventricle has moderately decreased function. The left ventricle demonstrates global hypokinesis. There is no evidence of LV thrombus. Comparison(s): A prior study was performed on 09/28/20. No significant change from prior study. Prior images reviewed side by side. FINDINGS  Left Ventricle: Left ventricular ejection fraction, by estimation, is 30 to 35%. The left ventricle has moderately decreased function. The left ventricle demonstrates global hypokinesis. Definity contrast agent was given IV to delineate the left ventricular endocardial borders. Rudean Haskell MD Electronically signed by Rudean Haskell MD Signature Date/Time: 09/30/2020/12:03:29 PM    Final     Assessment/Plan  1. Acute ischemic stroke ([place]) -   Will need an ischemic evaluation as an outpatient, will consult cardiology -   Continue atorvastatin 40 mg daily -    follow-up with neurology in 1 month  2. Acute on chronic diastolic CHF (congestive heart failure) (HCC) -    continue Lasix 20 mg daily -    will not start Entresto at  this time as it might worsen the stroke -    follow-up with cardiology on 10/07/2020   3. Type 1 diabetes mellitus with other circulatory complication (HCC) Lab Results  Component Value Date   HGBA1C 8.7 (H) 09/28/2020   -   Continue insulin aspart 5 units 3 times daily before meals for CBG >= 200 and Tresiba 30 units SQ in the morning  4. Paroxysmal atrial fibrillation (HCC) -    rate controlled, will continue aspirin -    not a candidate for anticoagulation at this time due to his large subacute hemorrhagic infarct -Will not start beta-blocker at this time as it might worsen the stroke  5. AKI (acute kidney injury) [facility]) Lab Results  Component Value Date   NA 132 (L) 10/01/2020   K 4.4 10/01/2020   CO2 23 10/01/2020   GLUCOSE 216 (H) 10/01/2020   BUN 21 10/01/2020   CREATININE 1.41 (H) 10/01/2020   CALCIUM 8.9 10/01/2020   GFRNONAA 54 (L) 10/01/2020   GFRAA 72 04/02/2020   -   Now at baseline      Family/ staff Communication:   Discussed plan of care with resident and charge nurse.  Labs/tests ordered: None  Goals of care:   Short-term care   Durenda Age, DNP, MSN, FNP-BC [facility] [phone] (Monday-Friday 8:00 a.m. - 5:00 p.m.) [phone] (after hours)

## 2020-10-05 ENCOUNTER — Non-Acute Institutional Stay (SKILLED_NURSING_FACILITY): Payer: Medicare Other | Admitting: Internal Medicine

## 2020-10-05 ENCOUNTER — Encounter: Payer: Self-pay | Admitting: Internal Medicine

## 2020-10-05 DIAGNOSIS — E1059 Type 1 diabetes mellitus with other circulatory complications: Secondary | ICD-10-CM

## 2020-10-05 DIAGNOSIS — N179 Acute kidney failure, unspecified: Secondary | ICD-10-CM | POA: Diagnosis not present

## 2020-10-05 DIAGNOSIS — I480 Paroxysmal atrial fibrillation: Secondary | ICD-10-CM | POA: Diagnosis not present

## 2020-10-05 DIAGNOSIS — I639 Cerebral infarction, unspecified: Secondary | ICD-10-CM | POA: Diagnosis not present

## 2020-10-05 DIAGNOSIS — H53462 Homonymous bilateral field defects, left side: Secondary | ICD-10-CM

## 2020-10-05 DIAGNOSIS — I5033 Acute on chronic diastolic (congestive) heart failure: Secondary | ICD-10-CM | POA: Diagnosis not present

## 2020-10-05 DIAGNOSIS — I152 Hypertension secondary to endocrine disorders: Secondary | ICD-10-CM | POA: Diagnosis not present

## 2020-10-05 DIAGNOSIS — E1159 Type 2 diabetes mellitus with other circulatory complications: Secondary | ICD-10-CM

## 2020-10-05 NOTE — Assessment & Plan Note (Addendum)
  A1c 8.7% w 34% increased CVA risk; risk discussed The [facility] will be titrated down once average glucoses are in the 250 range.  Until that time 30 units will be continued.  He will receive short acting insulin 5 units of NovoLog if the AC glucose is 200-299 and 10 units if it is over 300. He will keep diary

## 2020-10-05 NOTE — Progress Notes (Signed)
NURSING HOME LOCATION:  [facility] ROOM NUMBER:  [id number]  CODE STATUS:  Full Code  PCP:  Caryl Pina MD  This is a comprehensive admission note to this SNFperformed on this date less than 30 days from date of admission. Included are preadmission medical/surgical history; reconciled medication list; family history; social history and comprehensive review of systems.  Corrections and additions to the records were documented. Comprehensive physical exam was also performed. Additionally a clinical summary was entered for each active diagnosis pertinent to this admission in the Problem List to enhance continuity of care.  HPI: He was hospitalized 6/12 - 10/01/2020 with acute ischemic stroke.  He presented with 1 day history of sudden onset of left homonymous hemianopsia which persisted.  This was in the context of past medical history of A. fib not on anticoagulation, poorly controlled diabetes mellitus type 1, history of smoking and dyslipidemia. In the ED CT scan of the head revealed a right occipital stroke with mild mass-effect on the cistern and occipital right lateral horn.  MRI revealed subacute hemorrhagic right PCA distribution infarct with small volume of blood in the occipital horns as well as a small remote left thalamic lacunar infarct.  Neurology recommended continuing aspirin rather than anticoagulant due to the large size of the stroke.  Plan was to repeat the CT in approximately 14 days and consider Pradaxa if there were improvement in the volume of the stroke and no frank blood. His diabetes was poorly controlled as documented by an A1c of 8.7 %. SSI was initiated. Course was complicated by acute on chronic systolic and diastolic heart failure.  Echo 6/13 revealed an EF of 30% with  grade 2 diastolic heart failure.  Blood pressure did stabilize and beta-blocker and Entresto were not initiated for fear of worsening the stroke picture.  Low-dose Lasix was  initiated predischarge as well as carvedilol with planned titration. He has a history of PAF but exhibited sinus tachycardia during the hospitalization. AKI was present but creatinine returned to baseline allowing the initiation of low-dose Lasix.  Past medical and surgical history: Includes past medical history of drug-induced neutropenia, history of chronic bronchitis, history of malignant neoplasm of ascending colon, aortic atherosclerosis, allergic rhinitis, glaucoma,and history of substance abuse (marijuana). Surgeries and procedures include laparoscopic assisted ileocolectomy for adenocarcinoma of the colon, nasal fracture surgery, and finger surgery.  Social history: Social alcohol intake; history of marijuana use; Former 15-pack-year smoker. Family history: Reviewed; no family history of stroke.   Review of systems: He describes himself as a brittle diabetic and states that when he was taking Tresiba > 14 units at home he was having significant hypoglycemia.  He stated that at dose's of 25 units glucoses could drop to the 30-50s.  He is using the [facility] and his most recent glucoses have ranged from 288-394.  He said at home he was counting carbs and would take 1 unit of short acting insulin for every 6 g of carbohydrate intake. He denies associated neuropathic symptoms or polyuria, polydipsia, polyphagia. He states that he does snore at times but denies any apnea.  He does have chronic insomnia. He states that he does tend to sleep sitting up because of some dyspnea suggestive of paroxysmal nocturnal dyspnea.  He also describes some edema.  Constitutional: No fever, significant weight change  Eyes: No redness, discharge, pain ENT/mouth: No nasal congestion, purulent discharge, earache, change in hearing, sore throat  Cardiovascular: No chest pain, palpitations, claudication Respiratory:  No cough, sputum production, hemoptysis, DOE Gastrointestinal: No heartburn, dysphagia,  abdominal pain, nausea /vomiting, rectal bleeding, melena, change in bowels Genitourinary: No dysuria, hematuria, pyuria, incontinence, nocturia Musculoskeletal: No joint stiffness, joint swelling, weakness, pain Dermatologic: No rash, pruritus, change in appearance of skin Neurologic: No dizziness, headache, syncope, seizures, numbness, tingling Psychiatric: No significant anxiety, depression,anorexia Endocrine: No change in hair/skin/nails Hematologic/lymphatic: No significant bruising, lymphadenopathy, abnormal bleeding Allergy/immunology: No itchy/watery eyes, significant sneezing, urticaria, angioedema  Physical exam:  Pertinent or positive findings: Pattern alopecia is present.  He has a full beard and mustache.  There is slight ptosis of the left eye.  Field of vision is essentially intact without demonstrable homonymous hemianopsia clinically. A slight gallop is present.  Breath sounds are slightly bronchovesicular in character. Pedal pulses are decreased.  He has trace pedal edema.  Strength to opposition is extremely good and symmetric. Bruising present LLQ.  General appearance: Adequately nourished; no acute distress, increased work of breathing is present.   Lymphatic: No lymphadenopathy about the head, neck, axilla. Eyes: No conjunctival inflammation or lid edema is present. There is no scleral icterus. Ears:  External ear exam shows no significant lesions or deformities.   Nose:  External nasal examination shows no deformity or inflammation. Nasal mucosa are pink and moist without lesions, exudates Oral exam: Lips and gums are healthy appearing.There is no oropharyngeal erythema or exudate. Neck:  No thyromegaly, masses, tenderness noted.    Heart:  Normal rate and regular rhythm. S1 and S2 normal without murmur, click, rub.  Lungs: Chest clear to auscultation without wheezes, rhonchi, rales, rubs. Abdomen: Bowel sounds are normal.  Abdomen is soft and nontender with no  organomegaly, hernias, masses. GU: Deferred  Extremities:  No cyanosis, clubbing. Neurologic exam: Balance, Rhomberg, finger to nose testing could not be completed due to clinical state Skin: Warm & dry w/o tenting. No significant lesions or rash.  See clinical summary under each active problem in the Problem List with associated updated therapeutic plan

## 2020-10-05 NOTE — Assessment & Plan Note (Signed)
Medication List reviewed; no nephrotoxic agents identified.

## 2020-10-05 NOTE — Assessment & Plan Note (Signed)
Slight gallop cadence but clinically NSR

## 2020-10-05 NOTE — Assessment & Plan Note (Signed)
  Plan is to repeat CT in approximately 2 weeks and consider Pradaxa if there is improvement in the volume and no frank bleeding.

## 2020-10-05 NOTE — Assessment & Plan Note (Signed)
Clinically compensated as he has only trace edema and no neck vein distention or HJR.

## 2020-10-05 NOTE — Patient Instructions (Signed)
See assessment and plan under each diagnosis in the problem list and acutely for this visit 

## 2020-10-05 NOTE — Assessment & Plan Note (Addendum)
10/05/2020 field of vision is grossly intact which suggests that the stroke is decreasing in volume.  Follow-up CT in approximately 10 days.

## 2020-10-05 NOTE — Assessment & Plan Note (Signed)
BP controlled; no change in antihypertensive medications Coreg titration as per Cardiology.

## 2020-10-06 ENCOUNTER — Telehealth (HOSPITAL_COMMUNITY): Payer: Self-pay | Admitting: Surgery

## 2020-10-06 NOTE — Progress Notes (Signed)
[facility]  PCP: Dettinger, Vonna Kotyk Primary Cardiologist: Minus Breeding  HPI:  Greg Robertson is a 70 y.o.  male  with a PMH significant for paroxysmal atrial fibrillation not on AC, aortic atherosclerosis, hypertension, hyperlipidemia, type 1 diabetes, CVA, history of right central retinal artery occlusion, colon cancer s/p right colectomy 06/02/2011 and adjuvant FOLFOX chemotherapy 01/05/12, recent admission 09/2020 for acute right PCA CVA  01/2012 admitted for DKA, developed afib and RBBB.  Had an ECHO with normal LVEF 55%, G1DD, normally sized atria.  He self converted to NSR.  Discharged on Apixaban 5 mg bid was started as well as dronedarone 400 mg bid, and Toprol 50 mg daily.  Limited Cardiology follow up did have a nuclear stress test which showed a small fixed apical defect but was negative for reversible ischemia.    09/27/20 patient admitted with acute R PCA stroke with extensive hemorrhagic conversion.  This was thought to be cardioembolic 2/2 afib since patient was not on [facility]. Echocardiogram from 09/28/2020 revealed EF 30 to 35%, LV moderately decreased function, LV global hypokinesis, grade 2 DD, LV apex not well, visualized, elevated left atrial pressure, RV normal, no significant valvular disease.  Discharged on carvedilol 3.125 BID, asa 81, lasix 20.      Spent the initial portion of the visit perseverating on his insulin regimen, difficult to get to how he's been feeling.  He did endorse that he has no dyspnea on exertion although uses a walker and goes fairly slow.  Currently at a rehab facility.  He denies chest pain, orthopnea or PND.     ROS: All systems negative except as listed in HPI, PMH and Problem List.  SH:  Social History   Socioeconomic History   Marital status: Single    Spouse name: Not on file   Number of children: 0   Years of education: 16   Highest education level: Bachelor's degree (e.g., BA, AB, BS)   Occupational History   Occupation: Retired    Comment: Press photographer  Tobacco Use   Smoking status: Former    Packs/day: 1.00    Years: 15.00    Pack years: 15.00    Types: Cigarettes    Quit date: 04/18/1993    Years since quitting: 27.4   Smokeless tobacco: Never   Tobacco comments:    marijuana every night   Vaping Use   Vaping Use: Never used  Substance and Sexual Activity   Alcohol use: Yes    Alcohol/week: 5.0 standard drinks    Types: 3 Cans of beer, 2 Shots of liquor per week    Comment: 1 drink every 2 days   Drug use: Yes    Types: Marijuana    Comment: once a night marijuana   Sexual activity: Not Currently  Other Topics Concern   Not on file  Social History Narrative   Lives alone.  Retired from [employer].    Social Determinants of Health   Financial Resource Strain: Low Risk    Difficulty of Paying Living Expenses: Not hard at all  Food Insecurity: No Food Insecurity   Worried About Charity fundraiser in the Last Year: Never true   [place] in the Last Year: Never true  Transportation Needs: No Transportation Needs   Lack of Transportation (Medical): No   Lack of Transportation (Non-Medical): No  Physical Activity: Sufficiently Active   Days of Exercise per Week: 5 days   Minutes  of Exercise per Session: 40 min  Stress: No Stress Concern Present   Feeling of Stress : Only a little  Social Connections: Moderately Isolated   Frequency of Communication with Friends and Family: More than three times a week   Frequency of Social Gatherings with Friends and Family: Three times a week   Attends Religious Services: 1 to 4 times per year   Active Member of Clubs or Organizations: No   Attends Archivist Meetings: Never   Marital Status: Never married  Human resources officer Violence: Not At Risk   Fear of Current or Ex-Partner: No   Emotionally Abused: No   Physically Abused: No   Sexually Abused: No    FH:  Family History  Problem Relation  Age of Onset   Colon polyps Father    Lung cancer Father    Diabetes Father    Prostate cancer Father    Breast cancer Mother    Pancreatitis Mother        intestinal adhesions   Insomnia Mother    Colon cancer Neg Hx    Rectal cancer Neg Hx     Past Medical History:  Diagnosis Date   Allergic rhinitis    Anemia    Atrial fibrillation ([place])    Persistent   Cancer of ascending colon, 7cm 05/25/2011   Diabetes mellitus type I ([place])    Glaucoma    Hypertension    Pneumonia 1979 or 1980   Prostate nodule    Substance abuse ([place])     Current Outpatient Medications  Medication Sig Dispense Refill   acetaminophen (TYLENOL) 500 MG tablet Take 1,000 mg by mouth every 6 (six) hours as needed for mild pain or headache. Pain     Ascorbic Acid (VITAMIN C PO) Take 1 tablet by mouth every morning.      aspirin 81 MG EC tablet Take 1 tablet (81 mg total) by mouth daily. Swallow whole. 30 tablet 11   atorvastatin (LIPITOR) 40 MG tablet Take 1 tablet (40 mg total) by mouth daily. 30 tablet 3   carvedilol (COREG) 3.125 MG tablet Take 1 tablet (3.125 mg total) by mouth 2 (two) times daily. 60 tablet 11   Continuous Blood Gluc Receiver (FREESTYLE LIBRE 2 READER) DEVI USE IN THE MORNING, AT NOON, IN THE EVENING, AND AT BEDTIME AS DIRECTED Dx E10.9 1 each 1   Continuous Blood Gluc Sensor (FREESTYLE LIBRE 2 SENSOR) MISC 1 each by Does not apply route every 14 (fourteen) days 6 each 3   furosemide (LASIX) 20 MG tablet Take 1 tablet (20 mg total) by mouth daily. 30 tablet 0   glucose blood test strip Use as instructed 100 each 12   insulin aspart (NOVOLOG) 100 UNIT/ML injection INJECT 8-15 UNITS THREE TIMES DAILY BEFORE MEALS (Patient taking differently: INJECT 5 UNITS THREE TIMES DAILY BEFORE MEALS for CBG greater than or equal to 200. Notify provider if CBG is 400.) 10 mL 3   insulin degludec (TRESIBA FLEXTOUCH) 100 UNIT/ML FlexTouch Pen Inject 30 Units into the skin every morning. 15 mL 3   Insulin  Pen Needle 32G X 4 MM MISC Use as directed 100 each 0   levocetirizine (XYZAL) 5 MG tablet Take 1 tablet (5 mg total) by mouth daily. 90 tablet 3   Multiple Vitamin (MULTIVITAMIN) tablet Take 1 tablet by mouth every morning.      Tafluprost, PF, (ZIOPTAN) 0.0015 % SOLN Place 1 drop into both eyes at bedtime.  triamcinolone (NASACORT) 55 MCG/ACT AERO nasal inhaler INSTILL 1 SPRAY IN EACH NOSTRIL ONCE OR TWICE DAILY AS NEEDED. 16.5 Bottle 11   No current facility-administered medications for this visit.    There were no vitals filed for this visit.  PHYSICAL EXAM: Cardiac: JVD flat, normal rate and rhythm, clear s1 and s2, no murmurs, rubs or gallops, no LE edema Pulmonary: CTAB, not in distress Abdominal: non distended abdomen, soft and nontender Psych: Alert, conversant, in good spirits    ECG  NSR, rate 88  ASSESSMENT & PLAN: Chronic Systolic and Diastolic CHF: -newly diagnosed during CVA workup, uncertain etiology. Daily alcohol use, HTN  -01/2012 admitted for DKA, developed afib and RBBB.  Had an ECHO with normal LVEF 55%, G1DD, normally sized atria -2014 nuclear stress test with -Echocardiogram from 09/28/2020 revealed EF 30 to 35%, LV moderately decreased function, LV global hypokinesis, grade 2 DD, LV apex not well, visualized, elevated left atrial pressure, RV normal, no significant valvular disease.   -plans for ischemic workup outpatient as he is high risk for CAD -NYHA Class II, euvolemic on exam -Continue carvedilol 3.125 BID -Switch lasix 20mg  daily to PRN, add spiro 12.5mg   HTN: -well controlled currently on above GDMT  PAF: -CHADS2VASC score of 6 -remains in NSR, continue carvedilol  -currently on asa due to hemorrhagic conversion of likely embolic CVA  -neurology managing, plan is for repeat imaging and starting pradaxa in 2 weeks.  Will message them as I do not see this ordered  T1DM: -managed with insulin, no sglt2i   Follow up with [facility] ,   [facility]

## 2020-10-06 NOTE — Telephone Encounter (Signed)
I called patient to remind him of [facility] Appt and provide directions to clinic if necessary.  I spoke with his son as he is currently in SNF.  His son tells me that he will call back and let me know if patient can make it to the appt.given he remains at SNF at this time.

## 2020-10-07 ENCOUNTER — Other Ambulatory Visit: Payer: Self-pay

## 2020-10-07 ENCOUNTER — Encounter (HOSPITAL_COMMUNITY): Payer: Self-pay

## 2020-10-07 ENCOUNTER — Ambulatory Visit (HOSPITAL_COMMUNITY)
Admit: 2020-10-07 | Discharge: 2020-10-07 | Disposition: A | Discharge status: Home / Self Care | Payer: Medicare Other | Attending: Internal Medicine | Admitting: Internal Medicine

## 2020-10-07 VITALS — BP 112/70 | HR 88 | Wt 172.4 lb

## 2020-10-07 DIAGNOSIS — E109 Type 1 diabetes mellitus without complications: Secondary | ICD-10-CM | POA: Diagnosis not present

## 2020-10-07 DIAGNOSIS — Z79899 Other long term (current) drug therapy: Secondary | ICD-10-CM | POA: Diagnosis not present

## 2020-10-07 DIAGNOSIS — E1059 Type 1 diabetes mellitus with other circulatory complications: Secondary | ICD-10-CM | POA: Diagnosis not present

## 2020-10-07 DIAGNOSIS — I5042 Chronic combined systolic (congestive) and diastolic (congestive) heart failure: Secondary | ICD-10-CM | POA: Diagnosis not present

## 2020-10-07 DIAGNOSIS — Z87891 Personal history of nicotine dependence: Secondary | ICD-10-CM | POA: Diagnosis not present

## 2020-10-07 DIAGNOSIS — E785 Hyperlipidemia, unspecified: Secondary | ICD-10-CM | POA: Diagnosis not present

## 2020-10-07 DIAGNOSIS — F1290 Cannabis use, unspecified, uncomplicated: Secondary | ICD-10-CM | POA: Diagnosis not present

## 2020-10-07 DIAGNOSIS — Z794 Long term (current) use of insulin: Secondary | ICD-10-CM | POA: Insufficient documentation

## 2020-10-07 DIAGNOSIS — I700 Atherosclerosis of aorta: Secondary | ICD-10-CM | POA: Insufficient documentation

## 2020-10-07 DIAGNOSIS — Z9221 Personal history of antineoplastic chemotherapy: Secondary | ICD-10-CM | POA: Diagnosis not present

## 2020-10-07 DIAGNOSIS — I10 Essential (primary) hypertension: Secondary | ICD-10-CM

## 2020-10-07 DIAGNOSIS — Z85038 Personal history of other malignant neoplasm of large intestine: Secondary | ICD-10-CM | POA: Diagnosis not present

## 2020-10-07 DIAGNOSIS — I110 Hypertensive heart disease with heart failure: Secondary | ICD-10-CM | POA: Insufficient documentation

## 2020-10-07 DIAGNOSIS — I480 Paroxysmal atrial fibrillation: Secondary | ICD-10-CM

## 2020-10-07 DIAGNOSIS — Z7982 Long term (current) use of aspirin: Secondary | ICD-10-CM | POA: Diagnosis not present

## 2020-10-07 DIAGNOSIS — I4510 Unspecified right bundle-branch block: Secondary | ICD-10-CM | POA: Insufficient documentation

## 2020-10-07 LAB — BASIC METABOLIC PANEL
Anion gap: 9 (ref 5–15)
BUN: 18 mg/dL (ref 8–23)
CO2: 25 mmol/L (ref 22–32)
Calcium: 9.1 mg/dL (ref 8.9–10.3)
Chloride: 100 mmol/L (ref 98–111)
Creatinine, Ser: 1.3 mg/dL — ABNORMAL HIGH (ref 0.61–1.24)
GFR, Estimated: 59 mL/min — ABNORMAL LOW (ref >60)
Glucose, Bld: 161 mg/dL — ABNORMAL HIGH (ref 70–99)
Potassium: 4.6 mmol/L (ref 3.5–5.1)
Sodium: 134 mmol/L — ABNORMAL LOW (ref 135–145)

## 2020-10-07 MED ORDER — FUROSEMIDE 20 MG PO TABS: 20.0000 mg | ORAL_TABLET | Freq: Every day | ORAL | 5 refills | Status: DC | PRN

## 2020-10-07 MED ORDER — SPIRONOLACTONE 25 MG PO TABS
12.5000 mg | ORAL_TABLET | Freq: Every day | ORAL | 3 refills | Status: DC
Start: 2020-10-07 — End: 2021-12-17

## 2020-10-07 NOTE — Patient Instructions (Signed)
EKG done today.  Labs done today. We will contact you only if your labs are abnormal.  START Spironolactone 12.5mg  (1/2 tablet) by mouth daily.  DECREASE Lasix to 20mg  (1 tablet) by mouth daily as needed for swelling or a weight gain greater than 3 pounds or more in 24 hours.  No other medication changes were made. Please continue all current medications as prescribed.  Your physician request that you have repeat labs done in 1 week (can be done at facility)

## 2020-10-08 ENCOUNTER — Encounter (HOSPITAL_COMMUNITY): Payer: Self-pay

## 2020-10-12 ENCOUNTER — Inpatient Hospital Stay: Payer: Medicare Other

## 2020-10-12 ENCOUNTER — Other Ambulatory Visit: Payer: Self-pay

## 2020-10-12 ENCOUNTER — Other Ambulatory Visit: Payer: Medicare Other

## 2020-10-12 ENCOUNTER — Ambulatory Visit: Payer: Medicare Other | Attending: Internal Medicine

## 2020-10-12 ENCOUNTER — Other Ambulatory Visit (HOSPITAL_BASED_OUTPATIENT_CLINIC_OR_DEPARTMENT_OTHER): Payer: Self-pay

## 2020-10-12 ENCOUNTER — Other Ambulatory Visit: Payer: Self-pay | Admitting: Adult Health

## 2020-10-12 ENCOUNTER — Inpatient Hospital Stay: Payer: Medicare Other | Attending: Nurse Practitioner | Admitting: Oncology

## 2020-10-12 ENCOUNTER — Telehealth: Payer: Self-pay

## 2020-10-12 VITALS — BP 129/81 | HR 96 | Temp 97.8°F | Resp 18 | Ht 75.0 in | Wt 172.0 lb

## 2020-10-12 DIAGNOSIS — Z1509 Genetic susceptibility to other malignant neoplasm: Secondary | ICD-10-CM | POA: Diagnosis not present

## 2020-10-12 DIAGNOSIS — I509 Heart failure, unspecified: Secondary | ICD-10-CM | POA: Diagnosis not present

## 2020-10-12 DIAGNOSIS — I63431 Cerebral infarction due to embolism of right posterior cerebral artery: Secondary | ICD-10-CM

## 2020-10-12 DIAGNOSIS — C182 Malignant neoplasm of ascending colon: Secondary | ICD-10-CM | POA: Diagnosis not present

## 2020-10-12 DIAGNOSIS — Z23 Encounter for immunization: Secondary | ICD-10-CM

## 2020-10-12 DIAGNOSIS — Z8673 Personal history of transient ischemic attack (TIA), and cerebral infarction without residual deficits: Secondary | ICD-10-CM | POA: Insufficient documentation

## 2020-10-12 DIAGNOSIS — Z7982 Long term (current) use of aspirin: Secondary | ICD-10-CM | POA: Insufficient documentation

## 2020-10-12 LAB — CEA (ACCESS): CEA (CHCC): 5.59 ng/mL — ABNORMAL HIGH (ref 0.00–5.00)

## 2020-10-12 MED ORDER — COVID-19 MRNA VACC (MODERNA) 100 MCG/0.5ML IM SUSP
INTRAMUSCULAR | 0 refills | Status: DC
  Filled 2020-10-12: qty 0.25, 1d supply, fill #0

## 2020-10-12 NOTE — Telephone Encounter (Signed)
Tried to reach pt by phone line rings busy x2 will mail letter

## 2020-10-12 NOTE — Progress Notes (Signed)
[facility] OFFICE PROGRESS NOTE   Diagnosis: Colon cancer, hereditary nonpropulsive colon cancer syndrome  INTERVAL HISTORY:   Greg Robertson returns for a scheduled visit.  He had a CVA earlier this month was also diagnosed with congestive heart failure.  He is taking aspirin and is scheduled for neurology follow-up.  He is recovering in a rehabilitation facility.  He has a loss of left-sided vision. He reports feeling well prior to the CVA.  Objective:  Vital signs in last 24 hours:  Blood pressure 129/81, pulse 96, temperature 97.8 F (36.6 C), temperature source Oral, resp. rate 18, height 6' 3"  (1.905 m), weight 172 lb (78 kg), SpO2 100 %.    Lymphatics: No cervical, supraclavicular, axillary, or inguinal nodes Resp: Lungs clear bilaterally Cardio: Regular rate and rhythm GI: No mass, nontender, no hepatosplenomegaly Vascular: No leg edema, chronic discoloration at the pretibial areas bilaterally Neuro: The visual fields are grossly intact    Lab Results:  Lab Results  Component Value Date   WBC 8.6 09/27/2020   HGB 17.0 09/27/2020   HCT 50.0 09/27/2020   MCV 92.9 09/27/2020   PLT 148 (L) 09/27/2020   NEUTROABS 2.9 08/24/2020    CMP  Lab Results  Component Value Date   NA 134 (L) 10/07/2020   K 4.6 10/07/2020   CL 100 10/07/2020   CO2 25 10/07/2020   GLUCOSE 161 (H) 10/07/2020   BUN 18 10/07/2020   CREATININE 1.30 (H) 10/07/2020   CALCIUM 9.1 10/07/2020   PROT 6.0 08/24/2020   ALBUMIN 4.0 08/24/2020   AST 23 08/24/2020   ALT 19 08/24/2020   ALKPHOS 146 (H) 08/24/2020   BILITOT 0.3 08/24/2020   GFRNONAA 59 (L) 10/07/2020   GFRAA 72 04/02/2020    Lab Results  Component Value Date   CEA1 5.27 (H) 10/14/2019     Medications: I have reviewed the patient's current medications.   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 Tumor  microsatellite unstable, K-ras wild type. Adjuvant FOLFOX chemotherapy initiated on 07/14/2011. Cycle 12 given on 01/05/2012.   -Negative surveillance colonoscopy 06/06/2012   -Negative surveillance CT scans 04/25/2013 -Negative surveillance CT scans 07/21/2014 -Negative surveillance colonoscopy 09/10/2014 -Negative surveillance colonoscopy 01/16/2018 Microcytic anemia. Likely secondary to iron deficiency. Resolved. Insulin-dependent diabetes. Glaucoma. 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 He has seen a Retail buyer. He has been contacted with updated information regarding screening recommendations. Status post Port-A-Cath placement 07/01/2011. The Port-A-Cath has been removed. History of thrombocytopenia secondary to chemotherapy. Oxaliplatin was held with cycle 3. Oxaliplatin was resumed with cycle 4 at a 25% dose reduction. Oxaliplatin was further dose reduced beginning with cycle 6 due to cytopenias. The oxaliplatin was held with cycle 8, cycle 10 and cycle 11. History of neutropenia secondary to chemotherapy. Chronic mild elevation of the CEA Upper endoscopy 01/16/2018-normal esophagus;  mild gastritis; 1 duodenal polyp (tubular adenoma); distal stomach biopsy with antral and corpus mucosa with slight chronic inflammation, negative for H. Pylori,  no intestinal metaplasia dysplasia or malignancy. Right PCA distribution CVA June 2022     Disposition: Greg Robertson remains in clinical remission from colon cancer.  He has chronic mild elevation of the CEA.  The CEA is stable today.  He will return for an office visit and CEA in 1  year.  He has hereditary nonpolyposis colon cancer syndrome.  He is due for surveillance endoscopies.  I will contact Dr. Ardis Hughs.  His sister is with him today.  I recommend his family members undergo screening for  HNPCC.  Betsy Coder, MD  10/12/2020  12:06 PM

## 2020-10-12 NOTE — Progress Notes (Signed)
   Covid-19 Vaccination Clinic  Name:  Greg Robertson    MRN: [id number] DOB: Jun 22, 1950  10/12/2020  Greg Robertson was observed post Covid-19 immunization for 15 minutes without incident. He was provided with Vaccine Information Sheet and instruction to access the V-Safe system.   Greg Robertson was instructed to call 911 with any severe reactions post vaccine: Difficulty breathing  Swelling of face and throat  A fast heartbeat  A bad rash all over body  Dizziness and weakness   Immunizations Administered     Name Date Dose VIS Date Route   Moderna Covid-19 Booster Vaccine 10/12/2020 12:54 PM 0.25 mL 02/05/2020 Intramuscular   Manufacturer: Moderna   Lot: [id number]   [place]: [id number]

## 2020-10-12 NOTE — Telephone Encounter (Signed)
-----   Message from Milus Banister, MD sent at 10/12/2020  4:01 PM EDT ----- Got it, thanks  Jozalyn Baglio, see below.  Let's try contacting him via his sister   ----- Message ----- From: Ladell Pier, MD Sent: 10/12/2020   3:41 PM EDT To: Milus Banister, MD  He is living in a nursing facility, probably best to reach out to his sister, thanks ----- Message ----- From: Milus Banister, MD Sent: 10/12/2020   2:13 PM EDT To: Timothy Lasso, RN, Ladell Pier, MD  Brad, Thanks. He's been not responding to our reminder letters to get back in for these. We'll reach out again.   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

## 2020-10-12 NOTE — Telephone Encounter (Signed)
-----   Message from Milus Banister, MD sent at 10/12/2020  2:11 PM EDT ----- Greg Robertson, Thanks. He's been not responding to our reminder letters to get back in for these. We'll reach out again.   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

## 2020-10-13 ENCOUNTER — Other Ambulatory Visit: Payer: Self-pay | Admitting: Adult Health

## 2020-10-13 DIAGNOSIS — I63431 Cerebral infarction due to embolism of right posterior cerebral artery: Secondary | ICD-10-CM

## 2020-10-13 NOTE — Telephone Encounter (Signed)
Left message on machine to call back  

## 2020-10-14 ENCOUNTER — Non-Acute Institutional Stay (SKILLED_NURSING_FACILITY): Payer: Medicare Other | Admitting: Internal Medicine

## 2020-10-14 ENCOUNTER — Encounter: Payer: Self-pay | Admitting: Internal Medicine

## 2020-10-14 ENCOUNTER — Telehealth: Payer: Self-pay | Admitting: Family Medicine

## 2020-10-14 DIAGNOSIS — H53462 Homonymous bilateral field defects, left side: Secondary | ICD-10-CM

## 2020-10-14 DIAGNOSIS — I639 Cerebral infarction, unspecified: Secondary | ICD-10-CM | POA: Diagnosis not present

## 2020-10-14 DIAGNOSIS — I5033 Acute on chronic diastolic (congestive) heart failure: Secondary | ICD-10-CM

## 2020-10-14 DIAGNOSIS — N179 Acute kidney failure, unspecified: Secondary | ICD-10-CM

## 2020-10-14 DIAGNOSIS — E1059 Type 1 diabetes mellitus with other circulatory complications: Secondary | ICD-10-CM | POA: Diagnosis not present

## 2020-10-14 NOTE — Telephone Encounter (Signed)
Left message to call back  

## 2020-10-14 NOTE — Patient Instructions (Addendum)
Intermittent hypoglycemia here at [facility] will be decreased to 20 units daily.  He will continue daily AC NovoLog coverage as sliding scale.  As a prelude to discharge BMET will be checked 6/30 to assess continuation of the spironolactone as he has CKD stage IIIa. He has GI appointment with Dr. Ardis Hughs on 7/12; post hospital follow-up with Dr. Warrick Parisian his PCP on 7/14 and Neurology follow-up 7/28.

## 2020-10-14 NOTE — Assessment & Plan Note (Addendum)
Continue aspirin until Neurology follow-up with [facility] 11/12/2020.  It is anticipated they will schedule repeat  CT of the head to reassess the volume of the stroke and presence of any residual blood.

## 2020-10-14 NOTE — Assessment & Plan Note (Addendum)
Lasix 20 mg is listed as needed.  He does describe isolated episodes of PND which responded to administration of the Lasix.  In the setting of his CKD and peripheral edema I recommend he continue Lasix as maintenance.  Also sodium restriction was discussed.

## 2020-10-14 NOTE — Progress Notes (Signed)
NURSING HOME LOCATION:  [facility] ROOM NUMBER:  [id number]  CODE STATUS:  Full Code  PCP: Caryl Pina MD  The patient is being discharged from [facility] on 10/20/2020 by Unice Cobble MD. he states that he plans to be in assisted living at [facility] here in [place].  The medical history in this facility was reviewed and summarized and medical problem list was updated. Time spent and note content is documented as follows.  Summary of [facility] [place] medical records: Patient was admitted to this facility 10/01/2020 after being hospitalized 6/12-09/2014 with an acute ischemic stroke.  He presented with 1 day history of sudden onset of left homonymous hemianopsia.  This was in the context of past history of A. fib without anticoagulation, poorly controlled diabetes type 1, dyslipidemia, and history of smoking. CT of the head revealed right occipital stroke with mild mass-effect on the cistern and occipital right lateral horn.  MRI revealed subacute hemorrhagic in  the right PCA distribution infarct with small volume of blood in the occipital horns as well as small remote left thalamic lacunar infarct. Neurology recommended continuing aspirin in lieu of an anticoagulant due to the large size of the stroke.  The CT was to be repeated in 14 days and consideration given to possibly changing to Pradaxa if there was significant improvement in the volume of the stroke and no frank blood. Diabetes was poorly controlled as evidenced by an A1c of 8.7%.  He received sliding scale insulin while hospitalized. Course was complicated by acute on chronic systolic diastolic heart failure.  Echo 6/13 revealed EF of 30% with grade 2 diastolic heart failure.  Blood pressure did stabilize and beta-blocker therapy and Entresto were not initiated for fear of worsening the stroke picture.  Low-dose Lasix was initiated predischarge as well as carvedilol.  Indication of titration of the carvedilol  was to be assessed clinically. Although he has a history of PAF he exhibited sinus tachycardia during hospitalization without other dysrhythmias. AKI was present but creatinine returned to baseline allowing the initiation of a low-dose Lasix.  Review of systems: Pertinent or active symptoms include: He states that he has been having some hypoglycemia @ night on 30 units of Tresiba.  He believes that he has not been able to tolerate higher dose than 14 units in the past.  Although he states that he has had glucoses below 100 associated with some vague head sensation at night recordings reveal values of 105 up to 383.  He did have 1 value of 401 despite the AC insulin and the high-dose [place].  He believes that the low was 60; he took juice and crackers to reverse the hypoglycemia. Despite intermittent significant hyperglycemia; he states that his visual acuity is improving.  He noted he is able to read the data on exercise equipment now while he could not do so when he first entered the facility. The Lasix is listed as needed.  He states that he has had isolated bouts of PND which improved with the Lasix.  Negative ROS: Constitutional: No fever, significant weight change, fatigue  Eyes: No redness, discharge, pain ENT/mouth: No nasal congestion, purulent discharge, earache, change in hearing, sore throat  Cardiovascular: No chest pain, palpitations, claudication Respiratory: No cough, sputum production, hemoptysis, DOE, significant snoring, apnea   Gastrointestinal: No heartburn, dysphagia, abdominal pain, nausea /vomiting, rectal bleeding, melena, change in bowels Genitourinary: No dysuria, hematuria, pyuria, incontinence, nocturia Musculoskeletal: No joint stiffness, joint swelling, weakness, pain Dermatologic: No rash, pruritus,  change in appearance of skin Neurologic: No dizziness, headache, syncope, seizures Psychiatric: No significant anxiety, depression, insomnia, anorexia Endocrine: No  change in hair/skin/nails, excessive thirst, excessive hunger, excessive urination  Hematologic/lymphatic: No significant lymphadenopathy, abnormal bleeding Allergy/immunology: No itchy/watery eyes, significant sneezing, urticaria, angioedema  Physical exam:  Pertinent or positive findings: Pattern alopecia is present.  He has an unkempt beard and mustache.  Ptosis is present greater on the left.  He has trace-1/2+ edema.  He has irregular hyperpigmentation over the shins and isolated bruising over the upper extremity.  General appearance: Adequately nourished; no acute distress, increased work of breathing is present.   Lymphatic: No lymphadenopathy about the head, neck, axilla. Eyes: No conjunctival inflammation or lid edema is present. There is no scleral icterus. Ears:  External ear exam shows no significant lesions or deformities.   Nose:  External nasal examination shows no deformity or inflammation. Nasal mucosa are pink and moist without lesions, exudates Oral exam: Lips and gums are healthy appearing.There is no oropharyngeal erythema or exudate. Neck:  No thyromegaly, masses, tenderness noted.    Heart:  Normal rate and regular rhythm. S1 and S2 normal without gallop, murmur, click, rub.  Lungs: Chest clear to auscultation without wheezes, rhonchi, rales, rubs. Abdomen: Bowel sounds are normal. Abdomen is soft and nontender with no organomegaly, hernias, masses. GU: Deferred as previously addressed. Extremities:  No cyanosis, clubbing, edema  Neurologic exam: Strength equal  in upper & lower extremities Skin: Warm & dry w/o tenting. No significant rash.  See clinical summary of Discharge Diagnoses in the Problem List with associated updated therapeutic plan  Discharge instructions were written and discharge instructions provided. Follow-up will be by the primary care physician in seven - 14 days.

## 2020-10-14 NOTE — Telephone Encounter (Signed)
Appt made for July 14th with dettinger

## 2020-10-14 NOTE — Telephone Encounter (Signed)
Left message on machine to call back   FYI Dr Ardis Hughs I have tried to reach the pt sister and all available numbers in Epic.  Letter was mailed to have the pt or family contact our office.

## 2020-10-14 NOTE — Assessment & Plan Note (Addendum)
Because of reported symptomatic hypoglycemia with glucose as low as 60; Tresiba will be decreased to 20 units.

## 2020-10-14 NOTE — Assessment & Plan Note (Addendum)
BMET will be checked 10/15/2020 to assess risk of continuing the spironolactone.  Blood pressure is well controlled.

## 2020-10-14 NOTE — Assessment & Plan Note (Addendum)
FOV is grossly intact today.  He states his visual acuity is improving but this may reflect improved diabetic control.  His A1c previously was 8.7% indicating poorly controlled diabetes.

## 2020-10-15 ENCOUNTER — Encounter: Payer: Self-pay | Admitting: Internal Medicine

## 2020-10-22 ENCOUNTER — Encounter: Payer: Self-pay | Admitting: Gastroenterology

## 2020-10-26 DIAGNOSIS — J449 Chronic obstructive pulmonary disease, unspecified: Secondary | ICD-10-CM | POA: Diagnosis not present

## 2020-10-26 DIAGNOSIS — I509 Heart failure, unspecified: Secondary | ICD-10-CM | POA: Diagnosis not present

## 2020-10-26 DIAGNOSIS — M6281 Muscle weakness (generalized): Secondary | ICD-10-CM | POA: Diagnosis not present

## 2020-10-26 DIAGNOSIS — I482 Chronic atrial fibrillation, unspecified: Secondary | ICD-10-CM | POA: Diagnosis not present

## 2020-10-26 DIAGNOSIS — J309 Allergic rhinitis, unspecified: Secondary | ICD-10-CM | POA: Diagnosis not present

## 2020-10-26 DIAGNOSIS — I10 Essential (primary) hypertension: Secondary | ICD-10-CM | POA: Diagnosis not present

## 2020-10-26 DIAGNOSIS — I639 Cerebral infarction, unspecified: Secondary | ICD-10-CM | POA: Diagnosis not present

## 2020-10-26 DIAGNOSIS — E782 Mixed hyperlipidemia: Secondary | ICD-10-CM | POA: Diagnosis not present

## 2020-10-26 DIAGNOSIS — E108 Type 1 diabetes mellitus with unspecified complications: Secondary | ICD-10-CM | POA: Diagnosis not present

## 2020-10-26 DIAGNOSIS — D649 Anemia, unspecified: Secondary | ICD-10-CM | POA: Diagnosis not present

## 2020-10-27 ENCOUNTER — Ambulatory Visit
Admission: RE | Admit: 2020-10-27 | Discharge: 2020-10-27 | Disposition: A | Discharge status: Home / Self Care | Payer: Medicare Other | Source: Ambulatory Visit | Attending: Adult Health | Admitting: Adult Health

## 2020-10-27 ENCOUNTER — Other Ambulatory Visit: Payer: Self-pay

## 2020-10-27 DIAGNOSIS — I63431 Cerebral infarction due to embolism of right posterior cerebral artery: Secondary | ICD-10-CM | POA: Diagnosis not present

## 2020-10-29 ENCOUNTER — Other Ambulatory Visit: Payer: Self-pay

## 2020-10-29 ENCOUNTER — Encounter: Payer: Self-pay | Admitting: Family Medicine

## 2020-10-29 ENCOUNTER — Ambulatory Visit (INDEPENDENT_AMBULATORY_CARE_PROVIDER_SITE_OTHER): Payer: Medicare Other | Admitting: Family Medicine

## 2020-10-29 VITALS — BP 120/76 | HR 78 | Ht 75.0 in | Wt 171.0 lb

## 2020-10-29 DIAGNOSIS — E109 Type 1 diabetes mellitus without complications: Secondary | ICD-10-CM | POA: Diagnosis not present

## 2020-10-29 DIAGNOSIS — Z8673 Personal history of transient ischemic attack (TIA), and cerebral infarction without residual deficits: Secondary | ICD-10-CM

## 2020-10-29 DIAGNOSIS — I5041 Acute combined systolic (congestive) and diastolic (congestive) heart failure: Secondary | ICD-10-CM

## 2020-10-29 DIAGNOSIS — I6930 Unspecified sequelae of cerebral infarction: Secondary | ICD-10-CM

## 2020-10-29 LAB — BAYER DCA HB A1C WAIVED: HB A1C (BAYER DCA - WAIVED): 9.6 % — ABNORMAL HIGH (ref <7.0)

## 2020-10-29 NOTE — Progress Notes (Signed)
BP 120/76   Pulse 78   Ht 6' 3"  (1.905 m)   Wt 171 lb (77.6 kg)   SpO2 96%   BMI 21.37 kg/m    Subjective:   Patient ID: Greg Robertson, male    DOB: 10-Aug-1950, 70 y.o.   MRN: [id number]  HPI: Greg Robertson is a 70 y.o. male presenting on 10/29/2020 for Rehab Follow up (Stroke- vision changes in both eyes)   HPI Patient is coming in today for hospital follow-up and rehab follow-up Patient had CVA on 09/27/2020 and was in the hospital until 10/01/2020 and then discharged to rehab facility.  Patient is following up with neurology on the 28th.  Since leaving he feels like his symptoms are mostly back to normal and denies any breathing issues but does want to go to rehab facility because he does feel like there is high fall risk.  Relevant past medical, surgical, family and social history reviewed and updated as indicated. Interim medical history since our last visit reviewed. Allergies and medications reviewed and updated.  Review of Systems  Constitutional:  Negative for chills and fever.  Eyes:  Positive for visual disturbance. Negative for photophobia.  Respiratory:  Negative for shortness of breath and wheezing.   Cardiovascular:  Negative for chest pain and leg swelling.  Musculoskeletal:  Negative for back pain and gait problem.  Skin:  Negative for rash.  All other systems reviewed and are negative.  Per HPI unless specifically indicated above   Allergies as of 10/29/2020       Reactions   Oxycodone Nausea And Vomiting        Medication List        Accurate as of October 29, 2020 12:38 PM. If you have any questions, ask your nurse or doctor.          acetaminophen 500 MG tablet Commonly known as: TYLENOL Take 1,000 mg by mouth every 6 (six) hours as needed for mild pain or headache. Pain   aspirin 81 MG EC tablet Take 1 tablet (81 mg total) by mouth daily. Swallow whole.   atorvastatin 40 MG tablet Commonly known as: LIPITOR Take 1 tablet (40 mg  total) by mouth daily.   carvedilol 3.125 MG tablet Commonly known as: Coreg Take 1 tablet (3.125 mg total) by mouth 2 (two) times daily.   FreeStyle Libre 2 Reader [employer] USE IN THE MORNING, AT NOON, IN THE EVENING, AND AT BEDTIME AS DIRECTED Dx E10.9   FreeStyle Libre 2 Sensor Misc 1 each by Does not apply route every 14 (fourteen) days   furosemide 20 MG tablet Commonly known as: LASIX Take 1 tablet (20 mg total) by mouth daily as needed for fluid or edema.   glucose blood test strip Use as instructed   insulin aspart 100 UNIT/ML injection Commonly known as: NovoLOG INJECT 8-15 UNITS THREE TIMES DAILY BEFORE MEALS What changed: additional instructions   Insulin Pen Needle 32G X 4 MM Misc Use as directed   levocetirizine 5 MG tablet Commonly known as: XYZAL Take 1 tablet (5 mg total) by mouth daily.   Moderna COVID-19 Vaccine 100 MCG/0.5ML injection Generic drug: COVID-19 mRNA vaccine (Moderna) Inject into the muscle.   multivitamin tablet Take 1 tablet by mouth every morning.   spironolactone 25 MG tablet Commonly known as: ALDACTONE Take 0.5 tablets (12.5 mg total) by mouth daily.   Tyler Aas FlexTouch 100 UNIT/ML FlexTouch Pen Generic drug: insulin degludec Inject 30 Units into the skin every  morning.   triamcinolone 55 MCG/ACT Aero nasal inhaler Commonly known as: NASACORT INSTILL 1 SPRAY IN EACH NOSTRIL ONCE OR TWICE DAILY AS NEEDED.   VITAMIN C PO Take 1 tablet by mouth every morning.   Zioptan 0.0015 % Soln Generic drug: Tafluprost (PF) Place 1 drop into both eyes at bedtime.         Objective:   BP 120/76   Pulse 78   Ht 6' 3"  (1.905 m)   Wt 171 lb (77.6 kg)   SpO2 96%   BMI 21.37 kg/m   Wt Readings from Last 3 Encounters:  10/29/20 171 lb (77.6 kg)  10/14/20 179 lb (81.2 kg)  10/12/20 172 lb (78 kg)    Physical Exam Vitals and nursing note reviewed.  Constitutional:      General: He is not in acute distress.    Appearance: He is  well-developed. He is not diaphoretic.  Eyes:     General: No scleral icterus.    Conjunctiva/sclera: Conjunctivae normal.  Neck:     Thyroid: No thyromegaly.  Cardiovascular:     Rate and Rhythm: Normal rate and regular rhythm.     Heart sounds: Normal heart sounds. No murmur heard. Pulmonary:     Effort: Pulmonary effort is normal. No respiratory distress.     Breath sounds: Normal breath sounds. No wheezing.  Musculoskeletal:        General: Normal range of motion.     Cervical back: Neck supple.  Lymphadenopathy:     Cervical: No cervical adenopathy.  Skin:    General: Skin is warm and dry.     Findings: No rash.  Neurological:     Mental Status: He is alert and oriented to person, place, and time.     Coordination: Coordination normal.  Psychiatric:        Behavior: Behavior normal.      Assessment & Plan:   Problem List Items Addressed This Visit   None Visit Diagnoses     History of CVA (cerebrovascular accident)    -  Primary   Relevant Orders   CBC with Differential/Platelet   CMP14+EGFR   Bayer DCA Hb A1c Waived   Acute combined systolic and diastolic congestive heart failure ([place])       Relevant Orders   CBC with Differential/Platelet   CMP14+EGFR   Bayer DCA Hb A1c Waived       Follow-up as needed, mostly the rehab facility is going to cover his care at this point but he will follow-up once he leaves.  He is in assisted living now Follow up plan: Return if symptoms worsen or fail to improve, for In rehab facility will have them start being his doctor, will follow up after he gets out.  Counseling provided for all of the vaccine components Orders Placed This Encounter  Procedures   CBC with Differential/Platelet   CMP14+EGFR   Bayer DCA Hb A1c Waived    Caryl Pina, MD [facility] 10/29/2020, 12:38 PM

## 2020-10-30 LAB — CBC WITH DIFFERENTIAL/PLATELET
Basophils Absolute: 0 10*3/uL (ref 0.0–0.2)
Basos: 1 %
EOS (ABSOLUTE): 0.2 10*3/uL (ref 0.0–0.4)
Eos: 3 %
Hematocrit: 44.7 % (ref 37.5–51.0)
Hemoglobin: 15 g/dL (ref 13.0–17.7)
Immature Grans (Abs): 0 10*3/uL (ref 0.0–0.1)
Immature Granulocytes: 0 %
Lymphocytes Absolute: 2 10*3/uL (ref 0.7–3.1)
Lymphs: 23 %
MCH: 29.8 pg (ref 26.6–33.0)
MCHC: 33.6 g/dL (ref 31.5–35.7)
MCV: 89 fL (ref 79–97)
Monocytes Absolute: 0.9 10*3/uL (ref 0.1–0.9)
Monocytes: 11 %
Neutrophils Absolute: 5.4 10*3/uL (ref 1.4–7.0)
Neutrophils: 62 %
Platelets: 142 10*3/uL — ABNORMAL LOW (ref 150–450)
RBC: 5.03 x10E6/uL (ref 4.14–5.80)
RDW: 12.2 % (ref 11.6–15.4)
WBC: 8.5 10*3/uL (ref 3.4–10.8)

## 2020-10-30 LAB — CMP14+EGFR
ALT: 34 IU/L (ref 0–44)
AST: 28 IU/L (ref 0–40)
Albumin/Globulin Ratio: 2 (ref 1.2–2.2)
Albumin: 4.5 g/dL (ref 3.8–4.8)
Alkaline Phosphatase: 107 IU/L (ref 44–121)
BUN/Creatinine Ratio: 18 (ref 10–24)
BUN: 21 mg/dL (ref 8–27)
Bilirubin Total: 0.4 mg/dL (ref 0.0–1.2)
CO2: 26 mmol/L (ref 20–29)
Calcium: 9.5 mg/dL (ref 8.6–10.2)
Chloride: 99 mmol/L (ref 96–106)
Creatinine, Ser: 1.17 mg/dL (ref 0.76–1.27)
Globulin, Total: 2.2 g/dL (ref 1.5–4.5)
Glucose: 211 mg/dL — ABNORMAL HIGH (ref 65–99)
Potassium: 5.6 mmol/L — ABNORMAL HIGH (ref 3.5–5.2)
Sodium: 138 mmol/L (ref 134–144)
Total Protein: 6.7 g/dL (ref 6.0–8.5)
eGFR: 67 mL/min/{1.73_m2} (ref >59)

## 2020-10-31 DIAGNOSIS — M6281 Muscle weakness (generalized): Secondary | ICD-10-CM | POA: Diagnosis not present

## 2020-10-31 DIAGNOSIS — R2681 Unsteadiness on feet: Secondary | ICD-10-CM | POA: Diagnosis not present

## 2020-11-02 DIAGNOSIS — E108 Type 1 diabetes mellitus with unspecified complications: Secondary | ICD-10-CM | POA: Diagnosis not present

## 2020-11-04 ENCOUNTER — Telehealth: Payer: Self-pay | Admitting: *Deleted

## 2020-11-04 NOTE — Telephone Encounter (Signed)
Called patient's #, reached sister, Mariam Dollar . No DPR on file for [facility], advised her I was calling from [place]. She asked me to call patient on mobile; [phone]; he is at [facility]  Called patient and informed him that recent CT scan showed improvement of prior bleeding - Dr. Leonie Man further reviewed and cleared to start anticoagulation for his history of atrial fibrillation - Dr. Shan Levans was notified and will follow up with him regarding starting anticoagulation. He asked me to call Manuela Schwartz and give her information. Patient verbalized understanding, appreciation. Called Manuela Schwartz and gave her result message. She verbalized understanding, appreciation. Patient has hospital FU on 11/12/20.  Manuela Schwartz will be with him.

## 2020-11-05 DIAGNOSIS — E782 Mixed hyperlipidemia: Secondary | ICD-10-CM | POA: Diagnosis not present

## 2020-11-05 DIAGNOSIS — E119 Type 2 diabetes mellitus without complications: Secondary | ICD-10-CM | POA: Diagnosis not present

## 2020-11-05 DIAGNOSIS — R278 Other lack of coordination: Secondary | ICD-10-CM | POA: Diagnosis not present

## 2020-11-05 DIAGNOSIS — M6281 Muscle weakness (generalized): Secondary | ICD-10-CM | POA: Diagnosis not present

## 2020-11-05 DIAGNOSIS — D519 Vitamin B12 deficiency anemia, unspecified: Secondary | ICD-10-CM | POA: Diagnosis not present

## 2020-11-05 DIAGNOSIS — I69398 Other sequelae of cerebral infarction: Secondary | ICD-10-CM | POA: Diagnosis not present

## 2020-11-05 DIAGNOSIS — E559 Vitamin D deficiency, unspecified: Secondary | ICD-10-CM | POA: Diagnosis not present

## 2020-11-09 DIAGNOSIS — E108 Type 1 diabetes mellitus with unspecified complications: Secondary | ICD-10-CM | POA: Diagnosis not present

## 2020-11-10 ENCOUNTER — Telehealth (HOSPITAL_COMMUNITY): Payer: Self-pay | Admitting: Internal Medicine

## 2020-11-10 NOTE — Telephone Encounter (Signed)
Called patient to discuss head CT results and potentially restarting anticoagulation.  Discussed with Dr. Aundra Dubin, would prefer that he is on eliquis.  Spoke with pharmacy and reviewed potential adverse reactions to eliquis and I can find no mention of nose vesicles as a potential side effect.  Will need to clarify exactly what happened with the eliquis and if he is willing to try re-initation.  Attempted to call him several times today, he is no longer at SNF. No answer on home or cell phone.  No voicemail available on cell phone but left a message on his home phone for him to call us back.

## 2020-11-12 ENCOUNTER — Ambulatory Visit (INDEPENDENT_AMBULATORY_CARE_PROVIDER_SITE_OTHER): Payer: Medicare Other | Admitting: Adult Health

## 2020-11-12 ENCOUNTER — Encounter: Payer: Self-pay | Admitting: Adult Health

## 2020-11-12 ENCOUNTER — Other Ambulatory Visit: Payer: Self-pay

## 2020-11-12 ENCOUNTER — Ambulatory Visit: Payer: Self-pay | Admitting: Nurse Practitioner

## 2020-11-12 ENCOUNTER — Other Ambulatory Visit: Payer: Medicare Other

## 2020-11-12 VITALS — BP 124/72 | HR 79 | Ht 74.0 in | Wt 173.0 lb

## 2020-11-12 DIAGNOSIS — I63431 Cerebral infarction due to embolism of right posterior cerebral artery: Secondary | ICD-10-CM

## 2020-11-12 DIAGNOSIS — I480 Paroxysmal atrial fibrillation: Secondary | ICD-10-CM | POA: Diagnosis not present

## 2020-11-12 DIAGNOSIS — I10 Essential (primary) hypertension: Secondary | ICD-10-CM | POA: Diagnosis not present

## 2020-11-12 DIAGNOSIS — E785 Hyperlipidemia, unspecified: Secondary | ICD-10-CM

## 2020-11-12 DIAGNOSIS — E1165 Type 2 diabetes mellitus with hyperglycemia: Secondary | ICD-10-CM

## 2020-11-12 MED ORDER — RIVAROXABAN 20 MG PO TABS: 20.0000 mg | ORAL_TABLET | Freq: Every day | ORAL | 0 refills | Status: DC

## 2020-11-12 NOTE — Patient Instructions (Addendum)
Recommend starting Xarelto 20 mg in the evening and continue atorvastatin '40mg'$  daily Stop aspirin '81mg'$  daily at this time  Continue to follow up with PCP regarding cholesterol, diabetes and blood pressure management  Maintain strict control of hypertension with blood pressure goal below 130/90, diabetes with hemoglobin A1c goal below 7% and cholesterol with LDL cholesterol (bad cholesterol) goal below 70 mg/dL.      Followup in the future with me in 4 months or call earlier if needed      Thank you for coming to see [place] at [facility]. I hope we have been able to provide you high quality care today.  You may receive a patient satisfaction survey over the next few weeks. We would appreciate your feedback and comments so that we may continue to improve ourselves and the health of our patients.

## 2020-11-12 NOTE — Progress Notes (Signed)
[facility] [address] [place]. [place] [address] [phone]       HOSPITAL FOLLOW UP NOTE  [name] Date of Birth:  [birth year] Medical Record Number:  [id number]   Reason for Referral:  hospital stroke follow up    SUBJECTIVE:   CHIEF COMPLAINT:  Chief Complaint  Patient presents with   Follow-up    Rm 3 with sister- Here for f/u from [place] and f/u from recent CT- Reports he is going ok vision as not returned to baseline yet. Met with eye doctor and sts cataract removal may need to take place in the future. Was completing Rehab through [facility], d/c on 10/20/2020 now resides at [facility] senior living.     HPI:   [name] is a 70 y.o. male w/pmh of atrial fibrillation not on [facility], DM2 poorly controlled, former smoker, prior R CRAO, HTN who presented on 09/27/2020 with left sided vision loss.  Personally reviewed hospitalization pertinent progress notes, lab work and imaging summary.  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 fib not on AC vs reduced EF 30 to 35% in setting of HF.  Recommend initiating AC but deferred on starting due to significant hemorrhagic conversion recommended follow-up imaging 10 to 14 days prior to starting.  He was discharged on aspirin 81 mg daily and atorvastatin 40 mg daily.  LDL 128.  A1c 8.7.  HTN stable during admission.  He was discharged to [facility] living and rehab on 10/01/2020.   Today, 11/12/2020, Mr. Grzelak is being seen for hospital follow-up accompanied by his sister.  He has since been discharged from rehab on 10/20/2020 and currently residing at [facility] living.  Reports vision has been gradually improving -he is unable to notice any residual peripheral vision concerns.  He is currently being followed by ophthalmology for cataracts and has history of R CRAO with residual visual impairment.  Denies new stroke/TIA symptoms.   Repeat CT head showed improvement of prior bleed and was cleared to initiate [facility] -not yet been started as cardiologist Dr. Shan Levans due to difficulty contacting -he requests that this be further discussed at today's visit and potentially starting.  Discussed possibly initiating Eliquis - hx of intolerance reporting "boils in my nose, almost like large pimples. They would pop and then another one would show up. I stopped taking it for a couple of months and they went away. When I tried to restart Eliquis, they came back again".  He has not previously trialed Xarelto or Pradaxa.  Per sister, he plans to establish care with Dr Aundra Dubin with initial visit on 8/16.  He is currently on aspirin 81 mg daily and atorvastatin 40 mg daily without associated side effects.  Blood pressure today 124/72.  No further concerns at this time.     ROS:   14 system review of systems performed and negative with exception of those listed in HPI  PMH:  Past Medical History:  Diagnosis Date   Allergic rhinitis    Anemia    Atrial fibrillation ([place])    Persistent   Cancer of ascending colon, 7cm 05/25/2011   Diabetes mellitus type I ([place])    Glaucoma    Hypertension    Pneumonia 1979 or 1980   Prostate nodule    Substance abuse ([place])     PSH:  Past Surgical History:  Procedure Laterality Date   broken left shoulder blade and collar bone  FINGER SURGERY  2009   right middle   LAPAROSCOPIC ASSISTED 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     NASAL FRACTURE SURGERY  1968   PORTACATH PLACEMENT  07/01/2011   Procedure: INSERTION PORT-A-CATH;  Surgeon: Adin Hector, MD;  Location: [facility];  Service: General;  Laterality: Left;  Insertion of Port-A-Catheter Left Internal Jugular   TONSILLECTOMY  1957 - approximate    Social History:  Social History   Socioeconomic History   Marital status: Single    Spouse name: Not on file   Number of children: 0   Years of education: 16   Highest education level:  Bachelor's degree (e.g., BA, AB, BS)  Occupational History   Occupation: Retired    Comment: Press photographer  Tobacco Use   Smoking status: Former    Packs/day: 1.00    Years: 15.00    Pack years: 15.00    Types: Cigarettes    Quit date: 04/18/1993    Years since quitting: 27.5   Smokeless tobacco: Never   Tobacco comments:    marijuana every night   Vaping Use   Vaping Use: Never used  Substance and Sexual Activity   Alcohol use: Yes    Alcohol/week: 5.0 standard drinks    Types: 3 Cans of beer, 2 Shots of liquor per week    Comment: 1 drink every 2 days   Drug use: Yes    Types: Marijuana    Comment: once a night marijuana   Sexual activity: Not Currently  Other Topics Concern   Not on file  Social History Narrative   Lives alone.  Retired from [employer].    Social Determinants of Health   Financial Resource Strain: Low Risk    Difficulty of Paying Living Expenses: Not hard at all  Food Insecurity: No Food Insecurity   Worried About Charity fundraiser in the Last Year: Never true   [place] in the Last Year: Never true  Transportation Needs: No Transportation Needs   Lack of Transportation (Medical): No   Lack of Transportation (Non-Medical): No  Physical Activity: Sufficiently Active   Days of Exercise per Week: 5 days   Minutes of Exercise per Session: 40 min  Stress: No Stress Concern Present   Feeling of Stress : Only a little  Social Connections: Moderately Isolated   Frequency of Communication with Friends and Family: More than three times a week   Frequency of Social Gatherings with Friends and Family: Three times a week   Attends Religious Services: 1 to 4 times per year   Active Member of Clubs or Organizations: No   Attends Archivist Meetings: Never   Marital Status: Never married  Human resources officer Violence: Not At Risk   Fear of Current or Ex-Partner: No   Emotionally Abused: No   Physically Abused: No   Sexually Abused: No     Family History:  Family History  Problem Relation Age of Onset   Colon polyps Father    Lung cancer Father    Diabetes Father    Prostate cancer Father    Breast cancer Mother    Pancreatitis Mother        intestinal adhesions   Insomnia Mother    Colon cancer Neg Hx    Rectal cancer Neg Hx     Medications:   Current Outpatient Medications on File Prior to Visit  Medication Sig Dispense Refill   acetaminophen (TYLENOL) 500 MG tablet Take 1,000 mg  by mouth every 6 (six) hours as needed for mild pain or headache. Pain     Ascorbic Acid (VITAMIN C PO) Take 1 tablet by mouth every morning.      aspirin 81 MG EC tablet Take 1 tablet (81 mg total) by mouth daily. Swallow whole. 30 tablet 11   atorvastatin (LIPITOR) 40 MG tablet Take 1 tablet (40 mg total) by mouth daily. 30 tablet 3   carvedilol (COREG) 3.125 MG tablet Take 1 tablet (3.125 mg total) by mouth 2 (two) times daily. 60 tablet 11   Continuous Blood Gluc Receiver (FREESTYLE LIBRE 2 READER) DEVI USE IN THE MORNING, AT NOON, IN THE EVENING, AND AT BEDTIME AS DIRECTED Dx E10.9 1 each 1   Continuous Blood Gluc Sensor (FREESTYLE LIBRE 2 SENSOR) MISC 1 each by Does not apply route every 14 (fourteen) days 6 each 3   COVID-19 mRNA vaccine, Moderna, 100 MCG/0.5ML injection Inject into the muscle. 0.25 mL 0   furosemide (LASIX) 20 MG tablet Take 1 tablet (20 mg total) by mouth daily as needed for fluid or edema. 30 tablet 5   glucose blood test strip Use as instructed 100 each 12   Insulin Aspart (NOVOLOG FLEXPEN [place]) Inject into the skin. No insulin if BS < 100 or not eating  9 units if BS is 100 or greater 3 times a day with meals     insulin degludec (TRESIBA FLEXTOUCH) 100 UNIT/ML FlexTouch Pen Inject 30 Units into the skin every morning. (Patient taking differently: Inject 20 Units into the skin every morning.) 15 mL 3   Insulin Pen Needle 32G X 4 MM MISC Use as directed 100 each 0   levocetirizine (XYZAL) 5 MG tablet Take 1  tablet (5 mg total) by mouth daily. 90 tablet 3   Multiple Vitamin (MULTIVITAMIN) tablet Take 1 tablet by mouth every morning.      spironolactone (ALDACTONE) 25 MG tablet Take 0.5 tablets (12.5 mg total) by mouth daily. 45 tablet 3   triamcinolone (NASACORT) 55 MCG/ACT AERO nasal inhaler INSTILL 1 SPRAY IN EACH NOSTRIL ONCE OR TWICE DAILY AS NEEDED. 16.5 Bottle 11   No current facility-administered medications on file prior to visit.    Allergies:   Allergies  Allergen Reactions   Oxycodone Nausea And Vomiting      OBJECTIVE:  Physical Exam  Vitals:   11/12/20 1359  BP: 124/72  Pulse: 79  SpO2: 98%  Weight: 173 lb (78.5 kg)  Height: 6' 2"  (1.88 m)   Body mass index is 22.21 kg/m. No results found.  Post stroke PHQ 2/9 Depression screen PHQ 2/9 10/29/2020  Decreased Interest 0  Down, Depressed, Hopeless 0  PHQ - 2 Score 0  Some recent data might be hidden     General: well developed, well nourished, very pleasant elderly Caucasian male, seated, in no evident distress Head: head normocephalic and atraumatic.   Neck: supple with no carotid or supraclavicular bruits Cardiovascular: regular rate and rhythm, no murmurs Musculoskeletal: no deformity Skin:  no rash/petichiae Vascular:  Normal pulses all extremities   Neurologic Exam Mental Status: Awake and fully alert.  Fluent speech and language.  Oriented to place and time. Recent and remote memory intact. Attention span, concentration and fund of knowledge appropriate. Mood and affect appropriate.  Cranial Nerves: Fundoscopic exam reveals sharp disc margins. Pupils equal, briskly reactive to light. Extraocular movements full without nystagmus. Visual fields full to confrontation although decreased/blurred vision left upper quadrant bilaterally. Hearing intact. Facial  sensation intact. Face, tongue, palate moves normally and symmetrically.  Motor: Normal bulk and tone. Normal strength in all tested extremity  muscles Sensory.: intact to touch , pinprick , position and vibratory sensation.  Coordination: Rapid alternating movements normal in all extremities. Finger-to-nose and heel-to-shin performed accurately bilaterally. Gait and Station: Arises from chair without difficulty. Stance is normal. Gait demonstrates normal stride length and balance without use of assistive device. Tandem walk and heel toe with mild difficulty.  Reflexes: 1+ and symmetric. Toes downgoing.     NIHSS  1 Modified Rankin  2-3      ASSESSMENT: KAYMON DENOMME is a 70 y.o. 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 Vascular risk factors include HTN, HLD, DM, AFib not on AC, history R CRAO and former tobacco use.      PLAN:  Right PCA stroke with hemorrhagic conversion:  Residual deficit: Visual impairment - left upper quad bilaterally.  Plans to schedule follow-up visit with ophthalmology in September/October for re-evaluation Repeat CT head 10/27/2020 with improvement of prior bleeding and clear to initiate AC.  Dr. Shan Levans cardiologist was notified but has had difficulty getting in contact with patient.  Reports history of Eliquis intolerance -further discussed with Dr. Aundra Dubin who recommends initiating Xarelto -prescription provided for sister to bring to [facility] and advised ongoing management and refills will need to be obtained by cardiology.  Long discussion regarding repeat CT head, risk vs benefit of AC and potential side effects -patient and sister wish to proceed  Continue atorvastatin 40 mg for secondary stroke prevention and discontinue aspirin 81 mg daily once Xarelto initiated Discussed secondary stroke prevention measures and importance of close PCP follow up for aggressive stroke risk factor management. I have gone over the pathophysiology of stroke, warning signs and symptoms, risk  factors and their management in some detail with instructions to go to the closest emergency room for symptoms of concern. Atrial fibrillation: Start Xarelto 20 mg with supper for CHA2DS2-VASc score of at least 7.  See above for additional information.  Establish care with Dr. Aundra Dubin on 8/16 ongoing monitoring and management.  History of Eliquis intolerance HTN: BP goal <130/90.  Stable on current regimen per PCP HLD: LDL goal <70. Recent LDL 128 -continue atorvastatin 40 mg daily.  Ensure follow-up with PCP in the next 1 to 2 months for repeat lipid panel avoid prescribing of atorvastatin DMII: A1c goal<7.0. Recent A1c 8.7.  Continue routine monitoring at home and follow-up with PCP    Follow up in 4 months or call earlier if needed   CC:  [place] provider: Dr. Leonie Man PCP: Dettinger, Fransisca Kaufmann, MD   Cardiologist: Dr. Aundra Dubin, Dr. Shan Levans  I spent 56 minutes of face-to-face and non-face-to-face time with patient and sister.  This included previsit chart review including recent hospitalization, lab review, study review, order entry, electronic health record documentation, patient and sister education and discussion regarding above topics including education, interventions and answered all other questions to patient and sisters satisfaction  Frann Rider, [facility]  [facility] [address] [place], [place] [address]  Phone [phone] Fax [phone] Note: This document was prepared with digital dictation and possible smart phrase technology. Any transcriptional errors that result from this process are unintentional.

## 2020-11-12 NOTE — Progress Notes (Signed)
I agree with the above plan 

## 2020-11-16 DIAGNOSIS — M6281 Muscle weakness (generalized): Secondary | ICD-10-CM | POA: Diagnosis not present

## 2020-11-16 DIAGNOSIS — R278 Other lack of coordination: Secondary | ICD-10-CM | POA: Diagnosis not present

## 2020-11-16 DIAGNOSIS — I69398 Other sequelae of cerebral infarction: Secondary | ICD-10-CM | POA: Diagnosis not present

## 2020-11-17 DIAGNOSIS — M6281 Muscle weakness (generalized): Secondary | ICD-10-CM | POA: Diagnosis not present

## 2020-11-17 DIAGNOSIS — R2681 Unsteadiness on feet: Secondary | ICD-10-CM | POA: Diagnosis not present

## 2020-11-18 ENCOUNTER — Other Ambulatory Visit: Payer: Self-pay | Admitting: Internal Medicine

## 2020-11-18 ENCOUNTER — Other Ambulatory Visit: Payer: Self-pay | Admitting: Family Medicine

## 2020-11-18 NOTE — Telephone Encounter (Signed)
PCP is Dr Vonna Kotyk Dettinger

## 2020-11-19 MED ORDER — INSULIN PEN NEEDLE 32G X 4 MM MISC: 0 refills | Status: DC

## 2020-11-23 DIAGNOSIS — I10 Essential (primary) hypertension: Secondary | ICD-10-CM | POA: Diagnosis not present

## 2020-11-23 DIAGNOSIS — I482 Chronic atrial fibrillation, unspecified: Secondary | ICD-10-CM | POA: Diagnosis not present

## 2020-11-23 DIAGNOSIS — E782 Mixed hyperlipidemia: Secondary | ICD-10-CM | POA: Diagnosis not present

## 2020-11-23 DIAGNOSIS — E559 Vitamin D deficiency, unspecified: Secondary | ICD-10-CM | POA: Diagnosis not present

## 2020-11-23 DIAGNOSIS — E108 Type 1 diabetes mellitus with unspecified complications: Secondary | ICD-10-CM | POA: Diagnosis not present

## 2020-11-25 ENCOUNTER — Ambulatory Visit: Payer: Medicare Other | Admitting: Cardiology

## 2020-11-27 ENCOUNTER — Ambulatory Visit: Payer: Medicare Other | Admitting: Family Medicine

## 2020-12-01 ENCOUNTER — Ambulatory Visit (HOSPITAL_COMMUNITY)
Admission: RE | Admit: 2020-12-01 | Discharge: 2020-12-01 | Disposition: A | Discharge status: Home / Self Care | Payer: Medicare Other | Source: Ambulatory Visit | Attending: Cardiology | Admitting: Cardiology

## 2020-12-01 ENCOUNTER — Encounter (HOSPITAL_COMMUNITY): Payer: Self-pay | Admitting: Cardiology

## 2020-12-01 ENCOUNTER — Other Ambulatory Visit: Payer: Self-pay

## 2020-12-01 VITALS — BP 124/76 | HR 85 | Wt 179.4 lb

## 2020-12-01 DIAGNOSIS — Z794 Long term (current) use of insulin: Secondary | ICD-10-CM | POA: Insufficient documentation

## 2020-12-01 DIAGNOSIS — I480 Paroxysmal atrial fibrillation: Secondary | ICD-10-CM

## 2020-12-01 DIAGNOSIS — E109 Type 1 diabetes mellitus without complications: Secondary | ICD-10-CM | POA: Diagnosis not present

## 2020-12-01 DIAGNOSIS — Z8673 Personal history of transient ischemic attack (TIA), and cerebral infarction without residual deficits: Secondary | ICD-10-CM | POA: Insufficient documentation

## 2020-12-01 DIAGNOSIS — I110 Hypertensive heart disease with heart failure: Secondary | ICD-10-CM | POA: Insufficient documentation

## 2020-12-01 DIAGNOSIS — E785 Hyperlipidemia, unspecified: Secondary | ICD-10-CM | POA: Diagnosis not present

## 2020-12-01 DIAGNOSIS — Z79899 Other long term (current) drug therapy: Secondary | ICD-10-CM | POA: Insufficient documentation

## 2020-12-01 DIAGNOSIS — I5042 Chronic combined systolic (congestive) and diastolic (congestive) heart failure: Secondary | ICD-10-CM | POA: Diagnosis not present

## 2020-12-01 DIAGNOSIS — Z87891 Personal history of nicotine dependence: Secondary | ICD-10-CM | POA: Insufficient documentation

## 2020-12-01 DIAGNOSIS — I5032 Chronic diastolic (congestive) heart failure: Secondary | ICD-10-CM

## 2020-12-01 DIAGNOSIS — Z7901 Long term (current) use of anticoagulants: Secondary | ICD-10-CM | POA: Insufficient documentation

## 2020-12-01 DIAGNOSIS — E1069 Type 1 diabetes mellitus with other specified complication: Secondary | ICD-10-CM

## 2020-12-01 LAB — BASIC METABOLIC PANEL
Anion gap: 6 (ref 5–15)
BUN: 21 mg/dL (ref 8–23)
CO2: 26 mmol/L (ref 22–32)
Calcium: 9.2 mg/dL (ref 8.9–10.3)
Chloride: 104 mmol/L (ref 98–111)
Creatinine, Ser: 1.34 mg/dL — ABNORMAL HIGH (ref 0.61–1.24)
GFR, Estimated: 57 mL/min — ABNORMAL LOW (ref >60)
Glucose, Bld: 177 mg/dL — ABNORMAL HIGH (ref 70–99)
Potassium: 5.3 mmol/L — ABNORMAL HIGH (ref 3.5–5.1)
Sodium: 136 mmol/L (ref 135–145)

## 2020-12-01 LAB — CBC
HCT: 40 % (ref 39.0–52.0)
Hemoglobin: 13.3 g/dL (ref 13.0–17.0)
MCH: 30.1 pg (ref 26.0–34.0)
MCHC: 33.3 g/dL (ref 30.0–36.0)
MCV: 90.5 fL (ref 80.0–100.0)
Platelets: 128 10*3/uL — ABNORMAL LOW (ref 150–400)
RBC: 4.42 MIL/uL (ref 4.22–5.81)
RDW: 13.1 % (ref 11.5–15.5)
WBC: 7.3 10*3/uL (ref 4.0–10.5)
nRBC: 0 % (ref 0.0–0.2)

## 2020-12-01 LAB — LIPID PANEL
Cholesterol: 149 mg/dL (ref 0–200)
HDL: 58 mg/dL (ref >40)
LDL Cholesterol: 67 mg/dL (ref 0–99)
Total CHOL/HDL Ratio: 2.6 RATIO
Triglycerides: 119 mg/dL (ref <150)
VLDL: 24 mg/dL (ref 0–40)

## 2020-12-01 LAB — BRAIN NATRIURETIC PEPTIDE: B Natriuretic Peptide: 283 pg/mL — ABNORMAL HIGH (ref 0.0–100.0)

## 2020-12-01 MED ORDER — CARVEDILOL 6.25 MG PO TABS: 6.2500 mg | ORAL_TABLET | Freq: Two times a day (BID) | ORAL | 11 refills | Status: DC

## 2020-12-01 NOTE — H&P (View-Only) (Signed)
Patient ID: Greg Robertson, male   DOB: [birth year], 70 y.o.   MRN: [id number] PCP: Dettinger, Fransisca Kaufmann, MD Cardiology: Dr. Aundra Dubin  70 y.o. with history of HTN, type I diabetes, and paroxysmal atrial fibrillation/CVA was referred by Dr. Shan Levans for evaluation of cardiomyopathy.  Patient also has had colon cancer with ileocolectomy in 2/13.  In 11/13, he fell and broke his clavicle (tripped).  Soon after this, he was admitted with DKA and profound dehydration/metabolic derangement.  He was noted to be in atrial fibrillation with RVR.  His cardiac enzymes were elevated, troponin peaked at 1.57. He initially went back into NSR but then had recurrent episodes of atrial fibrillation.  He was also started on Eliquis due to risk of embolic CVA, however he subsequently stopped Eliquis.   He was admitted in 6/22 with right PCA CVA and hemorrhagic conversion.  Initially just started on ASA, later this was stopped and Xarelto started.  Main residual from CVA is mild left upper visual field impairment.   CTA neck this admission showed < 50% bilateral carotid stenosis.  Echo showed EF 30-35%, diffuse hypokinesis.   Patient is living at [facility].  Blood glucose seems to be under better control.  No palpitations, he is in NSR today. No chest pain.  He denies significant exertional dyspnea though I do not think he is particularly active.  No lightheadedness.  No BRBPR/melena.    ECG: NSR, nonspecific T wave changes (personally reviewed)  Labs (12/13): TSH normal, HCT 39.1, LFTs normal, K 4.2, creatinine 1.1 Labs (7/22): K 5.6, creatinine 1.17  PMH: 1. HTN 2. Type I diabetes 3. Anemia, thrombocytopenia, leukopenia: related to chemotherapy.  4. Clavicular fracture 11/13 due to mechanical fall.  5. 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  FOLFOX chemotherapy 3/13 - 9/13.  7. Allergic rhinitis 8. Paroxysmal atrial fibrillation: First diagnosed in 11/13 during admission for DKA. Echo (11/13): EF 55% with  grade I diastolic dysfunction.  9. Right central retinal artery occlusion 10. 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  - CTA neck with <50% carotid stenosis.  11. Cardiomyopathy: Echo in 6/22 with EF 30-35%, global hypokinesis, normal RV.   SH: Currently living in SNF.  Quit smoking in 1995.  H/o marijuana.  Quit drinking ETOH in 2022.    FH: No atrial fibrillation or CAD.   ROS: All systems reviewed and negative except as per HPI.   Current Outpatient Medications  Medication Sig Dispense Refill   acetaminophen (TYLENOL) 500 MG tablet Take 1,000 mg by mouth every 6 (six) hours as needed for mild pain or headache. Pain     Ascorbic Acid (VITAMIN C PO) Take 1 tablet by mouth every morning.      atorvastatin (LIPITOR) 40 MG tablet Take 1 tablet (40 mg total) by mouth daily. 30 tablet 3   Continuous Blood Gluc Receiver (FREESTYLE LIBRE 2 READER) DEVI USE IN THE MORNING, AT NOON, IN THE EVENING, AND AT BEDTIME AS DIRECTED Dx E10.9 1 each 1   Continuous Blood Gluc Sensor (FREESTYLE LIBRE 2 SENSOR) MISC 1 each by Does not apply route every 14 (fourteen) days 6 each 3   COVID-19 mRNA vaccine, Moderna, 100 MCG/0.5ML injection Inject into the muscle. 0.25 mL 0   furosemide (LASIX) 20 MG tablet Take 1 tablet (20 mg total) by mouth daily as needed for fluid or edema. 30 tablet 5   glucose blood test strip Use as instructed 100 each 12   Insulin Aspart (  NOVOLOG FLEXPEN [place]) Inject into the skin. No insulin if BS < 100 or not eating  9 units if BS is 100 or greater 3 times a day with meals     insulin degludec (TRESIBA FLEXTOUCH) 100 UNIT/ML FlexTouch Pen Inject 30 Units into the skin every morning. (Patient taking differently: Inject 20 Units into the skin every morning.) 15 mL 3   Insulin Pen Needle 32G X 4 MM MISC Use as directed 100 each 0   levocetirizine (XYZAL) 5 MG tablet Take 1 tablet (5 mg total) by mouth daily. 90 tablet 3   Multiple Vitamin (MULTIVITAMIN) tablet Take 1  tablet by mouth every morning.      rivaroxaban (XARELTO) 20 MG TABS tablet Take 1 tablet (20 mg total) by mouth daily with supper. 30 tablet 0   spironolactone (ALDACTONE) 25 MG tablet Take 0.5 tablets (12.5 mg total) by mouth daily. 45 tablet 3   triamcinolone (NASACORT) 55 MCG/ACT AERO nasal inhaler INSTILL 1 SPRAY IN EACH NOSTRIL ONCE OR TWICE DAILY AS NEEDED. 16.5 Bottle 11   carvedilol (COREG) 6.25 MG tablet Take 1 tablet (6.25 mg total) by mouth 2 (two) times daily. 60 tablet 11   No current facility-administered medications for this encounter.    BP 124/76   Pulse 85   Wt 81.4 kg (179 lb 6.4 oz)   SpO2 99%   BMI 23.03 kg/m  General: NAD Neck: No JVD, no thyromegaly or thyroid nodule.  Lungs: Clear to auscultation bilaterally with normal respiratory effort. CV: Nondisplaced PMI.  Heart regular S1/S2, no S3/S4, no murmur.  No peripheral edema.  No carotid bruit.  Normal pedal pulses.  Abdomen: Soft, nontender, no hepatosplenomegaly, no distention.  Skin: Intact without lesions or rashes.  Neurologic: Alert and oriented x 3.  Psych: Normal affect. Extremities: No clubbing or cyanosis.  HEENT: Normal.   Assessment/Plan: 1. Atrial fibrillation:  Paroxysmal. No recent symptoms.  He is in NSR today.  - Continue Xarelto 20 mg daily. CBC today.  - If atrial fibrillation recurs frequently, could use anti-arrhythmic versus ablation.  2. Chronic systolic CHF: Echo in [id number] at time of CVA showed EF 30-35%, diffuse hypokinesis.  Cause is uncertain.  Possible stress cardiomyopathy in the setting of CVA.  Diabetes itself can cause a cardiomyopathy. However, I think that we will need to rule out CAD given multiple RFs, especially long-standing type 1 diabetes. On exam, he is not volume overloaded with REDS clip 17%. NYHA class II symptoms.  - Increase Coreg to 6.25 mg bid.  - Continue spironolactone 12.5 mg daily but will need to get BMET today given recent hyperkalemia. I will not increase  spironolactone or add Entresto until K is rechecked.  - LHC/RHC to assess for coronary disease.  He will hold Xarelto the day before and day of procedure. We discussed risks/benefits and he agrees.  - If cath unremarkable, will need cardiac MRI to rule out infiltrative disease.  - No SGLT2 inhibitor with type 1 diabetes.  3. Hyperlipidemia: Continue atorvastatin, check lipids today.   Followup with me in 2 wks post-cath.   Loralie Champagne 12/01/2020

## 2020-12-01 NOTE — Progress Notes (Signed)
Patient ID: Greg Robertson, male   DOB: Apr 03, 1951, 70 y.o.   MRN: [id number] PCP: Dettinger, Fransisca Kaufmann, MD Cardiology: Dr. Aundra Dubin  70 y.o. with history of HTN, type I diabetes, and paroxysmal atrial fibrillation/CVA was referred by Dr. Shan Levans for evaluation of cardiomyopathy.  Patient also has had colon cancer with ileocolectomy in 2/13.  In 11/13, he fell and broke his clavicle (tripped).  Soon after this, he was admitted with DKA and profound dehydration/metabolic derangement.  He was noted to be in atrial fibrillation with RVR.  His cardiac enzymes were elevated, troponin peaked at 1.57. He initially went back into NSR but then had recurrent episodes of atrial fibrillation.  He was also started on Eliquis due to risk of embolic CVA, however he subsequently stopped Eliquis.   He was admitted in 6/22 with right PCA CVA and hemorrhagic conversion.  Initially just started on ASA, later this was stopped and Xarelto started.  Main residual from CVA is mild left upper visual field impairment.   CTA neck this admission showed < 50% bilateral carotid stenosis.  Echo showed EF 30-35%, diffuse hypokinesis.   Patient is living at [facility].  Blood glucose seems to be under better control.  No palpitations, he is in NSR today. No chest pain.  He denies significant exertional dyspnea though I do not think he is particularly active.  No lightheadedness.  No BRBPR/melena.    ECG: NSR, nonspecific T wave changes (personally reviewed)  Labs (12/13): TSH normal, HCT 39.1, LFTs normal, K 4.2, creatinine 1.1 Labs (7/22): K 5.6, creatinine 1.17  PMH: 1. HTN 2. Type I diabetes 3. Anemia, thrombocytopenia, leukopenia: related to chemotherapy.  4. Clavicular fracture 11/13 due to mechanical fall.  5. 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  FOLFOX chemotherapy 3/13 - 9/13.  7. Allergic rhinitis 8. Paroxysmal atrial fibrillation: First diagnosed in 11/13 during admission for DKA. Echo (11/13): EF 55% with  grade I diastolic dysfunction.  9. Right central retinal artery occlusion 10. 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  - CTA neck with <50% carotid stenosis.  11. Cardiomyopathy: Echo in 6/22 with EF 30-35%, global hypokinesis, normal RV.   SH: Currently living in SNF.  Quit smoking in 1995.  H/o marijuana.  Quit drinking ETOH in 2022.    FH: No atrial fibrillation or CAD.   ROS: All systems reviewed and negative except as per HPI.   Current Outpatient Medications  Medication Sig Dispense Refill   acetaminophen (TYLENOL) 500 MG tablet Take 1,000 mg by mouth every 6 (six) hours as needed for mild pain or headache. Pain     Ascorbic Acid (VITAMIN C PO) Take 1 tablet by mouth every morning.      atorvastatin (LIPITOR) 40 MG tablet Take 1 tablet (40 mg total) by mouth daily. 30 tablet 3   Continuous Blood Gluc Receiver (FREESTYLE LIBRE 2 READER) DEVI USE IN THE MORNING, AT NOON, IN THE EVENING, AND AT BEDTIME AS DIRECTED Dx E10.9 1 each 1   Continuous Blood Gluc Sensor (FREESTYLE LIBRE 2 SENSOR) MISC 1 each by Does not apply route every 14 (fourteen) days 6 each 3   COVID-19 mRNA vaccine, Moderna, 100 MCG/0.5ML injection Inject into the muscle. 0.25 mL 0   furosemide (LASIX) 20 MG tablet Take 1 tablet (20 mg total) by mouth daily as needed for fluid or edema. 30 tablet 5   glucose blood test strip Use as instructed 100 each 12   Insulin Aspart (  NOVOLOG FLEXPEN [place]) Inject into the skin. No insulin if BS < 100 or not eating  9 units if BS is 100 or greater 3 times a day with meals     insulin degludec (TRESIBA FLEXTOUCH) 100 UNIT/ML FlexTouch Pen Inject 30 Units into the skin every morning. (Patient taking differently: Inject 20 Units into the skin every morning.) 15 mL 3   Insulin Pen Needle 32G X 4 MM MISC Use as directed 100 each 0   levocetirizine (XYZAL) 5 MG tablet Take 1 tablet (5 mg total) by mouth daily. 90 tablet 3   Multiple Vitamin (MULTIVITAMIN) tablet Take 1  tablet by mouth every morning.      rivaroxaban (XARELTO) 20 MG TABS tablet Take 1 tablet (20 mg total) by mouth daily with supper. 30 tablet 0   spironolactone (ALDACTONE) 25 MG tablet Take 0.5 tablets (12.5 mg total) by mouth daily. 45 tablet 3   triamcinolone (NASACORT) 55 MCG/ACT AERO nasal inhaler INSTILL 1 SPRAY IN EACH NOSTRIL ONCE OR TWICE DAILY AS NEEDED. 16.5 Bottle 11   carvedilol (COREG) 6.25 MG tablet Take 1 tablet (6.25 mg total) by mouth 2 (two) times daily. 60 tablet 11   No current facility-administered medications for this encounter.    BP 124/76   Pulse 85   Wt 81.4 kg (179 lb 6.4 oz)   SpO2 99%   BMI 23.03 kg/m  General: NAD Neck: No JVD, no thyromegaly or thyroid nodule.  Lungs: Clear to auscultation bilaterally with normal respiratory effort. CV: Nondisplaced PMI.  Heart regular S1/S2, no S3/S4, no murmur.  No peripheral edema.  No carotid bruit.  Normal pedal pulses.  Abdomen: Soft, nontender, no hepatosplenomegaly, no distention.  Skin: Intact without lesions or rashes.  Neurologic: Alert and oriented x 3.  Psych: Normal affect. Extremities: No clubbing or cyanosis.  HEENT: Normal.   Assessment/Plan: 1. Atrial fibrillation:  Paroxysmal. No recent symptoms.  He is in NSR today.  - Continue Xarelto 20 mg daily. CBC today.  - If atrial fibrillation recurs frequently, could use anti-arrhythmic versus ablation.  2. Chronic systolic CHF: Echo in [id number] at time of CVA showed EF 30-35%, diffuse hypokinesis.  Cause is uncertain.  Possible stress cardiomyopathy in the setting of CVA.  Diabetes itself can cause a cardiomyopathy. However, I think that we will need to rule out CAD given multiple RFs, especially long-standing type 1 diabetes. On exam, he is not volume overloaded with REDS clip 17%. NYHA class II symptoms.  - Increase Coreg to 6.25 mg bid.  - Continue spironolactone 12.5 mg daily but will need to get BMET today given recent hyperkalemia. I will not increase  spironolactone or add Entresto until K is rechecked.  - LHC/RHC to assess for coronary disease.  He will hold Xarelto the day before and day of procedure. We discussed risks/benefits and he agrees.  - If cath unremarkable, will need cardiac MRI to rule out infiltrative disease.  - No SGLT2 inhibitor with type 1 diabetes.  3. Hyperlipidemia: Continue atorvastatin, check lipids today.   Followup with me in 2 wks post-cath.   Loralie Champagne 12/01/2020

## 2020-12-01 NOTE — Patient Instructions (Addendum)
RedsClip done today.   EKG done today.  Labs done today. We will contact you only if your labs are abnormal.  INCREASE Carvedilol to 6.'25mg'$  (1 tablet) by mouth 2 times daily.  No other medication changes were made. Please continue all current medications as prescribed.  Your physician recommends that you schedule a follow-up appointment in: 2 weeks after your heart cath.  If you have any questions or concerns before your next appointment please send [place] a message through [place] or call our office at [phone].    TO LEAVE A MESSAGE FOR THE NURSE SELECT OPTION 2, PLEASE LEAVE A MESSAGE INCLUDING: YOUR NAME DATE OF BIRTH CALL BACK NUMBER REASON FOR CALL**this is important as we prioritize the call backs  YOU WILL RECEIVE A CALL BACK THE SAME DAY AS LONG AS YOU CALL BEFORE 4:00 PM   Do the following things EVERYDAY: Weigh yourself in the morning before breakfast. Write it down and keep it in a log. Take your medicines as prescribed Eat low salt foods--Limit salt (sodium) to 2000 mg per day.  Stay as active as you can everyday Limit all fluids for the day to less than 2 liters   At the [facility], you and your health needs are our priority. As part of our continuing mission to provide you with exceptional heart care, we have created designated Provider Care Teams. These Care Teams include your primary Cardiologist (physician) and Advanced Practice Providers (APPs- Physician Assistants and Nurse Practitioners) who all work together to provide you with the care you need, when you need it.   You may see any of the following providers on your designated Care Team at your next follow up: Dr Glori Bickers Dr Haynes Kerns, NP Lyda Jester, [place] Audry Riles, PharmD   Please be sure to bring in all your medications bottles to every appointment.   You are scheduled for a Cardiac Catheterization on Tuesday, August 23 with Dr. Loralie Champagne.  1.  Please arrive at the [facility] (Main Entrance A) at [facility]: [address] [place], [place] [address] at 10:00 AM (This time is two hours before your procedure to ensure your preparation). Free valet parking service is available.   Special note: Every effort is made to have your procedure done on time. Please understand that emergencies sometimes delay scheduled procedures.  2. Diet: Do not eat solid foods after midnight.  The patient may have clear liquids until 5am upon the day of the procedure.   3. Medication instructions in preparation for your procedure:  The day before your procedure and the day after your procedure DO NOT take your Xarelto  Take only 4 units of insulin the night before your procedure. Do not take any insulin on the day of the procedure.  The morning of the procedure DO NOT take your Lasix.  On the morning of your procedure, take your Aspirin and any morning medicines NOT listed above.  You may use sips of water.  5. Plan for one night stay--bring personal belongings. 6. Bring a current list of your medications and current insurance cards. 7. You MUST have a responsible person to drive you home. 8. Someone MUST be with you the first 24 hours after you arrive home or your discharge will be delayed. 9. Please wear clothes that are easy to get on and off and wear slip-on shoes.  Thank you for allowing [place] to care for you!   -- [facility]  Invasive Cardiovascular services

## 2020-12-01 NOTE — Progress Notes (Signed)
ReDS Vest / Clip - 12/01/20 1600       ReDS Vest / Clip   Station Marker C    Ruler Value 30    ReDS Value Range Low volume    ReDS Actual Value 17    Anatomical Comments sitting

## 2020-12-08 ENCOUNTER — Other Ambulatory Visit (HOSPITAL_COMMUNITY): Payer: Self-pay | Admitting: Cardiology

## 2020-12-08 ENCOUNTER — Other Ambulatory Visit: Payer: Self-pay | Admitting: Adult Health

## 2020-12-08 ENCOUNTER — Ambulatory Visit (HOSPITAL_COMMUNITY)
Admission: RE | Disposition: A | Discharge status: Home / Self Care | Payer: Self-pay | Source: Home / Self Care | Attending: Cardiology

## 2020-12-08 ENCOUNTER — Ambulatory Visit (HOSPITAL_COMMUNITY)
Admission: RE | Admit: 2020-12-08 | Discharge: 2020-12-08 | Disposition: A | Discharge status: Home / Self Care | Payer: Medicare Other | Attending: Cardiology | Admitting: Cardiology

## 2020-12-08 ENCOUNTER — Other Ambulatory Visit: Payer: Self-pay

## 2020-12-08 DIAGNOSIS — I429 Cardiomyopathy, unspecified: Secondary | ICD-10-CM | POA: Diagnosis not present

## 2020-12-08 DIAGNOSIS — I110 Hypertensive heart disease with heart failure: Secondary | ICD-10-CM | POA: Diagnosis not present

## 2020-12-08 DIAGNOSIS — Z794 Long term (current) use of insulin: Secondary | ICD-10-CM | POA: Insufficient documentation

## 2020-12-08 DIAGNOSIS — I2510 Atherosclerotic heart disease of native coronary artery without angina pectoris: Secondary | ICD-10-CM | POA: Diagnosis not present

## 2020-12-08 DIAGNOSIS — I480 Paroxysmal atrial fibrillation: Secondary | ICD-10-CM | POA: Insufficient documentation

## 2020-12-08 DIAGNOSIS — I5022 Chronic systolic (congestive) heart failure: Secondary | ICD-10-CM | POA: Diagnosis not present

## 2020-12-08 DIAGNOSIS — E109 Type 1 diabetes mellitus without complications: Secondary | ICD-10-CM | POA: Diagnosis not present

## 2020-12-08 DIAGNOSIS — Z7901 Long term (current) use of anticoagulants: Secondary | ICD-10-CM | POA: Insufficient documentation

## 2020-12-08 DIAGNOSIS — E785 Hyperlipidemia, unspecified: Secondary | ICD-10-CM | POA: Insufficient documentation

## 2020-12-08 DIAGNOSIS — Z87891 Personal history of nicotine dependence: Secondary | ICD-10-CM | POA: Insufficient documentation

## 2020-12-08 DIAGNOSIS — Z79899 Other long term (current) drug therapy: Secondary | ICD-10-CM | POA: Diagnosis not present

## 2020-12-08 HISTORY — PX: RIGHT/LEFT HEART CATH AND CORONARY ANGIOGRAPHY: CATH118266

## 2020-12-08 LAB — GLUCOSE, CAPILLARY
Glucose-Capillary: 95 mg/dL (ref 70–99)
Glucose-Capillary: 110 mg/dL — ABNORMAL HIGH (ref 70–99)

## 2020-12-08 LAB — POCT I-STAT EG7
Acid-Base Excess: 1 mmol/L (ref 0.0–2.0)
Acid-base deficit: 1 mmol/L (ref 0.0–2.0)
Bicarbonate: 25.8 mmol/L (ref 20.0–28.0)
Bicarbonate: 27.6 mmol/L (ref 20.0–28.0)
Calcium, Ion: 1.19 mmol/L (ref 1.15–1.40)
Calcium, Ion: 1.29 mmol/L (ref 1.15–1.40)
HCT: 37 % — ABNORMAL LOW (ref 39.0–52.0)
HCT: 39 % (ref 39.0–52.0)
Hemoglobin: 12.6 g/dL — ABNORMAL LOW (ref 13.0–17.0)
Hemoglobin: 13.3 g/dL (ref 13.0–17.0)
O2 Saturation: 75 %
O2 Saturation: 75 %
Potassium: 4.8 mmol/L (ref 3.5–5.1)
Potassium: 5.1 mmol/L (ref 3.5–5.1)
Sodium: 141 mmol/L (ref 135–145)
Sodium: 142 mmol/L (ref 135–145)
TCO2: 27 mmol/L (ref 22–32)
TCO2: 29 mmol/L (ref 22–32)
pCO2, Ven: 49.5 mmHg (ref 44.0–60.0)
pCO2, Ven: 52.9 mmHg (ref 44.0–60.0)
pH, Ven: 7.325 (ref 7.250–7.430)
pH, Ven: 7.325 (ref 7.250–7.430)
pO2, Ven: 43 mmHg (ref 32.0–45.0)
pO2, Ven: 44 mmHg (ref 32.0–45.0)

## 2020-12-08 SURGERY — RIGHT/LEFT HEART CATH AND CORONARY ANGIOGRAPHY
Anesthesia: LOCAL

## 2020-12-08 MED ORDER — ONDANSETRON HCL 4 MG/2ML IJ SOLN: 4.0000 mg | Freq: Four times a day (QID) | INTRAMUSCULAR | Status: DC | PRN

## 2020-12-08 MED ORDER — SODIUM CHLORIDE 0.9% FLUSH: 3.0000 mL | Freq: Two times a day (BID) | INTRAVENOUS | Status: DC

## 2020-12-08 MED ORDER — HEPARIN (PORCINE) IN NACL 1000-0.9 UT/500ML-% IV SOLN
INTRAVENOUS | Status: DC | PRN
  Administered 2020-12-08: 500 mL

## 2020-12-08 MED ORDER — VERAPAMIL HCL 2.5 MG/ML IV SOLN
INTRAVENOUS | Status: DC | PRN
  Administered 2020-12-08: 10 mL via INTRA_ARTERIAL

## 2020-12-08 MED ORDER — SODIUM CHLORIDE 0.9 % IV SOLN: INTRAVENOUS | Status: DC

## 2020-12-08 MED ORDER — SODIUM CHLORIDE 0.9 % IV SOLN: 250.0000 mL | INTRAVENOUS | Status: DC | PRN

## 2020-12-08 MED ORDER — LIDOCAINE HCL (PF) 1 % IJ SOLN
INTRAMUSCULAR | Status: DC | PRN
  Administered 2020-12-08 (×2): 2 mL

## 2020-12-08 MED ORDER — VERAPAMIL HCL 2.5 MG/ML IV SOLN
INTRAVENOUS | Status: AC
  Filled 2020-12-08: qty 2

## 2020-12-08 MED ORDER — HEPARIN (PORCINE) IN NACL 1000-0.9 UT/500ML-% IV SOLN
INTRAVENOUS | Status: AC
  Filled 2020-12-08: qty 1000

## 2020-12-08 MED ORDER — LIDOCAINE HCL (PF) 1 % IJ SOLN
INTRAMUSCULAR | Status: AC
  Filled 2020-12-08: qty 30

## 2020-12-08 MED ORDER — FENTANYL CITRATE (PF) 100 MCG/2ML IJ SOLN
INTRAMUSCULAR | Status: AC
  Filled 2020-12-08: qty 2

## 2020-12-08 MED ORDER — SODIUM CHLORIDE 0.9% FLUSH: 3.0000 mL | INTRAVENOUS | Status: DC | PRN

## 2020-12-08 MED ORDER — MIDAZOLAM HCL 2 MG/2ML IJ SOLN
INTRAMUSCULAR | Status: DC | PRN
  Administered 2020-12-08: 1 mg via INTRAVENOUS

## 2020-12-08 MED ORDER — HYDRALAZINE HCL 20 MG/ML IJ SOLN: 10.0000 mg | INTRAMUSCULAR | Status: DC | PRN

## 2020-12-08 MED ORDER — ASPIRIN 81 MG PO CHEW
81.0000 mg | CHEWABLE_TABLET | ORAL | Status: AC
  Administered 2020-12-08: 81 mg via ORAL
  Filled 2020-12-08: qty 1

## 2020-12-08 MED ORDER — FENTANYL CITRATE (PF) 100 MCG/2ML IJ SOLN
INTRAMUSCULAR | Status: DC | PRN
  Administered 2020-12-08: 25 ug via INTRAVENOUS

## 2020-12-08 MED ORDER — IOHEXOL 350 MG/ML SOLN
INTRAVENOUS | Status: DC | PRN
  Administered 2020-12-08: 30 mL

## 2020-12-08 MED ORDER — LABETALOL HCL 5 MG/ML IV SOLN: 10.0000 mg | INTRAVENOUS | Status: DC | PRN

## 2020-12-08 MED ORDER — HEPARIN SODIUM (PORCINE) 1000 UNIT/ML IJ SOLN
INTRAMUSCULAR | Status: AC
  Filled 2020-12-08: qty 1

## 2020-12-08 MED ORDER — ACETAMINOPHEN 325 MG PO TABS: 650.0000 mg | ORAL_TABLET | ORAL | Status: DC | PRN

## 2020-12-08 MED ORDER — HEPARIN SODIUM (PORCINE) 1000 UNIT/ML IJ SOLN
INTRAMUSCULAR | Status: DC | PRN
  Administered 2020-12-08: 4500 [IU] via INTRAVENOUS

## 2020-12-08 MED ORDER — MIDAZOLAM HCL 2 MG/2ML IJ SOLN
INTRAMUSCULAR | Status: AC
  Filled 2020-12-08: qty 2

## 2020-12-08 SURGICAL SUPPLY — 10 items
CATH 5FR JL3.5 JR4 ANG PIG MP (CATHETERS) ×2 IMPLANT
CATH BALLN WEDGE 5F 110CM (CATHETERS) ×2 IMPLANT
DEVICE RAD COMP TR BAND LRG (VASCULAR PRODUCTS) ×2 IMPLANT
GLIDESHEATH SLEND SS 6F .021 (SHEATH) ×2 IMPLANT
GUIDEWIRE INQWIRE 1.5J.035X260 (WIRE) ×2 IMPLANT
INQWIRE 1.5J .035X260CM (WIRE) ×4
KIT HEART LEFT (KITS) ×2 IMPLANT
PACK CARDIAC CATHETERIZATION (CUSTOM PROCEDURE TRAY) ×2 IMPLANT
SHEATH GLIDE SLENDER 4/5FR (SHEATH) ×2 IMPLANT
TRANSDUCER W/STOPCOCK (MISCELLANEOUS) ×2 IMPLANT

## 2020-12-08 NOTE — Discharge Instructions (Signed)
Restart Xarelto tomorrow morning (Wednesday 8/24).

## 2020-12-08 NOTE — Interval H&P Note (Signed)
History and Physical Interval Note:  12/08/2020 11:04 AM  Greg Robertson  has presented today for surgery, with the diagnosis of chf.  The various methods of treatment have been discussed with the patient and family. After consideration of risks, benefits and other options for treatment, the patient has consented to  Procedure(s): RIGHT/LEFT HEART CATH AND CORONARY ANGIOGRAPHY (N/A) as a surgical intervention.  The patient's history has been reviewed, patient examined, no change in status, stable for surgery.  I have reviewed the patient's chart and labs.  Questions were answered to the patient's satisfaction.     [name] [employer]

## 2020-12-09 ENCOUNTER — Encounter (HOSPITAL_COMMUNITY): Payer: Self-pay | Admitting: Cardiology

## 2020-12-09 MED FILL — Heparin Sod (Porcine)-NaCl IV Soln 1000 Unit/500ML-0.9%: INTRAVENOUS | Qty: 500 | Status: AC

## 2020-12-09 MED FILL — Verapamil HCl IV Soln 2.5 MG/ML: INTRAVENOUS | Qty: 2 | Status: AC

## 2020-12-11 ENCOUNTER — Other Ambulatory Visit (HOSPITAL_COMMUNITY): Payer: Self-pay

## 2020-12-22 ENCOUNTER — Other Ambulatory Visit: Payer: Self-pay

## 2020-12-22 ENCOUNTER — Encounter (HOSPITAL_COMMUNITY): Payer: Self-pay | Admitting: Cardiology

## 2020-12-22 ENCOUNTER — Other Ambulatory Visit (HOSPITAL_COMMUNITY): Payer: Self-pay

## 2020-12-22 ENCOUNTER — Ambulatory Visit (HOSPITAL_COMMUNITY)
Admission: RE | Admit: 2020-12-22 | Discharge: 2020-12-22 | Disposition: A | Discharge status: Home / Self Care | Payer: Medicare Other | Source: Ambulatory Visit | Attending: Cardiology | Admitting: Cardiology

## 2020-12-22 VITALS — BP 104/60 | HR 82 | Wt 177.4 lb

## 2020-12-22 DIAGNOSIS — Z794 Long term (current) use of insulin: Secondary | ICD-10-CM | POA: Diagnosis not present

## 2020-12-22 DIAGNOSIS — I110 Hypertensive heart disease with heart failure: Secondary | ICD-10-CM | POA: Diagnosis not present

## 2020-12-22 DIAGNOSIS — I428 Other cardiomyopathies: Secondary | ICD-10-CM | POA: Insufficient documentation

## 2020-12-22 DIAGNOSIS — I2510 Atherosclerotic heart disease of native coronary artery without angina pectoris: Secondary | ICD-10-CM | POA: Insufficient documentation

## 2020-12-22 DIAGNOSIS — E109 Type 1 diabetes mellitus without complications: Secondary | ICD-10-CM | POA: Insufficient documentation

## 2020-12-22 DIAGNOSIS — E785 Hyperlipidemia, unspecified: Secondary | ICD-10-CM | POA: Insufficient documentation

## 2020-12-22 DIAGNOSIS — I480 Paroxysmal atrial fibrillation: Secondary | ICD-10-CM | POA: Diagnosis not present

## 2020-12-22 DIAGNOSIS — Z87891 Personal history of nicotine dependence: Secondary | ICD-10-CM | POA: Insufficient documentation

## 2020-12-22 DIAGNOSIS — Z79899 Other long term (current) drug therapy: Secondary | ICD-10-CM | POA: Diagnosis not present

## 2020-12-22 DIAGNOSIS — Z7901 Long term (current) use of anticoagulants: Secondary | ICD-10-CM | POA: Insufficient documentation

## 2020-12-22 DIAGNOSIS — Z09 Encounter for follow-up examination after completed treatment for conditions other than malignant neoplasm: Secondary | ICD-10-CM | POA: Insufficient documentation

## 2020-12-22 DIAGNOSIS — I5042 Chronic combined systolic (congestive) and diastolic (congestive) heart failure: Secondary | ICD-10-CM | POA: Diagnosis not present

## 2020-12-22 DIAGNOSIS — Z8673 Personal history of transient ischemic attack (TIA), and cerebral infarction without residual deficits: Secondary | ICD-10-CM | POA: Diagnosis not present

## 2020-12-22 DIAGNOSIS — I5032 Chronic diastolic (congestive) heart failure: Secondary | ICD-10-CM | POA: Insufficient documentation

## 2020-12-22 LAB — BASIC METABOLIC PANEL
Anion gap: 5 (ref 5–15)
BUN: 26 mg/dL — ABNORMAL HIGH (ref 8–23)
CO2: 25 mmol/L (ref 22–32)
Calcium: 9 mg/dL (ref 8.9–10.3)
Chloride: 105 mmol/L (ref 98–111)
Creatinine, Ser: 1.28 mg/dL — ABNORMAL HIGH (ref 0.61–1.24)
GFR, Estimated: >60 mL/min (ref >60)
Glucose, Bld: 176 mg/dL — ABNORMAL HIGH (ref 70–99)
Potassium: 4.9 mmol/L (ref 3.5–5.1)
Sodium: 135 mmol/L (ref 135–145)

## 2020-12-22 MED ORDER — VALSARTAN 40 MG PO TABS: 20.0000 mg | ORAL_TABLET | Freq: Two times a day (BID) | ORAL | 11 refills | Status: DC

## 2020-12-22 MED ORDER — LOKELMA 5 G PO PACK: 5.0000 g | PACK | Freq: Every day | ORAL | 11 refills | Status: DC

## 2020-12-22 NOTE — Patient Instructions (Addendum)
A copy of the instructions below were given to patient via [facility] orders.   START Valsartan 20 mg twice a day  START Lokelma 5 mg daily  Labs today We will only contact you if something comes back abnormal or we need to make some changes. Otherwise no news is good news!  Labs needed in 10-14 days (ok to have done at facility)  You have been referred to [facility]- Dr Renne Crigler -they will be in contact with an appointment  Your physician has requested that you have a cardiac MRI. Cardiac MRI uses a computer to create images of your heart as its beating, producing both still and moving pictures of your heart and major blood vessels. For further information please visit [email or web address] Please follow the instruction sheet given to you today for more information.  Your physician recommends that you schedule a follow-up appointment in: 3 weeks with the HF Pharmacy team and in 6 weeks with Dr Aundra Dubin

## 2020-12-23 ENCOUNTER — Telehealth (HOSPITAL_COMMUNITY): Payer: Self-pay | Admitting: Pharmacy Technician

## 2020-12-23 NOTE — Telephone Encounter (Signed)
Advanced Heart Failure Patient Advocate Encounter  Patient was seen in clinic yesterday and started on [place].    The current 30 day co-pay is $197.12, which is a barrier. Started and faxed an application for [employer].   Will follow up.

## 2020-12-23 NOTE — Progress Notes (Signed)
Patient ID: Greg Robertson, male   DOB: July 25, 1950, 70 y.o.   MRN: [id number] PCP: Dettinger, Fransisca Kaufmann, MD Cardiology: Dr. Aundra Dubin  70 y.o. with history of HTN, type I diabetes, and paroxysmal atrial fibrillation/CVA was referred by Dr. Shan Levans for evaluation of cardiomyopathy.  Patient also has had colon cancer with ileocolectomy in 2/13.  In 11/13, he fell and broke his clavicle (tripped).  Soon after this, he was admitted with DKA and profound dehydration/metabolic derangement.  He was noted to be in atrial fibrillation with RVR.  His cardiac enzymes were elevated, troponin peaked at 1.57. He initially went back into NSR but then had recurrent episodes of atrial fibrillation.  He was also started on Eliquis due to risk of embolic CVA, however he subsequently stopped Eliquis.   He was admitted in 6/22 with right PCA CVA and hemorrhagic conversion.  Initially just started on ASA, later this was stopped and Xarelto started.  Main residual from CVA is mild left upper visual field impairment.   CTA neck this admission showed < 50% bilateral carotid stenosis.  Echo showed EF 30-35%, diffuse hypokinesis.  RHC/LHC in 8/22 showed nonobstructive CAD and normal filling pressures.   Patient returns for followup of CHF.  Weight down 2 lbs.  No palpitations, he is in NSR today.  He denies exertional dyspnea, no problems walking the halls at [facility].  No orthopnea/PND.  No lightheadedness.    ECG: NSR, lateral TWIs (personally reviewed)  Labs (12/13): TSH normal, HCT 39.1, LFTs normal, K 4.2, creatinine 1.1 Labs (7/22): K 5.6, creatinine 1.17 Labs (8/22): BNP 283, LDL 67, HDL 58, K 5.3, creatinine 1.34  PMH: 1. HTN 2. Type I diabetes 3. Anemia, thrombocytopenia, leukopenia: related to chemotherapy.  4. Clavicular fracture 11/13 due to mechanical fall.  5. 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  FOLFOX chemotherapy 3/13 - 9/13.  7. Allergic rhinitis 8. Paroxysmal atrial fibrillation: First  diagnosed in 11/13 during admission for DKA. Echo (11/13): EF 55% with grade I diastolic dysfunction.  9. Right central retinal artery occlusion 10. 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  - CTA neck with <50% carotid stenosis.  11. Cardiomyopathy: Nonischemic cardiomyopathy.  Echo in 6/22 with EF 30-35%, global hypokinesis, normal RV.   - RHC/LHC (8/22): 40% ostial/proximal LAD, D1 50%.  Mean RA 2, PA 29/9, mean PCWP 8, CI 2.94.   SH: Currently living in SNF.  Quit smoking in 1995.  H/o marijuana.  Quit drinking ETOH in 2022.    FH: No atrial fibrillation or CAD.   ROS: All systems reviewed and negative except as per HPI.   Current Outpatient Medications  Medication Sig Dispense Refill   acetaminophen (TYLENOL) 500 MG tablet Take 1,000 mg by mouth every 6 (six) hours as needed for mild pain or headache. Pain     atorvastatin (LIPITOR) 40 MG tablet Take 1 tablet (40 mg total) by mouth daily. 30 tablet 3   carvedilol (COREG) 6.25 MG tablet Take 1 tablet (6.25 mg total) by mouth 2 (two) times daily. 60 tablet 11   Cholecalciferol (VITAMIN D3) 50 MCG (2000 UT) TABS Take 2,000 Units by mouth in the morning. (0800)     Continuous Blood Gluc Receiver (FREESTYLE LIBRE 2 READER) DEVI USE IN THE MORNING, AT NOON, IN THE EVENING, AND AT BEDTIME AS DIRECTED Dx E10.9 1 each 1   Continuous Blood Gluc Sensor (FREESTYLE LIBRE 2 SENSOR) MISC 1 each by Does not apply route every 14 (  fourteen) days 6 each 3   furosemide (LASIX) 20 MG tablet Take 1 tablet (20 mg total) by mouth daily as needed for fluid or edema. 30 tablet 5   glucose blood test strip Use as instructed 100 each 12   Insulin Aspart (NOVOLOG FLEXPEN [place]) Inject 9-11 Units into the skin daily with supper. No insulin if BS < 100 or not eating  Inject 11 units subcutaneously with breakfast, Inject 9 units subcutaneously with supper & inject 11 units subcutaneously with supper;     Insulin Degludec (TRESIBA [place]) Inject 20 Units  into the skin in the morning.     Insulin Pen Needle 32G X 4 MM MISC Use as directed 100 each 0   levocetirizine (XYZAL) 5 MG tablet Take 1 tablet (5 mg total) by mouth daily. 90 tablet 3   Multiple Vitamin (MULTIVITAMIN WITH MINERALS) TABS tablet Take 1 tablet by mouth daily. (0800)     sodium zirconium cyclosilicate (LOKELMA) 5 g packet Take 5 g by mouth daily. Must be taken with Valsartan 30 packet 11   spironolactone (ALDACTONE) 25 MG tablet Take 0.5 tablets (12.5 mg total) by mouth daily. 45 tablet 3   triamcinolone (NASACORT) 55 MCG/ACT AERO nasal inhaler INSTILL 1 SPRAY IN EACH NOSTRIL ONCE OR TWICE DAILY AS NEEDED. 16.5 Bottle 11   valsartan (DIOVAN) 40 MG tablet Take 0.5 tablets (20 mg total) by mouth 2 (two) times daily. Must be taken with Lokelma 5 mg daily 30 tablet 11   vitamin C (ASCORBIC ACID) 500 MG tablet Take 500 mg by mouth in the morning. (0800)     XARELTO 20 MG TABS tablet TAKE ONE TABLET BY MOUTH EVERY EVENING WITH SUPPER 30 tablet 11   No current facility-administered medications for this encounter.    BP 104/60   Pulse 82   Wt 80.5 kg (177 lb 6.4 oz)   SpO2 98%   BMI 22.78 kg/m  General: NAD Neck: No JVD, no thyromegaly or thyroid nodule.  Lungs: Clear to auscultation bilaterally with normal respiratory effort. CV: Nondisplaced PMI.  Heart regular S1/S2, no S3/S4, no murmur.  Trace ankle edema.  No carotid bruit.  Normal pedal pulses.  Abdomen: Soft, nontender, no hepatosplenomegaly, no distention.  Skin: Intact without lesions or rashes.  Neurologic: Alert and oriented x 3.  Psych: Normal affect. Extremities: No clubbing or cyanosis.  HEENT: Normal.   Assessment/Plan: 1. Atrial fibrillation:  Paroxysmal. No recent symptoms.  He is in NSR today.  - Continue Xarelto 20 mg daily.  - If atrial fibrillation recurs frequently, could use anti-arrhythmic versus ablation.  2. Chronic systolic CHF: Nonischemic cardiomyopathy.  Echo in 6/22 at time of CVA showed EF  30-35%, diffuse hypokinesis.  LHC/RHC in 8/22 showed nonobstructive CAD and normal filling pressures.  Cause is uncertain.  Possible stress cardiomyopathy in the setting of CVA.  Diabetes itself can cause a cardiomyopathy. On exam, he is not volume overloaded with NYHA class II symptoms.  - Continue Coreg 6.25 mg bid.  - Continue spironolactone 12.5 mg daily - I think BP is likely too low for Entresto, will start valsartan 20 mg bid + Lokelma 5 g daily given chronically elevated/upper normal K.  BMET today and in 10 days.  - I will arrange for cardiac MRI to rule out infiltrative disease.  - No SGLT2 inhibitor with type 1 diabetes.  3. Hyperlipidemia: Continue atorvastatin, good lipids 8/22.  4. CAD: Nonobstructive on cath 8/22.  - Continue statin.  - No  ASA given Xarelto use.   Followup 3 wks with HF pharmacist and 6 wks with me.   Loralie Champagne 12/23/2020

## 2020-12-25 NOTE — Telephone Encounter (Signed)
Dr Ardis Hughs,  This pt has been scheduled for [facility] colon 10-5- per hx lynch syndrome, hx colon cancer   We scheduled per request Dr Benay Spice   [phone] pt had a CVA- see hospital note 12-08-2020., pt had a cardiac cath  with blockages and stents, started on Xarelto, and his EF is 30-35 % per his Echo 09-30-2020  so per protocol he does not qualify for an [place] colon.   With all of his new history, do you want him to have an OV?  Please advise, Thanks, Marijean Niemann

## 2020-12-28 DIAGNOSIS — J449 Chronic obstructive pulmonary disease, unspecified: Secondary | ICD-10-CM | POA: Diagnosis not present

## 2020-12-28 NOTE — Telephone Encounter (Signed)
[place]  OV WITH SISTER 11-7 AT 910 AM WITH DR JACOBS DUE TO MEDICAL HX-  CANCELED PV 9-21 AND COLON/EGD 10-5

## 2020-12-31 NOTE — Telephone Encounter (Signed)
Advanced Heart Failure Patient Advocate Encounter   Patient was temporarily approved to receive Lokelma from [employer]. Patient would need to apply for and be denied Medicaid in order to get a full approval. The patient will receive three 30 day fills during the temporary approval timeframe. The first shipment should be delivered in the next 7-10 days. Will mail the patient medicaid application to fill out.

## 2021-01-01 ENCOUNTER — Other Ambulatory Visit (HOSPITAL_COMMUNITY): Payer: Self-pay

## 2021-01-04 DIAGNOSIS — D649 Anemia, unspecified: Secondary | ICD-10-CM | POA: Diagnosis not present

## 2021-01-04 DIAGNOSIS — J449 Chronic obstructive pulmonary disease, unspecified: Secondary | ICD-10-CM | POA: Diagnosis not present

## 2021-01-04 DIAGNOSIS — J309 Allergic rhinitis, unspecified: Secondary | ICD-10-CM | POA: Diagnosis not present

## 2021-01-13 ENCOUNTER — Inpatient Hospital Stay (HOSPITAL_COMMUNITY): Admission: RE | Admit: 2021-01-13 | Payer: Medicare Other | Source: Ambulatory Visit

## 2021-01-13 DIAGNOSIS — J309 Allergic rhinitis, unspecified: Secondary | ICD-10-CM | POA: Diagnosis not present

## 2021-01-13 DIAGNOSIS — J449 Chronic obstructive pulmonary disease, unspecified: Secondary | ICD-10-CM | POA: Diagnosis not present

## 2021-01-13 DIAGNOSIS — D649 Anemia, unspecified: Secondary | ICD-10-CM | POA: Diagnosis not present

## 2021-01-18 DIAGNOSIS — E108 Type 1 diabetes mellitus with unspecified complications: Secondary | ICD-10-CM | POA: Diagnosis not present

## 2021-01-18 NOTE — Progress Notes (Signed)
PCP: Dettinger, Fransisca Kaufmann, MD Cardiology: Dr. Aundra Dubin    HPI:  70 y.o. with history of HTN, type I diabetes, and paroxysmal atrial fibrillation/CVA was referred by Dr. Shan Levans for evaluation of cardiomyopathy.  Patient also has had colon cancer with ileocolectomy in 05/2011.  In 02/2012, he fell and broke his clavicle (tripped).  Soon after this, he was admitted with DKA and profound dehydration/metabolic derangement.  He was noted to be in atrial fibrillation with RVR.  His cardiac enzymes were elevated, troponin peaked at 1.57. He initially went back into NSR but then had recurrent episodes of atrial fibrillation.  He was also started on Eliquis due to risk of embolic CVA, however he subsequently stopped Eliquis.    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  Initially just started on ASA, later this was stopped and Xarelto started.  Main residual from CVA is mild left upper visual field impairment.   CTA neck this admission showed < 50% bilateral carotid stenosis.  Echo showed EF 30-35%, diffuse hypokinesis.  RHC/LHC in 11/2020 showed nonobstructive CAD and normal filling pressures.    Patient recently presented to [facility] for followup of CHF.  Weight was down 2 lbs.  No palpitations, he was in NSR.  He denied exertional dyspnea, no problems walking the halls at [facility].  No orthopnea/PND.  No lightheadedness.    Today he returns to HF clinic for pharmacist medication titration. At last visit with MD valsartan 20 mg BID + Lokelma 5 g daily were initiated. Overall he is feeling well today. He had COVID-19 booster last Thursday. Had "really bad chills for 3 hours then felt fine". No dizziness or lightheadedness. No CP or palpitations. No SOB/DOE. Walks 45 minutes twice daily at facility with no problems. Weight has been increasing as he has been back in the gym working out. No LEE, PND or orthopnea. BP 142/82 in clinic. Notes SBP at facility [facility]) is usually 130-160s.     HF Medications: Carvedilol 6.25 mg BID Spironolactone 12.5 mg daily Valsartan 20 mg BID Furosemide 20 mg PRN Lokelma 5 g daily   Has the patient been experiencing any side effects to the medications prescribed?  no  Does the patient have any problems obtaining medications due to transportation or finances?   Has Medicare Part D. Lokelma is ~$200/month. He was temporarily approved to receive Lokelma from [employer] but needs to apply and be denied Medicaid for full approval.   Understanding of regimen: fair Understanding of indications: fair Potential of compliance: good Patient understands to avoid NSAIDs. Patient understands to avoid decongestants.    Pertinent Lab Values: 12/22/20: Serum creatinine 1.28, BUN 26, Potassium 4.9, Sodium 135 02/01/21: BMET pending  Vital Signs: Weight: 184.2 lbs (last clinic weight: 177.4 lbs) Blood pressure: 142/82  Heart rate: 70   Assessment/Plan: 1. Atrial fibrillation:  Paroxysmal. No recent symptoms.   - Continue Xarelto 20 mg daily.  - If atrial fibrillation recurs frequently, could use anti-arrhythmic versus ablation.  2. Chronic systolic CHF: Nonischemic cardiomyopathy.  Echo in 09/2020 at time of CVA showed EF 30-35%, diffuse hypokinesis.  LHC/RHC in 11/2020 showed nonobstructive CAD and normal filling pressures.  Cause is uncertain.  Possible stress cardiomyopathy in the setting of CVA.  Diabetes itself can cause a cardiomyopathy. - On exam, he is not volume overloaded with NYHA class II symptoms. - BMET today pending  - Increase carvedilol to 12.5 mg BID.  - Continue valsartan 20 mg BID +  Lokelma 5 g daily given chronically elevated/upper normal K.   - Continue spironolactone 12.5 mg daily - No SGLT2 inhibitor with type 1 diabetes.  - Needs cardiac MRI to rule out infiltrative disease.  3. Hyperlipidemia: Continue atorvastatin, good lipids 11/2020.  4. CAD: Nonobstructive on cath 11/2020.  - Continue statin.  - No ASA given Xarelto  use.  Follow up 1 week with Dr. Rush Farmer, PharmD, BCPS, [facility], CPP Heart Failure Clinic Pharmacist [phone]

## 2021-01-20 ENCOUNTER — Encounter: Payer: Medicare Other | Admitting: Gastroenterology

## 2021-01-22 ENCOUNTER — Other Ambulatory Visit (HOSPITAL_COMMUNITY): Payer: Self-pay | Admitting: Cardiology

## 2021-01-27 DIAGNOSIS — Z23 Encounter for immunization: Secondary | ICD-10-CM | POA: Diagnosis not present

## 2021-01-29 ENCOUNTER — Other Ambulatory Visit (HOSPITAL_COMMUNITY): Payer: Self-pay | Admitting: Cardiology

## 2021-02-01 ENCOUNTER — Ambulatory Visit (HOSPITAL_COMMUNITY)
Admission: RE | Admit: 2021-02-01 | Discharge: 2021-02-01 | Disposition: A | Discharge status: Home / Self Care | Payer: Medicare Other | Source: Ambulatory Visit | Attending: Cardiology | Admitting: Cardiology

## 2021-02-01 ENCOUNTER — Other Ambulatory Visit: Payer: Self-pay

## 2021-02-01 VITALS — BP 142/82 | HR 70 | Wt 184.2 lb

## 2021-02-01 DIAGNOSIS — I2510 Atherosclerotic heart disease of native coronary artery without angina pectoris: Secondary | ICD-10-CM | POA: Diagnosis not present

## 2021-02-01 DIAGNOSIS — I428 Other cardiomyopathies: Secondary | ICD-10-CM | POA: Diagnosis not present

## 2021-02-01 DIAGNOSIS — I480 Paroxysmal atrial fibrillation: Secondary | ICD-10-CM | POA: Insufficient documentation

## 2021-02-01 DIAGNOSIS — I5022 Chronic systolic (congestive) heart failure: Secondary | ICD-10-CM | POA: Insufficient documentation

## 2021-02-01 DIAGNOSIS — Z7901 Long term (current) use of anticoagulants: Secondary | ICD-10-CM | POA: Diagnosis not present

## 2021-02-01 DIAGNOSIS — Z79899 Other long term (current) drug therapy: Secondary | ICD-10-CM | POA: Diagnosis not present

## 2021-02-01 DIAGNOSIS — I10 Essential (primary) hypertension: Secondary | ICD-10-CM | POA: Diagnosis not present

## 2021-02-01 DIAGNOSIS — E785 Hyperlipidemia, unspecified: Secondary | ICD-10-CM | POA: Diagnosis not present

## 2021-02-01 DIAGNOSIS — E782 Mixed hyperlipidemia: Secondary | ICD-10-CM | POA: Diagnosis not present

## 2021-02-01 DIAGNOSIS — E559 Vitamin D deficiency, unspecified: Secondary | ICD-10-CM | POA: Diagnosis not present

## 2021-02-01 LAB — BASIC METABOLIC PANEL
Anion gap: 5 (ref 5–15)
BUN: 22 mg/dL (ref 8–23)
CO2: 28 mmol/L (ref 22–32)
Calcium: 8.7 mg/dL — ABNORMAL LOW (ref 8.9–10.3)
Chloride: 105 mmol/L (ref 98–111)
Creatinine, Ser: 1.24 mg/dL (ref 0.61–1.24)
GFR, Estimated: >60 mL/min (ref >60)
Glucose, Bld: 197 mg/dL — ABNORMAL HIGH (ref 70–99)
Potassium: 5.1 mmol/L (ref 3.5–5.1)
Sodium: 138 mmol/L (ref 135–145)

## 2021-02-01 MED ORDER — CARVEDILOL 12.5 MG PO TABS: 12.5000 mg | ORAL_TABLET | Freq: Two times a day (BID) | ORAL | 11 refills | Status: DC

## 2021-02-01 NOTE — Patient Instructions (Addendum)
It was a pleasure seeing you today!  MEDICATIONS: -We are changing your medications today -Increase carvedilol to 12.5 mg (1 tablet) twice daily -Call if you have questions about your medications.  LABS: -We will call you if your labs need attention.  NEXT APPOINTMENT: Return to clinic in 1 week with Dr. Aundra Dubin.  In general, to take care of your heart failure: -Limit your fluid intake to 2 Liters (half-gallon) per day.   -Limit your salt intake to ideally 2-3 grams (2000-3000 mg) per day. -Weigh yourself daily and record, and bring that "weight diary" to your next appointment.  (Weight gain of 2-3 pounds in 1 day typically means fluid weight.) -The medications for your heart are to help your heart and help you live longer.   -Please contact us before stopping any of your heart medications.  Call the clinic at [phone] with questions or to reschedule future appointments.

## 2021-02-08 DIAGNOSIS — I10 Essential (primary) hypertension: Secondary | ICD-10-CM | POA: Diagnosis not present

## 2021-02-08 DIAGNOSIS — E109 Type 1 diabetes mellitus without complications: Secondary | ICD-10-CM | POA: Diagnosis not present

## 2021-02-09 ENCOUNTER — Other Ambulatory Visit (HOSPITAL_COMMUNITY): Payer: Self-pay

## 2021-02-09 ENCOUNTER — Telehealth (HOSPITAL_COMMUNITY): Payer: Self-pay | Admitting: Pharmacy Technician

## 2021-02-09 ENCOUNTER — Encounter (HOSPITAL_COMMUNITY): Payer: Self-pay | Admitting: Cardiology

## 2021-02-09 ENCOUNTER — Ambulatory Visit (HOSPITAL_COMMUNITY)
Admission: RE | Admit: 2021-02-09 | Discharge: 2021-02-09 | Disposition: A | Discharge status: Home / Self Care | Payer: Medicare Other | Source: Ambulatory Visit | Attending: Cardiology | Admitting: Cardiology

## 2021-02-09 ENCOUNTER — Other Ambulatory Visit: Payer: Self-pay

## 2021-02-09 VITALS — BP 128/60 | HR 72 | Wt 186.6 lb

## 2021-02-09 DIAGNOSIS — I5022 Chronic systolic (congestive) heart failure: Secondary | ICD-10-CM | POA: Insufficient documentation

## 2021-02-09 DIAGNOSIS — E785 Hyperlipidemia, unspecified: Secondary | ICD-10-CM | POA: Diagnosis not present

## 2021-02-09 DIAGNOSIS — Z79899 Other long term (current) drug therapy: Secondary | ICD-10-CM | POA: Diagnosis not present

## 2021-02-09 DIAGNOSIS — Z794 Long term (current) use of insulin: Secondary | ICD-10-CM | POA: Diagnosis not present

## 2021-02-09 DIAGNOSIS — I428 Other cardiomyopathies: Secondary | ICD-10-CM | POA: Diagnosis not present

## 2021-02-09 DIAGNOSIS — I2510 Atherosclerotic heart disease of native coronary artery without angina pectoris: Secondary | ICD-10-CM | POA: Insufficient documentation

## 2021-02-09 DIAGNOSIS — Z7901 Long term (current) use of anticoagulants: Secondary | ICD-10-CM | POA: Diagnosis not present

## 2021-02-09 DIAGNOSIS — I110 Hypertensive heart disease with heart failure: Secondary | ICD-10-CM | POA: Diagnosis not present

## 2021-02-09 DIAGNOSIS — I480 Paroxysmal atrial fibrillation: Secondary | ICD-10-CM | POA: Insufficient documentation

## 2021-02-09 DIAGNOSIS — Z8673 Personal history of transient ischemic attack (TIA), and cerebral infarction without residual deficits: Secondary | ICD-10-CM | POA: Insufficient documentation

## 2021-02-09 DIAGNOSIS — Z87891 Personal history of nicotine dependence: Secondary | ICD-10-CM | POA: Diagnosis not present

## 2021-02-09 DIAGNOSIS — E109 Type 1 diabetes mellitus without complications: Secondary | ICD-10-CM | POA: Insufficient documentation

## 2021-02-09 LAB — BASIC METABOLIC PANEL
Anion gap: 8 (ref 5–15)
BUN: 29 mg/dL — ABNORMAL HIGH (ref 8–23)
CO2: 26 mmol/L (ref 22–32)
Calcium: 9.3 mg/dL (ref 8.9–10.3)
Chloride: 104 mmol/L (ref 98–111)
Creatinine, Ser: 1.37 mg/dL — ABNORMAL HIGH (ref 0.61–1.24)
GFR, Estimated: 56 mL/min — ABNORMAL LOW (ref >60)
Glucose, Bld: 145 mg/dL — ABNORMAL HIGH (ref 70–99)
Potassium: 5.5 mmol/L — ABNORMAL HIGH (ref 3.5–5.1)
Sodium: 138 mmol/L (ref 135–145)

## 2021-02-09 MED ORDER — ENTRESTO 24-26 MG PO TABS: 1.0000 | ORAL_TABLET | Freq: Two times a day (BID) | ORAL | 11 refills | Status: DC

## 2021-02-09 NOTE — Progress Notes (Signed)
Medication Samples have been provided to the patient.  Drug name: Delene Loll       Strength: 24-26mg         Qty: 4  LOT: [id number]  Exp.Date: 05/2023  Dosing instructions: 1 tab po bid  The patient has been instructed regarding the correct time, dose, and frequency of taking this medication, including desired effects and most common side effects.   Ceclia Koker R Sierrah Luevano 7:34 PM 02/09/2021

## 2021-02-09 NOTE — Telephone Encounter (Signed)
Opened in error

## 2021-02-09 NOTE — Patient Instructions (Signed)
Labs done today. We will contact you only if your labs are abnormal.  STOP taking Valsartan  START Entresto 24-26mg  (1 tablet) by mouth 2 times daily.  No other medication changes were made. Please continue all current medications as prescribed.  Your physician recommends that you schedule a follow-up appointment in: 10 days for a lab only appointment and in 2 months with Dr. Aundra Dubin  If you have any questions or concerns before your next appointment please send [place] a message through [facility] or call our office at [phone].    TO LEAVE A MESSAGE FOR THE NURSE SELECT OPTION 2, PLEASE LEAVE A MESSAGE INCLUDING: YOUR NAME DATE OF BIRTH CALL BACK NUMBER REASON FOR CALL**this is important as we prioritize the call backs  YOU WILL RECEIVE A CALL BACK THE SAME DAY AS LONG AS YOU CALL BEFORE 4:00 PM   Do the following things EVERYDAY: Weigh yourself in the morning before breakfast. Write it down and keep it in a log. Take your medicines as prescribed Eat low salt foods--Limit salt (sodium) to 2000 mg per day.  Stay as active as you can everyday Limit all fluids for the day to less than 2 liters   At the [facility], you and your health needs are our priority. As part of our continuing mission to provide you with exceptional heart care, we have created designated Provider Care Teams. These Care Teams include your primary Cardiologist (physician) and Advanced Practice Providers (APPs- Physician Assistants and Nurse Practitioners) who all work together to provide you with the care you need, when you need it.   You may see any of the following providers on your designated Care Team at your next follow up: Dr Glori Bickers Dr Haynes Kerns, NP Lyda Jester, [place] Audry Riles, PharmD   Please be sure to bring in all your medications bottles to every appointment.

## 2021-02-10 NOTE — Progress Notes (Signed)
Patient ID: Greg Robertson, male   DOB: [birth year], 71 y.o.   MRN: [id number] PCP: Dettinger, Fransisca Kaufmann, MD Cardiology: Dr. Aundra Dubin  70 y.o. with history of HTN, type I diabetes, and paroxysmal atrial fibrillation/CVA was referred by Dr. Shan Levans for evaluation of cardiomyopathy.  Patient also has had colon cancer with ileocolectomy in 2/13.  In 11/13, he fell and broke his clavicle (tripped).  Soon after this, he was admitted with DKA and profound dehydration/metabolic derangement.  He was noted to be in atrial fibrillation with RVR.  His cardiac enzymes were elevated, troponin peaked at 1.57. He initially went back into NSR but then had recurrent episodes of atrial fibrillation.  He was also started on Eliquis due to risk of embolic CVA, however he subsequently stopped Eliquis.   He was admitted in 6/22 with right PCA CVA and hemorrhagic conversion.  Initially just started on ASA, later this was stopped and Xarelto started.  Main residual from CVA is mild left upper visual field impairment.   CTA neck this admission showed < 50% bilateral carotid stenosis.  Echo showed EF 30-35%, diffuse hypokinesis.  RHC/LHC in 8/22 showed nonobstructive CAD and normal filling pressures.   Patient returns for followup of CHF.  Weight is up but he is eating better now that he is living at a SNF.  No significant exertional dyspnea.  No chest pain.  No orthopnea/PND.  Overall, feels good.    ECG: NSR, lateral TWIs (personally reviewed)  Labs (12/13): TSH normal, HCT 39.1, LFTs normal, K 4.2, creatinine 1.1 Labs (7/22): K 5.6, creatinine 1.17 Labs (8/22): BNP 283, LDL 67, HDL 58, K 5.3, creatinine 1.34 Labs (10/22): K 5.1, creatinine 1.24  PMH: 1. HTN 2. Type I diabetes 3. Anemia, thrombocytopenia, leukopenia: related to chemotherapy.  4. Clavicular fracture 11/13 due to mechanical fall.  5. 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  FOLFOX chemotherapy 3/13 - 9/13.  7. Allergic rhinitis 8.  Paroxysmal atrial fibrillation: First diagnosed in 11/13 during admission for DKA. Echo (11/13): EF 55% with grade I diastolic dysfunction.  9. Right central retinal artery occlusion 10. 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  - CTA neck with <50% carotid stenosis.  11. Cardiomyopathy: Nonischemic cardiomyopathy.  Echo in 6/22 with EF 30-35%, global hypokinesis, normal RV.   - RHC/LHC (8/22): 40% ostial/proximal LAD, D1 50%.  Mean RA 2, PA 29/9, mean PCWP 8, CI 2.94.   SH: Currently living in SNF.  Quit smoking in 1995.  H/o marijuana.  Quit drinking ETOH in 2022.    FH: No atrial fibrillation or CAD.   ROS: All systems reviewed and negative except as per HPI.   Current Outpatient Medications  Medication Sig Dispense Refill   acetaminophen (TYLENOL) 500 MG tablet Take 1,000 mg by mouth every 6 (six) hours as needed for mild pain or headache. Pain     atorvastatin (LIPITOR) 40 MG tablet Take 1 tablet (40 mg total) by mouth daily. 30 tablet 3   carvedilol (COREG) 12.5 MG tablet Take 1 tablet (12.5 mg total) by mouth 2 (two) times daily. 60 tablet 11   Cholecalciferol (VITAMIN D3) 50 MCG (2000 UT) TABS Take 2,000 Units by mouth in the morning. (0800)     Continuous Blood Gluc Receiver (FREESTYLE LIBRE 2 READER) DEVI USE IN THE MORNING, AT NOON, IN THE EVENING, AND AT BEDTIME AS DIRECTED Dx E10.9 1 each 1   Continuous Blood Gluc Sensor (FREESTYLE LIBRE 2 SENSOR) MISC 1  each by Does not apply route every 14 (fourteen) days 6 each 3   furosemide (LASIX) 20 MG tablet Take 1 tablet (20 mg total) by mouth daily as needed for fluid or edema. 30 tablet 5   glucose blood test strip Use as instructed 100 each 12   Insulin Aspart (NOVOLOG FLEXPEN [place]) Inject 9-11 Units into the skin daily with supper. No insulin if BS < 100 or not eating  Inject 11 units subcutaneously with breakfast, Inject 9 units subcutaneously with supper & inject 11 units subcutaneously with supper;     Insulin  Degludec (TRESIBA [place]) Inject 25 Units into the skin in the morning.     Insulin Pen Needle 32G X 4 MM MISC Use as directed 100 each 0   levocetirizine (XYZAL) 5 MG tablet Take 1 tablet (5 mg total) by mouth daily. 90 tablet 3   LOKELMA 5 g packet MIX PACKET INTO 3 TABLESPOONS (45ML) OF WATER. STIR WELL AND DRINK ONCE DAILY. **ENSURE THE ENTIRE DOSE HAS BEEN TAKEN** 30 each 0   Multiple Vitamin (MULTIVITAMIN WITH MINERALS) TABS tablet Take 1 tablet by mouth daily. (0800)     sacubitril-valsartan (ENTRESTO) 24-26 MG Take 1 tablet by mouth 2 (two) times daily. 60 tablet 11   spironolactone (ALDACTONE) 25 MG tablet Take 0.5 tablets (12.5 mg total) by mouth daily. 45 tablet 3   triamcinolone (NASACORT) 55 MCG/ACT AERO nasal inhaler INSTILL 1 SPRAY IN EACH NOSTRIL ONCE OR TWICE DAILY AS NEEDED. 16.5 Bottle 11   vitamin C (ASCORBIC ACID) 500 MG tablet Take 500 mg by mouth in the morning. (0800)     XARELTO 20 MG TABS tablet TAKE ONE TABLET BY MOUTH EVERY EVENING WITH SUPPER 30 tablet 11   No current facility-administered medications for this encounter.    BP 128/60   Pulse 72   Wt 84.6 kg (186 lb 9.6 oz)   SpO2 99%   BMI 23.96 kg/m  General: NAD Neck: No JVD, no thyromegaly or thyroid nodule.  Lungs: Clear to auscultation bilaterally with normal respiratory effort. CV: Nondisplaced PMI.  Heart regular S1/S2, no S3/S4, no murmur.  No peripheral edema.  No carotid bruit.  Normal pedal pulses.  Abdomen: Soft, nontender, no hepatosplenomegaly, no distention.  Skin: Intact without lesions or rashes.  Neurologic: Alert and oriented x 3.  Psych: Normal affect. Extremities: No clubbing or cyanosis.  HEENT: Normal.   Assessment/Plan: 1. Atrial fibrillation:  Paroxysmal. No recent symptoms.  He is in NSR today.  - Continue Xarelto 20 mg daily.  - If atrial fibrillation recurs frequently, could use anti-arrhythmic versus ablation.  2. Chronic systolic CHF: Nonischemic cardiomyopathy.  Echo in 6/22  at time of CVA showed EF 30-35%, diffuse hypokinesis.  LHC/RHC in 8/22 showed nonobstructive CAD and normal filling pressures.  Cause is uncertain.  Possible stress cardiomyopathy in the setting of CVA.  Diabetes itself can cause a cardiomyopathy. On exam, he is not volume overloaded with NYHA class II symptoms.  - Continue Coreg 6.25 mg bid.  - Continue spironolactone 12.5 mg daily.  He is on Lokelma to control K.  BMET today, can increase Lokelma if needed.  - BP is doing fine on valsartan, stop valsartan and start Entresto 24/26 bid. BMET today and in 10 days.  - Cardiac MRI to rule out infiltrative disease has been scheduled.  - No SGLT2 inhibitor with type 1 diabetes.  - Hopefully will see improvement in EF with GDMT.  If not, will need ICD.  Narrow QRS so not CRT candidate.  3. Hyperlipidemia: Continue atorvastatin, good lipids 8/22.  4. CAD: Nonobstructive on cath 8/22.  - Continue statin.  - No ASA given Xarelto use.   Followup 2 months.   Loralie Champagne 02/10/2021

## 2021-02-12 NOTE — Telephone Encounter (Signed)
Called [employer] to check the status of the patient's application. The patient was previously temporarily approved, [employer] wants the patient to apply for and be denied Medicaid in order to get a full approval. My argument is that the patient is already a Medicare participant.  Representative stated that the patient's application is going to have to be escalated to a manager, as the patient has been approved already for next year. This years application is what needs to be resolved.  Will follow up.

## 2021-02-16 ENCOUNTER — Telehealth (HOSPITAL_COMMUNITY): Payer: Self-pay | Admitting: Emergency Medicine

## 2021-02-16 NOTE — Telephone Encounter (Signed)
Reaching out to patient to offer assistance regarding upcoming cardiac imaging study; pt verbalizes understanding of appt date/time, parking situation and where to check in, and verified current allergies; name and call back number provided for further questions should they arise Marchia Bond RN Navigator Cardiac Imaging [facility] [phone] office [phone] cell   Pt understands the need to remove freestyle libre prior to CMR  Denies claustro Denies iv issues

## 2021-02-16 NOTE — Telephone Encounter (Signed)
Attempted to call patient regarding upcoming cardiac MR appointment. Left message on voicemail with name and callback number Brittish Bolinger RN Navigator Cardiac Imaging [facility] [phone] Office [phone] Cell  

## 2021-02-18 ENCOUNTER — Ambulatory Visit (HOSPITAL_COMMUNITY)
Admission: RE | Admit: 2021-02-18 | Discharge: 2021-02-18 | Disposition: A | Discharge status: Home / Self Care | Payer: Medicare Other | Source: Ambulatory Visit | Attending: Cardiology | Admitting: Cardiology

## 2021-02-18 ENCOUNTER — Other Ambulatory Visit: Payer: Self-pay

## 2021-02-18 DIAGNOSIS — I5032 Chronic diastolic (congestive) heart failure: Secondary | ICD-10-CM | POA: Insufficient documentation

## 2021-02-18 IMAGING — MR MR CARD MORPHOLOGY WO/W CM
44 of 48 series · 44 of 48 positions shown · IV contrast (gadavist)
Comparison: none

CLINICAL DATA: Cardiomyopathy of uncertain etiology

EXAM:
CARDIAC MRI
TECHNIQUE: The patient was scanned on a 1.5 Tesla GE magnet. A dedicated
cardiac coil was used. Functional imaging was done using Fiesta
sequences. [DATE], and 4 chamber views were done to assess for RWMA's.
Modified SADI rule using a short axis stack was used to
calculate an ejection fraction on a dedicated work station using
Circle software. The patient received 8 cc of Gadavist. After 10
minutes inversion recovery sequences were used to assess for
infiltration and scar tissue.

[Series 4: [id number] · axial · 8.0mm · 1.48mm/px · 1 of 16 slices shown]
[im 1/16]
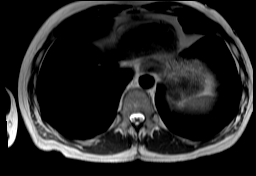

[Series 8: bSSFP · oblique · 8.0mm · 1.61mm/px · 1 of 25 slices shown (1 of 29)]
[im 1/25]
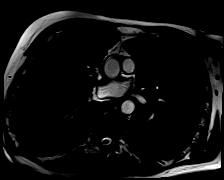

[Series 9: bSSFP · oblique · 8.0mm · 1.61mm/px · 1 of 25 slices shown (2 of 29)]
[im 1/25]
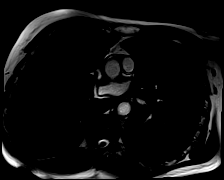

[Series 10: bSSFP · oblique · 8.0mm · 1.61mm/px · 1 of 25 slices shown (3 of 29)]
[im 1/25]
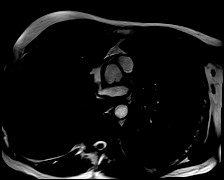

[Series 11: bSSFP · oblique · 8.0mm · 1.61mm/px · 1 of 25 slices shown (4 of 29)]
[im 1/25]
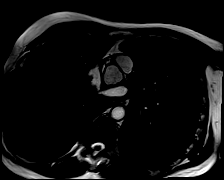

[Series 12: bSSFP · oblique · 8.0mm · 1.61mm/px · 1 of 25 slices shown (5 of 29)]
[im 1/25]
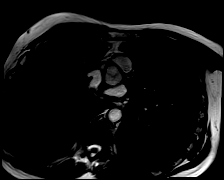

[Series 13: bSSFP · oblique · 8.0mm · 1.61mm/px · 1 of 25 slices shown (6 of 29)]
[im 1/25]
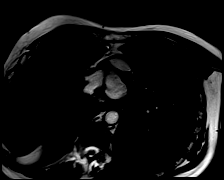

[Series 14: bSSFP · oblique · 8.0mm · 1.61mm/px · 1 of 25 slices shown (7 of 29)]
[im 1/25]
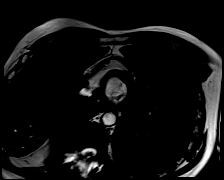

[Series 15: bSSFP · oblique · 8.0mm · 1.61mm/px · 1 of 25 slices shown (8 of 29)]
[im 1/25]
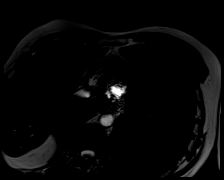

[Series 16: bSSFP · oblique · 8.0mm · 1.61mm/px · 1 of 25 slices shown (9 of 29)]
[im 1/25]
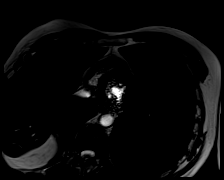

[Series 17: bSSFP · oblique · 8.0mm · 1.61mm/px · 1 of 25 slices shown (10 of 29)]
[im 1/25]
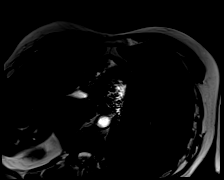

[Series 18: bSSFP · oblique · 8.0mm · 1.61mm/px · 1 of 25 slices shown (11 of 29)]
[im 1/25]
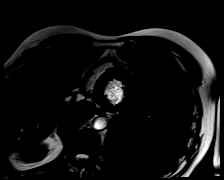

[Series 19: bSSFP · oblique · 8.0mm · 1.61mm/px · 1 of 25 slices shown (12 of 29)]
[im 1/25]
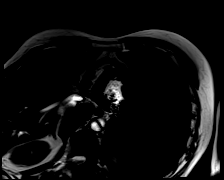

[Series 20: bSSFP · oblique · 8.0mm · 1.61mm/px · 1 of 25 slices shown (13 of 29)]
[im 1/25]
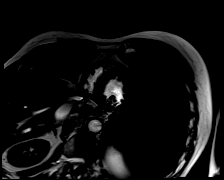

[Series 21: bSSFP · oblique · 8.0mm · 1.61mm/px · 1 of 25 slices shown (14 of 29)]
[im 1/25]
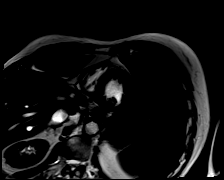

[Series 22: bSSFP · oblique · 8.0mm · 1.61mm/px · 1 of 25 slices shown (15 of 29)]
[im 1/25]
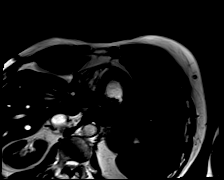

[Series 23: bSSFP · oblique · 8.0mm · 1.61mm/px · 1 of 25 slices shown (16 of 29)]
[im 1/25]
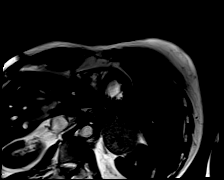

[Series 24: bSSFP · oblique · 8.0mm · 1.61mm/px · 1 of 25 slices shown (17 of 29)]
[im 1/25]
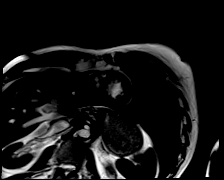

[Series 25: bSSFP · oblique · 8.0mm · 1.61mm/px · 1 of 25 slices shown (18 of 29)]
[im 1/25]
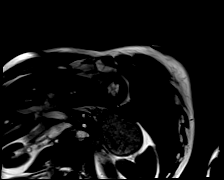

[Series 26: bSSFP · oblique · 8.0mm · 1.61mm/px · 1 of 25 slices shown (19 of 29)]
[im 1/25]
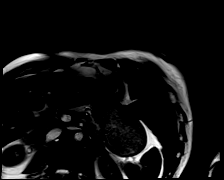

[Series 27: bSSFP · oblique · 8.0mm · 1.61mm/px · 1 of 25 slices shown (20 of 29)]
[im 1/25]
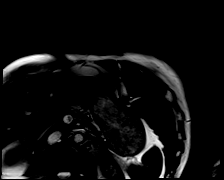

[Series 28: bSSFP · oblique · 8.0mm · 1.61mm/px · 1 of 25 slices shown (21 of 29)]
[im 1/25]
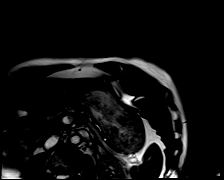

[Series 30: bSSFP · oblique · 8.0mm · 1.61mm/px · 1 of 25 slices shown (22 of 29)]
[im 1/25]
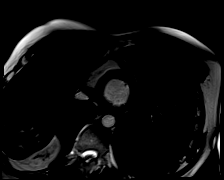

[Series 31: bSSFP · oblique · 8.0mm · 1.61mm/px · 1 of 25 slices shown (23 of 29)]
[im 1/25]
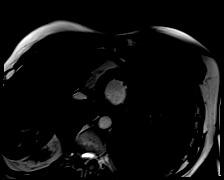

[Series 32: bSSFP · oblique · 8.0mm · 1.61mm/px · 1 of 25 slices shown (24 of 29)]
[im 1/25]
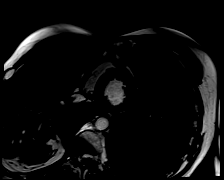

[Series 33: bSSFP · oblique · 8.0mm · 1.61mm/px · 1 of 25 slices shown (25 of 29)]
[im 1/25]
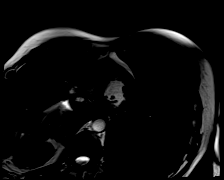

[Series 34: bSSFP · oblique · 8.0mm · 1.61mm/px · 1 of 25 slices shown (26 of 29)]
[im 1/25]
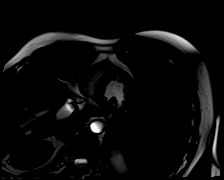

[Series 35: (id)_long_t1 · oblique · 8.0mm · 1.56mm/px · 1 of 24 slices shown]
[im 1/24]
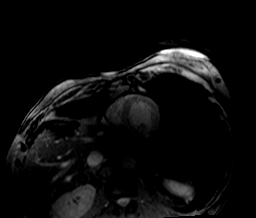

[Series 36: (id)_long_t1_moco · oblique · 8.0mm · 1.56mm/px · 1 of 24 slices shown]
[im 1/24]
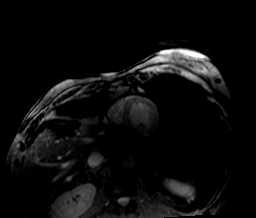

[Series 37: (id)_long_t1_moco_t1 · 1 of 3 slices shown (1 of 2)]
[im 1/3]
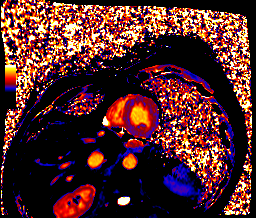

[Series 37: (id)_long_t1_moco_t1 · oblique · 8.0mm · 1.56mm/px · 1 of 3 slices shown (2 of 2)]
[im 1/3]
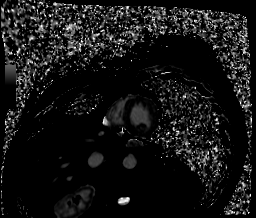

[Series 39: (id)_trufi · oblique · 8.0mm · 2.08mm/px · 1 of 9 slices shown]
[im 1/9]
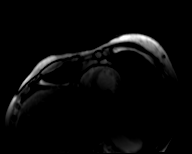

[Series 40: (id)_trufi_moco · oblique · 8.0mm · 2.08mm/px · 1 of 9 slices shown]
[im 1/9]
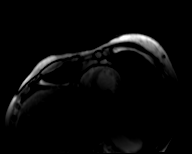

[Series 41: (id)_trufi_moco_t2 · oblique · 8.0mm · 2.08mm/px · 1 of 3 slices shown]
[im 1/3]
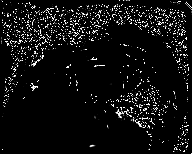

[Series 44: bSSFP · oblique · 6.0mm · 1.41mm/px · 1 of 25 slices shown (27 of 29)]
[im 1/25]
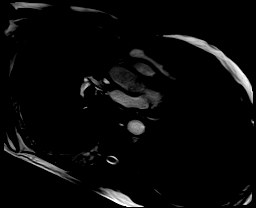

[Series 45: bSSFP · oblique · 6.0mm · 1.41mm/px · 1 of 25 slices shown (28 of 29)]
[im 1/25]
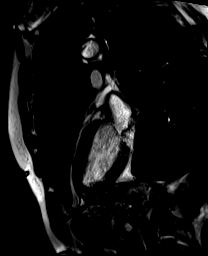

[Series 46: bSSFP · coronal · 6.0mm · 1.48mm/px · 1 of 25 slices shown (29 of 29)]
[im 1/25]
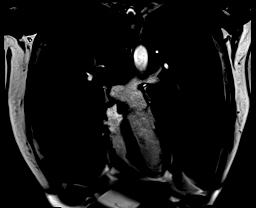

[Series 47: pre short axis · oblique · non-contrast · 8.0mm · 2.25mm/px · 1 of 10 slices shown (1 of 4)]
[im 1/10]
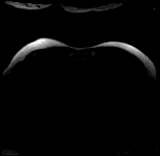

[Series 48: pre short axis · oblique · non-contrast · 8.0mm · 2.25mm/px · 1 of 5 slices shown (2 of 4)]
[im 1/5]
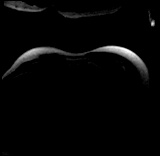

[Series 50: pre short axis · oblique · non-contrast · 8.0mm · 2.25mm/px · 1 of 10 slices shown (3 of 4)]
[im 1/10]
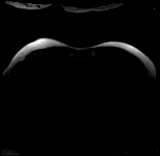

[Series 51: pre short axis · oblique · non-contrast · 8.0mm · 2.25mm/px · 1 of 3 slices shown (4 of 4)]
[im 1/3]
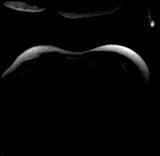

[Series 53: rest short axis · oblique · 8.0mm · 2.25mm/px · 1 of 60 slices shown (1 of 3)]
[im 1/60]
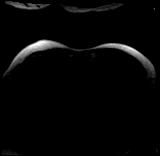

[Series 54: rest short axis · oblique · 8.0mm · 2.25mm/px · 1 of 60 slices shown (2 of 3)]
[im 1/60]
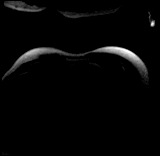

[Series 55: rest short axis · oblique · 8.0mm · 2.25mm/px · 1 of 52 slices shown (3 of 3)]
[im 1/52]
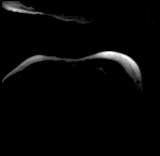

[44 of 48 positions shown; findings below may reference images not displayed]

FINDINGS: Limited images of the lung fields show no gross abnormalities.

Normal left ventricular size with mild LV hypertrophy. Global
hypokinesis, EF 38%. Normal right ventricular size and systolic
function, EF 46%. Normal left and right atrial sizes. Trileaflet
aortic valve, no significant stenosis or regurgitation. No
significant mitral regurgitation noted.

Delayed enhancement images were difficult with significant artifact.
There appeared to be mid-wall basal inferolateral late gadolinium
enhancement (LGE).

MEASUREMENTS:
MEASUREMENTS
LVEDV 123 mL

LVSV 47 mL
LVEF 38%

RVEDV 74 mL
RVSV 40 mL
RVEF 46%

ECV measurements in septum range from 37-43%
IMPRESSION: 1. Technically difficult study with significant respiratory
artifact.

2. Normal LV size with mild LV hypertrophy, EF 38% with diffuse
hypokinesis.

3.  Normal RV size with EF 46%.

4. Difficult delayed enhancement images, but possible basal
inferolateral mid-wall LGE.

5. Elevated extracellular volume percentage, ranging from 37-46% on
different cuts.

Possible prior viral myocarditis, but cardiac amyloidosis is also a
concern with high ECV percentage. Would rule this out.

SADI

## 2021-02-18 MED ORDER — GADOBUTROL 1 MMOL/ML IV SOLN
12.0000 mL | Freq: Once | INTRAVENOUS | Status: AC | PRN
  Administered 2021-02-18: 12 mL via INTRAVENOUS

## 2021-02-22 ENCOUNTER — Telehealth (HOSPITAL_COMMUNITY): Payer: Self-pay

## 2021-02-22 ENCOUNTER — Telehealth: Payer: Self-pay

## 2021-02-22 ENCOUNTER — Ambulatory Visit (INDEPENDENT_AMBULATORY_CARE_PROVIDER_SITE_OTHER): Payer: Medicare Other | Admitting: Gastroenterology

## 2021-02-22 ENCOUNTER — Encounter: Payer: Self-pay | Admitting: Gastroenterology

## 2021-02-22 VITALS — BP 102/60 | HR 79 | Ht 74.5 in | Wt 188.0 lb

## 2021-02-22 DIAGNOSIS — Z1509 Genetic susceptibility to other malignant neoplasm: Secondary | ICD-10-CM

## 2021-02-22 DIAGNOSIS — I5022 Chronic systolic (congestive) heart failure: Secondary | ICD-10-CM

## 2021-02-22 DIAGNOSIS — Z86010 Personal history of colonic polyps: Secondary | ICD-10-CM | POA: Diagnosis not present

## 2021-02-22 NOTE — Telephone Encounter (Signed)
[facility] Pre-operative Risk Assessment     Request for surgical clearance:     Endoscopy Procedure  What type of surgery is being performed?     Colonoscopy  When is this surgery scheduled?     04-08-21  What type of clearance is required ?   Pharmacy  Are there any medications that need to be held prior to surgery and how long? Xarelto x 2 days  Practice name and name of physician performing surgery?      [facility]  What is your office phone and fax number?      Phone- [phone]  [phone]  Anesthesia type (None, local, MAC, general) ?       MAC

## 2021-02-22 NOTE — Telephone Encounter (Signed)
Clinical pharmacist to review Xarelto.  Patient has a history of PAF and CVA.

## 2021-02-22 NOTE — Telephone Encounter (Signed)
Lmtrc, lab orders entered, patient has a pending lab appt 11/10  Orders Placed This Encounter  Procedures   NM CARDIAC AMYLOID TUMOR LOC INFLAM SPECT 1 DAY    Standing Status:   Future    Standing Expiration Date:   02/22/2022    Order Specific Question:   If indicated for the ordered procedure, I authorize the administration of a radiopharmaceutical per Radiology protocol    Answer:   Yes    Order Specific Question:   Preferred imaging location?    Answer:   [facility]    Order Specific Question:   Radiology Contrast Protocol - do NOT remove file path    Answer:   [email or web address]   Multiple Myeloma Panel (SPEP&IFE w/QIG)    Standing Status:   Future    Standing Expiration Date:   02/22/2022    Order Specific Question:   Release to patient    Answer:   Immediate   Immunofixation, urine    Standing Status:   Future    Standing Expiration Date:   02/22/2022    Order Specific Question:   Release to patient    Answer:   Immediate

## 2021-02-22 NOTE — Progress Notes (Signed)
Review of pertinent gastrointestinal problems: 1.  T3N1Colon cancer diagnosed during colonoscopy 2012 Dr. Erskine Emery.  Cancer in the cecum.  He underwent a right colectomy February 2013, then adjuvant FOLFOX.  Tumor pathology confirmed microsatellite unstable.  Loss of expression of PMS 2.  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  He has Lynch syndrome.  Colonoscopy February 2014 showed no polyps.  Colonoscopy 08/2014 showed no polyps. EGD October 2019 nonspecific distal gastritis.  14 mm duodenal polyp noted and removed, site was tattooed with spot submucosal injection.  This was a typical adenoma without high-grade dysplasia.  He was recommended to have repeat upper endoscopy 3 months later.  Reminder letter was sent February 2020 Colonoscopy October 2019 normal right sided ileocolonic anastomosis.  The examination was otherwise normal.  He was recommended to have colonoscopy 1 year later.  Reminder letter was sent September 2020.   HPI: This is a very pleasant 70 year old man  I last saw him a little over 3 years ago at the time of his colonoscopy and upper endoscopy.  We have been trying to get him back in here to repeat those test.  See above for further details.  He lives in an assisted living facility.  He is here with his sister today.  Since I last saw him a little over 3 years ago he has had no real GI issues.  Specifically no constipation, no bleeding, no significant diarrhea or abdominal pains.  He does have easy bruising since starting Xarelto for strokelike issues for 5 months ago.  Colon cancer does not run in his family  He tells me he had a terrible cough for 4 months which started very shortly after his EGD that I did for him a little over 3 years ago.  Old Data Reviewed:  He has significant heart disease.  Nonischemic cardiomyopathy.  I have reviewed his CHF clinic note from just 1 or 2 weeks ago.  This describes his atrial fibrillation on Xarelto.  His chronic  systolic CHF with an ejection fraction around 30 to 35%.  His cardiologist ordered an MRI to check for infiltrative disease of the heart.  I reviewed his MRI report and his cardiologist documented that this could "not rule out cardiac amyloidosis, would like him to have PYP scan and get myeloma panel and urine immunofixation."    Review of systems: Pertinent positive and negative review of systems were noted in the above HPI section. All other review negative.   Past Medical History:  Diagnosis Date   Allergic rhinitis    Anemia    Atrial fibrillation ([place])    Persistent   Cancer of ascending colon, 7cm 05/25/2011   Congestive heart failure (CHF) ([place])    Diabetes mellitus type I ([place])    Glaucoma    Hypertension    Pneumonia 1979 or 1980   Prostate nodule    Substance abuse [facility])     Past Surgical History:  Procedure Laterality Date   broken left shoulder blade and collar bone     FINGER SURGERY  2009   right middle   LAPAROSCOPIC ASSISTED 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     NASAL FRACTURE SURGERY  1968   PORTACATH PLACEMENT  07/01/2011   Procedure: INSERTION PORT-A-CATH;  Surgeon: Adin Hector, MD;  Location: [facility];  Service: General;  Laterality: Left;  Insertion of Port-A-Catheter Left Internal Jugular   RIGHT/LEFT HEART CATH AND CORONARY ANGIOGRAPHY N/A 12/08/2020   Procedure: RIGHT/LEFT HEART CATH AND  CORONARY ANGIOGRAPHY;  Surgeon: Larey Dresser, MD;  Location: [facility];  Service: Cardiovascular;  Laterality: N/A;   TONSILLECTOMY  1957 - approximate    Current Outpatient Medications  Medication Sig Dispense Refill   acetaminophen (TYLENOL) 500 MG tablet Take 1,000 mg by mouth every 6 (six) hours as needed for mild pain or headache. Pain     atorvastatin (LIPITOR) 40 MG tablet Take 1 tablet (40 mg total) by mouth daily. 30 tablet 3   carvedilol (COREG) 12.5 MG tablet Take 1 tablet (12.5 mg total) by mouth 2 (two) times daily. 60 tablet 11    Cholecalciferol (VITAMIN D3) 50 MCG (2000 UT) TABS Take 2,000 Units by mouth in the morning. (0800)     Continuous Blood Gluc Receiver (FREESTYLE LIBRE 2 READER) DEVI USE IN THE MORNING, AT NOON, IN THE EVENING, AND AT BEDTIME AS DIRECTED Dx E10.9 1 each 1   Continuous Blood Gluc Sensor (FREESTYLE LIBRE 2 SENSOR) MISC 1 each by Does not apply route every 14 (fourteen) days 6 each 3   furosemide (LASIX) 20 MG tablet Take 1 tablet (20 mg total) by mouth daily as needed for fluid or edema. 30 tablet 5   glucose blood test strip Use as instructed 100 each 12   Insulin Aspart (NOVOLOG FLEXPEN [place]) Inject 9-11 Units into the skin daily with supper. No insulin if BS < 100 or not eating  Inject 11 units subcutaneously with breakfast, Inject 9 units subcutaneously with supper & inject 11 units subcutaneously with supper;     Insulin Degludec (TRESIBA [place]) Inject 25 Units into the skin in the morning.     Insulin Pen Needle 32G X 4 MM MISC Use as directed 100 each 0   levocetirizine (XYZAL) 5 MG tablet Take 1 tablet (5 mg total) by mouth daily. 90 tablet 3   LOKELMA 5 g packet MIX PACKET INTO 3 TABLESPOONS (45ML) OF WATER. STIR WELL AND DRINK ONCE DAILY. **ENSURE THE ENTIRE DOSE HAS BEEN TAKEN** 30 each 0   Multiple Vitamin (MULTIVITAMIN WITH MINERALS) TABS tablet Take 1 tablet by mouth daily. (0800)     sacubitril-valsartan (ENTRESTO) 24-26 MG Take 1 tablet by mouth 2 (two) times daily. 60 tablet 11   spironolactone (ALDACTONE) 25 MG tablet Take 0.5 tablets (12.5 mg total) by mouth daily. 45 tablet 3   triamcinolone (NASACORT) 55 MCG/ACT AERO nasal inhaler INSTILL 1 SPRAY IN EACH NOSTRIL ONCE OR TWICE DAILY AS NEEDED. 16.5 Bottle 11   vitamin C (ASCORBIC ACID) 500 MG tablet Take 500 mg by mouth in the morning. (0800)     XARELTO 20 MG TABS tablet TAKE ONE TABLET BY MOUTH EVERY EVENING WITH SUPPER 30 tablet 11   No current facility-administered medications for this visit.    Allergies as of 02/22/2021 -  Review Complete 02/22/2021  Allergen Reaction Noted   Oxycodone Nausea And Vomiting 03/13/2012    Family History  Problem Relation Age of Onset   Colon polyps Father    Lung cancer Father    Diabetes Father    Prostate cancer Father    Breast cancer Mother    Pancreatitis Mother        intestinal adhesions   Insomnia Mother    Colon cancer Neg Hx    Rectal cancer Neg Hx     Social History   Socioeconomic History   Marital status: Single    Spouse name: Not on file   Number of children: 0  Years of education: 6   Highest education level: Bachelor's degree (e.g., BA, AB, BS)  Occupational History   Occupation: Retired    Comment: Press photographer  Tobacco Use   Smoking status: Former    Packs/day: 1.00    Years: 15.00    Pack years: 15.00    Types: Cigarettes    Quit date: 04/18/1993    Years since quitting: 27.8   Smokeless tobacco: Never   Tobacco comments:    marijuana every night   Vaping Use   Vaping Use: Never used  Substance and Sexual Activity   Alcohol use: Not Currently    Alcohol/week: 5.0 standard drinks    Types: 3 Cans of beer, 2 Shots of liquor per week    Comment: 1 drink every 2 days   Drug use: Not Currently    Types: Marijuana    Comment: once a night marijuana   Sexual activity: Not Currently  Other Topics Concern   Not on file  Social History Narrative   Lives alone.  Retired from [employer].    Social Determinants of Health   Financial Resource Strain: Low Risk    Difficulty of Paying Living Expenses: Not hard at all  Food Insecurity: No Food Insecurity   Worried About Charity fundraiser in the Last Year: Never true   [place] in the Last Year: Never true  Transportation Needs: No Transportation Needs   Lack of Transportation (Medical): No   Lack of Transportation (Non-Medical): No  Physical Activity: Not on file  Stress: Not on file  Social Connections: Not on file  Intimate Partner Violence: Not on file     Physical  Exam: BP 102/60   Pulse 79   Ht 6' 2.5" (1.892 m)   Wt 188 lb (85.3 kg)   BMI 23.81 kg/m  Constitutional: generally well-appearing Psychiatric: alert and oriented x3 Eyes: extraocular movements intact Mouth: oral pharynx moist, no lesions Neck: supple no lymphadenopathy Cardiovascular: heart regular rate and rhythm Lungs: clear to auscultation bilaterally Abdomen: soft, nontender, nondistended, no obvious ascites, no peritoneal signs, normal bowel sounds Extremities: no lower extremity edema bilaterally Skin: no lesions on visible extremities   Assessment and plan: 70 y.o. male with Lynch syndrome, personal history of colon cancer and adenomatous duodenal polyp  We had a nice discussion about Lynch syndrome and he understands that he is due for colonoscopy and also upper endoscopy.  He is quite reluctant to undergo repeat upper endoscopy given his significant 4 months long cough which she suffered after his endoscopy that I did for him 3 years ago.  I explained to him that that is a very unusual problem to have following upper endoscopy.  In the end he agreed to have these tests repeated, it would certainly be safest to do the hospital given his significant cardiac disease.  He understands he will need to hold his Xarelto for 2 days prior to the colonoscopy and we will make sure that his cardiologist is okay with that recommendation  I see that he is being worked up for amyloidosis and if it is still important information I will biopsy his rectum as that is often a good way to diagnose amyloidosis.  Please see the "Patient Instructions" section for addition details about the plan.   Owens Loffler, MD [facility] 02/22/2021, 9:26 AM  Cc: Dettinger, Fransisca Kaufmann, MD  Total time on date of encounter was  45 minutes (this included time spent preparing to see  the patient reviewing records; obtaining and/or reviewing separately obtained history; performing a medically appropriate  exam and/or evaluation; counseling and educating the patient and family if present; ordering medications, tests or procedures if applicable; and documenting clinical information in the health record).

## 2021-02-22 NOTE — Patient Instructions (Signed)
If you are age 70 or older, your body mass index should be between 23-30. Your Body mass index is 23.81 kg/m. If this is out of the aforementioned range listed, please consider follow up with your Primary Care Provider. ________________________________________________________  [facility] providers would like to encourage you to use [facility] to communicate with providers for non-urgent requests or questions.  Due to long hold times on the telephone, sending your provider a message by [facility] may be a faster and more efficient way to get a response.  Please allow 48 business hours for a response.  Please remember that this is for non-urgent requests.  _______________________________________________________  Dennis Bast have been scheduled for an endoscopy and colonoscopy. Please follow the written instructions given to you at your visit today. Please pick up your prep supplies at the pharmacy within the next 1-3 days. If you use inhalers (even only as needed), please bring them with you on the day of your procedure.   Due to recent changes in healthcare laws, you may see the results of your imaging and laboratory studies on MyChart before your provider has had a chance to review them.  We understand that in some cases there may be results that are confusing or concerning to you. Not all laboratory results come back in the same time frame and the provider may be waiting for multiple results in order to interpret others.  Please give [place] 48 hours in order for your provider to thoroughly review all the results before contacting the office for clarification of your results.   You will be contacted by our office prior to your procedure for directions on holding your Xarelto.  If you do not hear from our office 1 week prior to your scheduled procedure, please call [phone] to discuss.   Thank you for entrusting me with your care and choosing [facility].  Dr Ardis Hughs

## 2021-02-22 NOTE — Telephone Encounter (Signed)
-----   Message from Larey Dresser, MD sent at 02/20/2021 12:02 AM EDT ----- EF low at 38%.  Appears above ICD range.  Cannot rule out cardiac amyloidosis, would like him to have PYP scan and get myeloma panel and urine immunofixation.

## 2021-02-23 NOTE — Telephone Encounter (Signed)
Patient with diagnosis of afib on Xarelto for anticoagulation.    Procedure: colonoscopy Date of procedure: 04/08/21   CHA2DS2-VASc Score = 7   This indicates a 11.2% annual risk of stroke. The patient's score is based upon: CHF History: 1 HTN History: 1 Diabetes History: 1 Stroke History: 2 Vascular Disease History: 1 Age Score: 1 Gender Score: 0     CrCl 59 ml/min  Would prefer patient hold Xarelto 1 day prior to procedure due to hx of stroke. However, if Dr. Ardis Hughs feels a 2 day hold is needed, patient may hold Xarelto for 2 days prior.

## 2021-02-23 NOTE — Telephone Encounter (Signed)
Will route back to the requesting surgeon's office.  They will contact [facility] and advise the RN on medication instructions in preparation for pt's upcoming procedure.

## 2021-02-23 NOTE — Telephone Encounter (Signed)
    Patient Name: Greg Robertson  DOB: [birth year] MRN: [id number]  Primary Cardiologist: Dr. Aundra Dubin  Chart reviewed as part of pre-operative protocol coverage. Given past medical history and time since last visit, based on ACC/AHA guidelines, Greg Robertson would be at acceptable risk for the planned procedure without further cardiovascular testing.   The patient was advised that if he develops new symptoms prior to surgery to contact our office to arrange for a follow-up visit, and he verbalized understanding.  See below at the recommendation by our clinical pharmacist. Note, since patient resides in [facility] facility, he mentioned the facility manage his medication. Medication instruction will need to be given to the facility nurse. Please notify the facility regarding how long to hold Xarelto.   I will route this recommendation to the requesting party via Epic fax function and remove from pre-op pool.  Please call with questions.  [place], [place] 02/23/2021, 3:27 PM

## 2021-02-24 NOTE — Telephone Encounter (Signed)
Patient advised that he has been given clearance to hold Xarelto 1 day prior to colonoscopy scheduled for 04-08-21.  Patient advised to take last dose of Xarelto on 04-06-21, and he will be advised when to restart Xarelto by Dr Ardis Hughs after the procedure.  Patient agreed to plan and verbalized understanding.  No further questions.   Spoke with Evlyn Clines at [facility] [phone] to give her verbal orders on how to hold Xarelto.  Porsha requested written order for instructions to be faxed to [phone].  A copy was faxed.

## 2021-02-24 NOTE — Telephone Encounter (Signed)
Would prefer patient hold Xarelto 1 day prior to procedure due to hx of stroke. However, if Dr. Ardis Hughs feels a 2 day hold is needed, patient may hold Xarelto for 2 days prior.

## 2021-02-25 ENCOUNTER — Ambulatory Visit (HOSPITAL_COMMUNITY)
Admission: RE | Admit: 2021-02-25 | Discharge: 2021-02-25 | Disposition: A | Discharge status: Home / Self Care | Payer: Medicare Other | Source: Ambulatory Visit | Attending: Cardiology | Admitting: Cardiology

## 2021-02-25 ENCOUNTER — Other Ambulatory Visit: Payer: Self-pay

## 2021-02-25 DIAGNOSIS — I5022 Chronic systolic (congestive) heart failure: Secondary | ICD-10-CM | POA: Insufficient documentation

## 2021-02-25 LAB — BASIC METABOLIC PANEL
Anion gap: 6 (ref 5–15)
BUN: 21 mg/dL (ref 8–23)
CO2: 28 mmol/L (ref 22–32)
Calcium: 9.2 mg/dL (ref 8.9–10.3)
Chloride: 107 mmol/L (ref 98–111)
Creatinine, Ser: 1.37 mg/dL — ABNORMAL HIGH (ref 0.61–1.24)
GFR, Estimated: 55 mL/min — ABNORMAL LOW (ref >60)
Glucose, Bld: 56 mg/dL — ABNORMAL LOW (ref 70–99)
Potassium: 4.9 mmol/L (ref 3.5–5.1)
Sodium: 141 mmol/L (ref 135–145)

## 2021-02-25 NOTE — Addendum Note (Signed)
Encounter addended by: Kerry Dory, CMA on: 02/25/2021 2:20 PM  Actions taken: Charge Capture section accepted

## 2021-03-01 ENCOUNTER — Telehealth: Payer: Self-pay | Admitting: Gastroenterology

## 2021-03-01 DIAGNOSIS — I482 Chronic atrial fibrillation, unspecified: Secondary | ICD-10-CM | POA: Diagnosis not present

## 2021-03-01 DIAGNOSIS — H2513 Age-related nuclear cataract, bilateral: Secondary | ICD-10-CM | POA: Diagnosis not present

## 2021-03-01 DIAGNOSIS — H348112 Central retinal vein occlusion, right eye, stable: Secondary | ICD-10-CM | POA: Diagnosis not present

## 2021-03-01 DIAGNOSIS — Z794 Long term (current) use of insulin: Secondary | ICD-10-CM | POA: Diagnosis not present

## 2021-03-01 DIAGNOSIS — H40033 Anatomical narrow angle, bilateral: Secondary | ICD-10-CM | POA: Diagnosis not present

## 2021-03-01 DIAGNOSIS — E119 Type 2 diabetes mellitus without complications: Secondary | ICD-10-CM | POA: Diagnosis not present

## 2021-03-01 DIAGNOSIS — H53462 Homonymous bilateral field defects, left side: Secondary | ICD-10-CM | POA: Diagnosis not present

## 2021-03-01 DIAGNOSIS — H02831 Dermatochalasis of right upper eyelid: Secondary | ICD-10-CM | POA: Diagnosis not present

## 2021-03-01 DIAGNOSIS — H25041 Posterior subcapsular polar age-related cataract, right eye: Secondary | ICD-10-CM | POA: Diagnosis not present

## 2021-03-01 DIAGNOSIS — H02834 Dermatochalasis of left upper eyelid: Secondary | ICD-10-CM | POA: Diagnosis not present

## 2021-03-01 DIAGNOSIS — J309 Allergic rhinitis, unspecified: Secondary | ICD-10-CM | POA: Diagnosis not present

## 2021-03-01 DIAGNOSIS — J449 Chronic obstructive pulmonary disease, unspecified: Secondary | ICD-10-CM | POA: Diagnosis not present

## 2021-03-01 DIAGNOSIS — H401134 Primary open-angle glaucoma, bilateral, indeterminate stage: Secondary | ICD-10-CM | POA: Diagnosis not present

## 2021-03-01 NOTE — Telephone Encounter (Signed)
Patient's sister called requesting to reschedule procedure sue to having cataract surgery on the same day.

## 2021-03-02 ENCOUNTER — Telehealth: Payer: Self-pay | Admitting: *Deleted

## 2021-03-02 LAB — IMMUNOFIXATION, URINE

## 2021-03-02 NOTE — Telephone Encounter (Signed)
   [facility] Pre-operative Risk Assessment    Patient Name: Greg Robertson  DOB: 05-25-1950 MRN: [id number]  HEARTCARE STAFF:  - IMPORTANT!!!!!! Under Visit Info/Reason for Call, type in Other and utilize the format Clearance MM/DD/YY or Clearance TBD. Do not use dashes or single digits. - Please review there is not already an duplicate clearance open for this procedure. - If request is for dental extraction, please clarify the # of teeth to be extracted. - If the patient is currently at the dentist's office, call Pre-Op Callback Staff (MA/nurse) to input urgent request.  - If the patient is not currently in the dentist office, please route to the Pre-Op pool.  Request for surgical clearance:  What type of surgery is being performed?  KAHOOK GONIOTOMY   When is this surgery scheduled?  12/22 & 04/22/21  What type of clearance is required (medical clearance vs. Pharmacy clearance to hold med vs. Both)?  BOTH  Are there any medications that need to be held prior to surgery and how long?  ASPIRIN / ON PAPERWORK, IT STATES HOWEVER LONG IS SAFE  Practice name and name of physician performing surgery?   [facility] DR. Katy Fitch  What is the office phone number?  [phone]   7.   What is the office fax number?  [phone]  8.   Anesthesia type (None, local, MAC, general) ?    Jeanann Lewandowsky 03/02/2021, 8:51 AM  _________________________________________________________________   (provider comments below)

## 2021-03-02 NOTE — Telephone Encounter (Signed)
Returned call to patient regarding rescheduling his procedure.  Patient stated that he needs to have cataract surgery and it was scheduled for 04-08-21.  Patient asked to reschedule procedures for another date.  Patient was offered 04-22-21, but he declined due to the fact he was having second eye surgery on that day.  Patient was then offered 04-29-21, and he accepted.  Patient instructions mailed to patient per his request.  Patient agreed to plan and verbalized understanding.

## 2021-03-04 NOTE — Telephone Encounter (Signed)
   Primary Cardiologist: None  Chart reviewed as part of pre-operative protocol coverage. Given past medical history and time since last visit, based on ACC/AHA guidelines, Greg Robertson would be at acceptable risk for the planned procedure without further cardiovascular testing.   Patient with diagnosis of afib on Xarelto for anticoagulation.     Procedure: kahook goniotomy Date of procedure: 04/08/21 and 04/22/21   CHA2DS2-VASc Score = 7  This indicates a 11.2% annual risk of stroke. The patient's score is based upon: CHF History: 1 HTN History: 1 Diabetes History: 1 Stroke History: 2 Vascular Disease History: 1 Age Score: 1 Gender Score: 0   CrCl 51mL/min Platelet count 128K   Per office protocol, patient can hold Xarelto for 1 day prior to each procedure. Resume as soon as safely possible after each procedure.  I will route this recommendation to the requesting party via Epic fax function and remove from pre-op pool.  Please call with questions.  Jossie Ng. Jadi Deyarmin NP-C    03/04/2021, 12:58 PM [place] [address] Office [phone] Fax [phone]

## 2021-03-04 NOTE — Telephone Encounter (Signed)
Patient with diagnosis of afib on Xarelto for anticoagulation.    Procedure: kahook goniotomy Date of procedure: 04/08/21 and 04/22/21  CHA2DS2-VASc Score = 7  This indicates a 11.2% annual risk of stroke. The patient's score is based upon: CHF History: 1 HTN History: 1 Diabetes History: 1 Stroke History: 2 Vascular Disease History: 1 Age Score: 1 Gender Score: 0   CrCl 46mL/min Platelet count 128K  Per office protocol, patient can hold Xarelto for 1 day prior to each procedure. Resume as soon as safely possible after each procedure.

## 2021-03-29 ENCOUNTER — Other Ambulatory Visit: Payer: Self-pay

## 2021-03-29 ENCOUNTER — Encounter: Payer: Self-pay | Admitting: Adult Health

## 2021-03-29 ENCOUNTER — Ambulatory Visit (INDEPENDENT_AMBULATORY_CARE_PROVIDER_SITE_OTHER): Payer: Medicare Other | Admitting: Adult Health

## 2021-03-29 VITALS — BP 120/61 | HR 68 | Ht 74.0 in | Wt 191.0 lb

## 2021-03-29 DIAGNOSIS — E785 Hyperlipidemia, unspecified: Secondary | ICD-10-CM

## 2021-03-29 DIAGNOSIS — I63431 Cerebral infarction due to embolism of right posterior cerebral artery: Secondary | ICD-10-CM

## 2021-03-29 DIAGNOSIS — I10 Essential (primary) hypertension: Secondary | ICD-10-CM

## 2021-03-29 DIAGNOSIS — I639 Cerebral infarction, unspecified: Secondary | ICD-10-CM | POA: Diagnosis not present

## 2021-03-29 DIAGNOSIS — I480 Paroxysmal atrial fibrillation: Secondary | ICD-10-CM | POA: Diagnosis not present

## 2021-03-29 DIAGNOSIS — E1165 Type 2 diabetes mellitus with hyperglycemia: Secondary | ICD-10-CM

## 2021-03-29 DIAGNOSIS — E782 Mixed hyperlipidemia: Secondary | ICD-10-CM | POA: Diagnosis not present

## 2021-03-29 NOTE — Progress Notes (Signed)
[facility] [address] [place] [address] [phone]       STROKE FOLLOW UP NOTE  Greg Robertson Date of Birth:  05/08/1950 Medical Record Number:  [id number]   Reason for Referral: stroke follow up    SUBJECTIVE:   CHIEF COMPLAINT:  Chief Complaint  Patient presents with   Follow-up    Rm  2 sister Greg Robertson  Pt is well and stable, no new concerns      HPI:   Update 03/22/2021 JM: Returns for 60-month stroke follow-up accompanied by his sister, Greg Robertson to reside at [facility].  Stable from stroke standpoint -denies new stroke/TIA symptoms Residual visual impairment stable. Seen by ophthalmology - scheduled for cataract removal OD 12/22 and OS 1/5.  On exam per pt, still some left sided peripheral vision loss - unable to view via epic   Compliant on Xarelto and atorvastatin -denies side effects Blood pressure today 120/61  Routinely followed by Dr. Aundra Dubin cardiologist   No new concerns at this time   History provided for reference purposes only Initial visit 11/12/2020 JM: Greg Robertson is being seen for hospital follow-up accompanied by his sister.  He has since been discharged from rehab on 10/20/2020 and currently residing at [facility] living.  Reports vision has been gradually improving -he is unable to notice any residual peripheral vision concerns.  He is currently being followed by ophthalmology for cataracts and has history of R CRAO with residual visual impairment.  Denies new stroke/TIA symptoms.  Repeat CT head showed improvement of prior bleed and was cleared to initiate [facility] -not yet been started as cardiologist Dr. Shan Levans due to difficulty contacting -he requests that this be further discussed at today's visit and potentially starting.  Discussed possibly initiating Eliquis - hx of intolerance reporting "boils in my nose, almost like large pimples. They would pop and then another one would show up. I stopped  taking it for a couple of months and they went away. When I tried to restart Eliquis, they came back again".  He has not previously trialed Xarelto or Pradaxa.  Per sister, he plans to establish care with Dr Aundra Dubin with initial visit on 8/16.  He is currently on aspirin 81 mg daily and atorvastatin 40 mg daily without associated side effects.  Blood pressure today 124/72.  No further concerns at this time.  Stroke admission 09/27/2020 [name] is a 70 y.o. male w/pmh of atrial fibrillation not on [facility], DM2 poorly controlled, former smoker, prior R CRAO, HTN who presented on 09/27/2020 with left sided vision loss.  Personally reviewed hospitalization pertinent progress notes, lab work and imaging summary.  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 fib not on AC vs reduced EF 30 to 35% in setting of HF.  Recommend initiating AC but deferred on starting due to significant hemorrhagic conversion recommended follow-up imaging 10 to 14 days prior to starting.  He was discharged on aspirin 81 mg daily and atorvastatin 40 mg daily.  LDL 128.  A1c 8.7.  HTN stable during admission.  He was discharged to [facility] living and rehab on 10/01/2020.       ROS:   14 system review of systems performed and negative with exception of decreased vision   PMH:  Past Medical History:  Diagnosis Date   Allergic rhinitis    Anemia    Atrial fibrillation (HCC)    Persistent  Cancer of ascending colon, 7cm 05/25/2011   Congestive heart failure (CHF) ([place])    Diabetes mellitus type I ([place])    Glaucoma    Hypertension    Pneumonia 1979 or 1980   Prostate nodule    Substance abuse ([place])     PSH:  Past Surgical History:  Procedure Laterality Date   broken left shoulder blade and collar bone     FINGER SURGERY  2009   right middle   LAPAROSCOPIC ASSISTED 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     NASAL FRACTURE SURGERY  1968    PORTACATH PLACEMENT  07/01/2011   Procedure: INSERTION PORT-A-CATH;  Surgeon: Adin Hector, MD;  Location: [facility];  Service: General;  Laterality: Left;  Insertion of Port-A-Catheter Left Internal Jugular   RIGHT/LEFT HEART CATH AND CORONARY ANGIOGRAPHY N/A 12/08/2020   Procedure: RIGHT/LEFT HEART CATH AND CORONARY ANGIOGRAPHY;  Surgeon: Larey Dresser, MD;  Location: [facility];  Service: Cardiovascular;  Laterality: N/A;   TONSILLECTOMY  1957 - approximate    Social History:  Social History   Socioeconomic History   Marital status: Single    Spouse name: Not on file   Number of children: 0   Years of education: 16   Highest education level: Bachelor's degree (e.g., BA, AB, BS)  Occupational History   Occupation: Retired    Comment: Press photographer  Tobacco Use   Smoking status: Former    Packs/day: 1.00    Years: 15.00    Pack years: 15.00    Types: Cigarettes    Quit date: 04/18/1993    Years since quitting: 27.9   Smokeless tobacco: Never   Tobacco comments:    marijuana every night   Vaping Use   Vaping Use: Never used  Substance and Sexual Activity   Alcohol use: Not Currently    Alcohol/week: 5.0 standard drinks    Types: 3 Cans of beer, 2 Shots of liquor per week    Comment: 1 drink every 2 days   Drug use: Not Currently    Types: Marijuana    Comment: once a night marijuana   Sexual activity: Not Currently  Other Topics Concern   Not on file  Social History Narrative   Lives alone.  Retired from [employer].    Social Determinants of Health   Financial Resource Strain: Low Risk    Difficulty of Paying Living Expenses: Not hard at all  Food Insecurity: No Food Insecurity   Worried About Charity fundraiser in the Last Year: Never true   [place] in the Last Year: Never true  Transportation Needs: No Transportation Needs   Lack of Transportation (Medical): No   Lack of Transportation (Non-Medical): No  Physical Activity: Not on file  Stress:  Not on file  Social Connections: Not on file  Intimate Partner Violence: Not on file    Family History:  Family History  Problem Relation Age of Onset   Colon polyps Father    Lung cancer Father    Diabetes Father    Prostate cancer Father    Breast cancer Mother    Pancreatitis Mother        intestinal adhesions   Insomnia Mother    Colon cancer Neg Hx    Rectal cancer Neg Hx     Medications:   Current Outpatient Medications on File Prior to Visit  Medication Sig Dispense Refill   acetaminophen (TYLENOL) 500 MG tablet Take 1,000 mg by  mouth every 6 (six) hours as needed for mild pain or headache. Pain     atorvastatin (LIPITOR) 40 MG tablet Take 1 tablet (40 mg total) by mouth daily. 30 tablet 3   carvedilol (COREG) 12.5 MG tablet Take 1 tablet (12.5 mg total) by mouth 2 (two) times daily. 60 tablet 11   Cholecalciferol (VITAMIN D3) 50 MCG (2000 UT) TABS Take 2,000 Units by mouth in the morning. (0800)     Continuous Blood Gluc Receiver (FREESTYLE LIBRE 2 READER) DEVI USE IN THE MORNING, AT NOON, IN THE EVENING, AND AT BEDTIME AS DIRECTED Dx E10.9 1 each 1   Continuous Blood Gluc Sensor (FREESTYLE LIBRE 2 SENSOR) MISC 1 each by Does not apply route every 14 (fourteen) days 6 each 3   furosemide (LASIX) 20 MG tablet Take 1 tablet (20 mg total) by mouth daily as needed for fluid or edema. 30 tablet 5   glucose blood test strip Use as instructed 100 each 12   Insulin Aspart (NOVOLOG FLEXPEN [place]) Inject 9-11 Units into the skin daily with supper. No insulin if BS < 100 or not eating  Inject 11 units subcutaneously with breakfast, Inject 9 units subcutaneously with supper & inject 11 units subcutaneously with supper;     Insulin Degludec (TRESIBA [place]) Inject 25 Units into the skin in the morning.     Insulin Pen Needle 32G X 4 MM MISC Use as directed 100 each 0   levocetirizine (XYZAL) 5 MG tablet Take 1 tablet (5 mg total) by mouth daily. 90 tablet 3   LOKELMA 5 g packet MIX PACKET  INTO 3 TABLESPOONS (45ML) OF WATER. STIR WELL AND DRINK ONCE DAILY. **ENSURE THE ENTIRE DOSE HAS BEEN TAKEN** 30 each 0   Multiple Vitamin (MULTIVITAMIN WITH MINERALS) TABS tablet Take 1 tablet by mouth daily. (0800)     sacubitril-valsartan (ENTRESTO) 24-26 MG Take 1 tablet by mouth 2 (two) times daily. 60 tablet 11   spironolactone (ALDACTONE) 25 MG tablet Take 0.5 tablets (12.5 mg total) by mouth daily. 45 tablet 3   triamcinolone (NASACORT) 55 MCG/ACT AERO nasal inhaler INSTILL 1 SPRAY IN EACH NOSTRIL ONCE OR TWICE DAILY AS NEEDED. 16.5 Bottle 11   vitamin C (ASCORBIC ACID) 500 MG tablet Take 500 mg by mouth in the morning. (0800)     XARELTO 20 MG TABS tablet TAKE ONE TABLET BY MOUTH EVERY EVENING WITH SUPPER 30 tablet 11   No current facility-administered medications on file prior to visit.    Allergies:   Allergies  Allergen Reactions   Oxycodone Nausea And Vomiting      OBJECTIVE:  Physical Exam  Vitals:   03/29/21 1340  BP: 120/61  Pulse: 68  Weight: 191 lb (86.6 kg)  Height: 6\' 2"  (1.88 m)    Body mass index is 24.52 kg/m. No results found.  General: well developed, well nourished, very pleasant elderly Caucasian male, seated, in no evident distress Head: head normocephalic and atraumatic.   Neck: supple with no carotid or supraclavicular bruits Cardiovascular: regular rate and rhythm, no murmurs Musculoskeletal: no deformity Skin:  no rash/petichiae Vascular:  Normal pulses all extremities   Neurologic Exam Mental Status: Awake and fully alert.  Fluent speech and language.  Oriented to place and time. Recent and remote memory intact. Attention span, concentration and fund of knowledge appropriate. Mood and affect appropriate.  Cranial Nerves: Pupils equal, briskly reactive to light. Extraocular movements full without nystagmus. Visual fields full to confrontation although decreased/blurred vision  left upper quadrant bilaterally. Hearing intact. Facial sensation  intact. Face, tongue, palate moves normally and symmetrically.  Motor: Normal bulk and tone. Normal strength in all tested extremity muscles Sensory.: intact to touch , pinprick , position and vibratory sensation.  Coordination: Rapid alternating movements normal in all extremities. Finger-to-nose and heel-to-shin performed accurately bilaterally. Gait and Station: Arises from chair without difficulty. Stance is normal. Gait demonstrates normal stride length and balance without use of assistive device. Tandem walk and heel toe with mild difficulty.  Reflexes: 1+ and symmetric. Toes downgoing.          ASSESSMENT: Greg Robertson is a 70 y.o. 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 Vascular risk factors include HTN, HLD, DM, AFib, history R CRAO and former tobacco use.      PLAN:  Right PCA stroke with hemorrhagic conversion:  Residual deficit: Visual impairment - left upper quad bilaterally.  Stable.  Monitored by ophthalmology Repeat CT head 10/27/2020 stable Continue Xarelto 20 mg daily and atorvastatin 40 mg for secondary stroke prevention Discussed secondary stroke prevention measures and importance of close PCP follow up for aggressive stroke risk factor management. I have gone over the pathophysiology of stroke, warning signs and symptoms, risk factors and their management in some detail with instructions to go to the closest emergency room for symptoms of concern. Atrial fibrillation: On Xarelto 20 mg daily for CHA2DS2-VASc score of at least 7.  Followed by Dr. Aundra Dubin.  History of Eliquis intolerance HTN: BP goal <130/90.  Stable on current regimen per facility HLD: LDL goal <70. Prior LDL 67 (11/2020) on atorvastatin 40 mg daily - currently non fasting. Request f/u with facility for repeat levels in the next couple of months DMII: A1c goal<7.0. prior A1c 9.6  (10/2020). Repeat today. Discussed with pt and sister to ensure facility is routinely monitoring diabetic regimen and A1c levels as this was requested by prior PCP Dr. Warrick Parisian    Follow up in 6 months or call earlier if needed      I spent 34 minutes of face-to-face and non-face-to-face time with patient and sister.  This included previsit chart review, lab review, study review, order entry, electronic health record documentation, patient and sister education and discussion regarding above diagnoses including education, interventions and answered all other questions to patient and sisters satisfaction  Frann Rider, [facility]  [facility] [address] [place], [place] [address]  Phone [phone] Fax [phone] Note: This document was prepared with digital dictation and possible smart phrase technology. Any transcriptional errors that result from this process are unintentional.

## 2021-03-29 NOTE — Patient Instructions (Addendum)
Continue Xarelto (rivaroxaban) daily  and atorvastatin for secondary stroke prevention  Continue to follow up with provider at [facility] for cholesterol, blood pressure and diabetes management - please ensure cholesterol levels and A1c routinely - will check A1c today. Unable to check cholesterol levels today as he is not fasting Maintain strict control of hypertension with blood pressure goal below 130/90, diabetes with hemoglobin A1c goal below 7.0 % and cholesterol with LDL cholesterol (bad cholesterol) goal below 70 mg/dL.   Signs of a Stroke? Follow the BEFAST method:  Balance Watch for a sudden loss of balance, trouble with coordination or vertigo Eyes Is there a sudden loss of vision in one or both eyes? Or double vision?  Face: Ask the person to smile. Does one side of the face droop or is it numb?  Arms: Ask the person to raise both arms. Does one arm drift downward? Is there weakness or numbness of a leg? Speech: Ask the person to repeat a simple phrase. Does the speech sound slurred/strange? Is the person confused ? Time: If you observe any of these signs, call 911.     Followup in the future with me in 6 months or call earlier if needed      Thank you for coming to see [place] at [facility]. I hope we have been able to provide you high quality care today.  You may receive a patient satisfaction survey over the next few weeks. We would appreciate your feedback and comments so that we may continue to improve ourselves and the health of our patients.

## 2021-03-30 ENCOUNTER — Telehealth: Payer: Self-pay

## 2021-03-30 LAB — HEMOGLOBIN A1C
Est. average glucose Bld gHb Est-mCnc: 189 mg/dL
Hgb A1c MFr Bld: 8.2 % — ABNORMAL HIGH (ref 4.8–5.6)

## 2021-03-30 NOTE — Telephone Encounter (Signed)
I contacted the pt and we discussed results.  Pt verbalized understanding and sts he will discuss with the providers at [facility] at this time. Pt reports he hopes to be going home soon and may request the referral to endocrinology at that time.  He also requested that I fax a copy of the results to the facility and I have done so.  Fax # is [phone] for [employer], confirmation received.

## 2021-03-30 NOTE — Telephone Encounter (Signed)
-----   Message from Frann Rider, NP sent at 03/30/2021  7:16 AM EST ----- Please call patient (cell phone #) letting him know his A1c has come down some but still showing uncontrolled diabetes currently at 8.2 with goal of less than 7. He needs to ensure facility providers are appropriately managing diabetes otherwise I can refer to endocrinology. Please let me know if he would like to do this. He did request leaving a detailed message if he does not answer. Thank you

## 2021-04-08 DIAGNOSIS — H401114 Primary open-angle glaucoma, right eye, indeterminate stage: Secondary | ICD-10-CM | POA: Diagnosis not present

## 2021-04-08 DIAGNOSIS — H2511 Age-related nuclear cataract, right eye: Secondary | ICD-10-CM | POA: Diagnosis not present

## 2021-04-19 DIAGNOSIS — H2512 Age-related nuclear cataract, left eye: Secondary | ICD-10-CM | POA: Diagnosis not present

## 2021-04-20 ENCOUNTER — Encounter (HOSPITAL_COMMUNITY): Payer: Self-pay | Admitting: Gastroenterology

## 2021-04-22 ENCOUNTER — Encounter (HOSPITAL_COMMUNITY): Payer: Medicare Other | Admitting: Cardiology

## 2021-04-22 DIAGNOSIS — H2512 Age-related nuclear cataract, left eye: Secondary | ICD-10-CM | POA: Diagnosis not present

## 2021-04-22 DIAGNOSIS — H401124 Primary open-angle glaucoma, left eye, indeterminate stage: Secondary | ICD-10-CM | POA: Diagnosis not present

## 2021-04-26 ENCOUNTER — Telehealth: Payer: Self-pay | Admitting: Adult Health

## 2021-04-26 DIAGNOSIS — I639 Cerebral infarction, unspecified: Secondary | ICD-10-CM | POA: Diagnosis not present

## 2021-04-26 NOTE — Telephone Encounter (Signed)
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

## 2021-04-26 NOTE — Telephone Encounter (Signed)
NP Delle Reining with [facility] returned called. Please call back.

## 2021-04-26 NOTE — Telephone Encounter (Signed)
NP Delle Reining with [facility] called has some questions about pt's condition. She is requesting a call back at [phone]

## 2021-04-26 NOTE — Telephone Encounter (Signed)
I called NP Thu back and we were able to discuss this message.  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 PCP has completed these forms last 2 years but in light of CVA  this past year wanted to see if from a neurology stand point the pt would be cleared to drive?  She did relay from a physical PCP stand point she felt like it was safe but wanted to check with neurology.

## 2021-04-26 NOTE — Telephone Encounter (Signed)
Checking with Janett Billow, NP on this ( see separate TE message from 04/26/2021).

## 2021-04-26 NOTE — Telephone Encounter (Addendum)
I called Delle Reining, NP. No answer, left a message asking for a call back.

## 2021-04-26 NOTE — Telephone Encounter (Signed)
Pt asking for a clearance to drive due to have had a stroke.  Every two years DMV request an evaluation to be able to drive. PCP said she will not sign off on it until neurologist signs off on the recommendation. Would like a call from the nurse.

## 2021-04-27 NOTE — Telephone Encounter (Signed)
I contacted NP Thu back and relayed message she verbalized understanding and appreciation for this message/information. She will reach out to the Ophthalmologist.

## 2021-04-29 ENCOUNTER — Ambulatory Visit (HOSPITAL_COMMUNITY)
Admission: RE | Admit: 2021-04-29 | Discharge: 2021-04-29 | Disposition: A | Discharge status: Home / Self Care | Payer: Medicare Other | Attending: Gastroenterology | Admitting: Gastroenterology

## 2021-04-29 ENCOUNTER — Other Ambulatory Visit: Payer: Self-pay

## 2021-04-29 ENCOUNTER — Ambulatory Visit (HOSPITAL_COMMUNITY): Payer: Medicare Other | Admitting: Registered Nurse

## 2021-04-29 ENCOUNTER — Encounter (HOSPITAL_COMMUNITY): Payer: Self-pay | Admitting: Gastroenterology

## 2021-04-29 ENCOUNTER — Encounter (HOSPITAL_COMMUNITY)
Admission: RE | Disposition: A | Discharge status: Home / Self Care | Payer: Self-pay | Source: Home / Self Care | Attending: Gastroenterology

## 2021-04-29 DIAGNOSIS — Z8673 Personal history of transient ischemic attack (TIA), and cerebral infarction without residual deficits: Secondary | ICD-10-CM | POA: Insufficient documentation

## 2021-04-29 DIAGNOSIS — I110 Hypertensive heart disease with heart failure: Secondary | ICD-10-CM | POA: Insufficient documentation

## 2021-04-29 DIAGNOSIS — K317 Polyp of stomach and duodenum: Secondary | ICD-10-CM | POA: Insufficient documentation

## 2021-04-29 DIAGNOSIS — I499 Cardiac arrhythmia, unspecified: Secondary | ICD-10-CM | POA: Diagnosis not present

## 2021-04-29 DIAGNOSIS — Z9049 Acquired absence of other specified parts of digestive tract: Secondary | ICD-10-CM | POA: Diagnosis not present

## 2021-04-29 DIAGNOSIS — J449 Chronic obstructive pulmonary disease, unspecified: Secondary | ICD-10-CM | POA: Diagnosis not present

## 2021-04-29 DIAGNOSIS — I509 Heart failure, unspecified: Secondary | ICD-10-CM | POA: Diagnosis not present

## 2021-04-29 DIAGNOSIS — Z1509 Genetic susceptibility to other malignant neoplasm: Secondary | ICD-10-CM | POA: Diagnosis not present

## 2021-04-29 DIAGNOSIS — Z08 Encounter for follow-up examination after completed treatment for malignant neoplasm: Secondary | ICD-10-CM | POA: Diagnosis not present

## 2021-04-29 DIAGNOSIS — N289 Disorder of kidney and ureter, unspecified: Secondary | ICD-10-CM | POA: Insufficient documentation

## 2021-04-29 DIAGNOSIS — Z85038 Personal history of other malignant neoplasm of large intestine: Secondary | ICD-10-CM | POA: Diagnosis not present

## 2021-04-29 DIAGNOSIS — Z980 Intestinal bypass and anastomosis status: Secondary | ICD-10-CM | POA: Diagnosis not present

## 2021-04-29 DIAGNOSIS — E109 Type 1 diabetes mellitus without complications: Secondary | ICD-10-CM | POA: Insufficient documentation

## 2021-04-29 DIAGNOSIS — D132 Benign neoplasm of duodenum: Secondary | ICD-10-CM | POA: Diagnosis not present

## 2021-04-29 HISTORY — PX: HEMOSTASIS CLIP PLACEMENT: SHX6857

## 2021-04-29 HISTORY — PX: ESOPHAGOGASTRODUODENOSCOPY (EGD) WITH PROPOFOL: SHX5813

## 2021-04-29 HISTORY — PX: POLYPECTOMY: SHX5525

## 2021-04-29 HISTORY — PX: COLONOSCOPY WITH PROPOFOL: SHX5780

## 2021-04-29 LAB — GLUCOSE, CAPILLARY: Glucose-Capillary: 132 mg/dL — ABNORMAL HIGH (ref 70–99)

## 2021-04-29 SURGERY — COLONOSCOPY WITH PROPOFOL
Anesthesia: Monitor Anesthesia Care

## 2021-04-29 MED ORDER — PROPOFOL 500 MG/50ML IV EMUL
INTRAVENOUS | Status: DC | PRN
  Administered 2021-04-29: 130 ug/kg/min via INTRAVENOUS

## 2021-04-29 MED ORDER — PROPOFOL 10 MG/ML IV BOLUS
INTRAVENOUS | Status: DC | PRN
  Administered 2021-04-29: 20 mg via INTRAVENOUS

## 2021-04-29 MED ORDER — PHENYLEPHRINE 40 MCG/ML (10ML) SYRINGE FOR IV PUSH (FOR BLOOD PRESSURE SUPPORT)
PREFILLED_SYRINGE | INTRAVENOUS | Status: DC | PRN
  Administered 2021-04-29 (×3): 80 ug via INTRAVENOUS

## 2021-04-29 MED ORDER — PHENYLEPHRINE HCL (PRESSORS) 10 MG/ML IV SOLN
INTRAVENOUS | Status: AC
  Filled 2021-04-29: qty 1

## 2021-04-29 MED ORDER — SODIUM CHLORIDE 0.9 % IV SOLN: INTRAVENOUS | Status: DC

## 2021-04-29 MED ORDER — LACTATED RINGERS IV SOLN: INTRAVENOUS | Status: DC

## 2021-04-29 MED ORDER — EPHEDRINE SULFATE-NACL 50-0.9 MG/10ML-% IV SOSY
PREFILLED_SYRINGE | INTRAVENOUS | Status: DC | PRN
  Administered 2021-04-29 (×4): 5 mg via INTRAVENOUS

## 2021-04-29 MED ORDER — LIDOCAINE 2% (20 MG/ML) 5 ML SYRINGE
INTRAMUSCULAR | Status: DC | PRN
  Administered 2021-04-29: 40 mg via INTRAVENOUS

## 2021-04-29 MED ORDER — LACTATED RINGERS IV SOLN: INTRAVENOUS | Status: DC | PRN

## 2021-04-29 MED ORDER — GLUCAGON HCL RDNA (DIAGNOSTIC) 1 MG IJ SOLR
INTRAMUSCULAR | Status: DC | PRN
  Administered 2021-04-29 (×2): .5 mg via INTRAVENOUS

## 2021-04-29 MED ORDER — PROPOFOL 10 MG/ML IV BOLUS
INTRAVENOUS | Status: AC
  Filled 2021-04-29: qty 20

## 2021-04-29 SURGICAL SUPPLY — 25 items

## 2021-04-29 NOTE — Anesthesia Postprocedure Evaluation (Signed)
Anesthesia Post Note  Patient: Greg Robertson  Procedure(s) Performed: COLONOSCOPY WITH PROPOFOL ESOPHAGOGASTRODUODENOSCOPY (EGD) WITH PROPOFOL POLYPECTOMY HEMOSTASIS CLIP PLACEMENT     Patient location during evaluation: PACU Anesthesia Type: MAC Level of consciousness: awake and alert Pain management: pain level controlled Vital Signs Assessment: post-procedure vital signs reviewed and stable Respiratory status: spontaneous breathing, nonlabored ventilation, respiratory function stable and patient connected to nasal cannula oxygen Cardiovascular status: stable and blood pressure returned to baseline Postop Assessment: no apparent nausea or vomiting Anesthetic complications: no   No notable events documented.  Last Vitals:  Vitals:   04/29/21 0850 04/29/21 0900  BP: (!) 129/49 (!) 119/39  Pulse: 70 70  Resp: 11 11  Temp:    SpO2: 100% 100%    Last Pain:  Vitals:   04/29/21 0900  TempSrc:   PainSc: 0-No pain                 Tiajuana Amass

## 2021-04-29 NOTE — Op Note (Signed)
[facility] Patient Name: Greg Robertson Procedure Date: 04/29/2021 MRN: [id number] Attending MD: Milus Banister , MD Date of Birth: 1951-04-12 CSN: [id number] Age: 71 Admit Type: Outpatient Procedure:                Upper GI endoscopy Indications:              Genetically proven Lynch Syndrome, personal history                            of CRC; EGD October 2019 nonspecific distal                            gastritis. 14 mm duodenal polyp noted and removed,                            site was tattooed with spot submucosal injection.                            This was a typical adenoma without high-grade                            dysplasia. He was recommended to have repeat upper                            endoscopy 3 months later. Providers:                Milus Banister, MD, Kary Kos RN, RN, Frazier Richards, Technician Referring MD:              Medicines:                Monitored Anesthesia Care Complications:            No immediate complications. Estimated blood loss:                            None. Estimated Blood Loss:     Estimated blood loss: none. Procedure:                Pre-Anesthesia Assessment:                           - Prior to the procedure, a History and Physical                            was performed, and patient medications and                            allergies were reviewed. The patient's tolerance of                            previous anesthesia was also reviewed. The risks                            and  benefits of the procedure and the sedation                            options and risks were discussed with the patient.                            All questions were answered, and informed consent                            was obtained. Prior Anticoagulants: The patient has                            taken Xarelto (rivaroxaban), last dose was 2 days                            prior to procedure. ASA  Grade Assessment: IV - A                            patient with severe systemic disease that is a                            constant threat to life. After reviewing the risks                            and benefits, the patient was deemed in                            satisfactory condition to undergo the procedure.                           After obtaining informed consent, the endoscope was                            passed under direct vision. Throughout the                            procedure, the patient's blood pressure, pulse, and                            oxygen saturations were monitored continuously. The                            GIF-H190 ([id number]) Olympus endoscope was introduced                            through the mouth, and advanced to the third part                            of duodenum. The upper GI endoscopy was                            accomplished without difficulty. The patient  tolerated the procedure well. Scope In: Scope Out: Findings:      A single 13 mm sessile polyp was found in the third portion of the       duodenum, adjacent to the 2019 tattoo site. I resected the polyp with       snare/cautery (endo cut Q setting) and then removed it with a [employer].       The polypectomy site slowly, persistently oozed blood and so I placed       three endo clips (Resolution 360s) at the site in good position which       resulted in immediate, complete cessation of bleeding.      The exam was otherwise without abnormality, including good views of the       region of the major papilla. Impression:               - A single 13 mm sessile polyp was found in the                            third portion of the duodenum, adjacent to the 2019                            tattoo site. I resected the polyp with                            snare/cautery (endo cut Q setting) and then removed                            it with a [employer]. The polypectomy  site slowly,                            persistently oozed blood and so I placed three endo                            clips (Resolution 360s) at the site in good                            position which resulted in immediate, complete                            cessation of bleeding.                           - The examination was otherwise normal. Moderate Sedation:      Not Applicable - Patient had care per Anesthesia. Recommendation:           - Patient has a contact number available for                            emergencies. The signs and symptoms of potential                            delayed complications were discussed with the  patient. Return to normal activities tomorrow.                            Written discharge instructions were provided to the                            patient.                           - Resume previous diet.                           - You can resume your xarelto tomorrow.                           - Await pathology results. Procedure Code(s):        --- Professional ---                           [id number], Esophagogastroduodenoscopy, flexible,                            transoral; with removal of tumor(s), polyp(s), or                            other lesion(s) by snare technique Diagnosis Code(s):        --- Professional ---                           K31.7, Polyp of stomach and duodenum                           Z15.09, Genetic susceptibility to other malignant                            neoplasm CPT copyright 2019 American Medical Association. All rights reserved. The codes documented in this report are preliminary and upon coder review may  be revised to meet current compliance requirements. Milus Banister, MD 04/29/2021 8:40:18 AM This report has been signed electronically. Number of Addenda: 0

## 2021-04-29 NOTE — Anesthesia Preprocedure Evaluation (Signed)
Anesthesia Evaluation  Patient identified by MRN, date of birth, ID band Patient awake    Reviewed: Allergy & Precautions, NPO status , Patient's Chart, lab work & pertinent test results  Airway Mallampati: II  TM Distance: >3 FB Neck ROM: Full    Dental  (+) Dental Advisory Given   Pulmonary COPD, former smoker,    breath sounds clear to auscultation       Cardiovascular hypertension, Pt. on medications +CHF  + dysrhythmias  Rhythm:Regular Rate:Normal     Neuro/Psych CVA    GI/Hepatic Neg liver ROS, Lynch syndrome   Endo/Other  diabetes  Renal/GU Renal disease     Musculoskeletal   Abdominal   Peds  Hematology negative hematology ROS (+)   Anesthesia Other Findings   Reproductive/Obstetrics                             Anesthesia Physical Anesthesia Plan  ASA: 3  Anesthesia Plan: MAC   Post-op Pain Management: Minimal or no pain anticipated   Induction:   PONV Risk Score and Plan: 1 and Propofol infusion and Treatment may vary due to age or medical condition  Airway Management Planned: Natural Airway and Nasal Cannula  Additional Equipment:   Intra-op Plan:   Post-operative Plan:   Informed Consent: I have reviewed the patients History and Physical, chart, labs and discussed the procedure including the risks, benefits and alternatives for the proposed anesthesia with the patient or authorized representative who has indicated his/her understanding and acceptance.       Plan Discussed with: CRNA  Anesthesia Plan Comments:         Anesthesia Quick Evaluation

## 2021-04-29 NOTE — Discharge Instructions (Signed)
YOU HAD AN ENDOSCOPIC PROCEDURE TODAY: Refer to the procedure report and other information in the discharge instructions given to you for any specific questions about what was found during the examination. If this information does not answer your questions, please call [place] office at [phone] to clarify.  ° °YOU SHOULD EXPECT: Some feelings of bloating in the abdomen. Passage of more gas than usual. Walking can help get rid of the air that was put into your GI tract during the procedure and reduce the bloating. If you had a lower endoscopy (such as a colonoscopy or flexible sigmoidoscopy) you may notice spotting of blood in your stool or on the toilet paper. Some abdominal soreness may be present for a day or two, also. ° °DIET: Your first meal following the procedure should be a light meal and then it is ok to progress to your normal diet. A half-sandwich or bowl of soup is an example of a good first meal. Heavy or fried foods are harder to digest and may make you feel nauseous or bloated. Drink plenty of fluids but you should avoid alcoholic beverages for 24 hours.  ° °ACTIVITY: Your care partner should take you home directly after the procedure. You should plan to take it easy, moving slowly for the rest of the day. You can resume normal activity the day after the procedure however YOU SHOULD NOT DRIVE, use power tools, machinery or perform tasks that involve climbing or major physical exertion for 24 hours (because of the sedation medicines used during the test).  ° °SYMPTOMS TO REPORT IMMEDIATELY: °A gastroenterologist can be reached at any hour. Please call [phone]  for any of the following symptoms:  °Following lower endoscopy (colonoscopy, flexible sigmoidoscopy) °Excessive amounts of blood in the stool  °Significant tenderness, worsening of abdominal pains  °Swelling of the abdomen that is new, acute  °Fever of 100° or higher  °Following upper endoscopy (EGD, EUS, ERCP, esophageal  dilation) °Vomiting of blood or coffee ground material  °New, significant abdominal pain  °New, significant chest pain or pain under the shoulder blades  °Painful or persistently difficult swallowing  °New shortness of breath  °Black, tarry-looking or red, bloody stools ° °FOLLOW UP:  °If any biopsies were taken you will be contacted by phone or by letter within the next 1-3 weeks. Call [phone]  if you have not heard about the biopsies in 3 weeks.  °Please also call with any specific questions about appointments or follow up tests.  °

## 2021-04-29 NOTE — Op Note (Signed)
[facility] Patient Name: Greg Robertson Procedure Date: 04/29/2021 MRN: [id number] Attending MD: Milus Banister , MD Date of Birth: [birth year] CSN: [id number] Age: 71 Admit Type: Outpatient Procedure:                Colonoscopy Indications:              High risk colon cancer surveillance: Personal                            history of colon cancer; T3N1Colon cancerdiagnosed                            during colonoscopy 2012Dr. Erskine Emery. Cancer                            in the cecum. He underwent a right colectomy                            February 2013, then adjuvant FOLFOX. Tumor                            pathology confirmed microsatellite unstable. Loss                            of expression of PMS 2. 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 He has Lynch syndrome.                            Colonoscopy February 2014 showed no polyps.                            Colonoscopy 08/2014 showed no polyps. Colonoscopy                            October 2019 normal right sided ileocolonic                            anastomosis. The examination was otherwise normal. Providers:                Milus Banister, MD, Kary Kos RN, RN, Frazier Richards, Technician Referring MD:              Medicines:                Monitored Anesthesia Care Complications:            No immediate complications. Estimated blood loss:                            None. Estimated Blood Loss:     Estimated blood loss: none. Procedure:                Pre-Anesthesia Assessment:                           -  Prior to the procedure, a History and Physical                            was performed, and patient medications and                            allergies were reviewed. The patient's tolerance of                            previous anesthesia was also reviewed. The risks                            and benefits of  the procedure and the sedation                            options and risks were discussed with the patient.                            All questions were answered, and informed consent                            was obtained. Prior Anticoagulants: The patient has                            taken xarelto, last dose was 2 days prior to                            procedure. ASA Grade Assessment: IV - A patient                            with severe systemic disease that is a constant                            threat to life. After reviewing the risks and                            benefits, the patient was deemed in satisfactory                            condition to undergo the procedure.                           After obtaining informed consent, the colonoscope                            was passed under direct vision. Throughout the                            procedure, the patient's blood pressure, pulse, and                            oxygen saturations were monitored continuously. The  CF-HQ190L ([id number] Olympus colonoscope was                            introduced through the anus and advanced to the the                            cecum, identified by appendiceal orifice and                            ileocecal valve. The colonoscopy was performed                            without difficulty. The patient tolerated the                            procedure well. The quality of the bowel                            preparation was good. The ileocecal valve,                            appendiceal orifice, and rectum were photographed. Scope In: 7:49:23 AM Scope Out: 8:01:27 AM Scope Withdrawal Time: 0 hours 6 minutes 16 seconds  Total Procedure Duration: 0 hours 12 minutes 4 seconds  Findings:      Normal right sided ileocolonic anastomosis.      Otherwise normal examination. Impression:               - Normal right sided ileocolonic anastomosis.                            - Otherwise normal examination. Moderate Sedation:      Not Applicable - Patient had care per Anesthesia. Recommendation:           - EGD now.                           - Repeat colonoscopy in 1 year for screening                            purposes. Procedure Code(s):        --- Professional ---                           [phone], Colonoscopy, flexible; diagnostic, including                            collection of specimen(s) by brushing or washing,                            when performed (separate procedure) Diagnosis Code(s):        --- Professional ---                           Q28.638, Personal history of other malignant  neoplasm of large intestine CPT copyright 2019 American Medical Association. All rights reserved. The codes documented in this report are preliminary and upon coder review may  be revised to meet current compliance requirements. Milus Banister, MD 04/29/2021 8:08:35 AM This report has been signed electronically. Number of Addenda: 0

## 2021-04-29 NOTE — Transfer of Care (Signed)
Immediate Anesthesia Transfer of Care Note  Patient: Peterson Mathey Duke  Procedure(s) Performed: COLONOSCOPY WITH PROPOFOL ESOPHAGOGASTRODUODENOSCOPY (EGD) WITH PROPOFOL POLYPECTOMY HEMOSTASIS CLIP PLACEMENT  Patient Location: PACU  Anesthesia Type:MAC  Level of Consciousness: drowsy  Airway & Oxygen Therapy: Patient Spontanous Breathing and Patient connected to face mask oxygen  Post-op Assessment: Report given to RN, Post -op Vital signs reviewed and stable and Patient moving all extremities X 4  Post vital signs: Reviewed and stable  Last Vitals:  Vitals Value Taken Time  BP 122/38   Temp    Pulse 71 04/29/21 0838  Resp 14 04/29/21 0838  SpO2 100 % 04/29/21 0838  Vitals shown include unvalidated device data.  Last Pain:  Vitals:   04/29/21 0718  TempSrc: Oral  PainSc: 0-No pain         Complications: No notable events documented.

## 2021-04-29 NOTE — H&P (Signed)
HPI: This is a man with lynch, h/o CRC   Review of pertinent gastrointestinal problems: 1.  T3N1Colon cancer diagnosed during colonoscopy 2012 Dr. Erskine Emery.  Cancer in the cecum.  He underwent a right colectomy February 2013, then adjuvant FOLFOX.  Tumor pathology confirmed microsatellite unstable.  Loss of expression of PMS 2.  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  He has Lynch syndrome.  Colonoscopy February 2014 showed no polyps.  Colonoscopy 08/2014 showed no polyps. EGD October 2019 nonspecific distal gastritis.  14 mm duodenal polyp noted and removed, site was tattooed with spot submucosal injection.  This was a typical adenoma without high-grade dysplasia.  He was recommended to have repeat upper endoscopy 3 months later.  Reminder letter was sent February 2020 Colonoscopy October 2019 normal right sided ileocolonic anastomosis.  The examination was otherwise normal.  He was recommended to have colonoscopy 1 year later.  Reminder letter was sent September 2020.   ROS: complete GI ROS as described in HPI, all other review negative.  Constitutional:  No unintentional weight loss   Past Medical History:  Diagnosis Date   Allergic rhinitis    Anemia    Atrial fibrillation ([place])    Persistent   Cancer of ascending colon, 7cm 05/25/2011   Congestive heart failure (CHF) (HCC)    Diabetes mellitus type I ([place])    Glaucoma    Hypertension    Pneumonia 1979 or 1980   Prostate nodule    Substance abuse [facility])     Past Surgical History:  Procedure Laterality Date   broken left shoulder blade and collar bone     FINGER SURGERY  2009   right middle   LAPAROSCOPIC ASSISTED 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     NASAL FRACTURE SURGERY  1968   PORTACATH PLACEMENT  07/01/2011   Procedure: INSERTION PORT-A-CATH;  Surgeon: Adin Hector, MD;  Location: [facility];  Service: General;  Laterality: Left;  Insertion of Port-A-Catheter Left Internal Jugular   RIGHT/LEFT  HEART CATH AND CORONARY ANGIOGRAPHY N/A 12/08/2020   Procedure: RIGHT/LEFT HEART CATH AND CORONARY ANGIOGRAPHY;  Surgeon: Larey Dresser, MD;  Location: [facility];  Service: Cardiovascular;  Laterality: N/A;   TONSILLECTOMY  1957 - approximate    No current facility-administered medications for this encounter.    Allergies as of 02/22/2021 - Review Complete 02/22/2021  Allergen Reaction Noted   Oxycodone Nausea And Vomiting 03/13/2012    Family History  Problem Relation Age of Onset   Colon polyps Father    Lung cancer Father    Diabetes Father    Prostate cancer Father    Breast cancer Mother    Pancreatitis Mother        intestinal adhesions   Insomnia Mother    Colon cancer Neg Hx    Rectal cancer Neg Hx     Social History   Socioeconomic History   Marital status: Single    Spouse name: Not on file   Number of children: 0   Years of education: 16   Highest education level: Bachelor's degree (e.g., BA, AB, BS)  Occupational History   Occupation: Retired    Comment: Press photographer  Tobacco Use   Smoking status: Former    Packs/day: 1.00    Years: 15.00    Pack years: 15.00    Types: Cigarettes    Quit date: 04/18/1993    Years since quitting: 28.0   Smokeless tobacco: Never   Tobacco comments:  marijuana every night   Vaping Use   Vaping Use: Never used  Substance and Sexual Activity   Alcohol use: Not Currently    Alcohol/week: 5.0 standard drinks    Types: 3 Cans of beer, 2 Shots of liquor per week    Comment: 1 drink every 2 days   Drug use: Not Currently    Types: Marijuana    Comment: once a night marijuana   Sexual activity: Not Currently  Other Topics Concern   Not on file  Social History Narrative   Lives alone.  Retired from [employer].    Social Determinants of Health   Financial Resource Strain: Low Risk    Difficulty of Paying Living Expenses: Not hard at all  Food Insecurity: No Food Insecurity   Worried About Sales executive in the Last Year: Never true   [place] in the Last Year: Never true  Transportation Needs: No Transportation Needs   Lack of Transportation (Medical): No   Lack of Transportation (Non-Medical): No  Physical Activity: Not on file  Stress: Not on file  Social Connections: Not on file  Intimate Partner Violence: Not on file     Physical Exam:  Constitutional: generally well-appearing Psychiatric: alert and oriented x3 Abdomen: soft, nontender, nondistended, no obvious ascites, no peritoneal signs, normal bowel sounds No peripheral edema noted in lower extremities  Assessment and plan: 71 y.o. male with h/o lynch, CRC  For colonoscopy and egd today  Please see the "Patient Instructions" section for addition details about the plan.  Owens Loffler, MD [facility] 04/29/2021, 7:24 AM

## 2021-04-30 ENCOUNTER — Encounter (HOSPITAL_COMMUNITY): Payer: Self-pay | Admitting: Gastroenterology

## 2021-04-30 LAB — SURGICAL PATHOLOGY

## 2021-05-03 DIAGNOSIS — I639 Cerebral infarction, unspecified: Secondary | ICD-10-CM | POA: Diagnosis not present

## 2021-05-03 DIAGNOSIS — E108 Type 1 diabetes mellitus with unspecified complications: Secondary | ICD-10-CM | POA: Diagnosis not present

## 2021-05-05 DIAGNOSIS — E782 Mixed hyperlipidemia: Secondary | ICD-10-CM | POA: Diagnosis not present

## 2021-05-05 DIAGNOSIS — I639 Cerebral infarction, unspecified: Secondary | ICD-10-CM | POA: Diagnosis not present

## 2021-05-05 DIAGNOSIS — I10 Essential (primary) hypertension: Secondary | ICD-10-CM | POA: Diagnosis not present

## 2021-05-13 ENCOUNTER — Other Ambulatory Visit (HOSPITAL_COMMUNITY): Payer: Self-pay | Admitting: Cardiology

## 2021-05-13 DIAGNOSIS — E559 Vitamin D deficiency, unspecified: Secondary | ICD-10-CM | POA: Diagnosis not present

## 2021-05-13 DIAGNOSIS — E119 Type 2 diabetes mellitus without complications: Secondary | ICD-10-CM | POA: Diagnosis not present

## 2021-05-13 DIAGNOSIS — E782 Mixed hyperlipidemia: Secondary | ICD-10-CM | POA: Diagnosis not present

## 2021-05-13 DIAGNOSIS — I10 Essential (primary) hypertension: Secondary | ICD-10-CM | POA: Diagnosis not present

## 2021-05-13 DIAGNOSIS — D519 Vitamin B12 deficiency anemia, unspecified: Secondary | ICD-10-CM | POA: Diagnosis not present

## 2021-05-13 DIAGNOSIS — E039 Hypothyroidism, unspecified: Secondary | ICD-10-CM | POA: Diagnosis not present

## 2021-05-17 DIAGNOSIS — I482 Chronic atrial fibrillation, unspecified: Secondary | ICD-10-CM | POA: Diagnosis not present

## 2021-05-17 DIAGNOSIS — J449 Chronic obstructive pulmonary disease, unspecified: Secondary | ICD-10-CM | POA: Diagnosis not present

## 2021-05-17 DIAGNOSIS — E559 Vitamin D deficiency, unspecified: Secondary | ICD-10-CM | POA: Diagnosis not present

## 2021-05-17 DIAGNOSIS — E108 Type 1 diabetes mellitus with unspecified complications: Secondary | ICD-10-CM | POA: Diagnosis not present

## 2021-05-19 ENCOUNTER — Other Ambulatory Visit (HOSPITAL_COMMUNITY): Payer: Self-pay

## 2021-05-19 ENCOUNTER — Encounter (HOSPITAL_COMMUNITY): Payer: Self-pay | Admitting: Cardiology

## 2021-05-19 ENCOUNTER — Ambulatory Visit (HOSPITAL_COMMUNITY)
Admission: RE | Admit: 2021-05-19 | Discharge: 2021-05-19 | Disposition: A | Discharge status: Home / Self Care | Payer: Medicare Other | Source: Ambulatory Visit | Attending: Cardiology | Admitting: Cardiology

## 2021-05-19 ENCOUNTER — Other Ambulatory Visit: Payer: Self-pay

## 2021-05-19 VITALS — BP 120/62 | HR 75 | Ht 74.0 in | Wt 195.0 lb

## 2021-05-19 DIAGNOSIS — E854 Organ-limited amyloidosis: Secondary | ICD-10-CM | POA: Diagnosis not present

## 2021-05-19 DIAGNOSIS — I5042 Chronic combined systolic (congestive) and diastolic (congestive) heart failure: Secondary | ICD-10-CM

## 2021-05-19 DIAGNOSIS — Z09 Encounter for follow-up examination after completed treatment for conditions other than malignant neoplasm: Secondary | ICD-10-CM | POA: Insufficient documentation

## 2021-05-19 DIAGNOSIS — I110 Hypertensive heart disease with heart failure: Secondary | ICD-10-CM | POA: Insufficient documentation

## 2021-05-19 DIAGNOSIS — Z79899 Other long term (current) drug therapy: Secondary | ICD-10-CM | POA: Diagnosis not present

## 2021-05-19 DIAGNOSIS — I480 Paroxysmal atrial fibrillation: Secondary | ICD-10-CM | POA: Diagnosis not present

## 2021-05-19 DIAGNOSIS — Z8673 Personal history of transient ischemic attack (TIA), and cerebral infarction without residual deficits: Secondary | ICD-10-CM | POA: Diagnosis not present

## 2021-05-19 DIAGNOSIS — I2510 Atherosclerotic heart disease of native coronary artery without angina pectoris: Secondary | ICD-10-CM | POA: Insufficient documentation

## 2021-05-19 DIAGNOSIS — I43 Cardiomyopathy in diseases classified elsewhere: Secondary | ICD-10-CM | POA: Diagnosis not present

## 2021-05-19 DIAGNOSIS — E1069 Type 1 diabetes mellitus with other specified complication: Secondary | ICD-10-CM | POA: Diagnosis not present

## 2021-05-19 DIAGNOSIS — Z87891 Personal history of nicotine dependence: Secondary | ICD-10-CM | POA: Insufficient documentation

## 2021-05-19 DIAGNOSIS — E785 Hyperlipidemia, unspecified: Secondary | ICD-10-CM | POA: Diagnosis not present

## 2021-05-19 DIAGNOSIS — Z7901 Long term (current) use of anticoagulants: Secondary | ICD-10-CM | POA: Insufficient documentation

## 2021-05-19 LAB — BASIC METABOLIC PANEL
Anion gap: 8 (ref 5–15)
BUN: 24 mg/dL — ABNORMAL HIGH (ref 8–23)
CO2: 25 mmol/L (ref 22–32)
Calcium: 9.1 mg/dL (ref 8.9–10.3)
Chloride: 107 mmol/L (ref 98–111)
Creatinine, Ser: 1.51 mg/dL — ABNORMAL HIGH (ref 0.61–1.24)
GFR, Estimated: 49 mL/min — ABNORMAL LOW (ref >60)
Glucose, Bld: 190 mg/dL — ABNORMAL HIGH (ref 70–99)
Potassium: 4.4 mmol/L (ref 3.5–5.1)
Sodium: 140 mmol/L (ref 135–145)

## 2021-05-19 LAB — LIPID PANEL
Cholesterol: 132 mg/dL (ref 0–200)
HDL: 52 mg/dL (ref >40)
LDL Cholesterol: 68 mg/dL (ref 0–99)
Total CHOL/HDL Ratio: 2.5 RATIO
Triglycerides: 58 mg/dL (ref <150)
VLDL: 12 mg/dL (ref 0–40)

## 2021-05-19 LAB — CBC
HCT: 37.7 % — ABNORMAL LOW (ref 39.0–52.0)
Hemoglobin: 12.8 g/dL — ABNORMAL LOW (ref 13.0–17.0)
MCH: 30.1 pg (ref 26.0–34.0)
MCHC: 34 g/dL (ref 30.0–36.0)
MCV: 88.7 fL (ref 80.0–100.0)
Platelets: 142 10*3/uL — ABNORMAL LOW (ref 150–400)
RBC: 4.25 MIL/uL (ref 4.22–5.81)
RDW: 12.9 % (ref 11.5–15.5)
WBC: 7.3 10*3/uL (ref 4.0–10.5)
nRBC: 0 % (ref 0.0–0.2)

## 2021-05-19 MED ORDER — ENTRESTO 49-51 MG PO TABS: 1.0000 | ORAL_TABLET | Freq: Two times a day (BID) | ORAL | 11 refills | Status: DC

## 2021-05-19 MED ORDER — LOKELMA 10 G PO PACK: 10.0000 g | PACK | Freq: Every day | ORAL | 11 refills | Status: DC

## 2021-05-19 NOTE — Progress Notes (Signed)
Patient ID: Greg Robertson, male   DOB: Jun 24, 1950, 71 y.o.   MRN: [id number] PCP: Dettinger, Fransisca Kaufmann, MD Cardiology: Dr. Aundra Dubin  71 y.o. with history of HTN, type I diabetes, and paroxysmal atrial fibrillation/CVA was referred by Dr. Shan Levans for evaluation of cardiomyopathy.  Patient also has had colon cancer with ileocolectomy in 2/13.  In 11/13, he fell and broke his clavicle (tripped).  Soon after this, he was admitted with DKA and profound dehydration/metabolic derangement.  He was noted to be in atrial fibrillation with RVR.  His cardiac enzymes were elevated, troponin peaked at 1.57. He initially went back into NSR but then had recurrent episodes of atrial fibrillation.  He was also started on Eliquis due to risk of embolic CVA, however he subsequently stopped Eliquis.   He was admitted in 6/22 with right PCA CVA and hemorrhagic conversion.  Initially just started on ASA, later this was stopped and Xarelto started.  Main residual from CVA is mild left upper visual field impairment.   CTA neck this admission showed < 50% bilateral carotid stenosis.  Echo showed EF 30-35%, diffuse hypokinesis.  RHC/LHC in 8/22 showed nonobstructive CAD and normal filling pressures.   Cardiac MRI in 11/22 showed LV EF 38%, RVEF 46%, basal inferolateral mid-wall LGE.   Patient returns for followup of CHF.  He is doing well symptomatically.  He denies exertional dyspnea or chest pain.  No lightheadedness.  No orthopnea/PND.  No cough. No palpitations. Not getting much exercise. He has not used any Lasix. He does note some ankle swelling. Weight is up.     ECG: NSR, nonspecific T wave flattening (personally reviewed)  REDS clip 29%  Labs (12/13): TSH normal, HCT 39.1, LFTs normal, K 4.2, creatinine 1.1 Labs (7/22): K 5.6, creatinine 1.17 Labs (8/22): BNP 283, LDL 67, HDL 58, K 5.3, creatinine 1.34 Labs (10/22): K 5.1, creatinine 1.24 Labs (11/22): urine immunofixation negative, K 4.9, creatinine  1.37  PMH: 1. HTN 2. Type I diabetes 3. Anemia, thrombocytopenia, leukopenia: related to chemotherapy.  4. Clavicular fracture 11/13 due to mechanical fall.  5. 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  FOLFOX chemotherapy 3/13 - 9/13.  7. Allergic rhinitis 8. Paroxysmal atrial fibrillation: First diagnosed in 11/13 during admission for DKA. Echo (11/13): EF 55% with grade I diastolic dysfunction.  9. Right central retinal artery occlusion 10. 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  - CTA neck with <50% carotid stenosis.  11. Cardiomyopathy: Nonischemic cardiomyopathy.  Echo in 6/22 with EF 30-35%, global hypokinesis, normal RV.   - RHC/LHC (8/22): 40% ostial/proximal LAD, D1 50%.  Mean RA 2, PA 29/9, mean PCWP 8, CI 2.94.  - Cardiac MRI (11/22): LV EF 38%, RVEF 46%, basal inferolateral mid-wall LGE.  SH: Currently living in SNF.  Quit smoking in 1995.  H/o marijuana.  Quit drinking ETOH in 2022.    FH: No atrial fibrillation or CAD.   ROS: All systems reviewed and negative except as per HPI.   Current Outpatient Medications  Medication Sig Dispense Refill   acetaminophen (TYLENOL) 500 MG tablet Take 1,000 mg by mouth every 6 (six) hours as needed for mild pain or headache. Pain     atorvastatin (LIPITOR) 40 MG tablet Take 1 tablet (40 mg total) by mouth daily. 30 tablet 3   carvedilol (COREG) 12.5 MG tablet Take 1 tablet (12.5 mg total) by mouth 2 (two) times daily. 60 tablet 11   Cholecalciferol (VITAMIN D3) 50 MCG (  2000 UT) TABS Take 2,000 Units by mouth in the morning. (0800)     Continuous Blood Gluc Receiver (FREESTYLE LIBRE 2 READER) DEVI USE IN THE MORNING, AT NOON, IN THE EVENING, AND AT BEDTIME AS DIRECTED Dx E10.9 1 each 1   Continuous Blood Gluc Sensor (FREESTYLE LIBRE 2 SENSOR) MISC 1 each by Does not apply route every 14 (fourteen) days 6 each 3   furosemide (LASIX) 20 MG tablet Take 1 tablet (20 mg total) by mouth daily as needed for  fluid or edema. 30 tablet 5   glucose blood test strip Use as instructed 100 each 12   insulin aspart (NOVOLOG) 100 UNIT/ML FlexPen Inject 9-11 Units into the skin See admin instructions. No insulin if BS < 100 or not eating  Inject 11 units subcutaneously with breakfast, Inject 11 units subcutaneously with lunch & inject 9 units subcutaneously with supper; Inject 5 units 4 times a day as needed for BS >450     insulin degludec (TRESIBA FLEXTOUCH) 100 UNIT/ML FlexTouch Pen Inject 25 Units into the skin every morning.     Insulin Pen Needle 32G X 4 MM MISC Use as directed 100 each 0   levocetirizine (XYZAL) 5 MG tablet Take 1 tablet (5 mg total) by mouth daily. 90 tablet 3   LOKELMA 10 g PACK packet Take 10 g by mouth daily. 30 packet 11   LOKELMA 5 g packet MIX PACKET INTO 3 TABLESPOONS (45ML) OF WATER. STIR WELL AND DRINK ONCE DAILY. **ENSURE THE ENTIRE DOSE HAS BEEN TAKEN** 30 each 0   Multiple Vitamin (MULTIVITAMIN WITH MINERALS) TABS tablet Take 1 tablet by mouth daily. (0800)     prednisoLONE acetate (PRED FORTE) 1 % ophthalmic suspension Place 1 drop into the right eye in the morning and at bedtime. Stop date 04/29/21     sacubitril-valsartan (ENTRESTO) 49-51 MG Take 1 tablet by mouth 2 (two) times daily. 60 tablet 11   spironolactone (ALDACTONE) 25 MG tablet Take 0.5 tablets (12.5 mg total) by mouth daily. 45 tablet 3   triamcinolone (NASACORT) 55 MCG/ACT AERO nasal inhaler INSTILL 1 SPRAY IN EACH NOSTRIL ONCE OR TWICE DAILY AS NEEDED. 16.5 Bottle 11   vitamin C (ASCORBIC ACID) 500 MG tablet Take 500 mg by mouth in the morning. (0800)     XARELTO 20 MG TABS tablet TAKE ONE TABLET BY MOUTH EVERY EVENING WITH SUPPER 30 tablet 11   No current facility-administered medications for this encounter.    BP 120/62    Pulse 75    Ht 6\' 2"  (1.88 m)    Wt 88.5 kg (195 lb)    SpO2 99%    BMI 25.04 kg/m  General: NAD Neck: No JVD, no thyromegaly or thyroid nodule.  Lungs: Clear to auscultation  bilaterally with normal respiratory effort. CV: Nondisplaced PMI.  Heart regular S1/S2, no S3/S4, no murmur.  1+ ankle edema.  No carotid bruit.  Normal pedal pulses.  Abdomen: Soft, nontender, no hepatosplenomegaly, no distention.  Skin: Intact without lesions or rashes.  Neurologic: Alert and oriented x 3.  Psych: Normal affect. Extremities: No clubbing or cyanosis.  HEENT: Normal.   Assessment/Plan: 1. Atrial fibrillation:  Paroxysmal. No recent symptoms.  He is in NSR today.  - Continue Xarelto 20 mg daily. CBC today.  - If atrial fibrillation recurs frequently, could use anti-arrhythmic versus ablation.  2. Chronic systolic CHF: Nonischemic cardiomyopathy.  Echo in 6/22 at time of CVA showed EF 30-35%, diffuse hypokinesis.  LHC/RHC in  8/22 showed nonobstructive CAD and normal filling pressures.  Cardiac MRI in 11/22 showed LV EF 38%, RVEF 46%, basal inferolateral mid-wall LGE.  Possible prior viral myocarditis based on MRI, cannot rule out cardiac amyloidosis.  On exam, he is not volume overloaded with NYHA class II symptoms.  - Continue Coreg 6.25 mg bid.  - Continue spironolactone 12.5 mg daily.  He is on Lokelma to control K.  BMET today, can increase Lokelma if needed.  - Increase Entresto to 49/51 bid, repeat BMET in 10 days.  - No SGLT2 inhibitor with type 1 diabetes.  - As amyloidosis is a consideration for MRI delayed enhancement pattern, I will arrange for PYP scan and myeloma panel to look for cardiac amyloidosis.  Urine immunofixation was negative.  - EF out of range for ICD on 11/22 cMRI.  Narrow QRS so not CRT candidate.  3. Hyperlipidemia: Continue atorvastatin, check lipids.  4. CAD: Nonobstructive on cath 8/22.  - Continue statin.  - No ASA given Xarelto use.   Followup 3 wks with HF pharmacist for medication titration, see me in 2 months.   Loralie Champagne 05/19/2021

## 2021-05-19 NOTE — Progress Notes (Signed)
ReDS Vest / Clip - 05/19/21 1000       ReDS Vest / Clip   Station Marker D    Ruler Value 31    ReDS Value Range Low volume    ReDS Actual Value 29

## 2021-05-19 NOTE — Patient Instructions (Addendum)
RedsClip done today.  EKG done today.   Labs done today. We will contact you only if your labs are abnormal.  INCREASE Entresto to 49-51mg  (1 tablet) by mouth 2 times daily. (Can take 2 of the 24-26mg  tablets by mouth 2 times daily until he runs out)   No other medication changes were made. Please continue all current medications as prescribed.  Your provider has requested that you have a PYP scan done.   Your physician recommends that you schedule a follow-up appointment in: 10 days for a lab only appointment, 3 weeks with our Clinic Pharmacist and in 2 months with Dr. Aundra Dubin  If you have any questions or concerns before your next appointment please send [place] a message through [facility] or call our office at [phone].    TO LEAVE A MESSAGE FOR THE NURSE SELECT OPTION 2, PLEASE LEAVE A MESSAGE INCLUDING: YOUR NAME DATE OF BIRTH CALL BACK NUMBER REASON FOR CALL**this is important as we prioritize the call backs  YOU WILL RECEIVE A CALL BACK THE SAME DAY AS LONG AS YOU CALL BEFORE 4:00 PM   Do the following things EVERYDAY: Weigh yourself in the morning before breakfast. Write it down and keep it in a log. Take your medicines as prescribed Eat low salt foods--Limit salt (sodium) to 2000 mg per day.  Stay as active as you can everyday Limit all fluids for the day to less than 2 liters   At the [facility], you and your health needs are our priority. As part of our continuing mission to provide you with exceptional heart care, we have created designated Provider Care Teams. These Care Teams include your primary Cardiologist (physician) and Advanced Practice Providers (APPs- Physician Assistants and Nurse Practitioners) who all work together to provide you with the care you need, when you need it.   You may see any of the following providers on your designated Care Team at your next follow up: Dr Glori Bickers Dr Haynes Kerns, NP Lyda Jester,  [place] Audry Riles, PharmD   Please be sure to bring in all your medications bottles to every appointment.

## 2021-05-24 LAB — MULTIPLE MYELOMA PANEL, SERUM
Albumin SerPl Elph-Mcnc: 3.7 g/dL (ref 2.9–4.4)
Albumin/Glob SerPl: 1.7 (ref 0.7–1.7)
Alpha 1: 0.2 g/dL (ref 0.0–0.4)
Alpha2 Glob SerPl Elph-Mcnc: 0.5 g/dL (ref 0.4–1.0)
B-Globulin SerPl Elph-Mcnc: 0.8 g/dL (ref 0.7–1.3)
Gamma Glob SerPl Elph-Mcnc: 0.7 g/dL (ref 0.4–1.8)
Globulin, Total: 2.2 g/dL (ref 2.2–3.9)
IgA: 132 mg/dL (ref 61–437)
IgG (Immunoglobin G), Serum: 750 mg/dL (ref 603–1613)
IgM (Immunoglobulin M), Srm: 38 mg/dL (ref 20–172)
Total Protein ELP: 5.9 g/dL — ABNORMAL LOW (ref 6.0–8.5)

## 2021-05-31 ENCOUNTER — Other Ambulatory Visit (HOSPITAL_COMMUNITY): Payer: Medicare Other

## 2021-05-31 DIAGNOSIS — U071 COVID-19: Secondary | ICD-10-CM | POA: Diagnosis not present

## 2021-05-31 DIAGNOSIS — E108 Type 1 diabetes mellitus with unspecified complications: Secondary | ICD-10-CM | POA: Diagnosis not present

## 2021-06-07 ENCOUNTER — Other Ambulatory Visit: Payer: Self-pay

## 2021-06-07 ENCOUNTER — Encounter (HOSPITAL_COMMUNITY)
Admission: RE | Admit: 2021-06-07 | Discharge: 2021-06-07 | Disposition: A | Discharge status: Home / Self Care | Payer: Medicare Other | Source: Ambulatory Visit | Attending: Cardiology | Admitting: Cardiology

## 2021-06-07 DIAGNOSIS — I639 Cerebral infarction, unspecified: Secondary | ICD-10-CM | POA: Diagnosis not present

## 2021-06-07 DIAGNOSIS — I5022 Chronic systolic (congestive) heart failure: Secondary | ICD-10-CM | POA: Diagnosis not present

## 2021-06-07 DIAGNOSIS — I10 Essential (primary) hypertension: Secondary | ICD-10-CM | POA: Diagnosis not present

## 2021-06-07 DIAGNOSIS — I509 Heart failure, unspecified: Secondary | ICD-10-CM | POA: Diagnosis not present

## 2021-06-07 IMAGING — NM NM SCAN TUMOR LOCALIZE WITH SPECT
4 series · 19 of 19 positions shown · non-contrast
Comparison: none

CLINICAL DATA: [DATE] beat ease, paroxysmal atrial fibrillation,
stroke, hypertension, CHF, question cardiac amyloidosis

EXAM:
NUCLEAR MEDICINE TUMOR LOCALIZATION. PYP CARDIAC AMYLOIDOSIS SCAN
WITH SPECT
TECHNIQUE: Following intravenous administration of radiopharmaceutical,
anterior planar images of the chest were obtained. Regions of
interest were placed on the heart and contralateral chest wall for
quantitative assessment. Additional SPECT imaging of the chest was
obtained.
RADIOPHARMACEUTICALS:  21.2 mCi TECHNETIUM 99 PYROPHOSPHATE

[Series 1: spect - (id)_(id)_cor · 4.1mm · 4.14mm/px · 6 of 128 frames shown]
[frame 11/128]
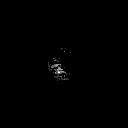
[frame 32/128]
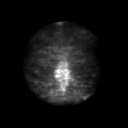
[frame 54/128]
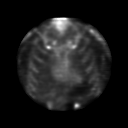
[frame 75/128]
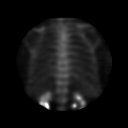
[frame 96/128]
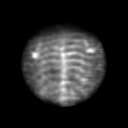
[frame 118/128]
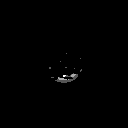

[Series 1: anterior · 4.14mm/px · 1 of 1 slices shown]
[im 1/1]
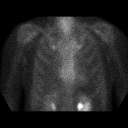

[Series 1: spect - (id)_(id)_tra · 4.1mm · 4.14mm/px · 6 of 128 frames shown]
[frame 11/128]
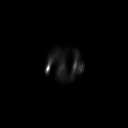
[frame 32/128]
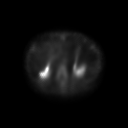
[frame 54/128]
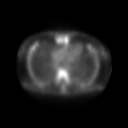
[frame 75/128]
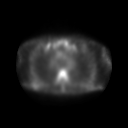
[frame 96/128]
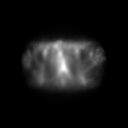
[frame 118/128]
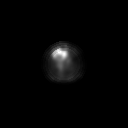

[Series 2: amyloid spect · 4.14mm/px · 6 of 64 frames shown]
[frame 6/64]
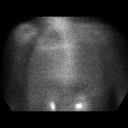
[frame 16/64]
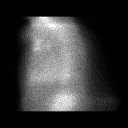
[frame 27/64]
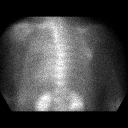
[frame 38/64]
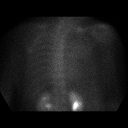
[frame 48/64]
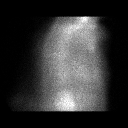
[frame 59/64]
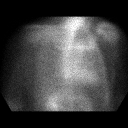

[19 of 19 positions shown; findings below may reference images not displayed]

FINDINGS: Planar Visual assessment:

Anterior planar imaging demonstrates radiotracer uptake within the
heart equal to uptake within the adjacent ribs (Grade 2).

Quantitative assessment :

Quantitative assessment of the cardiac uptake compared to the
contralateral chest wall is equal to 1.25 (H/CL = 1.25).

SPECT assessment: SPECT imaging of the chest demonstrates clear
radiotracer accumulation within the LEFT ventricle.
IMPRESSION: Visual and quantitative assessment (grade 2, H/CL = 1.25) are
strongly suggestive of transthyretin amyloidosis.

## 2021-06-07 MED ORDER — TECHNETIUM TC 99M PYROPHOSPHATE
21.2000 | Freq: Once | INTRAVENOUS | Status: AC | PRN
  Administered 2021-06-07: 21.2 via INTRAVENOUS
  Filled 2021-06-07: qty 22

## 2021-06-11 NOTE — Progress Notes (Incomplete)
***In Progress***    Advanced Heart Failure Clinic Note   PCP: Dettinger, Fransisca Kaufmann, MD Cardiology: Dr. Aundra Dubin HPI:  71 y.o. with history of HTN, type I diabetes, and paroxysmal atrial fibrillation/CVA was referred by Dr. Shan Levans for evaluation of cardiomyopathy.  Patient also has had colon cancer with ileocolectomy in 05/2011.  In 02/2012, he fell and broke his clavicle (tripped).  Soon after this, he was admitted with DKA and profound dehydration/metabolic derangement.  He was noted to be in atrial fibrillation with RVR.  His cardiac enzymes were elevated, troponin peaked at 1.57. He initially went back into NSR but then had recurrent episodes of atrial fibrillation.  He was also started on Eliquis due to risk of embolic CVA, however he subsequently stopped Eliquis.    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  Initially just started on ASA, later this was stopped and Xarelto started.  Main residual from CVA is mild left upper visual field impairment.   CTA neck this admission showed < 50% bilateral carotid stenosis.  Echo showed EF 30-35%, diffuse hypokinesis.  RHC/LHC in 11/2020 showed nonobstructive CAD and normal filling pressures.    Cardiac MRI in 02/2021 showed LV EF 38%, RVEF 46%, basal inferolateral mid-wall LGE.    Patient returns for followup of CHF on 05/19/2021.  He was doing well symptomatically, denied exertional dyspnea, chest pain, palpitations, cough,  lightheadedness, or orthopnea/PND. Not getting much exercise. He was not using any Lasix. He did have some ankle swelling. Weight was up at 195 lbs. REDS clip 29%   Today he returns to HF clinic for pharmacist medication titration. At last visit with MD Delene Loll was increased from 24/26 mg to 49/51 mg BID.   Overall feeling ***. Dizziness, lightheadedness, fatigue:  Chest pain or palpitations:  How is your breathing?: *** SOB: Able to complete all ADLs. Activity level ***  Weight at home pounds.  Takes furosemide/torsemide/bumex *** mg *** daily.  LEE PND/Orthopnea  Appetite *** Low-salt diet:   Physical Exam Cost/affordability of meds   HF Medications: Carvedilol 6.25 mg bid ?12.5 mg  Entresto 49/51 mg BID Spironolactone 12.5 mg daily Furosemide 20 mg PRN Lokelma 10g daily  Has the patient been experiencing any side effects to the medications prescribed?  {YES NO:22349}  Does the patient have any problems obtaining medications due to transportation or finances? Has Medicare Part D. Lokelma is ~$200/month. He was temporarily approved to receive Lokelma from [employer] but needs to apply and be denied Medicaid for full approval.   Understanding of regimen: {excellent/good/fair/poor:19665} Understanding of indications: {excellent/good/fair/poor:19665} Potential of compliance: {excellent/good/fair/poor:19665} Patient understands to avoid NSAIDs. Patient understands to avoid decongestants.    Pertinent Lab Values: Labs 05/19/2021: Serum creatinine 1.51, BUN 24, Potassium 4.4, Sodium 140  Vital Signs: Weight: *** (last clinic weight: 195 lbs) BL closer to 186 Blood pressure: *** 120/62 Heart rate: *** 75  Plan BMET today- didn't get 10 day s/p Entresto bmet Clarrify carvedilol dose A. Increase entresto to 97/103- BMET 10 days B. Increase spiro to 25- BMET 7 days C. Increase carvedilol - dose unclear  Assessment/Plan: 1. Chronic systolic CHF (EF ***), due to ***. NYHA class *** symptoms. 1. Atrial fibrillation:  Paroxysmal. No recent symptoms. - Continue Xarelto 20 mg daily. - If atrial fibrillation recurs frequently, could use anti-arrhythmic versus ablation.  2. Chronic systolic CHF: Nonischemic cardiomyopathy.  Echo in 09/2020 at time of CVA showed EF 30-35%, diffuse hypokinesis.  LHC/RHC in 11/2020 showed  nonobstructive CAD and normal filling pressures.  Cardiac MRI in 02/2021 showed LV EF 38%, RVEF 46%, basal inferolateral mid-wall LGE.  Possible prior viral  myocarditis based on MRI, cannot rule out cardiac amyloidosis.   On exam, he is not volume overloaded with NYHA class II symptoms.  - Continue Coreg 6.25 mg bid - Continue Entresto to 49/51 bid - Continue spironolactone 12.5 mg daily. On Lokelma to control K.  - No SGLT2 inhibitor with type 1 diabetes.  - As amyloidosis is a consideration for MRI delayed enhancement pattern, I will arrange for PYP scan and myeloma panel to look for cardiac amyloidosis.  Urine immunofixation was negative.  - EF out of range for ICD on 02/2021 cMRI.  Narrow QRS so not CRT candidate.  3. Hyperlipidemia: Continue atorvastatin, check lipids.  4. CAD: Nonobstructive on cath 11/2020.  - Continue statin.  - No ASA given Xarelto use.     Follow up Dr. Aundra Dubin in 1 month  Audry Riles, PharmD, BCPS, [facility], CPP Heart Failure Clinic Pharmacist [phone]

## 2021-06-14 ENCOUNTER — Other Ambulatory Visit: Payer: Self-pay

## 2021-06-14 ENCOUNTER — Ambulatory Visit (HOSPITAL_COMMUNITY)
Admission: RE | Admit: 2021-06-14 | Discharge: 2021-06-14 | Disposition: A | Discharge status: Home / Self Care | Payer: Medicare Other | Source: Ambulatory Visit | Attending: Cardiology | Admitting: Cardiology

## 2021-06-14 ENCOUNTER — Other Ambulatory Visit (HOSPITAL_COMMUNITY): Payer: Self-pay

## 2021-06-14 ENCOUNTER — Encounter (HOSPITAL_COMMUNITY): Payer: Self-pay

## 2021-06-14 ENCOUNTER — Telehealth (HOSPITAL_COMMUNITY): Payer: Self-pay | Admitting: Pharmacist

## 2021-06-14 VITALS — BP 160/75 | HR 66 | Wt 197.2 lb

## 2021-06-14 DIAGNOSIS — I5033 Acute on chronic diastolic (congestive) heart failure: Secondary | ICD-10-CM

## 2021-06-14 DIAGNOSIS — Z7901 Long term (current) use of anticoagulants: Secondary | ICD-10-CM | POA: Diagnosis not present

## 2021-06-14 DIAGNOSIS — Z8673 Personal history of transient ischemic attack (TIA), and cerebral infarction without residual deficits: Secondary | ICD-10-CM | POA: Diagnosis not present

## 2021-06-14 DIAGNOSIS — Z85038 Personal history of other malignant neoplasm of large intestine: Secondary | ICD-10-CM | POA: Diagnosis not present

## 2021-06-14 DIAGNOSIS — I5022 Chronic systolic (congestive) heart failure: Secondary | ICD-10-CM | POA: Insufficient documentation

## 2021-06-14 DIAGNOSIS — E782 Mixed hyperlipidemia: Secondary | ICD-10-CM | POA: Diagnosis not present

## 2021-06-14 DIAGNOSIS — Z8616 Personal history of COVID-19: Secondary | ICD-10-CM | POA: Diagnosis not present

## 2021-06-14 DIAGNOSIS — I428 Other cardiomyopathies: Secondary | ICD-10-CM | POA: Insufficient documentation

## 2021-06-14 DIAGNOSIS — Z79899 Other long term (current) drug therapy: Secondary | ICD-10-CM | POA: Insufficient documentation

## 2021-06-14 DIAGNOSIS — I2510 Atherosclerotic heart disease of native coronary artery without angina pectoris: Secondary | ICD-10-CM | POA: Insufficient documentation

## 2021-06-14 DIAGNOSIS — I110 Hypertensive heart disease with heart failure: Secondary | ICD-10-CM | POA: Insufficient documentation

## 2021-06-14 DIAGNOSIS — I480 Paroxysmal atrial fibrillation: Secondary | ICD-10-CM | POA: Insufficient documentation

## 2021-06-14 DIAGNOSIS — E109 Type 1 diabetes mellitus without complications: Secondary | ICD-10-CM | POA: Insufficient documentation

## 2021-06-14 DIAGNOSIS — J309 Allergic rhinitis, unspecified: Secondary | ICD-10-CM | POA: Diagnosis not present

## 2021-06-14 DIAGNOSIS — E785 Hyperlipidemia, unspecified: Secondary | ICD-10-CM | POA: Insufficient documentation

## 2021-06-14 DIAGNOSIS — I509 Heart failure, unspecified: Secondary | ICD-10-CM | POA: Diagnosis not present

## 2021-06-14 LAB — BASIC METABOLIC PANEL
Anion gap: 9 (ref 5–15)
BUN: 27 mg/dL — ABNORMAL HIGH (ref 8–23)
CO2: 25 mmol/L (ref 22–32)
Calcium: 8.8 mg/dL — ABNORMAL LOW (ref 8.9–10.3)
Chloride: 105 mmol/L (ref 98–111)
Creatinine, Ser: 1.33 mg/dL — ABNORMAL HIGH (ref 0.61–1.24)
GFR, Estimated: 58 mL/min — ABNORMAL LOW (ref >60)
Glucose, Bld: 164 mg/dL — ABNORMAL HIGH (ref 70–99)
Potassium: 4.1 mmol/L (ref 3.5–5.1)
Sodium: 139 mmol/L (ref 135–145)

## 2021-06-14 MED ORDER — LOKELMA 10 G PO PACK: 10.0000 g | PACK | Freq: Every day | ORAL | 3 refills | Status: DC

## 2021-06-14 MED ORDER — CARVEDILOL 12.5 MG PO TABS: 18.7500 mg | ORAL_TABLET | Freq: Two times a day (BID) | ORAL | 3 refills | Status: DC

## 2021-06-14 NOTE — Progress Notes (Signed)
Advanced Heart Failure Clinic Note   PCP: Dettinger, Fransisca Kaufmann, MD Cardiology: Dr. Aundra Dubin  HPI:  71 y.o. with history of HTN, type I diabetes, and paroxysmal atrial fibrillation/CVA was referred by Dr. Shan Levans for evaluation of cardiomyopathy.  Patient also has had colon cancer with ileocolectomy in 05/2011.  In 02/2012, he fell and broke his clavicle (tripped).  Soon after this, he was admitted with DKA and profound dehydration/metabolic derangement.  He was noted to be in atrial fibrillation with RVR.  His cardiac enzymes were elevated, troponin peaked at 1.57. He initially went back into NSR but then had recurrent episodes of atrial fibrillation.  He was also started on Eliquis due to risk of embolic CVA, however he subsequently stopped Eliquis.    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  Initially just started on ASA, later this was stopped and Xarelto started.  Main residual from CVA is mild left upper visual field impairment.  CTA neck this admission showed < 50% bilateral carotid stenosis.  Echo showed EF 30-35%, diffuse hypokinesis.  RHC/LHC in 11/2020 showed nonobstructive CAD and normal filling pressures.    Cardiac MRI in 02/2021 showed LV EF 38%, RVEF 46%, basal inferolateral mid-wall LGE.    He returned for followup of CHF on 05/19/2021.  He was doing well symptomatically, denied exertional dyspnea, chest pain, palpitations, cough,  lightheadedness, or orthopnea/PND. Not getting much exercise. He was not using any Lasix. He did have some ankle swelling. Weight was up at 195 lbs. REDS clip 29%.   Today he returns to HF clinic for pharmacist medication titration. At last visit with MD, Delene Loll was increased from 24/26 mg to 49/51 mg BID. Overall he is feeling good today. He has recently recovered from [place] since he was last seen. Denies dizziness, lightheadedness, fatigue, chest pain or palpitations. He states he had a slight cough when he had COVID,  but he never had SOB and his breathing is unchanged. BP today was significantly higher than his normal baseline, 160/75. He is able to complete all ADLs and walks. He has started going to the exercise room again since cataract surgery to use the bike and hand weights. His weight at home is typically in the low 190s lbs. He takes furosemide 20 mg PRN, but has not needed it in 4-5 months. No LEE, PND or orthopnea. His appetite is good. He gets meals cooked for him at [facility], so he is not sure the salt content in the food, but he avoids adding salt to his food.   HF Medications: Carvedilol 12.5 mg BID Entresto 49/51 mg BID Spironolactone 12.5 mg daily Furosemide 20 mg PRN Lokelma 10 g daily   Has the patient been experiencing any side effects to the medications prescribed? No  Does the patient have any problems obtaining medications due to transportation or finances? Has Medicare Part D. Lokelma is ~$200/month. He is fully approved for 2023 via [employer].  PAN grant approved 06/14/2021 for [employer].  Understanding of regimen: fair Understanding of indications: fair Potential of compliance: good Patient understands to avoid NSAIDs. Patient understands to avoid decongestants.    Pertinent Lab Values: Labs 05/19/2021: Serum creatinine 1.51, BUN 24, Potassium 4.4, Sodium 140 Labs 06/14/2021: BMET pending, genetic testing for amyloid pending  Vital Signs: Weight: 197.2 lbs (last clinic weight: 195 lbs) Blood pressure: 160/75 mHg Heart rate: 66 bpm  Assessment/Plan: 1. Atrial fibrillation:  Paroxysmal. No recent symptoms. - Continue Xarelto 20  mg daily. - If atrial fibrillation recurs frequently, could use anti-arrhythmic versus ablation.  2. Chronic systolic CHF: Nonischemic cardiomyopathy.  Echo in 09/2020 at time of CVA showed EF 30-35%, diffuse hypokinesis.  LHC/RHC in 11/2020 showed nonobstructive CAD and normal filling pressures.  Cardiac MRI in 02/2021 showed LV EF 38%, RVEF 46%, basal  inferolateral mid-wall LGE.  Possible prior viral myocarditis based on MRI, cannot rule out cardiac amyloidosis.   On exam, he is not volume overloaded with NYHA class II symptoms.  - Increase carvedilol to 18.75 mg BID - Continue Entresto 49/51 mg BID - Continue spironolactone 12.5 mg daily. On Lokelma 10 g daily to control K.  - No SGLT2 inhibitor with type 1 diabetes.  - PYP scan and cardiac MRI equivocal for TTR amyloidosis per Dr. Aundra Dubin. Plan to repeat tests in 4-5 months. Genetic testing for amyloid sent today.  - EF out of range for ICD on 02/2021 cMRI.  Narrow QRS so not CRT candidate.  3. Hyperlipidemia: Continue pravastatin  4. CAD: Nonobstructive on cath 11/2020.  - Continue statin.  - No ASA given Xarelto use.   Follow up with Dr. Aundra Dubin in 1 month  Audry Riles, PharmD, BCPS, [facility], CPP Heart Failure Clinic Pharmacist [phone]

## 2021-06-14 NOTE — Progress Notes (Signed)
Blood collected for TTR genetic testing per Dr McLean.  Order form completed and both shipped by Fed[employer] to [facility].  

## 2021-06-14 NOTE — Patient Instructions (Signed)
It was a pleasure seeing you today!  MEDICATIONS: -We are changing your medications today -Increase carvedilol 18.75 mg (1 and 1/2 tablets) daily -Call if you have questions about your medications.  LABS: -We will call you if your labs need attention.  NEXT APPOINTMENT: Return to clinic in 1 month with Dr. Aundra Dubin.  In general, to take care of your heart failure: -Limit your fluid intake to 2 Liters (half-gallon) per day.   -Limit your salt intake to ideally 2-3 grams (2000-3000 mg) per day. -Weigh yourself daily and record, and bring that "weight diary" to your next appointment.  (Weight gain of 2-3 pounds in 1 day typically means fluid weight.) -The medications for your heart are to help your heart and help you live longer.   -Please contact us before stopping any of your heart medications.  Call the clinic at [phone] with questions or to reschedule future appointments.

## 2021-06-14 NOTE — Telephone Encounter (Signed)
Was successful in securing patient an $85 grant from [employer] [facility]) to provide copayment coverage for [employer].  This will keep the out of pocket expense at $0.     I have spoken with the patient.    The billing information is as follows and has been shared with the pharmacy.   Member ID: [id number] Group ID: [id number] RxBin ID: [id number] PCN: [id number] Eligibility Start Date: 03/16/2021 Eligibility End Date: 06/13/2022 Assistance Amount: $1,200.00  Fund:  Heart Failure   Audry Riles, PharmD, BCPS, BCCP, CPP Heart Failure Clinic Pharmacist [phone]

## 2021-06-18 DIAGNOSIS — F331 Major depressive disorder, recurrent, moderate: Secondary | ICD-10-CM | POA: Diagnosis not present

## 2021-07-02 DIAGNOSIS — F5101 Primary insomnia: Secondary | ICD-10-CM | POA: Diagnosis not present

## 2021-07-12 DIAGNOSIS — I482 Chronic atrial fibrillation, unspecified: Secondary | ICD-10-CM | POA: Diagnosis not present

## 2021-07-12 DIAGNOSIS — I509 Heart failure, unspecified: Secondary | ICD-10-CM | POA: Diagnosis not present

## 2021-07-12 DIAGNOSIS — E108 Type 1 diabetes mellitus with unspecified complications: Secondary | ICD-10-CM | POA: Diagnosis not present

## 2021-07-12 DIAGNOSIS — J449 Chronic obstructive pulmonary disease, unspecified: Secondary | ICD-10-CM | POA: Diagnosis not present

## 2021-07-16 DIAGNOSIS — F5101 Primary insomnia: Secondary | ICD-10-CM | POA: Diagnosis not present

## 2021-07-20 ENCOUNTER — Encounter (HOSPITAL_COMMUNITY): Payer: Self-pay | Admitting: Cardiology

## 2021-07-20 ENCOUNTER — Ambulatory Visit (HOSPITAL_COMMUNITY)
Admission: RE | Admit: 2021-07-20 | Discharge: 2021-07-20 | Disposition: A | Discharge status: Home / Self Care | Payer: Medicare Other | Source: Ambulatory Visit | Attending: Cardiology | Admitting: Cardiology

## 2021-07-20 VITALS — BP 126/70 | HR 76 | Wt 201.6 lb

## 2021-07-20 DIAGNOSIS — I5042 Chronic combined systolic (congestive) and diastolic (congestive) heart failure: Secondary | ICD-10-CM | POA: Diagnosis not present

## 2021-07-20 DIAGNOSIS — Z794 Long term (current) use of insulin: Secondary | ICD-10-CM | POA: Diagnosis not present

## 2021-07-20 DIAGNOSIS — Z8616 Personal history of COVID-19: Secondary | ICD-10-CM | POA: Diagnosis not present

## 2021-07-20 DIAGNOSIS — E785 Hyperlipidemia, unspecified: Secondary | ICD-10-CM | POA: Insufficient documentation

## 2021-07-20 DIAGNOSIS — Z87891 Personal history of nicotine dependence: Secondary | ICD-10-CM | POA: Insufficient documentation

## 2021-07-20 DIAGNOSIS — I428 Other cardiomyopathies: Secondary | ICD-10-CM | POA: Insufficient documentation

## 2021-07-20 DIAGNOSIS — I2510 Atherosclerotic heart disease of native coronary artery without angina pectoris: Secondary | ICD-10-CM | POA: Insufficient documentation

## 2021-07-20 DIAGNOSIS — I480 Paroxysmal atrial fibrillation: Secondary | ICD-10-CM | POA: Insufficient documentation

## 2021-07-20 DIAGNOSIS — Z7901 Long term (current) use of anticoagulants: Secondary | ICD-10-CM | POA: Insufficient documentation

## 2021-07-20 DIAGNOSIS — I5022 Chronic systolic (congestive) heart failure: Secondary | ICD-10-CM | POA: Insufficient documentation

## 2021-07-20 DIAGNOSIS — I110 Hypertensive heart disease with heart failure: Secondary | ICD-10-CM | POA: Insufficient documentation

## 2021-07-20 DIAGNOSIS — Z8673 Personal history of transient ischemic attack (TIA), and cerebral infarction without residual deficits: Secondary | ICD-10-CM | POA: Diagnosis not present

## 2021-07-20 DIAGNOSIS — Z85038 Personal history of other malignant neoplasm of large intestine: Secondary | ICD-10-CM | POA: Diagnosis not present

## 2021-07-20 DIAGNOSIS — Z79899 Other long term (current) drug therapy: Secondary | ICD-10-CM | POA: Insufficient documentation

## 2021-07-20 DIAGNOSIS — E109 Type 1 diabetes mellitus without complications: Secondary | ICD-10-CM | POA: Diagnosis not present

## 2021-07-20 LAB — BASIC METABOLIC PANEL
Anion gap: 5 (ref 5–15)
BUN: 32 mg/dL — ABNORMAL HIGH (ref 8–23)
CO2: 26 mmol/L (ref 22–32)
Calcium: 9.1 mg/dL (ref 8.9–10.3)
Chloride: 110 mmol/L (ref 98–111)
Creatinine, Ser: 1.46 mg/dL — ABNORMAL HIGH (ref 0.61–1.24)
GFR, Estimated: 51 mL/min — ABNORMAL LOW (ref >60)
Glucose, Bld: 51 mg/dL — ABNORMAL LOW (ref 70–99)
Potassium: 4.3 mmol/L (ref 3.5–5.1)
Sodium: 141 mmol/L (ref 135–145)

## 2021-07-20 LAB — BRAIN NATRIURETIC PEPTIDE: B Natriuretic Peptide: 102.6 pg/mL — ABNORMAL HIGH (ref 0.0–100.0)

## 2021-07-20 MED ORDER — FUROSEMIDE 40 MG PO TABS: 40.0000 mg | ORAL_TABLET | Freq: Every day | ORAL | 11 refills | Status: DC

## 2021-07-20 MED ORDER — ENTRESTO 97-103 MG PO TABS: 1.0000 | ORAL_TABLET | Freq: Two times a day (BID) | ORAL | 3 refills | Status: DC

## 2021-07-20 NOTE — Progress Notes (Signed)
Blood collected for TTR genetic testing per Dr McLean.  Order form completed and both shipped by Fed[employer] to [facility].  

## 2021-07-20 NOTE — Patient Instructions (Addendum)
Medication Changes: ? ?Increase Entresto to 97/103 Twice daily ? ?Keep taking Lasix 40 mg daily ? ?Lab Work: ? ?Labs done today, your results will be available in MyChart, we will contact you for abnormal readings. ? ? ?Testing/Procedures: ? ?Repeat blood work in 10 days ? ?Genetic test has been done, this has to be sent to [place] to be processed and can take 1-2 weeks to get results back.  We will let you know the results. ? ?Referrals: ? ?none ? ?Special Instructions // Education: ? ?Wear compression stockings ? ?Follow-Up in: 1 month  ? ?At the [facility], you and your health needs are our priority. We have a designated team specialized in the treatment of Heart Failure. This Care Team includes your primary Heart Failure Specialized Cardiologist (physician), Advanced Practice Providers (APPs- Physician Assistants and Nurse Practitioners), and Pharmacist who all work together to provide you with the care you need, when you need it.  ? ?You may see any of the following providers on your designated Care Team at your next follow up: ? ?Dr Glori Bickers ?Dr Loralie Champagne ?Darrick Grinder, NP ?Lyda Jester, PA ?Jessica Milford,NP ?Marlyce Huge, PA ?Audry Riles, PharmD ? ? ?Please be sure to bring in all your medications bottles to every appointment.  ? ?Need to Contact us: ? ?If you have any questions or concerns before your next appointment please send [place] a message through [facility] or call our office at [phone].   ? ?TO LEAVE A MESSAGE FOR THE NURSE SELECT OPTION 2, PLEASE LEAVE A MESSAGE INCLUDING: ?YOUR NAME ?DATE OF BIRTH ?CALL BACK NUMBER ?REASON FOR CALL**this is important as we prioritize the call backs ? ?YOU WILL RECEIVE A CALL BACK THE SAME DAY AS LONG AS YOU CALL BEFORE 4:00 PM ? ? ?

## 2021-07-21 NOTE — Progress Notes (Signed)
Patient ID: Greg Robertson, male   DOB: 1950/12/19, 71 y.o.   MRN: [id number] ?PCP: Dettinger, Fransisca Kaufmann, MD ?Cardiology: Dr. Aundra Dubin ? ?71 y.o. with history of HTN, type I diabetes, and paroxysmal atrial fibrillation/CVA was referred by Dr. Shan Levans for evaluation of cardiomyopathy.  Patient also has had colon cancer with ileocolectomy in 2/13.  In 11/13, he fell and broke his clavicle (tripped).  Soon after this, he was admitted with DKA and profound dehydration/metabolic derangement.  He was noted to be in atrial fibrillation with RVR.  His cardiac enzymes were elevated, troponin peaked at 1.57. He initially went back into NSR but then had recurrent episodes of atrial fibrillation.  He was also started on Eliquis due to risk of embolic CVA, however he subsequently stopped Eliquis.  ? ?He was admitted in 6/22 with right PCA CVA and hemorrhagic conversion.  Initially just started on ASA, later this was stopped and Xarelto started.  Main residual from CVA is mild left upper visual field impairment.   CTA neck this admission showed < 50% bilateral carotid stenosis.  Echo showed EF 30-35%, diffuse hypokinesis.  RHC/LHC in 8/22 showed nonobstructive CAD and normal filling pressures.  ? ?Cardiac MRI in 11/22 showed LV EF 38%, RVEF 46%, basal inferolateral mid-wall LGE. Diffuse LGE images.  ECV 37-46%.  Based on cMRI, differential included prior myocarditis and cardiac amyloidosis.  PYP scan was done in 2/23, showing grade 1-2, H/CL 1.25.  The PYP scan was equivocal.  ? ?Patient returns for followup of CHF.  He reports more abdominal and lower extremity swelling.  He has been taking 40 mg Lasix for the last few days due to the edema.  Weight is up 6 lbs. No chest pain.  No orthopnea/PND. He denies significant exertional dyspnea.  No lightheadedness. He did have COVID-19 in 2/23.    ? ?ECG: NSR, PACs, baseline artifact (personally reviewed) ? ?Labs (12/13): TSH normal, HCT 39.1, LFTs normal, K 4.2, creatinine 1.1 ?Labs  (7/22): K 5.6, creatinine 1.17 ?Labs (8/22): BNP 283, LDL 67, HDL 58, K 5.3, creatinine 1.34 ?Labs (10/22): K 5.1, creatinine 1.24 ?Labs (11/22): urine immunofixation negative, K 4.9, creatinine 1.37 ?Labs (2/23): K 4.1, creatinine 1.33, myeloma panel negative, LDL 68, TGs 58, hgb 12.8 ? ?PMH: ?1. HTN ?2. Type I diabetes ?3. Anemia, thrombocytopenia, leukopenia: related to chemotherapy.  ?4. Clavicular fracture 11/13 due to mechanical fall.  ?5. Glaucoma ?6. Colon cancer: [facility].  Ileocolectomy 2/13.  FOLFOX chemotherapy 3/13 - 9/13.  ?7. Allergic rhinitis ?8. Paroxysmal atrial fibrillation: First diagnosed in 11/13 during admission for DKA. Echo (11/13): EF 55% with grade I diastolic dysfunction.  ?9. Right central retinal artery occlusion ?10. 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  ?- CTA neck with <50% carotid stenosis.  ?11. Cardiomyopathy: Nonischemic cardiomyopathy.  Echo in 6/22 with EF 30-35%, global hypokinesis, normal RV.   ?- RHC/LHC (8/22): 40% ostial/proximal LAD, D1 50%.  Mean RA 2, PA 29/9, mean PCWP 8, CI 2.94.  ?- Cardiac MRI (11/22): LV EF 38%, RVEF 46%, basal inferolateral mid-wall LGE. ?- PYP (2/23) showing grade 1-2, H/CL 1.25.  The PYP scan was equivocal.  ?12. COVID-19 2/23 ? ?SH: Currently living in SNF.  Quit smoking in 1995.  H/o marijuana.  Quit drinking ETOH in 2022.   ? ?FH: No atrial fibrillation or CAD.  ? ?ROS: All systems reviewed and negative except as per HPI.  ? ?Current Outpatient Medications  ?Medication Sig Dispense Refill  ? acetaminophen (  TYLENOL) 500 MG tablet Take 1,000 mg by mouth every 6 (six) hours as needed for mild pain or headache. Pain    ? atorvastatin (LIPITOR) 40 MG tablet Take 1 tablet (40 mg total) by mouth daily. 30 tablet 3  ? carvedilol (COREG) 12.5 MG tablet Take 1.5 tablets (18.75 mg total) by mouth 2 (two) times daily with a meal. 270 tablet 3  ? Cholecalciferol 50 MCG (2000 UT) TABS Take 2 tablets by mouth daily.    ? Continuous  Blood Gluc Sensor (FREESTYLE LIBRE 2 SENSOR) MISC 1 each by Does not apply route every 14 (fourteen) days 6 each 3  ? glucose blood test strip Use as instructed 100 each 12  ? insulin aspart (NOVOLOG) 100 UNIT/ML injection Inject 15 Units into the skin 2 (two) times daily. 11 units with dinner    ? insulin degludec (TRESIBA FLEXTOUCH) 100 UNIT/ML FlexTouch Pen Inject 25 Units into the skin every morning.    ? Insulin Pen Needle 32G X 4 MM MISC Use as directed 100 each 0  ? levocetirizine (XYZAL) 5 MG tablet Take 1 tablet (5 mg total) by mouth daily. 90 tablet 3  ? Multiple Vitamin (MULTIVITAMIN WITH MINERALS) TABS tablet Take 1 tablet by mouth daily. (0800)    ? sodium zirconium cyclosilicate (LOKELMA) 10 g PACK packet Take 10 g by mouth daily. 90 packet 3  ? spironolactone (ALDACTONE) 25 MG tablet Take 0.5 tablets (12.5 mg total) by mouth daily. 45 tablet 3  ? triamcinolone (NASACORT) 55 MCG/ACT AERO nasal inhaler INSTILL 1 SPRAY IN EACH NOSTRIL ONCE OR TWICE DAILY AS NEEDED. 16.5 Bottle 11  ? vitamin C (ASCORBIC ACID) 500 MG tablet Take 500 mg by mouth in the morning. (0800)    ? XARELTO 20 MG TABS tablet TAKE ONE TABLET BY MOUTH EVERY EVENING WITH SUPPER 30 tablet 11  ? furosemide (LASIX) 40 MG tablet Take 1 tablet (40 mg total) by mouth daily. 30 tablet 11  ? sacubitril-valsartan (ENTRESTO) 97-103 MG Take 1 tablet by mouth 2 (two) times daily. 180 tablet 3  ? ?No current facility-administered medications for this encounter.  ? ? ?BP 126/70   Pulse 76   Wt 91.4 kg (201 lb 9.6 oz)   SpO2 97%   BMI 25.88 kg/m?  ?General: NAD ?Neck: JVP 8 cm, no thyromegaly or thyroid nodule.  ?Lungs: Clear to auscultation bilaterally with normal respiratory effort. ?CV: Nondisplaced PMI.  Heart regular S1/S2, no S3/S4, no murmur.  1+ edema 3/4 to knees bilaterally.  No carotid bruit.  Normal pedal pulses.  ?Abdomen: Soft, nontender, no hepatosplenomegaly, mild abdominal distention.  ?Skin: Intact without lesions or rashes.   ?Neurologic: Alert and oriented x 3.  ?Psych: Normal affect. ?Extremities: No clubbing or cyanosis.  ?HEENT: Normal.  ? ?Assessment/Plan: ?1. Atrial fibrillation:  Paroxysmal. No recent symptoms.  He is in NSR today.  ?- Continue Xarelto 20 mg daily.   ?- If atrial fibrillation recurs frequently, could use anti-arrhythmic versus ablation.  ?2. Chronic systolic CHF: Nonischemic cardiomyopathy.  Echo in 6/22 at time of CVA showed EF 30-35%, diffuse hypokinesis.  LHC/RHC in 8/22 showed nonobstructive CAD and normal filling pressures.  Cardiac MRI in 11/22 showed LV EF 38%, RVEF 46%, basal inferolateral mid-wall LGE.  Possible prior viral myocarditis based on MRI, cannot rule out cardiac amyloidosis.  PYP scan was equivocal, myeloma panel was negative.  NYHA class II symptoms (stable) but he does look volume overloaded on exam.  ?- Continue Coreg 18.75  mg bid.  ?- Continue spironolactone 12.5 mg daily.  He is on Lokelma to control K.  BMET today.  ?- Increase Entresto to 97/103 bid, repeat BMET in 10 days.  ?- No SGLT2 inhibitor with type 1 diabetes.  ?- I will keep him on the higher dose of Lasix, 40 mg daily, for now.  ?- As amyloidosis is a consideration for MRI delayed enhancement pattern and PYP scan was equivocal, I will send genetic testing for TTR mutations and will repeat PYP scan in 6 months.  ?- EF out of range for ICD on 11/22 cMRI.  Narrow QRS so not CRT candidate.  ?- Wear compression stockings with significant lower extremity edema.  ?3. Hyperlipidemia: Continue atorvastatin, good lipids in 2/23.  ?4. CAD: Nonobstructive on cath 8/22.  ?- Continue statin.  ?- No ASA given Xarelto use.  ? ?Followup 1 month with APP.  ? ?Greg Robertson ?07/21/2021 ? ? ?

## 2021-08-03 ENCOUNTER — Ambulatory Visit (HOSPITAL_COMMUNITY)
Admission: RE | Admit: 2021-08-03 | Discharge: 2021-08-03 | Disposition: A | Discharge status: Home / Self Care | Payer: Medicare Other | Source: Ambulatory Visit | Attending: Internal Medicine | Admitting: Internal Medicine

## 2021-08-03 DIAGNOSIS — I5042 Chronic combined systolic (congestive) and diastolic (congestive) heart failure: Secondary | ICD-10-CM | POA: Insufficient documentation

## 2021-08-03 LAB — BASIC METABOLIC PANEL
Anion gap: 8 (ref 5–15)
BUN: 27 mg/dL — ABNORMAL HIGH (ref 8–23)
CO2: 27 mmol/L (ref 22–32)
Calcium: 9.2 mg/dL (ref 8.9–10.3)
Chloride: 106 mmol/L (ref 98–111)
Creatinine, Ser: 1.57 mg/dL — ABNORMAL HIGH (ref 0.61–1.24)
GFR, Estimated: 47 mL/min — ABNORMAL LOW (ref >60)
Glucose, Bld: 116 mg/dL — ABNORMAL HIGH (ref 70–99)
Potassium: 4.7 mmol/L (ref 3.5–5.1)
Sodium: 141 mmol/L (ref 135–145)

## 2021-08-09 DIAGNOSIS — R600 Localized edema: Secondary | ICD-10-CM | POA: Diagnosis not present

## 2021-08-09 DIAGNOSIS — E782 Mixed hyperlipidemia: Secondary | ICD-10-CM | POA: Diagnosis not present

## 2021-08-09 DIAGNOSIS — J309 Allergic rhinitis, unspecified: Secondary | ICD-10-CM | POA: Diagnosis not present

## 2021-08-09 DIAGNOSIS — E108 Type 1 diabetes mellitus with unspecified complications: Secondary | ICD-10-CM | POA: Diagnosis not present

## 2021-08-09 DIAGNOSIS — I10 Essential (primary) hypertension: Secondary | ICD-10-CM | POA: Diagnosis not present

## 2021-08-13 DIAGNOSIS — E1065 Type 1 diabetes mellitus with hyperglycemia: Secondary | ICD-10-CM | POA: Diagnosis not present

## 2021-08-13 DIAGNOSIS — F331 Major depressive disorder, recurrent, moderate: Secondary | ICD-10-CM | POA: Diagnosis not present

## 2021-08-13 DIAGNOSIS — F5101 Primary insomnia: Secondary | ICD-10-CM | POA: Diagnosis not present

## 2021-08-16 DIAGNOSIS — E1065 Type 1 diabetes mellitus with hyperglycemia: Secondary | ICD-10-CM | POA: Diagnosis not present

## 2021-08-30 NOTE — Progress Notes (Signed)
Patient ID: Greg Robertson, male   DOB: 11-Nov-1950, 71 y.o.   MRN: [id number] ?PCP: Dettinger, Fransisca Kaufmann, MD ?Cardiology: Dr. Aundra Dubin ? ?71 y.o. with history of HTN, type I diabetes, and paroxysmal atrial fibrillation/CVA was referred by Dr. Shan Levans for evaluation of cardiomyopathy.  Patient also has had colon cancer with ileocolectomy in 2/13.  In 11/13, he fell and broke his clavicle (tripped).  Soon after this, he was admitted with DKA and profound dehydration/metabolic derangement.  He was noted to be in atrial fibrillation with RVR.  His cardiac enzymes were elevated, troponin peaked at 1.57. He initially went back into NSR but then had recurrent episodes of atrial fibrillation.  He was also started on Eliquis due to risk of embolic CVA, however he subsequently stopped Eliquis.  ? ?He was admitted in 6/22 with right PCA CVA and hemorrhagic conversion.  Initially just started on ASA, later this was stopped and Xarelto started.  Main residual from CVA is mild left upper visual field impairment.   CTA neck this admission showed < 50% bilateral carotid stenosis.  Echo showed EF 30-35%, diffuse hypokinesis.  RHC/LHC in 8/22 showed nonobstructive CAD and normal filling pressures.  ? ?Cardiac MRI in 11/22 showed LV EF 38%, RVEF 46%, basal inferolateral mid-wall LGE. Diffuse LGE images.  ECV 37-46%.  Based on cMRI, differential included prior myocarditis and cardiac amyloidosis.  PYP scan was done in 2/23, showing grade 1-2, H/CL 1.25.  The PYP scan was equivocal.  ? ?Follow up 4/23, volume overloaded with more abdominal swelling. Entresto increased to goal dose. ? ?Today he returns for HF follow up with his wife. He remains at [facility]. 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 Gets around assisted living facility well using his cane. Wearing compression hose, but still notices swelling in ankles. Continues with abdominal fullness and endorses constipation. Denies  palpitations,  CP, dizziness, or PND/Orthopnea. Appetite ok. No fever or chills. Weight at home 194-195 pounds. Taking all medications.  ? ?REDs: 31% ?  ?ECG: none ordered today. ? ?Labs (12/13): TSH normal, HCT 39.1, LFTs normal, K 4.2, creatinine 1.1 ?Labs (7/22): K 5.6, creatinine 1.17 ?Labs (8/22): BNP 283, LDL 67, HDL 58, K 5.3, creatinine 1.34 ?Labs (10/22): K 5.1, creatinine 1.24 ?Labs (11/22): urine immunofixation negative, K 4.9, creatinine 1.37 ?Labs (2/23): K 4.1, creatinine 1.33, myeloma panel negative, LDL 68, TGs 58, hgb 12.8 ?Labs (4/23): K 4.7, creatinine 1.57 ? ?PMH: ?1. HTN ?2. Type I diabetes ?3. Anemia, thrombocytopenia, leukopenia: related to chemotherapy.  ?4. Clavicular fracture 11/13 due to mechanical fall.  ?5. Glaucoma ?6. Colon cancer: [facility].  Ileocolectomy 2/13.  FOLFOX chemotherapy 3/13 - 9/13.  ?7. Allergic rhinitis ?8. Paroxysmal atrial fibrillation: First diagnosed in 11/13 during admission for DKA. Echo (11/13): EF 55% with grade I diastolic dysfunction.  ?9. Right central retinal artery occlusion ?10. 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  ?- CTA neck with <50% carotid stenosis.  ?11. Cardiomyopathy: Nonischemic cardiomyopathy.  Echo in 6/22 with EF 30-35%, global hypokinesis, normal RV.   ?- RHC/LHC (8/22): 40% ostial/proximal LAD, D1 50%.  Mean RA 2, PA 29/9, mean PCWP 8, CI 2.94.  ?- Cardiac MRI (11/22): LV EF 38%, RVEF 46%, basal inferolateral mid-wall LGE. ?- PYP (2/23) showing grade 1-2, H/CL 1.25.  The PYP scan was equivocal.  ?12. COVID-19 2/23 ? ?SH: Currently living in SNF.  Quit smoking in 1995.  H/o marijuana.  Quit drinking ETOH in 2022.   ? ?FH: No atrial fibrillation or CAD.  ? ?ROS: All systems reviewed and negative except as per HPI.  ? ?Current Outpatient Medications  ?Medication Sig Dispense Refill  ? atorvastatin (LIPITOR) 40 MG tablet Take 1 tablet (40 mg total) by mouth daily. 30 tablet 3  ? carvedilol (COREG) 12.5 MG tablet Take 1.5  tablets (18.75 mg total) by mouth 2 (two) times daily with a meal. 270 tablet 3  ? furosemide (LASIX) 20 MG tablet Take 20 mg by mouth daily.    ? Multiple Vitamin (MULTIVITAMIN WITH MINERALS) TABS tablet Take 1 tablet by mouth daily. (0800)    ? sacubitril-valsartan (ENTRESTO) 97-103 MG Take 1 tablet by mouth 2 (two) times daily. 180 tablet 3  ? spironolactone (ALDACTONE) 25 MG tablet Take 0.5 tablets (12.5 mg total) by mouth daily. 45 tablet 3  ? acetaminophen (TYLENOL) 500 MG tablet Take 1,000 mg by mouth every 6 (six) hours as needed for mild pain or headache. Pain    ? Cholecalciferol 50 MCG (2000 UT) TABS Take 2 tablets by mouth daily.    ? Continuous Blood Gluc Sensor (FREESTYLE LIBRE 2 SENSOR) MISC 1 each by Does not apply route every 14 (fourteen) days 6 each 3  ? glucose blood test strip Use as instructed 100 each 12  ? insulin aspart (NOVOLOG) 100 UNIT/ML injection Inject 15 Units into the skin 2 (two) times daily. 11 units with dinner    ? insulin degludec (TRESIBA FLEXTOUCH) 100 UNIT/ML FlexTouch Pen Inject 25 Units into the skin every morning.    ? Insulin Pen Needle 32G X 4 MM MISC Use as directed 100 each 0  ? levocetirizine (XYZAL) 5 MG tablet Take 1 tablet (5 mg total) by mouth daily. 90 tablet 3  ? sodium zirconium cyclosilicate (LOKELMA) 10 g PACK packet Take 10 g by mouth daily. 90 packet 3  ? triamcinolone (NASACORT) 55 MCG/ACT AERO nasal inhaler INSTILL 1 SPRAY IN EACH NOSTRIL ONCE OR TWICE DAILY AS NEEDED. 16.5 Bottle 11  ? vitamin C (ASCORBIC ACID) 500 MG tablet Take 500 mg by mouth in the morning. (0800)    ? XARELTO 20 MG TABS tablet TAKE ONE TABLET BY MOUTH EVERY EVENING WITH SUPPER 30 tablet 11  ? ?No current facility-administered medications for this encounter.  ? ?Wt Readings from Last 3 Encounters:  ?08/31/21 92.4 kg (203 lb 12.8 oz)  ?07/20/21 91.4 kg (201 lb 9.6 oz)  ?06/14/21 89.4 kg (197 lb 3.2 oz)  ? ?BP (!) 100/56   Pulse 74   Wt 92.4 kg (203 lb 12.8 oz)   SpO2 97%   BMI  26.17 kg/m?  ?General:  NAD. No resp difficulty ?HEENT: Normal ?Neck: Supple. No JVD. Carotids 2+ bilat; no bruits. No lymphadenopathy or thryomegaly appreciated. ?Cor: PMI nondisplaced. Regular rate & rhythm. No rubs, gallops or murmurs. ?Lungs: Clear ?Abdomen: Soft, nontender, nondistended. No hepatosplenomegaly. No bruits or masses. Good bowel sounds. ?Extremities: No cyanosis, clubbing, rash, 1+ pedal edema; compression hose on. ?Neuro: Alert & oriented x 3, cranial nerves grossly intact. Moves all 4 extremities w/o difficulty. Affect pleasant. ? ?Assessment/Plan: ?1. Atrial fibrillation:  Paroxysmal. No recent symptoms.  He is regular on exam today.  ?- Continue Xarelto 20 mg daily.   ?- If atrial fibrillation recurs frequently, could use anti-arrhythmic versus ablation.  ?2. Chronic systolic CHF: Nonischemic cardiomyopathy.  Echo in 6/22 at time of CVA showed EF 30-35%, diffuse hypokinesis.  LHC/RHC in 8/22  showed nonobstructive CAD and normal filling pressures.  Cardiac MRI in 11/22 showed LV EF 38%, RVEF 46%, basal inferolateral mid-wall LGE.  Possible prior viral myocarditis based on MRI, cannot rule out cardiac amyloidosis.  PYP scan was equivocal, myeloma panel was negative.  NYHA class II symptoms (stable), he does not look markedly volume overloaded today, mild pedal edema. ReDs 31%.  ?- Continue Lasix 40 mg q am/20 mg q pm. BMET today. ?- Continue compression hose. ?- Continue Coreg 18.75 mg bid.  ?- Continue spironolactone 12.5 mg daily.  He is on Lokelma to control K.  Follow BMET. ?- Continue Entresto 97/103 mg bid. ?- No SGLT2 inhibitor with type 1 diabetes.  ?- As amyloidosis is a consideration for MRI delayed enhancement pattern and PYP scan was equivocal, genetic testing for TTR mutations has been sent and will repeat PYP scan in 6 months.  ?- EF out of range for ICD on 11/22 cMRI.  Narrow QRS on recent ECG, so not CRT candidate.  ?3. Hyperlipidemia: Continue atorvastatin, good lipids in 2/23.   ?4. CAD: Nonobstructive on cath 8/22.  ?- Continue statin.  ?- No ASA given Xarelto use.  ? ?Follow up in 3-4 months with Dr. Aundra Dubin. ? ?Rafael Bihari FNP-BC ?08/31/2021 ? ?

## 2021-08-31 ENCOUNTER — Ambulatory Visit (HOSPITAL_COMMUNITY)
Admission: RE | Admit: 2021-08-31 | Discharge: 2021-08-31 | Disposition: A | Discharge status: Home / Self Care | Payer: Medicare Other | Source: Ambulatory Visit | Attending: Family Medicine | Admitting: Family Medicine

## 2021-08-31 ENCOUNTER — Encounter (HOSPITAL_COMMUNITY): Payer: Self-pay

## 2021-08-31 VITALS — BP 100/56 | HR 74 | Wt 203.8 lb

## 2021-08-31 DIAGNOSIS — Z794 Long term (current) use of insulin: Secondary | ICD-10-CM | POA: Insufficient documentation

## 2021-08-31 DIAGNOSIS — I110 Hypertensive heart disease with heart failure: Secondary | ICD-10-CM | POA: Diagnosis not present

## 2021-08-31 DIAGNOSIS — Z79899 Other long term (current) drug therapy: Secondary | ICD-10-CM | POA: Diagnosis not present

## 2021-08-31 DIAGNOSIS — E785 Hyperlipidemia, unspecified: Secondary | ICD-10-CM | POA: Insufficient documentation

## 2021-08-31 DIAGNOSIS — Z8673 Personal history of transient ischemic attack (TIA), and cerebral infarction without residual deficits: Secondary | ICD-10-CM | POA: Insufficient documentation

## 2021-08-31 DIAGNOSIS — E109 Type 1 diabetes mellitus without complications: Secondary | ICD-10-CM | POA: Diagnosis not present

## 2021-08-31 DIAGNOSIS — I5042 Chronic combined systolic (congestive) and diastolic (congestive) heart failure: Secondary | ICD-10-CM | POA: Diagnosis not present

## 2021-08-31 DIAGNOSIS — I2510 Atherosclerotic heart disease of native coronary artery without angina pectoris: Secondary | ICD-10-CM | POA: Diagnosis not present

## 2021-08-31 DIAGNOSIS — Z9221 Personal history of antineoplastic chemotherapy: Secondary | ICD-10-CM | POA: Insufficient documentation

## 2021-08-31 DIAGNOSIS — I480 Paroxysmal atrial fibrillation: Secondary | ICD-10-CM | POA: Diagnosis not present

## 2021-08-31 DIAGNOSIS — I428 Other cardiomyopathies: Secondary | ICD-10-CM | POA: Diagnosis not present

## 2021-08-31 DIAGNOSIS — Z85038 Personal history of other malignant neoplasm of large intestine: Secondary | ICD-10-CM | POA: Diagnosis not present

## 2021-08-31 DIAGNOSIS — E1069 Type 1 diabetes mellitus with other specified complication: Secondary | ICD-10-CM

## 2021-08-31 DIAGNOSIS — I5022 Chronic systolic (congestive) heart failure: Secondary | ICD-10-CM | POA: Diagnosis not present

## 2021-08-31 DIAGNOSIS — Z7901 Long term (current) use of anticoagulants: Secondary | ICD-10-CM | POA: Diagnosis not present

## 2021-08-31 LAB — BASIC METABOLIC PANEL
Anion gap: 9 (ref 5–15)
BUN: 28 mg/dL — ABNORMAL HIGH (ref 8–23)
CO2: 27 mmol/L (ref 22–32)
Calcium: 9.3 mg/dL (ref 8.9–10.3)
Chloride: 104 mmol/L (ref 98–111)
Creatinine, Ser: 1.73 mg/dL — ABNORMAL HIGH (ref 0.61–1.24)
GFR, Estimated: 42 mL/min — ABNORMAL LOW (ref >60)
Glucose, Bld: 177 mg/dL — ABNORMAL HIGH (ref 70–99)
Potassium: 4.8 mmol/L (ref 3.5–5.1)
Sodium: 140 mmol/L (ref 135–145)

## 2021-08-31 LAB — BRAIN NATRIURETIC PEPTIDE: B Natriuretic Peptide: 128.1 pg/mL — ABNORMAL HIGH (ref 0.0–100.0)

## 2021-08-31 NOTE — Progress Notes (Signed)
ReDS Vest / Clip - 08/31/21 1400   ? ?  ? ReDS Vest / Clip  ? Station Marker C   ? Ruler Value 28.5   ? ReDS Value Range Low volume   ? ReDS Actual Value 31   ? ?  ?  ? ?  ? ? ?

## 2021-08-31 NOTE — Patient Instructions (Signed)
Thank you for coming in today ? ?Labs were done today, if any labs are abnormal the clinic will call you ?No news is good news ? ?Your physician recommends that you schedule a follow-up appointment in:  ?3-4 months with Dr. Aundra Dubin ? ?At the [facility], you and your health needs are our priority. As part of our continuing mission to provide you with exceptional heart care, we have created designated Provider Care Teams. These Care Teams include your primary Cardiologist (physician) and Advanced Practice Providers (APPs- Physician Assistants and Nurse Practitioners) who all work together to provide you with the care you need, when you need it.  ? ?You may see any of the following providers on your designated Care Team at your next follow up: ?Dr Glori Bickers ?Dr Loralie Champagne ?Darrick Grinder, NP ?Lyda Jester, PA ?Jessica Milford,NP ?Marlyce Huge, PA ?Audry Riles, PharmD ? ? ?Please be sure to bring in all your medications bottles to every appointment.  ? ?If you have any questions or concerns before your next appointment please send [place] a message through [place] or call our office at [phone].   ? ?TO LEAVE A MESSAGE FOR THE NURSE SELECT OPTION 2, PLEASE LEAVE A MESSAGE INCLUDING: ?YOUR NAME ?DATE OF BIRTH ?CALL BACK NUMBER ?REASON FOR CALL**this is important as we prioritize the call backs ? ?YOU WILL RECEIVE A CALL BACK THE SAME DAY AS LONG AS YOU CALL BEFORE 4:00 PM ? ?

## 2021-09-01 ENCOUNTER — Encounter (HOSPITAL_COMMUNITY): Payer: Self-pay

## 2021-09-03 DIAGNOSIS — F5101 Primary insomnia: Secondary | ICD-10-CM | POA: Diagnosis not present

## 2021-09-06 DIAGNOSIS — E559 Vitamin D deficiency, unspecified: Secondary | ICD-10-CM | POA: Diagnosis not present

## 2021-09-06 DIAGNOSIS — J449 Chronic obstructive pulmonary disease, unspecified: Secondary | ICD-10-CM | POA: Diagnosis not present

## 2021-09-06 DIAGNOSIS — I482 Chronic atrial fibrillation, unspecified: Secondary | ICD-10-CM | POA: Diagnosis not present

## 2021-09-09 DIAGNOSIS — I10 Essential (primary) hypertension: Secondary | ICD-10-CM | POA: Diagnosis not present

## 2021-09-09 DIAGNOSIS — E119 Type 2 diabetes mellitus without complications: Secondary | ICD-10-CM | POA: Diagnosis not present

## 2021-09-16 DIAGNOSIS — I509 Heart failure, unspecified: Secondary | ICD-10-CM | POA: Diagnosis not present

## 2021-09-17 DIAGNOSIS — F331 Major depressive disorder, recurrent, moderate: Secondary | ICD-10-CM | POA: Diagnosis not present

## 2021-09-24 DIAGNOSIS — F5101 Primary insomnia: Secondary | ICD-10-CM | POA: Diagnosis not present

## 2021-09-29 ENCOUNTER — Ambulatory Visit (INDEPENDENT_AMBULATORY_CARE_PROVIDER_SITE_OTHER): Payer: Medicare Other | Admitting: Adult Health

## 2021-09-29 ENCOUNTER — Telehealth: Payer: Self-pay | Admitting: Adult Health

## 2021-09-29 ENCOUNTER — Encounter: Payer: Self-pay | Admitting: Adult Health

## 2021-09-29 VITALS — BP 106/52 | HR 73 | Ht 74.0 in | Wt 204.0 lb

## 2021-09-29 DIAGNOSIS — Z9189 Other specified personal risk factors, not elsewhere classified: Secondary | ICD-10-CM | POA: Diagnosis not present

## 2021-09-29 DIAGNOSIS — I480 Paroxysmal atrial fibrillation: Secondary | ICD-10-CM

## 2021-09-29 DIAGNOSIS — I63431 Cerebral infarction due to embolism of right posterior cerebral artery: Secondary | ICD-10-CM | POA: Diagnosis not present

## 2021-09-29 DIAGNOSIS — G4700 Insomnia, unspecified: Secondary | ICD-10-CM

## 2021-09-29 NOTE — Progress Notes (Signed)
[facility] [address] [place]. [place] [address] [phone]       STROKE FOLLOW UP NOTE  [name] Date of Birth:  26-May-1950 Medical Record Number:  [id number]   Reason for Referral: stroke follow up    SUBJECTIVE:   CHIEF COMPLAINT:  Chief Complaint  Patient presents with   Follow-up    Rm 3 with sister Greg Robertson  Pt is well and stable, no new concerns      HPI:   Update 09/29/2021 JM: patient returns for 6 month stroke follow up accompanied by his sister. He continues to reside at [facility]. Has been doing well since prior visit, denies new stroke/TIA symptoms. Vision remains poor but does have multiple ophthalmic conditions currently being followed by Dr. Katy Fitch.  Has follow-up visit next month.    Mentions concerns regarding ongoing issues with insomnia which has been present since 2005.  Does have daytime fatigue but believes this is due to not being able to fall asleep.  He is concerned regarding possible ADD and questions use of Ritalin.  He has not previously underwent sleep study but is ultimately adamant that he does not have sleep apnea as he does not have any "breathing issues".  Compliant on Xarelto and atorvastatin, denies side effects Blood pressure today 106/52. Monitored at [facility] at least 1x monthly  He is considering returning back to PCP for routine management  No further concerns at this time    History provided for reference purposes  Update 03/22/2021 JM: Returns for 75-monthstroke follow-up accompanied by his sister, SDalbert Garnetto reside at [facility].  Stable from stroke standpoint -denies new stroke/TIA symptoms Residual visual impairment stable. Seen by ophthalmology - scheduled for cataract removal OD 12/22 and OS 1/5.  On exam per pt, still some left sided peripheral vision loss - unable to view via epic   Compliant on Xarelto and atorvastatin -denies side effects Blood pressure today  120/61  Routinely followed by Dr. MAundra Dubincardiologist   No new concerns at this time    Initial visit 11/12/2020 JM: Greg Robertson being seen for hospital follow-up accompanied by his sister.  He has since been discharged from rehab on 10/20/2020 and [facility]  Reports vision has been gradually improving -he is unable to notice any residual peripheral vision concerns.  He is currently being followed by ophthalmology for cataracts and has history of R CRAO with residual visual impairment.  Denies new stroke/TIA symptoms.  Repeat CT head showed improvement of prior bleed and [facility] as cardiologist Dr. WShan Levansdue to difficulty contacting -he requests that this be further discussed at today's visit and potentially starting.  Discussed possibly initiating Eliquis - hx of intolerance reporting "boils in my nose, almost like large pimples. They would pop and then another one would show up. I stopped taking it for a couple of months and they went away. When I tried to restart Eliquis, they came back again".  He has not previously trialed Xarelto or Pradaxa.  Per sister, he plans to establish care with Dr MAundra Dubinwith initial visit on 8/16.  He is currently on aspirin 81 mg daily and atorvastatin 40 mg daily without associated side effects.  Blood pressure today 124/72.  No further concerns at this time.   Stroke admission 09/27/2020 Mr. TYOSHIHARU BRASSELLis a 71y.o. male w/pmh of atrial fibrillation not on  AC, DM2 poorly controlled, former smoker, prior R CRAO, HTN who presented on 09/27/2020 with left sided vision loss.  Personally reviewed hospitalization pertinent progress notes, lab work and imaging summary.  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 fib not on AC vs reduced EF 30 to 35% in setting of HF.  Recommend initiating AC but deferred on starting due to  significant hemorrhagic conversion recommended follow-up imaging 10 to 14 days prior to starting.  He was discharged on aspirin 81 mg daily and atorvastatin 40 mg daily.  LDL 128.  A1c 8.7.  HTN stable during admission.  He was discharged to [facility] rehab on 10/01/2020.       ROS:   14 system review of systems performed and negative with exception of decreased vision   PMH:  Past Medical History:  Diagnosis Date   Allergic rhinitis    Anemia    Atrial fibrillation ([place])    Persistent   Cancer of ascending colon, 7cm 05/25/2011   Congestive heart failure (CHF) (HCC)    Diabetes mellitus type I ([place])    Glaucoma    Hypertension    Pneumonia 1979 or 1980   Prostate nodule    Substance abuse ([place])     PSH:  Past Surgical History:  Procedure Laterality Date   broken left shoulder blade and collar bone     COLONOSCOPY WITH PROPOFOL N/A 04/29/2021   Procedure: COLONOSCOPY WITH PROPOFOL;  Surgeon: Milus Banister, MD;  Location: [facility];  Service: Endoscopy;  Laterality: N/A;   ESOPHAGOGASTRODUODENOSCOPY (EGD) WITH PROPOFOL N/A 04/29/2021   Procedure: ESOPHAGOGASTRODUODENOSCOPY (EGD) WITH PROPOFOL;  Surgeon: Milus Banister, MD;  Location: [facility];  Service: Endoscopy;  Laterality: N/A;   FINGER SURGERY  2009   right middle   HEMOSTASIS CLIP PLACEMENT  04/29/2021   Procedure: HEMOSTASIS CLIP PLACEMENT;  Surgeon: Milus Banister, MD;  Location: [facility];  Service: Endoscopy;;   LAPAROSCOPIC ASSISTED 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     NASAL FRACTURE SURGERY  1968   POLYPECTOMY  04/29/2021   Procedure: POLYPECTOMY;  Surgeon: Milus Banister, MD;  Location: [facility];  Service: Endoscopy;;   PORTACATH PLACEMENT  07/01/2011   Procedure: INSERTION PORT-A-CATH;  Surgeon: Adin Hector, MD;  Location: [facility];  Service: General;  Laterality: Left;  Insertion of Port-A-Catheter Left Internal Jugular   RIGHT/LEFT HEART CATH AND CORONARY ANGIOGRAPHY  N/A 12/08/2020   Procedure: RIGHT/LEFT HEART CATH AND CORONARY ANGIOGRAPHY;  Surgeon: Larey Dresser, MD;  Location: [facility];  Service: Cardiovascular;  Laterality: N/A;   TONSILLECTOMY  1957 - approximate    Social History:  Social History   Socioeconomic History   Marital status: Single    Spouse name: Not on file   Number of children: 0   Years of education: 16   Highest education level: Bachelor's degree (e.g., BA, AB, BS)  Occupational History   Occupation: Retired    Comment: Press photographer  Tobacco Use   Smoking status: Former    Packs/day: 1.00    Years: 15.00    Total pack years: 15.00    Types: Cigarettes    Quit date: 04/18/1993    Years since quitting: 28.4   Smokeless tobacco: Never   Tobacco comments:    marijuana every night   Vaping Use   Vaping Use: Never used  Substance and Sexual Activity   Alcohol use: Not Currently    Alcohol/week:  5.0 standard drinks of alcohol    Types: 3 Cans of beer, 2 Shots of liquor per week    Comment: 1 drink every 2 days   Drug use: Not Currently    Types: Marijuana    Comment: once a night marijuana   Sexual activity: Not Currently  Other Topics Concern   Not on file  Social History Narrative   Lives alone.  Retired from [employer].    Social Determinants of Health   Financial Resource Strain: Low Risk  (10/01/2020)   Overall Financial Resource Strain (CARDIA)    Difficulty of Paying Living Expenses: Not hard at all  Food Insecurity: No Food Insecurity (10/01/2020)   Hunger Vital Sign    Worried About Running Out of Food in the Last Year: Never true    Ran Out of Food in the Last Year: Never true  Transportation Needs: No Transportation Needs (10/01/2020)   PRAPARE - Hydrologist (Medical): No    Lack of Transportation (Non-Medical): No  Physical Activity: Sufficiently Active (11/15/2019)   Exercise Vital Sign    Days of Exercise per Week: 5 days    Minutes of Exercise per  Session: 40 min  Stress: No Stress Concern Present (11/15/2019)   [place]    Feeling of Stress : Only a little  Social Connections: Moderately Isolated (11/15/2019)   Social Connection and Isolation Panel [NHANES]    Frequency of Communication with Friends and Family: More than three times a week    Frequency of Social Gatherings with Friends and Family: Three times a week    Attends Religious Services: 1 to 4 times per year    Active Member of Clubs or Organizations: No    Attends Archivist Meetings: Never    Marital Status: Never married  Intimate Partner Violence: Not At Risk (11/15/2019)   Humiliation, Afraid, Rape, and Kick questionnaire    Fear of Current or Ex-Partner: No    Emotionally Abused: No    Physically Abused: No    Sexually Abused: No    Family History:  Family History  Problem Relation Age of Onset   Colon polyps Father    Lung cancer Father    Diabetes Father    Prostate cancer Father    Breast cancer Mother    Pancreatitis Mother        intestinal adhesions   Insomnia Mother    Colon cancer Neg Hx    Rectal cancer Neg Hx     Medications:   Current Outpatient Medications on File Prior to Visit  Medication Sig Dispense Refill   acetaminophen (TYLENOL) 500 MG tablet Take 1,000 mg by mouth every 6 (six) hours as needed for mild pain or headache. Pain     atorvastatin (LIPITOR) 40 MG tablet Take 1 tablet (40 mg total) by mouth daily. 30 tablet 3   carvedilol (COREG) 12.5 MG tablet Take 1.5 tablets (18.75 mg total) by mouth 2 (two) times daily with a meal. 270 tablet 3   Cholecalciferol 50 MCG (2000 UT) TABS Take 2 tablets by mouth daily.     Continuous Blood Gluc Sensor (FREESTYLE LIBRE 2 SENSOR) MISC 1 each by Does not apply route every 14 (fourteen) days 6 each 3   furosemide (LASIX) 20 MG tablet Take 20 mg by mouth daily at 2 PM. 20 mg q PM     furosemide (LASIX) 40 MG tablet  Take  40 mg by mouth daily. 40 mg q am     glucose blood test strip Use as instructed 100 each 12   insulin aspart (NOVOLOG) 100 UNIT/ML injection Inject 17 Units into the skin 2 (two) times daily. 11 units with dinner     insulin degludec (TRESIBA FLEXTOUCH) 100 UNIT/ML FlexTouch Pen Inject 25 Units into the skin every morning.     Insulin Pen Needle 32G X 4 MM MISC Use as directed 100 each 0   levocetirizine (XYZAL) 5 MG tablet Take 1 tablet (5 mg total) by mouth daily. 90 tablet 3   Multiple Vitamin (MULTIVITAMIN WITH MINERALS) TABS tablet Take 1 tablet by mouth daily. (0800)     sacubitril-valsartan (ENTRESTO) 97-103 MG Take 1 tablet by mouth 2 (two) times daily. 180 tablet 3   sodium zirconium cyclosilicate (LOKELMA) 10 g PACK packet Take 10 g by mouth daily. 90 packet 3   spironolactone (ALDACTONE) 25 MG tablet Take 0.5 tablets (12.5 mg total) by mouth daily. 45 tablet 3   triamcinolone (NASACORT) 55 MCG/ACT AERO nasal inhaler INSTILL 1 SPRAY IN EACH NOSTRIL ONCE OR TWICE DAILY AS NEEDED. 16.5 Bottle 11   vitamin C (ASCORBIC ACID) 500 MG tablet Take 500 mg by mouth in the morning. (0800)     XARELTO 20 MG TABS tablet TAKE ONE TABLET BY MOUTH EVERY EVENING WITH SUPPER 30 tablet 11   No current facility-administered medications on file prior to visit.    Allergies:   Allergies  Allergen Reactions   Oxycodone Nausea And Vomiting      OBJECTIVE:  Physical Exam  Vitals:   09/29/21 1336  BP: (!) 106/52  Pulse: 73  Weight: 204 lb (92.5 kg)  Height: '6\' 2"'$  (1.88 m)   Body mass index is 26.19 kg/m. No results found.   General: well developed, well nourished, very pleasant elderly Caucasian male, seated, in no evident distress Head: head normocephalic and atraumatic.   Neck: supple with no carotid or supraclavicular bruits Cardiovascular: regular rate and rhythm, no murmurs Musculoskeletal: no deformity Skin:  no rash/petichiae Vascular:  Normal pulses all extremities    Neurologic Exam Mental Status: Awake and fully alert.  Fluent speech and language.  Oriented to place and time. Recent and remote memory intact. Attention span, concentration and fund of knowledge appropriate. Mood and affect appropriate.  Cranial Nerves: Pupils equal, briskly reactive to light. Extraocular movements full without nystagmus. Visual fields full to confrontation although decreased/blurred vision left upper quadrant bilaterally. Hearing intact. Facial sensation intact. Face, tongue, palate moves normally and symmetrically.  Motor: Normal bulk and tone. Normal strength in all tested extremity muscles Sensory.: intact to touch , pinprick , position and vibratory sensation.  Coordination: Rapid alternating movements normal in all extremities. Finger-to-nose and heel-to-shin performed accurately bilaterally. Gait and Station: Arises from chair without difficulty. Stance is normal. Gait demonstrates normal stride length and balance without use of assistive device. Tandem walk and heel toe with mild difficulty.  Reflexes: 1+ and symmetric. Toes downgoing.          ASSESSMENT: Greg Robertson is a 71 y.o. year old male with right PCA stroke with extensive hemorrhagic conversion on 9/56/2130, cardioembolic likely due to AF not on AC vs reduced EF in setting of HF. Vascular risk factors include HTN, HLD, DM, AFib, history R CRAO and former tobacco use.      PLAN:  Right PCA stroke with hemorrhagic conversion:  Residual deficit: Visual impairment - left upper quad bilaterally.  Stable.  Monitored by ophthalmology Repeat CT head 10/27/2020 stable Continue Xarelto 20 mg daily and atorvastatin 40 mg for secondary stroke prevention Discussed secondary stroke prevention measures and importance of close PCP follow up for aggressive stroke risk factor management including BP goal<130/90, DM with A1c goal<7 and HLD with LDL goal<70.  LDL 68 (05/2021), A1c 9.2 (08/2021)  I have gone over the  pathophysiology of stroke, warning signs and symptoms, risk factors and their management in some detail with instructions to go to the closest emergency room for symptoms of concern. Atrial fibrillation: On Xarelto 20 mg daily for CHA2DS2-VASc score of at least 7.  Followed by Dr. Aundra Dubin.  History of Eliquis intolerance At risk for sleep apnea: Complains of insomnia and ultimately daytime fatigue.  Due to history of prior stroke and atrial fibrillation as well as other cardiac conditions, he eventually agreed to further evaluation of possible underlying sleep apnea.  He was advised that he does not have sleep apnea, he is to follow-up with his PCP to discuss his insomnia concerns    Doing well from stroke standpoint without further recommendations and risk factors are managed by PCP. He may follow up PRN, as usual for our patients who are strictly being followed for stroke. If any new neurological issues should arise, request PCP place referral for evaluation by one of our neurologists. Thank you.      I spent 38 minutes of face-to-face and non-face-to-face time with patient and sister.  This included previsit chart review, lab review, study review, order entry, electronic health record documentation, patient and sister education and discussion regarding above diagnoses including education, interventions and answered all other questions to patient and sisters satisfaction  Frann Rider, [facility]  [facility] [address] [place], [place] [address]  Phone [phone] Fax [phone] Note: This document was prepared with digital dictation and possible smart phrase technology. Any transcriptional errors that result from this process are unintentional.

## 2021-09-29 NOTE — Patient Instructions (Addendum)
As you have been stable from a stroke standpoint, recommend follow-up on an as-needed basis   Recommendations:  Would recommend evaluation by one of our sleep specialists for possible underlying sleep apnea - you will be called to schedule initial evaluation   Continue Xarelto (rivaroxaban) daily  and atorvastatin for secondary stroke prevention with history of atrial fibrillation  Continue to follow up with PCP regarding cholesterol, blood pressure and diabetes management  Maintain strict control of hypertension with blood pressure goal below 130/90, diabetes with hemoglobin A1c goal below 7.0 % and cholesterol with LDL cholesterol (bad cholesterol) goal below 70 mg/dL.   Signs of a Stroke? Follow the BEFAST method:  Balance Watch for a sudden loss of balance, trouble with coordination or vertigo Eyes Is there a sudden loss of vision in one or both eyes? Or double vision?  Face: Ask the person to smile. Does one side of the face droop or is it numb?  Arms: Ask the person to raise both arms. Does one arm drift downward? Is there weakness or numbness of a leg? Speech: Ask the person to repeat a simple phrase. Does the speech sound slurred/strange? Is the person confused ? Time: If you observe any of these signs, call 911.          Thank you for coming to see [place] at [facility]. I hope we have been able to provide you high quality care today.  You may receive a patient satisfaction survey over the next few weeks. We would appreciate your feedback and comments so that we may continue to improve ourselves and the health of our patients.     Sleep Apnea Sleep apnea affects breathing during sleep. It causes breathing to stop for 10 seconds or more, or to become shallow. People with sleep apnea usually snore loudly. It can also increase the risk of: Heart attack. Stroke. Being very overweight (obese). Diabetes. Heart failure. Irregular heartbeat. High blood  pressure. The goal of treatment is to help you breathe normally again. What are the causes?  The most common cause of this condition is a collapsed or blocked airway. There are three kinds of sleep apnea: Obstructive sleep apnea. This is caused by a blocked or collapsed airway. Central sleep apnea. This happens when the brain does not send the right signals to the muscles that control breathing. Mixed sleep apnea. This is a combination of obstructive and central sleep apnea. What increases the risk? Being overweight. Smoking. Having a small airway. Being older. Being male. Drinking alcohol. Taking medicines to calm yourself (sedatives or tranquilizers). Having family members with the condition. Having a tongue or tonsils that are larger than normal. What are the signs or symptoms? Trouble staying asleep. Loud snoring. Headaches in the morning. Waking up gasping. Dry mouth or sore throat in the morning. Being sleepy or tired during the day. If you are sleepy or tired during the day, you may also: Not be able to focus your mind (concentrate). Forget things. Get angry a lot and have mood swings. Feel sad (depressed). Have changes in your personality. Have less interest in sex, if you are male. Be unable to have an erection, if you are male. How is this treated?  Sleeping on your side. Using a medicine to get rid of mucus in your nose (decongestant). Avoiding the use of alcohol, medicines to help you relax, or certain pain medicines (narcotics). Losing weight, if needed. Changing your diet. Quitting smoking. Using a machine to open your  airway while you sleep, such as: An oral appliance. This is a mouthpiece that shifts your lower jaw forward. A CPAP device. This device blows air through a mask when you breathe out (exhale). An EPAP device. This has valves that you put in each nostril. A BIPAP device. This device blows air through a mask when you breathe in (inhale) and  breathe out. Having surgery if other treatments do not work. Follow these instructions at home: Lifestyle Make changes that your doctor recommends. Eat a healthy diet. Lose weight if needed. Avoid alcohol, medicines to help you relax, and some pain medicines. Do not smoke or use any products that contain nicotine or tobacco. If you need help quitting, ask your doctor. General instructions Take over-the-counter and prescription medicines only as told by your doctor. If you were given a machine to use while you sleep, use it only as told by your doctor. If you are having surgery, make sure to tell your doctor you have sleep apnea. You may need to bring your device with you. Keep all follow-up visits. Contact a doctor if: The machine that you were given to use during sleep bothers you or does not seem to be working. You do not get better. You get worse. Get help right away if: Your chest hurts. You have trouble breathing in enough air. You have an uncomfortable feeling in your back, arms, or stomach. You have trouble talking. One side of your body feels weak. A part of your face is hanging down. These symptoms may be an emergency. Get help right away. Call your local emergency services (911 in the [place] Do not wait to see if the symptoms will go away. Do not drive yourself to the hospital. Summary This condition affects breathing during sleep. The most common cause is a collapsed or blocked airway. The goal of treatment is to help you breathe normally while you sleep. This information is not intended to replace advice given to you by your health care provider. Make sure you discuss any questions you have with your health care provider. Document Revised: 11/11/2020 Document Reviewed: 03/13/2020 Elsevier Patient Education  [place].

## 2021-09-29 NOTE — Telephone Encounter (Signed)
Referral for Sleep Study sent to [facility] [phone].

## 2021-10-04 DIAGNOSIS — R600 Localized edema: Secondary | ICD-10-CM | POA: Diagnosis not present

## 2021-10-04 DIAGNOSIS — J309 Allergic rhinitis, unspecified: Secondary | ICD-10-CM | POA: Diagnosis not present

## 2021-10-04 DIAGNOSIS — E1065 Type 1 diabetes mellitus with hyperglycemia: Secondary | ICD-10-CM | POA: Diagnosis not present

## 2021-10-13 ENCOUNTER — Inpatient Hospital Stay: Payer: Medicare Other | Attending: Nurse Practitioner

## 2021-10-13 ENCOUNTER — Encounter: Payer: Self-pay | Admitting: Nurse Practitioner

## 2021-10-13 ENCOUNTER — Other Ambulatory Visit: Payer: Self-pay

## 2021-10-13 ENCOUNTER — Other Ambulatory Visit (HOSPITAL_COMMUNITY): Payer: Self-pay | Admitting: Cardiology

## 2021-10-13 ENCOUNTER — Inpatient Hospital Stay (HOSPITAL_BASED_OUTPATIENT_CLINIC_OR_DEPARTMENT_OTHER): Payer: Medicare Other | Admitting: Nurse Practitioner

## 2021-10-13 VITALS — BP 111/58 | HR 74 | Temp 98.2°F | Resp 18 | Ht 74.0 in | Wt 202.6 lb

## 2021-10-13 DIAGNOSIS — C182 Malignant neoplasm of ascending colon: Secondary | ICD-10-CM

## 2021-10-13 DIAGNOSIS — Z85038 Personal history of other malignant neoplasm of large intestine: Secondary | ICD-10-CM | POA: Insufficient documentation

## 2021-10-13 DIAGNOSIS — Z794 Long term (current) use of insulin: Secondary | ICD-10-CM | POA: Diagnosis not present

## 2021-10-13 DIAGNOSIS — E119 Type 2 diabetes mellitus without complications: Secondary | ICD-10-CM | POA: Insufficient documentation

## 2021-10-13 LAB — CEA (ACCESS): CEA (CHCC): 2.84 ng/mL (ref 0.00–5.00)

## 2021-10-13 NOTE — Progress Notes (Signed)
  [facility] OFFICE PROGRESS NOTE   Diagnosis: Colon cancer, hereditary nonpolyposis colon cancer syndrome  INTERVAL HISTORY:   Greg Robertson returns as scheduled.  No change in bowel habits.  No bleeding with bowel movements.  He denies abdominal pain.  No nausea or vomiting.  He has a good appetite.  Objective:  Vital signs in last 24 hours:  Blood pressure (!) 111/58, pulse 74, temperature 98.2 F (36.8 C), temperature source Oral, resp. rate 18, height $RemoveBe'6\' 2"'PfHFENYDN$  (1.88 m), weight 202 lb 9.6 oz (91.9 kg), SpO2 98 %.    Lymphatics: No palpable cervical, supraclavicular, axillary or inguinal lymph nodes. Resp: Lungs clear bilaterally. Cardio: Regular rate and rhythm. GI: Abdomen soft and nontender.  No hepatosplenomegaly.  No mass. Vascular: No leg edema.  Brown discoloration at the pretibial regions bilaterally.   Lab Results:  Lab Results  Component Value Date   WBC 7.3 05/19/2021   HGB 12.8 (L) 05/19/2021   HCT 37.7 (L) 05/19/2021   MCV 88.7 05/19/2021   PLT 142 (L) 05/19/2021   NEUTROABS 5.4 10/29/2020    Imaging:  No results found.  Medications: I have reviewed the patient's current medications.  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 Tumor microsatellite unstable, K-ras wild type. Adjuvant FOLFOX chemotherapy initiated on 07/14/2011. Cycle 12 given on 01/05/2012.   -Negative surveillance colonoscopy 06/06/2012   -Negative surveillance CT scans 04/25/2013 -Negative surveillance CT scans 07/21/2014 -Negative surveillance colonoscopy 09/10/2014 -Negative surveillance colonoscopy 01/16/2018 -Negative surveillance colonoscopy 04/29/2021 Microcytic anemia. Likely secondary to iron deficiency. Resolved. Insulin-dependent diabetes. Glaucoma. 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 He has seen a Retail buyer. He has been contacted with updated information regarding screening recommendations. Status post Port-A-Cath placement 07/01/2011. The Port-A-Cath has been removed. History of thrombocytopenia secondary to chemotherapy. Oxaliplatin was held with cycle 3. Oxaliplatin was resumed with cycle 4 at a 25% dose reduction. Oxaliplatin was further dose reduced beginning with cycle 6 due to cytopenias. The oxaliplatin was held with cycle 8, cycle 10 and cycle 11. History of neutropenia secondary to chemotherapy. Chronic mild elevation of the CEA Upper endoscopy 01/16/2018-normal esophagus;  mild gastritis; 1 duodenal polyp (tubular adenoma); distal stomach biopsy with antral and corpus mucosa with slight chronic inflammation, negative for H. Pylori,  no intestinal metaplasia dysplasia or malignancy.  Upper endoscopy 04/29/2021-single 13 mm sessile polyp third portion of the duodenum (tubular adenoma, negative for high-grade dysplasia) Right PCA distribution CVA June 2022    Disposition: Greg Robertson remains in clinical remission from colon cancer.  We will follow-up on the CEA from today.  He will return for CEA and follow-up visit in 1 year.  He continues surveillance endoscopies with Dr. Ardis Hughs, next due January 2024.    Ned Card ANP/GNP-BC   10/13/2021  1:41 PM

## 2021-10-14 ENCOUNTER — Telehealth: Payer: Self-pay

## 2021-10-14 NOTE — Telephone Encounter (Signed)
Per Ned Card, NP, spoke with pt regarding CEA results. CEA is in the normal range. Informed pt that CEA results have also been faxed to [facility] [phone]) as requested. Pt verbalized understanding and will call [facility] for further questions/concerns.

## 2021-10-20 ENCOUNTER — Encounter (INDEPENDENT_AMBULATORY_CARE_PROVIDER_SITE_OTHER): Payer: Self-pay

## 2021-10-20 DIAGNOSIS — H02834 Dermatochalasis of left upper eyelid: Secondary | ICD-10-CM | POA: Diagnosis not present

## 2021-10-20 DIAGNOSIS — H348112 Central retinal vein occlusion, right eye, stable: Secondary | ICD-10-CM | POA: Diagnosis not present

## 2021-10-20 DIAGNOSIS — H40033 Anatomical narrow angle, bilateral: Secondary | ICD-10-CM | POA: Diagnosis not present

## 2021-10-20 DIAGNOSIS — H53462 Homonymous bilateral field defects, left side: Secondary | ICD-10-CM | POA: Diagnosis not present

## 2021-10-20 DIAGNOSIS — Z961 Presence of intraocular lens: Secondary | ICD-10-CM | POA: Diagnosis not present

## 2021-10-20 DIAGNOSIS — H401134 Primary open-angle glaucoma, bilateral, indeterminate stage: Secondary | ICD-10-CM | POA: Diagnosis not present

## 2021-10-20 DIAGNOSIS — H02831 Dermatochalasis of right upper eyelid: Secondary | ICD-10-CM | POA: Diagnosis not present

## 2021-10-22 DIAGNOSIS — F5101 Primary insomnia: Secondary | ICD-10-CM | POA: Diagnosis not present

## 2021-10-25 ENCOUNTER — Encounter (INDEPENDENT_AMBULATORY_CARE_PROVIDER_SITE_OTHER): Payer: Self-pay | Admitting: Ophthalmology

## 2021-10-25 ENCOUNTER — Encounter (INDEPENDENT_AMBULATORY_CARE_PROVIDER_SITE_OTHER): Payer: Medicare Other | Admitting: Ophthalmology

## 2021-10-25 ENCOUNTER — Ambulatory Visit (INDEPENDENT_AMBULATORY_CARE_PROVIDER_SITE_OTHER): Payer: Medicare Other | Admitting: Ophthalmology

## 2021-10-25 DIAGNOSIS — H348112 Central retinal vein occlusion, right eye, stable: Secondary | ICD-10-CM | POA: Diagnosis not present

## 2021-10-25 DIAGNOSIS — Z961 Presence of intraocular lens: Secondary | ICD-10-CM | POA: Insufficient documentation

## 2021-10-25 DIAGNOSIS — I2510 Atherosclerotic heart disease of native coronary artery without angina pectoris: Secondary | ICD-10-CM

## 2021-10-25 DIAGNOSIS — F4323 Adjustment disorder with mixed anxiety and depressed mood: Secondary | ICD-10-CM | POA: Diagnosis not present

## 2021-10-25 DIAGNOSIS — E113412 Type 2 diabetes mellitus with severe nonproliferative diabetic retinopathy with macular edema, left eye: Secondary | ICD-10-CM | POA: Insufficient documentation

## 2021-10-25 NOTE — Progress Notes (Signed)
10/25/2021     CHIEF COMPLAINT Patient presents for  Chief Complaint  Patient presents with   Retina Evaluation      HISTORY OF PRESENT ILLNESS: Greg Robertson is a 71 y.o. male who presents to the clinic today for:   HPI     Retina Evaluation           Associated Symptoms: Negative for Flashes, Floaters, Distortion, Blind Spot, Pain, Redness, Photophobia, Glare, Trauma, Scalp Tenderness, Jaw Claudication, Shoulder/Hip pain, Fever, Weight Loss and Fatigue         Comments   NP- DR w/ME ref by C. Groat. "I had a stroke in June of last year and it seem to have only affected my vision. I feel like my vision isn't as well as it was before my cataract surgery. Dr. Katy Fitch said my vision remained the same before the procedure but it everything seems to be kind of blurry." Pt had cataract sx in the left eye on 04/22/21 performed by Dr. Shirleen Schirmer and the right eye was on 04/08/2021. Pt reports pt had procedure done for glaucoma.          Last edited by Silvestre Moment on 10/25/2021 10:03 AM.      Referring physician: Warden Fillers, MD [address] [place],  [place] [address]  HISTORICAL INFORMATION:   Selected notes from the MEDICAL RECORD NUMBER    Lab Results  Component Value Date   HGBA1C 8.2 (H) 03/29/2021     CURRENT MEDICATIONS: No current outpatient medications on file. (Ophthalmic Drugs)   No current facility-administered medications for this visit. (Ophthalmic Drugs)   Current Outpatient Medications (Other)  Medication Sig   acetaminophen (TYLENOL) 500 MG tablet Take 1,000 mg by mouth every 6 (six) hours as needed for mild pain or headache. Pain   atorvastatin (LIPITOR) 40 MG tablet Take 1 tablet (40 mg total) by mouth daily.   carvedilol (COREG) 12.5 MG tablet Take 1.5 tablets (18.75 mg total) by mouth 2 (two) times daily with a meal.   Cholecalciferol 50 MCG (2000 UT) TABS Take 2 tablets by mouth daily.   Continuous Blood Gluc Sensor  (FREESTYLE LIBRE 2 SENSOR) MISC 1 each by Does not apply route every 14 (fourteen) days   furosemide (LASIX) 20 MG tablet Take 20 mg by mouth daily at 2 PM. 20 mg q PM (Patient not taking: Reported on 10/13/2021)   furosemide (LASIX) 40 MG tablet Take 40 mg by mouth daily. 40 mg q am   glucose blood test strip Use as instructed   insulin aspart (NOVOLOG) 100 UNIT/ML injection Inject 17 Units into the skin 2 (two) times daily. 11 units with dinner   insulin degludec (TRESIBA FLEXTOUCH) 100 UNIT/ML FlexTouch Pen Inject 25 Units into the skin every morning.   Insulin Pen Needle 32G X 4 MM MISC Use as directed   levocetirizine (XYZAL) 5 MG tablet Take 1 tablet (5 mg total) by mouth daily.   Multiple Vitamin (MULTIVITAMIN WITH MINERALS) TABS tablet Take 1 tablet by mouth daily. (0800)   sacubitril-valsartan (ENTRESTO) 97-103 MG Take 1 tablet by mouth 2 (two) times daily.   sodium zirconium cyclosilicate (LOKELMA) 10 g PACK packet Take 10 g by mouth daily.   spironolactone (ALDACTONE) 25 MG tablet Take 0.5 tablets (12.5 mg total) by mouth daily.   triamcinolone (NASACORT) 55 MCG/ACT AERO nasal inhaler INSTILL 1 SPRAY IN EACH NOSTRIL ONCE OR TWICE DAILY AS NEEDED. (Patient not taking: Reported on  10/13/2021)   vitamin C (ASCORBIC ACID) 500 MG tablet Take 500 mg by mouth in the morning. (0800)   XARELTO 20 MG TABS tablet TAKE ONE TABLET BY MOUTH EVERY EVENING WITH SUPPER   No current facility-administered medications for this visit. (Other)      REVIEW OF SYSTEMS: ROS   Negative for: Constitutional, Gastrointestinal, Neurological, Skin, Genitourinary, Musculoskeletal, HENT, Endocrine, Cardiovascular, Eyes, Respiratory, Psychiatric, Allergic/Imm, Heme/Lymph Last edited by Silvestre Moment on 10/25/2021 10:03 AM.       ALLERGIES Allergies  Allergen Reactions   Oxycodone Nausea And Vomiting    PAST MEDICAL HISTORY Past Medical History:  Diagnosis Date   Allergic rhinitis    Anemia    Atrial  fibrillation ([place])    Persistent   Cancer of ascending colon, 7cm 05/25/2011   Congestive heart failure (CHF) ([place])    Diabetes mellitus type I ([place])    Glaucoma    Hypertension    Pneumonia 1979 or 1980   Prostate nodule    Substance abuse ([place])    Past Surgical History:  Procedure Laterality Date   broken left shoulder blade and collar bone     COLONOSCOPY WITH PROPOFOL N/A 04/29/2021   Procedure: COLONOSCOPY WITH PROPOFOL;  Surgeon: Milus Banister, MD;  Location: [facility];  Service: Endoscopy;  Laterality: N/A;   ESOPHAGOGASTRODUODENOSCOPY (EGD) WITH PROPOFOL N/A 04/29/2021   Procedure: ESOPHAGOGASTRODUODENOSCOPY (EGD) WITH PROPOFOL;  Surgeon: Milus Banister, MD;  Location: [facility];  Service: Endoscopy;  Laterality: N/A;   FINGER SURGERY  2009   right middle   HEMOSTASIS CLIP PLACEMENT  04/29/2021   Procedure: HEMOSTASIS CLIP PLACEMENT;  Surgeon: Milus Banister, MD;  Location: [facility];  Service: Endoscopy;;   LAPAROSCOPIC ASSISTED 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     NASAL FRACTURE SURGERY  1968   POLYPECTOMY  04/29/2021   Procedure: POLYPECTOMY;  Surgeon: Milus Banister, MD;  Location: [facility];  Service: Endoscopy;;   PORTACATH PLACEMENT  07/01/2011   Procedure: INSERTION PORT-A-CATH;  Surgeon: Adin Hector, MD;  Location: [facility];  Service: General;  Laterality: Left;  Insertion of Port-A-Catheter Left Internal Jugular   RIGHT/LEFT HEART CATH AND CORONARY ANGIOGRAPHY N/A 12/08/2020   Procedure: RIGHT/LEFT HEART CATH AND CORONARY ANGIOGRAPHY;  Surgeon: Larey Dresser, MD;  Location: [facility];  Service: Cardiovascular;  Laterality: N/A;   TONSILLECTOMY  1957 - approximate    FAMILY HISTORY Family History  Problem Relation Age of Onset   Colon polyps Father    Lung cancer Father    Diabetes Father    Prostate cancer Father    Breast cancer Mother    Pancreatitis Mother        intestinal adhesions   Insomnia Mother    Colon  cancer Neg Hx    Rectal cancer Neg Hx     SOCIAL HISTORY Social History   Tobacco Use   Smoking status: Former    Packs/day: 1.00    Years: 15.00    Total pack years: 15.00    Types: Cigarettes    Quit date: 04/18/1993    Years since quitting: 28.5   Smokeless tobacco: Never   Tobacco comments:    marijuana every night   Vaping Use   Vaping Use: Never used  Substance Use Topics   Alcohol use: Not Currently    Alcohol/week: 5.0 standard drinks of alcohol    Types: 3 Cans of beer, 2 Shots of liquor per week    Comment:  1 drink every 2 days   Drug use: Not Currently    Types: Marijuana    Comment: once a night marijuana         OPHTHALMIC EXAM:  Base Eye Exam     Visual Acuity (ETDRS)       Right Left   Dist [place] 20/150 20/50 -2   Dist ph [place] 20/100 -2 20/30 -1         Tonometry (Tonopen, 10:10 AM)       Right Left   Pressure 17 15         Pupils       Pupils Dark Light Shape React APD   Right PERRL 3 2 Round 1 None   Left PERRL 3 2 Round 1 None         Visual Fields       Left Right    Full Full         Extraocular Movement       Right Left    Full Full         Neuro/Psych     Oriented x3: Yes         Dilation     Both eyes: 1.0% Mydriacyl, 2.5% Phenylephrine @ 10:10 AM           Slit Lamp and Fundus Exam     External Exam       Right Left   External Normal Normal         Slit Lamp Exam       Right Left   Lids/Lashes Normal Normal   Conjunctiva/Sclera White and quiet White and quiet   Cornea Clear Clear   Anterior Chamber Deep and quiet Deep and quiet   Iris Round and reactive Round and reactive   Lens Centered posterior chamber intraocular lens Centered posterior chamber intraocular lens   Anterior Vitreous Normal Normal         Fundus Exam       Right Left   Posterior Vitreous Clear, vitrectomized Normal   Disc Collaterals on the nerve Normal   C/D Ratio 0.9 0.75   Macula Geographic atrophy Mild  clinically significant macular edema, Microaneurysms   Vessels PDR-quiet NPDR-Severe   Periphery Laser scars Normal            IMAGING AND PROCEDURES  Imaging and Procedures for 10/25/21  OCT, Retina - OU - Both Eyes       Right Eye Central Foveal Thickness: 249. Progression has been stable. Findings include central retinal atrophy, inner retinal atrophy, outer retinal atrophy.   Left Eye Central Foveal Thickness: 235. Progression has been stable.      Color Fundus Photography Optos - OU - Both Eyes       Right Eye Progression has been stable. Disc findings include increased cup to disc ratio, pallor. Macula : geographic atrophy.   Left Eye Progression has worsened. Disc findings include increased cup to disc ratio.   Notes OD with quiescent stable old CRVO, attenuated retinal artery and vein.  No active disease no active NVE clear media geographic atrophy centrally limits acuity  OS with severe NPDR              ASSESSMENT/PLAN:  No problem-specific Assessment & Plan notes found for this encounter.      ICD-10-CM   1. 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 Central retinal vein occlusion, right eye, stable  H34.8112 OCT, Retina - OU - Both Eyes    Color Fundus Photography Optos - OU - Both Eyes    3. Pseudophakia of both eyes  Z96.1       1.  OS with minor CSME yet with severe NPDR and monocular patient.  Extensive disease with poorly controlled blood sugar recent vascular disease including CHF.  2.  Will need fluorescein angiography left eye and right eye in the near future and possibly consideration of intravitreal Avastin OS  3.  I have asked patient whether or not sleep study has been done and he reports that he has one scheduled because of the work-up for his recent  stroke.  I explained if sleep apnea is discovered that he should not take every effort to make the treatment work so as to minimize or prevent progression of his other eye diseases as well as systemic issues  Ophthalmic Meds Ordered this visit:  No orders of the defined types were placed in this encounter.      Return for DILATE OU, OPTOS FFA L/R, COLOR FP.  There are no Patient Instructions on file for this visit.   Explained the diagnoses, plan, and follow up with the patient and they expressed understanding.  Patient expressed understanding of the importance of proper follow up care.   Clent Demark Tonnette Zwiebel M.D. Diseases & Surgery of the Retina and Vitreous [facility]abetic [place] 10/25/21     Abbreviations: M myopia (nearsighted); A astigmatism; H hyperopia (farsighted); P presbyopia; Mrx spectacle prescription;  CTL contact lenses; OD right eye; OS left eye; OU both eyes  XT exotropia; ET esotropia; PEK punctate epithelial keratitis; PEE punctate epithelial erosions; DES dry eye syndrome; MGD meibomian gland dysfunction; ATs artificial tears; PFAT's preservative free artificial tears; [place] nuclear sclerotic cataract; PSC posterior subcapsular cataract; ERM epi-retinal membrane; PVD posterior vitreous detachment; RD retinal detachment; DM diabetes mellitus; DR diabetic retinopathy; NPDR non-proliferative diabetic retinopathy; PDR proliferative diabetic retinopathy; CSME clinically significant macular edema; DME diabetic macular edema; dbh dot blot hemorrhages; CWS cotton wool spot; POAG primary open angle glaucoma; C/D cup-to-disc ratio; HVF humphrey visual field; GVF goldmann visual field; OCT optical coherence tomography; IOP intraocular pressure; BRVO Branch retinal vein occlusion; CRVO central retinal vein occlusion; CRAO central retinal artery occlusion; BRAO branch retinal artery occlusion; RT retinal tear; SB scleral buckle; PPV pars plana vitrectomy; VH Vitreous hemorrhage; PRP  panretinal laser photocoagulation; IVK intravitreal kenalog; VMT vitreomacular traction; MH Macular hole;  NVD neovascularization of the disc; NVE neovascularization elsewhere; AREDS age related eye disease study; ARMD age related macular degeneration; POAG primary open angle glaucoma; EBMD epithelial/anterior basement membrane dystrophy; ACIOL anterior chamber intraocular lens; IOL intraocular lens; PCIOL posterior chamber intraocular lens; Phaco/IOL phacoemulsification with intraocular lens placement; [place] photorefractive keratectomy; LASIK laser assisted in situ keratomileusis; HTN hypertension; DM diabetes mellitus; COPD chronic obstructive pulmonary disease

## 2021-10-28 ENCOUNTER — Ambulatory Visit (INDEPENDENT_AMBULATORY_CARE_PROVIDER_SITE_OTHER): Payer: Medicare Other | Admitting: Ophthalmology

## 2021-10-28 ENCOUNTER — Encounter (INDEPENDENT_AMBULATORY_CARE_PROVIDER_SITE_OTHER): Payer: Self-pay | Admitting: Ophthalmology

## 2021-10-28 DIAGNOSIS — E113412 Type 2 diabetes mellitus with severe nonproliferative diabetic retinopathy with macular edema, left eye: Secondary | ICD-10-CM | POA: Diagnosis not present

## 2021-10-28 DIAGNOSIS — I2510 Atherosclerotic heart disease of native coronary artery without angina pectoris: Secondary | ICD-10-CM

## 2021-10-28 DIAGNOSIS — H348112 Central retinal vein occlusion, right eye, stable: Secondary | ICD-10-CM | POA: Diagnosis not present

## 2021-10-28 DIAGNOSIS — H353114 Nonexudative age-related macular degeneration, right eye, advanced atrophic with subfoveal involvement: Secondary | ICD-10-CM | POA: Insufficient documentation

## 2021-10-28 MED ORDER — BEVACIZUMAB 2.5 MG/0.1ML IZ SOSY
2.5000 mg | PREFILLED_SYRINGE | INTRAVITREAL | Status: AC | PRN
  Administered 2021-10-28: 2.5 mg via INTRAVITREAL

## 2021-10-28 NOTE — Progress Notes (Signed)
10/28/2021     CHIEF COMPLAINT Patient presents for  Chief Complaint  Patient presents with   Diabetic Retinopathy with Macular Edema      HISTORY OF PRESENT ILLNESS: Greg Robertson is a 71 y.o. male who presents to the clinic today for:   HPI   Dilate ou, optos ffa L/R, color FP Pt states his vision has been stable  Pt denies any floaters or FOL  Last edited by Morene Rankins, CMA on 10/28/2021 10:45 AM.      Referring physician: Dettinger, Fransisca Kaufmann, MD [place],  [place] [id number]  HISTORICAL INFORMATION:   Selected notes from the MEDICAL RECORD NUMBER    Lab Results  Component Value Date   HGBA1C 8.2 (H) 03/29/2021     CURRENT MEDICATIONS: No current outpatient medications on file. (Ophthalmic Drugs)   No current facility-administered medications for this visit. (Ophthalmic Drugs)   Current Outpatient Medications (Other)  Medication Sig   acetaminophen (TYLENOL) 500 MG tablet Take 1,000 mg by mouth every 6 (six) hours as needed for mild pain or headache. Pain   atorvastatin (LIPITOR) 40 MG tablet Take 1 tablet (40 mg total) by mouth daily.   carvedilol (COREG) 12.5 MG tablet Take 1.5 tablets (18.75 mg total) by mouth 2 (two) times daily with a meal.   Cholecalciferol 50 MCG (2000 UT) TABS Take 2 tablets by mouth daily.   Continuous Blood Gluc Sensor (FREESTYLE LIBRE 2 SENSOR) MISC 1 each by Does not apply route every 14 (fourteen) days   furosemide (LASIX) 20 MG tablet Take 20 mg by mouth daily at 2 PM. 20 mg q PM (Patient not taking: Reported on 10/13/2021)   furosemide (LASIX) 40 MG tablet Take 40 mg by mouth daily. 40 mg q am   glucose blood test strip Use as instructed   insulin aspart (NOVOLOG) 100 UNIT/ML injection Inject 17 Units into the skin 2 (two) times daily. 11 units with dinner   insulin degludec (TRESIBA FLEXTOUCH) 100 UNIT/ML FlexTouch Pen Inject 25 Units into the skin every morning.   Insulin Pen Needle 32G X 4 MM MISC Use as  directed   levocetirizine (XYZAL) 5 MG tablet Take 1 tablet (5 mg total) by mouth daily.   Multiple Vitamin (MULTIVITAMIN WITH MINERALS) TABS tablet Take 1 tablet by mouth daily. (0800)   sacubitril-valsartan (ENTRESTO) 97-103 MG Take 1 tablet by mouth 2 (two) times daily.   sodium zirconium cyclosilicate (LOKELMA) 10 g PACK packet Take 10 g by mouth daily.   spironolactone (ALDACTONE) 25 MG tablet Take 0.5 tablets (12.5 mg total) by mouth daily.   triamcinolone (NASACORT) 55 MCG/ACT AERO nasal inhaler INSTILL 1 SPRAY IN EACH NOSTRIL ONCE OR TWICE DAILY AS NEEDED. (Patient not taking: Reported on 10/13/2021)   vitamin C (ASCORBIC ACID) 500 MG tablet Take 500 mg by mouth in the morning. (0800)   XARELTO 20 MG TABS tablet TAKE ONE TABLET BY MOUTH EVERY EVENING WITH SUPPER   No current facility-administered medications for this visit. (Other)      REVIEW OF SYSTEMS: ROS   Negative for: Constitutional, Gastrointestinal, Neurological, Skin, Genitourinary, Musculoskeletal, HENT, Endocrine, Cardiovascular, Eyes, Respiratory, Psychiatric, Allergic/Imm, Heme/Lymph Last edited by Orene Desanctis D, CMA on 10/28/2021 10:45 AM.       ALLERGIES Allergies  Allergen Reactions   Oxycodone Nausea And Vomiting    PAST MEDICAL HISTORY Past Medical History:  Diagnosis Date   Allergic rhinitis    Anemia  Atrial fibrillation ([place])    Persistent   Cancer of ascending colon, 7cm 05/25/2011   Congestive heart failure (CHF) ([place])    Diabetes mellitus type I ([place])    Glaucoma    Hypertension    Pneumonia 1979 or 1980   Prostate nodule    Substance abuse ([place])    Past Surgical History:  Procedure Laterality Date   broken left shoulder blade and collar bone     COLONOSCOPY WITH PROPOFOL N/A 04/29/2021   Procedure: COLONOSCOPY WITH PROPOFOL;  Surgeon: Milus Banister, MD;  Location: [facility];  Service: Endoscopy;  Laterality: N/A;   ESOPHAGOGASTRODUODENOSCOPY (EGD) WITH PROPOFOL N/A 04/29/2021    Procedure: ESOPHAGOGASTRODUODENOSCOPY (EGD) WITH PROPOFOL;  Surgeon: Milus Banister, MD;  Location: [facility];  Service: Endoscopy;  Laterality: N/A;   FINGER SURGERY  2009   right middle   HEMOSTASIS CLIP PLACEMENT  04/29/2021   Procedure: HEMOSTASIS CLIP PLACEMENT;  Surgeon: Milus Banister, MD;  Location: [facility];  Service: Endoscopy;;   LAPAROSCOPIC ASSISTED 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     NASAL FRACTURE SURGERY  1968   POLYPECTOMY  04/29/2021   Procedure: POLYPECTOMY;  Surgeon: Milus Banister, MD;  Location: [facility];  Service: Endoscopy;;   PORTACATH PLACEMENT  07/01/2011   Procedure: INSERTION PORT-A-CATH;  Surgeon: Adin Hector, MD;  Location: [facility];  Service: General;  Laterality: Left;  Insertion of Port-A-Catheter Left Internal Jugular   RIGHT/LEFT HEART CATH AND CORONARY ANGIOGRAPHY N/A 12/08/2020   Procedure: RIGHT/LEFT HEART CATH AND CORONARY ANGIOGRAPHY;  Surgeon: Larey Dresser, MD;  Location: [facility];  Service: Cardiovascular;  Laterality: N/A;   TONSILLECTOMY  1957 - approximate    FAMILY HISTORY Family History  Problem Relation Age of Onset   Colon polyps Father    Lung cancer Father    Diabetes Father    Prostate cancer Father    Breast cancer Mother    Pancreatitis Mother        intestinal adhesions   Insomnia Mother    Colon cancer Neg Hx    Rectal cancer Neg Hx     SOCIAL HISTORY Social History   Tobacco Use   Smoking status: Former    Packs/day: 1.00    Years: 15.00    Total pack years: 15.00    Types: Cigarettes    Quit date: 04/18/1993    Years since quitting: 28.5   Smokeless tobacco: Never   Tobacco comments:    marijuana every night   Vaping Use   Vaping Use: Never used  Substance Use Topics   Alcohol use: Not Currently    Alcohol/week: 5.0 standard drinks of alcohol    Types: 3 Cans of beer, 2 Shots of liquor per week    Comment: 1 drink every 2 days   Drug use: Not Currently    Types:  Marijuana    Comment: once a night marijuana         OPHTHALMIC EXAM:  Base Eye Exam     Visual Acuity (ETDRS)       Right Left   Dist [place] 20/100 20/50 +2         Tonometry (Tonopen, 10:53 AM)       Right Left   Pressure 13 11         Extraocular Movement       Right Left    Full Full         Neuro/Psych  Oriented x3: Yes         Dilation     Both eyes: 1.0% Mydriacyl, 2.5% Phenylephrine @ 10:51 AM           Slit Lamp and Fundus Exam     External Exam       Right Left   External Normal Normal         Slit Lamp Exam       Right Left   Lids/Lashes Normal Normal   Conjunctiva/Sclera White and quiet White and quiet   Cornea Clear Clear   Anterior Chamber Deep and quiet Deep and quiet   Iris Round and reactive Round and reactive   Lens Centered posterior chamber intraocular lens Centered posterior chamber intraocular lens   Anterior Vitreous Normal Normal         Fundus Exam       Right Left   Posterior Vitreous Clear, vitrectomized Normal   Disc Collaterals on the nerve Normal   C/D Ratio 0.9 0.75   Macula Geographic atrophy Mild clinically significant macular edema, Microaneurysms   Vessels PDR-quiet NPDR-Severe   Periphery Laser scars Normal            IMAGING AND PROCEDURES  Imaging and Procedures for 10/28/21  Color Fundus Photography Optos - OU - Both Eyes       Right Eye Progression has been stable. Disc findings include increased cup to disc ratio, pallor. Macula : geographic atrophy.   Left Eye Progression has worsened. Disc findings include increased cup to disc ratio.   Notes OD with quiescent stable old CRVO, attenuated retinal artery and vein.  No active disease no active NVE clear media geographic atrophy centrally limits acuity  OS with severe NPDR     Fluorescein Angiography Optos (Transit OS)       Right Eye Progression has no prior data. Mid/Late phase findings include window defect.  Choroidal neovascularization is not present.   Left Eye Progression has no prior data. Early phase findings include leakage, microaneurysm, vascular perfusion defect. Mid/Late phase findings include leakage, staining, microaneurysm, vascular perfusion defect. Choroidal neovascularization is not present.   Notes OS with angiographic leakage diffusely in the posterior pole and peripherally associated with vascular.  Perfusion defects, capillary dropout widespread will need initially antivegF, Avastin to in order to protect the eye for the next month in order to also protect the eye from macular edema developing post PRP to the nonperfused areas which will occur in 1 to 2 weeks OD stable, old chronic CRVO with geographic atrophy centrally stable for many years peripheral PRP in place     Intravitreal Injection, Pharmacologic Agent - OS - Left Eye       Time Out 10/28/2021. 11:26 AM. Confirmed correct patient, procedure, site, and patient consented.   Anesthesia Topical anesthesia was used. Anesthetic medications included Lidocaine 4%.   Procedure Preparation included 5% betadine to ocular surface, 10% betadine to eyelids, Tobramycin 0.3%, Ofloxacin . A 30 gauge needle was used.   Injection: 2.5 mg bevacizumab 2.5 MG/0.1ML   Route: Intravitreal, Site: Left Eye   NDC: [phone], Lot: [id number], Expiration date: 12/22/2021   Post-op Post injection exam found visual acuity of at least counting fingers. The patient tolerated the procedure well. There were no complications. The patient received written and verbal post procedure care education. Post injection medications included ocuflox.              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  As well as diffuse macular edema angiographically.   This is a high risk for progression of CSME of peripheral PRP were delivered initially.  Thus we will not perform peripheral PRP today but rather deliver antivegF injection Avastin today and follow-up in 1 to 2 weeks for peripheral PRP to minimize the risk of CSME developing secondary to PRP  Central retinal vein occlusion, right eye, stable OD confirm stability     ICD-10-CM   1. 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 Central retinal vein occlusion, right eye, stable  H34.8112     3. Advanced nonexudative age-related macular degeneration of right eye with subfoveal involvement  H35.3114       1.  OS today confirms massive angiographic leakage throughout the posterior pole signifying extensive severe NPDR secondary to widespread capillary dropout, retinal nonperfusion temporal and inferotemporal periphery as well as some inferonasally.  2.  Original planned PRP OS will be exchanged for injection intravitreal Avastin today to stabilize overall condition and to allow follow-up peripheral PRP in 1 to 2 weeks OS  3.  Stable OD  Ophthalmic Meds Ordered this visit:  Meds ordered this encounter  Medications   bevacizumab (AVASTIN) SOSY 2.5 mg       Return in about 11 days (around 11/08/2021) for Early morning, dilate, OS, PRP temporally and inferotemporally.  There are no Patient Instructions on file for this visit.   Explained the diagnoses, plan, and follow up with the patient and they expressed understanding.  Patient expressed understanding of the importance of proper follow up care.   Clent Demark Makalynn Berwanger M.D. Diseases & Surgery of the Retina and Vitreous [facility]ina [facility] Diabetic [place] 10/28/21     Abbreviations: M myopia (nearsighted); A astigmatism; H hyperopia (farsighted); P presbyopia; Mrx spectacle prescription;  CTL contact lenses; OD right eye; OS left eye; OU both eyes  XT exotropia; ET esotropia; PEK punctate epithelial keratitis; PEE punctate epithelial erosions; DES dry eye syndrome; MGD meibomian gland dysfunction; ATs artificial tears; PFAT's preservative free artificial tears; [place]cal nuclear sclerotic cataract; PSC posterior subcapsular cataract; ERM epi-retinal membrane; PVD posterior vitreous detachment; RD retinal detachment; DM diabetes mellitus; DR diabetic retinopathy; NPDR non-proliferative diabetic retinopathy; PDR proliferative diabetic retinopathy; CSME clinically significant macular edema; DME diabetic macular edema; dbh dot blot hemorrhages; CWS cotton wool spot; POAG primary open angle glaucoma; C/D cup-to-disc ratio; HVF humphrey visual field; GVF goldmann visual field; OCT optical coherence tomography; IOP intraocular pressure; BRVO Branch retinal vein occlusion; CRVO central retinal vein occlusion; CRAO central retinal artery occlusion; BRAO branch retinal artery occlusion; RT retinal tear; SB scleral buckle; PPV pars plana vitrectomy; VH Vitreous hemorrhage; PRP panretinal laser photocoagulation; IVK intravitreal kenalog; VMT vitreomacular traction; MH Macular hole;  NVD neovascularization of the disc; NVE neovascularization elsewhere; AREDS age related eye disease study; ARMD age related macular degeneration; POAG primary open angle glaucoma; EBMD epithelial/anterior basement membrane dystrophy; ACIOL anterior chamber intraocular lens; IOL intraocular lens; PCIOL posterior chamber intraocular lens; Phaco/IOL phacoemulsification with intraocular lens placement; PRK photorefractive keratectomy; LASIK laser assisted in situ keratomileusis; HTN  hypertension; DM diabetes mellitus; COPD chronic obstructive pulmonary disease

## 2021-10-28 NOTE — Assessment & Plan Note (Signed)
Extensive peripheral retinal nonperfusion capillary dropout with nonperfusion really inferiorly.  As well as diffuse macular edema angiographically.  This is a high risk for progression of CSME of peripheral PRP were delivered initially.  Thus we will not perform peripheral PRP today but rather deliver antivegF injection Avastin today and follow-up in 1 to 2 weeks for peripheral PRP to minimize the risk of CSME developing secondary to PRP

## 2021-10-28 NOTE — Assessment & Plan Note (Signed)
OD confirm stability

## 2021-11-01 ENCOUNTER — Other Ambulatory Visit (HOSPITAL_COMMUNITY): Payer: Self-pay

## 2021-11-01 DIAGNOSIS — E782 Mixed hyperlipidemia: Secondary | ICD-10-CM | POA: Diagnosis not present

## 2021-11-01 DIAGNOSIS — I482 Chronic atrial fibrillation, unspecified: Secondary | ICD-10-CM | POA: Diagnosis not present

## 2021-11-01 DIAGNOSIS — E1065 Type 1 diabetes mellitus with hyperglycemia: Secondary | ICD-10-CM | POA: Diagnosis not present

## 2021-11-01 DIAGNOSIS — I10 Essential (primary) hypertension: Secondary | ICD-10-CM | POA: Diagnosis not present

## 2021-11-02 ENCOUNTER — Telehealth (HOSPITAL_COMMUNITY): Payer: Self-pay | Admitting: Pharmacy Technician

## 2021-11-02 NOTE — Telephone Encounter (Signed)
Medication Samples have been provided to the patient.  Drug name: Delene Loll       Strength: 49-'51mg'$         Qty: 2 bottles  LOT: [id number]  Exp.Date: 10/2023  Dosing instructions: Take 2 tablet by mouth twice daily.   The patient has been instructed regarding the correct time, dose, and frequency of taking this medication, including desired effects and most common side effects.   Hector Shade [facility] 10:31 AM 11/02/2021

## 2021-11-03 ENCOUNTER — Telehealth (HOSPITAL_COMMUNITY): Payer: Self-pay | Admitting: Pharmacy Technician

## 2021-11-03 DIAGNOSIS — E1065 Type 1 diabetes mellitus with hyperglycemia: Secondary | ICD-10-CM | POA: Diagnosis not present

## 2021-11-03 DIAGNOSIS — F331 Major depressive disorder, recurrent, moderate: Secondary | ICD-10-CM | POA: Diagnosis not present

## 2021-11-03 NOTE — Telephone Encounter (Signed)
Advanced Heart Failure Patient Advocate Encounter  Patient's sister called stating that the patient's PAN grant had run out of funds. Called and spoke with patient's sister. Was able to obtain a new [employer] grant that will cover the cost of Entresto. Before [employer] is willing to let the patient use the grant, income verification is required. Patient's sister verbalized understanding and is going to bring income information to send to [facility] to the clinic. Provided a month of samples as well. She is aware we have 60 days to complete or the grant will become inactive.   Advanced Heart Failure Patient Advocate Encounter  The patient was approved for a [place] that will help cover the cost of Entresto. Total amount awarded, $8,000. Eligibility, 10/02/21 - 10/02/22.  ID [id number]  BIN [id number]  PCN [id number]  Group [id number]

## 2021-11-03 NOTE — Telephone Encounter (Signed)
Patient's sister came and dropped off POI. Document scanned into [employer] account. Will follow up.

## 2021-11-08 ENCOUNTER — Encounter (INDEPENDENT_AMBULATORY_CARE_PROVIDER_SITE_OTHER): Payer: Medicare Other | Admitting: Ophthalmology

## 2021-11-10 ENCOUNTER — Encounter (INDEPENDENT_AMBULATORY_CARE_PROVIDER_SITE_OTHER): Payer: Self-pay | Admitting: Ophthalmology

## 2021-11-10 ENCOUNTER — Ambulatory Visit (INDEPENDENT_AMBULATORY_CARE_PROVIDER_SITE_OTHER): Payer: Medicare Other | Admitting: Ophthalmology

## 2021-11-10 ENCOUNTER — Encounter (INDEPENDENT_AMBULATORY_CARE_PROVIDER_SITE_OTHER): Payer: Medicare Other | Admitting: Ophthalmology

## 2021-11-10 DIAGNOSIS — E113412 Type 2 diabetes mellitus with severe nonproliferative diabetic retinopathy with macular edema, left eye: Secondary | ICD-10-CM | POA: Diagnosis not present

## 2021-11-10 NOTE — Progress Notes (Signed)
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 male who presents to the clinic today for:   HPI   11 days for early morning dilate OS PRP temporally and infratemporally Pt states his vision has been stable Pt denies any new floaters or FOL   Last edited by Morene Rankins, CMA on 11/10/2021 10:07 AM.      Referring physician: Dettinger, Fransisca Kaufmann, MD [place],  [place] [address]  HISTORICAL INFORMATION:   Selected notes from the MEDICAL RECORD NUMBER    Lab Results  Component Value Date   HGBA1C 8.2 (H) 03/29/2021     CURRENT MEDICATIONS: No current outpatient medications on file. (Ophthalmic Drugs)   No current facility-administered medications for this visit. (Ophthalmic Drugs)   Current Outpatient Medications (Other)  Medication Sig   acetaminophen (TYLENOL) 500 MG tablet Take 1,000 mg by mouth every 6 (six) hours as needed for mild pain or headache. Pain   atorvastatin (LIPITOR) 40 MG tablet Take 1 tablet (40 mg total) by mouth daily.   carvedilol (COREG) 12.5 MG tablet Take 1.5 tablets (18.75 mg total) by mouth 2 (two) times daily with a meal.   Cholecalciferol 50 MCG (2000 UT) TABS Take 2 tablets by mouth daily.   Continuous Blood Gluc Sensor (FREESTYLE LIBRE 2 SENSOR) MISC 1 each by Does not apply route every 14 (fourteen) days   furosemide (LASIX) 20 MG tablet Take 20 mg by mouth daily at 2 PM. 20 mg q PM (Patient not taking: Reported on 10/13/2021)   furosemide (LASIX) 40 MG tablet Take 40 mg by mouth daily. 40 mg q am   glucose blood test strip Use as instructed   insulin aspart (NOVOLOG) 100 UNIT/ML injection Inject 17 Units into the skin 2 (two) times daily. 11 units with dinner   insulin degludec (TRESIBA FLEXTOUCH) 100 UNIT/ML FlexTouch Pen Inject 25 Units into the skin every morning.    Insulin Pen Needle 32G X 4 MM MISC Use as directed   levocetirizine (XYZAL) 5 MG tablet Take 1 tablet (5 mg total) by mouth daily.   Multiple Vitamin (MULTIVITAMIN WITH MINERALS) TABS tablet Take 1 tablet by mouth daily. (0800)   sacubitril-valsartan (ENTRESTO) 97-103 MG Take 1 tablet by mouth 2 (two) times daily.   sodium zirconium cyclosilicate (LOKELMA) 10 g PACK packet Take 10 g by mouth daily.   spironolactone (ALDACTONE) 25 MG tablet Take 0.5 tablets (12.5 mg total) by mouth daily.   triamcinolone (NASACORT) 55 MCG/ACT AERO nasal inhaler INSTILL 1 SPRAY IN EACH NOSTRIL ONCE OR TWICE DAILY AS NEEDED. (Patient not taking: Reported on 10/13/2021)   vitamin C (ASCORBIC ACID) 500 MG tablet Take 500 mg by mouth in the morning. (0800)   XARELTO 20 MG TABS tablet TAKE ONE TABLET BY MOUTH EVERY EVENING WITH SUPPER   No current facility-administered medications for this visit. (Other)      REVIEW OF SYSTEMS: ROS   Negative for: Constitutional, Gastrointestinal, Neurological, Skin, Genitourinary, Musculoskeletal, HENT, Endocrine, Cardiovascular, Eyes, Respiratory, Psychiatric, Allergic/Imm, Heme/Lymph Last edited by Orene Desanctis D, CMA on 11/10/2021 10:07 AM.       ALLERGIES Allergies  Allergen Reactions   Oxycodone Nausea And Vomiting    PAST MEDICAL HISTORY Past Medical History:  Diagnosis Date   Allergic rhinitis  Anemia    Atrial fibrillation (HCC)    Persistent   Cancer of ascending colon, 7cm 05/25/2011   Congestive heart failure (CHF) (HCC)    Diabetes mellitus type I ([place])    Glaucoma    Hypertension    Pneumonia 1979 or 1980   Prostate nodule    Substance abuse ([place])    Past Surgical History:  Procedure Laterality Date   broken left shoulder blade and collar bone     COLONOSCOPY WITH PROPOFOL N/A 04/29/2021   Procedure: COLONOSCOPY WITH PROPOFOL;  Surgeon: Milus Banister, MD;  Location: [facility];  Service: Endoscopy;  Laterality: N/A;    ESOPHAGOGASTRODUODENOSCOPY (EGD) WITH PROPOFOL N/A 04/29/2021   Procedure: ESOPHAGOGASTRODUODENOSCOPY (EGD) WITH PROPOFOL;  Surgeon: Milus Banister, MD;  Location: [facility];  Service: Endoscopy;  Laterality: N/A;   FINGER SURGERY  2009   right middle   HEMOSTASIS CLIP PLACEMENT  04/29/2021   Procedure: HEMOSTASIS CLIP PLACEMENT;  Surgeon: Milus Banister, MD;  Location: [facility];  Service: Endoscopy;;   LAPAROSCOPIC ASSISTED 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     NASAL FRACTURE SURGERY  1968   POLYPECTOMY  04/29/2021   Procedure: POLYPECTOMY;  Surgeon: Milus Banister, MD;  Location: [facility];  Service: Endoscopy;;   PORTACATH PLACEMENT  07/01/2011   Procedure: INSERTION PORT-A-CATH;  Surgeon: Adin Hector, MD;  Location: [facility];  Service: General;  Laterality: Left;  Insertion of Port-A-Catheter Left Internal Jugular   RIGHT/LEFT HEART CATH AND CORONARY ANGIOGRAPHY N/A 12/08/2020   Procedure: RIGHT/LEFT HEART CATH AND CORONARY ANGIOGRAPHY;  Surgeon: Larey Dresser, MD;  Location: [facility];  Service: Cardiovascular;  Laterality: N/A;   TONSILLECTOMY  1957 - approximate    FAMILY HISTORY Family History  Problem Relation Age of Onset   Colon polyps Father    Lung cancer Father    Diabetes Father    Prostate cancer Father    Breast cancer Mother    Pancreatitis Mother        intestinal adhesions   Insomnia Mother    Colon cancer Neg Hx    Rectal cancer Neg Hx     SOCIAL HISTORY Social History   Tobacco Use   Smoking status: Former    Packs/day: 1.00    Years: 15.00    Total pack years: 15.00    Types: Cigarettes    Quit date: 04/18/1993    Years since quitting: 28.5   Smokeless tobacco: Never   Tobacco comments:    marijuana every night   Vaping Use   Vaping Use: Never used  Substance Use Topics   Alcohol use: Not Currently    Alcohol/week: 5.0 standard drinks of alcohol    Types: 3 Cans of beer, 2 Shots of liquor per week    Comment:  1 drink every 2 days   Drug use: Not Currently    Types: Marijuana    Comment: once a night marijuana         OPHTHALMIC EXAM:  Base Eye Exam     Visual Acuity (ETDRS)       Right Left   Dist [place] 20/150 20/30         Tonometry (Tonopen, 10:15 AM)       Right Left   Pressure 16 12         Pupils       Pupils APD   Right PERRL None   Left PERRL  Visual Fields       Left Right    Full    Restrictions  Partial outer superior temporal, inferior temporal, superior nasal, inferior nasal deficiencies         Neuro/Psych     Oriented x3: Yes         Dilation     Left eye: 2.5% Phenylephrine, 1.0% Mydriacyl @ 10:08 AM           Slit Lamp and Fundus Exam     External Exam       Right Left   External Normal Normal         Slit Lamp Exam       Right Left   Lids/Lashes Normal Normal   Conjunctiva/Sclera White and quiet White and quiet   Cornea Clear Clear   Anterior Chamber Deep and quiet Deep and quiet   Iris Round and reactive Round and reactive   Lens Centered posterior chamber intraocular lens Centered posterior chamber intraocular lens   Anterior Vitreous Normal Normal         Fundus Exam       Right Left   Posterior Vitreous  Normal   Disc  Normal   C/D Ratio  0.75   Macula  Mild clinically significant macular edema, Microaneurysms   Vessels  NPDR-Severe   Periphery  Normal            IMAGING AND PROCEDURES  Imaging and Procedures for 11/10/21  Panretinal Photocoagulation - OS - Left Eye       Time Out Confirmed correct patient, procedure, site, and patient consented.   Anesthesia Topical anesthesia was used. Anesthetic medications included Proparacaine 0.5%.   Laser Information The type of laser was diode. Color was yellow. The duration in seconds was 0.04. The spot size was 390 microns. Laser power was 260. Total spots was 410.   Post-op The patient tolerated the procedure well. There were no  complications. The patient received written and verbal post procedure care education.   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  OS, with severe NPDR.  PRP #1 delivered today to prevent the progression to PDR and risk for high risk characteristics or vision loss  2.  3.  Ophthalmic Meds Ordered this visit:  No orders of the defined types were placed in this encounter.      Return in about 4 months (around 03/13/2022) for DILATE OU, COLOR FP.  There are no Patient Instructions on file for this visit.   Explained the diagnoses, plan, and follow up with the patient and they expressed understanding.  Patient expressed understanding of the importance of proper follow up care.   Clent Demark Zamere Pasternak M.D. Diseases & Surgery of the Retina and Vitreous Retina & Diabetic Intercourse 11/10/21     Abbreviations: M myopia (nearsighted); A  astigmatism; H hyperopia (farsighted); P presbyopia; Mrx spectacle prescription;  CTL contact lenses; OD right eye; OS left eye; OU both eyes  XT exotropia; ET esotropia; PEK punctate epithelial keratitis; PEE punctate epithelial erosions; DES dry eye syndrome; MGD meibomian gland dysfunction; ATs artificial tears; PFAT's preservative free artificial tears; Section nuclear sclerotic cataract; PSC posterior subcapsular  cataract; ERM epi-retinal membrane; PVD posterior vitreous detachment; RD retinal detachment; DM diabetes mellitus; DR diabetic retinopathy; NPDR non-proliferative diabetic retinopathy; PDR proliferative diabetic retinopathy; CSME clinically significant macular edema; DME diabetic macular edema; dbh dot blot hemorrhages; CWS cotton wool spot; POAG primary open angle glaucoma; C/D cup-to-disc ratio; HVF humphrey visual field; GVF goldmann visual field; OCT optical coherence tomography; IOP intraocular pressure; BRVO Branch retinal vein occlusion; CRVO central retinal vein occlusion; CRAO central retinal artery occlusion; BRAO branch retinal artery occlusion; RT retinal tear; SB scleral buckle; PPV pars plana vitrectomy; VH Vitreous hemorrhage; PRP panretinal laser photocoagulation; IVK intravitreal kenalog; VMT vitreomacular traction; MH Macular hole;  NVD neovascularization of the disc; NVE neovascularization elsewhere; AREDS age related eye disease study; ARMD age related macular degeneration; POAG primary open angle glaucoma; EBMD epithelial/anterior basement membrane dystrophy; ACIOL anterior chamber intraocular lens; IOL intraocular lens; PCIOL posterior chamber intraocular lens; Phaco/IOL phacoemulsification with intraocular lens placement; [place] photorefractive keratectomy; LASIK laser assisted in situ keratomileusis; HTN hypertension; DM diabetes mellitus; COPD chronic obstructive pulmonary disease

## 2021-11-10 NOTE — Assessment & Plan Note (Signed)
POS completed today to decrease vegF burden long-term

## 2021-11-12 ENCOUNTER — Other Ambulatory Visit: Payer: Medicare Other

## 2021-11-12 ENCOUNTER — Ambulatory Visit: Payer: Self-pay | Admitting: Nurse Practitioner

## 2021-11-18 NOTE — Telephone Encounter (Signed)
Advanced Heart Failure Patient Advocate Encounter  [facility] reviewed the provided income and the patient's grant is now listed as active. Should be able to use the grant with no issues now. Called the patient's sister and left updated message. Advised her to call back if there are any issues.  Charlann Boxer, CPhT

## 2021-11-22 DIAGNOSIS — F4323 Adjustment disorder with mixed anxiety and depressed mood: Secondary | ICD-10-CM | POA: Diagnosis not present

## 2021-11-24 ENCOUNTER — Ambulatory Visit (INDEPENDENT_AMBULATORY_CARE_PROVIDER_SITE_OTHER): Payer: Medicare Other | Admitting: Neurology

## 2021-11-24 ENCOUNTER — Encounter: Payer: Self-pay | Admitting: Neurology

## 2021-11-24 VITALS — BP 124/69 | HR 73 | Ht 74.0 in | Wt 207.0 lb

## 2021-11-24 DIAGNOSIS — I2510 Atherosclerotic heart disease of native coronary artery without angina pectoris: Secondary | ICD-10-CM | POA: Diagnosis not present

## 2021-11-24 DIAGNOSIS — E10319 Type 1 diabetes mellitus with unspecified diabetic retinopathy without macular edema: Secondary | ICD-10-CM | POA: Insufficient documentation

## 2021-11-24 DIAGNOSIS — I480 Paroxysmal atrial fibrillation: Secondary | ICD-10-CM | POA: Diagnosis not present

## 2021-11-24 DIAGNOSIS — I63431 Cerebral infarction due to embolism of right posterior cerebral artery: Secondary | ICD-10-CM

## 2021-11-24 DIAGNOSIS — H3500 Unspecified background retinopathy: Secondary | ICD-10-CM | POA: Insufficient documentation

## 2021-11-24 DIAGNOSIS — F5104 Psychophysiologic insomnia: Secondary | ICD-10-CM

## 2021-11-24 DIAGNOSIS — E11319 Type 2 diabetes mellitus with unspecified diabetic retinopathy without macular edema: Secondary | ICD-10-CM | POA: Insufficient documentation

## 2021-11-24 DIAGNOSIS — E103393 Type 1 diabetes mellitus with moderate nonproliferative diabetic retinopathy without macular edema, bilateral: Secondary | ICD-10-CM | POA: Diagnosis not present

## 2021-11-24 NOTE — Patient Instructions (Signed)
Insomnia Insomnia is a sleep disorder that makes it difficult to fall asleep or stay asleep. Insomnia can cause fatigue, low energy, difficulty concentrating, mood swings, and poor performance at work or school. There are three different ways to classify insomnia: Difficulty falling asleep. Difficulty staying asleep. Waking up too early in the morning. Any type of insomnia can be long-term (chronic) or short-term (acute). Both are common. Short-term insomnia usually lasts for 3 months or less. Chronic insomnia occurs at least three times a week for longer than 3 months. What are the causes? Insomnia may be caused by another condition, situation, or substance, such as: Having certain mental health conditions, such as anxiety and depression. Using caffeine, alcohol, tobacco, or drugs. Having gastrointestinal conditions, such as gastroesophageal reflux disease (GERD). Having certain medical conditions. These include: Asthma. Alzheimer's disease. Stroke. Chronic pain. An overactive thyroid gland (hyperthyroidism). Other sleep disorders, such as restless legs syndrome and sleep apnea. Menopause. Sometimes, the cause of insomnia may not be known. What increases the risk? Risk factors for insomnia include: Gender. Females are affected more often than males. Age. Insomnia is more common as people get older. Stress and certain medical and mental health conditions. Lack of exercise. Having an irregular work schedule. This may include working night shifts and traveling between different time zones. What are the signs or symptoms? If you have insomnia, the main symptom is having trouble falling asleep or having trouble staying asleep. This may lead to other symptoms, such as: Feeling tired or having low energy. Feeling nervous about going to sleep. Not feeling rested in the morning. Having trouble concentrating. Feeling irritable, anxious, or depressed. How is this diagnosed? This condition  may be diagnosed based on: Your symptoms and medical history. Your health care provider may ask about: Your sleep habits. Any medical conditions you have. Your mental health. A physical exam. How is this treated? Treatment for insomnia depends on the cause. Treatment may focus on treating an underlying condition that is causing the insomnia. Treatment may also include: Medicines to help you sleep. Counseling or therapy. Lifestyle adjustments to help you sleep better. Follow these instructions at home: Eating and drinking  Limit or avoid alcohol, caffeinated beverages, and products that contain nicotine and tobacco, especially close to bedtime. These can disrupt your sleep. Do not eat a large meal or eat spicy foods right before bedtime. This can lead to digestive discomfort that can make it hard for you to sleep. Sleep habits  Keep a sleep diary to help you and your health care provider figure out what could be causing your insomnia. Write down: When you sleep. When you wake up during the night. How well you sleep and how rested you feel the next day. Any side effects of medicines you are taking. What you eat and drink. Make your bedroom a dark, comfortable place where it is easy to fall asleep. Put up shades or blackout curtains to block light from outside. Use a white noise machine to block noise. Keep the temperature cool. Limit screen use before bedtime. This includes: Not watching TV. Not using your smartphone, tablet, or computer. Stick to a routine that includes going to bed and waking up at the same times every day and night. This can help you fall asleep faster. Consider making a quiet activity, such as reading, part of your nighttime routine. Try to avoid taking naps during the day so that you sleep better at night. Get out of bed if you are still awake after   15 minutes of trying to sleep. Keep the lights down, but try reading or doing a quiet activity. When you feel  sleepy, go back to bed. General instructions Take over-the-counter and prescription medicines only as told by your health care provider. Exercise regularly as told by your health care provider. However, avoid exercising in the hours right before bedtime. Use relaxation techniques to manage stress. Ask your health care provider to suggest some techniques that may work well for you. These may include: Breathing exercises. Routines to release muscle tension. Visualizing peaceful scenes. Make sure that you drive carefully. Do not drive if you feel very sleepy. Keep all follow-up visits. This is important. Contact a health care provider if: You are tired throughout the day. You have trouble in your daily routine due to sleepiness. You continue to have sleep problems, or your sleep problems get worse. Get help right away if: You have thoughts about hurting yourself or someone else. Get help right away if you feel like you may hurt yourself or others, or have thoughts about taking your own life. Go to your nearest emergency room or: Call 911. Call the [facility] at 317-250-6173 or 988. This is open 24 hours a day. Text the Crisis Text Line at 325-651-5387. Summary Insomnia is a sleep disorder that makes it difficult to fall asleep or stay asleep. Insomnia can be long-term (chronic) or short-term (acute). Treatment for insomnia depends on the cause. Treatment may focus on treating an underlying condition that is causing the insomnia. Keep a sleep diary to help you and your health care provider figure out what could be causing your insomnia. This information is not intended to replace advice given to you by your health care provider. Make sure you discuss any questions you have with your health care provider. Document Revised: 03/15/2021 Document Reviewed: 03/15/2021 Elsevier Patient Education  [place] for Sleep Apnea  Sleep apnea is a condition in  which breathing pauses or becomes shallow during sleep. Sleep apnea screening is a test to determine if you are at risk for sleep apnea. The test includes a series of questions. It will only takes a few minutes. Your health care provider may ask you to have this test in preparation for surgery or as part of a physical exam. What are the symptoms of sleep apnea? Common symptoms of sleep apnea include: Snoring. Waking up often at night. Daytime sleepiness. Pauses in breathing. Choking or gasping during sleep. Irritability. Forgetfulness. Trouble thinking clearly. Depression. Personality changes. Most people with sleep apnea do not know that they have it. What are the advantages of sleep apnea screening? Getting screened for sleep apnea can help: Ensure your safety. It is important for your health care providers to know whether or not you have sleep apnea, especially if you are having surgery or have other long-term (chronic) health conditions. Improve your health and allow you to get a better night's rest. Restful sleep can help you: Have more energy. Lose weight. Improve high blood pressure. Improve diabetes management. Prevent stroke. Prevent car accidents. What happens during the screening? Screening usually includes being asked a list of questions about your sleep quality. Some questions you may be asked include: Do you snore? Is your sleep restless? Do you have daytime sleepiness? Has a partner or spouse told you that you stop breathing during sleep? Have you had trouble concentrating or memory loss? What is your age? What is your neck circumference? To measure your neck, keep your  back straight and gently wrap the tape measure around your neck. Put the tape measure at the middle of your neck, between your chin and collarbone. What is your sex assigned at birth? Do you have or are you being treated for high blood pressure? If your screening test is positive, you are at risk for  the condition. Further testing may be needed to confirm a diagnosis of sleep apnea. Where to find more information You can find screening tools online or at your health care clinic. For more information about sleep apnea screening and healthy sleep, visit these websites: Centers for Disease Control and Prevention: http://www.wolf.info/ American Sleep Apnea Association: www.sleepapnea.org Contact a health care provider if: You think that you may have sleep apnea. Summary Sleep apnea screening can help determine if you are at risk for sleep apnea. It is important for your health care providers to know whether or not you have sleep apnea, especially if you are having surgery or have other chronic health conditions. You may be asked to take a screening test for sleep apnea in preparation for surgery or as part of a physical exam. This information is not intended to replace advice given to you by your health care provider. Make sure you discuss any questions you have with your health care provider. Document Revised: 03/13/2020 Document Reviewed: 03/13/2020 Elsevier Patient Education  Normal.

## 2021-11-24 NOTE — Progress Notes (Signed)
SLEEP MEDICINE CLINIC    Provider:  Larey Seat, MD  Primary Care Physician:  Dettinger, Fransisca Kaufmann, MD [place] [address]     Referring Provider: Frann Rider, [address],  [place] [address]   Primary Neurologist : Dr Antony Contras, MD          Chief Complaint according to patient   Patient presents with:     New Patient (Initial Visit)           HISTORY OF PRESENT ILLNESS:  Greg Robertson is a 71 y.o. year old White or Caucasian male stroke patient was seen upon referral on 11/24/2021 from Stroke, MD  for a sleep evaluation.  Chief concern according to patient :   w sister. Pt referred for sleep consult due to insomnia, daytime fatigue, hx stroke 2022, and afib. No prior ss. Pt reports some nights where he toss/turns unable to fall asleep. Currently at an assisted living facility will be living at home alone on 8/19.  He presents with an extensive history, DM type 1 , 1978- had colon cancer in 2013, recovered form surgery ,  developed DKA after oxycodone, and was hospitalized, and developed atrial fibrillation. 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  He  reports no daytime sleepiness, not more fatigue than in the years prior. But he has some difficulties to go to sleep, naps in daytime when he feels tired enough but then states : I can't fall asleep". He will sleep into the middle of the day when left to himself. He has experienced confusional arousals with  hypoglycemia.  Staying in assisted living he has been in a routine. He reports he could get by on 4-5 hours of sleep in his 30. Not now. He has been seeing a psychologist for chronic insomnia 20 years ago- this is long standing.   .    Sleep relevant medical history: difficulties to initiate seep. 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   and lives in assistant living- Tobacco use- he smokes marijuana- .  ETOH use ; rarely, but drank 3/ week  before he has the stoke,  Caffeine intake in form of Coffee( /) Soda( /) Tea ( 6 ounces, 2 a day) or energy drinks. Regular exercise in form of /.   Hobbies :/      Sleep habits are as follows: The patient's dinner time is between 4-5.30 PM.  The patient goes to bed at 11.30 PM and may continue to struggle to go to sleep-  continues to sleep for 4-7 hours, wakes for 1-2 bathroom breaks, the first time at 3 AM.   The preferred sleep position is supine with the support of 1-2 pillows.  Dreams are reportedly rare/ frequent/vivid.    8 AM is the usual breakfast  time. The patient wakes up with an alarm. He may go back to bed after breakfast ,  He reports not feeling refreshed or restored in AM, with symptoms such as residual fatigue. Naps are taken rarely.  Review of Systems: Out of a complete 14 system review, the patient complains of only the following symptoms, and all  other reviewed systems are negative.:  Fatigue, sleepiness , snoring, fragmented sleep, Insomnia ,    How likely are you to doze in the following situations: 0 = not likely, 1 = slight chance, 2 = moderate chance, 3 = high chance   Sitting and Reading? Watching Television? Sitting inactive in a public place (theater or meeting)? As a passenger in a car for an hour without a break? Lying down in the afternoon when circumstances permit? Sitting and talking to someone? Sitting quietly after lunch without alcohol? In a car, while stopped for a few minutes in traffic?   Total = 7/ 24 points   FSS endorsed at 17/ 63 points.     Social History   Socioeconomic History   Marital status: Single    Spouse name: Not on file   Number of children: 0   Years of  education: 16   Highest education level: Bachelor's degree (e.g., BA, AB, BS)  Occupational History   Occupation: Retired    Comment: Press photographer  Tobacco Use   Smoking status: Former    Packs/day: 1.00    Years: 15.00    Total pack years: 15.00    Types: Cigarettes    Quit date: 04/18/1993    Years since quitting: 28.6   Smokeless tobacco: Never   Tobacco comments:    marijuana every night   Vaping Use   Vaping Use: Never used  Substance and Sexual Activity   Alcohol use: Not Currently    Alcohol/week: 5.0 standard drinks of alcohol    Types: 3 Cans of beer, 2 Shots of liquor per week    Comment: 1 drink every 2 days   Drug use: Not Currently    Types: Marijuana    Comment: once a night marijuana   Sexual activity: Not Currently  Other Topics Concern   Not on file  Social History Narrative   Pt lives alone    R handed   Caffeine: no coffee/or soda. Has sweet tea for dinner.    Social Determinants of Health   Financial Resource Strain: Low Risk  (10/01/2020)   Overall Financial Resource Strain (CARDIA)    Difficulty of Paying Living Expenses: Not hard at all  Food Insecurity: No Food Insecurity (10/01/2020)   Hunger Vital Sign    Worried About Running Out of Food in the Last Year: Never true    Ran Out of Food in the Last Year: Never true  Transportation Needs: No Transportation Needs (10/01/2020)   PRAPARE - Hydrologist (Medical): No    Lack of Transportation (Non-Medical): No  Physical Activity: Sufficiently Active (11/15/2019)   Exercise Vital Sign    Days of Exercise per Week: 5 days    Minutes of Exercise per Session: 40 min  Stress: No Stress Concern Present (11/15/2019)   [place]    Feeling of Stress : Only a little  Social Connections: Moderately Isolated (11/15/2019)   Social Connection and Isolation Panel [NHANES]    Frequency of Communication with Friends and Family:  More than three times a week    Frequency of Social Gatherings with Friends and Family: Three times a week    Attends Religious Services: 1 to 4 times per year    Active Member of Clubs or Organizations: No    Attends Archivist Meetings: Never    Marital Status: Never married    Family History  Problem Relation Age  of Onset   Colon polyps Father    Lung cancer Father    Diabetes Father    Prostate cancer Father    Breast cancer Mother    Pancreatitis Mother        intestinal adhesions   Insomnia Mother    Colon cancer Neg Hx    Rectal cancer Neg Hx     Past Medical History:  Diagnosis Date   Allergic rhinitis    Anemia    Atrial fibrillation ([place])    Persistent   Cancer of ascending colon, 7cm 05/25/2011   Congestive heart failure (CHF) ([place])    Diabetes mellitus type I ([place])    Glaucoma    Hypertension    Pneumonia 1979 or 1980   Prostate nodule    Substance abuse ([place])     Past Surgical History:  Procedure Laterality Date   broken left shoulder blade and collar bone     COLONOSCOPY WITH PROPOFOL N/A 04/29/2021   Procedure: COLONOSCOPY WITH PROPOFOL;  Surgeon: Milus Banister, MD;  Location: [facility];  Service: Endoscopy;  Laterality: N/A;   ESOPHAGOGASTRODUODENOSCOPY (EGD) WITH PROPOFOL N/A 04/29/2021   Procedure: ESOPHAGOGASTRODUODENOSCOPY (EGD) WITH PROPOFOL;  Surgeon: Milus Banister, MD;  Location: [facility];  Service: Endoscopy;  Laterality: N/A;   FINGER SURGERY  2009   right middle   HEMOSTASIS CLIP PLACEMENT  04/29/2021   Procedure: HEMOSTASIS CLIP PLACEMENT;  Surgeon: Milus Banister, MD;  Location: [facility];  Service: Endoscopy;;   LAPAROSCOPIC ASSISTED 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     NASAL FRACTURE SURGERY  1968   POLYPECTOMY  04/29/2021   Procedure: POLYPECTOMY;  Surgeon: Milus Banister, MD;  Location: [facility];  Service: Endoscopy;;   PORTACATH PLACEMENT  07/01/2011   Procedure: INSERTION PORT-A-CATH;   Surgeon: Adin Hector, MD;  Location: [facility];  Service: General;  Laterality: Left;  Insertion of Port-A-Catheter Left Internal Jugular   RIGHT/LEFT HEART CATH AND CORONARY ANGIOGRAPHY N/A 12/08/2020   Procedure: RIGHT/LEFT HEART CATH AND CORONARY ANGIOGRAPHY;  Surgeon: Larey Dresser, MD;  Location: [facility];  Service: Cardiovascular;  Laterality: N/A;   TONSILLECTOMY  1957 - approximate     Current Outpatient Medications on File Prior to Visit  Medication Sig Dispense Refill   acetaminophen (TYLENOL) 500 MG tablet Take 1,000 mg by mouth every 6 (six) hours as needed for mild pain or headache. Pain     atorvastatin (LIPITOR) 40 MG tablet Take 1 tablet (40 mg total) by mouth daily. 30 tablet 3   carvedilol (COREG) 12.5 MG tablet Take 1.5 tablets (18.75 mg total) by mouth 2 (two) times daily with a meal. 270 tablet 3   Cholecalciferol 50 MCG (2000 UT) TABS Take 2 tablets by mouth daily.     Continuous Blood Gluc Sensor (FREESTYLE LIBRE 2 SENSOR) MISC 1 each by Does not apply route every 14 (fourteen) days 6 each 3   furosemide (LASIX) 20 MG tablet Take 20 mg by mouth daily at 2 PM. 20 mg q PM     furosemide (LASIX) 40 MG tablet Take 40 mg by mouth daily. 40 mg q am     glucose blood test strip Use as instructed 100 each 12   insulin aspart (NOVOLOG) 100 UNIT/ML injection Inject 17 Units into the skin 2 (two) times daily. 11 units with dinner     insulin degludec (TRESIBA FLEXTOUCH) 100 UNIT/ML FlexTouch Pen Inject 25 Units into the skin every morning.  Insulin Pen Needle 32G X 4 MM MISC Use as directed 100 each 0   levocetirizine (XYZAL) 5 MG tablet Take 1 tablet (5 mg total) by mouth daily. 90 tablet 3   Multiple Vitamin (MULTIVITAMIN WITH MINERALS) TABS tablet Take 1 tablet by mouth daily. (0800)     sacubitril-valsartan (ENTRESTO) 97-103 MG Take 1 tablet by mouth 2 (two) times daily. 180 tablet 3   sodium zirconium cyclosilicate (LOKELMA) 10 g PACK packet Take 10 g by mouth  daily. 90 packet 3   spironolactone (ALDACTONE) 25 MG tablet Take 0.5 tablets (12.5 mg total) by mouth daily. 45 tablet 3   triamcinolone (NASACORT) 55 MCG/ACT AERO nasal inhaler INSTILL 1 SPRAY IN EACH NOSTRIL ONCE OR TWICE DAILY AS NEEDED. 16.5 Bottle 11   vitamin C (ASCORBIC ACID) 500 MG tablet Take 500 mg by mouth in the morning. (0800)     XARELTO 20 MG TABS tablet TAKE ONE TABLET BY MOUTH EVERY EVENING WITH SUPPER 30 tablet 11   No current facility-administered medications on file prior to visit.    Allergies  Allergen Reactions   Oxycodone Nausea And Vomiting    Physical exam:  Today's Vitals   11/24/21 1444  BP: 124/69  Pulse: 73  Weight: 207 lb (93.9 kg)  Height: '6\' 2"'$  (1.88 m)   Body mass index is 26.58 kg/m.   Wt Readings from Last 3 Encounters:  11/24/21 207 lb (93.9 kg)  10/13/21 202 lb 9.6 oz (91.9 kg)  09/29/21 204 lb (92.5 kg)     Ht Readings from Last 3 Encounters:  11/24/21 '6\' 2"'$  (1.88 m)  10/13/21 '6\' 2"'$  (1.88 m)  09/29/21 '6\' 2"'$  (1.88 m)      General: The patient is awake, alert and appears not in acute distress. The patient is well groomed. Head: Normocephalic, atraumatic. Neck is supple. Mallampati 1-2,  neck circumference lower partial denture. :17 inches . Nasal airflow not fully patent.  Retrognathia is not  seen.  Dental status:  Cardiovascular:  Regular rate and cardiac rhythm by pulse,  without distended neck veins. Respiratory: Lungs are clear to auscultation.  Skin:  With evidence of ankle edema,-. Trunk: The patient's posture is erect.   Neurologic exam : The patient is awake and alert, oriented to place and time.   Memory subjective described as intact.  Attention span & concentration ability appears normal.  Speech is non fluent-  without cadence -  dysarthria, dysphonia  Mood and affect are delayed response,    Cranial nerves: no loss of smell or taste reported  Pupils are equal and briskly reactive to light. Funduscopic exam  deferred. .  Extraocular movements in vertical and horizontal planes were intact and without nystagmus. No Diplopia. Visual fields homonymous hemianopsia to the right  Hearing impaired.  Facial sensation intact to fine touch.  Facial motor strength is symmetric , left facial droop has recovered. and tongue and uvula move midline.  Neck ROM : rotation, tilt and flexion extension were normal for age and shoulder shrug was symmetrical.    Motor exam:  Symmetric bulk, tone and ROM.   Normal tone without cog wheeling, symmetric grip strength .   Sensory:  Fine touch, pinprick and vibration were absent at the ankle  Proprioception tested in the upper extremities was normal.   Coordination: Rapid alternating movements in the fingers/hands were of normal speed.  The Finger-to-nose maneuver was impaired on the left with evidence of dysmetria.   Gait and station: Patient could rise  unassisted from a seated position, walked without assistive device.  Deep tendon reflexes: in the  upper and lower extremities are absent. Babinski response was deferred .       After spending a total time of  45  minutes face to face and additional time for physical and neurologic examination, review of laboratory studies,  personal review of imaging studies, reports and results of other testing and review of referral information / records as far as provided in visit, I have established the following assessments:  1) the patient denies snoring, denies fatigue and denies sleepiness. He has been treated by psychology for chronic Insomnia. 2) atrial fib can go hand in hand with OSA , and treating apnea can help to stall with retro-orbital pressure, congestion..  3) all deficits from stroke have been stabile.    My Plan is to proceed with:  1)I will order a HST for this patient, who is severely vision impaired. 2)PSG would help to look for atrial fib, hypoxia, but his inability to initiate sleep is unrelated to a  physiological sleep disorder.     In short, RAPHAEL FITZPATRICK is presenting with a non organic sleep disorder of insomnia. , follow up through his established  NP within 3-4 months.   CC: I will share my notes with Dr Leonie Man, .  Electronically signed by: Larey Seat, MD 11/24/2021 3:03 PM  [facility] Board certified by The [employer] of Sleep Medicine and Diplomate of the [employer] of Sleep Medicine. Board certified In Neurology through the [place], Fellow of the [employer] of Neurology. Medical Director of [facility].

## 2021-11-29 ENCOUNTER — Telehealth: Payer: Self-pay | Admitting: Neurology

## 2021-11-29 NOTE — Telephone Encounter (Signed)
Patient has been r/s for 12/01/21 he is aware to arrive at 8 pm.

## 2021-11-29 NOTE — Telephone Encounter (Signed)
NPSG no auth needed (Pt would like earlier appt when available!)  Patient is scheduled at [facility] for 12/21/21 at 9 pm.  Mailed packet to the patient.

## 2021-12-01 ENCOUNTER — Ambulatory Visit (INDEPENDENT_AMBULATORY_CARE_PROVIDER_SITE_OTHER): Payer: Medicare Other | Admitting: Neurology

## 2021-12-01 DIAGNOSIS — I63431 Cerebral infarction due to embolism of right posterior cerebral artery: Secondary | ICD-10-CM

## 2021-12-01 DIAGNOSIS — G4733 Obstructive sleep apnea (adult) (pediatric): Secondary | ICD-10-CM | POA: Diagnosis not present

## 2021-12-01 DIAGNOSIS — F5104 Psychophysiologic insomnia: Secondary | ICD-10-CM

## 2021-12-01 DIAGNOSIS — I480 Paroxysmal atrial fibrillation: Secondary | ICD-10-CM

## 2021-12-01 DIAGNOSIS — E103393 Type 1 diabetes mellitus with moderate nonproliferative diabetic retinopathy without macular edema, bilateral: Secondary | ICD-10-CM

## 2021-12-07 ENCOUNTER — Telehealth: Payer: Self-pay | Admitting: Family Medicine

## 2021-12-07 NOTE — Telephone Encounter (Signed)
Left message to return call.  Can schedule in next available 42mand we can refill the medications until appt.  Per Dettinger's last note pt was being followed by rehab/assisted living and they are managing the pt at this time.  A Dr MAundra Dubinprescribes most of the pts medications.

## 2021-12-08 MED ORDER — INSULIN ASPART 100 UNIT/ML IJ SOLN: 17.0000 [IU] | Freq: Two times a day (BID) | INTRAMUSCULAR | 1 refills | Status: DC

## 2021-12-08 MED ORDER — LEVOCETIRIZINE DIHYDROCHLORIDE 5 MG PO TABS: 5.0000 mg | ORAL_TABLET | Freq: Every day | ORAL | 3 refills | Status: DC

## 2021-12-08 MED ORDER — ATORVASTATIN CALCIUM 40 MG PO TABS
40.0000 mg | ORAL_TABLET | Freq: Every day | ORAL | 3 refills | Status: DC
Start: 2021-12-08 — End: 2021-12-17

## 2021-12-08 MED ORDER — TRESIBA FLEXTOUCH 100 UNIT/ML ~~LOC~~ SOPN: 25.0000 [IU] | PEN_INJECTOR | SUBCUTANEOUS | 3 refills | Status: DC

## 2021-12-08 NOTE — Telephone Encounter (Signed)
Pt scheduled for 01/07/2022. Use [employer] in [place] fax [phone] atorvastatin (LIPITOR) 40 MG tablet insulin degludec (TRESIBA FLEXTOUCH) 100 UNIT/ML FlexTouch Pen insulin aspart (NOVOLOG) 100 UNIT/ML injection levocetirizine (XYZAL) 5 MG tablet triamcinolone (NASACORT) 55 MCG/ACT AERO nasal inhaler--OTC?

## 2021-12-17 ENCOUNTER — Encounter (HOSPITAL_COMMUNITY): Payer: Self-pay | Admitting: Cardiology

## 2021-12-17 ENCOUNTER — Ambulatory Visit (HOSPITAL_COMMUNITY)
Admission: RE | Admit: 2021-12-17 | Discharge: 2021-12-17 | Disposition: A | Discharge status: Home / Self Care | Payer: Medicare Other | Source: Ambulatory Visit | Attending: Cardiology | Admitting: Cardiology

## 2021-12-17 VITALS — BP 90/50 | HR 69 | Wt 196.8 lb

## 2021-12-17 DIAGNOSIS — I130 Hypertensive heart and chronic kidney disease with heart failure and stage 1 through stage 4 chronic kidney disease, or unspecified chronic kidney disease: Secondary | ICD-10-CM | POA: Insufficient documentation

## 2021-12-17 DIAGNOSIS — I428 Other cardiomyopathies: Secondary | ICD-10-CM | POA: Insufficient documentation

## 2021-12-17 DIAGNOSIS — E1022 Type 1 diabetes mellitus with diabetic chronic kidney disease: Secondary | ICD-10-CM | POA: Insufficient documentation

## 2021-12-17 DIAGNOSIS — I480 Paroxysmal atrial fibrillation: Secondary | ICD-10-CM | POA: Insufficient documentation

## 2021-12-17 DIAGNOSIS — E785 Hyperlipidemia, unspecified: Secondary | ICD-10-CM | POA: Diagnosis not present

## 2021-12-17 DIAGNOSIS — Z8673 Personal history of transient ischemic attack (TIA), and cerebral infarction without residual deficits: Secondary | ICD-10-CM | POA: Insufficient documentation

## 2021-12-17 DIAGNOSIS — I5042 Chronic combined systolic (congestive) and diastolic (congestive) heart failure: Secondary | ICD-10-CM | POA: Diagnosis not present

## 2021-12-17 DIAGNOSIS — Z7901 Long term (current) use of anticoagulants: Secondary | ICD-10-CM | POA: Diagnosis not present

## 2021-12-17 DIAGNOSIS — Z79899 Other long term (current) drug therapy: Secondary | ICD-10-CM | POA: Insufficient documentation

## 2021-12-17 DIAGNOSIS — I2510 Atherosclerotic heart disease of native coronary artery without angina pectoris: Secondary | ICD-10-CM | POA: Insufficient documentation

## 2021-12-17 DIAGNOSIS — I5022 Chronic systolic (congestive) heart failure: Secondary | ICD-10-CM | POA: Diagnosis not present

## 2021-12-17 DIAGNOSIS — Z794 Long term (current) use of insulin: Secondary | ICD-10-CM | POA: Diagnosis not present

## 2021-12-17 LAB — BRAIN NATRIURETIC PEPTIDE: B Natriuretic Peptide: 105.6 pg/mL — ABNORMAL HIGH (ref 0.0–100.0)

## 2021-12-17 LAB — CBC
HCT: 36.4 % — ABNORMAL LOW (ref 39.0–52.0)
Hemoglobin: 12.5 g/dL — ABNORMAL LOW (ref 13.0–17.0)
MCH: 30 pg (ref 26.0–34.0)
MCHC: 34.3 g/dL (ref 30.0–36.0)
MCV: 87.5 fL (ref 80.0–100.0)
Platelets: 164 10*3/uL (ref 150–400)
RBC: 4.16 MIL/uL — ABNORMAL LOW (ref 4.22–5.81)
RDW: 13 % (ref 11.5–15.5)
WBC: 7.2 10*3/uL (ref 4.0–10.5)
nRBC: 0 % (ref 0.0–0.2)

## 2021-12-17 LAB — BASIC METABOLIC PANEL
Anion gap: 7 (ref 5–15)
BUN: 26 mg/dL — ABNORMAL HIGH (ref 8–23)
CO2: 28 mmol/L (ref 22–32)
Calcium: 9.4 mg/dL (ref 8.9–10.3)
Chloride: 107 mmol/L (ref 98–111)
Creatinine, Ser: 1.45 mg/dL — ABNORMAL HIGH (ref 0.61–1.24)
GFR, Estimated: 52 mL/min — ABNORMAL LOW (ref >60)
Glucose, Bld: 80 mg/dL (ref 70–99)
Potassium: 4.4 mmol/L (ref 3.5–5.1)
Sodium: 142 mmol/L (ref 135–145)

## 2021-12-17 MED ORDER — ATORVASTATIN CALCIUM 40 MG PO TABS: 40.0000 mg | ORAL_TABLET | Freq: Every day | ORAL | 3 refills | Status: DC

## 2021-12-17 MED ORDER — SPIRONOLACTONE 25 MG PO TABS: 12.5000 mg | ORAL_TABLET | Freq: Every day | ORAL | 3 refills | Status: DC

## 2021-12-17 MED ORDER — FUROSEMIDE 40 MG PO TABS: 40.0000 mg | ORAL_TABLET | Freq: Every day | ORAL | 3 refills | Status: DC

## 2021-12-17 MED ORDER — ENTRESTO 97-103 MG PO TABS
1.0000 | ORAL_TABLET | Freq: Two times a day (BID) | ORAL | 3 refills | Status: DC
Start: 2021-12-17 — End: 2022-10-04

## 2021-12-17 MED ORDER — RIVAROXABAN 20 MG PO TABS: ORAL_TABLET | ORAL | 11 refills | Status: DC

## 2021-12-17 MED ORDER — CARVEDILOL 12.5 MG PO TABS: 18.7500 mg | ORAL_TABLET | Freq: Two times a day (BID) | ORAL | 3 refills | Status: DC

## 2021-12-17 MED ORDER — LOKELMA 10 G PO PACK: 10.0000 g | PACK | Freq: Every day | ORAL | 3 refills | Status: DC

## 2021-12-17 NOTE — Progress Notes (Signed)
  79 apneas (70 obstructive; 5 central; 4 mixed), and 26 hypopneas.  Apnea index was 19.9/h. and Hypopnea index was 6.6/h. The apnea-hypopnea index (AHI) was 26.5/h overall . The AHI was 26.4/h in supine, 18/h in non-supine;  IMPRESSION: 1) Sleep disordered breathing was present. Mostly Obstructive Sleep Apnea (OSA) was seen at a moderate degree, associated withclinically insignificantoxygen desaturation, mild snoring. 2) Periodic limb movements were present but not cause of arousals.  3) Total sleep time was reducedat 238.20mnutes. Sleep efficiency was decreasedat 50.9%.Sleep fragmentation was noted- The majority of sleep arousals was related to spontaneous arousals, followed by respiratory arousals.   RECOMMENDATIONS: 1) Positive airway pressure titration study is recommended and was ordered by the interpreting physician. 2) Insomnia is a chronic condition for this patient, presentfor decadesand not likely influenced by apnea treatment. The patient is fully aware of the need for Sleep hygiene changes such as the routine of bedtime, rise time and avoiding daytime naps through activities to stimulate the patient in daytime.  3) I recommend to his primary Neurologist to obtain a designated EEG study for this patient.  Lynzie Cliburn, MD Medical Director of [facility]

## 2021-12-17 NOTE — Patient Instructions (Signed)
There has been no changes to your medications..  Labs done today, your results will be available in MyChart, we will contact you for abnormal readings.  Your physician recommends that you schedule a follow-up appointment in: 3 months   If you have any questions or concerns before your next appointment please send us a message through mychart or call our office at [phone].    TO LEAVE A MESSAGE FOR THE NURSE SELECT OPTION 2, PLEASE LEAVE A MESSAGE INCLUDING: YOUR NAME DATE OF BIRTH CALL BACK NUMBER REASON FOR CALL**this is important as we prioritize the call backs  YOU WILL RECEIVE A CALL BACK THE SAME DAY AS LONG AS YOU CALL BEFORE 4:00 PM  At the [facility], you and your health needs are our priority. As part of our continuing mission to provide you with exceptional heart care, we have created designated Provider Care Teams. These Care Teams include your primary Cardiologist (physician) and Advanced Practice Providers (APPs- Physician Assistants and Nurse Practitioners) who all work together to provide you with the care you need, when you need it.   You may see any of the following providers on your designated Care Team at your next follow up: Dr Daniel Bensimhon Dr Dalton McLean Dr. Aditya Sabharwal Amy Clegg, NP Brittainy Simmons, PA Jessica Milford,NP Lindsay Finch, PA Alma Diaz, NP Lauren Kemp, PharmD   Please be sure to bring in all your medications bottles to every appointment.    

## 2021-12-17 NOTE — Addendum Note (Signed)
Addended by: Larey Seat on: 12/17/2021 11:44 AM   Modules accepted: Orders

## 2021-12-17 NOTE — Procedures (Signed)
[facility]  POLYSOMNOGRAPHY  INTERPRETATION REPORT   STUDY DATE:  12/01/2021     PATIENT NAME:    Greg Heir Walrond "Dixon"   DATE OF BIRTH:  04-24-1950  PATIENT ID:  [id number]    TYPE OF STUDY:  PSG  READING PHYSICIAN: Larey Seat, MD REFERRED BY: Frann Rider, NP- following for Stroke, MD  PSG TECHNICIAN: Gaylyn Cheers, RPSGT   HISTORY:  This 71 year-old Male patient was seen on 11-24-2021 . Past medical history  :  Currently at an assisted living facility, he will be living at home alone after 12/04/21. He presents with an extensive history, DM type 1 , diagnosed in 88- when he had colon cancer in 2013, and while he recovered from surgery, he developed DKA after oxycodone, and developed atrial fibrillation. 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  He reports no daytime sleepiness, not more fatigue than in the years prior. But he has some difficulties to go to sleep, naps in daytime when he feels tired enough but then states : I can't fall asleep", yet he will sleep into the middle of the day when left to himself. He has experienced confusional arousals with ?hypoglycemia. For now ,while staying in assisted living, he has been forced into a bedtime routine. He reports he could get by on 4-5 hours of sleep in his 30. Not now. He has been seeing a psychologist for chronic insomnia 20 years ago- this is long standing. He smoked , consumes marihuana. He no longer drinks alcohol.  ADDITIONAL INFORMATION:  The Epworth Sleepiness Scale endorsed at at  7 /24 points (scores above or equal to 10 are suggestive of hypersomnolence). FSS endorsed at 17 /63 points.  Height: 74 in Weight: 207 lb (BMI 26) Neck Size: 17 in  MEDICATIONS: Tylenol, Lipitor, Coreg,  Cholecalciferol, Lasix, Novolog, Triseba Flex-touch, Xyzal, Multivitamin, Entresto, Lokelma, Aldactone, Nasacort, Ascorbic acid, Xarelto TECHNICAL DESCRIPTION: A registered sleep technologist ( RPSGT)  was in attendance for the duration of the recording.  Data collection, scoring, video monitoring, and reporting were performed in compliance with the AASM Manual for the Scoring of Sleep and Associated Events; (Hypopnea is scored based on the criteria listed in Section VIII D. 1b in the AASM Manual V2.6 using a 4% oxygen desaturation rule or Hypopnea is scored based on the criteria listed in Section VIII D. 1a in the AASM Manual V2.6 using 3% oxygen desaturation and /or arousal rule).   SLEEP CONTINUITY AND SLEEP ARCHITECTURE:  Lights-out was at 21:37: and lights-on at  05:24:, with  7.8 minutes of recording time . Total sleep time ( TST) was 238.0 minutes with a decreased sleep efficiency at 50.9%. There were 10.3% REM sleep.  Total supine REM sleep time was 04 minutes (16% of total REM sleep).  Sleep latency was 6.5 minutes.  REM sleep latency was increased at 207.5 minutes. Of the total sleep time (TST) , the percentage of stage N1 sleep was 23.1%, stage N2 sleep was 64%, stage N3 sleep was 2.1%, and REM sleep was 10.3%.  There were 3 Stage R periods observed on this study night, 76 awakenings (i.e. transitions to Stage W from any sleep stage), and 220 total stage transitions. Wake after sleep onset (WASO) time accounted for 162.5 m .  BODY POSITION:  TST subdivided  between the following sleep positions as follows: supine 134 minutes (56%), non-supine 104 minutes (44%); right 90 minutes (38%), left 14 minutes (6%), and prone 00 minutes (0%).  RESPIRATORY MONITORING:  Based on CMS criteria (using a 4% oxygen desaturation rule for scoring hypopneas), there were 79 apneas (70 obstructive; 5 central; 4 mixed), and 26 hypopneas.  Apnea index was 19.9/h. and Hypopnea index was 6.6/h. The apnea-hypopnea index (AHI)  was 26.5/h overall . The AHI was 26.4/h in supine, 18/h in non-supine; 19.6/h in REM, 30.0/h in supine REM).  There were some additional respiratory effort-related arousals (RERAs) recorded.   OXIMETRY: The Oxyhemoglobin Saturation Nadir during sleep was at  85%  from a mean saturation of 96%.  Of the Total sleep time (TST),  hypoxemia (=<88%) was present for  0.9 minutes, or 0.4% of total sleep time.   LIMB MOVEMENTS: There were 9 periodic limb movements of sleep (2.3/hr), of which 0 (0.0/hr) were associated with an arousal. AROUSAL: There were 119 arousals in total, for an arousal index of 30/h.  Of these, 52 were identified as respiratory-related arousals (13 /h), 0 were PLM-related arousals (0 /h), and 107 were non-specific arousals (27 /h). There were 0 occurrences of Cheyne Stokes breathing.  Snoring was classified as mild. EEG:  PSG EEG was of normal amplitude with intermittent 2-3 second duration of low frequency ( 3-4 hz) , high Amplitude activity, with otherwise symmetric manifestation of sleep stage specific activity. EKG: The electrocardiogram documented  NSR.  The average heart rate during sleep was 62 bpm.  The heart rate during sleep varied between a minimum of Tachycardia and  a maximum of  114 bpm. AUDIO and VIDEO:  There were no rhythmic or complex movements in sleep captured, nor vocalizations.   IMPRESSION: 1) Sleep disordered breathing was present.  Obstructive Sleep apnea was seen at a moderate degree, associated with clinically insignificant oxygen desaturation,  mild snoring.   2) Periodic limb movements were present but not cause of arousals.  3) Total sleep time was reduced at 238.0 minutes.  Sleep efficiency was decreased at 50.9%.   Sleep fragmentation was noted- The majority of sleep arousals was related to spontaneous arousals, followed by respiratory arousals.    RECOMMENDATIONS: 1) Positive airway pressure titration study is recommended and was ordered by the  interpreting physician.  2) Insomnia is a chronic condition present  for decades and not likely influenced by apnea treatment. The patient is fully aware of the need for Sleep hygiene changes such as the routine of bedtime, rise time and avoiding daytime naps through activities to stimulate the patient in daytime.  3) I recommend to his primary Neurologist to obtain a designated EEG study for this patient.   Larey Seat,  MD Medical Director of [facility]

## 2021-12-19 NOTE — Progress Notes (Signed)
Patient ID: Greg Robertson, male   DOB: 08/29/1950, 71 y.o.   MRN: [id number] PCP: Dettinger, Fransisca Kaufmann, MD Cardiology: Dr. Aundra Dubin  71 y.o. with history of HTN, type I diabetes, and paroxysmal atrial fibrillation/CVA was referred by Dr. Shan Levans for evaluation of cardiomyopathy.  Patient also has had colon cancer with ileocolectomy in 2/13.  In 11/13, he fell and broke his clavicle (tripped).  Soon after this, he was admitted with DKA and profound dehydration/metabolic derangement.  He was noted to be in atrial fibrillation with RVR.  His cardiac enzymes were elevated, troponin peaked at 1.57. He initially went back into NSR but then had recurrent episodes of atrial fibrillation.  He was also started on Eliquis due to risk of embolic CVA, however he subsequently stopped Eliquis.   He was admitted in 6/22 with right PCA CVA and hemorrhagic conversion.  Initially just started on ASA, later this was stopped and Xarelto started.  Main residual from CVA is mild left upper visual field impairment.   CTA neck this admission showed < 50% bilateral carotid stenosis.  Echo showed EF 30-35%, diffuse hypokinesis.  RHC/LHC in 8/22 showed nonobstructive CAD and normal filling pressures.   Cardiac MRI in 11/22 showed LV EF 38%, RVEF 46%, basal inferolateral mid-wall LGE. Diffuse LGE images.  ECV 37-46%.  Based on cMRI, differential included prior myocarditis and cardiac amyloidosis.  PYP scan was done in 2/23, showing grade 1-2, H/CL 1.25.  Invitae gene testing for TTR mutations was negative. Based on full picture, I suspect TTR amyloidosis is unlikely.   Patient returns for followup of CHF.  He is now home from assisted living.  No exertional dyspnea, no problems walking up stairs.  Occasional lightheadedness if he stands too fast.  No chest pain.  He has joined a gym.  No orthopnea/PND.  Weight is down 7 lbs.   ECG: NSR, lateral TWIs (personally reviewed)  Labs (12/13): TSH normal, HCT 39.1, LFTs normal, K 4.2,  creatinine 1.1 Labs (7/22): K 5.6, creatinine 1.17 Labs (8/22): BNP 283, LDL 67, HDL 58, K 5.3, creatinine 1.34 Labs (10/22): K 5.1, creatinine 1.24 Labs (11/22): urine immunofixation negative, K 4.9, creatinine 1.37 Labs (2/23): K 4.1, creatinine 1.33, myeloma panel negative, LDL 68, TGs 58, hgb 12.8 Labs (5/23): K 4.8, creatinine 1.73, BNP 128  PMH: 1. HTN 2. Type I diabetes 3. Anemia, thrombocytopenia, leukopenia: related to chemotherapy.  4. Clavicular fracture 11/13 due to mechanical fall.  5. 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  FOLFOX chemotherapy 3/13 - 9/13.  7. Allergic rhinitis 8. Paroxysmal atrial fibrillation: First diagnosed in 11/13 during admission for DKA. Echo (11/13): EF 55% with grade I diastolic dysfunction.  9. Right central retinal artery occlusion 10. 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  - CTA neck with <50% carotid stenosis.  11. Cardiomyopathy: Nonischemic cardiomyopathy.  Echo in 6/22 with EF 30-35%, global hypokinesis, normal RV.   - RHC/LHC (8/22): 40% ostial/proximal LAD, D1 50%.  Mean RA 2, PA 29/9, mean PCWP 8, CI 2.94.  - Cardiac MRI (11/22): LV EF 38%, RVEF 46%, basal inferolateral mid-wall LGE. - PYP (2/23) showing grade 1-2, H/CL 1.25.  The PYP scan was equivocal.  12. COVID-19 2/23 13. OSA 14. CKD stage 3: Diabetic nephropathy  SH: Lives in [place].  Quit smoking in 1995.  H/o marijuana.  Quit drinking ETOH in 2022.    FH: No atrial fibrillation or CAD.   ROS: All systems reviewed and negative  except as per HPI.   Current Outpatient Medications  Medication Sig Dispense Refill   acetaminophen (TYLENOL) 500 MG tablet Take 1,000 mg by mouth every 6 (six) hours as needed for mild pain or headache. Pain     Cholecalciferol 50 MCG (2000 UT) TABS Take 2 tablets by mouth daily.     Continuous Blood Gluc Sensor (FREESTYLE LIBRE 2 SENSOR) MISC 1 each by Does not apply route every 14 (fourteen) days 6 each 3    glucose blood test strip Use as instructed 100 each 12   insulin aspart (NOVOLOG) 100 UNIT/ML injection Inject 10 Units into the skin 3 (three) times daily with meals. As per sliding scale based on carbs     Insulin Pen Needle 32G X 4 MM MISC Use as directed 100 each 0   levocetirizine (XYZAL) 5 MG tablet Take 1 tablet (5 mg total) by mouth daily. 90 tablet 3   Multiple Vitamin (MULTIVITAMIN WITH MINERALS) TABS tablet Take 1 tablet by mouth daily. (0800)     triamcinolone (NASACORT) 55 MCG/ACT AERO nasal inhaler INSTILL 1 SPRAY IN EACH NOSTRIL ONCE OR TWICE DAILY AS NEEDED. 16.5 Bottle 11   vitamin C (ASCORBIC ACID) 500 MG tablet Take 500 mg by mouth in the morning. (0800)     atorvastatin (LIPITOR) 40 MG tablet Take 1 tablet (40 mg total) by mouth daily. 30 tablet 3   carvedilol (COREG) 12.5 MG tablet Take 1.5 tablets (18.75 mg total) by mouth 2 (two) times daily with a meal. 270 tablet 3   furosemide (LASIX) 40 MG tablet Take 1 tablet (40 mg total) by mouth daily in the afternoon. 90 tablet 3   rivaroxaban (XARELTO) 20 MG TABS tablet TAKE ONE TABLET BY MOUTH EVERY EVENING WITH SUPPER 30 tablet 11   sacubitril-valsartan (ENTRESTO) 97-103 MG Take 1 tablet by mouth 2 (two) times daily. 180 tablet 3   sodium zirconium cyclosilicate (LOKELMA) 10 g PACK packet Take 10 g by mouth daily. 90 packet 3   spironolactone (ALDACTONE) 25 MG tablet Take 0.5 tablets (12.5 mg total) by mouth daily. 45 tablet 3   No current facility-administered medications for this encounter.    BP (!) 90/50   Pulse 69   Wt 89.3 kg (196 lb 12.8 oz)   SpO2 97%   BMI 25.27 kg/m  General: NAD Neck: No JVD, no thyromegaly or thyroid nodule.  Lungs: Clear to auscultation bilaterally with normal respiratory effort. CV: Nondisplaced PMI.  Heart regular S1/S2, no S3/S4, no murmur.  No peripheral edema.  No carotid bruit.  Normal pedal pulses.  Abdomen: Soft, nontender, no hepatosplenomegaly, no distention.  Skin: Intact without  lesions or rashes.  Neurologic: Alert and oriented x 3.  Psych: Normal affect. Extremities: No clubbing or cyanosis.  HEENT: Normal.   Assessment/Plan: 1. Atrial fibrillation:  Paroxysmal. No recent symptoms.  He is in NSR today.  - Continue Xarelto 20 mg daily.  CBC today.  - If atrial fibrillation recurs frequently, could use anti-arrhythmic versus ablation.  2. Chronic systolic CHF: Nonischemic cardiomyopathy.  Echo in 6/22 at time of CVA showed EF 30-35%, diffuse hypokinesis.  LHC/RHC in 8/22 showed nonobstructive CAD and normal filling pressures.  Cardiac MRI in 11/22 showed LV EF 38%, RVEF 46%, basal inferolateral mid-wall LGE.  Possible prior viral myocarditis based on MRI, cannot rule out cardiac amyloidosis.  PYP scan was equivocal but likely negative, Invitae gene testing negative for transthyretin mutation, myeloma panel was negative => suspect TTR cardiac amyloidosis  is unlikely.  NYHA class I-II symptoms (stable), not overloaded on exam.  - Continue Coreg 18.75 mg bid. He does not have BP room to increase.  - Continue spironolactone 12.5 mg daily.  He is on Lokelma to control K.  BMET today.  - Continue Entresto 97/103 bid.  - No SGLT2 inhibitor with type 1 diabetes.  - Continue Lasix 40 mg daily.  - EF out of range for ICD on 11/22 cMRI.  Narrow QRS so not CRT candidate.  3. Hyperlipidemia: Continue atorvastatin, good lipids in 2/23.  4. CAD: Nonobstructive on cath 8/22.  - Continue statin.  - No ASA given Xarelto use.   Followup 3 months with APP.   Loralie Champagne 12/19/2021

## 2021-12-22 ENCOUNTER — Telehealth: Payer: Self-pay | Admitting: Family Medicine

## 2021-12-22 ENCOUNTER — Telehealth: Payer: Self-pay | Admitting: Neurology

## 2021-12-22 MED ORDER — LANCETS MISC: 1.0000 | Freq: Three times a day (TID) | 3 refills | Status: AC

## 2021-12-22 MED ORDER — INSULIN PEN NEEDLE 32G X 4 MM MISC: 0 refills | Status: DC

## 2021-12-22 NOTE — Telephone Encounter (Signed)
Pt is now home and was discharged recently nursing home on 12/04/2021. New rx for BD auto shield for insulin pen.  New rx for lancets  Use [facility] in [place]. Pt has an apt 01/07/2022 with Dettinger

## 2021-12-22 NOTE — Telephone Encounter (Signed)
Refills sent to [facility].

## 2021-12-22 NOTE — Telephone Encounter (Signed)
-----   Message from Larey Seat, MD sent at 12/17/2021 11:44 AM EDT -----  79 apneas (70 obstructive; 5 central; 4 mixed), and 26 hypopneas.  Apnea index was 19.9/h. and Hypopnea index was 6.6/h. The apnea-hypopnea index (AHI) was 26.5/h overall . The AHI was 26.4/h in supine, 18/h in non-supine;  IMPRESSION: 1) Sleep disordered breathing was present. Mostly Obstructive Sleep Apnea (OSA) was seen at a moderate degree, associated withclinically insignificantoxygen desaturation, mild snoring. 2) Periodic limb movements were present but not cause of arousals.  3) Total sleep time was reducedat 238.93mnutes. Sleep efficiency was decreasedat 50.9%.Sleep fragmentation was noted- The majority of sleep arousals was related to spontaneous arousals, followed by respiratory arousals.   RECOMMENDATIONS: 1) Positive airway pressure titration study is recommended and was ordered by the interpreting physician. 2) Insomnia is a chronic condition for this patient, presentfor decadesand not likely influenced by apnea treatment. The patient is fully aware of the need for Sleep hygiene changes such as the routine of bedtime, rise time and avoiding daytime naps through activities to stimulate the patient in daytime.  3) I recommend to his primary Neurologist to obtain a designated EEG study for this patient.  CarmenDohmeier, MD Medical Director of [facility]

## 2021-12-22 NOTE — Telephone Encounter (Signed)
Called the pt and reviewed the sleep study in detail with him. The pt doesn't believe that this is accurate. I advised of the findings and answered the questions that he had. Pt would like a copy of the sleep study. Advised Dr Dohmeier recommends that he comes in for a titration study. At this point we can further educate and he can get an idea on how the machine works. Confirmed the address on file and will send the study highlighting the information we discussed. Pt verbalized understanding. Advised we will work on [employer] for the titration study and they will call and get him scheduled at which point he can decide whether to move forward. Patient was appreciative.

## 2022-01-03 NOTE — Telephone Encounter (Signed)
LVM for pt to call back to schedule sleep study.  

## 2022-01-07 ENCOUNTER — Ambulatory Visit (INDEPENDENT_AMBULATORY_CARE_PROVIDER_SITE_OTHER): Payer: Medicare Other | Admitting: Family Medicine

## 2022-01-07 ENCOUNTER — Encounter: Payer: Self-pay | Admitting: Family Medicine

## 2022-01-07 VITALS — BP 108/62 | HR 68 | Temp 97.2°F | Ht 74.0 in | Wt 194.0 lb

## 2022-01-07 DIAGNOSIS — E103393 Type 1 diabetes mellitus with moderate nonproliferative diabetic retinopathy without macular edema, bilateral: Secondary | ICD-10-CM | POA: Diagnosis not present

## 2022-01-07 DIAGNOSIS — E1069 Type 1 diabetes mellitus with other specified complication: Secondary | ICD-10-CM

## 2022-01-07 DIAGNOSIS — E1159 Type 2 diabetes mellitus with other circulatory complications: Secondary | ICD-10-CM

## 2022-01-07 DIAGNOSIS — Z23 Encounter for immunization: Secondary | ICD-10-CM | POA: Diagnosis not present

## 2022-01-07 DIAGNOSIS — I2510 Atherosclerotic heart disease of native coronary artery without angina pectoris: Secondary | ICD-10-CM

## 2022-01-07 DIAGNOSIS — I152 Hypertension secondary to endocrine disorders: Secondary | ICD-10-CM | POA: Diagnosis not present

## 2022-01-07 DIAGNOSIS — E785 Hyperlipidemia, unspecified: Secondary | ICD-10-CM | POA: Diagnosis not present

## 2022-01-07 LAB — BAYER DCA HB A1C WAIVED: HB A1C (BAYER DCA - WAIVED): 8.8 % — ABNORMAL HIGH (ref 4.8–5.6)

## 2022-01-07 NOTE — Progress Notes (Signed)
BP 108/62   Pulse 68   Temp (!) 97.2 F (36.2 C)   Ht 6' 2"  (1.88 m)   Wt 194 lb (88 kg)   SpO2 99%   BMI 24.91 kg/m    Subjective:   Patient ID: Greg Robertson, male    DOB: Dec 03, 1950, 71 y.o.   MRN: [id number]  HPI: Greg Robertson is a 71 y.o. male presenting on 01/07/2022 for Medical Management of Chronic Issues, Hyperlipidemia, Hypertension (BP has been running low at home), and Diabetes   HPI Type 1 diabetes mellitus Patient comes in today for recheck of his diabetes. Patient has been currently taking [place] and NovoLog. Patient is not currently on an ACE inhibitor/ARB. Patient has not seen an ophthalmologist this year. Patient denies any issues with their feet. The symptom started onset as an adult hypertension and CHF and A-fib-ARE RELATED TO DM   Hypertension and CHF and A-fib Patient is currently on carvedilol and furosemide and spironolactone, and their blood pressure today is Entresto and spironolactone and furosemide and carvedilol. Patient denies any lightheadedness or dizziness. Patient denies headaches, blurred vision, chest pains, shortness of breath, or weakness. Denies any side effects from medication and is content with current medication.   Hyperlipidemia Patient is coming in for recheck of his hyperlipidemia. The patient is currently taking Lipitor. They deny any issues with myalgias or history of liver damage from it. They deny any focal numbness or weakness or chest pain.  Relevant past medical, surgical, family and social history reviewed and updated as indicated. Interim medical history since our last visit reviewed. Allergies and medications reviewed and updated.  Review of Systems  Constitutional:  Negative for chills and fever.  Eyes:  Negative for visual disturbance.  Respiratory:  Negative for shortness of breath and wheezing.   Cardiovascular:  Negative for chest pain and leg swelling.  Musculoskeletal:  Negative for back pain and gait  problem.  Skin:  Negative for rash.  Neurological:  Negative for dizziness, weakness and light-headedness.  All other systems reviewed and are negative.   Per HPI unless specifically indicated above   Allergies as of 01/07/2022       Reactions   Oxycodone Nausea And Vomiting        Medication List        Accurate as of January 07, 2022 11:38 AM. If you have any questions, ask your nurse or doctor.          acetaminophen 500 MG tablet Commonly known as: TYLENOL Take 1,000 mg by mouth every 6 (six) hours as needed for mild pain or headache. Pain   ascorbic acid 500 MG tablet Commonly known as: VITAMIN C Take 500 mg by mouth in the morning. (0800)   atorvastatin 40 MG tablet Commonly known as: LIPITOR Take 1 tablet (40 mg total) by mouth daily.   carvedilol 12.5 MG tablet Commonly known as: COREG Take 1.5 tablets (18.75 mg total) by mouth 2 (two) times daily with a meal.   Cholecalciferol 50 MCG (2000 UT) Tabs Take 2 tablets by mouth daily.   Entresto 97-103 MG Generic drug: sacubitril-valsartan Take 1 tablet by mouth 2 (two) times daily.   FreeStyle Libre 2 Sensor Misc 1 each by Does not apply route every 14 (fourteen) days   furosemide 40 MG tablet Commonly known as: LASIX Take 1 tablet (40 mg total) by mouth daily in the afternoon.   glucose blood test strip Use as instructed   insulin aspart 100  UNIT/ML injection Commonly known as: novoLOG Inject 10 Units into the skin 3 (three) times daily with meals. As per sliding scale based on carbs   insulin degludec 100 UNIT/ML FlexTouch Pen Commonly known as: TRESIBA Inject 16-20 Units into the skin daily.   Insulin Pen Needle 32G X 4 MM Misc Use as directed   Lancets Misc 1 Lancet by Does not apply route 3 (three) times daily.   levocetirizine 5 MG tablet Commonly known as: XYZAL Take 1 tablet (5 mg total) by mouth daily.   Lokelma 10 g Pack packet Generic drug: sodium zirconium  cyclosilicate Take 10 g by mouth daily.   multivitamin with minerals Tabs tablet Take 1 tablet by mouth daily. (0800)   rivaroxaban 20 MG Tabs tablet Commonly known as: Xarelto TAKE ONE TABLET BY MOUTH EVERY EVENING WITH SUPPER   spironolactone 25 MG tablet Commonly known as: ALDACTONE Take 0.5 tablets (12.5 mg total) by mouth daily.   triamcinolone 55 MCG/ACT Aero nasal inhaler Commonly known as: NASACORT INSTILL 1 SPRAY IN EACH NOSTRIL ONCE OR TWICE DAILY AS NEEDED.         Objective:   BP 108/62   Pulse 68   Temp (!) 97.2 F (36.2 C)   Ht 6' 2"  (1.88 m)   Wt 194 lb (88 kg)   SpO2 99%   BMI 24.91 kg/m   Wt Readings from Last 3 Encounters:  01/07/22 194 lb (88 kg)  12/17/21 196 lb 12.8 oz (89.3 kg)  11/24/21 207 lb (93.9 kg)    Physical Exam Vitals and nursing note reviewed.  Constitutional:      General: He is not in acute distress.    Appearance: He is well-developed. He is not diaphoretic.  Eyes:     General: No scleral icterus.    Conjunctiva/sclera: Conjunctivae normal.  Neck:     Thyroid: No thyromegaly.  Cardiovascular:     Rate and Rhythm: Normal rate and regular rhythm.     Heart sounds: Normal heart sounds. No murmur heard. Pulmonary:     Effort: Pulmonary effort is normal. No respiratory distress.     Breath sounds: Normal breath sounds. No wheezing.  Musculoskeletal:        General: No swelling. Normal range of motion.     Cervical back: Neck supple.  Lymphadenopathy:     Cervical: No cervical adenopathy.  Skin:    General: Skin is warm and dry.     Findings: No rash.  Neurological:     Mental Status: He is alert and oriented to person, place, and time.     Coordination: Coordination normal.  Psychiatric:        Behavior: Behavior normal.       Assessment & Plan:   Problem List Items Addressed This Visit       Cardiovascular and Mediastinum   Hypertension associated with diabetes ([place]) (Chronic)   Relevant Medications    insulin degludec (TRESIBA) 100 UNIT/ML FlexTouch Pen   Other Relevant Orders   CBC with Differential/Platelet   CMP14+EGFR   Lipid panel   Bayer DCA Hb A1c Waived   AMB Referral to Chronic Care Management Services     Endocrine   DM type 1 (diabetes mellitus, type 1) (HCC) - Primary (Chronic)   Relevant Medications   insulin degludec (TRESIBA) 100 UNIT/ML FlexTouch Pen   Other Relevant Orders   CBC with Differential/Platelet   CMP14+EGFR   Lipid panel   Bayer DCA Hb A1c Waived  Microalbumin / creatinine urine ratio   AMB Referral to Chronic Care Management Services   Hyperlipidemia due to type 1 diabetes mellitus (HCC)   Relevant Medications   insulin degludec (TRESIBA) 100 UNIT/ML FlexTouch Pen   Other Relevant Orders   CBC with Differential/Platelet   CMP14+EGFR   Lipid panel   Bayer DCA Hb A1c Waived   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  He asked for pharmacist referral because he thinks his Elenor Legato system is not working. Follow up plan: Return in about 3 months (around 04/08/2022), or if symptoms worsen or fail to improve, for Diabetes recheck.  Counseling provided for all of the vaccine components Orders Placed This Encounter  Procedures   CBC with Differential/Platelet   CMP14+EGFR   Lipid panel   Bayer DCA Hb A1c Waived   Microalbumin / creatinine urine ratio   AMB Referral to Chronic Care Management Services    Caryl Pina, MD [facility] 01/07/2022, 11:38 AM

## 2022-01-08 LAB — CMP14+EGFR
ALT: 13 IU/L (ref 0–44)
AST: 15 IU/L (ref 0–40)
Albumin/Globulin Ratio: 1.9 (ref 1.2–2.2)
Albumin: 4 g/dL (ref 3.9–4.9)
Alkaline Phosphatase: 96 IU/L (ref 44–121)
BUN/Creatinine Ratio: 15 (ref 10–24)
BUN: 25 mg/dL (ref 8–27)
Bilirubin Total: 0.4 mg/dL (ref 0.0–1.2)
CO2: 23 mmol/L (ref 20–29)
Calcium: 9.2 mg/dL (ref 8.6–10.2)
Chloride: 101 mmol/L (ref 96–106)
Creatinine, Ser: 1.68 mg/dL — ABNORMAL HIGH (ref 0.76–1.27)
Globulin, Total: 2.1 g/dL (ref 1.5–4.5)
Glucose: 277 mg/dL — ABNORMAL HIGH (ref 70–99)
Potassium: 4.7 mmol/L (ref 3.5–5.2)
Sodium: 140 mmol/L (ref 134–144)
Total Protein: 6.1 g/dL (ref 6.0–8.5)
eGFR: 43 mL/min/{1.73_m2} — ABNORMAL LOW (ref >59)

## 2022-01-08 LAB — LIPID PANEL
Chol/HDL Ratio: 3 ratio (ref 0.0–5.0)
Cholesterol, Total: 119 mg/dL (ref 100–199)
HDL: 40 mg/dL (ref >39)
LDL Chol Calc (NIH): 60 mg/dL (ref 0–99)
Triglycerides: 100 mg/dL (ref 0–149)
VLDL Cholesterol Cal: 19 mg/dL (ref 5–40)

## 2022-01-08 LAB — CBC WITH DIFFERENTIAL/PLATELET
Basophils Absolute: 0 10*3/uL (ref 0.0–0.2)
Basos: 1 %
EOS (ABSOLUTE): 0.2 10*3/uL (ref 0.0–0.4)
Eos: 3 %
Hematocrit: 37.4 % — ABNORMAL LOW (ref 37.5–51.0)
Hemoglobin: 12.3 g/dL — ABNORMAL LOW (ref 13.0–17.7)
Immature Grans (Abs): 0 10*3/uL (ref 0.0–0.1)
Immature Granulocytes: 0 %
Lymphocytes Absolute: 2 10*3/uL (ref 0.7–3.1)
Lymphs: 30 %
MCH: 29.6 pg (ref 26.6–33.0)
MCHC: 32.9 g/dL (ref 31.5–35.7)
MCV: 90 fL (ref 79–97)
Monocytes Absolute: 0.7 10*3/uL (ref 0.1–0.9)
Monocytes: 10 %
Neutrophils Absolute: 3.7 10*3/uL (ref 1.4–7.0)
Neutrophils: 56 %
Platelets: 140 10*3/uL — ABNORMAL LOW (ref 150–450)
RBC: 4.16 x10E6/uL (ref 4.14–5.80)
RDW: 12 % (ref 11.6–15.4)
WBC: 6.6 10*3/uL (ref 3.4–10.8)

## 2022-01-10 ENCOUNTER — Telehealth: Payer: Self-pay

## 2022-01-10 NOTE — Chronic Care Management (AMB) (Signed)
  Chronic Care Management   Note  01/10/2022 Name: Greg Robertson MRN: [id number] DOB: [birth year]  Greg Robertson is a 71 y.o. year old male who is a primary care patient of Dettinger, Fransisca Kaufmann, MD. I reached out to [name] by phone today in response to a referral sent by Greg Robertson PCP.  [name] was given information about Chronic Care Management services today including:  CCM service includes personalized support from designated clinical staff supervised by his physician, including individualized plan of care and coordination with other care providers 24/7 contact phone numbers for assistance for urgent and routine care needs. Service will only be billed when office clinical staff spend 20 minutes or more in a month to coordinate care. Only one practitioner may furnish and bill the service in a calendar month. The patient may stop CCM services at any time (effective at the end of the month) by phone call to the office staff. The patient is responsible for co-pay (up to 20% after annual deductible is met) if co-pay is required by the individual health plan.   Patient agreed to services and verbal consent obtained.   Follow up plan: Telephone appointment with care management team member scheduled for:02/24/2022  Noreene Larsson, [place], [place] [address] Direct Dial: [phone] [name]

## 2022-01-12 ENCOUNTER — Telehealth: Payer: Self-pay | Admitting: Neurology

## 2022-01-12 NOTE — Telephone Encounter (Signed)
Medicare/bcbs supp no auth req on 01/03/22 voicemail was left.

## 2022-01-21 ENCOUNTER — Telehealth: Payer: Self-pay | Admitting: Family Medicine

## 2022-01-21 NOTE — Telephone Encounter (Signed)
Pt needs novolog flex pen and more than 100 pen needles sent to [facility].  Will route to Dr. Warrick Parisian to make sure dosage of Novolog flex pen is correct

## 2022-01-24 NOTE — Telephone Encounter (Signed)
Okay to go ahead and change his prescription so he gets the correct amount of pen needles per month and to refill him an EpiPen, diagnosis allergic reaction

## 2022-01-26 MED ORDER — INSULIN ASPART 100 UNIT/ML FLEXPEN: 10.0000 [IU] | PEN_INJECTOR | Freq: Three times a day (TID) | SUBCUTANEOUS | 2 refills | Status: DC

## 2022-01-26 MED ORDER — EPINEPHRINE 0.3 MG/0.3ML IJ SOAJ: 0.3000 mg | INTRAMUSCULAR | 1 refills | Status: DC | PRN

## 2022-01-26 MED ORDER — INSULIN PEN NEEDLE 32G X 4 MM MISC: 2 refills | Status: DC

## 2022-01-26 NOTE — Telephone Encounter (Signed)
Sent the refills for the patient

## 2022-02-16 ENCOUNTER — Telehealth: Payer: Self-pay

## 2022-02-16 NOTE — Telephone Encounter (Signed)
   CCM RN Visit Note   02-16-2022 Name: [name] MRN: [id number]      DOB: [birth year]  Subjective: Greg Robertson is a 71 y.o. year old male who is a primary care patient of Dr. Warrick Parisian The patient was referred to the Chronic Care Management team for assistance with care management needs subsequent to provider initiation of CCM services and plan of care.      An unsuccessful telephone outreach was attempted today to contact the patient about Chronic Care Management needs.    Plan:A HIPAA compliant phone message was left for the patient providing contact information and requesting a return call.  Noreene Larsson RN, MSN, CCM RN Care Manager  Chronic Care Management Direct Number: [phone]

## 2022-02-22 ENCOUNTER — Ambulatory Visit (INDEPENDENT_AMBULATORY_CARE_PROVIDER_SITE_OTHER): Payer: Medicare Other

## 2022-02-22 VITALS — Ht 74.0 in | Wt 190.0 lb

## 2022-02-22 DIAGNOSIS — Z0000 Encounter for general adult medical examination without abnormal findings: Secondary | ICD-10-CM

## 2022-02-22 NOTE — Patient Instructions (Signed)
Greg Robertson , Thank you for taking time to come for your Medicare Wellness Visit. I appreciate your ongoing commitment to your health goals. Please review the following plan we discussed and let me know if I can assist you in the future.   These are the goals we discussed:  Goals       Exercise 150 min/wk Moderate Activity      HEMOGLOBIN A1C < 7 (pt-stated)      Pt would like to get A!C at 7 with meds, diet and exercise.        This is a list of the screening recommended for you and due dates:  Health Maintenance  Topic Date Due   Yearly kidney health urinalysis for diabetes  01/25/2020   COVID-19 Vaccine (4 - Moderna risk series) 12/07/2020   Zoster (Shingles) Vaccine (1 of 2) 04/08/2022*   Hepatitis C Screening: USPSTF Recommendation to screen - Ages 18-79 yo.  01/08/2023*   Colon Cancer Screening  04/29/2022   Hemoglobin A1C  07/08/2022   Eye exam for diabetics  10/05/2022   Yearly kidney function blood test for diabetes  01/08/2023   Complete foot exam   01/08/2023   Medicare Annual Wellness Visit  02/23/2023   Tetanus Vaccine  07/22/2025   Pneumonia Vaccine  Completed   Flu Shot  Completed   HPV Vaccine  Aged Out  *Topic was postponed. The date shown is not the original due date.    Advanced directives: Please bring a copy of your health care power of attorney and living will to the office to be added to your chart at your convenience.   Conditions/risks identified: Aim for 30 minutes of exercise or brisk walking, 6-8 glasses of water, and 5 servings of fruits and vegetables each day.   Next appointment: Follow up in one year for your annual wellness visit.   Preventive Care 95 Years and Older, Male  Preventive care refers to lifestyle choices and visits with your health care provider that can promote health and wellness. What does preventive care include? A yearly physical exam. This is also called an annual well check. Dental exams once or twice a year. Routine  eye exams. Ask your health care provider how often you should have your eyes checked. Personal lifestyle choices, including: Daily care of your teeth and gums. Regular physical activity. Eating a healthy diet. Avoiding tobacco and drug use. Limiting alcohol use. Practicing safe sex. Taking low doses of aspirin every day. Taking vitamin and mineral supplements as recommended by your health care provider. What happens during an annual well check? The services and screenings done by your health care provider during your annual well check will depend on your age, overall health, lifestyle risk factors, and family history of disease. Counseling  Your health care provider may ask you questions about your: Alcohol use. Tobacco use. Drug use. Emotional well-being. Home and relationship well-being. Sexual activity. Eating habits. History of falls. Memory and ability to understand (cognition). Work and work Statistician. Screening  You may have the following tests or measurements: Height, weight, and BMI. Blood pressure. Lipid and cholesterol levels. These may be checked every 5 years, or more frequently if you are over 6 years old. Skin check. Lung cancer screening. You may have this screening every year starting at age 42 if you have a 30-pack-year history of smoking and currently smoke or have quit within the past 15 years. Fecal occult blood test (FOBT) of the stool. You may have this  test every year starting at age 63. Flexible sigmoidoscopy or colonoscopy. You may have a sigmoidoscopy every 5 years or a colonoscopy every 10 years starting at age 16. Prostate cancer screening. Recommendations will vary depending on your family history and other risks. Hepatitis C blood test. Hepatitis B blood test. Sexually transmitted disease (STD) testing. Diabetes screening. This is done by checking your blood sugar (glucose) after you have not eaten for a while (fasting). You may have this done  every 1-3 years. Abdominal aortic aneurysm (AAA) screening. You may need this if you are a current or former smoker. Osteoporosis. You may be screened starting at age 53 if you are at high risk. Talk with your health care provider about your test results, treatment options, and if necessary, the need for more tests. Vaccines  Your health care provider may recommend certain vaccines, such as: Influenza vaccine. This is recommended every year. Tetanus, diphtheria, and acellular pertussis (Tdap, Td) vaccine. You may need a Td booster every 10 years. Zoster vaccine. You may need this after age 18. Pneumococcal 13-valent conjugate (PCV13) vaccine. One dose is recommended after age 38. Pneumococcal polysaccharide (PPSV23) vaccine. One dose is recommended after age 42. Talk to your health care provider about which screenings and vaccines you need and how often you need them. This information is not intended to replace advice given to you by your health care provider. Make sure you discuss any questions you have with your health care provider. Document Released: 05/01/2015 Document Revised: 12/23/2015 Document Reviewed: 02/03/2015 Elsevier Interactive Patient Education  2017 [place] Prevention in the Home Falls can cause injuries. They can happen to people of all ages. There are many things you can do to make your home safe and to help prevent falls. What can I do on the outside of my home? Regularly fix the edges of walkways and driveways and fix any cracks. Remove anything that might make you trip as you walk through a door, such as a raised step or threshold. Trim any bushes or trees on the path to your home. Use bright outdoor lighting. Clear any walking paths of anything that might make someone trip, such as rocks or tools. Regularly check to see if handrails are loose or broken. Make sure that both sides of any steps have handrails. Any raised decks and porches should have  guardrails on the edges. Have any leaves, snow, or ice cleared regularly. Use sand or salt on walking paths during winter. Clean up any spills in your garage right away. This includes oil or grease spills. What can I do in the bathroom? Use night lights. Install grab bars by the toilet and in the tub and shower. Do not use towel bars as grab bars. Use non-skid mats or decals in the tub or shower. If you need to sit down in the shower, use a plastic, non-slip stool. Keep the floor dry. Clean up any water that spills on the floor as soon as it happens. Remove soap buildup in the tub or shower regularly. Attach bath mats securely with double-sided non-slip rug tape. Do not have throw rugs and other things on the floor that can make you trip. What can I do in the bedroom? Use night lights. Make sure that you have a light by your bed that is easy to reach. Do not use any sheets or blankets that are too big for your bed. They should not hang down onto the floor. Have a firm chair that  has side arms. You can use this for support while you get dressed. Do not have throw rugs and other things on the floor that can make you trip. What can I do in the kitchen? Clean up any spills right away. Avoid walking on wet floors. Keep items that you use a lot in easy-to-reach places. If you need to reach something above you, use a strong step stool that has a grab bar. Keep electrical cords out of the way. Do not use floor polish or wax that makes floors slippery. If you must use wax, use non-skid floor wax. Do not have throw rugs and other things on the floor that can make you trip. What can I do with my stairs? Do not leave any items on the stairs. Make sure that there are handrails on both sides of the stairs and use them. Fix handrails that are broken or loose. Make sure that handrails are as long as the stairways. Check any carpeting to make sure that it is firmly attached to the stairs. Fix any carpet  that is loose or worn. Avoid having throw rugs at the top or bottom of the stairs. If you do have throw rugs, attach them to the floor with carpet tape. Make sure that you have a light switch at the top of the stairs and the bottom of the stairs. If you do not have them, ask someone to add them for you. What else can I do to help prevent falls? Wear shoes that: Do not have high heels. Have rubber bottoms. Are comfortable and fit you well. Are closed at the toe. Do not wear sandals. If you use a stepladder: Make sure that it is fully opened. Do not climb a closed stepladder. Make sure that both sides of the stepladder are locked into place. Ask someone to hold it for you, if possible. Clearly mark and make sure that you can see: Any grab bars or handrails. First and last steps. Where the edge of each step is. Use tools that help you move around (mobility aids) if they are needed. These include: Canes. Walkers. Scooters. Crutches. Turn on the lights when you go into a dark area. Replace any light bulbs as soon as they burn out. Set up your furniture so you have a clear path. Avoid moving your furniture around. If any of your floors are uneven, fix them. If there are any pets around you, be aware of where they are. Review your medicines with your doctor. Some medicines can make you feel dizzy. This can increase your chance of falling. Ask your doctor what other things that you can do to help prevent falls. This information is not intended to replace advice given to you by your health care provider. Make sure you discuss any questions you have with your health care provider. Document Released: 01/29/2009 Document Revised: 09/10/2015 Document Reviewed: 05/09/2014 Elsevier Interactive Patient Education  2017 [employer].

## 2022-02-22 NOTE — Progress Notes (Addendum)
Subjective:   Greg Robertson is a 71 y.o. male who presents for Medicare Annual/Subsequent preventive examination.   I connected with  Greg Robertson on 02/22/22 by a audio enabled telemedicine application and verified that I am speaking with the correct person using two identifiers.  Patient Location: Home  Provider Location: Home Office  I discussed the limitations of evaluation and management by telemedicine. The patient expressed understanding and agreed to proceed.  Review of Systems     Cardiac Risk Factors include: advanced age (>56mn, >>6women);diabetes mellitus;dyslipidemia;male gender;hypertension     Objective:    Today's Vitals   02/22/22 1033  Weight: 190 lb (86.2 kg)  Height: '6\' 2"'$  (1.88 m)   Body mass index is 24.39 kg/m.     02/22/2022   10:37 AM 10/13/2021    1:40 PM 12/08/2020   11:23 AM 10/14/2020    2:42 PM 10/02/2020    2:00 PM 09/27/2020    3:32 PM 11/15/2019    9:52 AM  Advanced Directives  Does Patient Have a Medical Advance Directive? Yes No Yes No No No Yes  Type of AParamedicof ACardwellLiving will  Living will;Healthcare Power of Attorney    Living will  Does patient want to make changes to medical advance directive?   No - Patient declined No - Patient declined No - Patient declined  No - Patient declined  Copy of [place] Chart? No - copy requested  No - copy requested      Would patient like information on creating a medical advance directive?  No - Patient declined         Current Medications (verified) Outpatient Encounter Medications as of 02/22/2022  Medication Sig   acetaminophen (TYLENOL) 500 MG tablet Take 1,000 mg by mouth every 6 (six) hours as needed for mild pain or headache. Pain   atorvastatin (LIPITOR) 40 MG tablet Take 1 tablet (40 mg total) by mouth daily.   carvedilol (COREG) 12.5 MG tablet Take 1.5 tablets (18.75 mg total) by mouth 2 (two) times daily with a meal.    Cholecalciferol 50 MCG (2000 UT) TABS Take 2 tablets by mouth daily.   Continuous Blood Gluc Sensor (FREESTYLE LIBRE 2 SENSOR) MISC 1 each by Does not apply route every 14 (fourteen) days   EPINEPHrine 0.3 mg/0.3 mL IJ SOAJ injection Inject 0.3 mg into the muscle as needed for anaphylaxis.   furosemide (LASIX) 40 MG tablet Take 1 tablet (40 mg total) by mouth daily in the afternoon.   glucose blood test strip Use as instructed   insulin aspart (NOVOLOG) 100 UNIT/ML FlexPen Inject 10-20 Units into the skin 3 (three) times daily with meals.   insulin degludec (TRESIBA) 100 UNIT/ML FlexTouch Pen Inject 16-20 Units into the skin daily.   Insulin Pen Needle 32G X 4 MM MISC Use as directed   Lancets MISC 1 Lancet by Does not apply route 3 (three) times daily.   levocetirizine (XYZAL) 5 MG tablet Take 1 tablet (5 mg total) by mouth daily.   Multiple Vitamin (MULTIVITAMIN WITH MINERALS) TABS tablet Take 1 tablet by mouth daily. (0800)   rivaroxaban (XARELTO) 20 MG TABS tablet TAKE ONE TABLET BY MOUTH EVERY EVENING WITH SUPPER   sacubitril-valsartan (ENTRESTO) 97-103 MG Take 1 tablet by mouth 2 (two) times daily.   sodium zirconium cyclosilicate (LOKELMA) 10 g PACK packet Take 10 g by mouth daily.   spironolactone (ALDACTONE) 25 MG tablet Take 0.5  tablets (12.5 mg total) by mouth daily.   triamcinolone (NASACORT) 55 MCG/ACT AERO nasal inhaler INSTILL 1 SPRAY IN EACH NOSTRIL ONCE OR TWICE DAILY AS NEEDED.   vitamin C (ASCORBIC ACID) 500 MG tablet Take 500 mg by mouth in the morning. (0800)   No facility-administered encounter medications on file as of 02/22/2022.    Allergies (verified) Oxycodone   History: Past Medical History:  Diagnosis Date   Allergic rhinitis    Anemia    Atrial fibrillation ([place])    Persistent   Cancer of ascending colon, 7cm 05/25/2011   Congestive heart failure (CHF) (HCC)    Diabetes mellitus type I ([place])    Glaucoma    Hypertension    Pneumonia 1979 or 1980    Prostate nodule    Substance abuse ([place])    Past Surgical History:  Procedure Laterality Date   broken left shoulder blade and collar bone     COLONOSCOPY WITH PROPOFOL N/A 04/29/2021   Procedure: COLONOSCOPY WITH PROPOFOL;  Surgeon: Milus Banister, MD;  Location: [facility];  Service: Endoscopy;  Laterality: N/A;   ESOPHAGOGASTRODUODENOSCOPY (EGD) WITH PROPOFOL N/A 04/29/2021   Procedure: ESOPHAGOGASTRODUODENOSCOPY (EGD) WITH PROPOFOL;  Surgeon: Milus Banister, MD;  Location: [facility];  Service: Endoscopy;  Laterality: N/A;   FINGER SURGERY  2009   right middle   HEMOSTASIS CLIP PLACEMENT  04/29/2021   Procedure: HEMOSTASIS CLIP PLACEMENT;  Surgeon: Milus Banister, MD;  Location: [facility];  Service: Endoscopy;;   LAPAROSCOPIC ASSISTED 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     NASAL FRACTURE SURGERY  1968   POLYPECTOMY  04/29/2021   Procedure: POLYPECTOMY;  Surgeon: Milus Banister, MD;  Location: [facility];  Service: Endoscopy;;   PORTACATH PLACEMENT  07/01/2011   Procedure: INSERTION PORT-A-CATH;  Surgeon: Adin Hector, MD;  Location: [facility];  Service: General;  Laterality: Left;  Insertion of Port-A-Catheter Left Internal Jugular   RIGHT/LEFT HEART CATH AND CORONARY ANGIOGRAPHY N/A 12/08/2020   Procedure: RIGHT/LEFT HEART CATH AND CORONARY ANGIOGRAPHY;  Surgeon: Larey Dresser, MD;  Location: [facility];  Service: Cardiovascular;  Laterality: N/A;   TONSILLECTOMY  1957 - approximate   Family History  Problem Relation Age of Onset   Colon polyps Father    Lung cancer Father    Diabetes Father    Prostate cancer Father    Breast cancer Mother    Pancreatitis Mother        intestinal adhesions   Insomnia Mother    Colon cancer Neg Hx    Rectal cancer Neg Hx    Social History   Socioeconomic History   Marital status: Single    Spouse name: Not on file   Number of children: 0   Years of education: 34   Highest education level: Bachelor's  degree (e.g., BA, AB, BS)  Occupational History   Occupation: Retired    Comment: Press photographer  Tobacco Use   Smoking status: Former    Packs/day: 1.00    Years: 15.00    Total pack years: 15.00    Types: Cigarettes    Quit date: 04/18/1993    Years since quitting: 28.8   Smokeless tobacco: Never   Tobacco comments:    marijuana every night   Vaping Use   Vaping Use: Never used  Substance and Sexual Activity   Alcohol use: Not Currently    Alcohol/week: 5.0 standard drinks of alcohol    Types: 3 Cans of beer, 2 Shots  of liquor per week    Comment: 1 drink every 2 days   Drug use: Not Currently    Types: Marijuana    Comment: once a night marijuana   Sexual activity: Not Currently  Other Topics Concern   Not on file  Social History Narrative   Pt lives alone    R handed   Caffeine: no coffee/or soda. Has sweet tea for dinner.    Social Determinants of Health   Financial Resource Strain: Low Risk  (02/22/2022)   Overall Financial Resource Strain (CARDIA)    Difficulty of Paying Living Expenses: Not hard at all  Food Insecurity: No Food Insecurity (02/22/2022)   Hunger Vital Sign    Worried About Running Out of Food in the Last Year: Never true    Ran Out of Food in the Last Year: Never true  Transportation Needs: No Transportation Needs (02/22/2022)   PRAPARE - Hydrologist (Medical): No    Lack of Transportation (Non-Medical): No  Physical Activity: Sufficiently Active (02/22/2022)   Exercise Vital Sign    Days of Exercise per Week: 4 days    Minutes of Exercise per Session: 60 min  Stress: No Stress Concern Present (02/22/2022)   [place]    Feeling of Stress : Not at all  Social Connections: Moderately Isolated (02/22/2022)   Social Connection and Isolation Panel [NHANES]    Frequency of Communication with Friends and Family: More than three times a week    Frequency of Social  Gatherings with Friends and Family: More than three times a week    Attends Religious Services: More than 4 times per year    Active Member of [employer] or Organizations: No    Attends Music therapist: Never    Marital Status: Never married    Tobacco Counseling Counseling given: Not Answered Tobacco comments: marijuana every night    Clinical Intake:  Pre-visit preparation completed: Yes  Pain : No/denies pain     Nutritional Risks: None Diabetes: No  How often do you need to have someone help you when you read instructions, pamphlets, or other written materials from your doctor or pharmacy?: 1 - Never  Diabetic?yes Nutrition Risk Assessment:  Has the patient had any N/V/D within the last 2 months?  No  Does the patient have any non-healing wounds?  No  Has the patient had any unintentional weight loss or weight gain?  No   Diabetes:  Is the patient diabetic?  Yes  If diabetic, was a CBG obtained today?  No  Did the patient bring in their glucometer from home?  No  How often do you monitor your CBG's? 3 x day .   Financial Strains and Diabetes Management:  Are you having any financial strains with the device, your supplies or your medication? No .  Does the patient want to be seen by Chronic Care Management for management of their diabetes?  No  Would the patient like to be referred to a Nutritionist or for Diabetic Management?  No   Diabetic Exams:  Diabetic Eye Exam: Completed 11/2021 Diabetic Foot Exam: Overdue, Pt has been advised about the importance in completing this exam. Pt is scheduled for diabetic foot exam on next office visit .   Interpreter Needed?: No  Information entered by :: Jadene Pierini, LPN   Activities of Daily Living    02/22/2022   10:36 AM  In your  present state of health, do you have any difficulty performing the following activities:  Hearing? 0  Vision? 0  Difficulty concentrating or making decisions? 0  Walking or  climbing stairs? 0  Dressing or bathing? 0  Doing errands, shopping? 0  Preparing Food and eating ? N  Using the Toilet? N  In the past six months, have you accidently leaked urine? N  Do you have problems with loss of bowel control? N  Managing your Medications? N  Managing your Finances? N  Housekeeping or managing your Housekeeping? N    Patient Care Team: Dettinger, Fransisca Kaufmann, MD as PCP - General (Family Medicine) Ladell Pier, MD as Consulting Physician (Oncology) Minus Breeding, MD as Consulting Physician (Cardiology) [facility] Milus Banister, MD as Attending Physician (Gastroenterology) Lavera Guise, [facility] (Pharmacist) Lavera Guise, [facility] as Pharmacist (Family Medicine) Garvin Fila, MD as Referring Physician (Neurology)  Indicate any recent Medical Services you may have received from other than [facility] providers in the past year (date may be approximate).     Assessment:   This is a routine wellness examination for [place].  Hearing/Vision screen Vision Screening - Comments:: Annual eye exams wears glasses  Dietary issues and exercise activities discussed: Current Exercise Habits: Home exercise routine, Type of exercise: walking, Time (Minutes): 60, Frequency (Times/Week): 4, Weekly Exercise (Minutes/Week): 240, Intensity: Mild, Exercise limited by: None identified   Goals Addressed             This Visit's Progress    Exercise 150 min/wk Moderate Activity   On track      Depression Screen    02/22/2022   10:35 AM 01/07/2022   11:17 AM 10/29/2020   11:45 AM 08/24/2020    2:52 PM 04/02/2020   11:16 AM 11/15/2019    9:39 AM 10/04/2019   10:21 AM  PHQ 2/9 Scores  PHQ - 2 Score 0 3 0 0 0 0 0  PHQ- 9 Score 0 3         Fall Risk    02/22/2022   10:34 AM 01/07/2022   11:17 AM 10/29/2020   11:45 AM 08/24/2020    2:52 PM 04/02/2020   11:16 AM  [place] in the past year? 0 0 0 0 0  Number falls in past yr: 0      Injury  with Fall? 0      Risk for fall due to : No Fall Risks      Follow up Falls prevention discussed        FALL RISK PREVENTION PERTAINING TO THE HOME:  Any stairs in or around the home? Yes  If so, are there any without handrails? No  Home free of loose throw rugs in walkways, pet beds, electrical cords, etc? Yes  Adequate lighting in your home to reduce risk of falls? Yes   ASSISTIVE DEVICES UTILIZED TO PREVENT FALLS:  Life alert? No  Use of a cane, walker or w/c? No  Grab bars in the bathroom? No  Shower chair or bench in shower? No  Elevated toilet seat or a handicapped toilet? No       09/05/2017    3:39 PM  MMSE - Mini Mental State Exam  Orientation to time 5  Orientation to Place 5  Registration 3  Attention/ Calculation 5  Recall 3  Language- name 2 objects 2  Language- repeat 1  Language- follow 3 step command 3  Language-  read & follow direction 1  Write a sentence 1  Copy design 1  Total score 30        02/22/2022   10:37 AM 11/15/2019    9:42 AM 10/02/2018    2:44 PM  6CIT Screen  What Year? 0 points 0 points 0 points  What month? 0 points 0 points 0 points  What time? 0 points 0 points 0 points  Count back from 20 0 points 0 points 0 points  Months in reverse 0 points 0 points 0 points  Repeat phrase 0 points  0 points  Total Score 0 points  0 points    Immunizations Immunization History  Administered Date(s) Administered   Fluad Quad(high Dose 65+) 01/25/2019, 04/02/2020, 01/07/2022   Influenza Split 06/04/2011   Influenza, High Dose Seasonal PF 01/17/2018   Influenza,inj,Quad PF,6+ Mos 04/08/2013, 03/06/2014, 01/21/2015, 02/03/2016   Moderna SARS-COV2 Booster Vaccination 10/12/2020   Moderna Sars-Covid-2 Vaccination 05/23/2019, 06/21/2019, 02/12/2020   PPD Test 09/30/2020   Pneumococcal Conjugate-13 01/21/2015   Pneumococcal Polysaccharide-23 06/04/2011, 01/25/2019   Tdap 07/23/2015    TDAP status: Up to date  Flu Vaccine status: Up to  date  Pneumococcal vaccine status: Up to date  Covid-19 vaccine status: Completed vaccines  Qualifies for Shingles Vaccine? Yes   Zostavax completed No   Shingrix Completed?: No.    Education has been provided regarding the importance of this vaccine. Patient has been advised to call insurance company to determine out of pocket expense if they have not yet received this vaccine. Advised may also receive vaccine at local pharmacy or Health Dept. Verbalized acceptance and understanding.  Screening Tests Health Maintenance  Topic Date Due   Diabetic kidney evaluation - Urine ACR  01/25/2020   COVID-19 Vaccine (4 - Moderna risk series) 12/07/2020   Zoster Vaccines- Shingrix (1 of 2) 04/08/2022 (Originally 02/16/1970)   Hepatitis C Screening  01/08/2023 (Originally 02/16/1969)   COLONOSCOPY (Pts 45-64yr Insurance coverage will need to be confirmed)  04/29/2022   HEMOGLOBIN A1C  07/08/2022   OPHTHALMOLOGY EXAM  10/05/2022   Diabetic kidney evaluation - GFR measurement  01/08/2023   FOOT EXAM  01/08/2023   Medicare Annual Wellness (AWV)  02/23/2023   TETANUS/TDAP  07/22/2025   Pneumonia Vaccine 71 Years old  Completed   INFLUENZA VACCINE  Completed   HPV VACCINES  Aged Out    Health Maintenance  Health Maintenance Due  Topic Date Due   Diabetic kidney evaluation - Urine ACR  01/25/2020   COVID-19 Vaccine (4 - Moderna risk series) 12/07/2020    Colorectal cancer screening: Type of screening: Colonoscopy. Completed 01/12/223. Repeat every 1 years  Lung Cancer Screening: (Low Dose CT Chest recommended if Age 71-80years, 30 pack-year currently smoking OR have quit w/in 15years.) does not qualify.   Lung Cancer Screening Referral: n/a  Additional Screening:  Hepatitis C Screening: does not qualify;   Vision Screening: Recommended annual ophthalmology exams for early detection of glaucoma and other disorders of the eye. Is the patient up to date with their annual eye exam?  Yes   Who is the provider or what is the name of the office in which the patient attends annual eye exams? Dr.Groat  If pt is not established with a provider, would they like to be referred to a provider to establish care? No .   Dental Screening: Recommended annual dental exams for proper oral hygiene  Community Resource Referral / Chronic Care Management: CRR required this visit?  No   CCM required this visit?  No      Plan:     I have personally reviewed and noted the following in the patient's chart:   Medical and social history Use of alcohol, tobacco or illicit drugs  Current medications and supplements including opioid prescriptions. Patient is not currently taking opioid prescriptions. Functional ability and status Nutritional status Physical activity Advanced directives List of other physicians Hospitalizations, surgeries, and ER visits in previous 12 months Vitals Screenings to include cognitive, depression, and falls Referrals and appointments  In addition, I have reviewed and discussed with patient certain preventive protocols, quality metrics, and best practice recommendations. A written personalized care plan for preventive services as well as general preventive health recommendations were provided to patient.     Daphane Shepherd, LPN   09/18/6946   Nurse Notes: Due Colonoscopy in 04/2022   I have reviewed and agree with the above  documentation.   Evelina Dun, FNP  I have reviewed and agree with the above AWV documentation Caryl Pina, MD [facility] 02/27/2022, 9:52 PM

## 2022-02-23 ENCOUNTER — Encounter: Payer: Self-pay | Admitting: *Deleted

## 2022-02-23 ENCOUNTER — Ambulatory Visit (INDEPENDENT_AMBULATORY_CARE_PROVIDER_SITE_OTHER): Payer: Medicare Other | Admitting: *Deleted

## 2022-02-23 DIAGNOSIS — I480 Paroxysmal atrial fibrillation: Secondary | ICD-10-CM

## 2022-02-23 DIAGNOSIS — E1069 Type 1 diabetes mellitus with other specified complication: Secondary | ICD-10-CM

## 2022-02-23 NOTE — Chronic Care Management (AMB) (Signed)
Chronic Care Management   CCM RN Visit Note  02/23/2022 Name: Greg Robertson MRN: [id number] DOB: 04/06/1951  Subjective: Greg Robertson is a 71 y.o. year old male who is a primary care patient of Dettinger, Fransisca Kaufmann, MD. The patient was referred to the Chronic Care Management team for assistance with care management needs subsequent to provider initiation of CCM services and plan of care.    Today's Visit:  Engaged with patient by telephone for initial visit.     SDOH Interventions Today    Flowsheet Row Most Recent Value  SDOH Interventions   Food Insecurity Interventions Intervention Not Indicated  Housing Interventions Intervention Not Indicated  Transportation Interventions Intervention Not Indicated  Utilities Interventions Intervention Not Indicated  Alcohol Usage Interventions Intervention Not Indicated (Score <7)  Financial Strain Interventions Intervention Not Indicated  Physical Activity Interventions Intervention Not Indicated  Stress Interventions Intervention Not Indicated  Social Connections Interventions Intervention Not Indicated         Goals Addressed             This Visit's Progress    CCM (ATRIAL FIBRILLATION) EXPECTED OUTCOME: MONITOR, SELF-MANAGE AND REDUCE SYMPTOMS OF ATRIAL FIBRILLATION       Current Barriers:  Knowledge Deficits related to Atrial Fibrillation Chronic Disease Management support and education needs related to Atrial Fibrillation management Patient reports he has not had any recent Atrial Fibrillation symptoms/ exacerbation, reports takes all medications as prescribed.  Planned Interventions: Counseled on increased risk of stroke due to Afib and benefits of anticoagulation for stroke prevention           Reviewed importance of adherence to anticoagulant exactly as prescribed Counseled on bleeding risk associated with Atrial Fibrillation and taking blood thinner and importance of self-monitoring for signs/symptoms of  bleeding Counseled on importance of regular laboratory monitoring as prescribed Afib action plan reviewed Screening for signs and symptoms of depression related to chronic disease state Assessed social determinant of health barriers Education mailed- Atrial Fibrillation  Symptom Management: Take medications as prescribed   Attend all scheduled provider appointments Call pharmacy for medication refills 3-7 days in advance of running out of medications Attend church or other social activities Perform all self care activities independently  Perform IADL's (shopping, preparing meals, housekeeping, managing finances) independently Call provider office for new concerns or questions  - check pulse (heart) rate once a day - make a plan to exercise regularly - make a plan to eat healthy - keep all lab appointments - take medicine as prescribed Look over Atrial Fibrillation action plan mailed  Follow Up Plan: Telephone follow up appointment with care management team member scheduled for: 03/23/22 at 1045 am       CCM (DIABETES) EXPECTED OUTCOME:  MONITOR, SELF-MANAGE AND REDUCE SYMPTOMS OF DIABETES       Current Barriers:  Knowledge Deficits related to Diabetes management Chronic Disease Management support and education needs related to Diabetes and diet Patient reports he lives alone, has friends he can call on if needed, continues to drive, is independent with all aspects of his care, no longer uses CGM Freestyle Libre because of inaccuracy, pt reports he checks CBG BID with fasting ranges 150-165 with some readings near 200, random ranges around 200.   Patient reports he exercises at the gym several times per week, does not follow a special diet, does not drink sugary soft drinks  Planned Interventions: Provided education to patient about basic DM disease process; Reviewed medications with patient and discussed  importance of medication adherence;        Reviewed prescribed diet with  patient carbohydrate modified, plate method; Discussed plans with patient for ongoing care management follow up and provided patient with direct contact information for care management team;      Provided patient with written educational materials related to hypo and hyperglycemia and importance of correct treatment;       Advised patient, providing education and rationale, to check cbg twice daily and record        Review of patient status, including review of consultants reports, relevant laboratory and other test results, and medications completed;       Screening for signs and symptoms of depression related to chronic disease state;        Assessed social determinant of health barriers;        Reviewed CBG log with patient  Symptom Management: Take medications as prescribed   Attend all scheduled provider appointments Call pharmacy for medication refills 3-7 days in advance of running out of medications Attend church or other social activities Perform all self care activities independently  Perform IADL's (shopping, preparing meals, housekeeping, managing finances) independently Call provider office for new concerns or questions  check blood sugar at prescribed times: twice daily check feet daily for cuts, sores or redness enter blood sugar readings and medication or insulin into daily log take the blood sugar log to all doctor visits take the blood sugar meter to all doctor visits trim toenails straight across eat fish at least once per week fill half of plate with vegetables manage portion size read food labels for fat, fiber, carbohydrates and portion size Look over education mailed-hypoglycemia Continue working out at the gym- keep up the good work!  Follow Up Plan: Telephone follow up appointment with care management team member scheduled for:  04/02/22           Plan:Telephone follow up appointment with care management team member scheduled for:  03/23/22 at 71  am  Jacqlyn Larsen [facility], BSN RN Case Manager [place] [phone]

## 2022-02-23 NOTE — Patient Instructions (Signed)
Please call the care guide team at [phone] if you need to cancel or reschedule your appointment.   If you are experiencing a Mental Health or [place] or need someone to talk to, please call the Suicide and Crisis Lifeline: 988 call the [place] National Suicide Prevention Lifeline: (229)764-5003 or TTY: 906 885 6315 TTY 231 642 3604) to talk to a trained counselor call 1-800-273-TALK (toll free, 24 hour hotline) go to [facility] [place] [phone]) call the [facility]: [phone] call 911   Following is a copy of your full provider care plan:   Goals Addressed             This Visit's Progress    CCM (ATRIAL FIBRILLATION) EXPECTED OUTCOME: MONITOR, SELF-MANAGE AND REDUCE SYMPTOMS OF ATRIAL FIBRILLATION       Current Barriers:  Knowledge Deficits related to Atrial Fibrillation Chronic Disease Management support and education needs related to Atrial Fibrillation management Patient reports he has not had any recent Atrial Fibrillation symptoms/ exacerbation, reports takes all medications as prescribed.  Planned Interventions: Counseled on increased risk of stroke due to Afib and benefits of anticoagulation for stroke prevention           Reviewed importance of adherence to anticoagulant exactly as prescribed Counseled on bleeding risk associated with Atrial Fibrillation and taking blood thinner and importance of self-monitoring for signs/symptoms of bleeding Counseled on importance of regular laboratory monitoring as prescribed Afib action plan reviewed Screening for signs and symptoms of depression related to chronic disease state Assessed social determinant of health barriers Education mailed- Atrial Fibrillation  Symptom Management: Take medications as prescribed   Attend all scheduled provider appointments Call pharmacy for medication refills 3-7 days in advance of running out of  medications Attend church or other social activities Perform all self care activities independently  Perform IADL's (shopping, preparing meals, housekeeping, managing finances) independently Call provider office for new concerns or questions  - check pulse (heart) rate once a day - make a plan to exercise regularly - make a plan to eat healthy - keep all lab appointments - take medicine as prescribed Look over Atrial Fibrillation action plan mailed  Follow Up Plan: Telephone follow up appointment with care management team member scheduled for: 03/23/22 at 1045 am       CCM (DIABETES) EXPECTED OUTCOME:  MONITOR, SELF-MANAGE AND REDUCE SYMPTOMS OF DIABETES       Current Barriers:  Knowledge Deficits related to Diabetes management Chronic Disease Management support and education needs related to Diabetes and diet Patient reports he lives alone, has friends he can call on if needed, continues to drive, is independent with all aspects of his care, no longer uses CGM Freestyle Libre because of inaccuracy, pt reports he checks CBG BID with fasting ranges 150-165 with some readings near 200, random ranges around 200.   Patient reports he exercises at the gym several times per week, does not follow a special diet, does not drink sugary soft drinks  Planned Interventions: Provided education to patient about basic DM disease process; Reviewed medications with patient and discussed importance of medication adherence;        Reviewed prescribed diet with patient carbohydrate modified, plate method; Discussed plans with patient for ongoing care management follow up and provided patient with direct contact information for care management team;      Provided patient with written educational materials related to hypo and hyperglycemia and importance of correct treatment;  Advised patient, providing education and rationale, to check cbg twice daily and record        Review of patient status, including  review of consultants reports, relevant laboratory and other test results, and medications completed;       Screening for signs and symptoms of depression related to chronic disease state;        Assessed social determinant of health barriers;        Reviewed CBG log with patient  Symptom Management: Take medications as prescribed   Attend all scheduled provider appointments Call pharmacy for medication refills 3-7 days in advance of running out of medications Attend church or other social activities Perform all self care activities independently  Perform IADL's (shopping, preparing meals, housekeeping, managing finances) independently Call provider office for new concerns or questions  check blood sugar at prescribed times: twice daily check feet daily for cuts, sores or redness enter blood sugar readings and medication or insulin into daily log take the blood sugar log to all doctor visits take the blood sugar meter to all doctor visits trim toenails straight across eat fish at least once per week fill half of plate with vegetables manage portion size read food labels for fat, fiber, carbohydrates and portion size Look over education mailed-hypoglycemia Continue working out at the gym- keep up the good work!  Follow Up Plan: Telephone follow up appointment with care management team member scheduled for:  04/02/22           The patient verbalized understanding of instructions, educational materials, and care plan provided today and agreed to receive a mailed copy of patient instructions, educational materials, and care plan.   Telephone follow up appointment with care management team member scheduled for:  03/23/22 at 1045 am  Atrial Fibrillation  Atrial fibrillation is a type of irregular or rapid heartbeat (arrhythmia). In atrial fibrillation, the top part of the heart (atria) beats in an irregular pattern. This makes the heart unable to pump blood normally and  effectively. The goal of treatment is to prevent blood clots from forming, control your heart rate, or restore your heartbeat to a normal rhythm. If this condition is not treated, it can cause serious problems, such as a weakened heart muscle (cardiomyopathy) or a stroke. What are the causes? This condition is often caused by medical conditions that damage the heart's electrical system. These include: High blood pressure (hypertension). This is the most common cause. Certain heart problems or conditions, such as heart failure, coronary artery disease, heart valve problems, or heart surgery. Diabetes. Overactive thyroid (hyperthyroidism). Obesity. Chronic kidney disease. In some cases, the cause of this condition is not known. What increases the risk? This condition is more likely to develop in: Older people. People who smoke. Athletes who do endurance exercise. People who have a family history of atrial fibrillation. Men. People who use drugs. People who drink a lot of alcohol. People who have lung conditions, such as emphysema, pneumonia, or COPD. People who have obstructive sleep apnea. What are the signs or symptoms? Symptoms of this condition include: A feeling that your heart is racing or beating irregularly. Discomfort or pain in your chest. Shortness of breath. Sudden light-headedness or weakness. Tiring easily during exercise or activity. Fatigue. Syncope (fainting). Sweating. In some cases, there are no symptoms. How is this diagnosed? Your health care provider may detect atrial fibrillation when taking your pulse. If detected, this condition may be diagnosed with: An electrocardiogram (ECG) to check electrical signals of  the heart. An ambulatory cardiac monitor to record your heart's activity for a few days. A transthoracic echocardiogram (TTE) to create pictures of your heart. A transesophageal echocardiogram (TEE) to create even closer pictures of your heart. A  stress test to check your blood supply while you exercise. Imaging tests, such as a CT scan or chest X-ray. Blood tests. How is this treated? Treatment depends on underlying conditions and how you feel when you experience atrial fibrillation. This condition may be treated with: Medicines to prevent blood clots or to treat heart rate or heart rhythm problems. Electrical cardioversion to reset the heart's rhythm. A pacemaker to correct abnormal heart rhythm. Ablation to remove the heart tissue that sends abnormal signals. Left atrial appendage closure to seal the area where blood clots can form. In some cases, underlying conditions will be treated. Follow these instructions at home: Medicines Take over-the counter and prescription medicines only as told by your health care provider. Do not take any new medicines without talking to your health care provider. If you are taking blood thinners: Talk with your health care provider before you take any medicines that contain aspirin or NSAIDs, such as ibuprofen. These medicines increase your risk for dangerous bleeding. Take your medicine exactly as told, at the same time every day. Avoid activities that could cause injury or bruising, and follow instructions about how to prevent falls. Wear a medical alert bracelet or carry a card that lists what medicines you take. Lifestyle     Do not use any products that contain nicotine or tobacco, such as cigarettes, e-cigarettes, and chewing tobacco. If you need help quitting, ask your health care provider. Eat heart-healthy foods. Talk with a dietitian to make an eating plan that is right for you. Exercise regularly as told by your health care provider. Do not drink alcohol. Lose weight if you are overweight. Do not use drugs, including cannabis. General instructions If you have obstructive sleep apnea, manage your condition as told by your health care provider. Do not use diet pills unless your  health care provider approves. Diet pills can make heart problems worse. Keep all follow-up visits as told by your health care provider. This is important. Contact a health care provider if you: Notice a change in the rate, rhythm, or strength of your heartbeat. Are taking a blood thinner and you notice more bruising. Tire more easily when you exercise or do heavy work. Have a sudden change in weight. Get help right away if you have:  Chest pain, abdominal pain, sweating, or weakness. Trouble breathing. Side effects of blood thinners, such as blood in your vomit, stool, or urine, or bleeding that cannot stop. Any symptoms of a stroke. "BE FAST" is an easy way to remember the main warning signs of a stroke: B - Balance. Signs are dizziness, sudden trouble walking, or loss of balance. E - Eyes. Signs are trouble seeing or a sudden change in vision. F - Face. Signs are sudden weakness or numbness of the face, or the face or eyelid drooping on one side. A - Arms. Signs are weakness or numbness in an arm. This happens suddenly and usually on one side of the body. S - Speech. Signs are sudden trouble speaking, slurred speech, or trouble understanding what people say. T - Time. Time to call emergency services. Write down what time symptoms started. Other signs of a stroke, such as: A sudden, severe headache with no known cause. Nausea or vomiting. Seizure. These symptoms may  represent a serious problem that is an emergency. Do not wait to see if the symptoms will go away. Get medical help right away. Call your local emergency services (911 in the [place] Do not drive yourself to the hospital. Summary Atrial fibrillation is a type of irregular or rapid heartbeat (arrhythmia). Symptoms include a feeling that your heart is beating fast or irregularly. You may be given medicines to prevent blood clots or to treat heart rate or heart rhythm problems. Get help right away if you have signs or symptoms  of a stroke. Get help right away if you cannot catch your breath or have chest pain or pressure. This information is not intended to replace advice given to you by your health care provider. Make sure you discuss any questions you have with your health care provider. Document Revised: 09/26/2018 Document Reviewed: 09/26/2018 Elsevier Patient Education  [place]. Hypoglycemia Hypoglycemia occurs when the level of sugar (glucose) in the blood is too low. Hypoglycemia can happen in people who have or do not have diabetes. It can develop quickly, and it can be a medical emergency. For most people, a blood glucose level below 70 mg/dL (3.9 mmol/L) is considered hypoglycemia. Glucose is a type of sugar that provides the body's main source of energy. Certain hormones (insulin and glucagon) control the level of glucose in the blood. Insulin lowers blood glucose, and glucagon raises blood glucose. Hypoglycemia can result from having too much insulin in the bloodstream, or from not eating enough food that contains glucose. You may also have reactive hypoglycemia, which happens within 4 hours after eating a meal. What are the causes? Hypoglycemia occurs most often in people who have diabetes and may be caused by: Diabetes medicine. Not eating enough, or not eating often enough. Increased physical activity. Drinking alcohol on an empty stomach. If you do not have diabetes, hypoglycemia may be caused by: A tumor in the pancreas. Not eating enough, or not eating for long periods at a time (fasting). A severe infection or illness. Problems after having bariatric surgery. Organ failure, such as kidney or liver failure. Certain medicines. What increases the risk? Hypoglycemia is more likely to develop in people who: Have diabetes and take medicines to lower blood glucose. Abuse alcohol. Have a severe illness. What are the signs or symptoms? Symptoms vary depending on whether the condition is  mild, moderate, or severe. Mild hypoglycemia Hunger. Sweating and feeling clammy. Dizziness or feeling light-headed. Sleepiness or restless sleep. Nausea. Increased heart rate. Headache. Blurry vision. Mood changes, such as irritability or anxiety. Tingling or numbness around the mouth, lips, or tongue. Moderate hypoglycemia Confusion and poor judgment. Behavior changes. Weakness. Irregular heartbeat. A change in coordination. Severe hypoglycemia Severe hypoglycemia is a medical emergency. It can cause: Fainting. Seizures. Loss of consciousness (coma). Death. How is this diagnosed? Hypoglycemia is diagnosed with a blood test to measure your blood glucose level. This blood test is done while you are having symptoms. Your health care provider may also do a physical exam and review your medical history. How is this treated? This condition can be treated by immediately eating or drinking something that contains sugar with 15 grams of fast-acting carbohydrate, such as: 4 oz (120 mL) of fruit juice. 4 oz (120 mL) of regular soda (not diet soda). Several pieces of hard candy. Check food labels to find out how many pieces to eat for 15 grams. 1 Tbsp (15 mL) of sugar or honey. 4 glucose tablets. 1 tube of  glucose gel. Treating hypoglycemia if you have diabetes If you are alert and able to swallow safely, follow the 15:15 rule: Take 15 grams of a fast-acting carbohydrate. Talk with your health care provider about how much you should take. Options for getting 15 grams of fast-acting carbohydrate include: Glucose tablets (take 4 tablets). Several pieces of hard candy. Check food labels to find out how many pieces to eat for 15 grams. 4 oz (120 mL) of fruit juice. 4 oz (120 mL) of regular soda (not diet soda). 1 Tbsp (15 mL) of sugar or honey. 1 tube of glucose gel. Check your blood glucose 15 minutes after you take the carbohydrate. If the repeat blood glucose level is still at or  below 70 mg/dL (3.9 mmol/L), take 15 grams of a carbohydrate again. If your blood glucose level does not increase above 70 mg/dL (3.9 mmol/L) after 3 tries, seek emergency medical care. After your blood glucose level returns to normal, eat a meal or a snack within 1 hour.  Treating severe hypoglycemia Severe hypoglycemia is when your blood glucose level is below 54 mg/dL (3 mmol/L). Severe hypoglycemia is a medical emergency. Get medical help right away. If you have severe hypoglycemia and you cannot eat or drink, you will need to be given glucagon. A family member or close friend should learn how to check your blood glucose and how to give you glucagon. Ask your health care provider if you need to have an emergency glucagon kit available. Severe hypoglycemia may need to be treated in a hospital. The treatment may include getting glucose through an IV. You may also need treatment for the cause of your hypoglycemia. Follow these instructions at home:  General instructions Take over-the-counter and prescription medicines only as told by your health care provider. Monitor your blood glucose as told by your health care provider. If you drink alcohol: Limit how much you have to: 0-1 drink a day for women who are not pregnant. 0-2 drinks a day for men. Know how much alcohol is in your drink. In the [place] one drink equals one 12 oz bottle of beer (355 mL), one 5 oz glass of wine (148 mL), or one 1 oz glass of hard liquor (44 mL). Be sure to eat food along with drinking alcohol. Be aware that alcohol is absorbed quickly and may have lingering effects that may result in hypoglycemia later. Be sure to do ongoing glucose monitoring. Keep all follow-up visits. This is important. If you have diabetes: Always have a fast-acting carbohydrate (15 grams) option with you to treat low blood glucose. Follow your diabetes management plan as directed by your health care provider. Make sure you: Know the symptoms  of hypoglycemia. It is important to treat it right away to prevent it from becoming severe. Check your blood glucose as often as told. Always check before and after exercise. Always check your blood glucose before you drive a motorized vehicle. Take your medicines as told. Follow your meal plan. Eat on time, and do not skip meals. Share your diabetes management plan with people in your workplace, school, and household. Carry a medical alert card or wear medical alert jewelry. Where to find more information American Diabetes Association: www.diabetes.org Contact a health care provider if: You have problems keeping your blood glucose in your target range. You have frequent episodes of hypoglycemia. Get help right away if: You continue to have hypoglycemia symptoms after eating or drinking something that contains 15 grams of fast-acting carbohydrate, and  you cannot get your blood glucose above 70 mg/dL (3.9 mmol/L) while following the 15:15 rule. Your blood glucose is below 54 mg/dL (3 mmol/L). You have a seizure. You faint. These symptoms may represent a serious problem that is an emergency. Do not wait to see if the symptoms will go away. Get medical help right away. Call your local emergency services (911 in the [place] Do not drive yourself to the hospital. Summary Hypoglycemia occurs when the level of sugar (glucose) in the blood is too low. Hypoglycemia can happen in people who have or do not have diabetes. It can develop quickly, and it can be a medical emergency. Make sure you know the symptoms of hypoglycemia and how to treat it. Always have a fast-acting carbohydrate option with you to treat low blood sugar. This information is not intended to replace advice given to you by your health care provider. Make sure you discuss any questions you have with your health care provider. Document Revised: 03/05/2020 Document Reviewed: 03/05/2020 Elsevier Patient Education  [place].

## 2022-02-23 NOTE — Chronic Care Management (AMB) (Signed)
Chronic Care Management Provider Comprehensive Care Plan    02/23/2022 Name: Greg Robertson MRN: [id number] DOB: September 26, 1950  Referral to Chronic Care Management (CCM) services was placed by Provider:  Caryl Pina on Date: 01/07/22.  Chronic Condition 1: Diabetes type 1 Provider Assessment and Plan  DM type 1 (diabetes mellitus, type 1) (HCC) - Primary (Chronic)     Relevant Medications    insulin degludec (TRESIBA) 100 UNIT/ML FlexTouch Pen    Other Relevant Orders    CBC with Differential/Platelet    CMP14+EGFR    Lipid panel    Bayer DCA Hb A1c Waived    Microalbumin / creatinine urine ratio     Expected Outcome/Goals Addressed This Visit (Provider CCM goals/Provider Assessment and plan  CCM (DIABETES) EXPECTED OUTCOME:  MONITOR, SELF-MANAGE AND REDUCE SYMPTOMS OF DIABETES  Symptom Management Condition 1: Take medications as prescribed   Attend all scheduled provider appointments Call pharmacy for medication refills 3-7 days in advance of running out of medications Attend church or other social activities Perform all self care activities independently  Perform IADL's (shopping, preparing meals, housekeeping, managing finances) independently Call provider office for new concerns or questions  check blood sugar at prescribed times: twice daily check feet daily for cuts, sores or redness enter blood sugar readings and medication or insulin into daily log take the blood sugar log to all doctor visits take the blood sugar meter to all doctor visits trim toenails straight across eat fish at least once per week fill half of plate with vegetables manage portion size read food labels for fat, fiber, carbohydrates and portion size Look over education mailed-hypoglycemia Continue working out at the gym- keep up the good work!  Chronic Condition 2: Atrial Fibrillation Provider Assessment and Plan Patient is currently on carvedilol and furosemide and spironolactone, and  their blood pressure today is Entresto and spironolactone and furosemide and carvedilol. Patient denies any lightheadedness or dizziness. Patient denies headaches, blurred vision, chest pains, shortness of breath, or weakness. Denies any side effects from medication and is content with current medication.    Expected Outcome/Goals Addressed This Visit (Provider CCM goals/Provider Assessment and plan  CCM (ATRIAL FIBRILLATION) EXPECTED OUTCOME: MONITOR, SELF-MANAGE AND REDUCE SYMPTOMS OF ATRIAL FIBRILLATION  Symptom Management Condition 2: Take medications as prescribed   Attend all scheduled provider appointments Call pharmacy for medication refills 3-7 days in advance of running out of medications Attend church or other social activities Perform all self care activities independently  Perform IADL's (shopping, preparing meals, housekeeping, managing finances) independently Call provider office for new concerns or questions  - check pulse (heart) rate once a day - make a plan to exercise regularly - make a plan to eat healthy - keep all lab appointments - take medicine as prescribed Look over Atrial Fibrillation action plan mailed  Problem List Patient Active Problem List   Diagnosis Date Noted   Chronic insomnia 11/24/2021   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) 09/04/2017   Benign prostatic hyperplasia 09/04/2017   Hyperlipidemia due to type 1 diabetes mellitus ([place]) 12/12/2013   Hypertension associated with diabetes ([place]) 03/14/2012   Chronic CHF (congestive heart failure) ([place]) 03/12/2012   Lynch syndrome 07/28/2011  History of  colon cancer 05/25/2011   DM type 1 (diabetes mellitus, type 1) ([place]) 05/10/2011   Glaucoma 05/10/2011    Medication Management  Current Outpatient Medications:    acetaminophen (TYLENOL) 500 MG tablet, Take 1,000 mg by mouth every 6 (six) hours as needed for mild pain or headache. Pain, Disp: , Rfl:    atorvastatin (LIPITOR) 40 MG tablet, Take 1 tablet (40 mg total) by mouth daily., Disp: 30 tablet, Rfl: 3   carvedilol (COREG) 12.5 MG tablet, Take 1.5 tablets (18.75 mg total) by mouth 2 (two) times daily with a meal., Disp: 270 tablet, Rfl: 3   Cholecalciferol 50 MCG (2000 UT) TABS, Take 2 tablets by mouth daily., Disp: , Rfl:    Continuous Blood Gluc Sensor (FREESTYLE LIBRE 2 SENSOR) MISC, 1 each by Does not apply route every 14 (fourteen) days, Disp: 6 each, Rfl: 3   EPINEPHrine 0.3 mg/0.3 mL IJ SOAJ injection, Inject 0.3 mg into the muscle as needed for anaphylaxis., Disp: 1 each, Rfl: 1   furosemide (LASIX) 40 MG tablet, Take 1 tablet (40 mg total) by mouth daily in the afternoon., Disp: 90 tablet, Rfl: 3   glucose blood test strip, Use as instructed, Disp: 100 each, Rfl: 12   insulin aspart (NOVOLOG) 100 UNIT/ML FlexPen, Inject 10-20 Units into the skin 3 (three) times daily with meals., Disp: 15 mL, Rfl: 2   insulin degludec (TRESIBA) 100 UNIT/ML FlexTouch Pen, Inject 16-20 Units into the skin daily., Disp: , Rfl:    Insulin Pen Needle 32G X 4 MM MISC, Use as directed, Disp: 150 each, Rfl: 2   Lancets MISC, 1 Lancet by Does not apply route 3 (three) times daily., Disp: 100 each, Rfl: 3   levocetirizine (XYZAL) 5 MG tablet, Take 1 tablet (5 mg total) by mouth daily., Disp: 90 tablet, Rfl: 3   Multiple Vitamin (MULTIVITAMIN WITH MINERALS) TABS tablet, Take 1 tablet by mouth daily. (0800), Disp: , Rfl:    rivaroxaban (XARELTO) 20 MG TABS tablet, TAKE ONE TABLET BY MOUTH EVERY EVENING WITH SUPPER, Disp: 30 tablet, Rfl: 11   sacubitril-valsartan (ENTRESTO) 97-103 MG, Take 1 tablet by mouth 2  (two) times daily., Disp: 180 tablet, Rfl: 3   sodium zirconium cyclosilicate (LOKELMA) 10 g PACK packet, Take 10 g by mouth daily., Disp: 90 packet, Rfl: 3   spironolactone (ALDACTONE) 25 MG tablet, Take 0.5 tablets (12.5 mg total) by mouth daily., Disp: 45 tablet, Rfl: 3   triamcinolone (NASACORT) 55 MCG/ACT AERO nasal inhaler, INSTILL 1 SPRAY IN EACH NOSTRIL ONCE OR TWICE DAILY AS NEEDED., Disp: 16.5 Bottle, Rfl: 11   vitamin C (ASCORBIC ACID) 500 MG tablet, Take 500 mg by mouth in the morning. (0800), Disp: , Rfl:   Cognitive Assessment Identity Confirmed: : Name; DOB Cognitive Status: Normal   Functional Assessment Hearing Difficulty or Deaf: no Wear Glasses or Blind: yes Vision Management: reading glasses Concentrating, Remembering or Making Decisions Difficulty (CP): no Difficulty Communicating: no Difficulty Eating/Swallowing: no Walking or Climbing Stairs Difficulty: no Dressing/Bathing Difficulty: no Doing Errands Independently Difficulty (such as shopping) (CP): no   Caregiver Assessment  Primary Source of Support/Comfort: nonrelative caregiver Name of Support/Comfort Primary Source: friends in the community People in Home: alone   Planned Interventions  Provided education to patient about basic DM disease process; Reviewed medications with patient and discussed importance of medication adherence;        Reviewed prescribed diet with patient carbohydrate modified, plate method; Discussed plans with patient  for ongoing care management follow up and provided patient with direct contact information for care management team;      Provided patient with written educational materials related to hypo and hyperglycemia and importance of correct treatment;       Advised patient, providing education and rationale, to check cbg twice daily and record        Review of patient status, including review of consultants reports, relevant laboratory and other test results, and medications  completed;       Screening for signs and symptoms of depression related to chronic disease state;        Assessed social determinant of health barriers;        Reviewed CBG log with patient Counseled on increased risk of stroke due to Afib and benefits of anticoagulation for stroke prevention           Reviewed importance of adherence to anticoagulant exactly as prescribed Counseled on bleeding risk associated with Atrial Fibrillation and taking blood thinner and importance of self-monitoring for signs/symptoms of bleeding Counseled on importance of regular laboratory monitoring as prescribed Afib action plan reviewed Screening for signs and symptoms of depression related to chronic disease state Assessed social determinant of health barriers Education mailed- Atrial Fibrillation  Interaction and coordination with outside resources, practitioners, and providers See CCM Referral  Care Plan: Printed and mailed to patient

## 2022-02-24 ENCOUNTER — Ambulatory Visit: Payer: Medicare Other | Admitting: Pharmacist

## 2022-02-24 DIAGNOSIS — E103393 Type 1 diabetes mellitus with moderate nonproliferative diabetic retinopathy without macular edema, bilateral: Secondary | ICD-10-CM

## 2022-02-24 DIAGNOSIS — I480 Paroxysmal atrial fibrillation: Secondary | ICD-10-CM

## 2022-02-24 NOTE — Progress Notes (Signed)
Chronic Care Management Pharmacy Note  02/24/2022 Name:  Greg Robertson MRN:  [id number] DOB:  1950-10-21  Summary:  Diabetes: New goal. Uncontrolled TYPE 1 DM-A1C 8.8%, GFR 43, BP soft; current treatment:basal/bolus;  Tresiba 16-20 taking 18 units every morning (very brittle) FBG 88 this AM Novolog 10-20 units 3 times daily (avg 1u per 6 grams of carbs)--not compliant with scale  Patient not utilizing bolus insulin due to concern for hypoglycemia Libre 2 CGM samples given for patient to retry the system (used previously, but reports malfunctioning reader/system)  Lab Results  Component Value Date   HGBA1C 8.8 (H) 01/07/2022  Using relion brand [employer] glucometer Utilizing pill packaging from [place] all mediations and purpose  Current glucose readings: fasting glucose: 80-100s, post prandial glucose: varies up to 200s Discussed meal planning options and Plate method for healthy eating Avoid sugary drinks and desserts Incorporate balanced protein, non starchy veggies, 1 serving of carbohydrate with each meal Increase water intake Increase physical activity as able Current exercise: ENCOURAGED ONLY AS ABLE Recommended continue insulin as prescribed and encouraged compliance with sliding scale; Decrease in coreg-BP x3 was 100/60s--discussed with PCP decrease to coreg 12.'5mg'$  twice daily Updated RX for pill packaging Encouraged patient to ensure BP recheck follow up and cardiology follow up PCP f/u appt for 12/18  Hyperlipidemia -encouraged continued compliance with medications Lipid Panel     Component Value Date/Time   CHOL 119 01/07/2022 1059   CHOL 220 (H) 10/29/2012 1729   TRIG 100 01/07/2022 1059   TRIG 200 (H) 01/21/2015 1051   TRIG 179 (H) 10/29/2012 1729   HDL 40 01/07/2022 1059   HDL 63 01/21/2015 1051   HDL 68 10/29/2012 1729   CHOLHDL 3.0 01/07/2022 1059   CHOLHDL 2.5 05/19/2021 1040   VLDL 12 05/19/2021 1040   LDLCALC 60 01/07/2022  1059   LDLCALC 116 (H) 10/29/2012 1729   LABVLDL 19 01/07/2022 1059    Patient Goals/Self-Care Activities patient will:  - take medications as prescribed as evidenced by patient report and record review check glucose continuously using CGM/libre, document, and provide at future appointments target a minimum of 150 minutes of moderate intensity exercise weekly engage in dietary modifications by FOLLOWING A HEART HEALTHY DIET/HEALTHY PLATE METHOD    Subjective: Greg Robertson is an 71 y.o. year old male who is a primary patient of Dettinger, Fransisca Kaufmann, MD.  The patient was referred to the Chronic Care Management team for assistance with care management needs subsequent to provider initiation of CCM services and plan of care.    Engaged with patient face to face for initial visit in response to provider referral for CCM services.   Objective:  LABS:   Lab Results  Component Value Date   CREATININE 1.68 (H) 01/07/2022   CREATININE 1.45 (H) 12/17/2021   CREATININE 1.73 (H) 08/31/2021     Lab Results  Component Value Date   HGBA1C 8.8 (H) 01/07/2022         Component Value Date/Time   CHOL 119 01/07/2022 1059   CHOL 220 (H) 10/29/2012 1729   TRIG 100 01/07/2022 1059   TRIG 200 (H) 01/21/2015 1051   TRIG 179 (H) 10/29/2012 1729   HDL 40 01/07/2022 1059   HDL 63 01/21/2015 1051   HDL 68 10/29/2012 1729   CHOLHDL 3.0 01/07/2022 1059   CHOLHDL 2.5 05/19/2021 1040   VLDL 12 05/19/2021 1040   LDLCALC 60 01/07/2022 1059   LDLCALC 116 (H) 10/29/2012  1729     Clinical ASCVD: Yes   The ASCVD Risk score (Arnett DK, et al., 2019) failed to calculate for the following reasons:   The patient has a prior MI or stroke diagnosis    Other: (CHADS2VASc if Afib, PHQ9 if depression, MMRC or CAT for COPD, ACT, DEXA)    BP Readings from Last 3 Encounters:  01/07/22 108/62  12/17/21 (!) 90/50  11/24/21 124/69      SDOH:  (Social Determinants of Health) assessments and  interventions performed:    Allergies  Allergen Reactions   Oxycodone Nausea And Vomiting    Medications Reviewed Today     Reviewed by Lavera Guise, [facility] (Pharmacist) on 02/24/22 at [place] List Status: <None>   Medication Order Taking? Sig Documenting Provider Last Dose Status Informant  acetaminophen (TYLENOL) 500 MG tablet [id number] No Take 1,000 mg by mouth every 6 (six) hours as needed for mild pain or headache. Pain [provider] Taking Active Nursing Home Medication Administration Guide (MAG)           Med Note (DAVIS, SOPHIA A   Fri Nov 24, 2017  8:23 AM)    atorvastatin (LIPITOR) 40 MG tablet [id number] No Take 1 tablet (40 mg total) by mouth daily. Larey Dresser, MD Taking Active   carvedilol (COREG) 12.5 MG tablet [id number] No Take 1.5 tablets (18.75 mg total) by mouth 2 (two) times daily with a meal. Larey Dresser, MD Taking Active   Cholecalciferol 50 MCG (2000 UT) TABS [id number] No Take 2 tablets by mouth daily. [provider] Taking Active   Continuous Blood Gluc Sensor (FREESTYLE LIBRE 2 SENSOR) MISC [id number] No 1 each by Does not apply route every 14 (fourteen) days Dettinger, Fransisca Kaufmann, MD Taking Active Nursing Home Medication Administration Guide (MAG)           Med Note [facility], PHILICIA R   Wed Oct 07, 2020 10:58 AM)    EPINEPHrine 0.3 mg/0.3 mL IJ SOAJ injection [id number] No Inject 0.3 mg into the muscle as needed for anaphylaxis. Dettinger, Fransisca Kaufmann, MD Taking Active   furosemide (LASIX) 40 MG tablet [id number] No Take 1 tablet (40 mg total) by mouth daily in the afternoon. Larey Dresser, MD Taking Active   glucose blood test strip [id number] No Use as instructed Barton Dubois, MD Taking Active Nursing Home Medication Administration Guide (MAG)  insulin aspart (NOVOLOG) 100 UNIT/ML FlexPen [id number] No Inject 10-20 Units into the skin 3 (three) times daily with meals. Dettinger, Fransisca Kaufmann, MD Taking Active   insulin degludec (TRESIBA) 100  UNIT/ML FlexTouch Pen [id number] No Inject 16-20 Units into the skin daily. [provider] Taking Active   Insulin Pen Needle 32G X 4 MM MISC [id number] No Use as directed Dettinger, Fransisca Kaufmann, MD Taking Active   Lancets MISC [id number] No 1 Lancet by Does not apply route 3 (three) times daily. Dettinger, Fransisca Kaufmann, MD Taking Active   levocetirizine (XYZAL) 5 MG tablet [id number] No Take 1 tablet (5 mg total) by mouth daily. Dettinger, Fransisca Kaufmann, MD Taking Active   Multiple Vitamin (MULTIVITAMIN WITH MINERALS) TABS tablet [id number] No Take 1 tablet by mouth daily. (0800) [provider] Taking Active Nursing Home Medication Administration Guide (MAG)  rivaroxaban (XARELTO) 20 MG TABS tablet [id number] No TAKE ONE TABLET BY MOUTH EVERY EVENING WITH SUPPER Larey Dresser, MD Taking Active   sacubitril-valsartan (ENTRESTO) 97-103 MG [id number] No Take 1 tablet by mouth 2 (two) times  daily. Larey Dresser, MD Taking Active   sodium zirconium cyclosilicate (LOKELMA) 10 g PACK packet [id number] No Take 10 g by mouth daily. Larey Dresser, MD Taking Active   spironolactone (ALDACTONE) 25 MG tablet [id number] No Take 0.5 tablets (12.5 mg total) by mouth daily. Larey Dresser, MD Taking Active   triamcinolone (NASACORT) 55 MCG/ACT AERO nasal inhaler [id number] No INSTILL 1 SPRAY IN EACH NOSTRIL ONCE OR TWICE DAILY AS NEEDED. Chipper Herb, MD Taking Active Nursing Home Medication Administration Guide (MAG)           Med Note [facility]   Fri Dec 17, 2021  2:06 PM)    vitamin C (ASCORBIC ACID) 500 MG tablet [id number] No Take 500 mg by mouth in the morning. (0800) [provider] Taking Active Nursing Home Medication Administration Guide (MAG)  Med List Note Marylynn Pearson 12/07/20 1011): Patient is a resident of [facility] [place]              Goals Addressed               This Visit's Progress     Patient Stated     T1DM, HTN, HLD  PHARMD GOAL (pt-stated)        Current Barriers:  Unable to achieve control of T1DM, HTN  Unable to maintain control of T1DM, HTN  Pharmacist Clinical Goal(s):  patient will achieve control of T1DM as evidenced by IMPROVED GLYCEMIC CONTROL;normal BP maintain control of T1DM as evidenced by [place]  through collaboration with PharmD and provider.    Interventions: 1:1 collaboration with Dettinger, Fransisca Kaufmann, MD regarding development and update of comprehensive plan of care as evidenced by provider attestation and co-signature Inter-disciplinary care team collaboration (see longitudinal plan of care) Comprehensive medication review performed; medication list updated in electronic medical record  Diabetes: New goal. Uncontrolled-A1C 8.8%, GFR 43, BP soft; current treatment:basal/bolus;  Tresiba 16-20 taking 18 units every morning (very brittle) FBG 88 this AM Novolog 10-20 units 3 times daily (avg 1u per 6 grams of carbs)--not compliant with scale  Patient not utilizing bolus insulin due to concern for hypoglycemia Libre 2 CGM samples given for patient to retry the system (used previously, but reports malfunctioning reader/system)  Lab Results  Component Value Date   HGBA1C 8.8 (H) 01/07/2022  Using relion brand [employer] glucometer Utilizing pill packaging from [place] all mediations and purpose  Current glucose readings: fasting glucose: 80-100s, post prandial glucose: varies up to 200s Discussed meal planning options and Plate method for healthy eating Avoid sugary drinks and desserts Incorporate balanced protein, non starchy veggies, 1 serving of carbohydrate with each meal Increase water intake Increase physical activity as able Current exercise: ENCOURAGED ONLY AS ABLE Recommended continue insulin as prescribed and encouraged compliance with sliding scale; Decrease in coreg-BP x3 was 100/60s--discussed with PCP decrease to coreg 12.'5mg'$  twice  daily Updated RX for pill packaging Encouraged patient to ensure BP recheck follow up and cardiology follow up PCP f/u appt for 12/18  Hyperlipidemia -encouraged continued compliance with medications Lipid Panel     Component Value Date/Time   CHOL 119 01/07/2022 1059   CHOL 220 (H) 10/29/2012 1729   TRIG 100 01/07/2022 1059   TRIG 200 (H) 01/21/2015 1051   TRIG 179 (H) 10/29/2012 1729   HDL 40 01/07/2022 1059   HDL 63 01/21/2015 1051   HDL 68 10/29/2012 1729   CHOLHDL 3.0 01/07/2022 1059  CHOLHDL 2.5 05/19/2021 1040   VLDL 12 05/19/2021 1040   LDLCALC 60 01/07/2022 1059   LDLCALC 116 (H) 10/29/2012 1729   LABVLDL 19 01/07/2022 1059   Patient Goals/Self-Care Activities patient will:  - take medications as prescribed as evidenced by patient report and record review check glucose continuously using CGM/libre, document, and provide at future appointments target a minimum of 150 minutes of moderate intensity exercise weekly engage in dietary modifications by FOLLOWING A HEART HEALTHY DIET/HEALTHY PLATE METHOD          Plan: Next PCP appointment scheduled for:  04/04/22    Regina Eck, PharmD, BCPS Clinical Pharmacist, [place]  II Phone [phone]

## 2022-03-01 DIAGNOSIS — E113512 Type 2 diabetes mellitus with proliferative diabetic retinopathy with macular edema, left eye: Secondary | ICD-10-CM | POA: Diagnosis not present

## 2022-03-01 DIAGNOSIS — H401134 Primary open-angle glaucoma, bilateral, indeterminate stage: Secondary | ICD-10-CM | POA: Diagnosis not present

## 2022-03-01 DIAGNOSIS — Z794 Long term (current) use of insulin: Secondary | ICD-10-CM | POA: Diagnosis not present

## 2022-03-01 DIAGNOSIS — H02834 Dermatochalasis of left upper eyelid: Secondary | ICD-10-CM | POA: Diagnosis not present

## 2022-03-01 DIAGNOSIS — H348112 Central retinal vein occlusion, right eye, stable: Secondary | ICD-10-CM | POA: Diagnosis not present

## 2022-03-01 DIAGNOSIS — Z961 Presence of intraocular lens: Secondary | ICD-10-CM | POA: Diagnosis not present

## 2022-03-01 DIAGNOSIS — E113591 Type 2 diabetes mellitus with proliferative diabetic retinopathy without macular edema, right eye: Secondary | ICD-10-CM | POA: Diagnosis not present

## 2022-03-01 DIAGNOSIS — H53462 Homonymous bilateral field defects, left side: Secondary | ICD-10-CM | POA: Diagnosis not present

## 2022-03-01 DIAGNOSIS — H40033 Anatomical narrow angle, bilateral: Secondary | ICD-10-CM | POA: Diagnosis not present

## 2022-03-01 DIAGNOSIS — H02831 Dermatochalasis of right upper eyelid: Secondary | ICD-10-CM | POA: Diagnosis not present

## 2022-03-03 MED ORDER — CARVEDILOL 12.5 MG PO TABS: 12.5000 mg | ORAL_TABLET | Freq: Two times a day (BID) | ORAL | 3 refills | Status: DC

## 2022-03-03 NOTE — Patient Instructions (Addendum)
Visit Information  Following are the goals we discussed today:  Current Barriers:  Unable to achieve control of T2DM, HTN  Unable to maintain control of T2DM, HTN  Pharmacist Clinical Goal(s):  patient will achieve control of T2DM as evidenced by IMPROVED GLYCEMIC CONTROL;normal BP maintain control of T2DM as evidenced by IMPROVED GLYCEMIC CONTROL  through collaboration with PharmD and provider.    Interventions: 1:1 collaboration with Dettinger, Fransisca Kaufmann, MD regarding development and update of comprehensive plan of care as evidenced by provider attestation and co-signature Inter-disciplinary care team collaboration (see longitudinal plan of care) Comprehensive medication review performed; medication list updated in electronic medical record  Diabetes: New goal. Uncontrolled-A1C 8.8%, GFR 43, BP soft; current treatment:basal/bolus;  Tresiba 16-20 taking 18 units every morning (very brittle) FBG 88 this AM Novolog 10-20 units 3 times daily (avg 1u per 6 grams of carbs)--not compliant with scale  Patient not utilizing bolus insulin due to concern for hypoglycemia Libre 2 CGM samples given for patient to retry the system (used previously, but reports malfunctioning reader/system)  Lab Results  Component Value Date   HGBA1C 8.8 (H) 01/07/2022  Using relion brand [employer] glucometer Utilizing pill packaging from [place] all mediations and purpose  Current glucose readings: fasting glucose: 80-100s, post prandial glucose: varies up to 200s Discussed meal planning options and Plate method for healthy eating Avoid sugary drinks and desserts Incorporate balanced protein, non starchy veggies, 1 serving of carbohydrate with each meal Increase water intake Increase physical activity as able Current exercise: ENCOURAGED ONLY AS ABLE Recommended continue insulin as prescribed and encouraged compliance with sliding scale; Decrease in coreg-BP x3 was 100/60s--discussed with  PCP decrease to coreg 12.'5mg'$  twice daily Updated RX for pill packaging Encouraged patient to ensure BP recheck follow up and cardiology follow up PCP f/u appt for 12/18  Hyperlipidemia -encouraged continued compliance with medications Lipid Panel     Component Value Date/Time   CHOL 119 01/07/2022 1059   CHOL 220 (H) 10/29/2012 1729   TRIG 100 01/07/2022 1059   TRIG 200 (H) 01/21/2015 1051   TRIG 179 (H) 10/29/2012 1729   HDL 40 01/07/2022 1059   HDL 63 01/21/2015 1051   HDL 68 10/29/2012 1729   CHOLHDL 3.0 01/07/2022 1059   CHOLHDL 2.5 05/19/2021 1040   VLDL 12 05/19/2021 1040   LDLCALC 60 01/07/2022 1059   LDLCALC 116 (H) 10/29/2012 1729   LABVLDL 19 01/07/2022 1059    Patient Goals/Self-Care Activities patient will:  - take medications as prescribed as evidenced by patient report and record review check glucose continuously using CGM/libre, document, and provide at future appointments target a minimum of 150 minutes of moderate intensity exercise weekly engage in dietary modifications by FOLLOWING A HEART HEALTHY DIET/HEALTHY PLATE METHOD    Plan: Telephone follow up appointment with care management team member scheduled for:  3 months  Signature Regina Eck, PharmD, BCPS Clinical Pharmacist, [place]  II Phone [phone]   Please call the care guide team at [phone] if you need to cancel or reschedule your appointment.   The patient verbalized understanding of instructions, educational materials, and care plan provided today and DECLINED offer to receive copy of patient instructions, educational materials, and care plan.

## 2022-03-04 ENCOUNTER — Encounter: Payer: Self-pay | Admitting: Pharmacist

## 2022-03-14 ENCOUNTER — Encounter (INDEPENDENT_AMBULATORY_CARE_PROVIDER_SITE_OTHER): Payer: Medicare Other | Admitting: Ophthalmology

## 2022-03-14 DIAGNOSIS — H401133 Primary open-angle glaucoma, bilateral, severe stage: Secondary | ICD-10-CM | POA: Diagnosis not present

## 2022-03-14 DIAGNOSIS — H348112 Central retinal vein occlusion, right eye, stable: Secondary | ICD-10-CM | POA: Diagnosis not present

## 2022-03-14 DIAGNOSIS — H353122 Nonexudative age-related macular degeneration, left eye, intermediate dry stage: Secondary | ICD-10-CM | POA: Diagnosis not present

## 2022-03-14 DIAGNOSIS — H353114 Nonexudative age-related macular degeneration, right eye, advanced atrophic with subfoveal involvement: Secondary | ICD-10-CM | POA: Diagnosis not present

## 2022-03-14 DIAGNOSIS — E103492 Type 1 diabetes mellitus with severe nonproliferative diabetic retinopathy without macular edema, left eye: Secondary | ICD-10-CM | POA: Diagnosis not present

## 2022-03-14 LAB — HM DIABETES EYE EXAM

## 2022-03-17 DIAGNOSIS — E785 Hyperlipidemia, unspecified: Secondary | ICD-10-CM

## 2022-03-17 DIAGNOSIS — E1169 Type 2 diabetes mellitus with other specified complication: Secondary | ICD-10-CM

## 2022-03-23 ENCOUNTER — Ambulatory Visit (INDEPENDENT_AMBULATORY_CARE_PROVIDER_SITE_OTHER): Payer: Medicare Other | Admitting: *Deleted

## 2022-03-23 DIAGNOSIS — I480 Paroxysmal atrial fibrillation: Secondary | ICD-10-CM

## 2022-03-23 DIAGNOSIS — E1069 Type 1 diabetes mellitus with other specified complication: Secondary | ICD-10-CM

## 2022-03-23 NOTE — Patient Instructions (Signed)
Please call the care guide team at [phone] if you need to cancel or reschedule your appointment.   If you are experiencing a Mental Health or [place] or need someone to talk to, please call the Suicide and Crisis Lifeline: 988 call the [place] National Suicide Prevention Lifeline: 614-374-2453 or TTY: 310 061 4712 TTY 7275575430) to talk to a trained counselor call 1-800-273-TALK (toll free, 24 hour hotline) go to [facility] [phone]) call the [facility]: [phone] call 911   Following is a copy of your full provider care plan:   Goals Addressed             This Visit's Progress    CCM (ATRIAL FIBRILLATION) EXPECTED OUTCOME: MONITOR, SELF-MANAGE AND REDUCE SYMPTOMS OF ATRIAL FIBRILLATION       Current Barriers:  Knowledge Deficits related to Atrial Fibrillation Chronic Disease Management support and education needs related to Atrial Fibrillation management Patient reports no new issues with Atrial Fibrillation symptoms/ exacerbation, reports takes all medications as prescribed.  Planned Interventions: Counseled on increased risk of stroke due to Afib and benefits of anticoagulation for stroke prevention           Reviewed importance of adherence to anticoagulant exactly as prescribed Counseled on bleeding risk associated with Atrial Fibrillation and taking blood thinner and importance of self-monitoring for signs/symptoms of bleeding Counseled on importance of regular laboratory monitoring as prescribed Afib action plan reviewed Screening for signs and symptoms of depression related to chronic disease state Assessed social determinant of health barriers Atrial Fibrillation action plan reviewed  Symptom Management: Take medications as prescribed   Attend all scheduled provider appointments Call pharmacy for medication refills 3-7 days in advance of running out of  medications Attend church or other social activities Perform all self care activities independently  Perform IADL's (shopping, preparing meals, housekeeping, managing finances) independently Call provider office for new concerns or questions  - check pulse (heart) rate once a day - make a plan to exercise regularly - make a plan to eat healthy - keep all lab appointments - take medicine as prescribed Follow Atrial Fibrillation action plan Continue exercising- keep up the good work!  Follow Up Plan: Telephone follow up appointment with care management team member scheduled for: 05/24/22 at 1045 am       CCM (DIABETES) EXPECTED OUTCOME:  MONITOR, SELF-MANAGE AND REDUCE SYMPTOMS OF DIABETES       Current Barriers:  Knowledge Deficits related to Diabetes management Chronic Disease Management support and education needs related to Diabetes and diet Patient reports he lives alone, has friends he can call on if needed, continues to drive, is independent with all aspects of his care, no longer uses CGM Freestyle [place] because of inaccuracy, pt reports he checks CBG BID with fasting ranges 100's and some readings of 200, 300, random ranges around 200, can have occasional 300, pt reports he has been "brittle" for many years, states the slightest medication changes causes swings in blood sugars, states no recent hypoglycemic episodes Patient reports he exercises at the gym several times per week, does not follow a special diet, does not drink sugary soft drinks  Planned Interventions: Reviewed medications with patient and discussed importance of medication adherence;        Reviewed prescribed diet with patient carbohydrate modified, plate method; Discussed plans with patient for ongoing care management follow up and provided patient with direct contact information for care management team;  Advised patient, providing education and rationale, to check cbg twice daily and record        Review of  patient status, including review of consultants reports, relevant laboratory and other test results, and medications completed;       Reviewed CBG log with patient Reviewed importance of nutritious food choices, healthy carbs with fiber  Symptom Management: Take medications as prescribed   Attend all scheduled provider appointments Call pharmacy for medication refills 3-7 days in advance of running out of medications Attend church or other social activities Perform all self care activities independently  Perform IADL's (shopping, preparing meals, housekeeping, managing finances) independently Call provider office for new concerns or questions  check blood sugar at prescribed times: twice daily check feet daily for cuts, sores or redness enter blood sugar readings and medication or insulin into daily log take the blood sugar log to all doctor visits take the blood sugar meter to all doctor visits trim toenails straight across eat fish at least once per week fill half of plate with vegetables limit fast food meals to no more than 1 per week manage portion size read food labels for fat, fiber, carbohydrates and portion size wash and dry feet carefully every day Choose healthy carbohydrates, (vegetables, whole grain foods with fiber) Keep glucose tablets, snacks, juice on hand for hypoglycemia Continue working out at the gym  Follow Up Plan: Telephone follow up appointment with care management team member scheduled for: 05/24/22 at 1045 am          The patient verbalized understanding of instructions, educational materials, and care plan provided today and DECLINED offer to receive copy of patient instructions, educational materials, and care plan.   Telephone follow up appointment with care management team member scheduled for:  05/24/22 at 1045 am

## 2022-03-23 NOTE — Chronic Care Management (AMB) (Signed)
Chronic Care Management   CCM RN Visit Note  03/23/2022 Name: Greg Robertson MRN: [id number] DOB: 1950/10/29  Subjective: Greg Robertson is a 71 y.o. year old male who is a primary care patient of Dettinger, Fransisca Kaufmann, MD. The patient was referred to the Chronic Care Management team for assistance with care management needs subsequent to provider initiation of CCM services and plan of care.    Today's Visit:  Engaged with patient by telephone for follow up visit.        Goals Addressed             This Visit's Progress    CCM (ATRIAL FIBRILLATION) EXPECTED OUTCOME: MONITOR, SELF-MANAGE AND REDUCE SYMPTOMS OF ATRIAL FIBRILLATION       Current Barriers:  Knowledge Deficits related to Atrial Fibrillation Chronic Disease Management support and education needs related to Atrial Fibrillation management Patient reports no new issues with Atrial Fibrillation symptoms/ exacerbation, reports takes all medications as prescribed.  Planned Interventions: Counseled on increased risk of stroke due to Afib and benefits of anticoagulation for stroke prevention           Reviewed importance of adherence to anticoagulant exactly as prescribed Counseled on bleeding risk associated with Atrial Fibrillation and taking blood thinner and importance of self-monitoring for signs/symptoms of bleeding Counseled on importance of regular laboratory monitoring as prescribed Afib action plan reviewed Screening for signs and symptoms of depression related to chronic disease state Assessed social determinant of health barriers Atrial Fibrillation action plan reviewed  Symptom Management: Take medications as prescribed   Attend all scheduled provider appointments Call pharmacy for medication refills 3-7 days in advance of running out of medications Attend church or other social activities Perform all self care activities independently  Perform IADL's (shopping, preparing meals, housekeeping, managing  finances) independently Call provider office for new concerns or questions  - check pulse (heart) rate once a day - make a plan to exercise regularly - make a plan to eat healthy - keep all lab appointments - take medicine as prescribed Follow Atrial Fibrillation action plan Continue exercising- keep up the good work!  Follow Up Plan: Telephone follow up appointment with care management team member scheduled for: 05/24/22 at 1045 am       CCM (DIABETES) EXPECTED OUTCOME:  MONITOR, SELF-MANAGE AND REDUCE SYMPTOMS OF DIABETES       Current Barriers:  Knowledge Deficits related to Diabetes management Chronic Disease Management support and education needs related to Diabetes and diet Patient reports he lives alone, has friends he can call on if needed, continues to drive, is independent with all aspects of his care, no longer uses CGM Freestyle [place] because of inaccuracy, pt reports he checks CBG BID with fasting ranges 100's and some readings of 200, 300, random ranges around 200, can have occasional 300, pt reports he has been "brittle" for many years, states the slightest medication changes causes swings in blood sugars, states no recent hypoglycemic episodes Patient reports he exercises at the gym several times per week, does not follow a special diet, does not drink sugary soft drinks  Planned Interventions: Reviewed medications with patient and discussed importance of medication adherence;        Reviewed prescribed diet with patient carbohydrate modified, plate method; Discussed plans with patient for ongoing care management follow up and provided patient with direct contact information for care management team;      Advised patient, providing education and rationale, to check cbg twice daily and  record        Review of patient status, including review of consultants reports, relevant laboratory and other test results, and medications completed;       Reviewed CBG log with  patient Reviewed importance of nutritious food choices, healthy carbs with fiber  Symptom Management: Take medications as prescribed   Attend all scheduled provider appointments Call pharmacy for medication refills 3-7 days in advance of running out of medications Attend church or other social activities Perform all self care activities independently  Perform IADL's (shopping, preparing meals, housekeeping, managing finances) independently Call provider office for new concerns or questions  check blood sugar at prescribed times: twice daily check feet daily for cuts, sores or redness enter blood sugar readings and medication or insulin into daily log take the blood sugar log to all doctor visits take the blood sugar meter to all doctor visits trim toenails straight across eat fish at least once per week fill half of plate with vegetables limit fast food meals to no more than 1 per week manage portion size read food labels for fat, fiber, carbohydrates and portion size wash and dry feet carefully every day Choose healthy carbohydrates, (vegetables, whole grain foods with fiber) Keep glucose tablets, snacks, juice on hand for hypoglycemia Continue working out at the gym  Follow Up Plan: Telephone follow up appointment with care management team member scheduled for: 05/24/22 at 1045 am          Plan:Telephone follow up appointment with care management team member scheduled for:  05/24/22 at [place] am  Jacqlyn Larsen [facility], BSN RN Case Manager [place] [phone]

## 2022-03-24 ENCOUNTER — Encounter (HOSPITAL_COMMUNITY): Payer: Self-pay

## 2022-03-24 ENCOUNTER — Other Ambulatory Visit (HOSPITAL_COMMUNITY): Payer: Self-pay

## 2022-03-24 ENCOUNTER — Ambulatory Visit (HOSPITAL_COMMUNITY)
Admission: RE | Admit: 2022-03-24 | Discharge: 2022-03-24 | Disposition: A | Discharge status: Home / Self Care | Payer: Medicare Other | Source: Ambulatory Visit | Attending: Family Medicine | Admitting: Family Medicine

## 2022-03-24 ENCOUNTER — Telehealth (HOSPITAL_COMMUNITY): Payer: Self-pay

## 2022-03-24 VITALS — BP 96/52 | HR 67 | Wt 190.6 lb

## 2022-03-24 DIAGNOSIS — E785 Hyperlipidemia, unspecified: Secondary | ICD-10-CM | POA: Insufficient documentation

## 2022-03-24 DIAGNOSIS — I480 Paroxysmal atrial fibrillation: Secondary | ICD-10-CM | POA: Insufficient documentation

## 2022-03-24 DIAGNOSIS — I428 Other cardiomyopathies: Secondary | ICD-10-CM | POA: Insufficient documentation

## 2022-03-24 DIAGNOSIS — Z79899 Other long term (current) drug therapy: Secondary | ICD-10-CM | POA: Insufficient documentation

## 2022-03-24 DIAGNOSIS — I2510 Atherosclerotic heart disease of native coronary artery without angina pectoris: Secondary | ICD-10-CM | POA: Insufficient documentation

## 2022-03-24 DIAGNOSIS — I5022 Chronic systolic (congestive) heart failure: Secondary | ICD-10-CM | POA: Diagnosis not present

## 2022-03-24 DIAGNOSIS — I130 Hypertensive heart and chronic kidney disease with heart failure and stage 1 through stage 4 chronic kidney disease, or unspecified chronic kidney disease: Secondary | ICD-10-CM | POA: Diagnosis not present

## 2022-03-24 DIAGNOSIS — E1022 Type 1 diabetes mellitus with diabetic chronic kidney disease: Secondary | ICD-10-CM | POA: Diagnosis not present

## 2022-03-24 DIAGNOSIS — Z7901 Long term (current) use of anticoagulants: Secondary | ICD-10-CM | POA: Insufficient documentation

## 2022-03-24 DIAGNOSIS — Z8673 Personal history of transient ischemic attack (TIA), and cerebral infarction without residual deficits: Secondary | ICD-10-CM | POA: Insufficient documentation

## 2022-03-24 DIAGNOSIS — I5042 Chronic combined systolic (congestive) and diastolic (congestive) heart failure: Secondary | ICD-10-CM | POA: Diagnosis not present

## 2022-03-24 DIAGNOSIS — Z794 Long term (current) use of insulin: Secondary | ICD-10-CM | POA: Diagnosis not present

## 2022-03-24 LAB — CBC
HCT: 39.7 % (ref 39.0–52.0)
Hemoglobin: 12.8 g/dL — ABNORMAL LOW (ref 13.0–17.0)
MCH: 29.6 pg (ref 26.0–34.0)
MCHC: 32.2 g/dL (ref 30.0–36.0)
MCV: 91.7 fL (ref 80.0–100.0)
Platelets: 177 10*3/uL (ref 150–400)
RBC: 4.33 MIL/uL (ref 4.22–5.81)
RDW: 13.2 % (ref 11.5–15.5)
WBC: 7.9 10*3/uL (ref 4.0–10.5)
nRBC: 0 % (ref 0.0–0.2)

## 2022-03-24 LAB — COMPREHENSIVE METABOLIC PANEL
ALT: 14 U/L (ref 0–44)
AST: 19 U/L (ref 15–41)
Albumin: 3.9 g/dL (ref 3.5–5.0)
Alkaline Phosphatase: 81 U/L (ref 38–126)
Anion gap: 8 (ref 5–15)
BUN: 39 mg/dL — ABNORMAL HIGH (ref 8–23)
CO2: 25 mmol/L (ref 22–32)
Calcium: 9.1 mg/dL (ref 8.9–10.3)
Chloride: 106 mmol/L (ref 98–111)
Creatinine, Ser: 1.6 mg/dL — ABNORMAL HIGH (ref 0.61–1.24)
GFR, Estimated: 46 mL/min — ABNORMAL LOW (ref >60)
Glucose, Bld: 59 mg/dL — ABNORMAL LOW (ref 70–99)
Potassium: 5 mmol/L (ref 3.5–5.1)
Sodium: 139 mmol/L (ref 135–145)
Total Bilirubin: 0.6 mg/dL (ref 0.3–1.2)
Total Protein: 6.1 g/dL — ABNORMAL LOW (ref 6.5–8.1)

## 2022-03-24 LAB — LIPID PANEL
Cholesterol: 170 mg/dL (ref 0–200)
HDL: 52 mg/dL (ref >40)
LDL Cholesterol: 103 mg/dL — ABNORMAL HIGH (ref 0–99)
Total CHOL/HDL Ratio: 3.3 RATIO
Triglycerides: 77 mg/dL (ref <150)
VLDL: 15 mg/dL (ref 0–40)

## 2022-03-24 NOTE — Patient Instructions (Addendum)
Thank you for coming in today  Labs were done today, if any labs are abnormal the clinic will call you No news is good news  Your physician recommends that you schedule a follow-up appointment in:  3 months with Dr. Mclean    Do the following things EVERYDAY: Weigh yourself in the morning before breakfast. Write it down and keep it in a log. Take your medicines as prescribed Eat low salt foods--Limit salt (sodium) to 2000 mg per day.  Stay as active as you can everyday Limit all fluids for the day to less than 2 liters  At the [facility], you and your health needs are our priority. As part of our continuing mission to provide you with exceptional heart care, we have created designated Provider Care Teams. These Care Teams include your primary Cardiologist (physician) and Advanced Practice Providers (APPs- Physician Assistants and Nurse Practitioners) who all work together to provide you with the care you need, when you need it.   You may see any of the following providers on your designated Care Team at your next follow up: Dr Daniel Bensimhon Dr Dalton McLean Dr. Aditya Sabharwal Amy Clegg, NP Brittainy Simmons, PA Jessica Milford,NP Lindsay Finch, PA Alma Diaz, NP Lauren Kemp, PharmD   Please be sure to bring in all your medications bottles to every appointment.   If you have any questions or concerns before your next appointment please send us a message through mychart or call our office at [phone].    TO LEAVE A MESSAGE FOR THE NURSE SELECT OPTION 2, PLEASE LEAVE A MESSAGE INCLUDING: YOUR NAME DATE OF BIRTH CALL BACK NUMBER REASON FOR CALL**this is important as we prioritize the call backs  YOU WILL RECEIVE A CALL BACK THE SAME DAY AS LONG AS YOU CALL BEFORE 4:00 PM  

## 2022-03-24 NOTE — Telephone Encounter (Signed)
Advanced Heart Failure Patient Advocate Encounter  Application for [facility] faxed to [employer] on 03/24/22. Application form attached to patient chart.

## 2022-03-24 NOTE — Progress Notes (Signed)
Advanced [facility] Progress Note    Patient ID: KAVIR SAVOCA, male   DOB: 1950-05-01, 71 y.o.   MRN: [id number] PCP: Dettinger, Fransisca Kaufmann, MD Cardiology: Dr. Aundra Dubin  Reason for Visit: Routine F/u for Systolic Heart Failure   71 y.o. with history of HTN, type I diabetes, and paroxysmal atrial fibrillation/CVA was referred by Dr. Shan Levans for evaluation of cardiomyopathy.  Patient also has had colon cancer with ileocolectomy in 2/13.  In 11/13, he fell and broke his clavicle (tripped).  Soon after this, he was admitted with DKA and profound dehydration/metabolic derangement.  He was noted to be in atrial fibrillation with RVR.  His cardiac enzymes were elevated, troponin peaked at 1.57. He initially went back into NSR but then had recurrent episodes of atrial fibrillation.  He was also started on Eliquis due to risk of embolic CVA, however he subsequently stopped Eliquis.   He was admitted in 6/22 with right PCA CVA and hemorrhagic conversion.  Initially just started on ASA, later this was stopped and Xarelto started.  Main residual from CVA is mild left upper visual field impairment.   CTA neck showed < 50% bilateral carotid stenosis.  Echo showed EF 30-35%, diffuse hypokinesis.  RHC/LHC in 8/22 showed nonobstructive CAD and normal filling pressures.   Cardiac MRI in 11/22 showed LV EF 38%, RVEF 46%, basal inferolateral mid-wall LGE. Diffuse LGE images.  ECV 37-46%.  Based on cMRI, differential included prior myocarditis and cardiac amyloidosis.  PYP scan was done in 2/23, showing grade 1-2, H/CL 1.25.  Invitae gene testing for TTR mutations was negative. Based on full picture, TTR amyloidosis felt unlikely.   Patient returns for followup of CHF.  Here w/ his wife. Doing well. No complaints. NYHA Class II. Denies CP. No palpitations. No wt gain, LEE, orthopnea or PND. Reports full med compliance. BP soft but no orthostatic symptoms. Denies abnormal bleeding w/ Xarelto.    ECG: NSR 73  bpm (personally reviewed)  Labs (12/13): TSH normal, HCT 39.1, LFTs normal, K 4.2, creatinine 1.1 Labs (7/22): K 5.6, creatinine 1.17 Labs (8/22): BNP 283, LDL 67, HDL 58, K 5.3, creatinine 1.34 Labs (10/22): K 5.1, creatinine 1.24 Labs (11/22): urine immunofixation negative, K 4.9, creatinine 1.37 Labs (2/23): K 4.1, creatinine 1.33, myeloma panel negative, LDL 68, TGs 58, hgb 12.8 Labs (5/23): K 4.8, creatinine 1.73, BNP 128 Labs (9/23): Hgb 12.3, creatinine 1.68, K 4.7  PMH: 1. HTN 2. Type I diabetes 3. Anemia, thrombocytopenia, leukopenia: related to chemotherapy.  4. Clavicular fracture 11/13 due to mechanical fall.  5. 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  FOLFOX chemotherapy 3/13 - 9/13.  7. Allergic rhinitis 8. Paroxysmal atrial fibrillation: First diagnosed in 11/13 during admission for DKA. Echo (11/13): EF 55% with grade I diastolic dysfunction.  9. Right central retinal artery occlusion 10. 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  - CTA neck with <50% carotid stenosis.  11. Cardiomyopathy: Nonischemic cardiomyopathy.  Echo in 6/22 with EF 30-35%, global hypokinesis, normal RV.   - RHC/LHC (8/22): 40% ostial/proximal LAD, D1 50%.  Mean RA 2, PA 29/9, mean PCWP 8, CI 2.94.  - Cardiac MRI (11/22): LV EF 38%, RVEF 46%, basal inferolateral mid-wall LGE. - PYP (2/23) showing grade 1-2, H/CL 1.25.  The PYP scan was equivocal.  12. COVID-19 2/23 13. OSA 14. CKD stage 3: Diabetic nephropathy  SH: Lives in [place].  Quit smoking in 1995.  H/o marijuana.  Quit drinking ETOH  in 2022.    FH: No atrial fibrillation or CAD.   ROS: All systems reviewed and negative except as per HPI.   Current Outpatient Medications  Medication Sig Dispense Refill   atorvastatin (LIPITOR) 40 MG tablet Take 1 tablet (40 mg total) by mouth daily. 30 tablet 3   carvedilol (COREG) 12.5 MG tablet Take 1 tablet (12.5 mg total) by mouth 2 (two) times daily with a  meal. Dose change-updated dispill 180 tablet 3   Cholecalciferol 50 MCG (2000 UT) TABS Take 2 tablets by mouth daily.     Continuous Blood Gluc Sensor (FREESTYLE LIBRE 2 SENSOR) MISC 1 each by Does not apply route every 14 (fourteen) days 6 each 3   furosemide (LASIX) 40 MG tablet Take 1 tablet (40 mg total) by mouth daily in the afternoon. 90 tablet 3   glucose blood test strip Use as instructed 100 each 12   insulin aspart (NOVOLOG) 100 UNIT/ML FlexPen Inject 10-20 Units into the skin 3 (three) times daily with meals. 15 mL 2   insulin degludec (TRESIBA) 100 UNIT/ML FlexTouch Pen Inject 16-20 Units into the skin daily.     Insulin Pen Needle 32G X 4 MM MISC Use as directed 150 each 2   Lancets MISC 1 Lancet by Does not apply route 3 (three) times daily. 100 each 3   levocetirizine (XYZAL) 5 MG tablet Take 1 tablet (5 mg total) by mouth daily. 90 tablet 3   Multiple Vitamin (MULTIVITAMIN WITH MINERALS) TABS tablet Take 1 tablet by mouth daily. (0800)     rivaroxaban (XARELTO) 20 MG TABS tablet TAKE ONE TABLET BY MOUTH EVERY EVENING WITH SUPPER 30 tablet 11   sacubitril-valsartan (ENTRESTO) 97-103 MG Take 1 tablet by mouth 2 (two) times daily. 180 tablet 3   sodium zirconium cyclosilicate (LOKELMA) 10 g PACK packet Take 10 g by mouth daily. 90 packet 3   spironolactone (ALDACTONE) 25 MG tablet Take 0.5 tablets (12.5 mg total) by mouth daily. 45 tablet 3   triamcinolone (NASACORT) 55 MCG/ACT AERO nasal inhaler INSTILL 1 SPRAY IN EACH NOSTRIL ONCE OR TWICE DAILY AS NEEDED. 16.5 Bottle 11   vitamin C (ASCORBIC ACID) 500 MG tablet Take 500 mg by mouth in the morning. (0800)     acetaminophen (TYLENOL) 500 MG tablet Take 1,000 mg by mouth every 6 (six) hours as needed for mild pain or headache. Pain (Patient not taking: Reported on 03/24/2022)     EPINEPHrine 0.3 mg/0.3 mL IJ SOAJ injection Inject 0.3 mg into the muscle as needed for anaphylaxis. (Patient not taking: Reported on 03/24/2022) 1 each 1    No current facility-administered medications for this encounter.    BP (!) 96/52   Pulse 67   Wt 86.5 kg (190 lb 9.6 oz)   SpO2 100%   BMI 24.47 kg/m  PHYSICAL EXAM: General:  Well appearing. No respiratory difficulty HEENT: normal Neck: supple. no JVD. Carotids 2+ bilat; no bruits. No lymphadenopathy or thyromegaly appreciated. Cor: PMI nondisplaced. Regular rate & rhythm. No rubs, gallops or murmurs. Lungs: clear Abdomen: soft, nontender, nondistended. No hepatosplenomegaly. No bruits or masses. Good bowel sounds. Extremities: no cyanosis, clubbing, rash, edema Neuro: alert & oriented x 3, cranial nerves grossly intact. moves all 4 extremities w/o difficulty. Affect pleasant.   Assessment/Plan: 1. Atrial fibrillation:  Paroxysmal. Denies breakthrough symptoms.  He is in NSR today.  - continue Coreg 12.5 mg bid  - Continue Xarelto 20 mg daily.  Denies abnormal bleeding. Check CBC  today  - If atrial fibrillation recurs frequently, could use anti-arrhythmic versus ablation.  2. Chronic systolic CHF: Nonischemic cardiomyopathy.  Echo in 6/22 at time of CVA showed EF 30-35%, diffuse hypokinesis.  LHC/RHC in 8/22 showed nonobstructive CAD and normal filling pressures.  Cardiac MRI in 11/22 showed LV EF 38%, RVEF 46%, basal inferolateral mid-wall LGE.  Possible prior viral myocarditis based on MRI, cannot rule out cardiac amyloidosis.  PYP scan was equivocal but likely negative, [employer]ae gene testing negative for transthyretin mutation, myeloma panel was negative => suspect TTR cardiac amyloidosis is unlikely.  NYHA class I-II. Euvolemic on exam. - Continue Coreg  12.5 mg bid. BP too soft for dose titration.  - Continue spironolactone 12.5 mg daily. On Low dose given h/o hyperkalemia. He is on daily Lokelma to control K. Check CMP today.  - Continue Entresto 97/103 bid.  - No SGLT2 inhibitor with type 1 diabetes.  - Continue Lasix 40 mg daily.  - EF out of range for ICD on 11/22 cMRI.   Narrow QRS so not CRT candidate.  3. Hyperlipidemia: On atorvastatin.  - Check Lipid panel and LFTs today  4. CAD: Nonobstructive on cath 8/22.  - denies CP  - Continue statin. LP today  - No ASA given Xarelto use.   F/u w/ Dr. Aundra Dubin in 3-4 months  Lyda Jester, PA-C  03/24/2022

## 2022-03-28 NOTE — Telephone Encounter (Signed)
Advanced Heart Failure Patient Advocate Encounter   Patient was approved to receive Lokelma from [employer]  Effective dates: 04/18/22 through 04/18/23  Document scanned to chart.   Charlann Boxer, CPhT

## 2022-04-04 ENCOUNTER — Ambulatory Visit: Payer: Medicare Other | Admitting: Family Medicine

## 2022-04-12 ENCOUNTER — Other Ambulatory Visit: Payer: Self-pay | Admitting: Family Medicine

## 2022-04-17 DIAGNOSIS — I4891 Unspecified atrial fibrillation: Secondary | ICD-10-CM

## 2022-04-17 DIAGNOSIS — E1159 Type 2 diabetes mellitus with other circulatory complications: Secondary | ICD-10-CM

## 2022-04-20 ENCOUNTER — Ambulatory Visit (INDEPENDENT_AMBULATORY_CARE_PROVIDER_SITE_OTHER): Payer: Medicare Other | Admitting: Family Medicine

## 2022-04-20 ENCOUNTER — Encounter: Payer: Self-pay | Admitting: Family Medicine

## 2022-04-20 VITALS — BP 92/55 | HR 72 | Temp 97.1°F | Ht 74.0 in | Wt 190.0 lb

## 2022-04-20 DIAGNOSIS — E103393 Type 1 diabetes mellitus with moderate nonproliferative diabetic retinopathy without macular edema, bilateral: Secondary | ICD-10-CM | POA: Diagnosis not present

## 2022-04-20 DIAGNOSIS — E1059 Type 1 diabetes mellitus with other circulatory complications: Secondary | ICD-10-CM | POA: Diagnosis not present

## 2022-04-20 DIAGNOSIS — E1069 Type 1 diabetes mellitus with other specified complication: Secondary | ICD-10-CM

## 2022-04-20 DIAGNOSIS — Z23 Encounter for immunization: Secondary | ICD-10-CM

## 2022-04-20 DIAGNOSIS — E1159 Type 2 diabetes mellitus with other circulatory complications: Secondary | ICD-10-CM

## 2022-04-20 DIAGNOSIS — I152 Hypertension secondary to endocrine disorders: Secondary | ICD-10-CM | POA: Diagnosis not present

## 2022-04-20 DIAGNOSIS — E785 Hyperlipidemia, unspecified: Secondary | ICD-10-CM

## 2022-04-20 LAB — BAYER DCA HB A1C WAIVED: HB A1C (BAYER DCA - WAIVED): 12.1 % — ABNORMAL HIGH (ref 4.8–5.6)

## 2022-04-20 NOTE — Patient Instructions (Signed)
Increased Tresiba at 20 units daily  Decrease NovoLog to 1 unit per 8 g of carbs with meals

## 2022-04-20 NOTE — Progress Notes (Signed)
BP (!) 92/55   Pulse 72   Temp (!) 97.1 F (36.2 C)   Ht _0  (1.88 m)   Wt 190 lb (86.2 kg)   SpO2 97%   BMI 24.39 kg/m    Subjective:   Patient ID: Greg Robertson, male    DOB: 07-04-1950, 72 y.o.   MRN: [id number]  HPI: Greg Robertson is a 72 y.o. male presenting on 04/20/2022 for Medical Management of Chronic Issues and Diabetes   HPI Type 1 diabetes mellitus Patient comes in today for recheck of his diabetes. Patient has been currently taking Tresiba 18 units and NovoLog carb counting 20 to 30 units per meal. Patient is currently on an ACE inhibitor/ARB. Patient has not seen an ophthalmologist this year. Patient denies any issues with their feet. The symptom started onset as an adult hypertension and hyperlipidemia ARE RELATED TO DM   Hypertension and CHF and CAD Patient is currently on carvedilol and furosemide and Entresto and spironolactone, and their blood pressure today is 92/55. Patient denies any lightheadedness or dizziness. Patient denies headaches, blurred vision, chest pains, shortness of breath, or weakness. Denies any side effects from medication and is content with current medication.   Hyperlipidemia Patient is coming in for recheck of his hyperlipidemia. The patient is currently taking atorvastatin. They deny any issues with myalgias or history of liver damage from it. They deny any focal numbness or weakness or chest pain.   Relevant past medical, surgical, family and social history reviewed and updated as indicated. Interim medical history since our last visit reviewed. Allergies and medications reviewed and updated.  Review of Systems  Constitutional:  Negative for chills and fever.  Eyes:  Negative for visual disturbance.  Respiratory:  Negative for shortness of breath and wheezing.   Cardiovascular:  Negative for chest pain and leg swelling.  Musculoskeletal:  Negative for back pain and gait problem.  Skin:  Negative for rash.  Neurological:   Negative for dizziness, weakness and light-headedness.  All other systems reviewed and are negative.   Per HPI unless specifically indicated above   Allergies as of 04/20/2022       Reactions   Oxycodone Nausea And Vomiting        Medication List        Accurate as of April 20, 2022 12:07 PM. If you have any questions, ask your nurse or doctor.          acetaminophen 500 MG tablet Commonly known as: TYLENOL Take 1,000 mg by mouth every 6 (six) hours as needed for mild pain or headache. Pain   ascorbic acid 500 MG tablet Commonly known as: VITAMIN C Take 500 mg by mouth in the morning. (0800)   atorvastatin 40 MG tablet Commonly known as: LIPITOR Take 1 tablet (40 mg total) by mouth daily.   carvedilol 12.5 MG tablet Commonly known as: COREG Take 1 tablet (12.5 mg total) by mouth 2 (two) times daily with a meal. Dose change-updated dispill   Cholecalciferol 50 MCG (2000 UT) Tabs Take 2 tablets by mouth daily.   Entresto 97-103 MG Generic drug: sacubitril-valsartan Take 1 tablet by mouth 2 (two) times daily.   EPINEPHrine 0.3 mg/0.3 mL Soaj injection Commonly known as: EPI-PEN Inject 0.3 mg into the muscle as needed for anaphylaxis.   FreeStyle Libre 2 Sensor Misc 1 each by Does not apply route every 14 (fourteen) days   furosemide 40 MG tablet Commonly known as: LASIX Take 1 tablet (40  mg total) by mouth daily in the afternoon.   glucose blood test strip Use as instructed   insulin degludec 100 UNIT/ML FlexTouch Pen Commonly known as: TRESIBA Inject 16-20 Units into the skin daily.   Insulin Pen Needle 32G X 4 MM Misc Use as directed   Lancets Misc 1 Lancet by Does not apply route 3 (three) times daily.   levocetirizine 5 MG tablet Commonly known as: XYZAL Take 1 tablet (5 mg total) by mouth daily.   Lokelma 10 g Pack packet Generic drug: sodium zirconium cyclosilicate Take 10 g by mouth daily.   multivitamin with minerals Tabs  tablet Take 1 tablet by mouth daily. (0800)   NovoLOG FlexPen 100 UNIT/ML FlexPen Generic drug: insulin aspart INJECT 20-30 UNITS INTO THE SKIN THREE TIMES DAILY WITH MEALS   rivaroxaban 20 MG Tabs tablet Commonly known as: Xarelto TAKE ONE TABLET BY MOUTH EVERY EVENING WITH SUPPER   spironolactone 25 MG tablet Commonly known as: ALDACTONE Take 0.5 tablets (12.5 mg total) by mouth daily.   triamcinolone 55 MCG/ACT Aero nasal inhaler Commonly known as: NASACORT INSTILL 1 SPRAY IN EACH NOSTRIL ONCE OR TWICE DAILY AS NEEDED.         Objective:   BP (!) 92/55   Pulse 72   Temp (!) 97.1 F (36.2 C)   Ht _0  (1.88 m)   Wt 190 lb (86.2 kg)   SpO2 97%   BMI 24.39 kg/m   Wt Readings from Last 3 Encounters:  04/20/22 190 lb (86.2 kg)  03/24/22 190 lb 9.6 oz (86.5 kg)  02/22/22 190 lb (86.2 kg)    Physical Exam Vitals and nursing note reviewed.  Constitutional:      General: He is not in acute distress.    Appearance: He is well-developed. He is not diaphoretic.  Eyes:     General: No scleral icterus.    Conjunctiva/sclera: Conjunctivae normal.  Neck:     Thyroid: No thyromegaly.  Cardiovascular:     Rate and Rhythm: Normal rate and regular rhythm.     Heart sounds: Normal heart sounds. No murmur heard. Pulmonary:     Effort: Pulmonary effort is normal. No respiratory distress.     Breath sounds: Normal breath sounds. No wheezing.  Musculoskeletal:        General: No swelling. Normal range of motion.     Cervical back: Neck supple.  Lymphadenopathy:     Cervical: No cervical adenopathy.  Skin:    General: Skin is warm and dry.     Findings: No rash.  Neurological:     Mental Status: He is alert and oriented to person, place, and time.     Coordination: Coordination normal.  Psychiatric:        Behavior: Behavior normal.     Results for orders placed or performed in visit on 03/30/22  HM DIABETES EYE EXAM  Result Value Ref Range   HM Diabetic Eye Exam  Retinopathy (A) No Retinopathy    Assessment & Plan:   Problem List Items Addressed This Visit       Cardiovascular and Mediastinum   Hypertension associated with diabetes ([place]) (Chronic)   Relevant Orders   CBC with Differential/Platelet   CMP14+EGFR   Lipid panel   Bayer DCA Hb A1c Waived     Endocrine   DM type 1 (diabetes mellitus, type 1) (HCC) (Chronic)   Hyperlipidemia due to type 1 diabetes mellitus ([place])   Relevant Orders   CBC with  Differential/Platelet   CMP14+EGFR   Lipid panel   Bayer DCA Hb A1c Waived   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  Follow up plan: Return in about 3 months (around 07/20/2022), or if symptoms worsen or fail to improve, for Diabetes recheck.  Counseling provided for all of the vaccine components Orders Placed This Encounter  Procedures   CBC with Differential/Platelet   CMP14+EGFR   Lipid panel   Bayer DCA Hb A1c Waived    Caryl Pina, MD [facility] 04/20/2022, 12:07 PM

## 2022-04-21 ENCOUNTER — Other Ambulatory Visit: Payer: Self-pay

## 2022-04-21 LAB — CMP14+EGFR
ALT: 18 IU/L (ref 0–44)
AST: 18 IU/L (ref 0–40)
Albumin/Globulin Ratio: 2.6 — ABNORMAL HIGH (ref 1.2–2.2)
Albumin: 4.1 g/dL (ref 3.8–4.8)
Alkaline Phosphatase: 146 IU/L — ABNORMAL HIGH (ref 44–121)
BUN/Creatinine Ratio: 28 — ABNORMAL HIGH (ref 10–24)
BUN: 46 mg/dL — ABNORMAL HIGH (ref 8–27)
Bilirubin Total: 0.2 mg/dL (ref 0.0–1.2)
CO2: 22 mmol/L (ref 20–29)
Calcium: 8.6 mg/dL (ref 8.6–10.2)
Chloride: 99 mmol/L (ref 96–106)
Creatinine, Ser: 1.63 mg/dL — ABNORMAL HIGH (ref 0.76–1.27)
Globulin, Total: 1.6 g/dL (ref 1.5–4.5)
Glucose: 467 mg/dL (ref 70–99)
Potassium: 5.5 mmol/L — ABNORMAL HIGH (ref 3.5–5.2)
Sodium: 134 mmol/L (ref 134–144)
Total Protein: 5.7 g/dL — ABNORMAL LOW (ref 6.0–8.5)
eGFR: 45 mL/min/{1.73_m2} — ABNORMAL LOW (ref >59)

## 2022-04-21 LAB — CBC WITH DIFFERENTIAL/PLATELET
Basophils Absolute: 0 10*3/uL (ref 0.0–0.2)
Basos: 1 %
EOS (ABSOLUTE): 0.2 10*3/uL (ref 0.0–0.4)
Eos: 3 %
Hematocrit: 37.6 % (ref 37.5–51.0)
Hemoglobin: 12 g/dL — ABNORMAL LOW (ref 13.0–17.7)
Immature Grans (Abs): 0 10*3/uL (ref 0.0–0.1)
Immature Granulocytes: 0 %
Lymphocytes Absolute: 1.5 10*3/uL (ref 0.7–3.1)
Lymphs: 26 %
MCH: 29.7 pg (ref 26.6–33.0)
MCHC: 31.9 g/dL (ref 31.5–35.7)
MCV: 93 fL (ref 79–97)
Monocytes Absolute: 0.6 10*3/uL (ref 0.1–0.9)
Monocytes: 11 %
Neutrophils Absolute: 3.5 10*3/uL (ref 1.4–7.0)
Neutrophils: 59 %
Platelets: 149 10*3/uL — ABNORMAL LOW (ref 150–450)
RBC: 4.04 x10E6/uL — ABNORMAL LOW (ref 4.14–5.80)
RDW: 12.5 % (ref 11.6–15.4)
WBC: 5.8 10*3/uL (ref 3.4–10.8)

## 2022-04-21 LAB — LIPID PANEL
Chol/HDL Ratio: 3.8 ratio (ref 0.0–5.0)
Cholesterol, Total: 173 mg/dL (ref 100–199)
HDL: 45 mg/dL (ref >39)
LDL Chol Calc (NIH): 102 mg/dL — ABNORMAL HIGH (ref 0–99)
Triglycerides: 149 mg/dL (ref 0–149)
VLDL Cholesterol Cal: 26 mg/dL (ref 5–40)

## 2022-04-21 MED ORDER — ATORVASTATIN CALCIUM 80 MG PO TABS: 80.0000 mg | ORAL_TABLET | Freq: Every day | ORAL | 3 refills | Status: DC

## 2022-05-03 NOTE — Addendum Note (Signed)
Addended by: Alphonzo Dublin on: 05/03/2022 11:34 AM   Modules accepted: Orders

## 2022-05-09 ENCOUNTER — Other Ambulatory Visit: Payer: Self-pay | Admitting: Family Medicine

## 2022-05-09 ENCOUNTER — Other Ambulatory Visit (HOSPITAL_COMMUNITY): Payer: Self-pay | Admitting: Cardiology

## 2022-05-24 ENCOUNTER — Telehealth: Payer: Self-pay

## 2022-05-24 ENCOUNTER — Ambulatory Visit (INDEPENDENT_AMBULATORY_CARE_PROVIDER_SITE_OTHER): Payer: Medicare Other | Admitting: *Deleted

## 2022-05-24 DIAGNOSIS — I480 Paroxysmal atrial fibrillation: Secondary | ICD-10-CM

## 2022-05-24 DIAGNOSIS — E1069 Type 1 diabetes mellitus with other specified complication: Secondary | ICD-10-CM

## 2022-05-24 NOTE — Patient Instructions (Signed)
Please call the care guide team at [phone] if you need to cancel or reschedule your appointment.   If you are experiencing a Mental Health or [place] or need someone to talk to, please call the Suicide and Crisis Lifeline: 988 call the [place] National Suicide Prevention Lifeline: (437)516-0051 or TTY: 813 623 2138 TTY 310-298-4116) to talk to a trained counselor call 1-800-273-TALK (toll free, 24 hour hotline) go to [facility] [address], [place] [phone]) call the [facility]: [phone] call 911   Following is a copy of the CCM Program Consent:  CCM service includes personalized support from designated clinical staff supervised by the physician, including individualized plan of care and coordination with other care providers 24/7 contact phone numbers for assistance for urgent and routine care needs. Service will only be billed when office clinical staff spend 20 minutes or more in a month to coordinate care. Only one practitioner may furnish and bill the service in a calendar month. The patient may stop CCM services at amy time (effective at the end of the month) by phone call to the office staff. The patient will be responsible for cost sharing (co-pay) or up to 20% of the service fee (after annual deductible is met)  Following is a copy of your full provider care plan:   Goals Addressed             This Visit's Progress    CCM (ATRIAL FIBRILLATION) EXPECTED OUTCOME: MONITOR, SELF-MANAGE AND REDUCE SYMPTOMS OF ATRIAL FIBRILLATION       Current Barriers:  Knowledge Deficits related to Atrial Fibrillation Chronic Disease Management support and education needs related to Atrial Fibrillation management Patient reports no new issues with Atrial Fibrillation symptoms/ exacerbation, reports takes all medications as prescribed. Patient reports he has been walking daily outside (weather  permitting)  Planned Interventions: Counseled on increased risk of stroke due to Afib and benefits of anticoagulation for stroke prevention           Reviewed importance of adherence to anticoagulant exactly as prescribed Counseled on bleeding risk associated with Atrial Fibrillation and taking blood thinner and importance of self-monitoring for signs/symptoms of bleeding Counseled on importance of regular laboratory monitoring as prescribed Afib action plan reviewed Atrial Fibrillation action plan reinforced  Symptom Management: Take medications as prescribed   Attend all scheduled provider appointments Call pharmacy for medication refills 3-7 days in advance of running out of medications Attend church or other social activities Perform all self care activities independently  Perform IADL's (shopping, preparing meals, housekeeping, managing finances) independently Call provider office for new concerns or questions  - check pulse (heart) rate once a day - make a plan to exercise regularly - make a plan to eat healthy - keep all lab appointments - take medicine as prescribed Follow Atrial Fibrillation action plan Continue exercising/ walking - keep up the good work!  Follow Up Plan: Telephone follow up appointment with care management team member scheduled for: 08/01/22 at 11 am       CCM (DIABETES) EXPECTED OUTCOME:  MONITOR, SELF-MANAGE AND REDUCE SYMPTOMS OF DIABETES       Current Barriers:  Knowledge Deficits related to Diabetes management Chronic Disease Management support and education needs related to Diabetes and diet Patient reports he lives alone, has friends he can call on if needed, continues to drive, is independent with all aspects of his care, no longer uses CGM Freestyle [place] because of inaccuracy, pt reports he checks  CBG BID with fasting ranges 200- 300, random ranges around 180-200, can have occasional 300, pt reports he has been "brittle" for many years, states the  slightest medication changes causes swings in blood sugars, states has had 2-3 hypoglycemic episodes in the past month with readings of 40-50 Patient reports he exercises at the gym (or walks) several times per week, does not follow a special diet, does not drink sugary soft drinks Patient states he would like to speak with pharmacist about alternative to [facility] as pt likes the monitor that alerts him of hypoglycemia, pt states he does not trust the readings  Planned Interventions: Reviewed medications with patient and discussed importance of medication adherence;        Reviewed prescribed diet with patient carbohydrate modified, plate method; Discussed plans with patient for ongoing care management follow up and provided patient with direct contact information for care management team;      Advised patient, providing education and rationale, to check cbg twice daily and record        Review of patient status, including review of consultants reports, relevant laboratory and other test results, and medications completed;       Advised patient to discuss any issues with blood sugar, hypoglycemia  with provider;      Reviewed CBG log with patient Reinforced importance of nutritious food choices, protein, healthy carbs with fiber In basket message sent to pharmacist Lottie Dawson who reports pt would like to speak with her about alternative to [facility]  Symptom Management: Take medications as prescribed   Attend all scheduled provider appointments Call pharmacy for medication refills 3-7 days in advance of running out of medications Attend church or other social activities Perform all self care activities independently  Perform IADL's (shopping, preparing meals, housekeeping, managing finances) independently Call provider office for new concerns or questions  check blood sugar at prescribed times: twice daily check feet daily for cuts, sores or redness enter blood sugar  readings and medication or insulin into daily log take the blood sugar log to all doctor visits take the blood sugar meter to all doctor visits trim toenails straight across eat fish at least once per week fill half of plate with vegetables limit fast food meals to no more than 1 per week manage portion size prepare main meal at home 3 to 5 days each week read food labels for fat, fiber, carbohydrates and portion size wash and dry feet carefully every day Choose healthy carbohydrates, (vegetables, whole grain foods with fiber) Keep glucose tablets, snacks, juice on hand for hypoglycemia Continue walking daily  Follow Up Plan: Telephone follow up appointment with care management team member scheduled for: 08/01/22 at 11 am          The patient verbalized understanding of instructions, educational materials, and care plan provided today and DECLINED offer to receive copy of patient instructions, educational materials, and care plan.   Telephone follow up appointment with care management team member scheduled for:  08/01/22 at 11 am

## 2022-05-24 NOTE — Progress Notes (Signed)
  Care Management   Outreach Note  05/24/2022 Name: Greg Robertson MRN: [id number] DOB: 05/13/1950  An unsuccessful telephone outreach was attempted today to contact the patient about Chronic Care Management needs.    Follow Up Plan:  A HIPAA compliant phone message was left for the patient providing contact information and requesting a return call.  The care management team will reach out to the patient again over the next 7 days.  If patient returns call to provider office, please advise to call [place] * at [phone] Larsson, [place], [place] [address] Direct Dial: [phone] [email or web address]

## 2022-05-24 NOTE — Chronic Care Management (AMB) (Signed)
Chronic Care Management   CCM RN Visit Note  05/24/2022 Name: Greg Robertson MRN: [id number] DOB: [birth year]  Subjective: Greg Robertson is a 72 y.o. year old male who is a primary care patient of Dettinger, Fransisca Kaufmann, MD. The patient was referred to the Chronic Care Management team for assistance with care management needs subsequent to provider initiation of CCM services and plan of care.    Today's Visit:  Engaged with patient by telephone for follow up visit.        Goals Addressed             This Visit's Progress    CCM (ATRIAL FIBRILLATION) EXPECTED OUTCOME: MONITOR, SELF-MANAGE AND REDUCE SYMPTOMS OF ATRIAL FIBRILLATION       Current Barriers:  Knowledge Deficits related to Atrial Fibrillation Chronic Disease Management support and education needs related to Atrial Fibrillation management Patient reports no new issues with Atrial Fibrillation symptoms/ exacerbation, reports takes all medications as prescribed. Patient reports he has been walking daily outside (weather permitting)  Planned Interventions: Counseled on increased risk of stroke due to Afib and benefits of anticoagulation for stroke prevention           Reviewed importance of adherence to anticoagulant exactly as prescribed Counseled on bleeding risk associated with Atrial Fibrillation and taking blood thinner and importance of self-monitoring for signs/symptoms of bleeding Counseled on importance of regular laboratory monitoring as prescribed Afib action plan reviewed Atrial Fibrillation action plan reinforced  Symptom Management: Take medications as prescribed   Attend all scheduled provider appointments Call pharmacy for medication refills 3-7 days in advance of running out of medications Attend church or other social activities Perform all self care activities independently  Perform IADL's (shopping, preparing meals, housekeeping, managing finances) independently Call provider office for new  concerns or questions  - check pulse (heart) rate once a day - make a plan to exercise regularly - make a plan to eat healthy - keep all lab appointments - take medicine as prescribed Follow Atrial Fibrillation action plan Continue exercising/ walking - keep up the good work!  Follow Up Plan: Telephone follow up appointment with care management team member scheduled for: 08/01/22 at 11 am       CCM (DIABETES) EXPECTED OUTCOME:  MONITOR, SELF-MANAGE AND REDUCE SYMPTOMS OF DIABETES       Current Barriers:  Knowledge Deficits related to Diabetes management Chronic Disease Management support and education needs related to Diabetes and diet Patient reports he lives alone, has friends he can call on if needed, continues to drive, is independent with all aspects of his care, no longer uses CGM Freestyle [place] because of inaccuracy, pt reports he checks CBG BID with fasting ranges 200- 300, random ranges around 180-200, can have occasional 300, pt reports he has been "brittle" for many years, states the slightest medication changes causes swings in blood sugars, states has had 2-3 hypoglycemic episodes in the past month with readings of 40-50 Patient reports he exercises at the gym (or walks) several times per week, does not follow a special diet, does not drink sugary soft drinks Patient states he would like to speak with pharmacist about alternative to [facility] 2 as pt likes the monitor that alerts him of hypoglycemia, pt states he does not trust the readings  Planned Interventions: Reviewed medications with patient and discussed importance of medication adherence;        Reviewed prescribed diet with patient carbohydrate modified, plate method; Discussed plans with patient for  ongoing care management follow up and provided patient with direct contact information for care management team;      Advised patient, providing education and rationale, to check cbg twice daily and record         Review of patient status, including review of consultants reports, relevant laboratory and other test results, and medications completed;       Advised patient to discuss any issues with blood sugar, hypoglycemia  with provider;      Reviewed CBG log with patient Reinforced importance of nutritious food choices, protein, healthy carbs with fiber In basket message sent to pharmacist Lottie Dawson who reports pt would like to speak with her about alternative to [facility]  Symptom Management: Take medications as prescribed   Attend all scheduled provider appointments Call pharmacy for medication refills 3-7 days in advance of running out of medications Attend church or other social activities Perform all self care activities independently  Perform IADL's (shopping, preparing meals, housekeeping, managing finances) independently Call provider office for new concerns or questions  check blood sugar at prescribed times: twice daily check feet daily for cuts, sores or redness enter blood sugar readings and medication or insulin into daily log take the blood sugar log to all doctor visits take the blood sugar meter to all doctor visits trim toenails straight across eat fish at least once per week fill half of plate with vegetables limit fast food meals to no more than 1 per week manage portion size prepare main meal at home 3 to 5 days each week read food labels for fat, fiber, carbohydrates and portion size wash and dry feet carefully every day Choose healthy carbohydrates, (vegetables, whole grain foods with fiber) Keep glucose tablets, snacks, juice on hand for hypoglycemia Continue walking daily  Follow Up Plan: Telephone follow up appointment with care management team member scheduled for: 08/01/22 at 11 am          Plan:Telephone follow up appointment with care management team member scheduled for:  08/01/22 at 11 am  Jacqlyn Larsen [facility], BSN RN Case Manager [place] [phone]

## 2022-05-25 NOTE — Progress Notes (Signed)
  Care Management   Outreach Note  05/25/2022 Name: Greg Robertson MRN: [id number] DOB: 05-Mar-1951  Second unsuccessful telephone outreach was attempted today to contact the patient about Chronic Care Management needs.    Follow Up Plan:  A HIPAA compliant phone message was left for the patient providing contact information and requesting a return call.  The care management team will reach out to the patient again over the next 7 days.  If patient returns call to provider office, please advise to call [place]* at [phone] Larsson, [place], [place] [address] Direct Dial: [phone] [email or web address]

## 2022-06-02 NOTE — Progress Notes (Signed)
  Chronic Care Management Note  06/02/2022 Name: [name] MRN: [id number] DOB: 1950/09/05  Greg Robertson is a 72 y.o. year old male who is a primary care patient of Dettinger, Fransisca Kaufmann, MD and is actively engaged with the Chronic Care Management team. I reached out to [name] by phone today to assist with re-scheduling a follow up visit with the Pharmacist  Follow up plan: Telephone appointment with care management team member scheduled for:06/28/2022  Noreene Larsson, [place], [place] [address] Direct Dial: [phone] [email or web address]

## 2022-06-07 ENCOUNTER — Other Ambulatory Visit: Payer: Self-pay | Admitting: Family Medicine

## 2022-06-07 ENCOUNTER — Other Ambulatory Visit (HOSPITAL_COMMUNITY): Payer: Self-pay | Admitting: Cardiology

## 2022-06-16 DIAGNOSIS — I4891 Unspecified atrial fibrillation: Secondary | ICD-10-CM | POA: Diagnosis not present

## 2022-06-16 DIAGNOSIS — E1059 Type 1 diabetes mellitus with other circulatory complications: Secondary | ICD-10-CM | POA: Diagnosis not present

## 2022-06-20 ENCOUNTER — Ambulatory Visit (HOSPITAL_COMMUNITY)
Admission: RE | Admit: 2022-06-20 | Discharge: 2022-06-20 | Disposition: A | Discharge status: Home / Self Care | Payer: Medicare Other | Source: Ambulatory Visit | Attending: Cardiology | Admitting: Cardiology

## 2022-06-20 ENCOUNTER — Encounter (HOSPITAL_COMMUNITY): Payer: Self-pay | Admitting: Cardiology

## 2022-06-20 VITALS — BP 90/52 | HR 64 | Ht 74.0 in | Wt 189.6 lb

## 2022-06-20 DIAGNOSIS — Z79899 Other long term (current) drug therapy: Secondary | ICD-10-CM | POA: Diagnosis not present

## 2022-06-20 DIAGNOSIS — N183 Chronic kidney disease, stage 3 unspecified: Secondary | ICD-10-CM | POA: Insufficient documentation

## 2022-06-20 DIAGNOSIS — Z87891 Personal history of nicotine dependence: Secondary | ICD-10-CM | POA: Insufficient documentation

## 2022-06-20 DIAGNOSIS — I480 Paroxysmal atrial fibrillation: Secondary | ICD-10-CM | POA: Diagnosis present

## 2022-06-20 DIAGNOSIS — E1022 Type 1 diabetes mellitus with diabetic chronic kidney disease: Secondary | ICD-10-CM | POA: Insufficient documentation

## 2022-06-20 DIAGNOSIS — Z794 Long term (current) use of insulin: Secondary | ICD-10-CM | POA: Insufficient documentation

## 2022-06-20 DIAGNOSIS — Z8673 Personal history of transient ischemic attack (TIA), and cerebral infarction without residual deficits: Secondary | ICD-10-CM | POA: Diagnosis not present

## 2022-06-20 DIAGNOSIS — I428 Other cardiomyopathies: Secondary | ICD-10-CM | POA: Insufficient documentation

## 2022-06-20 DIAGNOSIS — Z8616 Personal history of COVID-19: Secondary | ICD-10-CM | POA: Insufficient documentation

## 2022-06-20 DIAGNOSIS — I2510 Atherosclerotic heart disease of native coronary artery without angina pectoris: Secondary | ICD-10-CM | POA: Insufficient documentation

## 2022-06-20 DIAGNOSIS — I130 Hypertensive heart and chronic kidney disease with heart failure and stage 1 through stage 4 chronic kidney disease, or unspecified chronic kidney disease: Secondary | ICD-10-CM | POA: Diagnosis not present

## 2022-06-20 DIAGNOSIS — Z85038 Personal history of other malignant neoplasm of large intestine: Secondary | ICD-10-CM | POA: Diagnosis not present

## 2022-06-20 DIAGNOSIS — Z7901 Long term (current) use of anticoagulants: Secondary | ICD-10-CM | POA: Diagnosis not present

## 2022-06-20 DIAGNOSIS — E785 Hyperlipidemia, unspecified: Secondary | ICD-10-CM | POA: Insufficient documentation

## 2022-06-20 DIAGNOSIS — I5022 Chronic systolic (congestive) heart failure: Secondary | ICD-10-CM | POA: Diagnosis not present

## 2022-06-20 LAB — BASIC METABOLIC PANEL
Anion gap: 8 (ref 5–15)
BUN: 27 mg/dL — ABNORMAL HIGH (ref 8–23)
CO2: 25 mmol/L (ref 22–32)
Calcium: 9.1 mg/dL (ref 8.9–10.3)
Chloride: 101 mmol/L (ref 98–111)
Creatinine, Ser: 1.51 mg/dL — ABNORMAL HIGH (ref 0.61–1.24)
GFR, Estimated: 49 mL/min — ABNORMAL LOW (ref >60)
Glucose, Bld: 317 mg/dL — ABNORMAL HIGH (ref 70–99)
Potassium: 4.9 mmol/L (ref 3.5–5.1)
Sodium: 134 mmol/L — ABNORMAL LOW (ref 135–145)

## 2022-06-20 LAB — CBC
HCT: 37.8 % — ABNORMAL LOW (ref 39.0–52.0)
Hemoglobin: 12.6 g/dL — ABNORMAL LOW (ref 13.0–17.0)
MCH: 30 pg (ref 26.0–34.0)
MCHC: 33.3 g/dL (ref 30.0–36.0)
MCV: 90 fL (ref 80.0–100.0)
Platelets: 176 10*3/uL (ref 150–400)
RBC: 4.2 MIL/uL — ABNORMAL LOW (ref 4.22–5.81)
RDW: 12.7 % (ref 11.5–15.5)
WBC: 6.5 10*3/uL (ref 4.0–10.5)
nRBC: 0 % (ref 0.0–0.2)

## 2022-06-20 LAB — LIPID PANEL
Cholesterol: 172 mg/dL (ref 0–200)
HDL: 55 mg/dL (ref >40)
LDL Cholesterol: 104 mg/dL — ABNORMAL HIGH (ref 0–99)
Total CHOL/HDL Ratio: 3.1 RATIO
Triglycerides: 66 mg/dL (ref <150)
VLDL: 13 mg/dL (ref 0–40)

## 2022-06-20 LAB — BRAIN NATRIURETIC PEPTIDE: B Natriuretic Peptide: 154.4 pg/mL — ABNORMAL HIGH (ref 0.0–100.0)

## 2022-06-20 NOTE — Progress Notes (Signed)
[facility] Progress Note    Patient ID: Greg Robertson, male   DOB: 13-May-1950, 72 y.o.   MRN: [id number] PCP: Dettinger, Fransisca Kaufmann, MD Cardiology: Dr. Aundra Dubin  72 y.o. with history of HTN, type I diabetes, and paroxysmal atrial fibrillation/CVA was referred by Dr. Shan Levans for evaluation of cardiomyopathy.  Patient also has had colon cancer with ileocolectomy in 2/13.  In 11/13, he fell and broke his clavicle (tripped).  Soon after this, he was admitted with DKA and profound dehydration/metabolic derangement.  He was noted to be in atrial fibrillation with RVR.  His cardiac enzymes were elevated, troponin peaked at 1.57. He initially went back into NSR but then had recurrent episodes of atrial fibrillation.  He was also started on Eliquis due to risk of embolic CVA, however he subsequently stopped Eliquis.   He was admitted in 6/22 with right PCA CVA and hemorrhagic conversion.  Initially just started on ASA, later this was stopped and Xarelto started.  Main residual from CVA is mild left upper visual field impairment.   CTA neck showed < 50% bilateral carotid stenosis.  Echo showed EF 30-35%, diffuse hypokinesis.  RHC/LHC in 8/22 showed nonobstructive CAD and normal filling pressures.   Cardiac MRI in 11/22 showed LV EF 38%, RVEF 46%, basal inferolateral mid-wall LGE. Diffuse LGE images.  ECV 37-46%.  Based on cMRI, differential included prior myocarditis and cardiac amyloidosis.  PYP scan was done in 2/23, showing grade 1-2, H/CL 1.25.  Invitae gene testing for TTR mutations was negative. Based on full picture, TTR amyloidosis felt unlikely.   Patient returns for followup of CHF.  He is now living at home, no longer in assisted living.  No exertional dyspnea or chest pain.  Glucose still running high at times.  No lightheadedness though SBP 90 today.  No orthopnea/PND.  No BRBPR/melena. Weight down 1 lb.   ECG: NSR with rightward axis (personally reviewed)  Labs (12/13): TSH  normal, HCT 39.1, LFTs normal, K 4.2, creatinine 1.1 Labs (7/22): K 5.6, creatinine 1.17 Labs (8/22): BNP 283, LDL 67, HDL 58, K 5.3, creatinine 1.34 Labs (10/22): K 5.1, creatinine 1.24 Labs (11/22): urine immunofixation negative, K 4.9, creatinine 1.37 Labs (2/23): K 4.1, creatinine 1.33, myeloma panel negative, LDL 68, TGs 58, hgb 12.8 Labs (5/23): K 4.8, creatinine 1.73, BNP 128 Labs (9/23): Hgb 12.3, creatinine 1.68, K 4.7 Labs (1/24): K 5.5, creatinine 1.63, glucose 467  PMH: 1. HTN 2. Type I diabetes 3. Anemia, thrombocytopenia, leukopenia: related to chemotherapy.  4. Clavicular fracture 11/13 due to mechanical fall.  5. 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  FOLFOX chemotherapy 3/13 - 9/13.  7. Allergic rhinitis 8. Paroxysmal atrial fibrillation: First diagnosed in 11/13 during admission for DKA. Echo (11/13): EF 55% with grade I diastolic dysfunction.  9. Right central retinal artery occlusion 10. 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  - CTA neck with <50% carotid stenosis.  11. Cardiomyopathy: Nonischemic cardiomyopathy.  Echo in 6/22 with EF 30-35%, global hypokinesis, normal RV.   - RHC/LHC (8/22): 40% ostial/proximal LAD, D1 50%.  Mean RA 2, PA 29/9, mean PCWP 8, CI 2.94.  - Cardiac MRI (11/22): LV EF 38%, RVEF 46%, basal inferolateral mid-wall LGE. - PYP (2/23) showing grade 1-2, H/CL 1.25.  The PYP scan was equivocal.  12. COVID-19 2/23 13. OSA 14. CKD stage 3: Diabetic nephropathy  SH: Lives in [place].  Quit smoking in 1995.  H/o marijuana.  Quit  drinking ETOH in 2022.    FH: No atrial fibrillation or CAD.   ROS: All systems reviewed and negative except as per HPI.   Current Outpatient Medications  Medication Sig Dispense Refill   atorvastatin (LIPITOR) 80 MG tablet Take 1 tablet (80 mg total) by mouth daily. 90 tablet 3   carvedilol (COREG) 12.5 MG tablet Take 1 tablet (12.5 mg total) by mouth 2 (two) times daily with a  meal. Dose change-updated dispill 180 tablet 3   Cholecalciferol 50 MCG (2000 UT) TABS Take 2 tablets by mouth daily.     Continuous Blood Gluc Sensor (FREESTYLE LIBRE 2 SENSOR) MISC 1 each by Does not apply route every 14 (fourteen) days 6 each 3   EPINEPHrine 0.3 mg/0.3 mL IJ SOAJ injection Inject 0.3 mg into the muscle as needed for anaphylaxis. 1 each 1   furosemide (LASIX) 40 MG tablet Take 1 tablet (40 mg total) by mouth daily in the afternoon. 90 tablet 3   glucose blood test strip Use as instructed 100 each 12   insulin degludec (TRESIBA) 100 UNIT/ML FlexTouch Pen Inject 16-20 Units into the skin daily.     Insulin Pen Needle 32G X 4 MM MISC Use as directed 150 each 2   Lancets MISC 1 Lancet by Does not apply route 3 (three) times daily. 100 each 3   levocetirizine (XYZAL) 5 MG tablet Take 1 tablet (5 mg total) by mouth daily. 90 tablet 3   Multiple Vitamin (MULTIVITAMIN WITH MINERALS) TABS tablet Take 1 tablet by mouth daily. (0800)     NOVOLOG FLEXPEN 100 UNIT/ML FlexPen INJECT 20-30 UNITS INTO THE SKIN THREE TIMES DAILY WITH MEALS 15 mL 0   rivaroxaban (XARELTO) 20 MG TABS tablet TAKE ONE TABLET BY MOUTH EVERY EVENING WITH SUPPER 30 tablet 11   sacubitril-valsartan (ENTRESTO) 97-103 MG Take 1 tablet by mouth 2 (two) times daily. 180 tablet 3   sodium zirconium cyclosilicate (LOKELMA) 10 g PACK packet Take 10 g by mouth daily. 90 packet 3   spironolactone (ALDACTONE) 25 MG tablet Take 0.5 tablets (12.5 mg total) by mouth daily. 45 tablet 3   triamcinolone (NASACORT) 55 MCG/ACT AERO nasal inhaler INSTILL 1 SPRAY IN EACH NOSTRIL ONCE OR TWICE DAILY AS NEEDED. 16.5 Bottle 11   vitamin C (ASCORBIC ACID) 500 MG tablet Take 500 mg by mouth in the morning. (0800)     acetaminophen (TYLENOL) 500 MG tablet Take 1,000 mg by mouth every 6 (six) hours as needed for mild pain or headache. Pain (Patient not taking: Reported on 06/20/2022)     No current facility-administered medications for this  encounter.    BP (!) 90/52   Pulse 64   Ht '6\' 2"'$  (1.88 m)   Wt 86 kg (189 lb 9.6 oz)   SpO2 98%   BMI 24.34 kg/m  PHYSICAL EXAM: General: NAD Neck: No JVD, no thyromegaly or thyroid nodule.  Lungs: Clear to auscultation bilaterally with normal respiratory effort. CV: Nondisplaced PMI.  Heart regular S1/S2, no S3/S4, no murmur.  No peripheral edema.  No carotid bruit.  Normal pedal pulses.  Abdomen: Soft, nontender, no hepatosplenomegaly, no distention.  Skin: Intact without lesions or rashes.  Neurologic: Alert and oriented x 3.  Psych: Normal affect. Extremities: No clubbing or cyanosis.  HEENT: Normal.   Assessment/Plan: 1. Atrial fibrillation:  Paroxysmal. Denies breakthrough symptoms.  He is in NSR today.  - continue Coreg 12.5 mg bid  - Continue Xarelto 20 mg daily. Check CBC  today  - If atrial fibrillation recurs frequently, could use anti-arrhythmic versus ablation.  2. Chronic systolic CHF: Nonischemic cardiomyopathy.  Echo in 6/22 at time of CVA showed EF 30-35%, diffuse hypokinesis.  LHC/RHC in 8/22 showed nonobstructive CAD and normal filling pressures.  Cardiac MRI in 11/22 showed LV EF 38%, RVEF 46%, basal inferolateral mid-wall LGE.  Possible prior viral myocarditis based on MRI, cannot rule out cardiac amyloidosis.  PYP scan was equivocal but likely negative, Invitae gene testing negative for transthyretin mutation, myeloma panel was negative => suspect TTR cardiac amyloidosis is unlikely.  NYHA class I-II symptoms, not volume overloaded by exam.  GDMT titration limited by CKD and soft BP.  - Continue Coreg  12.5 mg bid. BP too soft for dose titration.  - Continue spironolactone 12.5 mg daily. On low dose given h/o hyperkalemia. He is on daily Lokelma 10 g to control K. Check BMET today.  - Continue Entresto 97/103 bid.  - No SGLT2 inhibitor with type 1 diabetes.  - Continue Lasix 40 mg daily.  - EF out of range for ICD on 11/22 cMRI.  Narrow QRS so not CRT candidate.   I will arrange for repeat echo.  3. Hyperlipidemia: On atorvastatin 80 mg daily.  - Check lipids today.  4. CAD: Nonobstructive on cath 8/22. No chest pain.  - Continue statin, check lipids today.  - No ASA given Xarelto use.  5. Type 1 diabetes: Glucose control seems poor.   - I recommended that he seen an endocrinologist.  He did not commit to this but willing to consider.  6. CKD stage 3: Diabetic nephropathy with a tendency towards hyperkalemia.  - I recommended followup with a nephrologist.  Again, he will consider.   Followup in 3 months with APP.   Loralie Champagne,  06/20/2022

## 2022-06-20 NOTE — Patient Instructions (Addendum)
It was great to see you today! No medication changes are needed at this time.   Labs today We will only contact you if something comes back abnormal or we need to make some changes. Otherwise no news is good news!  Your physician has requested that you have an echocardiogram. Echocardiography is a painless test that uses sound waves to create images of your heart. It provides your doctor with information about the size and shape of your heart and how well your heart's chambers and valves are working. This procedure takes approximately one hour. There are no restrictions for this procedure. Please do NOT wear cologne, perfume, aftershave, or lotions (deodorant is allowed). Please arrive 15 minutes prior to your appointment time.  Your physician recommends that you schedule a follow-up appointment in: 3 months  in the Advanced Practitioners (PA/NP) Clinic    Do the following things EVERYDAY: Weigh yourself in the morning before breakfast. Write it down and keep it in a log. Take your medicines as prescribed Eat low salt foods--Limit salt (sodium) to 2000 mg per day.  Stay as active as you can everyday Limit all fluids for the day to less than 2 liters   At the [facility], you and your health needs are our priority. As part of our continuing mission to provide you with exceptional heart care, we have created designated Provider Care Teams. These Care Teams include your primary Cardiologist (physician) and Advanced Practice Providers (APPs- Physician Assistants and Nurse Practitioners) who all work together to provide you with the care you need, when you need it.   You may see any of the following providers on your designated Care Team at your next follow up: Dr Glori Bickers Dr Loralie Champagne Dr. Roxana Hires, NP Lyda Jester, [facility] [place], [place] Forestine Na, NP Audry Riles, PharmD   Please be sure to bring in all your  medications bottles to every appointment.    Thank you for choosing [facility]   If you have any questions or concerns before your next appointment please send [place] a message through [place] or call our office at [phone].    TO LEAVE A MESSAGE FOR THE NURSE SELECT OPTION 2, PLEASE LEAVE A MESSAGE INCLUDING: YOUR NAME DATE OF BIRTH CALL BACK NUMBER REASON FOR CALL**this is important as we prioritize the call backs  YOU WILL RECEIVE A CALL BACK THE SAME DAY AS LONG AS YOU CALL BEFORE 4:00 PM

## 2022-06-28 ENCOUNTER — Ambulatory Visit (INDEPENDENT_AMBULATORY_CARE_PROVIDER_SITE_OTHER): Payer: Medicare Other | Admitting: Pharmacist

## 2022-06-28 DIAGNOSIS — E1059 Type 1 diabetes mellitus with other circulatory complications: Secondary | ICD-10-CM | POA: Diagnosis not present

## 2022-06-28 NOTE — Progress Notes (Signed)
S:    PCP: Dr. Warrick Parisian  72 y.o. male who presents for diabetes evaluation, education, and management.  PMH is significant for T1DM, HLD, HF, atrial fibrillation, stroke in June 2022.  Patient was last seen by Primary Care Provider, Dr. Warrick Parisian, on 04/20/2022.  At last visit, A1c was elevated at 12.1%, up from 8.8%.   Today, patient is in fair spirits. Discussed an endocrinology referral with patient; however, he declined stating he was already seeing a too many doctors. He also notes that he did not like his FreeStyle [place] because it was often giving lower readings than his traditional glucometer, typically at least 50 point difference. Also noted connectivity issues where his reader would give a signal loss alarm when his reader was in his shirt pocket.   Patient reports Diabetes was diagnosed at 72 YO.   Current diabetes medications include: Tresiba 20 units QAM, Novolog 14-20 units TID (1 unit for every 5 g of carbs) Current hypertension medications include: Entresto 97/103 mg BID, Lasix 40 mg daily, carvedilol 12.5 mg BID, spirolactone 12.5 mg daily Current hyperlipidemia medications include: atorvastatin 80 mg daily  Patient reports adherence to taking all medications as prescribed.   Insurance coverage: Medicare  Patient denies hypoglycemic events. However, he did have a BG of 75 a few days ago  Reported home fasting blood sugars: 130-150 occasionally, but mostly 200s. Down from 300s  Reported 2 hour post-meal/random blood sugars: 150s-200s.  Patient reported dietary habits: cut down on fries and potato consumption, trying to read labels more carefully, not eating as much rice, drinks diet sdoa   O:    Lab Results  Component Value Date   HGBA1C 12.1 (H) 04/20/2022   There were no vitals filed for this visit.  Lipid Panel     Component Value Date/Time   CHOL 172 06/20/2022 1105   CHOL 173 04/20/2022 1147   CHOL 220 (H) 10/29/2012 1729   TRIG 66 06/20/2022 1105    TRIG 200 (H) 01/21/2015 1051   TRIG 179 (H) 10/29/2012 1729   HDL 55 06/20/2022 1105   HDL 45 04/20/2022 1147   HDL 63 01/21/2015 1051   HDL 68 10/29/2012 1729   CHOLHDL 3.1 06/20/2022 1105   VLDL 13 06/20/2022 1105   LDLCALC 104 (H) 06/20/2022 1105   LDLCALC 102 (H) 04/20/2022 1147   LDLCALC 116 (H) 10/29/2012 1729    Clinical Atherosclerotic Cardiovascular Disease (ASCVD): Yes - stroke in June 2022 The ASCVD Risk score (Arnett DK, et al., 2019) failed to calculate for the following reasons:   The patient has a prior MI or stroke diagnosis   A/P: Diabetes longstanding currently uncontrolled based on A1c (12.1%). Patient is able to verbalize appropriate hypoglycemia management plan. Appears he has some difficulty calculating how many carbs are in his meals to dose his meal time insulin. He is also adamantly afraid of hypoglycemia, especially nocturnal, but he reports he did not have success with FreeStyle Libre 2. He declines an endocrinology referral. -Continued basal insulin Tresiba (insulin degludec) 20 units daily in the morning.  -Continued rapid insulin  Novolog (insulin aspart) 14-20 units TID (1 unit per 5 grams of carbs). He reports Dr. Warrick Parisian had discussed with him about using 1 unit per 4 grams of carbs. Patient willing to try this to see if he can achieve better blood sugar control.  -Patient educated on purpose, proper use, and potential adverse effects of insulin.  -Extensively discussed pathophysiology of diabetes, recommended lifestyle interventions,  dietary effects on blood sugar control.  -Counseled on s/sx of and management of hypoglycemia.  -Next A1c anticipated April 2024.   ASCVD risk - secondary prevention in patient with diabetes. Last LDL is not at goal of <55 mg/dL. high intensity statin indicated.  -Continued atorvastatin 40- 80 mg daily as tolerated (working on up to 80mg  daily)  Written patient instructions provided. Patient verbalized understanding of  treatment plan.  Total time in face to face counseling 40 minutes.    Follow-up:  Pharmacist - patient would like to schedule in person visit with Almyra Free. PCP clinic visit in April 2024.   Joseph Art, Pharm.D. PGY-2 Ambulatory Care Pharmacy Resident  Regina Eck, PharmD, BCACP Clinical Pharmacist, [facility]

## 2022-06-29 ENCOUNTER — Telehealth: Payer: Self-pay

## 2022-06-29 NOTE — Progress Notes (Signed)
  Chronic Care Management Note  06/29/2022 Name: Greg Robertson MRN: [id number] DOB: [birth year]  Greg Robertson is a 72 y.o. year old male who is a primary care patient of Dettinger, Fransisca Kaufmann, MD and is actively engaged with the Chronic Care Management team. I reached out to [name] by phone today to assist with re-scheduling a follow up visit with the Pharmacist  Follow up plan: Unsuccessful telephone outreach attempt made. A HIPAA compliant phone message was left for the patient providing contact information and requesting a return call.  The care management team will reach out to the patient again over the next 7 days.  If patient returns call to provider office, please advise to call [facility]  at [place], [place], [place] [address] Direct Dial: [phone] [email or web address]

## 2022-07-07 ENCOUNTER — Other Ambulatory Visit: Payer: Self-pay | Admitting: Family Medicine

## 2022-07-07 ENCOUNTER — Other Ambulatory Visit (HOSPITAL_COMMUNITY): Payer: Self-pay | Admitting: Cardiology

## 2022-07-11 ENCOUNTER — Ambulatory Visit (HOSPITAL_COMMUNITY)
Admission: RE | Admit: 2022-07-11 | Discharge: 2022-07-11 | Disposition: A | Discharge status: Home / Self Care | Payer: Medicare Other | Source: Ambulatory Visit | Attending: Family Medicine | Admitting: Family Medicine

## 2022-07-11 DIAGNOSIS — I5022 Chronic systolic (congestive) heart failure: Secondary | ICD-10-CM

## 2022-07-11 DIAGNOSIS — I110 Hypertensive heart disease with heart failure: Secondary | ICD-10-CM | POA: Diagnosis not present

## 2022-07-11 DIAGNOSIS — I509 Heart failure, unspecified: Secondary | ICD-10-CM | POA: Insufficient documentation

## 2022-07-11 DIAGNOSIS — E119 Type 2 diabetes mellitus without complications: Secondary | ICD-10-CM | POA: Insufficient documentation

## 2022-07-11 LAB — ECHOCARDIOGRAM COMPLETE
Area-P 1/2: 3.12 cm2
Calc EF: 53.7 %
S' Lateral: 2.7 cm
Single Plane A2C EF: 56.3 %
Single Plane A4C EF: 50.3 %

## 2022-07-11 NOTE — Progress Notes (Signed)
Echocardiogram 2D Echocardiogram has been performed.  Greg Robertson 07/11/2022, 11:09 AM

## 2022-07-14 NOTE — Progress Notes (Signed)
  Chronic Care Management Note  07/14/2022 Name: Greg Robertson MRN: [id number] DOB: 1950-08-13  Greg Robertson is a 72 y.o. year old male who is a primary care patient of Dettinger, Fransisca Kaufmann, MD and is actively engaged with the Chronic Care Management team. I reached out to [name] by phone today to assist with re-scheduling a follow up visit with the Pharmacist  Follow up plan: Unsuccessful telephone outreach attempt made. A HIPAA compliant phone message was left for the patient providing contact information and requesting a return call.  The care management team will reach out to the patient again over the next 7 days.  If patient returns call to provider office, please advise to call [facility]  at [place], [place], [place] [address] Direct Dial: [phone] [email or web address]

## 2022-07-21 ENCOUNTER — Encounter: Payer: Self-pay | Admitting: Family Medicine

## 2022-07-21 ENCOUNTER — Ambulatory Visit (INDEPENDENT_AMBULATORY_CARE_PROVIDER_SITE_OTHER): Payer: Medicare Other | Admitting: Family Medicine

## 2022-07-21 VITALS — BP 72/43 | HR 82 | Ht 74.0 in | Wt 188.0 lb

## 2022-07-21 DIAGNOSIS — E782 Mixed hyperlipidemia: Secondary | ICD-10-CM | POA: Diagnosis not present

## 2022-07-21 DIAGNOSIS — I10 Essential (primary) hypertension: Secondary | ICD-10-CM

## 2022-07-21 DIAGNOSIS — E1059 Type 1 diabetes mellitus with other circulatory complications: Secondary | ICD-10-CM

## 2022-07-21 DIAGNOSIS — E10319 Type 1 diabetes mellitus with unspecified diabetic retinopathy without macular edema: Secondary | ICD-10-CM | POA: Diagnosis not present

## 2022-07-21 DIAGNOSIS — I152 Hypertension secondary to endocrine disorders: Secondary | ICD-10-CM

## 2022-07-21 DIAGNOSIS — H3500 Unspecified background retinopathy: Secondary | ICD-10-CM | POA: Diagnosis not present

## 2022-07-21 DIAGNOSIS — E785 Hyperlipidemia, unspecified: Secondary | ICD-10-CM

## 2022-07-21 DIAGNOSIS — E1069 Type 1 diabetes mellitus with other specified complication: Secondary | ICD-10-CM

## 2022-07-21 DIAGNOSIS — E1159 Type 2 diabetes mellitus with other circulatory complications: Secondary | ICD-10-CM

## 2022-07-21 LAB — BAYER DCA HB A1C WAIVED: HB A1C (BAYER DCA - WAIVED): 12.3 % — ABNORMAL HIGH (ref 4.8–5.6)

## 2022-07-21 NOTE — Patient Instructions (Signed)
We recommended again that he increase his NovoLog to 1 unit per 4 g of carbs to get better coverage and continue Tresiba 20.  Also recommended that he check his blood sugar before every meal and 1 hour after so we can get a better idea of whether or not he is covered insufficient mealtime insulin  He did not bring his meter today with him so it is hard to assess.  He does want to get an appointment with our pharmacologist to see about getting a new CGM.  A1c was 12.3 today  Instructed him to call his cardiologist about his lower blood pressure and see if maybe they want to reduce the dose of Entresto, I know he is on it for congestive heart failure.

## 2022-07-21 NOTE — Progress Notes (Addendum)
BP (!) 72/43   Pulse 82   Ht 6\' 2"  (1.88 m)   Wt 188 lb (85.3 kg)   SpO2 96%   BMI 24.14 kg/m    Subjective:   Patient ID: Greg Robertson, male    DOB: 1950-05-09, 72 y.o.   MRN: [id number]  HPI: Greg Robertson is a 72 y.o. male presenting on 07/21/2022 for Medical Management of Chronic Issues and Diabetes   HPI Type 1 diabetes mellitus Patient comes in today for recheck of his diabetes. Patient has been currently taking NovoLog, he says it 14 to 20 units, he is doing 1 unit per every 5 g of sugar for meals and Tresiba 20 units daily. Patient is currently on an ACE inhibitor/ARB. Patient has not seen an ophthalmologist this year. Patient denies any new issues with their feet. The symptom started onset as an adult CHF and hypertension and hyperlipidemia ARE RELATED TO DM   Hypertension and CHF Patient is currently on carvedilol and furosemide Entresto and spironolactone, and their blood pressure today is 72/43 which is lower than when he saw the cardiologist just barely. Patient denies any lightheadedness or dizziness. Patient denies headaches, blurred vision, chest pains, shortness of breath, or weakness. Denies any side effects from medication and is content with current medication.   Hyperlipidemia Patient is coming in for recheck of his hyperlipidemia. The patient is currently taking atorvastatin. They deny any issues with myalgias or history of liver damage from it. They deny any focal numbness or weakness or chest pain.   Relevant past medical, surgical, family and social history reviewed and updated as indicated. Interim medical history since our last visit reviewed. Allergies and medications reviewed and updated.  Review of Systems  Constitutional:  Negative for chills and fever.  Eyes:  Negative for visual disturbance.  Respiratory:  Negative for shortness of breath and wheezing.   Cardiovascular:  Negative for chest pain and leg swelling.  Musculoskeletal:  Negative  for back pain and gait problem.  Skin:  Negative for rash.  Neurological:  Negative for dizziness, weakness and light-headedness.  All other systems reviewed and are negative.   Per HPI unless specifically indicated above   Allergies as of 07/21/2022       Reactions   Oxycodone Nausea And Vomiting        Medication List        Accurate as of July 21, 2022 11:59 PM. If you have any questions, ask your nurse or doctor.          STOP taking these medications    EPINEPHrine 0.3 mg/0.3 mL Soaj injection Commonly known as: EPI-PEN Stopped by: Elige Radon Zaquan Duffner, MD       TAKE these medications    acetaminophen 500 MG tablet Commonly known as: TYLENOL Take 1,000 mg by mouth every 6 (six) hours as needed for mild pain or headache. Pain   ascorbic acid 500 MG tablet Commonly known as: VITAMIN C Take 500 mg by mouth in the morning. (0800)   atorvastatin 40 MG tablet Commonly known as: LIPITOR TAKE ONE TABLET BY MOUTH ONCE DAILY.   carvedilol 12.5 MG tablet Commonly known as: COREG Take 1 tablet (12.5 mg total) by mouth 2 (two) times daily with a meal. Dose change-updated dispill   Cholecalciferol 50 MCG (2000 UT) Tabs Take 2 tablets by mouth daily.   Entresto 97-103 MG Generic drug: sacubitril-valsartan Take 1 tablet by mouth 2 (two) times daily.   FreeStyle [place] 2  Sensor Misc 1 each by Does not apply route every 14 (fourteen) days   furosemide 40 MG tablet Commonly known as: LASIX Take 1 tablet (40 mg total) by mouth daily in the afternoon.   glucose blood test strip Use as instructed   insulin degludec 100 UNIT/ML FlexTouch Pen Commonly known as: TRESIBA Inject 16-20 Units into the skin daily.   Insulin Pen Needle 32G X 4 MM Misc Use as directed   Lancets Misc 1 Lancet by Does not apply route 3 (three) times daily.   levocetirizine 5 MG tablet Commonly known as: XYZAL Take 1 tablet (5 mg total) by mouth daily.   Lokelma 10 g Pack  packet Generic drug: sodium zirconium cyclosilicate Take 10 g by mouth daily.   multivitamin with minerals Tabs tablet Take 1 tablet by mouth daily. (0800)   NovoLOG FlexPen 100 UNIT/ML FlexPen Generic drug: insulin aspart INJECT 20-30 UNITS INTO THE SKIN THREE TIMES DAILY WITH MEALS   rivaroxaban 20 MG Tabs tablet Commonly known as: Xarelto TAKE ONE TABLET BY MOUTH EVERY EVENING WITH SUPPER   spironolactone 25 MG tablet Commonly known as: ALDACTONE Take 0.5 tablets (12.5 mg total) by mouth daily.   triamcinolone 55 MCG/ACT Aero nasal inhaler Commonly known as: NASACORT INSTILL 1 SPRAY IN EACH NOSTRIL ONCE OR TWICE DAILY AS NEEDED.         Objective:   BP (!) 72/43   Pulse 82   Ht 6\' 2"  (1.88 m)   Wt 188 lb (85.3 kg)   SpO2 96%   BMI 24.14 kg/m   Wt Readings from Last 3 Encounters:  07/21/22 188 lb (85.3 kg)  06/20/22 189 lb 9.6 oz (86 kg)  04/20/22 190 lb (86.2 kg)    Physical Exam Vitals and nursing note reviewed.  Constitutional:      General: He is not in acute distress.    Appearance: He is well-developed. He is not diaphoretic.  Eyes:     General: No scleral icterus.    Conjunctiva/sclera: Conjunctivae normal.  Neck:     Thyroid: No thyromegaly.  Cardiovascular:     Rate and Rhythm: Normal rate and regular rhythm.     Heart sounds: Normal heart sounds. No murmur heard. Pulmonary:     Effort: Pulmonary effort is normal. No respiratory distress.     Breath sounds: Normal breath sounds. No wheezing.  Musculoskeletal:        General: No swelling. Normal range of motion.     Cervical back: Neck supple.  Lymphadenopathy:     Cervical: No cervical adenopathy.  Skin:    General: Skin is warm and dry.     Findings: No rash.  Neurological:     Mental Status: He is alert and oriented to person, place, and time.     Coordination: Coordination normal.  Psychiatric:        Behavior: Behavior normal.       Assessment & Plan:   Problem List Items  Addressed This Visit       Cardiovascular and Mediastinum   Hypertension, essential     Endocrine   DM type 1 (diabetes mellitus, type 1) (HCC) - Primary (Chronic)   Relevant Orders   Bayer DCA Hb A1c Waived (Completed)     Other   Retinopathy   Mixed hyperlipidemia    Patient has a bottle of orange juice here in the office, he says he uses it to keep his sugars up so he does not do well.  He says has not had any low blood sugars in a while but still drinks steroids juice.  His diet does not do the best either.  We recommended again that he increase his NovoLog to 1 unit per 4 g of carbs to get better coverage and continue Tresiba 20.  Also recommended that he check his blood sugar before every meal and 1 hour after so we can get a better idea of whether or not he is covered insufficient mealtime insulin  He did not bring his meter today with him so it is hard to assess.  He does want to get an appointment with our pharmacologist to see about getting a new CGM.  A1c was 12.3 today  Instructed him to call his cardiologist about his lower blood pressure and see if maybe they want to reduce the dose of Entresto, I know he is on it for congestive heart failure. Follow up plan: Return in about 3 months (around 10/20/2022), or if symptoms worsen or fail to improve, for Diabetes and hypertension and cholesterol.  Counseling provided for all of the vaccine components Orders Placed This Encounter  Procedures   Bayer DCA Hb A1c Waived    Arville Care, MD [facility] 08/22/2022, 10:32 PM

## 2022-08-01 ENCOUNTER — Telehealth: Payer: Medicare Other

## 2022-08-01 ENCOUNTER — Telehealth: Payer: Self-pay | Admitting: *Deleted

## 2022-08-01 NOTE — Telephone Encounter (Signed)
   CCM RN Visit Note   08/01/22 Name: ALIXANDER SMILING MRN: [id number]      DOB: Mar 17, 1951  Subjective: JONTHOMAS ANAGNOS is a 72 y.o. year old male who is a primary care patient of Arville Care MD The patient was referred to the Chronic Care Management team for assistance with care management needs subsequent to provider initiation of CCM services and plan of care.      An unsuccessful telephone outreach was attempted today to contact the patient about Chronic Care Management needs.    Plan:Telephone follow up appointment with care management team member scheduled for:  upon care guide rescheduling  Irving Shows [facility], BSN RN Case Manager [facility] [phone]

## 2022-08-02 ENCOUNTER — Telehealth: Payer: Self-pay | Admitting: *Deleted

## 2022-08-02 NOTE — Telephone Encounter (Signed)
   CCM RN Visit Note   08/02/22 Name: Greg Robertson MRN: [id number]      DOB: 1950-07-09  Subjective: Greg Robertson is a 72 y.o. year old male who is a primary care patient of Arville Care MD. The patient was referred to the Chronic Care Management team for assistance with care management needs subsequent to provider initiation of CCM services and plan of care.      Second unsuccessful telephone outreach was attempted today to contact the patient about Chronic Care Management needs.    Plan:Telephone follow up appointment with care management team member scheduled for:  upon care guide rescheduling.  Irving Shows RNC, BSN RN Case Manager [facility] [phone]

## 2022-08-05 ENCOUNTER — Other Ambulatory Visit: Payer: Self-pay | Admitting: Family Medicine

## 2022-08-05 ENCOUNTER — Other Ambulatory Visit (HOSPITAL_COMMUNITY): Payer: Self-pay | Admitting: Cardiology

## 2022-08-11 ENCOUNTER — Ambulatory Visit (INDEPENDENT_AMBULATORY_CARE_PROVIDER_SITE_OTHER): Payer: Medicare Other | Admitting: Pharmacist

## 2022-08-11 VITALS — BP 110/70 | HR 65

## 2022-08-11 DIAGNOSIS — E1059 Type 1 diabetes mellitus with other circulatory complications: Secondary | ICD-10-CM | POA: Diagnosis not present

## 2022-08-11 MED ORDER — FREESTYLE LIBRE 3 SENSOR MISC: 11 refills | Status: DC

## 2022-08-11 MED ORDER — FREESTYLE LIBRE 3 READER DEVI: 1.0000 | Freq: Once | 0 refills | Status: AC

## 2022-08-11 NOTE — Progress Notes (Signed)
S:    PCP: Dr. Louanne Skye   72 y.o. male who presents for diabetes evaluation, education, and management.  PMH is significant for T1DM, HLD, HF, atrial fibrillation, stroke in June 2022.  Patient was last seen by Primary Care Provider, Dr. Louanne Skye, on 04/20/2022.  At last visit, A1c was elevated at 12.1%, up from 8.8%.    Today, patient is in fair spirits. Discussed an endocrinology referral with patient; however, he declined stating he was already seeing a too many doctors. He also notes that he did not like his FreeStyle [place] because it was often giving lower readings than his traditional glucometer, typically at least 50 point difference. Also noted connectivity issues where his reader would give a signal loss alarm when his reader was in his shirt pocket.    Patient reports Diabetes was diagnosed at 72 YO.    Current diabetes medications include: Tresiba 20 units QAM, Novolog 14-20 units TID (1 unit for every 5 g of carbs) Current hypertension medications include: Entresto 97/103 mg BID, Lasix 40 mg daily, carvedilol 12.5 mg BID, spirolactone 12.5 mg daily Current hyperlipidemia medications include: atorvastatin 80 mg daily   Patient reports adherence to taking all medications as prescribed.    Insurance coverage: Medicare   Patient denies hypoglycemic events. However, he did have a BG of 75 a few days ago   Reported home fasting blood sugars: 130-150 occasionally, but mostly 200s. Down from 300s  Reported 2 hour post-meal/random blood sugars: 150s-200s.   Patient reported dietary habits: cut down on fries and potato consumption, trying to read labels more carefully, not eating as much rice, drinks diet sdoa    O:      Recent Labs       Lab Results  Component Value Date    HGBA1C 12.1 (H) 04/20/2022      There were no vitals filed for this visit.   Lipid Panel  Labs (Brief)          Component Value Date/Time    CHOL 172 06/20/2022 1105    CHOL 173  04/20/2022 1147    CHOL 220 (H) 10/29/2012 1729    TRIG 66 06/20/2022 1105    TRIG 200 (H) 01/21/2015 1051    TRIG 179 (H) 10/29/2012 1729    HDL 55 06/20/2022 1105    HDL 45 04/20/2022 1147    HDL 63 01/21/2015 1051    HDL 68 10/29/2012 1729    CHOLHDL 3.1 06/20/2022 1105    VLDL 13 06/20/2022 1105    LDLCALC 104 (H) 06/20/2022 1105    LDLCALC 102 (H) 04/20/2022 1147    LDLCALC 116 (H) 10/29/2012 1729        Clinical Atherosclerotic Cardiovascular Disease (ASCVD): Yes - stroke in June 2022 The ASCVD Risk score (Arnett DK, et al., 2019) failed to calculate for the following reasons:   The patient has a prior MI or stroke diagnosis    A/P: Diabetes longstanding currently uncontrolled based on A1c (12.1%). Patient is able to verbalize appropriate hypoglycemia management plan. Appears he has some difficulty calculating how many carbs are in his meals to dose his meal time insulin. He continues to be fearful of hypoglycemia, especially nocturnal, but he reports he did not have success with FreeStyle [place] 2 (signal loss, incorrect readings). He declines an endocrinology referral. -Continued basal insulin Tresiba (insulin degludec) 20 units daily in the morning.  -Continued rapid insulin  Novolog (insulin aspart) 14-20 units TID (  1 unit per 5 grams of carbs). He reports Dr. Louanne Skye had discussed with him about using 1 unit per 4 grams of carbs. Patient willing to try this to see if he can achieve better blood sugar control.  -Called in [place] 3 to the local pharmacy to see if covered -Patient educated on purpose, proper use, and potential adverse effects of insulin.  -Extensively discussed pathophysiology of diabetes, recommended lifestyle interventions, dietary effects on blood sugar control.  -Counseled on s/sx of and management of hypoglycemia.     ASCVD risk - secondary prevention in patient with diabetes. Last LDL is not at goal of <55 mg/dL. high intensity statin indicated.   -Continued atorvastatin 40- 80 mg daily as tolerated (working on up to 80mg  daily)   Written patient instructions provided. Patient verbalized understanding of treatment plan.  Total time in face to face counseling 40 minutes.     Follow-up:  PCP clinic visit in April 2024.      Kieth Brightly, PharmD, BCACP Clinical Pharmacist, [facility]

## 2022-08-16 ENCOUNTER — Encounter: Payer: Self-pay | Admitting: Pharmacist

## 2022-08-22 DIAGNOSIS — E782 Mixed hyperlipidemia: Secondary | ICD-10-CM | POA: Insufficient documentation

## 2022-08-24 ENCOUNTER — Ambulatory Visit (INDEPENDENT_AMBULATORY_CARE_PROVIDER_SITE_OTHER): Payer: Medicare Other | Admitting: *Deleted

## 2022-08-24 ENCOUNTER — Telehealth: Payer: Self-pay

## 2022-08-24 DIAGNOSIS — E1059 Type 1 diabetes mellitus with other circulatory complications: Secondary | ICD-10-CM

## 2022-08-24 DIAGNOSIS — I480 Paroxysmal atrial fibrillation: Secondary | ICD-10-CM

## 2022-08-24 NOTE — Chronic Care Management (AMB) (Signed)
   08/24/2022  Greg Robertson [birth year] [id number]   Per message from care guide, pt does not wish to reschedule.  Care plan updated and resolved.  Case closed.  Irving Shows RNC, BSN RN Case Manager [facility] [phone]

## 2022-08-24 NOTE — Progress Notes (Signed)
  Chronic Care Management Note  08/24/2022 Name: [name] MRN: [id number] DOB: 28-Sep-1950  Greg Robertson is a 72 y.o. year old male who is a primary care patient of Dettinger, Elige Radon, MD and is actively engaged with the Chronic Care Management team. I reached out to Markus Daft by phone today to assist with re-scheduling a follow up visit with the RN Case Manager  Follow up plan: Patient declines further follow up and engagement by the care management team. Appropriate care team members and provider have been notified via electronic communication.   Penne Lash, RMA Care Guide [facility]  [place], [place] [address] Direct Dial: [phone] [email or web address]

## 2022-09-01 ENCOUNTER — Telehealth: Payer: Self-pay | Admitting: Family Medicine

## 2022-09-01 NOTE — Telephone Encounter (Signed)
Contacted Kolt D Ben to schedule their annual wellness visit. Appointment made for 02/24/2023.  Thank you,  Judeth Cornfield,  AMB Clinical Support [facility] Program Direct Dial ??[phone]

## 2022-09-06 ENCOUNTER — Other Ambulatory Visit: Payer: Self-pay | Admitting: Family Medicine

## 2022-09-06 ENCOUNTER — Other Ambulatory Visit (HOSPITAL_COMMUNITY): Payer: Self-pay | Admitting: Cardiology

## 2022-09-16 DIAGNOSIS — I480 Paroxysmal atrial fibrillation: Secondary | ICD-10-CM

## 2022-09-16 DIAGNOSIS — E1059 Type 1 diabetes mellitus with other circulatory complications: Secondary | ICD-10-CM

## 2022-10-04 ENCOUNTER — Ambulatory Visit (HOSPITAL_COMMUNITY)
Admission: RE | Admit: 2022-10-04 | Discharge: 2022-10-04 | Disposition: A | Discharge status: Home / Self Care | Payer: Medicare Other | Source: Ambulatory Visit | Attending: Family Medicine | Admitting: Family Medicine

## 2022-10-04 ENCOUNTER — Telehealth (HOSPITAL_COMMUNITY): Payer: Self-pay

## 2022-10-04 ENCOUNTER — Encounter (HOSPITAL_COMMUNITY): Payer: Self-pay

## 2022-10-04 VITALS — BP 98/52 | HR 71 | Ht 74.0 in | Wt 189.0 lb

## 2022-10-04 DIAGNOSIS — Z79899 Other long term (current) drug therapy: Secondary | ICD-10-CM | POA: Diagnosis not present

## 2022-10-04 DIAGNOSIS — Z8616 Personal history of COVID-19: Secondary | ICD-10-CM | POA: Diagnosis not present

## 2022-10-04 DIAGNOSIS — E875 Hyperkalemia: Secondary | ICD-10-CM | POA: Insufficient documentation

## 2022-10-04 DIAGNOSIS — I5022 Chronic systolic (congestive) heart failure: Secondary | ICD-10-CM | POA: Insufficient documentation

## 2022-10-04 DIAGNOSIS — I428 Other cardiomyopathies: Secondary | ICD-10-CM | POA: Diagnosis not present

## 2022-10-04 DIAGNOSIS — Z7901 Long term (current) use of anticoagulants: Secondary | ICD-10-CM | POA: Diagnosis not present

## 2022-10-04 DIAGNOSIS — I480 Paroxysmal atrial fibrillation: Secondary | ICD-10-CM | POA: Diagnosis not present

## 2022-10-04 DIAGNOSIS — Z85038 Personal history of other malignant neoplasm of large intestine: Secondary | ICD-10-CM | POA: Insufficient documentation

## 2022-10-04 DIAGNOSIS — Z87891 Personal history of nicotine dependence: Secondary | ICD-10-CM | POA: Insufficient documentation

## 2022-10-04 DIAGNOSIS — E1022 Type 1 diabetes mellitus with diabetic chronic kidney disease: Secondary | ICD-10-CM | POA: Insufficient documentation

## 2022-10-04 DIAGNOSIS — N183 Chronic kidney disease, stage 3 unspecified: Secondary | ICD-10-CM | POA: Insufficient documentation

## 2022-10-04 DIAGNOSIS — I2510 Atherosclerotic heart disease of native coronary artery without angina pectoris: Secondary | ICD-10-CM | POA: Diagnosis not present

## 2022-10-04 DIAGNOSIS — Z794 Long term (current) use of insulin: Secondary | ICD-10-CM | POA: Diagnosis not present

## 2022-10-04 DIAGNOSIS — E785 Hyperlipidemia, unspecified: Secondary | ICD-10-CM | POA: Diagnosis not present

## 2022-10-04 DIAGNOSIS — E1069 Type 1 diabetes mellitus with other specified complication: Secondary | ICD-10-CM | POA: Diagnosis not present

## 2022-10-04 DIAGNOSIS — I5042 Chronic combined systolic (congestive) and diastolic (congestive) heart failure: Secondary | ICD-10-CM

## 2022-10-04 DIAGNOSIS — E1059 Type 1 diabetes mellitus with other circulatory complications: Secondary | ICD-10-CM

## 2022-10-04 DIAGNOSIS — I130 Hypertensive heart and chronic kidney disease with heart failure and stage 1 through stage 4 chronic kidney disease, or unspecified chronic kidney disease: Secondary | ICD-10-CM | POA: Insufficient documentation

## 2022-10-04 LAB — BASIC METABOLIC PANEL
Anion gap: 9 (ref 5–15)
BUN: 36 mg/dL — ABNORMAL HIGH (ref 8–23)
CO2: 25 mmol/L (ref 22–32)
Calcium: 8.8 mg/dL — ABNORMAL LOW (ref 8.9–10.3)
Chloride: 97 mmol/L — ABNORMAL LOW (ref 98–111)
Creatinine, Ser: 1.81 mg/dL — ABNORMAL HIGH (ref 0.61–1.24)
GFR, Estimated: 39 mL/min — ABNORMAL LOW (ref >60)
Glucose, Bld: 455 mg/dL — ABNORMAL HIGH (ref 70–99)
Potassium: 6.4 mmol/L (ref 3.5–5.1)
Sodium: 131 mmol/L — ABNORMAL LOW (ref 135–145)

## 2022-10-04 LAB — BRAIN NATRIURETIC PEPTIDE: B Natriuretic Peptide: 59.8 pg/mL (ref 0.0–100.0)

## 2022-10-04 MED ORDER — ENTRESTO 49-51 MG PO TABS: 1.0000 | ORAL_TABLET | Freq: Two times a day (BID) | ORAL | 6 refills | Status: DC

## 2022-10-04 NOTE — Telephone Encounter (Signed)
-----   Message from Jacklynn Ganong, [place] sent at 10/04/2022 12:28 PM EDT ----- K is too high. Stop drinking OJ and high K foods.  Stop spiro and Entresto. Please continue Lokelma 10 g daily. Repeat BMET on Thursday

## 2022-10-04 NOTE — Patient Instructions (Addendum)
Thank you for coming in today  If you had labs drawn today, any labs that are abnormal the clinic will call you No news is good news  You have been referred to Lipid Clinic, their office will call you for further appointment details    Medications: DECREASE Entresto to 49/51 mg 1 tablet twice daily  Follow up appointments:  Your physician recommends that you schedule a follow-up appointment in:  4 months With Dr. Earlean Shawl will receive a reminder letter in the mail a few months in advance. If you don't receive a letter, please call our office to schedule the follow-up appointment.    Do the following things EVERYDAY: Weigh yourself in the morning before breakfast. Write it down and keep it in a log. Take your medicines as prescribed Eat low salt foods--Limit salt (sodium) to 2000 mg per day.  Stay as active as you can everyday Limit all fluids for the day to less than 2 liters   At the [facility], you and your health needs are our priority. As part of our continuing mission to provide you with exceptional heart care, we have created designated Provider Care Teams. These Care Teams include your primary Cardiologist (physician) and Advanced Practice Providers (APPs- Physician Assistants and Nurse Practitioners) who all work together to provide you with the care you need, when you need it.   You may see any of the following providers on your designated Care Team at your next follow up: Dr Arvilla Meres Dr [name] Dr. Marcos Eke, NP Robbie Lis, [place] [facility] [place], [place] Brynda Peon, NP Karle Plumber, PharmD   Please be sure to bring in all your medications bottles to every appointment.    Thank you for choosing [facility]  If you have any questions or concerns before your next appointment please send [place] a message through [place] or call our office at [phone].    TO  LEAVE A MESSAGE FOR THE NURSE SELECT OPTION 2, PLEASE LEAVE A MESSAGE INCLUDING: YOUR NAME DATE OF BIRTH CALL BACK NUMBER REASON FOR CALL**this is important as we prioritize the call backs  YOU WILL RECEIVE A CALL BACK THE SAME DAY AS LONG AS YOU CALL BEFORE 4:00 PM

## 2022-10-04 NOTE — Progress Notes (Signed)
Called and spoke w/ pt about lab results. Pt stated he needs to drink his Orange juice if his blood sugar gets too low. Notified pt he can drink an alternative juice, but not orange juice until we repeat labs and check his potassium again Thursday. Pt had good understanding and no further questions. Pt states he is going to get his labs done at his PCP office instead on 10/05/22.

## 2022-10-04 NOTE — Progress Notes (Signed)
Advanced [facility] Failure Clinic Progress Note    Patient ID: Greg Robertson, male   DOB: 15-Jun-1950, 72 y.o.   MRN: [id number] PCP: Dettinger, Elige Radon, MD Cardiology: Dr. Shirlee Latch  72 y.o. with history of HTN, type I diabetes, and paroxysmal atrial fibrillation/CVA was referred by Dr. Frances Furbish for evaluation of cardiomyopathy.  Patient also has had colon cancer with ileocolectomy in 2/13.  In 11/13, he fell and broke his clavicle (tripped).  Soon after this, he was admitted with DKA and profound dehydration/metabolic derangement.  He was noted to be in atrial fibrillation with RVR.  His cardiac enzymes were elevated, troponin peaked at 1.57. He initially went back into NSR but then had recurrent episodes of atrial fibrillation.  He was also started on Eliquis due to risk of embolic CVA, however he subsequently stopped Eliquis.   He was admitted in 6/22 with right PCA CVA and hemorrhagic conversion.  Initially just started on ASA, later this was stopped and Xarelto started.  Main residual from CVA is mild left upper visual field impairment.   CTA neck showed < 50% bilateral carotid stenosis.  Echo showed EF 30-35%, diffuse hypokinesis.  RHC/LHC in 8/22 showed nonobstructive CAD and normal filling pressures.   Cardiac MRI in 11/22 showed LV EF 38%, RVEF 46%, basal inferolateral mid-wall LGE. Diffuse LGE images.  ECV 37-46%.  Based on cMRI, differential included prior myocarditis and cardiac amyloidosis.  PYP scan was done in 2/23, showing grade 1-2, H/CL 1.25.  Invitae gene testing for TTR mutations was negative. Based on full picture, TTR amyloidosis felt unlikely.   Echo (3/24) showed EF 55-60%, grade I DD, normal RV.  Today he returns for HF follow up with his wife. Overall feeling fine. He has positional dizziness, no falls. BP continues to run soft. He keeps bottle of OJ with him in case he has a low blood sugar. Blood sugars running 200s, had a few in 120-150s. He is working out 3 days a week at  [employer] and doing well with this, no dyspnea. Denies palpitations, CP, edema, or PND/Orthopnea. Appetite ok. No fever or chills. Not weighing daily. Taking all medications.   ECG (personally reviewed): none ordered today.  Labs (7/22): K 5.6, creatinine 1.17 Labs (8/22): BNP 283, LDL 67, HDL 58, K 5.3, creatinine 7.82 Labs (10/22): K 5.1, creatinine 1.24 Labs (11/22): urine immunofixation negative, K 4.9, creatinine 1.37 Labs (2/23): K 4.1, creatinine 1.33, myeloma panel negative, LDL 68, TGs 58, hgb 12.8 Labs (5/23): K 4.8, creatinine 1.73, BNP 128 Labs (9/23): Hgb 12.3, creatinine 1.68, K 4.7 Labs (1/24): K 5.5, creatinine 1.63, glucose 467 Labs (3/24): K 4.9, creatinine 1.51, LDL 104  PMH: 1. HTN 2. Type I diabetes 3. Anemia, thrombocytopenia, leukopenia: related to chemotherapy.  4. Clavicular fracture 11/13 due to mechanical fall.  5. 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  FOLFOX chemotherapy 3/13 - 9/13.  7. Allergic rhinitis 8. Paroxysmal atrial fibrillation: First diagnosed in 11/13 during admission for DKA. Echo (11/13): EF 55% with grade I diastolic dysfunction.  9. Right central retinal artery occlusion 10. 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  - CTA neck with <50% carotid stenosis.  11. Cardiomyopathy: Nonischemic cardiomyopathy.  Echo in 6/22 with EF 30-35%, global hypokinesis, normal RV.   - RHC/LHC (8/22): 40% ostial/proximal LAD, D1 50%.  Mean RA 2, PA 29/9, mean PCWP 8, CI 2.94.  - Cardiac MRI (11/22): LV EF 38%, RVEF 46%, basal inferolateral mid-wall LGE. -  PYP (2/23) showing grade 1-2, H/CL 1.25.  The PYP scan was equivocal.  - Echo (3/24) showed EF 55-60%, grade I DD, normal RV 12. COVID-19 2/23 13. OSA 14. CKD stage 3: Diabetic nephropathy  SH: Lives in [place].  Quit smoking in 1995.  H/o marijuana.  Quit drinking ETOH in 2022.    FH: No atrial fibrillation or CAD.   ROS: All systems reviewed and negative except as  per HPI.   Current Outpatient Medications  Medication Sig Dispense Refill   atorvastatin (LIPITOR) 40 MG tablet TAKE ONE TABLET BY MOUTH ONCE DAILY. 30 tablet 0   carvedilol (COREG) 12.5 MG tablet Take 1 tablet (12.5 mg total) by mouth 2 (two) times daily with a meal. Dose change-updated dispill 180 tablet 3   Cholecalciferol 50 MCG (2000 UT) TABS Take 2 tablets by mouth daily.     furosemide (LASIX) 40 MG tablet Take 1 tablet (40 mg total) by mouth daily in the afternoon. 90 tablet 3   insulin degludec (TRESIBA) 100 UNIT/ML FlexTouch Pen Inject 16-20 Units into the skin daily.     levocetirizine (XYZAL) 5 MG tablet Take 1 tablet (5 mg total) by mouth daily. 90 tablet 3   Multiple Vitamin (MULTIVITAMIN WITH MINERALS) TABS tablet Take 1 tablet by mouth daily. (0800)     NOVOLOG FLEXPEN 100 UNIT/ML FlexPen INJECT 20-30 UNITS INTO THE SKIN THREE TIMES DAILY WITH MEALS 15 mL 0   rivaroxaban (XARELTO) 20 MG TABS tablet TAKE ONE TABLET BY MOUTH EVERY EVENING WITH SUPPER 30 tablet 11   sacubitril-valsartan (ENTRESTO) 97-103 MG Take 1 tablet by mouth 2 (two) times daily. 180 tablet 3   sodium zirconium cyclosilicate (LOKELMA) 10 g PACK packet Take 10 g by mouth daily. 90 packet 3   spironolactone (ALDACTONE) 25 MG tablet Take 0.5 tablets (12.5 mg total) by mouth daily. 45 tablet 3   triamcinolone (NASACORT) 55 MCG/ACT AERO nasal inhaler INSTILL 1 SPRAY IN EACH NOSTRIL ONCE OR TWICE DAILY AS NEEDED. 16.5 Bottle 11   vitamin C (ASCORBIC ACID) 500 MG tablet Take 500 mg by mouth in the morning. (0800)     acetaminophen (TYLENOL) 500 MG tablet Take 1,000 mg by mouth every 6 (six) hours as needed for mild pain or headache. Pain (Patient not taking: Reported on 10/04/2022)     Continuous Glucose Sensor (FREESTYLE LIBRE 3 SENSOR) MISC Place 1 sensor on the skin every 14 days. Use to check glucose continuously. DX: E10.9 2 each 11   glucose blood test strip Use as instructed 100 each 12   Insulin Pen Needle 32G X  4 MM MISC Use as directed 150 each 2   Lancets MISC 1 Lancet by Does not apply route 3 (three) times daily. 100 each 3   No current facility-administered medications for this encounter.   Wt Readings from Last 3 Encounters:  10/04/22 85.7 kg (189 lb)  07/21/22 85.3 kg (188 lb)  06/20/22 86 kg (189 lb 9.6 oz)   BP (!) 98/52   Pulse 71   Ht 6\' 2"  (1.88 m)   Wt 85.7 kg (189 lb)   SpO2 99%   BMI 24.27 kg/m  Physical Exam: General:  NAD. No resp difficulty, walked into clinic HEENT: Normal Neck: Supple. No JVD. Carotids 2+ bilat; no bruits. No lymphadenopathy or thryomegaly appreciated. Cor: PMI nondisplaced. Regular rate & rhythm. No rubs, gallops or murmurs. Lungs: Clear Abdomen: Soft, nontender, nondistended. No hepatosplenomegaly. No bruits or masses. Good bowel sounds. Extremities: No  cyanosis, clubbing, rash, edema Neuro: Alert & oriented x 3, cranial nerves grossly intact. Moves all 4 extremities w/o difficulty. Affect pleasant.  Assessment/Plan: 1. Atrial fibrillation:  Paroxysmal. Denies breakthrough symptoms.  Regular on exam today. - Continue Coreg 12.5 mg bid.  - Continue Xarelto 20 mg daily. No bleeding issues. - If atrial fibrillation recurs frequently, could use anti-arrhythmic versus ablation.  2. Chronic systolic CHF: Nonischemic cardiomyopathy.  Echo in 6/22 at time of CVA showed EF 30-35%, diffuse hypokinesis.  LHC/RHC in 8/22 showed nonobstructive CAD and normal filling pressures.  Cardiac MRI in 11/22 showed LV EF 38%, RVEF 46%, basal inferolateral mid-wall LGE.  Possible prior viral myocarditis based on MRI, cannot rule out cardiac amyloidosis.  PYP scan was equivocal but likely negative, Invitae gene testing negative for transthyretin mutation, myeloma panel was negative => suspect TTR cardiac amyloidosis is unlikely. Echo (3/24) showed EF 55-60%, grade I DD, normal RV.  NYHA class I-II symptoms, not volume overloaded by exam.  GDMT titration limited by CKD and soft  BP.  - With soft BP and dizziness, decrease Entresto to 49/51 mg bid. BMET today. - Continue Coreg 12.5 mg bid. BP too soft for dose titration.  - Continue spironolactone 12.5 mg daily. On low dose given h/o hyperkalemia. He is on daily Lokelma 10 g to control K.  - Continue Lasix 40 mg daily.  - No SGLT2 inhibitor with type 1 diabetes.  - EF out of range for ICD on 11/22 cMRI and echo 3/24.  Narrow QRS so not CRT candidate.   3. Hyperlipidemia: Continue  atorvastatin 80 mg daily. LDL 104 (3/24), goal LDL < 55. - Long discussion about addressing cardiac risk factors. He is agreeable to consider PCSK9i. Refer to Lipid Clinic for PCSK9i 4. CAD: Nonobstructive on cath 8/22. No chest pain.  - Continue statin. - No ASA given Xarelto use.  5. Type 1 diabetes: Glucose control seems poor.  Most recent A1c 12.3. - Recommended that he seen an endocrinologist.   6. CKD stage 3: Diabetic nephropathy with a tendency towards hyperkalemia.  - Recommended followup with a nephrologist. BMET today.   Follow up in 4 months with Dr. Kathreen Cornfield [facility], FNP-BC 10/04/2022

## 2022-10-06 ENCOUNTER — Other Ambulatory Visit: Payer: Medicare Other

## 2022-10-06 DIAGNOSIS — I5022 Chronic systolic (congestive) heart failure: Secondary | ICD-10-CM

## 2022-10-06 DIAGNOSIS — I5042 Chronic combined systolic (congestive) and diastolic (congestive) heart failure: Secondary | ICD-10-CM

## 2022-10-06 NOTE — Addendum Note (Signed)
Addended byViviann Spare on: 10/06/2022 10:42 AM   Modules accepted: Orders

## 2022-10-07 ENCOUNTER — Telehealth (HOSPITAL_COMMUNITY): Payer: Self-pay

## 2022-10-07 ENCOUNTER — Other Ambulatory Visit (HOSPITAL_COMMUNITY): Payer: Self-pay | Admitting: Cardiology

## 2022-10-07 ENCOUNTER — Other Ambulatory Visit: Payer: Self-pay | Admitting: Family Medicine

## 2022-10-07 LAB — BASIC METABOLIC PANEL
BUN/Creatinine Ratio: 20 (ref 10–24)
BUN: 34 mg/dL — ABNORMAL HIGH (ref 8–27)
CO2: 18 mmol/L — ABNORMAL LOW (ref 20–29)
Calcium: 8.8 mg/dL (ref 8.6–10.2)
Chloride: 106 mmol/L (ref 96–106)
Creatinine, Ser: 1.67 mg/dL — ABNORMAL HIGH (ref 0.76–1.27)
Glucose: 214 mg/dL — ABNORMAL HIGH (ref 70–99)
Potassium: 5.1 mmol/L (ref 3.5–5.2)
Sodium: 139 mmol/L (ref 134–144)
eGFR: 43 mL/min/{1.73_m2} — ABNORMAL LOW (ref >59)

## 2022-10-07 NOTE — Telephone Encounter (Signed)
-----   Message from Jacklynn Ganong, [place] sent at 10/07/2022 10:14 AM EDT ----- K improved. Please continue to refrain from high-potassium foods.  Remain off Entresto and spiro for now. Please arrange a pharmacy follow up in 3-4 weeks to see if we can safely add these medications back

## 2022-10-07 NOTE — Telephone Encounter (Signed)
Patient scheduled to see PharmD on 7/15 at 1pm. Patient aware of appointment time and date.

## 2022-10-07 NOTE — Telephone Encounter (Signed)
Called and spoke with patient- advised him of the below recommendations.   Advised patient to avoid high potassium foods and patient states that he prefers to drink orange juice- advised patient to switch juice to a lower potassium containing juice. Patient verbalized understanding.   Also advised patient to remain off of the spiro and Entresto until seen by pharmD- patient states he had started back today, advised patient to remain off of these. Removed from medication list. Patient aware and verbalized understanding. Advised patient to call back to office with any issues, questions, or concerns. Patient verbalized understanding.

## 2022-10-13 ENCOUNTER — Inpatient Hospital Stay: Payer: Medicare Other | Attending: Oncology

## 2022-10-13 ENCOUNTER — Inpatient Hospital Stay (HOSPITAL_BASED_OUTPATIENT_CLINIC_OR_DEPARTMENT_OTHER): Payer: Medicare Other | Admitting: Oncology

## 2022-10-13 VITALS — BP 133/74 | HR 71 | Temp 98.2°F | Resp 18 | Ht 74.0 in | Wt 187.0 lb

## 2022-10-13 DIAGNOSIS — H409 Unspecified glaucoma: Secondary | ICD-10-CM | POA: Diagnosis not present

## 2022-10-13 DIAGNOSIS — C182 Malignant neoplasm of ascending colon: Secondary | ICD-10-CM

## 2022-10-13 DIAGNOSIS — I4891 Unspecified atrial fibrillation: Secondary | ICD-10-CM | POA: Diagnosis not present

## 2022-10-13 DIAGNOSIS — Z794 Long term (current) use of insulin: Secondary | ICD-10-CM | POA: Diagnosis not present

## 2022-10-13 DIAGNOSIS — Z85038 Personal history of other malignant neoplasm of large intestine: Secondary | ICD-10-CM | POA: Insufficient documentation

## 2022-10-13 DIAGNOSIS — I509 Heart failure, unspecified: Secondary | ICD-10-CM | POA: Insufficient documentation

## 2022-10-13 DIAGNOSIS — Z8673 Personal history of transient ischemic attack (TIA), and cerebral infarction without residual deficits: Secondary | ICD-10-CM | POA: Insufficient documentation

## 2022-10-13 DIAGNOSIS — E119 Type 2 diabetes mellitus without complications: Secondary | ICD-10-CM | POA: Diagnosis not present

## 2022-10-13 DIAGNOSIS — D509 Iron deficiency anemia, unspecified: Secondary | ICD-10-CM | POA: Diagnosis present

## 2022-10-13 LAB — CEA (ACCESS): CEA (CHCC): 4.91 ng/mL (ref 0.00–5.00)

## 2022-10-13 NOTE — Progress Notes (Signed)
[facility] OFFICE PROGRESS NOTE   Diagnosis: Colon cancer, hereditary nonpolyposis colon cancer syndrome  INTERVAL HISTORY:   Greg Robertson returns as scheduled.  He feels well.  Good appetite.  No difficulty with bowel function.  No bleeding.  He reports a skin lesion at the parietal scalp for the past few years.  The lesion heals and then reappears.  No associated bleeding.  Objective:  Vital signs in last 24 hours:  Blood pressure 133/74, pulse 71, temperature 98.2 F (36.8 C), temperature source Oral, resp. rate 18, height 6\' 2"  (1.88 m), weight 187 lb (84.8 kg), SpO2 100 %.    Lymphatics: No cervical, supraclavicular, axillary, or inguinal nodes Resp: Lungs clear bilaterally Cardio: Regular rate and rhythm GI: No hepatosplenomegaly, no mass, nontender Vascular: Trace ankle edema bilaterally  Skin: Less than 1 cm tan/pink scabbed lesion at the parietal scalp   Lab Results:  Lab Results  Component Value Date   WBC 6.5 06/20/2022   HGB 12.6 (L) 06/20/2022   HCT 37.8 (L) 06/20/2022   MCV 90.0 06/20/2022   PLT 176 06/20/2022   NEUTROABS 3.5 04/20/2022    CMP  Lab Results  Component Value Date   NA 139 10/06/2022   K 5.1 10/06/2022   CL 106 10/06/2022   CO2 18 (L) 10/06/2022   GLUCOSE 214 (H) 10/06/2022   BUN 34 (H) 10/06/2022   CREATININE 1.67 (H) 10/06/2022   CALCIUM 8.8 10/06/2022   PROT 5.7 (L) 04/20/2022   ALBUMIN 4.1 04/20/2022   AST 18 04/20/2022   ALT 18 04/20/2022   ALKPHOS 146 (H) 04/20/2022   BILITOT 0.2 04/20/2022   GFRNONAA 39 (L) 10/04/2022   GFRAA 72 04/02/2020    Lab Results  Component Value Date   CEA1 5.27 (H) 10/14/2019   CEA 4.91 10/13/2022    Medications: I have reviewed the patient's current medications.   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 Tumor microsatellite unstable, K-ras wild type. Adjuvant FOLFOX  chemotherapy initiated on 07/14/2011. Cycle 12 given on 01/05/2012.   -Negative surveillance colonoscopy 06/06/2012   -Negative surveillance CT scans 04/25/2013 -Negative surveillance CT scans 07/21/2014 -Negative surveillance colonoscopy 09/10/2014 -Negative surveillance colonoscopy 01/16/2018 -Negative surveillance colonoscopy 04/29/2021 Microcytic anemia. Likely secondary to iron deficiency. Resolved. Insulin-dependent diabetes. Glaucoma. 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 He has seen a Runner, broadcasting/film/video. He has been contacted with updated information regarding screening recommendations. Status post Port-A-Cath placement 07/01/2011. The Port-A-Cath has been removed. History of thrombocytopenia secondary to chemotherapy. Oxaliplatin was held with cycle 3. Oxaliplatin was resumed with cycle 4 at a 25% dose reduction. Oxaliplatin was further dose reduced beginning with cycle 6 due to cytopenias. The oxaliplatin was held with cycle 8, cycle 10 and cycle 11. History of neutropenia secondary to chemotherapy. Chronic mild elevation of the CEA Upper endoscopy 01/16/2018-normal esophagus;  mild gastritis; 1 duodenal polyp (tubular adenoma); distal stomach biopsy with antral and corpus mucosa with slight chronic inflammation, negative for H. Pylori,  no intestinal metaplasia dysplasia or malignancy.  Upper endoscopy 04/29/2021-single 13 mm sessile polyp third portion of the duodenum (tubular adenoma, negative for high-grade dysplasia) Right PCA distribution CVA June 2022 CHF History of atrial fibrillation      Disposition: Greg Robertson is in clinical remission from colon cancer.  He  has hereditary nonpolyposis colon cancer syndrome.  He will continue EGD and colonoscopy surveillance with Dr.Mansouraty.  The scalp lesion is most likely benign but has been  present for several years.  He plans to have this evaluated by his primary provider. He would like continue follow-up at the Cancer center.  He will return for an office visit in 1 year. Thornton Papas, MD  10/13/2022  3:19 PM

## 2022-10-14 ENCOUNTER — Telehealth: Payer: Self-pay

## 2022-10-14 NOTE — Telephone Encounter (Signed)
Patient gave verbal understanding and had no further questions or concerns  

## 2022-10-14 NOTE — Telephone Encounter (Signed)
-----   Message from Ladene Artist, MD sent at 10/13/2022  4:49 PM EDT ----- Please call patient, the CEA is normal, follow-up as scheduled

## 2022-10-17 NOTE — Progress Notes (Signed)
Advanced Heart Failure Clinic Note   PCP: Dettinger, Elige Radon, MD Cardiology: Dr. Shirlee Latch  HPI:  72 y.o. with history of HTN, type I diabetes, and paroxysmal atrial fibrillation/CVA.  Patient also has had colon cancer with ileocolectomy in 05/2011.  In 02/2012, he fell and broke his clavicle (tripped).  Soon after this, he was admitted with DKA and profound dehydration/metabolic derangement.  He was noted to be in atrial fibrillation with RVR.  His cardiac enzymes were elevated, troponin peaked at 1.57. He initially went back into NSR but then had recurrent episodes of atrial fibrillation.  He was also started on Eliquis due to risk of embolic CVA, however he subsequently stopped Eliquis.    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  Initially just started on ASA, later this was stopped and Xarelto started.  Main residual from CVA is mild left upper visual field impairment.   CTA neck showed < 50% bilateral carotid stenosis.  Echo showed EF 30-35%, diffuse hypokinesis.  RHC/LHC in 11/2020 showed nonobstructive CAD and normal filling pressures.    Cardiac MRI in 02/2021 showed LV EF 38%, RVEF 46%, basal inferolateral mid-wall LGE. Diffuse LGE images.  ECV 37-46%.  Based on cMRI, differential included prior myocarditis and cardiac amyloidosis.  PYP scan was done in 05/2021, showing grade 1-2, H/CL 1.25.  Inv[employer]ae gene testing for TTR mutations was negative. Based on full picture, TTR amyloidosis felt unlikely.    Echo (06/2022) showed EF 55-60%, grade I DD, normal RV.   Returned to [facility] for HF follow up with his wife 10/04/22. Overall was feeling fine. He had positional dizziness, no falls. BP continued to run soft. He reported that he keeps a bottle of OJ with him in case he has a low blood sugar event. Blood sugars running 200s, had a few in 120-150s. He was working out 3 days a week at [employer] and doing well with this, no dyspnea. Denied palpitations, CP, edema, or  PND/Orthopnea. Appetite was ok. No fever or chills. Was not weighing daily. Reported taking all medications.   Today he returns to HF clinic for pharmacist medication titration. At last visit with APP, Entresto was decreased to 49/51 mg BID due to dizziness. Entresto and spironolactone were then both discontinued after labs from visit returned with K 6.1. Was also referred to Lipid Clinic for PCSK9i. Overall he is feeling well today. Says dizziness has significantly improved since stopping the Entresto and spironolactone. Still has episodes of dizziness every 2-3 days, but these occur after standing and only last a few seconds before resolving. BP has been running low, SBP 90-100s at home. BP 100/58 in clinic. No CP or palpitations. No SOB/DOE. Is able to walk his normal route, which is 1.5 miles, for exercise without difficulty. Weight has been stable at home. He takes Lasix 40 mg daily and has not needed any extra. No LEE, PND or orthopnea. Appetite has been good. Taking all medications as prescribed and tolerating all medications. Colonoscopy and endoscopy procedure scheduled for 12/29/22.   HF Medications: Carvedilol 12.5 mg BID Lasix 40 mg daily  Has the patient been experiencing any side effects to the medications prescribed?  Recent hyperkalemia despite use of Lokelma. Entresto and spironolactone on hold.   Does the patient have any problems obtaining medications due to transportation or finances?   No; has Medicare part D; Gets Lokelma through [employer].    Understanding of regimen: good Understanding of indications: good  Potential of compliance: good Patient understands to avoid NSAIDs. Patient understands to avoid decongestants.    Pertinent Lab Values: 10/13/22: Serum creatinine 5.1, BUN 34, Potassium 5.1, Sodium 139 BMET today pending.   Vital Signs: Weight: 188.4 lbs (last clinic weight: 189 lbs) Blood pressure: 100/58  Heart rate: 63   Assessment/Plan: 1. Atrial fibrillation:   Paroxysmal. Denies breakthrough symptoms.   - Continue carvedilol 12.5 mg BID.  - Continue Xarelto 20 mg daily. No bleeding issues. CrCL has been borderline recently for dose change. Monitor carefully.  - If atrial fibrillation recurs frequently, could use anti-arrhythmic versus ablation.  2. Chronic systolic CHF: Nonischemic cardiomyopathy.  Echo in 09/2020 at time of CVA showed EF 30-35%, diffuse hypokinesis.  LHC/RHC in 11/2020 showed nonobstructive CAD and normal filling pressures.  Cardiac MRI in 02/2021 showed LV EF 38%, RVEF 46%, basal inferolateral mid-wall LGE.  Possible prior viral myocarditis based on MRI, cannot rule out cardiac amyloidosis.  PYP scan was equivocal but likely negative, Inv[employer]ae gene testing negative for transthyretin mutation, myeloma panel was negative => suspect TTR cardiac amyloidosis is unlikely. Echo (06/2022) showed EF 55-60%, grade I DD, normal RV.   - NYHA class I-II symptoms, not volume overloaded by exam.  GDMT titration limited by CKD, K and soft BP. Will make no medication changes today until labs return, BMET today is pending. Concerned that with recent elevated K and low BP, will not be able to restart spironolactone or Entresto in the near future. - Continue Lasix 40 mg daily.  - Continue carvedilol 12.5 mg BID.  - Now holding spironolactone and Entresto for hyperkalemia. Continue daily Lokelma 10 g to control K. Gets from [employer], last delivered 90 day supply on 09/20/22. Can consider increasing Lokelma to 15 g daily if needed.  - No SGLT2 inhibitor with type 1 diabetes.  - If unable to restart Entresto or spironolactone, could consider starting Verquvo in the future. We discussed today.  - EF out of range for ICD on 02/2021 cMRI and echo 06/2022.  Narrow QRS so not CRT candidate.   3. Hyperlipidemia: Continue  atorvastatin 80 mg daily. LDL 104 (06/2022), goal LDL < 55. - Long discussion about addressing cardiac risk factors. He is agreeable to consider PCSK9i.  Referred last visit to Lipid Clinic for PCSK9i  4. CAD: Nonobstructive on cath 11/2020. No chest pain.  - Continue statin. - No ASA given Xarelto use.  5. Type 1 diabetes: Glucose control seems poor.  Most recent A1c 12.3. - Recommended that he seen an endocrinologist.   6. CKD stage 3: Diabetic nephropathy with a tendency towards hyperkalemia.  - Recommended followup with a nephrologist.   Follow up 5 weeks with [facility].    Karle Plumber, PharmD, BCPS, BCCP, CPP Heart Failure Clinic Pharmacist [phone]

## 2022-10-21 ENCOUNTER — Other Ambulatory Visit: Payer: Self-pay | Admitting: Family Medicine

## 2022-10-21 NOTE — Telephone Encounter (Signed)
Last office visit 07/21/22 Upcoming appointment 10/27/22 Listed on med list as historical, provider approval needed

## 2022-10-25 ENCOUNTER — Ambulatory Visit (INDEPENDENT_AMBULATORY_CARE_PROVIDER_SITE_OTHER): Payer: Medicare Other | Admitting: Gastroenterology

## 2022-10-25 ENCOUNTER — Encounter: Payer: Self-pay | Admitting: Gastroenterology

## 2022-10-25 VITALS — BP 108/58 | HR 76 | Ht 73.0 in | Wt 188.1 lb

## 2022-10-25 DIAGNOSIS — Z85038 Personal history of other malignant neoplasm of large intestine: Secondary | ICD-10-CM | POA: Diagnosis not present

## 2022-10-25 DIAGNOSIS — Z9049 Acquired absence of other specified parts of digestive tract: Secondary | ICD-10-CM

## 2022-10-25 DIAGNOSIS — Z1509 Genetic susceptibility to other malignant neoplasm: Secondary | ICD-10-CM

## 2022-10-25 DIAGNOSIS — R194 Change in bowel habit: Secondary | ICD-10-CM

## 2022-10-25 DIAGNOSIS — Z7901 Long term (current) use of anticoagulants: Secondary | ICD-10-CM

## 2022-10-25 DIAGNOSIS — D132 Benign neoplasm of duodenum: Secondary | ICD-10-CM | POA: Diagnosis not present

## 2022-10-25 NOTE — Patient Instructions (Signed)
You have been scheduled for a colonoscopy /Endoscopy . Please follow written instructions given to you at your visit today.   Please pick up your prep supplies at the pharmacy within the next 1-3 days.  If you use inhalers (even only as needed), please bring them with you on the day of your procedure.  DO NOT TAKE 7 DAYS PRIOR TO TEST- Trulicity (dulaglutide) Ozempic, Wegovy (semaglutide) Mounjaro (tirzepatide) Bydureon Bcise (exanatide extended release)  DO NOT TAKE 1 DAY PRIOR TO YOUR TEST Rybelsus (semaglutide) Adlyxin (lixisenatide) Victoza (liraglutide) Byetta (exanatide) ___________________________________________________________________________   Speak with the pharmacist about medication call Cholestyramine.    You will be contaced by our office prior to your procedure for directions on holding your Xarelto.  If you do not hear from our office 1 week prior to your scheduled procedure, please call [phone] to discuss.  _______________________________________________________  If your blood pressure at your visit was 140/90 or greater, please contact your primary care physician to follow up on this.  _______________________________________________________  If you are age 19 or older, your body mass index should be between 23-30. Your Body mass index is 24.82 kg/m. If this is out of the aforementioned range listed, please consider follow up with your Primary Care Provider.  If you are age 22 or younger, your body mass index should be between 19-25. Your Body mass index is 24.82 kg/m. If this is out of the aformentioned range listed, please consider follow up with your Primary Care Provider.   ________________________________________________________  The [facility] providers would like to encourage you to use [facility] to communicate with providers for non-urgent requests or questions.  Due to long hold times on the telephone, sending your provider a message by [facility] may  be a faster and more efficient way to get a response.  Please allow 48 business hours for a response.  Please remember that this is for non-urgent requests.  _______________________________________________________  Thank you for choosing me and [facility].  Dr. Meridee Score

## 2022-10-25 NOTE — Progress Notes (Addendum)
GASTROENTEROLOGY OUTPATIENT CLINIC VISIT   Primary Care Provider Dettinger, Elige Radon, MD [address] [place] [address] [phone]   Patient Profile: Greg Robertson is a 72 y.o. male with a pmh significant for atrial fibrillation, CHF, hypertension, hyperlipidemia, diabetes Lynch syndrome and prior colon cancer (status post right colectomy), duodenal adenoma.  The patient presents to the [facility] for an evaluation and management of problem(s) noted below:  Problem List 1. Lynch syndrome   2. History of colon cancer   3. Duodenal adenoma   4. History of colectomy   5. Change in bowel habits   6. Chronic anticoagulation     History of Present Illness Please see prior GI notes for full details of HPI.  Interval History The patient returns for follow-up.  Overall he has been doing well.  He does have multiple bowel movements per day between 2 and 4.  These are looser.  No blood in his bowels has been noted.  No new abdominal pain or discomfort.  GI Review of Systems Positive as above Negative for pyrosis, dysphagia, odynophagia, nausea, vomiting, pain, melena, hematochezia  Review of Systems General: Denies fevers/chills/weight loss unintentionally Cardiovascular: Denies chest pain Pulmonary: Denies shortness of breath Gastroenterological: See HPI Genitourinary: Denies darkened urine Hematological: Positive for history of easy bruising/bleeding due to anticoagulation Endocrine: Denies temperature intolerance Dermatological: Denies jaundice Psychological: Mood is stable   Medications Current Outpatient Medications  Medication Sig Dispense Refill   acetaminophen (TYLENOL) 500 MG tablet Take 1,000 mg by mouth every 6 (six) hours as needed for mild pain or headache. Pain     atorvastatin (LIPITOR) 40 MG tablet TAKE ONE TABLET BY MOUTH ONCE DAILY. 90 tablet 3   carvedilol (COREG) 12.5 MG tablet Take 1 tablet (12.5 mg total) by mouth 2 (two)  times daily with a meal. Dose change-updated dispill 180 tablet 3   Cholecalciferol 50 MCG (2000 UT) TABS Take 2 tablets by mouth daily.     furosemide (LASIX) 40 MG tablet Take 1 tablet (40 mg total) by mouth daily in the afternoon. 90 tablet 3   glucose blood test strip Use as instructed 100 each 12   Insulin Pen Needle 32G X 4 MM MISC Use as directed 150 each 2   Lancets MISC 1 Lancet by Does not apply route 3 (three) times daily. 100 each 3   levocetirizine (XYZAL) 5 MG tablet Take 1 tablet (5 mg total) by mouth daily. 90 tablet 3   Multiple Vitamin (MULTIVITAMIN WITH MINERALS) TABS tablet Take 1 tablet by mouth daily. (0800)     NOVOLOG FLEXPEN 100 UNIT/ML FlexPen INJECT 20-30 UNITS INTO THE SKIN THREE TIMES DAILY WITH MEALS 15 mL 0   rivaroxaban (XARELTO) 20 MG TABS tablet TAKE ONE TABLET BY MOUTH EVERY EVENING WITH SUPPER 30 tablet 11   ROCKLATAN 0.02-0.005 % SOLN Apply 1 drop to eye at bedtime.     sodium zirconium cyclosilicate (LOKELMA) 10 g PACK packet Take 10 g by mouth daily. 90 packet 3   triamcinolone (NASACORT) 55 MCG/ACT AERO nasal inhaler INSTILL 1 SPRAY IN EACH NOSTRIL ONCE OR TWICE DAILY AS NEEDED. 16.5 Bottle 11   vitamin C (ASCORBIC ACID) 500 MG tablet Take 500 mg by mouth in the morning. (0800)     ENTRESTO 49-51 MG Take 1 tablet by mouth 2 (two) times daily. Holding, potassium elevated     insulin degludec (TRESIBA FLEXTOUCH) 100 UNIT/ML FlexTouch Pen Inject 15-25 Units into the skin at  bedtime. 20 mL 1   spironolactone (ALDACTONE) 25 MG tablet Take 12.5 mg by mouth daily. Holding, potassium elevated     No current facility-administered medications for this visit.    Allergies Allergies  Allergen Reactions   Oxycodone Nausea And Vomiting    Histories Past Medical History:  Diagnosis Date   Allergic rhinitis    Anemia    Atrial fibrillation (HCC)    Persistent   Cancer of ascending colon, 7cm 05/25/2011   Congestive heart failure (CHF) (HCC)    Diabetes  mellitus type I (HCC)    Glaucoma    Hypertension    Pneumonia 1979 or 1980   Prostate nodule    Substance abuse (HCC)    Past Surgical History:  Procedure Laterality Date   broken left shoulder blade and collar bone     COLONOSCOPY WITH PROPOFOL N/A 04/29/2021   Procedure: COLONOSCOPY WITH PROPOFOL;  Surgeon: Rachael Fee, MD;  Location: [facility];  Service: Endoscopy;  Laterality: N/A;   ESOPHAGOGASTRODUODENOSCOPY (EGD) WITH PROPOFOL N/A 04/29/2021   Procedure: ESOPHAGOGASTRODUODENOSCOPY (EGD) WITH PROPOFOL;  Surgeon: Rachael Fee, MD;  Location: [facility];  Service: Endoscopy;  Laterality: N/A;   FINGER SURGERY  2009   right middle   HEMOSTASIS CLIP PLACEMENT  04/29/2021   Procedure: HEMOSTASIS CLIP PLACEMENT;  Surgeon: Rachael Fee, MD;  Location: [facility];  Service: Endoscopy;;   LAPAROSCOPIC ASSISTED 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     NASAL FRACTURE SURGERY  1968   POLYPECTOMY  04/29/2021   Procedure: POLYPECTOMY;  Surgeon: Rachael Fee, MD;  Location: [facility];  Service: Endoscopy;;   PORTACATH PLACEMENT  07/01/2011   Procedure: INSERTION PORT-A-CATH;  Surgeon: Ardeth Sportsman, MD;  Location: [facility];  Service: General;  Laterality: Left;  Insertion of Port-A-Catheter Left Internal Jugular   RIGHT/LEFT HEART CATH AND CORONARY ANGIOGRAPHY N/A 12/08/2020   Procedure: RIGHT/LEFT HEART CATH AND CORONARY ANGIOGRAPHY;  Surgeon: Laurey Morale, MD;  Location: [facility] INVASIVE CV LAB;  Service: Cardiovascular;  Laterality: N/A;   TONSILLECTOMY  1957 - approximate   Social History   Socioeconomic History   Marital status: Single    Spouse name: Not on file   Number of children: 0   Years of education: 16   Highest education level: Bachelor's degree (e.g., BA, AB, BS)  Occupational History   Occupation: Retired    Comment: Airline pilot  Tobacco Use   Smoking status: Former    Current packs/day: 0.00    Average packs/day: 1 pack/day for 15.0 years (15.0  ttl pk-yrs)    Types: Cigarettes    Start date: 04/18/1978    Quit date: 04/18/1993    Years since quitting: 29.5   Smokeless tobacco: Never   Tobacco comments:    marijuana every night   Vaping Use   Vaping status: Never Used  Substance and Sexual Activity   Alcohol use: Not Currently    Alcohol/week: 5.0 standard drinks of alcohol    Types: 3 Cans of beer, 2 Shots of liquor per week    Comment: 1 drink every 2 days   Drug use: Not Currently    Types: Marijuana    Comment: once a night marijuana   Sexual activity: Not Currently  Other Topics Concern   Not on file  Social History Narrative   Pt lives alone    R handed   Caffeine: no coffee/or soda. Has sweet tea for dinner.    Social Determinants of Health  Financial Resource Strain: Low Risk  (02/23/2022)   Overall Financial Resource Strain (CARDIA)    Difficulty of Paying Living Expenses: Not hard at all  Food Insecurity: No Food Insecurity (02/23/2022)   Hunger Vital Sign    Worried About Running Out of Food in the Last Year: Never true    Ran Out of Food in the Last Year: Never true  Transportation Needs: No Transportation Needs (02/23/2022)   PRAPARE - Administrator, Civil Service (Medical): No    Lack of Transportation (Non-Medical): No  Physical Activity: Sufficiently Active (02/23/2022)   Exercise Vital Sign    Days of Exercise per Week: 4 days    Minutes of Exercise per Session: 60 min  Stress: No Stress Concern Present (02/23/2022)   [employer] of Occupational Health - Occupational Stress Questionnaire    Feeling of Stress : Not at all  Social Connections: Moderately Isolated (02/23/2022)   Social Connection and Isolation Panel [NHANES]    Frequency of Communication with Friends and Family: More than three times a week    Frequency of Social Gatherings with Friends and Family: More than three times a week    Attends Religious Services: More than 4 times per year    Active Member of [employer] or  Organizations: No    Attends Banker Meetings: Never    Marital Status: Never married  Intimate Partner Violence: Not At Risk (02/22/2022)   Humiliation, Afraid, Rape, and Kick questionnaire    Fear of Current or Ex-Partner: No    Emotionally Abused: No    Physically Abused: No    Sexually Abused: No   Family History  Problem Relation Age of Onset   Breast cancer Mother    Pancreatitis Mother        intestinal adhesions   Insomnia Mother    Colon polyps Father    Lung cancer Father    Diabetes Father    Prostate cancer Father    Colon cancer Neg Hx    Rectal cancer Neg Hx    Esophageal cancer Neg Hx    Inflammatory bowel disease Neg Hx    Liver disease Neg Hx    Pancreatic cancer Neg Hx    Stomach cancer Neg Hx    I have reviewed his medical, social, and family history in detail and updated the electronic medical record as necessary.    PHYSICAL EXAMINATION  BP (!) 108/58 (BP Location: Left Arm, Patient Position: Sitting, Cuff Size: Normal)   Pulse 76   Ht 6\' 1"  (1.854 m)   Wt 188 lb 2 oz (85.3 kg)   BMI 24.82 kg/m  Wt Readings from Last 3 Encounters:  10/27/22 189 lb (85.7 kg)  10/25/22 188 lb 2 oz (85.3 kg)  10/13/22 187 lb (84.8 kg)  GEN: NAD, appears stated age, doesn't appear chronically ill PSYCH: Cooperative, without pressured speech EYE: Conjunctivae pink, sclerae anicteric ENT: MMM CV: Nontachycardic RESP: No audible wheezing GI: NABS, soft, NT, surgical scars present, without rebound MSK/EXT: No significant lower extremity edema SKIN: No jaundice NEURO:  Alert & Oriented x 3, no focal deficits   REVIEW OF DATA  I reviewed the following data at the time of this encounter:  GI Procedures and Studies  January 2023 EGD - A single 13 mm sessile polyp was found in the third portion of the duodenum, adjacent to the 2019 tattoo site. I resected the polyp with snare/cautery (endo cut Q setting) and then removed it with  a [employer]. The  polypectomy site slowly, persistently oozed blood and so I placed three endo clips (Resolution 360s) at the site in good position which resulted in immediate, complete cessation of bleeding. - The examination was otherwise normal.  January 2023 colonoscopy - Normal right sided ileocolonic anastomosis. - Otherwise normal examination.  Laboratory Studies  Reviewed those in epic  Imaging Studies  No new imaging studies to review   ASSESSMENT  Mr. Brockbank is a 72 y.o. male with a pmh significant for atrial fibrillation, CHF, hypertension, hyperlipidemia, diabetes Lynch syndrome and prior colon cancer (status post right colectomy), duodenal adenoma.  The patient is seen today for evaluation and management of:  1. Lynch syndrome   2. History of colon cancer   3. Duodenal adenoma   4. History of colectomy   5. Change in bowel habits   6. Chronic anticoagulation    The patient is hemodynamically and clinically stable at this time.  He is slightly overdue for his colon cancer screening/surveillance and we will work on getting that set up.  As the patient also has a history of a previous duodenal adenoma that has been present on last 2 endoscopies, we will plan to reevaluate the upper GI tract.  If no further duodenal adenoma is found then he will not need another evaluation for 2 years.  We will work on getting approval for anticoagulation hold as he has had previously for Dr. Christella Hartigan procedures.  The risks and benefits of endoscopic evaluation were discussed with the patient; these include but are not limited to the risk of perforation, infection, bleeding, missed lesions, lack of diagnosis, severe illness requiring hospitalization, as well as anesthesia and sedation related illnesses.  The patient and/or family is agreeable to proceed.  All patient questions were answered to the best of my ability, and the patient agrees to the aforementioned plan of action with follow-up as indicated.   PLAN   Schedule EGD for surveillance of duodenal adenoma Schedule colonoscopy for surveillance/screening in setting of Lynch syndrome and prior colon cancer Obtain approval for Xarelto hold 2 days prior to procedure Discussed that in future if patient would like to trial cholestyramine may be effective/helpful due to not having an IC valve anymore   Orders Placed This Encounter  Procedures   Procedural/ Surgical Case Request: COLONOSCOPY WITH PROPOFOL, ESOPHAGOGASTRODUODENOSCOPY (EGD) WITH PROPOFOL   Ambulatory referral to Gastroenterology    New Prescriptions   No medications on file   Modified Medications   Modified Medication Previous Medication   INSULIN DEGLUDEC (TRESIBA FLEXTOUCH) 100 UNIT/ML FLEXTOUCH PEN TRESIBA FLEXTOUCH 100 UNIT/ML FlexTouch Pen      Inject 15-25 Units into the skin at bedtime.    INJECT 25 UNITS INTO THE SKIN EVERY MORNING.    Planned Follow Up No follow-ups on file.   Total Time in Face-to-Face and in Coordination of Care for patient including independent/personal interpretation/review of prior testing, medical history, examination, medication adjustment, communicating results with the patient directly, and documentation within the EHR is 25 minutes.   Corliss Parish, MD [facility] Office # [phone]

## 2022-10-26 ENCOUNTER — Telehealth: Payer: Self-pay

## 2022-10-26 NOTE — Telephone Encounter (Signed)
Request for surgical clearance:     Endoscopy Procedure  What type of surgery is being performed?     Colonoscopy +EGD  When is this surgery scheduled?     12/29/2022  What type of clearance is required ?   Pharmacy  Are there any medications that need to be held prior to surgery and how long? Xarelto x2 days prior to procedure.  Practice name and name of physician performing surgery?      [facility]  What is your office phone and fax number?      Phone- [phone]  Fax- [phone]  Anesthesia type (None, local, MAC, general) ?       MAC

## 2022-10-27 ENCOUNTER — Ambulatory Visit (INDEPENDENT_AMBULATORY_CARE_PROVIDER_SITE_OTHER): Payer: Medicare Other | Admitting: Family Medicine

## 2022-10-27 ENCOUNTER — Encounter: Payer: Self-pay | Admitting: Family Medicine

## 2022-10-27 VITALS — BP 94/60 | HR 76 | Ht 73.0 in | Wt 189.0 lb

## 2022-10-27 DIAGNOSIS — Z23 Encounter for immunization: Secondary | ICD-10-CM

## 2022-10-27 DIAGNOSIS — E782 Mixed hyperlipidemia: Secondary | ICD-10-CM | POA: Diagnosis not present

## 2022-10-27 DIAGNOSIS — E1069 Type 1 diabetes mellitus with other specified complication: Secondary | ICD-10-CM | POA: Diagnosis not present

## 2022-10-27 DIAGNOSIS — I10 Essential (primary) hypertension: Secondary | ICD-10-CM | POA: Diagnosis not present

## 2022-10-27 DIAGNOSIS — I5042 Chronic combined systolic (congestive) and diastolic (congestive) heart failure: Secondary | ICD-10-CM

## 2022-10-27 LAB — BAYER DCA HB A1C WAIVED: HB A1C (BAYER DCA - WAIVED): 9.9 % — ABNORMAL HIGH (ref 4.8–5.6)

## 2022-10-27 MED ORDER — TRESIBA FLEXTOUCH 100 UNIT/ML ~~LOC~~ SOPN: 15.0000 [IU] | PEN_INJECTOR | Freq: Every day | SUBCUTANEOUS | 1 refills | Status: DC

## 2022-10-27 NOTE — Telephone Encounter (Signed)
   Name: Greg Robertson  DOB: Nov 29, 1950  MRN: [id number]   Primary Cardiologist: [name], MD  Chart reviewed as part of pre-operative protocol coverage.   We have been asked for guidance to hold oral anticoagulation for upcoming procedure. Per our clinical pharmacist:  Per office protocol, patient can hold Xarelto for 2 days prior to procedure.    I will route this recommendation to the requesting party via Epic fax function and remove from pre-op pool. Please call with questions.  Roe Rutherford Melbert Botelho, PA 10/27/2022, 4:28 PM

## 2022-10-27 NOTE — Telephone Encounter (Signed)
Patient with diagnosis of atrial fibrillation on Xarelto for anticoagulation.    Procedure: colonoscopy + EGD Date of procedure: 12/29/22   CHA2DS2-VASc Score = 7   This indicates a 11.2% annual risk of stroke. The patient's score is based upon: CHF History: 1 HTN History: 1 Diabetes History: 1 Stroke History: 2 Vascular Disease History: 1 Age Score: 1 Gender Score: 0   Per chart stroke occurred 09/2020  CrCl 49 Platelet count 176  Per office protocol, patient can hold Xarelto for 2 days prior to procedure.   Patient will not need bridging with Lovenox (enoxaparin) around procedure.  **This guidance is not considered finalized until pre-operative APP has relayed final recommendations.**

## 2022-10-27 NOTE — Progress Notes (Signed)
BP 94/60   Pulse 76   Ht 6\' 1"  (1.854 m)   Wt 189 lb (85.7 kg)   SpO2 98%   BMI 24.94 kg/m    Subjective:   Patient ID: Greg Robertson, male    DOB: Sep 14, 1950, 72 y.o.   MRN: [id number]  HPI: Greg Robertson is a 72 y.o. male presenting on 10/27/2022 for Medical Management of Chronic Issues and Diabetes   HPI Type 1 diabetes mellitus Patient comes in today for recheck of his diabetes. Patient has been currently taking NovoLog and Tresiba, uses 20 [place] daily and carb counting for NovoLog at 1 g per 4-1/2 units. Patient is currently on an ACE inhibitor/ARB. Patient has not seen an ophthalmologist this year. Patient denies any new issues with their feet. The symptom started onset as an adult hypertension and CHF and hyperlipidemia ARE RELATED TO DM   Hypertension and CHF, sees cardiology Patient is currently on carvedilol and Entresto and furosemide, and their blood pressure today is 94/60, he is holding the [place] for now. Patient denies any lightheadedness or dizziness. Patient denies headaches, blurred vision, chest pains, shortness of breath, or weakness. Denies any side effects from medication and is content with current medication.   Hyperlipidemia Patient is coming in for recheck of his hyperlipidemia. The patient is currently taking atorvastatin. They deny any issues with myalgias or history of liver damage from it. They deny any focal numbness or weakness or chest pain.     Relevant past medical, surgical, family and social history reviewed and updated as indicated. Interim medical history since our last visit reviewed. Allergies and medications reviewed and updated.  Review of Systems  Constitutional:  Negative for chills and fever.  Eyes:  Negative for discharge.  Respiratory:  Negative for shortness of breath and wheezing.   Cardiovascular:  Negative for chest pain and leg swelling.  Musculoskeletal:  Negative for back pain and gait problem.  Skin:   Negative for rash.  All other systems reviewed and are negative.   Per HPI unless specifically indicated above   Allergies as of 10/27/2022       Reactions   Oxycodone Nausea And Vomiting        Medication List        Accurate as of October 27, 2022 11:30 AM. If you have any questions, ask your nurse or doctor.          acetaminophen 500 MG tablet Commonly known as: TYLENOL Take 1,000 mg by mouth every 6 (six) hours as needed for mild pain or headache. Pain   ascorbic acid 500 MG tablet Commonly known as: VITAMIN C Take 500 mg by mouth in the morning. (0800)   atorvastatin 40 MG tablet Commonly known as: LIPITOR TAKE ONE TABLET BY MOUTH ONCE DAILY.   carvedilol 12.5 MG tablet Commonly known as: COREG Take 1 tablet (12.5 mg total) by mouth 2 (two) times daily with a meal. Dose change-updated dispill   Cholecalciferol 50 MCG (2000 UT) Tabs Take 2 tablets by mouth daily.   Entresto 49-51 MG Generic drug: sacubitril-valsartan Take 1 tablet by mouth 2 (two) times daily. Holding, potassium elevated   furosemide 40 MG tablet Commonly known as: LASIX Take 1 tablet (40 mg total) by mouth daily in the afternoon.   glucose blood test strip Use as instructed   Insulin Pen Needle 32G X 4 MM Misc Use as directed   Lancets Misc 1 Lancet by Does not apply route 3 (three)  times daily.   levocetirizine 5 MG tablet Commonly known as: XYZAL Take 1 tablet (5 mg total) by mouth daily.   Lokelma 10 g Pack packet Generic drug: sodium zirconium cyclosilicate Take 10 g by mouth daily.   multivitamin with minerals Tabs tablet Take 1 tablet by mouth daily. (0800)   NovoLOG FlexPen 100 UNIT/ML FlexPen Generic drug: insulin aspart INJECT 20-30 UNITS INTO THE SKIN THREE TIMES DAILY WITH MEALS   rivaroxaban 20 MG Tabs tablet Commonly known as: Xarelto TAKE ONE TABLET BY MOUTH EVERY EVENING WITH SUPPER   Rocklatan 0.02-0.005 % Soln Generic drug:  Netarsudil-Latanoprost Apply 1 drop to eye at bedtime.   spironolactone 25 MG tablet Commonly known as: ALDACTONE Take 12.5 mg by mouth daily. Holding, potassium elevated   Tresiba FlexTouch 100 UNIT/ML FlexTouch Pen Generic drug: insulin degludec Inject 15-25 Units into the skin at bedtime. What changed: See the new instructions. Changed by: Elige Radon Scarlette Hogston   triamcinolone 55 MCG/ACT Aero nasal inhaler Commonly known as: NASACORT INSTILL 1 SPRAY IN EACH NOSTRIL ONCE OR TWICE DAILY AS NEEDED.         Objective:   BP 94/60   Pulse 76   Ht 6\' 1"  (1.854 m)   Wt 189 lb (85.7 kg)   SpO2 98%   BMI 24.94 kg/m   Wt Readings from Last 3 Encounters:  10/27/22 189 lb (85.7 kg)  10/25/22 188 lb 2 oz (85.3 kg)  10/13/22 187 lb (84.8 kg)    Physical Exam Vitals and nursing note reviewed.  Constitutional:      General: He is not in acute distress.    Appearance: He is well-developed. He is not diaphoretic.  Eyes:     General: No scleral icterus.       Right eye: No discharge.     Conjunctiva/sclera: Conjunctivae normal.     Pupils: Pupils are equal, round, and reactive to light.  Neck:     Thyroid: No thyromegaly.  Cardiovascular:     Rate and Rhythm: Normal rate and regular rhythm.     Heart sounds: Normal heart sounds. No murmur heard. Pulmonary:     Effort: Pulmonary effort is normal. No respiratory distress.     Breath sounds: Normal breath sounds. No wheezing.  Musculoskeletal:        General: Normal range of motion.     Cervical back: Neck supple.  Lymphadenopathy:     Cervical: No cervical adenopathy.  Skin:    General: Skin is warm and dry.     Findings: No rash.  Neurological:     Mental Status: He is alert and oriented to person, place, and time.     Coordination: Coordination normal.  Psychiatric:        Behavior: Behavior normal.       Assessment & Plan:   Problem List Items Addressed This Visit       Cardiovascular and Mediastinum    Hypertension, essential   Relevant Orders   BMP8+EGFR   Chronic CHF (congestive heart failure) (HCC)     Endocrine   DM type 1 (diabetes mellitus, type 1) (HCC) - Primary (Chronic)   Relevant Medications   insulin degludec (TRESIBA FLEXTOUCH) 100 UNIT/ML FlexTouch Pen   Other Relevant Orders   Bayer DCA Hb A1c Waived   BMP8+EGFR     Other   Mixed hyperlipidemia   Other Visit Diagnoses     Need for shingles vaccine       Relevant Orders  Zoster Recombinant (Shingrix ) (Completed)       A1c 9.9 which is improved from last time which was 12.  Blood pressure low so is holding the Entresto.  Will recheck his potassium today.  Seems like he is getting low sometimes during the mid afternoon so we will reduce the amount of carbs that he uses to cover lunchtime and then it seems like he gets high overnight and he admits that he takes a snack and is not covering carbs from that snack with any insulin.  Recommended that he cover some insulin for the evening. Follow up plan: Return in about 3 months (around 01/27/2023), or if symptoms worsen or fail to improve, for Type 1 diabetes recheck.  Counseling provided for all of the vaccine components Orders Placed This Encounter  Procedures   Zoster Recombinant (Shingrix )   Bayer [facility] Hb A1c Waived   BMP8+EGFR    Arville Care, MD [facility] 10/27/2022, 11:30 AM

## 2022-10-29 ENCOUNTER — Encounter: Payer: Self-pay | Admitting: Gastroenterology

## 2022-10-29 DIAGNOSIS — Z7901 Long term (current) use of anticoagulants: Secondary | ICD-10-CM | POA: Insufficient documentation

## 2022-10-29 DIAGNOSIS — D132 Benign neoplasm of duodenum: Secondary | ICD-10-CM | POA: Insufficient documentation

## 2022-10-29 DIAGNOSIS — R194 Change in bowel habit: Secondary | ICD-10-CM | POA: Insufficient documentation

## 2022-10-29 DIAGNOSIS — Z9049 Acquired absence of other specified parts of digestive tract: Secondary | ICD-10-CM | POA: Insufficient documentation

## 2022-10-31 ENCOUNTER — Ambulatory Visit (HOSPITAL_COMMUNITY)
Admission: RE | Admit: 2022-10-31 | Discharge: 2022-10-31 | Disposition: A | Discharge status: Home / Self Care | Payer: Medicare Other | Source: Ambulatory Visit | Attending: Cardiology | Admitting: Cardiology

## 2022-10-31 VITALS — BP 100/58 | HR 63 | Wt 188.4 lb

## 2022-10-31 DIAGNOSIS — Z79899 Other long term (current) drug therapy: Secondary | ICD-10-CM | POA: Insufficient documentation

## 2022-10-31 DIAGNOSIS — Z85038 Personal history of other malignant neoplasm of large intestine: Secondary | ICD-10-CM | POA: Diagnosis not present

## 2022-10-31 DIAGNOSIS — Z794 Long term (current) use of insulin: Secondary | ICD-10-CM | POA: Diagnosis not present

## 2022-10-31 DIAGNOSIS — I480 Paroxysmal atrial fibrillation: Secondary | ICD-10-CM | POA: Diagnosis not present

## 2022-10-31 DIAGNOSIS — I110 Hypertensive heart disease with heart failure: Secondary | ICD-10-CM | POA: Diagnosis not present

## 2022-10-31 DIAGNOSIS — N183 Chronic kidney disease, stage 3 unspecified: Secondary | ICD-10-CM | POA: Diagnosis not present

## 2022-10-31 DIAGNOSIS — I5022 Chronic systolic (congestive) heart failure: Secondary | ICD-10-CM | POA: Insufficient documentation

## 2022-10-31 DIAGNOSIS — I2510 Atherosclerotic heart disease of native coronary artery without angina pectoris: Secondary | ICD-10-CM | POA: Insufficient documentation

## 2022-10-31 DIAGNOSIS — I428 Other cardiomyopathies: Secondary | ICD-10-CM | POA: Insufficient documentation

## 2022-10-31 DIAGNOSIS — Z8673 Personal history of transient ischemic attack (TIA), and cerebral infarction without residual deficits: Secondary | ICD-10-CM | POA: Diagnosis not present

## 2022-10-31 DIAGNOSIS — E875 Hyperkalemia: Secondary | ICD-10-CM | POA: Insufficient documentation

## 2022-10-31 DIAGNOSIS — E1022 Type 1 diabetes mellitus with diabetic chronic kidney disease: Secondary | ICD-10-CM | POA: Diagnosis not present

## 2022-10-31 DIAGNOSIS — Z7901 Long term (current) use of anticoagulants: Secondary | ICD-10-CM | POA: Diagnosis not present

## 2022-10-31 DIAGNOSIS — E785 Hyperlipidemia, unspecified: Secondary | ICD-10-CM | POA: Insufficient documentation

## 2022-10-31 LAB — BASIC METABOLIC PANEL
Anion gap: 8 (ref 5–15)
BUN: 21 mg/dL (ref 8–23)
CO2: 26 mmol/L (ref 22–32)
Calcium: 8.7 mg/dL — ABNORMAL LOW (ref 8.9–10.3)
Chloride: 106 mmol/L (ref 98–111)
Creatinine, Ser: 1.45 mg/dL — ABNORMAL HIGH (ref 0.61–1.24)
GFR, Estimated: 52 mL/min — ABNORMAL LOW (ref >60)
Glucose, Bld: 49 mg/dL — ABNORMAL LOW (ref 70–99)
Potassium: 4.1 mmol/L (ref 3.5–5.1)
Sodium: 140 mmol/L (ref 135–145)

## 2022-10-31 NOTE — Patient Instructions (Signed)
It was a pleasure seeing you today!  MEDICATIONS: -No medication changes today -Call if you have questions about your medications.  LABS: -We will call you if your labs need attention.  NEXT APPOINTMENT: Return to clinic in 5 weeks with APP Clinic.  In general, to take care of your heart failure: -Limit your fluid intake to 2 Liters (half-gallon) per day.   -Limit your salt intake to ideally 2-3 grams (2000-3000 mg) per day. -Weigh yourself daily and record, and bring that "weight diary" to your next appointment.  (Weight gain of 2-3 pounds in 1 day typically means fluid weight.) -The medications for your heart are to help your heart and help you live longer.   -Please contact us before stopping any of your heart medications.  Call the clinic at [phone] with questions or to reschedule future appointments.

## 2022-11-03 ENCOUNTER — Other Ambulatory Visit: Payer: Self-pay | Admitting: Family Medicine

## 2022-11-03 NOTE — Telephone Encounter (Signed)
Patient has been informed okay to hold Xarelto for 2 days prior to procedure per Cardiology. Patient voiced understanding.

## 2022-11-10 LAB — HM DIABETES EYE EXAM

## 2022-12-02 NOTE — Progress Notes (Signed)
[facility] Progress Note    Patient ID: GIOVANI FEOLE, male   DOB: 24-Feb-1951, 72 y.o.   MRN: [id number] PCP: Dettinger, Elige Radon, MD Cardiology: Dr. Shirlee Latch  72 y.o. with history of HTN, type I diabetes, and paroxysmal atrial fibrillation/CVA was referred by Dr. Frances Furbish for evaluation of cardiomyopathy.  Patient also has had colon cancer with ileocolectomy in 2/13.  In 11/13, he fell and broke his clavicle (tripped).  Soon after this, he was admitted with DKA and profound dehydration/metabolic derangement.  He was noted to be in atrial fibrillation with RVR.  His cardiac enzymes were elevated, troponin peaked at 1.57. He initially went back into NSR but then had recurrent episodes of atrial fibrillation.  He was also started on Eliquis due to risk of embolic CVA, however he subsequently stopped Eliquis.   He was admitted in 6/22 with right PCA CVA and hemorrhagic conversion.  Initially just started on ASA, later this was stopped and Xarelto started.  Main residual from CVA is mild left upper visual field impairment.   CTA neck showed < 50% bilateral carotid stenosis.  Echo showed EF 30-35%, diffuse hypokinesis.  RHC/LHC in 8/22 showed nonobstructive CAD and normal filling pressures.   Cardiac MRI in 11/22 showed LV EF 38%, RVEF 46%, basal inferolateral mid-wall LGE. Diffuse LGE images.  ECV 37-46%.  Based on cMRI, differential included prior myocarditis and cardiac amyloidosis.  PYP scan was done in 2/23, showing grade 1-2, H/CL 1.25.  [employer] gene testing for TTR mutations was negative. Based on full picture, TTR amyloidosis felt unlikely.   Echo (3/24) showed EF 55-60%, grade I DD, normal RV.  Today he returns for HF follow up with his wife. Overall feeling fine. No SOB with activity. Mild swelling in ankles. Denies palpitations, abnormal bleeding, CP, dizziness, or PND/Orthopnea. Appetite ok. No fever or chills. Weight at home 189-190 pounds. Taking all medications. Remains  off spiro and Entresto with hyperkalemia, despite Lokelma use.  ECG (personally reviewed): NSR 68 bpm  Labs (7/22): K 5.6, creatinine 1.17 Labs (8/22): BNP 283, LDL 67, HDL 58, K 5.3, creatinine 4.09 Labs (10/22): K 5.1, creatinine 1.24 Labs (11/22): urine immunofixation negative, K 4.9, creatinine 1.37 Labs (2/23): K 4.1, creatinine 1.33, myeloma panel negative, LDL 68, TGs 58, hgb 12.8 Labs (5/23): K 4.8, creatinine 1.73, BNP 128 Labs (9/23): Hgb 12.3, creatinine 1.68, K 4.7 Labs (1/24): K 5.5, creatinine 1.63, glucose 467 Labs (3/24): K 4.9, creatinine 1.51, LDL 104 Labs (7/24): K 4.1, creatinine 1.45  PMH: 1. HTN 2. Type I diabetes 3. Anemia, thrombocytopenia, leukopenia: related to chemotherapy.  4. Clavicular fracture 11/13 due to mechanical fall.  5. 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  FOLFOX chemotherapy 3/13 - 9/13.  7. Allergic rhinitis 8. Paroxysmal atrial fibrillation: First diagnosed in 11/13 during admission for DKA. Echo (11/13): EF 55% with grade I diastolic dysfunction.  9. Right central retinal artery occlusion 10. 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  - CTA neck with <50% carotid stenosis.  11. Cardiomyopathy: Nonischemic cardiomyopathy.  Echo in 6/22 with EF 30-35%, global hypokinesis, normal RV.   - RHC/LHC (8/22): 40% ostial/proximal LAD, D1 50%.  Mean RA 2, PA 29/9, mean PCWP 8, CI 2.94.  - Cardiac MRI (11/22): LV EF 38%, RVEF 46%, basal inferolateral mid-wall LGE. - PYP (2/23) showing grade 1-2, H/CL 1.25.  The PYP scan was equivocal.  - Echo (3/24) showed EF 55-60%, grade I DD, normal RV  12. COVID-19 2/23 13. OSA 14. CKD stage 3: Diabetic nephropathy  SH: Lives in [place].  Quit smoking in 1995.  H/o marijuana.  Quit drinking ETOH in 2022.    FH: No atrial fibrillation or CAD.   ROS: All systems reviewed and negative except as per HPI.   Current Outpatient Medications  Medication Sig Dispense Refill    acetaminophen (TYLENOL) 500 MG tablet Take 1,000 mg by mouth every 6 (six) hours as needed for mild pain or headache. Pain     atorvastatin (LIPITOR) 40 MG tablet TAKE ONE TABLET BY MOUTH ONCE DAILY. 90 tablet 3   carvedilol (COREG) 12.5 MG tablet Take 1 tablet (12.5 mg total) by mouth 2 (two) times daily with a meal. Dose change-updated dispill (Patient taking differently: Take by mouth 2 (two) times daily with a meal. Patient takes 1 tablet in the morning and 0.5 tablet at night.) 180 tablet 3   Cholecalciferol 50 MCG (2000 UT) TABS Take 2 tablets by mouth daily.     furosemide (LASIX) 40 MG tablet Take 1 tablet (40 mg total) by mouth daily in the afternoon. 90 tablet 3   GARLIC PO Take 1 capsule by mouth daily.     glucose blood test strip Use as instructed 100 each 12   insulin aspart (NOVOLOG FLEXPEN) 100 UNIT/ML FlexPen INJECT 20-30 UNITS INTO THE SKIN THREE TIMES DAILY WITH MEALS 15 mL 2   insulin degludec (TRESIBA FLEXTOUCH) 100 UNIT/ML FlexTouch Pen Inject 15-25 Units into the skin at bedtime. 20 mL 1   Insulin Pen Needle 32G X 4 MM MISC Use as directed 150 each 2   Lancets MISC 1 Lancet by Does not apply route 3 (three) times daily. 100 each 3   levocetirizine (XYZAL) 5 MG tablet Take 1 tablet (5 mg total) by mouth daily. 90 tablet 3   Multiple Vitamin (MULTIVITAMIN WITH MINERALS) TABS tablet Take 1 tablet by mouth daily. (0800)     rivaroxaban (XARELTO) 20 MG TABS tablet TAKE ONE TABLET BY MOUTH EVERY EVENING WITH SUPPER 30 tablet 11   ROCKLATAN 0.02-0.005 % SOLN Apply 1 drop to eye at bedtime.     sodium zirconium cyclosilicate (LOKELMA) 10 g PACK packet Take 10 g by mouth daily. 90 packet 3   triamcinolone (NASACORT) 55 MCG/ACT AERO nasal inhaler INSTILL 1 SPRAY IN EACH NOSTRIL ONCE OR TWICE DAILY AS NEEDED. 16.5 Bottle 11   vitamin C (ASCORBIC ACID) 500 MG tablet Take 500 mg by mouth in the morning. (0800)     No current facility-administered medications for this encounter.   Wt  Readings from Last 3 Encounters:  12/05/22 85.5 kg (188 lb 6.4 oz)  10/31/22 85.5 kg (188 lb 6.4 oz)  10/27/22 85.7 kg (189 lb)   BP (!) 148/88   Pulse 62   Wt 85.5 kg (188 lb 6.4 oz)   SpO2 99%   BMI 24.86 kg/m  Physical Exam: General:  NAD. No resp difficulty, walked into clinic HEENT: Normal Neck: Supple. No JVD. Carotids 2+ bilat; no bruits. No lymphadenopathy or thryomegaly appreciated. Cor: PMI nondisplaced. Regular rate & rhythm. No rubs, gallops or murmurs. Lungs: Clear Abdomen: Soft, nontender, nondistended. No hepatosplenomegaly. No bruits or masses. Good bowel sounds. Extremities: No cyanosis, clubbing, rash, edema Neuro: Alert & oriented x 3, cranial nerves grossly intact. Moves all 4 extremities w/o difficulty. Affect pleasant.  Assessment/Plan: 1. Atrial fibrillation:  Paroxysmal. Denies breakthrough symptoms. NSR on ECG today. - Continue Coreg 12.5 mg bid.  -  Continue Xarelto 20 mg daily. No bleeding issues. - If atrial fibrillation recurs frequently, could use anti-arrhythmic versus ablation.  2. Chronic systolic CHF: Nonischemic cardiomyopathy.  Echo in 6/22 at time of CVA showed EF 30-35%, diffuse hypokinesis.  LHC/RHC in 8/22 showed nonobstructive CAD and normal filling pressures.  Cardiac MRI in 11/22 showed LV EF 38%, RVEF 46%, basal inferolateral mid-wall LGE.  Possible prior viral myocarditis based on MRI, cannot rule out cardiac amyloidosis.  PYP scan was equivocal but likely negative, [employer] gene testing negative for transthyretin mutation, myeloma panel was negative => suspect TTR cardiac amyloidosis is unlikely. Echo (3/24) showed EF 55-60%, grade I DD, normal RV.  NYHA class I-II symptoms, not volume overloaded by exam.  GDMT titration limited by CKD and soft BP.  - Off spiro and Entresto due to hyperkalemia, despite daily Lokelma use. - Will restart Entresto 24/26 mg bid. BMET today, repeat in 7 days. - Continue Lokelma 10 g daily. - Continue Coreg 12.5 mg  bid.  - Continue Lasix 40 mg daily.  - No SGLT2 inhibitor with type 1 diabetes.  - EF out of range for ICD on 11/22 cMRI and echo 3/24.  Narrow QRS so not CRT candidate.   3. Hyperlipidemia: Continue  atorvastatin 80 mg daily. LDL 104 (3/24), goal LDL < 55. - Long discussion about addressing cardiac risk factors. He is agreeable to consider PCSK9i. He has been referred to [facility] for PCSK9i 4. CAD: Nonobstructive on cath 8/22. No chest pain.  - Continue statin. - No ASA given Xarelto use.  5. Type 1 diabetes: Glucose control seems poor.  Most recent A1c 12.3. - Recommended that he seen an endocrinologist.   6. CKD stage 3: Diabetic nephropathy with a tendency towards hyperkalemia.  - Recommended followup with a nephrologist. BMET today.   Follow up in 3 months with Dr. Kathreen Cornfield [facility], FNP-BC 12/05/2022

## 2022-12-05 ENCOUNTER — Ambulatory Visit (HOSPITAL_BASED_OUTPATIENT_CLINIC_OR_DEPARTMENT_OTHER)
Admission: RE | Admit: 2022-12-05 | Discharge: 2022-12-05 | Disposition: A | Discharge status: Home / Self Care | Payer: Medicare Other | Source: Ambulatory Visit | Attending: Family Medicine | Admitting: Family Medicine

## 2022-12-05 ENCOUNTER — Encounter (HOSPITAL_COMMUNITY): Payer: Self-pay

## 2022-12-05 VITALS — BP 148/88 | HR 62 | Wt 188.4 lb

## 2022-12-05 DIAGNOSIS — I480 Paroxysmal atrial fibrillation: Secondary | ICD-10-CM | POA: Insufficient documentation

## 2022-12-05 DIAGNOSIS — S82851A Displaced trimalleolar fracture of right lower leg, initial encounter for closed fracture: Secondary | ICD-10-CM | POA: Diagnosis not present

## 2022-12-05 DIAGNOSIS — E1022 Type 1 diabetes mellitus with diabetic chronic kidney disease: Secondary | ICD-10-CM | POA: Insufficient documentation

## 2022-12-05 DIAGNOSIS — E1059 Type 1 diabetes mellitus with other circulatory complications: Secondary | ICD-10-CM | POA: Diagnosis not present

## 2022-12-05 DIAGNOSIS — I2510 Atherosclerotic heart disease of native coronary artery without angina pectoris: Secondary | ICD-10-CM | POA: Insufficient documentation

## 2022-12-05 DIAGNOSIS — I428 Other cardiomyopathies: Secondary | ICD-10-CM | POA: Insufficient documentation

## 2022-12-05 DIAGNOSIS — M25571 Pain in right ankle and joints of right foot: Secondary | ICD-10-CM | POA: Diagnosis not present

## 2022-12-05 DIAGNOSIS — E785 Hyperlipidemia, unspecified: Secondary | ICD-10-CM | POA: Insufficient documentation

## 2022-12-05 DIAGNOSIS — I130 Hypertensive heart and chronic kidney disease with heart failure and stage 1 through stage 4 chronic kidney disease, or unspecified chronic kidney disease: Secondary | ICD-10-CM | POA: Insufficient documentation

## 2022-12-05 DIAGNOSIS — I5022 Chronic systolic (congestive) heart failure: Secondary | ICD-10-CM | POA: Insufficient documentation

## 2022-12-05 DIAGNOSIS — Z7901 Long term (current) use of anticoagulants: Secondary | ICD-10-CM | POA: Insufficient documentation

## 2022-12-05 DIAGNOSIS — E1069 Type 1 diabetes mellitus with other specified complication: Secondary | ICD-10-CM

## 2022-12-05 DIAGNOSIS — N183 Chronic kidney disease, stage 3 unspecified: Secondary | ICD-10-CM | POA: Insufficient documentation

## 2022-12-05 LAB — BASIC METABOLIC PANEL
Anion gap: 9 (ref 5–15)
BUN: 17 mg/dL (ref 8–23)
CO2: 27 mmol/L (ref 22–32)
Calcium: 8.5 mg/dL — ABNORMAL LOW (ref 8.9–10.3)
Chloride: 99 mmol/L (ref 98–111)
Creatinine, Ser: 1.21 mg/dL (ref 0.61–1.24)
GFR, Estimated: >60 mL/min (ref >60)
Glucose, Bld: 368 mg/dL — ABNORMAL HIGH (ref 70–99)
Potassium: 4.1 mmol/L (ref 3.5–5.1)
Sodium: 135 mmol/L (ref 135–145)

## 2022-12-05 MED ORDER — ENTRESTO 24-26 MG PO TABS: 1.0000 | ORAL_TABLET | Freq: Two times a day (BID) | ORAL | 11 refills | Status: DC

## 2022-12-05 NOTE — Patient Instructions (Addendum)
EKG done today.  Labs done today. We will contact you only if your labs are abnormal.  RESTART Entresto 24-26mg  (1 tablet) by mouth 2 times daily.   No other medication changes were made. Please continue all current medications as prescribed.  Your physician recommends that you schedule a follow-up appointment in: 7-10 days for a lab only(can done at your primary care physician) appointment and in 3 months with Dr. Shirlee Latch  If you have any questions or concerns before your next appointment please send [place] a message through [facility] or call our office at [phone].    TO LEAVE A MESSAGE FOR THE NURSE SELECT OPTION 2, PLEASE LEAVE A MESSAGE INCLUDING: YOUR NAME DATE OF BIRTH CALL BACK NUMBER REASON FOR CALL**this is important as we prioritize the call backs  YOU WILL RECEIVE A CALL BACK THE SAME DAY AS LONG AS YOU CALL BEFORE 4:00 PM   Do the following things EVERYDAY: Weigh yourself in the morning before breakfast. Write it down and keep it in a log. Take your medicines as prescribed Eat low salt foods--Limit salt (sodium) to 2000 mg per day.  Stay as active as you can everyday Limit all fluids for the day to less than 2 liters   At the [facility], you and your health needs are our priority. As part of our continuing mission to provide you with exceptional heart care, we have created designated Provider Care Teams. These Care Teams include your primary Cardiologist (physician) and Advanced Practice Providers (APPs- Physician Assistants and Nurse Practitioners) who all work together to provide you with the care you need, when you need it.   You may see any of the following providers on your designated Care Team at your next follow up: Dr Arvilla Meres Dr [name] Dr. Marcos Eke, NP Robbie Lis, [place] [facility] [place], [place] Brynda Peon, NP Karle Plumber, PharmD   Please be sure to bring in all your medications bottles to  every appointment.    Thank you for choosing  [facility]

## 2022-12-07 ENCOUNTER — Inpatient Hospital Stay (HOSPITAL_COMMUNITY)
Admission: EM | Admit: 2022-12-07 | Discharge: 2022-12-13 | DRG: 493 | Disposition: A | Discharge status: Skilled Nursing Facility | Payer: Medicare Other | Attending: Internal Medicine | Admitting: Internal Medicine

## 2022-12-07 ENCOUNTER — Emergency Department (HOSPITAL_COMMUNITY): Payer: Medicare Other

## 2022-12-07 DIAGNOSIS — W1839XA Other fall on same level, initial encounter: Secondary | ICD-10-CM | POA: Diagnosis present

## 2022-12-07 DIAGNOSIS — I69898 Other sequelae of other cerebrovascular disease: Secondary | ICD-10-CM

## 2022-12-07 DIAGNOSIS — I130 Hypertensive heart and chronic kidney disease with heart failure and stage 1 through stage 4 chronic kidney disease, or unspecified chronic kidney disease: Secondary | ICD-10-CM | POA: Diagnosis present

## 2022-12-07 DIAGNOSIS — E785 Hyperlipidemia, unspecified: Secondary | ICD-10-CM | POA: Diagnosis present

## 2022-12-07 DIAGNOSIS — Z85038 Personal history of other malignant neoplasm of large intestine: Secondary | ICD-10-CM

## 2022-12-07 DIAGNOSIS — Z801 Family history of malignant neoplasm of trachea, bronchus and lung: Secondary | ICD-10-CM

## 2022-12-07 DIAGNOSIS — S82851A Displaced trimalleolar fracture of right lower leg, initial encounter for closed fracture: Principal | ICD-10-CM | POA: Diagnosis present

## 2022-12-07 DIAGNOSIS — E10649 Type 1 diabetes mellitus with hypoglycemia without coma: Secondary | ICD-10-CM

## 2022-12-07 DIAGNOSIS — I480 Paroxysmal atrial fibrillation: Secondary | ICD-10-CM | POA: Diagnosis present

## 2022-12-07 DIAGNOSIS — Z79899 Other long term (current) drug therapy: Secondary | ICD-10-CM

## 2022-12-07 DIAGNOSIS — Z9221 Personal history of antineoplastic chemotherapy: Secondary | ICD-10-CM

## 2022-12-07 DIAGNOSIS — N182 Chronic kidney disease, stage 2 (mild): Secondary | ICD-10-CM | POA: Diagnosis present

## 2022-12-07 DIAGNOSIS — Z833 Family history of diabetes mellitus: Secondary | ICD-10-CM

## 2022-12-07 DIAGNOSIS — E875 Hyperkalemia: Secondary | ICD-10-CM | POA: Diagnosis present

## 2022-12-07 DIAGNOSIS — I2510 Atherosclerotic heart disease of native coronary artery without angina pectoris: Secondary | ICD-10-CM | POA: Diagnosis present

## 2022-12-07 DIAGNOSIS — Z7901 Long term (current) use of anticoagulants: Secondary | ICD-10-CM

## 2022-12-07 DIAGNOSIS — G4733 Obstructive sleep apnea (adult) (pediatric): Secondary | ICD-10-CM | POA: Diagnosis present

## 2022-12-07 DIAGNOSIS — Z803 Family history of malignant neoplasm of breast: Secondary | ICD-10-CM

## 2022-12-07 DIAGNOSIS — Z8616 Personal history of COVID-19: Secondary | ICD-10-CM

## 2022-12-07 DIAGNOSIS — Z87891 Personal history of nicotine dependence: Secondary | ICD-10-CM

## 2022-12-07 DIAGNOSIS — E1022 Type 1 diabetes mellitus with diabetic chronic kidney disease: Secondary | ICD-10-CM | POA: Diagnosis present

## 2022-12-07 DIAGNOSIS — S82891A Other fracture of right lower leg, initial encounter for closed fracture: Secondary | ICD-10-CM | POA: Diagnosis present

## 2022-12-07 DIAGNOSIS — Y92000 Kitchen of unspecified non-institutional (private) residence as  the place of occurrence of the external cause: Secondary | ICD-10-CM

## 2022-12-07 DIAGNOSIS — I5022 Chronic systolic (congestive) heart failure: Secondary | ICD-10-CM | POA: Diagnosis present

## 2022-12-07 DIAGNOSIS — I428 Other cardiomyopathies: Secondary | ICD-10-CM | POA: Diagnosis present

## 2022-12-07 DIAGNOSIS — E1065 Type 1 diabetes mellitus with hyperglycemia: Secondary | ICD-10-CM | POA: Diagnosis present

## 2022-12-07 DIAGNOSIS — Z8042 Family history of malignant neoplasm of prostate: Secondary | ICD-10-CM

## 2022-12-07 LAB — CBG MONITORING, ED: Glucose-Capillary: 117 mg/dL — ABNORMAL HIGH (ref 70–99)

## 2022-12-07 MED ORDER — FENTANYL CITRATE PF 50 MCG/ML IJ SOSY
50.0000 ug | PREFILLED_SYRINGE | Freq: Once | INTRAMUSCULAR | Status: DC
  Filled 2022-12-07: qty 1

## 2022-12-07 MED ORDER — PROPOFOL 10 MG/ML IV BOLUS
1.0000 mg/kg | Freq: Once | INTRAVENOUS | Status: AC
  Administered 2022-12-07: 7.5 mg via INTRAVENOUS
  Filled 2022-12-07: qty 20

## 2022-12-07 NOTE — ED Provider Notes (Incomplete)
[place] EMERGENCY DEPARTMENT AT [facility] Provider Note   CSN: [id number] Arrival date & time: 12/07/22  2254     History {Add pertinent medical, surgical, social history, OB history to HPI:1} Chief Complaint  Patient presents with  . Fall    Greg Robertson is a 72 y.o. male.  72 year old male present with EMS for fall with right ankle injury. 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 He was feeling better, got back up and felt his blood sugar drop again, went to get some more lemonade but felt suddenly weak and fell to the ground. Denies LOC, did not hit head, not on thinners. Laid on the ground for a moment before he was able to crawl to the fridge and get some lemonade and bread to eat and call 911. Report isolated right ankle injury, deformity. No other injuries.        Home Medications Prior to Admission medications   Medication Sig Start Date End Date Taking? Authorizing Provider  acetaminophen (TYLENOL) 500 MG tablet Take 1,000 mg by mouth every 6 (six) hours as needed for mild pain or headache. Pain    [provider]  atorvastatin (LIPITOR) 40 MG tablet TAKE ONE TABLET BY MOUTH ONCE DAILY. 10/07/22   Laurey Morale, MD  carvedilol (COREG) 12.5 MG tablet Take 1 tablet (12.5 mg total) by mouth 2 (two) times daily with a meal. Dose change-updated dispill Patient taking differently: Take by mouth 2 (two) times daily with a meal. Patient takes 1 tablet in the morning and 0.5 tablet at night. 03/03/22   Dettinger, Elige Radon, MD  Cholecalciferol 50 MCG (2000 UT) TABS Take 2 tablets by mouth daily.    [provider]  furosemide (LASIX) 40 MG tablet Take 1 tablet (40 mg total) by mouth daily in the afternoon. 12/17/21   Laurey Morale, MD  GARLIC PO Take 1 capsule by mouth daily.    [provider]  glucose blood test strip Use as instructed 04/10/13   Vassie Loll,  MD  insulin aspart (NOVOLOG FLEXPEN) 100 UNIT/ML FlexPen INJECT 20-30 UNITS INTO THE SKIN THREE TIMES DAILY WITH MEALS 11/03/22   Dettinger, Elige Radon, MD  insulin degludec (TRESIBA FLEXTOUCH) 100 UNIT/ML FlexTouch Pen Inject 15-25 Units into the skin at bedtime. 10/27/22   Dettinger, Elige Radon, MD  Insulin Pen Needle 32G X 4 MM MISC Use as directed 01/26/22   Dettinger, Elige Radon, MD  Lancets MISC 1 Lancet by Does not apply route 3 (three) times daily. 12/22/21   Dettinger, Elige Radon, MD  levocetirizine (XYZAL) 5 MG tablet Take 1 tablet (5 mg total) by mouth daily. 12/08/21   Dettinger, Elige Radon, MD  Multiple Vitamin (MULTIVITAMIN WITH MINERALS) TABS tablet Take 1 tablet by mouth daily. (0800)    [provider]  rivaroxaban (XARELTO) 20 MG TABS tablet TAKE ONE TABLET BY MOUTH EVERY EVENING WITH SUPPER 12/17/21   Laurey Morale, MD  ROCKLATAN 0.02-0.005 % SOLN Apply 1 drop to eye at bedtime. 10/12/22   [provider]  sacubitril-valsartan (ENTRESTO) 24-26 MG Take 1 tablet by mouth 2 (two) times daily. 12/05/22   [place], Anderson Malta, FNP  sodium zirconium cyclosilicate (LOKELMA) 10 g PACK packet Take 10 g by mouth daily. 12/17/21   Laurey Morale, MD  triamcinolone (NASACORT) 55 MCG/ACT AERO nasal inhaler INSTILL 1 SPRAY IN EACH NOSTRIL ONCE OR TWICE  DAILY AS NEEDED. 09/04/17   Ernestina Penna, MD  vitamin C (ASCORBIC ACID) 500 MG tablet Take 500 mg by mouth in the morning. (0800)    [provider]      Allergies    Oxycodone    Review of Systems   Review of Systems Negative except as per HPI Physical Exam Updated Vital Signs Temp 98.9 F (37.2 C) (Oral)  Physical Exam Vitals and nursing note reviewed.  Constitutional:      General: He is not in acute distress.    Appearance: He is well-developed. He is not diaphoretic.  HENT:     Head: Normocephalic and atraumatic.  Cardiovascular:     Pulses: Normal pulses.  Pulmonary:     Effort: Pulmonary effort is normal.   Abdominal:     Palpations: Abdomen is soft.     Tenderness: There is no abdominal tenderness.  Musculoskeletal:        General: Tenderness, deformity and signs of injury present.     Cervical back: Normal range of motion and neck supple. No tenderness.     Right knee: No bony tenderness. No tenderness.     Comments: + right ankle deformity, DP pulse weak (bilaterally), able to move toes, sensation intact.   Skin:    General: Skin is warm and dry.     Findings: No erythema or rash.  Neurological:     Mental Status: He is alert and oriented to person, place, and time.     Sensory: No sensory deficit.  Psychiatric:        Behavior: Behavior normal.     ED Results / Procedures / Treatments   Labs (all labs ordered are listed, but only abnormal results are displayed) Labs Reviewed - No data to display  EKG None  Radiology No results found.  Procedures Procedures  {Document cardiac monitor, telemetry assessment procedure when appropriate:1}  Medications Ordered in ED Medications - No data to display  ED Course/ Medical Decision Making/ A&P   {   Click here for ABCD2, HEART and other calculatorsREFRESH Note before signing :1}                              Medical Decision Making  This patient presents to the ED for concern of ***, this involves an extensive number of treatment options, and is a complaint that carries with it a high risk of complications and morbidity.  The differential diagnosis includes ***   Co morbidities that complicate the patient evaluation  ***   Additional history obtained:  Additional history obtained from *** External records from outside source obtained and reviewed including ***   Lab Tests:  I Ordered, and personally interpreted labs.  The pertinent results include:  ***   Imaging Studies ordered:  I ordered imaging studies including ***  I independently visualized and interpreted imaging which showed *** I agree with the  radiologist interpretation   Cardiac Monitoring: / EKG:  The patient was maintained on a cardiac monitor.  I personally viewed and interpreted the cardiac monitored which showed an underlying rhythm of: ***   Consultations Obtained:  I requested consultation with the ***,  and discussed lab and imaging findings as well as pertinent plan - they recommend: ***   Problem List / ED Course / Critical interventions / Medication management  *** I ordered medication including ***  for ***  Reevaluation of the patient after these  medicines showed that the patient {resolved/improved/worsened:23923::"improved"} I have reviewed the patients home medicines and have made adjustments as needed   Social Determinants of Health:  ***   Test / Admission - Considered:  ***   {Document critical care time when appropriate:1} {Document review of labs and clinical decision tools ie heart score, Chads2Vasc2 etc:1}  {Document your independent review of radiology images, and any outside records:1} {Document your discussion with family members, caretakers, and with consultants:1} {Document social determinants of health affecting pt's care:1} {Document your decision making why or why not admission, treatments were needed:1} Final Clinical Impression(s) / ED Diagnoses Final diagnoses:  None    Rx / DC Orders ED Discharge Orders     None

## 2022-12-07 NOTE — Progress Notes (Signed)
Orthopedic Tech Progress Note Patient Details:  Greg Robertson 1950/04/23 [id number] Assisted ED MD with splint. Ortho Devices Type of Ortho Device: Short leg splint, Stirrup splint Ortho Device/Splint Location: RLE Ortho Device/Splint Interventions: Ordered, Application, Adjustment   Post Interventions Patient Tolerated: Well  Al Decant 12/07/2022, 11:51 PM

## 2022-12-07 NOTE — ED Triage Notes (Signed)
PT was BIB [facility] after being at home, walking to the refrigerator and fell. His right foot twisted, there is deformity and bruising. PT had no LOC and did not hit his head the patient is on Zarelto.

## 2022-12-07 NOTE — ED Provider Notes (Signed)
[facility] EMERGENCY DEPARTMENT AT [facility] Provider Note   CSN: [id number] Arrival date & time: 12/07/22  2254     History  Chief Complaint  Patient presents with   Greg Robertson is a 72 y.o. male.  72 year old male present with EMS for fall with right ankle injury. 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 He was feeling better, got back up and felt his blood sugar drop again, went to get some more lemonade but felt suddenly weak and fell to the ground. Denies LOC, did not hit head, not on thinners. Laid on the ground for a moment before he was able to crawl to the fridge and get some lemonade and bread to eat and call 911. Report isolated right ankle injury, deformity. No other injuries.        Home Medications Prior to Admission medications   Medication Sig Start Date End Date Taking? Authorizing Provider  acetaminophen (TYLENOL) 500 MG tablet Take 1,000 mg by mouth every 6 (six) hours as needed for mild pain or headache. Pain   Yes [provider]  atorvastatin (LIPITOR) 40 MG tablet TAKE ONE TABLET BY MOUTH ONCE DAILY. 10/07/22  Yes Laurey Morale, MD  carvedilol (COREG) 12.5 MG tablet Take 1 tablet (12.5 mg total) by mouth 2 (two) times daily with a meal. Dose change-updated dispill Patient taking differently: Take by mouth 2 (two) times daily with a meal. Patient takes 1 tablet in the morning and 0.5 tablet at night. 03/03/22  Yes Dettinger, Elige Radon, MD  Cholecalciferol 50 MCG (2000 UT) TABS Take 2 tablets by mouth daily.   Yes [provider]  furosemide (LASIX) 40 MG tablet Take 1 tablet (40 mg total) by mouth daily in the afternoon. 12/17/21  Yes Laurey Morale, MD  GARLIC PO Take 1 capsule by mouth daily.   Yes [provider]  insulin aspart (NOVOLOG FLEXPEN) 100 UNIT/ML FlexPen INJECT 20-30 UNITS INTO THE SKIN THREE TIMES DAILY WITH MEALS 11/03/22   Yes Dettinger, Elige Radon, MD  insulin degludec (TRESIBA FLEXTOUCH) 100 UNIT/ML FlexTouch Pen Inject 15-25 Units into the skin at bedtime. Patient taking differently: Inject 20 Units into the skin daily. 10/27/22  Yes Dettinger, Elige Radon, MD  levocetirizine (XYZAL) 5 MG tablet Take 1 tablet (5 mg total) by mouth daily. 12/08/21  Yes Dettinger, Elige Radon, MD  Multiple Vitamin (MULTIVITAMIN WITH MINERALS) TABS tablet Take 1 tablet by mouth daily. (0800)   Yes [provider]  rivaroxaban (XARELTO) 20 MG TABS tablet TAKE ONE TABLET BY MOUTH EVERY EVENING WITH SUPPER 12/17/21  Yes Laurey Morale, MD  ROCKLATAN 0.02-0.005 % SOLN Apply 0.5 drops to eye at bedtime. 10/12/22  Yes [provider]  sacubitril-valsartan (ENTRESTO) 24-26 MG Take 1 tablet by mouth 2 (two) times daily. 12/05/22  Yes Milford, Anderson Malta, FNP  sodium zirconium cyclosilicate (LOKELMA) 10 g PACK packet Take 10 g by mouth daily. Patient taking differently: Take 10 g by mouth at bedtime. 12/17/21  Yes Laurey Morale, MD  triamcinolone (NASACORT) 55 MCG/ACT AERO nasal inhaler INSTILL 1 SPRAY IN EACH NOSTRIL ONCE OR TWICE DAILY AS NEEDED. 09/04/17  Yes Ernestina Penna, MD  vitamin C (ASCORBIC ACID) 500 MG tablet Take 500 mg by mouth in the morning. (0800)   Yes [provider]  glucose blood test strip Use as instructed 04/10/13  Vassie Loll, MD  Insulin Pen Needle 32G X 4 MM MISC Use as directed 01/26/22   Dettinger, Elige Radon, MD  Lancets MISC 1 Lancet by Does not apply route 3 (three) times daily. 12/22/21   Dettinger, Elige Radon, MD      Allergies    Oxycodone    Review of Systems   Review of Systems Negative except as per HPI Physical Exam Updated Vital Signs BP (!) 156/69 (BP Location: Right Arm)   Pulse 84   Temp 98.3 F (36.8 C) (Oral)   Resp 17   Ht 6\' 1"  (1.854 m)   Wt 85.5 kg   SpO2 100%   BMI 24.87 kg/m  Physical Exam Vitals and nursing note reviewed.  Constitutional:      General: He is  not in acute distress.    Appearance: He is well-developed. He is not diaphoretic.  HENT:     Head: Normocephalic and atraumatic.  Cardiovascular:     Pulses: Normal pulses.  Pulmonary:     Effort: Pulmonary effort is normal.  Abdominal:     Palpations: Abdomen is soft.     Tenderness: There is no abdominal tenderness.  Musculoskeletal:        General: Tenderness, deformity and signs of injury present.     Cervical back: Normal range of motion and neck supple. No tenderness.     Right knee: No bony tenderness. No tenderness.     Comments: + right ankle deformity, DP pulse weak (bilaterally), able to move toes, sensation intact.   Skin:    General: Skin is warm and dry.     Findings: No erythema or rash.  Neurological:     Mental Status: He is alert and oriented to person, place, and time.     Sensory: No sensory deficit.  Psychiatric:        Behavior: Behavior normal.     ED Results / Procedures / Treatments   Labs (all labs ordered are listed, but only abnormal results are displayed) Labs Reviewed  CBC WITH DIFFERENTIAL/PLATELET - Abnormal; Notable for the following components:      Result Value   WBC 12.7 (*)    Neutro Abs 10.1 (*)    All other components within normal limits  BASIC METABOLIC PANEL - Abnormal; Notable for the following components:   Glucose, Bld 112 (*)    Creatinine, Ser 1.41 (*)    GFR, Estimated 53 (*)    All other components within normal limits  GLUCOSE, CAPILLARY - Abnormal; Notable for the following components:   Glucose-Capillary 119 (*)    All other components within normal limits  CBG MONITORING, ED - Abnormal; Notable for the following components:   Glucose-Capillary 117 (*)    All other components within normal limits  COMPREHENSIVE METABOLIC PANEL  CBC  PROTIME-INR  HEMOGLOBIN A1C    EKG None  Radiology DG Chest Port 1 View  Result Date: 12/08/2022 CLINICAL DATA:  Preop right ankle.  Shortness of breath. EXAM: PORTABLE CHEST 1  VIEW COMPARISON:  09/29/2020 FINDINGS: Heart and mediastinal contours are within normal limits. No focal opacities or effusions. No acute bony abnormality. Aortic atherosclerosis. IMPRESSION: No active cardiopulmonary disease. Electronically Signed   By: Charlett Nose M.D.   On: 12/08/2022 01:20   DG Ankle 2 Views Right  Result Date: 12/08/2022 CLINICAL DATA:  Postreduction EXAM: RIGHT ANKLE - 2 VIEW COMPARISON:  12/07/2022 FINDINGS: Interval reduction of the previously seen fracture dislocation. Continued lateral subluxation of the  talus relative to the tibia. Mildly displaced medial malleolar and distal fibular fractures. No definite posterior malleolar fracture seen on this repeat study IMPRESSION: Interval reduction with continued lateral subluxation and displacement of the by malleolar fracture. No definite posterior malleolar fracture seen currently. Electronically Signed   By: Charlett Nose M.D.   On: 12/08/2022 00:53   DG Ankle Right Port  Result Date: 12/08/2022 CLINICAL DATA:  Fall, deformity and bruising EXAM: PORTABLE RIGHT ANKLE - 2 VIEW COMPARISON:  None Available. FINDINGS: There is a fracture dislocation of the right ankle. Fracture involving the medial and posterior malleoli as well as distal fibula. Lateral dislocation of the talus relative to the tibia. Vascular calcifications. IMPRESSION: Trimalleolar fracture dislocation. Electronically Signed   By: Charlett Nose M.D.   On: 12/08/2022 00:07    Procedures Reduction of dislocation  Date/Time: 12/08/2022 12:42 AM  Performed by: Jeannie Fend, PA-C Authorized by: Jeannie Fend, PA-C  Consent: Verbal consent obtained. Risks and benefits: risks, benefits and alternatives were discussed Consent given by: patient Patient understanding: patient states understanding of the procedure being performed Patient consent: the patient's understanding of the procedure matches consent given Relevant documents: relevant documents present and  verified Imaging studies: imaging studies available Required items: required blood products, implants, devices, and special equipment available Patient identity confirmed: verbally with patient and arm band Time out: Immediately prior to procedure a "time out" was called to verify the correct patient, procedure, equipment, support staff and site/side marked as required. Local anesthesia used: no  Anesthesia: Local anesthesia used: no  Sedation: Patient sedated: yes (sedation as documented by Dr. Blinda Leatherwood)  Patient tolerance: patient tolerated the procedure well with no immediate complications       Medications Ordered in ED Medications  ceFAZolin (ANCEF) IVPB 2g/100 mL premix (has no administration in time range)  Tdap (BOOSTRIX) injection 0.5 mL (has no administration in time range)  atorvastatin (LIPITOR) tablet 40 mg (has no administration in time range)  carvedilol (COREG) tablet 12.5 mg (has no administration in time range)  carvedilol (COREG) tablet 6.25 mg (has no administration in time range)  furosemide (LASIX) tablet 40 mg (has no administration in time range)  sacubitril-valsartan (ENTRESTO) 24-26 mg per tablet (has no administration in time range)  sodium zirconium cyclosilicate (LOKELMA) packet 10 g (has no administration in time range)  cholecalciferol (VITAMIN D3) 25 MCG (1000 UNIT) tablet 4,000 Units (has no administration in time range)  multivitamin with minerals tablet 1 tablet (has no administration in time range)  ascorbic acid (VITAMIN C) tablet 500 mg (has no administration in time range)  cetirizine (ZYRTEC) tablet 10 mg (has no administration in time range)  sodium chloride flush (NS) 0.9 % injection 3 mL (has no administration in time range)  0.9 %  sodium chloride infusion ( Intravenous New Bag/Given 12/08/22 0234)  acetaminophen (TYLENOL) tablet 650 mg (has no administration in time range)    Or  acetaminophen (TYLENOL) suppository 650 mg (has no  administration in time range)  HYDROcodone-acetaminophen (NORCO/VICODIN) 5-325 MG per tablet 1-2 tablet (has no administration in time range)  morphine (PF) 2 MG/ML injection 1 mg (has no administration in time range)  traZODone (DESYREL) tablet 25 mg (has no administration in time range)  senna-docusate (Senokot-S) tablet 1 tablet (has no administration in time range)  bisacodyl (DULCOLAX) EC tablet 5 mg (has no administration in time range)  ondansetron (ZOFRAN) tablet 4 mg (has no administration in time range)    Or  ondansetron (ZOFRAN) injection 4 mg (has no administration in time range)  albuterol (PROVENTIL) (2.5 MG/3ML) 0.083% nebulizer solution 2.5 mg (has no administration in time range)  ipratropium (ATROVENT) nebulizer solution 0.5 mg (has no administration in time range)  hydrALAZINE (APRESOLINE) injection 5 mg (has no administration in time range)  insulin aspart (novoLOG) injection 0-9 Units (has no administration in time range)  insulin aspart (novoLOG) injection 0-5 Units (has no administration in time range)  propofol (DIPRIVAN) 10 mg/mL bolus/IV push 85.5 mg (7.5 mg Intravenous Given 12/07/22 2344)    ED Course/ Medical Decision Making/ A&P                                 Medical Decision Making Amount and/or Complexity of Data Reviewed Labs: ordered. Radiology: ordered.  Risk Prescription drug management. Decision regarding hospitalization.   This patient presents to the ED for concern of right ankle injury, this involves an extensive number of treatment options, and is a complaint that carries with it a high risk of complications and morbidity.  The differential diagnosis includes but not limited to    Co morbidities that complicate the patient evaluation  Type 1 diabetes, a-fib (previously prescribed Eliquis, states he is not taking this), CHF (echo 07/11/22 EF 55-60%), COPD (per record- on CXR, pt denies), HLD    Additional history obtained:  Additional  history obtained from EMS who contributes to history as above External records from outside source obtained and reviewed including recent visit to cardiology dated 12/05/22 Echo dated 07/11/22 EF 55-60%   Lab Tests:  I Ordered, and personally interpreted labs.  The pertinent results include:  CBG 117 on arrival. CBC with elevated WBC at 12.7, likely secondary to stress/injury. BMP with Cr 1.41, on trend with prior.    Imaging Studies ordered:  I ordered imaging studies including XR ankle, post reduction  I independently visualized and interpreted imaging which showed trimalleolar fracture/dislocation right ankle. Post reduction with improvement I agree with the radiologist interpretation   Consultations Obtained:  I requested consultation with the Dr. Christell Constant with orthopedics,  and discussed lab and imaging findings as well as pertinent plan - they recommend: keep NPO, plan for surgery in the morning Case discussed with Dr. Emmit Pomfret with [facility] hospitalist service will consult for admission.  Request EKG and chest x-ray.   Problem List / ED Course / Critical interventions / Medication management  72 year old male brought in by EMS for fall resulting in right ankle deformity.  Patient states that he is a brittle diabetic, felt weak tonight, found his blood sugar to be 47 so he drank lemonade.  Shortly after this he began to feel weak again, he was going to go to the kitchen to get more lemonade and eat some bread but he again felt weak and this time fell to the ground.  He did not hit his head, he denies loss of consciousness.  Eventually, he was able to crawl to the fridge, eat and drink and call 911 for help.  He arrives in the ER with positive deformity of the right ankle, sensation motor intact with DP pulse present.  He is not in any degree of pain requiring pain medication.  IV access was obtained and patient was sedated with fracture dislocation reduced and splinted.  Case was discussed  with orthopedics who request hospitalist to admit with plan for surgery in the morning.  Case discussed with hospitalist  will consult for admission. On arrival, I asked patient about his medical history and medications, specifically if he takes any blood thinners. He denied. On chart review, found to have history of a-fib, on Eliquis. Asked patient specifically about this, he states he stopped the Eliquis because it gave him blisters in his nose, is NOW taking Xarelto. Patient denies LOC, did not hit head.  I have reviewed the patients home medicines and have made adjustments as needed   Social Determinants of Health:  Lives alone   Test / Admission - Considered:  admit         Final Clinical Impression(s) / ED Diagnoses Final diagnoses:  Closed fracture dislocation of right ankle, initial encounter  Type 1 diabetes mellitus with hypoglycemia and without coma [facility])    Rx / DC Orders ED Discharge Orders     None         Jeannie Fend, PA-C 12/08/22 0236    Gilda Crease, MD 12/08/22 0630

## 2022-12-08 ENCOUNTER — Emergency Department (HOSPITAL_COMMUNITY): Payer: Medicare Other

## 2022-12-08 ENCOUNTER — Encounter (HOSPITAL_COMMUNITY): Payer: Self-pay | Admitting: Family Medicine

## 2022-12-08 ENCOUNTER — Encounter (HOSPITAL_COMMUNITY)
Admission: EM | Disposition: A | Discharge status: Skilled Nursing Facility | Payer: Self-pay | Source: Home / Self Care | Attending: Internal Medicine

## 2022-12-08 ENCOUNTER — Other Ambulatory Visit (HOSPITAL_COMMUNITY): Payer: Self-pay

## 2022-12-08 ENCOUNTER — Other Ambulatory Visit: Payer: Self-pay

## 2022-12-08 ENCOUNTER — Inpatient Hospital Stay (HOSPITAL_COMMUNITY): Payer: Medicare Other

## 2022-12-08 DIAGNOSIS — I4891 Unspecified atrial fibrillation: Secondary | ICD-10-CM | POA: Diagnosis not present

## 2022-12-08 DIAGNOSIS — Z9221 Personal history of antineoplastic chemotherapy: Secondary | ICD-10-CM | POA: Diagnosis not present

## 2022-12-08 DIAGNOSIS — E785 Hyperlipidemia, unspecified: Secondary | ICD-10-CM | POA: Diagnosis present

## 2022-12-08 DIAGNOSIS — Z8616 Personal history of COVID-19: Secondary | ICD-10-CM | POA: Diagnosis not present

## 2022-12-08 DIAGNOSIS — S8291XA Unspecified fracture of right lower leg, initial encounter for closed fracture: Secondary | ICD-10-CM | POA: Diagnosis not present

## 2022-12-08 DIAGNOSIS — Z01818 Encounter for other preprocedural examination: Secondary | ICD-10-CM | POA: Diagnosis not present

## 2022-12-08 DIAGNOSIS — Y92000 Kitchen of unspecified non-institutional (private) residence as  the place of occurrence of the external cause: Secondary | ICD-10-CM | POA: Diagnosis not present

## 2022-12-08 DIAGNOSIS — S82851A Displaced trimalleolar fracture of right lower leg, initial encounter for closed fracture: Secondary | ICD-10-CM | POA: Diagnosis present

## 2022-12-08 DIAGNOSIS — I5022 Chronic systolic (congestive) heart failure: Secondary | ICD-10-CM | POA: Diagnosis present

## 2022-12-08 DIAGNOSIS — I509 Heart failure, unspecified: Secondary | ICD-10-CM | POA: Diagnosis not present

## 2022-12-08 DIAGNOSIS — I10 Essential (primary) hypertension: Secondary | ICD-10-CM

## 2022-12-08 DIAGNOSIS — W1839XA Other fall on same level, initial encounter: Secondary | ICD-10-CM | POA: Diagnosis present

## 2022-12-08 DIAGNOSIS — Z803 Family history of malignant neoplasm of breast: Secondary | ICD-10-CM | POA: Diagnosis not present

## 2022-12-08 DIAGNOSIS — I2510 Atherosclerotic heart disease of native coronary artery without angina pectoris: Secondary | ICD-10-CM | POA: Diagnosis present

## 2022-12-08 DIAGNOSIS — Z85038 Personal history of other malignant neoplasm of large intestine: Secondary | ICD-10-CM | POA: Diagnosis not present

## 2022-12-08 DIAGNOSIS — Z8042 Family history of malignant neoplasm of prostate: Secondary | ICD-10-CM | POA: Diagnosis not present

## 2022-12-08 DIAGNOSIS — I69898 Other sequelae of other cerebrovascular disease: Secondary | ICD-10-CM | POA: Diagnosis not present

## 2022-12-08 DIAGNOSIS — E1022 Type 1 diabetes mellitus with diabetic chronic kidney disease: Secondary | ICD-10-CM | POA: Diagnosis present

## 2022-12-08 DIAGNOSIS — E109 Type 1 diabetes mellitus without complications: Secondary | ICD-10-CM | POA: Diagnosis not present

## 2022-12-08 DIAGNOSIS — I428 Other cardiomyopathies: Secondary | ICD-10-CM | POA: Diagnosis present

## 2022-12-08 DIAGNOSIS — E1065 Type 1 diabetes mellitus with hyperglycemia: Secondary | ICD-10-CM | POA: Diagnosis present

## 2022-12-08 DIAGNOSIS — I480 Paroxysmal atrial fibrillation: Secondary | ICD-10-CM | POA: Diagnosis present

## 2022-12-08 DIAGNOSIS — Z87891 Personal history of nicotine dependence: Secondary | ICD-10-CM | POA: Diagnosis not present

## 2022-12-08 DIAGNOSIS — E10649 Type 1 diabetes mellitus with hypoglycemia without coma: Secondary | ICD-10-CM

## 2022-12-08 DIAGNOSIS — M25571 Pain in right ankle and joints of right foot: Secondary | ICD-10-CM | POA: Diagnosis present

## 2022-12-08 DIAGNOSIS — Z7901 Long term (current) use of anticoagulants: Secondary | ICD-10-CM | POA: Diagnosis not present

## 2022-12-08 DIAGNOSIS — Z801 Family history of malignant neoplasm of trachea, bronchus and lung: Secondary | ICD-10-CM | POA: Diagnosis not present

## 2022-12-08 DIAGNOSIS — Z833 Family history of diabetes mellitus: Secondary | ICD-10-CM | POA: Diagnosis not present

## 2022-12-08 DIAGNOSIS — S82891A Other fracture of right lower leg, initial encounter for closed fracture: Secondary | ICD-10-CM | POA: Diagnosis not present

## 2022-12-08 DIAGNOSIS — Z79899 Other long term (current) drug therapy: Secondary | ICD-10-CM | POA: Diagnosis not present

## 2022-12-08 DIAGNOSIS — G4733 Obstructive sleep apnea (adult) (pediatric): Secondary | ICD-10-CM | POA: Diagnosis present

## 2022-12-08 DIAGNOSIS — E875 Hyperkalemia: Secondary | ICD-10-CM | POA: Diagnosis present

## 2022-12-08 DIAGNOSIS — I110 Hypertensive heart disease with heart failure: Secondary | ICD-10-CM | POA: Diagnosis not present

## 2022-12-08 DIAGNOSIS — I130 Hypertensive heart and chronic kidney disease with heart failure and stage 1 through stage 4 chronic kidney disease, or unspecified chronic kidney disease: Secondary | ICD-10-CM | POA: Diagnosis present

## 2022-12-08 DIAGNOSIS — N182 Chronic kidney disease, stage 2 (mild): Secondary | ICD-10-CM | POA: Diagnosis present

## 2022-12-08 HISTORY — PX: ORIF ANKLE FRACTURE: SHX5408

## 2022-12-08 LAB — ECHOCARDIOGRAM COMPLETE
AR max vel: 3.37 cm2
AV Peak grad: 5.2 mmHg
Ao pk vel: 1.14 m/s
Area-P 1/2: 3.03 cm2
Height: 73 in
S' Lateral: 2.7 cm
Weight: 3015.89 oz

## 2022-12-08 LAB — BASIC METABOLIC PANEL
Anion gap: 6 (ref 5–15)
BUN: 20 mg/dL (ref 8–23)
CO2: 26 mmol/L (ref 22–32)
Calcium: 8.9 mg/dL (ref 8.9–10.3)
Chloride: 108 mmol/L (ref 98–111)
Creatinine, Ser: 1.41 mg/dL — ABNORMAL HIGH (ref 0.61–1.24)
GFR, Estimated: 53 mL/min — ABNORMAL LOW (ref >60)
Glucose, Bld: 112 mg/dL — ABNORMAL HIGH (ref 70–99)
Potassium: 4 mmol/L (ref 3.5–5.1)
Sodium: 140 mmol/L (ref 135–145)

## 2022-12-08 LAB — COMPREHENSIVE METABOLIC PANEL
ALT: 14 U/L (ref 0–44)
AST: 19 U/L (ref 15–41)
Albumin: 3.1 g/dL — ABNORMAL LOW (ref 3.5–5.0)
Alkaline Phosphatase: 73 U/L (ref 38–126)
Anion gap: 8 (ref 5–15)
BUN: 15 mg/dL (ref 8–23)
CO2: 22 mmol/L (ref 22–32)
Calcium: 8.2 mg/dL — ABNORMAL LOW (ref 8.9–10.3)
Chloride: 104 mmol/L (ref 98–111)
Creatinine, Ser: 1.37 mg/dL — ABNORMAL HIGH (ref 0.61–1.24)
GFR, Estimated: 55 mL/min — ABNORMAL LOW (ref >60)
Glucose, Bld: 343 mg/dL — ABNORMAL HIGH (ref 70–99)
Potassium: 3.8 mmol/L (ref 3.5–5.1)
Sodium: 134 mmol/L — ABNORMAL LOW (ref 135–145)
Total Bilirubin: 0.8 mg/dL (ref 0.3–1.2)
Total Protein: 4.9 g/dL — ABNORMAL LOW (ref 6.5–8.1)

## 2022-12-08 LAB — CBC
HCT: 35.9 % — ABNORMAL LOW (ref 39.0–52.0)
Hemoglobin: 12.4 g/dL — ABNORMAL LOW (ref 13.0–17.0)
MCH: 30.5 pg (ref 26.0–34.0)
MCHC: 34.5 g/dL (ref 30.0–36.0)
MCV: 88.2 fL (ref 80.0–100.0)
Platelets: 136 10*3/uL — ABNORMAL LOW (ref 150–400)
RBC: 4.07 MIL/uL — ABNORMAL LOW (ref 4.22–5.81)
RDW: 12.6 % (ref 11.5–15.5)
WBC: 8.1 10*3/uL (ref 4.0–10.5)
nRBC: 0 % (ref 0.0–0.2)

## 2022-12-08 LAB — CBC WITH DIFFERENTIAL/PLATELET
Abs Immature Granulocytes: 0.05 10*3/uL (ref 0.00–0.07)
Basophils Absolute: 0 10*3/uL (ref 0.0–0.1)
Basophils Relative: 0 %
Eosinophils Absolute: 0.1 10*3/uL (ref 0.0–0.5)
Eosinophils Relative: 1 %
HCT: 40.6 % (ref 39.0–52.0)
Hemoglobin: 13.9 g/dL (ref 13.0–17.0)
Immature Granulocytes: 0 %
Lymphocytes Relative: 13 %
Lymphs Abs: 1.6 10*3/uL (ref 0.7–4.0)
MCH: 30.4 pg (ref 26.0–34.0)
MCHC: 34.2 g/dL (ref 30.0–36.0)
MCV: 88.8 fL (ref 80.0–100.0)
Monocytes Absolute: 0.8 10*3/uL (ref 0.1–1.0)
Monocytes Relative: 6 %
Neutro Abs: 10.1 10*3/uL — ABNORMAL HIGH (ref 1.7–7.7)
Neutrophils Relative %: 80 %
Platelets: 161 10*3/uL (ref 150–400)
RBC: 4.57 MIL/uL (ref 4.22–5.81)
RDW: 12.6 % (ref 11.5–15.5)
WBC: 12.7 10*3/uL — ABNORMAL HIGH (ref 4.0–10.5)
nRBC: 0 % (ref 0.0–0.2)

## 2022-12-08 LAB — GLUCOSE, CAPILLARY
Glucose-Capillary: 106 mg/dL — ABNORMAL HIGH (ref 70–99)
Glucose-Capillary: 119 mg/dL — ABNORMAL HIGH (ref 70–99)
Glucose-Capillary: 87 mg/dL (ref 70–99)
Glucose-Capillary: 335 mg/dL — ABNORMAL HIGH (ref 70–99)
Glucose-Capillary: 190 mg/dL — ABNORMAL HIGH (ref 70–99)
Glucose-Capillary: 162 mg/dL — ABNORMAL HIGH (ref 70–99)

## 2022-12-08 LAB — PROTIME-INR
INR: 1.2 (ref 0.8–1.2)
Prothrombin Time: 15.7 seconds — ABNORMAL HIGH (ref 11.4–15.2)

## 2022-12-08 LAB — HEMOGLOBIN A1C
Hgb A1c MFr Bld: 9.2 % — ABNORMAL HIGH (ref 4.8–5.6)
Mean Plasma Glucose: 217.34 mg/dL

## 2022-12-08 LAB — SURGICAL PCR SCREEN
MRSA, PCR: NEGATIVE
Staphylococcus aureus: NEGATIVE

## 2022-12-08 SURGERY — OPEN REDUCTION INTERNAL FIXATION (ORIF) ANKLE FRACTURE
Anesthesia: General | Site: Ankle | Laterality: Right

## 2022-12-08 MED ORDER — TRAZODONE HCL 50 MG PO TABS: 25.0000 mg | ORAL_TABLET | Freq: Every evening | ORAL | Status: DC | PRN

## 2022-12-08 MED ORDER — TETANUS-DIPHTH-ACELL PERTUSSIS 5-2.5-18.5 LF-MCG/0.5 IM SUSY: 0.5000 mL | PREFILLED_SYRINGE | Freq: Once | INTRAMUSCULAR | Status: DC

## 2022-12-08 MED ORDER — SODIUM CHLORIDE 0.9 % IV SOLN: INTRAVENOUS | Status: DC

## 2022-12-08 MED ORDER — INSULIN ASPART 100 UNIT/ML IJ SOLN
0.0000 [IU] | Freq: Every day | INTRAMUSCULAR | Status: DC
  Administered 2022-12-11: 4 [IU] via SUBCUTANEOUS

## 2022-12-08 MED ORDER — LACTATED RINGERS IV SOLN: INTRAVENOUS | Status: DC

## 2022-12-08 MED ORDER — CARVEDILOL 12.5 MG PO TABS
12.5000 mg | ORAL_TABLET | Freq: Every day | ORAL | Status: DC
  Administered 2022-12-08 – 2022-12-13 (×6): 12.5 mg via ORAL
  Filled 2022-12-08 (×6): qty 1

## 2022-12-08 MED ORDER — PROPOFOL 10 MG/ML IV BOLUS
INTRAVENOUS | Status: AC
  Filled 2022-12-08: qty 20

## 2022-12-08 MED ORDER — SENNOSIDES-DOCUSATE SODIUM 8.6-50 MG PO TABS: 1.0000 | ORAL_TABLET | Freq: Every evening | ORAL | Status: DC | PRN

## 2022-12-08 MED ORDER — CEFAZOLIN SODIUM-DEXTROSE 2-4 GM/100ML-% IV SOLN
2.0000 g | Freq: Once | INTRAVENOUS | Status: AC
  Administered 2022-12-08: 2 g via INTRAVENOUS
  Filled 2022-12-08: qty 100

## 2022-12-08 MED ORDER — VITAMIN C 500 MG PO TABS
500.0000 mg | ORAL_TABLET | Freq: Every day | ORAL | Status: DC
  Administered 2022-12-08 – 2022-12-13 (×6): 500 mg via ORAL
  Filled 2022-12-08 (×6): qty 1

## 2022-12-08 MED ORDER — SODIUM ZIRCONIUM CYCLOSILICATE 10 G PO PACK: 10.0000 g | PACK | Freq: Every day | ORAL | Status: DC

## 2022-12-08 MED ORDER — HYDROCODONE-ACETAMINOPHEN 5-325 MG PO TABS
1.0000 | ORAL_TABLET | ORAL | Status: DC | PRN
  Administered 2022-12-08: 2 via ORAL
  Administered 2022-12-08: 1 via ORAL
  Filled 2022-12-08: qty 2
  Filled 2022-12-08: qty 1

## 2022-12-08 MED ORDER — ADULT MULTIVITAMIN W/MINERALS CH
1.0000 | ORAL_TABLET | Freq: Every day | ORAL | Status: DC
  Administered 2022-12-08 – 2022-12-13 (×6): 1 via ORAL
  Filled 2022-12-08 (×6): qty 1

## 2022-12-08 MED ORDER — TRANEXAMIC ACID-NACL 1000-0.7 MG/100ML-% IV SOLN
1000.0000 mg | INTRAVENOUS | Status: AC
  Administered 2022-12-09: 1000 mg via INTRAVENOUS

## 2022-12-08 MED ORDER — ACETAMINOPHEN 650 MG RE SUPP: 650.0000 mg | Freq: Four times a day (QID) | RECTAL | Status: DC | PRN

## 2022-12-08 MED ORDER — BISACODYL 5 MG PO TBEC: 5.0000 mg | DELAYED_RELEASE_TABLET | Freq: Every day | ORAL | Status: DC | PRN

## 2022-12-08 MED ORDER — ACETAMINOPHEN 325 MG PO TABS
650.0000 mg | ORAL_TABLET | Freq: Four times a day (QID) | ORAL | Status: DC | PRN
  Administered 2022-12-08: 650 mg via ORAL
  Filled 2022-12-08: qty 2

## 2022-12-08 MED ORDER — INSULIN ASPART 100 UNIT/ML IJ SOLN
0.0000 [IU] | Freq: Three times a day (TID) | INTRAMUSCULAR | Status: DC
  Administered 2022-12-08: 7 [IU] via SUBCUTANEOUS
  Administered 2022-12-08 – 2022-12-09 (×2): 2 [IU] via SUBCUTANEOUS
  Administered 2022-12-09: 5 [IU] via SUBCUTANEOUS
  Administered 2022-12-09: 9 [IU] via SUBCUTANEOUS
  Administered 2022-12-10: 5 [IU] via SUBCUTANEOUS
  Administered 2022-12-10 (×2): 9 [IU] via SUBCUTANEOUS
  Administered 2022-12-11: 5 [IU] via SUBCUTANEOUS
  Administered 2022-12-11: 9 [IU] via SUBCUTANEOUS
  Administered 2022-12-11: 7 [IU] via SUBCUTANEOUS

## 2022-12-08 MED ORDER — CEFAZOLIN SODIUM-DEXTROSE 2-4 GM/100ML-% IV SOLN
2.0000 g | INTRAVENOUS | Status: AC
  Administered 2022-12-09: 2 g via INTRAVENOUS

## 2022-12-08 MED ORDER — ALBUTEROL SULFATE (2.5 MG/3ML) 0.083% IN NEBU: 2.5000 mg | INHALATION_SOLUTION | Freq: Four times a day (QID) | RESPIRATORY_TRACT | Status: DC | PRN

## 2022-12-08 MED ORDER — CARVEDILOL 6.25 MG PO TABS
6.2500 mg | ORAL_TABLET | Freq: Every day | ORAL | Status: DC
  Administered 2022-12-08 – 2022-12-12 (×5): 6.25 mg via ORAL
  Filled 2022-12-08 (×5): qty 1

## 2022-12-08 MED ORDER — ATORVASTATIN CALCIUM 40 MG PO TABS
40.0000 mg | ORAL_TABLET | Freq: Every day | ORAL | Status: DC
  Administered 2022-12-08 – 2022-12-13 (×6): 40 mg via ORAL
  Filled 2022-12-08 (×6): qty 1

## 2022-12-08 MED ORDER — FUROSEMIDE 40 MG PO TABS
40.0000 mg | ORAL_TABLET | Freq: Every day | ORAL | Status: DC
  Administered 2022-12-08 – 2022-12-12 (×5): 40 mg via ORAL
  Filled 2022-12-08 (×5): qty 1

## 2022-12-08 MED ORDER — VITAMIN D 25 MCG (1000 UNIT) PO TABS
4000.0000 [IU] | ORAL_TABLET | Freq: Every day | ORAL | Status: DC
  Administered 2022-12-08 – 2022-12-13 (×6): 4000 [IU] via ORAL
  Filled 2022-12-08 (×6): qty 4

## 2022-12-08 MED ORDER — MORPHINE SULFATE (PF) 2 MG/ML IV SOLN
1.0000 mg | Freq: Four times a day (QID) | INTRAVENOUS | Status: DC | PRN
  Administered 2022-12-08: 1 mg via INTRAVENOUS
  Filled 2022-12-08: qty 1

## 2022-12-08 MED ORDER — ONDANSETRON HCL 4 MG PO TABS: 4.0000 mg | ORAL_TABLET | Freq: Four times a day (QID) | ORAL | Status: DC | PRN

## 2022-12-08 MED ORDER — IPRATROPIUM BROMIDE 0.02 % IN SOLN: 0.5000 mg | Freq: Four times a day (QID) | RESPIRATORY_TRACT | Status: DC | PRN

## 2022-12-08 MED ORDER — ONDANSETRON HCL 4 MG/2ML IJ SOLN: 4.0000 mg | Freq: Four times a day (QID) | INTRAMUSCULAR | Status: DC | PRN

## 2022-12-08 MED ORDER — SODIUM ZIRCONIUM CYCLOSILICATE 10 G PO PACK
10.0000 g | PACK | Freq: Every day | ORAL | Status: DC
  Administered 2022-12-09 – 2022-12-11 (×3): 10 g via ORAL
  Filled 2022-12-08 (×4): qty 1

## 2022-12-08 MED ORDER — SACUBITRIL-VALSARTAN 24-26 MG PO TABS
1.0000 | ORAL_TABLET | Freq: Two times a day (BID) | ORAL | Status: DC
  Administered 2022-12-08 – 2022-12-13 (×11): 1 via ORAL
  Filled 2022-12-08 (×11): qty 1

## 2022-12-08 MED ORDER — HYDRALAZINE HCL 20 MG/ML IJ SOLN: 5.0000 mg | Freq: Three times a day (TID) | INTRAMUSCULAR | Status: DC | PRN

## 2022-12-08 MED ORDER — SODIUM CHLORIDE 0.9% FLUSH
3.0000 mL | Freq: Two times a day (BID) | INTRAVENOUS | Status: DC
  Administered 2022-12-08 – 2022-12-11 (×6): 3 mL via INTRAVENOUS

## 2022-12-08 MED ORDER — CETIRIZINE HCL 10 MG PO TABS
10.0000 mg | ORAL_TABLET | Freq: Every day | ORAL | Status: DC
  Administered 2022-12-08 – 2022-12-13 (×6): 10 mg via ORAL
  Filled 2022-12-08 (×6): qty 1

## 2022-12-08 SURGICAL SUPPLY — 76 items
BAG COUNTER SPONGE SURGICOUNT (BAG) ×1 IMPLANT
BAG SPNG CNTER NS LX DISP (BAG) ×1
BIT DRILL 2.7 QC CANN 155 (BIT) IMPLANT
BIT DRILL 2.7MM OVERBIT QC (BIT) IMPLANT
BIT DRILL QC 2.0 SHORT EVOS SM (DRILL) IMPLANT
BIT DRILL QC 2.5MM SHRT EVO SM (DRILL) IMPLANT
BLADE SURG 15 STRL LF DISP TIS (BLADE) ×1 IMPLANT
BLADE SURG 15 STRL SS (BLADE) ×1
BNDG CMPR 5X4 KNIT ELC UNQ LF (GAUZE/BANDAGES/DRESSINGS) ×2
BNDG COHESIVE 4X5 TAN STRL (GAUZE/BANDAGES/DRESSINGS) ×1 IMPLANT
BNDG ELASTIC 4INX 5YD STR LF (GAUZE/BANDAGES/DRESSINGS) IMPLANT
BNDG ELASTIC 4X5.8 VLCR STR LF (GAUZE/BANDAGES/DRESSINGS) IMPLANT
BNDG ELASTIC 6X5.8 VLCR STR LF (GAUZE/BANDAGES/DRESSINGS) IMPLANT
CANISTER SUCT 3000ML PPV (MISCELLANEOUS) ×1 IMPLANT
COVER MAYO STAND STRL (DRAPES) IMPLANT
COVER SURGICAL LIGHT HANDLE (MISCELLANEOUS) ×1 IMPLANT
CUFF TOURN SGL QUICK 34 (TOURNIQUET CUFF)
CUFF TOURN SGL QUICK 42 (TOURNIQUET CUFF) IMPLANT
CUFF TRNQT CYL 34X4.125X (TOURNIQUET CUFF) IMPLANT
DRAPE C-ARM 42X72 X-RAY (DRAPES) ×1 IMPLANT
DRAPE C-ARMOR (DRAPES) ×1 IMPLANT
DRAPE INCISE IOBAN 66X45 STRL (DRAPES) IMPLANT
DRAPE U-SHAPE 47X51 STRL (DRAPES) ×1 IMPLANT
DRILL 2.7MM OVERDRILL QC (BIT) ×1
DRILL QC 2.0 SHORT EVOS SM (DRILL) ×1
DRILL QC 2.5MM SHORT EVOS SM (DRILL) ×1
DURAPREP 26ML APPLICATOR (WOUND CARE) ×1 IMPLANT
ELECT CAUTERY BLADE 6.4 (BLADE) ×1 IMPLANT
ELECT REM PT RETURN 9FT ADLT (ELECTROSURGICAL) ×1
ELECTRODE REM PT RTRN 9FT ADLT (ELECTROSURGICAL) ×1 IMPLANT
GAUZE SPONGE 4X4 12PLY STRL (GAUZE/BANDAGES/DRESSINGS) ×1 IMPLANT
GAUZE XEROFORM 5X9 LF (GAUZE/BANDAGES/DRESSINGS) ×1 IMPLANT
GLOVE BIOGEL PI IND STRL 7.0 (GLOVE) ×2 IMPLANT
GLOVE BIOGEL PI IND STRL 7.5 (GLOVE) ×3 IMPLANT
GLOVE ECLIPSE 7.0 STRL STRAW (GLOVE) ×1 IMPLANT
GLOVE SKINSENSE STRL SZ7.5 (GLOVE) ×1 IMPLANT
GLOVE SURG SYN 7.5 E (GLOVE) ×2 IMPLANT
GLOVE SURG SYN 7.5 PF PI (GLOVE) ×2 IMPLANT
GLOVE SURG UNDER POLY LF SZ7 (GLOVE) ×19 IMPLANT
GLOVE SURG UNDER POLY LF SZ7.5 (GLOVE) ×2 IMPLANT
GOWN STRL SURGICAL XL XLNG (GOWN DISPOSABLE) ×1 IMPLANT
GUIDE PIN 1.3 (PIN) ×2
KIT BASIN OR (CUSTOM PROCEDURE TRAY) ×1 IMPLANT
KIT INVISIKNOT ANKLE FRACTURE (Screw) IMPLANT
KIT TURNOVER KIT B (KITS) ×1 IMPLANT
NDL HYPO 25GX1X1/2 BEV (NEEDLE) IMPLANT
NEEDLE HYPO 25GX1X1/2 BEV (NEEDLE) IMPLANT
NS IRRIG 1000ML POUR BTL (IV SOLUTION) ×1 IMPLANT
PACK ORTHO EXTREMITY (CUSTOM PROCEDURE TRAY) ×1 IMPLANT
PAD ARMBOARD 7.5X6 YLW CONV (MISCELLANEOUS) ×2 IMPLANT
PADDING CAST COTTON 6X4 STRL (CAST SUPPLIES) ×1 IMPLANT
PIN GUIDE 1.3 (PIN) IMPLANT
PLATE FIB EVOS 2.7/3.5 7H R103 (Plate) IMPLANT
SCREW CANN 6XFT 40X4X2.7X (Screw) IMPLANT
SCREW CANNULATED 4.0X40 (Screw) ×1 IMPLANT
SCREW CANNULATED 4.1X40 (Screw) IMPLANT
SCREW CORT 2.7X12 EVOS (Screw) IMPLANT
SCREW CORT 2.7X14 T8 EVOS (Screw) IMPLANT
SCREW CORT 2.7X16 STAR T8 EVOS (Screw) IMPLANT
SCREW CORT 3.5X14 ST EVOS (Screw) IMPLANT
SCREW CORT 3.5X15 ST EVOS (Screw) IMPLANT
SCREW CORT 3.5X17 ST EVOS (Screw) IMPLANT
SCREW CORT ST EVOS 2.7X14 (Screw) IMPLANT
SCREW CORTEX 2.7X15 (Screw) IMPLANT
SCREW LOCK 2.7X13 ST EVOS (Screw) IMPLANT
SPLINT PLASTER CAST FAST 5X30 (CAST SUPPLIES) IMPLANT
SUCTION TUBE FRAZIER 10FR DISP (SUCTIONS) ×1 IMPLANT
SUT ETHILON 3 0 PS 1 (SUTURE) IMPLANT
SUT VIC AB 2-0 CT1 27 (SUTURE)
SUT VIC AB 2-0 CT1 TAPERPNT 27 (SUTURE) IMPLANT
SUT VIC AB 2-0 SH 18 (SUTURE) IMPLANT
SYR CONTROL 10ML LL (SYRINGE) IMPLANT
TOWEL GREEN STERILE (TOWEL DISPOSABLE) ×2 IMPLANT
TOWEL GREEN STERILE FF (TOWEL DISPOSABLE) ×1 IMPLANT
TUBE CONNECTING 12X1/4 (SUCTIONS) ×1 IMPLANT
UNDERPAD 30X36 HEAVY ABSORB (UNDERPADS AND DIAPERS) ×1 IMPLANT

## 2022-12-08 NOTE — ED Notes (Signed)
ED TO INPATIENT HANDOFF REPORT  ED Nurse Name and Phone #: Rodney Booze [phone]  S Name/Age/Gender Darvin Neighbours Tiso 72 y.o. male Room/Bed: 025C/025C  Code Status   Code Status: Full Code  Home/SNF/Other Home Patient oriented to: self, place, time, and situation Is this baseline? Yes   Triage Complete: Triage complete  Chief Complaint Closed right ankle fracture [S82.891A]  Triage Note PT was BIB [facility] after being at home, walking to the refrigerator and fell. His right foot twisted, there is deformity and bruising. PT had no LOC and did not hit his head the patient is on Zarelto.   Allergies Allergies  Allergen Reactions   Oxycodone Nausea And Vomiting    Level of Care/Admitting Diagnosis ED Disposition     ED Disposition  Admit   Condition  --   Comment  Hospital Area: [facility] [[id number]]  Level of Care: Med-Surg [16]  May admit patient to [facility] or [facility] if equivalent level of care is available:: No  Covid Evaluation: Asymptomatic - no recent exposure (last 10 days) testing not required  Diagnosis: Closed right ankle fracture [098119]  Admitting Physician: Tonye Royalty [[id number]]  Attending Physician: Janann Colonel  Certification:: I certify this patient will need inpatient services for at least 2 midnights  Expected Medical Readiness: 12/10/2022          B Medical/Surgery History Past Medical History:  Diagnosis Date   Allergic rhinitis    Anemia    Atrial fibrillation (HCC)    Persistent   Cancer of ascending colon, 7cm 05/25/2011   Congestive heart failure (CHF) (HCC)    Diabetes mellitus type I (HCC)    Glaucoma    Hypertension    Pneumonia 1979 or 1980   Prostate nodule    Substance abuse (HCC)    Past Surgical History:  Procedure Laterality Date   broken left shoulder blade and collar bone     COLONOSCOPY WITH PROPOFOL N/A 04/29/2021   Procedure: COLONOSCOPY WITH PROPOFOL;  Surgeon: Rachael Fee, MD;  Location: [facility] ENDOSCOPY;  Service: Endoscopy;  Laterality: N/A;   ESOPHAGOGASTRODUODENOSCOPY (EGD) WITH PROPOFOL N/A 04/29/2021   Procedure: ESOPHAGOGASTRODUODENOSCOPY (EGD) WITH PROPOFOL;  Surgeon: Rachael Fee, MD;  Location: [facility] ENDOSCOPY;  Service: Endoscopy;  Laterality: N/A;   FINGER SURGERY  2009   right middle   HEMOSTASIS CLIP PLACEMENT  04/29/2021   Procedure: HEMOSTASIS CLIP PLACEMENT;  Surgeon: Rachael Fee, MD;  Location: [facility] ENDOSCOPY;  Service: Endoscopy;;   LAPAROSCOPIC ASSISTED 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     NASAL FRACTURE SURGERY  1968   POLYPECTOMY  04/29/2021   Procedure: POLYPECTOMY;  Surgeon: Rachael Fee, MD;  Location: [facility] ENDOSCOPY;  Service: Endoscopy;;   PORTACATH PLACEMENT  07/01/2011   Procedure: INSERTION PORT-A-CATH;  Surgeon: Ardeth Sportsman, MD;  Location: [facility] ORS;  Service: General;  Laterality: Left;  Insertion of Port-A-Catheter Left Internal Jugular   RIGHT/LEFT HEART CATH AND CORONARY ANGIOGRAPHY N/A 12/08/2020   Procedure: RIGHT/LEFT HEART CATH AND CORONARY ANGIOGRAPHY;  Surgeon: Laurey Morale, MD;  Location: [facility] INVASIVE CV LAB;  Service: Cardiovascular;  Laterality: N/A;   TONSILLECTOMY  1957 - approximate     A IV Location/Drains/Wounds Patient Lines/Drains/Airways Status     Active Line/Drains/Airways     Name Placement date Placement time Site Days   Incision - 4 Ports Abdomen Umbilicus Left;Lower Right;Lower Left;Upper 06/02/11  1548  -- 4207  Intake/Output Last 24 hours No intake or output data in the 24 hours ending 12/08/22 0054  Labs/Imaging Results for orders placed or performed during the hospital encounter of 12/07/22 (from the past 48 hour(s))  POC CBG, ED     Status: Abnormal   Collection Time: 12/07/22 11:35 PM  Result Value Ref Range   Glucose-Capillary 117 (H) 70 - 99 mg/dL    Comment: Glucose reference range applies only to samples taken after fasting for at least 8 hours.   CBC with Differential     Status: Abnormal   Collection Time: 12/07/22 11:43 PM  Result Value Ref Range   WBC 12.7 (H) 4.0 - 10.5 K/uL   RBC 4.57 4.22 - 5.81 MIL/uL   Hemoglobin 13.9 13.0 - 17.0 g/dL   HCT 16.1 09.6 - 04.5 %   MCV 88.8 80.0 - 100.0 fL   MCH 30.4 26.0 - 34.0 pg   MCHC 34.2 30.0 - 36.0 g/dL   RDW 40.9 81.1 - 91.4 %   Platelets 161 150 - 400 K/uL   nRBC 0.0 0.0 - 0.2 %   Neutrophils Relative % 80 %   Neutro Abs 10.1 (H) 1.7 - 7.7 K/uL   Lymphocytes Relative 13 %   Lymphs Abs 1.6 0.7 - 4.0 K/uL   Monocytes Relative 6 %   Monocytes Absolute 0.8 0.1 - 1.0 K/uL   Eosinophils Relative 1 %   Eosinophils Absolute 0.1 0.0 - 0.5 K/uL   Basophils Relative 0 %   Basophils Absolute 0.0 0.0 - 0.1 K/uL   Immature Granulocytes 0 %   Abs Immature Granulocytes 0.05 0.00 - 0.07 K/uL    Comment: Performed at [facility], [address], [place], [place] [address]  Basic metabolic panel     Status: Abnormal   Collection Time: 12/07/22 11:43 PM  Result Value Ref Range   Sodium 140 135 - 145 mmol/L   Potassium 4.0 3.5 - 5.1 mmol/L   Chloride 108 98 - 111 mmol/L   CO2 26 22 - 32 mmol/L   Glucose, Bld 112 (H) 70 - 99 mg/dL    Comment: Glucose reference range applies only to samples taken after fasting for at least 8 hours.   BUN 20 8 - 23 mg/dL   Creatinine, Ser 6.21 (H) 0.61 - 1.24 mg/dL   Calcium 8.9 8.9 - 30.8 mg/dL   GFR, Estimated 53 (L) >60 mL/min    Comment: (NOTE) Calculated using the CKD-EPI Creatinine Equation (2021)    Anion gap 6 5 - 15    Comment: Performed at [facility], [address], [place], [place] [address]   DG Ankle Right Port  Result Date: 12/08/2022 CLINICAL DATA:  Fall, deformity and bruising EXAM: PORTABLE RIGHT ANKLE - 2 VIEW COMPARISON:  None Available. FINDINGS: There is a fracture dislocation of the right ankle. Fracture involving the medial and posterior malleoli as well as distal fibula. Lateral dislocation of the talus relative to  the tibia. Vascular calcifications. IMPRESSION: Trimalleolar fracture dislocation. Electronically Signed   By: Charlett Nose M.D.   On: 12/08/2022 00:07    Pending Labs [employer] (From admission, onward)     Start     Ordered   Signed and Held  Comprehensive metabolic panel  Tomorrow morning,   R        Signed and Held   Signed and Held  CBC  Tomorrow morning,   R        Signed  and Held   Signed and Held  Protime-INR  Tomorrow morning,   R        Signed and Held   Signed and Held  Hemoglobin A1c  (Glycemic Control (SSI)  Q 4 Hours / Glycemic Control (SSI)  AC +/- HS)  Once,   R       Comments: To assess prior glycemic control    Signed and Held            Vitals/Pain Today's Vitals   12/07/22 2254 12/07/22 2259 12/07/22 2345 12/08/22 0045  BP:   (!) 145/71 (!) 153/89  Pulse:   85 81  Resp:   16 19  Temp: 98.9 F (37.2 C)  (!) 97.2 F (36.2 C)   TempSrc: Oral  Oral   SpO2:   100% 98%  PainSc:  4       Isolation Precautions No active isolations  Medications Medications  ceFAZolin (ANCEF) IVPB 2g/100 mL premix (has no administration in time range)  Tdap (BOOSTRIX) injection 0.5 mL (has no administration in time range)  propofol (DIPRIVAN) 10 mg/mL bolus/IV push 85.5 mg (7.5 mg Intravenous Given 12/07/22 2344)    Mobility walks     Focused Assessments Cardiac Assessment Handoff:  Cardiac Rhythm: Normal sinus rhythm Lab Results  Component Value Date   CKTOTAL 668 (H) 03/12/2012   CKMB 4.1 (H) 03/12/2012   TROPONINI <0.30 03/12/2012   No results found for: "DDIMER" Does the Patient currently have chest pain? No    R Recommendations: See Admitting Provider Note  Report given to:   Additional Notes:

## 2022-12-08 NOTE — Plan of Care (Signed)
  Problem: Nutrition: Goal: Adequate nutrition will be maintained Outcome: Progressing   

## 2022-12-08 NOTE — Consult Note (Signed)
Orthopedic Surgery Consult Note  Assessment: Patient is a 72 y.o. male with right ankle fracture dislocation   Plan: -Planning for operative fixation tonight -Diet: diabetic until 9am, then NPO for procedure (orders changed) -DVT ppx: hold for now, home xarelto will be his dvt ppx post-op -Antibiotics: ancef on call to OR -TXA on call to OR too -Weight bearing status: NWB RLE -PT evaluate and treat post-op -Pain control -Dispo: pending completion of operative plans   Discussed recommendation for operative intervention in the form of right ankle fracture ORIF. Explained the risks of this procedure included, but were not limited to: nonunion, malunion, hardware failure, infection, bleeding, stiffness, post-traumatic arthritis, need for additional procedures, deep vein thrombosis, pulmonary embolism, and death. Explained that he would be at higher risk for complication, particularly hardware failure and infection, given his poorly controlled diabetes. The benefits of this procedure would be to promote fracture healing by providing stability and to heal the fracture in the appropriate alignment. The alternatives of this surgery would be to treat the fracture with immobilization in a splint/cast or to do no intervention. The patient's questions were answered to his satisfaction. After this discussion, patient elected to proceed with surgery. Informed consent was obtained.   ___________________________________________________________________________   Chief complaint: right ankle pain  History:  Patient is a 72 y.o. male who was at home last night when he got lightheaded due to hypoglycemia and fell to the ground. He noted acute onset of right ankle pain. He was brought to [facility] ED where he was found to have a closed, right ankle fracture/dislocation. The ankle was reduced and a splint was applied by the ER. He was admitted to a medicine service. Pain is well controlled this morning. Only  pain is in the right ankle, no other extremity pain.   Past medical history:  Allergic rhinitis Atrial fibrillation Ascending colon cancer DM (last A1C was 9.9 on 10/27/2022) CHF HTN  Allergies: oxycodone   Past surgical history:  Right long finger surgery Ileocolectomy Polypectomy Nasal fracture surgery Tonsillectomy   Social history: Former use of nicotine-containing products (cigarettes, vaping, smokeless, etc.) Alcohol use: yes, has about 1 drink every day Denies use of recreational drugs  Family history: -reviewed and not pertinent to ankle fracture   Physical Exam:  BMI of 24.9  General: no acute distress, appears stated age Neurologic: alert, answering questions appropriately, following commands Cardiovascular: regular rate, no cyanosis Respiratory: unlabored breathing on room air, symmetric chest rise Psychiatric: appropriate affect, normal cadence to speech  MSK:   -Bilateral upper extremities  No tenderness to palpation over extremity, no gross deformity, no open wounds Fires deltoid, biceps, triceps, wrist extensors, wrist flexors, finger extensors, finger flexors  AIN/PIN/IO intact  Palpable radial pulse  Sensation intact to light touch in median/ulnar/radial/axillary nerve distributions  Hand warm and well perfused  -Left lower extremity  No tenderness to palpation over extremity, no gross deformity, no open wounds Fires hip flexors, quadriceps, hamstrings, tibialis anterior, gastrocnemius and soleus, extensor hallucis longus Plantarflexes and dorsiflexes toes Sensation intact to light touch in sural, saphenous, tibial, deep peroneal, and superficial peroneal nerve distributions Foot warm and well perfused  -Right lower extremity  No tenderness to palpation over extremity proximal to the splint, to tenderness to palpation over the toes, no gross deformity, no open wounds seen Fires hip flexors, quadriceps, hamstrings, extensor hallucis longus Does  not fire TA or GSC due to pain Plantarflexes and dorsiflexes toes Sensation intact to light touch in sural,  saphenous, tibial, deep peroneal, and superficial peroneal nerve distributions Foot warm and well perfused  Imaging: XR of the right ankle from 12/07/2022 was independently reviewed and interpreted, showing bimalleolar ankle fracture with lateral dislocation. No other fractures seen.    Patient name: Greg Robertson Patient MRN: [id number] Date: 12/08/22

## 2022-12-08 NOTE — H&P (Signed)
History and Physical   [facility] [place] @ [facility] Admission History and Physical [employer], D.O.    Patient Name: Greg Robertson MR#: [id number] Date of Birth: Dec 05, 1950 Date of Admission: 12/07/2022  Referring MD/NP/PA: Dr. Blinda Leatherwood Primary Care Physician: Dettinger, Elige Radon, MD  Chief Complaint:  Chief Complaint  Patient presents with   Fall    HPI: Greg Robertson is a 72 y.o. male with a known history of atrial fibrillation on Xarelto, CHF (EF 15 to 60% in March 2024), diabetes, hypertension, CVA, colon cancer presents to the emergency department for evaluation of right foot and ankle pain.  Patient was in a usual state of health until last night when he was hypoglycemic with a blood sugar of 47, walked to the kitchen to get a drink, initially felt better but then felt weak and lightheaded again.  As he got up a second time he fell to the ground on his right leg.  Patient reports right ankle and leg pain.  Ankle was reduced in the emergency department and splinted.   He is functionally independent with a walker. Denies any exertional chest pain or SOB.   Patient denies fevers/chills, weakness, dizziness, chest pain, shortness of breath, N/V/C/D, abdominal pain, dysuria/frequency, changes in mental status.    Otherwise there has been no change in status. Patient has been taking medication as prescribed and there has been no recent change in medication or diet.  No recent antibiotics.  There has been no recent illness, hospitalizations, travel or sick contacts.    EMS/ED Course: Patient received Ancef. Medical admission has been requested for further management of multiple medical problems in addition to his right ankle fracture.  Review of Systems:  CONSTITUTIONAL: No fever/chills, fatigue, weakness, weight gain/loss, headache. EYES: No blurry or double vision. ENT: No tinnitus, postnasal drip, redness or soreness of the oropharynx. RESPIRATORY: No cough,  dyspnea, wheeze.  No hemoptysis.  CARDIOVASCULAR: No chest pain, palpitations, syncope, orthopnea. No lower extremity edema.  GASTROINTESTINAL: No nausea, vomiting, abdominal pain, diarrhea, constipation.  No hematemesis, melena or hematochezia. GENITOURINARY: No dysuria, frequency, hematuria. ENDOCRINE: No polyuria or nocturia. No heat or cold intolerance. HEMATOLOGY: No anemia, bruising, bleeding. INTEGUMENTARY: No rashes, ulcers, lesions. MUSCULOSKELETAL: Positive right ankle and foot pain no arthritis, gout. NEUROLOGIC: No numbness, tingling, ataxia, seizure-type activity, weakness. PSYCHIATRIC: No anxiety, depression, insomnia.   Past Medical History:  Diagnosis Date   Allergic rhinitis    Anemia    Atrial fibrillation (HCC)    Persistent   Cancer of ascending colon, 7cm 05/25/2011   Congestive heart failure (CHF) (HCC)    Diabetes mellitus type I (HCC)    Glaucoma    Hypertension    Pneumonia 1979 or 1980   Prostate nodule    Substance abuse (HCC)     Past Surgical History:  Procedure Laterality Date   broken left shoulder blade and collar bone     COLONOSCOPY WITH PROPOFOL N/A 04/29/2021   Procedure: COLONOSCOPY WITH PROPOFOL;  Surgeon: Rachael Fee, MD;  Location: [facility];  Service: Endoscopy;  Laterality: N/A;   ESOPHAGOGASTRODUODENOSCOPY (EGD) WITH PROPOFOL N/A 04/29/2021   Procedure: ESOPHAGOGASTRODUODENOSCOPY (EGD) WITH PROPOFOL;  Surgeon: Rachael Fee, MD;  Location: [facility];  Service: Endoscopy;  Laterality: N/A;   FINGER SURGERY  2009   right middle   HEMOSTASIS CLIP PLACEMENT  04/29/2021   Procedure: HEMOSTASIS CLIP PLACEMENT;  Surgeon: Rachael Fee, MD;  Location: [facility];  Service: Endoscopy;;   LAPAROSCOPIC ASSISTED  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     NASAL FRACTURE SURGERY  1968   POLYPECTOMY  04/29/2021   Procedure: POLYPECTOMY;  Surgeon: Rachael Fee, MD;  Location: [facility];  Service: Endoscopy;;   PORTACATH  PLACEMENT  07/01/2011   Procedure: INSERTION PORT-A-CATH;  Surgeon: Ardeth Sportsman, MD;  Location: [facility];  Service: General;  Laterality: Left;  Insertion of Port-A-Catheter Left Internal Jugular   RIGHT/LEFT HEART CATH AND CORONARY ANGIOGRAPHY N/A 12/08/2020   Procedure: RIGHT/LEFT HEART CATH AND CORONARY ANGIOGRAPHY;  Surgeon: Laurey Morale, MD;  Location: [facility] INVASIVE CV LAB;  Service: Cardiovascular;  Laterality: N/A;   TONSILLECTOMY  1957 - approximate     reports that he quit smoking about 29 years ago. His smoking use included cigarettes. He started smoking about 44 years ago. He has a 15 pack-year smoking history. He has never used smokeless tobacco. He reports that he does not currently use alcohol after a past usage of about 5.0 standard drinks of alcohol per week. He reports that he does not currently use drugs after having used the following drugs: Marijuana.  Allergies  Allergen Reactions   Oxycodone Nausea And Vomiting    Family History  Problem Relation Age of Onset   Breast cancer Mother    Pancreatitis Mother        intestinal adhesions   Insomnia Mother    Colon polyps Father    Lung cancer Father    Diabetes Father    Prostate cancer Father    Colon cancer Neg Hx    Rectal cancer Neg Hx    Esophageal cancer Neg Hx    Inflammatory bowel disease Neg Hx    Liver disease Neg Hx    Pancreatic cancer Neg Hx    Stomach cancer Neg Hx     Prior to Admission medications   Medication Sig Start Date End Date Taking? Authorizing Provider  acetaminophen (TYLENOL) 500 MG tablet Take 1,000 mg by mouth every 6 (six) hours as needed for mild pain or headache. Pain   Yes [provider]  atorvastatin (LIPITOR) 40 MG tablet TAKE ONE TABLET BY MOUTH ONCE DAILY. 10/07/22  Yes Laurey Morale, MD  carvedilol (COREG) 12.5 MG tablet Take 1 tablet (12.5 mg total) by mouth 2 (two) times daily with a meal. Dose change-updated dispill Patient taking differently: Take by mouth 2  (two) times daily with a meal. Patient takes 1 tablet in the morning and 0.5 tablet at night. 03/03/22  Yes Dettinger, Elige Radon, MD  Cholecalciferol 50 MCG (2000 UT) TABS Take 2 tablets by mouth daily.   Yes [provider]  furosemide (LASIX) 40 MG tablet Take 1 tablet (40 mg total) by mouth daily in the afternoon. 12/17/21  Yes Laurey Morale, MD  GARLIC PO Take 1 capsule by mouth daily.   Yes [provider]  insulin aspart (NOVOLOG FLEXPEN) 100 UNIT/ML FlexPen INJECT 20-30 UNITS INTO THE SKIN THREE TIMES DAILY WITH MEALS 11/03/22  Yes Dettinger, Elige Radon, MD  insulin degludec (TRESIBA FLEXTOUCH) 100 UNIT/ML FlexTouch Pen Inject 15-25 Units into the skin at bedtime. Patient taking differently: Inject 20 Units into the skin daily. 10/27/22  Yes Dettinger, Elige Radon, MD  levocetirizine (XYZAL) 5 MG tablet Take 1 tablet (5 mg total) by mouth daily. 12/08/21  Yes Dettinger, Elige Radon, MD  Multiple Vitamin (MULTIVITAMIN WITH MINERALS) TABS tablet Take 1 tablet by mouth daily. (0800)   Yes [provider]  rivaroxaban (XARELTO) 20  MG TABS tablet TAKE ONE TABLET BY MOUTH EVERY EVENING WITH SUPPER 12/17/21  Yes Laurey Morale, MD  ROCKLATAN 0.02-0.005 % SOLN Apply 0.5 drops to eye at bedtime. 10/12/22  Yes [provider]  sacubitril-valsartan (ENTRESTO) 24-26 MG Take 1 tablet by mouth 2 (two) times daily. 12/05/22  Yes Milford, Anderson Malta, FNP  sodium zirconium cyclosilicate (LOKELMA) 10 g PACK packet Take 10 g by mouth daily. Patient taking differently: Take 10 g by mouth at bedtime. 12/17/21  Yes Laurey Morale, MD  triamcinolone (NASACORT) 55 MCG/ACT AERO nasal inhaler INSTILL 1 SPRAY IN EACH NOSTRIL ONCE OR TWICE DAILY AS NEEDED. 09/04/17  Yes Ernestina Penna, MD  vitamin C (ASCORBIC ACID) 500 MG tablet Take 500 mg by mouth in the morning. (0800)   Yes [provider]  glucose blood test strip Use as instructed 04/10/13   Vassie Loll, MD  Insulin Pen Needle 32G  X 4 MM MISC Use as directed 01/26/22   Dettinger, Elige Radon, MD  Lancets MISC 1 Lancet by Does not apply route 3 (three) times daily. 12/22/21   Dettinger, Elige Radon, MD    Physical Exam: Vitals:   12/07/22 2254 12/07/22 2345  BP:  (!) 145/71  Pulse:  85  Resp:  16  Temp: 98.9 F (37.2 C) (!) 97.2 F (36.2 C)  TempSrc: Oral Oral  SpO2:  100%    GENERAL: 72 y.o.-year-old male patient, well-developed, well-nourished lying in the bed in no acute distress.  Pleasant and cooperative.   HEENT: Head atraumatic, normocephalic. Pupils equal. Mucus membranes moist. NECK: Supple. No JVD. CHEST: Normal breath sounds bilaterally. No wheezing, rales, rhonchi or crackles. No use of accessory muscles of respiration.  No reproducible chest wall tenderness.  CARDIOVASCULAR: S1, S2 normal. No murmurs, rubs, or gallops. Cap refill <2 seconds. Pulses intact distally.  ABDOMEN: Soft, nondistended, nontender. No rebound, guarding, rigidity. Normoactive bowel sounds present in all four quadrants.  EXTREMITIES: Right ankle is splinted NEUROLOGIC: The patient is alert and oriented x 3. Cranial nerves II through XII are grossly intact with no focal sensorimotor deficit. PSYCHIATRIC:  Normal affect, mood, thought content. SKIN: Warm, dry, and intact without obvious rash, lesion, or ulcer.    Labs on Admission:  CBC: Recent Labs  Lab 12/07/22 2343  WBC 12.7*  NEUTROABS 10.1*  HGB 13.9  HCT 40.6  MCV 88.8  PLT 161   Basic Metabolic Panel: Recent Labs  Lab 12/05/22 1355 12/07/22 2343  NA 135 140  K 4.1 4.0  CL 99 108  CO2 27 26  GLUCOSE 368* 112*  BUN 17 20  CREATININE 1.21 1.41*  CALCIUM 8.5* 8.9   GFR: Estimated Creatinine Clearance: 54.3 mL/min (A) (by C-G formula based on SCr of 1.41 mg/dL (H)). Liver Function Tests: No results for input(s): "AST", "ALT", "ALKPHOS", "BILITOT", "PROT", "ALBUMIN" in the last 168 hours. No results for input(s): "LIPASE", "AMYLASE" in the last 168 hours. No  results for input(s): "AMMONIA" in the last 168 hours. Coagulation Profile: No results for input(s): "INR", "PROTIME" in the last 168 hours. Cardiac Enzymes: No results for input(s): "CKTOTAL", "CKMB", "CKMBINDEX", "TROPONINI" in the last 168 hours. BNP (last 3 results) No results for input(s): "PROBNP" in the last 8760 hours. HbA1C: No results for input(s): "HGBA1C" in the last 72 hours. CBG: Recent Labs  Lab 12/07/22 2335  GLUCAP 117*   Lipid Profile: No results for input(s): "CHOL", "HDL", "LDLCALC", "TRIG", "CHOLHDL", "LDLDIRECT" in the last 72 hours. Thyroid Function  Tests: No results for input(s): "TSH", "T4TOTAL", "FREET4", "T3FREE", "THYROIDAB" in the last 72 hours. Anemia Panel: No results for input(s): "VITAMINB12", "FOLATE", "FERRITIN", "TIBC", "IRON", "RETICCTPCT" in the last 72 hours. Urine analysis:    Component Value Date/Time   COLORURINE STRAW (A) 09/27/2020 1548   APPEARANCEUR CLEAR 09/27/2020 1548   APPEARANCEUR Clear 09/04/2017 1304   LABSPEC 1.031 (H) 09/27/2020 1548   PHURINE 5.0 09/27/2020 1548   GLUCOSEU >=500 (A) 09/27/2020 1548   HGBUR NEGATIVE 09/27/2020 1548   BILIRUBINUR NEGATIVE 09/27/2020 1548   BILIRUBINUR Negative 09/04/2017 1304   KETONESUR 20 (A) 09/27/2020 1548   PROTEINUR NEGATIVE 09/27/2020 1548   UROBILINOGEN negative 01/21/2015 1104   UROBILINOGEN 0.2 04/06/2013 2058   NITRITE NEGATIVE 09/27/2020 1548   LEUKOCYTESUR NEGATIVE 09/27/2020 1548   Sepsis Labs: @LABRCNTIP (procalcitonin:4,lacticidven:4) )No results found for this or any previous visit (from the past 240 hour(s)).   Radiological Exams on Admission: DG Ankle Right Port  Result Date: 12/08/2022 CLINICAL DATA:  Fall, deformity and bruising EXAM: PORTABLE RIGHT ANKLE - 2 VIEW COMPARISON:  None Available. FINDINGS: There is a fracture dislocation of the right ankle. Fracture involving the medial and posterior malleoli as well as distal fibula. Lateral dislocation of the talus  relative to the tibia. Vascular calcifications. IMPRESSION: Trimalleolar fracture dislocation. Electronically Signed   By: Charlett Nose M.D.   On: 12/08/2022 00:07    EKG: Pending  Assessment/Plan  This is a 72 y.o. male with a history of atrial fibrillation on Xarelto, CHF (EF 55 to 60% in March 2024), diabetes, hypertension, CVA, colon cancer now being admitted with:  #.  Trimalleolar fracture and dislocation of the right ankle -Admit inpatient - Management per Ortho - Pain control - Will need preoperative cardiovascular evaluation including EKG and echo and cardiology consult, follows with Dr. Shirlee Latch  #.  CKD, near baseline -Gentle IV fluids and repeat BMP in AM.   #.  History of atrial fibrillation - Continue Coreg - Hold Xarelto -Check INR  #.  History of CHF - Continue Lasix, Coreg, Entresto, Lipitor  #. History of diabetes -Accu-Cheks 4 hours with insulin sliding scale  Admission status: Patient IV Fluids: LR Diet/Nutrition: N.p.o. Consults called: Ortho DVT Px: SCDs and early ambulation. Code Status: Full Code  Disposition Plan: To be determined  All the records are reviewed and case discussed with ED provider. Management plans discussed with the patient and/or family who express understanding and agree with plan of care.  [employer] D.O. on 12/08/2022 at 12:26 AM CC: Primary care physician; Dettinger, Elige Radon, MD   12/08/2022, 12:26 AM

## 2022-12-08 NOTE — ED Provider Notes (Signed)
Presents for ankle injury which occurred when he became hypoglycemic and fell.  Patient with obvious deformity of the right ankle.  Physical Exam  BP (!) 145/71 (BP Location: Right Arm)   Pulse 85   Temp (!) 97.2 F (36.2 C) (Oral)   Resp 16   SpO2 100%   Physical Exam Vitals and nursing note reviewed.  Constitutional:      Appearance: Normal appearance.  HENT:     Head: Normocephalic.  Eyes:     Pupils: Pupils are equal, round, and reactive to light.  Cardiovascular:     Rate and Rhythm: Normal rate.  Pulmonary:     Effort: Pulmonary effort is normal.  Musculoskeletal:     Right ankle: Swelling, deformity and ecchymosis present. Tenderness present. Decreased range of motion.  Neurological:     Mental Status: He is alert.     Procedures  .Sedation  Date/Time: 12/08/2022 12:43 AM  Performed by: Gilda Crease, MD Authorized by: Gilda Crease, MD   Consent:    Consent obtained:  Verbal   Consent given by:  Patient   Risks discussed:  Allergic reaction, prolonged hypoxia resulting in organ damage, dysrhythmia, respiratory compromise necessitating ventilatory assistance and intubation, inadequate sedation, nausea and vomiting Universal protocol:    Procedure explained and questions answered to patient or proxy's satisfaction: yes     Test results available: yes     Imaging studies available: yes     Required blood products, implants, devices, and special equipment available: yes     Site/side marked: yes     Immediately prior to procedure, a time out was called: yes     Patient identity confirmed:  Verbally with patient Indications:    Procedure performed:  Fracture reduction   Procedure necessitating sedation performed by:  Physician performing sedation Pre-sedation assessment:    Time since last food or drink:  1   NPO status caution: urgency dictates proceeding with non-ideal NPO status     ASA classification: class 3 - patient with severe systemic  disease     Mouth opening:  3 or more finger widths   Thyromental distance:  3 finger widths   Mallampati score:  III - soft palate, base of uvula visible   Neck mobility: normal     Pre-sedation assessments completed and reviewed: airway patency, cardiovascular function, hydration status, mental status, nausea/vomiting, pain level, respiratory function and temperature   Immediate pre-procedure details:    Reassessment: Patient reassessed immediately prior to procedure     Reviewed: vital signs, relevant labs/tests and NPO status     Verified: bag valve mask available, emergency equipment available, intubation equipment available, IV patency confirmed, oxygen available, reversal medications available and suction available   Procedure details (see MAR for exact dosages):    Preoxygenation:  Nasal cannula   Sedation:  Propofol   Intended level of sedation: deep   Intra-procedure monitoring:  Blood pressure monitoring, continuous capnometry, frequent LOC assessments, cardiac monitor, continuous pulse oximetry and frequent vital sign checks   Intra-procedure events: none     Total Provider sedation time (minutes):  15 Post-procedure details:    Attendance: Constant attendance by certified staff until patient recovered     Recovery: Patient returned to pre-procedure baseline     Post-sedation assessments completed and reviewed: airway patency, cardiovascular function, hydration status, mental status, nausea/vomiting, pain level, respiratory function and temperature     Patient is stable for discharge or admission: yes     Procedure  completion:  Tolerated well, no immediate complications  Angiocath insertion Performed by: Gilda Crease  Consent: Verbal consent obtained. Risks and benefits: risks, benefits and alternatives were discussed Time out: Immediately prior to procedure a "time out" was called to verify the correct patient, procedure, equipment, support staff and site/side  marked as required.  Preparation: Patient was prepped and draped in the usual sterile fashion.  Vein Location: L antecub Ultrasound Guided Gauge: 20  Normal blood return and flush without difficulty Patient tolerance: Patient tolerated the procedure well with no immediate complications.  SPLINT APPLICATION Date/Time: 12:50 AM Authorized by: Gilda Crease Consent: Verbal consent obtained. Risks and benefits: risks, benefits and alternatives were discussed Consent given by: patient Splint applied by: orthopedic technician Location details: R leg Splint type: stirrup/posterior Supplies used: orthoglass Post-procedure: The splinted body part was neurovascularly unchanged following the procedure. Patient tolerance: Patient tolerated the procedure well with no immediate complications.    ED Course / MDM    Medical Decision Making Amount and/or Complexity of Data Reviewed Labs: ordered. Radiology: ordered.  Risk Prescription drug management. Decision regarding hospitalization.   Trimalleolar fracture dislocation of the right ankle.  Patient sedated, reduction performed by Army Melia, PA-C under my direct supervision.       Gilda Crease, MD 12/08/22 [phone]

## 2022-12-08 NOTE — Inpatient Diabetes Management (Signed)
Inpatient Diabetes Program Recommendations  AACE/ADA: New Consensus Statement on Inpatient Glycemic Control (2015)  Target Ranges:  Prepandial:   less than 140 mg/dL      Peak postprandial:   less than 180 mg/dL (1-2 hours)      Critically ill patients:  140 - 180 mg/dL   Lab Results  Component Value Date   GLUCAP 335 (H) 12/08/2022   HGBA1C 9.2 (H) 12/08/2022    Review of Glycemic Control  Latest Reference Range & Units 12/07/22 22:52 12/07/22 23:35 12/08/22 01:21 12/08/22 08:45 12/08/22 11:36  Glucose-Capillary 70 - 99 mg/dL 528 (H) 413 (H) 244 (H) 87 335 (H)   Diabetes history: DM 2 Outpatient Diabetes medications: Tresiba 20 units, Novolog 20-30 units tid (1 units for every 4 grams of carbs for breakfast and dinner, 1 unit for every 5 grams of carbs at lunchtime) Current orders for Inpatient glycemic control:  Novolog 0-9 units tid   A1c 9.2% on 8/22  Spoke with pt at bedside. He is very brittle with his glucose trends at home. He reports that he often has hypoglycemia before dinner. He is followed by [facility] and has a PharmD that manages his diabetes. Discussed current A1c of 9.2%. reviewed glucose ad A1c goals.  Glucose trends increased into the 300's at lunch time, pt only had carbs to eat. I encouraged protein intake in the future to prevent such a high spike in glucose. Pt is interested in the CGMs at discharge and had questions about that. I am running a benefits check today to inquire about insurance coverage. Pt has a flip phone and will need a reader prescribed at discharge.  Pt has surgery this evening  Inpatient Diabetes Program Recommendations:    -   Consider Semglee 10 units to give tonight after surgery -   Change Novolog Correction to 0-9 units Q4 hours to cover overnight hyperglycemia (decadron often used during surgery)  Thanks,  Christena Deem RN, MSN, BC-ADM Inpatient Diabetes Coordinator Team Pager [phone] (8a-5p)

## 2022-12-08 NOTE — Plan of Care (Signed)
?  Problem: Nutrition: Goal: Adequate nutrition will be maintained Outcome: Progressing   Problem: Activity: Goal: Risk for activity intolerance will decrease Outcome: Not Progressing   

## 2022-12-08 NOTE — Op Note (Addendum)
Orthopedic Surgery Operative Report   Procedure: Right trimalleolar ankle fracture open reduction internal fixation Open reduction and fixation of right syndesmosis   Modifier: none   Date of procedure: 12/09/2022   Patient name: Greg Robertson   MRN: [id number]  DOB: August 02, 1950   Surgeon: Willia Craze, MD Assistant: None Pre-operative diagnosis: right ankle fracture/dislocation Post-operative diagnosis: same as above Findings: right ankle fracture   Specimens: none Anesthesia: general EBL: 50cc Complications: none Pre-incision antibiotic: ancef TXA given prior to incision as well   Implants:  Implant Name Type Inv. Item Serial No. Manufacturer Lot No. LRB No. Used Action  SCREW CORTEX 2.7X15 - [id number] Screw SCREW CORTEX 2.7X15  [employer]  Right 1 Implanted and Explanted  SCREW CORT ST EVOS 2.7X14 - [id number] Screw SCREW CORT ST EVOS 2.7X14  [employer]  Right 1 Implanted  PLATE FIB EVOS 3.8/7.5 7H R103 - [id number] Plate PLATE FIB EVOS 4.1/6.6 7H R103  [employer]  Right 1 Implanted  SCREW CORT 3.5X14 ST EVOS - [id number] Screw SCREW CORT 3.5X14 ST EVOS  [employer]  Right 2 Implanted  SCREW CORT 3.5X15 ST EVOS - [id number] Screw SCREW CORT 3.5X15 ST EVOS  [employer]  Right 1 Implanted  SCREW CORT 3.5X17 ST EVOS - [id number] Screw SCREW CORT 3.5X17 ST EVOS  [employer]  Right 1 Implanted  SCREW LOCK 2.7X13 ST EVOS - [id number] Screw SCREW LOCK 2.7X13 ST EVOS  [employer]  Right 1 Implanted  SCREW CORT EVOS ST T8 2.7X14MM - [id number] Screw SCREW CORT EVOS ST T8 2.7X14MM  [employer]  Right 1 Implanted  SCREW CORT 2.7X14 T8 EVOS - [id number] Screw SCREW CORT 2.7X14 T8 EVOS  [employer]  Right 1 Implanted  SCREW CORT 2.7X16 STAR T8 EVOS - [id number] Screw SCREW CORT 2.7X16 STAR T8 EVOS  [employer]   Right 2 Implanted  SCREW CANNULATED 4.0X40 - [id number] Screw SCREW CANNULATED 4.0X40  [employer]  Right 1 Implanted  SCREW CANNULATED 4.1X40 - [id number] Screw SCREW CANNULATED 4.1X40  [employer]  Right 1 Implanted  KIT INVISIKNOT ANKLE FRACTURE - [id number] Screw KIT INVISIKNOT ANKLE FRACTURE  [employer] [id number] Right 1 Implanted       Indication for procedure: Patient is a 72 y.o. male who presented to the ER after a ground level fall. The patient had right ankle pain and x-rays revealed an ankle fracture/dislocation. The patient was admitted to a medicine service with orthopedics consulted. I met the patient and discussed the fracture. I recommended operative management in the form of open reduction internal fixation. Explained the risks of this procedure included, but were not limited to: nonunion, malunion, fixation failure or malposition, infection, bleeding, stiffness, post-traumatic arthritis, need for additional procedures, deep vein thrombosis, pulmonary embolism, MI, arrhythmia, and death. Given his elevated A1C, I told him that his risks of surgery would be higher particularly infection and hardware failure. The alternatives of this surgery would be to treat the fracture with immobilization or to perform no intervention. Told him my tentative plan is to keep him non weight bearing for a total of 3 months given his uncontrolled diabetes (A1C of 9.2). After our discussion, patient elected to proceed with surgery.    Procedure Description: The patient was met in the pre-operative holding area. The patient's identity and consent were verified. The operative site  was marked by myself. The patient's remaining questions about the surgery were answered. The patient was brought back to the operating room. General anesthesia was induced and an endotracheal tube was placed by the anesthesia staff. The patient was transferred to the OR table. All bony  prominences were well padded. The surgical area was cleansed with a CHX scrub brush and alcohol. Ancef and TXA were administered by anesthesia. The patient's skin was then prepped and draped in a standard, sterile fashion. A time out was performed that identified the patient, the procedure, and the operative site. All team members agreed with what was stated in the time out.    An incision was made over the fibula and was taken sharply down to the level of the bone. The fracture was cleared of hematoma and soft tissue. The fracture edges were well visualized and a reduction clamp was used to obtain a reduction. The reduction was checked under fluoroscopy on the AP and lateral view, confirming satisfactory reduction. 2 2.7 screws were placed oblique to the fracture using a lag by technique method. A distal fibula plate was applied to the lateral aspect of the fibula. Fluoroscopy was used to confirm that the plate was in the appropriate position. 3 cortical holes near the top of the plate were drilled. A depth gauge was used to measure the screw length and the appropriately sized screws was placed. A The most distal 3.65mm cortical screw hole in the plate was just distal to the fracture. This hole was drilled bicortically. A depth gauge was used to measure the screw length and that length 3.19mm cortical screw was placed. Next, attention was turned to the distal locking screw holes. The distal locking guide was placed into the screw hole. The screw hole was drilled unicortically. A depth gauge was to measure the length of the screw and that length screw was then inserted. This process was repeated for all the distal locking screws.   A longitudinal incision was made over the distal aspect of the medial malleolus. It was carried down sharply to the level of the bone. A knife was used to clear off the fracture edges. Suction and irrigation was used to clear out of the fracture or hematoma. A dental pick was then used  to obtain the reduction anteriorly. Posteriorly there was comminution so the pieces were aligned using a dental pick. 2 K wires were then placed into the medial malleolus fractures to hold the reduction and so a cannulated screw could be placed over them. A drill was used to drill over the K wire. A 40mm fully threaded screw was then placed over the more posterior wire until there was reasonable purchase. Then, a cannulated drill was used to drill over the anterior wire. A 40mm partially threaded screw was then inserted over this wire until there was good purchase. A stress test was then performed under fluoroscopy and there was medial clear space gapping seen so decision was made to proceed with syndesmotic fixation. The syndesmosis was visualized and reduced under direct visualization. A K wire from the fibula into the tibia was used to hold the reduction. The most proximal lag screw was then removed since it was in the way of the planned invisaknot. A beath pin was then drilled from one of the holes in the fibula across both cortices and then across both cortices of the tibia. The pin was seen coming out over the medial side. A separate incision was made over that beath pin  over the medial ankle. The pin was then advanced out the separate incision. An invisaknot was then passed across the syndesmosis using the beath pin. The medial button was flipped over the distal tibia so it was on the cortex. The invisaknot was tightened down and sutures were tied to hold it in place. Final fluoroscopic films including AP, mortise, lateral, and a stress view were obtained that showed satisfactory position of the fixation and a satisfactory reduction. On the stress view, no medial gapping was seen.     The wounds were copiously irrigated with sterile saline. The deep dermal layer was closed with 2-0 vicryl. The skin was closed with 2-0 nylon. Dressings were applied. A well padded short leg splint was applied. All counts  were correct at the end of the case. Patient was transferred back to a hospital bed. The patient was awakened from anesthesia and brought back to the post-anesthesia care unit in stable condition.     Post-operative plan: The patient will recover in the post-anesthesia care unit and then go to the floor on the medicine service. The patient will receive two post-operative doses of ancef. The patient will get another dose of TXA. The patient will be non weight bearing on the right lower extremity in his splint. The patient will work with physical therapy. The patient's disposition will be determined by the medicine service.       Willia Craze, MD Orthopedic Surgeon

## 2022-12-08 NOTE — Progress Notes (Signed)
CCC Pre-op Review  Pre-op checklist: asked floor RN to complete  NPO: since MN  Labs: cbg 335 @1136   Consent: asked RN to obtain  H&P: consult note 8/22  Vitals: WNL  O2 requirements: 99% RA  MAR/PTA review: completed Ancef to OR Norco PRN   IV: LAC  Floor nurse name:  Emeline General LPN 5N  Additional info:

## 2022-12-08 NOTE — Progress Notes (Signed)
Echocardiogram 2D Echocardiogram has been performed.  Lucendia Herrlich 12/08/2022, 1:47 PM

## 2022-12-08 NOTE — TOC CAGE-AID Note (Signed)
Transition of Care [facility]) - CAGE-AID Screening   Patient Details  Name: Greg Robertson MRN: [id number] Date of Birth: Oct 13, 1950  Transition of [facility]) CM/SW Contact:    Leota Sauers, RN Phone Number: 12/08/2022, 10:36 PM   Clinical Narrative:  Patient denies current use of alcohol and illicit drugs. In past endorses alcohol and marijuana. Resources not given at this time.   CAGE-AID Screening:    Have You Ever Felt You Ought to Cut Down on Your Drinking or Drug Use?: No Have People Annoyed You By Critizing Your Drinking Or Drug Use?: No Have You Felt Bad Or Guilty About Your Drinking Or Drug Use?: No Have You Ever Had a Drink or Used Drugs First Thing In The Morning to Steady Your Nerves or to Get Rid of a Hangover?: No CAGE-AID Score: 0  Substance Abuse Education Offered: No

## 2022-12-08 NOTE — ED Notes (Signed)
Requested transport for patient to floor.

## 2022-12-08 NOTE — Plan of Care (Signed)

## 2022-12-08 NOTE — Plan of Care (Signed)
  Problem: Education: Goal: Knowledge of General Education information will improve Description: Including pain rating scale, medication(s)/side effects and non-pharmacologic comfort measures Outcome: Progressing   Problem: Health Behavior/Discharge Planning: Goal: Ability to manage health-related needs will improve Outcome: Progressing   Problem: Pain Managment: Goal: General experience of comfort will improve Outcome: Progressing   

## 2022-12-08 NOTE — Progress Notes (Signed)
PROGRESS NOTE    Greg Robertson  VOZ:[id number] DOB: 01-Sep-1950 DOA: 12/07/2022 PCP: Dettinger, Elige Radon, MD   Brief Narrative:  Greg Robertson is a 72 y.o. male with a known history of atrial fibrillation on Xarelto, CHF (EF 55 to 60% in March 2024), diabetes, hypertension, CKD2, CVA, colon cancer being admitted to our facility for trimalleolar ankle fracture on the right.  Hospitalist called for admission, orthopedics called in consult.  Assessment & Plan:   Principal Problem:   Closed fracture dislocation of right ankle joint   Acute symptomatic hypoglycemia, chronic Transient altered mental status Brittle diabetes, type I, uncontrolled with hyperglycemia -A1c 9.2 -Patient reports feeling hypoglycemic at home just prior to mechanical fall as below.  Likely etiology for his near syncope given his report and consistent symptoms. -He states he is a brittle diabetic, poorly controlled with profound hypoglycemia as such he tends to keep his glucose on the higher end of normal -Recently evaluated in cardiology office with Dr Shirlee Latch -no indication for consult or follow-up at this point given symptoms are consistent with hypoglycemia  Trimalleolar fracture and dislocation of the right ankle -Ortho following, tentative plan for ORIF later today -Pain currently well-controlled  -Echo shows EF 45 to 50% with mildly decreased LV function global hypokinesis with indeterminate diastolic parameters. -EKG sinus rhythm without ST abnormalities -Moderate risk procedure; moderate risk patient given HF/Insulin use/age. Medically stable for surgery - patient agrees with risk/benefits of the procedure as outlined.  CKD2, near baseline -Continue fluids perioperatively, creatinine stable at baseline   History of atrial fibrillation, rate controlled -Currently in sinus rhythm -Continue carvedilol -Hold Xarelto  History of CHF -minimally depressed EF at 45-50% - Continue Lasix, Coreg,  Entresto, Lipitor -Echo completed 8/22 as above   DVT prophylaxis: SCDs Start: 12/08/22 0130   Code Status:   Code Status: Full Code  Family Communication: None present  Status is: Inpatient  Dispo: The patient is from: Home              Anticipated d/c is to: To be determined              Anticipated d/c date is: 40 to 72 hours              Patient currently not medically stable for discharge given need for ongoing evaluation, surgical intervention  Consultants:  Ortho  Procedures:  ORIF planned 12/08/2022  Antimicrobials:  Cefazolin perioperatively  Subjective: No acute issues or events overnight, patient feels quite well compared to yesterday when he notes prior hypoglycemia.  Otherwise denies nausea vomiting diarrhea constipation any fevers chills chest pain shortness of breath  Objective: Vitals:   12/07/22 2345 12/08/22 0045 12/08/22 0055 12/08/22 0123  BP: (!) 145/71 (!) 153/89  (!) 156/69  Pulse: 85 81  84  Resp: 16 19  17   Temp: (!) 97.2 F (36.2 C)   98.3 F (36.8 C)  TempSrc: Oral   Oral  SpO2: 100% 98%  100%  Weight:   85.5 kg   Height:   6\' 1"  (1.854 m)     Intake/Output Summary (Last 24 hours) at 12/08/2022 0811 Last data filed at 12/08/2022 0600 Gross per 24 hour  Intake 144.89 ml  Output 620 ml  Net -475.11 ml   Filed Weights   12/08/22 0055  Weight: 85.5 kg    Examination:  General:  Pleasantly resting in bed, No acute distress. HEENT:  Normocephalic atraumatic.  Sclerae nonicteric, noninjected.  Extraocular movements intact bilaterally.  Neck:  Without mass or deformity.  Trachea is midline. Lungs:  Clear to auscultate bilaterally without rhonchi, wheeze, or rales. Heart:  Regular rate and rhythm.  Without murmurs, rubs, or gallops. Abdomen:  Soft, nontender, nondistended.  Without guarding or rebound. Extremities: Without cyanosis, clubbing, edema, or obvious deformity. Skin:  Warm and dry, no erythema.  Data Reviewed: I have  personally reviewed following labs and imaging studies  CBC: Recent Labs  Lab 12/07/22 2343  WBC 12.7*  NEUTROABS 10.1*  HGB 13.9  HCT 40.6  MCV 88.8  PLT 161   Basic Metabolic Panel: Recent Labs  Lab 12/05/22 1355 12/07/22 2343  NA 135 140  K 4.1 4.0  CL 99 108  CO2 27 26  GLUCOSE 368* 112*  BUN 17 20  CREATININE 1.21 1.41*  CALCIUM 8.5* 8.9   GFR: Estimated Creatinine Clearance: 54.3 mL/min (A) (by C-G formula based on SCr of 1.41 mg/dL (H)). CBG: Recent Labs  Lab 12/07/22 2252 12/07/22 2335 12/08/22 0121  GLUCAP 106* 117* 119*   No results found for this or any previous visit (from the past 240 hour(s)).   Radiology Studies: DG Chest Port 1 View  Result Date: 12/08/2022 CLINICAL DATA:  Preop right ankle.  Shortness of breath. EXAM: PORTABLE CHEST 1 VIEW COMPARISON:  09/29/2020 FINDINGS: Heart and mediastinal contours are within normal limits. No focal opacities or effusions. No acute bony abnormality. Aortic atherosclerosis. IMPRESSION: No active cardiopulmonary disease. Electronically Signed   By: Charlett Nose M.D.   On: 12/08/2022 01:20   DG Ankle 2 Views Right  Result Date: 12/08/2022 CLINICAL DATA:  Postreduction EXAM: RIGHT ANKLE - 2 VIEW COMPARISON:  12/07/2022 FINDINGS: Interval reduction of the previously seen fracture dislocation. Continued lateral subluxation of the talus relative to the tibia. Mildly displaced medial malleolar and distal fibular fractures. No definite posterior malleolar fracture seen on this repeat study IMPRESSION: Interval reduction with continued lateral subluxation and displacement of the by malleolar fracture. No definite posterior malleolar fracture seen currently. Electronically Signed   By: Charlett Nose M.D.   On: 12/08/2022 00:53   DG Ankle Right Port  Result Date: 12/08/2022 CLINICAL DATA:  Fall, deformity and bruising EXAM: PORTABLE RIGHT ANKLE - 2 VIEW COMPARISON:  None Available. FINDINGS: There is a fracture dislocation  of the right ankle. Fracture involving the medial and posterior malleoli as well as distal fibula. Lateral dislocation of the talus relative to the tibia. Vascular calcifications. IMPRESSION: Trimalleolar fracture dislocation. Electronically Signed   By: Charlett Nose M.D.   On: 12/08/2022 00:07        Scheduled Meds:  ascorbic acid  500 mg Oral Daily   atorvastatin  40 mg Oral Daily   carvedilol  12.5 mg Oral QPC breakfast   carvedilol  6.25 mg Oral Q supper   cetirizine  10 mg Oral Daily   cholecalciferol  4,000 Units Oral Daily   furosemide  40 mg Oral Q1500   insulin aspart  0-5 Units Subcutaneous QHS   insulin aspart  0-9 Units Subcutaneous TID WC   multivitamin with minerals  1 tablet Oral Daily   sacubitril-valsartan  1 tablet Oral BID   sodium chloride flush  3 mL Intravenous Q12H   sodium zirconium cyclosilicate  10 g Oral QHS   Tdap  0.5 mL Intramuscular Once   Continuous Infusions:   ceFAZolin (ANCEF) IV     lactated ringers 10 mL/hr at 12/08/22 0600   tranexamic acid  LOS: 0 days   Time spent:  Azucena Fallen, DO [facility]  If 7PM-7AM, please contact night-coverage [email or web address]  12/08/2022, 8:11 AM

## 2022-12-09 ENCOUNTER — Encounter (HOSPITAL_COMMUNITY): Payer: Self-pay | Admitting: Family Medicine

## 2022-12-09 ENCOUNTER — Inpatient Hospital Stay (HOSPITAL_COMMUNITY): Payer: Medicare Other | Admitting: Anesthesiology

## 2022-12-09 ENCOUNTER — Inpatient Hospital Stay (HOSPITAL_COMMUNITY): Payer: Medicare Other

## 2022-12-09 ENCOUNTER — Other Ambulatory Visit: Payer: Self-pay

## 2022-12-09 DIAGNOSIS — I110 Hypertensive heart disease with heart failure: Secondary | ICD-10-CM

## 2022-12-09 DIAGNOSIS — I4891 Unspecified atrial fibrillation: Secondary | ICD-10-CM

## 2022-12-09 DIAGNOSIS — S8291XA Unspecified fracture of right lower leg, initial encounter for closed fracture: Secondary | ICD-10-CM

## 2022-12-09 DIAGNOSIS — E109 Type 1 diabetes mellitus without complications: Secondary | ICD-10-CM | POA: Diagnosis not present

## 2022-12-09 DIAGNOSIS — I509 Heart failure, unspecified: Secondary | ICD-10-CM

## 2022-12-09 DIAGNOSIS — S82891A Other fracture of right lower leg, initial encounter for closed fracture: Secondary | ICD-10-CM | POA: Diagnosis not present

## 2022-12-09 LAB — BASIC METABOLIC PANEL
Anion gap: 7 (ref 5–15)
BUN: 16 mg/dL (ref 8–23)
CO2: 23 mmol/L (ref 22–32)
Calcium: 8 mg/dL — ABNORMAL LOW (ref 8.9–10.3)
Chloride: 105 mmol/L (ref 98–111)
Creatinine, Ser: 1.39 mg/dL — ABNORMAL HIGH (ref 0.61–1.24)
GFR, Estimated: 54 mL/min — ABNORMAL LOW (ref >60)
Glucose, Bld: 210 mg/dL — ABNORMAL HIGH (ref 70–99)
Potassium: 3.5 mmol/L (ref 3.5–5.1)
Sodium: 135 mmol/L (ref 135–145)

## 2022-12-09 LAB — GLUCOSE, CAPILLARY
Glucose-Capillary: 170 mg/dL — ABNORMAL HIGH (ref 70–99)
Glucose-Capillary: 197 mg/dL — ABNORMAL HIGH (ref 70–99)
Glucose-Capillary: 283 mg/dL — ABNORMAL HIGH (ref 70–99)
Glucose-Capillary: 417 mg/dL — ABNORMAL HIGH (ref 70–99)
Glucose-Capillary: 399 mg/dL — ABNORMAL HIGH (ref 70–99)
Glucose-Capillary: 144 mg/dL — ABNORMAL HIGH (ref 70–99)

## 2022-12-09 LAB — CBC
HCT: 32.5 % — ABNORMAL LOW (ref 39.0–52.0)
Hemoglobin: 10.8 g/dL — ABNORMAL LOW (ref 13.0–17.0)
MCH: 29.6 pg (ref 26.0–34.0)
MCHC: 33.2 g/dL (ref 30.0–36.0)
MCV: 89 fL (ref 80.0–100.0)
Platelets: 116 10*3/uL — ABNORMAL LOW (ref 150–400)
RBC: 3.65 MIL/uL — ABNORMAL LOW (ref 4.22–5.81)
RDW: 13 % (ref 11.5–15.5)
WBC: 9.6 10*3/uL (ref 4.0–10.5)
nRBC: 0 % (ref 0.0–0.2)

## 2022-12-09 MED ORDER — HYDROMORPHONE HCL 1 MG/ML IJ SOLN: 0.2500 mg | INTRAMUSCULAR | Status: DC | PRN

## 2022-12-09 MED ORDER — PHENYLEPHRINE HCL-NACL 20-0.9 MG/250ML-% IV SOLN
INTRAVENOUS | Status: DC | PRN
  Administered 2022-12-09: 40 ug/min via INTRAVENOUS

## 2022-12-09 MED ORDER — ONDANSETRON HCL 4 MG/2ML IJ SOLN
INTRAMUSCULAR | Status: DC | PRN
  Administered 2022-12-09: 4 mg via INTRAVENOUS

## 2022-12-09 MED ORDER — LACTATED RINGERS IV SOLN: INTRAVENOUS | Status: DC | PRN

## 2022-12-09 MED ORDER — SUCCINYLCHOLINE CHLORIDE 200 MG/10ML IV SOSY
PREFILLED_SYRINGE | INTRAVENOUS | Status: AC
  Filled 2022-12-09: qty 10

## 2022-12-09 MED ORDER — ONDANSETRON HCL 4 MG/2ML IJ SOLN
INTRAMUSCULAR | Status: AC
  Filled 2022-12-09: qty 2

## 2022-12-09 MED ORDER — FENTANYL CITRATE (PF) 100 MCG/2ML IJ SOLN
INTRAMUSCULAR | Status: DC | PRN
  Administered 2022-12-09 (×5): 50 ug via INTRAVENOUS

## 2022-12-09 MED ORDER — HYDROCODONE-ACETAMINOPHEN 5-325 MG PO TABS
1.0000 | ORAL_TABLET | ORAL | Status: DC | PRN
  Administered 2022-12-09 – 2022-12-13 (×5): 2 via ORAL
  Filled 2022-12-09 (×5): qty 2

## 2022-12-09 MED ORDER — ALBUMIN HUMAN 5 % IV SOLN: INTRAVENOUS | Status: DC | PRN

## 2022-12-09 MED ORDER — LIDOCAINE HCL (CARDIAC) PF 100 MG/5ML IV SOSY
PREFILLED_SYRINGE | INTRAVENOUS | Status: DC | PRN
  Administered 2022-12-09: 100 mg via INTRAVENOUS

## 2022-12-09 MED ORDER — PHENYLEPHRINE 80 MCG/ML (10ML) SYRINGE FOR IV PUSH (FOR BLOOD PRESSURE SUPPORT)
PREFILLED_SYRINGE | INTRAVENOUS | Status: AC
  Filled 2022-12-09: qty 10

## 2022-12-09 MED ORDER — ACETAMINOPHEN 10 MG/ML IV SOLN
INTRAVENOUS | Status: DC | PRN
  Administered 2022-12-09: 1000 mg via INTRAVENOUS

## 2022-12-09 MED ORDER — PHENYLEPHRINE HCL (PRESSORS) 10 MG/ML IV SOLN
INTRAVENOUS | Status: DC | PRN
  Administered 2022-12-09: 80 ug via INTRAVENOUS
  Administered 2022-12-09 (×3): 160 ug via INTRAVENOUS

## 2022-12-09 MED ORDER — TRANEXAMIC ACID-NACL 1000-0.7 MG/100ML-% IV SOLN
1000.0000 mg | Freq: Once | INTRAVENOUS | Status: AC
  Filled 2022-12-09: qty 100

## 2022-12-09 MED ORDER — DEXAMETHASONE SODIUM PHOSPHATE 10 MG/ML IJ SOLN
INTRAMUSCULAR | Status: AC
  Filled 2022-12-09: qty 1

## 2022-12-09 MED ORDER — HYDROMORPHONE HCL 1 MG/ML IJ SOLN
INTRAMUSCULAR | Status: AC
  Filled 2022-12-09: qty 1

## 2022-12-09 MED ORDER — ACETAMINOPHEN 10 MG/ML IV SOLN
INTRAVENOUS | Status: AC
  Filled 2022-12-09: qty 100

## 2022-12-09 MED ORDER — MIDAZOLAM HCL 2 MG/2ML IJ SOLN
INTRAMUSCULAR | Status: AC
  Filled 2022-12-09: qty 2

## 2022-12-09 MED ORDER — FENTANYL CITRATE (PF) 250 MCG/5ML IJ SOLN
INTRAMUSCULAR | Status: AC
  Filled 2022-12-09: qty 5

## 2022-12-09 MED ORDER — ROCURONIUM BROMIDE 10 MG/ML (PF) SYRINGE
PREFILLED_SYRINGE | INTRAVENOUS | Status: AC
  Filled 2022-12-09: qty 10

## 2022-12-09 MED ORDER — CEFAZOLIN SODIUM-DEXTROSE 2-4 GM/100ML-% IV SOLN
2.0000 g | Freq: Three times a day (TID) | INTRAVENOUS | Status: AC
  Administered 2022-12-09 (×3): 2 g via INTRAVENOUS
  Filled 2022-12-09 (×3): qty 100

## 2022-12-09 MED ORDER — RIVAROXABAN 20 MG PO TABS
20.0000 mg | ORAL_TABLET | Freq: Every day | ORAL | Status: DC
  Administered 2022-12-09 – 2022-12-12 (×4): 20 mg via ORAL
  Filled 2022-12-09 (×4): qty 1

## 2022-12-09 MED ORDER — SUGAMMADEX SODIUM 200 MG/2ML IV SOLN
INTRAVENOUS | Status: DC | PRN
  Administered 2022-12-09: 200 mg via INTRAVENOUS

## 2022-12-09 MED ORDER — INSULIN ASPART 100 UNIT/ML IJ SOLN
10.0000 [IU] | Freq: Once | INTRAMUSCULAR | Status: AC
  Administered 2022-12-09: 10 [IU] via SUBCUTANEOUS

## 2022-12-09 MED ORDER — EPHEDRINE SULFATE (PRESSORS) 50 MG/ML IJ SOLN
INTRAMUSCULAR | Status: DC | PRN
  Administered 2022-12-09: 10 mg via INTRAVENOUS

## 2022-12-09 MED ORDER — ONDANSETRON HCL 4 MG/2ML IJ SOLN: 4.0000 mg | Freq: Once | INTRAMUSCULAR | Status: DC | PRN

## 2022-12-09 MED ORDER — ROCURONIUM BROMIDE 100 MG/10ML IV SOLN
INTRAVENOUS | Status: DC | PRN
  Administered 2022-12-09: 60 mg via INTRAVENOUS

## 2022-12-09 MED ORDER — KETOROLAC TROMETHAMINE 30 MG/ML IJ SOLN: 15.0000 mg | Freq: Once | INTRAMUSCULAR | Status: DC | PRN

## 2022-12-09 MED ORDER — CEFAZOLIN SODIUM 1 G IJ SOLR
INTRAMUSCULAR | Status: AC
  Filled 2022-12-09: qty 20

## 2022-12-09 MED ORDER — LIDOCAINE 2% (20 MG/ML) 5 ML SYRINGE
INTRAMUSCULAR | Status: AC
  Filled 2022-12-09: qty 5

## 2022-12-09 MED ORDER — PROPOFOL 10 MG/ML IV BOLUS
INTRAVENOUS | Status: DC | PRN
Start: 2022-12-09 — End: 2022-12-09
  Administered 2022-12-09: 120 mg via INTRAVENOUS

## 2022-12-09 MED ORDER — EPHEDRINE 5 MG/ML INJ
INTRAVENOUS | Status: AC
  Filled 2022-12-09: qty 5

## 2022-12-09 NOTE — Anesthesia Preprocedure Evaluation (Addendum)
Anesthesia Evaluation  Patient identified by MRN, date of birth, ID band Patient awake    Reviewed: Allergy & Precautions, H&P , NPO status , Patient's Chart, lab work & pertinent test results  Airway Mallampati: II  TM Distance: >3 FB Neck ROM: Full    Dental  (+) Poor Dentition, Missing, Chipped, Dental Advisory Given   Pulmonary COPD, former smoker   Pulmonary exam normal breath sounds clear to auscultation       Cardiovascular hypertension, Pt. on medications +CHF  Normal cardiovascular exam+ dysrhythmias Atrial Fibrillation  Rhythm:Regular Rate:Normal  . Left ventricular ejection fraction, by estimation, is 45 to 50%. The  left ventricle has mildly decreased function. The left ventricle  demonstrates global hypokinesis. Left ventricular diastolic parameters are  indeterminate.   2. Right ventricular systolic function is normal. The right ventricular  size is normal. There is normal pulmonary artery systolic pressure.   3. The mitral valve is normal in structure. No evidence of mitral valve  regurgitation. No evidence of mitral stenosis.   4. The aortic valve is normal in structure. Aortic valve regurgitation is  not visualized. No aortic stenosis is present.   5. The inferior vena cava is normal in size with greater than 50%  respiratory variability, suggesting right atrial pressure of 3 mmHg.   Comparison(s): Prior images reviewed side by side. Changes from prior  study are noted. EF slightly less vigorous than prior, without focal wall  motion abnormalities.      Neuro/Psych negative neurological ROS  negative psych ROS   GI/Hepatic negative GI ROS, Neg liver ROS,,,  Endo/Other  diabetes, Type 1, Insulin Dependent    Renal/GU negative Renal ROS  negative genitourinary   Musculoskeletal negative musculoskeletal ROS (+)    Abdominal   Peds negative pediatric ROS (+)  Hematology negative hematology  ROS (+)   Anesthesia Other Findings   Reproductive/Obstetrics negative OB ROS                             Anesthesia Physical Anesthesia Plan  ASA: 3  Anesthesia Plan: General   Post-op Pain Management: Ofirmev IV (intra-op)* and Tylenol PO (pre-op)*   Induction: Intravenous  PONV Risk Score and Plan: 2 and Ondansetron and Dexamethasone  Airway Management Planned: Oral ETT  Additional Equipment:   Intra-op Plan:   Post-operative Plan: Extubation in OR  Informed Consent: I have reviewed the patients History and Physical, chart, labs and discussed the procedure including the risks, benefits and alternatives for the proposed anesthesia with the patient or authorized representative who has indicated his/her understanding and acceptance.     Dental advisory given  Plan Discussed with: CRNA and Surgeon  Anesthesia Plan Comments:         Anesthesia Quick Evaluation

## 2022-12-09 NOTE — Progress Notes (Signed)
PROGRESS NOTE    RAHEIM KOSMATKA  ZOX:[id number] DOB: 1951-02-27 DOA: 12/07/2022 PCP: Dettinger, Elige Radon, MD   Brief Narrative:  Trinton Macfadyen is a 72 y.o. male with a known history of atrial fibrillation on Xarelto, CHF (EF 55 to 60% in March 2024), diabetes, hypertension, CKD2, CVA, colon cancer being admitted to our facility for trimalleolar ankle fracture on the right.  Hospitalist called for admission, orthopedics called in consult.  Assessment & Plan:   Principal Problem:   Closed fracture dislocation of right ankle joint   Acute symptomatic hypoglycemia, chronic Transient altered mental status Brittle diabetes, type I, uncontrolled with hyperglycemia -A1c 9.2 -Patient reports feeling hypoglycemic at home just prior to mechanical fall as below.  Likely etiology for his near syncope given his report and consistent symptoms. -He states he is a brittle diabetic, poorly controlled with profound hypoglycemia as such he tends to keep his glucose on the higher end of normal -Recently evaluated in cardiology office with Dr Shirlee Latch -no indication for consult or follow-up at this point given symptoms are consistent with hypoglycemia -Continue sliding scale insulin only at this time, will transition patient back to his home medications at discharge  Trimalleolar fracture and dislocation of the right ankle -Ortho following, status post ORIF, tolerated well -Pain currently well-controlled -titrate off IV narcotics -Nonweightbearing per Ortho, PT following  CKD2, near baseline -Continue fluids perioperatively, creatinine stable at baseline   History of atrial fibrillation, rate controlled -Currently in sinus rhythm -Continue carvedilol -Hold Xarelto  History of CHF -minimally depressed EF at 45-50% - Continue Lasix, Coreg, Entresto, Lipitor -Echo completed 8/22 as above   DVT prophylaxis: SCDs Start: 12/08/22 0130   Code Status:   Code Status: Full Code  Family  Communication: None present  Status is: Inpatient  Dispo: The patient is from: Home              Anticipated d/c is to: SNF              Anticipated d/c date is: 48 to 72 hours              Patient currently not medically stable for discharge given need for ongoing evaluation, surgical intervention  Consultants:  Ortho  Procedures:  ORIF 12/09/2022  Antimicrobials:  Cefazolin perioperatively  Subjective: No acute issues or events overnight, tolerated procedure quite well, pain currently well-controlled denies nausea vomiting diarrhea constipation headache fevers chills chest pain  Objective: Vitals:   12/09/22 0500 12/09/22 0515 12/09/22 0535 12/09/22 0822  BP: (!) 139/59 (!) 127/57 (!) 140/64 (!) 122/58  Pulse: 83 81 85 76  Resp: 13 11 11 18   Temp:   97.9 F (36.6 C) 97.9 F (36.6 C)  TempSrc:   Oral   SpO2: 100% 97% 95% 95%  Weight:      Height:        Intake/Output Summary (Last 24 hours) at 12/09/2022 0823 Last data filed at 12/09/2022 0439 Gross per 24 hour  Intake 1450 ml  Output 275 ml  Net 1175 ml   Filed Weights   12/08/22 0055  Weight: 85.5 kg    Examination:  General:  Pleasantly resting in bed, No acute distress. HEENT:  Normocephalic atraumatic.  Sclerae nonicteric, noninjected.  Extraocular movements intact bilaterally. Neck:  Without mass or deformity.  Trachea is midline. Lungs:  Clear to auscultate bilaterally without rhonchi, wheeze, or rales. Heart:  Regular rate and rhythm.  Without murmurs, rubs, or gallops. Abdomen:  Soft, nontender, nondistended.  Without guarding or rebound. Extremities: Without cyanosis, clubbing, edema, or obvious deformity.  Right ankle bandage clean dry intact Skin:  Warm and dry, no erythema.  Data Reviewed: I have personally reviewed following labs and imaging studies  CBC: Recent Labs  Lab 12/07/22 2343 12/08/22 1055 12/09/22 0636  WBC 12.7* 8.1 9.6  NEUTROABS 10.1*  --   --   HGB 13.9 12.4* 10.8*  HCT  40.6 35.9* 32.5*  MCV 88.8 88.2 89.0  PLT 161 136* 116*   Basic Metabolic Panel: Recent Labs  Lab 12/05/22 1355 12/07/22 2343 12/08/22 1055 12/09/22 0636  NA 135 140 134* 135  K 4.1 4.0 3.8 3.5  CL 99 108 104 105  CO2 27 26 22 23   GLUCOSE 368* 112* 343* 210*  BUN 17 20 15 16   CREATININE 1.21 1.41* 1.37* 1.39*  CALCIUM 8.5* 8.9 8.2* 8.0*   GFR: Estimated Creatinine Clearance: 55.1 mL/min (A) (by C-G formula based on SCr of 1.39 mg/dL (H)). CBG: Recent Labs  Lab 12/08/22 0845 12/08/22 1136 12/08/22 1655 12/08/22 2100 12/09/22 0453  GLUCAP 87 335* 190* 162* 170*   Recent Results (from the past 240 hour(s))  Surgical pcr screen     Status: None   Collection Time: 12/08/22  8:21 AM   Specimen: Nasal Mucosa; Nasal Swab  Result Value Ref Range Status   MRSA, PCR NEGATIVE NEGATIVE Final   Staphylococcus aureus NEGATIVE NEGATIVE Final    Comment: (NOTE) The Xpert SA Assay (FDA approved for NASAL specimens in patients 48 years of age and older), is one component of a comprehensive surveillance program. It is not intended to diagnose infection nor to guide or monitor treatment. Performed at [facility], [address], [place], [place] [address]      Radiology Studies: DG Ankle 2 Views Right  Result Date: 12/09/2022 CLINICAL DATA:  Right ankle ORIF, intraoperative examination EXAM: RIGHT ANKLE - 2 VIEW COMPARISON:  None Available. FINDINGS: Three fluoroscopic intraoperative radiographs are presented for interpretation postprocedurally. These images demonstrate bimalleolar right ankle fracture ORIF utilizing a lateral fibular plate, cortical and single inter fragmentary screw, dual medial malleolar lag screws, and a tight rope reconstruction. Fracture fragments are in near anatomic alignment on terminal images. Ankle mortise appears aligned. No unexpected fracture or dislocation. Fluoroscopic images: 3 Fluoroscopic time: 68.9 seconds Fluoroscopic dose: 2.79 mGy  IMPRESSION: 1. Bimalleolar right ankle fracture ORIF. Electronically Signed   By: Helyn Numbers M.D.   On: 12/09/2022 04:35   DG C-Arm 1-60 Min-No Report  Result Date: 12/09/2022 Fluoroscopy was utilized by the requesting physician.  No radiographic interpretation.   DG C-Arm 1-60 Min-No Report  Result Date: 12/09/2022 Fluoroscopy was utilized by the requesting physician.  No radiographic interpretation.   ECHOCARDIOGRAM COMPLETE  Result Date: 12/08/2022    ECHOCARDIOGRAM REPORT   Patient Name:   Greg Robertson Date of Exam: 12/08/2022 Medical Rec #:  [id number]          Height:       73.0 in Accession #:    [id number]         Weight:       188.5 lb Date of Birth:  1950-08-06          BSA:          2.098 m Patient Age:    71 years           BP:           156/69 mmHg Patient Gender:  M                  HR:           79 bpm. Exam Location:  Inpatient Procedure: 2D Echo, Cardiac Doppler and Color Doppler Indications:    Preoperative evaluation  History:        Patient has prior history of Echocardiogram examinations, most                 recent 07/11/2022. CHF, COPD and Stroke, Arrythmias:Atrial                 Fibrillation; Risk Factors:Hypertension, Diabetes and                 Dyslipidemia.  Sonographer:    Lucendia Herrlich Referring Phys: [id number] ALEXIS HUGELMEYER IMPRESSIONS  1. Left ventricular ejection fraction, by estimation, is 45 to 50%. The left ventricle has mildly decreased function. The left ventricle demonstrates global hypokinesis. Left ventricular diastolic parameters are indeterminate.  2. Right ventricular systolic function is normal. The right ventricular size is normal. There is normal pulmonary artery systolic pressure.  3. The mitral valve is normal in structure. No evidence of mitral valve regurgitation. No evidence of mitral stenosis.  4. The aortic valve is normal in structure. Aortic valve regurgitation is not visualized. No aortic stenosis is present.  5. The inferior vena cava  is normal in size with greater than 50% respiratory variability, suggesting right atrial pressure of 3 mmHg. Comparison(s): Prior images reviewed side by side. Changes from prior study are noted. EF slightly less vigorous than prior, without focal wall motion abnormalities. FINDINGS  Left Ventricle: Left ventricular ejection fraction, by estimation, is 45 to 50%. The left ventricle has mildly decreased function. The left ventricle demonstrates global hypokinesis. The left ventricular internal cavity size was normal in size. There is  no left ventricular hypertrophy. Left ventricular diastolic parameters are indeterminate. Right Ventricle: The right ventricular size is normal. Right vetricular wall thickness was not well visualized. Right ventricular systolic function is normal. There is normal pulmonary artery systolic pressure. The tricuspid regurgitant velocity is 1.99 m/s, and with an assumed right atrial pressure of 3 mmHg, the estimated right ventricular systolic pressure is 18.8 mmHg. Left Atrium: Left atrial size was normal in size. Right Atrium: Right atrial size was normal in size. Pericardium: There is no evidence of pericardial effusion. Mitral Valve: The mitral valve is normal in structure. No evidence of mitral valve regurgitation. No evidence of mitral valve stenosis. Tricuspid Valve: The tricuspid valve is grossly normal. Tricuspid valve regurgitation is trivial. No evidence of tricuspid stenosis. Aortic Valve: The aortic valve is normal in structure. Aortic valve regurgitation is not visualized. No aortic stenosis is present. Aortic valve peak gradient measures 5.2 mmHg. Pulmonic Valve: The pulmonic valve was not well visualized. Pulmonic valve regurgitation is trivial. No evidence of pulmonic stenosis. Aorta: The aortic root, ascending aorta, aortic arch and descending aorta are all structurally normal, with no evidence of dilitation or obstruction. Venous: The inferior vena cava is normal in size  with greater than 50% respiratory variability, suggesting right atrial pressure of 3 mmHg. IAS/Shunts: The atrial septum is grossly normal.  LEFT VENTRICLE PLAX 2D LVIDd:         3.60 cm   Diastology LVIDs:         2.70 cm   LV e' medial:    6.53 cm/s LV PW:         1.10 cm  LV E/e' medial:  9.4 LV IVS:        0.90 cm   LV e' lateral:   6.22 cm/s LVOT diam:     2.40 cm   LV E/e' lateral: 9.9 LV SV:         69 LV SV Index:   33 LVOT Area:     4.52 cm  RIGHT VENTRICLE             IVC RV S prime:     13.10 cm/s  IVC diam: 1.40 cm TAPSE (M-mode): 2.0 cm LEFT ATRIUM             Index        RIGHT ATRIUM           Index LA diam:        2.80 cm 1.33 cm/m   RA Area:     13.40 cm LA Vol (A2C):   27.3 ml 13.01 ml/m  RA Volume:   30.60 ml  14.58 ml/m LA Vol (A4C):   49.1 ml 23.40 ml/m LA Biplane Vol: 36.5 ml 17.40 ml/m  AORTIC VALVE AV Area (Vmax): 3.37 cm AV Vmax:        114.00 cm/s AV Peak Grad:   5.2 mmHg LVOT Vmax:      85.03 cm/s LVOT Vmean:     54.633 cm/s LVOT VTI:       0.152 m  AORTA Ao Root diam: 3.30 cm Ao Asc diam:  3.10 cm MITRAL VALVE               TRICUSPID VALVE MV Area (PHT): 3.03 cm    TR Peak grad:   15.8 mmHg MV Decel Time: 250 msec    TR Vmax:        199.00 cm/s MV E velocity: 61.30 cm/s MV A velocity: 71.10 cm/s  SHUNTS MV E/A ratio:  0.86        Systemic VTI:  0.15 m                            Systemic Diam: 2.40 cm Jodelle Red MD Electronically signed by Jodelle Red MD Signature Date/Time: 12/08/2022/2:28:05 PM    Final    DG Chest Port 1 View  Result Date: 12/08/2022 CLINICAL DATA:  Preop right ankle.  Shortness of breath. EXAM: PORTABLE CHEST 1 VIEW COMPARISON:  09/29/2020 FINDINGS: Heart and mediastinal contours are within normal limits. No focal opacities or effusions. No acute bony abnormality. Aortic atherosclerosis. IMPRESSION: No active cardiopulmonary disease. Electronically Signed   By: Charlett Nose M.D.   On: 12/08/2022 01:20   DG Ankle 2 Views  Right  Result Date: 12/08/2022 CLINICAL DATA:  Postreduction EXAM: RIGHT ANKLE - 2 VIEW COMPARISON:  12/07/2022 FINDINGS: Interval reduction of the previously seen fracture dislocation. Continued lateral subluxation of the talus relative to the tibia. Mildly displaced medial malleolar and distal fibular fractures. No definite posterior malleolar fracture seen on this repeat study IMPRESSION: Interval reduction with continued lateral subluxation and displacement of the by malleolar fracture. No definite posterior malleolar fracture seen currently. Electronically Signed   By: Charlett Nose M.D.   On: 12/08/2022 00:53   DG Ankle Right Port  Result Date: 12/08/2022 CLINICAL DATA:  Fall, deformity and bruising EXAM: PORTABLE RIGHT ANKLE - 2 VIEW COMPARISON:  None Available. FINDINGS: There is a fracture dislocation of the right ankle. Fracture involving the medial and posterior malleoli as well as  distal fibula. Lateral dislocation of the talus relative to the tibia. Vascular calcifications. IMPRESSION: Trimalleolar fracture dislocation. Electronically Signed   By: Charlett Nose M.D.   On: 12/08/2022 00:07        Scheduled Meds:  ascorbic acid  500 mg Oral Daily   atorvastatin  40 mg Oral Daily   carvedilol  12.5 mg Oral QPC breakfast   carvedilol  6.25 mg Oral Q supper   cetirizine  10 mg Oral Daily   cholecalciferol  4,000 Units Oral Daily   furosemide  40 mg Oral Q1500   HYDROmorphone       insulin aspart  0-5 Units Subcutaneous QHS   insulin aspart  0-9 Units Subcutaneous TID WC   multivitamin with minerals  1 tablet Oral Daily   sacubitril-valsartan  1 tablet Oral BID   sodium chloride flush  3 mL Intravenous Q12H   sodium zirconium cyclosilicate  10 g Oral Daily   Tdap  0.5 mL Intramuscular Once   Continuous Infusions:   ceFAZolin (ANCEF) IV     lactated ringers 10 mL/hr at 12/08/22 0600     LOS: 1 day   Time spent:  Azucena Fallen, DO [facility]  If  7PM-7AM, please contact night-coverage [email or web address]  12/09/2022, 8:23 AM

## 2022-12-09 NOTE — Anesthesia Procedure Notes (Signed)
Procedure Name: Intubation Date/Time: 12/09/2022 1:49 AM  Performed by: Wynter Grave T, CRNAPre-anesthesia Checklist: Patient identified, Emergency Drugs available, Suction available and Patient being monitored Patient Re-evaluated:Patient Re-evaluated prior to induction Oxygen Delivery Method: Circle system utilized Preoxygenation: Pre-oxygenation with 100% oxygen Induction Type: IV induction Ventilation: Mask ventilation without difficulty and Oral airway inserted - appropriate to patient size Laryngoscope Size: Mac and 4 Grade View: Grade I Tube type: Oral Tube size: 7.5 mm Number of attempts: 1 Airway Equipment and Method: Stylet and Oral airway Placement Confirmation: ETT inserted through vocal cords under direct vision, positive ETCO2 and breath sounds checked- equal and bilateral Secured at: 22 cm Tube secured with: Tape Dental Injury: Teeth and Oropharynx as per pre-operative assessment

## 2022-12-09 NOTE — Progress Notes (Addendum)
Orthopedic Surgery Progress Note   Assessment: Patient is a 72 y.o. male with right ankle fracture/dislocation status post open reduction internal fixation   Plan: -Operative plans: complete -Diet: diabetic -DVT ppx: home xarelto -Antibiotics: ancef x2 post-op doses -Weight bearing status: non weight bearing right lower extremity in splint (can knee weight bear on that side) -PT evaluate and treat -Pain control -Dispo: per primary  ___________________________________________________________________________  Subjective: No acute events since surgery. He has returned to the floor. Pain adequately controlled with current medications. Wants to go back to sleep.    Physical Exam:  General: no acute distress, appears stated age Neurologic: alert, answering questions appropriately, following commands Respiratory: unlabored breathing on room air, symmetric chest rise Psychiatric: appropriate affect, normal cadence to speech  MSK:   -Right lower extremity  Splint in place and is intact with no drainage on it Plantarflexes and dorsiflexes toes Sensation intact to light touch on dorsal and plantar aspect of toes Foot warm and well perfused   Patient name: Greg Robertson Patient MRN: [id number] Date: 12/09/22

## 2022-12-09 NOTE — Discharge Instructions (Addendum)
Orthopedic Surgery Discharge Instructions  Patient name: Greg Robertson Fracture: right ankle fracture/dislocation Procedure Performed: right ankle fracture open reduction internal fixation Date of Surgery: 12/09/2022 Surgeon: Willia Craze, MD  Activity: You can put weight through your knee on the right lower extremity but should put any weight through the foot or ankle. You should keep your splint on at all times and dry at all times. You can use a waterproof bag to keep the splint dry as you shower. You will likely be non-weight bearing on the foot and ankle for a total of 3 months after surgery. Elevate the leg regularly with pillows under the ankle when sleeping and by propping it up when in a chair.  Incision Care: Your incisions are underneath the splint. There are permanent sutures that Dr. Christell Constant will have to remove in the office at your first post-operative visit. There may be some bloody drainage from the incision into the splint after surgery. This is normal. You do not need to replace the dressing or splint Continue to leave it in place until you are seen in the office. Should the dressing become saturated with blood or there is a lot of drainage or blood, please call the office for further instructions.   Medications: You have been prescribed Norco. This is a narcotic pain medication and should only be taken as prescribed. You should not drink alcohol or operate heavy machinery (including driving) while taking this medication. The Norco can cause constipation as a side effect. For that reason, you have been prescribed senna and miralax. These are both laxatives. You do not need to take this medication if you develop diarrhea. Should you remain constipated even while taking the senna and miralax, please use the miralax twice daily.   You may resume any home blood thinners (warfarin, lovenox, apixaban, plavix, xarelto, etc) after your surgery. Take these medications as they were  previously prescribed.  You should not use over-the-counter NSAIDs (ibuprofen, Aleve, Celebrex, naproxen, meloxicam, etc.) for pain relief because they can decrease your chance of fracture healing.   In order to set expectations for opioid prescriptions, you will only be prescribed opioids for a total of six weeks after surgery and, at two-weeks after surgery, your opioid prescription will start to tapered (decreased dosage and number of pills). If you have ongoing need for opioid medication six weeks after surgery, you will be referred to pain management. If you are already established with a provider that is giving you opioid medications, you should schedule an appointment with them for six weeks after surgery if you feel you are going to need another prescription. State law only allows for opioid prescriptions one week at a time. If you are running out of opioid medication near the end of the week, please call the office during business hours before running out so I can send you another prescription.    Diet: You are safe to resume your regular diet after surgery.   Reasons to Call the Office After Surgery: You should feel free to call the office with any concerns or questions you have in the post-operative period, but you should definitely notify the office if you develop: -shortness of breath, chest pain, or trouble breathing -excessive bleeding, drainage, redness, or swelling around the surgical site -fevers, chills, or pain that is getting worse with each passing day -persistent nausea or vomiting -new weakness in the right leg, new or worsening numbness or tingling in the right leg -other concerns about your surgery  Follow Up Appointments: You have a follow up appointment with Dr. Christell Constant on 12/29/2022 at 4:15pm. At that visit, Dr. Christell Constant will plan to remove the sutures, get x-rays of your ankle, and put your leg into a cast.   Office Information:  -Willia Craze, MD -Phone number:  [phone] -Address: [address]       [place], [place] [address]

## 2022-12-09 NOTE — Inpatient Diabetes Management (Signed)
Inpatient Diabetes Program Recommendations  AACE/ADA: New Consensus Statement on Inpatient Glycemic Control (2015)  Target Ranges:  Prepandial:   less than 140 mg/dL      Peak postprandial:   less than 180 mg/dL (1-2 hours)      Critically ill patients:  140 - 180 mg/dL   Lab Results  Component Value Date   GLUCAP 283 (H) 12/09/2022   HGBA1C 9.2 (H) 12/08/2022    Review of Glycemic Control  Diabetes history: DM 2 Outpatient Diabetes medications: Tresiba 20 units, Novolog 20-30 units tid (1 units for every 4 grams of carbs for breakfast and dinner, 1 unit for every 5 grams of carbs at lunchtime) Current orders for Inpatient glycemic control:  Novolog 0-9 units tid   Inpatient Diabetes Program Recommendations:   Noted that patient's blood sugars are trending up. CBG at noon 283 mg/dl.  If blood sugars continue to increase, recommend low dose basal insulin Semglee 8 units daily, continue Novolog 0-9 units correction scale TID.  Smith Mince RN BSN CDE Diabetes Coordinator Pager: [phone]  8am-5pm

## 2022-12-09 NOTE — Anesthesia Preprocedure Evaluation (Incomplete)
Anesthesia Evaluation  Patient identified by MRN, date of birth, ID band Patient awake    Reviewed: Allergy & Precautions, NPO status , Patient's Chart, lab work & pertinent test results  Airway Mallampati: II  TM Distance: >3 FB Neck ROM: Full    Dental  (+) Edentulous Upper, Edentulous Lower   Pulmonary neg pulmonary ROS, former smoker   Pulmonary exam normal        Cardiovascular hypertension, +CHF  + dysrhythmias Atrial Fibrillation  Rhythm:Regular Rate:Normal     Neuro/Psych negative neurological ROS  negative psych ROS   GI/Hepatic Neg liver ROS, PUD,GERD  Medicated,,  Endo/Other  diabetes, Type 2, Oral Hypoglycemic Agents    Renal/GU   negative genitourinary   Musculoskeletal  (+) Arthritis ,    Abdominal Normal abdominal exam  (+)   Peds  Hematology  (+) Blood dyscrasia, anemia   Anesthesia Other Findings   Reproductive/Obstetrics                             Anesthesia Physical Anesthesia Plan  ASA: 3  Anesthesia Plan: MAC and Regional   Post-op Pain Management:    Induction: Intravenous  PONV Risk Score and Plan: 1 and Ondansetron, Dexamethasone, Propofol infusion and Treatment may vary due to age or medical condition  Airway Management Planned: Simple Face Mask, Natural Airway and Nasal Cannula  Additional Equipment: None  Intra-op Plan:   Post-operative Plan:   Informed Consent: I have reviewed the patients History and Physical, chart, labs and discussed the procedure including the risks, benefits and alternatives for the proposed anesthesia with the patient or authorized representative who has indicated his/her understanding and acceptance.     Dental advisory given  Plan Discussed with: CRNA  Anesthesia Plan Comments:        Anesthesia Quick Evaluation  

## 2022-12-09 NOTE — Transfer of Care (Signed)
Immediate Anesthesia Transfer of Care Note  Patient: Greg Robertson  Procedure(s) Performed: OPEN REDUCTION INTERNAL FIXATION (ORIF) ANKLE FRACTURE (Right: Ankle)  Patient Location: PACU  Anesthesia Type:General  Level of Consciousness: awake, alert , and oriented  Airway & Oxygen Therapy: Patient Spontanous Breathing and Patient connected to nasal cannula oxygen  Post-op Assessment: Report given to RN, Post -op Vital signs reviewed and stable, and Patient moving all extremities  Post vital signs: Reviewed and stable  Last Vitals:  Vitals Value Taken Time  BP 139/59 12/09/22 0500  Temp    Pulse 77 12/09/22 0513  Resp 11 12/09/22 0513  SpO2 97 % 12/09/22 0513  Vitals shown include unfiled device data.  Last Pain:  Vitals:   12/09/22 0453  TempSrc:   PainSc: 4          Complications: No notable events documented.

## 2022-12-10 DIAGNOSIS — E109 Type 1 diabetes mellitus without complications: Secondary | ICD-10-CM | POA: Diagnosis not present

## 2022-12-10 DIAGNOSIS — S82891A Other fracture of right lower leg, initial encounter for closed fracture: Secondary | ICD-10-CM | POA: Diagnosis not present

## 2022-12-10 LAB — CBC
HCT: 33.1 % — ABNORMAL LOW (ref 39.0–52.0)
Hemoglobin: 11.1 g/dL — ABNORMAL LOW (ref 13.0–17.0)
MCH: 30.3 pg (ref 26.0–34.0)
MCHC: 33.5 g/dL (ref 30.0–36.0)
MCV: 90.4 fL (ref 80.0–100.0)
Platelets: 126 10*3/uL — ABNORMAL LOW (ref 150–400)
RBC: 3.66 MIL/uL — ABNORMAL LOW (ref 4.22–5.81)
RDW: 12.7 % (ref 11.5–15.5)
WBC: 9.5 10*3/uL (ref 4.0–10.5)
nRBC: 0 % (ref 0.0–0.2)

## 2022-12-10 LAB — BASIC METABOLIC PANEL
Anion gap: 16 — ABNORMAL HIGH (ref 5–15)
BUN: 21 mg/dL (ref 8–23)
CO2: 18 mmol/L — ABNORMAL LOW (ref 22–32)
Calcium: 8.3 mg/dL — ABNORMAL LOW (ref 8.9–10.3)
Chloride: 101 mmol/L (ref 98–111)
Creatinine, Ser: 1.67 mg/dL — ABNORMAL HIGH (ref 0.61–1.24)
GFR, Estimated: 43 mL/min — ABNORMAL LOW (ref >60)
Glucose, Bld: 223 mg/dL — ABNORMAL HIGH (ref 70–99)
Potassium: 3.8 mmol/L (ref 3.5–5.1)
Sodium: 135 mmol/L (ref 135–145)

## 2022-12-10 LAB — GLUCOSE, CAPILLARY
Glucose-Capillary: 265 mg/dL — ABNORMAL HIGH (ref 70–99)
Glucose-Capillary: 296 mg/dL — ABNORMAL HIGH (ref 70–99)
Glucose-Capillary: 364 mg/dL — ABNORMAL HIGH (ref 70–99)
Glucose-Capillary: 389 mg/dL — ABNORMAL HIGH (ref 70–99)
Glucose-Capillary: 413 mg/dL — ABNORMAL HIGH (ref 70–99)

## 2022-12-10 MED ORDER — INSULIN ASPART 100 UNIT/ML IJ SOLN
6.0000 [IU] | Freq: Once | INTRAMUSCULAR | Status: AC
  Administered 2022-12-10: 6 [IU] via SUBCUTANEOUS

## 2022-12-10 MED ORDER — SENNOSIDES-DOCUSATE SODIUM 8.6-50 MG PO TABS: 1.0000 | ORAL_TABLET | Freq: Two times a day (BID) | ORAL | 0 refills | Status: AC

## 2022-12-10 MED ORDER — HYDROCODONE-ACETAMINOPHEN 5-325 MG PO TABS: 1.0000 | ORAL_TABLET | ORAL | 0 refills | Status: AC | PRN

## 2022-12-10 MED ORDER — LATANOPROST 0.005 % OP SOLN
1.0000 [drp] | Freq: Every day | OPHTHALMIC | Status: DC
  Filled 2022-12-10: qty 2.5

## 2022-12-10 MED ORDER — INSULIN GLARGINE-YFGN 100 UNIT/ML ~~LOC~~ SOLN
10.0000 [IU] | Freq: Every day | SUBCUTANEOUS | Status: DC
  Administered 2022-12-10 – 2022-12-11 (×2): 10 [IU] via SUBCUTANEOUS
  Filled 2022-12-10 (×2): qty 0.1

## 2022-12-10 MED ORDER — POLYETHYLENE GLYCOL 3350 17 GM/SCOOP PO POWD
17.0000 g | Freq: Every day | ORAL | 0 refills | Status: AC
Start: 2022-12-10 — End: 2022-12-24

## 2022-12-10 NOTE — Evaluation (Signed)
Occupational Therapy Evaluation Patient Details Name: Greg Robertson MRN: [id number] DOB: 09-13-1950 Today's Date: 12/10/2022   History of Present Illness Patient is a 72 y.o. male with right ankle fracture/dislocation status post open reduction internal fixation, hx of R occipital CVA. PMH: Afib, poorly controlled DM1. former smoker, HTN.   Clinical Impression   Pt s/p above diagnosis. Pt in good spirits, A/O x4, RLE NWB, hx of stroke affects L visual field, states some instability in LLE, c/o 3/10 pain in R foot. Pt lives alone, PLOF ind, doesn't drive, has no assistance at home. Pt currently requires significant assistance with LB ADLs, transfers, and mobility. Pt would benefit from continued acute OT to improve with mobility and instruct on compensatory strategies, post acute rehab <3hrs/day recommended until able to ambulate and complete IADLs as needed prior to return home.       If plan is discharge home, recommend the following: A little help with walking and/or transfers;Assistance with cooking/housework;Help with stairs or ramp for entrance;Assist for transportation;A lot of help with bathing/dressing/bathroom    Functional Status Assessment  Patient has had a recent decline in their functional status and demonstrates the ability to make significant improvements in function in a reasonable and predictable amount of time.  Equipment Recommendations  Other (comment) (defer)    Recommendations for Other Services       Precautions / Restrictions Precautions Precautions: Fall Restrictions Weight Bearing Restrictions: Yes RLE Weight Bearing: Non weight bearing      Mobility Bed Mobility Overal bed mobility: Modified Independent             General bed mobility comments: increased time, difficulty scooting but good supine to sit/sit to supine    Transfers Overall transfer level: Needs assistance Equipment used: Rolling walker (2 wheels) Transfers: Sit to/from  Stand, Bed to chair/wheelchair/BSC Sit to Stand: Min assist, From elevated surface     Step pivot transfers: Contact guard assist     General transfer comment: min A to power STS, CGA up to 10 feet, able to pivot      Balance Overall balance assessment: Needs assistance Sitting-balance support: Feet supported Sitting balance-Leahy Scale: Good Sitting balance - Comments: some stiffness in back, good overall balance   Standing balance support: During functional activity, Reliant on assistive device for balance, Bilateral upper extremity supported Standing balance-Leahy Scale: Poor Standing balance comment: RLE NWB, cannot tolerate challenge, reliant on RW                           ADL either performed or assessed with clinical judgement   ADL Overall ADL's : Needs assistance/impaired Eating/Feeding: Independent;Sitting   Grooming: Set up;Sitting   Upper Body Bathing: Set up;Sitting   Lower Body Bathing: Moderate assistance;Sitting/lateral leans   Upper Body Dressing : Set up;Sitting   Lower Body Dressing: Moderate assistance;Sit to/from stand   Toilet Transfer: Minimal assistance;Rolling walker (2 wheels);BSC/3in1   Toileting- Clothing Manipulation and Hygiene: Moderate assistance;Sit to/from stand       Functional mobility during ADLs: Minimal assistance;Rolling walker (2 wheels) General ADL Comments: Pt requires asisstance for LB dressing, NWB RLE, min A for STS, ambulates up to 10 ft with RW min A.     Vision Baseline Vision/History: 1 Wears glasses Ability to See in Adequate Light: 2 Moderately impaired Patient Visual Report: Other (comment);Peripheral vision impairment (hx of stroke with L visual field)       Perception  Praxis         Pertinent Vitals/Pain Pain Assessment Pain Assessment: Faces Pain Score: 3  Pain Location: R foot Pain Descriptors / Indicators: Aching Pain Intervention(s): Monitored during session      Extremity/Trunk Assessment Upper Extremity Assessment Upper Extremity Assessment: RUE deficits/detail;LUE deficits/detail RUE Deficits / Details: R 3rd digit trigger finger, difficulty with gross grip RUE Sensation: WNL RUE Coordination: WNL LUE Deficits / Details: L 2nd trigger finger. Difficulty with gross grip LUE Sensation: WNL LUE Coordination: WNL           Communication Communication Communication: No apparent difficulties   Cognition Arousal: Alert Behavior During Therapy: WFL for tasks assessed/performed Overall Cognitive Status: Within Functional Limits for tasks assessed                                       General Comments       Exercises     Shoulder Instructions      Home Living Family/patient expects to be discharged to:: Private residence Living Arrangements: Alone Available Help at Discharge: Family Type of Home: House Home Access: Stairs to enter Secretary/administrator of Steps: 2 Entrance Stairs-Rails: None Home Layout: One level     Bathroom Shower/Tub: Runner, broadcasting/film/video: Shower seat   Additional Comments: Pt lives alone, sister lives in town, might have RW, not sure.      Prior Functioning/Environment Prior Level of Function : Independent/Modified Independent             Mobility Comments: ind ADLs Comments: ind        OT Problem List: Decreased strength;Decreased range of motion;Decreased activity tolerance;Impaired balance (sitting and/or standing);Impaired vision/perception;Pain      OT Treatment/Interventions: Self-care/ADL training;Therapeutic exercise;DME and/or AE instruction;Energy conservation;Therapeutic activities;Patient/family education    OT Goals(Current goals can be found in the care plan section) Acute Rehab OT Goals Patient Stated Goal: to return to PLOF OT Goal Formulation: With patient Time For Goal Achievement: 12/24/22 Potential to Achieve Goals: Good  OT  Frequency: Min 1X/week    Co-evaluation              AM-PAC OT "6 Clicks" Daily Activity     Outcome Measure Help from another person eating meals?: None Help from another person taking care of personal grooming?: A Little Help from another person toileting, which includes using toliet, bedpan, or urinal?: A Lot Help from another person bathing (including washing, rinsing, drying)?: A Lot Help from another person to put on and taking off regular upper body clothing?: A Little Help from another person to put on and taking off regular lower body clothing?: A Lot 6 Click Score: 16   End of Session Equipment Utilized During Treatment: Gait belt;Rolling walker (2 wheels) Nurse Communication: Mobility status  Activity Tolerance: Patient tolerated treatment well Patient left: in bed;with call bell/phone within reach  OT Visit Diagnosis: Unsteadiness on feet (R26.81);Other abnormalities of gait and mobility (R26.89);Muscle weakness (generalized) (M62.81);Pain Pain - Right/Left: Right Pain - part of body: Ankle and joints of foot                Time: 1010-1042 OT Time Calculation (min): 32 min Charges:  OT General Charges $OT Visit: 1 Visit OT Evaluation $OT Eval Low Complexity: 1 Low OT Treatments $Self Care/Home Management : 8-22 mins  Shuntay Everetts, OTR/L   [place]  Felipe Drone 12/10/2022, 10:51 AM

## 2022-12-10 NOTE — Evaluation (Signed)
Physical Therapy Evaluation Patient Details Name: Greg Robertson MRN: [id number] DOB: 1951-02-23 Today's Date: 12/10/2022  History of Present Illness  Patient is a 72 y.o. male with right ankle fracture/dislocation status post open reduction internal fixation, hx of R occipital CVA. PMH: Afib, poorly controlled DM1. former smoker, HTN.  Clinical Impression  Pt is presenting below baseline. Prior to hospitalization pt was modified independent with all functional mobility. Currently pt requires Min A to get EOB, Min -Mod A for sit to stand/transfers and Min A for short distance gait. Pt has difficulty  maintaining WB precaution for RLE with short distance gait. Due to pt current functional status, home set up and available assistance at home recommending skilled physical therapy services 5x/weekly at <3 hours/day in order to address strength, balance and overall functional mobility to decrease risk for falls, injury, immobility and re-hospitalization to protect the operative site. Pt tolerated treatment session well.       If plan is discharge home, recommend the following: A little help with walking and/or transfers;Assist for transportation;Help with stairs or ramp for entrance;Assistance with cooking/housework   Can travel by private vehicle   No    Equipment Recommendations Other (comment) (defer to post acute)  Recommendations for Other Services       Functional Status Assessment Patient has had a recent decline in their functional status and demonstrates the ability to make significant improvements in function in a reasonable and predictable amount of time.     Precautions / Restrictions Precautions Precautions: Fall Restrictions Weight Bearing Restrictions: Yes RLE Weight Bearing: Non weight bearing      Mobility  Bed Mobility Overal bed mobility: Needs Assistance Bed Mobility: Supine to Sit     Supine to sit: Min assist     General bed mobility comments: Min A  scooting to EOB, increased time to perform with HOB elevated and use of bed rails.    Transfers Overall transfer level: Needs assistance Equipment used: Rolling walker (2 wheels) Transfers: Sit to/from Stand, Bed to chair/wheelchair/BSC Sit to Stand: Min assist, From elevated surface, Mod assist   Step pivot transfers: Min assist       General transfer comment: Min A for momentum to get to standing due to weakness in the hip extensors with verbal cues for maintaining WB precaution. pt requires Min A for stability with sit to stand from elevated EOB and Mod a from lower recliner with verbal cues for safe hand placement for sitting/standing.    Ambulation/Gait Ambulation/Gait assistance: Min assist Gait Distance (Feet): 20 Feet Assistive device: Rolling walker (2 wheels) Gait Pattern/deviations: Step-to pattern Gait velocity: Significantly decreased Gait velocity interpretation: <1.31 ft/sec, indicative of household ambulator   General Gait Details: Hop to pattern with RW intermittent difficulty maintaining WB precaution for 20 ft due to weakness and impaired balance.      Balance Overall balance assessment: Needs assistance Sitting-balance support: Feet supported Sitting balance-Leahy Scale: Good     Standing balance support: During functional activity, Reliant on assistive device for balance, Bilateral upper extremity supported Standing balance-Leahy Scale: Poor Standing balance comment: RLE NWB, cannot tolerate challenge, reliant on RW           Pertinent Vitals/Pain Pain Assessment Pain Assessment: 0-10 Pain Score: 2  Pain Location: R ankle Pain Descriptors / Indicators: Aching Pain Intervention(s): Monitored during session, Limited activity within patient's tolerance    Home Living Family/patient expects to be discharged to:: Private residence Living Arrangements: Alone Available Help at Discharge: Family;Friend(s) (  brother lives in [place] and another sister lives  in [place]. pt states he has a sister in [place] who probably cannot help a whole lot.) Type of Home: House Home Access: Stairs to enter Entrance Stairs-Rails: None Entrance Stairs-Number of Steps: 2   Home Layout: One level Home Equipment: Shower seat Additional Comments: Pt lives alone, sister lives in town, might have RW, not sure.    Prior Function Prior Level of Function : Independent/Modified Independent             Mobility Comments: ind ADLs Comments: ind, does not drive since CVA friends and PCA comes 2x/week to help with various activities     Extremity/Trunk Assessment   Upper Extremity Assessment Upper Extremity Assessment: Defer to OT evaluation RUE Deficits / Details: R 3rd digit trigger finger, difficulty with gross grip RUE Sensation: WNL RUE Coordination: WNL LUE Deficits / Details: L 2nd trigger finger. Difficulty with gross grip LUE Sensation: WNL LUE Coordination: WNL    Lower Extremity Assessment Lower Extremity Assessment: RLE deficits/detail RLE Deficits / Details: recent ankle fracture with ORIF    Cervical / Trunk Assessment Cervical / Trunk Assessment: Normal  Communication   Communication Communication: No apparent difficulties Cueing Techniques: Verbal cues;Tactile cues;Visual cues  Cognition Arousal: Alert Behavior During Therapy: WFL for tasks assessed/performed Overall Cognitive Status: Within Functional Limits for tasks assessed               Assessment/Plan    PT Assessment Patient needs continued PT services  PT Problem List Decreased strength;Decreased mobility;Decreased safety awareness;Decreased balance;Pain       PT Treatment Interventions DME instruction;Therapeutic exercise;Gait training;Balance training;Stair training;Neuromuscular re-education;Functional mobility training;Therapeutic activities;Patient/family education    PT Goals (Current goals can be found in the Care Plan section)  Acute Rehab PT Goals Patient  Stated Goal: Imrove strength to return home alone PT Goal Formulation: With patient Time For Goal Achievement: 12/24/22 Potential to Achieve Goals: Good    Frequency Min 1X/week        AM-PAC PT "6 Clicks" Mobility  Outcome Measure Help needed turning from your back to your side while in a flat bed without using bedrails?: None Help needed moving from lying on your back to sitting on the side of a flat bed without using bedrails?: A Little Help needed moving to and from a bed to a chair (including a wheelchair)?: A Little Help needed standing up from a chair using your arms (e.g., wheelchair or bedside chair)?: A Lot Help needed to walk in hospital room?: A Little Help needed climbing 3-5 steps with a railing? : Total 6 Click Score: 16    End of Session Equipment Utilized During Treatment: Gait belt Activity Tolerance: Patient tolerated treatment well Patient left: in chair;with call bell/phone within reach;with chair alarm set Nurse Communication: Mobility status PT Visit Diagnosis: Other abnormalities of gait and mobility (R26.89);History of falling (Z91.81)    Time: 1132-1206 PT Time Calculation (min) (ACUTE ONLY): 34 min   Charges:   PT Evaluation $PT Eval Low Complexity: 1 Low PT Treatments $Therapeutic Activity: 8-22 mins PT General Charges $$ ACUTE PT VISIT: 1 Visit         Harrel Carina, DPT, CLT  Acute Rehabilitation Services Office: [phone] (Secure chat preferred)   Claudia Desanctis 12/10/2022, 12:16 PM

## 2022-12-10 NOTE — Progress Notes (Signed)
Orthopedic Surgery Progress Note   Assessment: Patient is a 72 y.o. male with right ankle fracture/dislocation status post open reduction internal fixation   Plan: -Operative plans: complete -Diet: diabetic -DVT ppx: can resume home xarelto -Antibiotics: ancef x2 post-op doses -Weight bearing status: non weight bearing right lower extremity in splint (can knee weight bear on that side) -PT evaluate and treat -Pain control -Dispo: per primary  ___________________________________________________________________________  Subjective: No acute events overnight. Pain well controlled with Norco. No complaints this morning.    Physical Exam:  General: no acute distress, appears stated age Neurologic: alert, answering questions appropriately, following commands Respiratory: unlabored breathing on room air, symmetric chest rise Psychiatric: appropriate affect, normal cadence to speech  MSK:   -Right lower extremity  Splint in place and is intact with no drainage on it Plantarflexes and dorsiflexes toes Sensation intact to light touch on dorsal and plantar aspect of toes Foot warm and well perfused   Patient name: Greg Robertson Patient MRN: [id number] Date: 12/10/22

## 2022-12-10 NOTE — Progress Notes (Signed)
PROGRESS NOTE    Greg Robertson  DGL:[id number] DOB: 1950-10-19 DOA: 12/07/2022 PCP: Dettinger, Elige Radon, MD   Brief Narrative:  Greg Robertson is a 72 y.o. male with a known history of atrial fibrillation on Xarelto, CHF (EF 55 to 60% in March 2024), diabetes, hypertension, CKD2, CVA, colon cancer being admitted to our facility for trimalleolar ankle fracture on the right.  Hospitalist called for admission, orthopedics called in consult.  Assessment & Plan:   Principal Problem:   Closed fracture dislocation of right ankle joint  Acute symptomatic hypoglycemia, chronic Transient altered mental status Brittle diabetes, type I, uncontrolled with hyperglycemia -A1c 9.2 -Patient reports feeling hypoglycemic at home just prior to mechanical fall as below.  Likely etiology for his near syncope given his report and consistent symptoms. -Recently evaluated in cardiology office with Dr Shirlee Latch -no indication for consult or follow-up at this point given symptoms are consistent with hypoglycemia -Continue sliding scale insulin and low-dose long-acting insulin(10u) at this time, will attempt to transition patient back to his home medications at discharge -he may benefit from lower insulin regimen given his hypoglycemia as well as continuous blood glucose monitor  Trimalleolar fracture and dislocation of the right ankle Acute ambulatory dysfunction -Ortho following, status post ORIF, tolerated well -Pain currently well-controlled -titrate off IV narcotics in the next 24 hours if no further use of morphine continue p.o. hydrocodone -Nonweightbearing per Ortho, PT following -likely disposition SNF given his ambulatory dysfunction  CKD2, near baseline -Continue fluids perioperatively, creatinine stable at baseline   History of atrial fibrillation, rate controlled -Currently in sinus rhythm -Continue carvedilol -Hold Xarelto  History of CHF -minimally depressed EF at 45-50% - Continue  Lasix, Coreg, Entresto, Lipitor -Echo completed 8/22 -EF 45-50% with global hypokinesis   DVT prophylaxis: SCDs Start: 12/08/22 0130 rivaroxaban (XARELTO) tablet 20 mg   Code Status:   Code Status: Full Code  Family Communication: None present  Status is: Inpatient  Dispo: The patient is from: Home              Anticipated d/c is to: SNF              Anticipated d/c date is: 48 to 72 hours              Patient currently not medically stable for discharge given need for ongoing evaluation, surgical intervention  Consultants:  Ortho  Procedures:  ORIF 12/09/2022  Antimicrobials:  Cefazolin perioperatively  Subjective: No acute issues or events overnight, denies headache fever chills chest pain shortness of breath nausea vomiting diarrhea or constipation  Objective: Vitals:   12/09/22 1450 12/09/22 2135 12/10/22 0530 12/10/22 0745  BP: (!) 116/54 (!) 115/54 133/67 139/69  Pulse: 84 82 (!) 54 (!) 107  Resp: 17 19 17 18   Temp: 97.9 F (36.6 C) 98.3 F (36.8 C) 99.7 F (37.6 C) 98.8 F (37.1 C)  TempSrc:  Oral Oral   SpO2: 94% 94% 91% 91%  Weight:      Height:        Intake/Output Summary (Last 24 hours) at 12/10/2022 0754 Last data filed at 12/10/2022 0743 Gross per 24 hour  Intake 720 ml  Output 1850 ml  Net -1130 ml   Filed Weights   12/08/22 0055  Weight: 85.5 kg    Examination:  General:  Pleasantly resting in bed, No acute distress. HEENT:  Normocephalic atraumatic.  Sclerae nonicteric, noninjected.  Extraocular movements intact bilaterally. Neck:  Without mass or deformity.  Trachea  is midline. Lungs:  Clear to auscultate bilaterally without rhonchi, wheeze, or rales. Heart:  Regular rate and rhythm.  Without murmurs, rubs, or gallops. Abdomen:  Soft, nontender, nondistended.  Without guarding or rebound. Extremities: Without cyanosis, clubbing, edema, or obvious deformity. Right ankle bandage clean dry intact Skin:  Warm and dry, no erythema.  Data  Reviewed: I have personally reviewed following labs and imaging studies  CBC: Recent Labs  Lab 12/07/22 2343 12/08/22 1055 12/09/22 0636 12/10/22 0539  WBC 12.7* 8.1 9.6 9.5  NEUTROABS 10.1*  --   --   --   HGB 13.9 12.4* 10.8* 11.1*  HCT 40.6 35.9* 32.5* 33.1*  MCV 88.8 88.2 89.0 90.4  PLT 161 136* 116* 126*   Basic Metabolic Panel: Recent Labs  Lab 12/05/22 1355 12/07/22 2343 12/08/22 1055 12/09/22 0636 12/10/22 0539  NA 135 140 134* 135 135  K 4.1 4.0 3.8 3.5 3.8  CL 99 108 104 105 101  CO2 27 26 22 23  18*  GLUCOSE 368* 112* 343* 210* 223*  BUN 17 20 15 16 21   CREATININE 1.21 1.41* 1.37* 1.39* 1.67*  CALCIUM 8.5* 8.9 8.2* 8.0* 8.3*   GFR: Estimated Creatinine Clearance: 45.9 mL/min (A) (by C-G formula based on SCr of 1.67 mg/dL (H)).  Scheduled Meds:  ascorbic acid  500 mg Oral Daily   atorvastatin  40 mg Oral Daily   carvedilol  12.5 mg Oral QPC breakfast   carvedilol  6.25 mg Oral Q supper   cetirizine  10 mg Oral Daily   cholecalciferol  4,000 Units Oral Daily   furosemide  40 mg Oral Q1500   insulin aspart  0-5 Units Subcutaneous QHS   insulin aspart  0-9 Units Subcutaneous TID WC   multivitamin with minerals  1 tablet Oral Daily   rivaroxaban  20 mg Oral Q supper   sacubitril-valsartan  1 tablet Oral BID   sodium chloride flush  3 mL Intravenous Q12H   sodium zirconium cyclosilicate  10 g Oral Daily   Tdap  0.5 mL Intramuscular Once   Continuous Infusions:  lactated ringers 10 mL/hr at 12/08/22 0600     LOS: 2 days   Time spent:  Azucena Fallen, DO [facility]  If 7PM-7AM, please contact night-coverage [email or web address]  12/10/2022, 7:54 AM

## 2022-12-11 DIAGNOSIS — S82891A Other fracture of right lower leg, initial encounter for closed fracture: Secondary | ICD-10-CM | POA: Diagnosis not present

## 2022-12-11 DIAGNOSIS — E109 Type 1 diabetes mellitus without complications: Secondary | ICD-10-CM | POA: Diagnosis not present

## 2022-12-11 LAB — GLUCOSE, CAPILLARY
Glucose-Capillary: 364 mg/dL — ABNORMAL HIGH (ref 70–99)
Glucose-Capillary: 320 mg/dL — ABNORMAL HIGH (ref 70–99)
Glucose-Capillary: 261 mg/dL — ABNORMAL HIGH (ref 70–99)
Glucose-Capillary: 367 mg/dL — ABNORMAL HIGH (ref 70–99)
Glucose-Capillary: 337 mg/dL — ABNORMAL HIGH (ref 70–99)

## 2022-12-11 MED ORDER — INSULIN GLARGINE-YFGN 100 UNIT/ML ~~LOC~~ SOLN
15.0000 [IU] | Freq: Every day | SUBCUTANEOUS | Status: DC
  Administered 2022-12-12: 15 [IU] via SUBCUTANEOUS
  Filled 2022-12-11 (×2): qty 0.15

## 2022-12-11 NOTE — Progress Notes (Signed)
PROGRESS NOTE    Greg Robertson  JXB:[id number] DOB: 10-25-1950 DOA: 12/07/2022 PCP: Dettinger, Elige Radon, MD   Brief Narrative:  Greg Robertson is a 72 y.o. male with a known history of atrial fibrillation on Xarelto, CHF (EF 55 to 60% in March 2024), diabetes, hypertension, CKD2, CVA, colon cancer being admitted to our facility for trimalleolar ankle fracture on the right.  Hospitalist called for admission, orthopedics called in consult.  Assessment & Plan:   Principal Problem:   Closed fracture dislocation of right ankle joint  Acute symptomatic hypoglycemia, chronic Transient altered mental status Brittle diabetes, type I, uncontrolled with hyperglycemia -A1c 9.2 -Patient reports feeling hypoglycemic at home just prior to mechanical fall as below.  Likely etiology for his near syncope given his report and consistent symptoms. -Recently evaluated in cardiology office with Dr Shirlee Latch -no indication for consult or follow-up at this point given symptoms are consistent with hypoglycemia -Continue sliding scale insulin and titrate long-acting insulin(15u) at this time given ongoing hyperglycemia, will attempt to transition patient back to his home medications at discharge -he may benefit from lower insulin regimen given his hypoglycemia as well as continuous blood glucose monitor  Trimalleolar fracture and dislocation of the right ankle Acute ambulatory dysfunction -Ortho following, status post ORIF, tolerated well -Pain currently well-controlled -titrate off IV narcotics in the next 24 hours if no further use of morphine continue p.o. hydrocodone -Nonweightbearing per Ortho, PT following -likely disposition SNF given his ambulatory dysfunction  CKD2, near baseline -Continue fluids perioperatively, creatinine stable at baseline   History of atrial fibrillation, rate controlled -Currently in sinus rhythm -Continue carvedilol -Hold Xarelto  History of CHF -minimally depressed EF  at 45-50% - Continue Lasix, Coreg, Entresto, Lipitor -Echo completed 8/22 -EF 45-50% with global hypokinesis   DVT prophylaxis: SCDs Start: 12/08/22 0130 rivaroxaban (XARELTO) tablet 20 mg   Code Status:   Code Status: Full Code  Family Communication: None present  Status is: Inpatient  Dispo: The patient is from: Home              Anticipated d/c is to: SNF              Anticipated d/c date is: 24-48 hours              Patient currently not medically stable for discharge given need for ongoing evaluation, surgical intervention  Consultants:  Ortho  Procedures:  ORIF 12/09/2022  Antimicrobials:  Cefazolin perioperatively  Subjective: No acute issues or events overnight, denies headache fever chills chest pain shortness of breath nausea vomiting diarrhea or constipation  Objective: Vitals:   12/10/22 1659 12/10/22 2033 12/11/22 0057 12/11/22 0738  BP: (!) 146/69 124/61 (!) 152/67 (!) 114/55  Pulse: 94 87 89 87  Resp: 18 18 18 18   Temp: 98 F (36.7 C) 99.2 F (37.3 C) 99.1 F (37.3 C) 99.1 F (37.3 C)  TempSrc:  Oral    SpO2: 94% 97% 95% 97%  Weight:      Height:        Intake/Output Summary (Last 24 hours) at 12/11/2022 0812 Last data filed at 12/11/2022 0655 Gross per 24 hour  Intake 480 ml  Output 2400 ml  Net -1920 ml   Filed Weights   12/08/22 0055  Weight: 85.5 kg    Examination:  General:  Pleasantly resting in bed, No acute distress. HEENT:  Normocephalic atraumatic.  Sclerae nonicteric, noninjected.  Extraocular movements intact bilaterally. Neck:  Without mass or deformity.  Trachea  is midline. Lungs:  Clear to auscultate bilaterally without rhonchi, wheeze, or rales. Heart:  Regular rate and rhythm.  Without murmurs, rubs, or gallops. Abdomen:  Soft, nontender, nondistended.  Without guarding or rebound. Extremities: Without cyanosis, clubbing, edema, or obvious deformity. Right ankle bandage clean dry intact Skin:  Warm and dry, no  erythema.  Data Reviewed: I have personally reviewed following labs and imaging studies  CBC: Recent Labs  Lab 12/07/22 2343 12/08/22 1055 12/09/22 0636 12/10/22 0539  WBC 12.7* 8.1 9.6 9.5  NEUTROABS 10.1*  --   --   --   HGB 13.9 12.4* 10.8* 11.1*  HCT 40.6 35.9* 32.5* 33.1*  MCV 88.8 88.2 89.0 90.4  PLT 161 136* 116* 126*   Basic Metabolic Panel: Recent Labs  Lab 12/05/22 1355 12/07/22 2343 12/08/22 1055 12/09/22 0636 12/10/22 0539  NA 135 140 134* 135 135  K 4.1 4.0 3.8 3.5 3.8  CL 99 108 104 105 101  CO2 27 26 22 23  18*  GLUCOSE 368* 112* 343* 210* 223*  BUN 17 20 15 16 21   CREATININE 1.21 1.41* 1.37* 1.39* 1.67*  CALCIUM 8.5* 8.9 8.2* 8.0* 8.3*   GFR: Estimated Creatinine Clearance: 45.9 mL/min (A) (by C-G formula based on SCr of 1.67 mg/dL (H)).  Scheduled Meds:  ascorbic acid  500 mg Oral Daily   atorvastatin  40 mg Oral Daily   carvedilol  12.5 mg Oral QPC breakfast   carvedilol  6.25 mg Oral Q supper   cetirizine  10 mg Oral Daily   cholecalciferol  4,000 Units Oral Daily   furosemide  40 mg Oral Q1500   insulin aspart  0-5 Units Subcutaneous QHS   insulin aspart  0-9 Units Subcutaneous TID WC   insulin glargine-yfgn  10 Units Subcutaneous Daily   multivitamin with minerals  1 tablet Oral Daily   Netarsudil-Latanoprost  1 drop Ophthalmic QHS   rivaroxaban  20 mg Oral Q supper   sacubitril-valsartan  1 tablet Oral BID   sodium chloride flush  3 mL Intravenous Q12H   sodium zirconium cyclosilicate  10 g Oral Daily   Tdap  0.5 mL Intramuscular Once   Continuous Infusions:  lactated ringers 10 mL/hr at 12/08/22 0600     LOS: 3 days   Time spent:  Azucena Fallen, DO [facility]  If 7PM-7AM, please contact night-coverage [email or web address]  12/11/2022, 8:12 AM

## 2022-12-12 DIAGNOSIS — S82891A Other fracture of right lower leg, initial encounter for closed fracture: Secondary | ICD-10-CM | POA: Diagnosis not present

## 2022-12-12 DIAGNOSIS — E109 Type 1 diabetes mellitus without complications: Secondary | ICD-10-CM | POA: Diagnosis not present

## 2022-12-12 LAB — BASIC METABOLIC PANEL
Anion gap: 4 — ABNORMAL LOW (ref 5–15)
BUN: 21 mg/dL (ref 8–23)
CO2: 30 mmol/L (ref 22–32)
Calcium: 8.3 mg/dL — ABNORMAL LOW (ref 8.9–10.3)
Chloride: 99 mmol/L (ref 98–111)
Creatinine, Ser: 1.47 mg/dL — ABNORMAL HIGH (ref 0.61–1.24)
GFR, Estimated: 51 mL/min — ABNORMAL LOW (ref >60)
Glucose, Bld: 327 mg/dL — ABNORMAL HIGH (ref 70–99)
Potassium: 3.7 mmol/L (ref 3.5–5.1)
Sodium: 133 mmol/L — ABNORMAL LOW (ref 135–145)

## 2022-12-12 LAB — CBC
HCT: 30.7 % — ABNORMAL LOW (ref 39.0–52.0)
Hemoglobin: 10.4 g/dL — ABNORMAL LOW (ref 13.0–17.0)
MCH: 29.2 pg (ref 26.0–34.0)
MCHC: 33.9 g/dL (ref 30.0–36.0)
MCV: 86.2 fL (ref 80.0–100.0)
Platelets: 156 10*3/uL (ref 150–400)
RBC: 3.56 MIL/uL — ABNORMAL LOW (ref 4.22–5.81)
RDW: 12.4 % (ref 11.5–15.5)
WBC: 6.9 10*3/uL (ref 4.0–10.5)
nRBC: 0 % (ref 0.0–0.2)

## 2022-12-12 LAB — GLUCOSE, CAPILLARY
Glucose-Capillary: 302 mg/dL — ABNORMAL HIGH (ref 70–99)
Glucose-Capillary: 419 mg/dL — ABNORMAL HIGH (ref 70–99)
Glucose-Capillary: 390 mg/dL — ABNORMAL HIGH (ref 70–99)
Glucose-Capillary: 137 mg/dL — ABNORMAL HIGH (ref 70–99)
Glucose-Capillary: 86 mg/dL (ref 70–99)
Glucose-Capillary: 88 mg/dL (ref 70–99)

## 2022-12-12 LAB — GLUCOSE, RANDOM: Glucose, Bld: 471 mg/dL — ABNORMAL HIGH (ref 70–99)

## 2022-12-12 MED ORDER — INSULIN ASPART 100 UNIT/ML IJ SOLN
15.0000 [IU] | Freq: Once | INTRAMUSCULAR | Status: AC
  Administered 2022-12-12: 15 [IU] via SUBCUTANEOUS

## 2022-12-12 MED ORDER — INSULIN ASPART 100 UNIT/ML IJ SOLN
0.0000 [IU] | Freq: Three times a day (TID) | INTRAMUSCULAR | Status: DC
  Administered 2022-12-12: 2 [IU] via SUBCUTANEOUS
  Administered 2022-12-12 – 2022-12-13 (×2): 15 [IU] via SUBCUTANEOUS

## 2022-12-12 NOTE — Care Management Important Message (Signed)
Important Message  Patient Details  Name: WESLY COUNIHAN MRN: [id number] Date of Birth: 1950-07-13   Medicare Important Message Given:  Yes     Sherilyn Banker 12/12/2022, 12:48 PM

## 2022-12-12 NOTE — Progress Notes (Signed)
This Clinical research associate has been informed of pt's cbg at 419, Attending MD made aware and received  an order  to cover for 15units, and rechecked @1307  cbg  down to 390, and an additional 15u of insuli given as ordered,( see MAR) Pt has no complaints. Denies any discomfort at this time,

## 2022-12-12 NOTE — Inpatient Diabetes Management (Signed)
Inpatient Diabetes Program Recommendations  AACE/ADA: New Consensus Statement on Inpatient Glycemic Control (2015)  Target Ranges:  Prepandial:   less than 140 mg/dL      Peak postprandial:   less than 180 mg/dL (1-2 hours)      Critically ill patients:  140 - 180 mg/dL   Lab Results  Component Value Date   GLUCAP 390 (H) 12/12/2022   HGBA1C 9.2 (H) 12/08/2022    Latest Reference Range & Units 12/12/22 07:14 12/12/22 11:18 12/12/22 13:07  Glucose-Capillary 70 - 99 mg/dL 161 (H) 096 (H) 045 (H)  (H): Data is abnormally high  Diabetes history: DM 2 Outpatient Diabetes medications: Tresiba 20 units, Novolog 20-30 units tid (1 units for every 4 grams of carbs for breakfast and dinner, 1 unit for every 5 grams of carbs at lunchtime) Current orders for Inpatient glycemic control:  Semglee 15 qday Novolog 0-15 units tid, 0-5 units hs   Inpatient Diabetes Program Recommendations:   Noted Semglee increased to 15 units Novolog correction 15 units given 1214 Novolog correction 15 units given  1319  Please consider: -Increase Semglee to 20 units qday -Add Novolog 8 units tid meal coverage if eats 50% meals  Thank you, Greg Robertson E. Hoorain Kozakiewicz, RN, MSN, CDE  Diabetes Coordinator Inpatient Glycemic Control Team Team Pager [phone] (8am-5pm) 12/12/2022 1:32 PM

## 2022-12-12 NOTE — Progress Notes (Signed)
Physical Therapy Treatment Patient Details Name: Greg Robertson MRN: [id number] DOB: 06/30/1950 Today's Date: 12/12/2022   History of Present Illness Patient is a 72 y.o. male with right ankle fracture/dislocation status post open reduction internal fixation, hx of R occipital CVA. PMH: Afib, poorly controlled DM1. former smoker, HTN.    PT Comments  Pt tolerated treatment well today. Pt was able to ambulate to bathroom with RW CGA however requires constant cues for WB precautions. No change in DC/DME recs at this time. PT will continue to follow.    If plan is discharge home, recommend the following: A little help with walking and/or transfers;Assist for transportation;Help with stairs or ramp for entrance;Assistance with cooking/housework   Can travel by private vehicle     No  Equipment Recommendations  Other (comment) (Per accepting facility)    Recommendations for Other Services       Precautions / Restrictions Precautions Precautions: Fall Restrictions Weight Bearing Restrictions: Yes RLE Weight Bearing: Non weight bearing     Mobility  Bed Mobility Overal bed mobility: Needs Assistance Bed Mobility: Supine to Sit     Supine to sit: Supervision, Used rails     General bed mobility comments: Increased time and use of rails    Transfers Overall transfer level: Needs assistance Equipment used: Rolling walker (2 wheels) Transfers: Sit to/from Stand Sit to Stand: Contact guard assist           General transfer comment: Cues for WB precautions and hand placement    Ambulation/Gait Ambulation/Gait assistance: Contact guard assist Gait Distance (Feet): 20 Feet Assistive device: Rolling walker (2 wheels) Gait Pattern/deviations: Step-to pattern Gait velocity: Significantly decreased     General Gait Details: Hop to pattern with RW intermittent difficulty maintaining WB precaution for 20 ft due to weakness and impaired balance. (Similar to previous  session)   Stairs             Wheelchair Mobility     Tilt Bed    Modified Rankin (Stroke Patients Only)       Balance Overall balance assessment: Needs assistance Sitting-balance support: Feet supported Sitting balance-Leahy Scale: Good     Standing balance support: During functional activity, Reliant on assistive device for balance, Bilateral upper extremity supported Standing balance-Leahy Scale: Poor Standing balance comment: RLE NWB, cannot tolerate challenge, reliant on RW                            Cognition Arousal: Alert Behavior During Therapy: WFL for tasks assessed/performed Overall Cognitive Status: Within Functional Limits for tasks assessed                                          Exercises      General Comments General comments (skin integrity, edema, etc.): VSS      Pertinent Vitals/Pain Pain Assessment Pain Assessment: No/denies pain    Home Living                          Prior Function            PT Goals (current goals can now be found in the care plan section) Progress towards PT goals: Progressing toward goals    Frequency    Min 1X/week      PT Plan  Co-evaluation              AM-PAC PT "6 Clicks" Mobility   Outcome Measure  Help needed turning from your back to your side while in a flat bed without using bedrails?: None Help needed moving from lying on your back to sitting on the side of a flat bed without using bedrails?: A Little Help needed moving to and from a bed to a chair (including a wheelchair)?: A Little Help needed standing up from a chair using your arms (e.g., wheelchair or bedside chair)?: A Lot Help needed to walk in hospital room?: A Little Help needed climbing 3-5 steps with a railing? : Total 6 Click Score: 16    End of Session Equipment Utilized During Treatment: Gait belt Activity Tolerance: Patient tolerated treatment well Patient left: in  chair;with call bell/phone within reach;with chair alarm set Nurse Communication: Mobility status PT Visit Diagnosis: Other abnormalities of gait and mobility (R26.89);History of falling (Z91.81)     Time: 1610-9604 PT Time Calculation (min) (ACUTE ONLY): 19 min  Charges:    $Gait Training: 8-22 mins PT General Charges $$ ACUTE PT VISIT: 1 Visit                     Shela Nevin, PT, DPT Acute Rehab Services [phone]    Gladys Damme 12/12/2022, 10:49 AM

## 2022-12-12 NOTE — Plan of Care (Signed)
  Problem: Education: Goal: Knowledge of General Education information will improve Description: Including pain rating scale, medication(s)/side effects and non-pharmacologic comfort measures Outcome: Progressing   Problem: Activity: Goal: Risk for activity intolerance will decrease Outcome: Progressing   

## 2022-12-12 NOTE — TOC Initial Note (Signed)
Transition of Care [facility]) - Initial/Assessment Note    Patient Details  Name: Greg Robertson MRN: [id number] Date of Birth: [birth year]  Transition of [facility]) CM/SW Contact:    Lorri Frederick, LCSW Phone Number: 12/12/2022, 1:30 PM  Clinical Narrative:     CSW met with pt and sister Darl Pikes to discuss DC plan.  Permission given to speak with all siblings: sisters Darl Pikes and Larita Fife, brother Donnie.  Pt from [place], lives alone, has [facility] aide help twice a week.  Pt is agreeable to SNF, requesting placement in [place], which is closer to siblings' homes.  Medicare choice document provided.    Referral sent out in hub.                Expected Discharge Plan: Skilled Nursing Facility Barriers to Discharge: Continued Medical Work up, SNF Pending bed offer   Patient Goals and CMS Choice Patient states their goals for this hospitalization and ongoing recovery are:: get back home CMS Medicare.gov Compare Post Acute Care list provided to:: Patient Choice offered to / list presented to : Patient, Sibling (sister Darl Pikes)      Expected Discharge Plan and Services In-house Referral: Clinical Social Work   Post Acute Care Choice: Skilled Nursing Facility Living arrangements for the past 2 months: Single Family Home                                      Prior Living Arrangements/Services Living arrangements for the past 2 months: Single Family Home Lives with:: Spouse Patient language and need for interpreter reviewed:: Yes Do you feel safe going back to the place where you live?: Yes      Need for Family Participation in Patient Care: No (Comment) Care giver support system in place?: Yes (comment) Current home services: Homehealth aide (2x week) Criminal Activity/Legal Involvement Pertinent to Current Situation/Hospitalization: No - Comment as needed  Activities of Daily Living Home Assistive Devices/Equipment: Blood pressure cuff, CBG Meter, Dentures (specify type),  Eyeglasses, Hand-held shower hose, Long-handled sponge, Scales, Shower chair with back, Wheelchair ADL Screening (condition at time of admission) Patient's cognitive ability adequate to safely complete daily activities?: Yes Is the patient deaf or have difficulty hearing?: No Does the patient have difficulty seeing, even when wearing glasses/contacts?: Yes Does the patient have difficulty concentrating, remembering, or making decisions?: No Patient able to express need for assistance with ADLs?: Yes Does the patient have difficulty dressing or bathing?: No Independently performs ADLs?: Yes (appropriate for developmental age) Does the patient have difficulty walking or climbing stairs?: No Weakness of Legs: None Weakness of Arms/Hands: None  Permission Sought/Granted Permission sought to share information with : Family Supports Permission granted to share information with : Yes, Verbal Permission Granted  Share Information with NAME: sisters Darl Pikes and Larita Fife, brother [employer] fast streamer granted to share info w AGENCY: SNF        Emotional Assessment Appearance:: Appears stated age Attitude/Demeanor/Rapport: Engaged Affect (typically observed): Appropriate, Pleasant Orientation: : Oriented to Self, Oriented to Place, Oriented to  Time, Oriented to Situation      Admission diagnosis:  Type 1 diabetes mellitus with hypoglycemia and without coma (HCC) [E10.649] Closed right ankle fracture [S82.891A] Closed fracture dislocation of right ankle, initial encounter [S82.891A] Patient Active Problem List   Diagnosis Date Noted   Closed fracture dislocation of right ankle joint 12/08/2022   Change in bowel habits 10/29/2022  Chronic anticoagulation 10/29/2022   History of colectomy 10/29/2022   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   Mixed hyperlipidemia 08/22/2022   Chronic insomnia 11/24/2021   Retinopathy 11/24/2021   Central retinal vein occlusion, right eye, stable 10/25/2021   History of CVA  (cerebrovascular accident) 09/27/2020   A-fib (HCC) 09/27/2020   Left homonymous hemianopsia 09/27/2020   COPD (chronic obstructive pulmonary disease) (HCC) 09/05/2017   Aortic atherosclerosis (HCC) 09/04/2017   Benign prostatic hyperplasia 09/04/2017   Hypertension, essential 03/14/2012   Chronic CHF (congestive heart failure) (HCC) 03/12/2012   Lynch syndrome 07/28/2011   History of colon cancer 05/25/2011   DM type 1 (diabetes mellitus, type 1) (HCC) 05/10/2011   Glaucoma 05/10/2011   PCP:  Dettinger, Elige Radon, MD Pharmacy:   [facility] [place], [place] - [address] [place] [address] Phone: [phone] Fax: [phone]     Social Determinants of Health (SDOH) Social History: SDOH Screenings   Food Insecurity: No Food Insecurity (12/09/2022)  Housing: Low Risk  (12/09/2022)  Transportation Needs: No Transportation Needs (12/09/2022)  Utilities: Not At Risk (12/09/2022)  Alcohol Screen: Low Risk  (02/23/2022)  Depression (PHQ2-9): Medium Risk (10/27/2022)  Financial Resource Strain: Low Risk  (02/23/2022)  Physical Activity: Sufficiently Active (02/23/2022)  Social Connections: Moderately Isolated (02/23/2022)  Stress: No Stress Concern Present (02/23/2022)  Tobacco Use: Medium Risk (12/09/2022)   SDOH Interventions:     Readmission Risk Interventions     No data to display

## 2022-12-12 NOTE — [place] FL2 (Signed)
[place] MEDICAID FL2 LEVEL OF CARE FORM     IDENTIFICATION  Patient Name: Greg Robertson Birthdate: 09/30/1950 Sex: male Admission Date (Current Location): 12/07/2022  [facility] and [place] Number:  Producer, television/film/video and Address:  [facility], [address] [place], [place] [address]      Provider Number: [id number]  Attending Physician Name and Address:  Azucena Fallen, MD  Relative Name and Phone Number:  Cassandria Santee [phone]    Current Level of Care: Hospital Recommended Level of Care: Skilled Nursing Facility Prior Approval Number:    Date Approved/Denied:   PASRR Number: [id number]  Discharge Plan: SNF    Current Diagnoses: Patient Active Problem List   Diagnosis Date Noted   Closed fracture dislocation of right ankle joint 12/08/2022   Change in bowel habits 10/29/2022   Chronic anticoagulation 10/29/2022   History of colectomy 10/29/2022   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   Mixed hyperlipidemia 08/22/2022   Chronic insomnia 11/24/2021   Retinopathy 11/24/2021   Central retinal vein occlusion, right eye, stable 10/25/2021   History of CVA (cerebrovascular accident) 09/27/2020   A-fib (HCC) 09/27/2020   Left homonymous hemianopsia 09/27/2020   COPD (chronic obstructive pulmonary disease) (HCC) 09/05/2017   Aortic atherosclerosis (HCC) 09/04/2017   Benign prostatic hyperplasia 09/04/2017   Hypertension, essential 03/14/2012   Chronic CHF (congestive heart failure) (HCC) 03/12/2012   Lynch syndrome 07/28/2011   History of colon cancer 05/25/2011   DM type 1 (diabetes mellitus, type 1) (HCC) 05/10/2011   Glaucoma 05/10/2011    Orientation RESPIRATION BLADDER Height & Weight     Self, Time, Situation, Place  Normal Continent Weight: 188 lb 7.9 oz (85.5 kg) Height:  6\' 1"  (185.4 cm)  BEHAVIORAL SYMPTOMS/MOOD NEUROLOGICAL BOWEL NUTRITION STATUS      Continent Diet (see discharge summary)  AMBULATORY STATUS  COMMUNICATION OF NEEDS Skin   Supervision Verbally Surgical wounds                       Personal Care Assistance Level of Assistance  Bathing, Feeding, Dressing Bathing Assistance: Limited assistance Feeding assistance: Independent Dressing Assistance: Limited assistance     Functional Limitations Info  Sight, Hearing, Speech Sight Info: Adequate Hearing Info: Adequate Speech Info: Adequate    SPECIAL CARE FACTORS FREQUENCY  PT (By licensed PT), OT (By licensed OT)     PT Frequency: 5x week OT Frequency: 5x week            Contractures Contractures Info: Not present    Additional Factors Info  Code Status, Allergies, Insulin Sliding Scale Code Status Info: full Allergies Info: oxycodone   Insulin Sliding Scale Info: novolog: see discharge summary       Current Medications (12/12/2022):  This is the current hospital active medication list Current Facility-Administered Medications  Medication Dose Route Frequency Provider Last Rate Last Admin   albuterol (PROVENTIL) (2.5 MG/3ML) 0.083% nebulizer solution 2.5 mg  2.5 mg Nebulization Q6H PRN London Sheer, MD       ascorbic acid (VITAMIN C) tablet 500 mg  500 mg Oral Daily London Sheer, MD   500 mg at 12/12/22 0826   atorvastatin (LIPITOR) tablet 40 mg  40 mg Oral Daily London Sheer, MD   40 mg at 12/12/22 0825   bisacodyl (DULCOLAX) EC tablet 5 mg  5 mg Oral Daily PRN London Sheer, MD       carvedilol (COREG) tablet 12.5  mg  12.5 mg Oral QPC breakfast London Sheer, MD   12.5 mg at 12/12/22 0825   carvedilol (COREG) tablet 6.25 mg  6.25 mg Oral Q supper London Sheer, MD   6.25 mg at 12/11/22 1752   cetirizine (ZYRTEC) tablet 10 mg  10 mg Oral Daily London Sheer, MD   10 mg at 12/12/22 0830   cholecalciferol (VITAMIN D3) 25 MCG (1000 UNIT) tablet 4,000 Units  4,000 Units Oral Daily London Sheer, MD   4,000 Units at 12/12/22 2440   furosemide (LASIX) tablet 40 mg  40 mg Oral Q1500 London Sheer, MD   40 mg at 12/11/22 1752   hydrALAZINE (APRESOLINE) injection 5 mg  5 mg Intravenous Q8H PRN London Sheer, MD       HYDROcodone-acetaminophen (NORCO/VICODIN) 5-325 MG per tablet 1-2 tablet  1-2 tablet Oral Q4H PRN London Sheer, MD   2 tablet at 12/11/22 2055   insulin aspart (novoLOG) injection 0-15 Units  0-15 Units Subcutaneous TID WC Azucena Fallen, MD   15 Units at 12/12/22 1214   insulin aspart (novoLOG) injection 0-5 Units  0-5 Units Subcutaneous QHS London Sheer, MD   4 Units at 12/11/22 2225   insulin glargine-yfgn (SEMGLEE) injection 15 Units  15 Units Subcutaneous Daily Azucena Fallen, MD   15 Units at 12/12/22 0905   ipratropium (ATROVENT) nebulizer solution 0.5 mg  0.5 mg Nebulization Q6H PRN London Sheer, MD       lactated ringers infusion   Intravenous Continuous London Sheer, MD 10 mL/hr at 12/08/22 0600 Infusion Verify at 12/08/22 0600   multivitamin with minerals tablet 1 tablet  1 tablet Oral Daily London Sheer, MD   1 tablet at 12/12/22 0825   Netarsudil-Latanoprost 0.02-0.005 % SOLN 1 drop  1 drop Ophthalmic QHS Azucena Fallen, MD       ondansetron [facility]) tablet 4 mg  4 mg Oral Q6H PRN London Sheer, MD       Or   ondansetron [facility]) injection 4 mg  4 mg Intravenous Q6H PRN London Sheer, MD       rivaroxaban Carlena Hurl) tablet 20 mg  20 mg Oral Q supper Azucena Fallen, MD   20 mg at 12/11/22 1752   sacubitril-valsartan (ENTRESTO) 24-26 mg per tablet  1 tablet Oral BID London Sheer, MD   1 tablet at 12/12/22 1027   senna-docusate (Senokot-S) tablet 1 tablet  1 tablet Oral QHS PRN London Sheer, MD       sodium chloride flush (NS) 0.9 % injection 3 mL  3 mL Intravenous Q12H London Sheer, MD   3 mL at 12/11/22 0810   sodium zirconium cyclosilicate (LOKELMA) packet 10 g  10 g Oral Daily London Sheer, MD   10 g at 12/11/22 2536   Tdap (BOOSTRIX) injection 0.5 mL  0.5 mL Intramuscular Once London Sheer, MD       traZODone (DESYREL) tablet 25 mg  25 mg Oral QHS PRN London Sheer, MD         Discharge Medications: Please see discharge summary for a list of discharge medications.  Relevant Imaging Results:  Relevant Lab Results:   Additional Information SSN: [id number]  Lorri Frederick, LCSW

## 2022-12-12 NOTE — Progress Notes (Signed)
PROGRESS NOTE    Greg Robertson  DGL:[id number] DOB: [birth year] DOA: 12/07/2022 PCP: Dettinger, Elige Radon, MD   Brief Narrative:  Greg Robertson is a 72 y.o. male with a known history of atrial fibrillation on Xarelto, CHF (EF 55 to 60% in March 2024), diabetes, hypertension, CKD2, CVA, colon cancer being admitted to our facility for trimalleolar ankle fracture on the right.  Hospitalist called for admission, orthopedics called in consult.  Patient tolerated procedure quite well, pending safe disposition will likely discharge patient to SNF given ongoing ambulatory dysfunction.  Assessment & Plan:   Principal Problem:   Closed fracture dislocation of right ankle joint  Acute symptomatic hypoglycemia, chronic Transient altered mental status Brittle diabetes, type I, uncontrolled with hyperglycemia -A1c 9.2 -Patient reports feeling hypoglycemic at home just prior to mechanical fall as below.  Likely etiology for his near syncope given his report and consistent symptoms. -Recently evaluated in cardiology office with Dr Shirlee Latch -no indication for consult or follow-up at this point given symptoms are consistent with hypoglycemia -Continue sliding scale insulin (Increased to moderate) and increase long-acting insulin(15u) at this time given ongoing hyperglycemia, will attempt to transition patient back to his home medications at discharge -he may benefit from lower insulin regimen given his hypoglycemia as well as continuous blood glucose monitor  Trimalleolar fracture and dislocation of the right ankle Acute ambulatory dysfunction -Ortho following, status post ORIF, tolerated well -Pain currently well-controlled -titrate off IV narcotics in the next 24 hours if no further use of morphine continue p.o. hydrocodone -Nonweightbearing per Ortho, PT following -likely disposition SNF given his ambulatory dysfunction  CKD2, near baseline -Continue fluids perioperatively, creatinine stable at  baseline   History of atrial fibrillation, rate controlled -Currently in sinus rhythm -Continue carvedilol -Hold Xarelto  History of CHF -minimally depressed EF at 45-50% - Continue Lasix, Coreg, Entresto, Lipitor -Echo completed 8/22 -EF 45-50% with global hypokinesis   DVT prophylaxis: SCDs Start: 12/08/22 0130 rivaroxaban (XARELTO) tablet 20 mg   Code Status:   Code Status: Full Code  Family Communication: None present  Status is: Inpatient  Dispo: The patient is from: Home              Anticipated d/c is to: SNF              Anticipated d/c date is: 24-48 hours              Patient currently not medically stable for discharge given need for ongoing evaluation, surgical intervention  Consultants:  Ortho  Procedures:  ORIF 12/09/2022  Antimicrobials:  Cefazolin perioperatively  Subjective: No acute issues or events overnight, denies headache fever chills chest pain shortness of breath nausea vomiting diarrhea or constipation  Objective: Vitals:   12/11/22 1506 12/11/22 2051 12/12/22 0536 12/12/22 0716  BP: (!) 142/66 (!) 144/60 137/70 132/71  Pulse: 88 95 80 90  Resp: 18 18 18 17   Temp: 98.6 F (37 C) 99.4 F (37.4 C) 98.1 F (36.7 C) 98.3 F (36.8 C)  TempSrc:  Oral    SpO2: 97% 95% 96% 97%  Weight:      Height:        Intake/Output Summary (Last 24 hours) at 12/12/2022 0739 Last data filed at 12/12/2022 0345 Gross per 24 hour  Intake 480 ml  Output 2450 ml  Net -1970 ml   Filed Weights   12/08/22 0055  Weight: 85.5 kg    Examination:  General:  Pleasantly resting in bed, No acute distress.  HEENT:  Normocephalic atraumatic.  Sclerae nonicteric, noninjected.  Extraocular movements intact bilaterally. Neck:  Without mass or deformity.  Trachea is midline. Lungs:  Clear to auscultate bilaterally without rhonchi, wheeze, or rales. Heart:  Regular rate and rhythm.  Without murmurs, rubs, or gallops. Abdomen:  Soft, nontender, nondistended.  Without  guarding or rebound. Extremities: Without cyanosis, clubbing, edema, or obvious deformity. Right ankle bandage clean dry intact Skin:  Warm and dry, no erythema.  Data Reviewed: I have personally reviewed following labs and imaging studies  CBC: Recent Labs  Lab 12/07/22 2343 12/08/22 1055 12/09/22 0636 12/10/22 0539 12/12/22 0459  WBC 12.7* 8.1 9.6 9.5 6.9  NEUTROABS 10.1*  --   --   --   --   HGB 13.9 12.4* 10.8* 11.1* 10.4*  HCT 40.6 35.9* 32.5* 33.1* 30.7*  MCV 88.8 88.2 89.0 90.4 86.2  PLT 161 136* 116* 126* 156   Basic Metabolic Panel: Recent Labs  Lab 12/07/22 2343 12/08/22 1055 12/09/22 0636 12/10/22 0539 12/12/22 0459  NA 140 134* 135 135 133*  K 4.0 3.8 3.5 3.8 3.7  CL 108 104 105 101 99  CO2 26 22 23  18* 30  GLUCOSE 112* 343* 210* 223* 327*  BUN 20 15 16 21 21   CREATININE 1.41* 1.37* 1.39* 1.67* 1.47*  CALCIUM 8.9 8.2* 8.0* 8.3* 8.3*   GFR: Estimated Creatinine Clearance: 52.1 mL/min (A) (by C-G formula based on SCr of 1.47 mg/dL (H)).  Scheduled Meds:  ascorbic acid  500 mg Oral Daily   atorvastatin  40 mg Oral Daily   carvedilol  12.5 mg Oral QPC breakfast   carvedilol  6.25 mg Oral Q supper   cetirizine  10 mg Oral Daily   cholecalciferol  4,000 Units Oral Daily   furosemide  40 mg Oral Q1500   insulin aspart  0-5 Units Subcutaneous QHS   insulin aspart  0-9 Units Subcutaneous TID WC   insulin glargine-yfgn  15 Units Subcutaneous Daily   multivitamin with minerals  1 tablet Oral Daily   Netarsudil-Latanoprost  1 drop Ophthalmic QHS   rivaroxaban  20 mg Oral Q supper   sacubitril-valsartan  1 tablet Oral BID   sodium chloride flush  3 mL Intravenous Q12H   sodium zirconium cyclosilicate  10 g Oral Daily   Tdap  0.5 mL Intramuscular Once   Continuous Infusions:  lactated ringers 10 mL/hr at 12/08/22 0600     LOS: 4 days   Time spent:  Azucena Fallen, DO [facility]  If 7PM-7AM, please contact  night-coverage [email or web address]  12/12/2022, 7:39 AM

## 2022-12-13 DIAGNOSIS — S82891A Other fracture of right lower leg, initial encounter for closed fracture: Secondary | ICD-10-CM | POA: Diagnosis not present

## 2022-12-13 LAB — GLUCOSE, CAPILLARY
Glucose-Capillary: 372 mg/dL — ABNORMAL HIGH (ref 70–99)
Glucose-Capillary: 450 mg/dL — ABNORMAL HIGH (ref 70–99)
Glucose-Capillary: 417 mg/dL — ABNORMAL HIGH (ref 70–99)
Glucose-Capillary: 420 mg/dL — ABNORMAL HIGH (ref 70–99)
Glucose-Capillary: 303 mg/dL — ABNORMAL HIGH (ref 70–99)
Glucose-Capillary: 207 mg/dL — ABNORMAL HIGH (ref 70–99)

## 2022-12-13 MED ORDER — INSULIN ASPART 100 UNIT/ML IJ SOLN
0.0000 [IU] | Freq: Three times a day (TID) | INTRAMUSCULAR | Status: DC
  Administered 2022-12-13: 7 [IU] via SUBCUTANEOUS

## 2022-12-13 MED ORDER — INSULIN GLARGINE-YFGN 100 UNIT/ML ~~LOC~~ SOLN
25.0000 [IU] | Freq: Every day | SUBCUTANEOUS | Status: DC
  Administered 2022-12-13: 25 [IU] via SUBCUTANEOUS
  Filled 2022-12-13: qty 0.25

## 2022-12-13 MED ORDER — INSULIN ASPART 100 UNIT/ML IJ SOLN
15.0000 [IU] | Freq: Once | INTRAMUSCULAR | Status: AC
  Administered 2022-12-13: 15 [IU] via SUBCUTANEOUS

## 2022-12-13 NOTE — Inpatient Diabetes Management (Signed)
Inpatient Diabetes Program Recommendations  AACE/ADA: New Consensus Statement on Inpatient Glycemic Control (2015)  Target Ranges:  Prepandial:   less than 140 mg/dL      Peak postprandial:   less than 180 mg/dL (1-2 hours)      Critically ill patients:  140 - 180 mg/dL   Lab Results  Component Value Date   GLUCAP 420 (H) 12/13/2022   HGBA1C 9.2 (H) 12/08/2022    Latest Reference Range & Units 12/12/22 07:14 12/12/22 11:18 12/12/22 13:07  Glucose-Capillary 70 - 99 mg/dL 865 (H) 784 (H) 696 (H)  (H): Data is abnormally high  Diabetes history: DM 2 Outpatient Diabetes medications: Tresiba 20 units, Novolog 20-30 units tid (1 units for every 4 grams of carbs for breakfast and dinner, 1 unit for every 5 grams of carbs at lunchtime) Current orders for Inpatient glycemic control:  Semglee 25 qday (increased today) Novolog 0-20 units tid, 0-5 units hs (increased today)  Inpatient Diabetes Program Recommendations:    Note: pt on meal coverage at home. Consider:  -   Adding Novolog 6 units tid meal coverage if eats 50% meals  Thank you, Christena Deem RN, MSN, BC-ADM Inpatient Diabetes Coordinator Team Pager [phone] (8a-5p)

## 2022-12-13 NOTE — Progress Notes (Signed)
   12/13/22 1119  Mobility  Activity Transferred from chair to bed  Level of Assistance Minimal assist, patient does 75% or more  Assistive Device Front wheel walker  RLE Weight Bearing NWB  Activity Response Tolerated fair  Mobility Referral Yes  $Mobility charge 1 Mobility  Mobility Specialist Start Time (ACUTE ONLY) 1058  Mobility Specialist Stop Time (ACUTE ONLY) 1115  Mobility Specialist Time Calculation (min) (ACUTE ONLY) 17 min   Mobility Specialist: Progress Note  Pt agreeable to mobility session - received in chair.  Pt c/o R. ankle pain and L. arm weakness upon standing using RW. Pt has redness on tailbone area from sitting - RN aware. Pt returned to bed with all needs met - call bell within reach. Bed alarm on.   Barnie Mort, BS Mobility Specialist Please contact via SecureChat or Rehab office at [phone].

## 2022-12-13 NOTE — TOC Transition Note (Signed)
Transition of Care [facility]) - CM/SW Discharge Note   Patient Details  Name: Greg Robertson MRN: [id number] Date of Birth: Mar 02, 1951  [facility] CM/SW Contact:  Lorri Frederick, LCSW Phone Number: 12/13/2022, 1:25 PM   Clinical Narrative:   Pt discharging to [facility], room 410B.  RN call report to [phone].      Final next level of care: Skilled Nursing Facility Barriers to Discharge: Barriers Resolved   Patient Goals and CMS Choice CMS Medicare.gov Compare Post Acute Care list provided to:: Patient Choice offered to / list presented to : Patient, Sibling (sister Darl Pikes)  Discharge Placement                Patient chooses bed at: [facility] Patient to be transferred to facility by: [facility] Name of family member notified: sister Darl Pikes in room Patient and family notified of of transfer: 12/13/22  Discharge Plan and Services Additional resources added to the After Visit Summary for   In-house Referral: Clinical Social Work   Post Acute Care Choice: Skilled Nursing Facility                               Social Determinants of Health (SDOH) Interventions SDOH Screenings   Food Insecurity: No Food Insecurity (12/09/2022)  Housing: Low Risk  (12/09/2022)  Transportation Needs: No Transportation Needs (12/09/2022)  Utilities: Not At Risk (12/09/2022)  Alcohol Screen: Low Risk  (02/23/2022)  Depression (PHQ2-9): Medium Risk (10/27/2022)  Financial Resource Strain: Low Risk  (02/23/2022)  Physical Activity: Sufficiently Active (02/23/2022)  Social Connections: Moderately Isolated (02/23/2022)  Stress: No Stress Concern Present (02/23/2022)  Tobacco Use: Medium Risk (12/09/2022)     Readmission Risk Interventions     No data to display

## 2022-12-13 NOTE — Discharge Summary (Signed)
Physician Discharge Summary  NICCOLAS STANSEL VHQ:[id number] DOB: 05/15/1950 DOA: 12/07/2022  PCP: Nils Pyle, MD  Admit date: 12/07/2022 Discharge date: 12/13/2022  Admitted From: Home Disposition: SNF  Recommendations for Outpatient Follow-up:  Follow up with PCP in 1-2 weeks Follow-up with orthopedic surgery as scheduled next 1 to 2 weeks:  Discharge Condition: Stable CODE STATUS: Full Diet recommendation: Low-carb diabetic diet  Brief/Interim Summary: Taten Jaffee is a 72 y.o. male with a known history of atrial fibrillation on Xarelto, CHF (EF 55 to 60% in March 2024), diabetes, hypertension, CKD2, CVA, colon cancer being admitted to our facility for trimalleolar ankle fracture on the right.  Hospitalist called for admission, orthopedics called in consult.   Patient tolerated procedure quite well, glucose continues to be somewhat labile postoperatively, insulin being titrated appropriately.  Medically stable for discharge to SNF, bed available and approved today 8/27.  Recommend close follow-up with PCP and orthopedics in regards to patient's insulin and postoperative care respectively.  Discharge Diagnoses:  Principal Problem:   Closed fracture dislocation of right ankle joint  Trimalleolar fracture and dislocation of the right ankle Acute ambulatory dysfunction -Ortho following, status post ORIF, tolerated well -Pain currently well-controlled -wean off narcotics as tolerated -Nonweightbearing until cleared by orthopedic surgery, follow-up in 1 to 2 weeks per their schedule for further evaluation.  Acute symptomatic hypoglycemia, chronic Transient altered mental status Brittle diabetes, type I, uncontrolled with hyperglycemia -A1c 9.2 -Resume home regimen, close monitoring in setting of previous hypoglycemia -Recommend continuous blood glucose monitor   CKD2, near baseline -Creatinine stable   History of atrial fibrillation, rate controlled -Currently in  sinus rhythm -Continue carvedilol -Continue Xarelto   History of CHF -minimally depressed EF at 45-50% -Continue Lasix, Coreg, Entresto, Lipitor -Echo completed 8/22 -EF 45-50% with global hypokinesis  Discharge Instructions   Allergies as of 12/13/2022       Reactions   Oxycodone Nausea And Vomiting        Medication List     STOP taking these medications    acetaminophen 500 MG tablet Commonly known as: TYLENOL       TAKE these medications    ascorbic acid 500 MG tablet Commonly known as: VITAMIN C Take 500 mg by mouth in the morning. (0800)   atorvastatin 40 MG tablet Commonly known as: LIPITOR TAKE ONE TABLET BY MOUTH ONCE DAILY.   carvedilol 12.5 MG tablet Commonly known as: COREG Take 1 tablet (12.5 mg total) by mouth 2 (two) times daily with a meal. Dose change-updated dispill What changed:  how much to take additional instructions   Cholecalciferol 50 MCG (2000 UT) Tabs Take 2 tablets by mouth daily.   Entresto 24-26 MG Generic drug: sacubitril-valsartan Take 1 tablet by mouth 2 (two) times daily.   furosemide 40 MG tablet Commonly known as: LASIX Take 1 tablet (40 mg total) by mouth daily in the afternoon.   GARLIC PO Take 1 capsule by mouth daily.   glucose blood test strip Use as instructed   HYDROcodone-acetaminophen 5-325 MG tablet Commonly known as: NORCO/VICODIN Take 1-2 tablets by mouth every 4 (four) hours as needed for up to 7 days for moderate pain or severe pain.   Insulin Pen Needle 32G X 4 MM Misc Use as directed   Lancets Misc 1 Lancet by Does not apply route 3 (three) times daily.   levocetirizine 5 MG tablet Commonly known as: XYZAL Take 1 tablet (5 mg total) by mouth daily.   Lokelma 10 g  Pack packet Generic drug: sodium zirconium cyclosilicate Take 10 g by mouth daily. What changed: when to take this   multivitamin with minerals Tabs tablet Take 1 tablet by mouth daily. (0800)   NovoLOG FlexPen 100 UNIT/ML  FlexPen Generic drug: insulin aspart INJECT 20-30 UNITS INTO THE SKIN THREE TIMES DAILY WITH MEALS   polyethylene glycol powder 17 GM/SCOOP powder Commonly known as: GlycoLax Take 17 g by mouth daily for 14 days.   rivaroxaban 20 MG Tabs tablet Commonly known as: Xarelto TAKE ONE TABLET BY MOUTH EVERY EVENING WITH SUPPER   Rocklatan 0.02-0.005 % Soln Generic drug: Netarsudil-Latanoprost Apply 0.5 drops to eye at bedtime.   senna-docusate 8.6-50 MG tablet Commonly known as: Senokot-S Take 1 tablet by mouth 2 (two) times daily for 14 days.   Evaristo Bury FlexTouch 100 UNIT/ML FlexTouch Pen Generic drug: insulin degludec Inject 15-25 Units into the skin at bedtime. What changed:  how much to take when to take this   triamcinolone 55 MCG/ACT Aero nasal inhaler Commonly known as: NASACORT INSTILL 1 SPRAY IN EACH NOSTRIL ONCE OR TWICE DAILY AS NEEDED.        Allergies  Allergen Reactions   Oxycodone Nausea And Vomiting    Consultations: Orthopedic surgery   Procedures/Studies: DG Ankle 2 Views Right  Result Date: 12/09/2022 CLINICAL DATA:  Postoperative status. EXAM: RIGHT ANKLE - 2 VIEW COMPARISON:  Fluoroscopic images of same day. FINDINGS: Right ankle has been casted and immobilized. Patient is status post surgical internal fixation of distal right tibial and fibular fractures. Good alignment of fracture components is noted. IMPRESSION: Status post surgical internal fixation of distal right tibial and fibular fractures. Electronically Signed   By: Lupita Raider M.D.   On: 12/09/2022 09:11   DG Ankle 2 Views Right  Result Date: 12/09/2022 CLINICAL DATA:  Right ankle ORIF, intraoperative examination EXAM: RIGHT ANKLE - 2 VIEW COMPARISON:  None Available. FINDINGS: Three fluoroscopic intraoperative radiographs are presented for interpretation postprocedurally. These images demonstrate bimalleolar right ankle fracture ORIF utilizing a lateral fibular plate, cortical and  single inter fragmentary screw, dual medial malleolar lag screws, and a tight rope reconstruction. Fracture fragments are in near anatomic alignment on terminal images. Ankle mortise appears aligned. No unexpected fracture or dislocation. Fluoroscopic images: 3 Fluoroscopic time: 68.9 seconds Fluoroscopic dose: 2.79 mGy IMPRESSION: 1. Bimalleolar right ankle fracture ORIF. Electronically Signed   By: Helyn Numbers M.D.   On: 12/09/2022 04:35   DG C-Arm 1-60 Min-No Report  Result Date: 12/09/2022 Fluoroscopy was utilized by the requesting physician.  No radiographic interpretation.   DG C-Arm 1-60 Min-No Report  Result Date: 12/09/2022 Fluoroscopy was utilized by the requesting physician.  No radiographic interpretation.   ECHOCARDIOGRAM COMPLETE  Result Date: 12/08/2022    ECHOCARDIOGRAM REPORT   Patient Name:   JACOBUS PRENGER Date of Exam: 12/08/2022 Medical Rec #:  [id number]          Height:       73.0 in Accession #:    [id number]         Weight:       188.5 lb Date of Birth:  02-Mar-1951          BSA:          2.098 m Patient Age:    71 years           BP:           156/69 mmHg Patient Gender: M  HR:           79 bpm. Exam Location:  Inpatient Procedure: 2D Echo, Cardiac Doppler and Color Doppler Indications:    Preoperative evaluation  History:        Patient has prior history of Echocardiogram examinations, most                 recent 07/11/2022. CHF, COPD and Stroke, Arrythmias:Atrial                 Fibrillation; Risk Factors:Hypertension, Diabetes and                 Dyslipidemia.  Sonographer:    Lucendia Herrlich Referring Phys: [id number] ALEXIS HUGELMEYER IMPRESSIONS  1. Left ventricular ejection fraction, by estimation, is 45 to 50%. The left ventricle has mildly decreased function. The left ventricle demonstrates global hypokinesis. Left ventricular diastolic parameters are indeterminate.  2. Right ventricular systolic function is normal. The right ventricular size is normal.  There is normal pulmonary artery systolic pressure.  3. The mitral valve is normal in structure. No evidence of mitral valve regurgitation. No evidence of mitral stenosis.  4. The aortic valve is normal in structure. Aortic valve regurgitation is not visualized. No aortic stenosis is present.  5. The inferior vena cava is normal in size with greater than 50% respiratory variability, suggesting right atrial pressure of 3 mmHg. Comparison(s): Prior images reviewed side by side. Changes from prior study are noted. EF slightly less vigorous than prior, without focal wall motion abnormalities. FINDINGS  Left Ventricle: Left ventricular ejection fraction, by estimation, is 45 to 50%. The left ventricle has mildly decreased function. The left ventricle demonstrates global hypokinesis. The left ventricular internal cavity size was normal in size. There is  no left ventricular hypertrophy. Left ventricular diastolic parameters are indeterminate. Right Ventricle: The right ventricular size is normal. Right vetricular wall thickness was not well visualized. Right ventricular systolic function is normal. There is normal pulmonary artery systolic pressure. The tricuspid regurgitant velocity is 1.99 m/s, and with an assumed right atrial pressure of 3 mmHg, the estimated right ventricular systolic pressure is 18.8 mmHg. Left Atrium: Left atrial size was normal in size. Right Atrium: Right atrial size was normal in size. Pericardium: There is no evidence of pericardial effusion. Mitral Valve: The mitral valve is normal in structure. No evidence of mitral valve regurgitation. No evidence of mitral valve stenosis. Tricuspid Valve: The tricuspid valve is grossly normal. Tricuspid valve regurgitation is trivial. No evidence of tricuspid stenosis. Aortic Valve: The aortic valve is normal in structure. Aortic valve regurgitation is not visualized. No aortic stenosis is present. Aortic valve peak gradient measures 5.2 mmHg. Pulmonic  Valve: The pulmonic valve was not well visualized. Pulmonic valve regurgitation is trivial. No evidence of pulmonic stenosis. Aorta: The aortic root, ascending aorta, aortic arch and descending aorta are all structurally normal, with no evidence of dilitation or obstruction. Venous: The inferior vena cava is normal in size with greater than 50% respiratory variability, suggesting right atrial pressure of 3 mmHg. IAS/Shunts: The atrial septum is grossly normal.  LEFT VENTRICLE PLAX 2D LVIDd:         3.60 cm   Diastology LVIDs:         2.70 cm   LV e' medial:    6.53 cm/s LV PW:         1.10 cm   LV E/e' medial:  9.4 LV IVS:        0.90 cm  LV e' lateral:   6.22 cm/s LVOT diam:     2.40 cm   LV E/e' lateral: 9.9 LV SV:         69 LV SV Index:   33 LVOT Area:     4.52 cm  RIGHT VENTRICLE             IVC RV S prime:     13.10 cm/s  IVC diam: 1.40 cm TAPSE (M-mode): 2.0 cm LEFT ATRIUM             Index        RIGHT ATRIUM           Index LA diam:        2.80 cm 1.33 cm/m   RA Area:     13.40 cm LA Vol (A2C):   27.3 ml 13.01 ml/m  RA Volume:   30.60 ml  14.58 ml/m LA Vol (A4C):   49.1 ml 23.40 ml/m LA Biplane Vol: 36.5 ml 17.40 ml/m  AORTIC VALVE AV Area (Vmax): 3.37 cm AV Vmax:        114.00 cm/s AV Peak Grad:   5.2 mmHg LVOT Vmax:      85.03 cm/s LVOT Vmean:     54.633 cm/s LVOT VTI:       0.152 m  AORTA Ao Root diam: 3.30 cm Ao Asc diam:  3.10 cm MITRAL VALVE               TRICUSPID VALVE MV Area (PHT): 3.03 cm    TR Peak grad:   15.8 mmHg MV Decel Time: 250 msec    TR Vmax:        199.00 cm/s MV E velocity: 61.30 cm/s MV A velocity: 71.10 cm/s  SHUNTS MV E/A ratio:  0.86        Systemic VTI:  0.15 m                            Systemic Diam: 2.40 cm Jodelle Red MD Electronically signed by Jodelle Red MD Signature Date/Time: 12/08/2022/2:28:05 PM    Final    DG Chest Port 1 View  Result Date: 12/08/2022 CLINICAL DATA:  Preop right ankle.  Shortness of breath. EXAM: PORTABLE CHEST 1 VIEW  COMPARISON:  09/29/2020 FINDINGS: Heart and mediastinal contours are within normal limits. No focal opacities or effusions. No acute bony abnormality. Aortic atherosclerosis. IMPRESSION: No active cardiopulmonary disease. Electronically Signed   By: Charlett Nose M.D.   On: 12/08/2022 01:20   DG Ankle 2 Views Right  Result Date: 12/08/2022 CLINICAL DATA:  Postreduction EXAM: RIGHT ANKLE - 2 VIEW COMPARISON:  12/07/2022 FINDINGS: Interval reduction of the previously seen fracture dislocation. Continued lateral subluxation of the talus relative to the tibia. Mildly displaced medial malleolar and distal fibular fractures. No definite posterior malleolar fracture seen on this repeat study IMPRESSION: Interval reduction with continued lateral subluxation and displacement of the by malleolar fracture. No definite posterior malleolar fracture seen currently. Electronically Signed   By: Charlett Nose M.D.   On: 12/08/2022 00:53   DG Ankle Right Port  Result Date: 12/08/2022 CLINICAL DATA:  Fall, deformity and bruising EXAM: PORTABLE RIGHT ANKLE - 2 VIEW COMPARISON:  None Available. FINDINGS: There is a fracture dislocation of the right ankle. Fracture involving the medial and posterior malleoli as well as distal fibula. Lateral dislocation of the talus relative to the tibia. Vascular calcifications. IMPRESSION: Trimalleolar fracture dislocation. Electronically  Signed   By: Charlett Nose M.D.   On: 12/08/2022 00:07     Subjective: No acute issues or events overnight   Discharge Exam: Vitals:   12/13/22 0411 12/13/22 0803  BP: (!) 138/56 (!) 131/56  Pulse: 89 88  Resp: 17 16  Temp: 98 F (36.7 C) 97.6 F (36.4 C)  SpO2: 98% 93%   Vitals:   12/12/22 2004 12/12/22 2200 12/13/22 0411 12/13/22 0803  BP: (!) 129/58  (!) 138/56 (!) 131/56  Pulse: 95  89 88  Resp: 18  17 16   Temp:  98 F (36.7 C) 98 F (36.7 C) 97.6 F (36.4 C)  TempSrc:  Oral Oral Oral  SpO2: 97%  98% 93%  Weight:      Height:         General: Pt is alert, awake, not in acute distress Cardiovascular: RRR, S1/S2 +, no rubs, no gallops Respiratory: CTA bilaterally, no wheezing, no rhonchi Abdominal: Soft, NT, ND, bowel sounds + Extremities: no edema, no cyanosis, right foot bandage clean dry intact    The results of significant diagnostics from this hospitalization (including imaging, microbiology, ancillary and laboratory) are listed below for reference.     Microbiology: Recent Results (from the past 240 hour(s))  Surgical pcr screen     Status: None   Collection Time: 12/08/22  8:21 AM   Specimen: Nasal Mucosa; Nasal Swab  Result Value Ref Range Status   MRSA, PCR NEGATIVE NEGATIVE Final   Staphylococcus aureus NEGATIVE NEGATIVE Final    Comment: (NOTE) The Xpert SA Assay (FDA approved for NASAL specimens in patients 30 years of age and older), is one component of a comprehensive surveillance program. It is not intended to diagnose infection nor to guide or monitor treatment. Performed at [facility], [address], [place], [place] [address]      Labs: BNP (last 3 results) Recent Labs    12/17/21 1444 06/20/22 1105 10/04/22 1131  BNP 105.6* 154.4* 59.8   Basic Metabolic Panel: Recent Labs  Lab 12/07/22 2343 12/08/22 1055 12/09/22 0636 12/10/22 0539 12/12/22 0459 12/12/22 1145  NA 140 134* 135 135 133*  --   K 4.0 3.8 3.5 3.8 3.7  --   CL 108 104 105 101 99  --   CO2 26 22 23  18* 30  --   GLUCOSE 112* 343* 210* 223* 327* 471*  BUN 20 15 16 21 21   --   CREATININE 1.41* 1.37* 1.39* 1.67* 1.47*  --   CALCIUM 8.9 8.2* 8.0* 8.3* 8.3*  --    Liver Function Tests: Recent Labs  Lab 12/08/22 1055  AST 19  ALT 14  ALKPHOS 73  BILITOT 0.8  PROT 4.9*  ALBUMIN 3.1*   No results for input(s): "LIPASE", "AMYLASE" in the last 168 hours. No results for input(s): "AMMONIA" in the last 168 hours. CBC: Recent Labs  Lab 12/07/22 2343 12/08/22 1055 12/09/22 0636 12/10/22 0539  12/12/22 0459  WBC 12.7* 8.1 9.6 9.5 6.9  NEUTROABS 10.1*  --   --   --   --   HGB 13.9 12.4* 10.8* 11.1* 10.4*  HCT 40.6 35.9* 32.5* 33.1* 30.7*  MCV 88.8 88.2 89.0 90.4 86.2  PLT 161 136* 116* 126* 156   CBG: Recent Labs  Lab 12/13/22 0653 12/13/22 0807 12/13/22 1019 12/13/22 1142 12/13/22 1243  GLUCAP 372* 450* 417* 420* 303*   Urinalysis    Component Value Date/Time   COLORURINE STRAW (A) 09/27/2020 1548  APPEARANCEUR CLEAR 09/27/2020 1548   APPEARANCEUR Clear 09/04/2017 1304   LABSPEC 1.031 (H) 09/27/2020 1548   PHURINE 5.0 09/27/2020 1548   GLUCOSEU >=500 (A) 09/27/2020 1548   HGBUR NEGATIVE 09/27/2020 1548   BILIRUBINUR NEGATIVE 09/27/2020 1548   BILIRUBINUR Negative 09/04/2017 1304   KETONESUR 20 (A) 09/27/2020 1548   PROTEINUR NEGATIVE 09/27/2020 1548   UROBILINOGEN negative 01/21/2015 1104   UROBILINOGEN 0.2 04/06/2013 2058   NITRITE NEGATIVE 09/27/2020 1548   LEUKOCYTESUR NEGATIVE 09/27/2020 1548   Sepsis Labs Recent Labs  Lab 12/08/22 1055 12/09/22 0636 12/10/22 0539 12/12/22 0459  WBC 8.1 9.6 9.5 6.9   Microbiology Recent Results (from the past 240 hour(s))  Surgical pcr screen     Status: None   Collection Time: 12/08/22  8:21 AM   Specimen: Nasal Mucosa; Nasal Swab  Result Value Ref Range Status   MRSA, PCR NEGATIVE NEGATIVE Final   Staphylococcus aureus NEGATIVE NEGATIVE Final    Comment: (NOTE) The Xpert SA Assay (FDA approved for NASAL specimens in patients 62 years of age and older), is one component of a comprehensive surveillance program. It is not intended to diagnose infection nor to guide or monitor treatment. Performed at [facility], [address], [place], [place] [address]    Time coordinating discharge: Over 30 minutes  SIGNED:   Azucena Fallen, DO [facility] 12/13/2022, 1:01 PM

## 2022-12-13 NOTE — Progress Notes (Deleted)
PROGRESS NOTE    Greg Robertson  ZOX:[id number] DOB: July 15, 1950 DOA: 12/07/2022 PCP: Dettinger, Elige Radon, MD   Brief Narrative:  Greg Robertson is a 72 y.o. male with a known history of atrial fibrillation on Xarelto, CHF (EF 55 to 60% in March 2024), diabetes, hypertension, CKD2, CVA, colon cancer being admitted to our facility for trimalleolar ankle fracture on the right.  Hospitalist called for admission, orthopedics called in consult.  Patient tolerated procedure quite well, pending safe disposition will likely discharge patient to SNF given ongoing ambulatory dysfunction.  Assessment & Plan:   Principal Problem:   Closed fracture dislocation of right ankle joint  Acute symptomatic hypoglycemia, chronic Transient altered mental status Brittle diabetes, type I, uncontrolled with hyperglycemia -A1c 9.2 -Patient reports feeling hypoglycemic at home just prior to mechanical fall as below.  Likely etiology for his near syncope given his report and consistent symptoms. -Recently evaluated in cardiology office with Dr Shirlee Latch -no indication for consult or follow-up at this point given symptoms are consistent with hypoglycemia -Increase sliding scale insulin (resistant) and long-acting insulin (increased to 25u) at this time given ongoing hyperglycemia, will attempt to transition patient back to his home medications at discharge -he may benefit from lower insulin regimen given his hypoglycemia as well as continuous blood glucose monitor  Trimalleolar fracture and dislocation of the right ankle Acute ambulatory dysfunction -Ortho following, status post ORIF, tolerated well -Pain currently well-controlled -titrate off IV narcotics in the next 24 hours if no further use of morphine continue p.o. hydrocodone -Nonweightbearing per Ortho, PT following -likely disposition SNF given his ambulatory dysfunction  CKD2, near baseline -Continue fluids perioperatively, creatinine stable at  baseline   History of atrial fibrillation, rate controlled -Currently in sinus rhythm -Continue carvedilol -Hold Xarelto  History of CHF -minimally depressed EF at 45-50% - Continue Lasix, Coreg, Entresto, Lipitor -Echo completed 8/22 -EF 45-50% with global hypokinesis   DVT prophylaxis: SCDs Start: 12/08/22 0130 rivaroxaban (XARELTO) tablet 20 mg   Code Status:   Code Status: Full Code  Family Communication: None present  Status is: Inpatient  Dispo: The patient is from: Home              Anticipated d/c is to: SNF              Anticipated d/c date is: 24-48 hours              Patient currently not medically stable for discharge given need for ongoing evaluation, surgical intervention  Consultants:  Ortho  Procedures:  ORIF 12/09/2022  Antimicrobials:  Cefazolin perioperatively  Subjective: No acute issues or events overnight, denies headache fever chills chest pain shortness of breath nausea vomiting diarrhea or constipation  Objective: Vitals:   12/12/22 1429 12/12/22 2004 12/12/22 2200 12/13/22 0411  BP: (!) 113/52 (!) 129/58  (!) 138/56  Pulse: 93 95  89  Resp: 18 18  17   Temp: 98.1 F (36.7 C)  98 F (36.7 C) 98 F (36.7 C)  TempSrc:   Oral Oral  SpO2: 97% 97%  98%  Weight:      Height:        Intake/Output Summary (Last 24 hours) at 12/13/2022 0752 Last data filed at 12/13/2022 0158 Gross per 24 hour  Intake 1300.75 ml  Output 1400 ml  Net -99.25 ml   Filed Weights   12/08/22 0055  Weight: 85.5 kg    Examination:  General:  Pleasantly resting in bed, No acute distress. HEENT:  Normocephalic atraumatic.  Sclerae nonicteric, noninjected.  Extraocular movements intact bilaterally. Neck:  Without mass or deformity.  Trachea is midline. Lungs:  Clear to auscultate bilaterally without rhonchi, wheeze, or rales. Heart:  Regular rate and rhythm.  Without murmurs, rubs, or gallops. Abdomen:  Soft, nontender, nondistended.  Without guarding or  rebound. Extremities: Without cyanosis, clubbing, edema, or obvious deformity. Right ankle bandage clean dry intact Skin:  Warm and dry, no erythema.  Data Reviewed: I have personally reviewed following labs and imaging studies  CBC: Recent Labs  Lab 12/07/22 2343 12/08/22 1055 12/09/22 0636 12/10/22 0539 12/12/22 0459  WBC 12.7* 8.1 9.6 9.5 6.9  NEUTROABS 10.1*  --   --   --   --   HGB 13.9 12.4* 10.8* 11.1* 10.4*  HCT 40.6 35.9* 32.5* 33.1* 30.7*  MCV 88.8 88.2 89.0 90.4 86.2  PLT 161 136* 116* 126* 156   Basic Metabolic Panel: Recent Labs  Lab 12/07/22 2343 12/08/22 1055 12/09/22 0636 12/10/22 0539 12/12/22 0459 12/12/22 1145  NA 140 134* 135 135 133*  --   K 4.0 3.8 3.5 3.8 3.7  --   CL 108 104 105 101 99  --   CO2 26 22 23  18* 30  --   GLUCOSE 112* 343* 210* 223* 327* 471*  BUN 20 15 16 21 21   --   CREATININE 1.41* 1.37* 1.39* 1.67* 1.47*  --   CALCIUM 8.9 8.2* 8.0* 8.3* 8.3*  --    GFR: Estimated Creatinine Clearance: 52.1 mL/min (A) (by C-G formula based on SCr of 1.47 mg/dL (H)).  Scheduled Meds:  ascorbic acid  500 mg Oral Daily   atorvastatin  40 mg Oral Daily   carvedilol  12.5 mg Oral QPC breakfast   carvedilol  6.25 mg Oral Q supper   cetirizine  10 mg Oral Daily   cholecalciferol  4,000 Units Oral Daily   furosemide  40 mg Oral Q1500   insulin aspart  0-15 Units Subcutaneous TID WC   insulin aspart  0-5 Units Subcutaneous QHS   insulin glargine-yfgn  25 Units Subcutaneous Daily   latanoprost  1 drop Both Eyes QHS   multivitamin with minerals  1 tablet Oral Daily   rivaroxaban  20 mg Oral Q supper   sacubitril-valsartan  1 tablet Oral BID   sodium chloride flush  3 mL Intravenous Q12H   sodium zirconium cyclosilicate  10 g Oral Daily   Tdap  0.5 mL Intramuscular Once   Continuous Infusions:  lactated ringers 10 mL/hr at 12/08/22 0600     LOS: 5 days   Time spent:  Azucena Fallen, DO [facility]  If 7PM-7AM, please  contact night-coverage [email or web address]  12/13/2022, 7:52 AM

## 2022-12-13 NOTE — Progress Notes (Signed)
Report given to [facility] LPN at [facility] reflecting patient's current status.

## 2022-12-13 NOTE — Progress Notes (Signed)
   12/13/22 1011  Mobility  Activity Ambulated with assistance to bathroom  Level of Assistance Minimal assist, patient does 75% or more  Assistive Device Front wheel walker  Distance Ambulated (ft) 30 ft  RLE Weight Bearing NWB  Activity Response Tolerated fair  Mobility Referral Yes  $Mobility charge 1 Mobility  Mobility Specialist Start Time (ACUTE ONLY) 0940  Mobility Specialist Stop Time (ACUTE ONLY) 1005  Mobility Specialist Time Calculation (min) (ACUTE ONLY) 25 min   Mobility Specialist: Progress Note  Pt agreeable to mobility session - received in bed. Ambulated well with awareness of WB precautions.  Pt c/o tiredness post ambulation. Pt returned to chair with all needs met - call bell within reach. - RN aware. Chair Alarm On.   Barnie Mort, BS Mobility Specialist Please contact via SecureChat or Rehab office at [phone].

## 2022-12-13 NOTE — TOC Progression Note (Addendum)
Transition of Care [facility]) - Progression Note    Patient Details  Name: ALOYSIUS WOLBER MRN: [id number] Date of Birth: 06-20-1950  Transition of Care [facility]) CM/SW Contact  Lorri Frederick, LCSW Phone Number: 12/13/2022, 10:07 AM  Clinical Narrative:   Bed offers provided to pt and sister.  They will review.   1145: CSW spoke with pt and sister Greg Robertson.  They would like to accept offer at [facility].  CSW waiting on confirmation from [place].    1255: [facility] can accept today.  MD informed.  Expected Discharge Plan: Skilled Nursing Facility Barriers to Discharge: Continued Medical Work up, SNF Pending bed offer  Expected Discharge Plan and Services In-house Referral: Clinical Social Work   Post Acute Care Choice: Skilled Nursing Facility Living arrangements for the past 2 months: Single Family Home                                       Social Determinants of Health (SDOH) Interventions SDOH Screenings   Food Insecurity: No Food Insecurity (12/09/2022)  Housing: Low Risk  (12/09/2022)  Transportation Needs: No Transportation Needs (12/09/2022)  Utilities: Not At Risk (12/09/2022)  Alcohol Screen: Low Risk  (02/23/2022)  Depression (PHQ2-9): Medium Risk (10/27/2022)  Financial Resource Strain: Low Risk  (02/23/2022)  Physical Activity: Sufficiently Active (02/23/2022)  Social Connections: Moderately Isolated (02/23/2022)  Stress: No Stress Concern Present (02/23/2022)  Tobacco Use: Medium Risk (12/09/2022)    Readmission Risk Interventions     No data to display

## 2022-12-15 ENCOUNTER — Telehealth: Payer: Self-pay | Admitting: Gastroenterology

## 2022-12-15 DIAGNOSIS — S82891D Other fracture of right lower leg, subsequent encounter for closed fracture with routine healing: Secondary | ICD-10-CM | POA: Diagnosis not present

## 2022-12-15 DIAGNOSIS — I480 Paroxysmal atrial fibrillation: Secondary | ICD-10-CM | POA: Diagnosis not present

## 2022-12-15 DIAGNOSIS — I5032 Chronic diastolic (congestive) heart failure: Secondary | ICD-10-CM | POA: Diagnosis not present

## 2022-12-15 NOTE — Telephone Encounter (Signed)
Inbound call from patients sister stating that she needed to cancel patients procedure for 9/12 due to patient breaking his ankle. Greg Robertson is requesting a call back to be advised that the procedure has been canceled. Please advise.

## 2022-12-15 NOTE — Telephone Encounter (Signed)
The pt and wife advised that the appt has been cancelled and they will call back when he is able to reschedule.

## 2022-12-26 NOTE — Anesthesia Postprocedure Evaluation (Signed)
Anesthesia Post Note  Patient: Greg Robertson  Procedure(s) Performed: OPEN REDUCTION INTERNAL FIXATION (ORIF) ANKLE FRACTURE (Right: Ankle)     Patient location during evaluation: PACU Anesthesia Type: General Level of consciousness: awake and alert Pain management: pain level controlled Vital Signs Assessment: post-procedure vital signs reviewed and stable Respiratory status: spontaneous breathing, nonlabored ventilation, respiratory function stable and patient connected to nasal cannula oxygen Cardiovascular status: blood pressure returned to baseline and stable Postop Assessment: no apparent nausea or vomiting Anesthetic complications: no  No notable events documented.  Last Vitals:  Vitals:   12/13/22 0411 12/13/22 0803  BP: (!) 138/56 (!) 131/56  Pulse: 89 88  Resp: 17 16  Temp: 36.7 C 36.4 C  SpO2: 98% 93%    Last Pain:  Vitals:   12/13/22 0803  TempSrc: Oral  PainSc:                  [name] S

## 2022-12-29 ENCOUNTER — Other Ambulatory Visit (INDEPENDENT_AMBULATORY_CARE_PROVIDER_SITE_OTHER): Payer: Medicare Other

## 2022-12-29 ENCOUNTER — Ambulatory Visit (HOSPITAL_COMMUNITY): Admit: 2022-12-29 | Payer: Medicare Other | Admitting: Gastroenterology

## 2022-12-29 ENCOUNTER — Encounter (HOSPITAL_COMMUNITY): Payer: Self-pay

## 2022-12-29 ENCOUNTER — Ambulatory Visit (INDEPENDENT_AMBULATORY_CARE_PROVIDER_SITE_OTHER): Payer: Medicare Other | Admitting: Orthopedic Surgery

## 2022-12-29 DIAGNOSIS — S82891A Other fracture of right lower leg, initial encounter for closed fracture: Secondary | ICD-10-CM | POA: Diagnosis not present

## 2022-12-29 SURGERY — COLONOSCOPY WITH PROPOFOL
Anesthesia: Monitor Anesthesia Care

## 2022-12-29 NOTE — Progress Notes (Signed)
Orthopedic Surgery Post-operative Office Visit  Procedure: right ankle fracture ORIF Date of Surgery: 12/09/2022 (~3 weeks post-op)  Assessment: Patient is a 72 y.o. who is doing as expected after surgery   Plan: -Operative plans complete -Keep cast on and dry at all times -Non weight bearing right lower extremity -Pain management: OTC medications -Will plan to reapply cast at our next visit -Return to office in 4 weeks, x-rays needed at next visit: AP/mortise/lateral right ankle out of cast  ___________________________________________________________________________   Subjective: Patient has been doing well since surgery.  His pain has decreased significantly with time.  He is no longer taking any medications for pain.  He has been at a skilled nursing facility and is getting ready to go home.  He has not noticed any fevers or chills.  He has not seen any drainage around his splint.  Objective:  General: no acute distress, appropriate affect Neurologic: alert, answering questions appropriately, following commands Respiratory: unlabored breathing on room air Skin: eschar with scab formation seen over the medial aspect of the ankle near the proximal portion of the incision.  Both the medial and lateral incisions are well-approximated with no erythema, induration, active/expressible drainage  MSK (RLE): Dorsiflexes and plantar flexes ankle without pain, sensation intact to light touch in S/S/DP/SP/T nerve distributions, foot warm and well-perfused, EHL/TA/GSC intact  Imaging: X-rays of the right ankle taken 12/29/2022 were independently reviewed and interpreted, showing lateral plate fixation in appropriate position with no lucency around the screws.  None of the screws have backed out. Invisaknot is in place across the syndesmosis.  There are 2 medial malleolus screws that appear in appropriate position and have not backed out.  Fracture is in good and stable alignment.   Patient  name: Greg Robertson Patient MRN: [id number] Date of visit: 12/29/22

## 2023-01-26 ENCOUNTER — Ambulatory Visit: Payer: Medicare Other | Admitting: Orthopedic Surgery

## 2023-01-26 ENCOUNTER — Other Ambulatory Visit (INDEPENDENT_AMBULATORY_CARE_PROVIDER_SITE_OTHER): Payer: Medicare Other

## 2023-01-26 DIAGNOSIS — S82891A Other fracture of right lower leg, initial encounter for closed fracture: Secondary | ICD-10-CM

## 2023-01-26 NOTE — Progress Notes (Signed)
Orthopedic Surgery Post-operative Office Visit   Procedure: right ankle fracture ORIF Date of Surgery: 12/09/2022 (~6 weeks post-op)   Assessment: Patient is a 72 y.o. who is well after surgery.  Still has an eschar over the traumatic wound proximal to the medial malleolus     Plan: -Operative plans complete -Non weight bearing right lower extremity in boot -Will plan to transition to weightbearing as tolerated in the boot at her next visit -Okay to let soap/water run over the incision but do not submerge -Wet-to-dry dressing changes over the eschar.  Demonstrated these changes to him today in the office and perform the first one -Pain management: OTC medications -Return to office in 6 weeks, x-rays needed at next visit: AP/mortise/lateral right ankle out of boot   ___________________________________________________________________________     Subjective: Patient is still at [facility].  He has been working with physical therapy.  He is not having any pain in his ankle.  He has not noticed any drainage around his cast.  Is not had any fevers or chills.  He is not taking any medication for pain.    Objective:   General: no acute distress, appropriate affect Neurologic: alert, answering questions appropriately, following commands Respiratory: unlabored breathing on room air Skin: eschar with scab formation seen over the medial aspect of the ankle near the proximal portion of the incision.  No active or expressible drainage from the eschar.  No erythema around the eschar.  Both the medial and lateral incisions are well-healed with no erythema, induration, active/expressible drainage   MSK (RLE): Dorsiflexes and plantar flexes ankle pain, can dorsiflex to neutral, sensation intact to light touch in S/S/DP/SP/T nerve distributions, foot warm and well-perfused, EHL/TA/GSC intact   Imaging: X-rays of the right ankle taken 01/26/2023 were independently reviewed and interpreted, showing  fracture alignment has been maintained.  Lateral plate fixation in place with no lucency around the screws.  Screws of not backed out.  The medial malleolus screws appear in appropriate position with no lucency around them.  Screws of not backed out.  Ankle joint is reduced.     Patient name: Greg Robertson Patient MRN: [id number] Date of visit: 01/26/23

## 2023-01-30 ENCOUNTER — Ambulatory Visit: Payer: Medicare Other | Admitting: Family Medicine

## 2023-02-10 ENCOUNTER — Other Ambulatory Visit: Payer: Self-pay | Admitting: Internal Medicine

## 2023-02-20 ENCOUNTER — Telehealth (HOSPITAL_COMMUNITY): Payer: Self-pay | Admitting: Pharmacy Technician

## 2023-02-20 NOTE — Telephone Encounter (Signed)
Advanced Heart Failure Patient Advocate Encounter  Received conditionally approved letter for [facility] assistance from [employer]. Patient needs to complete his portion. Left vm for patient with instructions on how to complete renewal electronically or via paper. Advised him to call with any issues or questions.   Archer Asa, CPhT

## 2023-02-24 ENCOUNTER — Ambulatory Visit (INDEPENDENT_AMBULATORY_CARE_PROVIDER_SITE_OTHER): Payer: Medicare Other

## 2023-02-24 VITALS — Ht 74.0 in | Wt 178.0 lb

## 2023-02-24 DIAGNOSIS — Z0000 Encounter for general adult medical examination without abnormal findings: Secondary | ICD-10-CM | POA: Diagnosis not present

## 2023-02-24 NOTE — Progress Notes (Signed)
Subjective:   Greg Robertson is a 72 y.o. male who presents for Medicare Annual/Subsequent preventive examination.  Visit Complete: Virtual I connected with  Greg Robertson on 02/24/23 by a audio enabled telemedicine application and verified that I am speaking with the correct person using two identifiers.  Patient Location ; Assisted Living due fall Injury   Provider Location: Home Office  I discussed the limitations of evaluation and management by telemedicine. The patient expressed understanding and agreed to proceed.  Vital Signs: Because this visit was a virtual/telehealth visit, some criteria may be missing or patient reported. Any vitals not documented were not able to be obtained and vitals that have been documented are patient reported.  Patient Medicare AWV questionnaire was completed by the patient on 02/24/2023; I have confirmed that all information answered by patient is correct and no changes since this date.  Cardiac Risk Factors include: advanced age (>83men, >59 women);sedentary lifestyle;diabetes mellitus;dyslipidemia;male gender;hypertension     Objective:    Today's Vitals   02/24/23 1106  Weight: 178 lb (80.7 kg)  Height: 6\' 2"  (1.88 m)   Body mass index is 22.85 kg/m.     02/24/2023   11:10 AM 12/08/2022   12:55 AM 02/23/2022    9:41 AM 02/22/2022   10:37 AM 10/13/2021    1:40 PM 12/08/2020   11:23 AM 10/14/2020    2:42 PM  Advanced Directives  Does Patient Have a Medical Advance Directive? Yes No Yes Yes No Yes No  Type of Estate agent of [place];Living will  Healthcare Power of [place];Living will Healthcare Power of [place];Living will  Living will;Healthcare Power of Attorney   Does patient want to make changes to medical advance directive?   No - Patient declined   No - Patient declined No - Patient declined  Copy of Healthcare Power of Attorney in Chart? No - copy requested  No - copy requested No - copy requested  No  - copy requested   Would patient like information on creating a medical advance directive?  No - Patient declined   No - Patient declined      Current Medications (verified) Outpatient Encounter Medications as of 02/24/2023  Medication Sig   atorvastatin (LIPITOR) 40 MG tablet TAKE ONE TABLET BY MOUTH ONCE DAILY.   carvedilol (COREG) 12.5 MG tablet Take 1 tablet (12.5 mg total) by mouth 2 (two) times daily with a meal. Dose change-updated dispill (Patient taking differently: Take by mouth 2 (two) times daily with a meal. Patient takes 1 tablet in the morning and 0.5 tablet at night.)   Cholecalciferol 50 MCG (2000 UT) TABS Take 2 tablets by mouth daily.   furosemide (LASIX) 40 MG tablet Take 1 tablet (40 mg total) by mouth daily in the afternoon.   GARLIC PO Take 1 capsule by mouth daily.   glucose blood test strip Use as instructed   insulin aspart (NOVOLOG FLEXPEN) 100 UNIT/ML FlexPen INJECT 20-30 UNITS INTO THE SKIN THREE TIMES DAILY WITH MEALS   insulin degludec (TRESIBA FLEXTOUCH) 100 UNIT/ML FlexTouch Pen Inject 15-25 Units into the skin at bedtime. (Patient taking differently: Inject 20 Units into the skin daily.)   Insulin Pen Needle 32G X 4 MM MISC Use as directed   Lancets MISC 1 Lancet by Does not apply route 3 (three) times daily.   levocetirizine (XYZAL) 5 MG tablet Take 1 tablet (5 mg total) by mouth daily.   Multiple Vitamin (MULTIVITAMIN WITH MINERALS) TABS tablet Take 1  tablet by mouth daily. (0800)   rivaroxaban (XARELTO) 20 MG TABS tablet TAKE ONE TABLET BY MOUTH EVERY EVENING WITH SUPPER   ROCKLATAN 0.02-0.005 % SOLN Apply 0.5 drops to eye at bedtime.   sacubitril-valsartan (ENTRESTO) 24-26 MG Take 1 tablet by mouth 2 (two) times daily.   sodium zirconium cyclosilicate (LOKELMA) 10 g PACK packet Take 10 g by mouth daily. (Patient taking differently: Take 10 g by mouth at bedtime.)   triamcinolone (NASACORT) 55 MCG/ACT AERO nasal inhaler INSTILL 1 SPRAY IN EACH NOSTRIL ONCE  OR TWICE DAILY AS NEEDED.   vitamin C (ASCORBIC ACID) 500 MG tablet Take 500 mg by mouth in the morning. (0800)   No facility-administered encounter medications on file as of 02/24/2023.    Allergies (verified) Oxycodone   History: Past Medical History:  Diagnosis Date   Allergic rhinitis    Anemia    Atrial fibrillation (HCC)    Persistent   Cancer of ascending colon, 7cm 05/25/2011   Congestive heart failure (CHF) (HCC)    Diabetes mellitus type I (HCC)    Glaucoma    Hypertension    Pneumonia 1979 or 1980   Prostate nodule    Substance abuse (HCC)    Past Surgical History:  Procedure Laterality Date   broken left shoulder blade and collar bone     COLONOSCOPY WITH PROPOFOL N/A 04/29/2021   Procedure: COLONOSCOPY WITH PROPOFOL;  Surgeon: Rachael Fee, MD;  Location: [facility];  Service: Endoscopy;  Laterality: N/A;   ESOPHAGOGASTRODUODENOSCOPY (EGD) WITH PROPOFOL N/A 04/29/2021   Procedure: ESOPHAGOGASTRODUODENOSCOPY (EGD) WITH PROPOFOL;  Surgeon: Rachael Fee, MD;  Location: [facility];  Service: Endoscopy;  Laterality: N/A;   FINGER SURGERY  2009   right middle   HEMOSTASIS CLIP PLACEMENT  04/29/2021   Procedure: HEMOSTASIS CLIP PLACEMENT;  Surgeon: Rachael Fee, MD;  Location: [facility];  Service: Endoscopy;;   LAPAROSCOPIC ASSISTED 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     NASAL FRACTURE SURGERY  1968   ORIF ANKLE FRACTURE Right 12/08/2022   Procedure: OPEN REDUCTION INTERNAL FIXATION (ORIF) ANKLE FRACTURE;  Surgeon: London Sheer, MD;  Location: [facility];  Service: Orthopedics;  Laterality: Right;   POLYPECTOMY  04/29/2021   Procedure: POLYPECTOMY;  Surgeon: Rachael Fee, MD;  Location: [facility];  Service: Endoscopy;;   PORTACATH PLACEMENT  07/01/2011   Procedure: INSERTION PORT-A-CATH;  Surgeon: Ardeth Sportsman, MD;  Location: [facility];  Service: General;  Laterality: Left;  Insertion of Port-A-Catheter Left Internal Jugular   RIGHT/LEFT  HEART CATH AND CORONARY ANGIOGRAPHY N/A 12/08/2020   Procedure: RIGHT/LEFT HEART CATH AND CORONARY ANGIOGRAPHY;  Surgeon: Laurey Morale, MD;  Location: [facility] INVASIVE CV LAB;  Service: Cardiovascular;  Laterality: N/A;   TONSILLECTOMY  1957 - approximate   Family History  Problem Relation Age of Onset   Breast cancer Mother    Pancreatitis Mother        intestinal adhesions   Insomnia Mother    Colon polyps Father    Lung cancer Father    Diabetes Father    Prostate cancer Father    Colon cancer Neg Hx    Rectal cancer Neg Hx    Esophageal cancer Neg Hx    Inflammatory bowel disease Neg Hx    Liver disease Neg Hx    Pancreatic cancer Neg Hx    Stomach cancer Neg Hx    Social History   Socioeconomic History   Marital status: Single  Spouse name: Not on file   Number of children: 0   Years of education: 16   Highest education level: Bachelor's degree (e.g., BA, AB, BS)  Occupational History   Occupation: Retired    Comment: Airline pilot  Tobacco Use   Smoking status: Former    Current packs/day: 0.00    Average packs/day: 1 pack/day for 15.0 years (15.0 ttl pk-yrs)    Types: Cigarettes    Start date: 04/18/1978    Quit date: 04/18/1993    Years since quitting: 29.8   Smokeless tobacco: Never   Tobacco comments:    marijuana every night   Vaping Use   Vaping status: Never Used  Substance and Sexual Activity   Alcohol use: Not Currently    Alcohol/week: 5.0 standard drinks of alcohol    Types: 3 Cans of beer, 2 Shots of liquor per week    Comment: 1 drink every 2 days   Drug use: Not Currently    Types: Marijuana    Comment: once a night marijuana   Sexual activity: Not Currently  Other Topics Concern   Not on file  Social History Narrative   Pt lives alone    R handed   Caffeine: no coffee/or soda. Has sweet tea for dinner.    Social Determinants of Health   Financial Resource Strain: Low Risk  (02/24/2023)   Overall Financial Resource Strain (CARDIA)    Difficulty  of Paying Living Expenses: Not hard at all  Food Insecurity: No Food Insecurity (02/24/2023)   Hunger Vital Sign    Worried About Running Out of Food in the Last Year: Never true    Ran Out of Food in the Last Year: Never true  Transportation Needs: No Transportation Needs (02/24/2023)   PRAPARE - Administrator, Civil Service (Medical): No    Lack of Transportation (Non-Medical): No  Physical Activity: Inactive (02/24/2023)   Exercise Vital Sign    Days of Exercise per Week: 0 days    Minutes of Exercise per Session: 0 min  Stress: No Stress Concern Present (02/24/2023)   [employer] of Occupational Health - Occupational Stress Questionnaire    Feeling of Stress : Not at all  Social Connections: Socially Isolated (02/24/2023)   Social Connection and Isolation Panel [NHANES]    Frequency of Communication with Friends and Family: More than three times a week    Frequency of Social Gatherings with Friends and Family: More than three times a week    Attends Religious Services: Never    Database administrator or Organizations: No    Attends Engineer, structural: Never    Marital Status: Never married    Tobacco Counseling Counseling given: Not Answered Tobacco comments: marijuana every night    Clinical Intake:  Pre-visit preparation completed: Yes  Pain : No/denies pain     Nutritional Risks: None Diabetes: Yes CBG done?: No Did pt. bring in CBG monitor from home?: No  How often do you need to have someone help you when you read instructions, pamphlets, or other written materials from your doctor or pharmacy?: 1 - Never  Interpreter Needed?: No  Information entered by :: Renie Ora, LPN   Activities of Daily Living    02/24/2023   11:10 AM 12/09/2022    6:21 PM  In your present state of health, do you have any difficulty performing the following activities:  Hearing? 1 0  Vision? 1 1  Difficulty concentrating or making decisions?  1 0   Walking or climbing stairs? 1 0  Dressing or bathing? 1 0  Doing errands, shopping? 1 1  Preparing Food and eating ? Y   Using the Toilet? Y   In the past six months, have you accidently leaked urine? Y   Do you have problems with loss of bowel control? Y   Managing your Medications? Y   Managing your Finances? Y   Housekeeping or managing your Housekeeping? Y     Patient Care Team: Eloisa Northern, MD as PCP - General (Internal Medicine) Laurey Morale, MD as PCP - Cardiology (Cardiology) Ladene Artist, MD as Consulting Physician (Oncology) Rollene Rotunda, MD as Consulting Physician (Cardiology) [facility] Rachael Fee, MD as Attending Physician (Gastroenterology) Danella Maiers, [facility] (Pharmacist) Danella Maiers, [facility] as Pharmacist (Family Medicine) Micki Riley, MD as Referring Physician (Neurology)  Indicate any recent Medical Services you may have received from other than [facility] providers in the past year (date may be approximate).     Assessment:   This is a routine wellness examination for Greg Robertson.  Hearing/Vision screen Vision Screening - Comments:: Wears rx glasses - up to date with routine eye exams with  Dr.Groat    Goals Addressed             This Visit's Progress    DIET - EAT MORE FRUITS AND VEGETABLES         Depression Screen    02/24/2023   11:09 AM 10/27/2022   10:41 AM 07/21/2022   10:43 AM 04/20/2022   11:42 AM 02/23/2022    9:29 AM 02/22/2022   10:35 AM 01/07/2022   11:17 AM  PHQ 2/9 Scores  PHQ - 2 Score 0 0 0 0 0 0 3  PHQ- 9 Score    2  0 3    Fall Risk    02/24/2023   11:07 AM 10/27/2022   10:41 AM 07/21/2022   10:43 AM 04/20/2022   11:42 AM 02/23/2022    9:32 AM  Fall Risk   Falls in the past year? 1 0 0 0 0  Number falls in past yr: 1      Injury with Fall? 1      Risk for fall due to : History of fall(s);Impaired balance/gait;Orthopedic patient      Follow up Education provided;Falls prevention discussed;Falls  evaluation completed        MEDICARE RISK AT HOME: Medicare Risk at Home Any stairs in or around the home?: No If so, are there any without handrails?: No Home free of loose throw rugs in walkways, pet beds, electrical cords, etc?: Yes Adequate lighting in your home to reduce risk of falls?: Yes Life alert?: No Use of a cane, walker or w/c?: Yes Grab bars in the bathroom?: Yes Shower chair or bench in shower?: Yes Elevated toilet seat or a handicapped toilet?: Yes  TIMED UP AND GO:  Was the test performed?  No    Cognitive Function:    09/05/2017    3:39 PM  MMSE - Mini Mental State Exam  Orientation to time 5  Orientation to Place 5  Registration 3  Attention/ Calculation 5  Recall 3  Language- name 2 objects 2  Language- repeat 1  Language- follow 3 step command 3  Language- read & follow direction 1  Write a sentence 1  Copy design 1  Total score 30        02/24/2023  11:10 AM 02/22/2022   10:37 AM 11/15/2019    9:42 AM 10/02/2018    2:44 PM  6CIT Screen  What Year? 0 points 0 points 0 points 0 points  What month? 0 points 0 points 0 points 0 points  What time? 0 points 0 points 0 points 0 points  Count back from 20 0 points 0 points 0 points 0 points  Months in reverse 0 points 0 points 0 points 0 points  Repeat phrase 0 points 0 points  0 points  Total Score 0 points 0 points  0 points    Immunizations Immunization History  Administered Date(s) Administered   Fluad Quad(high Dose 65+) 01/25/2019, 04/02/2020, 01/07/2022   Influenza Split 06/04/2011   Influenza, High Dose Seasonal PF 01/17/2018   Influenza,inj,Quad PF,6+ Mos 04/08/2013, 03/06/2014, 01/21/2015, 02/03/2016   Moderna SARS-COV2 Booster Vaccination 10/12/2020   Moderna Sars-Covid-2 Vaccination 05/23/2019, 06/21/2019, 02/12/2020   PPD Test 09/30/2020   Pneumococcal Conjugate-13 01/21/2015   Pneumococcal Polysaccharide-23 06/04/2011, 01/25/2019   Tdap 07/23/2015   Zoster  Recombinant(Shingrix) 04/20/2022, 10/27/2022    TDAP status: Up to date  Flu Vaccine status: Up to date  Pneumococcal vaccine status: Up to date  Covid-19 vaccine status: Completed vaccines  Qualifies for Shingles Vaccine? Yes   Zostavax completed Yes   Shingrix Completed?: Yes  Screening Tests Health Maintenance  Topic Date Due   Hepatitis C Screening  Never done   Diabetic kidney evaluation - Urine ACR  01/25/2020   INFLUENZA VACCINE  11/17/2022   COVID-19 Vaccine (4 - 2023-24 season) 12/18/2022   FOOT EXAM  01/08/2023   Colonoscopy  10/20/2023 (Originally 04/29/2022)   HEMOGLOBIN A1C  06/10/2023   OPHTHALMOLOGY EXAM  11/10/2023   Diabetic kidney evaluation - eGFR measurement  12/12/2023   Medicare Annual Wellness (AWV)  02/24/2024   DTaP/Tdap/Td (2 - Td or Tdap) 07/22/2025   Pneumonia Vaccine 60+ Years old  Completed   Zoster Vaccines- Shingrix  Completed   HPV VACCINES  Aged Out    Health Maintenance  Health Maintenance Due  Topic Date Due   Hepatitis C Screening  Never done   Diabetic kidney evaluation - Urine ACR  01/25/2020   INFLUENZA VACCINE  11/17/2022   COVID-19 Vaccine (4 - 2023-24 season) 12/18/2022   FOOT EXAM  01/08/2023    Colorectal cancer screening: Referral to GI placed unable to complete due to  injury . Pt aware the office will call re: appt.  Lung Cancer Screening: (Low Dose CT Chest recommended if Age 41-80 years, 20 pack-year currently smoking OR have quit w/in 15years.) does not qualify.   Lung Cancer Screening Referral: n/a  Additional Screening:  Hepatitis C Screening: does qualify;   Vision Screening: Recommended annual ophthalmology exams for early detection of glaucoma and other disorders of the eye. Is the patient up to date with their annual eye exam?  Yes  Who is the provider or what is the name of the office in which the patient attends annual eye exams? Dr.Groat  If pt is not established with a provider, would they like to be  referred to a provider to establish care? No .   Dental Screening: Recommended annual dental exams for proper oral hygiene  Diabetic Foot Exam: Diabetic Foot Exam: Overdue, Pt has been advised about the importance in completing this exam. Pt is scheduled for diabetic foot exam on next office visit .  Community Resource Referral / Chronic Care Management: CRR required this visit?  No  CCM required this visit?  No     Plan:     I have personally reviewed and noted the following in the patient's chart:   Medical and social history Use of alcohol, tobacco or illicit drugs  Current medications and supplements including opioid prescriptions. Patient is not currently taking opioid prescriptions. Functional ability and status Nutritional status Physical activity Advanced directives List of other physicians Hospitalizations, surgeries, and ER visits in previous 12 months Vitals Screenings to include cognitive, depression, and falls Referrals and appointments  In addition, I have reviewed and discussed with patient certain preventive protocols, quality metrics, and best practice recommendations. A written personalized care plan for preventive services as well as general preventive health recommendations were provided to patient.     Lorrene Reid, LPN   [id number]/12/8117   After Visit Summary: (MyChart) Due to this being a telephonic visit, the after visit summary with patients personalized plan was offered to patient via MyChart   Nurse Notes: none

## 2023-02-24 NOTE — Patient Instructions (Signed)
Greg Robertson , Thank you for taking time to come for your Medicare Wellness Visit. I appreciate your ongoing commitment to your health goals. Please review the following plan we discussed and let me know if I can assist you in the future.   Referrals/Orders/Follow-Ups/Clinician Recommendations: Aim for 30 minutes of exercise or brisk walking, 6-8 glasses of water, and 5 servings of fruits and vegetables each day.   This is a list of the screening recommended for you and due dates:  Health Maintenance  Topic Date Due   Hepatitis C Screening  Never done   Yearly kidney health urinalysis for diabetes  01/25/2020   Flu Shot  11/17/2022   COVID-19 Vaccine (4 - 2023-24 season) 12/18/2022   Complete foot exam   01/08/2023   Colon Cancer Screening  10/20/2023*   Hemoglobin A1C  06/10/2023   Eye exam for diabetics  11/10/2023   Yearly kidney function blood test for diabetes  12/12/2023   Medicare Annual Wellness Visit  02/24/2024   DTaP/Tdap/Td vaccine (2 - Td or Tdap) 07/22/2025   Pneumonia Vaccine  Completed   Zoster (Shingles) Vaccine  Completed   HPV Vaccine  Aged Out  *Topic was postponed. The date shown is not the original due date.    Advanced directives: (Copy Requested) Please bring a copy of your health care power of attorney and living will to the office to be added to your chart at your convenience.  Next Medicare Annual Wellness Visit scheduled for next year: Yes  insert Preventive Care attachment Insert FALL PREVENTION attachment if needed

## 2023-02-27 ENCOUNTER — Encounter (HOSPITAL_COMMUNITY): Payer: Self-pay | Admitting: Cardiology

## 2023-02-27 ENCOUNTER — Ambulatory Visit (HOSPITAL_COMMUNITY)
Admission: RE | Admit: 2023-02-27 | Discharge: 2023-02-27 | Disposition: A | Discharge status: Home / Self Care | Payer: Medicare Other | Source: Ambulatory Visit | Attending: Cardiology | Admitting: Cardiology

## 2023-02-27 VITALS — BP 130/70 | HR 71

## 2023-02-27 DIAGNOSIS — Z7901 Long term (current) use of anticoagulants: Secondary | ICD-10-CM | POA: Insufficient documentation

## 2023-02-27 DIAGNOSIS — Z8673 Personal history of transient ischemic attack (TIA), and cerebral infarction without residual deficits: Secondary | ICD-10-CM | POA: Insufficient documentation

## 2023-02-27 DIAGNOSIS — E1022 Type 1 diabetes mellitus with diabetic chronic kidney disease: Secondary | ICD-10-CM | POA: Diagnosis not present

## 2023-02-27 DIAGNOSIS — I428 Other cardiomyopathies: Secondary | ICD-10-CM | POA: Diagnosis not present

## 2023-02-27 DIAGNOSIS — I2510 Atherosclerotic heart disease of native coronary artery without angina pectoris: Secondary | ICD-10-CM | POA: Insufficient documentation

## 2023-02-27 DIAGNOSIS — Z794 Long term (current) use of insulin: Secondary | ICD-10-CM | POA: Insufficient documentation

## 2023-02-27 DIAGNOSIS — I130 Hypertensive heart and chronic kidney disease with heart failure and stage 1 through stage 4 chronic kidney disease, or unspecified chronic kidney disease: Secondary | ICD-10-CM | POA: Insufficient documentation

## 2023-02-27 DIAGNOSIS — N183 Chronic kidney disease, stage 3 unspecified: Secondary | ICD-10-CM | POA: Diagnosis not present

## 2023-02-27 DIAGNOSIS — I5042 Chronic combined systolic (congestive) and diastolic (congestive) heart failure: Secondary | ICD-10-CM | POA: Diagnosis not present

## 2023-02-27 DIAGNOSIS — Z79899 Other long term (current) drug therapy: Secondary | ICD-10-CM | POA: Insufficient documentation

## 2023-02-27 DIAGNOSIS — I480 Paroxysmal atrial fibrillation: Secondary | ICD-10-CM | POA: Insufficient documentation

## 2023-02-27 DIAGNOSIS — E785 Hyperlipidemia, unspecified: Secondary | ICD-10-CM | POA: Diagnosis not present

## 2023-02-27 LAB — LIPID PANEL
Cholesterol: 140 mg/dL (ref 0–200)
HDL: 51 mg/dL (ref >40)
LDL Cholesterol: 67 mg/dL (ref 0–99)
Total CHOL/HDL Ratio: 2.7 {ratio}
Triglycerides: 109 mg/dL (ref <150)
VLDL: 22 mg/dL (ref 0–40)

## 2023-02-27 LAB — BASIC METABOLIC PANEL
Anion gap: 11 (ref 5–15)
BUN: 27 mg/dL — ABNORMAL HIGH (ref 8–23)
CO2: 24 mmol/L (ref 22–32)
Calcium: 9 mg/dL (ref 8.9–10.3)
Chloride: 102 mmol/L (ref 98–111)
Creatinine, Ser: 1.45 mg/dL — ABNORMAL HIGH (ref 0.61–1.24)
GFR, Estimated: 51 mL/min — ABNORMAL LOW (ref >60)
Glucose, Bld: 314 mg/dL — ABNORMAL HIGH (ref 70–99)
Potassium: 4.4 mmol/L (ref 3.5–5.1)
Sodium: 137 mmol/L (ref 135–145)

## 2023-02-27 LAB — CBC
HCT: 37.1 % — ABNORMAL LOW (ref 39.0–52.0)
Hemoglobin: 12.2 g/dL — ABNORMAL LOW (ref 13.0–17.0)
MCH: 28.6 pg (ref 26.0–34.0)
MCHC: 32.9 g/dL (ref 30.0–36.0)
MCV: 86.9 fL (ref 80.0–100.0)
Platelets: 159 10*3/uL (ref 150–400)
RBC: 4.27 MIL/uL (ref 4.22–5.81)
RDW: 14.5 % (ref 11.5–15.5)
WBC: 7.4 10*3/uL (ref 4.0–10.5)
nRBC: 0 % (ref 0.0–0.2)

## 2023-02-27 LAB — BRAIN NATRIURETIC PEPTIDE: B Natriuretic Peptide: 159.4 pg/mL — ABNORMAL HIGH (ref 0.0–100.0)

## 2023-02-27 MED ORDER — EPLERENONE 25 MG PO TABS: 25.0000 mg | ORAL_TABLET | Freq: Every day | ORAL | 11 refills | Status: DC

## 2023-02-27 NOTE — Patient Instructions (Signed)
START Eplerenone 25 mg daily.  Labs done today, your results will be available in MyChart, we will contact you for abnormal readings.  Repeat blood work in 10 days..  You have been referred to Nephology. They will call you to arrange your appointment.  Your physician recommends that you schedule a follow-up appointment in: 2 months.  If you have any questions or concerns before your next appointment please send [place] a message through [place] or call our office at [phone].    TO LEAVE A MESSAGE FOR THE NURSE SELECT OPTION 2, PLEASE LEAVE A MESSAGE INCLUDING: YOUR NAME DATE OF BIRTH CALL BACK NUMBER REASON FOR CALL**this is important as we prioritize the call backs  YOU WILL RECEIVE A CALL BACK THE SAME DAY AS LONG AS YOU CALL BEFORE 4:00 PM  At the [facility], you and your health needs are our priority. As part of our continuing mission to provide you with exceptional heart care, we have created designated Provider Care Teams. These Care Teams include your primary Cardiologist (physician) and Advanced Practice Providers (APPs- Physician Assistants and Nurse Practitioners) who all work together to provide you with the care you need, when you need it.   You may see any of the following providers on your designated Care Team at your next follow up: Dr Arvilla Meres Dr [name] Dr. Dorthula Nettles Dr. Clearnce Hasten Amy Filbert Schilder, NP Robbie Lis, [place] [facility] [place], [place] Brynda Peon, NP [place] Lee, NP Karle Plumber, PharmD   Please be sure to bring in all your medications bottles to every appointment.    Thank you for choosing [facility]

## 2023-02-27 NOTE — Progress Notes (Signed)
[facility] Progress Note    Patient ID: Greg Robertson, male   DOB: 1950/09/23, 72 y.o.   MRN: [id number] PCP: Eloisa Northern, MD Cardiology: Dr. Shirlee Latch  72 y.o. with history of HTN, type I diabetes, and paroxysmal atrial fibrillation/CVA was referred by Dr. Frances Furbish for evaluation of cardiomyopathy.  Patient also has had colon cancer with ileocolectomy in 2/13.  In 11/13, he fell and broke his clavicle (tripped).  Soon after this, he was admitted with DKA and profound dehydration/metabolic derangement.  He was noted to be in atrial fibrillation with RVR.  His cardiac enzymes were elevated, troponin peaked at 1.57. He initially went back into NSR but then had recurrent episodes of atrial fibrillation.  He was also started on Eliquis due to risk of embolic CVA, however he subsequently stopped Eliquis.   He was admitted in 6/22 with right PCA CVA and hemorrhagic conversion.  Initially just started on ASA, later this was stopped and Xarelto started.  Main residual from CVA is mild left upper visual field impairment.   CTA neck showed < 50% bilateral carotid stenosis.  Echo showed EF 30-35%, diffuse hypokinesis.  RHC/LHC in 8/22 showed nonobstructive CAD and normal filling pressures.   Cardiac MRI in 11/22 showed LV EF 38%, RVEF 46%, basal inferolateral mid-wall LGE. Diffuse LGE images.  ECV 37-46%.  Based on cMRI, differential included prior myocarditis and cardiac amyloidosis.  PYP scan was done in 2/23, showing grade 1-2, H/CL 1.25.  [employer] gene testing for TTR mutations was negative. Based on full picture, TTR amyloidosis felt unlikely.   Patient had a hypoglycemic episode in 8/24, fell and fractured his ankle.  Echo in 8/24 showed EF 45-50%, normal RV, IVC  normal.   Patient returns for followup of CHF.  He is currently living at a rehab facility. He is non-weight-bearing for 12 weeks due to ankle fracture and is wearing an orthopedic boot. No exertional dyspnea or chest pain  though he is minimally acute due to the ankle fracture.  No orthopnea/PND.  No palpitations.   ECG: NSR with right axis (personally reviewed)  Labs (12/13): TSH normal, HCT 39.1, LFTs normal, K 4.2, creatinine 1.1 Labs (7/22): K 5.6, creatinine 1.17 Labs (8/22): BNP 283, LDL 67, HDL 58, K 5.3, creatinine 9.56 Labs (10/22): K 5.1, creatinine 1.24 Labs (11/22): urine immunofixation negative, K 4.9, creatinine 1.37 Labs (2/23): K 4.1, creatinine 1.33, myeloma panel negative, LDL 68, TGs 58, hgb 12.8 Labs (5/23): K 4.8, creatinine 1.73, BNP 128 Labs (9/23): Hgb 12.3, creatinine 1.68, K 4.7 Labs (1/24): K 5.5, creatinine 1.63, glucose 467 Labs (3/24): LDL 104 Labs (8/24): K 3.7, creatinine 1.47, hgb 10.4  PMH: 1. HTN 2. Type I diabetes 3. Anemia, thrombocytopenia, leukopenia: related to chemotherapy.  4. Clavicular fracture 11/13 due to mechanical fall.  5. 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  FOLFOX chemotherapy 3/13 - 9/13.  7. Allergic rhinitis 8. Paroxysmal atrial fibrillation: First diagnosed in 11/13 during admission for DKA. Echo (11/13): EF 55% with grade I diastolic dysfunction.  9. Right central retinal artery occlusion 10. 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  - CTA neck with <50% carotid stenosis.  11. Cardiomyopathy: Nonischemic cardiomyopathy.  Echo in 6/22 with EF 30-35%, global hypokinesis, normal RV.   - RHC/LHC (8/22): 40% ostial/proximal LAD, D1 50%.  Mean RA 2, PA 29/9, mean PCWP 8, CI 2.94.  - Cardiac MRI (11/22): LV EF 38%, RVEF 46%, basal inferolateral mid-wall LGE. -  PYP (2/23) showing grade 1-2, H/CL 1.25.  The PYP scan was equivocal.  - Echo (8/24): EF 45-50%, normal RV, IVC  normal.  12. COVID-19 2/23 13. OSA 14. CKD stage 3: Diabetic nephropathy  SH: Lives in [place].  Quit smoking in 1995.  H/o marijuana.  Quit drinking ETOH in 2022.    FH: No atrial fibrillation or CAD.   ROS: All systems reviewed and negative  except as per HPI.   Current Outpatient Medications  Medication Sig Dispense Refill   atorvastatin (LIPITOR) 40 MG tablet TAKE ONE TABLET BY MOUTH ONCE DAILY. 90 tablet 3   carvedilol (COREG) 12.5 MG tablet Take 1 tablet (12.5 mg total) by mouth 2 (two) times daily with a meal. Dose change-updated dispill 180 tablet 3   Cholecalciferol 50 MCG (2000 UT) TABS Take 2 tablets by mouth daily.     eplerenone (INSPRA) 25 MG tablet Take 1 tablet (25 mg total) by mouth daily. 30 tablet 11   furosemide (LASIX) 40 MG tablet Take 1 tablet (40 mg total) by mouth daily in the afternoon. 90 tablet 3   GARLIC PO Take 1 capsule by mouth daily.     glucose blood test strip Use as instructed 100 each 12   insulin aspart (NOVOLOG FLEXPEN) 100 UNIT/ML FlexPen INJECT 20-30 UNITS INTO THE SKIN THREE TIMES DAILY WITH MEALS 15 mL 2   Insulin Degludec (TRESIBA [place]) Inject 25 Units into the skin daily.     Insulin Pen Needle 32G X 4 MM MISC Use as directed 150 each 2   Lancets MISC 1 Lancet by Does not apply route 3 (three) times daily. 100 each 3   levocetirizine (XYZAL) 5 MG tablet Take 1 tablet (5 mg total) by mouth daily. 90 tablet 3   Multiple Vitamin (MULTIVITAMIN WITH MINERALS) TABS tablet Take 1 tablet by mouth daily. (0800)     rivaroxaban (XARELTO) 20 MG TABS tablet TAKE ONE TABLET BY MOUTH EVERY EVENING WITH SUPPER 30 tablet 11   ROCKLATAN 0.02-0.005 % SOLN Apply 0.5 drops to eye at bedtime.     sacubitril-valsartan (ENTRESTO) 24-26 MG Take 1 tablet by mouth 2 (two) times daily. 60 tablet 11   sodium zirconium cyclosilicate (LOKELMA) 10 g PACK packet Take 10 g by mouth daily. 90 packet 3   triamcinolone (NASACORT) 55 MCG/ACT AERO nasal inhaler INSTILL 1 SPRAY IN EACH NOSTRIL ONCE OR TWICE DAILY AS NEEDED. 16.5 Bottle 11   vitamin C (ASCORBIC ACID) 500 MG tablet Take 500 mg by mouth in the morning. (0800)     No current facility-administered medications for this encounter.    BP 130/70   Pulse 71   SpO2  98%  PHYSICAL EXAM: General: NAD Neck: No JVD, no thyromegaly or thyroid nodule.  Lungs: Clear to auscultation bilaterally with normal respiratory effort. CV: Nondisplaced PMI.  Heart regular S1/S2, no S3/S4, no murmur.  1+ edema 1/2 to knee on left, boot on right foot.  No carotid bruit.  Normal pedal pulses.  Abdomen: Soft, nontender, no hepatosplenomegaly, no distention.  Skin: Intact without lesions or rashes.  Neurologic: Alert and oriented x 3.  Psych: Normal affect. Extremities: No clubbing or cyanosis.  HEENT: Normal.   Assessment/Plan: 1. Atrial fibrillation:  Paroxysmal. Denies breakthrough symptoms.  He is in NSR today.  - continue Coreg 12.5 mg bid  - Continue Xarelto 20 mg daily. Check CBC today  - If atrial fibrillation recurs frequently, could use anti-arrhythmic versus ablation.  2. Chronic systolic CHF:  Nonischemic cardiomyopathy.  Echo in 6/22 at time of CVA showed EF 30-35%, diffuse hypokinesis.  LHC/RHC in 8/22 showed nonobstructive CAD and normal filling pressures.  Cardiac MRI in 11/22 showed LV EF 38%, RVEF 46%, basal inferolateral mid-wall LGE suggesting possible prior viral myocarditis vs cardiac amyloidosis.  PYP scan was equivocal but likely negative, [employer] gene testing negative for transthyretin mutation, myeloma panel was negative => suspect TTR cardiac amyloidosis is unlikely.  Prior viral myocarditis or a diabetic cardiomyopathy (poor control of blood glucose) are the most likely etiologies.  Echo in 8/24 showed EF 45-50%, normal RV, IVC  normal.  NYHA class ?II symptoms (difficult to discern given immobility from ankle fracture), not volume overloaded by exam.  GDMT titration limited by CKD and soft BP.  - Continue Coreg  12.5 mg bid. BP too soft for dose titration.  - Continue Entresto 24/26 bid.  He is on daily Lokelma 10 g to control K. Check BMET/BNP today.  - Spironolactone was stopped due to hyperkalemia.  I will have him try eplerenone 25 mg daily. BMET  in 10 days.  - No SGLT2 inhibitor with type 1 diabetes.  - Continue Lasix 40 mg daily.  - Compression stocking for left leg (right leg in boot).  - EF out of range for ICD.  Narrow QRS so not CRT candidate.   3. Hyperlipidemia: On atorvastatin 40 mg daily.  - Check lipids today, if LDL is elevated will refer to lipid clinic for Repatha.  4. CAD: Nonobstructive on cath 8/22. No chest pain.  - Continue statin, check lipids today as above.  - No ASA given Xarelto use.  5. Type 1 diabetes: Glucose control seems poor.   - I recommended that he see an endocrinologist.  He does not want to do this.  6. CKD stage 3: Diabetic nephropathy with a tendency towards hyperkalemia.  - Refer to nephrology.    Followup in 3 months with APP.   [name],  02/27/2023

## 2023-03-09 ENCOUNTER — Encounter: Payer: Self-pay | Admitting: Orthopedic Surgery

## 2023-03-09 ENCOUNTER — Ambulatory Visit: Payer: Medicare Other | Admitting: Orthopedic Surgery

## 2023-03-09 ENCOUNTER — Other Ambulatory Visit (INDEPENDENT_AMBULATORY_CARE_PROVIDER_SITE_OTHER): Payer: Self-pay

## 2023-03-09 DIAGNOSIS — S82891A Other fracture of right lower leg, initial encounter for closed fracture: Secondary | ICD-10-CM

## 2023-03-09 DIAGNOSIS — I87331 Chronic venous hypertension (idiopathic) with ulcer and inflammation of right lower extremity: Secondary | ICD-10-CM

## 2023-03-09 DIAGNOSIS — L97309 Non-pressure chronic ulcer of unspecified ankle with unspecified severity: Secondary | ICD-10-CM

## 2023-03-09 DIAGNOSIS — E13622 Other specified diabetes mellitus with other skin ulcer: Secondary | ICD-10-CM

## 2023-03-09 NOTE — Progress Notes (Signed)
Office Visit Note   Patient: Greg Robertson           Date of Birth: 07-06-1950           MRN: [id number] Visit Date: 03/09/2023              Requested by: Eloisa Northern, MD [address] [place],  [place] [address] PCP: Eloisa Northern, MD  Chief Complaint  Patient presents with   Right Ankle - Routine Post Op      HPI: Patient is a 72 year old gentleman who is seen for initial evaluation for traumatic wound medial malleolus right ankle.  Patient is status post a fracture dislocation of the right ankle on August 23.  Patient underwent open reduction internal fixation.  Patient has had good postoperative care and has been nonweightbearing.  Assessment & Plan: Visit Diagnoses:  1. Closed fracture dislocation of right ankle, initial encounter   2. Chronic venous hypertension (idiopathic) with ulcer and inflammation of right lower extremity (HCC)   3. Ankle ulcer due to secondary DM [facility])     Plan: Will plan to start tissue grafting with compression.  Will apply donated Kerecis to the medial malleolus ulcer after debridement.  This was then covered with a 3 layer compression wrap and follow-up on Monday.  Patient has agreed to be involved in the review study for [facility] tissue graft reinforcement.  Will request benefits investigation for Kerecis tissue reinforcement in the outpatient clinic.  Patient will begin physical therapy at his assisted living with progressive ambulation weightbearing as tolerated.    Three-view radiographs of the right ankle at follow-up.  Follow-Up Instructions: Return in about 1 week (around 03/16/2023).   Ortho Exam  Patient is alert, oriented, no adenopathy, well-dressed, normal affect, normal respiratory effort. Examination patient does not have a palpable dorsalis pedis or posterior tibial pulse.  The Doppler was used and he has a strong biphasic dorsalis pedis and posterior tibial pulse.  Patient states that he quit smoking years ago.  States he is a  brittle diabetic with an A1c greater than 8.  Patient has venous insufficiency ulcers over the foot and ankle and has an ulcer over the medial malleolus where the skin was tented from the fracture dislocation.  Patient has full-thickness skin breakdown with a wound that is 3 x 5 cm.  A 10 blade knife was used to debride the skin and soft tissue back to bleeding viable granulation tissue.  This was touched with silver nitrate.  Donated Kerecis micro graft was applied cover with Dynaflex and 3 layer compression wrap.  Hemoglobin A1c in the chart is 9.2.  Imaging: No results found.   Labs: Lab Results  Component Value Date   HGBA1C 9.2 (H) 12/08/2022   HGBA1C 9.9 (H) 10/27/2022   HGBA1C 12.3 (H) 07/21/2022   REPTSTATUS 03/11/2012 FINAL 03/10/2012   CULT NO GROWTH 03/10/2012     Lab Results  Component Value Date   ALBUMIN 3.1 (L) 12/08/2022   ALBUMIN 4.1 04/20/2022   ALBUMIN 3.9 03/24/2022    Lab Results  Component Value Date   MG 1.9 03/13/2012   MG 2.2 03/12/2012   MG 2.1 03/10/2012   Lab Results  Component Value Date   VD25OH 10.7 (L) 05/24/2018   VD25OH 14.1 (L) 01/17/2018   VD25OH 14.7 (L) 09/13/2017    No results found for: "PREALBUMIN"    Latest Ref Rng & Units 02/27/2023   12:35 PM 12/12/2022    4:59  AM 12/10/2022    5:39 AM  CBC EXTENDED  WBC 4.0 - 10.5 K/uL 7.4  6.9  9.5   RBC 4.22 - 5.81 MIL/uL 4.27  3.56  3.66   Hemoglobin 13.0 - 17.0 g/dL 16.1  09.6  04.5   HCT 39.0 - 52.0 % 37.1  30.7  33.1   Platelets 150 - 400 K/uL 159  156  126      There is no height or weight on file to calculate BMI.  Orders:  Orders Placed This Encounter  Procedures   XR Ankle Complete Right   No orders of the defined types were placed in this encounter.    Procedures: No procedures performed  Clinical Data: No additional findings.  ROS:  All other systems negative, except as noted in the HPI. Review of Systems  Objective: Vital Signs: There were no vitals taken  for this visit.  Specialty Comments:  No specialty comments available.  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   Mixed hyperlipidemia 08/22/2022   Chronic insomnia 11/24/2021   Retinopathy 11/24/2021   Central retinal vein occlusion, right eye, stable 10/25/2021   History of CVA (cerebrovascular accident) 09/27/2020   A-fib (HCC) 09/27/2020   Left homonymous hemianopsia 09/27/2020   COPD (chronic obstructive pulmonary disease) (HCC) 09/05/2017   Aortic atherosclerosis (HCC) 09/04/2017   Benign prostatic hyperplasia 09/04/2017   Hypertension, essential 03/14/2012   Chronic CHF (congestive heart failure) (HCC) 03/12/2012   Lynch syndrome 07/28/2011   History of colon cancer 05/25/2011   DM type 1 (diabetes mellitus, type 1) (HCC) 05/10/2011   Glaucoma 05/10/2011   Past Medical History:  Diagnosis Date   Allergic rhinitis    Anemia    Atrial fibrillation (HCC)    Persistent   Cancer of ascending colon, 7cm 05/25/2011   Congestive heart failure (CHF) (HCC)    Diabetes mellitus type I (HCC)    Glaucoma    Hypertension    Pneumonia 1979 or 1980   Prostate nodule    Substance abuse (HCC)     Family History  Problem Relation Age of Onset   Breast cancer Mother    Pancreatitis Mother        intestinal adhesions   Insomnia Mother    Colon polyps Father    Lung cancer Father    Diabetes Father    Prostate cancer Father    Colon cancer Neg Hx    Rectal cancer Neg Hx    Esophageal cancer Neg Hx    Inflammatory bowel disease Neg Hx    Liver disease Neg Hx    Pancreatic cancer Neg Hx    Stomach cancer Neg Hx     Past Surgical History:  Procedure Laterality Date   broken left shoulder blade and collar bone     COLONOSCOPY WITH PROPOFOL N/A  04/29/2021   Procedure: COLONOSCOPY WITH PROPOFOL;  Surgeon: Rachael Fee, MD;  Location: [facility] END[facility]COPY;  Service: Endoscopy;  Laterality: N/A;   ESOPHAGOGASTRODUODENOSCOPY (EGD) WITH PROPOFOL N/A 04/29/2021   Procedure: ESOPHAGOGASTRODUODENOSCOPY (EGD) WITH PROPOFOL;  Surgeon: Rachael Fee, MD;  Location: [facility] END[facility];  Service: Endoscopy;  Laterality: N/A;   FINGER SURGERY  2009   right middle   HEMOSTASIS CLIP PLACEMENT  04/29/2021   Procedure: HEMOSTASIS CLIP  PLACEMENT;  Surgeon: Rachael Fee, MD;  Location: [facility];  Service: Endoscopy;;   LAPAROSCOPIC ASSISTED 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     NASAL FRACTURE SURGERY  1968   ORIF ANKLE FRACTURE Right 12/08/2022   Procedure: OPEN REDUCTION INTERNAL FIXATION (ORIF) ANKLE FRACTURE;  Surgeon: London Sheer, MD;  Location: [facility] OR;  Service: Orthopedics;  Laterality: Right;   POLYPECTOMY  04/29/2021   Procedure: POLYPECTOMY;  Surgeon: Rachael Fee, MD;  Location: [facility];  Service: Endoscopy;;   PORTACATH PLACEMENT  07/01/2011   Procedure: INSERTION PORT-A-CATH;  Surgeon: Ardeth Sportsman, MD;  Location: [facility] ORS;  Service: General;  Laterality: Left;  Insertion of Port-A-Catheter Left Internal Jugular   RIGHT/LEFT HEART CATH AND CORONARY ANGIOGRAPHY N/A 12/08/2020   Procedure: RIGHT/LEFT HEART CATH AND CORONARY ANGIOGRAPHY;  Surgeon: Laurey Morale, MD;  Location: [facility] INVASIVE CV LAB;  Service: Cardiovascular;  Laterality: N/A;   TONSILLECTOMY  1957 - approximate   Social History   Occupational History   Occupation: Retired    Comment: Airline pilot  Tobacco Use   Smoking status: Former    Current packs/day: 0.00    Average packs/day: 1 pack/day for 15.0 years (15.0 ttl pk-yrs)    Types: Cigarettes    Start date: 04/18/1978    Quit date: 04/18/1993    Years since quitting: 29.9   Smokeless tobacco: Never   Tobacco comments:    marijuana every night   Vaping Use   Vaping status: Never Used  Substance and  Sexual Activity   Alcohol use: Not Currently    Alcohol/week: 5.0 standard drinks of alcohol    Types: 3 Cans of beer, 2 Shots of liquor per week    Comment: 1 drink every 2 days   Drug use: Not Currently    Types: Marijuana    Comment: once a night marijuana   Sexual activity: Not Currently

## 2023-03-13 ENCOUNTER — Ambulatory Visit: Payer: Medicare Other | Admitting: Orthopedic Surgery

## 2023-03-13 ENCOUNTER — Telehealth: Payer: Self-pay

## 2023-03-13 ENCOUNTER — Other Ambulatory Visit (INDEPENDENT_AMBULATORY_CARE_PROVIDER_SITE_OTHER): Payer: Medicare Other

## 2023-03-13 DIAGNOSIS — S82891A Other fracture of right lower leg, initial encounter for closed fracture: Secondary | ICD-10-CM | POA: Diagnosis not present

## 2023-03-13 DIAGNOSIS — I87331 Chronic venous hypertension (idiopathic) with ulcer and inflammation of right lower extremity: Secondary | ICD-10-CM | POA: Diagnosis not present

## 2023-03-13 DIAGNOSIS — E13622 Other specified diabetes mellitus with other skin ulcer: Secondary | ICD-10-CM

## 2023-03-13 DIAGNOSIS — L97309 Non-pressure chronic ulcer of unspecified ankle with unspecified severity: Secondary | ICD-10-CM

## 2023-03-13 NOTE — Telephone Encounter (Signed)
Jessica Nurse HHPT at [facility]  called and states she had left a message last time and nothing ever got relayed to Dr.  She states patient is living in an assisted facility ([facility]). They cannot provide/manage any wound care. Only skilled facilities can. HH cannot do daily wet to dry -ins will not cover. Also states patient cannot have a wound vac at the facility.  States patient has an appt this PM and wanted everyone to be on the same page. Shanda Bumps would like a CB to discuss further.   Shanda Bumps [phone] Greg Robertson [phone]

## 2023-03-14 ENCOUNTER — Telehealth: Payer: Self-pay | Admitting: Orthopedic Surgery

## 2023-03-14 NOTE — Telephone Encounter (Signed)
Faxed order for wound care. Currently pt is in a multi layer wrap for one week and when he returns on 03/20/23 this will be taken off for eval. Kerecis graft was put on underneath the wrap.

## 2023-03-14 NOTE — Telephone Encounter (Signed)
Order given to pt at visit yesterday for PT. No wound care needed right now. We are doing weekly compression wraps on right leg. Sent in an order for "wound orders" to their fax number at [facility]. See other message regarding this. Pt does not have a wound vac on. Patient is not requiring wet to dry dressings. He was previously seeing Dr. Christell Constant and we have adopted him for wound care. Wet to dry is what Dr. Christell Constant had suggested at another time.

## 2023-03-14 NOTE — Telephone Encounter (Signed)
Jessica from [facility] called needing clarification if you wanted them to do wound care. They need orders faxed over. They need to know what is the care plan. He also have home health as well. The fax# [phone] and CB#[phone]

## 2023-03-20 ENCOUNTER — Ambulatory Visit: Payer: Medicare Other | Admitting: Family Medicine

## 2023-03-20 ENCOUNTER — Ambulatory Visit (INDEPENDENT_AMBULATORY_CARE_PROVIDER_SITE_OTHER): Payer: Medicare Other | Admitting: Orthopedic Surgery

## 2023-03-20 DIAGNOSIS — L97301 Non-pressure chronic ulcer of unspecified ankle limited to breakdown of skin: Secondary | ICD-10-CM | POA: Diagnosis not present

## 2023-03-20 DIAGNOSIS — I87331 Chronic venous hypertension (idiopathic) with ulcer and inflammation of right lower extremity: Secondary | ICD-10-CM

## 2023-03-20 DIAGNOSIS — E13622 Other specified diabetes mellitus with other skin ulcer: Secondary | ICD-10-CM | POA: Diagnosis not present

## 2023-03-20 DIAGNOSIS — S82891A Other fracture of right lower leg, initial encounter for closed fracture: Secondary | ICD-10-CM

## 2023-03-21 ENCOUNTER — Encounter: Payer: Self-pay | Admitting: Orthopedic Surgery

## 2023-03-21 NOTE — Progress Notes (Signed)
Office Visit Note   Patient: Greg Robertson           Date of Birth: [birth year]           MRN: [id number] Visit Date: 03/20/2023              Requested by: Dettinger, Elige Radon, MD [address] [place],  [place] [address] PCP: Dettinger, Elige Radon, MD  Chief Complaint  Patient presents with   Right Ankle - Wound Check      HPI: Patient is a 72 year old gentleman who is seen in follow-up for right ankle medial malleolus ulcer.  Patient is status post fracture dislocation with internal fixation of the fibula medial malleolus and syndesmosis.  The ulcerative area is the location where the skin was tender on the medial malleolus.    Assessment & Plan: Visit Diagnoses:  1. Closed fracture dislocation of right ankle, initial encounter   2. Chronic venous hypertension (idiopathic) with ulcer and inflammation of right lower extremity (HCC)   3. 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  Will apply sample of tissue graft.  Paperwork was completed for the assisted living facility stating that no dressing changes needed to be performed that we would change this dressing in the office.  Recommended physical therapy progressive ambulation weightbearing as tolerated in the fracture boot.  No restrictions with ambulation.  Discussed that if there are any questions not answered on the form that the assisted living facility can call us.  Follow-Up Instructions: Return in about 1 week (around 03/27/2023).   Ortho Exam  Patient is alert, oriented, no adenopathy, well-dressed, normal affect, normal respiratory effort. Examination of the medial malleolar ulcer is 1 cm in diameter with healthy granulation tissue over 75% of the wound.  There is some ischemic fascia.  Kerecis micro graft was applied to the wound plus a Dynaflex compression wrap.  Imaging: No results found.    Labs: Lab Results  Component Value Date    HGBA1C 9.2 (H) 12/08/2022   HGBA1C 9.9 (H) 10/27/2022   HGBA1C 12.3 (H) 07/21/2022   REPTSTATUS 03/11/2012 FINAL 03/10/2012   CULT NO GROWTH 03/10/2012     Lab Results  Component Value Date   ALBUMIN 3.1 (L) 12/08/2022   ALBUMIN 4.1 04/20/2022   ALBUMIN 3.9 03/24/2022    Lab Results  Component Value Date   MG 1.9 03/13/2012   MG 2.2 03/12/2012   MG 2.1 03/10/2012   Lab Results  Component Value Date   VD25OH 10.7 (L) 05/24/2018   VD25OH 14.1 (L) 01/17/2018   VD25OH 14.7 (L) 09/13/2017    No results found for: "PREALBUMIN"    Latest Ref Rng & Units 02/27/2023   12:35 PM 12/12/2022    4:59 AM 12/10/2022    5:39 AM  CBC EXTENDED  WBC 4.0 - 10.5 K/uL 7.4  6.9  9.5   RBC 4.22 - 5.81 MIL/uL 4.27  3.56  3.66   Hemoglobin 13.0 - 17.0 g/dL 33.2  95.1  88.4   HCT 39.0 - 52.0 % 37.1  30.7  33.1   Platelets 150 - 400 K/uL 159  156  126      There is no height or weight on file to calculate BMI.  Orders:  No orders of the defined types were placed in this encounter.  No orders of the defined types were placed in this encounter.  Procedures: No procedures performed  Clinical Data: No additional findings.  ROS:  All other systems negative, except as noted in the HPI. Review of Systems  Objective: Vital Signs: There were no vitals taken for this visit.  Specialty Comments:  No specialty comments available.  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   Mixed hyperlipidemia 08/22/2022   Chronic insomnia 11/24/2021   Retinopathy 11/24/2021   Central retinal vein occlusion, right eye, stable 10/25/2021   History of CVA (cerebrovascular accident) 09/27/2020   A-fib (HCC) 09/27/2020   Left homonymous hemianopsia 09/27/2020   COPD (chronic  obstructive pulmonary disease) (HCC) 09/05/2017   Aortic atherosclerosis (HCC) 09/04/2017   Benign prostatic hyperplasia 09/04/2017   Hypertension, essential 03/14/2012   Chronic CHF (congestive heart failure) (HCC) 03/12/2012   Lynch syndrome 07/28/2011   History of colon cancer 05/25/2011   DM type 1 (diabetes mellitus, type 1) (HCC) 05/10/2011   Glaucoma 05/10/2011   Past Medical History:  Diagnosis Date   Allergic rhinitis    Anemia    Atrial fibrillation (HCC)    Persistent   Cancer of ascending colon, 7cm 05/25/2011   Congestive heart failure (CHF) (HCC)    Diabetes mellitus type I (HCC)    Glaucoma    Hypertension    Pneumonia 1979 or 1980   Prostate nodule    Substance abuse (HCC)     Family History  Problem Relation Age of Onset   Breast cancer Mother    Pancreatitis Mother        intestinal adhesions   Insomnia Mother    Colon polyps Father    Lung cancer Father    Diabetes Father    Prostate cancer Father    Colon cancer Neg Hx    Rectal cancer Neg Hx    Esophageal cancer Neg Hx    Inflammatory bowel disease Neg Hx    Liver disease Neg Hx    Pancreatic cancer Neg Hx    Stomach cancer Neg Hx     Past Surgical History:  Procedure Laterality Date   broken left shoulder blade and collar bone     COLONOSCOPY WITH PROPOFOL N/A 04/29/2021   Procedure: COLONOSCOPY WITH PROPOFOL;  Surgeon: Rachael Fee, MD;  Location: [facility];  Service: Endoscopy;  Laterality: N/A;   ESOPHAGOGASTRODUODENOSCOPY (EGD) WITH PROPOFOL N/A 04/29/2021   Procedure: ESOPHAGOGASTRODUODENOSCOPY (EGD) WITH PROPOFOL;  Surgeon: Rachael Fee, MD;  [facility];  Service: Endoscopy;  Laterality: N/A;   FINGER SURGERY  2009   right middle   HEMOSTASIS CLIP PLACEMENT  04/29/2021   Procedure: HEMOSTASIS CLIP PLACEMENT;  Surgeon: Rachael Fee, MD;  [facility];  Service: Endoscopy;;   LAPAROSCOPIC ASSISTED 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     NASAL  FRACTURE SURGERY  1968   ORIF ANKLE FRACTURE Right 12/08/2022   Procedure: OPEN REDUCTION INTERNAL FIXATION (ORIF) ANKLE FRACTURE;  Surgeon: London Sheer, MD;  [facility]: [facility];  Service: Orthopedics;  Laterality: Right;   POLYPECTOMY  04/29/2021   Procedure: POLYPECTOMY;  Surgeon: Rachael Fee, MD;  [facility];  Service: Endoscopy;;   PORTACATH PLACEMENT  07/01/2011   Procedure: INSERTION PORT-A-CATH;  Surgeon: Ardeth Sportsman, MD;  Location: [facility];  Service: General;  Laterality: Left;  Insertion  of Port-A-Catheter Left Internal Jugular   RIGHT/LEFT HEART CATH AND CORONARY ANGIOGRAPHY N/A 12/08/2020   Procedure: RIGHT/LEFT HEART CATH AND CORONARY ANGIOGRAPHY;  Surgeon: Laurey Morale, MD;  Location: [facility] INVASIVE CV LAB;  Service: Cardiovascular;  Laterality: N/A;   TONSILLECTOMY  1957 - approximate   Social History   Occupational History   Occupation: Retired    Comment: Airline pilot  Tobacco Use   Smoking status: Former    Current packs/day: 0.00    Average packs/day: 1 pack/day for 15.0 years (15.0 ttl pk-yrs)    Types: Cigarettes    Start date: 04/18/1978    Quit date: 04/18/1993    Years since quitting: 29.9   Smokeless tobacco: Never   Tobacco comments:    marijuana every night   Vaping Use   Vaping status: Never Used  Substance and Sexual Activity   Alcohol use: Not Currently    Alcohol/week: 5.0 standard drinks of alcohol    Types: 3 Cans of beer, 2 Shots of liquor per week    Comment: 1 drink every 2 days   Drug use: Not Currently    Types: Marijuana    Comment: once a night marijuana   Sexual activity: Not Currently

## 2023-03-21 NOTE — Progress Notes (Signed)
Office Visit Note   Patient: Greg Robertson           Date of Birth: Aug 19, 1950           MRN: [id number] Visit Date: 03/13/2023              Requested by: Eloisa Northern, MD [address] [place],  [place] [address] PCP: Dettinger, Elige Radon, MD  Chief Complaint  Patient presents with   Right Ankle - Wound Check    S/p kerecis graft application last week        HPI: Patient is a 72 year old gentleman who is seen in follow-up for traumatic medial malleolar ulcer right ankle status post open reduction internal fixation of bimalleolar ankle fracture with syndesmosis injury.  Patient sustained an ulcer medially where the medial malleolus caused pressure on the skin.  Patient states that he has no pain at this time he has been weightbearing without the boot.  He states he is working on range of motion.  Kerecis micro graft that was donated was applied on November 21 with a compression wrap.  Assessment & Plan: Visit Diagnoses:  1. Closed fracture dislocation of right ankle, initial encounter     Plan: Donated tissue graft is applied with a compression wrap.  Also recommended elevation.  Patient was provided a note to begin physical therapy progressive ambulation weightbearing as tolerated at his assisted living.  Follow-Up Instructions: Return in about 1 week (around 03/20/2023).   Ortho Exam  Patient is alert, oriented, no adenopathy, well-dressed, normal affect, normal respiratory effort. Examination the ulcer is 3 x 2 cm and 3 mm deep.  Radiograph shows stable internal fixation with calcified arteries at the ankle.  Patient has a large eschar that was debrided and a hematoma was expressed from beneath the eschar.  There is approximately 50% granulation tissue after debridement.  Kerecis micro graft was applied plus a compression wrap.  The wound healing has stalled, the wound bed has healthy granulation tissue, and patient presents for evaluation and application of Kerecis [employer]  Micro graft. After informed consent a 10 blade knife was used to debride the skin and soft tissue to healthy viable bleeding granulation tissue.  Silver nitrate was used for hemostasis. The wound measures: 3 cm in length, 2cm  in width, 1 mm in depth, wound location right ankle Kerecis Mari[employer] micro tissue graft 2 cm2 was applied, and there was no wastage.  Please see the photo below of the Lot number and expiration date. The micro tissue graft was covered with a nonadherent Adaptic dressing, bolstered with 4 x 4 gauze and secured with a compression wrap.     Imaging: No results found.      Labs: Lab Results  Component Value Date   HGBA1C 9.2 (H) 12/08/2022   HGBA1C 9.9 (H) 10/27/2022   HGBA1C 12.3 (H) 07/21/2022   REPTSTATUS 03/11/2012 FINAL 03/10/2012   CULT NO GROWTH 03/10/2012     Lab Results  Component Value Date   ALBUMIN 3.1 (L) 12/08/2022   ALBUMIN 4.1 04/20/2022   ALBUMIN 3.9 03/24/2022    Lab Results  Component Value Date   MG 1.9 03/13/2012   MG 2.2 03/12/2012   MG 2.1 03/10/2012   Lab Results  Component Value Date   VD25OH 10.7 (L) 05/24/2018   VD25OH 14.1 (L) 01/17/2018   VD25OH 14.7 (L) 09/13/2017    No results found for: "PREALBUMIN"    Latest Ref Rng & Units 02/27/2023  12:35 PM 12/12/2022    4:59 AM 12/10/2022    5:39 AM  CBC EXTENDED  WBC 4.0 - 10.5 K/uL 7.4  6.9  9.5   RBC 4.22 - 5.81 MIL/uL 4.27  3.56  3.66   Hemoglobin 13.0 - 17.0 g/dL 54.0  98.1  19.1   HCT 39.0 - 52.0 % 37.1  30.7  33.1   Platelets 150 - 400 K/uL 159  156  126      There is no height or weight on file to calculate BMI.  Orders:  Orders Placed This Encounter  Procedures   XR Ankle Complete Right   No orders of the defined types were placed in this encounter.    Procedures: No procedures performed  Clinical Data: No additional findings.  ROS:  All other systems negative, except as noted in the HPI. Review of Systems  Objective: Vital Signs: There  were no vitals taken for this visit.  Specialty Comments:  No specialty comments available.  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   Mixed hyperlipidemia 08/22/2022   Chronic insomnia 11/24/2021   Retinopathy 11/24/2021   Central retinal vein occlusion, right eye, stable 10/25/2021   History of CVA (cerebrovascular accident) 09/27/2020   A-fib (HCC) 09/27/2020   Left homonymous hemianopsia 09/27/2020   COPD (chronic obstructive pulmonary disease) (HCC) 09/05/2017   Aortic atherosclerosis (HCC) 09/04/2017   Benign prostatic hyperplasia 09/04/2017   Hypertension, essential 03/14/2012   Chronic CHF (congestive heart failure) (HCC) 03/12/2012   Lynch syndrome 07/28/2011   History of colon cancer 05/25/2011   DM type 1 (diabetes mellitus, type 1) (HCC) 05/10/2011   Glaucoma 05/10/2011   Past Medical History:  Diagnosis Date   Allergic rhinitis    Anemia    Atrial fibrillation (HCC)    Persistent   Cancer of ascending colon, 7cm 05/25/2011   Congestive heart failure (CHF) (HCC)    Diabetes mellitus type I (HCC)    Glaucoma    Hypertension    Pneumonia 1979 or 1980   Prostate nodule    Substance abuse (HCC)     Family History  Problem Relation Age of Onset   Breast cancer Mother    Pancreatitis Mother        intestinal adhesions   Insomnia Mother    Colon polyps Father    Lung cancer Father    Diabetes Father    Prostate cancer Father    Colon cancer Neg Hx    Rectal cancer Neg Hx    Esophageal cancer Neg Hx    Inflammatory bowel disease Neg Hx    Liver disease Neg Hx    Pancreatic cancer Neg Hx    Stomach cancer Neg Hx     Past Surgical History:  Procedure Laterality Date   broken left shoulder blade and collar bone     COLONOSCOPY  WITH PROPOFOL N/A 04/29/2021   Procedure: COLONOSCOPY WITH PROPOFOL;  Surgeon: Rachael Fee, MD;  [facility];  Service: Endoscopy;  Laterality: N/A;   ESOPHAGOGASTRODUODENOSCOPY (EGD) WITH PROPOFOL N/A 04/29/2021   Procedure: ESOPHAGOGASTRODUODENOSCOPY (EGD) WITH PROPOFOL;  Surgeon: Rachael Fee, MD;  [facility];  Service: Endoscopy;  Laterality: N/A;   FINGER SURGERY  2009   right middle   HEMOSTASIS CLIP PLACEMENT  04/29/2021   Procedure: HEMOSTASIS CLIP PLACEMENT;  Surgeon: Rachael Fee, MD;  Location: [facility];  Service: Endoscopy;;   LAPAROSCOPIC ASSISTED 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     NASAL FRACTURE SURGERY  1968   ORIF ANKLE FRACTURE Right 12/08/2022   Procedure: OPEN REDUCTION INTERNAL FIXATION (ORIF) ANKLE FRACTURE;  Surgeon: London Sheer, MD;  Location: [facility];  Service: Orthopedics;  Laterality: Right;   POLYPECTOMY  04/29/2021   Procedure: POLYPECTOMY;  Surgeon: Rachael Fee, MD;  [facility];  Service: Endoscopy;;   PORTACATH PLACEMENT  07/01/2011   Procedure: INSERTION PORT-A-CATH;  Surgeon: Ardeth Sportsman, MD;  [facility];  Service: General;  Laterality: Left;  Insertion of Port-A-Catheter Left Internal Jugular   RIGHT/LEFT HEART CATH AND CORONARY ANGIOGRAPHY N/A 12/08/2020   Procedure: RIGHT/LEFT HEART CATH AND CORONARY ANGIOGRAPHY;  Surgeon: Laurey Morale, MD;  Location: [facility] INVASIVE CV LAB;  Service: Cardiovascular;  Laterality: N/A;   TONSILLECTOMY  1957 - approximate   Social History   Occupational History   Occupation: Retired    Comment: Airline pilot  Tobacco Use   Smoking status: Former    Current packs/day: 0.00    Average packs/day: 1 pack/day for 15.0 years (15.0 ttl pk-yrs)    Types: Cigarettes    Start date: 04/18/1978    Quit date: 04/18/1993    Years since quitting: 29.9   Smokeless tobacco: Never   Tobacco comments:    marijuana every night   Vaping Use   Vaping status: Never Used   Substance and Sexual Activity   Alcohol use: Not Currently    Alcohol/week: 5.0 standard drinks of alcohol    Types: 3 Cans of beer, 2 Shots of liquor per week    Comment: 1 drink every 2 days   Drug use: Not Currently    Types: Marijuana    Comment: once a night marijuana   Sexual activity: Not Currently

## 2023-03-27 ENCOUNTER — Ambulatory Visit (INDEPENDENT_AMBULATORY_CARE_PROVIDER_SITE_OTHER): Payer: Medicare Other | Admitting: Orthopedic Surgery

## 2023-03-27 DIAGNOSIS — S82891A Other fracture of right lower leg, initial encounter for closed fracture: Secondary | ICD-10-CM | POA: Diagnosis not present

## 2023-03-27 DIAGNOSIS — I87331 Chronic venous hypertension (idiopathic) with ulcer and inflammation of right lower extremity: Secondary | ICD-10-CM | POA: Diagnosis not present

## 2023-03-27 DIAGNOSIS — E13622 Other specified diabetes mellitus with other skin ulcer: Secondary | ICD-10-CM | POA: Diagnosis not present

## 2023-03-27 DIAGNOSIS — L97309 Non-pressure chronic ulcer of unspecified ankle with unspecified severity: Secondary | ICD-10-CM | POA: Diagnosis not present

## 2023-03-27 MED ORDER — DOXYCYCLINE HYCLATE 100 MG PO TABS
100.0000 mg | ORAL_TABLET | Freq: Two times a day (BID) | ORAL | 0 refills | Status: DC
Start: 2023-03-27 — End: 2023-05-23

## 2023-03-28 ENCOUNTER — Encounter: Payer: Self-pay | Admitting: Orthopedic Surgery

## 2023-03-28 NOTE — Progress Notes (Signed)
Office Visit Note   Patient: Greg Robertson           Date of Birth: January 17, 1951           MRN: [id number] Visit Date: 03/27/2023              Requested by: Dettinger, Elige Radon, MD [address] [place],  [place] [address] PCP: Dettinger, Elige Radon, MD  Chief Complaint  Patient presents with   Right Ankle - Wound Check      HPI: Patient is a 72 year old gentleman is seen in follow-up for tissue graft for right ankle ulcer.  Ulcer secondary to skin trauma secondary to the fracture dislocation of the right ankle that underwent open reduction internal fixation of the syndesmosis as well as medial and lateral malleolus.  Patient is currently living in assisted living he states that he cannot do wound care on his own and the facility cannot provide him with wound care.  Patient is currently provided with physical therapy.  Assessment & Plan: Visit Diagnoses:  1. Closed fracture dislocation of right ankle, initial encounter   2. Chronic venous hypertension (idiopathic) with ulcer and inflammation of right lower extremity (HCC)   3. 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  Patient is placed in a multilayer compression wrap and a prescription for doxycycline.  Follow-Up Instructions: Return in about 1 week (around 04/03/2023).   Ortho Exam  Patient is alert, oriented, no adenopathy, well-dressed, normal affect, normal respiratory effort. Examination patient does have improved granulation tissue.  There is increased swelling of the foot and discussed the importance of elevating his foot above his heart and the importance of exercise.  Ulcer was debrided of fibrinous exudative tissue and silver nitrate used for hemostasis.  Patient has increased periwound maceration from the swelling from his leg being  dependent.  A multilayer compression wrap was applied today.  Imaging: No results found.   Labs: Lab Results  Component Value Date   HGBA1C 9.2 (H) 12/08/2022   HGBA1C 9.9 (H) 10/27/2022   HGBA1C 12.3 (H) 07/21/2022   REPTSTATUS 03/11/2012 FINAL 03/10/2012   CULT NO GROWTH 03/10/2012     Lab Results  Component Value Date   ALBUMIN 3.1 (L) 12/08/2022   ALBUMIN 4.1 04/20/2022   ALBUMIN 3.9 03/24/2022    Lab Results  Component Value Date   MG 1.9 03/13/2012   MG 2.2 03/12/2012   MG 2.1 03/10/2012   Lab Results  Component Value Date   VD25OH 10.7 (L) 05/24/2018   VD25OH 14.1 (L) 01/17/2018   VD25OH 14.7 (L) 09/13/2017    No results found for: "PREALBUMIN"    Latest Ref Rng & Units 02/27/2023   12:35 PM 12/12/2022    4:59 AM 12/10/2022    5:39 AM  CBC EXTENDED  WBC 4.0 - 10.5 K/uL 7.4  6.9  9.5   RBC 4.22 - 5.81 MIL/uL 4.27  3.56  3.66   Hemoglobin 13.0 - 17.0 g/dL 60.1  09.3  23.5   HCT 39.0 - 52.0 % 37.1  30.7  33.1   Platelets 150 - 400 K/uL 159  156  126      There is no height or weight on file to calculate BMI.  Orders:  No orders of the defined types  were placed in this encounter.  Meds ordered this encounter  Medications   doxycycline (VIBRA-TABS) 100 MG tablet    Sig: Take 1 tablet (100 mg total) by mouth 2 (two) times daily.    Dispense:  30 tablet    Refill:  0     Procedures: No procedures performed  Clinical Data: No additional findings.  ROS:  All other systems negative, except as noted in the HPI. Review of Systems  Objective: Vital Signs: There were no vitals taken for this visit.  Specialty Comments:  No specialty comments available.  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   Mixed hyperlipidemia  08/22/2022   Chronic insomnia 11/24/2021   Retinopathy 11/24/2021   Central retinal vein occlusion, right eye, stable 10/25/2021   History of CVA (cerebrovascular accident) 09/27/2020   A-fib (HCC) 09/27/2020   Left homonymous hemianopsia 09/27/2020   COPD (chronic obstructive pulmonary disease) (HCC) 09/05/2017   Aortic atherosclerosis (HCC) 09/04/2017   Benign prostatic hyperplasia 09/04/2017   Hypertension, essential 03/14/2012   Chronic CHF (congestive heart failure) (HCC) 03/12/2012   Lynch syndrome 07/28/2011   History of colon cancer 05/25/2011   DM type 1 (diabetes mellitus, type 1) (HCC) 05/10/2011   Glaucoma 05/10/2011   Past Medical History:  Diagnosis Date   Allergic rhinitis    Anemia    Atrial fibrillation (HCC)    Persistent   Cancer of ascending colon, 7cm 05/25/2011   Congestive heart failure (CHF) (HCC)    Diabetes mellitus type I (HCC)    Glaucoma    Hypertension    Pneumonia 1979 or 1980   Prostate nodule    Substance abuse (HCC)     Family History  Problem Relation Age of Onset   Breast cancer Mother    Pancreatitis Mother        intestinal adhesions   Insomnia Mother    Colon polyps Father    Lung cancer Father    Diabetes Father    Prostate cancer Father    Colon cancer Neg Hx    Rectal cancer Neg Hx    Esophageal cancer Neg Hx    Inflammatory bowel disease Neg Hx    Liver disease Neg Hx    Pancreatic cancer Neg Hx    Stomach cancer Neg Hx     Past Surgical History:  Procedure Laterality Date   broken left shoulder blade and collar bone     COLONOSCOPY WITH PROPOFOL N/A 04/29/2021   Procedure: COLONOSCOPY WITH PROPOFOL;  Surgeon: Rachael Fee, MD;  [facility];  Service: Endoscopy;  Laterality: N/A;   ESOPHAGOGASTRODUODENOSCOPY (EGD) WITH PROPOFOL N/A 04/29/2021   Procedure: ESOPHAGOGASTRODUODENOSCOPY (EGD) WITH PROPOFOL;  Surgeon: Rachael Fee, MD;  [facility];  Service: Endoscopy;  Laterality: N/A;   FINGER  SURGERY  2009   right middle   HEMOSTASIS CLIP PLACEMENT  04/29/2021   Procedure: HEMOSTASIS CLIP PLACEMENT;  Surgeon: Rachael Fee, MD;  [facility];  Service: Endoscopy;;   LAPAROSCOPIC ASSISTED 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     NASAL FRACTURE SURGERY  1968   ORIF ANKLE FRACTURE Right 12/08/2022   Procedure: OPEN REDUCTION INTERNAL FIXATION (ORIF) ANKLE FRACTURE;  Surgeon: London Sheer, MD;  Location: [facility];  Service: Orthopedics;  Laterality:  Right;   POLYPECTOMY  04/29/2021   Procedure: POLYPECTOMY;  Surgeon: Rachael Fee, MD;  Location: [facility];  Service: Endoscopy;;   PORTACATH PLACEMENT  07/01/2011   Procedure: INSERTION PORT-A-CATH;  Surgeon: Ardeth Sportsman, MD;  Location[facility];  Service: General;  Laterality: Left;  Insertion of Port-A-Catheter Left Internal Jugular   RIGHT/LEFT HEART CATH AND CORONARY ANGIOGRAPHY N/A 12/08/2020   Procedure: RIGHT/LEFT HEART CATH AND CORONARY ANGIOGRAPHY;  Surgeon: Laurey Morale, MD;  Location: [facility] INVASIVE CV LAB;  Service: Cardiovascular;  Laterality: N/A;   TONSILLECTOMY  1957 - approximate   Social History   Occupational History   Occupation: Retired    Comment: Airline pilot  Tobacco Use   Smoking status: Former    Current packs/day: 0.00    Average packs/day: 1 pack/day for 15.0 years (15.0 ttl pk-yrs)    Types: Cigarettes    Start date: 04/18/1978    Quit date: 04/18/1993    Years since quitting: 29.9   Smokeless tobacco: Never   Tobacco comments:    marijuana every night   Vaping Use   Vaping status: Never Used  Substance and Sexual Activity   Alcohol use: Not Currently    Alcohol/week: 5.0 standard drinks of alcohol    Types: 3 Cans of beer, 2 Shots of liquor per week    Comment: 1 drink every 2 days   Drug use: Not Currently    Types: Marijuana    Comment: once a night marijuana   Sexual activity: Not Currently

## 2023-03-31 ENCOUNTER — Other Ambulatory Visit (HOSPITAL_COMMUNITY): Payer: Self-pay

## 2023-04-03 ENCOUNTER — Ambulatory Visit (INDEPENDENT_AMBULATORY_CARE_PROVIDER_SITE_OTHER): Payer: Medicare Other | Admitting: Orthopedic Surgery

## 2023-04-03 DIAGNOSIS — E13622 Other specified diabetes mellitus with other skin ulcer: Secondary | ICD-10-CM

## 2023-04-03 DIAGNOSIS — L97309 Non-pressure chronic ulcer of unspecified ankle with unspecified severity: Secondary | ICD-10-CM

## 2023-04-09 ENCOUNTER — Encounter: Payer: Self-pay | Admitting: Orthopedic Surgery

## 2023-04-09 NOTE — Progress Notes (Signed)
Office Visit Note   Patient: Greg Robertson           Date of Birth: Jun 05, 1950           MRN: [id number] Visit Date: 04/03/2023              Requested by: Dettinger, Elige Radon, MD [address] [place],  [place] [address] PCP: Dettinger, Elige Radon, MD  Chief Complaint  Patient presents with   Right Ankle - Wound Check      HPI: Patient is a 72 year old gentleman who is seen for evaluation for right ankle ulcer.  Patient has been in a Dynaflex wrap.  Assessment & Plan: Visit Diagnoses:  1. 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  Compression sock.  Follow-Up Instructions: Return in about 1 week (around 04/10/2023).   Ortho Exam  Patient is alert, oriented, no adenopathy, well-dressed, normal affect, normal respiratory effort. Examination the skin is wrinkling well there is some mild periwound dermatitis.  The wound bed is improving.  Calf is 34 cm in circumference.  Imaging: No results found.   Labs: Lab Results  Component Value Date   HGBA1C 9.2 (H) 12/08/2022   HGBA1C 9.9 (H) 10/27/2022   HGBA1C 12.3 (H) 07/21/2022   REPTSTATUS 03/11/2012 FINAL 03/10/2012   CULT NO GROWTH 03/10/2012     Lab Results  Component Value Date   ALBUMIN 3.1 (L) 12/08/2022   ALBUMIN 4.1 04/20/2022   ALBUMIN 3.9 03/24/2022    Lab Results  Component Value Date   MG 1.9 03/13/2012   MG 2.2 03/12/2012   MG 2.1 03/10/2012   Lab Results  Component Value Date   VD25OH 10.7 (L) 05/24/2018   VD25OH 14.1 (L) 01/17/2018   VD25OH 14.7 (L) 09/13/2017    No results found for: "PREALBUMIN"    Latest Ref Rng & Units 02/27/2023   12:35 PM 12/12/2022    4:59 AM 12/10/2022    5:39 AM  CBC EXTENDED  WBC 4.0 - 10.5 K/uL 7.4  6.9  9.5   RBC 4.22 - 5.81 MIL/uL 4.27  3.56  3.66   Hemoglobin 13.0 - 17.0 g/dL 52.8  41.3  24.4   HCT 39.0 - 52.0 % 37.1  30.7  33.1   Platelets 150 - 400 K/uL 159  156  126       There is no height or weight on file to calculate BMI.  Orders:  No orders of the defined types were placed in this encounter.  No orders of the defined types were placed in this encounter.    Procedures: No procedures performed  Clinical Data: No additional findings.  ROS:  All other systems negative, except as noted in the HPI. Review of Systems  Objective: Vital Signs: There were no vitals taken for this visit.  Specialty Comments:  No specialty comments available.  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   Mixed hyperlipidemia 08/22/2022   Chronic insomnia 11/24/2021   Retinopathy 11/24/2021   Central retinal vein occlusion, right eye, stable 10/25/2021   History of CVA (cerebrovascular accident) 09/27/2020   A-fib (HCC) 09/27/2020   Left homonymous hemianopsia 09/27/2020   COPD (chronic obstructive pulmonary disease) (  HCC) 09/05/2017   Aortic atherosclerosis (HCC) 09/04/2017   Benign prostatic hyperplasia 09/04/2017   Hypertension, essential 03/14/2012   Chronic CHF (congestive heart failure) (HCC) 03/12/2012   Lynch syndrome 07/28/2011   History of colon cancer 05/25/2011   DM type 1 (diabetes mellitus, type 1) (HCC) 05/10/2011   Glaucoma 05/10/2011   Past Medical History:  Diagnosis Date   Allergic rhinitis    Anemia    Atrial fibrillation (HCC)    Persistent   Cancer of ascending colon, 7cm 05/25/2011   Congestive heart failure (CHF) (HCC)    Diabetes mellitus type I (HCC)    Glaucoma    Hypertension    Pneumonia 1979 or 1980   Prostate nodule    Substance abuse (HCC)     Family History  Problem Relation Age of Onset   Breast cancer Mother    Pancreatitis Mother        intestinal adhesions   Insomnia Mother     Colon polyps Father    Lung cancer Father    Diabetes Father    Prostate cancer Father    Colon cancer Neg Hx    Rectal cancer Neg Hx    Esophageal cancer Neg Hx    Inflammatory bowel disease Neg Hx    Liver disease Neg Hx    Pancreatic cancer Neg Hx    Stomach cancer Neg Hx     Past Surgical History:  Procedure Laterality Date   broken left shoulder blade and collar bone     COLONOSCOPY WITH PROPOFOL N/A 04/29/2021   Procedure: COLONOSCOPY WITH PROPOFOL;  Surgeon: Rachael Fee, MD;  Location: [facility];  Service: Endoscopy;  Laterality: N/A;   ESOPHAGOGASTRODUODENOSCOPY (EGD) WITH PROPOFOL N/A 04/29/2021   Procedure: ESOPHAGOGASTRODUODENOSCOPY (EGD) WITH PROPOFOL;  Surgeon: Rachael Fee, MD;  Location: [facility];  Service: Endoscopy;  Laterality: N/A;   FINGER SURGERY  2009   right middle   HEMOSTASIS CLIP PLACEMENT  04/29/2021   Procedure: HEMOSTASIS CLIP PLACEMENT;  Surgeon: Rachael Fee, MD;  Location: [facility];  Service: Endoscopy;;   LAPAROSCOPIC ASSISTED 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     NASAL FRACTURE SURGERY  1968   ORIF ANKLE FRACTURE Right 12/08/2022   Procedure: OPEN REDUCTION INTERNAL FIXATION (ORIF) ANKLE FRACTURE;  Surgeon: London Sheer, MD;  Location: [facility];  Service: Orthopedics;  Laterality: Right;   POLYPECTOMY  04/29/2021   Procedure: POLYPECTOMY;  Surgeon: Rachael Fee, MD;  Location: [facility];  Service: Endoscopy;;   PORTACATH PLACEMENT  07/01/2011   Procedure: INSERTION PORT-A-CATH;  Surgeon: Ardeth Sportsman, MD;  Location: [facility];  Service: General;  Laterality: Left;  Insertion of Port-A-Catheter Left Internal Jugular   RIGHT/LEFT HEART CATH AND CORONARY ANGIOGRAPHY N/A 12/08/2020   Procedure: RIGHT/LEFT HEART CATH AND CORONARY ANGIOGRAPHY;  Surgeon: Laurey Morale, MD;  Location: [facility] INVASIVE CV LAB;  Service: Cardiovascular;  Laterality: N/A;   TONSILLECTOMY  1957 - approximate   Social History   Occupational  History   Occupation: Retired    Comment: Airline pilot  Tobacco Use   Smoking status: Former    Current packs/day: 0.00    Average packs/day: 1 pack/day for 15.0 years (15.0 ttl pk-yrs)    Types: Cigarettes    Start date: 04/18/1978    Quit date: 04/18/1993    Years since quitting: 29.9   Smokeless tobacco: Never   Tobacco comments:    marijuana every night   Vaping Use   Vaping status: Never  Used  Substance and Sexual Activity   Alcohol use: Not Currently    Alcohol/week: 5.0 standard drinks of alcohol    Types: 3 Cans of beer, 2 Shots of liquor per week    Comment: 1 drink every 2 days   Drug use: Not Currently    Types: Marijuana    Comment: once a night marijuana   Sexual activity: Not Currently

## 2023-04-10 ENCOUNTER — Ambulatory Visit: Payer: Medicare Other

## 2023-04-10 DIAGNOSIS — L97309 Non-pressure chronic ulcer of unspecified ankle with unspecified severity: Secondary | ICD-10-CM

## 2023-04-10 NOTE — Progress Notes (Signed)
Patient came in today for nurse only visit. I checked his RLE ulcer, washed it with vashe wound solution. Used silvercel on top of ulcer, then gauze and wrapped with ACE wrap from base of toes to below bend of knee. I have written orders to his SNF of orders above and to wash daily with dial soap and water. Also to start HHPT for strength and balance, WBAT. He will follow up with Dr. Lajoyce Corners next Monday.

## 2023-04-17 ENCOUNTER — Ambulatory Visit: Payer: Medicare Other | Admitting: Orthopedic Surgery

## 2023-04-17 DIAGNOSIS — I87331 Chronic venous hypertension (idiopathic) with ulcer and inflammation of right lower extremity: Secondary | ICD-10-CM

## 2023-04-17 DIAGNOSIS — L97309 Non-pressure chronic ulcer of unspecified ankle with unspecified severity: Secondary | ICD-10-CM | POA: Diagnosis not present

## 2023-04-17 DIAGNOSIS — E13622 Other specified diabetes mellitus with other skin ulcer: Secondary | ICD-10-CM | POA: Diagnosis not present

## 2023-04-18 ENCOUNTER — Encounter: Payer: Self-pay | Admitting: Orthopedic Surgery

## 2023-04-24 ENCOUNTER — Ambulatory Visit (INDEPENDENT_AMBULATORY_CARE_PROVIDER_SITE_OTHER): Payer: Medicare Other | Admitting: Orthopedic Surgery

## 2023-04-24 DIAGNOSIS — E13622 Other specified diabetes mellitus with other skin ulcer: Secondary | ICD-10-CM

## 2023-04-24 DIAGNOSIS — L97309 Non-pressure chronic ulcer of unspecified ankle with unspecified severity: Secondary | ICD-10-CM | POA: Diagnosis not present

## 2023-04-24 DIAGNOSIS — I87331 Chronic venous hypertension (idiopathic) with ulcer and inflammation of right lower extremity: Secondary | ICD-10-CM

## 2023-05-01 ENCOUNTER — Ambulatory Visit (INDEPENDENT_AMBULATORY_CARE_PROVIDER_SITE_OTHER): Payer: Medicare Other | Admitting: Orthopedic Surgery

## 2023-05-01 ENCOUNTER — Encounter: Payer: Self-pay | Admitting: Orthopedic Surgery

## 2023-05-01 DIAGNOSIS — L97309 Non-pressure chronic ulcer of unspecified ankle with unspecified severity: Secondary | ICD-10-CM

## 2023-05-01 DIAGNOSIS — E13622 Other specified diabetes mellitus with other skin ulcer: Secondary | ICD-10-CM

## 2023-05-01 NOTE — Progress Notes (Signed)
 Office Visit Note   Patient: Greg Robertson           Date of Birth: [birth year]           MRN: [id number] Visit Date: 05/01/2023              Requested by: Dettinger, Fonda LABOR, MD [address] [place],  [place] [address] PCP: Dettinger, Fonda LABOR, MD  Chief Complaint  Patient presents with   Right Ankle - Follow-up      HPI: Patient is a 73 year old gentleman who is seen in follow-up for right ankle ulcer.  Patient is currently on doxycycline  and undergoing compression dressing changes with silver alginate.  Assessment & Plan: Visit Diagnoses:  1. 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  Recommended elevation and exercise.  Follow-Up Instructions: Return in about 1 week (around 05/08/2023).   Ortho Exam  Patient is alert, oriented, no adenopathy, well-dressed, normal affect, normal respiratory effort. Examination patient has increased swelling of the right foot and ankle.  The wound measures 10 x 18 mm with flat healthy granulation tissue.  Imaging: No results found.   Labs: Lab Results  Component Value Date   HGBA1C 9.2 (H) 12/08/2022   HGBA1C 9.9 (H) 10/27/2022   HGBA1C 12.3 (H) 07/21/2022   REPTSTATUS 03/11/2012 FINAL 03/10/2012   CULT NO GROWTH 03/10/2012     Lab Results  Component Value Date   ALBUMIN  3.1 (L) 12/08/2022   ALBUMIN  4.1 04/20/2022   ALBUMIN  3.9 03/24/2022    Lab Results  Component Value Date   MG 1.9 03/13/2012   MG 2.2 03/12/2012   MG 2.1 03/10/2012   Lab Results  Component Value Date   VD25OH 10.7 (L) 05/24/2018   VD25OH 14.1 (L) 01/17/2018   VD25OH 14.7 (L) 09/13/2017    No results found for: PREALBUMIN    Latest Ref Rng & Units 02/27/2023   12:35 PM 12/12/2022    4:59 AM 12/10/2022    5:39 AM  CBC EXTENDED  WBC 4.0 - 10.5 K/uL 7.4  6.9  9.5   RBC 4.22 - 5.81 MIL/uL 4.27  3.56  3.66   Hemoglobin 13.0 - 17.0 g/dL 87.7   89.5  88.8   HCT 39.0 - 52.0 % 37.1  30.7  33.1   Platelets 150 - 400 K/uL 159  156  126      There is no height or weight on file to calculate BMI.  Orders:  No orders of the defined types were placed in this encounter.  No orders of the defined types were placed in this encounter.    Procedures: No procedures performed  Clinical Data: No additional findings.  ROS:  All other systems negative, except as noted in the HPI. Review of Systems  Objective: Vital Signs: There were no vitals taken for this visit.  Specialty Comments:  No specialty comments available.  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   Mixed hyperlipidemia 08/22/2022   Chronic insomnia 11/24/2021   Retinopathy 11/24/2021   Central retinal vein occlusion, right eye, stable 10/25/2021   History of CVA (cerebrovascular accident) 09/27/2020   A-fib (HCC) 09/27/2020   Left homonymous  hemianopsia 09/27/2020   COPD (chronic obstructive pulmonary disease) (HCC) 09/05/2017   Aortic atherosclerosis (HCC) 09/04/2017   Benign prostatic hyperplasia 09/04/2017   Hypertension, essential 03/14/2012   Chronic CHF (congestive heart failure) (HCC) 03/12/2012   Lynch syndrome 07/28/2011   History of colon cancer 05/25/2011   DM type 1 (diabetes mellitus, type 1) (HCC) 05/10/2011   Glaucoma 05/10/2011   Past Medical History:  Diagnosis Date   Allergic rhinitis    Anemia    Atrial fibrillation (HCC)    Persistent   Cancer of ascending colon, 7cm 05/25/2011   Congestive heart failure (CHF) (HCC)    Diabetes mellitus type I (HCC)    Glaucoma    Hypertension    Pneumonia 1979 or 1980   Prostate nodule    Substance abuse (HCC)     Family History  Problem Relation Age of Onset    Breast cancer Mother    Pancreatitis Mother        intestinal adhesions   Insomnia Mother    Colon polyps Father    Lung cancer Father    Diabetes Father    Prostate cancer Father    Colon cancer Neg Hx    Rectal cancer Neg Hx    Esophageal cancer Neg Hx    Inflammatory bowel disease Neg Hx    Liver disease Neg Hx    Pancreatic cancer Neg Hx    Stomach cancer Neg Hx     Past Surgical History:  Procedure Laterality Date   broken left shoulder blade and collar bone     COLONOSCOPY WITH PROPOFOL  N/A 04/29/2021   Procedure: COLONOSCOPY WITH PROPOFOL ;  Surgeon: Teressa Toribio SQUIBB, MD;  Location: [facility];  Service: Endoscopy;  Laterality: N/A;   ESOPHAGOGASTRODUODENOSCOPY (EGD) WITH PROPOFOL  N/A 04/29/2021   Procedure: ESOPHAGOGASTRODUODENOSCOPY (EGD) WITH PROPOFOL ;  Surgeon: Teressa Toribio SQUIBB, MD;  Location: [facility];  Service: Endoscopy;  Laterality: N/A;   FINGER SURGERY  2009   right middle   HEMOSTASIS CLIP PLACEMENT  04/29/2021   Procedure: HEMOSTASIS CLIP PLACEMENT;  Surgeon: Teressa Toribio SQUIBB, MD;  Location: [facility];  Service: Endoscopy;;   LAPAROSCOPIC ASSISTED 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     NASAL FRACTURE SURGERY  1968   ORIF ANKLE FRACTURE Right 12/08/2022   Procedure: OPEN REDUCTION INTERNAL FIXATION (ORIF) ANKLE FRACTURE;  Surgeon: Georgina Ozell LABOR, MD;  Location: [facility];  Service: Orthopedics;  Laterality: Right;   POLYPECTOMY  04/29/2021   Procedure: POLYPECTOMY;  Surgeon: Teressa Toribio SQUIBB, MD;  Location: [facility];  Service: Endoscopy;;   PORTACATH PLACEMENT  07/01/2011   Procedure: INSERTION PORT-A-CATH;  Surgeon: Elspeth KYM Schultze, MD;  Location: [facility];  Service: General;  Laterality: Left;  Insertion of Port-A-Catheter Left Internal Jugular   RIGHT/LEFT HEART CATH AND CORONARY ANGIOGRAPHY N/A 12/08/2020   Procedure: RIGHT/LEFT HEART CATH AND CORONARY ANGIOGRAPHY;  Surgeon: Rolan Ezra RAMAN, MD;  [facility] INVASIVE CV LAB;  Service:  Cardiovascular;  Laterality: N/A;   TONSILLECTOMY  1957 - approximate   Social History   Occupational History   Occupation: Retired    Comment: airline pilot  Tobacco Use   Smoking status: Former    Current packs/day: 0.00    Average packs/day: 1 pack/day for 15.0 years (15.0 ttl pk-yrs)    Types: Cigarettes    Start date: 04/18/1978    Quit date: 04/18/1993    Years since quitting: 30.0   Smokeless tobacco: Never   Tobacco comments:    marijuana every night  Vaping Use   Vaping status: Never Used  Substance and Sexual Activity   Alcohol use: Not Currently    Alcohol/week: 5.0 standard drinks of alcohol    Types: 3 Cans of beer, 2 Shots of liquor per week    Comment: 1 drink every 2 days   Drug use: Not Currently    Types: Marijuana    Comment: once a night marijuana   Sexual activity: Not Currently

## 2023-05-02 ENCOUNTER — Encounter: Payer: Self-pay | Admitting: Orthopedic Surgery

## 2023-05-02 NOTE — Progress Notes (Signed)
 Office Visit Note   Patient: Greg Robertson           Date of Birth: 11-12-1950           MRN: [id number] Visit Date: 04/24/2023              Requested by: Dettinger, Fonda LABOR, MD [address] [place],  [place] [address] PCP: Dettinger, Fonda LABOR, MD  Chief Complaint  Patient presents with   Right Ankle - Wound Check      HPI: Patient is a 73 year old gentleman is seen in follow-up for right ankle ulcer.  Patient is in the study reviewed.  He is currently undergoing home health nursing and therapy.  He is weightbearing in a cam boot.  Assessment & Plan: Visit Diagnoses: No diagnosis found.  Plan: Wound cleansed today with Vashe continue with silver alginate dressing changes.  Follow-Up Instructions: Return in about 1 week (around 05/01/2023).   Ortho Exam  Patient is alert, oriented, no adenopathy, well-dressed, normal affect, normal respiratory effort. Examination patient has brawny skin color changes with lymphedema.  There is decreased swelling there is some wrinkling of the skin at this time.  The ankle ulcer is flat with improved granulation tissue it measures 6 x 16 mm.  Imaging: No results found.   Labs: Lab Results  Component Value Date   HGBA1C 9.2 (H) 12/08/2022   HGBA1C 9.9 (H) 10/27/2022   HGBA1C 12.3 (H) 07/21/2022   REPTSTATUS 03/11/2012 FINAL 03/10/2012   CULT NO GROWTH 03/10/2012     Lab Results  Component Value Date   ALBUMIN  3.1 (L) 12/08/2022   ALBUMIN  4.1 04/20/2022   ALBUMIN  3.9 03/24/2022    Lab Results  Component Value Date   MG 1.9 03/13/2012   MG 2.2 03/12/2012   MG 2.1 03/10/2012   Lab Results  Component Value Date   VD25OH 10.7 (L) 05/24/2018   VD25OH 14.1 (L) 01/17/2018   VD25OH 14.7 (L) 09/13/2017    No results found for: PREALBUMIN    Latest Ref Rng & Units 02/27/2023   12:35 PM 12/12/2022    4:59 AM 12/10/2022    5:39 AM  CBC EXTENDED  WBC 4.0 - 10.5 K/uL 7.4  6.9  9.5   RBC 4.22 - 5.81 MIL/uL 4.27  3.56  3.66    Hemoglobin 13.0 - 17.0 g/dL 87.7  89.5  88.8   HCT 39.0 - 52.0 % 37.1  30.7  33.1   Platelets 150 - 400 K/uL 159  156  126      There is no height or weight on file to calculate BMI.  Orders:  No orders of the defined types were placed in this encounter.  No orders of the defined types were placed in this encounter.    Procedures: No procedures performed  Clinical Data: No additional findings.  ROS:  All other systems negative, except as noted in the HPI. Review of Systems  Objective: Vital Signs: There were no vitals taken for this visit.  Specialty Comments:  No specialty comments available.  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   Mixed hyperlipidemia 08/22/2022   Chronic insomnia 11/24/2021   Retinopathy 11/24/2021   Central retinal vein occlusion, right eye, stable 10/25/2021   History of CVA (cerebrovascular  accident) 09/27/2020   A-fib (HCC) 09/27/2020   Left homonymous hemianopsia 09/27/2020   COPD (chronic obstructive pulmonary disease) (HCC) 09/05/2017   Aortic atherosclerosis (HCC) 09/04/2017   Benign prostatic hyperplasia 09/04/2017   Hypertension, essential 03/14/2012   Chronic CHF (congestive heart failure) (HCC) 03/12/2012   Lynch syndrome 07/28/2011   History of colon cancer 05/25/2011   DM type 1 (diabetes mellitus, type 1) (HCC) 05/10/2011   Glaucoma 05/10/2011   Past Medical History:  Diagnosis Date   Allergic rhinitis    Anemia    Atrial fibrillation (HCC)    Persistent   Cancer of ascending colon, 7cm 05/25/2011   Congestive heart failure (CHF) (HCC)    Diabetes mellitus type I (HCC)    Glaucoma    Hypertension    Pneumonia 1979 or 1980   Prostate nodule    Substance abuse (HCC)     Family History   Problem Relation Age of Onset   Breast cancer Mother    Pancreatitis Mother        intestinal adhesions   Insomnia Mother    Colon polyps Father    Lung cancer Father    Diabetes Father    Prostate cancer Father    Colon cancer Neg Hx    Rectal cancer Neg Hx    Esophageal cancer Neg Hx    Inflammatory bowel disease Neg Hx    Liver disease Neg Hx    Pancreatic cancer Neg Hx    Stomach cancer Neg Hx     Past Surgical History:  Procedure Laterality Date   broken left shoulder blade and collar bone     COLONOSCOPY WITH PROPOFOL  N/A 04/29/2021   Procedure: COLONOSCOPY WITH PROPOFOL ;  Surgeon: Teressa Toribio SQUIBB, MD;  Location: [facility];  Service: Endoscopy;  Laterality: N/A;   ESOPHAGOGASTRODUODENOSCOPY (EGD) WITH PROPOFOL  N/A 04/29/2021   Procedure: ESOPHAGOGASTRODUODENOSCOPY (EGD) WITH PROPOFOL ;  Surgeon: Teressa Toribio SQUIBB, MD;  Location: [facility];  Service: Endoscopy;  Laterality: N/A;   FINGER SURGERY  2009   right middle   HEMOSTASIS CLIP PLACEMENT  04/29/2021   Procedure: HEMOSTASIS CLIP PLACEMENT;  Surgeon: Teressa Toribio SQUIBB, MD;  Location: [facility];  Service: Endoscopy;;   LAPAROSCOPIC ASSISTED 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     NASAL FRACTURE SURGERY  1968   ORIF ANKLE FRACTURE Right 12/08/2022   Procedure: OPEN REDUCTION INTERNAL FIXATION (ORIF) ANKLE FRACTURE;  Surgeon: Georgina Ozell LABOR, MD;  Location: [facility];  Service: Orthopedics;  Laterality: Right;   POLYPECTOMY  04/29/2021   Procedure: POLYPECTOMY;  Surgeon: Teressa Toribio SQUIBB, MD;  Location: [facility];  Service: Endoscopy;;   PORTACATH PLACEMENT  07/01/2011   Procedure: INSERTION PORT-A-CATH;  Surgeon: Elspeth KYM Schultze, MD;  Location: [facility];  Service: General;  Laterality: Left;  Insertion of Port-A-Catheter Left Internal Jugular   RIGHT/LEFT HEART CATH AND CORONARY ANGIOGRAPHY N/A 12/08/2020   Procedure: RIGHT/LEFT HEART CATH AND CORONARY ANGIOGRAPHY;  Surgeon: Rolan Ezra RAMAN, MD;  Location: [facility]  INVASIVE CV LAB;  Service: Cardiovascular;  Laterality: N/A;   TONSILLECTOMY  1957 - approximate   Social History   Occupational History   Occupation: Retired    Comment: airline pilot  Tobacco Use   Smoking status: Former    Current packs/day: 0.00    Average packs/day: 1 pack/day for 15.0 years (15.0 ttl pk-yrs)    Types: Cigarettes    Start date: 04/18/1978    Quit date: 04/18/1993    Years since quitting: 30.0   Smokeless tobacco:  Never   Tobacco comments:    marijuana every night   Vaping Use   Vaping status: Never Used  Substance and Sexual Activity   Alcohol use: Not Currently    Alcohol/week: 5.0 standard drinks of alcohol    Types: 3 Cans of beer, 2 Shots of liquor per week    Comment: 1 drink every 2 days   Drug use: Not Currently    Types: Marijuana    Comment: once a night marijuana   Sexual activity: Not Currently

## 2023-05-04 ENCOUNTER — Encounter (HOSPITAL_COMMUNITY): Payer: Medicare Other

## 2023-05-09 ENCOUNTER — Ambulatory Visit (INDEPENDENT_AMBULATORY_CARE_PROVIDER_SITE_OTHER): Payer: Medicare Other | Admitting: Orthopedic Surgery

## 2023-05-09 DIAGNOSIS — L97309 Non-pressure chronic ulcer of unspecified ankle with unspecified severity: Secondary | ICD-10-CM

## 2023-05-09 DIAGNOSIS — E13622 Other specified diabetes mellitus with other skin ulcer: Secondary | ICD-10-CM

## 2023-05-10 ENCOUNTER — Encounter: Payer: Self-pay | Admitting: Orthopedic Surgery

## 2023-05-10 NOTE — Progress Notes (Signed)
Office Visit Note   Patient: Greg Robertson           Date of Birth: 1951-04-16           MRN: [id number] Visit Date: 05/09/2023              Requested by: Dettinger, Elige Radon, MD [address] [place],  [place] [address] PCP: Dettinger, Elige Radon, MD  Chief Complaint  Patient presents with   Right Ankle - Follow-up      HPI: Patient is a 73 year old gentleman who presents in follow-up for right ankle ulcer status post [facility] tissue graft.  He is currently on doxycycline and using silver cell dressing changes.  He is in a fracture boot.  Assessment & Plan: Visit Diagnoses:  1. 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  Follow-Up Instructions: Return in about 2 weeks (around 05/23/2023).   Ortho Exam  Patient is alert, oriented, no adenopathy, well-dressed, normal affect, normal respiratory effort. Examination wound has scab around the edges the wound was debrided after debridement there is healthy granulation tissue.  The wound measures 10 x 15 mm and is flat.  Imaging: No results found.      Labs: Lab Results  Component Value Date   HGBA1C 9.2 (H) 12/08/2022   HGBA1C 9.9 (H) 10/27/2022   HGBA1C 12.3 (H) 07/21/2022   REPTSTATUS 03/11/2012 FINAL 03/10/2012   CULT NO GROWTH 03/10/2012     Lab Results  Component Value Date   ALBUMIN 3.1 (L) 12/08/2022   ALBUMIN 4.1 04/20/2022   ALBUMIN 3.9 03/24/2022    Lab Results  Component Value Date   MG 1.9 03/13/2012   MG 2.2 03/12/2012   MG 2.1 03/10/2012   Lab Results  Component Value Date   VD25OH 10.7 (L) 05/24/2018   VD25OH 14.1 (L) 01/17/2018   VD25OH 14.7 (L) 09/13/2017    No results found for: "PREALBUMIN"    Latest Ref Rng & Units 02/27/2023   12:35 PM 12/12/2022    4:59 AM 12/10/2022    5:39 AM  CBC EXTENDED  WBC 4.0 - 10.5 K/uL 7.4  6.9  9.5   RBC 4.22 - 5.81 MIL/uL 4.27  3.56  3.66    Hemoglobin 13.0 - 17.0 g/dL 19.1  47.8  29.5   HCT 39.0 - 52.0 % 37.1  30.7  33.1   Platelets 150 - 400 K/uL 159  156  126      There is no height or weight on file to calculate BMI.  Orders:  No orders of the defined types were placed in this encounter.  No orders of the defined types were placed in this encounter.    Procedures: No procedures performed  Clinical Data: No additional findings.  ROS:  All other systems negative, except as noted in the HPI. Review of Systems  Objective: Vital Signs: There were no vitals taken for this visit.  Specialty Comments:  No specialty comments available.  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   Mixed hyperlipidemia 08/22/2022   Chronic insomnia 11/24/2021   Retinopathy 11/24/2021   Central retinal vein occlusion, right eye, stable 10/25/2021   History  of CVA (cerebrovascular accident) 09/27/2020   A-fib (HCC) 09/27/2020   Left homonymous hemianopsia 09/27/2020   COPD (chronic obstructive pulmonary disease) (HCC) 09/05/2017   Aortic atherosclerosis (HCC) 09/04/2017   Benign prostatic hyperplasia 09/04/2017   Hypertension, essential 03/14/2012   Chronic CHF (congestive heart failure) (HCC) 03/12/2012   Lynch syndrome 07/28/2011   History of colon cancer 05/25/2011   DM type 1 (diabetes mellitus, type 1) (HCC) 05/10/2011   Glaucoma 05/10/2011   Past Medical History:  Diagnosis Date   Allergic rhinitis    Anemia    Atrial fibrillation (HCC)    Persistent   Cancer of ascending colon, 7cm 05/25/2011   Congestive heart failure (CHF) (HCC)    Diabetes mellitus type I (HCC)    Glaucoma    Hypertension    Pneumonia 1979 or 1980   Prostate nodule    Substance abuse (HCC)     Family History   Problem Relation Age of Onset   Breast cancer Mother    Pancreatitis Mother        intestinal adhesions   Insomnia Mother    Colon polyps Father    Lung cancer Father    Diabetes Father    Prostate cancer Father    Colon cancer Neg Hx    Rectal cancer Neg Hx    Esophageal cancer Neg Hx    Inflammatory bowel disease Neg Hx    Liver disease Neg Hx    Pancreatic cancer Neg Hx    Stomach cancer Neg Hx     Past Surgical History:  Procedure Laterality Date   broken left shoulder blade and collar bone     COLONOSCOPY WITH PROPOFOL N/A 04/29/2021   Procedure: COLONOSCOPY WITH PROPOFOL;  Surgeon: Rachael Fee, MD;  Location: [facility];  Service: Endoscopy;  Laterality: N/A;   ESOPHAGOGASTRODUODENOSCOPY (EGD) WITH PROPOFOL N/A 04/29/2021   Procedure: ESOPHAGOGASTRODUODENOSCOPY (EGD) WITH PROPOFOL;  Surgeon: Rachael Fee, MD;  [facility];  Service: Endoscopy;  Laterality: N/A;   FINGER SURGERY  2009   right middle   HEMOSTASIS CLIP PLACEMENT  04/29/2021   Procedure: HEMOSTASIS CLIP PLACEMENT;  Surgeon: Rachael Fee, MD;  [facility];  Service: Endoscopy;;   LAPAROSCOPIC ASSISTED 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     NASAL FRACTURE SURGERY  1968   ORIF ANKLE FRACTURE Right 12/08/2022   Procedure: OPEN REDUCTION INTERNAL FIXATION (ORIF) ANKLE FRACTURE;  Surgeon: London Sheer, MD;  [facility]: [facility];  Service: Orthopedics;  Laterality: Right;   POLYPECTOMY  04/29/2021   Procedure: POLYPECTOMY;  Surgeon: Rachael Fee, MD;  [facility];  Service: Endoscopy;;   PORTACATH PLACEMENT  07/01/2011   Procedure: INSERTION PORT-A-CATH;  Surgeon: Ardeth Sportsman, MD;  [facility]: [facility];  Service: General;  Laterality: Left;  Insertion of Port-A-Catheter Left Internal Jugular   RIGHT/LEFT HEART CATH AND CORONARY ANGIOGRAPHY N/A 12/08/2020   Procedure: RIGHT/LEFT HEART CATH AND CORONARY ANGIOGRAPHY;  Surgeon: Laurey Morale, MD;  Location: [facility]  INVASIVE CV LAB;  Service: Cardiovascular;  Laterality: N/A;   TONSILLECTOMY  1957 - approximate   Social History   Occupational History   Occupation: Retired    Comment: Airline pilot  Tobacco Use   Smoking status: Former    Current packs/day: 0.00    Average packs/day: 1 pack/day for 15.0 years (15.0 ttl pk-yrs)    Types: Cigarettes    Start date: 04/18/1978    Quit date: 04/18/1993    Years since quitting: 30.0  Smokeless tobacco: Never   Tobacco comments:    marijuana every night   Vaping Use   Vaping status: Never Used  Substance and Sexual Activity   Alcohol use: Not Currently    Alcohol/week: 5.0 standard drinks of alcohol    Types: 3 Cans of beer, 2 Shots of liquor per week    Comment: 1 drink every 2 days   Drug use: Not Currently    Types: Marijuana    Comment: once a night marijuana   Sexual activity: Not Currently

## 2023-05-15 ENCOUNTER — Ambulatory Visit: Payer: Medicare Other | Admitting: Family Medicine

## 2023-05-23 ENCOUNTER — Encounter: Payer: Self-pay | Admitting: Orthopedic Surgery

## 2023-05-23 ENCOUNTER — Ambulatory Visit (HOSPITAL_COMMUNITY)
Admission: RE | Admit: 2023-05-23 | Discharge: 2023-05-23 | Disposition: A | Discharge status: Home / Self Care | Payer: Medicare Other | Source: Ambulatory Visit | Attending: Family Medicine | Admitting: Family Medicine

## 2023-05-23 ENCOUNTER — Ambulatory Visit (INDEPENDENT_AMBULATORY_CARE_PROVIDER_SITE_OTHER): Payer: Medicare Other | Admitting: Orthopedic Surgery

## 2023-05-23 ENCOUNTER — Encounter (HOSPITAL_COMMUNITY): Payer: Self-pay

## 2023-05-23 VITALS — BP 102/58 | HR 85 | Ht 74.0 in | Wt 200.2 lb

## 2023-05-23 DIAGNOSIS — E1059 Type 1 diabetes mellitus with other circulatory complications: Secondary | ICD-10-CM

## 2023-05-23 DIAGNOSIS — L97309 Non-pressure chronic ulcer of unspecified ankle with unspecified severity: Secondary | ICD-10-CM

## 2023-05-23 DIAGNOSIS — E1021 Type 1 diabetes mellitus with diabetic nephropathy: Secondary | ICD-10-CM | POA: Diagnosis not present

## 2023-05-23 DIAGNOSIS — E13622 Other specified diabetes mellitus with other skin ulcer: Secondary | ICD-10-CM

## 2023-05-23 DIAGNOSIS — I480 Paroxysmal atrial fibrillation: Secondary | ICD-10-CM | POA: Diagnosis present

## 2023-05-23 DIAGNOSIS — I130 Hypertensive heart and chronic kidney disease with heart failure and stage 1 through stage 4 chronic kidney disease, or unspecified chronic kidney disease: Secondary | ICD-10-CM | POA: Diagnosis not present

## 2023-05-23 DIAGNOSIS — N183 Chronic kidney disease, stage 3 unspecified: Secondary | ICD-10-CM | POA: Diagnosis not present

## 2023-05-23 DIAGNOSIS — E875 Hyperkalemia: Secondary | ICD-10-CM | POA: Diagnosis not present

## 2023-05-23 DIAGNOSIS — Z85038 Personal history of other malignant neoplasm of large intestine: Secondary | ICD-10-CM | POA: Insufficient documentation

## 2023-05-23 DIAGNOSIS — I493 Ventricular premature depolarization: Secondary | ICD-10-CM | POA: Insufficient documentation

## 2023-05-23 DIAGNOSIS — Z79899 Other long term (current) drug therapy: Secondary | ICD-10-CM | POA: Insufficient documentation

## 2023-05-23 DIAGNOSIS — E1022 Type 1 diabetes mellitus with diabetic chronic kidney disease: Secondary | ICD-10-CM | POA: Insufficient documentation

## 2023-05-23 DIAGNOSIS — Z7901 Long term (current) use of anticoagulants: Secondary | ICD-10-CM | POA: Insufficient documentation

## 2023-05-23 DIAGNOSIS — I2510 Atherosclerotic heart disease of native coronary artery without angina pectoris: Secondary | ICD-10-CM | POA: Diagnosis not present

## 2023-05-23 DIAGNOSIS — Z794 Long term (current) use of insulin: Secondary | ICD-10-CM | POA: Diagnosis not present

## 2023-05-23 DIAGNOSIS — E785 Hyperlipidemia, unspecified: Secondary | ICD-10-CM

## 2023-05-23 DIAGNOSIS — I428 Other cardiomyopathies: Secondary | ICD-10-CM | POA: Insufficient documentation

## 2023-05-23 DIAGNOSIS — I5022 Chronic systolic (congestive) heart failure: Secondary | ICD-10-CM | POA: Diagnosis not present

## 2023-05-23 LAB — BASIC METABOLIC PANEL
Anion gap: 8 (ref 5–15)
BUN: 34 mg/dL — ABNORMAL HIGH (ref 8–23)
CO2: 25 mmol/L (ref 22–32)
Calcium: 8.9 mg/dL (ref 8.9–10.3)
Chloride: 102 mmol/L (ref 98–111)
Creatinine, Ser: 1.44 mg/dL — ABNORMAL HIGH (ref 0.61–1.24)
GFR, Estimated: 52 mL/min — ABNORMAL LOW (ref >60)
Glucose, Bld: 426 mg/dL — ABNORMAL HIGH (ref 70–99)
Potassium: 4.8 mmol/L (ref 3.5–5.1)
Sodium: 135 mmol/L (ref 135–145)

## 2023-05-23 LAB — CBC
HCT: 39 % (ref 39.0–52.0)
Hemoglobin: 12.9 g/dL — ABNORMAL LOW (ref 13.0–17.0)
MCH: 28.5 pg (ref 26.0–34.0)
MCHC: 33.1 g/dL (ref 30.0–36.0)
MCV: 86.1 fL (ref 80.0–100.0)
Platelets: 173 10*3/uL (ref 150–400)
RBC: 4.53 MIL/uL (ref 4.22–5.81)
RDW: 14.2 % (ref 11.5–15.5)
WBC: 7.7 10*3/uL (ref 4.0–10.5)
nRBC: 0 % (ref 0.0–0.2)

## 2023-05-23 NOTE — Patient Instructions (Signed)
 Medication Changes:  No Changes In Medications at this time.   Lab Work:  Labs done today, your results will be available in MyChart, we will contact you for abnormal readings.  Referrals:  YOU HAVE BEEN REFERRED TO NEPHROLOGY AT [facility] THEY WILL REACH OUT TO YOU OR CALL TO ARRANGE THIS. PLEASE CALL US  WITH ANY CONCERNS   PLEASE CHECK BLOOD PRESSURE ONCE WEEKLY- AND PLEASE CALL THE OFFICE FOR BLOOD PRESSURE LESS THAN 100 SYSTOLIC   Follow-Up in: 3 MONTHS WITH DR. ROLAN PLEASE CALL OUR OFFICE AROUND MARCH  TO GET SCHEDULED FOR YOUR APPOINTMENT. PHONE NUMBER IS [phone] OPTION 2   At the [facility], you and your health needs are our priority. We have a designated team specialized in the treatment of Heart Failure. This Care Team includes your primary Heart Failure Specialized Cardiologist (physician), Advanced Practice Providers (APPs- Physician Assistants and Nurse Practitioners), and Pharmacist who all work together to provide you with the care you need, when you need it.   You may see any of the following providers on your designated Care Team at your next follow up:  Dr. Toribio Fuel Dr. Ezra Rolan Dr. Ria Commander Dr. Odis Brownie Greig Mosses, NP Caffie Shed, [place] [facility] [place], [place] Beckey Coe, NP Jordan Lee, NP Tinnie Redman, PharmD   Please be sure to bring in all your medications bottles to every appointment.   Need to Contact Us :  If you have any questions or concerns before your next appointment please send us  a message through [place] or call our office at [phone].    TO LEAVE A MESSAGE FOR THE NURSE SELECT OPTION 2, PLEASE LEAVE A MESSAGE INCLUDING: YOUR NAME DATE OF BIRTH CALL BACK NUMBER REASON FOR CALL**this is important as we prioritize the call backs  YOU WILL RECEIVE A CALL BACK THE SAME DAY AS LONG AS YOU CALL BEFORE 4:00 PM

## 2023-05-23 NOTE — Progress Notes (Signed)
 Office Visit Note   Patient: Greg Robertson           Date of Birth: July 22, 1950           MRN: [id number] Visit Date: 05/23/2023              Requested by: Dettinger, Fonda LABOR, MD [address] [place],  [place] [address] PCP: Dettinger, Fonda LABOR, MD  Chief Complaint  Patient presents with   Right Ankle - Follow-up      HPI: Patient is a 73 year old gentleman is seen in follow-up for open ankle wound right ankle status post ankle fracture.  Patient has had treatment with [facility] tissue graft for wound healing.  Assessment & Plan: Visit Diagnoses:  1. 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  Continue wound care with silver cell and compression.  At follow-up in 2 weeks recommended proceeding with a knee-high compression sock.  Follow-Up Instructions: Return in about 2 weeks (around 06/06/2023).   Ortho Exam  Patient is alert, oriented, no adenopathy, well-dressed, normal affect, normal respiratory effort. Examination patient does have brawny skin color changes.  There is no cellulitis no drainage from the wound.  There is been excellent improvement in the wound that measures 5 x 10 mm.  There is hypergranulation tissue that was touched with silver nitrate.  Calf measures 35 cm in circumference.  Imaging: No results found.   Labs: Lab Results  Component Value Date   HGBA1C 9.2 (H) 12/08/2022   HGBA1C 9.9 (H) 10/27/2022   HGBA1C 12.3 (H) 07/21/2022   REPTSTATUS 03/11/2012 FINAL 03/10/2012   CULT NO GROWTH 03/10/2012     Lab Results  Component Value Date   ALBUMIN  3.1 (L) 12/08/2022   ALBUMIN  4.1 04/20/2022   ALBUMIN  3.9 03/24/2022    Lab Results  Component Value Date   MG 1.9 03/13/2012   MG 2.2 03/12/2012   MG 2.1 03/10/2012   Lab Results  Component Value Date   VD25OH 10.7 (L) 05/24/2018   VD25OH 14.1 (L) 01/17/2018   VD25OH 14.7 (L) 09/13/2017    No results found for: PREALBUMIN    Latest  Ref Rng & Units 05/23/2023   10:17 AM 02/27/2023   12:35 PM 12/12/2022    4:59 AM  CBC EXTENDED  WBC 4.0 - 10.5 K/uL 7.7  7.4  6.9   RBC 4.22 - 5.81 MIL/uL 4.53  4.27  3.56   Hemoglobin 13.0 - 17.0 g/dL 87.0  87.7  89.5   HCT 39.0 - 52.0 % 39.0  37.1  30.7   Platelets 150 - 400 K/uL 173  159  156      There is no height or weight on file to calculate BMI.  Orders:  No orders of the defined types were placed in this encounter.  No orders of the defined types were placed in this encounter.    Procedures: No procedures performed  Clinical Data: No additional findings.  ROS:  All other systems negative, except as noted in the HPI. Review of Systems  Objective: Vital Signs: There were no vitals taken for this visit.  Specialty Comments:  No specialty comments available.  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  Mixed hyperlipidemia 08/22/2022   Chronic insomnia 11/24/2021   Retinopathy 11/24/2021   Central retinal vein occlusion, right eye, stable 10/25/2021   History of CVA (cerebrovascular accident) 09/27/2020   A-fib (HCC) 09/27/2020   Left homonymous hemianopsia 09/27/2020   COPD (chronic obstructive pulmonary disease) (HCC) 09/05/2017   Aortic atherosclerosis (HCC) 09/04/2017   Benign prostatic hyperplasia 09/04/2017   Hypertension, essential 03/14/2012   Chronic CHF (congestive heart failure) (HCC) 03/12/2012   Lynch syndrome 07/28/2011   History of colon cancer 05/25/2011   DM type 1 (diabetes mellitus, type 1) (HCC) 05/10/2011   Glaucoma 05/10/2011   Past Medical History:  Diagnosis Date   Allergic rhinitis    Anemia    Atrial fibrillation (HCC)    Persistent   Cancer of ascending colon, 7cm 05/25/2011   Congestive heart  failure (CHF) (HCC)    Diabetes mellitus type I (HCC)    Glaucoma    Hypertension    Pneumonia 1979 or 1980   Prostate nodule    Substance abuse (HCC)     Family History  Problem Relation Age of Onset   Breast cancer Mother    Pancreatitis Mother        intestinal adhesions   Insomnia Mother    Colon polyps Father    Lung cancer Father    Diabetes Father    Prostate cancer Father    Colon cancer Neg Hx    Rectal cancer Neg Hx    Esophageal cancer Neg Hx    Inflammatory bowel disease Neg Hx    Liver disease Neg Hx    Pancreatic cancer Neg Hx    Stomach cancer Neg Hx     Past Surgical History:  Procedure Laterality Date   broken left shoulder blade and collar bone     COLONOSCOPY WITH PROPOFOL  N/A 04/29/2021   Procedure: COLONOSCOPY WITH PROPOFOL ;  Surgeon: Teressa Toribio SQUIBB, MD;  [facility];  Service: Endoscopy;  Laterality: N/A;   ESOPHAGOGASTRODUODENOSCOPY (EGD) WITH PROPOFOL  N/A 04/29/2021   Procedure: ESOPHAGOGASTRODUODENOSCOPY (EGD) WITH PROPOFOL ;  Surgeon: Teressa Toribio SQUIBB, MD;  [facility];  Service: Endoscopy;  Laterality: N/A;   FINGER SURGERY  2009   right middle   HEMOSTASIS CLIP PLACEMENT  04/29/2021   Procedure: HEMOSTASIS CLIP PLACEMENT;  Surgeon: Teressa Toribio SQUIBB, MD;  [facility];  Service: Endoscopy;;   LAPAROSCOPIC ASSISTED 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     NASAL FRACTURE SURGERY  1968   ORIF ANKLE FRACTURE Right 12/08/2022   Procedure: OPEN REDUCTION INTERNAL FIXATION (ORIF) ANKLE FRACTURE;  Surgeon: Georgina Ozell LABOR, MD;  Location[facility];  Service: Orthopedics;  Laterality: Right;   POLYPECTOMY  04/29/2021   Procedure: POLYPECTOMY;  Surgeon: Teressa Toribio SQUIBB, MD;  [facility];  Service: Endoscopy;;   PORTACATH PLACEMENT  07/01/2011   Procedure: INSERTION PORT-A-CATH;  Surgeon: Elspeth KYM Schultze, MD;  [facility];  Service: General;  Laterality: Left;  Insertion of Port-A-Catheter Left Internal  Jugular   RIGHT/LEFT HEART CATH AND CORONARY ANGIOGRAPHY N/A 12/08/2020   Procedure: RIGHT/LEFT HEART CATH AND CORONARY ANGIOGRAPHY;  Surgeon: Rolan Ezra RAMAN, MD;  Location: [facility] INVASIVE CV LAB;  Service: Cardiovascular;  Laterality: N/A;   TONSILLECTOMY  1957 - approximate   Social History   Occupational History   Occupation: Retired    Comment: airline pilot  Tobacco Use   Smoking status: Former    Current packs/day: 0.00    Average packs/day: 1 pack/day for 15.0 years (15.0 ttl pk-yrs)  Types: Cigarettes    Start date: 04/18/1978    Quit date: 04/18/1993    Years since quitting: 30.1   Smokeless tobacco: Never   Tobacco comments:    marijuana every night   Vaping Use   Vaping status: Never Used  Substance and Sexual Activity   Alcohol use: Not Currently    Alcohol/week: 5.0 standard drinks of alcohol    Types: 3 Cans of beer, 2 Shots of liquor per week    Comment: 1 drink every 2 days   Drug use: Not Currently    Types: Marijuana    Comment: once a night marijuana   Sexual activity: Not Currently

## 2023-05-23 NOTE — Progress Notes (Signed)
 [facility] Progress Note    Patient ID: NUR KRASINSKI, male   DOB: 1951/02/23, 73 y.o.   MRN: [id number] PCP: Dettinger, Fonda LABOR, MD Cardiology: Dr. Rolan  73 y.o. with history of HTN, type I diabetes, and paroxysmal atrial fibrillation/CVA was referred by Dr. Francesco for evaluation of cardiomyopathy.  Patient also has had colon cancer with ileocolectomy in 2/13.  In 11/13, he fell and broke his clavicle (tripped).  Soon after this, he was admitted with DKA and profound dehydration/metabolic derangement.  He was noted to be in atrial fibrillation with RVR.  His cardiac enzymes were elevated, troponin peaked at 1.57. He initially went back into NSR but then had recurrent episodes of atrial fibrillation.  He was also started on Eliquis  due to risk of embolic CVA, however he subsequently stopped Eliquis .   He was admitted in 6/22 with right PCA CVA and hemorrhagic conversion.  Initially just started on ASA, later this was stopped and Xarelto  started.  Main residual from CVA is mild left upper visual field impairment.   CTA neck showed < 50% bilateral carotid stenosis.  Echo showed EF 30-35%, diffuse hypokinesis.  RHC/LHC in 8/22 showed nonobstructive CAD and normal filling pressures.   Cardiac MRI in 11/22 showed LV EF 38%, RVEF 46%, basal inferolateral mid-wall LGE. Diffuse LGE images.  ECV 37-46%.  Based on cMRI, differential included prior myocarditis and cardiac amyloidosis.  PYP scan was done in 2/23, showing grade 1-2, H/CL 1.25.  [employer] gene testing for TTR mutations was negative. Based on full picture, TTR amyloidosis felt unlikely.   Patient had a hypoglycemic episode in 8/24, fell and fractured his ankle.  Echo in 8/24 showed EF 45-50%, normal RV, IVC normal.   Last visit was living at a rehab facility due ankle fracture. Non-weight-bearing for 12 weeks with orthopedic boot. Spironolactone  switched to eplerenone . Referral to nephrology for diabetic nephropathy with  hyperkalemia tendency.   Patient returns today for HF follow up with his sister. He is still living at [facility]  rehab facility for right ankle fracture, but fracture has resolved and he is weightbearing and ambulatory, struggling with LE wound healing. He is exercising at the gym, no SOB or chest pain. Notes one episode of dizziness, BP checked and in 90s. Weighted monthly at facility, around 200-202 lbs. Has not heard from nephrology yet. Denies edema, palpitations, orthopnea.   ECG (personally reviewed): NSR with PVC  Labs (2/23): K 4.1, creatinine 1.33, myeloma panel negative, LDL 68, TGs 58, hgb 12.8 Labs (5/23): K 4.8, creatinine 1.73, BNP 128 Labs (9/23): Hgb 12.3, creatinine 1.68, K 4.7 Labs (1/24): K 5.5, creatinine 1.63, glucose 467 Labs (3/24): LDL 104 Labs (8/24): K 3.7, creatinine 1.47, hgb 10.4 Labs (11/24): K 4.4, Scr 1.45, BNP 159, hgb 12.2  PMH: 1. HTN 2. Type I diabetes 3. Anemia, thrombocytopenia, leukopenia: related to chemotherapy.  4. Clavicular fracture 11/13 due to mechanical fall.  5. 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  FOLFOX chemotherapy 3/13 - 9/13.  7. Allergic rhinitis 8. Paroxysmal atrial fibrillation: First diagnosed in 11/13 during admission for DKA. Echo (11/13): EF 55% with grade I diastolic dysfunction.  9. Right central retinal artery occlusion 10. 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  - CTA neck with <50% carotid stenosis.  11. Cardiomyopathy: Nonischemic cardiomyopathy.  Echo in 6/22 with EF 30-35%, global hypokinesis, normal RV.   - RHC/LHC (8/22): 40% ostial/proximal LAD, D1 50%.  Mean RA 2, PA  29/9, mean PCWP 8, CI 2.94.  - Cardiac MRI (11/22): LV EF 38%, RVEF 46%, basal inferolateral mid-wall LGE. - PYP (2/23) showing grade 1-2, H/CL 1.25.  The PYP scan was equivocal.  - Echo (8/24): EF 45-50%, normal RV, IVC  normal.  12. COVID-19 2/23 13. OSA 14. CKD stage 3: Diabetic nephropathy  SH: Lives  in [place].  Quit smoking in 1995.  H/o marijuana.  Quit drinking ETOH in 2022.    FH: No atrial fibrillation or CAD.   ROS: All systems reviewed and negative except as per HPI.   Current Outpatient Medications  Medication Sig Dispense Refill   atorvastatin  (LIPITOR) 40 MG tablet TAKE ONE TABLET BY MOUTH ONCE DAILY. 90 tablet 3   carvedilol  (COREG ) 12.5 MG tablet Take 1 tablet (12.5 mg total) by mouth 2 (two) times daily with a meal. Dose change-updated dispill 180 tablet 3   Cholecalciferol  50 MCG (2000 UT) TABS Take 2 tablets by mouth daily.     eplerenone  (INSPRA ) 25 MG tablet Take 1 tablet (25 mg total) by mouth daily. 30 tablet 11   furosemide  (LASIX ) 40 MG tablet Take 1 tablet (40 mg total) by mouth daily in the afternoon. 90 tablet 3   GARLIC PO Take 1 capsule by mouth daily.     glucose blood test strip Use as instructed 100 each 12   insulin  aspart (NOVOLOG  FLEXPEN) 100 UNIT/ML FlexPen INJECT 20-30 UNITS INTO THE SKIN THREE TIMES DAILY WITH MEALS 15 mL 2   Insulin  Degludec (TRESIBA  Dillon) Inject 25 Units into the skin daily.     Insulin  Pen Needle 32G X 4 MM MISC Use as directed 150 each 2   Lancets MISC 1 Lancet by Does not apply route 3 (three) times daily. 100 each 3   levocetirizine (XYZAL ) 5 MG tablet Take 1 tablet (5 mg total) by mouth daily. 90 tablet 3   Multiple Vitamin (MULTIVITAMIN WITH MINERALS) TABS tablet Take 1 tablet by mouth daily. (0800)     rivaroxaban  (XARELTO ) 20 MG TABS tablet TAKE ONE TABLET BY MOUTH EVERY EVENING WITH SUPPER 30 tablet 11   ROCKLATAN  0.02-0.005 % SOLN Apply 0.5 drops to eye at bedtime.     sacubitril -valsartan  (ENTRESTO ) 24-26 MG Take 1 tablet by mouth 2 (two) times daily. 60 tablet 11   sodium zirconium cyclosilicate  (LOKELMA ) 10 g PACK packet Take 10 g by mouth daily. 90 packet 3   triamcinolone  (NASACORT ) 55 MCG/ACT AERO nasal inhaler INSTILL 1 SPRAY IN EACH NOSTRIL ONCE OR TWICE DAILY AS NEEDED. 16.5 Bottle 11   vitamin C  (ASCORBIC ACID ) 500  MG tablet Take 500 mg by mouth in the morning. (0800)     No current facility-administered medications for this encounter.   Wt Readings from Last 3 Encounters:  05/23/23 90.8 kg (200 lb 3.2 oz)  02/24/23 80.7 kg (178 lb)  12/08/22 85.5 kg (188 lb 7.9 oz)   BP (!) 102/58   Pulse 85   Ht 6' 2 (1.88 m)   Wt 90.8 kg   SpO2 99%   BMI 25.70 kg/m   PHYSICAL EXAM: General:  NAD. No resp difficulty, walked into clinic with walker, chronically-ill appearing HEENT: Normal Neck: Supple. No JVD. Cor: Regular rate & rhythm. No rubs, gallops or murmurs. Lungs: Clear Abdomen: Soft, nontender, nondistended.  Extremities: No cyanosis, clubbing, rash, 1+ RLE edema Neuro: Alert & oriented x 3, moves all 4 extremities w/o difficulty. Affect pleasant.  Assessment/Plan: 1. Atrial fibrillation:  Paroxysmal. Denies breakthrough  symptoms. He is in NSR with rare PVC today.  - Continue Coreg  12.5 mg bid. - Continue Xarelto  20 mg daily. No bleeding issues. CBC today. 2. Chronic systolic CHF: Nonischemic cardiomyopathy.  Echo in 6/22 at time of CVA showed EF 30-35%, diffuse hypokinesis.  LHC/RHC in 8/22 showed nonobstructive CAD and normal filling pressures.  Cardiac MRI in 11/22 showed LV EF 38%, RVEF 46%, basal inferolateral mid-wall LGE suggesting possible prior viral myocarditis vs cardiac amyloidosis.  PYP scan was equivocal but likely negative, [employer] gene testing negative for transthyretin mutation, myeloma panel was negative => suspect TTR cardiac amyloidosis is unlikely.  Prior viral myocarditis or a diabetic cardiomyopathy (poor control of blood glucose) are the most likely etiologies.  Echo in 8/24 showed EF 45-50%, normal RV, IVC  normal.  NYHA class II symptoms, not volume overloaded by exam.  GDMT titration limited by CKD and soft BP.  - Continue Coreg  12.5 mg bid. BP too soft for dose titration.  - Continue Entresto  24/26 bid.  He is on daily Lokelma  10 g to control K.  - Continue eplerenone  25  mg daily. BMET today. - Will have facility monitor BP once weekly and notify clinic if SBP consistently less than 100. - Continue Lasix  40 mg daily.  - No SGLT2 inhibitor with type 1 diabetes.  - EF out of range for ICD.  Narrow QRS so not CRT candidate.  3. Hyperlipidemia: On atorvastatin  40 mg daily.  - Lipids reviewed 02/27/23 and are stable. 4. CAD: Nonobstructive on cath 8/22. No chest pain.  - Continue statin.  - No ASA given Xarelto  use.  5. Type 1 diabetes: Glucose control seems poor and ankle wound not healing well.   - Have recommended seeing endocrinology in the past but patient declined. 6. CKD stage 3: Diabetic nephropathy with a tendency towards hyperkalemia.  - We have referred him to Nephrology, will check on status of referral to nephrology. - BMET today.  Follow up in 3-4 months with Dr. Rolan.   Harlene Gainer, FNP-BC 05/23/23

## 2023-06-05 ENCOUNTER — Ambulatory Visit: Payer: Medicare Other | Admitting: Orthopedic Surgery

## 2023-06-06 ENCOUNTER — Ambulatory Visit (INDEPENDENT_AMBULATORY_CARE_PROVIDER_SITE_OTHER): Payer: Medicare Other | Admitting: Orthopedic Surgery

## 2023-06-06 DIAGNOSIS — L97309 Non-pressure chronic ulcer of unspecified ankle with unspecified severity: Secondary | ICD-10-CM

## 2023-06-06 DIAGNOSIS — I87331 Chronic venous hypertension (idiopathic) with ulcer and inflammation of right lower extremity: Secondary | ICD-10-CM

## 2023-06-06 DIAGNOSIS — E13622 Other specified diabetes mellitus with other skin ulcer: Secondary | ICD-10-CM | POA: Diagnosis not present

## 2023-06-07 ENCOUNTER — Encounter: Payer: Self-pay | Admitting: Orthopedic Surgery

## 2023-06-07 NOTE — Progress Notes (Signed)
 Office Visit Note   Patient: Greg Robertson           Date of Birth: 07-19-1950           MRN: [id number] Visit Date: 06/06/2023              Requested by: Dettinger, Elige Radon, MD [address] [place],  [place] [address] PCP: Dettinger, Elige Radon, MD  Chief Complaint  Patient presents with   Right Ankle - Wound Check      HPI: Patient presents in follow-up status post Kerecis tissue graft for right ankle ulcer.  Patient is currently undergoing home health nursing dressing changes Wednesday Thursday Friday.  Assessment & Plan: Visit Diagnoses: No diagnosis found.  Plan: Patient will continue with current wound care follow-up in 2 weeks.  Anticipate the wound should be completely healed at follow-up.  Follow-Up Instructions: Return in about 2 weeks (around 06/20/2023).   Ortho Exam  Patient is alert, oriented, no adenopathy, well-dressed, normal affect, normal respiratory effort. Examination patient has dermatitis dry flaky skin the ulcer is almost completely healed it measures 1 x 2 mm.  It has hypergranulation tissue this was touched with silver nitrate there is no cellulitis no drainage no signs of infection.  Imaging: No results found.   Labs: Lab Results  Component Value Date   HGBA1C 9.2 (H) 12/08/2022   HGBA1C 9.9 (H) 10/27/2022   HGBA1C 12.3 (H) 07/21/2022   REPTSTATUS 03/11/2012 FINAL 03/10/2012   CULT NO GROWTH 03/10/2012     Lab Results  Component Value Date   ALBUMIN 3.1 (L) 12/08/2022   ALBUMIN 4.1 04/20/2022   ALBUMIN 3.9 03/24/2022    Lab Results  Component Value Date   MG 1.9 03/13/2012   MG 2.2 03/12/2012   MG 2.1 03/10/2012   Lab Results  Component Value Date   VD25OH 10.7 (L) 05/24/2018   VD25OH 14.1 (L) 01/17/2018   VD25OH 14.7 (L) 09/13/2017    No results found for: "PREALBUMIN"    Latest Ref Rng & Units 05/23/2023   10:17 AM 02/27/2023   12:35 PM 12/12/2022    4:59 AM  CBC EXTENDED  WBC 4.0 - 10.5 K/uL 7.7  7.4  6.9   RBC  4.22 - 5.81 MIL/uL 4.53  4.27  3.56   Hemoglobin 13.0 - 17.0 g/dL 62.1  30.8  65.7   HCT 39.0 - 52.0 % 39.0  37.1  30.7   Platelets 150 - 400 K/uL 173  159  156      There is no height or weight on file to calculate BMI.  Orders:  No orders of the defined types were placed in this encounter.  No orders of the defined types were placed in this encounter.    Procedures: No procedures performed  Clinical Data: No additional findings.  ROS:  All other systems negative, except as noted in the HPI. Review of Systems  Objective: Vital Signs: There were no vitals taken for this visit.  Specialty Comments:  No specialty comments available.  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   Mixed hyperlipidemia 08/22/2022   Chronic insomnia 11/24/2021   Retinopathy 11/24/2021   Central retinal vein occlusion, right eye, stable 10/25/2021   History of CVA (cerebrovascular accident) 09/27/2020  A-fib (HCC) 09/27/2020   Left homonymous hemianopsia 09/27/2020   COPD (chronic obstructive pulmonary disease) (HCC) 09/05/2017   Aortic atherosclerosis (HCC) 09/04/2017   Benign prostatic hyperplasia 09/04/2017   Hypertension, essential 03/14/2012   Chronic CHF (congestive heart failure) (HCC) 03/12/2012   Lynch syndrome 07/28/2011   History of colon cancer 05/25/2011   DM type 1 (diabetes mellitus, type 1) (HCC) 05/10/2011   Glaucoma 05/10/2011   Past Medical History:  Diagnosis Date   Allergic rhinitis    Anemia    Atrial fibrillation (HCC)    Persistent   Cancer of ascending colon, 7cm 05/25/2011   Congestive heart failure (CHF) (HCC)    Diabetes mellitus type I (HCC)    Glaucoma    Hypertension    Pneumonia 1979 or 1980   Prostate nodule    Substance  abuse (HCC)     Family History  Problem Relation Age of Onset   Breast cancer Mother    Pancreatitis Mother        intestinal adhesions   Insomnia Mother    Colon polyps Father    Lung cancer Father    Diabetes Father    Prostate cancer Father    Colon cancer Neg Hx    Rectal cancer Neg Hx    Esophageal cancer Neg Hx    Inflammatory bowel disease Neg Hx    Liver disease Neg Hx    Pancreatic cancer Neg Hx    Stomach cancer Neg Hx     Past Surgical History:  Procedure Laterality Date   broken left shoulder blade and collar bone     COLONOSCOPY WITH PROPOFOL N/A 04/29/2021   Procedure: COLONOSCOPY WITH PROPOFOL;  Surgeon: Rachael Fee, MD;  Location: [facility];  Service: Endoscopy;  Laterality: N/A;   ESOPHAGOGASTRODUODENOSCOPY (EGD) WITH PROPOFOL N/A 04/29/2021   Procedure: ESOPHAGOGASTRODUODENOSCOPY (EGD) WITH PROPOFOL;  Surgeon: Rachael Fee, MD;  Location: [facility];  Service: Endoscopy;  Laterality: N/A;   FINGER SURGERY  2009   right middle   HEMOSTASIS CLIP PLACEMENT  04/29/2021   Procedure: HEMOSTASIS CLIP PLACEMENT;  Surgeon: Rachael Fee, MD;  Location: [facility];  Service: Endoscopy;;   LAPAROSCOPIC ASSISTED 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     NASAL FRACTURE SURGERY  1968   ORIF ANKLE FRACTURE Right 12/08/2022   Procedure: OPEN REDUCTION INTERNAL FIXATION (ORIF) ANKLE FRACTURE;  Surgeon: London Sheer, MD;  Location: [facility];  Service: Orthopedics;  Laterality: Right;   POLYPECTOMY  04/29/2021   Procedure: POLYPECTOMY;  Surgeon: Rachael Fee, MD;  Location: [facility];  Service: Endoscopy;;   PORTACATH PLACEMENT  07/01/2011   Procedure: INSERTION PORT-A-CATH;  Surgeon: Ardeth Sportsman, MD;  Location: [facility];  Service: General;  Laterality: Left;  Insertion of Port-A-Catheter Left Internal Jugular   RIGHT/LEFT HEART CATH AND CORONARY ANGIOGRAPHY N/A 12/08/2020   Procedure: RIGHT/LEFT HEART CATH AND CORONARY ANGIOGRAPHY;  Surgeon: Laurey Morale, MD;  Location: [facility] INVASIVE CV LAB;  Service: Cardiovascular;  Laterality: N/A;   TONSILLECTOMY  1957 - approximate   Social History   Occupational History   Occupation: Retired    Comment: Airline pilot  Tobacco Use   Smoking status: Former    Current packs/day: 0.00    Average packs/day: 1 pack/day for 15.0 years (15.0 ttl pk-yrs)    Types: Cigarettes    Start date: 04/18/1978    Quit date: 04/18/1993    Years since quitting: 30.1   Smokeless tobacco: Never   Tobacco  comments:    marijuana every night   Vaping Use   Vaping status: Never Used  Substance and Sexual Activity   Alcohol use: Not Currently    Alcohol/week: 5.0 standard drinks of alcohol    Types: 3 Cans of beer, 2 Shots of liquor per week    Comment: 1 drink every 2 days   Drug use: Not Currently    Types: Marijuana    Comment: once a night marijuana   Sexual activity: Not Currently

## 2023-06-20 ENCOUNTER — Ambulatory Visit (INDEPENDENT_AMBULATORY_CARE_PROVIDER_SITE_OTHER): Payer: Medicare Other | Admitting: Orthopedic Surgery

## 2023-06-20 DIAGNOSIS — E13622 Other specified diabetes mellitus with other skin ulcer: Secondary | ICD-10-CM | POA: Diagnosis not present

## 2023-06-20 DIAGNOSIS — L97309 Non-pressure chronic ulcer of unspecified ankle with unspecified severity: Secondary | ICD-10-CM

## 2023-06-20 DIAGNOSIS — I87331 Chronic venous hypertension (idiopathic) with ulcer and inflammation of right lower extremity: Secondary | ICD-10-CM

## 2023-06-22 ENCOUNTER — Ambulatory Visit: Payer: Medicare Other | Admitting: Family Medicine

## 2023-06-25 ENCOUNTER — Encounter: Payer: Self-pay | Admitting: Orthopedic Surgery

## 2023-06-25 NOTE — Progress Notes (Signed)
 Office Visit Note   Patient: Greg Robertson           Date of Birth: 03-Sep-1950           MRN: [id number] Visit Date: 06/20/2023              Requested by: Dettinger, Elige Radon, MD [address] [place],  [place] [address] PCP: Dettinger, Elige Radon, MD  Chief Complaint  Patient presents with   Right Ankle - Wound Check      HPI: Patient is a 73 year old gentleman who presents in follow-up for venous insufficiency ulcer right ankle.  Patient currently undergoing silver cell dressing changes.  Assessment & Plan: Visit Diagnoses:  1. 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  Currently 2 x 5 mm.  Patient will start wearing the Vive wear compression socks and change every other day.  Follow-Up Instructions: Return in about 2 weeks (around 07/04/2023).   Ortho Exam  Patient is alert, oriented, no adenopathy, well-dressed, normal affect, normal respiratory effort. Examination the dermatitis continues to resolve and nonviable skin continues to flake off easily.  The ulcers are almost completely healed the remaining ulcer is 2 x 5 mm with healthy flat granulation tissue.  Imaging: No results found.   Labs: Lab Results  Component Value Date   HGBA1C 9.2 (H) 12/08/2022   HGBA1C 9.9 (H) 10/27/2022   HGBA1C 12.3 (H) 07/21/2022   REPTSTATUS 03/11/2012 FINAL 03/10/2012   CULT NO GROWTH 03/10/2012     Lab Results  Component Value Date   ALBUMIN 3.1 (L) 12/08/2022   ALBUMIN 4.1 04/20/2022   ALBUMIN 3.9 03/24/2022    Lab Results  Component Value Date   MG 1.9 03/13/2012   MG 2.2 03/12/2012   MG 2.1 03/10/2012   Lab Results  Component Value Date   VD25OH 10.7 (L) 05/24/2018   VD25OH 14.1 (L) 01/17/2018   VD25OH 14.7 (L) 09/13/2017    No results found for: "PREALBUMIN"    Latest Ref Rng & Units 05/23/2023   10:17 AM  02/27/2023   12:35 PM 12/12/2022    4:59 AM  CBC EXTENDED  WBC 4.0 - 10.5 K/uL 7.7  7.4  6.9   RBC 4.22 - 5.81 MIL/uL 4.53  4.27  3.56   Hemoglobin 13.0 - 17.0 g/dL 19.1  47.8  29.5   HCT 39.0 - 52.0 % 39.0  37.1  30.7   Platelets 150 - 400 K/uL 173  159  156      There is no height or weight on file to calculate BMI.  Orders:  No orders of the defined types were placed in this encounter.  No orders of the defined types were placed in this encounter.    Procedures: No procedures performed  Clinical Data: No additional findings.  ROS:  All other systems negative, except as noted in the HPI. Review of Systems  Objective: Vital Signs: There were no vitals taken for this visit.  Specialty Comments:  No specialty comments available.  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   Mixed hyperlipidemia 08/22/2022   Chronic insomnia 11/24/2021  Retinopathy 11/24/2021   Central retinal vein occlusion, right eye, stable 10/25/2021   History of CVA (cerebrovascular accident) 09/27/2020   A-fib (HCC) 09/27/2020   Left homonymous hemianopsia 09/27/2020   COPD (chronic obstructive pulmonary disease) (HCC) 09/05/2017   Aortic atherosclerosis (HCC) 09/04/2017   Benign prostatic hyperplasia 09/04/2017   Hypertension, essential 03/14/2012   Chronic CHF (congestive heart failure) (HCC) 03/12/2012   Lynch syndrome 07/28/2011   History of colon cancer 05/25/2011   DM type 1 (diabetes mellitus, type 1) (HCC) 05/10/2011   Glaucoma 05/10/2011   Past Medical History:  Diagnosis Date   Allergic rhinitis    Anemia    Atrial fibrillation (HCC)    Persistent   Cancer of ascending colon, 7cm 05/25/2011   Congestive heart failure (CHF) (HCC)    Diabetes mellitus  type I (HCC)    Glaucoma    Hypertension    Pneumonia 1979 or 1980   Prostate nodule    Substance abuse (HCC)     Family History  Problem Relation Age of Onset   Breast cancer Mother    Pancreatitis Mother        intestinal adhesions   Insomnia Mother    Colon polyps Father    Lung cancer Father    Diabetes Father    Prostate cancer Father    Colon cancer Neg Hx    Rectal cancer Neg Hx    Esophageal cancer Neg Hx    Inflammatory bowel disease Neg Hx    Liver disease Neg Hx    Pancreatic cancer Neg Hx    Stomach cancer Neg Hx     Past Surgical History:  Procedure Laterality Date   broken left shoulder blade and collar bone     COLONOSCOPY WITH PROPOFOL N/A 04/29/2021   Procedure: COLONOSCOPY WITH PROPOFOL;  Surgeon: Rachael Fee, MD;  [facility]: [facility];  Service: Endoscopy;  Laterality: N/A;   ESOPHAGOGASTRODUODENOSCOPY (EGD) WITH PROPOFOL N/A 04/29/2021   Procedure: ESOPHAGOGASTRODUODENOSCOPY (EGD) WITH PROPOFOL;  Surgeon: Rachael Fee, MD;  [facility];  Service: Endoscopy;  Laterality: N/A;   FINGER SURGERY  2009   right middle   HEMOSTASIS CLIP PLACEMENT  04/29/2021   Procedure: HEMOSTASIS CLIP PLACEMENT;  Surgeon: Rachael Fee, MD;  [facility]: [facility];  Service: Endoscopy;;   LAPAROSCOPIC ASSISTED 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     NASAL FRACTURE SURGERY  1968   ORIF ANKLE FRACTURE Right 12/08/2022   Procedure: OPEN REDUCTION INTERNAL FIXATION (ORIF) ANKLE FRACTURE;  Surgeon: London Sheer, MD;  Location: [facility];  Service: Orthopedics;  Laterality: Right;   POLYPECTOMY  04/29/2021   Procedure: POLYPECTOMY;  Surgeon: Rachael Fee, MD;  [facility];  Service: Endoscopy;;   PORTACATH PLACEMENT  07/01/2011   Procedure: INSERTION PORT-A-CATH;  Surgeon: Ardeth Sportsman, MD;  Location: [facility];  Service: General;  Laterality: Left;  Insertion of Port-A-Catheter Left Internal Jugular   RIGHT/LEFT HEART CATH AND CORONARY  ANGIOGRAPHY N/A 12/08/2020   Procedure: RIGHT/LEFT HEART CATH AND CORONARY ANGIOGRAPHY;  Surgeon: Laurey Morale, MD;  Location: [facility] INVASIVE CV LAB;  Service: Cardiovascular;  Laterality: N/A;   TONSILLECTOMY  1957 - approximate   Social History   Occupational History   Occupation: Retired    Comment: Airline pilot  Tobacco Use   Smoking status: Former    Current packs/day: 0.00    Average packs/day: 1 pack/day for 15.0 years (15.0 ttl pk-yrs)    Types: Cigarettes    Start date:  04/18/1978    Quit date: 04/18/1993    Years since quitting: 30.2   Smokeless tobacco: Never   Tobacco comments:    marijuana every night   Vaping Use   Vaping status: Never Used  Substance and Sexual Activity   Alcohol use: Not Currently    Alcohol/week: 5.0 standard drinks of alcohol    Types: 3 Cans of beer, 2 Shots of liquor per week    Comment: 1 drink every 2 days   Drug use: Not Currently    Types: Marijuana    Comment: once a night marijuana   Sexual activity: Not Currently

## 2023-06-28 ENCOUNTER — Encounter: Payer: Self-pay | Admitting: Family

## 2023-06-28 ENCOUNTER — Ambulatory Visit (INDEPENDENT_AMBULATORY_CARE_PROVIDER_SITE_OTHER): Admitting: Family

## 2023-06-28 DIAGNOSIS — L97309 Non-pressure chronic ulcer of unspecified ankle with unspecified severity: Secondary | ICD-10-CM

## 2023-06-28 DIAGNOSIS — I87331 Chronic venous hypertension (idiopathic) with ulcer and inflammation of right lower extremity: Secondary | ICD-10-CM

## 2023-06-28 DIAGNOSIS — E13622 Other specified diabetes mellitus with other skin ulcer: Secondary | ICD-10-CM | POA: Diagnosis not present

## 2023-06-28 NOTE — Progress Notes (Signed)
 Office Visit Note   Patient: Greg Robertson           Date of Birth: 1951-02-05           MRN: [id number] Visit Date: 06/28/2023              Requested by: Dettinger, Elige Radon, MD [address] [place],  [place] [address] PCP: Dettinger, Elige Radon, MD  Chief Complaint  Patient presents with   Right Ankle - Follow-up      HPI: The patient is a 73 year old gentleman who presents in follow-up for venous insufficiency ulcer to his right ankle medially this has healed.  For the last 1 week he had been using the medical compression stocking with direct skin contact.  He is concerned about some new scattered ulcers to the medial and lateral malleolus.  Feels these occurred from donning doffing the tight compression stocking  Assessment & Plan: Visit Diagnoses: No diagnosis found.  Plan: He prefers to resume alginate dressing changes with Ace for compression  Will [facility] out to [facility] to [facility].  He will follow-up with Dr. Lajoyce Corners in 1 week as scheduled  Follow-Up Instructions: No follow-ups on file.   Ortho Exam  Patient is alert, oriented, no adenopathy, well-dressed, normal affect, normal respiratory effort. On examination of the right lower extremity the dermatitis continues to be present there is no open ulcer.  The medial malleolus ankle ulcer is well-healed he now has some new scattered ulcers distal to this which are superficial and have 1 drop of serous drainage has similar scattered ulcers laterally there is no erythema no sign of infection  Imaging: No results found.     Labs: Lab Results  Component Value Date   HGBA1C 9.2 (H) 12/08/2022   HGBA1C 9.9 (H) 10/27/2022   HGBA1C 12.3 (H) 07/21/2022   REPTSTATUS 03/11/2012 FINAL 03/10/2012   CULT NO GROWTH 03/10/2012     Lab Results  Component Value Date   ALBUMIN 3.1 (L) 12/08/2022   ALBUMIN 4.1 04/20/2022   ALBUMIN 3.9 03/24/2022    Lab Results  Component Value Date   MG 1.9 03/13/2012   MG 2.2  03/12/2012   MG 2.1 03/10/2012   Lab Results  Component Value Date   VD25OH 10.7 (L) 05/24/2018   VD25OH 14.1 (L) 01/17/2018   VD25OH 14.7 (L) 09/13/2017    No results found for: "PREALBUMIN"    Latest Ref Rng & Units 05/23/2023   10:17 AM 02/27/2023   12:35 PM 12/12/2022    4:59 AM  CBC EXTENDED  WBC 4.0 - 10.5 K/uL 7.7  7.4  6.9   RBC 4.22 - 5.81 MIL/uL 4.53  4.27  3.56   Hemoglobin 13.0 - 17.0 g/dL 66.4  40.3  47.4   HCT 39.0 - 52.0 % 39.0  37.1  30.7   Platelets 150 - 400 K/uL 173  159  156      There is no height or weight on file to calculate BMI.  Orders:  No orders of the defined types were placed in this encounter.  No orders of the defined types were placed in this encounter.    Procedures: No procedures performed  Clinical Data: No additional findings.  ROS:  All other systems negative, except as noted in the HPI. Review of Systems  Objective: Vital Signs: There were no vitals taken for this visit.  Specialty Comments:  No specialty comments available.  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   Mixed hyperlipidemia 08/22/2022   Chronic insomnia 11/24/2021   Retinopathy 11/24/2021   Central retinal vein occlusion, right eye, stable 10/25/2021   History of CVA (cerebrovascular accident) 09/27/2020   A-fib (HCC) 09/27/2020   Left homonymous hemianopsia 09/27/2020   COPD (chronic obstructive pulmonary disease) (HCC) 09/05/2017   Aortic atherosclerosis (HCC) 09/04/2017   Benign prostatic hyperplasia 09/04/2017   Hypertension, essential 03/14/2012   Chronic CHF (congestive heart failure) (HCC) 03/12/2012   Lynch syndrome 07/28/2011   History of colon cancer 05/25/2011   DM type 1 (diabetes mellitus, type 1) (HCC) 05/10/2011    Glaucoma 05/10/2011   Past Medical History:  Diagnosis Date   Allergic rhinitis    Anemia    Atrial fibrillation (HCC)    Persistent   Cancer of ascending colon, 7cm 05/25/2011   Congestive heart failure (CHF) (HCC)    Diabetes mellitus type I (HCC)    Glaucoma    Hypertension    Pneumonia 1979 or 1980   Prostate nodule    Substance abuse (HCC)     Family History  Problem Relation Age of Onset   Breast cancer Mother    Pancreatitis Mother        intestinal adhesions   Insomnia Mother    Colon polyps Father    Lung cancer Father    Diabetes Father    Prostate cancer Father    Colon cancer Neg Hx    Rectal cancer Neg Hx    Esophageal cancer Neg Hx    Inflammatory bowel disease Neg Hx    Liver disease Neg Hx    Pancreatic cancer Neg Hx    Stomach cancer Neg Hx     Past Surgical History:  Procedure Laterality Date   broken left shoulder blade and collar bone     COLONOSCOPY WITH PROPOFOL N/A 04/29/2021   Procedure: COLONOSCOPY WITH PROPOFOL;  Surgeon: Rachael Fee, MD;  [facility]: [facility];  Service: Endoscopy;  Laterality: N/A;   ESOPHAGOGASTRODUODENOSCOPY (EGD) WITH PROPOFOL N/A 04/29/2021   Procedure: ESOPHAGOGASTRODUODENOSCOPY (EGD) WITH PROPOFOL;  Surgeon: Rachael Fee, MD;  [facility];  Service: Endoscopy;  Laterality: N/A;   FINGER SURGERY  2009   right middle   HEMOSTASIS CLIP PLACEMENT  04/29/2021   Procedure: HEMOSTASIS CLIP PLACEMENT;  Surgeon: Rachael Fee, MD;  [facility];  Service: Endoscopy;;   LAPAROSCOPIC ASSISTED 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     NASAL FRACTURE SURGERY  1968   ORIF ANKLE FRACTURE Right 12/08/2022   Procedure: OPEN REDUCTION INTERNAL FIXATION (ORIF) ANKLE FRACTURE;  Surgeon: London Sheer, MD;  Location: [facility];  Service: Orthopedics;  Laterality: Right;   POLYPECTOMY  04/29/2021   Procedure: POLYPECTOMY;  Surgeon: Rachael Fee, MD;  [facility];  Service: Endoscopy;;    PORTACATH PLACEMENT  07/01/2011   Procedure: INSERTION PORT-A-CATH;  Surgeon: Ardeth Sportsman, MD;  Location: [facility];  Service: General;  Laterality: Left;  Insertion of Port-A-Catheter Left Internal Jugular   RIGHT/LEFT HEART CATH AND CORONARY ANGIOGRAPHY N/A 12/08/2020   Procedure: RIGHT/LEFT HEART CATH AND CORONARY ANGIOGRAPHY;  Surgeon: Laurey Morale, MD;  Location: [facility] INVAS[facility] CV LAB;  Service: Cardiovascular;  Laterality: N/A;   TONSILLECTOMY  1957 - approximate   Social History  Occupational History   Occupation: Retired    Comment: Airline pilot  Tobacco Use   Smoking status: Former    Current packs/day: 0.00    Average packs/day: 1 pack/day for 15.0 years (15.0 ttl pk-yrs)    Types: Cigarettes    Start date: 04/18/1978    Quit date: 04/18/1993    Years since quitting: 30.2   Smokeless tobacco: Never   Tobacco comments:    marijuana every night   Vaping Use   Vaping status: Never Used  Substance and Sexual Activity   Alcohol use: Not Currently    Alcohol/week: 5.0 standard drinks of alcohol    Types: 3 Cans of beer, 2 Shots of liquor per week    Comment: 1 drink every 2 days   Drug use: Not Currently    Types: Marijuana    Comment: once a night marijuana   Sexual activity: Not Currently

## 2023-07-04 ENCOUNTER — Ambulatory Visit (INDEPENDENT_AMBULATORY_CARE_PROVIDER_SITE_OTHER): Admitting: Orthopedic Surgery

## 2023-07-04 DIAGNOSIS — E13622 Other specified diabetes mellitus with other skin ulcer: Secondary | ICD-10-CM | POA: Diagnosis not present

## 2023-07-04 DIAGNOSIS — I87331 Chronic venous hypertension (idiopathic) with ulcer and inflammation of right lower extremity: Secondary | ICD-10-CM

## 2023-07-04 DIAGNOSIS — L97309 Non-pressure chronic ulcer of unspecified ankle with unspecified severity: Secondary | ICD-10-CM

## 2023-07-09 ENCOUNTER — Encounter: Payer: Self-pay | Admitting: Orthopedic Surgery

## 2023-07-09 NOTE — Progress Notes (Signed)
 Office Visit Note   Patient: Greg Robertson           Date of Birth: May 03, 1950           MRN: [id number] Visit Date: 07/04/2023              Requested by: Dettinger, Elige Radon, MD [address] [place],  [place] [address] PCP: Dettinger, Elige Radon, MD  Chief Complaint  Patient presents with   Right Ankle - Follow-up      HPI: Patient is a 73 year old gentleman who presents with ulcerations right ankle.  Patient states the compression sock pulled off skin and made his ulcers worse.  Patient has been wearing the silver alginate dressing with Ace wrap for a week.  Assessment & Plan: Visit Diagnoses:  1. 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 Chronic venous hypertension (idiopathic) with ulcer and inflammation of right lower extremity (HCC)     Plan: Patient states that he is to discharge to home Saturday.  Discussed that he will need to be able to change the dressing on his own at least 3 times a week wash his leg with soap and water apply the silver cell and the Ace wrap.  Follow-Up Instructions: Return in about 2 weeks (around 07/18/2023).   Ortho Exam  Patient is alert, oriented, no adenopathy, well-dressed, normal affect, normal respiratory effort. Examination the scabs are removed the wounds are well-healed there is no cellulitis there is dermatitis from swelling with dry flaky skin.  Imaging: No results found.     Labs: Lab Results  Component Value Date   HGBA1C 9.2 (H) 12/08/2022   HGBA1C 9.9 (H) 10/27/2022   HGBA1C 12.3 (H) 07/21/2022   REPTSTATUS 03/11/2012 FINAL 03/10/2012   CULT NO GROWTH 03/10/2012     Lab Results  Component Value Date   ALBUMIN 3.1 (L) 12/08/2022   ALBUMIN 4.1 04/20/2022   ALBUMIN 3.9 03/24/2022    Lab Results  Component Value Date   MG 1.9 03/13/2012   MG 2.2 03/12/2012   MG 2.1 03/10/2012   Lab Results  Component Value Date   VD25OH 10.7 (L) 05/24/2018   VD25OH 14.1 (L) 01/17/2018   VD25OH 14.7 (L) 09/13/2017     No results found for: "PREALBUMIN"    Latest Ref Rng & Units 05/23/2023   10:17 AM 02/27/2023   12:35 PM 12/12/2022    4:59 AM  CBC EXTENDED  WBC 4.0 - 10.5 K/uL 7.7  7.4  6.9   RBC 4.22 - 5.81 MIL/uL 4.53  4.27  3.56   Hemoglobin 13.0 - 17.0 g/dL 19.1  47.8  29.5   HCT 39.0 - 52.0 % 39.0  37.1  30.7   Platelets 150 - 400 K/uL 173  159  156      There is no height or weight on file to calculate BMI.  Orders:  No orders of the defined types were placed in this encounter.  No orders of the defined types were placed in this encounter.    Procedures: No procedures performed  Clinical Data: No additional findings.  ROS:  All other systems negative, except as noted in the HPI. Review of Systems  Objective: Vital Signs: There were no vitals taken for this visit.  Specialty Comments:  No specialty comments available.  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   Mixed hyperlipidemia 08/22/2022   Chronic insomnia 11/24/2021   Retinopathy 11/24/2021   Central retinal vein occlusion, right eye, stable 10/25/2021   History of CVA (cerebrovascular accident) 09/27/2020   A-fib (HCC) 09/27/2020   Left homonymous hemianopsia 09/27/2020   COPD (chronic obstructive pulmonary disease) (HCC) 09/05/2017   Aortic atherosclerosis (HCC) 09/04/2017   Benign prostatic hyperplasia 09/04/2017   Hypertension, essential 03/14/2012   Chronic CHF (congestive heart failure) (HCC) 03/12/2012   Lynch syndrome 07/28/2011   History of colon cancer 05/25/2011   DM type 1 (diabetes mellitus, type 1) (HCC) 05/10/2011   Glaucoma 05/10/2011   Past Medical History:  Diagnosis Date   Allergic rhinitis    Anemia    Atrial fibrillation (HCC)    Persistent   Cancer of  ascending colon, 7cm 05/25/2011   Congestive heart failure (CHF) (HCC)    Diabetes mellitus type I (HCC)    Glaucoma    Hypertension    Pneumonia 1979 or 1980   Prostate nodule    Substance abuse (HCC)     Family History  Problem Relation Age of Onset   Breast cancer Mother    Pancreatitis Mother        intestinal adhesions   Insomnia Mother    Colon polyps Father    Lung cancer Father    Diabetes Father    Prostate cancer Father    Colon cancer Neg Hx    Rectal cancer Neg Hx    Esophageal cancer Neg Hx    Inflammatory bowel disease Neg Hx    Liver disease Neg Hx    Pancreatic cancer Neg Hx    Stomach cancer Neg Hx     Past Surgical History:  Procedure Laterality Date   broken left shoulder blade and collar bone     COLONOSCOPY WITH PROPOFOL N/A 04/29/2021   Procedure: COLONOSCOPY WITH PROPOFOL;  Surgeon: Rachael Fee, MD;  Location: [facility];  Service: Endoscopy;  Laterality: N/A;   ESOPHAGOGASTRODUODENOSCOPY (EGD) WITH PROPOFOL N/A 04/29/2021   Procedure: ESOPHAGOGASTRODUODENOSCOPY (EGD) WITH PROPOFOL;  Surgeon: Rachael Fee, MD;  Location: [facility];  Service: Endoscopy;  Laterality: N/A;   FINGER SURGERY  2009   right middle   HEMOSTASIS CLIP PLACEMENT  04/29/2021   Procedure: HEMOSTASIS CLIP PLACEMENT;  Surgeon: Rachael Fee, MD;  Location: [facility];  Service: Endoscopy;;   LAPAROSCOPIC ASSISTED 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     NASAL FRACTURE SURGERY  1968   ORIF ANKLE FRACTURE Right 12/08/2022   Procedure: OPEN REDUCTION INTERNAL FIXATION (ORIF) ANKLE FRACTURE;  Surgeon: London Sheer, MD;  Location: [facility];  Service: Orthopedics;  Laterality: Right;   POLYPECTOMY  04/29/2021   Procedure: POLYPECTOMY;  Surgeon: Rachael Fee, MD;  Location: [facility];  Service: Endoscopy;;   PORTACATH PLACEMENT  07/01/2011   Procedure: INSERTION PORT-A-CATH;  Surgeon: Ardeth Sportsman, MD;  Location: [facility];  Service: General;  Laterality:  Left;  Insertion of Port-A-Catheter Left Internal Jugular   RIGHT/LEFT HEART CATH AND CORONARY ANGIOGRAPHY N/A 12/08/2020   Procedure: RIGHT/LEFT HEART CATH AND CORONARY ANGIOGRAPHY;  Surgeon: Laurey Morale, MD;  Location: [facility] INVASIVE CV LAB;  Service: Cardiovascular;  Laterality: N/A;   TONSILLECTOMY  1957 - approximate   Social History   Occupational History   Occupation: Retired    Comment: Airline pilot  Tobacco Use   Smoking status: Former  Current packs/day: 0.00    Average packs/day: 1 pack/day for 15.0 years (15.0 ttl pk-yrs)    Types: Cigarettes    Start date: 04/18/1978    Quit date: 04/18/1993    Years since quitting: 30.2   Smokeless tobacco: Never   Tobacco comments:    marijuana every night   Vaping Use   Vaping status: Never Used  Substance and Sexual Activity   Alcohol use: Not Currently    Alcohol/week: 5.0 standard drinks of alcohol    Types: 3 Cans of beer, 2 Shots of liquor per week    Comment: 1 drink every 2 days   Drug use: Not Currently    Types: Marijuana    Comment: once a night marijuana   Sexual activity: Not Currently

## 2023-07-17 ENCOUNTER — Ambulatory Visit: Payer: Medicare Other | Admitting: Family Medicine

## 2023-07-20 ENCOUNTER — Ambulatory Visit (INDEPENDENT_AMBULATORY_CARE_PROVIDER_SITE_OTHER): Admitting: Orthopedic Surgery

## 2023-07-20 DIAGNOSIS — L97309 Non-pressure chronic ulcer of unspecified ankle with unspecified severity: Secondary | ICD-10-CM | POA: Diagnosis not present

## 2023-07-20 DIAGNOSIS — I87331 Chronic venous hypertension (idiopathic) with ulcer and inflammation of right lower extremity: Secondary | ICD-10-CM | POA: Diagnosis not present

## 2023-07-20 DIAGNOSIS — E13622 Other specified diabetes mellitus with other skin ulcer: Secondary | ICD-10-CM

## 2023-07-21 ENCOUNTER — Encounter: Payer: Self-pay | Admitting: Orthopedic Surgery

## 2023-07-21 NOTE — Progress Notes (Signed)
 Office Visit Note   Patient: Greg Robertson           Date of Birth: 03/24/1951           MRN: [id number] Visit Date: 07/20/2023              Requested by: Dettinger, Elige Radon, MD [address] [place],  [place] [address] PCP: Dettinger, Elige Radon, MD  Chief Complaint  Patient presents with   Right Ankle - Follow-up      HPI: Patient is a 73 year old gentleman who is status post tissue graft for venous insufficiency ulcer right ankle.  Assessment & Plan: Visit Diagnoses:  1. 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 Chronic venous hypertension (idiopathic) with ulcer and inflammation of right lower extremity (HCC)     Plan: The target study ulcer has healed.  Patient is currently at assisted living.  He will continue with using the alginate dry dressing and an Ace wrap.  Recommended patient try a extra-large compression sock.  Follow-Up Instructions: Return in about 2 weeks (around 08/03/2023).   Ortho Exam  Patient is alert, oriented, no adenopathy, well-dressed, normal affect, normal respiratory effort. Examination the target ulcer is healed.  He does have dry flaky skin but no cellulitis no drainage he has a palpable pulse.  Calf measures 37 cm in circumference.  Imaging: No results found.   Labs: Lab Results  Component Value Date   HGBA1C 9.2 (H) 12/08/2022   HGBA1C 9.9 (H) 10/27/2022   HGBA1C 12.3 (H) 07/21/2022   REPTSTATUS 03/11/2012 FINAL 03/10/2012   CULT NO GROWTH 03/10/2012     Lab Results  Component Value Date   ALBUMIN 3.1 (L) 12/08/2022   ALBUMIN 4.1 04/20/2022   ALBUMIN 3.9 03/24/2022    Lab Results  Component Value Date   MG 1.9 03/13/2012   MG 2.2 03/12/2012   MG 2.1 03/10/2012   Lab Results  Component Value Date   VD25OH 10.7 (L) 05/24/2018   VD25OH 14.1 (L) 01/17/2018   VD25OH 14.7 (L) 09/13/2017    No results found for: "PREALBUMIN"    Latest Ref Rng & Units 05/23/2023   10:17 AM 02/27/2023   12:35 PM 12/12/2022    4:59 AM   CBC EXTENDED  WBC 4.0 - 10.5 K/uL 7.7  7.4  6.9   RBC 4.22 - 5.81 MIL/uL 4.53  4.27  3.56   Hemoglobin 13.0 - 17.0 g/dL 78.2  95.6  21.3   HCT 39.0 - 52.0 % 39.0  37.1  30.7   Platelets 150 - 400 K/uL 173  159  156      There is no height or weight on file to calculate BMI.  Orders:  No orders of the defined types were placed in this encounter.  No orders of the defined types were placed in this encounter.    Procedures: No procedures performed  Clinical Data: No additional findings.  ROS:  All other systems negative, except as noted in the HPI. Review of Systems  Objective: Vital Signs: There were no vitals taken for this visit.  Specialty Comments:  No specialty comments available.  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   Mixed hyperlipidemia 08/22/2022   Chronic insomnia 11/24/2021   Retinopathy 11/24/2021  Central retinal vein occlusion, right eye, stable 10/25/2021   History of CVA (cerebrovascular accident) 09/27/2020   A-fib (HCC) 09/27/2020   Left homonymous hemianopsia 09/27/2020   COPD (chronic obstructive pulmonary disease) (HCC) 09/05/2017   Aortic atherosclerosis (HCC) 09/04/2017   Benign prostatic hyperplasia 09/04/2017   Hypertension, essential 03/14/2012   Chronic CHF (congestive heart failure) (HCC) 03/12/2012   Lynch syndrome 07/28/2011   History of colon cancer 05/25/2011   DM type 1 (diabetes mellitus, type 1) (HCC) 05/10/2011   Glaucoma 05/10/2011   Past Medical History:  Diagnosis Date   Allergic rhinitis    Anemia    Atrial fibrillation (HCC)    Persistent   Cancer of ascending colon, 7cm 05/25/2011   Congestive heart failure (CHF) (HCC)    Diabetes mellitus type I (HCC)    Glaucoma    Hypertension     Pneumonia 1979 or 1980   Prostate nodule    Substance abuse (HCC)     Family History  Problem Relation Age of Onset   Breast cancer Mother    Pancreatitis Mother        intestinal adhesions   Insomnia Mother    Colon polyps Father    Lung cancer Father    Diabetes Father    Prostate cancer Father    Colon cancer Neg Hx    Rectal cancer Neg Hx    Esophageal cancer Neg Hx    Inflammatory bowel disease Neg Hx    Liver disease Neg Hx    Pancreatic cancer Neg Hx    Stomach cancer Neg Hx     Past Surgical History:  Procedure Laterality Date   broken left shoulder blade and collar bone     COLONOSCOPY WITH PROPOFOL N/A 04/29/2021   Procedure: COLONOSCOPY WITH PROPOFOL;  Surgeon: Rachael Fee, MD;  [facility];  Service: Endoscopy;  Laterality: N/A;   ESOPHAGOGASTRODUODENOSCOPY (EGD) WITH PROPOFOL N/A 04/29/2021   Procedure: ESOPHAGOGASTRODUODENOSCOPY (EGD) WITH PROPOFOL;  Surgeon: Rachael Fee, MD;  [facility]: [facility];  Service: Endoscopy;  Laterality: N/A;   FINGER SURGERY  2009   right middle   HEMOSTASIS CLIP PLACEMENT  04/29/2021   Procedure: HEMOSTASIS CLIP PLACEMENT;  Surgeon: Rachael Fee, MD;  [facility];  Service: Endoscopy;;   LAPAROSCOPIC ASSISTED 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     NASAL FRACTURE SURGERY  1968   ORIF ANKLE FRACTURE Right 12/08/2022   Procedure: OPEN REDUCTION INTERNAL FIXATION (ORIF) ANKLE FRACTURE;  Surgeon: London Sheer, MD;  Location: [facility];  Service: Orthopedics;  Laterality: Right;   POLYPECTOMY  04/29/2021   Procedure: POLYPECTOMY;  Surgeon: Rachael Fee, MD;  [facility];  Service: Endoscopy;;   PORTACATH PLACEMENT  07/01/2011   Procedure: INSERTION PORT-A-CATH;  Surgeon: Ardeth Sportsman, MD;  Location: [facility];  Service: General;  Laterality: Left;  Insertion of Port-A-Catheter Left Internal Jugular   RIGHT/LEFT HEART CATH AND CORONARY ANGIOGRAPHY N/A 12/08/2020   Procedure:  RIGHT/LEFT HEART CATH AND CORONARY ANGIOGRAPHY;  Surgeon: Laurey Morale, MD;  Location: [facility] INVASIVE CV LAB;  Service: Cardiovascular;  Laterality: N/A;   TONSILLECTOMY  1957 - approximate   Social History   Occupational History   Occupation: Retired    Comment: Airline pilot  Tobacco Use   Smoking status: Former    Current packs/day: 0.00    Average packs/day: 1 pack/day for 15.0 years (15.0 ttl pk-yrs)    Types: Cigarettes    Start date: 04/18/1978  Quit date: 04/18/1993    Years since quitting: 30.2   Smokeless tobacco: Never   Tobacco comments:    marijuana every night   Vaping Use   Vaping status: Never Used  Substance and Sexual Activity   Alcohol use: Not Currently    Alcohol/week: 5.0 standard drinks of alcohol    Types: 3 Cans of beer, 2 Shots of liquor per week    Comment: 1 drink every 2 days   Drug use: Not Currently    Types: Marijuana    Comment: once a night marijuana   Sexual activity: Not Currently

## 2023-08-10 ENCOUNTER — Ambulatory Visit (INDEPENDENT_AMBULATORY_CARE_PROVIDER_SITE_OTHER): Admitting: Orthopedic Surgery

## 2023-08-10 DIAGNOSIS — I87331 Chronic venous hypertension (idiopathic) with ulcer and inflammation of right lower extremity: Secondary | ICD-10-CM

## 2023-08-10 DIAGNOSIS — E13622 Other specified diabetes mellitus with other skin ulcer: Secondary | ICD-10-CM | POA: Diagnosis not present

## 2023-08-10 DIAGNOSIS — L97309 Non-pressure chronic ulcer of unspecified ankle with unspecified severity: Secondary | ICD-10-CM

## 2023-08-14 ENCOUNTER — Telehealth: Payer: Self-pay | Admitting: Family Medicine

## 2023-08-14 NOTE — Telephone Encounter (Signed)
 Noted.

## 2023-08-21 ENCOUNTER — Ambulatory Visit: Admitting: Family Medicine

## 2023-08-29 ENCOUNTER — Encounter: Payer: Self-pay | Admitting: Orthopedic Surgery

## 2023-08-29 NOTE — Progress Notes (Signed)
 Office Visit Note   Patient: Greg Robertson           Date of Birth: April 01, 1951           MRN: [id number] Visit Date: 08/10/2023              Requested by: Dettinger, Lucio Sabin, MD [address] [place],  [place] [address] PCP: Dettinger, Lucio Sabin, MD  Chief Complaint  Patient presents with   Right Ankle - Follow-up      HPI: Patient is a 73 year old gentleman who presents in follow-up for [facility] tissue graft to right ankle ulceration.  Patient states the dressing has not been changed over the past 2 weeks since home health nursing stopped.  Assessment & Plan: Visit Diagnoses:  1. 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  Recommend patient continue using a 4 x 4 plus an Ace wrap change daily.  Follow-Up Instructions: Return in about 4 weeks (around 09/07/2023).   Ortho Exam  Patient is alert, oriented, no adenopathy, well-dressed, normal affect, normal respiratory effort. Examination multiple scabs were removed.  All the ulcers have healed the study ulcer has healed there is a new superficial ulceration laterally and proximally.  There is dry cracked skin without cellulitis.  Imaging: No results found.     Labs: Lab Results  Component Value Date   HGBA1C 9.2 (H) 12/08/2022   HGBA1C 9.9 (H) 10/27/2022   HGBA1C 12.3 (H) 07/21/2022   REPTSTATUS 03/11/2012 FINAL 03/10/2012   CULT NO GROWTH 03/10/2012     Lab Results  Component Value Date   ALBUMIN  3.1 (L) 12/08/2022   ALBUMIN  4.1 04/20/2022   ALBUMIN  3.9 03/24/2022    Lab Results  Component Value Date   MG 1.9 03/13/2012   MG 2.2 03/12/2012   MG 2.1 03/10/2012   Lab Results  Component Value Date   VD25OH 10.7 (L) 05/24/2018   VD25OH 14.1 (L) 01/17/2018   VD25OH 14.7 (L) 09/13/2017    No results found for: "PREALBUMIN"    Latest Ref Rng & Units 05/23/2023    10:17 AM 02/27/2023   12:35 PM 12/12/2022    4:59 AM  CBC EXTENDED  WBC 4.0 - 10.5 K/uL 7.7  7.4  6.9   RBC 4.22 - 5.81 MIL/uL 4.53  4.27  3.56   Hemoglobin 13.0 - 17.0 g/dL 74.2  59.5  63.8   HCT 39.0 - 52.0 % 39.0  37.1  30.7   Platelets 150 - 400 K/uL 173  159  156      There is no height or weight on file to calculate BMI.  Orders:  No orders of the defined types were placed in this encounter.  No orders of the defined types were placed in this encounter.    Procedures: No procedures performed  Clinical Data: No additional findings.  ROS:  All other systems negative, except as noted in the HPI. Review of Systems  Objective: Vital Signs: There were no vitals taken for this visit.  Specialty Comments:  No specialty comments available.  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   Mixed hyperlipidemia 08/22/2022  Chronic insomnia 11/24/2021   Retinopathy 11/24/2021   Central retinal vein occlusion, right eye, stable 10/25/2021   History of CVA (cerebrovascular accident) 09/27/2020   A-fib (HCC) 09/27/2020   Left homonymous hemianopsia 09/27/2020   COPD (chronic obstructive pulmonary disease) (HCC) 09/05/2017   Aortic atherosclerosis (HCC) 09/04/2017   Benign prostatic hyperplasia 09/04/2017   Hypertension, essential 03/14/2012   Chronic CHF (congestive heart failure) (HCC) 03/12/2012   Lynch syndrome 07/28/2011   History of colon cancer 05/25/2011   DM type 1 (diabetes mellitus, type 1) (HCC) 05/10/2011   Glaucoma 05/10/2011   Past Medical History:  Diagnosis Date   Allergic rhinitis    Anemia    Atrial fibrillation (HCC)    Persistent   Cancer of ascending colon, 7cm 05/25/2011   Congestive heart failure (CHF) (HCC)    Diabetes  mellitus type I (HCC)    Glaucoma    Hypertension    Pneumonia 1979 or 1980   Prostate nodule    Substance abuse (HCC)     Family History  Problem Relation Age of Onset   Breast cancer Mother    Pancreatitis Mother        intestinal adhesions   Insomnia Mother    Colon polyps Father    Lung cancer Father    Diabetes Father    Prostate cancer Father    Colon cancer Neg Hx    Rectal cancer Neg Hx    Esophageal cancer Neg Hx    Inflammatory bowel disease Neg Hx    Liver disease Neg Hx    Pancreatic cancer Neg Hx    Stomach cancer Neg Hx     Past Surgical History:  Procedure Laterality Date   broken left shoulder blade and collar bone     COLONOSCOPY WITH PROPOFOL  N/A 04/29/2021   Procedure: COLONOSCOPY WITH PROPOFOL ;  Surgeon: Janel Medford, MD;  [facility]: [facility];  Service: Endoscopy;  Laterality: N/A;   ESOPHAGOGASTRODUODENOSCOPY (EGD) WITH PROPOFOL  N/A 04/29/2021   Procedure: ESOPHAGOGASTRODUODENOSCOPY (EGD) WITH PROPOFOL ;  Surgeon: Janel Medford, MD;  [facility];  Service: Endoscopy;  Laterality: N/A;   FINGER SURGERY  2009   right middle   HEMOSTASIS CLIP PLACEMENT  04/29/2021   Procedure: HEMOSTASIS CLIP PLACEMENT;  Surgeon: Janel Medford, MD;  [facility];  Service: Endoscopy;;   LAPAROSCOPIC ASSISTED 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     NASAL FRACTURE SURGERY  1968   ORIF ANKLE FRACTURE Right 12/08/2022   Procedure: OPEN REDUCTION INTERNAL FIXATION (ORIF) ANKLE FRACTURE;  Surgeon: Diedra Fowler, MD;  Location: [facility];  Service: Orthopedics;  Laterality: Right;   POLYPECTOMY  04/29/2021   Procedure: POLYPECTOMY;  Surgeon: Janel Medford, MD;  [facility];  Service: Endoscopy;;   PORTACATH PLACEMENT  07/01/2011   Procedure: INSERTION PORT-A-CATH;  Surgeon: Eddye Goodie, MD;  Location: [facility];  Service: General;  Laterality: Left;  Insertion of Port-A-Catheter Left Internal Jugular   RIGHT/LEFT HEART CATH AND  CORONARY ANGIOGRAPHY N/A 12/08/2020   Procedure: RIGHT/LEFT HEART CATH AND CORONARY ANGIOGRAPHY;  Surgeon: Darlis Eisenmenger, MD;  Location: [facility] INVAS[facility] CV LAB;  Service: Cardiovascular;  Laterality: N/A;   TONSILLECTOMY  1957 - approximate   Social History   Occupational History   Occupation: Retired    Comment: Airline pilot  Tobacco Use   Smoking status: Former    Current packs/day: 0.00    Average packs/day: 1 pack/day for 15.0 years (15.0 ttl pk-yrs)    Types: Cigarettes  Start date: 04/18/1978    Quit date: 04/18/1993    Years since quitting: 30.3   Smokeless tobacco: Never   Tobacco comments:    marijuana every night   Vaping Use   Vaping status: Never Used  Substance and Sexual Activity   Alcohol use: Not Currently    Alcohol/week: 5.0 standard drinks of alcohol    Types: 3 Cans of beer, 2 Shots of liquor per week    Comment: 1 drink every 2 days   Drug use: Not Currently    Types: Marijuana    Comment: once a night marijuana   Sexual activity: Not Currently

## 2023-09-01 ENCOUNTER — Telehealth (HOSPITAL_COMMUNITY): Payer: Self-pay | Admitting: *Deleted

## 2023-09-01 NOTE — Telephone Encounter (Signed)
 Called to confirm/remind patient of their appointment at [facility] on 09/04/23.       Appointment:              [] Confirmed             [x] Left mess              [] No answer/No voice mail             [] Phone not in service   Patient reminded to bring all medications and/or complete list.   Confirmed patient has transportation. Gave directions, instructed to utilize valet parking.

## 2023-09-01 NOTE — Progress Notes (Signed)
 [facility] Note   PCP: Dettinger, Lucio Sabin, MD Cardiology: Dr. Mitzie Anda  73 y.o. with history of HTN, type I diabetes, and paroxysmal atrial fibrillation/CVA was referred by Dr. Lelia Putnam for evaluation of cardiomyopathy.  Patient also has had colon cancer with ileocolectomy in 2/13.  In 11/13, he fell and broke his clavicle (tripped).  Soon after this, he was admitted with DKA and profound dehydration/metabolic derangement.  He was noted to be in atrial fibrillation with RVR.  His cardiac enzymes were elevated, troponin peaked at 1.57. He initially went back into NSR but then had recurrent episodes of atrial fibrillation.  He was also started on Eliquis  due to risk of embolic CVA, however he subsequently stopped Eliquis .   He was admitted in 6/22 with right PCA CVA and hemorrhagic conversion.  Initially just started on ASA, later this was stopped and Xarelto  started.  Main residual from CVA is mild left upper visual field impairment.   CTA neck showed < 50% bilateral carotid stenosis.  Echo showed EF 30-35%, diffuse hypokinesis.  RHC/LHC in 8/22 showed nonobstructive CAD and normal filling pressures.   Cardiac MRI in 11/22 showed LV EF 38%, RVEF 46%, basal inferolateral mid-wall LGE. Diffuse LGE images.  ECV 37-46%.  Based on cMRI, differential included prior myocarditis and cardiac amyloidosis.  PYP scan was done in 2/23, showing grade 1-2, H/CL 1.25.  Inv[employer]ae gene testing for TTR mutations was negative. Based on full picture, TTR amyloidosis felt unlikely.   Patient had a hypoglycemic episode in 8/24, fell and fractured his ankle.  Echo in 8/24 showed EF 45-50%, normal RV, IVC normal.   Last visit was living at a rehab facility due ankle fracture. Non-weight-bearing for 12 weeks with orthopedic boot. Spironolactone  switched to eplerenone . Referral to nephrology for diabetic nephropathy with hyperkalemia tendency.   Today he returns for HF follow up. Overall feeling fine. He remains  at rehab at [facility] , plans on going back home next month. He is not SOB with activity, works out at gym at facility doing free [employer] and stationary bike. Working with PT. Follows with Dr. Julio Ohm for LE wound, says it is healing.Blood sugars better. Occasional positional dizziness, no falls. Denies palpitations, abnormal bleeding, CP, edema, or PND/Orthopnea. Appetite ok. Weight at home 202 pounds. Taking all medications provided by facility.  ReDs reading: 21%, abnormal  ECG (personally reviewed): none ordered today.  Labs (2/23): K 4.1, creatinine 1.33, myeloma panel negative, LDL 68, TGs 58, hgb 12.8 Labs (3/24): LDL 104 Labs (8/24): K 3.7, creatinine 1.47, hgb 10.4 Labs (11/24): K 4.4, creatinine 1.45, BNP 159, hgb 12.2, LDL 67 Labs (2/25): K 4.8, creatinine 1.44 Labs (5/25): K 4.1, creatinine 2.23  PMH: 1. HTN 2. Type I diabetes 3. Anemia, thrombocytopenia, leukopenia: related to chemotherapy.  4. Clavicular fracture 11/13 due to mechanical fall.  5. 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  FOLFOX chemotherapy 3/13 - 9/13.  7. Allergic rhinitis 8. Paroxysmal atrial fibrillation: First diagnosed in 11/13 during admission for DKA. Echo (11/13): EF 55% with grade I diastolic dysfunction.  9. Right central retinal artery occlusion 10. 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  - CTA neck with <50% carotid stenosis.  11. Cardiomyopathy: Nonischemic cardiomyopathy.  Echo in 6/22 with EF 30-35%, global hypokinesis, normal RV.   - RHC/LHC (8/22): 40% ostial/proximal LAD, D1 50%.  Mean RA 2, PA 29/9, mean PCWP 8, CI 2.94.  - Cardiac MRI (11/22): LV EF 38%, RVEF 46%,  basal inferolateral mid-wall LGE. - PYP (2/23) showing grade 1-2, H/CL 1.25.  The PYP scan was equivocal.  - Echo (8/24): EF 45-50%, normal RV, IVC  normal.  12. COVID-19 2/23 13. OSA 14. CKD stage 3: Diabetic nephropathy  SH: Lives in [place].  Quit smoking in 1995.  H/o marijuana.   Quit drinking ETOH in 2022.    FH: No atrial fibrillation or CAD.   ROS: All systems reviewed and negative except as per HPI.   Current Outpatient Medications  Medication Sig Dispense Refill   atorvastatin  (LIPITOR) 40 MG tablet TAKE ONE TABLET BY MOUTH ONCE DAILY. 90 tablet 3   carvedilol  (COREG ) 12.5 MG tablet Take 1 tablet (12.5 mg total) by mouth 2 (two) times daily with a meal. Dose change-updated dispill 180 tablet 3   Cholecalciferol  50 MCG (2000 UT) TABS Take 2 tablets by mouth daily.     eplerenone  (INSPRA ) 25 MG tablet Take 1 tablet (25 mg total) by mouth daily. 30 tablet 11   furosemide  (LASIX ) 40 MG tablet Take 1 tablet (40 mg total) by mouth daily in the afternoon. 90 tablet 3   GARLIC PO Take 1 capsule by mouth daily.     glucose blood test strip Use as instructed 100 each 12   insulin  aspart (NOVOLOG  FLEXPEN) 100 UNIT/ML FlexPen INJECT 20-30 UNITS INTO THE SKIN THREE TIMES DAILY WITH MEALS 15 mL 2   Insulin  Degludec (TRESIBA  [place]) Inject 25 Units into the skin daily.     Insulin  Pen Needle 32G X 4 MM MISC Use as directed 150 each 2   Lancets MISC 1 Lancet by Does not apply route 3 (three) times daily. 100 each 3   levocetirizine (XYZAL ) 5 MG tablet Take 1 tablet (5 mg total) by mouth daily. 90 tablet 3   Multiple Vitamin (MULTIVITAMIN WITH MINERALS) TABS tablet Take 1 tablet by mouth daily. (0800)     rivaroxaban  (XARELTO ) 20 MG TABS tablet TAKE ONE TABLET BY MOUTH EVERY EVENING WITH SUPPER 30 tablet 11   ROCKLATAN  0.02-0.005 % SOLN Apply 0.5 drops to eye at bedtime.     sacubitril -valsartan  (ENTRESTO ) 24-26 MG Take 1 tablet by mouth 2 (two) times daily. 60 tablet 11   sodium zirconium cyclosilicate  (LOKELMA ) 10 g PACK packet Take 10 g by mouth daily. 90 packet 3   UNABLE TO FIND Med Name: Pro-stat sugar free 32oz  Give (80) ML by Mouth Twice a day     vitamin C  (ASCORBIC ACID ) 500 MG tablet Take 500 mg by mouth in the morning. (0800)     triamcinolone  (NASACORT ) 55 MCG/ACT  AERO nasal inhaler INSTILL 1 SPRAY IN EACH NOSTRIL ONCE OR TWICE DAILY AS NEEDED. 16.5 Bottle 11   No current facility-administered medications for this encounter.   Wt Readings from Last 3 Encounters:  09/04/23 93.4 kg (205 lb 12.8 oz)  05/23/23 90.8 kg (200 lb 3.2 oz)  02/24/23 80.7 kg (178 lb)   BP (!) 100/56   Pulse 78   Wt 93.4 kg (205 lb 12.8 oz)   SpO2 96%   BMI 26.42 kg/m   PHYSICAL EXAM: General:  NAD. No resp difficulty, chronically-ill appearing, walked into clinic HEENT: Normal Neck: Supple. No JVD. Cor: Regular rate & rhythm. No rubs, gallops or murmurs. Lungs: Clear Abdomen: Soft, nontender, nondistended.  Extremities: No cyanosis, clubbing, rash, edema Neuro: Alert & oriented x 3, moves all 4 extremities w/o difficulty. Affect pleasant.  Assessment/Plan: 1. Atrial fibrillation:  Paroxysmal. Denies breakthrough symptoms.  Regular on exam today. - Continue Coreg  12.5 mg bid. - Continue Xarelto  20 mg daily. No bleeding issues.  2. Chronic systolic CHF: Nonischemic cardiomyopathy.  Echo in 6/22 at time of CVA showed EF 30-35%, diffuse hypokinesis.  LHC/RHC in 8/22 showed nonobstructive CAD and normal filling pressures.  Cardiac MRI in 11/22 showed LV EF 38%, RVEF 46%, basal inferolateral mid-wall LGE suggesting possible prior viral myocarditis vs cardiac amyloidosis.  PYP scan was equivocal but likely negative, Invitae gene testing negative for transthyretin mutation, myeloma panel was negative => suspect TTR cardiac amyloidosis is unlikely.  Prior viral myocarditis or a diabetic cardiomyopathy (poor control of blood glucose) are the most likely etiologies.  Echo in 8/24 showed EF 45-50%, normal RV, IVC  normal.  NYHA class I-II symptoms, not volume overloaded by exam, likely on the lower side with ReDs 21%.  GDMT titration limited by CKD and soft BP.  - Labs reviewed from facility 08/30/23, K 4.1, SCr 2.23 - With worsening renal function, change Lasix  to 40 mg PRN. Repeat  BMET in 2 weeks.  - Continue Coreg  12.5 mg bid. BP too soft for dose titration.  - Continue Entresto  24/26 bid.  He is on daily Lokelma  10 g to control K.  - Continue eplerenone  25 mg daily.  - No SGLT2 inhibitor with type 1 diabetes.  - EF out of range for ICD.  Narrow QRS so not CRT candidate.  - Update echo next visit 3. Hyperlipidemia: Continue atorvastatin  40 mg daily.  - Good lipids 11/24. 4. CAD: Nonobstructive on cath 8/22. No chest pain.  - Continue statin.  - No ASA given Xarelto  use.  5. Type 1 diabetes: Glucose control seems poor and ankle wound not healing well.   - Have recommended seeing endocrinology in the past but patient declined. 6. CKD stage 3: Diabetic nephropathy with a tendency towards hyperkalemia.  - We have referred him to Nephrology but he has not heard back. Will check on referral today.  Follow up in 3-4 months with Dr. Mitzie Anda + echo   [place], FNP-BC 09/04/23

## 2023-09-04 ENCOUNTER — Telehealth (HOSPITAL_COMMUNITY): Payer: Self-pay

## 2023-09-04 ENCOUNTER — Ambulatory Visit (HOSPITAL_COMMUNITY)
Admission: RE | Admit: 2023-09-04 | Discharge: 2023-09-04 | Disposition: A | Discharge status: Home / Self Care | Source: Ambulatory Visit | Attending: Family Medicine | Admitting: Family Medicine

## 2023-09-04 VITALS — BP 100/56 | HR 78 | Wt 205.8 lb

## 2023-09-04 DIAGNOSIS — N183 Chronic kidney disease, stage 3 unspecified: Secondary | ICD-10-CM | POA: Insufficient documentation

## 2023-09-04 DIAGNOSIS — I480 Paroxysmal atrial fibrillation: Secondary | ICD-10-CM | POA: Insufficient documentation

## 2023-09-04 DIAGNOSIS — Z7901 Long term (current) use of anticoagulants: Secondary | ICD-10-CM | POA: Insufficient documentation

## 2023-09-04 DIAGNOSIS — E785 Hyperlipidemia, unspecified: Secondary | ICD-10-CM

## 2023-09-04 DIAGNOSIS — Z87891 Personal history of nicotine dependence: Secondary | ICD-10-CM | POA: Diagnosis not present

## 2023-09-04 DIAGNOSIS — E1022 Type 1 diabetes mellitus with diabetic chronic kidney disease: Secondary | ICD-10-CM | POA: Diagnosis not present

## 2023-09-04 DIAGNOSIS — I130 Hypertensive heart and chronic kidney disease with heart failure and stage 1 through stage 4 chronic kidney disease, or unspecified chronic kidney disease: Secondary | ICD-10-CM | POA: Insufficient documentation

## 2023-09-04 DIAGNOSIS — I5022 Chronic systolic (congestive) heart failure: Secondary | ICD-10-CM | POA: Diagnosis not present

## 2023-09-04 DIAGNOSIS — I2510 Atherosclerotic heart disease of native coronary artery without angina pectoris: Secondary | ICD-10-CM | POA: Diagnosis not present

## 2023-09-04 DIAGNOSIS — Z794 Long term (current) use of insulin: Secondary | ICD-10-CM | POA: Insufficient documentation

## 2023-09-04 DIAGNOSIS — Z79899 Other long term (current) drug therapy: Secondary | ICD-10-CM | POA: Insufficient documentation

## 2023-09-04 DIAGNOSIS — E1021 Type 1 diabetes mellitus with diabetic nephropathy: Secondary | ICD-10-CM | POA: Diagnosis not present

## 2023-09-04 DIAGNOSIS — Z8673 Personal history of transient ischemic attack (TIA), and cerebral infarction without residual deficits: Secondary | ICD-10-CM | POA: Insufficient documentation

## 2023-09-04 DIAGNOSIS — E1059 Type 1 diabetes mellitus with other circulatory complications: Secondary | ICD-10-CM

## 2023-09-04 MED ORDER — FUROSEMIDE 40 MG PO TABS: 40.0000 mg | ORAL_TABLET | ORAL | 3 refills | Status: DC | PRN

## 2023-09-04 NOTE — Telephone Encounter (Signed)
 Spoke to pt's nurse Senita at [facility] and gave a verbal order per JM to change pt's lasix  medication to 40 mg PRN. In addition,  also spoke to and pt's  sister Amalia Badder. Labs placed and appointment scheduled.

## 2023-09-04 NOTE — Addendum Note (Signed)
 Encounter addended by: Dewey Fordyce, CMA on: 09/04/2023 1:31 PM  Actions taken: Order Reconciliation Section accessed

## 2023-09-04 NOTE — Progress Notes (Signed)
 ReDS Vest / Clip - 09/04/23 1000       ReDS Vest / Clip   Station Marker D    Ruler Value 32    ReDS Value Range Low volume    ReDS Actual Value 21

## 2023-09-04 NOTE — Patient Instructions (Addendum)
 Thank you for coming in today  If you had labs drawn today, any labs that are abnormal the clinic will call you No news is good news  Medications: No changes  Follow up appointments:  Your physician recommends that you schedule a follow-up appointment in:  3-4 months With Dr. Mitzie Anda with echocardiogram Please call our office to schedule the follow-up appointment in July for September 2025   Do the following things EVERYDAY: Weigh yourself in the morning before breakfast. Write it down and keep it in a log. Take your medicines as prescribed Eat low salt foods--Limit salt (sodium) to 2000 mg per day.  Stay as active as you can everyday Limit all fluids for the day to less than 2 liters   At the [facility], you and your health needs are our priority. As part of our continuing mission to provide you with exceptional heart care, we have created designated Provider Care Teams. These Care Teams include your primary Cardiologist (physician) and Advanced Practice Providers (APPs- Physician Assistants and Nurse Practitioners) who all work together to provide you with the care you need, when you need it.   You may see any of the following providers on your designated Care Team at your next follow up: Dr Jules Oar Dr Peder Bourdon Dr. Mimi Alt, NP Ruddy Corral, [place] [facility] [place], [place] Dennise Fitz, NP Luster Salters, PharmD   Please be sure to bring in all your medications bottles to every appointment.    Thank you for choosing [facility]  If you have any questions or concerns before your next appointment please send us  a message through [place] or call our office at [phone].    TO LEAVE A MESSAGE FOR THE NURSE SELECT OPTION 2, PLEASE LEAVE A MESSAGE INCLUDING: YOUR NAME DATE OF BIRTH CALL BACK NUMBER REASON FOR CALL**this is important as we prioritize the call backs  YOU WILL  RECEIVE A CALL BACK THE SAME DAY AS LONG AS YOU CALL BEFORE 4:00 PM

## 2023-09-05 ENCOUNTER — Ambulatory Visit (INDEPENDENT_AMBULATORY_CARE_PROVIDER_SITE_OTHER): Admitting: Orthopedic Surgery

## 2023-09-05 ENCOUNTER — Encounter: Payer: Self-pay | Admitting: Orthopedic Surgery

## 2023-09-05 DIAGNOSIS — E13622 Other specified diabetes mellitus with other skin ulcer: Secondary | ICD-10-CM | POA: Diagnosis not present

## 2023-09-05 DIAGNOSIS — L97309 Non-pressure chronic ulcer of unspecified ankle with unspecified severity: Secondary | ICD-10-CM | POA: Diagnosis not present

## 2023-09-05 DIAGNOSIS — I87331 Chronic venous hypertension (idiopathic) with ulcer and inflammation of right lower extremity: Secondary | ICD-10-CM

## 2023-09-05 NOTE — Progress Notes (Signed)
 Office Visit Note   Patient: Greg Robertson           Date of Birth: April 18, 1951           MRN: [id number] Visit Date: 09/05/2023              Requested by: Dettinger, Lucio Sabin, MD [id number] [place],  [place] [address] PCP: Dettinger, Lucio Sabin, MD  Chief Complaint  Patient presents with   Right Leg - Wound Check, Follow-up      HPI: Patient is a 73 year old gentleman who is status post treatment for venous ulcerations left leg.  Patient states he is still at assisted living he is applying an Ace wrap to the ankle and mid calf.  Assessment & Plan: Visit Diagnoses:  1. 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 Chronic venous hypertension (idiopathic) with ulcer and inflammation of right lower extremity (HCC)     Plan: Recommended continue compression.  Patient states he is unable to apply a compression sock.  Recommended wrapping the foot and ulcers from the metatarsal head to the tibial tubercle 4 x 4 gauze over the ulcers.  Follow-Up Instructions: Return in about 4 weeks (around 10/03/2023).   Ortho Exam  Patient is alert, oriented, no adenopathy, well-dressed, normal affect, normal respiratory effort. Examination patient has pitting edema in the calf and foot.  The ulcers on the calf are almost completely healed there is a few small areas of hypergranulation tissue about 5 mm in diameter that are touched with silver nitrate.  Imaging: No results found.    Labs: Lab Results  Component Value Date   HGBA1C 9.2 (H) 12/08/2022   HGBA1C 9.9 (H) 10/27/2022   HGBA1C 12.3 (H) 07/21/2022   REPTSTATUS 03/11/2012 FINAL 03/10/2012   CULT NO GROWTH 03/10/2012     Lab Results  Component Value Date   ALBUMIN  3.1 (L) 12/08/2022   ALBUMIN  4.1 04/20/2022   ALBUMIN  3.9 03/24/2022    Lab Results  Component Value Date   MG 1.9 03/13/2012   MG 2.2 03/12/2012   MG 2.1 03/10/2012   Lab Results  Component Value Date   VD25OH 10.7 (L) 05/24/2018   VD25OH 14.1 (L)  01/17/2018   VD25OH 14.7 (L) 09/13/2017    No results found for: "PREALBUMIN"    Latest Ref Rng & Units 05/23/2023   10:17 AM 02/27/2023   12:35 PM 12/12/2022    4:59 AM  CBC EXTENDED  WBC 4.0 - 10.5 K/uL 7.7  7.4  6.9   RBC 4.22 - 5.81 MIL/uL 4.53  4.27  3.56   Hemoglobin 13.0 - 17.0 g/dL 95.2  84.1  32.4   HCT 39.0 - 52.0 % 39.0  37.1  30.7   Platelets 150 - 400 K/uL 173  159  156      There is no height or weight on file to calculate BMI.  Orders:  No orders of the defined types were placed in this encounter.  No orders of the defined types were placed in this encounter.    Procedures: No procedures performed  Clinical Data: No additional findings.  ROS:  All other systems negative, except as noted in the HPI. Review of Systems  Objective: Vital Signs: There were no vitals taken for this visit.  Specialty Comments:  No specialty comments available.  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/27/2020   COPD (chronic obstructive pulmonary disease) (HCC) 09/05/2017   Aortic atherosclerosis (HCC) 09/04/2017   Benign prostatic hyperplasia 09/04/2017   Hypertension, essential 03/14/2012   Chronic CHF (congestive heart failure) (HCC) 03/12/2012   Lynch syndrome 07/28/2011   History of colon cancer 05/25/2011   DM type 1 (diabetes mellitus, type 1) (HCC) 05/10/2011   Glaucoma 05/10/2011   Past Medical History:  Diagnosis Date   Allergic rhinitis    Anemia    Atrial  fibrillation (HCC)    Persistent   Cancer of ascending colon, 7cm 05/25/2011   Congestive heart failure (CHF) (HCC)    Diabetes mellitus type I (HCC)    Glaucoma    Hypertension    Pneumonia 1979 or 1980   Prostate nodule    Substance abuse (HCC)     Family History  Problem Relation Age of Onset   Breast cancer Mother    Pancreatitis Mother        intestinal adhesions   Insomnia Mother    Colon polyps Father    Lung cancer Father    Diabetes Father    Prostate cancer Father    Colon cancer Neg Hx    Rectal cancer Neg Hx    Esophageal cancer Neg Hx    Inflammatory bowel disease Neg Hx    Liver disease Neg Hx    Pancreatic cancer Neg Hx    Stomach cancer Neg Hx     Past Surgical History:  Procedure Laterality Date   broken left shoulder blade and collar bone     COLONOSCOPY WITH PROPOFOL  N/A 04/29/2021   Procedure: COLONOSCOPY WITH PROPOFOL ;  Surgeon: Janel Medford, MD;  Location: [facility];  Service: Endoscopy;  Laterality: N/A;   ESOPHAGOGASTRODUODENOSCOPY (EGD) WITH PROPOFOL  N/A 04/29/2021   Procedure: ESOPHAGOGASTRODUODENOSCOPY (EGD) WITH PROPOFOL ;  Surgeon: Janel Medford, MD;  Location: [facility];  Service: Endoscopy;  Laterality: N/A;   FINGER SURGERY  2009   right middle   HEMOSTASIS CLIP PLACEMENT  04/29/2021   Procedure: HEMOSTASIS CLIP PLACEMENT;  Surgeon: Janel Medford, MD;  Location: [facility];  Service: Endoscopy;;   LAPAROSCOPIC ASSISTED 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     NASAL FRACTURE SURGERY  1968   ORIF ANKLE FRACTURE Right 12/08/2022   Procedure: OPEN REDUCTION INTERNAL FIXATION (ORIF) ANKLE FRACTURE;  Surgeon: Diedra Fowler, MD;  Location: [facility] OR;  Service: Orthopedics;  Laterality: Right;   POLYPECTOMY  04/29/2021   Procedure: POLYPECTOMY;  Surgeon: Janel Medford, MD;  Location: [facility];  Service: Endoscopy;;   PORTACATH PLACEMENT  07/01/2011   Procedure: INSERTION PORT-A-CATH;  Surgeon: Eddye Goodie, MD;   Location: [facility];  Service: General;  Laterality: Left;  Insertion of Port-A-Catheter Left Internal Jugular   RIGHT/LEFT HEART CATH AND CORONARY ANGIOGRAPHY N/A 12/08/2020   Procedure: RIGHT/LEFT HEART CATH AND CORONARY ANGIOGRAPHY;  Surgeon: Darlis Eisenmenger, MD;  Location: [facility] INVASIVE CV LAB;  Service: Cardiovascular;  Laterality: N/A;   TONSILLECTOMY  1957 - approximate   Social History   Occupational History   Occupation: Retired    Comment: Airline pilot  Tobacco Use   Smoking  status: Former    Current packs/day: 0.00    Average packs/day: 1 pack/day for 15.0 years (15.0 ttl pk-yrs)    Types: Cigarettes    Start date: 04/18/1978    Quit date: 04/18/1993    Years since quitting: 30.4   Smokeless tobacco: Never   Tobacco comments:    marijuana every night   Vaping Use   Vaping status: Never Used  Substance and Sexual Activity   Alcohol use: Not Currently    Alcohol/week: 5.0 standard drinks of alcohol    Types: 3 Cans of beer, 2 Shots of liquor per week    Comment: 1 drink every 2 days   Drug use: Not Currently    Types: Marijuana    Comment: once a night marijuana   Sexual activity: Not Currently

## 2023-09-07 ENCOUNTER — Other Ambulatory Visit (HOSPITAL_COMMUNITY): Payer: Self-pay | Admitting: Cardiology

## 2023-09-18 ENCOUNTER — Ambulatory Visit (HOSPITAL_COMMUNITY): Payer: Self-pay | Admitting: Family Medicine

## 2023-09-18 ENCOUNTER — Encounter: Payer: Self-pay | Admitting: Family Medicine

## 2023-09-18 ENCOUNTER — Ambulatory Visit (HOSPITAL_COMMUNITY)
Admission: RE | Admit: 2023-09-18 | Discharge: 2023-09-18 | Disposition: A | Discharge status: Home / Self Care | Source: Ambulatory Visit | Attending: Cardiology | Admitting: Cardiology

## 2023-09-18 DIAGNOSIS — I5022 Chronic systolic (congestive) heart failure: Secondary | ICD-10-CM | POA: Diagnosis present

## 2023-09-18 LAB — BASIC METABOLIC PANEL WITH GFR
Anion gap: 4 — ABNORMAL LOW (ref 5–15)
BUN: 39 mg/dL — ABNORMAL HIGH (ref 8–23)
CO2: 26 mmol/L (ref 22–32)
Calcium: 8.9 mg/dL (ref 8.9–10.3)
Chloride: 107 mmol/L (ref 98–111)
Creatinine, Ser: 1.72 mg/dL — ABNORMAL HIGH (ref 0.61–1.24)
GFR, Estimated: 42 mL/min — ABNORMAL LOW (ref >60)
Glucose, Bld: 149 mg/dL — ABNORMAL HIGH (ref 70–99)
Potassium: 4.3 mmol/L (ref 3.5–5.1)
Sodium: 137 mmol/L (ref 135–145)

## 2023-09-18 LAB — HM DIABETES EYE EXAM

## 2023-09-20 ENCOUNTER — Ambulatory Visit (INDEPENDENT_AMBULATORY_CARE_PROVIDER_SITE_OTHER): Admitting: Family Medicine

## 2023-09-20 ENCOUNTER — Encounter: Payer: Self-pay | Admitting: Family Medicine

## 2023-09-20 VITALS — BP 109/65 | HR 73 | Temp 97.1°F | Ht 74.0 in | Wt 206.0 lb

## 2023-09-20 DIAGNOSIS — E1069 Type 1 diabetes mellitus with other specified complication: Secondary | ICD-10-CM

## 2023-09-20 DIAGNOSIS — I480 Paroxysmal atrial fibrillation: Secondary | ICD-10-CM

## 2023-09-20 DIAGNOSIS — I5042 Chronic combined systolic (congestive) and diastolic (congestive) heart failure: Secondary | ICD-10-CM | POA: Diagnosis not present

## 2023-09-20 DIAGNOSIS — I10 Essential (primary) hypertension: Secondary | ICD-10-CM | POA: Diagnosis not present

## 2023-09-20 DIAGNOSIS — E782 Mixed hyperlipidemia: Secondary | ICD-10-CM

## 2023-09-20 DIAGNOSIS — E785 Hyperlipidemia, unspecified: Secondary | ICD-10-CM

## 2023-09-20 LAB — CMP14+EGFR
ALT: 20 IU/L (ref 0–44)
AST: 22 IU/L (ref 0–40)
Albumin: 4.1 g/dL (ref 3.8–4.8)
Alkaline Phosphatase: 127 IU/L — ABNORMAL HIGH (ref 44–121)
BUN/Creatinine Ratio: 27 — ABNORMAL HIGH (ref 10–24)
BUN: 56 mg/dL — ABNORMAL HIGH (ref 8–27)
Bilirubin Total: 0.5 mg/dL (ref 0.0–1.2)
CO2: 18 mmol/L — ABNORMAL LOW (ref 20–29)
Calcium: 8.6 mg/dL (ref 8.6–10.2)
Chloride: 104 mmol/L (ref 96–106)
Creatinine, Ser: 2.06 mg/dL — ABNORMAL HIGH (ref 0.76–1.27)
Globulin, Total: 2.2 g/dL (ref 1.5–4.5)
Glucose: 340 mg/dL — ABNORMAL HIGH (ref 70–99)
Potassium: 4.6 mmol/L (ref 3.5–5.2)
Sodium: 138 mmol/L (ref 134–144)
Total Protein: 6.3 g/dL (ref 6.0–8.5)
eGFR: 34 mL/min/{1.73_m2} — ABNORMAL LOW (ref >59)

## 2023-09-20 LAB — CBC WITH DIFFERENTIAL/PLATELET
Basophils Absolute: 0 10*3/uL (ref 0.0–0.2)
Basos: 0 %
EOS (ABSOLUTE): 0.2 10*3/uL (ref 0.0–0.4)
Eos: 3 %
Hematocrit: 39 % (ref 37.5–51.0)
Hemoglobin: 12.4 g/dL — ABNORMAL LOW (ref 13.0–17.7)
Immature Grans (Abs): 0 10*3/uL (ref 0.0–0.1)
Immature Granulocytes: 0 %
Lymphocytes Absolute: 1.9 10*3/uL (ref 0.7–3.1)
Lymphs: 24 %
MCH: 29 pg (ref 26.6–33.0)
MCHC: 31.8 g/dL (ref 31.5–35.7)
MCV: 91 fL (ref 79–97)
Monocytes Absolute: 0.8 10*3/uL (ref 0.1–0.9)
Monocytes: 10 %
Neutrophils Absolute: 4.8 10*3/uL (ref 1.4–7.0)
Neutrophils: 63 %
Platelets: 159 10*3/uL (ref 150–450)
RBC: 4.27 x10E6/uL (ref 4.14–5.80)
RDW: 13.5 % (ref 11.6–15.4)
WBC: 7.7 10*3/uL (ref 3.4–10.8)

## 2023-09-20 LAB — LIPID PANEL
Chol/HDL Ratio: 2.7 ratio (ref 0.0–5.0)
Cholesterol, Total: 131 mg/dL (ref 100–199)
HDL: 48 mg/dL (ref >39)
LDL Chol Calc (NIH): 65 mg/dL (ref 0–99)
Triglycerides: 96 mg/dL (ref 0–149)
VLDL Cholesterol Cal: 18 mg/dL (ref 5–40)

## 2023-09-20 LAB — BAYER DCA HB A1C WAIVED: HB A1C (BAYER DCA - WAIVED): 9 % — ABNORMAL HIGH (ref 4.8–5.6)

## 2023-09-20 MED ORDER — CARVEDILOL 12.5 MG PO TABS: 12.5000 mg | ORAL_TABLET | Freq: Two times a day (BID) | ORAL | 3 refills | Status: DC

## 2023-09-20 MED ORDER — FREESTYLE LIBRE 3 PLUS SENSOR MISC: 1 refills | Status: AC

## 2023-09-20 MED ORDER — FREESTYLE LIBRE 3 READER DEVI: 1.0000 | Freq: Four times a day (QID) | 1 refills | Status: AC

## 2023-09-20 MED ORDER — NOVOLOG FLEXPEN 100 UNIT/ML ~~LOC~~ SOPN
20.0000 [IU] | PEN_INJECTOR | Freq: Three times a day (TID) | SUBCUTANEOUS | 2 refills | Status: AC
Start: 2023-09-20 — End: ?

## 2023-09-20 NOTE — Addendum Note (Signed)
 Addended by: Jolyne Needs on: 09/20/2023 11:51 AM   Modules accepted: Orders

## 2023-09-20 NOTE — Progress Notes (Addendum)
 BP 109/65   Pulse 73   Temp (!) 97.1 F (36.2 C)   Ht 6\' 2"  (1.88 m)   Wt 206 lb (93.4 kg)   SpO2 100%   BMI 26.45 kg/m    Subjective:   Patient ID: Greg Robertson, male    DOB: Dec 30, 1950, 73 y.o.   MRN: [id number]  HPI: Greg Robertson is a 73 y.o. male presenting on 09/20/2023 for Medical Management of Chronic Issues and Diabetes   HPI Type 1 diabetes mellitus Patient comes in today for recheck of his diabetes. Patient has been currently taking Tresiba  and NovoLog . Patient is currently on an ACE inhibitor/ARB. Patient has not seen an ophthalmologist this year. Patient denies any new issues with their feet. The symptom started onset as an adult hypertension and CHF and A-fib and hyperlipidemia ARE RELATED TO DM   Hypertension and CHF and A-fib Patient is currently on furosemide  as needed and Entresto  and carvedilol , and their blood pressure today is 109/65. Patient denies any lightheadedness or dizziness. Patient denies headaches, blurred vision, chest pains, shortness of breath, or weakness. Denies any side effects from medication and is content with current medication.   Hyperlipidemia Patient is coming in for recheck of his hyperlipidemia. The patient is currently taking atorvastatin . They deny any issues with myalgias or history of liver damage from it. They deny any focal numbness or weakness or chest pain.   Relevant past medical, surgical, family and social history reviewed and updated as indicated. Interim medical history since our last visit reviewed. Allergies and medications reviewed and updated.  Review of Systems  Constitutional:  Negative for chills and fever.  Eyes:  Negative for visual disturbance.  Respiratory:  Negative for shortness of breath and wheezing.   Cardiovascular:  Negative for chest pain and leg swelling.  Musculoskeletal:  Negative for back pain and gait problem.  Skin:  Negative for rash.  Neurological:  Negative for dizziness and  light-headedness.  All other systems reviewed and are negative.   Per HPI unless specifically indicated above   Allergies as of 09/20/2023       Reactions   Oxycodone  Nausea And Vomiting        Medication List        Accurate as of September 20, 2023 11:51 AM. If you have any questions, ask your nurse or doctor.          ascorbic acid  500 MG tablet Commonly known as: VITAMIN C  Take 500 mg by mouth in the morning. (0800)   atorvastatin  40 MG tablet Commonly known as: LIPITOR TAKE ONE TABLET BY MOUTH ONCE DAILY.   carvedilol  12.5 MG tablet Commonly known as: COREG  Take 1 tablet (12.5 mg total) by mouth 2 (two) times daily with a meal. Dose change-updated dispill   Cholecalciferol  50 MCG (2000 UT) Tabs Take 2 tablets by mouth daily.   Entresto  24-26 MG Generic drug: sacubitril -valsartan  Take 1 tablet by mouth 2 (two) times daily.   eplerenone  25 MG tablet Commonly known as: INSPRA  Take 1 tablet (25 mg total) by mouth daily.   FreeStyle Libre 3 Plus Sensor Misc Change sensor every 15 days. Started by: Lucio Sabin Yanice Maqueda   FreeStyle Libre 3 Reader Devi 1 each by Does not apply route 4 (four) times daily. Started by: Lucio Sabin Citlalic Norlander   furosemide  40 MG tablet Commonly known as: LASIX  Take 1 tablet (40 mg total) by mouth as needed.   GARLIC PO Take 1 capsule by mouth daily.  glucose blood test strip Use as instructed   Insulin  Pen Needle 32G X 4 MM Misc Use as directed   Lancets Misc 1 Lancet by Does not apply route 3 (three) times daily.   levocetirizine 5 MG tablet Commonly known as: XYZAL  Take 1 tablet (5 mg total) by mouth daily.   Lokelma  10 g Pack packet Generic drug: sodium zirconium cyclosilicate  Take 10 g by mouth daily.   multivitamin with minerals Tabs tablet Take 1 tablet by mouth daily. (0800)   NovoLOG  FlexPen 100 UNIT/ML FlexPen Generic drug: insulin  aspart Inject 20-30 Units into the skin 3 (three) times daily with meals. What  changed: See the new instructions. Changed by: Lucio Sabin Marcelle Hepner   Pro-Stat Liqd Take 8 mLs by mouth daily.   rivaroxaban  20 MG Tabs tablet Commonly known as: Xarelto  TAKE ONE TABLET BY MOUTH EVERY EVENING WITH SUPPER   Rocklatan  0.02-0.005 % Soln Generic drug: Netarsudil -Latanoprost  Apply 0.5 drops to eye at bedtime.   TRESIBA  [place] Inject 25 Units into the skin daily.   triamcinolone  55 MCG/ACT Aero nasal inhaler Commonly known as: NASACORT  INSTILL 1 SPRAY IN EACH NOSTRIL ONCE OR TWICE DAILY AS NEEDED.   UNABLE TO FIND Med Name: Pro-stat sugar free 32oz  Give (80) ML by Mouth Twice a day         Objective:   BP 109/65   Pulse 73   Temp (!) 97.1 F (36.2 C)   Ht 6\' 2"  (1.88 m)   Wt 206 lb (93.4 kg)   SpO2 100%   BMI 26.45 kg/m   Wt Readings from Last 3 Encounters:  09/20/23 206 lb (93.4 kg)  09/04/23 205 lb 12.8 oz (93.4 kg)  05/23/23 200 lb 3.2 oz (90.8 kg)    Physical Exam Vitals and nursing note reviewed.  Constitutional:      General: He is not in acute distress.    Appearance: He is well-developed. He is not diaphoretic.  Eyes:     General: No scleral icterus.    Conjunctiva/sclera: Conjunctivae normal.  Neck:     Thyroid : No thyromegaly.  Cardiovascular:     Rate and Rhythm: Normal rate and regular rhythm.     Heart sounds: Normal heart sounds. No murmur heard. Pulmonary:     Effort: Pulmonary effort is normal. No respiratory distress.     Breath sounds: Normal breath sounds. No wheezing.  Musculoskeletal:        General: Swelling (Bilateral lower extremity 1+ edema) present. Normal range of motion.     Cervical back: Neck supple.  Lymphadenopathy:     Cervical: No cervical adenopathy.  Skin:    General: Skin is warm and dry.     Findings: No rash.     Comments: Right lower extremity is bandaged and wrapped.  Sees wound care for this.  Neurological:     Mental Status: He is alert and oriented to person, place, and time.     Coordination:  Coordination normal.  Psychiatric:        Behavior: Behavior normal.       Assessment & Plan:   Problem List Items Addressed This Visit       Cardiovascular and Mediastinum   Hypertension, essential   Relevant Medications   carvedilol  (COREG ) 12.5 MG tablet   Other Relevant Orders   Bayer DCA Hb A1c Waived   CBC with Differential/Platelet   CMP14+EGFR   Lipid panel   AMB Referral VBCI Care Management   A-fib (HCC) (Chronic)  Relevant Medications   carvedilol  (COREG ) 12.5 MG tablet   Chronic CHF (congestive heart failure) (HCC)   Relevant Medications   carvedilol  (COREG ) 12.5 MG tablet   Other Relevant Orders   AMB Referral VBCI Care Management     Endocrine   DM type 1 (diabetes mellitus, type 1) (HCC) - Primary (Chronic)   Relevant Medications   Continuous Glucose Sensor (FREESTYLE LIBRE 3 PLUS SENSOR) MISC   Continuous Glucose Receiver (FREESTYLE LIBRE 3 READER) DEVI   insulin  aspart (NOVOLOG  FLEXPEN) 100 UNIT/ML FlexPen   Other Relevant Orders   Bayer DCA Hb A1c Waived   CBC with Differential/Platelet   CMP14+EGFR   Lipid panel   Microalbumin/Creatinine Ratio, Urine   AMB Referral VBCI Care Management     Other   Mixed hyperlipidemia   Relevant Medications   carvedilol  (COREG ) 12.5 MG tablet   Other Visit Diagnoses       Hyperlipidemia due to type 1 diabetes mellitus (HCC)       Relevant Medications   carvedilol  (COREG ) 12.5 MG tablet   insulin  aspart (NOVOLOG  FLEXPEN) 100 UNIT/ML FlexPen   Other Relevant Orders   Bayer DCA Hb A1c Waived   CBC with Differential/Platelet   CMP14+EGFR   Lipid panel   AMB Referral VBCI Care Management       A1c is up at 9.0, it sounds like his been doing sliding scale and not covering his meals as well.  He has gotten occasional low blood sugars.  Lower Tresiba  down to 22 units daily and instructed on how to better cover his mealtime insulin  and sugar coverage.  He has had a fall and a fracture and was at an  assisted living facility.  They are still working on the wounds on his right lower extremity right now. Discussed doing physical therapy and as long as they get cleared with the wound care doctor then we will start physical therapy.  This office visit was a visit to discuss patient's diabetic management and because she is out of control and using insulin  4 times daily and having to check her blood sugars 4 times daily I believe she would be a good candidate for a continuous subcutaneous glucose monitor such as freestyle libre.  Follow up plan: Return in about 3 months (around 12/21/2023), or if symptoms worsen or fail to improve, for Diabetes recheck.  Counseling provided for all of the vaccine components Orders Placed This Encounter  Procedures   Bayer DCA Hb A1c Waived   CBC with Differential/Platelet   CMP14+EGFR   Lipid panel   Microalbumin/Creatinine Ratio, Urine   AMB Referral VBCI Care Management    Jolyne Needs, MD [facility] 09/20/2023, 11:51 AM

## 2023-09-25 ENCOUNTER — Telehealth: Payer: Self-pay

## 2023-09-25 NOTE — Progress Notes (Signed)
 Care Guide Pharmacy Note  09/25/2023 Name: HERMES WAFER MRN: [id number] DOB: [birth year]  Referred By: Hilton Lucky, MD Reason for referral: Complex Care Management (Outreach to schedule with Pharm d )   ALONTE WULFF is a 73 y.o. year old male who is a primary care patient of Dettinger, Lucio Sabin, MD.  Atticus D Peart was referred to the pharmacist for assistance related to: DMII  Successful contact was made with the patient to discuss pharmacy services including being ready for the pharmacist to call at least 5 minutes before the scheduled appointment time and to have medication bottles and any blood pressure readings ready for review. The patient agreed to meet with the pharmacist via telephone visit on (date/time).10/19/2023  Lenton Rail , RMA     [place], [facility] Guide  Direct Dial: [phone]  Website: [email or web address]

## 2023-09-27 ENCOUNTER — Telehealth: Payer: Self-pay

## 2023-09-27 ENCOUNTER — Ambulatory Visit: Payer: Self-pay | Admitting: Family Medicine

## 2023-09-27 DIAGNOSIS — N289 Disorder of kidney and ureter, unspecified: Secondary | ICD-10-CM

## 2023-09-27 NOTE — Telephone Encounter (Signed)
 Copied from CRM [id number]. Topic: General - Other >> Sep 27, 2023  4:16 PM Adrianna P wrote: Reason for CRM: Returning missed call for lab results, please call patient

## 2023-09-28 ENCOUNTER — Telehealth (HOSPITAL_COMMUNITY): Payer: Self-pay | Admitting: Cardiology

## 2023-09-28 DIAGNOSIS — I480 Paroxysmal atrial fibrillation: Secondary | ICD-10-CM

## 2023-09-28 MED ORDER — ENTRESTO 24-26 MG PO TABS: 1.0000 | ORAL_TABLET | Freq: Two times a day (BID) | ORAL | 11 refills | Status: AC

## 2023-09-28 MED ORDER — EPLERENONE 25 MG PO TABS: 25.0000 mg | ORAL_TABLET | Freq: Every day | ORAL | 11 refills | Status: DC

## 2023-09-28 MED ORDER — LOKELMA 10 G PO PACK
10.0000 g | PACK | Freq: Every day | ORAL | 3 refills | Status: AC
Start: 2023-09-28 — End: ?

## 2023-09-28 MED ORDER — ATORVASTATIN CALCIUM 40 MG PO TABS: 40.0000 mg | ORAL_TABLET | Freq: Every day | ORAL | 3 refills | Status: AC

## 2023-09-28 MED ORDER — FUROSEMIDE 40 MG PO TABS: 40.0000 mg | ORAL_TABLET | ORAL | 3 refills | Status: DC | PRN

## 2023-09-28 MED ORDER — RIVAROXABAN 20 MG PO TABS: ORAL_TABLET | ORAL | 11 refills | Status: AC

## 2023-09-28 MED ORDER — CARVEDILOL 12.5 MG PO TABS: 12.5000 mg | ORAL_TABLET | Freq: Two times a day (BID) | ORAL | 3 refills | Status: AC

## 2023-09-28 NOTE — Telephone Encounter (Signed)
 Walk in form  Pt/pts sister request for all RX be sent to [facility] as he was discharged from SNF recently  All RX sent

## 2023-09-28 NOTE — Telephone Encounter (Signed)
 Results notes open regarding this, will close this encounter.

## 2023-09-29 ENCOUNTER — Other Ambulatory Visit: Payer: Self-pay | Admitting: Family Medicine

## 2023-10-03 ENCOUNTER — Ambulatory Visit (INDEPENDENT_AMBULATORY_CARE_PROVIDER_SITE_OTHER): Admitting: Orthopedic Surgery

## 2023-10-03 ENCOUNTER — Other Ambulatory Visit: Payer: Self-pay | Admitting: Family Medicine

## 2023-10-03 DIAGNOSIS — I87331 Chronic venous hypertension (idiopathic) with ulcer and inflammation of right lower extremity: Secondary | ICD-10-CM | POA: Diagnosis not present

## 2023-10-03 DIAGNOSIS — S82891A Other fracture of right lower leg, initial encounter for closed fracture: Secondary | ICD-10-CM | POA: Diagnosis not present

## 2023-10-04 ENCOUNTER — Telehealth: Payer: Self-pay

## 2023-10-04 DIAGNOSIS — E1069 Type 1 diabetes mellitus with other specified complication: Secondary | ICD-10-CM

## 2023-10-04 MED ORDER — TRESIBA FLEXTOUCH 100 UNIT/ML ~~LOC~~ SOPN: 25.0000 [IU] | PEN_INJECTOR | Freq: Every day | SUBCUTANEOUS | 1 refills | Status: AC

## 2023-10-04 MED ORDER — INSULIN DEGLUDEC 100 UNIT/ML ~~LOC~~ SOLN
25.0000 [IU] | Freq: Every day | SUBCUTANEOUS | 3 refills | Status: DC
Start: 2023-10-04 — End: 2023-10-04

## 2023-10-04 MED ORDER — NOVOLOG FLEXPEN 100 UNIT/ML ~~LOC~~ SOPN: 20.0000 [IU] | PEN_INJECTOR | Freq: Three times a day (TID) | SUBCUTANEOUS | 2 refills | Status: AC

## 2023-10-04 NOTE — Telephone Encounter (Signed)
 Sent Tresiba  in pen form

## 2023-10-04 NOTE — Telephone Encounter (Signed)
 Sent over both types of insulin  for him and let them know that the pen needles should be 4 times a day, give 51-month supply.

## 2023-10-04 NOTE — Telephone Encounter (Signed)
 They need the tresiba  sent over in pen injector form, not the vial

## 2023-10-04 NOTE — Addendum Note (Signed)
 Addended by: Jolyne Needs on: 10/04/2023 03:30 PM   Modules accepted: Orders

## 2023-10-04 NOTE — Telephone Encounter (Signed)
 Copied from CRM [id number]. Topic: Clinical - Medication Question >> Oct 04, 2023 12:14 PM Yolanda T wrote: Reason for CRM: Heidi Llamas with [facility] called stated they recvd a script for Insulin  Pen Needle (SURE COMFORT PEN NEEDLES) 32G X 4 MM but she needs to know the number of injections per day. She also said she needs the script for Insulin  Degludec (TRESIBA  Grand) and Novolog . Please f/u with Heidi Llamas at [phone]

## 2023-10-04 NOTE — Addendum Note (Signed)
 Addended by: Jolyne Needs on: 10/04/2023 02:00 PM   Modules accepted: Orders

## 2023-10-05 ENCOUNTER — Other Ambulatory Visit (HOSPITAL_COMMUNITY): Payer: Self-pay

## 2023-10-05 ENCOUNTER — Telehealth: Payer: Self-pay

## 2023-10-05 NOTE — Telephone Encounter (Signed)
 Pharmacy Patient Advocate Encounter   Received notification from Onbase that prior authorization for [facility] 3 plus sensor is required/requested.   Insurance verification completed.   The patient is insured through [employer] .   Per test claim: patient has plan benefit exclusion. Product not covered.

## 2023-10-10 ENCOUNTER — Encounter: Payer: Self-pay | Admitting: Orthopedic Surgery

## 2023-10-10 NOTE — Progress Notes (Signed)
 Office Visit Note   Patient: Greg Robertson           Date of Birth: [birth year]           MRN: [id number] Visit Date: 10/03/2023              Requested by: Dettinger, Fonda LABOR, MD [address] [place],  [place] [address] PCP: Dettinger, Fonda LABOR, MD  Chief Complaint  Patient presents with   Right Leg - Follow-up      HPI: Patient is a 73 year old gentleman who is seen in follow-up for chronic venous ulceration.  Patient has developed dermatitis in his leg from using a nonstick gauze.  Patient has increased swelling in the foot he has been wrapping the calf.  Assessment & Plan: Visit Diagnoses:  1. Closed fracture dislocation of right ankle, initial encounter   2. Chronic venous hypertension (idiopathic) with ulcer and inflammation of right lower extremity (HCC)     Plan: Recommended using regular gauze and wrapping from the metatarsal heads to the tibial tubercle as well as elevation and exercise.  Follow-Up Instructions: Return in about 4 weeks (around 10/31/2023).   Ortho Exam  Patient is alert, oriented, no adenopathy, well-dressed, normal affect, normal respiratory effort. Patient patient has increased dermatitis from the venous swelling edema.  There is increased swelling of the foot and proximal calf.    Imaging: No results found.    Labs: Lab Results  Component Value Date   HGBA1C 9.0 (H) 09/20/2023   HGBA1C 9.2 (H) 12/08/2022   HGBA1C 9.9 (H) 10/27/2022   REPTSTATUS 03/11/2012 FINAL 03/10/2012   CULT NO GROWTH 03/10/2012     Lab Results  Component Value Date   ALBUMIN  4.1 09/20/2023   ALBUMIN  3.1 (L) 12/08/2022   ALBUMIN  4.1 04/20/2022    Lab Results  Component Value Date   MG 1.9 03/13/2012   MG 2.2 03/12/2012   MG 2.1 03/10/2012   Lab Results  Component Value Date   VD25OH 10.7 (L) 05/24/2018   VD25OH 14.1 (L) 01/17/2018   VD25OH 14.7 (L) 09/13/2017    No results found for: PREALBUMIN    Latest Ref Rng & Units 09/20/2023   11:04  AM 05/23/2023   10:17 AM 02/27/2023   12:35 PM  CBC EXTENDED  WBC 3.4 - 10.8 x10E3/uL 7.7  7.7  7.4   RBC 4.14 - 5.80 x10E6/uL 4.27  4.53  4.27   Hemoglobin 13.0 - 17.7 g/dL 87.5  87.0  87.7   HCT 37.5 - 51.0 % 39.0  39.0  37.1   Platelets 150 - 450 x10E3/uL 159  173  159   NEUT# 1.4 - 7.0 x10E3/uL 4.8     Lymph# 0.7 - 3.1 x10E3/uL 1.9        There is no height or weight on file to calculate BMI.  Orders:  Orders Placed This Encounter  Procedures   Ambulatory referral to Physical Therapy   No orders of the defined types were placed in this encounter.    Procedures: No procedures performed  Clinical Data: No additional findings.  ROS:  All other systems negative, except as noted in the HPI. Review of Systems  Objective: Vital Signs: There were no vitals taken for this visit.  Specialty Comments:  No specialty comments available.  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   Mixed hyperlipidemia 08/22/2022   Chronic insomnia 11/24/2021   Retinopathy 11/24/2021   Central retinal vein occlusion, right eye, stable 10/25/2021   History of CVA (cerebrovascular accident) 09/27/2020   A-fib (HCC) 09/27/2020   Left homonymous hemianopsia 09/27/2020   COPD (chronic obstructive pulmonary disease) (HCC) 09/05/2017   Aortic atherosclerosis (HCC) 09/04/2017   Benign prostatic hyperplasia 09/04/2017   Hypertension, essential 03/14/2012   Chronic CHF (congestive heart failure) (HCC) 03/12/2012   Lynch syndrome 07/28/2011   History of colon cancer 05/25/2011   DM type 1 (diabetes mellitus, type 1) (HCC) 05/10/2011   Glaucoma 05/10/2011   Past Medical History:  Diagnosis Date   Allergic rhinitis    Anemia    Atrial fibrillation (HCC)    Persistent    Cancer of ascending colon, 7cm 05/25/2011   Congestive heart failure (CHF) (HCC)    Diabetes mellitus type I (HCC)    Glaucoma    Hypertension    Pneumonia 1979 or 1980   Prostate nodule    Substance abuse (HCC)     Family History  Problem Relation Age of Onset   Breast cancer Mother    Pancreatitis Mother        intestinal adhesions   Insomnia Mother    Colon polyps Father    Lung cancer Father    Diabetes Father    Prostate cancer Father    Colon cancer Neg Hx    Rectal cancer Neg Hx    Esophageal cancer Neg Hx    Inflammatory bowel disease Neg Hx    Liver disease Neg Hx    Pancreatic cancer Neg Hx    Stomach cancer Neg Hx     Past Surgical History:  Procedure Laterality Date   broken left shoulder blade and collar bone     COLONOSCOPY WITH PROPOFOL  N/A 04/29/2021   Procedure: COLONOSCOPY WITH PROPOFOL ;  Surgeon: Teressa Toribio SQUIBB, MD;  Location: [facility];  Service: Endoscopy;  Laterality: N/A;   ESOPHAGOGASTRODUODENOSCOPY (EGD) WITH PROPOFOL  N/A 04/29/2021   Procedure: ESOPHAGOGASTRODUODENOSCOPY (EGD) WITH PROPOFOL ;  Surgeon: Teressa Toribio SQUIBB, MD;  Location: [facility];  Service: Endoscopy;  Laterality: N/A;   FINGER SURGERY  2009   right middle   HEMOSTASIS CLIP PLACEMENT  04/29/2021   Procedure: HEMOSTASIS CLIP PLACEMENT;  Surgeon: Teressa Toribio SQUIBB, MD;  Location: [facility];  Service: Endoscopy;;   LAPAROSCOPIC ASSISTED 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     NASAL FRACTURE SURGERY  1968   ORIF ANKLE FRACTURE Right 12/08/2022   Procedure: OPEN REDUCTION INTERNAL FIXATION (ORIF) ANKLE FRACTURE;  Surgeon: Georgina Ozell LABOR, MD;  Location: [facility] OR;  Service: Orthopedics;  Laterality: Right;   POLYPECTOMY  04/29/2021   Procedure: POLYPECTOMY;  Surgeon: Teressa Toribio SQUIBB, MD;  Location: [facility];  Service: Endoscopy;;   PORTACATH PLACEMENT  07/01/2011   Procedure: INSERTION PORT-A-CATH;  Surgeon: Elspeth KYM Schultze, MD;  Location: [facility] ORS;  Service: General;   Laterality: Left;  Insertion of Port-A-Catheter Left Internal Jugular   RIGHT/LEFT HEART CATH AND CORONARY ANGIOGRAPHY N/A 12/08/2020   Procedure: RIGHT/LEFT HEART CATH AND CORONARY ANGIOGRAPHY;  Surgeon: Rolan Ezra RAMAN, MD;  Location: [facility] INVASIVE CV LAB;  Service: Cardiovascular;  Laterality: N/A;   TONSILLECTOMY  1957 - approximate   Social History   Occupational History   Occupation: Retired    Comment: Airline pilot  Tobacco Use   Smoking status: Former    Current packs/day:  0.00    Average packs/day: 1 pack/day for 15.0 years (15.0 ttl pk-yrs)    Types: Cigarettes    Start date: 04/18/1978    Quit date: 04/18/1993    Years since quitting: 30.4   Smokeless tobacco: Never   Tobacco comments:    marijuana every night   Vaping Use   Vaping status: Never Used  Substance and Sexual Activity   Alcohol use: Not Currently    Alcohol/week: 5.0 standard drinks of alcohol    Types: 3 Cans of beer, 2 Shots of liquor per week    Comment: 1 drink every 2 days   Drug use: Not Currently    Types: Marijuana    Comment: once a night marijuana   Sexual activity: Not Currently

## 2023-10-12 ENCOUNTER — Encounter: Payer: Self-pay | Admitting: Nurse Practitioner

## 2023-10-12 ENCOUNTER — Inpatient Hospital Stay: Payer: Medicare Other

## 2023-10-12 ENCOUNTER — Inpatient Hospital Stay: Payer: Medicare Other | Attending: Nurse Practitioner | Admitting: Nurse Practitioner

## 2023-10-12 ENCOUNTER — Inpatient Hospital Stay: Payer: Self-pay

## 2023-10-12 VITALS — BP 128/61 | HR 79 | Temp 98.1°F | Resp 18 | Ht 74.0 in | Wt 200.7 lb

## 2023-10-12 DIAGNOSIS — Z9221 Personal history of antineoplastic chemotherapy: Secondary | ICD-10-CM | POA: Diagnosis not present

## 2023-10-12 DIAGNOSIS — Z1509 Genetic susceptibility to other malignant neoplasm: Secondary | ICD-10-CM | POA: Diagnosis not present

## 2023-10-12 DIAGNOSIS — Z85038 Personal history of other malignant neoplasm of large intestine: Secondary | ICD-10-CM | POA: Insufficient documentation

## 2023-10-12 DIAGNOSIS — R970 Elevated carcinoembryonic antigen [CEA]: Secondary | ICD-10-CM | POA: Diagnosis not present

## 2023-10-12 DIAGNOSIS — C182 Malignant neoplasm of ascending colon: Secondary | ICD-10-CM

## 2023-10-12 LAB — CEA (ACCESS): CEA (CHCC): 3.48 ng/mL (ref 0.00–5.00)

## 2023-10-12 NOTE — Progress Notes (Signed)
  [facility] OFFICE PROGRESS NOTE   Diagnosis: Colon cancer, hereditary nonpolyposis colon cancer syndrome  INTERVAL HISTORY:   Greg Robertson returns as scheduled.  No change in bowel habits.  No bleeding with bowel movements.  He denies abdominal pain.  Appetite is good.  He reports a right ankle fracture last year caused him to miss his scheduled appointment for EGD and colonoscopy.  Objective:  Vital signs in last 24 hours:  Blood pressure 128/61, pulse 79, temperature 98.1 F (36.7 C), temperature source Temporal, resp. rate 18, height 6' 2 (1.88 m), weight 200 lb 11.2 oz (91 kg), SpO2 98%.    Lymphatics: No palpable cervical, supraclavicular, axillary or inguinal lymph nodes. Resp: Lungs clear bilaterally. Cardio: Regular rate and rhythm. GI: No hepatosplenomegaly.  No mass. Vascular: Right lower leg is wrapped.  No edema left leg.     Lab Results:  Lab Results  Component Value Date   WBC 7.7 09/20/2023   HGB 12.4 (L) 09/20/2023   HCT 39.0 09/20/2023   MCV 91 09/20/2023   PLT 159 09/20/2023   NEUTROABS 4.8 09/20/2023    Imaging:  No results found.  Medications: I have reviewed the patient's current medications.  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 Tumor microsatellite unstable, K-ras wild type. Adjuvant FOLFOX chemotherapy initiated on 07/14/2011. Cycle 12 given on 01/05/2012.   -Negative surveillance colonoscopy 06/06/2012   -Negative surveillance CT scans 04/25/2013 -Negative surveillance CT scans 07/21/2014 -Negative surveillance colonoscopy 09/10/2014 -Negative surveillance colonoscopy 01/16/2018 -Negative surveillance colonoscopy 04/29/2021 Microcytic anemia. Likely secondary to iron deficiency. Resolved. Insulin -dependent diabetes. Glaucoma. 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 He has seen a Runner, broadcasting/film/video. He has been contacted with updated information regarding screening recommendations. Status post Port-A-Cath placement 07/01/2011. The Port-A-Cath has been removed. History of thrombocytopenia secondary to chemotherapy. Oxaliplatin  was held with cycle 3. Oxaliplatin  was resumed with cycle 4 at a 25% dose reduction. Oxaliplatin  was further dose reduced beginning with cycle 6 due to cytopenias. The oxaliplatin  was held with cycle 8, cycle 10 and cycle 11. History of neutropenia secondary to chemotherapy. Chronic mild elevation of the CEA Upper endoscopy 01/16/2018-normal esophagus;  mild gastritis; 1 duodenal polyp (tubular adenoma); distal stomach biopsy with antral and corpus mucosa with slight chronic inflammation, negative for H. Pylori,  no intestinal metaplasia dysplasia or malignancy.  Upper endoscopy 04/29/2021-single 13 mm sessile polyp third portion of the duodenum (tubular adenoma, negative for high-grade dysplasia) Right PCA distribution CVA June 2022 CHF History of atrial fibrillation Right ankle fracture 12/07/2022 status post surgery      Disposition: Greg Robertson remains in clinical remission from colon cancer.  We will follow-up on the CEA from today.  He appears to be overdue for EGD and colonoscopy surveillance.  Referral placed to gastroenterology.  He will submit a urine for cytology today.  He would like to continue follow[facility]up at the [facility]  He will return for a CEA and office visit in 1 year.    Greg Robertson ANP/GNP-BC   10/12/2023  1:46 PM

## 2023-10-13 ENCOUNTER — Telehealth: Payer: Self-pay

## 2023-10-13 NOTE — Telephone Encounter (Signed)
 Patient gave verbal understanding and had no further questions or concerns

## 2023-10-13 NOTE — Telephone Encounter (Signed)
-----   Message from Olam Ned sent at 10/12/2023  4:49 PM EDT ----- Please let him know CEA is normal.  Follow-up as scheduled.

## 2023-10-17 ENCOUNTER — Ambulatory Visit: Attending: Orthopedic Surgery

## 2023-10-17 DIAGNOSIS — M25671 Stiffness of right ankle, not elsewhere classified: Secondary | ICD-10-CM | POA: Insufficient documentation

## 2023-10-17 DIAGNOSIS — M6281 Muscle weakness (generalized): Secondary | ICD-10-CM | POA: Diagnosis present

## 2023-10-17 DIAGNOSIS — I87331 Chronic venous hypertension (idiopathic) with ulcer and inflammation of right lower extremity: Secondary | ICD-10-CM | POA: Diagnosis not present

## 2023-10-17 DIAGNOSIS — S82891A Other fracture of right lower leg, initial encounter for closed fracture: Secondary | ICD-10-CM | POA: Insufficient documentation

## 2023-10-17 NOTE — Therapy (Signed)
 OUTPATIENT PHYSICAL THERAPY LOWER EXTREMITY EVALUATION   Patient Name: Greg Robertson MRN: [id number] DOB:03/12/1951, 73 y.o., male Today's Date: 10/17/2023  END OF SESSION:  PT End of Session - 10/17/23 1108     Visit Number 1    Number of Visits 8    Date for PT Re-Evaluation 01/12/24    PT Start Time 1110    PT Stop Time 1147    PT Time Calculation (min) 37 min    Activity Tolerance Patient tolerated treatment well    Behavior During Therapy [facility] for tasks assessed/performed          Past Medical History:  Diagnosis Date   Allergic rhinitis    Anemia    Atrial fibrillation (HCC)    Persistent   Cancer of ascending colon, 7cm 05/25/2011   Congestive heart failure (CHF) (HCC)    Diabetes mellitus type I (HCC)    Glaucoma    Hypertension    Pneumonia 1979 or 1980   Prostate nodule    Substance abuse (HCC)    Past Surgical History:  Procedure Laterality Date   broken left shoulder blade and collar bone     COLONOSCOPY WITH PROPOFOL  N/A 04/29/2021   Procedure: COLONOSCOPY WITH PROPOFOL ;  Surgeon: Teressa Toribio SQUIBB, MD;  Location: [facility];  Service: Endoscopy;  Laterality: N/A;   ESOPHAGOGASTRODUODENOSCOPY (EGD) WITH PROPOFOL  N/A 04/29/2021   Procedure: ESOPHAGOGASTRODUODENOSCOPY (EGD) WITH PROPOFOL ;  Surgeon: Teressa Toribio SQUIBB, MD;  Location: [facility];  Service: Endoscopy;  Laterality: N/A;   FINGER SURGERY  2009   right middle   HEMOSTASIS CLIP PLACEMENT  04/29/2021   Procedure: HEMOSTASIS CLIP PLACEMENT;  Surgeon: Teressa Toribio SQUIBB, MD;  Location: [facility];  Service: Endoscopy;;   LAPAROSCOPIC ASSISTED 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     NASAL FRACTURE SURGERY  1968   ORIF ANKLE FRACTURE Right 12/08/2022   Procedure: OPEN REDUCTION INTERNAL FIXATION (ORIF) ANKLE FRACTURE;  Surgeon: Georgina Ozell LABOR, MD;  Location: [facility];  Service: Orthopedics;  Laterality: Right;   POLYPECTOMY  04/29/2021   Procedure: POLYPECTOMY;  Surgeon: Teressa Toribio SQUIBB,  MD;  Location: [facility];  Service: Endoscopy;;   PORTACATH PLACEMENT  07/01/2011   Procedure: INSERTION PORT-A-CATH;  Surgeon: Elspeth KYM Schultze, MD;  Location: [facility];  Service: General;  Laterality: Left;  Insertion of Port-A-Catheter Left Internal Jugular   RIGHT/LEFT HEART CATH AND CORONARY ANGIOGRAPHY N/A 12/08/2020   Procedure: RIGHT/LEFT HEART CATH AND CORONARY ANGIOGRAPHY;  Surgeon: Rolan Ezra RAMAN, MD;  Location: [facility];  Service: Cardiovascular;  Laterality: N/A;   TONSILLECTOMY  1957 - approximate   Patient Active Problem List   Diagnosis Date Noted   Closed fracture dislocation of right ankle joint 12/08/2022   Change in bowel habits 10/29/2022   Chronic anticoagulation 10/29/2022   History of colectomy 10/29/2022   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   Mixed hyperlipidemia 08/22/2022   Chronic insomnia 11/24/2021   Retinopathy 11/24/2021   Central retinal vein occlusion, right eye, stable 10/25/2021   History of CVA (cerebrovascular accident) 09/27/2020   A-fib (HCC) 09/27/2020   Left homonymous hemianopsia 09/27/2020   COPD (chronic obstructive pulmonary disease) (HCC) 09/05/2017   Aortic atherosclerosis (HCC) 09/04/2017   Benign prostatic hyperplasia 09/04/2017   Hypertension, essential 03/14/2012   Chronic CHF (congestive heart failure) (HCC) 03/12/2012   Lynch syndrome 07/28/2011   History of colon cancer 05/25/2011   DM type 1 (diabetes mellitus, type 1) (HCC) 05/10/2011   Glaucoma 05/10/2011   REFERRING  PROVIDER: Harden Jerona GAILS, MD   REFERRING DIAG: Closed fracture dislocation of right ankle, initial encounter, Chronic venous hypertension (idiopathic) with ulcer and inflammation of right lower extremity   THERAPY DIAG:  Stiffness of right ankle, not elsewhere classified  Muscle weakness (generalized)  Rationale for Evaluation and Treatment: Rehabilitation  ONSET DATE: August 2024  SUBJECTIVE:   SUBJECTIVE STATEMENT: Patient reports that he lost  his balance and broke his right ankle last August after his blood sugar low. He keeps his lower leg wrapped unless he is taking a bath or taking care of his ulcer. However, he notes that it is almost completely healed. He had a lot of trouble getting around initially, but it has gotten better. However, he can tell that it is not strong and his balance is off right now.  He notes that while he was in an assisted living facility that he did not get the adequate physical therapy that he needed. He was told that unless he is walking his physician wants his legs elevated to help with his swelling.   PERTINENT HISTORY: Atrial fibrillation, congestive heart failure, hypertension, COPD, diabetes type 1, history of cancer, history of a CVA, and glaucoma PAIN:  Are you having pain? No  PRECAUTIONS: None  RED FLAGS: None   WEIGHT BEARING RESTRICTIONS: No  FALLS:  Has patient fallen in last 6 months? No  LIVING ENVIRONMENT: Lives with: lives alone, but has an aide that comes in twice per week Lives in: House/apartment Stairs: Yes: Internal: 14 steps; on right going up and External: 3 steps; none Has following equipment at home: None  OCCUPATION: retired  PLOF: Independent with basic ADLs  PATIENT GOALS: improved balance and strength  NEXT MD VISIT: 11/02/23  OBJECTIVE:  Note: Objective measures were completed at Evaluation unless otherwise noted.  DIAGNOSTIC FINDINGS: 03/13/23 right ankle x-ray Three-view radiographs of the right ankle shows stable internal fixation of the fibular fracture medial malleolus fracture and syndesmosis disruption.  Patient has calcification of the anterior tibial and dorsalis pedis artery.  PATIENT SURVEYS:  LEFS  Extreme difficulty/unable (0), Quite a bit of difficulty (1), Moderate difficulty (2), Little difficulty (3), No difficulty (4) Survey date:  10/17/23  Any of your usual work, housework or school activities 3  2. Usual hobbies, recreational or sporting  activities 3  3. Getting into/out of the bath 4  4. Walking between rooms 4  5. Putting on socks/shoes 4  6. Squatting  3  7. Lifting an object, like a bag of groceries from the floor 3  8. Performing light activities around your home 3  9. Performing heavy activities around your home 2  10. Getting into/out of a car 3  11. Walking 2 blocks 3  12. Walking 1 mile 3  13. Going up/down 10 stairs (1 flight) 3  14. Standing for 1 hour 3  15.  sitting for 1 hour 4  16. Running on even ground 1  17. Running on uneven ground 1  18. Making sharp turns while running fast 1  19. Hopping  1  20. Rolling over in bed 4  Score total:  56/80     COGNITION: Overall cognitive status: Within functional limits for tasks assessed     SENSATION: Patient reports no numbness or tingling  LOWER EXTREMITY ROM:  Active ROM Right eval Left eval  Hip flexion    Hip extension    Hip abduction    Hip adduction    Hip internal rotation  Hip external rotation    Knee flexion    Knee extension    Ankle dorsiflexion -3 5  Ankle plantarflexion 27 29  Ankle inversion 12 23  Ankle eversion 7 16   (Blank rows = not tested)  LOWER EXTREMITY MMT:  MMT Right eval Left eval  Hip flexion 4-/5 4/5  Hip extension    Hip abduction    Hip adduction    Hip internal rotation    Hip external rotation    Knee flexion 5/5 5/5  Knee extension 4+/5 4+/5  Ankle dorsiflexion 4/5 5/5  Ankle plantarflexion    Ankle inversion 4/5 5/5  Ankle eversion 4/5 5/5   (Blank rows = not tested)  GAIT: Assistive device utilized: None Level of assistance: Complete Independence                                                                                                            TREATMENT DATE:     PATIENT EDUCATION:  Education details: Plan of care, prognosis, healing, swelling, objective findings, and goals for physical therapy Person educated: Patient Education method: Explanation Education  comprehension: verbalized understanding  HOME EXERCISE PROGRAM:   ASSESSMENT:  CLINICAL IMPRESSION: Patient is a 73 y.o. male who was seen today for physical therapy evaluation and treatment for right ankle stiffness and weakness secondary to a right ankle fracture with surgical repair in August 2024.  He presented with reduced right ankle active range of motion and muscular strength compared to the left lower extremity.  Recommend that he continue with skilled physical therapy to address his impairments to return to his prior level of function.  OBJECTIVE IMPAIRMENTS: decreased activity tolerance, decreased balance, decreased mobility, difficulty walking, decreased ROM, decreased strength, and hypomobility.   ACTIVITY LIMITATIONS: locomotion level  PARTICIPATION LIMITATIONS: community activity  PERSONAL FACTORS: Past/current experiences, Time since onset of injury/illness/exacerbation, Transportation, and 3+ comorbidities: Atrial fibrillation, congestive heart failure, hypertension, COPD, diabetes type 1, history of cancer, history of a CVA, and glaucoma are also affecting patient's functional outcome.   REHAB POTENTIAL: Good  CLINICAL DECISION MAKING: Evolving/moderate complexity  EVALUATION COMPLEXITY: Moderate   GOALS: Goals reviewed with patient? Yes  LONG TERM GOALS: Target date: 11/14/23  Patient will be independent with his HEP. Baseline:  Goal status: INITIAL  2.  Patient will improve his right ankle dorsiflexion by at least 6 degrees for improved gait mechanics. Baseline:  Goal status: INITIAL  3.  Patient will report being able to walk at least half a mile to return to his normal community activities. Baseline:  Goal status: INITIAL  4.  Patient will improve his LEFS score to at least 67/80 for improved perceived function with his daily activities. Baseline:  Goal status: INITIAL   PLAN:  PT FREQUENCY: 2x/week  PT DURATION: 4 weeks  PLANNED  INTERVENTIONS: 97164- PT Re-evaluation, 97750- Physical Performance Testing, 97110-Therapeutic exercises, 97530- Therapeutic activity, [id number]- Neuromuscular re-education, 97535- Self Care, 02859- Manual therapy, [phone]- Gait training, [phone]- Electrical stimulation (unattended), [phone]- Vasopneumatic device, Patient/Family education, Balance training, Stair training,  Joint mobilization, Cryotherapy, and Moist heat  PLAN FOR NEXT SESSION: NuStep, manual therapy, lower extremity strengthening, and modalities as needed   Lacinda JAYSON Fass, PT 10/17/2023, 4:49 PM

## 2023-10-19 ENCOUNTER — Other Ambulatory Visit (INDEPENDENT_AMBULATORY_CARE_PROVIDER_SITE_OTHER): Admitting: Pharmacist

## 2023-10-19 DIAGNOSIS — E1059 Type 1 diabetes mellitus with other circulatory complications: Secondary | ICD-10-CM

## 2023-10-19 NOTE — Progress Notes (Signed)
 10/19/2023 Name: Greg Robertson MRN: [id number] DOB: 02/21/1951  Chief Complaint  Patient presents with   Diabetes    Greg Robertson is a 73 y.o. year old male who presented for a telephone visit.  I connected with  Greg Robertson on 10/19/23 by telephone and verified that I am speaking with the correct person using two identifiers. I discussed the limitations of evaluation and management by telemedicine. The patient expressed understanding and agreed to proceed.  Patient was located in her home and PharmD in PCP office during this visit.   They were referred to the pharmacist by their PCP for assistance in managing diabetes and medication access.    Subjective:  Patient reports he is very brittle to insulins.  He reports he has been on insulin  for 47 years.  He reports ongoing with issues with hypoglycemia.  He reports no patterns with hypoglycemia.  He is interested in CGM, but is having insurance issues.  Care Team: Primary Care Provider: Dettinger, Fonda LABOR, MD next appt: 12/21/2023   Medication Access/Adherence  Current Pharmacy:  [facility] - [place], [place] - [address] [place] [address] Phone: [phone] Fax: [phone]  [facility] [place], [place] - [address] [place] [address] Phone: [phone] Fax: [phone]   Patient reports affordability concerns with their medications: No  Patient reports access/transportation concerns to their pharmacy: No  Patient reports adherence concerns with their medications:  No    Diabetes:  Current medications: Novolog  20-30 units 3 times daily--taking 1 units of Novolog  per 6 g of carbs, Tresiba  22 units daily Medications tried in the past: various different brand name insulins Current glucose readings: 150-300s Using traditional glucometer meter CGM is a plan exclusion (unable to obtain) Reports libre 2 was not accurate in the past  Patient  reports hypoglycemic s/sx including dizziness, shakiness, sweating. Carries grape juice and cookies with him. Patient denies hyperglycemic symptoms including polyuria, polydipsia, polyphagia, nocturia, neuropathy, blurred vision.  Current meal patterns:  Reports 3 meals day; doesn't miss a meal - Breakfast: cereal 36g (1.5cups), apple juice 8oz 26g, milk 10g (took 11 units of novolog ) For every 50 pts over 100 --adds 1 additional unit Gave 13 units of novolog  this AM --hasn't checked sugar again today  Current physical activity: limited due to recent broken ankle  Current medication access support: [employer] medicare  Objective:  Lab Results  Component Value Date   HGBA1C 9.0 (H) 09/20/2023    Lab Results  Component Value Date   CREATININE 2.06 (H) 09/20/2023   BUN 56 (H) 09/20/2023   NA 138 09/20/2023   K 4.6 09/20/2023   CL 104 09/20/2023   CO2 18 (L) 09/20/2023    Lab Results  Component Value Date   CHOL 131 09/20/2023   HDL 48 09/20/2023   LDLCALC 65 09/20/2023   TRIG 96 09/20/2023   CHOLHDL 2.7 09/20/2023    Medications Reviewed Today     Reviewed by Billee Mliss JONETTA, [facility] (Pharmacist) on 10/19/23 at 1114  Med List Status: <None>   Medication Order Taking? Sig Documenting Provider Last Dose Status Informant  Amino Acids-Protein Hydrolys (PRO-STAT) LIQD [id number]  Take 8 mLs by mouth daily. [provider]  Active   atorvastatin  (LIPITOR) 40 MG tablet [id number]  Take 1 tablet (40 mg total) by mouth daily. Rolan Ezra RAMAN, MD  Active   carvedilol  (COREG ) 12.5 MG tablet [id number]  Take 1 tablet (12.5  mg total) by mouth 2 (two) times daily with a meal. Dose change-updated dispill Rolan Ezra RAMAN, MD  Active   Cholecalciferol  50 MCG (2000 UT) TABS [id number]  Take 2 tablets by mouth daily. [provider]  Active Self, Pharmacy Records  Continuous Glucose Receiver (FREESTYLE LIBRE 3 READER) DEVI [id number]  1 each by Does not apply route 4 (four) times daily.   Patient not taking: Reported on 10/12/2023   Dettinger, Fonda LABOR, MD  Consider Medication Status and Discontinue (Change in therapy)   Continuous Glucose Sensor (FREESTYLE LIBRE 3 PLUS SENSOR) MISC [id number]  Change sensor every 15 days.  Patient not taking: Reported on 10/12/2023   Dettinger, Fonda LABOR, MD  Consider Medication Status and Discontinue (Change in therapy)   eplerenone  (INSPRA ) 25 MG tablet [id number]  Take 1 tablet (25 mg total) by mouth daily. Rolan Ezra RAMAN, MD  Active   furosemide  (LASIX ) 40 MG tablet [id number]  Take 1 tablet (40 mg total) by mouth as needed. Rolan Ezra RAMAN, MD  Active   GARLIC PO [id number]  Take 1 capsule by mouth daily. [provider]  Active Self, Pharmacy Records  glucose blood test strip [id number]  Use as instructed Ricky Fines, MD  Active Self, Pharmacy Records  insulin  aspart (NOVOLOG  FLEXPEN) 100 UNIT/ML FlexPen [id number]  Inject 20-30 Units into the skin 3 (three) times daily with meals. Dettinger, Fonda LABOR, MD  Active   insulin  degludec (TRESIBA  FLEXTOUCH) 100 UNIT/ML FlexTouch Pen [id number]  Inject 25 Units into the skin daily. Dettinger, Fonda LABOR, MD  Active   Insulin  Pen Needle (SURE COMFORT PEN NEEDLES) 32G X 4 MM MISC [id number]  UAD with insulin  DX E10.9 Dettinger, Fonda LABOR, MD  Active   Lancets MISC [id number]  1 Lancet by Does not apply route 3 (three) times daily. Dettinger, Fonda LABOR, MD  Active Self, Pharmacy Records  levocetirizine (XYZAL ) 5 MG tablet [id number]  TAKE ONE TABLET BY MOUTH ONCE DAILY. Dettinger, Fonda LABOR, MD  Active   Multiple Vitamin (MULTIVITAMIN WITH MINERALS) TABS tablet [id number]  Take 1 tablet by mouth daily. (0800) [provider]  Active Self, Pharmacy Records  rivaroxaban  (XARELTO ) 20 MG TABS tablet [id number]  TAKE ONE TABLET BY MOUTH EVERY EVENING WITH SUPPER Rolan Ezra RAMAN, MD  Active   ROCKLATAN  0.02-0.005 % SOLN [id number]  Apply 0.5 drops to eye at bedtime. [provider]  Active Self,  Pharmacy Records  sacubitril -valsartan  (ENTRESTO ) 24-26 MG [id number]  Take 1 tablet by mouth 2 (two) times daily. Rolan Ezra RAMAN, MD  Active   sodium zirconium cyclosilicate  (LOKELMA ) 10 g PACK packet [id number]  Take 10 g by mouth daily. Rolan Ezra RAMAN, MD  Active   triamcinolone  (NASACORT ) 55 MCG/ACT AERO nasal inhaler [id number]  INSTILL 1 SPRAY IN EACH NOSTRIL ONCE OR TWICE DAILY AS NEEDED. Georgina Nancyann ORN, MD  Active Self, Pharmacy Records           Med Note RANELLE, KEENE HERO   Fri Dec 17, 2021  2:06 PM)    CARMEL TO FIND [id number]  Med Name: Pro-stat sugar free 32oz  Give (80) ML by Mouth Twice a day [provider]  Active   vitamin C  (ASCORBIC ACID ) 500 MG tablet [id number]  Take 500 mg by mouth in the morning. (0800) [provider]  Active Self, Pharmacy Records              Assessment/Plan:   Diabetes: - Currently uncontrolled - Reviewed long term cardiovascular  and renal outcomes of uncontrolled blood sugar - Reviewed goal A1c, goal fasting, and goal 2 hour post prandial glucose - Recommend to :  Continue current regimen for now--need CGM to provide better information on patient's food/insulin  injections  Libre orders sent to [facility] company via parachute portal  Incorporate protein an fiber in to meals (this is a must to maintain stable blood sugar)  Ensure carb counting is accurate   - Recommend to check glucose daily (fasting) or if symptomatic -Patient does not wish to see endocrine - Sent libre 3 CGM PLUS orders to [facility] via parachute portal   Follow Up Plan: 4 weeks  Mliss Tarry Griffin, PharmD, BCACP, CPP Clinical Pharmacist, [facility]   30 min of patient care was provided to the patient during this visit time.  This is a no charge visit

## 2023-10-24 ENCOUNTER — Ambulatory Visit

## 2023-10-24 DIAGNOSIS — M25671 Stiffness of right ankle, not elsewhere classified: Secondary | ICD-10-CM | POA: Diagnosis not present

## 2023-10-24 DIAGNOSIS — M6281 Muscle weakness (generalized): Secondary | ICD-10-CM

## 2023-10-24 NOTE — Therapy (Signed)
 OUTPATIENT PHYSICAL THERAPY LOWER EXTREMITY TREATMENT   Patient Name: Greg Robertson MRN: [id number] DOB:04-04-1951, 73 y.o., male Today's Date: 10/24/2023  END OF SESSION:  PT End of Session - 10/24/23 1104     Visit Number 2    Number of Visits 8    Date for PT Re-Evaluation 01/12/24    PT Start Time 1100    PT Stop Time 1143    PT Time Calculation (min) 43 min    Activity Tolerance Patient tolerated treatment well    Behavior During Therapy [facility] for tasks assessed/performed           Past Medical History:  Diagnosis Date   Allergic rhinitis    Anemia    Atrial fibrillation (HCC)    Persistent   Cancer of ascending colon, 7cm 05/25/2011   Congestive heart failure (CHF) (HCC)    Diabetes mellitus type I (HCC)    Glaucoma    Hypertension    Pneumonia 1979 or 1980   Prostate nodule    Substance abuse (HCC)    Past Surgical History:  Procedure Laterality Date   broken left shoulder blade and collar bone     COLONOSCOPY WITH PROPOFOL  N/A 04/29/2021   Procedure: COLONOSCOPY WITH PROPOFOL ;  Surgeon: Teressa Toribio SQUIBB, MD;  Location: [facility];  Service: Endoscopy;  Laterality: N/A;   ESOPHAGOGASTRODUODENOSCOPY (EGD) WITH PROPOFOL  N/A 04/29/2021   Procedure: ESOPHAGOGASTRODUODENOSCOPY (EGD) WITH PROPOFOL ;  Surgeon: Teressa Toribio SQUIBB, MD;  Location: [facility];  Service: Endoscopy;  Laterality: N/A;   FINGER SURGERY  2009   right middle   HEMOSTASIS CLIP PLACEMENT  04/29/2021   Procedure: HEMOSTASIS CLIP PLACEMENT;  Surgeon: Teressa Toribio SQUIBB, MD;  Location: [facility];  Service: Endoscopy;;   LAPAROSCOPIC ASSISTED 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     NASAL FRACTURE SURGERY  1968   ORIF ANKLE FRACTURE Right 12/08/2022   Procedure: OPEN REDUCTION INTERNAL FIXATION (ORIF) ANKLE FRACTURE;  Surgeon: Georgina Ozell LABOR, MD;  Location: [facility];  Service: Orthopedics;  Laterality: Right;   POLYPECTOMY  04/29/2021   Procedure: POLYPECTOMY;  Surgeon: Teressa Toribio SQUIBB,  MD;  Location: [facility];  Service: Endoscopy;;   PORTACATH PLACEMENT  07/01/2011   Procedure: INSERTION PORT-A-CATH;  Surgeon: Elspeth KYM Schultze, MD;  Location: [facility];  Service: General;  Laterality: Left;  Insertion of Port-A-Catheter Left Internal Jugular   RIGHT/LEFT HEART CATH AND CORONARY ANGIOGRAPHY N/A 12/08/2020   Procedure: RIGHT/LEFT HEART CATH AND CORONARY ANGIOGRAPHY;  Surgeon: Rolan Ezra RAMAN, MD;  Location: [facility] INVASIVE CV LAB;  Service: Cardiovascular;  Laterality: N/A;   TONSILLECTOMY  1957 - approximate   Patient Active Problem List   Diagnosis Date Noted   Closed fracture dislocation of right ankle joint 12/08/2022   Change in bowel habits 10/29/2022   Chronic anticoagulation 10/29/2022   History of colectomy 10/29/2022   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   Mixed hyperlipidemia 08/22/2022   Chronic insomnia 11/24/2021   Retinopathy 11/24/2021   Central retinal vein occlusion, right eye, stable 10/25/2021   History of CVA (cerebrovascular accident) 09/27/2020   A-fib (HCC) 09/27/2020   Left homonymous hemianopsia 09/27/2020   COPD (chronic obstructive pulmonary disease) (HCC) 09/05/2017   Aortic atherosclerosis (HCC) 09/04/2017   Benign prostatic hyperplasia 09/04/2017   Hypertension, essential 03/14/2012   Chronic CHF (congestive heart failure) (HCC) 03/12/2012   Lynch syndrome 07/28/2011   History of colon cancer 05/25/2011   DM type 1 (diabetes mellitus, type 1) (HCC) 05/10/2011   Glaucoma 05/10/2011  REFERRING PROVIDER: Harden Jerona GAILS, MD   REFERRING DIAG: Closed fracture dislocation of right ankle, initial encounter, Chronic venous hypertension (idiopathic) with ulcer and inflammation of right lower extremity   THERAPY DIAG:  Stiffness of right ankle, not elsewhere classified  Muscle weakness (generalized)  Rationale for Evaluation and Treatment: Rehabilitation  ONSET DATE: August 2024  SUBJECTIVE:   SUBJECTIVE STATEMENT: Patient reports that he feels  good today.   PERTINENT HISTORY: Atrial fibrillation, congestive heart failure, hypertension, COPD, diabetes type 1, history of cancer, history of a CVA, and glaucoma PAIN:  Are you having pain? No  PRECAUTIONS: None  RED FLAGS: None   WEIGHT BEARING RESTRICTIONS: No  FALLS:  Has patient fallen in last 6 months? No  LIVING ENVIRONMENT: Lives with: lives alone, but has an aide that comes in twice per week Lives in: House/apartment Stairs: Yes: Internal: 14 steps; on right going up and External: 3 steps; none Has following equipment at home: None  OCCUPATION: retired  PLOF: Independent with basic ADLs  PATIENT GOALS: improved balance and strength  NEXT MD VISIT: 11/02/23  OBJECTIVE:  Note: Objective measures were completed at Evaluation unless otherwise noted.  DIAGNOSTIC FINDINGS: 03/13/23 right ankle x-ray Three-view radiographs of the right ankle shows stable internal fixation of the fibular fracture medial malleolus fracture and syndesmosis disruption.  Patient has calcification of the anterior tibial and dorsalis pedis artery.  PATIENT SURVEYS:  LEFS  Extreme difficulty/unable (0), Quite a bit of difficulty (1), Moderate difficulty (2), Little difficulty (3), No difficulty (4) Survey date:  10/17/23  Any of your usual work, housework or school activities 3  2. Usual hobbies, recreational or sporting activities 3  3. Getting into/out of the bath 4  4. Walking between rooms 4  5. Putting on socks/shoes 4  6. Squatting  3  7. Lifting an object, like a bag of groceries from the floor 3  8. Performing light activities around your home 3  9. Performing heavy activities around your home 2  10. Getting into/out of a car 3  11. Walking 2 blocks 3  12. Walking 1 mile 3  13. Going up/down 10 stairs (1 flight) 3  14. Standing for 1 hour 3  15.  sitting for 1 hour 4  16. Running on even ground 1  17. Running on uneven ground 1  18. Making sharp turns while running fast 1   19. Hopping  1  20. Rolling over in bed 4  Score total:  56/80     COGNITION: Overall cognitive status: Within functional limits for tasks assessed     SENSATION: Patient reports no numbness or tingling  LOWER EXTREMITY ROM:  Active ROM Right eval Left eval  Hip flexion    Hip extension    Hip abduction    Hip adduction    Hip internal rotation    Hip external rotation    Knee flexion    Knee extension    Ankle dorsiflexion -3 5  Ankle plantarflexion 27 29  Ankle inversion 12 23  Ankle eversion 7 16   (Blank rows = not tested)  LOWER EXTREMITY MMT:  MMT Right eval Left eval  Hip flexion 4-/5 4/5  Hip extension    Hip abduction    Hip adduction    Hip internal rotation    Hip external rotation    Knee flexion 5/5 5/5  Knee extension 4+/5 4+/5  Ankle dorsiflexion 4/5 5/5  Ankle plantarflexion    Ankle inversion 4/5  5/5  Ankle eversion 4/5 5/5   (Blank rows = not tested)  GAIT: Assistive device utilized: None Level of assistance: Complete Independence                                                                                                            TREATMENT DATE:                                     10/24/23 EXERCISE LOG  Exercise Repetitions and Resistance Comments  Nustep  L5 x 12 minutes   Rocker board  4.5 minutes    Standing gastroc stretch  2.5 minutes   Static stance on foam  3 minutes  Intermittent UE support from parallel bars   Marching on foam  2.5 minutes  BUE support from parallel bars   Ankle circles  2.5 minutes     Blank cell = exercise not performed today   PATIENT EDUCATION:  Education details: HEP (patient declined a Statistician) Person educated: Patient Education method: Explanation Education comprehension: verbalized understanding  HOME EXERCISE PROGRAM:   ASSESSMENT:  CLINICAL IMPRESSION: Patient was introduced to multiple new interventions for improved stability and mobility needed to complete his daily activities.  He required minimal cueing with today's new interventions for proper exercise performance. He required brief rest breaks throughout treatment due to fatigue with today's interventions. He reported feeling tired upon the conclusion of treatment. He continues to require skilled physical therapy to address his remaining impairments to return to his prior level of function.   OBJECTIVE IMPAIRMENTS: decreased activity tolerance, decreased balance, decreased mobility, difficulty walking, decreased ROM, decreased strength, and hypomobility.   ACTIVITY LIMITATIONS: locomotion level  PARTICIPATION LIMITATIONS: community activity  PERSONAL FACTORS: Past/current experiences, Time since onset of injury/illness/exacerbation, Transportation, and 3+ comorbidities: Atrial fibrillation, congestive heart failure, hypertension, COPD, diabetes type 1, history of cancer, history of a CVA, and glaucoma are also affecting patient's functional outcome.   REHAB POTENTIAL: Good  CLINICAL DECISION MAKING: Evolving/moderate complexity  EVALUATION COMPLEXITY: Moderate   GOALS: Goals reviewed with patient? Yes  LONG TERM GOALS: Target date: 11/14/23  Patient will be independent with his HEP. Baseline:  Goal status: INITIAL  2.  Patient will improve his right ankle dorsiflexion by at least 6 degrees for improved gait mechanics. Baseline:  Goal status: INITIAL  3.  Patient will report being able to walk at least half a mile to return to his normal community activities. Baseline:  Goal status: INITIAL  4.  Patient will improve his LEFS score to at least 67/80 for improved perceived function with his daily activities. Baseline:  Goal status: INITIAL   PLAN:  PT FREQUENCY: 2x/week  PT DURATION: 4 weeks  PLANNED INTERVENTIONS: 97164- PT Re-evaluation, 97750- Physical Performance Testing, 97110-Therapeutic exercises, 97530- Therapeutic activity, [id number]- Neuromuscular re-education, 97535- Self Care, [id number]-  Manual therapy, [phone]- Gait training, [phone]- Electrical stimulation (unattended), [phone]- Vasopneumatic device, Patient/Family education, Balance training, Stair training, Joint mobilization, Cryotherapy, and  Moist heat  PLAN FOR NEXT SESSION: NuStep, manual therapy, lower extremity strengthening, and modalities as needed   Lacinda JAYSON Fass, PT 10/24/2023, 1:04 PM

## 2023-10-27 ENCOUNTER — Ambulatory Visit

## 2023-10-27 DIAGNOSIS — M25671 Stiffness of right ankle, not elsewhere classified: Secondary | ICD-10-CM | POA: Diagnosis not present

## 2023-10-27 DIAGNOSIS — M6281 Muscle weakness (generalized): Secondary | ICD-10-CM

## 2023-10-27 NOTE — Therapy (Signed)
 OUTPATIENT PHYSICAL THERAPY LOWER EXTREMITY TREATMENT   Patient Name: Greg Robertson MRN: [id number] DOB:08/10/1950, 73 y.o., male Today's Date: 10/27/2023  END OF SESSION:  PT End of Session - 10/27/23 1109     Visit Number 3    Number of Visits 8    Date for PT Re-Evaluation 01/12/24    PT Start Time 1100    PT Stop Time 1141    PT Time Calculation (min) 41 min    Activity Tolerance Patient tolerated treatment well    Behavior During Therapy [facility] for tasks assessed/performed            Past Medical History:  Diagnosis Date   Allergic rhinitis    Anemia    Atrial fibrillation (HCC)    Persistent   Cancer of ascending colon, 7cm 05/25/2011   Congestive heart failure (CHF) (HCC)    Diabetes mellitus type I (HCC)    Glaucoma    Hypertension    Pneumonia 1979 or 1980   Prostate nodule    Substance abuse (HCC)    Past Surgical History:  Procedure Laterality Date   broken left shoulder blade and collar bone     COLONOSCOPY WITH PROPOFOL  N/A 04/29/2021   Procedure: COLONOSCOPY WITH PROPOFOL ;  Surgeon: Teressa Toribio SQUIBB, MD;  [facility];  Service: Endoscopy;  Laterality: N/A;   ESOPHAGOGASTRODUODENOSCOPY (EGD) WITH PROPOFOL  N/A 04/29/2021   Procedure: ESOPHAGOGASTRODUODENOSCOPY (EGD) WITH PROPOFOL ;  Surgeon: Teressa Toribio SQUIBB, MD;  [facility];  Service: Endoscopy;  Laterality: N/A;   FINGER SURGERY  2009   right middle   HEMOSTASIS CLIP PLACEMENT  04/29/2021   Procedure: HEMOSTASIS CLIP PLACEMENT;  Surgeon: Teressa Toribio SQUIBB, MD;  [facility];  Service: Endoscopy;;   LAPAROSCOPIC ASSISTED 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     NASAL FRACTURE SURGERY  1968   ORIF ANKLE FRACTURE Right 12/08/2022   Procedure: OPEN REDUCTION INTERNAL FIXATION (ORIF) ANKLE FRACTURE;  Surgeon: Georgina Ozell LABOR, MD;  Location: [facility];  Service: Orthopedics;  Laterality: Right;   POLYPECTOMY  04/29/2021   Procedure: POLYPECTOMY;  Surgeon: Teressa Toribio SQUIBB, MD;  [facility];  Service: Endoscopy;;   PORTACATH PLACEMENT  07/01/2011   Procedure: INSERTION PORT-A-CATH;  Surgeon: Elspeth KYM Schultze, MD;  Location: [facility];  Service: General;  Laterality: Left;  Insertion of Port-A-Catheter Left Internal Jugular   RIGHT/LEFT HEART CATH AND CORONARY ANGIOGRAPHY N/A 12/08/2020   Procedure: RIGHT/LEFT HEART CATH AND CORONARY ANGIOGRAPHY;  Surgeon: Rolan Ezra RAMAN, MD;  Location[facility] INVASIVE CV LAB;  Service: Cardiovascular;  Laterality: N/A;   TONSILLECTOMY  1957 - approximate   Patient Active Problem List   Diagnosis Date Noted   Closed fracture dislocation of right ankle joint 12/08/2022   Change in bowel habits 10/29/2022   Chronic anticoagulation 10/29/2022   History of colectomy 10/29/2022   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   Mixed hyperlipidemia 08/22/2022   Chronic insomnia 11/24/2021   Retinopathy 11/24/2021   Central retinal vein occlusion, right eye, stable 10/25/2021   History of CVA (cerebrovascular accident) 09/27/2020   A-fib (HCC) 09/27/2020   Left homonymous hemianopsia 09/27/2020   COPD (chronic obstructive pulmonary disease) (HCC) 09/05/2017   Aortic atherosclerosis (HCC) 09/04/2017   Benign prostatic hyperplasia 09/04/2017   Hypertension, essential 03/14/2012   Chronic CHF (congestive heart failure) (HCC) 03/12/2012   Lynch syndrome 07/28/2011   History of colon cancer 05/25/2011   DM type 1 (diabetes mellitus, type 1) (HCC) 05/10/2011   Glaucoma 05/10/2011  REFERRING PROVIDER: Harden Jerona GAILS, MD   REFERRING DIAG: Closed fracture dislocation of right ankle, initial encounter, Chronic venous hypertension (idiopathic) with ulcer and inflammation of right lower extremity   THERAPY DIAG:  Stiffness of right ankle, not elsewhere classified  Muscle weakness (generalized)  Rationale for Evaluation and Treatment: Rehabilitation  ONSET DATE: August 2024  SUBJECTIVE:   SUBJECTIVE STATEMENT: Patient reports that he is  a little stiff when he wakes up, but he feels good now.   PERTINENT HISTORY: Atrial fibrillation, congestive heart failure, hypertension, COPD, diabetes type 1, history of cancer, history of a CVA, and glaucoma PAIN:  Are you having pain? No  PRECAUTIONS: None  RED FLAGS: None   WEIGHT BEARING RESTRICTIONS: No  FALLS:  Has patient fallen in last 6 months? No  LIVING ENVIRONMENT: Lives with: lives alone, but has an aide that comes in twice per week Lives in: House/apartment Stairs: Yes: Internal: 14 steps; on right going up and External: 3 steps; none Has following equipment at home: None  OCCUPATION: retired  PLOF: Independent with basic ADLs  PATIENT GOALS: improved balance and strength  NEXT MD VISIT: 11/02/23  OBJECTIVE:  Note: Objective measures were completed at Evaluation unless otherwise noted.  DIAGNOSTIC FINDINGS: 03/13/23 right ankle x-ray Three-view radiographs of the right ankle shows stable internal fixation of the fibular fracture medial malleolus fracture and syndesmosis disruption.  Patient has calcification of the anterior tibial and dorsalis pedis artery.  PATIENT SURVEYS:  LEFS  Extreme difficulty/unable (0), Quite a bit of difficulty (1), Moderate difficulty (2), Little difficulty (3), No difficulty (4) Survey date:  10/17/23  Any of your usual work, housework or school activities 3  2. Usual hobbies, recreational or sporting activities 3  3. Getting into/out of the bath 4  4. Walking between rooms 4  5. Putting on socks/shoes 4  6. Squatting  3  7. Lifting an object, like a bag of groceries from the floor 3  8. Performing light activities around your home 3  9. Performing heavy activities around your home 2  10. Getting into/out of a car 3  11. Walking 2 blocks 3  12. Walking 1 mile 3  13. Going up/down 10 stairs (1 flight) 3  14. Standing for 1 hour 3  15.  sitting for 1 hour 4  16. Running on even ground 1  17. Running on uneven ground 1   18. Making sharp turns while running fast 1  19. Hopping  1  20. Rolling over in bed 4  Score total:  56/80     COGNITION: Overall cognitive status: Within functional limits for tasks assessed     SENSATION: Patient reports no numbness or tingling  LOWER EXTREMITY ROM:  Active ROM Right eval Left eval  Hip flexion    Hip extension    Hip abduction    Hip adduction    Hip internal rotation    Hip external rotation    Knee flexion    Knee extension    Ankle dorsiflexion -3 5  Ankle plantarflexion 27 29  Ankle inversion 12 23  Ankle eversion 7 16   (Blank rows = not tested)  LOWER EXTREMITY MMT:  MMT Right eval Left eval  Hip flexion 4-/5 4/5  Hip extension    Hip abduction    Hip adduction    Hip internal rotation    Hip external rotation    Knee flexion 5/5 5/5  Knee extension 4+/5 4+/5  Ankle dorsiflexion 4/5  5/5  Ankle plantarflexion    Ankle inversion 4/5 5/5  Ankle eversion 4/5 5/5   (Blank rows = not tested)  GAIT: Assistive device utilized: None Level of assistance: Complete Independence                                                                                                            TREATMENT DATE:                                     10/27/23 EXERCISE LOG  Exercise Repetitions and Resistance Comments  Nustep  L5 x 16 minutes    Rocker board  5 minutes  BUE support from parallel bars   Side stepping on foam  3 minutes  BUE support from parallel bars  Seated ankle PF  Blue t-band x 3 minutes   Dynadisc  2 minutes  Circles  Step up  6 step x 20 reps  Leading with RLE    Blank cell = exercise not performed today                                    10/24/23 EXERCISE LOG  Exercise Repetitions and Resistance Comments  Nustep  L5 x 12 minutes   Rocker board  4.5 minutes    Standing gastroc stretch  2.5 minutes   Static stance on foam  3 minutes  Intermittent UE support from parallel bars   Marching on foam  2.5 minutes  BUE support from  parallel bars   Ankle circles  2.5 minutes     Blank cell = exercise not performed today   PATIENT EDUCATION:  Education details:  Person educated: Patient Education method: Explanation Education comprehension: verbalized understanding  HOME EXERCISE PROGRAM:   ASSESSMENT:  CLINICAL IMPRESSION: Patient was progressed with multiple new interventions for improved lower extremity strength, stability, and mobility. He required minimal cueing with circles on dynadisc for hip stability to promote ankle mobility. He experienced no pain or discomfort with any of today's interventions. He reported feeling good upon the conclusion of treatment. He continues to require skilled physical therapy to address his remaining impairments to return to his prior level of function.   OBJECTIVE IMPAIRMENTS: decreased activity tolerance, decreased balance, decreased mobility, difficulty walking, decreased ROM, decreased strength, and hypomobility.   ACTIVITY LIMITATIONS: locomotion level  PARTICIPATION LIMITATIONS: community activity  PERSONAL FACTORS: Past/current experiences, Time since onset of injury/illness/exacerbation, Transportation, and 3+ comorbidities: Atrial fibrillation, congestive heart failure, hypertension, COPD, diabetes type 1, history of cancer, history of a CVA, and glaucoma are also affecting patient's functional outcome.   REHAB POTENTIAL: Good  CLINICAL DECISION MAKING: Evolving/moderate complexity  EVALUATION COMPLEXITY: Moderate   GOALS: Goals reviewed with patient? Yes  LONG TERM GOALS: Target date: 11/14/23  Patient will be independent with his HEP. Baseline:  Goal status: INITIAL  2.  Patient will improve his right ankle  dorsiflexion by at least 6 degrees for improved gait mechanics. Baseline:  Goal status: INITIAL  3.  Patient will report being able to walk at least half a mile to return to his normal community activities. Baseline:  Goal status: INITIAL  4.   Patient will improve his LEFS score to at least 67/80 for improved perceived function with his daily activities. Baseline:  Goal status: INITIAL   PLAN:  PT FREQUENCY: 2x/week  PT DURATION: 4 weeks  PLANNED INTERVENTIONS: 02835- PT Re-evaluation, 97750- Physical Performance Testing, 97110-Therapeutic exercises, 97530- Therapeutic activity, [id number]- Neuromuscular re-education, 97535- Self Care, 02859- Manual therapy, [phone]- Gait training, [phone]- Electrical stimulation (unattended), 97016- Vasopneumatic device, Patient/Family education, Balance training, Stair training, Joint mobilization, Cryotherapy, and Moist heat  PLAN FOR NEXT SESSION: NuStep, manual therapy, lower extremity strengthening, and modalities as needed   Lacinda JAYSON Fass, PT 10/27/2023, 1:17 PM

## 2023-11-02 ENCOUNTER — Encounter: Payer: Self-pay | Admitting: Orthopedic Surgery

## 2023-11-02 ENCOUNTER — Ambulatory Visit (INDEPENDENT_AMBULATORY_CARE_PROVIDER_SITE_OTHER): Admitting: Orthopedic Surgery

## 2023-11-02 DIAGNOSIS — E13622 Other specified diabetes mellitus with other skin ulcer: Secondary | ICD-10-CM | POA: Diagnosis not present

## 2023-11-02 DIAGNOSIS — L97309 Non-pressure chronic ulcer of unspecified ankle with unspecified severity: Secondary | ICD-10-CM | POA: Diagnosis not present

## 2023-11-02 DIAGNOSIS — I87331 Chronic venous hypertension (idiopathic) with ulcer and inflammation of right lower extremity: Secondary | ICD-10-CM | POA: Diagnosis not present

## 2023-11-02 NOTE — Progress Notes (Signed)
 Office Visit Note   Patient: Greg Robertson           Date of Birth: 1950/07/27           MRN: [id number] Visit Date: 11/02/2023              Requested by: Dettinger, Fonda LABOR, MD [address] [place],  [place] [address] PCP: Dettinger, Fonda LABOR, MD  Chief Complaint  Patient presents with   Right Leg - Follow-up      HPI: Patient is a 73 year old gentleman who is seen in follow-up for venous insufficiency ulcer right lower extremity.  Assessment & Plan: Visit Diagnoses:  1. Chronic venous hypertension (idiopathic) with ulcer and inflammation of right lower extremity (HCC)   2. Ankle ulcer due to secondary DM [name])     Plan: Patient will continue with compression and exercise and elevation.  Follow-Up Instructions: No follow-ups on file.   Ortho Exam  Patient is alert, oriented, no adenopathy, well-dressed, normal affect, normal respiratory effort. Examination patient has brawny venous stasis changes and all ulcers have healed.  There is no drainage no cellulitis.  Patient is currently using Ace compression.    Imaging: No results found. No images are attached to the encounter.  Labs: Lab Results  Component Value Date   HGBA1C 9.0 (H) 09/20/2023   HGBA1C 9.2 (H) 12/08/2022   HGBA1C 9.9 (H) 10/27/2022   REPTSTATUS 03/11/2012 FINAL 03/10/2012   CULT NO GROWTH 03/10/2012     Lab Results  Component Value Date   ALBUMIN  4.1 09/20/2023   ALBUMIN  3.1 (L) 12/08/2022   ALBUMIN  4.1 04/20/2022    Lab Results  Component Value Date   MG 1.9 03/13/2012   MG 2.2 03/12/2012   MG 2.1 03/10/2012   Lab Results  Component Value Date   VD25OH 10.7 (L) 05/24/2018   VD25OH 14.1 (L) 01/17/2018   VD25OH 14.7 (L) 09/13/2017    No results found for: PREALBUMIN    Latest Ref Rng & Units 09/20/2023   11:04 AM 05/23/2023   10:17 AM 02/27/2023   12:35 PM  CBC EXTENDED  WBC 3.4 - 10.8 x10E3/uL 7.7  7.7  7.4   RBC 4.14 - 5.80 x10E6/uL 4.27  4.53  4.27   Hemoglobin 13.0  - 17.7 g/dL 87.5  87.0  87.7   HCT 37.5 - 51.0 % 39.0  39.0  37.1   Platelets 150 - 450 x10E3/uL 159  173  159   NEUT# 1.4 - 7.0 x10E3/uL 4.8     Lymph# 0.7 - 3.1 x10E3/uL 1.9        There is no height or weight on file to calculate BMI.  Orders:  No orders of the defined types were placed in this encounter.  No orders of the defined types were placed in this encounter.    Procedures: No procedures performed  Clinical Data: No additional findings.  ROS:  All other systems negative, except as noted in the HPI. Review of Systems  Objective: Vital Signs: There were no vitals taken for this visit.  Specialty Comments:  No specialty comments available.  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   Mixed hyperlipidemia 08/22/2022   Chronic insomnia 11/24/2021   Retinopathy 11/24/2021   Central retinal vein occlusion,  right eye, stable 10/25/2021   History of CVA (cerebrovascular accident) 09/27/2020   A-fib (HCC) 09/27/2020   Left homonymous hemianopsia 09/27/2020   COPD (chronic obstructive pulmonary disease) (HCC) 09/05/2017   Aortic atherosclerosis (HCC) 09/04/2017   Benign prostatic hyperplasia 09/04/2017   Hypertension, essential 03/14/2012   Chronic CHF (congestive heart failure) (HCC) 03/12/2012   Lynch syndrome 07/28/2011   History of colon cancer 05/25/2011   DM type 1 (diabetes mellitus, type 1) (HCC) 05/10/2011   Glaucoma 05/10/2011   Past Medical History:  Diagnosis Date   Allergic rhinitis    Anemia    Atrial fibrillation (HCC)    Persistent   Cancer of ascending colon, 7cm 05/25/2011   Congestive heart failure (CHF) (HCC)    Diabetes mellitus type I (HCC)    Glaucoma    Hypertension    Pneumonia 1979 or 1980   Prostate  nodule    Substance abuse (HCC)     Family History  Problem Relation Age of Onset   Breast cancer Mother    Pancreatitis Mother        intestinal adhesions   Insomnia Mother    Colon polyps Father    Lung cancer Father    Diabetes Father    Prostate cancer Father    Colon cancer Neg Hx    Rectal cancer Neg Hx    Esophageal cancer Neg Hx    Inflammatory bowel disease Neg Hx    Liver disease Neg Hx    Pancreatic cancer Neg Hx    Stomach cancer Neg Hx     Past Surgical History:  Procedure Laterality Date   broken left shoulder blade and collar bone     COLONOSCOPY WITH PROPOFOL  N/A 04/29/2021   Procedure: COLONOSCOPY WITH PROPOFOL ;  Surgeon: Teressa Toribio SQUIBB, MD;  Location: [facility];  Service: Endoscopy;  Laterality: N/A;   ESOPHAGOGASTRODUODENOSCOPY (EGD) WITH PROPOFOL  N/A 04/29/2021   Procedure: ESOPHAGOGASTRODUODENOSCOPY (EGD) WITH PROPOFOL ;  Surgeon: Teressa Toribio SQUIBB, MD;  Location: [facility];  Service: Endoscopy;  Laterality: N/A;   FINGER SURGERY  2009   right middle   HEMOSTASIS CLIP PLACEMENT  04/29/2021   Procedure: HEMOSTASIS CLIP PLACEMENT;  Surgeon: Teressa Toribio SQUIBB, MD;  Location: [facility];  Service: Endoscopy;;   LAPAROSCOPIC ASSISTED 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     NASAL FRACTURE SURGERY  1968   ORIF ANKLE FRACTURE Right 12/08/2022   Procedure: OPEN REDUCTION INTERNAL FIXATION (ORIF) ANKLE FRACTURE;  Surgeon: Georgina Ozell LABOR, MD;  Location: [facility];  Service: Orthopedics;  Laterality: Right;   POLYPECTOMY  04/29/2021   Procedure: POLYPECTOMY;  Surgeon: Teressa Toribio SQUIBB, MD;  Location: [facility];  Service: Endoscopy;;   PORTACATH PLACEMENT  07/01/2011   Procedure: INSERTION PORT-A-CATH;  Surgeon: Elspeth KYM Schultze, MD;  Location: [facility];  Service: General;  Laterality: Left;  Insertion of Port-A-Catheter Left Internal Jugular   RIGHT/LEFT HEART CATH AND CORONARY ANGIOGRAPHY N/A 12/08/2020   Procedure: RIGHT/LEFT HEART CATH AND CORONARY  ANGIOGRAPHY;  Surgeon: Rolan Ezra RAMAN, MD;  Location: [facility] INVASIVE CV LAB;  Service: Cardiovascular;  Laterality: N/A;   TONSILLECTOMY  1957 - approximate   Social History   Occupational History   Occupation: Retired    Comment: Airline pilot  Tobacco Use   Smoking status: Former    Current packs/day: 0.00    Average packs/day: 1 pack/day for 15.0 years (15.0 ttl pk-yrs)    Types: Cigarettes    Start date: 04/18/1978    Quit date: 04/18/1993  Years since quitting: 30.5   Smokeless tobacco: Never   Tobacco comments:    marijuana every night   Vaping Use   Vaping status: Never Used  Substance and Sexual Activity   Alcohol use: Not Currently    Alcohol/week: 5.0 standard drinks of alcohol    Types: 3 Cans of beer, 2 Shots of liquor per week    Comment: 1 drink every 2 days   Drug use: Not Currently    Types: Marijuana    Comment: once a night marijuana   Sexual activity: Not Currently

## 2023-11-03 ENCOUNTER — Encounter: Payer: Self-pay | Admitting: *Deleted

## 2023-11-03 ENCOUNTER — Ambulatory Visit: Admitting: *Deleted

## 2023-11-03 DIAGNOSIS — M25671 Stiffness of right ankle, not elsewhere classified: Secondary | ICD-10-CM

## 2023-11-03 DIAGNOSIS — M6281 Muscle weakness (generalized): Secondary | ICD-10-CM

## 2023-11-03 NOTE — Therapy (Signed)
 OUTPATIENT PHYSICAL THERAPY LOWER EXTREMITY TREATMENT   Patient Name: Greg Robertson MRN: [id number] DOB:24-Apr-1950, 73 y.o., male Today's Date: 11/03/2023  END OF SESSION:  PT End of Session - 11/03/23 1012     Visit Number 4    Number of Visits 8    Date for PT Re-Evaluation 01/12/24    PT Start Time 1015    PT Stop Time 1101    PT Time Calculation (min) 46 min            Past Medical History:  Diagnosis Date   Allergic rhinitis    Anemia    Atrial fibrillation (HCC)    Persistent   Cancer of ascending colon, 7cm 05/25/2011   Congestive heart failure (CHF) (HCC)    Diabetes mellitus type I (HCC)    Glaucoma    Hypertension    Pneumonia 1979 or 1980   Prostate nodule    Substance abuse (HCC)    Past Surgical History:  Procedure Laterality Date   broken left shoulder blade and collar bone     COLONOSCOPY WITH PROPOFOL  N/A 04/29/2021   Procedure: COLONOSCOPY WITH PROPOFOL ;  Surgeon: Teressa Toribio SQUIBB, MD;  Location: [facility];  Service: Endoscopy;  Laterality: N/A;   ESOPHAGOGASTRODUODENOSCOPY (EGD) WITH PROPOFOL  N/A 04/29/2021   Procedure: ESOPHAGOGASTRODUODENOSCOPY (EGD) WITH PROPOFOL ;  Surgeon: Teressa Toribio SQUIBB, MD;  Location: [facility];  Service: Endoscopy;  Laterality: N/A;   FINGER SURGERY  2009   right middle   HEMOSTASIS CLIP PLACEMENT  04/29/2021   Procedure: HEMOSTASIS CLIP PLACEMENT;  Surgeon: Teressa Toribio SQUIBB, MD;  Location: [facility];  Service: Endoscopy;;   LAPAROSCOPIC ASSISTED 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     NASAL FRACTURE SURGERY  1968   ORIF ANKLE FRACTURE Right 12/08/2022   Procedure: OPEN REDUCTION INTERNAL FIXATION (ORIF) ANKLE FRACTURE;  Surgeon: Georgina Ozell LABOR, MD;  Location: [facility];  Service: Orthopedics;  Laterality: Right;   POLYPECTOMY  04/29/2021   Procedure: POLYPECTOMY;  Surgeon: Teressa Toribio SQUIBB, MD;  Location: [facility];  Service: Endoscopy;;   PORTACATH PLACEMENT  07/01/2011   Procedure: INSERTION  PORT-A-CATH;  Surgeon: Elspeth KYM Schultze, MD;  Location: [facility];  Service: General;  Laterality: Left;  Insertion of Port-A-Catheter Left Internal Jugular   RIGHT/LEFT HEART CATH AND CORONARY ANGIOGRAPHY N/A 12/08/2020   Procedure: RIGHT/LEFT HEART CATH AND CORONARY ANGIOGRAPHY;  Surgeon: Rolan Ezra RAMAN, MD;  Location[facility] INVASIVE CV LAB;  Service: Cardiovascular;  Laterality: N/A;   TONSILLECTOMY  1957 - approximate   Patient Active Problem List   Diagnosis Date Noted   Closed fracture dislocation of right ankle joint 12/08/2022   Change in bowel habits 10/29/2022   Chronic anticoagulation 10/29/2022   History of colectomy 10/29/2022   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   Mixed hyperlipidemia 08/22/2022   Chronic insomnia 11/24/2021   Retinopathy 11/24/2021   Central retinal vein occlusion, right eye, stable 10/25/2021   History of CVA (cerebrovascular accident) 09/27/2020   A-fib (HCC) 09/27/2020   Left homonymous hemianopsia 09/27/2020   COPD (chronic obstructive pulmonary disease) (HCC) 09/05/2017   Aortic atherosclerosis (HCC) 09/04/2017   Benign prostatic hyperplasia 09/04/2017   Hypertension, essential 03/14/2012   Chronic CHF (congestive heart failure) (HCC) 03/12/2012   Lynch syndrome 07/28/2011   History of colon cancer 05/25/2011   DM type 1 (diabetes mellitus, type 1) (HCC) 05/10/2011   Glaucoma 05/10/2011   REFERRING PROVIDER: Harden Jerona GAILS, MD   REFERRING DIAG: Closed fracture dislocation of right ankle, initial encounter,  Chronic venous hypertension (idiopathic) with ulcer and inflammation of right lower extremity   THERAPY DIAG:  Stiffness of right ankle, not elsewhere classified  Muscle weakness (generalized)  Rationale for Evaluation and Treatment: Rehabilitation  ONSET DATE: August 2024  SUBJECTIVE:   SUBJECTIVE STATEMENT: Patient reports that he is doing better with  RT ankle. DC from surgeon yesterday. Feeling light headed some today  PERTINENT  HISTORY: Atrial fibrillation, congestive heart failure, hypertension, COPD, diabetes type 1, history of cancer, history of a CVA, and glaucoma PAIN:  Are you having pain? No  PRECAUTIONS: None  RED FLAGS: None   WEIGHT BEARING RESTRICTIONS: No  FALLS:  Has patient fallen in last 6 months? No  LIVING ENVIRONMENT: Lives with: lives alone, but has an aide that comes in twice per week Lives in: House/apartment Stairs: Yes: Internal: 14 steps; on right going up and External: 3 steps; none Has following equipment at home: None  OCCUPATION: retired  PLOF: Independent with basic ADLs  PATIENT GOALS: improved balance and strength  NEXT MD VISIT: 11/02/23  OBJECTIVE:  Note: Objective measures were completed at Evaluation unless otherwise noted.  DIAGNOSTIC FINDINGS: 03/13/23 right ankle x-ray Three-view radiographs of the right ankle shows stable internal fixation of the fibular fracture medial malleolus fracture and syndesmosis disruption.  Patient has calcification of the anterior tibial and dorsalis pedis artery.  PATIENT SURVEYS:  LEFS  Extreme difficulty/unable (0), Quite a bit of difficulty (1), Moderate difficulty (2), Little difficulty (3), No difficulty (4) Survey date:  10/17/23  Any of your usual work, housework or school activities 3  2. Usual hobbies, recreational or sporting activities 3  3. Getting into/out of the bath 4  4. Walking between rooms 4  5. Putting on socks/shoes 4  6. Squatting  3  7. Lifting an object, like a bag of groceries from the floor 3  8. Performing light activities around your home 3  9. Performing heavy activities around your home 2  10. Getting into/out of a car 3  11. Walking 2 blocks 3  12. Walking 1 mile 3  13. Going up/down 10 stairs (1 flight) 3  14. Standing for 1 hour 3  15.  sitting for 1 hour 4  16. Running on even ground 1  17. Running on uneven ground 1  18. Making sharp turns while running fast 1  19. Hopping  1  20.  Rolling over in bed 4  Score total:  56/80     COGNITION: Overall cognitive status: Within functional limits for tasks assessed     SENSATION: Patient reports no numbness or tingling  LOWER EXTREMITY ROM:  Active ROM Right eval Left eval  Hip flexion    Hip extension    Hip abduction    Hip adduction    Hip internal rotation    Hip external rotation    Knee flexion    Knee extension    Ankle dorsiflexion -3 5  Ankle plantarflexion 27 29  Ankle inversion 12 23  Ankle eversion 7 16   (Blank rows = not tested)  LOWER EXTREMITY MMT:  MMT Right eval Left eval  Hip flexion 4-/5 4/5  Hip extension    Hip abduction    Hip adduction    Hip internal rotation    Hip external rotation    Knee flexion 5/5 5/5  Knee extension 4+/5 4+/5  Ankle dorsiflexion 4/5 5/5  Ankle plantarflexion    Ankle inversion 4/5 5/5  Ankle eversion 4/5 5/5   (  Blank rows = not tested)  GAIT: Assistive device utilized: None Level of assistance: Complete Independence                                                                                                            TREATMENT DATE:                                     11/03/23 EXERCISE LOG  RT ankle  Exercise Repetitions and Resistance Comments  Nustep  L5 x 16 minutes    Rocker board  5 minutes  BUE support from parallel bars   Side stepping on foam   BUE support from parallel bars  Seated ankle PF     Dynadisc  Seated  5 minutes  Circles  Step up   Leading with RLE   14in lunge 2x10  Ankle ROM   Ankle Isolator  1#  x 5 mins all motions    Blank cell = exercise not performed today                                    10/24/23 EXERCISE LOG  Exercise Repetitions and Resistance Comments  Nustep  L5 x 12 minutes   Rocker board  4.5 minutes    Standing gastroc stretch  2.5 minutes   Static stance on foam  3 minutes  Intermittent UE support from parallel bars   Marching on foam  2.5 minutes  BUE support from parallel bars   Ankle  circles  2.5 minutes     Blank cell = exercise not performed today   PATIENT EDUCATION:  Education details:  Person educated: Patient Education method: Explanation Education comprehension: verbalized understanding  HOME EXERCISE PROGRAM:   ASSESSMENT:  CLINICAL IMPRESSION: Patient  arrived today doing fairly well, but c/o of intermittent light headedness. Also reports MD has been tweaking his BP meds and BP has been running low. Today BP was 78/52 end of session and Pt was going to call MD office. Pt was able to perform standing and seated exs today for RT ankle and did well.     OBJECTIVE IMPAIRMENTS: decreased activity tolerance, decreased balance, decreased mobility, difficulty walking, decreased ROM, decreased strength, and hypomobility.   ACTIVITY LIMITATIONS: locomotion level  PARTICIPATION LIMITATIONS: community activity  PERSONAL FACTORS: Past/current experiences, Time since onset of injury/illness/exacerbation, Transportation, and 3+ comorbidities: Atrial fibrillation, congestive heart failure, hypertension, COPD, diabetes type 1, history of cancer, history of a CVA, and glaucoma are also affecting patient's functional outcome.   REHAB POTENTIAL: Good  CLINICAL DECISION MAKING: Evolving/moderate complexity  EVALUATION COMPLEXITY: Moderate   GOALS: Goals reviewed with patient? Yes  LONG TERM GOALS: Target date: 11/14/23  Patient will be independent with his HEP. Baseline:  Goal status: INITIAL  2.  Patient will improve his right ankle dorsiflexion by at least 6 degrees for improved gait mechanics. Baseline:  Goal  status: INITIAL  3.  Patient will report being able to walk at least half a mile to return to his normal community activities. Baseline:  Goal status: INITIAL  4.  Patient will improve his LEFS score to at least 67/80 for improved perceived function with his daily activities. Baseline:  Goal status: INITIAL   PLAN:  PT FREQUENCY: 2x/week  PT  DURATION: 4 weeks  PLANNED INTERVENTIONS: 97164- PT Re-evaluation, 97750- Physical Performance Testing, 97110-Therapeutic exercises, 97530- Therapeutic activity, [id number]- Neuromuscular re-education, 97535- Self Care, [id number]- Manual therapy, [phone]- Gait training, [phone]- Electrical stimulation (unattended), 97016- Vasopneumatic device, Patient/Family education, Balance training, Stair training, Joint mobilization, Cryotherapy, and Moist heat  PLAN FOR NEXT SESSION: NuStep, manual therapy, lower extremity strengthening, and modalities as needed   [place] Skalicky,CHRIS, PTA 11/03/2023, 12:06 PM

## 2023-11-07 ENCOUNTER — Ambulatory Visit

## 2023-11-07 DIAGNOSIS — M25671 Stiffness of right ankle, not elsewhere classified: Secondary | ICD-10-CM

## 2023-11-07 DIAGNOSIS — M6281 Muscle weakness (generalized): Secondary | ICD-10-CM

## 2023-11-07 NOTE — Therapy (Signed)
 OUTPATIENT PHYSICAL THERAPY LOWER EXTREMITY TREATMENT   Patient Name: Greg Robertson MRN: [id number] DOB:1951-03-28, 73 y.o., male Today's Date: 11/07/2023  END OF SESSION:  PT End of Session - 11/07/23 1107     Visit Number 5    Number of Visits 8    Date for PT Re-Evaluation 01/12/24    PT Start Time 1100    PT Stop Time 1145    PT Time Calculation (min) 45 min    Activity Tolerance Patient tolerated treatment well    Behavior During Therapy [facility] for tasks assessed/performed             Past Medical History:  Diagnosis Date   Allergic rhinitis    Anemia    Atrial fibrillation (HCC)    Persistent   Cancer of ascending colon, 7cm 05/25/2011   Congestive heart failure (CHF) (HCC)    Diabetes mellitus type I (HCC)    Glaucoma    Hypertension    Pneumonia 1979 or 1980   Prostate nodule    Substance abuse (HCC)    Past Surgical History:  Procedure Laterality Date   broken left shoulder blade and collar bone     COLONOSCOPY WITH PROPOFOL  N/A 04/29/2021   Procedure: COLONOSCOPY WITH PROPOFOL ;  Surgeon: Teressa Toribio SQUIBB, MD;  Location: [facility];  Service: Endoscopy;  Laterality: N/A;   ESOPHAGOGASTRODUODENOSCOPY (EGD) WITH PROPOFOL  N/A 04/29/2021   Procedure: ESOPHAGOGASTRODUODENOSCOPY (EGD) WITH PROPOFOL ;  Surgeon: Teressa Toribio SQUIBB, MD;  Location: [facility];  Service: Endoscopy;  Laterality: N/A;   FINGER SURGERY  2009   right middle   HEMOSTASIS CLIP PLACEMENT  04/29/2021   Procedure: HEMOSTASIS CLIP PLACEMENT;  Surgeon: Teressa Toribio SQUIBB, MD;  [facility]: [facility];  Service: Endoscopy;;   LAPAROSCOPIC ASSISTED 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     NASAL FRACTURE SURGERY  1968   ORIF ANKLE FRACTURE Right 12/08/2022   Procedure: OPEN REDUCTION INTERNAL FIXATION (ORIF) ANKLE FRACTURE;  Surgeon: Georgina Ozell LABOR, MD;  Location: [facility];  Service: Orthopedics;  Laterality: Right;   POLYPECTOMY  04/29/2021   Procedure: POLYPECTOMY;  Surgeon: Teressa Toribio SQUIBB, MD;  [facility]: [facility];  Service: Endoscopy;;   PORTACATH PLACEMENT  07/01/2011   Procedure: INSERTION PORT-A-CATH;  Surgeon: Elspeth KYM Schultze, MD;  Location: [facility];  Service: General;  Laterality: Left;  Insertion of Port-A-Catheter Left Internal Jugular   RIGHT/LEFT HEART CATH AND CORONARY ANGIOGRAPHY N/A 12/08/2020   Procedure: RIGHT/LEFT HEART CATH AND CORONARY ANGIOGRAPHY;  Surgeon: Rolan Ezra RAMAN, MD;  Location: [facility] INVASIVE CV LAB;  Service: Cardiovascular;  Laterality: N/A;   TONSILLECTOMY  1957 - approximate   Patient Active Problem List   Diagnosis Date Noted   Closed fracture dislocation of right ankle joint 12/08/2022   Change in bowel habits 10/29/2022   Chronic anticoagulation 10/29/2022   History of colectomy 10/29/2022   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   Mixed hyperlipidemia 08/22/2022   Chronic insomnia 11/24/2021   Retinopathy 11/24/2021   Central retinal vein occlusion, right eye, stable 10/25/2021   History of CVA (cerebrovascular accident) 09/27/2020   A-fib (HCC) 09/27/2020   Left homonymous hemianopsia 09/27/2020   COPD (chronic obstructive pulmonary disease) (HCC) 09/05/2017   Aortic atherosclerosis (HCC) 09/04/2017   Benign prostatic hyperplasia 09/04/2017   Hypertension, essential 03/14/2012   Chronic CHF (congestive heart failure) (HCC) 03/12/2012   Lynch syndrome 07/28/2011   History of colon cancer 05/25/2011   DM type 1 (diabetes mellitus, type 1) (HCC) 05/10/2011   Glaucoma 05/10/2011  REFERRING PROVIDER: Harden Jerona GAILS, MD   REFERRING DIAG: Closed fracture dislocation of right ankle, initial encounter, Chronic venous hypertension (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   PERTINENT HISTORY: Atrial fibrillation, congestive heart failure, hypertension, COPD, diabetes type 1, history of cancer, history of a CVA, and glaucoma PAIN:  Are you having pain? No  PRECAUTIONS: None  RED FLAGS: None   WEIGHT BEARING RESTRICTIONS: No  FALLS:  Has patient fallen in last 6 months? No  LIVING ENVIRONMENT: Lives with: lives alone, but has an aide that comes in twice per week Lives in: House/apartment Stairs: Yes: Internal: 14 steps; on right going up and External: 3 steps; none Has following equipment at home: None  OCCUPATION: retired  PLOF: Independent with basic ADLs  PATIENT GOALS: improved balance and strength  NEXT MD VISIT: 11/02/23  OBJECTIVE:  Note: Objective measures were completed at Evaluation unless otherwise noted.  DIAGNOSTIC FINDINGS: 03/13/23 right ankle x-ray Three-view radiographs of the right ankle shows stable internal fixation of the fibular fracture medial malleolus fracture and syndesmosis disruption.  Patient has calcification of the anterior tibial and dorsalis pedis artery.  PATIENT SURVEYS:  LEFS  Extreme difficulty/unable (0), Quite a bit of difficulty (1), Moderate difficulty (2), Little difficulty (3), No difficulty (4) Survey date:  10/17/23 11/07/23  Any of your usual work, housework or school activities 3 3  2. Usual hobbies, recreational or sporting activities 3 1  3. Getting into/out of the bath 4 3  4. Walking between rooms 4 4  5. Putting on socks/shoes 4 4  6. Squatting  3 3  7. Lifting an object, like a bag of groceries from the floor 3 3  8. Performing light activities around your home 3 3  9. Performing heavy activities around your home 2 2  10. Getting into/out of a car 3 3  11. Walking 2 blocks 3 2  12. Walking 1 mile 3 0  13. Going up/down 10 stairs (1 flight) 3 3  14. Standing for 1 hour 3 0  15.  sitting for 1 hour 4 4  16. Running on even ground 1 0  17. Running on uneven ground 1 0   18. Making sharp turns while running fast 1 0  19. Hopping  1 0  20. Rolling over in bed 4 4  Score total:  56/80 42/80     COGNITION: Overall cognitive status: Within functional limits for tasks assessed     SENSATION: Patient reports no numbness or tingling  LOWER EXTREMITY ROM:  Active ROM Right eval Right 11/07/23 Left eval  Hip flexion     Hip extension     Hip abduction     Hip adduction     Hip internal rotation     Hip external rotation     Knee flexion     Knee extension     Ankle dorsiflexion -3 1 5   Ankle plantarflexion 27  29  Ankle inversion 12 12 23   Ankle eversion 7 20 16    (Blank rows = not tested)  LOWER EXTREMITY MMT:  MMT Right eval Left eval  Hip flexion 4-/5 4/5  Hip extension    Hip abduction    Hip adduction  Hip internal rotation    Hip external rotation    Knee flexion 5/5 5/5  Knee extension 4+/5 4+/5  Ankle dorsiflexion 4/5 5/5  Ankle plantarflexion    Ankle inversion 4/5 5/5  Ankle eversion 4/5 5/5   (Blank rows = not tested)  GAIT: Assistive device utilized: None Level of assistance: Complete Independence                                                                                                            TREATMENT DATE:                                     11/07/23 EXERCISE LOG  Exercise Repetitions and Resistance Comments  Nustep  L4-5 x 15 minutes   Rocker board  5 minutes   Goal assessment     Step up  6 step x 20 reps  RLE leading   Standing gastroc stretch  4 x 30 seconds    Blank cell = exercise not performed today                                    11/03/23 EXERCISE LOG  RT ankle  Exercise Repetitions and Resistance Comments  Nustep  L5 x 16 minutes    Rocker board  5 minutes  BUE support from parallel bars   Side stepping on foam   BUE support from parallel bars  Seated ankle PF     Dynadisc  Seated  5 minutes  Circles  Step up   Leading with RLE   14in lunge 2x10  Ankle ROM   Ankle Isolator  1#  x  5 mins all motions    Blank cell = exercise not performed today                                    10/24/23 EXERCISE LOG  Exercise Repetitions and Resistance Comments  Nustep  L5 x 12 minutes   Rocker board  4.5 minutes    Standing gastroc stretch  2.5 minutes   Static stance on foam  3 minutes  Intermittent UE support from parallel bars   Marching on foam  2.5 minutes  BUE support from parallel bars   Ankle circles  2.5 minutes     Blank cell = exercise not performed today   PATIENT EDUCATION:  Education details: progress with physical therapy Person educated: Patient Education method: Explanation Education comprehension: verbalized understanding  HOME EXERCISE PROGRAM:   ASSESSMENT:  CLINICAL IMPRESSION: Patient is making fair progress with skilled physical therapy as evidenced by his LEFS score, objective measures, functional mobility, and progress toward his goals. He was able to demonstrate improved functional mobility meeting his long term goal. He was also able to demonstrate improved right ankle mobility. However, he has yet to  meet his long term goal. Recommend that he continue with his current plan of care to address his remaining impairments to return to his prior level of function.   OBJECTIVE IMPAIRMENTS: decreased activity tolerance, decreased balance, decreased mobility, difficulty walking, decreased ROM, decreased strength, and hypomobility.   ACTIVITY LIMITATIONS: locomotion level  PARTICIPATION LIMITATIONS: community activity  PERSONAL FACTORS: Past/current experiences, Time since onset of injury/illness/exacerbation, Transportation, and 3+ comorbidities: Atrial fibrillation, congestive heart failure, hypertension, COPD, diabetes type 1, history of cancer, history of a CVA, and glaucoma are also affecting patient's functional outcome.   REHAB POTENTIAL: Good  CLINICAL DECISION MAKING: Evolving/moderate complexity  EVALUATION COMPLEXITY:  Moderate   GOALS: Goals reviewed with patient? Yes  LONG TERM GOALS: Target date: 11/14/23  Patient will be independent with his HEP. Baseline:  Goal status: MET  2.  Patient will improve his right ankle dorsiflexion by at least 6 degrees for improved gait mechanics. Baseline:  Goal status: INITIAL  3.  Patient will report being able to walk at least half a mile to return to his normal community activities. Baseline: at least 0.5 miles Goal status: MET  4.  Patient will improve his LEFS score to at least 67/80 for improved perceived function with his daily activities. Baseline:  Goal status: INITIAL   PLAN:  PT FREQUENCY: 2x/week  PT DURATION: 4 weeks  PLANNED INTERVENTIONS: 02835- PT Re-evaluation, 97750- Physical Performance Testing, 97110-Therapeutic exercises, 97530- Therapeutic activity, [id number]- Neuromuscular re-education, 97535- Self Care, 02859- Manual therapy, [phone]- Gait training, [phone]- Electrical stimulation (unattended), 97016- Vasopneumatic device, Patient/Family education, Balance training, Stair training, Joint mobilization, Cryotherapy, and Moist heat  PLAN FOR NEXT SESSION: NuStep, manual therapy, lower extremity strengthening, and modalities as needed   Lacinda JAYSON Fass, PT 11/07/2023, 1:00 PM

## 2023-11-10 ENCOUNTER — Ambulatory Visit

## 2023-11-10 VITALS — BP 76/49 | HR 82

## 2023-11-10 DIAGNOSIS — M6281 Muscle weakness (generalized): Secondary | ICD-10-CM

## 2023-11-10 DIAGNOSIS — M25671 Stiffness of right ankle, not elsewhere classified: Secondary | ICD-10-CM

## 2023-11-10 NOTE — Therapy (Signed)
 OUTPATIENT PHYSICAL THERAPY LOWER EXTREMITY TREATMENT   Patient Name: Greg Robertson MRN: [id number] DOB:July 20, 1950, 73 y.o., male Today's Date: 11/10/2023  END OF SESSION:  PT End of Session - 11/10/23 1019     Visit Number 6    Number of Visits 8    Date for PT Re-Evaluation 01/12/24    PT Start Time 1017    PT Stop Time 1100    PT Time Calculation (min) 43 min    Activity Tolerance Patient tolerated treatment well    Behavior During Therapy [facility] for tasks assessed/performed              Past Medical History:  Diagnosis Date   Allergic rhinitis    Anemia    Atrial fibrillation (HCC)    Persistent   Cancer of ascending colon, 7cm 05/25/2011   Congestive heart failure (CHF) (HCC)    Diabetes mellitus type I (HCC)    Glaucoma    Hypertension    Pneumonia 1979 or 1980   Prostate nodule    Substance abuse (HCC)    Past Surgical History:  Procedure Laterality Date   broken left shoulder blade and collar bone     COLONOSCOPY WITH PROPOFOL  N/A 04/29/2021   Procedure: COLONOSCOPY WITH PROPOFOL ;  Surgeon: Teressa Toribio SQUIBB, MD;  [facility]: [facility];  Service: Endoscopy;  Laterality: N/A;   ESOPHAGOGASTRODUODENOSCOPY (EGD) WITH PROPOFOL  N/A 04/29/2021   Procedure: ESOPHAGOGASTRODUODENOSCOPY (EGD) WITH PROPOFOL ;  Surgeon: Teressa Toribio SQUIBB, MD;  Location: [facility];  Service: Endoscopy;  Laterality: N/A;   FINGER SURGERY  2009   right middle   HEMOSTASIS CLIP PLACEMENT  04/29/2021   Procedure: HEMOSTASIS CLIP PLACEMENT;  Surgeon: Teressa Toribio SQUIBB, MD;  [facility];  Service: Endoscopy;;   LAPAROSCOPIC ASSISTED 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     NASAL FRACTURE SURGERY  1968   ORIF ANKLE FRACTURE Right 12/08/2022   Procedure: OPEN REDUCTION INTERNAL FIXATION (ORIF) ANKLE FRACTURE;  Surgeon: Georgina Ozell LABOR, MD;  Location: [facility];  Service: Orthopedics;  Laterality: Right;   POLYPECTOMY  04/29/2021   Procedure: POLYPECTOMY;  Surgeon: Teressa Toribio SQUIBB, MD;  [facility]: [facility];  Service: Endoscopy;;   PORTACATH PLACEMENT  07/01/2011   Procedure: INSERTION PORT-A-CATH;  Surgeon: Elspeth KYM Schultze, MD;  Location: [facility];  Service: General;  Laterality: Left;  Insertion of Port-A-Catheter Left Internal Jugular   RIGHT/LEFT HEART CATH AND CORONARY ANGIOGRAPHY N/A 12/08/2020   Procedure: RIGHT/LEFT HEART CATH AND CORONARY ANGIOGRAPHY;  Surgeon: Rolan Ezra RAMAN, MD;  Location: [facility] INVASIVE CV LAB;  Service: Cardiovascular;  Laterality: N/A;   TONSILLECTOMY  1957 - approximate   Patient Active Problem List   Diagnosis Date Noted   Closed fracture dislocation of right ankle joint 12/08/2022   Change in bowel habits 10/29/2022   Chronic anticoagulation 10/29/2022   History of colectomy 10/29/2022   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   Mixed hyperlipidemia 08/22/2022   Chronic insomnia 11/24/2021   Retinopathy 11/24/2021   Central retinal vein occlusion, right eye, stable 10/25/2021   History of CVA (cerebrovascular accident) 09/27/2020   A-fib (HCC) 09/27/2020   Left homonymous hemianopsia 09/27/2020   COPD (chronic obstructive pulmonary disease) (HCC) 09/05/2017   Aortic atherosclerosis (HCC) 09/04/2017   Benign prostatic hyperplasia 09/04/2017   Hypertension, essential 03/14/2012   Chronic CHF (congestive heart failure) (HCC) 03/12/2012   Lynch syndrome 07/28/2011   History of colon cancer 05/25/2011   DM type 1 (diabetes mellitus, type 1) (HCC) 05/10/2011   Glaucoma  05/10/2011   REFERRING PROVIDER: Harden Jerona GAILS, MD   REFERRING DIAG: Closed fracture dislocation of right ankle, initial encounter, Chronic venous hypertension (idiopathic) with ulcer and inflammation of right lower extremity   THERAPY DIAG:  Stiffness of right ankle, not elsewhere classified  Muscle weakness (generalized)  Rationale for Evaluation and Treatment: Rehabilitation  ONSET DATE: August 2024  SUBJECTIVE:   SUBJECTIVE STATEMENT: Patient reports that  he feels good today.   PERTINENT HISTORY: Atrial fibrillation, congestive heart failure, hypertension, COPD, diabetes type 1, history of cancer, history of a CVA, and glaucoma PAIN:  Are you having pain? No  PRECAUTIONS: None  RED FLAGS: None   WEIGHT BEARING RESTRICTIONS: No  FALLS:  Has patient fallen in last 6 months? No  LIVING ENVIRONMENT: Lives with: lives alone, but has an aide that comes in twice per week Lives in: House/apartment Stairs: Yes: Internal: 14 steps; on right going up and External: 3 steps; none Has following equipment at home: None  OCCUPATION: retired  PLOF: Independent with basic ADLs  PATIENT GOALS: improved balance and strength  NEXT MD VISIT: 11/02/23  OBJECTIVE:  Note: Objective measures were completed at Evaluation unless otherwise noted.  DIAGNOSTIC FINDINGS: 03/13/23 right ankle x-ray Three-view radiographs of the right ankle shows stable internal fixation of the fibular fracture medial malleolus fracture and syndesmosis disruption.  Patient has calcification of the anterior tibial and dorsalis pedis artery.  PATIENT SURVEYS:  LEFS  Extreme difficulty/unable (0), Quite a bit of difficulty (1), Moderate difficulty (2), Little difficulty (3), No difficulty (4) Survey date:  10/17/23 11/07/23  Any of your usual work, housework or school activities 3 3  2. Usual hobbies, recreational or sporting activities 3 1  3. Getting into/out of the bath 4 3  4. Walking between rooms 4 4  5. Putting on socks/shoes 4 4  6. Squatting  3 3  7. Lifting an object, like a bag of groceries from the floor 3 3  8. Performing light activities around your home 3 3  9. Performing heavy activities around your home 2 2  10. Getting into/out of a car 3 3  11. Walking 2 blocks 3 2  12. Walking 1 mile 3 0  13. Going up/down 10 stairs (1 flight) 3 3  14. Standing for 1 hour 3 0  15.  sitting for 1 hour 4 4  16. Running on even ground 1 0  17. Running on uneven ground  1 0  18. Making sharp turns while running fast 1 0  19. Hopping  1 0  20. Rolling over in bed 4 4  Score total:  56/80 42/80     COGNITION: Overall cognitive status: Within functional limits for tasks assessed     SENSATION: Patient reports no numbness or tingling  LOWER EXTREMITY ROM:  Active ROM Right eval Right 11/07/23 Left eval  Hip flexion     Hip extension     Hip abduction     Hip adduction     Hip internal rotation     Hip external rotation     Knee flexion     Knee extension     Ankle dorsiflexion -3 1 5   Ankle plantarflexion 27  29  Ankle inversion 12 12 23   Ankle eversion 7 20 16    (Blank rows = not tested)  LOWER EXTREMITY MMT:  MMT Right eval Left eval  Hip flexion 4-/5 4/5  Hip extension    Hip abduction    Hip  adduction    Hip internal rotation    Hip external rotation    Knee flexion 5/5 5/5  Knee extension 4+/5 4+/5  Ankle dorsiflexion 4/5 5/5  Ankle plantarflexion    Ankle inversion 4/5 5/5  Ankle eversion 4/5 5/5   (Blank rows = not tested)  GAIT: Assistive device utilized: None Level of assistance: Complete Independence                                                                                                            TREATMENT DATE:                                     11/10/23 EXERCISE LOG  Exercise Repetitions and Resistance Comments  Nustep  L5 x 15 minutes   Resisted PF  Green t-band x 1.5 minutes Blue t-band x 1.5 minutes   Standing gastroc stretch  2.5 minutes   Standing heel raise 2.5 minutes   Dynadisc seated  3 minutes  Circles       Blank cell = exercise not performed today                                    11/07/23 EXERCISE LOG  Exercise Repetitions and Resistance Comments  Nustep  L4-5 x 15 minutes   Rocker board  5 minutes   Goal assessment     Step up  6 step x 20 reps  RLE leading   Standing gastroc stretch  4 x 30 seconds    Blank cell = exercise not performed today                                     11/03/23 EXERCISE LOG  RT ankle  Exercise Repetitions and Resistance Comments  Nustep  L5 x 16 minutes    Rocker board  5 minutes  BUE support from parallel bars   Side stepping on foam   BUE support from parallel bars  Seated ankle PF     Dynadisc  Seated  5 minutes  Circles  Step up   Leading with RLE   14in lunge 2x10  Ankle ROM   Ankle Isolator  1#  x 5 mins all motions    Blank cell = exercise not performed today   PATIENT EDUCATION:  Education details: vitals, and contacting his PCP  Person educated: Patient Education method: Explanation Education comprehension: verbalized understanding  HOME EXERCISE PROGRAM:   ASSESSMENT:  CLINICAL IMPRESSION: Patient was progressed with familiar interventions for improved right ankle strength and mobility. He required minimal cueing with today's interventions for proper exercise performance. He experienced intermittent dizziness with sit to stand transfers today. His blood pressure was assessed at 76/49 in sitting. He was educated to contact his primary care provider regarding his blood pressure and he  reported understanding. He reported feeling alright upon the conclusion of treatment. He continues to require skilled physical therapy to address his remaining impairments to return to his prior level of function.   OBJECTIVE IMPAIRMENTS: decreased activity tolerance, decreased balance, decreased mobility, difficulty walking, decreased ROM, decreased strength, and hypomobility.   ACTIVITY LIMITATIONS: locomotion level  PARTICIPATION LIMITATIONS: community activity  PERSONAL FACTORS: Past/current experiences, Time since onset of injury/illness/exacerbation, Transportation, and 3+ comorbidities: Atrial fibrillation, congestive heart failure, hypertension, COPD, diabetes type 1, history of cancer, history of a CVA, and glaucoma are also affecting patient's functional outcome.   REHAB POTENTIAL: Good  CLINICAL DECISION MAKING:  Evolving/moderate complexity  EVALUATION COMPLEXITY: Moderate   GOALS: Goals reviewed with patient? Yes  LONG TERM GOALS: Target date: 11/14/23  Patient will be independent with his HEP. Baseline:  Goal status: MET  2.  Patient will improve his right ankle dorsiflexion by at least 6 degrees for improved gait mechanics. Baseline:  Goal status: INITIAL  3.  Patient will report being able to walk at least half a mile to return to his normal community activities. Baseline: at least 0.5 miles Goal status: MET  4.  Patient will improve his LEFS score to at least 67/80 for improved perceived function with his daily activities. Baseline:  Goal status: INITIAL   PLAN:  PT FREQUENCY: 2x/week  PT DURATION: 4 weeks  PLANNED INTERVENTIONS: 02835- PT Re-evaluation, 97750- Physical Performance Testing, 97110-Therapeutic exercises, 97530- Therapeutic activity, [id number]- Neuromuscular re-education, 97535- Self Care, 02859- Manual therapy, [phone]- Gait training, [phone]- Electrical stimulation (unattended), 97016- Vasopneumatic device, Patient/Family education, Balance training, Stair training, Joint mobilization, Cryotherapy, and Moist heat  PLAN FOR NEXT SESSION: NuStep, manual therapy, lower extremity strengthening, and modalities as needed   Lacinda JAYSON Fass, PT 11/10/2023, 11:22 AM

## 2023-11-16 ENCOUNTER — Other Ambulatory Visit: Payer: Self-pay

## 2023-11-16 DIAGNOSIS — N1832 Chronic kidney disease, stage 3b: Secondary | ICD-10-CM

## 2023-11-16 LAB — LAB REPORT - SCANNED
Albumin, Urine POC: 38.4
Albumin/Creatinine Ratio, Urine, POC: 19
EGFR: 34

## 2023-11-17 ENCOUNTER — Encounter

## 2023-11-20 ENCOUNTER — Ambulatory Visit
Admission: RE | Admit: 2023-11-20 | Discharge: 2023-11-20 | Disposition: A | Discharge status: Home / Self Care | Source: Ambulatory Visit

## 2023-11-20 DIAGNOSIS — N1832 Chronic kidney disease, stage 3b: Secondary | ICD-10-CM

## 2023-11-21 ENCOUNTER — Telehealth: Payer: Self-pay

## 2023-11-21 NOTE — Telephone Encounter (Signed)
 Pt made aware that he will need to have the kidney specialist order labs and make sure the resulting lab is [facility]. Pt understood and will call Dr. Keri office to make aware. Lab appt scheduled for 8/8 at 11:15am.

## 2023-11-21 NOTE — Telephone Encounter (Signed)
 Copied from CRM [id number]. Topic: Clinical - Request for Lab/Test Order >> Nov 20, 2023  4:29 PM Tobias CROME wrote: Reason for CRM: Patient referred to nephrologist in [place]. Nephrologist (Dr. Evalene Lanes - [facility]) wants patient to get a full renal panel lab work done, patient inquiring if panel could be completed at office.   Please reach out to patient to further assist, [phone]

## 2023-11-24 ENCOUNTER — Ambulatory Visit: Attending: Orthopedic Surgery

## 2023-11-24 ENCOUNTER — Other Ambulatory Visit

## 2023-11-24 VITALS — BP 75/47 | HR 84

## 2023-11-24 DIAGNOSIS — M25671 Stiffness of right ankle, not elsewhere classified: Secondary | ICD-10-CM | POA: Insufficient documentation

## 2023-11-24 DIAGNOSIS — M6281 Muscle weakness (generalized): Secondary | ICD-10-CM | POA: Diagnosis present

## 2023-11-24 DIAGNOSIS — C182 Malignant neoplasm of ascending colon: Secondary | ICD-10-CM

## 2023-11-24 DIAGNOSIS — I5022 Chronic systolic (congestive) heart failure: Secondary | ICD-10-CM

## 2023-11-24 NOTE — Therapy (Signed)
 OUTPATIENT PHYSICAL THERAPY LOWER EXTREMITY TREATMENT   Patient Name: Greg Robertson MRN: [id number] DOB:1950/09/20, 73 y.o., male Today's Date: 11/24/2023  END OF SESSION:  PT End of Session - 11/24/23 1029     Visit Number 7    Number of Visits 8    Date for PT Re-Evaluation 01/12/24    PT Start Time 1015    PT Stop Time 1059    PT Time Calculation (min) 44 min    Activity Tolerance Patient tolerated treatment well    Behavior During Therapy [facility] for tasks assessed/performed               Past Medical History:  Diagnosis Date   Allergic rhinitis    Anemia    Atrial fibrillation (HCC)    Persistent   Cancer of ascending colon, 7cm 05/25/2011   Congestive heart failure (CHF) (HCC)    Diabetes mellitus type I (HCC)    Glaucoma    Hypertension    Pneumonia 1979 or 1980   Prostate nodule    Substance abuse (HCC)    Past Surgical History:  Procedure Laterality Date   broken left shoulder blade and collar bone     COLONOSCOPY WITH PROPOFOL  N/A 04/29/2021   Procedure: COLONOSCOPY WITH PROPOFOL ;  Surgeon: Teressa Toribio SQUIBB, MD;  Location: [facility];  Service: Endoscopy;  Laterality: N/A;   ESOPHAGOGASTRODUODENOSCOPY (EGD) WITH PROPOFOL  N/A 04/29/2021   Procedure: ESOPHAGOGASTRODUODENOSCOPY (EGD) WITH PROPOFOL ;  Surgeon: Teressa Toribio SQUIBB, MD;  Location: [facility];  Service: Endoscopy;  Laterality: N/A;   FINGER SURGERY  2009   right middle   HEMOSTASIS CLIP PLACEMENT  04/29/2021   Procedure: HEMOSTASIS CLIP PLACEMENT;  Surgeon: Teressa Toribio SQUIBB, MD;  Location: [facility];  Service: Endoscopy;;   LAPAROSCOPIC ASSISTED 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     NASAL FRACTURE SURGERY  1968   ORIF ANKLE FRACTURE Right 12/08/2022   Procedure: OPEN REDUCTION INTERNAL FIXATION (ORIF) ANKLE FRACTURE;  Surgeon: Georgina Ozell LABOR, MD;  Location: [facility];  Service: Orthopedics;  Laterality: Right;   POLYPECTOMY  04/29/2021   Procedure: POLYPECTOMY;  Surgeon: Teressa Toribio SQUIBB, MD;  Location: [facility];  Service: Endoscopy;;   PORTACATH PLACEMENT  07/01/2011   Procedure: INSERTION PORT-A-CATH;  Surgeon: Elspeth KYM Schultze, MD;  Location: [facility]S;  Service: General;  Laterality: Left;  Insertion of Port-A-Catheter Left Internal Jugular   RIGHT/LEFT HEART CATH AND CORONARY ANGIOGRAPHY N/A 12/08/2020   Procedure: RIGHT/LEFT HEART CATH AND CORONARY ANGIOGRAPHY;  Surgeon: Rolan Ezra RAMAN, MD;  Location: [facility] INVASIVE CV LAB;  Service: Cardiovascular;  Laterality: N/A;   TONSILLECTOMY  1957 - approximate   Patient Active Problem List   Diagnosis Date Noted   Closed fracture dislocation of right ankle joint 12/08/2022   Change in bowel habits 10/29/2022   Chronic anticoagulation 10/29/2022   History of colectomy 10/29/2022   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   Mixed hyperlipidemia 08/22/2022   Chronic insomnia 11/24/2021   Retinopathy 11/24/2021   Central retinal vein occlusion, right eye, stable 10/25/2021   History of CVA (cerebrovascular accident) 09/27/2020   A-fib (HCC) 09/27/2020   Left homonymous hemianopsia 09/27/2020   COPD (chronic obstructive pulmonary disease) (HCC) 09/05/2017   Aortic atherosclerosis (HCC) 09/04/2017   Benign prostatic hyperplasia 09/04/2017   Hypertension, essential 03/14/2012   Chronic CHF (congestive heart failure) (HCC) 03/12/2012   Lynch syndrome 07/28/2011   History of colon cancer 05/25/2011   DM type 1 (diabetes mellitus, type 1) (HCC) 05/10/2011  Glaucoma 05/10/2011   REFERRING PROVIDER: Harden Jerona GAILS, MD   REFERRING DIAG: Closed fracture dislocation of right ankle, initial encounter, Chronic venous hypertension (idiopathic) with ulcer and inflammation of right lower extremity   THERAPY DIAG:  Stiffness of right ankle, not elsewhere classified  Muscle weakness (generalized)  Rationale for Evaluation and Treatment: Rehabilitation  ONSET DATE: August 2024  SUBJECTIVE:   SUBJECTIVE STATEMENT: Patient reports that  he feels alright today.   PERTINENT HISTORY: Atrial fibrillation, congestive heart failure, hypertension, COPD, diabetes type 1, history of cancer, history of a CVA, and glaucoma PAIN:  Are you having pain? No  PRECAUTIONS: None  RED FLAGS: None   WEIGHT BEARING RESTRICTIONS: No  FALLS:  Has patient fallen in last 6 months? No  LIVING ENVIRONMENT: Lives with: lives alone, but has an aide that comes in twice per week Lives in: House/apartment Stairs: Yes: Internal: 14 steps; on right going up and External: 3 steps; none Has following equipment at home: None  OCCUPATION: retired  PLOF: Independent with basic ADLs  PATIENT GOALS: improved balance and strength  NEXT MD VISIT: 11/02/23  OBJECTIVE:  Note: Objective measures were completed at Evaluation unless otherwise noted.  DIAGNOSTIC FINDINGS: 03/13/23 right ankle x-ray Three-view radiographs of the right ankle shows stable internal fixation of the fibular fracture medial malleolus fracture and syndesmosis disruption.  Patient has calcification of the anterior tibial and dorsalis pedis artery.  PATIENT SURVEYS:  LEFS  Extreme difficulty/unable (0), Quite a bit of difficulty (1), Moderate difficulty (2), Little difficulty (3), No difficulty (4) Survey date:  10/17/23 11/07/23  Any of your usual work, housework or school activities 3 3  2. Usual hobbies, recreational or sporting activities 3 1  3. Getting into/out of the bath 4 3  4. Walking between rooms 4 4  5. Putting on socks/shoes 4 4  6. Squatting  3 3  7. Lifting an object, like a bag of groceries from the floor 3 3  8. Performing light activities around your home 3 3  9. Performing heavy activities around your home 2 2  10. Getting into/out of a car 3 3  11. Walking 2 blocks 3 2  12. Walking 1 mile 3 0  13. Going up/down 10 stairs (1 flight) 3 3  14. Standing for 1 hour 3 0  15.  sitting for 1 hour 4 4  16. Running on even ground 1 0  17. Running on uneven  ground 1 0  18. Making sharp turns while running fast 1 0  19. Hopping  1 0  20. Rolling over in bed 4 4  Score total:  56/80 42/80     COGNITION: Overall cognitive status: Within functional limits for tasks assessed     SENSATION: Patient reports no numbness or tingling  LOWER EXTREMITY ROM:  Active ROM Right eval Right 11/07/23 Left eval  Hip flexion     Hip extension     Hip abduction     Hip adduction     Hip internal rotation     Hip external rotation     Knee flexion     Knee extension     Ankle dorsiflexion -3 1 5   Ankle plantarflexion 27  29  Ankle inversion 12 12 23   Ankle eversion 7 20 16    (Blank rows = not tested)  LOWER EXTREMITY MMT:  MMT Right eval Left eval  Hip flexion 4-/5 4/5  Hip extension    Hip abduction  Hip adduction    Hip internal rotation    Hip external rotation    Knee flexion 5/5 5/5  Knee extension 4+/5 4+/5  Ankle dorsiflexion 4/5 5/5  Ankle plantarflexion    Ankle inversion 4/5 5/5  Ankle eversion 4/5 5/5   (Blank rows = not tested)  GAIT: Assistive device utilized: None Level of assistance: Complete Independence                                                                                                            TREATMENT DATE:                                     11/24/23 EXERCISE LOG  Exercise Repetitions and Resistance Comments  Nustep  L5 x 15 minutes    Marching on foam  3.5 minutes w/ 5 second hold   Static stance on foam  3 minutes Without external support   Rocker board  5 minutes  1 HHA   Standing gastroc stretch  2 minutes   Lunges onto step  14 step x 3 minutes    Blank cell = exercise not performed today                                    11/10/23 EXERCISE LOG  Exercise Repetitions and Resistance Comments  Nustep  L5 x 15 minutes   Resisted PF  Green t-band x 1.5 minutes Blue t-band x 1.5 minutes   Standing gastroc stretch  2.5 minutes   Standing heel raise 2.5 minutes   Dynadisc seated  3  minutes  Circles       Blank cell = exercise not performed today                                    11/07/23 EXERCISE LOG  Exercise Repetitions and Resistance Comments  Nustep  L4-5 x 15 minutes   Rocker board  5 minutes   Goal assessment     Step up  6 step x 20 reps  RLE leading   Standing gastroc stretch  4 x 30 seconds    Blank cell = exercise not performed today   PATIENT EDUCATION:  Education details: POC Person educated: Patient Education method: Explanation Education comprehension: verbalized understanding  HOME EXERCISE PROGRAM:   ASSESSMENT:  CLINICAL IMPRESSION: Today's treatment focused on familiar interventions for improved lower extremity stability on unstable terrain. He required minimal cueing with today's interventions for proper exercise performance. He experienced no increase in pain or discomfort with any of today's interventions. He reported feeling fine upon the conclusion of treatment. He continues to require skilled physical therapy to address his remaining impairments to return to his prior level of function.     OBJECTIVE IMPAIRMENTS: decreased activity tolerance, decreased balance, decreased mobility, difficulty walking,  decreased ROM, decreased strength, and hypomobility.   ACTIVITY LIMITATIONS: locomotion level  PARTICIPATION LIMITATIONS: community activity  PERSONAL FACTORS: Past/current experiences, Time since onset of injury/illness/exacerbation, Transportation, and 3+ comorbidities: Atrial fibrillation, congestive heart failure, hypertension, COPD, diabetes type 1, history of cancer, history of a CVA, and glaucoma are also affecting patient's functional outcome.   REHAB POTENTIAL: Good  CLINICAL DECISION MAKING: Evolving/moderate complexity  EVALUATION COMPLEXITY: Moderate   GOALS: Goals reviewed with patient? Yes  LONG TERM GOALS: Target date: 11/14/23  Patient will be independent with his HEP. Baseline:  Goal status: MET  2.   Patient will improve his right ankle dorsiflexion by at least 6 degrees for improved gait mechanics. Baseline:  Goal status: INITIAL  3.  Patient will report being able to walk at least half a mile to return to his normal community activities. Baseline: at least 0.5 miles Goal status: MET  4.  Patient will improve his LEFS score to at least 67/80 for improved perceived function with his daily activities. Baseline:  Goal status: INITIAL   PLAN:  PT FREQUENCY: 2x/week  PT DURATION: 4 weeks  PLANNED INTERVENTIONS: 02835- PT Re-evaluation, 97750- Physical Performance Testing, 97110-Therapeutic exercises, 97530- Therapeutic activity, [id number]- Neuromuscular re-education, 97535- Self Care, 02859- Manual therapy, [phone]- Gait training, [phone]- Electrical stimulation (unattended), 97016- Vasopneumatic device, Patient/Family education, Balance training, Stair training, Joint mobilization, Cryotherapy, and Moist heat  PLAN FOR NEXT SESSION: NuStep, manual therapy, lower extremity strengthening, and modalities as needed   Lacinda JAYSON Fass, PT 11/24/2023, 11:12 AM

## 2023-11-25 LAB — BASIC METABOLIC PANEL WITH GFR
BUN/Creatinine Ratio: 12 (ref 10–24)
BUN: 18 mg/dL (ref 8–27)
CO2: 22 mmol/L (ref 20–29)
Calcium: 8.7 mg/dL (ref 8.6–10.2)
Chloride: 101 mmol/L (ref 96–106)
Creatinine, Ser: 1.53 mg/dL — ABNORMAL HIGH (ref 0.76–1.27)
Glucose: 300 mg/dL — ABNORMAL HIGH (ref 70–99)
Potassium: 3.8 mmol/L (ref 3.5–5.2)
Sodium: 139 mmol/L (ref 134–144)
eGFR: 48 mL/min/1.73 — ABNORMAL LOW (ref >59)

## 2023-11-27 ENCOUNTER — Other Ambulatory Visit (HOSPITAL_COMMUNITY): Payer: Self-pay

## 2023-11-27 ENCOUNTER — Ambulatory Visit (HOSPITAL_COMMUNITY): Payer: Self-pay | Admitting: Family Medicine

## 2023-11-27 DIAGNOSIS — I5022 Chronic systolic (congestive) heart failure: Secondary | ICD-10-CM

## 2023-11-27 NOTE — Progress Notes (Signed)
 11/28/2023 Name: Greg Robertson MRN: [id number] DOB: April 14, 1951  Chief Complaint  Patient presents with   Diabetes   Greg Robertson is a 73 y.o. year old male who presented for a follow-up visit.    They were referred to the pharmacist by their PCP for assistance in managing diabetes and medication access.   Subjective: Reports that he received his Libre reader and sensors delivered to his house, but he had to pay around $300. Patient has been able to apply his sensors and set up his reader at home.   For his meal time injections, he has been taking Novolog  5 minutes prior to each meal, but he sometimes will inject after meals if he forgot to take it beforehand. States that he has been eating upwards of 60 grams of carbohydrates with each meal. He is using 1 unit of insulin  for 6 grams of carbohydrates. Since the last visit, he has worked on cutting out apple juice with his breakfast.   Patient has self-reduced to 20 units of Tresiba  ~3 days ago due to fear of overnight lows.   Receives bubble packs from [employer].   Recently seen by [facility] on August 1st, 2025, where his blood pressure was 80/44. The nephrologist recommended holding both the furosemide  and eplerenone , but continue taking Entresto . Patient has a blood pressure reader at home and has been consistent with taking his pressures. He does not have his readings with him today  Care Team: Primary Care Provider: Dettinger, Fonda LABOR, MD next appt: 12/21/2023  Medication Access/Adherence  Current Pharmacy:  [facility] - [place], [place] - [address] [place] [address] Phone: [phone] Fax: [phone]  Greg Robertson, [place] - [address] [place] [address] Phone: [phone] Fax: [phone]  Patient reports affordability concerns with their medications: No  Patient reports access/transportation concerns to their pharmacy: No   Patient reports adherence concerns with their medications:  No    Diabetes:  Current medications:  Tresiba  20 units daily (patient self-reduced from 22 units due to fear of overnight lows)  Novolog  20-30 units 3 times daily Taking 1 units of Novolog  per 6 g of carbs  For every 50 pts over 100 --adds 1 additional unit Medications tried in the past: various different brand name insulins Statin: atorvastatin  40 mg daily ARNI: Entresto  24/26  A1c 9% on 09/18/23, decreased from 9.9% on 10/27/22  Glucometer: Relion   CGM: Libre 3+ Date of Download: 11/28/2023 % Time CGM is active: 96% Average Glucose:  7 days: 176  mg/dL 14 days: 774 mg/dL Time in Goal: SEVEN DAYS - Time in range 70-180: 60%  - Time above range: 39%   - Time below range: 1%  Time in Goal: FOURTEEN DAYS - Time in range 70-180: 37%  - Time above range: 62%  - Time below range: 1%  Observed patterns: low sugars observed overnight, with peaks around noon (after lunch); in the past 7 days, he has had 2 events of hypoglycemia   Patient denies hypoglycemic s/sx including dizziness, shakiness, sweating. Patient denies hyperglycemic symptoms including polyuria, polydipsia, polyphagia, nocturia, neuropathy, blurred vision.  Current meal patterns:  Reports 3 meals day; doesn't miss a meal ~60 grams of carbohydrates per meals Quit drinking apple juice in the mornings (but has kept juice around for low blood sugars, reports having to drink two cups of juice)  Current physical activity: as able   Current medication access  support:  [facility] Medicare 10/19/23 - sent in [place] 3+ to [facility] via [employer] Patient has received his reader and sensors, however he was charged ~$300 for the delivery.   Objective: Lab Results  Component Value Date   HGBA1C 9.0 (H) 09/20/2023   Lab Results  Component Value Date   CREATININE 1.53 (H) 11/24/2023   BUN 18 11/24/2023   NA 139 11/24/2023   K 3.8 11/24/2023   CL  101 11/24/2023   CO2 22 11/24/2023   Lab Results  Component Value Date   CHOL 131 09/20/2023   HDL 48 09/20/2023   LDLCALC 65 09/20/2023   TRIG 96 09/20/2023   CHOLHDL 2.7 09/20/2023   Medications Reviewed Today     Reviewed by Billee Mliss BIRCH, [facility] (Pharmacist) on 11/28/23 at 1134  Med List Status: <None>   Medication Order Taking? Sig Documenting Provider Last Dose Status Informant  Amino Acids-Protein Hydrolys (PRO-STAT) LIQD [id number]  Take 8 mLs by mouth daily. [provider]  Active   atorvastatin  (LIPITOR) 40 MG tablet [id number]  Take 1 tablet (40 mg total) by mouth daily. Rolan Ezra RAMAN, MD  Active   carvedilol  (COREG ) 12.5 MG tablet [id number]  Take 1 tablet (12.5 mg total) by mouth 2 (two) times daily with a meal. Dose change-updated dispill Rolan Ezra RAMAN, MD  Active   Cholecalciferol  50 MCG (2000 UT) TABS [id number]  Take 2 tablets by mouth daily. [provider]  Active Self, Pharmacy Records  Continuous Glucose Receiver (FREESTYLE LIBRE 3 READER) DEVI [id number] Yes 1 each by Does not apply route 4 (four) times daily. Dettinger, Fonda LABOR, MD  Active   Continuous Glucose Sensor (FREESTYLE LIBRE 3 PLUS SENSOR) [place] [id number] Yes Change sensor every 15 days. Dettinger, Fonda LABOR, MD  Active   eplerenone  (INSPRA ) 25 MG tablet [id number]  Take 1 tablet (25 mg total) by mouth daily. Rolan Ezra RAMAN, MD  Active   furosemide  (LASIX ) 40 MG tablet [id number]  Take 1 tablet (40 mg total) by mouth as needed. Rolan Ezra RAMAN, MD  Active   GARLIC PO [id number]  Take 1 capsule by mouth daily. [provider]  Active Self, Pharmacy Records  glucose blood test strip [id number]  Use as instructed Ricky Fines, MD  Active Self, Pharmacy Records  insulin  aspart (NOVOLOG  FLEXPEN) 100 UNIT/ML FlexPen [id number]  Inject 20-30 Units into the skin 3 (three) times daily with meals. Dettinger, Fonda LABOR, MD  Active   insulin  degludec (TRESIBA  FLEXTOUCH) 100 UNIT/ML FlexTouch  Pen [id number]  Inject 25 Units into the skin daily. Dettinger, Fonda LABOR, MD  Active   Insulin  Pen Needle (SURE COMFORT PEN NEEDLES) 32G X 4 MM MISC [id number]  UAD with insulin  DX E10.9 Dettinger, Fonda LABOR, MD  Active   Lancets MISC [id number]  1 Lancet by Does not apply route 3 (three) times daily. Dettinger, Fonda LABOR, MD  Active Self, Pharmacy Records  levocetirizine (XYZAL ) 5 MG tablet [id number]  TAKE ONE TABLET BY MOUTH ONCE DAILY. Dettinger, Fonda LABOR, MD  Active   Multiple Vitamin (MULTIVITAMIN WITH MINERALS) TABS tablet [id number]  Take 1 tablet by mouth daily. (0800) [provider]  Active Self, Pharmacy Records  rivaroxaban  (XARELTO ) 20 MG TABS tablet [id number]  TAKE ONE TABLET BY MOUTH EVERY EVENING WITH SUPPER McLean, Dalton S, MD  Active   ROCKLATAN  0.02-0.005 % SOLN [id number]  Apply 0.5 drops to eye at bedtime. [provider]  Active Self, Pharmacy Records  sacubitril -valsartan  (ENTRESTO )  24-26 MG [id number]  Take 1 tablet by mouth 2 (two) times daily. Rolan Ezra RAMAN, MD  Active   sodium zirconium cyclosilicate  (LOKELMA ) 10 g PACK packet [id number]  Take 10 g by mouth daily. Rolan Ezra RAMAN, MD  Active   triamcinolone  (NASACORT ) 55 MCG/ACT AERO nasal inhaler [id number]  INSTILL 1 SPRAY IN EACH NOSTRIL ONCE OR TWICE DAILY AS NEEDED. Georgina Nancyann ORN, MD  Active Self, Pharmacy Records           Med Note RANELLE, KEENE HERO   Fri Dec 17, 2021  2:06 PM)    CARMEL TO LARA [id number]  Med Name: Pro-stat sugar free 32oz  Give (80) ML by Mouth Twice a day [provider]  Active   vitamin C  (ASCORBIC ACID ) 500 MG tablet [id number]  Take 500 mg by mouth in the morning. (0800) [provider]  Active Self, Pharmacy Records            Assessment/Plan:   Diabetes: Currently uncontrolled Reviewed long term cardiovascular and renal outcomes of uncontrolled blood sugar Reviewed goal A1c, goal fasting, and goal 2 hour post prandial glucose Recommend to: Take  Novolog  20-30 units 15 minutes prior to meals Continue Tresiba  20 units daily CONTINUE to use libre 3 PLUS CGM -- additional samples given  Recommend to check glucose daily (fasting) or if symptomatic Counseled on difference between blood and fluid blood glucose, as well as the 15-20 minute delay that may occur with CGM devices and finger-stick readings Educated patient on taking Novolog  10-15 minutes prior to meals, rather than continuing to take bolus 5 minutes prior   Follow Up Plan: 1 week (8/19)  Woodie Jock, PharmD PGY1 Pharmacy Resident    Mliss Tarry Griffin, PharmD, BCACP, CPP Clinical Pharmacist, [facility]

## 2023-11-28 ENCOUNTER — Ambulatory Visit: Admitting: Pharmacist

## 2023-11-28 DIAGNOSIS — E109 Type 1 diabetes mellitus without complications: Secondary | ICD-10-CM | POA: Diagnosis not present

## 2023-12-01 ENCOUNTER — Ambulatory Visit

## 2023-12-01 DIAGNOSIS — M25671 Stiffness of right ankle, not elsewhere classified: Secondary | ICD-10-CM | POA: Diagnosis not present

## 2023-12-01 DIAGNOSIS — M6281 Muscle weakness (generalized): Secondary | ICD-10-CM

## 2023-12-01 NOTE — Therapy (Signed)
 OUTPATIENT PHYSICAL THERAPY LOWER EXTREMITY TREATMENT   Patient Name: Greg Robertson MRN: [id number] DOB:July 16, 1950, 73 y.o., male Today's Date: 12/01/2023  END OF SESSION:  PT End of Session - 12/01/23 1017     Visit Number 8    Number of Visits 8    Date for PT Re-Evaluation 01/12/24    PT Start Time 1014    PT Stop Time 1053    PT Time Calculation (min) 39 min    Activity Tolerance Patient tolerated treatment well    Behavior During Therapy [facility] for tasks assessed/performed                Past Medical History:  Diagnosis Date   Allergic rhinitis    Anemia    Atrial fibrillation (HCC)    Persistent   Cancer of ascending colon, 7cm 05/25/2011   Congestive heart failure (CHF) (HCC)    Diabetes mellitus type I (HCC)    Glaucoma    Hypertension    Pneumonia 1979 or 1980   Prostate nodule    Substance abuse (HCC)    Past Surgical History:  Procedure Laterality Date   broken left shoulder blade and collar bone     COLONOSCOPY WITH PROPOFOL  N/A 04/29/2021   Procedure: COLONOSCOPY WITH PROPOFOL ;  Surgeon: Teressa Toribio SQUIBB, MD;  Location: [facility];  Service: Endoscopy;  Laterality: N/A;   ESOPHAGOGASTRODUODENOSCOPY (EGD) WITH PROPOFOL  N/A 04/29/2021   Procedure: ESOPHAGOGASTRODUODENOSCOPY (EGD) WITH PROPOFOL ;  Surgeon: Teressa Toribio SQUIBB, MD;  Location: [facility];  Service: Endoscopy;  Laterality: N/A;   FINGER SURGERY  2009   right middle   HEMOSTASIS CLIP PLACEMENT  04/29/2021   Procedure: HEMOSTASIS CLIP PLACEMENT;  Surgeon: Teressa Toribio SQUIBB, MD;  Location: [facility];  Service: Endoscopy;;   LAPAROSCOPIC ASSISTED 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     NASAL FRACTURE SURGERY  1968   ORIF ANKLE FRACTURE Right 12/08/2022   Procedure: OPEN REDUCTION INTERNAL FIXATION (ORIF) ANKLE FRACTURE;  Surgeon: Georgina Ozell LABOR, MD;  Location: [facility];  Service: Orthopedics;  Laterality: Right;   POLYPECTOMY  04/29/2021   Procedure: POLYPECTOMY;  Surgeon: Teressa Toribio SQUIBB, MD;  Location: [facility];  Service: Endoscopy;;   PORTACATH PLACEMENT  07/01/2011   Procedure: INSERTION PORT-A-CATH;  Surgeon: Elspeth KYM Schultze, MD;  Location: [facility];  Service: General;  Laterality: Left;  Insertion of Port-A-Catheter Left Internal Jugular   RIGHT/LEFT HEART CATH AND CORONARY ANGIOGRAPHY N/A 12/08/2020   Procedure: RIGHT/LEFT HEART CATH AND CORONARY ANGIOGRAPHY;  Surgeon: Rolan Ezra RAMAN, MD;  Location: [facility] INVASIVE CV LAB;  Service: Cardiovascular;  Laterality: N/A;   TONSILLECTOMY  1957 - approximate   Patient Active Problem List   Diagnosis Date Noted   Closed fracture dislocation of right ankle joint 12/08/2022   Change in bowel habits 10/29/2022   Chronic anticoagulation 10/29/2022   History of colectomy 10/29/2022   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   Mixed hyperlipidemia 08/22/2022   Chronic insomnia 11/24/2021   Retinopathy 11/24/2021   Central retinal vein occlusion, right eye, stable 10/25/2021   History of CVA (cerebrovascular accident) 09/27/2020   A-fib (HCC) 09/27/2020   Left homonymous hemianopsia 09/27/2020   COPD (chronic obstructive pulmonary disease) (HCC) 09/05/2017   Aortic atherosclerosis (HCC) 09/04/2017   Benign prostatic hyperplasia 09/04/2017   Hypertension, essential 03/14/2012   Chronic CHF (congestive heart failure) (HCC) 03/12/2012   Lynch syndrome 07/28/2011   History of colon cancer 05/25/2011   DM type 1 (diabetes mellitus, type 1) (HCC) 05/10/2011  Glaucoma 05/10/2011   REFERRING PROVIDER: Harden Jerona GAILS, MD   REFERRING DIAG: Closed fracture dislocation of right ankle, initial encounter, Chronic venous hypertension (idiopathic) with ulcer and inflammation of right lower extremity   THERAPY DIAG:  Stiffness of right ankle, not elsewhere classified  Muscle weakness (generalized)  Rationale for Evaluation and Treatment: Rehabilitation  ONSET DATE: August 2024  SUBJECTIVE:   SUBJECTIVE STATEMENT: Patient reports that  he feels alright today. He still notes that his blood pressure is low.   PERTINENT HISTORY: Atrial fibrillation, congestive heart failure, hypertension, COPD, diabetes type 1, history of cancer, history of a CVA, and glaucoma PAIN:  Are you having pain? No  PRECAUTIONS: None  RED FLAGS: None   WEIGHT BEARING RESTRICTIONS: No  FALLS:  Has patient fallen in last 6 months? No  LIVING ENVIRONMENT: Lives with: lives alone, but has an aide that comes in twice per week Lives in: House/apartment Stairs: Yes: Internal: 14 steps; on right going up and External: 3 steps; none Has following equipment at home: None  OCCUPATION: retired  PLOF: Independent with basic ADLs  PATIENT GOALS: improved balance and strength  NEXT MD VISIT: 11/02/23  OBJECTIVE:  Note: Objective measures were completed at Evaluation unless otherwise noted.  DIAGNOSTIC FINDINGS: 03/13/23 right ankle x-ray Three-view radiographs of the right ankle shows stable internal fixation of the fibular fracture medial malleolus fracture and syndesmosis disruption.  Patient has calcification of the anterior tibial and dorsalis pedis artery.  PATIENT SURVEYS:  LEFS  Extreme difficulty/unable (0), Quite a bit of difficulty (1), Moderate difficulty (2), Little difficulty (3), No difficulty (4) Survey date:  10/17/23 11/07/23  Any of your usual work, housework or school activities 3 3  2. Usual hobbies, recreational or sporting activities 3 1  3. Getting into/out of the bath 4 3  4. Walking between rooms 4 4  5. Putting on socks/shoes 4 4  6. Squatting  3 3  7. Lifting an object, like a bag of groceries from the floor 3 3  8. Performing light activities around your home 3 3  9. Performing heavy activities around your home 2 2  10. Getting into/out of a car 3 3  11. Walking 2 blocks 3 2  12. Walking 1 mile 3 0  13. Going up/down 10 stairs (1 flight) 3 3  14. Standing for 1 hour 3 0  15.  sitting for 1 hour 4 4  16. Running  on even ground 1 0  17. Running on uneven ground 1 0  18. Making sharp turns while running fast 1 0  19. Hopping  1 0  20. Rolling over in bed 4 4  Score total:  56/80 42/80     COGNITION: Overall cognitive status: Within functional limits for tasks assessed     SENSATION: Patient reports no numbness or tingling  LOWER EXTREMITY ROM:  Active ROM Right eval Right 11/07/23 Left eval  Hip flexion     Hip extension     Hip abduction     Hip adduction     Hip internal rotation     Hip external rotation     Knee flexion     Knee extension     Ankle dorsiflexion -3 1 5   Ankle plantarflexion 27  29  Ankle inversion 12 12 23   Ankle eversion 7 20 16    (Blank rows = not tested)  LOWER EXTREMITY MMT:  MMT Right eval Left eval  Hip flexion 4-/5 4/5  Hip  extension    Hip abduction    Hip adduction    Hip internal rotation    Hip external rotation    Knee flexion 5/5 5/5  Knee extension 4+/5 4+/5  Ankle dorsiflexion 4/5 5/5  Ankle plantarflexion    Ankle inversion 4/5 5/5  Ankle eversion 4/5 5/5   (Blank rows = not tested)  GAIT: Assistive device utilized: None Level of assistance: Complete Independence                                                                                                            TREATMENT DATE:                                     12/01/23 EXERCISE LOG  Exercise Repetitions and Resistance Comments  Nustep  L6 x 15 minutes   Goal assessment See below                Blank cell = exercise not performed today                                    11/24/23 EXERCISE LOG  Exercise Repetitions and Resistance Comments  Nustep  L5 x 15 minutes    Marching on foam  3.5 minutes w/ 5 second hold   Static stance on foam  3 minutes Without external support   Rocker board  5 minutes  1 HHA   Standing gastroc stretch  2 minutes   Lunges onto step  14 step x 3 minutes    Blank cell = exercise not performed today                                     11/10/23 EXERCISE LOG  Exercise Repetitions and Resistance Comments  Nustep  L5 x 15 minutes   Resisted PF  Green t-band x 1.5 minutes Blue t-band x 1.5 minutes   Standing gastroc stretch  2.5 minutes   Standing heel raise 2.5 minutes   Dynadisc seated  3 minutes  Circles       Blank cell = exercise not performed today   PATIENT EDUCATION:  Education details: progress with physical therapy Person educated: Patient Education method: Explanation Education comprehension: verbalized understanding  HOME EXERCISE PROGRAM:   ASSESSMENT:  CLINICAL IMPRESSION: Patient has made good progress with skilled physical therapy as he was able to meet most of his goals for skilled physical therapy. He was able to demonstrate an eight degree improvement in right ankle dorsiflexion since his initial evaluation. He was unable to meet his LEFS goal, but this could partially be attributed to his recent modifications to his blood pressure medications which have altered his functional mobility. He felt comfortable being discharged at this time with his current HEP.   PHYSICAL THERAPY DISCHARGE SUMMARY  Visits from Start of Care: 8  Current functional level related to goals / functional outcomes: Patient was able to meet most of his goals with skilled physical therapy.    Remaining deficits: None   Education / Equipment: HEP   Patient agrees to discharge. Patient goals were partially met. Patient is being discharged due to being pleased with the current functional level.   OBJECTIVE IMPAIRMENTS: decreased activity tolerance, decreased balance, decreased mobility, difficulty walking, decreased ROM, decreased strength, and hypomobility.   ACTIVITY LIMITATIONS: locomotion level  PARTICIPATION LIMITATIONS: community activity  PERSONAL FACTORS: Past/current experiences, Time since onset of injury/illness/exacerbation, Transportation, and 3+ comorbidities: Atrial fibrillation, congestive heart failure,  hypertension, COPD, diabetes type 1, history of cancer, history of a CVA, and glaucoma are also affecting patient's functional outcome.   REHAB POTENTIAL: Good  CLINICAL DECISION MAKING: Evolving/moderate complexity  EVALUATION COMPLEXITY: Moderate   GOALS: Goals reviewed with patient? Yes  LONG TERM GOALS: Target date: 11/14/23  Patient will be independent with his HEP. Baseline:  Goal status: MET  2.  Patient will improve his right ankle dorsiflexion by at least 6 degrees for improved gait mechanics. Baseline: 8 degree improvement from initial evaluation to 12/01/23 Goal status: MET  3.  Patient will report being able to walk at least half a mile to return to his normal community activities. Baseline: at least 0.5 miles Goal status: MET  4.  Patient will improve his LEFS score to at least 67/80 for improved perceived function with his daily activities. Baseline: 54/80 on 12/01/23  Goal status: NOT MET   PLAN:  PT FREQUENCY: 2x/week  PT DURATION: 4 weeks  PLANNED INTERVENTIONS: 97164- PT Re-evaluation, 97750- Physical Performance Testing, 97110-Therapeutic exercises, 97530- Therapeutic activity, 97112- Neuromuscular re-education, 97535- Self Care, 02859- Manual therapy, [phone]- Gait training, [phone]- Electrical stimulation (unattended), 97016- Vasopneumatic device, Patient/Family education, Balance training, Stair training, Joint mobilization, Cryotherapy, and Moist heat  PLAN FOR NEXT SESSION: NuStep, manual therapy, lower extremity strengthening, and modalities as needed   Lacinda JAYSON Fass, PT 12/01/2023, 12:26 PM

## 2023-12-04 NOTE — Progress Notes (Unsigned)
 12/05/2023 Name: Greg Robertson MRN: [id number] DOB: 1950/07/30  Chief Complaint  Patient presents with   Diabetes   JOSS MCDILL is a 73 y.o. year old male who presented for a follow-up visit.    They were referred to the pharmacist by their PCP for assistance in managing diabetes and medication access.   Subjective: At the time of the visit, patient experiencing hypoglycemia Libre meter of 58 mg/dL. On re-check with glucometer, 83 mg/dL after drinking 8 oz of grape juice. Denies having symptoms of hypoglycemia this morning, typically only feels low blood sugars when <50 mg/dL.   Injects Tresiba  every morning, in the abdomen. Patient is typically taking 10 units per meal, for a total of 20-30 units per day. Prior to this appointment, he has 1 cup of raisin brand, and took 8 units of Novolog . Since the last visit, the patient endorses taking his insulin  15 minutes prior to meals. He is rotating injection sites.   When asked about transitioning to an insulin  pump, patient states that he is not interested at this time and would rather continue insulin  injections.   Care Team: Primary Care Provider: Dettinger, Fonda LABOR, MD next appt: 12/21/2023  Medication Access/Adherence  Current Pharmacy:  [facility] - [place], [place] - [address] [place] [address] Phone: [phone] Fax: [phone]  [facility] [place], [place] - [address] [place] [address] Phone: [phone] Fax: [phone]  Patient reports affordability concerns with their medications: No  Patient reports access/transportation concerns to their pharmacy: No  Patient reports adherence concerns with their medications:  No    Diabetes:  Current medications:  Tresiba  18 units daily  8/19: Patient self-reduced to 18 units  8/12: Patient self-reduced to 20 units from 22 units due to fear of overnight lows Novolog   Taking 1 units of Novolog  per 6 g  of carbs  Typically taking ~10 units with meals For every 50 pts over 100 -- adds 1 additional unit Medications tried in the past: various different brand name insulins Statin: atorvastatin  40 mg daily ARNI: Entresto  24/26  A1c 9% on 09/18/23, decreased from 9.9% on 10/27/22  Glucometer: Relion   Glucose reading (8/19): Libre 3+: 58 mg/dL On re-check with glucometer: 83 mg/dL (after 8 oz of juice)   CGM: Libre 3+ (using reader as device) Date of Download: 12/05/23 % Time CGM is active: 97% Average Glucose:  7 days: 220 mg/dL 14 days: 802 mg/dL Time in Goal: SEVEN DAYS - Time in range 70-180: 40%  - Time above range: 58%   - Time below range: 2%  Time in Goal: FOURTEEN DAYS - Time in range 70-180: 50%  - Time above range: 48%  - Time below range: 2%  Observed patterns: Two hypoglycemic events in the past week, both of which where in the afternoon and evening. No clear trends, glucose variability 44.9%.   Patient denies hypoglycemic s/sx including dizziness, shakiness, sweating. Patient denies hyperglycemic symptoms including polyuria, polydipsia, polyphagia, nocturia, neuropathy, blurred vision.  Current meal patterns:  Reports 3 meals day; doesn't miss a meal ~60 grams of carbohydrates per meals Quit drinking apple juice in the mornings (but has kept juice around for low blood sugars, reports having to drink two cups of juice)    Current physical activity: as able   Current medication access support:  [facility] Medicare 10/19/23 - sent in [place] 3+ to [facility] via [employer] Patient has received  his reader and sensors, however he was charged ~$300 for the delivery.   Objective: Lab Results  Component Value Date   HGBA1C 9.0 (H) 09/20/2023   Lab Results  Component Value Date   CREATININE 1.53 (H) 11/24/2023   BUN 18 11/24/2023   NA 139 11/24/2023   K 3.8 11/24/2023   CL 101 11/24/2023   CO2 22 11/24/2023   Lab Results  Component Value Date    CHOL 131 09/20/2023   HDL 48 09/20/2023   LDLCALC 65 09/20/2023   TRIG 96 09/20/2023   CHOLHDL 2.7 09/20/2023   Medications Reviewed Today     Reviewed by Bernette Falling, [facility] (Pharmacist) on 12/05/23 at 1213  Med List Status: <None>   Medication Order Taking? Sig Documenting Provider Last Dose Status Informant  Amino Acids-Protein Hydrolys (PRO-STAT) LIQD [id number]  Take 8 mLs by mouth daily. [provider]  Active   atorvastatin  (LIPITOR) 40 MG tablet [id number]  Take 1 tablet (40 mg total) by mouth daily. Rolan Ezra RAMAN, MD  Active   carvedilol  (COREG ) 12.5 MG tablet [id number]  Take 1 tablet (12.5 mg total) by mouth 2 (two) times daily with a meal. Dose change-updated dispill Rolan Ezra RAMAN, MD  Active   Cholecalciferol  50 MCG (2000 UT) TABS [id number]  Take 2 tablets by mouth daily. [provider]  Active Self, Pharmacy Records  Continuous Glucose Receiver (FREESTYLE LIBRE 3 READER) DEVI [id number] Yes 1 each by Does not apply route 4 (four) times daily. Dettinger, Fonda LABOR, MD  Active   Continuous Glucose Sensor (FREESTYLE LIBRE 3 PLUS SENSOR) [place] [id number] Yes Change sensor every 15 days. Dettinger, Fonda LABOR, MD  Active   eplerenone  (INSPRA ) 25 MG tablet [id number]  Take 1 tablet (25 mg total) by mouth daily.  Patient not taking: Reported on 11/28/2023   Rolan Ezra RAMAN, MD  Active   furosemide  (LASIX ) 40 MG tablet [id number]  Take 1 tablet (40 mg total) by mouth as needed.  Patient not taking: Reported on 11/28/2023   Rolan Ezra RAMAN, MD  Active   GARLIC PO [id number]  Take 1 capsule by mouth daily. [provider]  Active Self, Pharmacy Records  glucose blood test strip [id number]  Use as instructed Ricky Fines, MD  Active Self, Pharmacy Records  insulin  aspart (NOVOLOG  FLEXPEN) 100 UNIT/ML FlexPen [id number] Yes Inject 20-30 Units into the skin 3 (three) times daily with meals.  Patient taking differently: Inject 8-12 Units into the skin 3 (three) times  daily with meals.   Dettinger, Fonda LABOR, MD  Active   insulin  degludec (TRESIBA  FLEXTOUCH) 100 UNIT/ML FlexTouch Pen [id number] Yes Inject 25 Units into the skin daily.  Patient taking differently: Inject 18 Units into the skin daily.   Dettinger, Fonda LABOR, MD  Active   Insulin  Pen Needle (SURE COMFORT PEN NEEDLES) 32G X 4 MM MISC [id number]  UAD with insulin  DX E10.9 Dettinger, Fonda LABOR, MD  Active   Lancets MISC [id number]  1 Lancet by Does not apply route 3 (three) times daily. Dettinger, Fonda LABOR, MD  Active Self, Pharmacy Records  levocetirizine (XYZAL ) 5 MG tablet [id number]  TAKE ONE TABLET BY MOUTH ONCE DAILY. Dettinger, Fonda LABOR, MD  Active   Multiple Vitamin (MULTIVITAMIN WITH MINERALS) TABS tablet [id number]  Take 1 tablet by mouth daily. (0800) [provider]  Active Self, Pharmacy Records  rivaroxaban  (XARELTO ) 20 MG TABS tablet [id number]  TAKE ONE TABLET BY MOUTH EVERY EVENING WITH SUPPER Rolan Ezra RAMAN, MD  Active   ROCKLATAN  0.02-0.005 % SOLN [id number]  Apply 0.5 drops to eye at bedtime. [provider]  Active Self, Pharmacy Records  sacubitril -valsartan  (ENTRESTO ) 24-26 MG [id number]  Take 1 tablet by mouth 2 (two) times daily. Rolan Ezra RAMAN, MD  Active   sodium zirconium cyclosilicate  (LOKELMA ) 10 g PACK packet [id number]  Take 10 g by mouth daily. Rolan Ezra RAMAN, MD  Active   triamcinolone  (NASACORT ) 55 MCG/ACT AERO nasal inhaler [id number]  INSTILL 1 SPRAY IN EACH NOSTRIL ONCE OR TWICE DAILY AS NEEDED. Georgina Nancyann ORN, MD  Active Self, Pharmacy Records           Med Note RANELLE, KEENE HERO   Fri Dec 17, 2021  2:06 PM)    CARMEL TO LARA [id number]  Med Name: Pro-stat sugar free 32oz  Give (80) ML by Mouth Twice a day [provider]  Active   vitamin C  (ASCORBIC ACID ) 500 MG tablet [id number]  Take 500 mg by mouth in the morning. (0800) [provider]  Active Self, Pharmacy Records            Assessment/Plan:   Diabetes: Currently  uncontrolled Reviewed long term cardiovascular and renal outcomes of uncontrolled blood sugar Reviewed goal A1c, goal fasting, and goal 2 hour post prandial glucose Recommend to: Take 10 units of Novolog  15 minutes prior to meals Temporarily stop carbohydrate counting due to concern for over correction and episodes of hypoglycemia, will follow-up in one week to assess new regimen.  Continue Tresiba  18 units daily Continue to use libre 3 PLUS CGM  Recommend to check glucose daily (fasting) or if symptomatic Counseled on difference between blood and fluid blood glucose, as well as the 15-20 minute delay that may occur with CGM devices and finger-stick readings Educated patient on taking Novolog  10-15 minutes prior to meals, rather than continuing to take bolus 5 minutes prior   Follow Up Plan: 1 week  Woodie Jock, PharmD PGY1 Pharmacy Resident    Mliss Tarry Griffin, PharmD, BCACP, CPP Clinical Pharmacist, [facility]

## 2023-12-05 ENCOUNTER — Ambulatory Visit (INDEPENDENT_AMBULATORY_CARE_PROVIDER_SITE_OTHER): Admitting: Pharmacist

## 2023-12-05 ENCOUNTER — Ambulatory Visit

## 2023-12-05 DIAGNOSIS — E1059 Type 1 diabetes mellitus with other circulatory complications: Secondary | ICD-10-CM | POA: Diagnosis not present

## 2023-12-11 NOTE — Progress Notes (Unsigned)
 12/12/2023 Name: Greg Robertson MRN: [id number] DOB: 1950-10-02  Chief Complaint  Patient presents with   Diabetes   Greg Robertson is a 73 y.o. year old male who presented for a follow-up visit.    They were referred to the pharmacist by their PCP for assistance in managing diabetes and medication access.   Subjective: Reports adherence with new regimen. States that he felt that he was not covered by the 10 units, especially with dinners and breakfast. He has adjusted his eating, trying to account for carbs while using 10 units.   Patient states that he had ~5 alerts in the past week due to his sugars being 75 mg/dL. When he got those alerts, he made sure to eat, preventing the readings from dipping below 70 mg/dL. Denies any symptomatic hypoglycemia events.   Care Team: Primary Care Provider: Dettinger, Fonda LABOR, MD next appt: 12/21/2023  Medication Access/Adherence  Current Pharmacy:  [facility] - [place], [place] - [address] [place] [address] Phone: [phone] Fax: [phone]  [facility] [place], [place] - [address] [place] [address] Phone: [phone] Fax: [phone]  Patient reports affordability concerns with their medications: No  Patient reports access/transportation concerns to their pharmacy: No  Patient reports adherence concerns with their medications:  No    Diabetes:  Current medications:  Tresiba  18 units daily  8/19: Patient self-reduced to 18 units  8/12: Patient self-reduced to 20 units from 22 units due to fear of overnight lows Novolog  10 units 15 minutes prior to meals  Prior Novolog  dose:  Taking 1 units of Novolog  per 6 g of carbs  Typically taking ~10 units with meals For every 50 pts over 100 -- adds 1 additional unit Medications tried in the past: various different brand name insulins Statin: atorvastatin  40 mg daily ARNI: Entresto  24/26  A1c 9% on 09/18/23,  decreased from 9.9% on 10/27/22  Glucometer: Relion  CGM: Libre 3+ (using reader as device) Date of Download: 12/12/2023 % Time CGM is active: 96% Average Glucose:  7 days: 220 mg/dL 14 days: 783 mg/dL Time in Goal: FOURTEEN DAYS - Time in range 70-180: 41% - Time above range: 58% - Time below range: 1%  - Glucose Variability: 44.1%  Observed patterns: Trends of afternoon highs after dinner     Patient denies hypoglycemic s/sx including dizziness, shakiness, sweating. Patient denies hyperglycemic symptoms including polyuria, polydipsia, polyphagia, nocturia, neuropathy, blurred vision.  Current meal patterns:  Reports 3 meals day; doesn't miss a meal ~60 grams of carbohydrates per meals Quit drinking apple juice in the mornings (but has kept juice around for low blood sugars, reports having to drink two cups of juice)  Current physical activity: as able   Current medication access support:  [facility] Medicare 10/19/23 - sent in [place] 3+ to [facility] via [employer] Patient has received his reader and sensors, however he was charged ~$300 for the delivery.   Objective: Lab Results  Component Value Date   HGBA1C 9.0 (H) 09/20/2023   Lab Results  Component Value Date   CREATININE 1.53 (H) 11/24/2023   BUN 18 11/24/2023   NA 139 11/24/2023   K 3.8 11/24/2023   CL 101 11/24/2023   CO2 22 11/24/2023   Lab Results  Component Value Date   CHOL 131 09/20/2023   HDL 48 09/20/2023   LDLCALC 65 09/20/2023   TRIG 96 09/20/2023   CHOLHDL 2.7 09/20/2023  Medications Reviewed Today     Reviewed by Bernette Falling, [facility] (Pharmacist) on 12/12/23 at 1218  Med List Status: <None>   Medication Order Taking? Sig Documenting Provider Last Dose Status Informant  Amino Acids-Protein Hydrolys (PRO-STAT) LIQD [id number] No Take 8 mLs by mouth daily. [provider] Taking Active   atorvastatin  (LIPITOR) 40 MG tablet [id number]  Take 1 tablet (40 mg total) by mouth  daily. Rolan Ezra RAMAN, MD  Active   carvedilol  (COREG ) 12.5 MG tablet [id number]  Take 1 tablet (12.5 mg total) by mouth 2 (two) times daily with a meal. Dose change-updated dispill Rolan Ezra RAMAN, MD  Active   Cholecalciferol  50 MCG (2000 UT) TABS [id number] No Take 2 tablets by mouth daily. [provider] Taking Active Self, Pharmacy Records  Continuous Glucose Receiver (FREESTYLE LIBRE 3 READER) DEVI [id number]  1 each by Does not apply route 4 (four) times daily. Dettinger, Fonda LABOR, MD  Active   Continuous Glucose Sensor (FREESTYLE LIBRE 3 PLUS SENSOR) [place] [id number]  Change sensor every 15 days. Dettinger, Fonda LABOR, MD  Active   eplerenone  (INSPRA ) 25 MG tablet [id number]  Take 1 tablet (25 mg total) by mouth daily.  Patient not taking: Reported on 11/28/2023   Rolan Ezra RAMAN, MD  Active   furosemide  (LASIX ) 40 MG tablet [id number]  Take 1 tablet (40 mg total) by mouth as needed.  Patient not taking: Reported on 11/28/2023   Rolan Ezra RAMAN, MD  Active   GARLIC PO [id number] No Take 1 capsule by mouth daily. [provider] Taking Active Self, Pharmacy Records  glucose blood test strip [id number] No Use as instructed Ricky Fines, MD Taking Active Self, Pharmacy Records  insulin  aspart (NOVOLOG  FLEXPEN) 100 UNIT/ML FlexPen [id number]  Inject 20-30 Units into the skin 3 (three) times daily with meals.  Patient taking differently: Inject 8-12 Units into the skin 3 (three) times daily with meals.   Dettinger, Fonda LABOR, MD  Active   insulin  degludec (TRESIBA  FLEXTOUCH) 100 UNIT/ML FlexTouch Pen [id number]  Inject 25 Units into the skin daily.  Patient taking differently: Inject 18 Units into the skin daily.   Dettinger, Fonda LABOR, MD  Active   Insulin  Pen Needle (SURE COMFORT PEN NEEDLES) 32G X 4 MM MISC [id number]  UAD with insulin  DX E10.9 Dettinger, Fonda LABOR, MD  Active   Lancets MISC [id number] No 1 Lancet by Does not apply route 3 (three) times daily. Dettinger, Fonda LABOR, MD  Taking Active Self, Pharmacy Records  levocetirizine (XYZAL ) 5 MG tablet [id number]  TAKE ONE TABLET BY MOUTH ONCE DAILY. Dettinger, Fonda LABOR, MD  Active   Multiple Vitamin (MULTIVITAMIN WITH MINERALS) TABS tablet [id number] No Take 1 tablet by mouth daily. (0800) [provider] Taking Active Self, Pharmacy Records  rivaroxaban  (XARELTO ) 20 MG TABS tablet [id number]  TAKE ONE TABLET BY MOUTH EVERY EVENING WITH SUPPER McLean, Dalton S, MD  Active   ROCKLATAN  0.02-0.005 % SOLN [id number] No Apply 0.5 drops to eye at bedtime. [provider] Taking Active Self, Pharmacy Records  sacubitril -valsartan  (ENTRESTO ) 24-26 MG [id number]  Take 1 tablet by mouth 2 (two) times daily. Rolan Ezra RAMAN, MD  Active   sodium zirconium cyclosilicate  (LOKELMA ) 10 g PACK packet [id number]  Take 10 g by mouth daily. Rolan Ezra RAMAN, MD  Active   triamcinolone  (NASACORT ) 55 MCG/ACT AERO nasal inhaler [id number] No INSTILL 1 SPRAY IN EACH NOSTRIL ONCE OR TWICE DAILY AS NEEDED. Georgina Nancyann ORN, MD Taking Active Self,  Pharmacy Records           Med Note RANELLE, KEENE HERO   Fri Dec 17, 2021  2:06 PM)    CARMEL TO FIND [id number] No Med Name: Pro-stat sugar free 32oz  Give (80) ML by Mouth Twice a day [provider] Taking Active   vitamin C  (ASCORBIC ACID ) 500 MG tablet [id number] No Take 500 mg by mouth in the morning. (0800) [provider] Taking Active Self, Pharmacy Records            Assessment/Plan:   Diabetes: Currently uncontrolled, but reduction in hypoglycemia events after transitioning to fixed meal-time insulin  dose  Reviewed long term cardiovascular and renal outcomes of uncontrolled blood sugar Reviewed goal A1c, goal fasting, and goal 2 hour post prandial glucose Recommend to: Continue 10 units of Novolog  15 minutes prior to breakfast and lunch Increase 12 units of Novolog  15 minutes prior to dinner Pattern of post-dinner highs, increasing bolus to account for largest  meal of the day Continue Tresiba  18 units daily Continue to use libre 3 PLUS CGM  Recommend to check glucose daily (fasting) or if symptomatic Counseled on difference between blood and fluid blood glucose, as well as the 15-20 minute delay that may occur with CGM devices and finger-stick readings   Follow Up Plan: 2 weeks  Woodie Jock, PharmD PGY1 Pharmacy Resident   Mliss Tarry Griffin, PharmD, BCACP, CPP Clinical Pharmacist, [facility]

## 2023-12-12 ENCOUNTER — Ambulatory Visit (INDEPENDENT_AMBULATORY_CARE_PROVIDER_SITE_OTHER): Admitting: Pharmacist

## 2023-12-12 DIAGNOSIS — E1059 Type 1 diabetes mellitus with other circulatory complications: Secondary | ICD-10-CM | POA: Diagnosis not present

## 2023-12-21 ENCOUNTER — Ambulatory Visit (INDEPENDENT_AMBULATORY_CARE_PROVIDER_SITE_OTHER): Admitting: Family Medicine

## 2023-12-21 ENCOUNTER — Encounter: Payer: Self-pay | Admitting: Family Medicine

## 2023-12-21 VITALS — BP 103/59 | HR 72 | Ht 74.0 in | Wt 192.0 lb

## 2023-12-21 DIAGNOSIS — E782 Mixed hyperlipidemia: Secondary | ICD-10-CM

## 2023-12-21 DIAGNOSIS — E1069 Type 1 diabetes mellitus with other specified complication: Secondary | ICD-10-CM | POA: Diagnosis not present

## 2023-12-21 DIAGNOSIS — Z23 Encounter for immunization: Secondary | ICD-10-CM

## 2023-12-21 DIAGNOSIS — E10319 Type 1 diabetes mellitus with unspecified diabetic retinopathy without macular edema: Secondary | ICD-10-CM | POA: Diagnosis not present

## 2023-12-21 DIAGNOSIS — I10 Essential (primary) hypertension: Secondary | ICD-10-CM | POA: Diagnosis not present

## 2023-12-21 DIAGNOSIS — Z794 Long term (current) use of insulin: Secondary | ICD-10-CM

## 2023-12-21 LAB — BAYER DCA HB A1C WAIVED: HB A1C (BAYER DCA - WAIVED): 8.4 % — ABNORMAL HIGH (ref 4.8–5.6)

## 2023-12-21 NOTE — Progress Notes (Addendum)
 BP (!) 103/59   Pulse 72   Ht 6' 2 (1.88 m)   Wt 192 lb (87.1 kg)   SpO2 98%   BMI 24.65 kg/m    Subjective:   Patient ID: Greg Robertson, male    DOB: 1951-01-25, 73 y.o.   MRN: [id number]  HPI: Greg Robertson is a 73 y.o. male presenting on 12/21/2023 for Medical Management of Chronic Issues and Diabetes   Discussed the use of AI scribe software for clinical note transcription with the patient, who gave verbal consent to proceed.  History of Present Illness   Greg Robertson is a 73 year old male with diabetes who presents for a recheck of his blood sugar management.  He has been experiencing low blood sugar levels, particularly before giving blood, which requires him to consume grape juice to stabilize them. His blood sugar dropped to about 65 but has since increased to approximately 83. No symptoms of hypoglycemia unless it drops to the fifties or forties.  He is on a regimen of Novolog  and Tresiba  for diabetes management. He takes 10 units of Novolog  before breakfast and lunch, and 12 units before dinner. He also takes 18 units of Tresiba  daily. His average blood sugar levels have been improving since adjustments were made two weeks ago by Mliss, whom he sees weekly. Previously, he was advised to take 25 units of Tresiba , but he reduced it to 18 units due to low blood sugar levels at night.  He resides in an assisted living facility. He reports a history of a previous ankle fracture, which he states causes his right leg to stay a little swollen.          Relevant past medical, surgical, family and social history reviewed and updated as indicated. Interim medical history since our last visit reviewed. Allergies and medications reviewed and updated.  Review of Systems  Constitutional:  Negative for chills and fever.  Eyes:  Negative for visual disturbance.  Respiratory:  Negative for shortness of breath and wheezing.   Cardiovascular:  Negative for chest  pain and leg swelling.  Skin:  Negative for rash.  Neurological:  Negative for dizziness and numbness.  All other systems reviewed and are negative.   Per HPI unless specifically indicated above   Allergies as of 12/21/2023       Reactions   Oxycodone  Nausea And Vomiting        Medication List        Accurate as of December 21, 2023 11:21 AM. If you have any questions, ask your nurse or doctor.          ascorbic acid  500 MG tablet Commonly known as: VITAMIN C  Take 500 mg by mouth in the morning. (0800)   atorvastatin  40 MG tablet Commonly known as: LIPITOR Take 1 tablet (40 mg total) by mouth daily.   carvedilol  12.5 MG tablet Commonly known as: COREG  Take 1 tablet (12.5 mg total) by mouth 2 (two) times daily with a meal. Dose change-updated dispill   Cholecalciferol  50 MCG (2000 UT) Tabs Take 2 tablets by mouth daily.   Entresto  24-26 MG Generic drug: sacubitril -valsartan  Take 1 tablet by mouth 2 (two) times daily.   eplerenone  25 MG tablet Commonly known as: INSPRA  Take 1 tablet (25 mg total) by mouth daily.   FreeStyle Libre 3 Plus Sensor Misc Change sensor every 15 days.   FreeStyle Libre 3 Reader Devi 1 each by Does not apply route 4 (four)  times daily.   furosemide  40 MG tablet Commonly known as: LASIX  Take 1 tablet (40 mg total) by mouth as needed.   GARLIC PO Take 1 capsule by mouth daily.   glucose blood test strip Use as instructed   Lancets Misc 1 Lancet by Does not apply route 3 (three) times daily.   levocetirizine 5 MG tablet Commonly known as: XYZAL  TAKE ONE TABLET BY MOUTH ONCE DAILY.   Lokelma  10 g Pack packet Generic drug: sodium zirconium cyclosilicate  Take 10 g by mouth daily.   multivitamin with minerals Tabs tablet Take 1 tablet by mouth daily. (0800)   NovoLOG  FlexPen 100 UNIT/ML FlexPen Generic drug: insulin  aspart Inject 20-30 Units into the skin 3 (three) times daily with meals. What changed: how much to take    Pro-Stat Liqd Take 8 mLs by mouth daily.   rivaroxaban  20 MG Tabs tablet Commonly known as: Xarelto  TAKE ONE TABLET BY MOUTH EVERY EVENING WITH SUPPER   Rocklatan  0.02-0.005 % Soln Generic drug: Netarsudil -Latanoprost  Apply 0.5 drops to eye at bedtime.   Sure Comfort Pen Needles 32G X 4 MM Misc Generic drug: Insulin  Pen Needle UAD with insulin  DX E10.9   Tresiba  FlexTouch 100 UNIT/ML FlexTouch Pen Generic drug: insulin  degludec Inject 25 Units into the skin daily. What changed: how much to take   triamcinolone  55 MCG/ACT Aero nasal inhaler Commonly known as: NASACORT  INSTILL 1 SPRAY IN EACH NOSTRIL ONCE OR TWICE DAILY AS NEEDED.   UNABLE TO FIND Med Name: Pro-stat sugar free 32oz  Give (80) ML by Mouth Twice a day         Objective:   BP (!) 103/59   Pulse 72   Ht 6' 2 (1.88 m)   Wt 192 lb (87.1 kg)   SpO2 98%   BMI 24.65 kg/m   Wt Readings from Last 3 Encounters:  12/21/23 192 lb (87.1 kg)  10/12/23 200 lb 11.2 oz (91 kg)  09/20/23 206 lb (93.4 kg)    Physical Exam Physical Exam   VITALS: BP- 103/59 CHEST: Lungs clear to auscultation bilaterally. CARDIOVASCULAR: Regular heart rate and rhythm, no murmurs. EXTREMITIES: No edema in left leg, mild edema in right leg.         Assessment & Plan:   Problem List Items Addressed This Visit       Cardiovascular and Mediastinum   Hypertension, essential     Endocrine   DM type 1 (diabetes mellitus, type 1) (HCC) - Primary (Chronic)   Relevant Orders   Bayer DCA Hb A1c Waived   CMP14+EGFR   Diabetic retinopathy (HCC)     Other   Mixed hyperlipidemia      Type 1 diabetes mellitus with hypoglycemia and insulin  regimen adjustment Recent hypoglycemia episodes, especially in the mornings, require further insulin  regimen adjustments despite improved average glucose levels. - Adjust Tresiba  to 16 units once daily. - Administer Novolog  10 units at breakfast and lunch, and 14 units at dinner. - Instruct  to eat meals consistently and avoid taking Novolog  if a meal is missed. - Follow up with Mliss next week and bring glucose meter for review. - A1c shows improvement at 8.4. - Patient has very sensitive and uncontrolled blood sugars and needs to check his blood sugars 4 times a day.  He does have a 4 times a day insulin  regiment and he has had hyperglycemia and hypoglycemic episodes and needs to check 4 times a day  Heart failure with preserved ejection fraction Managed with  Xarelto  and Entresto . Blood pressure stable, mild right lower extremity swelling noted. - Continue current medications including Xarelto  and Entresto .  Atrial fibrillation on anticoagulation Stable heart rate and rhythm on Xarelto , with ongoing cardiology follow-up. - Continue Xarelto  for anticoagulation. - Continue follow-up with cardiologist.  Essential hypertension Well-controlled with current blood pressure reading. - Ensure adequate hydration.  Right lower extremity edema Mild edema likely related to previous ankle injury.       Follow up plan: Return in about 3 months (around 03/21/2024), or if symptoms worsen or fail to improve, for Type 1 diabetes recheck.  Counseling provided for all of the vaccine components Orders Placed This Encounter  Procedures   Bayer [facility] Hb A1c Waived   CMP14+EGFR    Fonda Levins, MD [facility] 12/21/2023, 11:21 AM

## 2023-12-22 LAB — CMP14+EGFR
ALT: 9 IU/L (ref 0–44)
AST: 15 IU/L (ref 0–40)
Albumin: 3.8 g/dL (ref 3.8–4.8)
Alkaline Phosphatase: 99 IU/L (ref 44–121)
BUN/Creatinine Ratio: 15 (ref 10–24)
BUN: 20 mg/dL (ref 8–27)
Bilirubin Total: 0.3 mg/dL (ref 0.0–1.2)
CO2: 23 mmol/L (ref 20–29)
Calcium: 9 mg/dL (ref 8.6–10.2)
Chloride: 108 mmol/L — ABNORMAL HIGH (ref 96–106)
Creatinine, Ser: 1.34 mg/dL — ABNORMAL HIGH (ref 0.76–1.27)
Globulin, Total: 1.9 g/dL (ref 1.5–4.5)
Glucose: 53 mg/dL — ABNORMAL LOW (ref 70–99)
Potassium: 4.3 mmol/L (ref 3.5–5.2)
Sodium: 144 mmol/L (ref 134–144)
Total Protein: 5.7 g/dL — ABNORMAL LOW (ref 6.0–8.5)
eGFR: 56 mL/min/1.73 — ABNORMAL LOW (ref >59)

## 2023-12-25 NOTE — Progress Notes (Unsigned)
 12/26/2023 Name: KYHEEM BATHGATE MRN: [id number] DOB: 1950/05/07  Chief Complaint  Patient presents with   Diabetes   ABDOUL ENCINAS is a 73 y.o. year old male who presented for a follow-up visit.    They were referred to the pharmacist by their PCP for assistance in managing diabetes.   Subjective: Patient states that his blood sugars have high and low. He did experience some lows yesterday. Mostly during the day, before lunch and before dinner. He has been using apple juice to re-correct them.   When asked about his injection site, he states that his stomach, legs, and gluteus muscle are very scarred over due to long-standing T1DM.  He is still adverse to starting an insulin  pump, however, is willing to have a referral to endocrinology to follow-up and discuss potential options for diabetes management.   Care Team: Primary Care Provider: Dettinger, Fonda LABOR, MD next appt: 12/21/2023  Medication Access/Adherence  Current Pharmacy:  [facility] - [place], [place] - [address] [place] [address] Phone: [phone] Fax: [phone]  [facility] [place], [place] - [address] [place] [address] Phone: [phone] Fax: [phone]  Patient reports affordability concerns with their medications: No  Patient reports access/transportation concerns to their pharmacy: No  Patient reports adherence concerns with their medications:  No    Diabetes:  Current medications:  Tresiba  16 units daily  Novolog  10 units prior to breakfast and lunch and 14 units prior to dinner  Prior Novolog  dose:  Taking 1 units of Novolog  per 6 g of carbs  Typically taking ~10 units with meals For every 50 pts over 100 -- adds 1 additional unit Medications tried in the past: various different brand name insulins Statin: atorvastatin  40 mg daily ARNI: Entresto  24/26  A1c 8.4% on 12/21/23, decreased from 9% on 09/18/23  Glucometer: Relion   CGM: Libre 3+ (using reader as device)- unable to download device due to technical errors. Glucose trends of high highs and intermittent lows.  Patient denies hypoglycemic s/sx including dizziness, shakiness, sweating. He does not have symptoms of lows, he is only alerted via his [place] device. Usually starts feeling lows ~50 mg/dL.  Patient denies hyperglycemic symptoms including polyuria, polydipsia, polyphagia, nocturia, neuropathy, blurred vision.  Current meal patterns:  Breakfast (~8-8:30): cereal  Lunch (~1-1:30PM): sandwich, cup of fruit Dinner (~6-7PM): varies, usually includes protein and a carbohydrate Reports 3 meals day; doesn't miss a meal ~60 grams of carbohydrates per meals Quit drinking apple juice in the mornings (but has kept juice around for low blood sugars, reports having to drink two cups of juice)  Current physical activity: as able   Current medication access support:  [facility] Medicare 10/19/23 - sent in [place] 3+ to [facility] via [employer] Patient has received his reader and sensors, however he was charged ~$300 for the delivery.   Objective: Lab Results  Component Value Date   HGBA1C 8.4 (H) 12/21/2023   Lab Results  Component Value Date   CREATININE 1.34 (H) 12/21/2023   BUN 20 12/21/2023   NA 144 12/21/2023   K 4.3 12/21/2023   CL 108 (H) 12/21/2023   CO2 23 12/21/2023   Lab Results  Component Value Date   CHOL 131 09/20/2023   HDL 48 09/20/2023   LDLCALC 65 09/20/2023   TRIG 96 09/20/2023   CHOLHDL 2.7 09/20/2023   Medications Reviewed Today     Reviewed by Bernette Falling,  RPH (Pharmacist) on 12/26/23 at 1218  Med List Status: <None>   Medication Order Taking? Sig Documenting Provider Last Dose Status Informant  Amino Acids-Protein Hydrolys (PRO-STAT) LIQD [id number]  Take 8 mLs by mouth daily. [provider]  Active   atorvastatin  (LIPITOR) 40 MG tablet [id number]  Take 1 tablet (40 mg total) by mouth daily.  Rolan Ezra RAMAN, MD  Active   carvedilol  (COREG ) 12.5 MG tablet [id number]  Take 1 tablet (12.5 mg total) by mouth 2 (two) times daily with a meal. Dose change-updated dispill Rolan Ezra RAMAN, MD  Active   Cholecalciferol  50 MCG (2000 UT) TABS [id number]  Take 2 tablets by mouth daily. [provider]  Active Self, Pharmacy Records  Continuous Glucose Receiver (FREESTYLE LIBRE 3 READER) DEVI [id number]  1 each by Does not apply route 4 (four) times daily. Dettinger, Fonda LABOR, MD  Active   Continuous Glucose Sensor (FREESTYLE LIBRE 3 PLUS SENSOR) [place] [id number]  Change sensor every 15 days. Dettinger, Fonda LABOR, MD  Active   eplerenone  (INSPRA ) 25 MG tablet [id number]  Take 1 tablet (25 mg total) by mouth daily. Rolan Ezra RAMAN, MD  Active   furosemide  (LASIX ) 40 MG tablet [id number]  Take 1 tablet (40 mg total) by mouth as needed. Rolan Ezra RAMAN, MD  Active   GARLIC PO [id number]  Take 1 capsule by mouth daily. [provider]  Active Self, Pharmacy Records  glucose blood test strip [id number]  Use as instructed Ricky Fines, MD  Active Self, Pharmacy Records  insulin  aspart (NOVOLOG  FLEXPEN) 100 UNIT/ML FlexPen [id number] Yes Inject 20-30 Units into the skin 3 (three) times daily with meals.  Patient taking differently: Inject 8-12 Units into the skin 3 (three) times daily with meals. 10 units with breakfast and lunch; 14 units with dinner   Dettinger, Fonda LABOR, MD  Active   insulin  degludec (TRESIBA  FLEXTOUCH) 100 UNIT/ML FlexTouch Pen [id number] Yes Inject 25 Units into the skin daily.  Patient taking differently: Inject 16 Units into the skin daily.   Dettinger, Fonda LABOR, MD  Active   Insulin  Pen Needle (SURE COMFORT PEN NEEDLES) 32G X 4 MM MISC [id number]  UAD with insulin  DX E10.9 Dettinger, Fonda LABOR, MD  Active   Lancets MISC [id number]  1 Lancet by Does not apply route 3 (three) times daily. Dettinger, Fonda LABOR, MD  Active Self, Pharmacy Records  levocetirizine (XYZAL ) 5 MG  tablet [id number]  TAKE ONE TABLET BY MOUTH ONCE DAILY. Dettinger, Fonda LABOR, MD  Active   Multiple Vitamin (MULTIVITAMIN WITH MINERALS) TABS tablet [id number]  Take 1 tablet by mouth daily. (0800) [provider]  Active Self, Pharmacy Records  rivaroxaban  (XARELTO ) 20 MG TABS tablet [id number]  TAKE ONE TABLET BY MOUTH EVERY EVENING WITH SUPPER Rolan Ezra RAMAN, MD  Active   ROCKLATAN  0.02-0.005 % SOLN [id number]  Apply 0.5 drops to eye at bedtime. [provider]  Active Self, Pharmacy Records  sacubitril -valsartan  (ENTRESTO ) 24-26 MG [id number]  Take 1 tablet by mouth 2 (two) times daily. Rolan Ezra RAMAN, MD  Active   sodium zirconium cyclosilicate  (LOKELMA ) 10 g PACK packet [id number]  Take 10 g by mouth daily. Rolan Ezra RAMAN, MD  Active   triamcinolone  (NASACORT ) 55 MCG/ACT AERO nasal inhaler [id number]  INSTILL 1 SPRAY IN EACH NOSTRIL ONCE OR TWICE DAILY AS NEEDED. Georgina Nancyann ORN, MD  Active Self, Pharmacy Records           Med Note [place], KEENE HERO  Fri Dec 17, 2021  2:06 PM)    CARMEL TO FIND [id number]  Med Name: Pro-stat sugar free 32oz  Give (80) ML by Mouth Twice a day [provider]  Active   vitamin C  (ASCORBIC ACID ) 500 MG tablet [id number]  Take 500 mg by mouth in the morning. (0800) [provider]  Active Self, Pharmacy Records            Assessment/Plan:   Diabetes: Currently uncontrolled, A1c decreased from 3 months. Patient still has a history of intermittent low blood sugars over the past week, followed by highs. Referral to endocrinology may be beneficial to further discuss diabetes management options.  Reviewed long term cardiovascular and renal outcomes of uncontrolled blood sugar Reviewed goal A1c, goal fasting, and goal 2 hour post prandial glucose Recommend to: Continue Tresiba  16 units daily Continue Novolog  10 units at breakfast and lunch, and 14 units at dinner Prior to fixed-dose mealtime insulin , patient did use carb  counting. However, he was having significant lows (~50-60s). There was concern for potential over-adjustment of mealtime insulin , so he was transitioned to fixed dose. Refer to endocrinology Patient is very brittle T1DM. Would benefit from additional therapeutic options from endocrinology (e.g., insulin  pump). Patient is hesitant, but open to an endocrinology referral.  Continue to use libre 3 PLUS CGM  Recommend to check glucose daily (fasting) or if symptomatic Counseled on difference between blood and fluid blood glucose, as well as the 15-20 minute delay that may occur with CGM devices and finger-stick readings   Follow Up Plan: PharmD: telephone follow-up in 3-4 weeks PCP: 03/22/2024  Woodie Jock, PharmD PGY1 Pharmacy Resident    Mliss Tarry Griffin, PharmD, BCACP, CPP Clinical Pharmacist, [facility]

## 2023-12-26 ENCOUNTER — Ambulatory Visit (INDEPENDENT_AMBULATORY_CARE_PROVIDER_SITE_OTHER): Admitting: Pharmacist

## 2023-12-26 DIAGNOSIS — E1069 Type 1 diabetes mellitus with other specified complication: Secondary | ICD-10-CM

## 2023-12-28 ENCOUNTER — Ambulatory Visit: Payer: Self-pay | Admitting: Family Medicine

## 2023-12-28 NOTE — Progress Notes (Signed)
 Reviewed results with patient and patient voiced understanding.

## 2024-01-09 ENCOUNTER — Telehealth: Payer: Self-pay | Admitting: Pharmacist

## 2024-01-09 DIAGNOSIS — E1069 Type 1 diabetes mellitus with other specified complication: Secondary | ICD-10-CM

## 2024-01-09 NOTE — Telephone Encounter (Signed)
 Patient states he is open to referral for endocrinology.  Would prefer to see Benton Rio, FNP in [facility]   Dr. Maryanne, can you please place referral to Endo ([facility], Benton Rio FNP)?  Thank you!

## 2024-01-10 NOTE — Telephone Encounter (Signed)
 Please change referral to endocrinology to [facility] in [place].  Diagnosis diabetes

## 2024-01-10 NOTE — Telephone Encounter (Signed)
 Referral placed. Pt aware.

## 2024-01-10 NOTE — Addendum Note (Signed)
 Addended by: LEIGH ROSINA SAILOR on: 01/10/2024 11:18 AM   Modules accepted: Orders

## 2024-01-11 ENCOUNTER — Other Ambulatory Visit (HOSPITAL_COMMUNITY)

## 2024-01-11 ENCOUNTER — Encounter (HOSPITAL_COMMUNITY)

## 2024-01-12 NOTE — Progress Notes (Incomplete)
 [facility] Clinic Note   PCP: Dettinger, Fonda LABOR, MD Cardiology: Dr. Rolan  73 y.o. with history of HTN, type I diabetes, and paroxysmal atrial fibrillation/CVA was referred by Dr. Francesco for evaluation of cardiomyopathy.  Patient also has had colon cancer with ileocolectomy in 2/13.  In 11/13, he fell and broke his clavicle (tripped).  Soon after this, he was admitted with DKA and profound dehydration/metabolic derangement.  He was noted to be in atrial fibrillation with RVR.  His cardiac enzymes were elevated, troponin peaked at 1.57. He initially went back into NSR but then had recurrent episodes of atrial fibrillation.  He was also started on Eliquis  due to risk of embolic CVA, however he subsequently stopped Eliquis .   He was admitted in 6/22 with right PCA CVA and hemorrhagic conversion.  Initially just started on ASA, later this was stopped and Xarelto  started.  Main residual from CVA is mild left upper visual field impairment.   CTA neck showed < 50% bilateral carotid stenosis.  Echo showed EF 30-35%, diffuse hypokinesis.  RHC/LHC in 8/22 showed nonobstructive CAD and normal filling pressures.   Cardiac MRI in 11/22 showed LV EF 38%, RVEF 46%, basal inferolateral mid-wall LGE. Diffuse LGE images.  ECV 37-46%.  Based on cMRI, differential included prior myocarditis and cardiac amyloidosis.  PYP scan was done in 2/23, showing grade 1-2, H/CL 1.25.  Invitae gene testing for TTR mutations was negative. Based on full picture, TTR amyloidosis felt unlikely.   Patient had a hypoglycemic episode in 8/24, fell and fractured his ankle.  Echo in 8/24 showed EF 45-50%, normal RV, IVC normal.   Last visit was living at a rehab facility due ankle fracture. Non-weight-bearing for 12 weeks with orthopedic boot. Spironolactone  switched to eplerenone . Referral to nephrology for diabetic nephropathy with hyperkalemia tendency.   Today he returns for HF follow up. Overall feeling fine. He remains  at rehab at [facility] , plans on going back home next month. He is not SOB with activity, works out at gym at facility doing free [employer] and stationary bike. Working with PT. Follows with Dr. Harden for LE wound, says it is healing.Blood sugars better. Occasional positional dizziness, no falls. Denies palpitations, abnormal bleeding, CP, edema, or PND/Orthopnea. Appetite ok. Weight at home 202 pounds. Taking all medications provided by facility.  ReDs reading: 21%, abnormal  ECG (personally reviewed): none ordered today.  Labs (2/23): K 4.1, creatinine 1.33, myeloma panel negative, LDL 68, TGs 58, hgb 12.8 Labs (3/24): LDL 104 Labs (8/24): K 3.7, creatinine 1.47, hgb 10.4 Labs (11/24): K 4.4, creatinine 1.45, BNP 159, hgb 12.2, LDL 67 Labs (2/25): K 4.8, creatinine 1.44 Labs (5/25): K 4.1, creatinine 2.23  PMH: 1. HTN 2. Type I diabetes 3. Anemia, thrombocytopenia, leukopenia: related to chemotherapy.  4. Clavicular fracture 11/13 due to mechanical fall.  5. 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  FOLFOX chemotherapy 3/13 - 9/13.  7. Allergic rhinitis 8. Paroxysmal atrial fibrillation: First diagnosed in 11/13 during admission for DKA. Echo (11/13): EF 55% with grade I diastolic dysfunction.  9. Right central retinal artery occlusion 10. 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  - CTA neck with <50% carotid stenosis.  11. Cardiomyopathy: Nonischemic cardiomyopathy.  Echo in 6/22 with EF 30-35%, global hypokinesis, normal RV.   - RHC/LHC (8/22): 40% ostial/proximal LAD, D1 50%.  Mean RA 2, PA 29/9, mean PCWP 8, CI 2.94.  - Cardiac MRI (11/22): LV EF 38%, RVEF 46%,  basal inferolateral mid-wall LGE. - PYP (2/23) showing grade 1-2, H/CL 1.25.  The PYP scan was equivocal.  - Echo (8/24): EF 45-50%, normal RV, IVC  normal.  12. COVID-19 2/23 13. OSA 14. CKD stage 3: Diabetic nephropathy  SH: Lives in [place].  Quit smoking in 1995.  H/o marijuana.   Quit drinking ETOH in 2022.    FH: No atrial fibrillation or CAD.   ROS: All systems reviewed and negative except as per HPI.   Current Outpatient Medications  Medication Sig Dispense Refill   Amino Acids-Protein Hydrolys (PRO-STAT) LIQD Take 8 mLs by mouth daily.     atorvastatin  (LIPITOR) 40 MG tablet Take 1 tablet (40 mg total) by mouth daily. 90 tablet 3   carvedilol  (COREG ) 12.5 MG tablet Take 1 tablet (12.5 mg total) by mouth 2 (two) times daily with a meal. Dose change-updated dispill 180 tablet 3   Cholecalciferol  50 MCG (2000 UT) TABS Take 2 tablets by mouth daily.     Continuous Glucose Receiver (FREESTYLE LIBRE 3 READER) DEVI 1 each by Does not apply route 4 (four) times daily. 1 each 1   Continuous Glucose Sensor (FREESTYLE LIBRE 3 PLUS SENSOR) MISC Change sensor every 15 days. 6 each 1   eplerenone  (INSPRA ) 25 MG tablet Take 1 tablet (25 mg total) by mouth daily. 30 tablet 11   furosemide  (LASIX ) 40 MG tablet Take 1 tablet (40 mg total) by mouth as needed. 45 tablet 3   GARLIC PO Take 1 capsule by mouth daily.     glucose blood test strip Use as instructed 100 each 12   insulin  aspart (NOVOLOG  FLEXPEN) 100 UNIT/ML FlexPen Inject 20-30 Units into the skin 3 (three) times daily with meals. (Patient taking differently: Inject 8-12 Units into the skin 3 (three) times daily with meals. 10 units with breakfast and lunch; 14 units with dinner) 30 mL 2   insulin  degludec (TRESIBA  FLEXTOUCH) 100 UNIT/ML FlexTouch Pen Inject 25 Units into the skin daily. (Patient taking differently: Inject 16 Units into the skin daily.) 24 mL 1   Insulin  Pen Needle (SURE COMFORT PEN NEEDLES) 32G X 4 MM MISC UAD with insulin  DX E10.9 300 each 3   Lancets MISC 1 Lancet by Does not apply route 3 (three) times daily. 100 each 3   levocetirizine (XYZAL ) 5 MG tablet TAKE ONE TABLET BY MOUTH ONCE DAILY. 90 tablet 3   Multiple Vitamin (MULTIVITAMIN WITH MINERALS) TABS tablet Take 1 tablet by mouth daily. (0800)      rivaroxaban  (XARELTO ) 20 MG TABS tablet TAKE ONE TABLET BY MOUTH EVERY EVENING WITH SUPPER 30 tablet 11   ROCKLATAN  0.02-0.005 % SOLN Apply 0.5 drops to eye at bedtime.     sacubitril -valsartan  (ENTRESTO ) 24-26 MG Take 1 tablet by mouth 2 (two) times daily. 60 tablet 11   sodium zirconium cyclosilicate  (LOKELMA ) 10 g PACK packet Take 10 g by mouth daily. 90 packet 3   triamcinolone  (NASACORT ) 55 MCG/ACT AERO nasal inhaler INSTILL 1 SPRAY IN EACH NOSTRIL ONCE OR TWICE DAILY AS NEEDED. 16.5 Bottle 11   UNABLE TO FIND Med Name: Pro-stat sugar free 32oz  Give (80) ML by Mouth Twice a day     vitamin C  (ASCORBIC ACID ) 500 MG tablet Take 500 mg by mouth in the morning. (0800)     No current facility-administered medications for this visit.   Wt Readings from Last 3 Encounters:  12/21/23 87.1 kg (192 lb)  10/12/23 91 kg (200 lb 11.2 oz)  09/20/23 93.4 kg (206 lb)   There were no vitals taken for this visit.  PHYSICAL EXAM: General:  NAD. No resp difficulty, chronically-ill appearing, walked into clinic HEENT: Normal Neck: Supple. No JVD. Cor: Regular rate & rhythm. No rubs, gallops or murmurs. Lungs: Clear Abdomen: Soft, nontender, nondistended.  Extremities: No cyanosis, clubbing, rash, edema Neuro: Alert & oriented x 3, moves all 4 extremities w/o difficulty. Affect pleasant.  Assessment/Plan: 1. Atrial fibrillation:  Paroxysmal. Denies breakthrough symptoms. Regular on exam today. - Continue Coreg  12.5 mg bid. - Continue Xarelto  20 mg daily. No bleeding issues.  2. Chronic systolic CHF: Nonischemic cardiomyopathy.  Echo in 6/22 at time of CVA showed EF 30-35%, diffuse hypokinesis.  LHC/RHC in 8/22 showed nonobstructive CAD and normal filling pressures.  Cardiac MRI in 11/22 showed LV EF 38%, RVEF 46%, basal inferolateral mid-wall LGE suggesting possible prior viral myocarditis vs cardiac amyloidosis.  PYP scan was equivocal but likely negative, Invitae gene testing negative for  transthyretin mutation, myeloma panel was negative => suspect TTR cardiac amyloidosis is unlikely.  Prior viral myocarditis or a diabetic cardiomyopathy (poor control of blood glucose) are the most likely etiologies.  Echo in 8/24 showed EF 45-50%, normal RV, IVC  normal.  NYHA class I-II symptoms, not volume overloaded by exam, likely on the lower side with ReDs 21%.  GDMT titration limited by CKD and soft BP.  - Labs reviewed from facility 08/30/23, K 4.1, SCr 2.23 - With worsening renal function, change Lasix  to 40 mg PRN. Repeat BMET in 2 weeks.  - Continue Coreg  12.5 mg bid. BP too soft for dose titration.  - Continue Entresto  24/26 bid.  He is on daily Lokelma  10 g to control K.  - Continue eplerenone  25 mg daily.  - No SGLT2 inhibitor with type 1 diabetes.  - EF out of range for ICD.  Narrow QRS so not CRT candidate.  - Update echo next visit 3. Hyperlipidemia: Continue atorvastatin  40 mg daily.  - Good lipids 11/24. 4. CAD: Nonobstructive on cath 8/22. No chest pain.  - Continue statin.  - No ASA given Xarelto  use.  5. Type 1 diabetes: Glucose control seems poor and ankle wound not healing well.   - Have recommended seeing endocrinology in the past but patient declined. 6. CKD stage 3: Diabetic nephropathy with a tendency towards hyperkalemia.  - We have referred him to Nephrology but he has not heard back. Will check on referral today.  Follow up in 3-4 months with Dr. Rolan + echo   Helen, FNP-BC 01/12/24

## 2024-01-15 ENCOUNTER — Telehealth: Payer: Self-pay

## 2024-01-15 NOTE — Telephone Encounter (Signed)
 The patient's schedule has been updated to reflect the provider's new office hours. A reminder letter has been sent with the revised appointment time.

## 2024-01-17 ENCOUNTER — Ambulatory Visit (HOSPITAL_COMMUNITY)

## 2024-01-17 ENCOUNTER — Encounter (HOSPITAL_COMMUNITY)

## 2024-01-23 ENCOUNTER — Other Ambulatory Visit (INDEPENDENT_AMBULATORY_CARE_PROVIDER_SITE_OTHER)

## 2024-01-23 DIAGNOSIS — E1059 Type 1 diabetes mellitus with other circulatory complications: Secondary | ICD-10-CM

## 2024-01-23 NOTE — Progress Notes (Signed)
 12/26/2023 Name: Greg Robertson MRN: [id number] DOB: 1950-11-09  Chief Complaint  Patient presents with   Diabetes   Greg Robertson is a 73 y.o. year old male who presented for a follow-up visit.    They were referred to the pharmacist by their PCP for assistance in managing diabetes.   Subjective: Patient states that his blood sugars have high and low. He did experience some lows yesterday. Mostly during the day, before lunch and before dinner. He has been using apple juice to re-correct them.   When asked about his injection site, he states that his stomach, legs, and gluteus muscle are very scarred over due to long-standing T1DM.  He is still adverse to starting an insulin  pump, however, is willing to have a referral to endocrinology to follow-up and discuss potential options for diabetes management.   Care Team: Primary Care Provider: Dettinger, Fonda LABOR, MD next appt: 12/21/2023  Medication Access/Adherence  Current Pharmacy:  [facility] - [place], [place] - [address] [place] [address] Phone: [phone] Fax: [phone]  [facility] [place] - [address] [place] [address] Phone: [phone] Fax: [phone]  Patient reports affordability concerns with their medications: No  Patient reports access/transportation concerns to their pharmacy: No  Patient reports adherence concerns with their medications:  No    Diabetes:  Current medications:  Tresiba  16 units daily  Novolog  10 units prior to breakfast and lunch and 14 units prior to dinner  Prior Novolog  dose:  Taking 1 units of Novolog  per 6-8 g of carbs  Typically taking ~10 units with meals For every 50 pts over 100 -- adds 1 additional unit Medications tried in the past: various different brand name insulins Statin: atorvastatin  40 mg daily ARNI: Entresto  24/26  A1c 8.4% on 12/21/23, decreased from 9% on 09/18/23  Glucometer: Relion   CGM: Libre 3+ (using reader as device)- unable to download device due to technical errors. Glucose trends of high highs and intermittent lows.  Patient denies hypoglycemic s/sx including dizziness, shakiness, sweating. He does not have symptoms of lows, he is only alerted via his [place] device. Usually starts feeling lows ~50 mg/dL.  Patient denies hyperglycemic symptoms including polyuria, polydipsia, polyphagia, nocturia, neuropathy, blurred vision.  Current meal patterns:  Breakfast (~8-8:30): cereal  Lunch (~1-1:30PM): sandwich, cup of fruit Dinner (~6-7PM): varies, usually includes protein and a carbohydrate Reports 3 meals day; doesn't miss a meal ~60 grams of carbohydrates per meals Quit drinking apple juice in the mornings (but has kept juice around for low blood sugars, reports having to drink two cups of juice)  Current physical activity: as able   Current medication access support:  [facility] Medicare 10/19/23 - sent in [place] 3+ to [facility] via [employer] Patient has received his reader and sensors, however he was charged ~$300 for the delivery.   Objective: Lab Results  Component Value Date   HGBA1C 8.4 (H) 12/21/2023   Lab Results  Component Value Date   CREATININE 1.34 (H) 12/21/2023   BUN 20 12/21/2023   NA 144 12/21/2023   K 4.3 12/21/2023   CL 108 (H) 12/21/2023   CO2 23 12/21/2023   Lab Results  Component Value Date   CHOL 131 09/20/2023   HDL 48 09/20/2023   LDLCALC 65 09/20/2023   TRIG 96 09/20/2023   CHOLHDL 2.7 09/20/2023   Medications Reviewed Today   Medications were not reviewed in this  encounter     Assessment/Plan:   Diabetes: Currently uncontrolled, A1c decreased from 3 months. Patient still has a history of intermittent low blood sugars over the past week, followed by highs. Referral to endocrinology may be beneficial to further discuss diabetes management options.  Reviewed long term cardiovascular and renal outcomes of  uncontrolled blood sugar Reviewed goal A1c, goal fasting, and goal 2 hour post prandial glucose Recommend to: Continue Tresiba  16 units daily Continue Novolog  10 units at breakfast and lunch, and 14 units at dinner Prior to fixed-dose mealtime insulin , patient did use carb counting. However, he was having significant lows (~50-60s). There was concern for potential over-adjustment of mealtime insulin , so he was transitioned to fixed dose. Refer to endocrinology Patient is very brittle T1DM. Would benefit from additional therapeutic options from endocrinology (e.g., insulin  pump). Patient is hesitant, but open to an endocrinology referral. We are still awaiting referral approval Continue to use libre 3 PLUS CGM with reader Recommend to check glucose continuously Counseled on difference between blood and fluid blood glucose, as well as the 15-20 minute delay that may occur with CGM devices and finger-stick readings   Mliss Tarry Griffin, PharmD, BCACP, CPP Clinical Pharmacist, [facility]

## 2024-01-26 ENCOUNTER — Encounter: Payer: Self-pay | Admitting: Gastroenterology

## 2024-01-26 ENCOUNTER — Ambulatory Visit: Admitting: Gastroenterology

## 2024-01-26 VITALS — BP 128/64 | HR 65 | Ht 74.0 in | Wt 188.0 lb

## 2024-01-26 DIAGNOSIS — Z15068 Genetic susceptibility to other malignant neoplasm of digestive system: Secondary | ICD-10-CM

## 2024-01-26 DIAGNOSIS — D132 Benign neoplasm of duodenum: Secondary | ICD-10-CM | POA: Diagnosis not present

## 2024-01-26 DIAGNOSIS — Z15060 Genetic susceptibility to colorectal cancer: Secondary | ICD-10-CM

## 2024-01-26 DIAGNOSIS — R194 Change in bowel habit: Secondary | ICD-10-CM

## 2024-01-26 DIAGNOSIS — Z85038 Personal history of other malignant neoplasm of large intestine: Secondary | ICD-10-CM

## 2024-01-26 DIAGNOSIS — Z1507 Genetic susceptibility to malignant neoplasm of urinary tract: Secondary | ICD-10-CM

## 2024-01-26 DIAGNOSIS — Z1509 Genetic susceptibility to other malignant neoplasm: Secondary | ICD-10-CM

## 2024-01-26 NOTE — Patient Instructions (Signed)
 A high fiber diet with plenty of fluids (up to 8 glasses of water daily) is suggested to relieve these symptoms.  Metamucil, 1 tablespoon once or twice daily can be used to keep bowels regular if needed.   Consider Cholestyramine in the future.   You have been scheduled for an endoscopy and colonoscopy. Please follow the written instructions given to you at your visit today.  If you use inhalers (even only as needed), please bring them with you on the day of your procedure.  DO NOT TAKE 7 DAYS PRIOR TO TEST- Trulicity (dulaglutide) Ozempic, Wegovy (semaglutide) Mounjaro (tirzepatide) Bydureon Bcise (exanatide extended release)  DO NOT TAKE 1 DAY PRIOR TO YOUR TEST Rybelsus (semaglutide) Adlyxin (lixisenatide) Victoza (liraglutide) Byetta (exanatide) ___________________________________________________________________________  _______________________________________________________  If your blood pressure at your visit was 140/90 or greater, please contact your primary care physician to follow up on this.  _______________________________________________________  If you are age 23 or older, your body mass index should be between 23-30. Your Body mass index is 24.14 kg/m. If this is out of the aforementioned range listed, please consider follow up with your Primary Care Provider.  If you are age 68 or younger, your body mass index should be between 19-25. Your Body mass index is 24.14 kg/m. If this is out of the aformentioned range listed, please consider follow up with your Primary Care Provider.   ________________________________________________________  The [facility] providers would like to encourage you to use MYCHART to communicate with providers for non-urgent requests or questions.  Due to long hold times on the telephone, sending your provider a message by [facility] may be a faster and more efficient way to get a response.  Please allow 48 business hours for a response.  Please  remember that this is for non-urgent requests.  _______________________________________________________  [facility] is using a team-based approach to care.  Your team is made up of your doctor and two to three APPS. Our APPS (Nurse Practitioners and Physician Assistants) work with your physician to ensure care continuity for you. They are fully qualified to address your health concerns and develop a treatment plan. They communicate directly with your gastroenterologist to care for you. Seeing the Advanced Practice Practitioners on your physician's team can help you by facilitating care more promptly, often allowing for earlier appointments, access to diagnostic testing, procedures, and other specialty referrals.   Thank you for choosing me and  Gastroenterology.  Dr. Wilhelmenia

## 2024-01-26 NOTE — Progress Notes (Unsigned)
 GASTROENTEROLOGY OUTPATIENT CLINIC VISIT   Primary Care Provider Dettinger, Fonda LABOR, MD [address] [place] [address] [phone]   Patient Profile: Greg Robertson is a 73 y.o. male with a pmh significant for atrial fibrillation, CHF, hypertension, hyperlipidemia, diabetes Lynch syndrome and prior colon cancer (status post right colectomy), duodenal adenoma.  The patient presents to the [facility] for an evaluation and management of problem(s) noted below:  Problem List 1. Lynch syndrome   2. History of colon cancer   3. Duodenal adenoma   4. Change in bowel habits    Discussed the use of AI scribe software for clinical note transcription with the patient, who gave verbal consent to proceed.  History of Present Illness Please see prior GI notes for full details of HPI.  Interval History Greg Robertson Melvenia is a 73 year old male with Lynch Syndrome and prior colon cancer and duodenal adenoma who presents for follow-up.  We saw him last year and planned for EGD/Colonoscopy.  This had to be rescheduled due to other health issues at the time.   No new changes in bowel habits, blood in stool, or upper GI symptoms such as heartburn or dysphagia. Bowel movements vary between solid and softer, with occasional diarrhea. 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  He is not interested currently in Cholestyramine.  He is scheduled for an echocardiogram at the end of the month as a routine annual procedure. No new cardiac symptoms.  He is diabetic and uses Novolog  and Tresiba .   GI Review of Systems Positive as above Negative for pyrosis, dysphagia, odynophagia, nausea, vomiting, pain, melena, hematochezia  Review of Systems General: Denies fevers/chills/weight loss unintentionally Cardiovascular: Denies chest  pain Pulmonary: Denies shortness of breath Gastroenterological: See HPI Genitourinary: Denies darkened urine Hematological: Positive for history of easy bruising/bleeding due to anticoagulation Endocrine: Denies temperature intolerance Dermatological: Denies jaundice Psychological: Mood is stable   Medications Current Outpatient Medications  Medication Sig Dispense Refill   Amino Acids-Protein Hydrolys (PRO-STAT) LIQD Take 8 mLs by mouth daily.     atorvastatin  (LIPITOR) 40 MG tablet Take 1 tablet (40 mg total) by mouth daily. 90 tablet 3   carvedilol  (COREG ) 12.5 MG tablet Take 1 tablet (12.5 mg total) by mouth 2 (two) times daily with a meal. Dose change-updated dispill 180 tablet 3   Cholecalciferol  50 MCG (2000 UT) TABS Take 2 tablets by mouth daily.     Continuous Glucose Receiver (FREESTYLE LIBRE 3 READER) DEVI 1 each by Does not apply route 4 (four) times daily. 1 each 1   Continuous Glucose Sensor (FREESTYLE LIBRE 3 PLUS SENSOR) MISC Change sensor every 15 days. 6 each 1   GARLIC PO Take 1 capsule by mouth daily.     glucose blood test strip Use as instructed 100 each 12   insulin  aspart (NOVOLOG  FLEXPEN) 100 UNIT/ML FlexPen Inject 20-30 Units into the skin 3 (three) times daily with meals. (Patient taking differently: Inject 8-12 Units into the skin 3 (three) times daily with meals. 10 units with breakfast and lunch; 14 units with dinner) 30 mL 2   insulin  degludec (TRESIBA  FLEXTOUCH) 100 UNIT/ML FlexTouch Pen Inject 25 Units into the skin daily. (Patient taking differently: Inject 16 Units into the skin daily.) 24 mL 1   Insulin  Pen Needle (SURE COMFORT PEN NEEDLES) 32G X 4 MM MISC UAD  with insulin  DX E10.9 300 each 3   Lancets MISC 1 Lancet by Does not apply route 3 (three) times daily. 100 each 3   levocetirizine (XYZAL ) 5 MG tablet TAKE ONE TABLET BY MOUTH ONCE DAILY. 90 tablet 3   Multiple Vitamin (MULTIVITAMIN WITH MINERALS) TABS tablet Take 1 tablet by mouth daily. (0800)      rivaroxaban  (XARELTO ) 20 MG TABS tablet TAKE ONE TABLET BY MOUTH EVERY EVENING WITH SUPPER 30 tablet 11   ROCKLATAN  0.02-0.005 % SOLN Apply 0.5 drops to eye at bedtime.     sacubitril -valsartan  (ENTRESTO ) 24-26 MG Take 1 tablet by mouth 2 (two) times daily. 60 tablet 11   sodium zirconium cyclosilicate  (LOKELMA ) 10 g PACK packet Take 10 g by mouth daily. 90 packet 3   triamcinolone  (NASACORT ) 55 MCG/ACT AERO nasal inhaler INSTILL 1 SPRAY IN EACH NOSTRIL ONCE OR TWICE DAILY AS NEEDED. 16.5 Bottle 11   UNABLE TO FIND Med Name: Pro-stat sugar free 32oz  Give (80) ML by Mouth Twice a day     vitamin C  (ASCORBIC ACID ) 500 MG tablet Take 500 mg by mouth in the morning. (0800)     eplerenone  (INSPRA ) 25 MG tablet Take 1 tablet (25 mg total) by mouth daily. (Patient not taking: Reported on 01/26/2024) 30 tablet 11   furosemide  (LASIX ) 40 MG tablet Take 1 tablet (40 mg total) by mouth as needed. (Patient not taking: Reported on 01/26/2024) 45 tablet 3   No current facility-administered medications for this visit.    Allergies Allergies  Allergen Reactions   Oxycodone  Nausea And Vomiting    Histories Past Medical History:  Diagnosis Date   Allergic rhinitis    Anemia    Atrial fibrillation (HCC)    Persistent   Cancer of ascending colon, 7cm 05/25/2011   Congestive heart failure (CHF) (HCC)    Diabetes mellitus type I (HCC)    Glaucoma    Hypertension    Pneumonia 1979 or 1980   Prostate nodule    Substance abuse (HCC)    Past Surgical History:  Procedure Laterality Date   broken left shoulder blade and collar bone     COLONOSCOPY WITH PROPOFOL  N/A 04/29/2021   Procedure: COLONOSCOPY WITH PROPOFOL ;  Surgeon: Teressa Toribio SQUIBB, MD;  Location: [facility];  Service: Endoscopy;  Laterality: N/A;   ESOPHAGOGASTRODUODENOSCOPY (EGD) WITH PROPOFOL  N/A 04/29/2021   Procedure: ESOPHAGOGASTRODUODENOSCOPY (EGD) WITH PROPOFOL ;  Surgeon: Teressa Toribio SQUIBB, MD;  Location: [facility];  Service:  Endoscopy;  Laterality: N/A;   FINGER SURGERY  2009   right middle   HEMOSTASIS CLIP PLACEMENT  04/29/2021   Procedure: HEMOSTASIS CLIP PLACEMENT;  Surgeon: Teressa Toribio SQUIBB, MD;  Location: [facility];  Service: Endoscopy;;   LAPAROSCOPIC ASSISTED 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     NASAL FRACTURE SURGERY  1968   ORIF ANKLE FRACTURE Right 12/08/2022   Procedure: OPEN REDUCTION INTERNAL FIXATION (ORIF) ANKLE FRACTURE;  Surgeon: Georgina Ozell LABOR, MD;  Location: [facility] OR;  Service: Orthopedics;  Laterality: Right;   POLYPECTOMY  04/29/2021   Procedure: POLYPECTOMY;  Surgeon: Teressa Toribio SQUIBB, MD;  Location: [facility];  Service: Endoscopy;;   PORTACATH PLACEMENT  07/01/2011   Procedure: INSERTION PORT-A-CATH;  Surgeon: Elspeth KYM Schultze, MD;  Location: [facility] ORS;  Service: General;  Laterality: Left;  Insertion of Port-A-Catheter Left Internal Jugular   RIGHT/LEFT HEART CATH AND CORONARY ANGIOGRAPHY N/A 12/08/2020   Procedure: RIGHT/LEFT HEART CATH AND CORONARY ANGIOGRAPHY;  Surgeon: Rolan Ezra RAMAN, MD;  Location: [facility] INVASIVE CV LAB;  Service: Cardiovascular;  Laterality: N/A;   TONSILLECTOMY  1957 - approximate   Social History   Socioeconomic History   Marital status: Single    Spouse name: Not on file   Number of children: 0   Years of education: 16   Highest education level: Bachelor's degree (e.g., BA, AB, BS)  Occupational History   Occupation: Retired    Comment: Airline pilot  Tobacco Use   Smoking status: Former    Current packs/day: 0.00    Average packs/day: 1 pack/day for 15.0 years (15.0 ttl pk-yrs)    Types: Cigarettes    Start date: 04/18/1978    Quit date: 04/18/1993    Years since quitting: 30.8   Smokeless tobacco: Never   Tobacco comments:    marijuana every night   Vaping Use   Vaping status: Never Used  Substance and Sexual Activity   Alcohol use: Not Currently    Alcohol/week: 5.0 standard drinks of alcohol    Types: 3 Cans of beer, 2 Shots of liquor per week     Comment: 1 drink every 2 days   Drug use: Not Currently    Types: Marijuana    Comment: once a night marijuana   Sexual activity: Not Currently  Other Topics Concern   Not on file  Social History Narrative   Pt lives alone    R handed   Caffeine: no coffee/or soda. Has sweet tea for dinner.    Social Drivers of Corporate investment banker Strain: Low Risk  (02/24/2023)   Overall Financial Resource Strain (CARDIA)    Difficulty of Paying Living Expenses: Not hard at all  Food Insecurity: No Food Insecurity (02/24/2023)   Hunger Vital Sign    Worried About Running Out of Food in the Last Year: Never true    Ran Out of Food in the Last Year: Never true  Transportation Needs: No Transportation Needs (02/24/2023)   PRAPARE - Administrator, Civil Service (Medical): No    Lack of Transportation (Non-Medical): No  Physical Activity: Inactive (02/24/2023)   Exercise Vital Sign    Days of Exercise per Week: 0 days    Minutes of Exercise per Session: 0 min  Stress: No Stress Concern Present (02/24/2023)   [employer] of Occupational Health - Occupational Stress Questionnaire    Feeling of Stress : Not at all  Social Connections: Socially Isolated (02/24/2023)   Social Connection and Isolation Panel    Frequency of Communication with Friends and Family: More than three times a week    Frequency of Social Gatherings with Friends and Family: More than three times a week    Attends Religious Services: Never    Database administrator or Organizations: No    Attends Banker Meetings: Never    Marital Status: Never married  Intimate Partner Violence: Not At Risk (02/24/2023)   Humiliation, Afraid, Rape, and Kick questionnaire    Fear of Current or Ex-Partner: No    Emotionally Abused: No    Physically Abused: No    Sexually Abused: No   Family History  Problem Relation Age of Onset   Breast cancer Mother    Pancreatitis Mother        intestinal adhesions    Insomnia Mother    Colon polyps Father    Lung cancer Father    Diabetes Father    Prostate cancer Father    Colon cancer Neg Hx  Rectal cancer Neg Hx    Esophageal cancer Neg Hx    Inflammatory bowel disease Neg Hx    Liver disease Neg Hx    Pancreatic cancer Neg Hx    Stomach cancer Neg Hx    I have reviewed his medical, social, and family history in detail and updated the electronic medical record as necessary.    PHYSICAL EXAMINATION  BP 128/64   Pulse 65   Ht 6' 2 (1.88 m)   Wt 188 lb (85.3 kg)   BMI 24.14 kg/m  Wt Readings from Last 3 Encounters:  01/26/24 188 lb (85.3 kg)  12/21/23 192 lb (87.1 kg)  10/12/23 200 lb 11.2 oz (91 kg)  GEN: NAD, appears stated age, doesn't appear chronically ill PSYCH: Cooperative, without pressured speech EYE: Conjunctivae pink, sclerae anicteric ENT: MMM CV: Nontachycardic RESP: No audible wheezing GI: NABS, soft, NT, surgical scars present, without rebound MSK/EXT: No significant lower extremity edema SKIN: No jaundice NEURO:  Alert & Oriented x 3, no focal deficits   REVIEW OF DATA  I reviewed the following data at the time of this encounter:  GI Procedures and Studies  Today we we reviewed his 2023 endoscopic evaluation as below.  January 2023 EGD - A single 13 mm sessile polyp was found in the third portion of the duodenum, adjacent to the 2019 tattoo site. I resected the polyp with snare/cautery (endo cut Q setting) and then removed it with a [employer]. The polypectomy site slowly, persistently oozed blood and so I placed three endo clips (Resolution 360s) at the site in good position which resulted in immediate, complete cessation of bleeding. - The examination was otherwise normal.  January 2023 colonoscopy - Normal right sided ileocolonic anastomosis. - Otherwise normal examination.  Laboratory Studies  Reviewed those in epic  Imaging Studies  No new imaging studies to review   ASSESSMENT/PLAN  Mr. Ide  is a 73 y.o. male.  The patient is seen today for evaluation and management of:  1. Lynch syndrome   2. History of colon cancer   3. Duodenal adenoma   4. Change in bowel habits    The patient is clinically and hemodynamically stable at this time.   Lynch syndrome with history of colon cancer & duodenal adenoma. Increased risk of cancer recurrence necessitating regular surveillance.  Colonoscopy and upper endoscopy are currently overdue for surveillance.  Risks of procedures include perforation, bleeding, infection, and aspiration (he knows these from the past).  As long as not issues from a Cardiac perspective or new issues on TTE, then will plan for procedures in the coming weeks. - Schedule colonoscopy and upper endoscopy for mid-November to December. - Perform procedures in the hospital setting to allow for potential additional work on the duodenal polyp site if required. - Use MiraLAX /Gatorade for bowel preparation per his request. - Follow-up Dr. 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 Differential includes irritable bowel syndrome, microscopic colitis, and collagenous colitis, Bile salt diarrhea.   - Consider trial of bile acid sequestrant if diarrhea becomes bothersome. - Take biopsies during colonoscopy to rule out microscopic colitis or other conditions. - Consider bulking fiber supplementation with Metamucil or Benefiber to bulk stool and regulate bowel movements.    Orders Placed This Encounter  Procedures   Procedural/ Surgical Case Request: COLONOSCOPY, EGD (ESOPHAGOGASTRODUODENOSCOPY)   Ambulatory referral to Gastroenterology    New Prescriptions   No medications on file   Modified Medications   No  medications on file    Planned Follow Up No follow-ups on file.   Total Time in Face-to-Face and in Coordination of Care for patient including independent/personal interpretation/review of prior testing, medical history, examination, medication adjustment, communicating results with the patient directly, and documentation within the EHR is 25 minutes.   Aloha Finner, MD [facility] Advanced Endoscopy Office # [phone]

## 2024-01-28 ENCOUNTER — Encounter: Payer: Self-pay | Admitting: Gastroenterology

## 2024-01-29 ENCOUNTER — Telehealth: Payer: Self-pay

## 2024-01-29 NOTE — Telephone Encounter (Signed)
 Request for surgical clearance:     Endoscopy Procedure  What type of surgery is being performed? Endo/Colonoscopy   When is this surgery scheduled?     03/21/24  What type of clearance is required ?   Pharmacy  Are there any medications that need to be held prior to surgery and how long? Xarelto  x2 days prior to procedure  Practice name and name of physician performing surgery?      [facility]  What is your office phone and fax number?      Phone- [phone]  Fax- [phone]  Anesthesia type (None, local, MAC, general) ?       MAC  Please route your response to [facility]

## 2024-02-04 NOTE — Telephone Encounter (Signed)
 Patient with diagnosis of atrial fibrillation on Xarelto  for anticoagulation.    What type of surgery is being performed? Endo/Colonoscopy    When is this surgery scheduled?     03/21/24   CHA2DS2-VASc Score = 6   This indicates a 9.7% annual risk of stroke. The patient's score is based upon: CHF History: 1 HTN History: 1 Diabetes History: 1 Stroke History: 2 Vascular Disease History: 0 Age Score: 1 Gender Score: 0   Chart indicates stroke 09/2020 (not on anticoagulation)  CrCl 60 Platelet count 159  Patient has not had an Afib/aflutter ablation in the last 3 months, DCCV within the last 4 weeks or a watchman implanted in the last 45 days   Per office protocol, patient can hold Xarelto  for 2 days prior to procedure.   Patient will not need bridging with Lovenox  (enoxaparin ) around procedure.  **This guidance is not considered finalized until pre-operative APP has relayed final recommendations.**

## 2024-02-07 NOTE — Telephone Encounter (Signed)
   Patient Name: Greg Robertson  DOB: 09-Nov-1950 MRN: [id number]  Primary Cardiologist: Ezra Shuck, MD  Clinical pharmacists have reviewed the patient's past medical history, labs, and current medications as part of preoperative protocol coverage. The following recommendations have been made:  Patient with diagnosis of atrial fibrillation on Xarelto  for anticoagulation.     What type of surgery is being performed? Endo/Colonoscopy    When is this surgery scheduled?     03/21/24     CHA2DS2-VASc Score = 6   This indicates a 9.7% annual risk of stroke. The patient's score is based upon: CHF History: 1 HTN History: 1 Diabetes History: 1 Stroke History: 2 Vascular Disease History: 0 Age Score: 1 Gender Score: 0   Chart indicates stroke 09/2020 (not on anticoagulation)   CrCl 60 Platelet count 159   Patient has not had an Afib/aflutter ablation in the last 3 months, DCCV within the last 4 weeks or a watchman implanted in the last 45 days    Per office protocol, patient can hold Xarelto  for 2 days prior to procedure.   Patient will not need bridging with Lovenox  (enoxaparin ) around procedure.  I will route this recommendation to the requesting party via Epic fax function and remove from pre-op pool.  Please call with questions.  Lamarr Satterfield, NP 02/07/2024, 7:42 AM

## 2024-02-07 NOTE — Telephone Encounter (Signed)
 Per office protocol, patient can hold Xarelto  for 2 days prior to procedure.   Patient will not need bridging with Lovenox  (enoxaparin ) around procedure. Patient has been informed and voiced understanding.

## 2024-02-15 ENCOUNTER — Telehealth (HOSPITAL_COMMUNITY): Payer: Self-pay

## 2024-02-15 NOTE — Telephone Encounter (Signed)
 Called to confirm/remind patient of their appointment at the [facility] on 02/16/24.   Appointment:   [x] Confirmed  [] Left mess   [] No answer/No voice mail  [] VM Full/unable to leave message  [] Phone not in service  Patient reminded to bring all medications and/or complete list.  Confirmed patient has transportation. Gave directions, instructed to utilize valet parking.

## 2024-02-16 ENCOUNTER — Encounter (HOSPITAL_COMMUNITY): Payer: Self-pay

## 2024-02-16 ENCOUNTER — Ambulatory Visit (HOSPITAL_COMMUNITY)
Admission: RE | Admit: 2024-02-16 | Discharge: 2024-02-16 | Disposition: A | Discharge status: Home / Self Care | Source: Ambulatory Visit | Attending: Family Medicine | Admitting: Family Medicine

## 2024-02-16 ENCOUNTER — Ambulatory Visit (HOSPITAL_BASED_OUTPATIENT_CLINIC_OR_DEPARTMENT_OTHER)
Admission: RE | Admit: 2024-02-16 | Discharge: 2024-02-16 | Disposition: A | Discharge status: Home / Self Care | Source: Ambulatory Visit | Attending: Family Medicine | Admitting: Family Medicine

## 2024-02-16 VITALS — BP 124/58 | HR 68 | Ht 74.0 in | Wt 188.4 lb

## 2024-02-16 DIAGNOSIS — N183 Chronic kidney disease, stage 3 unspecified: Secondary | ICD-10-CM | POA: Insufficient documentation

## 2024-02-16 DIAGNOSIS — I428 Other cardiomyopathies: Secondary | ICD-10-CM | POA: Diagnosis not present

## 2024-02-16 DIAGNOSIS — Z7901 Long term (current) use of anticoagulants: Secondary | ICD-10-CM | POA: Diagnosis not present

## 2024-02-16 DIAGNOSIS — Z79899 Other long term (current) drug therapy: Secondary | ICD-10-CM | POA: Diagnosis not present

## 2024-02-16 DIAGNOSIS — E785 Hyperlipidemia, unspecified: Secondary | ICD-10-CM | POA: Insufficient documentation

## 2024-02-16 DIAGNOSIS — I480 Paroxysmal atrial fibrillation: Secondary | ICD-10-CM | POA: Insufficient documentation

## 2024-02-16 DIAGNOSIS — Z87891 Personal history of nicotine dependence: Secondary | ICD-10-CM | POA: Diagnosis not present

## 2024-02-16 DIAGNOSIS — Z8673 Personal history of transient ischemic attack (TIA), and cerebral infarction without residual deficits: Secondary | ICD-10-CM | POA: Insufficient documentation

## 2024-02-16 DIAGNOSIS — I130 Hypertensive heart and chronic kidney disease with heart failure and stage 1 through stage 4 chronic kidney disease, or unspecified chronic kidney disease: Secondary | ICD-10-CM | POA: Insufficient documentation

## 2024-02-16 DIAGNOSIS — E1022 Type 1 diabetes mellitus with diabetic chronic kidney disease: Secondary | ICD-10-CM | POA: Insufficient documentation

## 2024-02-16 DIAGNOSIS — Z794 Long term (current) use of insulin: Secondary | ICD-10-CM | POA: Diagnosis not present

## 2024-02-16 DIAGNOSIS — N1831 Chronic kidney disease, stage 3a: Secondary | ICD-10-CM

## 2024-02-16 DIAGNOSIS — I5022 Chronic systolic (congestive) heart failure: Secondary | ICD-10-CM | POA: Insufficient documentation

## 2024-02-16 DIAGNOSIS — E782 Mixed hyperlipidemia: Secondary | ICD-10-CM

## 2024-02-16 DIAGNOSIS — I2510 Atherosclerotic heart disease of native coronary artery without angina pectoris: Secondary | ICD-10-CM | POA: Diagnosis not present

## 2024-02-16 LAB — BASIC METABOLIC PANEL WITH GFR
Anion gap: 7 (ref 5–15)
BUN: 23 mg/dL (ref 8–23)
CO2: 26 mmol/L (ref 22–32)
Calcium: 8.8 mg/dL — ABNORMAL LOW (ref 8.9–10.3)
Chloride: 104 mmol/L (ref 98–111)
Creatinine, Ser: 1.33 mg/dL — ABNORMAL HIGH (ref 0.61–1.24)
GFR, Estimated: 57 mL/min — ABNORMAL LOW (ref >60)
Glucose, Bld: 182 mg/dL — ABNORMAL HIGH (ref 70–99)
Potassium: 4.7 mmol/L (ref 3.5–5.1)
Sodium: 137 mmol/L (ref 135–145)

## 2024-02-16 LAB — ECHOCARDIOGRAM COMPLETE
Area-P 1/2: 2.74 cm2
Calc EF: 52.3 %
S' Lateral: 2.5 cm
Single Plane A2C EF: 52.7 %
Single Plane A4C EF: 51.3 %

## 2024-02-16 LAB — BRAIN NATRIURETIC PEPTIDE: B Natriuretic Peptide: 133.6 pg/mL — ABNORMAL HIGH (ref 0.0–100.0)

## 2024-02-16 MED ORDER — FUROSEMIDE 40 MG PO TABS: 40.0000 mg | ORAL_TABLET | ORAL | 3 refills | Status: DC | PRN

## 2024-02-16 MED ORDER — EPLERENONE 25 MG PO TABS: 25.0000 mg | ORAL_TABLET | Freq: Every day | ORAL | 11 refills | Status: DC

## 2024-02-16 NOTE — Progress Notes (Addendum)
 Advanced [facility] Failure Clinic Note   PCP: Dettinger, Fonda LABOR, MD Cardiology: Dr. Rolan  73 y.o. with history of HTN, type I diabetes, and paroxysmal atrial fibrillation/CVA was referred by Dr. Francesco for evaluation of cardiomyopathy.  Patient also has had colon cancer with ileocolectomy in 2/13.  In 11/13, he fell and broke his clavicle (tripped).  Soon after this, he was admitted with DKA and profound dehydration/metabolic derangement.  He was noted to be in atrial fibrillation with RVR.  His cardiac enzymes were elevated, troponin peaked at 1.57. He initially went back into NSR but then had recurrent episodes of atrial fibrillation.  He was also started on Eliquis  due to risk of embolic CVA, however he subsequently stopped Eliquis .   He was admitted in 6/22 with right PCA CVA and hemorrhagic conversion.  Initially just started on ASA, later this was stopped and Xarelto  started.  Main residual from CVA is mild left upper visual field impairment.   CTA neck showed < 50% bilateral carotid stenosis.  Echo showed EF 30-35%, diffuse hypokinesis.  RHC/LHC in 8/22 showed nonobstructive CAD and normal filling pressures.   Cardiac MRI in 11/22 showed LV EF 38%, RVEF 46%, basal inferolateral mid-wall LGE. Diffuse LGE images.  ECV 37-46%.  Based on cMRI, differential included prior myocarditis and cardiac amyloidosis.  PYP scan was done in 2/23, showing grade 1-2, H/CL 1.25.  Inv[employer]ae gene testing for TTR mutations was negative. Based on full picture, TTR amyloidosis felt unlikely.   Patient had a hypoglycemic episode in 8/24, fell and fractured his ankle.  Echo in 8/24 showed EF 45-50%, normal RV, IVC normal.   Last seen for follow-up in May. Volume status was low. Lasix  switched to PRN. Saw Nephrology in August and reports eplerenone  was stopped.  Here today for CHF follow-up and echo. He hasn't had any significant shortness of breath. No orthopnea or PND. Has chronic RLE edema following a right ankle  injury in 08/24. Weight has been stable, actually trending down slowly. Main issue has been lightheadedness with position changes. Notes intermittently low blood pressures. Blood glucose typically okay when he is lightheaded. Drinks less than 32 oz fluid a day.  ECG (personally reviewed): none ordered today.  Labs (2/23): K 4.1, creatinine 1.33, myeloma panel negative, LDL 68, TGs 58, hgb 12.8 Labs (3/24): LDL 104 Labs (8/24): K 3.7, creatinine 1.47, hgb 10.4 Labs (11/24): K 4.4, creatinine 1.45, BNP 159, hgb 12.2, LDL 67 Labs (2/25): K 4.8, creatinine 1.44 Labs (5/25): K 4.1, creatinine 2.23 Labs (9/25): K 4.3, creatinine 1.34  PMH: 1. HTN 2. Type I diabetes 3. Anemia, thrombocytopenia, leukopenia: related to chemotherapy.  4. Clavicular fracture 11/13 due to mechanical fall.  5. 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  FOLFOX chemotherapy 3/13 - 9/13.  7. Allergic rhinitis 8. Paroxysmal atrial fibrillation: First diagnosed in 11/13 during admission for DKA. Echo (11/13): EF 55% with grade I diastolic dysfunction.  9. Right central retinal artery occlusion 10. 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  - CTA neck with <50% carotid stenosis.  11. Cardiomyopathy: Nonischemic cardiomyopathy.  Echo in 6/22 with EF 30-35%, global hypokinesis, normal RV.   - RHC/LHC (8/22): 40% ostial/proximal LAD, D1 50%.  Mean RA 2, PA 29/9, mean PCWP 8, CI 2.94.  - Cardiac MRI (11/22): LV EF 38%, RVEF 46%, basal inferolateral mid-wall LGE. - PYP (2/23) showing grade 1-2, H/CL 1.25.  The PYP scan was equivocal.  - Echo (8/24): EF 45-50%, normal  RV, IVC  normal.  12. COVID-19 2/23 13. OSA 14. CKD stage 3: Diabetic nephropathy  SH: Lives in [place].  Quit smoking in 1995.  H/o marijuana.  Quit drinking ETOH in 2022.    FH: No atrial fibrillation or CAD.   ROS: All systems reviewed and negative except as per HPI.   Current Outpatient Medications  Medication Sig  Dispense Refill   Amino Acids-Protein Hydrolys (PRO-STAT) LIQD Take 8 mLs by mouth daily.     atorvastatin  (LIPITOR) 40 MG tablet Take 1 tablet (40 mg total) by mouth daily. 90 tablet 3   carvedilol  (COREG ) 12.5 MG tablet Take 1 tablet (12.5 mg total) by mouth 2 (two) times daily with a meal. Dose change-updated dispill 180 tablet 3   Cholecalciferol  50 MCG (2000 UT) TABS Take 2 tablets by mouth daily.     Continuous Glucose Receiver (FREESTYLE LIBRE 3 READER) DEVI 1 each by Does not apply route 4 (four) times daily. 1 each 1   Continuous Glucose Sensor (FREESTYLE LIBRE 3 PLUS SENSOR) MISC Change sensor every 15 days. 6 each 1   GARLIC PO Take 1 capsule by mouth daily.     glucose blood test strip Use as instructed 100 each 12   insulin  aspart (NOVOLOG  FLEXPEN) 100 UNIT/ML FlexPen Inject 20-30 Units into the skin 3 (three) times daily with meals. (Patient taking differently: Inject 8-12 Units into the skin 3 (three) times daily with meals. 10 units with breakfast and lunch; 14 units with dinner) 30 mL 2   insulin  degludec (TRESIBA  FLEXTOUCH) 100 UNIT/ML FlexTouch Pen Inject 25 Units into the skin daily. (Patient taking differently: Inject 16 Units into the skin daily.) 24 mL 1   Insulin  Pen Needle (SURE COMFORT PEN NEEDLES) 32G X 4 MM MISC UAD with insulin  DX E10.9 300 each 3   Lancets MISC 1 Lancet by Does not apply route 3 (three) times daily. 100 each 3   levocetirizine (XYZAL ) 5 MG tablet TAKE ONE TABLET BY MOUTH ONCE DAILY. 90 tablet 3   Multiple Vitamin (MULTIVITAMIN WITH MINERALS) TABS tablet Take 1 tablet by mouth daily. (0800)     rivaroxaban  (XARELTO ) 20 MG TABS tablet TAKE ONE TABLET BY MOUTH EVERY EVENING WITH SUPPER 30 tablet 11   ROCKLATAN  0.02-0.005 % SOLN Apply 0.5 drops to eye at bedtime.     sacubitril -valsartan  (ENTRESTO ) 24-26 MG Take 1 tablet by mouth 2 (two) times daily. 60 tablet 11   sodium zirconium cyclosilicate  (LOKELMA ) 10 g PACK packet Take 10 g by mouth daily. 90  packet 3   triamcinolone  (NASACORT ) 55 MCG/ACT AERO nasal inhaler INSTILL 1 SPRAY IN EACH NOSTRIL ONCE OR TWICE DAILY AS NEEDED. 16.5 Bottle 11   UNABLE TO FIND Med Name: Pro-stat sugar free 32oz  Give (80) ML by Mouth Twice a day     vitamin C  (ASCORBIC ACID ) 500 MG tablet Take 500 mg by mouth in the morning. (0800)     No current facility-administered medications for this encounter.   Wt Readings from Last 3 Encounters:  02/16/24 85.5 kg (188 lb 6.4 oz)  01/26/24 85.3 kg (188 lb)  12/21/23 87.1 kg (192 lb)   BP (!) 124/58   Pulse 68   Ht 6' 2 (1.88 m)   Wt 85.5 kg (188 lb 6.4 oz)   SpO2 98%   BMI 24.19 kg/m   PHYSICAL EXAM: General:  Chronically ill appearing. Ambulated into clinic. Cor: No JVD. Regular rate & rhythm. No murmurs. Lungs: clear Abdomen:  soft, nontender, nondistended. Extremities: 1+ right ankle edema Neuro: alert & orientedx3. Affect pleasant   Assessment/Plan: 1. Atrial fibrillation:  Paroxysmal. Denies breakthrough symptoms. Regular on exam.  - Continue Coreg  12.5 mg bid. - Continue Xarelto  20 mg daily. No bleeding issues. Check CBC next visit. 2. Chronic systolic CHF: Nonischemic cardiomyopathy.  Echo in 6/22 at time of CVA showed EF 30-35%, diffuse hypokinesis.  LHC/RHC in 8/22 showed nonobstructive CAD and normal filling pressures.  Cardiac MRI in 11/22 showed LV EF 38%, RVEF 46%, basal inferolateral mid-wall LGE suggesting possible prior viral myocarditis vs cardiac amyloidosis.  PYP scan was equivocal but likely negative, Invitae gene testing negative for transthyretin mutation, myeloma panel was negative => suspect TTR cardiac amyloidosis is unlikely.  Prior viral myocarditis or a diabetic cardiomyopathy (poor control of blood glucose) are the most likely etiologies.  Echo in 8/24 showed EF 45-50%, normal RV, IVC  normal.  NYHA I-II, mostly limited by orthostatic dizziness.  Volume looks good. Has lasix  to use PRN. - GDMT titration limited by CKD and soft  BP/orthostatic dizziness. Instructed him to increase fluid intake. Would like to avoid scaling back GDMT any further.  - Continue Coreg  12.5 mg bid.  - Continue Entresto  24/26 bid.  He is on daily Lokelma  10 g to control K.  - Now off eplerenone  last few months per Nephrology. Keep off with intermittent hypotension, orthostasis. - No SGLT2 inhibitor with type 1 diabetes. - EF has been out of range for ICD.  Narrow QRS so not CRT candidate.  - Echo today pending 3. Hyperlipidemia: Continue atorvastatin  40 mg daily.  - LDL at goal in 06/25. 4. CAD: Nonobstructive on cath 8/22. No chest pain.  - Continue statin.  - No ASA given Xarelto  use.  5. Type 1 diabetes: Glucose control seems poor - Have recommended seeing endocrinology in the past but patient declined. 6. CKD stage 3: Diabetic nephropathy with a tendency towards hyperkalemia.  - Now following with Nephrology.  - Labs today - No SGLT2i with DM I  We will notify his pharmacy to review furosemide  and eplerenone  from his pill packs.  Follow up 4 months with Dr. Rolan Manuelita Dutch, PA-C 02/16/24

## 2024-02-16 NOTE — Patient Instructions (Signed)
 Medication Changes:  STOP Eplerenone  and Furosemide , we will notify the pharmacy to remove from your pill packs  Lab Work:  Labs done today, your results will be available in MyChart, we will contact you for abnormal readings.   Special Instructions // Education:  Do the following things EVERYDAY: Weigh yourself in the morning before breakfast. Write it down and keep it in a log. Take your medicines as prescribed Eat low salt foods--Limit salt (sodium) to 2000 mg per day.  Stay as active as you can everyday Limit all fluids for the day to less than 2 liters   Follow-Up in: 4 months with Dr Rolan (Feb 2026), **PLEASE CALL OUR OFFICE IN DECEMBER TO SCHEDULE THIS APPOINTMENT   At the [facility], you and your health needs are our priority. We have a designated team specialized in the treatment of Heart Failure. This Care Team includes your primary Heart Failure Specialized Cardiologist (physician), Advanced Practice Providers (APPs- Physician Assistants and Nurse Practitioners), and Pharmacist who all work together to provide you with the care you need, when you need it.   You may see any of the following providers on your designated Care Team at your next follow up:  Dr. Toribio Fuel Dr. Ezra Rolan Dr. Odis Brownie Greig Mosses, NP Caffie Shed, [place] [facility] [place], [place] Beckey Coe, NP Jordan Lee, NP Tinnie Redman, PharmD   Please be sure to bring in all your medications bottles to every appointment.   Need to Contact Us :  If you have any questions or concerns before your next appointment please send us  a message through [place] or call our office at [phone].    TO LEAVE A MESSAGE FOR THE NURSE SELECT OPTION 2, PLEASE LEAVE A MESSAGE INCLUDING: YOUR NAME DATE OF BIRTH CALL BACK NUMBER REASON FOR CALL**this is important as we prioritize the call backs  YOU WILL RECEIVE A CALL BACK THE SAME DAY AS LONG AS YOU CALL BEFORE 4:00  PM

## 2024-02-19 ENCOUNTER — Ambulatory Visit (HOSPITAL_COMMUNITY): Payer: Self-pay | Admitting: Physician Assistant

## 2024-02-19 ENCOUNTER — Ambulatory Visit (HOSPITAL_COMMUNITY): Payer: Self-pay | Admitting: Family Medicine

## 2024-02-20 NOTE — Addendum Note (Signed)
 Encounter addended by: Buell Powell HERO, RN on: 02/20/2024 12:13 PM  Actions taken: Clinical Note Signed

## 2024-02-20 NOTE — Progress Notes (Signed)
 Called [facility] and gave orders to D/C eplerenone  and Furosemide  and remove them from his pill pack

## 2024-03-12 ENCOUNTER — Telehealth: Payer: Self-pay | Admitting: Gastroenterology

## 2024-03-12 NOTE — Telephone Encounter (Addendum)
 Procedure:Colonoscopy/Endoscopy Procedure date: 03/21/24 Procedure location: [facility] Arrival Time: 10:13 am Spoke with the patient Y/N: Yes Any prep concerns? No  Has the patient obtained the prep from the pharmacy ? Yes Do you have a care partner and transportation: Yes Any additional concerns? No

## 2024-03-13 ENCOUNTER — Encounter (HOSPITAL_COMMUNITY): Payer: Self-pay | Admitting: Gastroenterology

## 2024-03-13 NOTE — Progress Notes (Signed)
 Anesthesia Review:  PCP: Greg Robertson  Cardiologist : Greg Robertson  Clearance 10/13/25[place] Collar [name],Greg Robertson 01/29/24  [facility] CHF Clinic- 02/16/24   PPM/ ICD: Device Orders: Rep Notified:  Chest x-ray : EKG :05/23/23  Echo : 02/16/24  MR Card- 02/18/21  Stress test: Cardiac Cath :  2022   Activity level:  Sleep Study/ CPAP : Fasting Blood Sugar :      / Checks Blood Sugar -- times a day:     DM- type1- Freestyle Libre  Hgba1c-12/21/23- 8.4  Novolog  with meals- none am of procedure Tresiba - Take 80% of am dose which is 12 units since pt takes 15 units - PT concerned about glucose dropping low.  Instructed pt at time of preprocedure phone call to contact PCP for preprocedure instructions since MD aware of pt and follow their instructions.  PT to call CPP on 03/13/24 and ask for preprocedrue instructons regarding Tresiba  prior to procedure.  He will inform them of hospital recommendaitons of taking 80% of dose am of procedure since a type 1 DM.  PT voices understanding.    Blood Thinner/ Instructions /Last Dose: ASA / Instructions/ Last Dose :    Xarelto - last dose on 03/18/2024    PT's meds come in a blister pack from [place] and pt unable to identify what each med is   Instructed pt to call [facility] and see if they can tell him  which pt the Carvedilol  is so he can only that one am of procedure.  PT voices understanding.    PT was in assisted living but has returned home.

## 2024-03-18 ENCOUNTER — Other Ambulatory Visit (HOSPITAL_COMMUNITY): Payer: Self-pay | Admitting: Physician Assistant

## 2024-03-20 ENCOUNTER — Ambulatory Visit: Payer: Self-pay

## 2024-03-21 ENCOUNTER — Ambulatory Visit (HOSPITAL_COMMUNITY): Admitting: Anesthesiology

## 2024-03-21 ENCOUNTER — Ambulatory Visit (HOSPITAL_COMMUNITY)
Admission: RE | Admit: 2024-03-21 | Discharge: 2024-03-21 | Disposition: A | Discharge status: Home / Self Care | Attending: Gastroenterology | Admitting: Gastroenterology

## 2024-03-21 ENCOUNTER — Other Ambulatory Visit: Payer: Self-pay

## 2024-03-21 ENCOUNTER — Encounter (HOSPITAL_COMMUNITY): Admission: RE | Disposition: A | Payer: Self-pay | Source: Home / Self Care | Attending: Gastroenterology

## 2024-03-21 ENCOUNTER — Encounter (HOSPITAL_COMMUNITY): Payer: Self-pay | Admitting: Gastroenterology

## 2024-03-21 DIAGNOSIS — Z85038 Personal history of other malignant neoplasm of large intestine: Secondary | ICD-10-CM

## 2024-03-21 DIAGNOSIS — K9189 Other postprocedural complications and disorders of digestive system: Secondary | ICD-10-CM

## 2024-03-21 DIAGNOSIS — K2980 Duodenitis without bleeding: Secondary | ICD-10-CM

## 2024-03-21 DIAGNOSIS — K641 Second degree hemorrhoids: Secondary | ICD-10-CM | POA: Diagnosis not present

## 2024-03-21 DIAGNOSIS — I4891 Unspecified atrial fibrillation: Secondary | ICD-10-CM

## 2024-03-21 DIAGNOSIS — Z87891 Personal history of nicotine dependence: Secondary | ICD-10-CM

## 2024-03-21 DIAGNOSIS — I10 Essential (primary) hypertension: Secondary | ICD-10-CM | POA: Diagnosis not present

## 2024-03-21 DIAGNOSIS — R194 Change in bowel habit: Secondary | ICD-10-CM

## 2024-03-21 DIAGNOSIS — Z15068 Genetic susceptibility to other malignant neoplasm of digestive system: Secondary | ICD-10-CM

## 2024-03-21 DIAGNOSIS — K319 Disease of stomach and duodenum, unspecified: Secondary | ICD-10-CM

## 2024-03-21 DIAGNOSIS — K529 Noninfective gastroenteritis and colitis, unspecified: Secondary | ICD-10-CM | POA: Diagnosis not present

## 2024-03-21 DIAGNOSIS — K259 Gastric ulcer, unspecified as acute or chronic, without hemorrhage or perforation: Secondary | ICD-10-CM | POA: Diagnosis not present

## 2024-03-21 DIAGNOSIS — D132 Benign neoplasm of duodenum: Secondary | ICD-10-CM

## 2024-03-21 DIAGNOSIS — Z1211 Encounter for screening for malignant neoplasm of colon: Secondary | ICD-10-CM

## 2024-03-21 DIAGNOSIS — K209 Esophagitis, unspecified without bleeding: Secondary | ICD-10-CM | POA: Diagnosis not present

## 2024-03-21 DIAGNOSIS — K640 First degree hemorrhoids: Secondary | ICD-10-CM | POA: Diagnosis not present

## 2024-03-21 DIAGNOSIS — K2960 Other gastritis without bleeding: Secondary | ICD-10-CM

## 2024-03-21 HISTORY — PX: COLONOSCOPY: SHX5424

## 2024-03-21 HISTORY — DX: Cerebral infarction, unspecified: I63.9

## 2024-03-21 HISTORY — DX: Chronic kidney disease, unspecified: N18.9

## 2024-03-21 HISTORY — PX: ESOPHAGOGASTRODUODENOSCOPY: SHX5428

## 2024-03-21 HISTORY — DX: Unspecified osteoarthritis, unspecified site: M19.90

## 2024-03-21 LAB — GLUCOSE, CAPILLARY
Glucose-Capillary: 184 mg/dL — ABNORMAL HIGH (ref 70–99)
Glucose-Capillary: 198 mg/dL — ABNORMAL HIGH (ref 70–99)

## 2024-03-21 SURGERY — COLONOSCOPY
Anesthesia: Monitor Anesthesia Care

## 2024-03-21 MED ORDER — PANTOPRAZOLE SODIUM 40 MG PO TBEC: 40.0000 mg | DELAYED_RELEASE_TABLET | Freq: Two times a day (BID) | ORAL | 6 refills | Status: AC

## 2024-03-21 MED ORDER — LIDOCAINE 2% (20 MG/ML) 5 ML SYRINGE
INTRAMUSCULAR | Status: DC | PRN
  Administered 2024-03-21: 40 mg via INTRAVENOUS

## 2024-03-21 MED ORDER — EPHEDRINE SULFATE (PRESSORS) 25 MG/5ML IV SOSY
PREFILLED_SYRINGE | INTRAVENOUS | Status: DC | PRN
  Administered 2024-03-21 (×2): 5 mg via INTRAVENOUS

## 2024-03-21 MED ORDER — SODIUM CHLORIDE 0.9 % IV SOLN
INTRAVENOUS | Status: DC
  Administered 2024-03-21: 500 mL via INTRAVENOUS

## 2024-03-21 MED ORDER — PROPOFOL 10 MG/ML IV BOLUS
INTRAVENOUS | Status: DC | PRN
  Administered 2024-03-21 (×3): 20 mg via INTRAVENOUS

## 2024-03-21 MED ORDER — PROPOFOL 500 MG/50ML IV EMUL
INTRAVENOUS | Status: DC | PRN
  Administered 2024-03-21: 180 ug/kg/min via INTRAVENOUS

## 2024-03-21 NOTE — Op Note (Signed)
 [facility] Patient Name: Greg Robertson Procedure Date: 03/21/2024 MRN: [id number] Attending MD: Aloha Finner , MD, [id number] Date of Birth: 06-15-1950 CSN: [id number] Age: 73 Admit Type: Outpatient Procedure:                Colonoscopy Indications:              High risk colon cancer surveillance: Personal                            history of colon cancer, High risk colon cancer                            surveillance: Personal history of hereditary                            nonpolyposis colorectal cancer (Lynch Syndrome) Providers:                Aloha Finner, MD, Jacquelyn Jaci Pierce,                            RN, Robie Breed, RN, Haskel Chris,                            Technician, Corene Southgate, Technician Referring MD:              Medicines:                Monitored Anesthesia Care Complications:            No immediate complications. Estimated Blood Loss:     Estimated blood loss was minimal. Procedure:                Pre-Anesthesia Assessment:                           - Prior to the procedure, a History and Physical                            was performed, and patient medications and                            allergies were reviewed. The patient's tolerance of                            previous anesthesia was also reviewed. The risks                            and benefits of the procedure and the sedation                            options and risks were discussed with the patient.                            All questions were answered, and informed consent                            was  obtained. Prior Anticoagulants: The patient has                            taken no anticoagulant or antiplatelet agents. ASA                            Grade Assessment: III - A patient with severe                            systemic disease. After reviewing the risks and                            benefits, the patient was deemed in  satisfactory                            condition to undergo the procedure.                           After obtaining informed consent, the colonoscope                            was passed under direct vision. Throughout the                            procedure, the patient's blood pressure, pulse, and                            oxygen saturations were monitored continuously. The                            CF-HQ190L ([id number]) Olympus colonoscope was                            introduced through the anus and advanced to the the                            ileocolonic anastomosis. The colonoscopy was                            performed without difficulty. The patient tolerated                            the procedure. Scope In: 11:41:37 AM Scope Out: 11:55:19 AM Scope Withdrawal Time: 0 hours 10 minutes 1 second  Total Procedure Duration: 0 hours 13 minutes 42 seconds  Findings:      The digital rectal exam findings include hemorrhoids. Pertinent       negatives include no palpable rectal lesions.      There was evidence of a prior functional end-to-end ileo-colonic       anastomosis in the expected transverse colon. This was patent and was       characterized by mild ulceration at the anastomosis. The anastomosis was       traversed with ease (but due to angulation deep intubation not       performed). Biopsies were taken with a  cold forceps for histology from       the anastomosis to rule out any dysplastic change.      The portion of the neo-terminal ileum visualized appeared normal.      Normal mucosa was found in the entire colon otherwise.      Non-bleeding non-thrombosed external and internal hemorrhoids were found       during retroflexion, during perianal exam and during digital exam. The       hemorrhoids were Grade I (internal hemorrhoids that do not prolapse). Impression:               - Hemorrhoids found on digital rectal exam.                           - Patent functional  end-to-end ileo-colonic                            anastomosis, characterized by mild ulceration                            (biopsied).                           - The examined portion of the neoterminal ileum was                            normal.                           - Normal mucosa in the entire examined colon                            otherwise.                           - Non-bleeding non-thrombosed external and internal                            hemorrhoids. Moderate Sedation:      Not Applicable - Patient had care per Anesthesia. Recommendation:           - The patient will be observed post-procedure,                            until all discharge criteria are met.                           - Discharge patient to home.                           - Patient has a contact number available for                            emergencies. The signs and symptoms of potential                            delayed complications were discussed with the  patient. Return to normal activities tomorrow.                            Written discharge instructions were provided to the                            patient.                           - High fiber diet.                           - Use FiberCon 1-2 tablets PO daily.                           - Continue present medications.                           - Await pathology results.                           - Repeat colonoscopy in 1 year for surveillance due                            to underlying Lynch Syndrome.                           - The findings and recommendations were discussed                            with the patient.                           - The findings and recommendations were discussed                            with the designated responsible adult. Procedure Code(s):        --- Professional ---                           [phone], Colonoscopy, flexible; with biopsy, single                             or multiple Diagnosis Code(s):        --- Professional ---                           [phone], Personal history of other malignant                            neoplasm of large intestine                           Z15.09, Genetic susceptibility to other malignant                            neoplasm  Z98.0, Intestinal bypass and anastomosis status                           K64.1, Second degree hemorrhoids CPT copyright 2022 American Medical Association. All rights reserved. The codes documented in this report are preliminary and upon coder review may  be revised to meet current compliance requirements. Aloha Finner, MD 03/21/2024 12:12:38 PM Number of Addenda: 0

## 2024-03-21 NOTE — Transfer of Care (Signed)
 Immediate Anesthesia Transfer of Care Note  Patient: Greg Robertson  Procedure(s) Performed: COLONOSCOPY EGD (ESOPHAGOGASTRODUODENOSCOPY)  Patient Location: PACU  Anesthesia Type:MAC  Level of Consciousness: awake and alert   Airway & Oxygen Therapy: Patient Spontanous Breathing and Patient connected to nasal cannula oxygen  Post-op Assessment: Report given to RN and Post -op Vital signs reviewed and stable  Post vital signs: Reviewed and stable  Last Vitals:  Vitals Value Taken Time  BP 98/43 03/21/24 12:02  Temp    Pulse 57 03/21/24 12:03  Resp 11 03/21/24 12:03  SpO2 99 % 03/21/24 12:03  Vitals shown include unfiled device data.  Last Pain:  Vitals:   03/21/24 1202  TempSrc:   PainSc: 0-No pain         Complications: No notable events documented.

## 2024-03-21 NOTE — Anesthesia Preprocedure Evaluation (Signed)
 Anesthesia Evaluation  Patient identified by MRN, date of birth, ID band Patient awake    Reviewed: Allergy  & Precautions, NPO status , Patient's Chart, lab work & pertinent test results  Airway Mallampati: II  TM Distance: >3 FB Neck ROM: Full    Dental  (+) Dental Advisory Given   Pulmonary COPD, former smoker   breath sounds clear to auscultation       Cardiovascular hypertension, Pt. on medications +CHF  + dysrhythmias Atrial Fibrillation  Rhythm:Regular Rate:Normal  01/2024 Echo IMPRESSIONS    1. Left ventricular ejection fraction, by estimation, is 50 to 55%. Left  ventricular ejection fraction by 3D volume is 53 %. The left ventricle has  low normal function. The left ventricle has no regional wall motion  abnormalities. Left ventricular  diastolic parameters are consistent with Grade I diastolic dysfunction  (impaired relaxation). The average left ventricular global longitudinal  strain is -12.1 %. The global longitudinal strain is abnormal, though with  suboptimal tracking is mildly  improved from reported value.   2. Right ventricular systolic function is normal. The right ventricular  size is normal. Tricuspid regurgitation signal is inadequate for assessing  PA pressure.   3. The mitral valve is normal in structure. Trivial mitral valve  regurgitation.   4. The aortic valve is tricuspid. Aortic valve regurgitation is not  visualized.   5. The inferior vena cava is normal in size with greater than 50%  respiratory variability, suggesting right atrial pressure of 3 mmHg.     Neuro/Psych CVA    GI/Hepatic Neg liver ROS,,,Lynch syndrome   Endo/Other  diabetes, Type 1    Renal/GU CRFRenal disease     Musculoskeletal   Abdominal   Peds  Hematology negative hematology ROS (+)   Anesthesia Other Findings   Reproductive/Obstetrics                              Anesthesia  Physical Anesthesia Plan  ASA: 3  Anesthesia Plan: MAC   Post-op Pain Management: Minimal or no pain anticipated   Induction:   PONV Risk Score and Plan: 1 and Propofol  infusion and Treatment may vary due to age or medical condition  Airway Management Planned: Natural Airway, Nasal Cannula and Simple Face Mask  Additional Equipment:   Intra-op Plan:   Post-operative Plan:   Informed Consent: I have reviewed the patients History and Physical, chart, labs and discussed the procedure including the risks, benefits and alternatives for the proposed anesthesia with the patient or authorized representative who has indicated his/her understanding and acceptance.       Plan Discussed with: CRNA  Anesthesia Plan Comments:          Anesthesia Quick Evaluation

## 2024-03-21 NOTE — H&P (Signed)
 GASTROENTEROLOGY PROCEDURE H&P NOTE   Primary Care Physician: Dettinger, Fonda LABOR, MD  HPI: Greg Robertson is a 73 y.o. 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   Congestive heart failure (CHF) (HCC)    Diabetes mellitus type I (HCC)    Glaucoma    Hypertension    Pneumonia 1979 or 1980   Prostate nodule    Stroke (HCC)    09/2020   affected vision   Substance abuse (HCC)    Past Surgical History:  Procedure Laterality Date   broken left shoulder blade and collar bone     COLONOSCOPY WITH PROPOFOL  N/A 04/29/2021   Procedure: COLONOSCOPY WITH PROPOFOL ;  Surgeon: Teressa Toribio SQUIBB, MD;  Location: [facility];  Service: Endoscopy;  Laterality: N/A;   ESOPHAGOGASTRODUODENOSCOPY (EGD) WITH PROPOFOL  N/A 04/29/2021   Procedure: ESOPHAGOGASTRODUODENOSCOPY (EGD) WITH PROPOFOL ;  Surgeon: Teressa Toribio SQUIBB, MD;  Location: [facility];  Service: Endoscopy;  Laterality: N/A;   FINGER SURGERY  04/19/2007   right middle   HEMOSTASIS CLIP PLACEMENT  04/29/2021   Procedure: HEMOSTASIS CLIP PLACEMENT;  Surgeon: Teressa Toribio SQUIBB, MD;  Location: [facility];  Service: Endoscopy;;   LAPAROSCOPIC ASSISTED 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     NASAL FRACTURE SURGERY  04/18/1966   ORIF ANKLE FRACTURE Right 12/08/2022   Procedure: OPEN REDUCTION INTERNAL FIXATION (ORIF) ANKLE FRACTURE;  Surgeon: Georgina Ozell LABOR, MD;  Location: [facility];  Service: Orthopedics;  Laterality: Right;   POLYPECTOMY  04/29/2021   Procedure: POLYPECTOMY;  Surgeon: Teressa Toribio SQUIBB, MD;  Location: [facility];  Service: Endoscopy;;   PORT-A-CATH REMOVAL     PORTACATH PLACEMENT  07/01/2011   Procedure: INSERTION PORT-A-CATH;  Surgeon: Elspeth KYM Schultze, MD;  Location: [facility];  Service: General;  Laterality: Left;  Insertion of  Port-A-Catheter Left Internal Jugular   RIGHT/LEFT HEART CATH AND CORONARY ANGIOGRAPHY N/A 12/08/2020   Procedure: RIGHT/LEFT HEART CATH AND CORONARY ANGIOGRAPHY;  Surgeon: Rolan Ezra RAMAN, MD;  Location: [facility];  Service: Cardiovascular;  Laterality: N/A;   TONSILLECTOMY  1957 - approximate   No current facility-administered medications for this encounter.   No current facility-administered medications for this encounter. Allergies  Allergen Reactions   Oxycodone  Nausea And Vomiting   Family History  Problem Relation Age of Onset   Breast cancer Mother    Pancreatitis Mother        intestinal adhesions   Insomnia Mother    Colon polyps Father    Lung cancer Father    Diabetes Father    Prostate cancer Father    Colon cancer Neg Hx    Rectal cancer Neg Hx    Esophageal cancer Neg Hx    Inflammatory bowel disease Neg Hx    Liver disease Neg Hx    Pancreatic cancer Neg Hx    Stomach cancer Neg Hx    Social History   Socioeconomic History   Marital status: Single    Spouse name: Not on file   Number of children: 0   Years of education: 16   Highest education level: Bachelor's degree (e.g., BA, AB, BS)  Occupational History   Occupation: Retired    Comment: airline pilot  Tobacco Use   Smoking status: Former    Current  packs/day: 0.00    Average packs/day: 1 pack/day for 15.0 years (15.0 ttl pk-yrs)    Types: Cigarettes    Start date: 04/18/1978    Quit date: 04/18/1993    Years since quitting: 30.9   Smokeless tobacco: Never   Tobacco comments:    marijuana every night   Vaping Use   Vaping status: Never Used  Substance and Sexual Activity   Alcohol use: Not Currently    Alcohol/week: 5.0 standard drinks of alcohol    Types: 3 Cans of beer, 2 Shots of liquor per week    Comment: rare   Drug use: Not Currently    Types: Marijuana    Comment: once a night marijuana   Sexual activity: Not Currently  Other Topics Concern   Not on file  Social History  Narrative   Pt lives alone    R handed   Caffeine: no coffee/or soda. Has sweet tea for dinner.    Social Drivers of Corporate Investment Banker Strain: Low Risk  (02/24/2023)   Overall Financial Resource Strain (CARDIA)    Difficulty of Paying Living Expenses: Not hard at all  Food Insecurity: No Food Insecurity (02/24/2023)   Hunger Vital Sign    Worried About Running Out of Food in the Last Year: Never true    Ran Out of Food in the Last Year: Never true  Transportation Needs: No Transportation Needs (02/24/2023)   PRAPARE - Administrator, Civil Service (Medical): No    Lack of Transportation (Non-Medical): No  Physical Activity: Inactive (02/24/2023)   Exercise Vital Sign    Days of Exercise per Week: 0 days    Minutes of Exercise per Session: 0 min  Stress: No Stress Concern Present (02/24/2023)   [employer] of Occupational Health - Occupational Stress Questionnaire    Feeling of Stress : Not at all  Social Connections: Socially Isolated (02/24/2023)   Social Connection and Isolation Panel    Frequency of Communication with Friends and Family: More than three times a week    Frequency of Social Gatherings with Friends and Family: More than three times a week    Attends Religious Services: Never    Database Administrator or Organizations: No    Attends Banker Meetings: Never    Marital Status: Never married  Intimate Partner Violence: Not At Risk (02/24/2023)   Humiliation, Afraid, Rape, and Kick questionnaire    Fear of Current or Ex-Partner: No    Emotionally Abused: No    Physically Abused: No    Sexually Abused: No    Physical Exam: There were no vitals filed for this visit. There is no height or weight on file to calculate BMI. GEN: NAD EYE: Sclerae anicteric ENT: MMM CV: Non-tachycardic GI: Soft, NT/ND NEURO:  Alert & Oriented x 3  Lab Results: No results for input(s): WBC, HGB, HCT, PLT in the last 72 hours. BMET No  results for input(s): NA, K, CL, CO2, GLUCOSE, BUN, CREATININE, CALCIUM  in the last 72 hours. LFT No results for input(s): PROT, ALBUMIN , AST, ALT, ALKPHOS, BILITOT, BILIDIR, IBILI in the last 72 hours. PT/INR No results for input(s): LABPROT, INR in the last 72 hours.   Impression / Plan: This is a 73 y.o.male who presents for EGD/Colonoscopy for underlying Lynch syndrome and history of duodenal adenoma and colon polyps and changes in bowel habits.  The risks and benefits of endoscopic evaluation/treatment were discussed with the patient and/or family;  these include but are not limited to the risk of perforation, infection, bleeding, missed lesions, lack of diagnosis, severe illness requiring hospitalization, as well as anesthesia and sedation related illnesses.  The patient's history has been reviewed, patient examined, no change in status, and deemed stable for procedure.  The patient and/or family was provided an opportunity to ask questions and all were answered.  The patient and/or family is agreeable to proceed.    Aloha Finner, MD [facility] Office # [phone]

## 2024-03-21 NOTE — Discharge Instructions (Signed)

## 2024-03-21 NOTE — Anesthesia Postprocedure Evaluation (Signed)
 Anesthesia Post Note  Patient: Greg Robertson  Procedure(s) Performed: COLONOSCOPY EGD (ESOPHAGOGASTRODUODENOSCOPY)     Patient location during evaluation: PACU Anesthesia Type: MAC Level of consciousness: awake and alert Pain management: pain level controlled Vital Signs Assessment: post-procedure vital signs reviewed and stable Respiratory status: spontaneous breathing, nonlabored ventilation, respiratory function stable and patient connected to nasal cannula oxygen Cardiovascular status: stable and blood pressure returned to baseline Postop Assessment: no apparent nausea or vomiting Anesthetic complications: no   No notable events documented.  Last Vitals:  Vitals:   03/21/24 1231 03/21/24 1240  BP: (!) 145/65 (!) 161/71  Pulse:    Resp: 11 13  Temp:  (!) 36.2 C  SpO2: 97% 96%    Last Pain:  Vitals:   03/21/24 1240  TempSrc: Temporal  PainSc: 0-No pain                 [name]

## 2024-03-21 NOTE — Op Note (Signed)
 [facility] Patient Name: Greg Robertson Procedure Date: 03/21/2024 MRN: [id number] Attending MD: Aloha Finner , MD, [id number] Date of Birth: 12-Nov-1950 CSN: [id number] Age: 73 Admit Type: Outpatient Procedure:                Upper GI endoscopy Indications:              Hereditary nonpolyposis colorectal cancer (Lynch                            Syndrome) Providers:                Aloha Finner, MD, Jacquelyn Jaci Pierce,                            RN, Haskel Chris, Technician Referring MD:              Medicines:                Monitored Anesthesia Care Complications:            No immediate complications. Estimated Blood Loss:     Estimated blood loss was minimal. Procedure:                Pre-Anesthesia Assessment:                           - Prior to the procedure, a History and Physical                            was performed, and patient medications and                            allergies were reviewed. The patient's tolerance of                            previous anesthesia was also reviewed. The risks                            and benefits of the procedure and the sedation                            options and risks were discussed with the patient.                            All questions were answered, and informed consent                            was obtained. Prior Anticoagulants: The patient has                            taken no anticoagulant or antiplatelet agents. ASA                            Grade Assessment: III - A patient with severe                            systemic  disease. After reviewing the risks and                            benefits, the patient was deemed in satisfactory                            condition to undergo the procedure.                           After obtaining informed consent, the endoscope was                            passed under direct vision. Throughout the                             procedure, the patient's blood pressure, pulse, and                            oxygen saturations were monitored continuously. The                            [id number]-H190 ([id number]) Olympus endoscope was introduced                            through the mouth, and advanced to the second part                            of duodenum. The upper GI endoscopy was                            accomplished without difficulty. The patient                            tolerated the procedure. Scope In: Scope Out: Findings:      No gross lesions were noted in the proximal esophagus and in the mid       esophagus.      LA Grade A (one or more mucosal breaks less than 5 mm, not extending       between tops of 2 mucosal folds) esophagitis with no bleeding was found       in the distal esophagus.      A 1 cm hiatal hernia was present.      One non-bleeding linear gastric ulcer with a clean ulcer base (Forrest       Class III) was found on the greater curvature of the stomach. The lesion       was 15 mm in largest dimension.      Patchy mildly erythematous mucosa without bleeding was found in the       entire examined stomach. Biopsies were taken with a cold forceps for       histology and Helicobacter pylori testing.      Localized moderate inflammation characterized by erosions and erythema       was found in the duodenal bulb and in the duodenal sweep. Biopsies were       taken with a cold forceps for histology and Helicobacter pylori testing.  A medium post polypectomy scar with tattoo was found in the second       portion of the duodenum. The scar tissue was healthy in appearance.      No other gross lesions were noted in the second portion of the duodenum.      The major papilla was normal. Impression:               - No gross lesions in the proximal esophagus and in                            the mid esophagus.                           - LA Grade A esophagitis with no bleeding found                             distally.                           - 1 cm hiatal hernia.                           - Non-bleeding gastric ulcer with a clean ulcer                            base (Forrest Class III) found in the body.                            Erythematous mucosa in the stomach throughout.                            Biopsied.                           - Duodenitis in bulb/sweep. Biopsied.                           - Duodenal scar in D2 with tattoo noted (no                            evidence of recurrent polyp).                           - No other gross lesions in the second portion of                            the duodenum.                           - Normal major papilla. Moderate Sedation:      Not Applicable - Patient had care per Anesthesia. Recommendation:           - The patient will be observed post-procedure,                            until all discharge criteria are met.                           -  Discharge patient to home.                           - Patient has a contact number available for                            emergencies. The signs and symptoms of potential                            delayed complications were discussed with the                            patient. Return to normal activities tomorrow.                            Written discharge instructions were provided to the                            patient.                           - Advance diet as tolerated.                           - Start Protonix 40 mg twice daily for 2 months                            then once daily thereafter.                           - Observe patient's clinical course.                           - Await pathology results.                           - Repeat upper endoscopy in 4 months to check                            healing of gastric ulcer.                           - Otherwise, will not need another Upper Endoscopy                            for 2-4 years in setting of  underlying Lynch                            Syndrome.                           - The findings and recommendations were discussed                            with the patient.                           -  The findings and recommendations were discussed                            with the patient's family. Procedure Code(s):        --- Professional ---                           [phone], Esophagogastroduodenoscopy, flexible,                            transoral; with biopsy, single or multiple Diagnosis Code(s):        --- Professional ---                           K20.90, Esophagitis, unspecified without bleeding                           K44.9, Diaphragmatic hernia without obstruction or                            gangrene                           K25.9, Gastric ulcer, unspecified as acute or                            chronic, without hemorrhage or perforation                           K31.89, Other diseases of stomach and duodenum                           K29.80, Duodenitis without bleeding                           C18.9, Malignant neoplasm of colon, unspecified                           Z15.09, Genetic susceptibility to other malignant                            neoplasm CPT copyright 2022 American Medical Association. All rights reserved. The codes documented in this report are preliminary and upon coder review may  be revised to meet current compliance requirements. Aloha Finner, MD 03/21/2024 12:07:20 PM Number of Addenda: 0

## 2024-03-22 ENCOUNTER — Encounter (HOSPITAL_COMMUNITY): Payer: Self-pay | Admitting: Gastroenterology

## 2024-03-22 ENCOUNTER — Ambulatory Visit: Payer: Self-pay | Admitting: Family Medicine

## 2024-03-22 LAB — SURGICAL PATHOLOGY

## 2024-03-24 ENCOUNTER — Ambulatory Visit: Payer: Self-pay | Admitting: Gastroenterology

## 2024-03-26 LAB — OPHTHALMOLOGY REPORT-SCANNED

## 2024-04-04 ENCOUNTER — Telehealth: Payer: Self-pay

## 2024-04-04 ENCOUNTER — Ambulatory Visit (INDEPENDENT_AMBULATORY_CARE_PROVIDER_SITE_OTHER): Admitting: Family Medicine

## 2024-04-04 VITALS — BP 99/63 | HR 72 | Ht 74.0 in | Wt 191.0 lb

## 2024-04-04 DIAGNOSIS — E10319 Type 1 diabetes mellitus with unspecified diabetic retinopathy without macular edema: Secondary | ICD-10-CM

## 2024-04-04 DIAGNOSIS — E1069 Type 1 diabetes mellitus with other specified complication: Secondary | ICD-10-CM

## 2024-04-04 DIAGNOSIS — I152 Hypertension secondary to endocrine disorders: Secondary | ICD-10-CM

## 2024-04-04 DIAGNOSIS — E782 Mixed hyperlipidemia: Secondary | ICD-10-CM | POA: Diagnosis not present

## 2024-04-04 DIAGNOSIS — Z794 Long term (current) use of insulin: Secondary | ICD-10-CM | POA: Diagnosis not present

## 2024-04-04 DIAGNOSIS — E1159 Type 2 diabetes mellitus with other circulatory complications: Secondary | ICD-10-CM

## 2024-04-04 DIAGNOSIS — R351 Nocturia: Secondary | ICD-10-CM

## 2024-04-04 DIAGNOSIS — I5042 Chronic combined systolic (congestive) and diastolic (congestive) heart failure: Secondary | ICD-10-CM

## 2024-04-04 DIAGNOSIS — E785 Hyperlipidemia, unspecified: Secondary | ICD-10-CM | POA: Diagnosis not present

## 2024-04-04 DIAGNOSIS — E1059 Type 1 diabetes mellitus with other circulatory complications: Secondary | ICD-10-CM

## 2024-04-04 LAB — BAYER DCA HB A1C WAIVED: HB A1C (BAYER DCA - WAIVED): 8.1 % — ABNORMAL HIGH (ref 4.8–5.6)

## 2024-04-04 NOTE — Progress Notes (Signed)
 BP 99/63   Pulse 72   Ht 6' 2 (1.88 m)   Wt 191 lb (86.6 kg)   SpO2 100%   BMI 24.52 kg/m    Subjective:   Patient ID: Greg Robertson, male    DOB: 09-Dec-1950, 73 y.o.   MRN: [id number]  HPI: Greg Robertson is a 73 y.o. male presenting on 04/04/2024 for Medical Management of Chronic Issues and Diabetes   Discussed the use of AI scribe software for clinical note transcription with the patient, who gave verbal consent to proceed.  History of Present Illness   [name] is a 73 year old male with diabetes who presents for a recheck of his blood sugar levels.  Blood glucose fluctuations - Blood glucose levels were previously stable, typically in the upper 130s to 140s mg/dL. - Recent fluctuations in blood glucose, particularly after meals and snacks. - Elevated blood glucose overnight after consuming a half sandwich as a snack without administering insulin . - Morning blood glucose levels prior to Thanksgiving were typically 120 to 160 mg/dL. - After Thanksgiving, blood glucose levels increased, especially from December 6th to 10th, with consistently high readings. - Attributes some fluctuations to dietary changes during the holiday period.  Hypoglycemia - Experiences low blood glucose levels, particularly before dinner. - Frequent hypoglycemic episodes prompted adjustment of insulin  regimen.  Insulin  regimen adjustments - Previously administered one unit of Novolog  for every six grams of carbohydrates. - Adjusted regimen to one unit for every seven grams of carbohydrates due to frequent hypoglycemia. - Continues to experience low blood glucose before dinner and high levels overnight after snacks despite adjustments.  Cardiac and renal status - Under care of a cardiologist for congestive heart failure. - Cardiac function has improved per cardiology evaluation. - Renal function has also improved.          Relevant past medical, surgical, family and  social history reviewed and updated as indicated. Interim medical history since our last visit reviewed. Allergies and medications reviewed and updated.  Review of Systems  Constitutional:  Negative for chills and fever.  Eyes:  Negative for visual disturbance.  Respiratory:  Negative for shortness of breath and wheezing.   Cardiovascular:  Negative for chest pain and leg swelling.  Musculoskeletal:  Negative for back pain and gait problem.  Skin:  Negative for rash.  Neurological:  Negative for dizziness.  All other systems reviewed and are negative.   Per HPI unless specifically indicated above   Allergies as of 04/04/2024       Reactions   Oxycodone  Nausea And Vomiting        Medication List        Accurate as of April 04, 2024  1:58 PM. If you have any questions, ask your nurse or doctor.          ascorbic acid  500 MG tablet Commonly known as: VITAMIN C  Take 500 mg by mouth in the morning. (0800)   atorvastatin  40 MG tablet Commonly known as: LIPITOR Take 1 tablet (40 mg total) by mouth daily.   carvedilol  12.5 MG tablet Commonly known as: COREG  Take 1 tablet (12.5 mg total) by mouth 2 (two) times daily with a meal. Dose change-updated dispill   Cholecalciferol  50 MCG (2000 UT) Tabs Take 2 tablets by mouth daily.   Entresto  24-26 MG Generic drug: sacubitril -valsartan  Take 1 tablet by mouth 2 (two) times daily.   FreeStyle Libre 3 Plus Sensor Misc Change sensor every 15 days.  FreeStyle Libre 3 Reader Devi 1 each by Does not apply route 4 (four) times daily.   GARLIC PO Take 1 capsule by mouth daily.   glucose blood test strip Use as instructed   Lancets Misc 1 Lancet by Does not apply route 3 (three) times daily.   levocetirizine 5 MG tablet Commonly known as: XYZAL  TAKE ONE TABLET BY MOUTH ONCE DAILY.   Lokelma  10 g Pack packet Generic drug: sodium zirconium cyclosilicate  Take 10 g by mouth daily.   multivitamin with minerals Tabs  tablet Take 1 tablet by mouth daily. (0800)   NovoLOG  FlexPen 100 UNIT/ML FlexPen Generic drug: insulin  aspart Inject 20-30 Units into the skin 3 (three) times daily with meals. What changed:  how much to take additional instructions   pantoprazole  40 MG tablet Commonly known as: Protonix  Take 1 tablet (40 mg total) by mouth 2 (two) times daily before a meal.   Pro-Stat Liqd Take 8 mLs by mouth daily.   rivaroxaban  20 MG Tabs tablet Commonly known as: Xarelto  TAKE ONE TABLET BY MOUTH EVERY EVENING WITH SUPPER   Rocklatan 0.02-0.005 % Soln Generic drug: Netarsudil-Latanoprost  Apply 0.5 drops to eye at bedtime.   Sure Comfort Pen Needles 32G X 4 MM Misc Generic drug: Insulin  Pen Needle UAD with insulin  DX E10.9   Tresiba  FlexTouch 100 UNIT/ML FlexTouch Pen Generic drug: insulin  degludec Inject 25 Units into the skin daily. What changed: how much to take   triamcinolone  55 MCG/ACT Aero nasal inhaler Commonly known as: NASACORT  INSTILL 1 SPRAY IN EACH NOSTRIL ONCE OR TWICE DAILY AS NEEDED.   UNABLE TO FIND Med Name: Pro-stat sugar free 32oz  Give (80) ML by Mouth Twice a day         Objective:   BP 99/63   Pulse 72   Ht 6' 2 (1.88 m)   Wt 191 lb (86.6 kg)   SpO2 100%   BMI 24.52 kg/m   Wt Readings from Last 3 Encounters:  04/04/24 191 lb (86.6 kg)  03/21/24 182 lb (82.6 kg)  02/16/24 188 lb 6.4 oz (85.5 kg)    Physical Exam Vitals and nursing note reviewed.  Constitutional:      Appearance: Normal appearance.  Neurological:     Mental Status: He is alert.    Physical Exam   CHEST: Lungs clear to auscultation. CARDIOVASCULAR: Regular heart sounds, no murmurs.       Results for orders placed or performed in visit on 03/26/24  OPHTHALMOLOGY REPORT-SCANNED   Collection Time: 03/26/24  2:39 PM  Result Value Ref Range   HM Diabetic Eye Exam Retinopathy (A) No Retinopathy   A Comment      Assessment & Plan:   Problem List Items Addressed This  Visit       Cardiovascular and Mediastinum   Hypertension associated with diabetes (HCC)   Relevant Orders   Bayer DCA Hb A1c Waived   CBC with Differential/Platelet   CMP14+EGFR   Lipid panel   TSH   Chronic CHF (congestive heart failure) (HCC)     Endocrine   DM type 1 (diabetes mellitus, type 1) (HCC) (Chronic)   Diabetic retinopathy (HCC)   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 Recent adjustments show partial improvement. - Adjust insulin : reduce lunchtime coverage, increase snack coverage. - Ensure insulin  coverage for carbohydrate-containing snacks. A1c is 8.1 today.  Chronic combined systolic and diastolic congestive heart failure Improved heart function and renal function with lower blood pressure. - Continue monitoring heart and renal function with cardiology team.          Follow up plan: Return in about 3 months (around 07/03/2024), or if symptoms worsen or fail to improve, for Diabetes recheck.  Counseling provided for all of the vaccine components Orders Placed This Encounter  Procedures   Bayer DCA Hb A1c Waived   CBC with Differential/Platelet   CMP14+EGFR   Lipid panel   TSH   PSA, total and free    Fonda Levins, MD [facility] 04/04/2024, 1:58 PM

## 2024-04-04 NOTE — Telephone Encounter (Signed)
 Pt seen Dr Dettinger in the office today. He states that he received a text message that stated his Herlene will no longer be covered. The pt wanted to make Mliss aware and to see if she has heard anything about this.

## 2024-04-05 ENCOUNTER — Ambulatory Visit: Payer: Self-pay | Admitting: Family Medicine

## 2024-04-05 LAB — CMP14+EGFR
ALT: 12 IU/L (ref 0–44)
AST: 18 IU/L (ref 0–40)
Albumin: 4.2 g/dL (ref 3.8–4.8)
Alkaline Phosphatase: 102 IU/L (ref 47–123)
BUN/Creatinine Ratio: 18 (ref 10–24)
BUN: 23 mg/dL (ref 8–27)
Bilirubin Total: 0.4 mg/dL (ref 0.0–1.2)
CO2: 26 mmol/L (ref 20–29)
Calcium: 9.4 mg/dL (ref 8.6–10.2)
Chloride: 104 mmol/L (ref 96–106)
Creatinine, Ser: 1.31 mg/dL — ABNORMAL HIGH (ref 0.76–1.27)
Globulin, Total: 1.8 g/dL (ref 1.5–4.5)
Glucose: 99 mg/dL (ref 70–99)
Potassium: 4.8 mmol/L (ref 3.5–5.2)
Sodium: 142 mmol/L (ref 134–144)
Total Protein: 6 g/dL (ref 6.0–8.5)
eGFR: 57 mL/min/1.73 — ABNORMAL LOW

## 2024-04-05 LAB — CBC WITH DIFFERENTIAL/PLATELET
Basophils Absolute: 0 x10E3/uL (ref 0.0–0.2)
Basos: 1 %
EOS (ABSOLUTE): 0.4 x10E3/uL (ref 0.0–0.4)
Eos: 6 %
Hematocrit: 39.6 % (ref 37.5–51.0)
Hemoglobin: 13 g/dL (ref 13.0–17.7)
Immature Grans (Abs): 0 x10E3/uL (ref 0.0–0.1)
Immature Granulocytes: 0 %
Lymphocytes Absolute: 2 x10E3/uL (ref 0.7–3.1)
Lymphs: 28 %
MCH: 29.9 pg (ref 26.6–33.0)
MCHC: 32.8 g/dL (ref 31.5–35.7)
MCV: 91 fL (ref 79–97)
Monocytes Absolute: 0.8 x10E3/uL (ref 0.1–0.9)
Monocytes: 11 %
Neutrophils Absolute: 3.9 x10E3/uL (ref 1.4–7.0)
Neutrophils: 54 %
Platelets: 169 x10E3/uL (ref 150–450)
RBC: 4.35 x10E6/uL (ref 4.14–5.80)
RDW: 13.1 % (ref 11.6–15.4)
WBC: 7.1 x10E3/uL (ref 3.4–10.8)

## 2024-04-05 LAB — LIPID PANEL
Chol/HDL Ratio: 2.3 ratio (ref 0.0–5.0)
Cholesterol, Total: 133 mg/dL (ref 100–199)
HDL: 59 mg/dL
LDL Chol Calc (NIH): 58 mg/dL (ref 0–99)
Triglycerides: 80 mg/dL (ref 0–149)
VLDL Cholesterol Cal: 16 mg/dL (ref 5–40)

## 2024-04-05 LAB — PSA, TOTAL AND FREE
PSA, Free Pct: 24.8 %
PSA, Free: 0.77 ng/mL
Prostate Specific Ag, Serum: 3.1 ng/mL (ref 0.0–4.0)

## 2024-04-05 LAB — TSH: TSH: 3.62 u[IU]/mL (ref 0.450–4.500)

## 2024-04-08 NOTE — Telephone Encounter (Unsigned)
 Copied from CRM #[id number]. Topic: Clinical - Lab/Test Results >> Apr 08, 2024  9:37 AM Greg Robertson wrote: Reason for CRM: Patient is calling in for lab results, relayed to patient. Patient understood and had no further questions.

## 2024-05-07 ENCOUNTER — Ambulatory Visit

## 2024-05-07 VITALS — BP 90/47 | HR 80 | Temp 98.3°F | Ht 74.0 in | Wt 191.0 lb

## 2024-05-07 DIAGNOSIS — Z0000 Encounter for general adult medical examination without abnormal findings: Secondary | ICD-10-CM | POA: Diagnosis not present

## 2024-05-07 NOTE — Progress Notes (Cosign Needed Addendum)
 "  Chief Complaint  Patient presents with   Medicare Wellness     Subjective:   Greg Robertson is a 74 y.o. male who presents for a Medicare Annual Wellness Visit.  Visit info / Clinical Intake: Medicare Wellness Visit Type:: Subsequent Annual Wellness Visit Persons participating in visit and providing information:: patient Medicare Wellness Visit Mode:: In-person (required for WTM) Interpreter Needed?: No Pre-visit prep was completed: yes AWV questionnaire completed by patient prior to visit?: no Living arrangements:: (!) lives alone Patient's Overall Health Status Rating: very good Typical amount of pain: none Does pain affect daily life?: no Are you currently prescribed opioids?: no  Dietary Habits and Nutritional Risks How many meals a day?: 3 Eats fruit and vegetables daily?: yes Most meals are obtained by: preparing own meals In the last 2 weeks, have you had any of the following?: none Diabetic:: (!) yes Any non-healing wounds?: no How often do you check your BS?: continuous glucose monitor Would you like to be referred to a Nutritionist or for Diabetic Management? : no  Functional Status Activities of Daily Living (to include ambulation/medication): Independent Ambulation: Independent Medication Administration: Independent Home Management (perform basic housework or laundry): Independent Manage your own finances?: yes Primary transportation is: family / friends Concerns about vision?: no *vision screening is required for WTM* (last ov 2mos ago Dr. Elner, [facility]) Concerns about hearing?: no  Fall Screening Falls in the past year?: 0 Number of falls in past year: 0 Was there an injury with Fall?: 0 Fall Risk Category Calculator: 0 Patient Fall Risk Level: Low Fall Risk  Fall Risk Patient at Risk for Falls Due to: No Fall Risks Fall risk Follow up: Falls evaluation completed; Education provided  Home and Transportation Safety: All rugs have non-skid  backing?: yes All stairs or steps have railings?: yes Grab bars in the bathtub or shower?: (!) no Have non-skid surface in bathtub or shower?: yes Good home lighting?: yes Regular seat belt use?: yes Hospital stays in the last year:: no  Cognitive Assessment Difficulty concentrating, remembering, or making decisions? : no Will 6CIT or Mini Cog be Completed: yes What year is it?: 0 points What month is it?: 0 points Give patient an address phrase to remember (5 components): [address] [place] About what time is it?: 0 points Count backwards from 20 to 1: 0 points Say the months of the year in reverse: 0 points Repeat the address phrase from earlier: 0 points 6 CIT Score: 0 points  Advance Directives (For Healthcare) Does Patient Have a Medical Advance Directive?: No Does patient want to make changes to medical advance directive?: No - Patient declined Type of Advance Directive: Living will; Healthcare Power of Attorney Copy of Healthcare Power of Attorney in Chart?: No - copy requested Copy of Living Will in Chart?: No - copy requested Would patient like information on creating a medical advance directive?: No - Patient declined  Reviewed/Updated  Reviewed/Updated: Reviewed All (Medical, Surgical, Family, Medications, Allergies, Care Teams, Patient Goals); Medical History; Surgical History; Family History; Medications; Allergies; Care Teams; Patient Goals    Allergies (verified) Oxycodone    Current Medications (verified) Outpatient Encounter Medications as of 05/07/2024  Medication Sig   Amino Acids-Protein Hydrolys (PRO-STAT) LIQD Take 8 mLs by mouth daily.   atorvastatin  (LIPITOR) 40 MG tablet Take 1 tablet (40 mg total) by mouth daily.   carvedilol  (COREG ) 12.5 MG tablet Take 1 tablet (12.5 mg total) by mouth 2 (two) times daily with a  meal. Dose change-updated dispill   Cholecalciferol  50 MCG (2000 UT) TABS Take 2 tablets by mouth daily.   Continuous Glucose Receiver  (FREESTYLE LIBRE 3 READER) DEVI 1 each by Does not apply route 4 (four) times daily.   Continuous Glucose Sensor (FREESTYLE LIBRE 3 PLUS SENSOR) MISC Change sensor every 15 days.   GARLIC PO Take 1 capsule by mouth daily.   glucose blood test strip Use as instructed   insulin  aspart (NOVOLOG  FLEXPEN) 100 UNIT/ML FlexPen Inject 20-30 Units into the skin 3 (three) times daily with meals. (Patient taking differently: Inject 8-12 Units into the skin 3 (three) times daily with meals. 10 units with breakfast and lunch; 14 units with dinner)   insulin  degludec (TRESIBA  FLEXTOUCH) 100 UNIT/ML FlexTouch Pen Inject 25 Units into the skin daily. (Patient taking differently: Inject 16 Units into the skin daily.)   Insulin  Pen Needle (SURE COMFORT PEN NEEDLES) 32G X 4 MM MISC UAD with insulin  DX E10.9   Lancets MISC 1 Lancet by Does not apply route 3 (three) times daily.   levocetirizine (XYZAL ) 5 MG tablet TAKE ONE TABLET BY MOUTH ONCE DAILY.   Multiple Vitamin (MULTIVITAMIN WITH MINERALS) TABS tablet Take 1 tablet by mouth daily. (0800)   pantoprazole  (PROTONIX ) 40 MG tablet Take 1 tablet (40 mg total) by mouth 2 (two) times daily before a meal.   rivaroxaban  (XARELTO ) 20 MG TABS tablet TAKE ONE TABLET BY MOUTH EVERY EVENING WITH SUPPER   ROCKLATAN 0.02-0.005 % SOLN Apply 0.5 drops to eye at bedtime.   sacubitril -valsartan  (ENTRESTO ) 24-26 MG Take 1 tablet by mouth 2 (two) times daily.   sodium zirconium cyclosilicate  (LOKELMA ) 10 g PACK packet Take 10 g by mouth daily.   triamcinolone  (NASACORT ) 55 MCG/ACT AERO nasal inhaler INSTILL 1 SPRAY IN EACH NOSTRIL ONCE OR TWICE DAILY AS NEEDED.   UNABLE TO FIND Med Name: Pro-stat sugar free 32oz  Give (80) ML by Mouth Twice a day   vitamin C  (ASCORBIC ACID ) 500 MG tablet Take 500 mg by mouth in the morning. (0800)   No facility-administered encounter medications on file as of 05/07/2024.    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   Congestive heart failure (CHF) (HCC)    Diabetes mellitus type I (HCC)    Glaucoma    Hypertension    Pneumonia 1979 or 1980   Prostate nodule    Stroke (HCC)    09/2020   affected vision   Substance abuse (HCC)    Past Surgical History:  Procedure Laterality Date   broken left shoulder blade and collar bone     COLONOSCOPY N/A 03/21/2024   Procedure: COLONOSCOPY;  Surgeon: Wilhelmenia Aloha Raddle., MD;  Location: [facility];  Service: Gastroenterology;  Laterality: N/A;   COLONOSCOPY WITH PROPOFOL  N/A 04/29/2021   Procedure: COLONOSCOPY WITH PROPOFOL ;  Surgeon: Teressa Toribio SQUIBB, MD;  Location: [facility];  Service: Endoscopy;  Laterality: N/A;   ESOPHAGOGASTRODUODENOSCOPY N/A 03/21/2024   Procedure: EGD (ESOPHAGOGASTRODUODENOSCOPY);  Surgeon: Wilhelmenia Aloha Raddle., MD;  Location: [facility];  Service: Gastroenterology;  Laterality: N/A;   ESOPHAGOGASTRODUODENOSCOPY (EGD) WITH PROPOFOL  N/A 04/29/2021   Procedure: ESOPHAGOGASTRODUODENOSCOPY (EGD) WITH PROPOFOL ;  Surgeon: Teressa Toribio SQUIBB, MD;  Location: [facility];  Service: Endoscopy;  Laterality: N/A;  FINGER SURGERY  04/19/2007   right middle   HEMOSTASIS CLIP PLACEMENT  04/29/2021   Procedure: HEMOSTASIS CLIP PLACEMENT;  Surgeon: Teressa Toribio SQUIBB, MD;  Location: [facility];  Service: Endoscopy;;   LAPAROSCOPIC ASSISTED 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     NASAL FRACTURE SURGERY  04/18/1966   ORIF ANKLE FRACTURE Right 12/08/2022   Procedure: OPEN REDUCTION INTERNAL FIXATION (ORIF) ANKLE FRACTURE;  Surgeon: Georgina Ozell LABOR, MD;  Location: [facility];  Service: Orthopedics;  Laterality: Right;   POLYPECTOMY  04/29/2021   Procedure: POLYPECTOMY;  Surgeon: Teressa Toribio SQUIBB, MD;  Location: [facility];  Service: Endoscopy;;   PORT-A-CATH REMOVAL     PORTACATH PLACEMENT   07/01/2011   Procedure: INSERTION PORT-A-CATH;  Surgeon: Elspeth KYM Schultze, MD;  Location: [facility];  Service: General;  Laterality: Left;  Insertion of Port-A-Catheter Left Internal Jugular   RIGHT/LEFT HEART CATH AND CORONARY ANGIOGRAPHY N/A 12/08/2020   Procedure: RIGHT/LEFT HEART CATH AND CORONARY ANGIOGRAPHY;  Surgeon: Rolan Ezra RAMAN, MD;  Location: [facility] IN[facility] CV [facility];  Service: Cardiovascular;  Laterality: N/A;   TONSILLECTOMY  1957 - approximate   Family History  Problem Relation Age of Onset   Breast cancer Mother    Pancreatitis Mother        intestinal adhesions   Insomnia Mother    Colon polyps Father    Lung cancer Father    Diabetes Father    Prostate cancer Father    Colon cancer Neg Hx    Rectal cancer Neg Hx    Esophageal cancer Neg Hx    Inflammatory bowel disease Neg Hx    Liver disease Neg Hx    Pancreatic cancer Neg Hx    Stomach cancer Neg Hx    Social History   Occupational History   Occupation: Retired    Comment: airline pilot  Tobacco Use   Smoking status: Former    Current packs/day: 0.00    Average packs/day: 1 pack/day for 15.0 years (15.0 ttl pk-yrs)    Types: Cigarettes    Start date: 04/18/1978    Quit date: 04/18/1993    Years since quitting: 31.0   Smokeless tobacco: Never   Tobacco comments:    marijuana every night   Vaping Use   Vaping status: Never Used  Substance and Sexual Activity   Alcohol use: Not Currently    Alcohol/week: 5.0 standard drinks of alcohol    Types: 3 Cans of beer, 2 Shots of liquor per week    Comment: rare   Drug use: Not Currently    Types: Marijuana    Comment: once a night marijuana   Sexual activity: Not Currently   Tobacco Counseling Counseling given: Yes Tobacco comments: marijuana every night   SDOH Screenings   Food Insecurity: No Food Insecurity (05/07/2024)  Housing: Unknown (05/07/2024)  Transportation Needs: No Transportation Needs (05/07/2024)  Utilities: Not At Risk (05/07/2024)  Alcohol Screen: Low  Risk (02/24/2023)  Depression (PHQ2-9): Low Risk (05/07/2024)  Financial Resource Strain: Low Risk (02/24/2023)  Physical Activity: Sufficiently Active (05/07/2024)  Social Connections: Socially Isolated (05/07/2024)  Stress: No Stress Concern Present (05/07/2024)  Tobacco Use: Medium Risk (05/07/2024)  Health Literacy: Adequate Health Literacy (05/07/2024)   See flowsheets for full screening details  Depression Screen PHQ 2 & 9 Depression Scale- Over the past 2 weeks, how often have you been bothered by any of the following problems? Little interest or pleasure in doing things: 0 Feeling down, depressed, or hopeless (PHQ Adolescent also includes...irritable):  0 PHQ-2 Total Score: 0 Trouble falling or staying asleep, or sleeping too much: 2 Feeling tired or having little energy: 1 Poor appetite or overeating (PHQ Adolescent also includes...weight loss): 0 Feeling bad about yourself - or that you are a failure or have let yourself or your family down: 0 Trouble concentrating on things, such as reading the newspaper or watching television (PHQ Adolescent also includes...like school work): 1 Moving or speaking so slowly that other people could have noticed. Or the opposite - being so fidgety or restless that you have been moving around a lot more than usual: 0 Thoughts that you would be better off dead, or of hurting yourself in some way: 0 PHQ-9 Total Score: 4 If you checked off any problems, how difficult have these problems made it for you to do your work, take care of things at home, or get along with other people?: Somewhat difficult  Depression Treatment Depression Interventions/Treatment : Counseling     Goals Addressed             This Visit's Progress    Exercise 150 min/wk Moderate Activity   On track    Stay Active and Independent               Objective:    Today's Vitals   05/07/24 1035  BP: (!) 90/47  Pulse: 80  Temp: 98.3 F (36.8 C)  TempSrc: Oral  Weight:  191 lb (86.6 kg)  Height: 6' 2 (1.88 m)   Body mass index is 24.52 kg/m.  Hearing/Vision screen No results found. Immunizations and Health Maintenance Health Maintenance  Topic Date Due   COVID-19 Vaccine (4 - 2025-26 season) 12/18/2023   Hepatitis C Screening  09/19/2024 (Originally 02/16/1969)   Diabetic kidney evaluation - Urine ACR  05/18/2024   FOOT EXAM  09/19/2024   HEMOGLOBIN A1C  10/03/2024   Colonoscopy  03/21/2025   OPHTHALMOLOGY EXAM  03/26/2025   Diabetic kidney evaluation - eGFR measurement  04/04/2025   Medicare Annual Wellness (AWV)  05/07/2025   DTaP/Tdap/Td (2 - Td or Tdap) 07/22/2025   Pneumococcal Vaccine: 50+ Years  Completed   Influenza Vaccine  Completed   Zoster Vaccines- Shingrix   Completed   Meningococcal B Vaccine  Aged Out        Assessment/Plan:  This is a routine wellness examination for Greg Robertson.  Patient Care Team: Dettinger, Fonda LABOR, MD as PCP - General (Family Medicine) Rolan Ezra RAMAN, MD as PCP - Cardiology (Cardiology) Cloretta Arley NOVAK, MD as Consulting Physician (Oncology) Lavona Agent, MD as Consulting Physician (Cardiology) [facility] Teressa Toribio SQUIBB, MD (Inactive) as Attending Physician (Gastroenterology) Billee Mliss BIRCH, RPH-CPP (Pharmacist) Billee Mliss BIRCH, RPH-CPP as Pharmacist (Family Medicine) Rosemarie Eather RAMAN, MD as Referring Physician (Neurology)  I have personally reviewed and noted the following in the patients chart:   Medical and social history Use of alcohol, tobacco or illicit drugs  Current medications and supplements including opioid prescriptions. Functional ability and status Nutritional status Physical activity Advanced directives List of other physicians Hospitalizations, surgeries, and ER visits in previous 12 months Vitals Screenings to include cognitive, depression, and falls Referrals and appointments  No orders of the defined types were placed in this encounter.  In  addition, I have reviewed and discussed with patient certain preventive protocols, quality metrics, and best practice recommendations. A written personalized care plan for preventive services as well as general preventive health recommendations were provided to patient.   Greg Robertson,  CMA   05/07/2024   Return in 1 year (on 05/07/2025).  After Visit Summary: (In Person-Printed) AVS printed and given to the patient  Nurse Notes: pt declined in Covid vaccine "

## 2024-05-07 NOTE — Patient Instructions (Signed)
 Mr. Kattner,  Thank you for taking the time for your Medicare Wellness Visit. I appreciate your continued commitment to your health goals. Please review the care plan we discussed, and feel free to reach out if I can assist you further.  Please note that Annual Wellness Visits do not include a physical exam. Some assessments may be limited, especially if the visit was conducted virtually. If needed, we may recommend an in-person follow-up with your provider.  Ongoing Care Seeing your primary care provider every 3 to 6 months helps us  monitor your health and provide consistent, personalized care.   Referrals If a referral was made during today's visit and you haven't received any updates within two weeks, please contact the referred provider directly to check on the status.  Recommended Screenings:  Health Maintenance  Topic Date Due   COVID-19 Vaccine (4 - 2025-26 season) 12/18/2023   Medicare Annual Wellness Visit  02/24/2024   Hepatitis C Screening  09/19/2024*   Kidney health urinalysis for diabetes  05/18/2024   Complete foot exam   09/19/2024   Hemoglobin A1C  10/03/2024   Colon Cancer Screening  03/21/2025   Eye exam for diabetics  03/26/2025   Yearly kidney function blood test for diabetes  04/04/2025   DTaP/Tdap/Td vaccine (2 - Td or Tdap) 07/22/2025   Pneumococcal Vaccine for age over 1  Completed   Flu Shot  Completed   Zoster (Shingles) Vaccine  Completed   Meningitis B Vaccine  Aged Out  *Topic was postponed. The date shown is not the original due date.       05/07/2024   10:39 AM  Advanced Directives  Does Patient Have a Medical Advance Directive? No  Would patient like information on creating a medical advance directive? No - Patient declined    Vision: Annual vision screenings are recommended for early detection of glaucoma, cataracts, and diabetic retinopathy. These exams can also reveal signs of chronic conditions such as diabetes and high blood  pressure.  Dental: Annual dental screenings help detect early signs of oral cancer, gum disease, and other conditions linked to overall health, including heart disease and diabetes.  Please see the attached documents for additional preventive care recommendations.

## 2024-06-14 ENCOUNTER — Ambulatory Visit (HOSPITAL_COMMUNITY): Admitting: Cardiology

## 2024-07-04 ENCOUNTER — Ambulatory Visit: Admitting: Nurse Practitioner

## 2024-07-11 ENCOUNTER — Ambulatory Visit: Admitting: Family Medicine

## 2024-10-10 ENCOUNTER — Ambulatory Visit: Admitting: Oncology

## 2024-10-10 ENCOUNTER — Other Ambulatory Visit

## 2024-10-11 ENCOUNTER — Ambulatory Visit: Admitting: Oncology

## 2024-10-11 ENCOUNTER — Other Ambulatory Visit

## 2025-05-08 ENCOUNTER — Ambulatory Visit
